# Patient Record
Sex: Female | Born: 1975 | Race: Black or African American | Hispanic: No | Marital: Single | Smoking: Former smoker
Health system: Southern US, Community
[De-identification: ages and names within clinical notes are randomized; demographics above are authoritative.]

## PROBLEM LIST (undated history)

## (undated) DIAGNOSIS — D649 Anemia, unspecified: Secondary | ICD-10-CM

## (undated) DIAGNOSIS — E119 Type 2 diabetes mellitus without complications: Secondary | ICD-10-CM

## (undated) DIAGNOSIS — K219 Gastro-esophageal reflux disease without esophagitis: Secondary | ICD-10-CM

## (undated) DIAGNOSIS — I10 Essential (primary) hypertension: Secondary | ICD-10-CM

## (undated) DIAGNOSIS — M545 Low back pain, unspecified: Secondary | ICD-10-CM

## (undated) DIAGNOSIS — D519 Vitamin B12 deficiency anemia, unspecified: Secondary | ICD-10-CM

## (undated) DIAGNOSIS — M1710 Unilateral primary osteoarthritis, unspecified knee: Secondary | ICD-10-CM

## (undated) DIAGNOSIS — G931 Anoxic brain damage, not elsewhere classified: Secondary | ICD-10-CM

## (undated) DIAGNOSIS — Z95810 Presence of automatic (implantable) cardiac defibrillator: Secondary | ICD-10-CM

## (undated) DIAGNOSIS — Z9289 Personal history of other medical treatment: Secondary | ICD-10-CM

## (undated) DIAGNOSIS — G4730 Sleep apnea, unspecified: Secondary | ICD-10-CM

## (undated) DIAGNOSIS — J45909 Unspecified asthma, uncomplicated: Secondary | ICD-10-CM

## (undated) DIAGNOSIS — R519 Headache, unspecified: Secondary | ICD-10-CM

## (undated) DIAGNOSIS — M179 Osteoarthritis of knee, unspecified: Secondary | ICD-10-CM

## (undated) DIAGNOSIS — G8929 Other chronic pain: Secondary | ICD-10-CM

## (undated) DIAGNOSIS — G43909 Migraine, unspecified, not intractable, without status migrainosus: Secondary | ICD-10-CM

## (undated) DIAGNOSIS — R7989 Other specified abnormal findings of blood chemistry: Secondary | ICD-10-CM

## (undated) DIAGNOSIS — K760 Fatty (change of) liver, not elsewhere classified: Secondary | ICD-10-CM

## (undated) DIAGNOSIS — J42 Unspecified chronic bronchitis: Secondary | ICD-10-CM

## (undated) DIAGNOSIS — I469 Cardiac arrest, cause unspecified: Secondary | ICD-10-CM

## (undated) DIAGNOSIS — K4090 Unilateral inguinal hernia, without obstruction or gangrene, not specified as recurrent: Secondary | ICD-10-CM

## (undated) DIAGNOSIS — K222 Esophageal obstruction: Secondary | ICD-10-CM

## (undated) DIAGNOSIS — E538 Deficiency of other specified B group vitamins: Secondary | ICD-10-CM

## (undated) DIAGNOSIS — M1990 Unspecified osteoarthritis, unspecified site: Secondary | ICD-10-CM

## (undated) DIAGNOSIS — R51 Headache: Secondary | ICD-10-CM

## (undated) DIAGNOSIS — I480 Paroxysmal atrial fibrillation: Secondary | ICD-10-CM

## (undated) HISTORY — DX: Fatty (change of) liver, not elsewhere classified: K76.0

## (undated) HISTORY — PX: INGUINAL HERNIA REPAIR: SUR1180

## (undated) HISTORY — DX: Paroxysmal atrial fibrillation: I48.0

## (undated) HISTORY — DX: Unspecified osteoarthritis, unspecified site: M19.90

## (undated) HISTORY — PX: ABDOMINAL HYSTERECTOMY: SHX81

## (undated) HISTORY — PX: KNEE CARTILAGE SURGERY: SHX688

## (undated) HISTORY — DX: Low back pain: M54.5

## (undated) HISTORY — DX: Low back pain, unspecified: M54.50

---

## 2000-04-16 HISTORY — PX: TUBAL LIGATION: SHX77

## 2008-05-05 ENCOUNTER — Ambulatory Visit: Payer: Self-pay | Admitting: Diagnostic Radiology

## 2008-05-05 ENCOUNTER — Emergency Department (HOSPITAL_BASED_OUTPATIENT_CLINIC_OR_DEPARTMENT_OTHER): Admission: EM | Admit: 2008-05-05 | Discharge: 2008-05-05 | Payer: Self-pay | Admitting: Emergency Medicine

## 2008-08-02 ENCOUNTER — Emergency Department (HOSPITAL_BASED_OUTPATIENT_CLINIC_OR_DEPARTMENT_OTHER): Admission: EM | Admit: 2008-08-02 | Discharge: 2008-08-02 | Payer: Self-pay | Admitting: Emergency Medicine

## 2012-10-04 ENCOUNTER — Emergency Department (HOSPITAL_BASED_OUTPATIENT_CLINIC_OR_DEPARTMENT_OTHER)
Admission: EM | Admit: 2012-10-04 | Discharge: 2012-10-04 | Disposition: A | Discharge status: Home / Self Care | Payer: Self-pay | Attending: Emergency Medicine | Admitting: Emergency Medicine

## 2012-10-04 DIAGNOSIS — Y9389 Activity, other specified: Secondary | ICD-10-CM | POA: Insufficient documentation

## 2012-10-04 DIAGNOSIS — R0789 Other chest pain: Secondary | ICD-10-CM | POA: Insufficient documentation

## 2012-10-04 DIAGNOSIS — T782XXA Anaphylactic shock, unspecified, initial encounter: Secondary | ICD-10-CM | POA: Insufficient documentation

## 2012-10-04 DIAGNOSIS — J029 Acute pharyngitis, unspecified: Secondary | ICD-10-CM | POA: Insufficient documentation

## 2012-10-04 DIAGNOSIS — R000 Tachycardia, unspecified: Secondary | ICD-10-CM | POA: Insufficient documentation

## 2012-10-04 DIAGNOSIS — L299 Pruritus, unspecified: Secondary | ICD-10-CM | POA: Insufficient documentation

## 2012-10-04 DIAGNOSIS — T65891A Toxic effect of other specified substances, accidental (unintentional), initial encounter: Secondary | ICD-10-CM | POA: Insufficient documentation

## 2012-10-04 DIAGNOSIS — Y929 Unspecified place or not applicable: Secondary | ICD-10-CM | POA: Insufficient documentation

## 2012-10-04 DIAGNOSIS — R1310 Dysphagia, unspecified: Secondary | ICD-10-CM | POA: Insufficient documentation

## 2012-10-04 DIAGNOSIS — F411 Generalized anxiety disorder: Secondary | ICD-10-CM | POA: Insufficient documentation

## 2012-10-04 MED ORDER — FAMOTIDINE IN NACL 20-0.9 MG/50ML-% IV SOLN
INTRAVENOUS | Status: AC
  Administered 2012-10-04: 20 mg via INTRAVENOUS
  Filled 2012-10-04: qty 50

## 2012-10-04 MED ORDER — FAMOTIDINE IN NACL 20-0.9 MG/50ML-% IV SOLN
20.0000 mg | Freq: Once | INTRAVENOUS | Status: AC
  Administered 2012-10-04: 20 mg via INTRAVENOUS

## 2012-10-04 MED ORDER — MINERAL OIL RE ENEM
1.0000 | ENEMA | Freq: Once | RECTAL | Status: AC
  Administered 2012-10-04: 1 via RECTAL

## 2012-10-04 MED ORDER — PREDNISONE (PAK) 10 MG PO TABS: 10.0000 mg | ORAL_TABLET | Freq: Every day | ORAL | Status: DC

## 2012-10-04 MED ORDER — RANITIDINE HCL 150 MG PO TABS: 150.0000 mg | ORAL_TABLET | Freq: Two times a day (BID) | ORAL | Status: DC

## 2012-10-04 MED ORDER — EPINEPHRINE 0.3 MG/0.3ML IJ SOAJ: 0.3000 mg | INTRAMUSCULAR | Status: DC | PRN

## 2012-10-04 MED ORDER — MINERAL OIL RE ENEM
ENEMA | RECTAL | Status: AC
  Administered 2012-10-04: 1 via RECTAL
  Filled 2012-10-04: qty 1

## 2012-10-04 MED ORDER — HYDROXYZINE HCL 25 MG PO TABS
ORAL_TABLET | ORAL | Status: AC
  Administered 2012-10-04: 25 mg via ORAL
  Filled 2012-10-04: qty 1

## 2012-10-04 MED ORDER — EPINEPHRINE 0.3 MG/0.3ML IJ SOAJ
INTRAMUSCULAR | Status: AC
  Administered 2012-10-04: 0.3 mg via SUBCUTANEOUS
  Filled 2012-10-04: qty 0.3

## 2012-10-04 MED ORDER — HYDROXYZINE HCL 50 MG/ML IM SOLN
25.0000 mg | Freq: Once | INTRAMUSCULAR | Status: DC
  Filled 2012-10-04: qty 0.5

## 2012-10-04 MED ORDER — HYDROXYZINE HCL 25 MG PO TABS
25.0000 mg | ORAL_TABLET | Freq: Once | ORAL | Status: AC
  Administered 2012-10-04: 25 mg via ORAL

## 2012-10-04 MED ORDER — METHYLPREDNISOLONE SODIUM SUCC 125 MG IJ SOLR
INTRAMUSCULAR | Status: AC
  Administered 2012-10-04: 125 mg via INTRAVENOUS
  Filled 2012-10-04: qty 2

## 2012-10-04 MED ORDER — SODIUM CHLORIDE 0.9 % IV SOLN
Freq: Once | INTRAVENOUS | Status: AC
  Administered 2012-10-04: 18:00:00 via INTRAVENOUS

## 2012-10-04 MED ORDER — DIPHENHYDRAMINE HCL 50 MG/ML IJ SOLN
INTRAMUSCULAR | Status: AC
  Filled 2012-10-04: qty 1

## 2012-10-04 MED ORDER — METHYLPREDNISOLONE SODIUM SUCC 125 MG IJ SOLR
125.0000 mg | Freq: Once | INTRAMUSCULAR | Status: AC
  Administered 2012-10-04: 125 mg via INTRAVENOUS

## 2012-10-04 MED ORDER — DIPHENHYDRAMINE HCL 50 MG/ML IJ SOLN: 25.0000 mg | Freq: Once | INTRAMUSCULAR | Status: DC

## 2012-10-04 MED ORDER — DIPHENHYDRAMINE HCL 25 MG PO TABS: 25.0000 mg | ORAL_TABLET | Freq: Four times a day (QID) | ORAL | Status: DC

## 2012-10-04 NOTE — ED Provider Notes (Signed)
History     CSN: [id number]  Arrival date & time 10/04/12  50   First MD Initiated Contact with Patient 10/04/12 1725      Chief Complaint  Patient presents with  . Allergic Reaction    (Consider location/radiation/quality/duration/timing/severity/associated sxs/prior treatment) Patient is a 37 y.o. female presenting with allergic reaction. The history is provided by the patient.  Allergic Reaction Presenting symptoms: difficulty breathing, difficulty swallowing and itching   Presenting symptoms: no rash   Severity:  Moderate Prior allergic episodes:  No prior episodes Context: chemicals   Context comment:  Hair products Relieved by:  Nothing Worsened by:  Nothing tried Ineffective treatments:  Antihistamines  Courtney Davies is a 37 y.o. female who presents to the ED with shortness of breath and feeling like her throat was tight. 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 She took 3 Benadryl 25 mg without relief so she came here  No past medical history on file.  No past surgical history on file.  No family history on file.  History  Substance Use Topics  . Smoking status: Not on file  . Smokeless tobacco: Not on file  . Alcohol Use: Not on file    OB History   No data available      Review of Systems  Constitutional: Negative for fever.  HENT: Positive for sore throat (tight) and trouble swallowing.        Difficulty speaking  Respiratory: Positive for chest tightness and shortness of breath.   Gastrointestinal: Negative for nausea and vomiting.  Skin: Positive for itching. Negative for rash.  Neurological: Negative for syncope and headaches.  Psychiatric/Behavioral: The patient is nervous/anxious.     Allergies  Review of patient's allergies indicates not on file.  Home Medications  No current outpatient prescriptions on file.  BP 124/64  Pulse 121  Temp(Src) 98 F (36.7 C) (Oral)  Resp 30   SpO2 100%  Physical Exam  Nursing note and vitals reviewed. Constitutional: She is oriented to person, place, and time. She appears well-developed and well-nourished. She appears distressed. She is not intubated.  Mild respiratory distress.  HENT:  Head: Normocephalic.  Mouth/Throat: Uvula is midline, oropharynx is clear and moist and mucous membranes are normal.  Eyes: Conjunctivae and EOM are normal.  Neck: Normal range of motion. Neck supple.  Cardiovascular: Tachycardia present.   Pulmonary/Chest: Accessory muscle usage present. No apnea. She is not intubated. She is in respiratory distress. She has decreased breath sounds.  Musculoskeletal: Normal range of motion.  Neurological: She is alert and oriented to person, place, and time. No cranial nerve deficit.  Skin: Skin is warm and dry.  Psychiatric: Her mood appears anxious. She is noncommunicative.  Patient with difficulty speaking due to shortness of breath and severe allergic reaction.    ED Course  CRITICAL CARE Performed by: Ashley Murrain Authorized by: Ashley Murrain Total critical care time: 45 minutes Critical care was necessary to treat or prevent imminent or life-threatening deterioration of the following conditions: severe allergic reaction to hair dye requiring injecstion of epinephrine, IV Solumedro and Pepcid. also atarax PO, serial evaluations by Dr. Monia Pouch and me.  Critical care was time spent personally by me on the following activities: examination of patient, obtaining history from patient or surrogate, ordering and performing treatments and interventions, pulse oximetry, re-evaluation of patient's condition and evaluation of patient's response to treatment.   (including critical  care time) IV Solumedrol 125 mg., Pepcid 20 mg. IV and Epinephrine 0.3 ccm SQ, continuous O2 sat and vital signs.  17:35 re evaluation some improvement after medications, continue to observe 17:55 patient continues to have itching  will give Atarax PO 18:40 patient feeling much better and states she is ready to go home.  Re examined. No wheezing, no shortness of breath, no tachycardia.  Will d/c patient home with detailed instructions.   MDM  37 y.o. 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 Discussed in detail with the patient severe allergic reaction and plan of care. She voices understanding. She will return immediately for any respiratory problems.    Medication List    TAKE these medications       diphenhydrAMINE 25 MG tablet  Commonly known as:  BENADRYL  Take 1 tablet (25 mg total) by mouth every 6 (six) hours.     EPINEPHrine 0.3 mg/0.3 mL Devi  Commonly known as:  EPIPEN  Inject 0.3 mLs (0.3 mg total) into the muscle as needed.     predniSONE 10 MG tablet  Commonly known as:  STERAPRED UNI-PAK  Take 1 tablet (10 mg total) by mouth daily. Take 6 tablets today then 5, 4, 3, 2, 1 for allergic reaction     ranitidine 150 MG tablet  Commonly known as:  ZANTAC  Take 1 tablet (150 mg total) by mouth 2 (two) times daily.               [place], [place] 10/04/12 1914

## 2012-10-04 NOTE — ED Notes (Signed)
Presents with all over itching and "throat tight" x 30 mins- pt was getting her hair done- took 3 benadryl pta

## 2012-10-05 NOTE — ED Provider Notes (Signed)
Medical screening examination/treatment/procedure(s) were conducted as a shared visit with non-physician practitioner(s) and myself.  I personally evaluated the patient during the encounter Pt had an allergic reaction to a chemical in the hair permanent treatment she got at the beauty shop, developing severe itching, skin redness, swelling of the face and lips, and wheezing.  She had takne 75 mg of diphenhydramine prior to coming in, without relief.  Rx with epinephrine, SoluMedrol, Pepcid, and hydroxyzine, with resolution of her symptoms at appx 90 minutes after her arrival.  Mylinda Latina III, MD 10/05/12 1329

## 2012-12-14 ENCOUNTER — Emergency Department (HOSPITAL_BASED_OUTPATIENT_CLINIC_OR_DEPARTMENT_OTHER)
Admission: EM | Admit: 2012-12-14 | Discharge: 2012-12-14 | Disposition: A | Discharge status: Home / Self Care | Payer: Self-pay | Attending: Emergency Medicine | Admitting: Emergency Medicine

## 2012-12-14 ENCOUNTER — Emergency Department (HOSPITAL_BASED_OUTPATIENT_CLINIC_OR_DEPARTMENT_OTHER): Payer: Self-pay

## 2012-12-14 ENCOUNTER — Encounter (HOSPITAL_BASED_OUTPATIENT_CLINIC_OR_DEPARTMENT_OTHER): Payer: Self-pay

## 2012-12-14 DIAGNOSIS — S93499A Sprain of other ligament of unspecified ankle, initial encounter: Secondary | ICD-10-CM | POA: Insufficient documentation

## 2012-12-14 DIAGNOSIS — F17200 Nicotine dependence, unspecified, uncomplicated: Secondary | ICD-10-CM | POA: Insufficient documentation

## 2012-12-14 DIAGNOSIS — IMO0002 Reserved for concepts with insufficient information to code with codable children: Secondary | ICD-10-CM | POA: Insufficient documentation

## 2012-12-14 DIAGNOSIS — X500XXA Overexertion from strenuous movement or load, initial encounter: Secondary | ICD-10-CM | POA: Insufficient documentation

## 2012-12-14 DIAGNOSIS — Z79899 Other long term (current) drug therapy: Secondary | ICD-10-CM | POA: Insufficient documentation

## 2012-12-14 DIAGNOSIS — Z791 Long term (current) use of non-steroidal anti-inflammatories (NSAID): Secondary | ICD-10-CM | POA: Insufficient documentation

## 2012-12-14 DIAGNOSIS — S86319A Strain of muscle(s) and tendon(s) of peroneal muscle group at lower leg level, unspecified leg, initial encounter: Secondary | ICD-10-CM

## 2012-12-14 DIAGNOSIS — Y9301 Activity, walking, marching and hiking: Secondary | ICD-10-CM | POA: Insufficient documentation

## 2012-12-14 DIAGNOSIS — Y929 Unspecified place or not applicable: Secondary | ICD-10-CM | POA: Insufficient documentation

## 2012-12-14 MED ORDER — NAPROXEN 500 MG PO TABS: 500.0000 mg | ORAL_TABLET | Freq: Two times a day (BID) | ORAL | Status: DC

## 2012-12-14 MED ORDER — HYDROCODONE-ACETAMINOPHEN 5-325 MG PO TABS: 2.0000 | ORAL_TABLET | ORAL | Status: DC | PRN

## 2012-12-14 NOTE — ED Notes (Signed)
Sudden onset of right left pain from knee down to ankle. Was walking when pain occurred, "heard something popped". Pain 7/10 tight, aggravate by movement, relieve with sitting. Limited ROM on right leg, Very tender and painful to touch at the knee, calf and right ankle. No relief by ibuprofen and muscle relaxer (type unknown). Right leg do appear swollen compared to left.

## 2012-12-14 NOTE — ED Provider Notes (Signed)
CSN: [id number]     Arrival date & time 12/14/12  9476 History   First MD Initiated Contact with Patient 12/14/12 1006     Chief Complaint  Patient presents with  . Leg Pain   Patient was simply walking on a flat surface. She felt a sudden pain in the posterior aspect of her right calf. She heard a loud snap and she is able to walk. She can flex and extend her ankle. She is painful and tender in the proximal calf HPI  History reviewed. No pertinent past medical history. Past Surgical History  Procedure Laterality Date  . Cesarean section     No family history on file. History  Substance Use Topics  . Smoking status: Current Every Day Smoker  . Smokeless tobacco: Not on file  . Alcohol Use: No   OB History   Grav Para Term Preterm Abortions TAB SAB Ect Mult Living                 Review of Systems  Musculoskeletal:       Pain and tenderness in the right calf.  No pain at the Achilles    Allergies  Codeine  Home Medications   Current Outpatient Rx  Name  Route  Sig  Dispense  Refill  . diphenhydrAMINE (BENADRYL) 25 MG tablet   Oral   Take 1 tablet (25 mg total) by mouth every 6 (six) hours.   20 tablet   0   . EPINEPHrine (EPIPEN) 0.3 mg/0.3 mL DEVI   Intramuscular   Inject 0.3 mLs (0.3 mg total) into the muscle as needed.   1 Device   0   . HYDROcodone-acetaminophen (NORCO/VICODIN) 5-325 MG per tablet   Oral   Take 2 tablets by mouth every 4 (four) hours as needed for pain.   10 tablet   0   . naproxen (NAPROSYN) 500 MG tablet   Oral   Take 1 tablet (500 mg total) by mouth 2 (two) times daily.   30 tablet   0   . predniSONE (STERAPRED UNI-PAK) 10 MG tablet   Oral   Take 1 tablet (10 mg total) by mouth daily. Take 6 tablets today then 5, 4, 3, 2, 1 for allergic reaction   21 tablet   0   . ranitidine (ZANTAC) 150 MG tablet   Oral   Take 1 tablet (150 mg total) by mouth 2 (two) times daily.   60 tablet   0    BP 123/72  Pulse 87  Temp(Src)  95.5 F (35.3 C) (Oral)  Resp 18  SpO2 99%  LMP 12/03/2012 Physical Exam  Musculoskeletal:  Her plantarflexion and dorsiflexion of the right ankle are intact. She has some tenderness in the proximal posterior calf. Her perfusion distally is intact. She has no swelling. She has pain with passive dorsiflexion at the ankle. This pain reproduces at the proximal right calf musculature.    ED Course  Procedures (including critical care time) Labs Review Labs Reviewed - No data to display Imaging Review Dg Ankle Complete Right  12/14/2012   *RADIOLOGY REPORT*  Clinical Data: Right ankle pain  RIGHT ANKLE - COMPLETE 3+ VIEW  Comparison: None.  Findings: No acute fracture or dislocation is noted.  No gross soft tissue abnormality is noted.  IMPRESSION: No acute abnormality noted.   Original Report Authenticated By: Inez Catalina, M.D.   Dg Knee Complete 4 Views Right  12/14/2012   *RADIOLOGY REPORT*  Clinical Data: Right  knee pain  RIGHT KNEE - COMPLETE 4+ VIEW  Comparison: None.  Findings: No acute fracture or dislocation is noted.  No gross soft tissue abnormality is seen.  IMPRESSION: No acute abnormality is noted.   Original Report Authenticated By: Inez Catalina, M.D.    MDM   1. Peroneal tendon rupture, initial encounter    Ice, crutches, primary care followup. Pain control, pain medications.    Lolita Patella, MD 12/14/12 [phone]

## 2012-12-19 ENCOUNTER — Encounter (HOSPITAL_BASED_OUTPATIENT_CLINIC_OR_DEPARTMENT_OTHER): Payer: Self-pay | Admitting: *Deleted

## 2012-12-19 ENCOUNTER — Emergency Department (HOSPITAL_BASED_OUTPATIENT_CLINIC_OR_DEPARTMENT_OTHER)
Admission: EM | Admit: 2012-12-19 | Discharge: 2012-12-19 | Disposition: A | Discharge status: Home / Self Care | Payer: Self-pay | Attending: Emergency Medicine | Admitting: Emergency Medicine

## 2012-12-19 DIAGNOSIS — H04301 Unspecified dacryocystitis of right lacrimal passage: Secondary | ICD-10-CM

## 2012-12-19 DIAGNOSIS — Z79899 Other long term (current) drug therapy: Secondary | ICD-10-CM | POA: Insufficient documentation

## 2012-12-19 DIAGNOSIS — H04309 Unspecified dacryocystitis of unspecified lacrimal passage: Secondary | ICD-10-CM | POA: Insufficient documentation

## 2012-12-19 DIAGNOSIS — F17200 Nicotine dependence, unspecified, uncomplicated: Secondary | ICD-10-CM | POA: Insufficient documentation

## 2012-12-19 MED ORDER — ERYTHROMYCIN 5 MG/GM OP OINT: TOPICAL_OINTMENT | OPHTHALMIC | Status: DC

## 2012-12-19 MED ORDER — ERYTHROMYCIN BASE 250 MG PO TABS: 250.0000 mg | ORAL_TABLET | Freq: Four times a day (QID) | ORAL | Status: DC

## 2012-12-19 MED ORDER — ERYTHROMYCIN 5 MG/GM OP OINT
TOPICAL_OINTMENT | Freq: Once | OPHTHALMIC | Status: AC
  Administered 2012-12-19: 0.5 via OPHTHALMIC
  Filled 2012-12-19: qty 3.5

## 2012-12-19 MED ORDER — NAPROXEN 500 MG PO TABS: 500.0000 mg | ORAL_TABLET | Freq: Two times a day (BID) | ORAL | Status: DC

## 2012-12-19 NOTE — ED Provider Notes (Signed)
CSN: [id number]     Arrival date & time 12/19/12  2005 History  First MD Initiated Contact with Patient 12/19/12 2042     Chief Complaint  Patient presents with  . Eye Pain   HPI Patient states she's had a bump in the inner aspect of her left eyelid neither medial canthus for about the last year. It has never really bother her. However, in the last week she started noticing increasing swelling. She's also had increasing tenderness noted some drainage. Patient denies any trouble with fevers. She denies blurred vision History reviewed. No pertinent past medical history. Past Surgical History  Procedure Laterality Date  . Cesarean section    . Hernia repair     History reviewed. No pertinent family history. History  Substance Use Topics  . Smoking status: Current Every Day Smoker -- 0.50 packs/day    Types: Cigarettes  . Smokeless tobacco: Not on file  . Alcohol Use: No   OB History   Grav Para Term Preterm Abortions TAB SAB Ect Mult Living                 Review of Systems  All other systems reviewed and are negative.    Allergies  Codeine  Home Medications   Current Outpatient Rx  Name  Route  Sig  Dispense  Refill  . diphenhydrAMINE (BENADRYL) 25 MG tablet   Oral   Take 1 tablet (25 mg total) by mouth every 6 (six) hours.   20 tablet   0   . EPINEPHrine (EPIPEN) 0.3 mg/0.3 mL DEVI   Intramuscular   Inject 0.3 mLs (0.3 mg total) into the muscle as needed.   1 Device   0   . erythromycin (E-MYCIN) 250 MG tablet   Oral   Take 1 tablet (250 mg total) by mouth 4 (four) times daily.   28 tablet   0   . erythromycin ophthalmic ointment      Place a 1/2 inch ribbon of ointment into the lower eyelid.   3.5 g   0   . naproxen (NAPROSYN) 500 MG tablet   Oral   Take 1 tablet (500 mg total) by mouth 2 (two) times daily.   30 tablet   0   . ranitidine (ZANTAC) 150 MG tablet   Oral   Take 1 tablet (150 mg total) by mouth 2 (two) times daily.   60 tablet   0    BP 122/73  Pulse 80  Temp(Src) 98.6 F (37 C) (Oral)  Resp 20  Ht 5' 7"  (1.702 m)  Wt 210 lb (95.255 kg)  BMI 32.88 kg/m2  SpO2 100%  LMP 12/03/2012 Physical Exam  Nursing note and vitals reviewed. Constitutional: She appears well-developed and well-nourished. No distress.  HENT:  Head: Normocephalic and atraumatic.  Right Ear: External ear normal.  Left Ear: External ear normal.  Eyes: Conjunctivae are normal. Right eye exhibits no discharge. Left eye exhibits no discharge. No scleral icterus.    Neck: Neck supple. No tracheal deviation present.  Cardiovascular: Normal rate.   Pulmonary/Chest: Effort normal. No stridor. No respiratory distress.  Musculoskeletal: She exhibits no edema.  Neurological: She is alert. Cranial nerve deficit: no gross deficits.  Skin: Skin is warm and dry. No rash noted.  Psychiatric: She has a normal mood and affect.    ED Course  Procedures (including critical care time) Labs Review Labs Reviewed - No data to display Imaging Review No results found.  MDM  1. Dacrocystitis, right    Patient appears to have an area swelling and tenderness. 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 She may have a component of dacryocystitis. Patient on antibiotic eye drops and have her followup doctor.    Kathalene Frames, MD 12/19/12 2121

## 2012-12-19 NOTE — ED Notes (Signed)
Pt c/o left eye " bump" x 2 days

## 2012-12-20 ENCOUNTER — Telehealth (HOSPITAL_COMMUNITY): Payer: Self-pay | Admitting: *Deleted

## 2013-08-02 ENCOUNTER — Emergency Department (HOSPITAL_BASED_OUTPATIENT_CLINIC_OR_DEPARTMENT_OTHER)
Admission: EM | Admit: 2013-08-02 | Discharge: 2013-08-02 | Disposition: A | Discharge status: Home / Self Care | Payer: Self-pay | Attending: Emergency Medicine | Admitting: Emergency Medicine

## 2013-08-02 ENCOUNTER — Encounter (HOSPITAL_BASED_OUTPATIENT_CLINIC_OR_DEPARTMENT_OTHER): Payer: Self-pay | Admitting: Emergency Medicine

## 2013-08-02 DIAGNOSIS — Z791 Long term (current) use of non-steroidal anti-inflammatories (NSAID): Secondary | ICD-10-CM | POA: Insufficient documentation

## 2013-08-02 DIAGNOSIS — F17200 Nicotine dependence, unspecified, uncomplicated: Secondary | ICD-10-CM | POA: Insufficient documentation

## 2013-08-02 DIAGNOSIS — J45909 Unspecified asthma, uncomplicated: Secondary | ICD-10-CM | POA: Insufficient documentation

## 2013-08-02 DIAGNOSIS — R002 Palpitations: Secondary | ICD-10-CM | POA: Insufficient documentation

## 2013-08-02 DIAGNOSIS — R197 Diarrhea, unspecified: Secondary | ICD-10-CM | POA: Insufficient documentation

## 2013-08-02 DIAGNOSIS — D649 Anemia, unspecified: Secondary | ICD-10-CM

## 2013-08-02 DIAGNOSIS — R638 Other symptoms and signs concerning food and fluid intake: Secondary | ICD-10-CM | POA: Insufficient documentation

## 2013-08-02 DIAGNOSIS — Z79899 Other long term (current) drug therapy: Secondary | ICD-10-CM | POA: Insufficient documentation

## 2013-08-02 DIAGNOSIS — D510 Vitamin B12 deficiency anemia due to intrinsic factor deficiency: Secondary | ICD-10-CM | POA: Insufficient documentation

## 2013-08-02 DIAGNOSIS — Z792 Long term (current) use of antibiotics: Secondary | ICD-10-CM | POA: Insufficient documentation

## 2013-08-02 HISTORY — DX: Unspecified asthma, uncomplicated: J45.909

## 2013-08-02 LAB — CBC WITH DIFFERENTIAL/PLATELET
BASOS PCT: 0 % (ref 0–1)
Basophils Absolute: 0 10*3/uL (ref 0.0–0.1)
EOS ABS: 0.2 10*3/uL (ref 0.0–0.7)
EOS PCT: 3 % (ref 0–5)
HEMATOCRIT: 25.9 % — AB (ref 36.0–46.0)
HEMOGLOBIN: 9 g/dL — AB (ref 12.0–15.0)
Lymphocytes Relative: 51 % — ABNORMAL HIGH (ref 12–46)
Lymphs Abs: 4.2 10*3/uL — ABNORMAL HIGH (ref 0.7–4.0)
MCH: 30.5 pg (ref 26.0–34.0)
MCHC: 34.7 g/dL (ref 30.0–36.0)
MCV: 87.8 fL (ref 78.0–100.0)
MONO ABS: 0.5 10*3/uL (ref 0.1–1.0)
MONOS PCT: 6 % (ref 3–12)
Neutro Abs: 3.4 10*3/uL (ref 1.7–7.7)
Neutrophils Relative %: 41 % — ABNORMAL LOW (ref 43–77)
Platelets: 267 10*3/uL (ref 150–400)
RBC: 2.95 MIL/uL — ABNORMAL LOW (ref 3.87–5.11)
RDW: 19.3 % — AB (ref 11.5–15.5)
WBC: 8.4 10*3/uL (ref 4.0–10.5)

## 2013-08-02 LAB — URINALYSIS, ROUTINE W REFLEX MICROSCOPIC
Bilirubin Urine: NEGATIVE
Glucose, UA: NEGATIVE mg/dL
Hgb urine dipstick: NEGATIVE
KETONES UR: NEGATIVE mg/dL
LEUKOCYTES UA: NEGATIVE
NITRITE: NEGATIVE
PH: 7 (ref 5.0–8.0)
Protein, ur: NEGATIVE mg/dL
SPECIFIC GRAVITY, URINE: 1.018 (ref 1.005–1.030)
Urobilinogen, UA: 1 mg/dL (ref 0.0–1.0)

## 2013-08-02 LAB — COMPREHENSIVE METABOLIC PANEL
ALBUMIN: 3.7 g/dL (ref 3.5–5.2)
ALT: 13 U/L (ref 0–35)
AST: 15 U/L (ref 0–37)
Alkaline Phosphatase: 73 U/L (ref 39–117)
BILIRUBIN TOTAL: 0.2 mg/dL — AB (ref 0.3–1.2)
BUN: 13 mg/dL (ref 6–23)
CALCIUM: 9.2 mg/dL (ref 8.4–10.5)
CO2: 21 mEq/L (ref 19–32)
CREATININE: 0.7 mg/dL (ref 0.50–1.10)
Chloride: 109 mEq/L (ref 96–112)
GFR calc Af Amer: >90 mL/min (ref >90)
GFR calc non Af Amer: >90 mL/min (ref >90)
Glucose, Bld: 97 mg/dL (ref 70–99)
Potassium: 3.8 mEq/L (ref 3.7–5.3)
Sodium: 142 mEq/L (ref 137–147)
TOTAL PROTEIN: 6.9 g/dL (ref 6.0–8.3)

## 2013-08-02 LAB — LIPASE, BLOOD: LIPASE: 37 U/L (ref 11–59)

## 2013-08-02 NOTE — ED Notes (Signed)
Pt reports feeling dizziness after feeling her heart beating in her chest

## 2013-08-02 NOTE — ED Provider Notes (Signed)
CSN: [id number]     Arrival date & time 08/02/13  2115 History  This chart was scribed for Shaune Pollack, MD by Zettie Pho, ED Scribe. This patient was seen in room MH07/MH07 and the patient's care was started at 9:23 PM.    Chief Complaint  Patient presents with  . Dizziness   Patient is a 38 y.o. female presenting with palpitations. The history is provided by the patient. No language interpreter was used.  Palpitations Palpitations quality:  Fast Timing:  Intermittent Progression:  Unchanged Chronicity:  New Context: nicotine   Relieved by: ibuprofen. Worsened by:  Nothing tried Associated symptoms: dizziness   Associated symptoms: no vomiting    HPI Comments: DIANEY SUCHY is a 38 y.o. female who presents to the Emergency Department complaining of intermittent palpitations, described as "beating really hard," with associated dizziness and lightheadedness onset earlier today. She reports taking ibuprofen at home with mild, temporary relief of her symptoms. Patient reports some watery diarrhea with associated decreased appetite over the past few days. She denies vomiting. Patient has an allergy to codeine. Patient has a history of asthma, but denies daily medication use. Patient is a current every day smoker, 0.5 PPD, but does not drink. She denies the possibility of being pregnant.   Past Medical History  Diagnosis Date  . Asthma    Past Surgical History  Procedure Laterality Date  . Cesarean section    . Hernia repair     History reviewed. No pertinent family history. History  Substance Use Topics  . Smoking status: Current Every Day Smoker -- 0.50 packs/day    Types: Cigarettes  . Smokeless tobacco: Never Used  . Alcohol Use: No   OB History   Grav Para Term Preterm Abortions TAB SAB Ect Mult Living                 Review of Systems  Constitutional: Positive for appetite change (decreased).  Cardiovascular: Positive for palpitations.  Gastrointestinal: Positive  for diarrhea. Negative for vomiting.  Neurological: Positive for dizziness and light-headedness.  All other systems reviewed and are negative.   Allergies  Codeine  Home Medications   Prior to Admission medications   Medication Sig Start Date End Date Taking? Authorizing Provider  diphenhydrAMINE (BENADRYL) 25 MG tablet Take 1 tablet (25 mg total) by mouth every 6 (six) hours. 10/04/12   Hope Bunnie Pion, NP  EPINEPHrine (EPIPEN) 0.3 mg/0.3 mL DEVI Inject 0.3 mLs (0.3 mg total) into the muscle as needed. 10/04/12   Hope Bunnie Pion, NP  erythromycin (E-MYCIN) 250 MG tablet Take 1 tablet (250 mg total) by mouth 4 (four) times daily. 12/19/12   Kathalene Frames, MD  erythromycin ophthalmic ointment Place a 1/2 inch ribbon of ointment into the lower eyelid. 12/19/12   Kathalene Frames, MD  naproxen (NAPROSYN) 500 MG tablet Take 1 tablet (500 mg total) by mouth 2 (two) times daily. 12/19/12   Kathalene Frames, MD  ranitidine (ZANTAC) 150 MG tablet Take 1 tablet (150 mg total) by mouth 2 (two) times daily. 10/04/12   Hope Bunnie Pion, NP   Triage Vitals: BP 150/80  Pulse 86  Temp(Src) 98.3 F (36.8 C)  Resp 18  SpO2 100%  LMP 12/03/2012  Physical Exam  Nursing note and vitals reviewed. Constitutional: She is oriented to person, place, and time. She appears well-developed and well-nourished. No distress.  HENT:  Head: Normocephalic and atraumatic.  Eyes: Conjunctivae are normal.  Neck: Normal range  of motion. Neck supple.  Cardiovascular: Normal rate, regular rhythm and normal heart sounds.   Pulmonary/Chest: Effort normal. No respiratory distress.  Few, scattered rhonchi.   Abdominal: She exhibits no distension.  Musculoskeletal: Normal range of motion.  Neurological: She is alert and oriented to person, place, and time.  Skin: Skin is warm and dry.  Psychiatric: She has a normal mood and affect. Her behavior is normal.    ED Course  Procedures (including critical care time)  DIAGNOSTIC STUDIES: Oxygen  Saturation is 100% on room air, normal by my interpretation.    COORDINATION OF CARE: 9:27 PM- Will order an EKG, CBC, CMP, lipase, and UA. Discussed treatment plan with patient at bedside and patient verbalized agreement.     Labs Review Labs Reviewed  CBC WITH DIFFERENTIAL - Abnormal; Notable for the following:    RBC 2.95 (*)    Hemoglobin 9.0 (*)    HCT 25.9 (*)    RDW 19.3 (*)    Neutrophils Relative % 41 (*)    Lymphocytes Relative 51 (*)    Lymphs Abs 4.2 (*)    All other components within normal limits  COMPREHENSIVE METABOLIC PANEL - Abnormal; Notable for the following:    Total Bilirubin 0.2 (*)    All other components within normal limits  LIPASE, BLOOD  URINALYSIS, ROUTINE W REFLEX MICROSCOPIC    Imaging Review No results found.   EKG Interpretation   Date/Time:  Sunday August 02 2013 21:31:24 EDT Ventricular Rate:  83 PR Interval:  152 QRS Duration: 78 QT Interval:  368 QTC Calculation: 432 R Axis:   74 Text Interpretation:  Sinus rhythm with marked sinus arrhythmia with  occasional Premature ventricular complexes Poor R wave progression  Abnormal ECG Confirmed by Camila Maita MD, Andee Poles ([id number]) on 08/02/2013 10:04:32  PM      MDM   Final diagnoses:  None    38 y.o. Female with history of pernicious anemia, not currently being treated, but with history of need for transfusion presents with weakness and palpitations.  She may have some volume depletion by orthostatic vital signs and is encouraged to increase po.  Hgb is low at 9 but unlikely to be causing significant symptoms- she reports no history of bleeding, she is on depo, and the anemia is likely long standing.  She is advised regarding need for follow up and return precautions and voices understanding.   I personally performed the services described in this documentation, which was scribed in my presence. The recorded information has been reviewed and considered.     Shaune Pollack, MD 08/02/13  2229

## 2013-08-02 NOTE — Discharge Instructions (Signed)
Emergency Department Resource Guide 1) Find a Doctor and Pay Out of Pocket Although you won't have to find out who is covered by your insurance plan, it is a good idea to ask around and get recommendations. You will then need to call the office and see if the doctor you have chosen will accept you as a new patient and what types of options they offer for patients who are self-pay. Some doctors offer discounts or will set up payment plans for their patients who do not have insurance, but you will need to ask so you aren't surprised when you get to your appointment.  2) Contact Your Local Health Department Not all health departments have doctors that can see patients for sick visits, but many do, so it is worth a call to see if yours does. If you don't know where your local health department is, you can check in your phone book. The CDC also has a tool to help you locate your state's health department, and many state websites also have listings of all of their local health departments.  3) Find a [facility] If your illness is not likely to be very severe or complicated, you may want to try a walk in clinic. These are popping up all over the country in pharmacies, drugstores, and shopping centers. They're usually staffed by nurse practitioners or physician assistants that have been trained to treat common illnesses and complaints. They're usually fairly quick and inexpensive. However, if you have serious medical issues or chronic medical problems, these are probably not your best option.  No Primary Care Doctor: - Call Health Connect at  [phone] - they can help you locate a primary care doctor that  accepts your insurance, provides certain services, etc. - Physician Referral Service- [phone]  Chronic Pain Problems: Organization         Address  Phone   Notes  [facility]  [phone] Patients need to be referred by their primary care doctor.   Medication  Assistance: Organization         Address  Phone   Notes  [facility] Medication [facility] [address] [place], [place] [address] [phone] --Must be a resident of [facility] -- Must have NO insurance coverage whatsoever (no Medicaid/ Medicare, etc.) -- The pt. MUST have a primary care doctor that directs their care regularly and follows them in the community   [facility]  [phone]   [employer]  [phone]    Agencies that provide inexpensive medical care: Organization         Address  Phone   Notes  [place]  [phone]   [facility]    [phone]   [facility] [place], [place] [address] [phone]   [place] [address] [place] [phone]   Planned Parenthood    [phone]   [facility]    [phone]   [place] and [place] [address], [place] Phone:  [phone], Fax:  [phone] Hours of Operation:  9 am - 6 pm, M-F.  Also accepts Medicaid/Medicare and self-pay.  [facility] for [place], [address], [place] Phone: [phone], Fax: [phone]. Hours of Operation:  8:30 am - 5:30 pm, M-F.  Also accepts Medicaid and self-pay.  [facility] [address], [place] Phone: [phone]   [place], [place], [place] [phone], Ext. [phone] Mondays & Thursdays: 7-9 AM.  First 15 patients are seen on a first come, first serve basis.    [place] Providers:  Organization         Address  Phone   Notes  [facility] [address], [place] [phone] Also accepts self-pay patients.  [facility] [address], [place]  [phone]   [place], [address], [place]  [phone]   [facility] [address], [place] [phone]   Lucianne Lei [address], [place]   [phone] Only accepts [place] Access [place] patients after they have their name applied to their card.   Self-Pay (no insurance) in [facility]:  Organization         Address  Phone   Notes  Sickle Cell Patients, [facility] [place] [phone]   [facility] [place] [phone]   [facility] [place], [place], [place] [phone]   [facility]/Dr. Osei-Bonsu  [address] [place] or [address], [place] [phone] Phone number for both [place] and [place] locations is the same.  Urgent Medical and [facility] [address], [place] [phone]   [facility] [address], [place] or [address] [phone]   [facility] [address] [place] [phone], phone; [phone], fax Sees patients 1st and 3rd Saturday of every month.  Must not qualify for public or private insurance (i.e. Medicaid, Medicare, [place] Health Choice, Veterans' Benefits)  Household income should be no more than 200% of the poverty level The clinic cannot treat you if you are pregnant or think you are pregnant  Sexually transmitted diseases are not treated at the clinic.    Dental Care: Organization         Address  Phone  Notes  [facility] Department of [facility] [place] [phone] Accepts children up to age 72 who are enrolled in [place] or [place]; pregnant women with a Medicaid card; and children who have applied for Medicaid or [place] Health Choice, but were declined, whose parents can pay a reduced fee at time of service.  [facility]  Department of [facility]  [address], [place] [phone] Accepts children up to age 108 who are enrolled in [place] or [place]; pregnant women with a Medicaid card; and children who have applied for Medicaid or [place] Health Choice, but were declined, whose parents can pay a reduced fee at time of service.  [facility] Access PROGRAM  [place] [phone] Patients are seen by appointment only. Walk-ins are not accepted. [place] will see patients 66 years of age and older. Monday - Tuesday (8am-5pm) Most Wednesdays (8:30-5pm) $30 per visit, cash only  [facility] Adult Dental Access PROGRAM  [address], [facility] [phone] Patients are seen by appointment only. Walk-ins are not accepted. [place] will see patients 13 years of age and older. One  Wednesday Evening (Monthly: Volunteer Based).  $30 per visit, cash only  Center  [phone] for adults; Children under age 56, call Graduate Pediatric Dentistry at [phone]. Children aged 63-14, please call [phone] to request a pediatric application.  Dental services are provided in all areas of dental care including fillings, crowns and bridges, complete and partial dentures, implants, gum treatment, root canals, and extractions. Preventive care is also provided. Treatment is provided to both adults and children. Patients are selected via a lottery and there is often a waiting list.   [facility] [address], [place]  [phone] [email or web address]   [facility] [address] [place], [place] [phone], Ext. [phone] Second and Fourth Thursday of each month, opens at 6:30 AM; Clinic ends at 9 AM.  Patients are seen on a first-come first-served basis, and a limited number are seen during each clinic.   [facility]  [address] Hillard Danker [place], [place] [phone]    Eligibility Requirements You must have lived in [place], [place], or [place] counties for at least the last three months.   You cannot be eligible for state or federal sponsored [employer], including [employer], [place], or [employer].   You generally cannot be eligible for healthcare insurance through your employer.    How to apply: Eligibility screenings are held every Tuesday and Wednesday afternoon from 1:00 pm until 4:00 pm. You do not need an appointment for the interview!  [facility] [address], [place], [place]   [place]  [place] Department  [place]  [phone]    Behavioral Health Resources in the Community: Intensive Outpatient Programs Organization         Address  Phone  Notes  [place]. [address], [place] [phone]   [facility] [address] [place], [place]   ADS: [facility] [address] [place], [place]   [place] [address]  [place], [place] or [phone]   Substance Abuse Resources Organization         Address  Phone  Notes  Alcohol and Drug Services  [phone]   [place]  [phone]   [facility] [place]   Chinita Pester  [phone]   Residential & Outpatient Substance Abuse Program  [phone]   Psychological Services Organization         Address  Phone  Notes  [facility]  [place]  [phone]   [place] [address], [place] or [phone]    Mobile Crisis Teams Organization         Address  Phone  Notes  [facility], Mobile Crisis Care Unit  [phone]   [facility]  [address]  [place], [place]   [place] [address], [place] [phone]    Self-Help/Support Groups Organization         Address  Phone             Notes  [place]. of [place] - variety of support groups  [place] Call for more information  Narcotics Anonymous (NA), [facility] [address], [employer] [place]  2 meetings at this location  Residential Treatment Programs Organization         Address  Phone  Notes  [facility] [address],    [place]  [phone]   [facility]  [address] [place] [phone], [place], [place]   [place], [place] [phone] Admissions: 8am-3pm M-F  Incentives Substance [place] [address]    [place] [employer], [place] [phone]   [facility] [address] [place], [place], [place]   [facility] [address],  [place], [place]   [facility] - Intensive Outpatient [address], [place], [place]   [facility] ([place].,  [place], [place] [phone] or [phone]   [facility] Services (RTS) [address], [place], [place] Accepts Medicaid  [facility] [address],  [place] [phone] Substance Abuse/Addiction Treatment   [facility] Organization         Address  Phone  Notes  [facility]  [phone]   Domenic Schwab, PhD [address] Arlis Porta [place], [place]   [phone] or [phone]   [place] Leon Rogers [place], [place] [phone]   [facility] [address] [place], [place] [phone] Insurance/Medicaid/sponsorship through [facility] and Families [address] [place]                                    [place], [place] [phone] [place] [address], [place] [phone]    Dr. Adele Schilder  [phone]   [facility] Dept. 1) [address]  2) [place] 3)  [place] [address], [place] [phone]  [phone]   [place] [phone] or [phone] (After Hours)        Anemia, Nonspecific Anemia is a condition in which the concentration of red blood cells or hemoglobin in the blood is below normal. Hemoglobin is a substance in red blood cells that carries oxygen to the tissues of the body. Anemia results in not enough oxygen reaching these tissues.  CAUSES  Common causes of anemia include:   Excessive bleeding. Bleeding may be internal or external. This includes excessive bleeding from periods (in women) or from the intestine.   Poor nutrition.   Chronic kidney, thyroid, and liver disease.  Bone marrow disorders that decrease red blood cell production.  Cancer and treatments for cancer.  HIV, AIDS, and their treatments.  Spleen problems that increase red blood cell destruction.  Blood disorders.  Excess destruction of red blood cells due to infection, medicines, and autoimmune disorders. SIGNS AND SYMPTOMS   Minor weakness.   Dizziness.   Headache.  Palpitations.   Shortness of breath, especially with exercise.   Paleness.  Cold sensitivity.  Indigestion.  Nausea.  Difficulty sleeping.  Difficulty concentrating. Symptoms may occur suddenly or they may develop slowly.  DIAGNOSIS  Additional blood tests are often needed. These help your health care provider determine the best treatment. Your health care provider will check your stool for blood and look for other causes of blood loss.  TREATMENT  Treatment varies depending on  the cause of the anemia. Treatment can include:   Supplements of iron, vitamin K18, or folic  acid.   Hormone medicines.   A blood transfusion. This may be needed if blood loss is severe.   Hospitalization. This may be needed if there is significant continual blood loss.   Dietary changes.  Spleen removal. HOME CARE INSTRUCTIONS Keep all follow-up appointments. It often takes many weeks to correct anemia, and having your health care provider check on your condition and your response to treatment is very important. SEEK IMMEDIATE MEDICAL CARE IF:   You develop extreme weakness, shortness of breath, or chest pain.   You become dizzy or have trouble concentrating.  You develop heavy vaginal bleeding.   You develop a rash.   You have bloody or black, tarry stools.   You faint.   You vomit up blood.   You vomit repeatedly.   You have abdominal pain.  You have a fever or persistent symptoms for more than 2 3 days.   You have a fever and your symptoms suddenly get worse.   You are dehydrated.  MAKE SURE YOU:  Understand these instructions.  Will watch your condition.  Will get help right away if you are not doing well or get worse. Document Released: 05/10/2004 Document Revised: 12/03/2012 Document Reviewed: 09/26/2012 [facility] Patient Information 2014 [place]. Palpitations  A palpitation is the feeling that your heartbeat is irregular. It may feel like your heart is fluttering or skipping a beat. It may also feel like your heart is beating faster than normal. This is usually not a serious problem. In some cases, you may need more medical tests. HOME CARE  Avoid:  Caffeine in coffee, tea, soft drinks, diet pills, and energy drinks.  Chocolate.  Alcohol.  Stop smoking if you smoke.  Reduce your stress and anxiety. Try:  A method that measures bodily functions so you can learn to control them (biofeedback).  Yoga.  Meditation.  Physical activity such as swimming, jogging, or walking.  Get plenty of rest and sleep. GET HELP  RIGHT AWAY IF:   You have chest pain.  You feel short of breath.  You have a very bad headache.  You feel dizzy or pass out (faint).  Your fast or irregular heartbeat continues after 24 hours.  Your palpitations occur more often. MAKE SURE YOU:   Understand these instructions.  Will watch your condition.  Will get help right away if you are not doing well or get worse. Document Released: 01/10/2008 Document Revised: 10/02/2011 Document Reviewed: 06/01/2011 [facility] Patient Information 2014 [place].

## 2014-02-21 ENCOUNTER — Emergency Department (HOSPITAL_BASED_OUTPATIENT_CLINIC_OR_DEPARTMENT_OTHER)
Admission: EM | Admit: 2014-02-21 | Discharge: 2014-02-21 | Disposition: A | Discharge status: Home / Self Care | Payer: Self-pay | Attending: Emergency Medicine | Admitting: Emergency Medicine

## 2014-02-21 ENCOUNTER — Encounter (HOSPITAL_BASED_OUTPATIENT_CLINIC_OR_DEPARTMENT_OTHER): Payer: Self-pay | Admitting: *Deleted

## 2014-02-21 DIAGNOSIS — Z3202 Encounter for pregnancy test, result negative: Secondary | ICD-10-CM | POA: Insufficient documentation

## 2014-02-21 DIAGNOSIS — J45909 Unspecified asthma, uncomplicated: Secondary | ICD-10-CM | POA: Insufficient documentation

## 2014-02-21 DIAGNOSIS — Z791 Long term (current) use of non-steroidal anti-inflammatories (NSAID): Secondary | ICD-10-CM | POA: Insufficient documentation

## 2014-02-21 DIAGNOSIS — Z87891 Personal history of nicotine dependence: Secondary | ICD-10-CM | POA: Insufficient documentation

## 2014-02-21 DIAGNOSIS — Z79899 Other long term (current) drug therapy: Secondary | ICD-10-CM | POA: Insufficient documentation

## 2014-02-21 DIAGNOSIS — M5431 Sciatica, right side: Secondary | ICD-10-CM | POA: Insufficient documentation

## 2014-02-21 DIAGNOSIS — Z792 Long term (current) use of antibiotics: Secondary | ICD-10-CM | POA: Insufficient documentation

## 2014-02-21 LAB — URINALYSIS, ROUTINE W REFLEX MICROSCOPIC
Bilirubin Urine: NEGATIVE
GLUCOSE, UA: NEGATIVE mg/dL
Ketones, ur: NEGATIVE mg/dL
LEUKOCYTES UA: NEGATIVE
Nitrite: NEGATIVE
PH: 7 (ref 5.0–8.0)
PROTEIN: NEGATIVE mg/dL
SPECIFIC GRAVITY, URINE: 1.017 (ref 1.005–1.030)
Urobilinogen, UA: 1 mg/dL (ref 0.0–1.0)

## 2014-02-21 LAB — URINE MICROSCOPIC-ADD ON

## 2014-02-21 LAB — PREGNANCY, URINE: Preg Test, Ur: NEGATIVE

## 2014-02-21 MED ORDER — CYCLOBENZAPRINE HCL 10 MG PO TABS
10.0000 mg | ORAL_TABLET | Freq: Once | ORAL | Status: AC
Start: 2014-02-21 — End: 2014-02-21
  Administered 2014-02-21: 10 mg via ORAL
  Filled 2014-02-21: qty 1

## 2014-02-21 MED ORDER — KETOROLAC TROMETHAMINE 60 MG/2ML IM SOLN
60.0000 mg | Freq: Once | INTRAMUSCULAR | Status: AC
  Administered 2014-02-21: 60 mg via INTRAMUSCULAR
  Filled 2014-02-21: qty 2

## 2014-02-21 MED ORDER — OXYCODONE-ACETAMINOPHEN 5-325 MG PO TABS: 1.0000 | ORAL_TABLET | ORAL | Status: DC | PRN

## 2014-02-21 MED ORDER — ONDANSETRON 4 MG PO TBDP
4.0000 mg | ORAL_TABLET | Freq: Once | ORAL | Status: AC
Start: 2014-02-21 — End: 2014-02-21
  Administered 2014-02-21: 4 mg via ORAL
  Filled 2014-02-21: qty 1

## 2014-02-21 MED ORDER — CYCLOBENZAPRINE HCL 10 MG PO TABS: 10.0000 mg | ORAL_TABLET | Freq: Two times a day (BID) | ORAL | Status: DC | PRN

## 2014-02-21 MED ORDER — PREDNISONE 20 MG PO TABS
40.0000 mg | ORAL_TABLET | Freq: Once | ORAL | Status: AC
  Administered 2014-02-21: 40 mg via ORAL
  Filled 2014-02-21: qty 2

## 2014-02-21 MED ORDER — PREDNISONE 10 MG PO TABS
20.0000 mg | ORAL_TABLET | Freq: Two times a day (BID) | ORAL | Status: DC
Start: 2014-02-21 — End: 2014-04-11

## 2014-02-21 MED ORDER — OXYCODONE-ACETAMINOPHEN 5-325 MG PO TABS
1.0000 | ORAL_TABLET | Freq: Once | ORAL | Status: AC
  Administered 2014-02-21: 1 via ORAL
  Filled 2014-02-21: qty 1

## 2014-02-21 NOTE — ED Provider Notes (Signed)
CSN: [id number]     Arrival date & time 02/21/14  1300 History   First MD Initiated Contact with Patient 02/21/14 1424     Chief Complaint  Patient presents with  . Back Pain     (Consider location/radiation/quality/duration/timing/severity/associated sxs/prior Treatment) Patient is a 38 y.o. female presenting with back pain. The history is provided by the patient.  Back Pain Location:  Lumbar spine Quality:  Aching Pain severity:  Moderate Onset quality:  Gradual Duration:  3 days Timing:  Constant Progression:  Worsening Chronicity:  New   Past Medical History  Diagnosis Date  . Asthma    Past Surgical History  Procedure Laterality Date  . Cesarean section    . Hernia repair     No family history on file. History  Substance Use Topics  . Smoking status: Former Smoker -- 0.00 packs/day    Quit date: 10/14/2013  . Smokeless tobacco: Never Used  . Alcohol Use: No   OB History    No data available     Review of Systems  Musculoskeletal: Positive for back pain.  all other systems negative    Allergies  Codeine  Home Medications   Prior to Admission medications   Medication Sig Start Date End Date Taking? Authorizing Provider  diphenhydrAMINE (BENADRYL) 25 MG tablet Take 1 tablet (25 mg total) by mouth every 6 (six) hours. 10/04/12  Yes Hope Bunnie Pion, NP  EPINEPHrine (EPIPEN) 0.3 mg/0.3 mL DEVI Inject 0.3 mLs (0.3 mg total) into the muscle as needed. 10/04/12  Yes Hope Bunnie Pion, NP  erythromycin (E-MYCIN) 250 MG tablet Take 1 tablet (250 mg total) by mouth 4 (four) times daily. 12/19/12   Dorie Rank, MD  erythromycin ophthalmic ointment Place a 1/2 inch ribbon of ointment into the lower eyelid. 12/19/12   Dorie Rank, MD  naproxen (NAPROSYN) 500 MG tablet Take 1 tablet (500 mg total) by mouth 2 (two) times daily. 12/19/12   Dorie Rank, MD  ranitidine (ZANTAC) 150 MG tablet Take 1 tablet (150 mg total) by mouth 2 (two) times daily. 10/04/12   Hope Bunnie Pion, NP   BP 132/70  mmHg  Pulse 95  Temp(Src) 99 F (37.2 C) (Oral)  Resp 24  Ht 5' 6"  (1.676 m)  Wt 201 lb (91.173 kg)  BMI 32.46 kg/m2  SpO2 100%  LMP 12/03/2012 Physical Exam  Constitutional: She is oriented to person, place, and time. She appears well-developed and well-nourished. No distress.  HENT:  Head: Normocephalic and atraumatic.  Right Ear: Tympanic membrane normal.  Left Ear: Tympanic membrane normal.  Nose: Nose normal.  Mouth/Throat: Uvula is midline, oropharynx is clear and moist and mucous membranes are normal.  Eyes: EOM are normal.  Neck: Normal range of motion. Neck supple.  Cardiovascular: Normal rate and regular rhythm.   Pulmonary/Chest: Effort normal. She has no wheezes. She has no rales.  Abdominal: Soft. Bowel sounds are normal. There is no tenderness.  Musculoskeletal: Normal range of motion.       Lumbar back: She exhibits tenderness, pain and spasm. She exhibits normal pulse.       Back:  Pain with palpation over the right sciatic nerve. Pedal pulses equal, adequate circulation, good touch sensation. Straight leg raises without difficulty.   Neurological: She is alert and oriented to person, place, and time. She has normal strength. No cranial nerve deficit or sensory deficit. Gait normal.  Reflex Scores:      Bicep reflexes are 2+ on the right side  and 2+ on the left side.      Brachioradialis reflexes are 2+ on the right side and 2+ on the left side.      Patellar reflexes are 2+ on the right side and 2+ on the left side.      Achilles reflexes are 2+ on the right side and 2+ on the left side. Skin: Skin is warm and dry.  Psychiatric: She has a normal mood and affect. Her behavior is normal.  Nursing note and vitals reviewed.   ED Course  Procedures (including critical care time) Labs Review Results for orders placed or performed during the hospital encounter of 02/21/14 (from the past 24 hour(s))  Pregnancy, urine     Status: None   Collection Time: 02/21/14   3:44 PM  Result Value Ref Range   Preg Test, Ur NEGATIVE NEGATIVE  Urinalysis, Routine w reflex microscopic     Status: Abnormal   Collection Time: 02/21/14  3:44 PM  Result Value Ref Range   Color, Urine YELLOW YELLOW   APPearance CLEAR CLEAR   Specific Gravity, Urine 1.017 1.005 - 1.030   pH 7.0 5.0 - 8.0   Glucose, UA NEGATIVE NEGATIVE mg/dL   Hgb urine dipstick TRACE (A) NEGATIVE   Bilirubin Urine NEGATIVE NEGATIVE   Ketones, ur NEGATIVE NEGATIVE mg/dL   Protein, ur NEGATIVE NEGATIVE mg/dL   Urobilinogen, UA 1.0 0.0 - 1.0 mg/dL   Nitrite NEGATIVE NEGATIVE   Leukocytes, UA NEGATIVE NEGATIVE  Urine microscopic-add on     Status: None   Collection Time: 02/21/14  3:44 PM  Result Value Ref Range   Squamous Epithelial / LPF RARE RARE   RBC / HPF 0-2 <3 RBC/hpf   Bacteria, UA RARE RARE    MDM  38 y.o. female with right sciatic nerve pain. Stable for discharge without neuro deficits. Will treat for pain and inflammation. She will follow up with ortho. Discussed with the patient and all questioned fully answered.    Medication List    TAKE these medications        cyclobenzaprine 10 MG tablet  Commonly known as:  FLEXERIL  Take 1 tablet (10 mg total) by mouth 2 (two) times daily as needed for muscle spasms.     oxyCODONE-acetaminophen 5-325 MG per tablet  Commonly known as:  ROXICET  Take 1-2 tablets by mouth every 4 (four) hours as needed for severe pain.     predniSONE 10 MG tablet  Commonly known as:  DELTASONE  Take 2 tablets (20 mg total) by mouth 2 (two) times daily with a meal.      ASK your doctor about these medications        diphenhydrAMINE 25 MG tablet  Commonly known as:  BENADRYL  Take 1 tablet (25 mg total) by mouth every 6 (six) hours.     EPINEPHrine 0.3 mg/0.3 mL Soaj injection  Commonly known as:  EPIPEN  Inject 0.3 mLs (0.3 mg total) into the muscle as needed.     erythromycin 250 MG tablet  Commonly known as:  E-MYCIN  Take 1 tablet (250 mg  total) by mouth 4 (four) times daily.     erythromycin ophthalmic ointment  Place a 1/2 inch ribbon of ointment into the lower eyelid.     naproxen 500 MG tablet  Commonly known as:  NAPROSYN  Take 1 tablet (500 mg total) by mouth 2 (two) times daily.     ranitidine 150 MG tablet  Commonly known as:  ZANTAC  Take 1 tablet (150 mg total) by mouth 2 (two) times daily.           [address] [place], NP 02/21/14 Susan Moore, MD 02/22/14 [phone]

## 2014-02-21 NOTE — ED Notes (Signed)
Reports back pian x 3 days- denies injury

## 2014-04-10 ENCOUNTER — Encounter (HOSPITAL_BASED_OUTPATIENT_CLINIC_OR_DEPARTMENT_OTHER): Payer: Self-pay | Admitting: *Deleted

## 2014-04-10 ENCOUNTER — Emergency Department (HOSPITAL_BASED_OUTPATIENT_CLINIC_OR_DEPARTMENT_OTHER): Payer: Self-pay

## 2014-04-10 ENCOUNTER — Inpatient Hospital Stay (HOSPITAL_BASED_OUTPATIENT_CLINIC_OR_DEPARTMENT_OTHER)
Admission: EM | Admit: 2014-04-10 | Discharge: 2014-04-14 | DRG: 812 | Disposition: A | Discharge status: Home / Self Care | Payer: Self-pay | Attending: Internal Medicine | Admitting: Internal Medicine

## 2014-04-10 DIAGNOSIS — R109 Unspecified abdominal pain: Secondary | ICD-10-CM

## 2014-04-10 DIAGNOSIS — D62 Acute posthemorrhagic anemia: Secondary | ICD-10-CM

## 2014-04-10 DIAGNOSIS — F17210 Nicotine dependence, cigarettes, uncomplicated: Secondary | ICD-10-CM | POA: Diagnosis present

## 2014-04-10 DIAGNOSIS — R600 Localized edema: Secondary | ICD-10-CM | POA: Insufficient documentation

## 2014-04-10 DIAGNOSIS — I340 Nonrheumatic mitral (valve) insufficiency: Secondary | ICD-10-CM | POA: Diagnosis present

## 2014-04-10 DIAGNOSIS — D649 Anemia, unspecified: Principal | ICD-10-CM | POA: Diagnosis present

## 2014-04-10 DIAGNOSIS — Z886 Allergy status to analgesic agent status: Secondary | ICD-10-CM

## 2014-04-10 DIAGNOSIS — M5116 Intervertebral disc disorders with radiculopathy, lumbar region: Secondary | ICD-10-CM | POA: Diagnosis present

## 2014-04-10 DIAGNOSIS — T39395A Adverse effect of other nonsteroidal anti-inflammatory drugs [NSAID], initial encounter: Secondary | ICD-10-CM | POA: Diagnosis present

## 2014-04-10 DIAGNOSIS — M5442 Lumbago with sciatica, left side: Secondary | ICD-10-CM | POA: Insufficient documentation

## 2014-04-10 DIAGNOSIS — R32 Unspecified urinary incontinence: Secondary | ICD-10-CM | POA: Diagnosis present

## 2014-04-10 DIAGNOSIS — K5900 Constipation, unspecified: Secondary | ICD-10-CM | POA: Diagnosis present

## 2014-04-10 DIAGNOSIS — M5440 Lumbago with sciatica, unspecified side: Secondary | ICD-10-CM | POA: Diagnosis present

## 2014-04-10 DIAGNOSIS — J45909 Unspecified asthma, uncomplicated: Secondary | ICD-10-CM | POA: Diagnosis present

## 2014-04-10 DIAGNOSIS — K30 Functional dyspepsia: Secondary | ICD-10-CM | POA: Diagnosis present

## 2014-04-10 DIAGNOSIS — K2970 Gastritis, unspecified, without bleeding: Secondary | ICD-10-CM | POA: Diagnosis present

## 2014-04-10 DIAGNOSIS — E538 Deficiency of other specified B group vitamins: Secondary | ICD-10-CM | POA: Diagnosis present

## 2014-04-10 LAB — COMPREHENSIVE METABOLIC PANEL
ALBUMIN: 4.1 g/dL (ref 3.5–5.2)
ALT: 36 U/L — AB (ref 0–35)
AST: 62 U/L — ABNORMAL HIGH (ref 0–37)
Alkaline Phosphatase: 51 U/L (ref 39–117)
Anion gap: 5 (ref 5–15)
BUN: 13 mg/dL (ref 6–23)
CALCIUM: 8.8 mg/dL (ref 8.4–10.5)
CO2: 21 mmol/L (ref 19–32)
Chloride: 111 mEq/L (ref 96–112)
Creatinine, Ser: 0.69 mg/dL (ref 0.50–1.10)
GFR calc non Af Amer: >90 mL/min (ref >90)
Glucose, Bld: 117 mg/dL — ABNORMAL HIGH (ref 70–99)
Potassium: 3.8 mmol/L (ref 3.5–5.1)
SODIUM: 137 mmol/L (ref 135–145)
TOTAL PROTEIN: 6.7 g/dL (ref 6.0–8.3)
Total Bilirubin: 0.6 mg/dL (ref 0.3–1.2)

## 2014-04-10 LAB — PROTIME-INR
INR: 1.3 (ref 0.00–1.49)
PROTHROMBIN TIME: 16.3 s — AB (ref 11.6–15.2)

## 2014-04-10 LAB — BRAIN NATRIURETIC PEPTIDE: B NATRIURETIC PEPTIDE 5: 50.7 pg/mL (ref 0.0–100.0)

## 2014-04-10 LAB — PREPARE RBC (CROSSMATCH)

## 2014-04-10 MED ORDER — PANTOPRAZOLE SODIUM 40 MG PO TBEC: 80.0000 mg | DELAYED_RELEASE_TABLET | Freq: Every day | ORAL | Status: DC

## 2014-04-10 MED ORDER — SODIUM CHLORIDE 0.9 % IV SOLN: Freq: Once | INTRAVENOUS | Status: DC

## 2014-04-10 MED ORDER — FAMOTIDINE 20 MG PO TABS: 20.0000 mg | ORAL_TABLET | Freq: Every day | ORAL | Status: DC

## 2014-04-10 MED ORDER — FUROSEMIDE 10 MG/ML IJ SOLN
20.0000 mg | Freq: Once | INTRAMUSCULAR | Status: AC
  Administered 2014-04-10: 20 mg via INTRAVENOUS
  Filled 2014-04-10: qty 2

## 2014-04-10 MED ORDER — OXYCODONE-ACETAMINOPHEN 5-325 MG PO TABS
1.0000 | ORAL_TABLET | ORAL | Status: DC | PRN
  Administered 2014-04-11 – 2014-04-12 (×3): 2 via ORAL
  Administered 2014-04-12: 1 via ORAL
  Administered 2014-04-12: 2 via ORAL
  Administered 2014-04-13: 1 via ORAL
  Administered 2014-04-13 – 2014-04-14 (×3): 2 via ORAL
  Filled 2014-04-10 (×4): qty 2
  Filled 2014-04-10: qty 1
  Filled 2014-04-10 (×4): qty 2

## 2014-04-10 MED ORDER — MORPHINE SULFATE 2 MG/ML IJ SOLN
2.0000 mg | INTRAMUSCULAR | Status: DC | PRN
  Administered 2014-04-14: 2 mg via INTRAMUSCULAR
  Filled 2014-04-10: qty 1

## 2014-04-10 MED ORDER — ONDANSETRON HCL 4 MG/2ML IJ SOLN
4.0000 mg | Freq: Once | INTRAMUSCULAR | Status: AC
  Administered 2014-04-10: 4 mg via INTRAVENOUS
  Filled 2014-04-10: qty 2

## 2014-04-10 MED ORDER — MORPHINE SULFATE 4 MG/ML IJ SOLN
4.0000 mg | Freq: Once | INTRAMUSCULAR | Status: AC
  Administered 2014-04-10: 4 mg via INTRAVENOUS
  Filled 2014-04-10: qty 1

## 2014-04-10 MED ORDER — SODIUM CHLORIDE 0.9 % IJ SOLN
3.0000 mL | Freq: Two times a day (BID) | INTRAMUSCULAR | Status: DC
  Administered 2014-04-10 – 2014-04-14 (×5): 3 mL via INTRAVENOUS

## 2014-04-10 MED ORDER — INFLUENZA VAC SPLIT QUAD 0.5 ML IM SUSY
0.5000 mL | PREFILLED_SYRINGE | INTRAMUSCULAR | Status: DC
  Filled 2014-04-10: qty 0.5

## 2014-04-10 NOTE — Progress Notes (Signed)
[facility] Brief Accept Note.   Called by Dr. Stark Jock at [facility] re: Courtney Davies.   She is 38yo.  Leg swelling, SOB X about 2 days, DOE X 1 month.  Further symptoms include left thigh pain for about a week with moderate back pain.  Hgb 5.4.  She is hemodynamically stable currently with a BP of 123/63 and a pulse of 88.  She reports a history of some anemia and B12 deficiency, but she has not been treated for that.  Last H/H in April was 9/25.  She has been accepted for transfer to [facility] hospital for further work up.    Gilles Chiquito, MD

## 2014-04-10 NOTE — H&P (Addendum)
[facility] History and Physical  KENYONA RENA SWN:[id number] DOB: [birth year] DOA: 04/10/2014  Referring physician: ED physician PCP: No PCP Per Patient   Chief Complaint: LE swelling and back pain  HPI:  Courtney Davies is a 38yo woman with PMH of anemia from B12 deficiency who presents with low back and LE swelling. Ms. Chirico notes that she began having back pain around Nov 1 with left leg sciatica.  About 2 weeks ago, she also developed bilateral LE swelling with tenderness and pain with walking.  She notes that the sciatica is intermittent and worse with walking, coughing or sneezing.  The pain has been significant and she presented to urgent care due to the pain around the time it started and was given Rx for steroids, percocet and flexeril.  She has also been taking aleve 2 563m tablets once a day alternating with 803mtablets of advil, 2 tablets.  She sometimes takes doses of these up to 2 times per day over the last few weeks.  This gives her some relief to the pain.  She has been having incontinence where she feels that she needs to go to the bathroom and if she does not go relatively soon, she has some dribbling.  She has not had any bowel incontinence or saddle anesthesia.  She reports no known trauma.  She denies any history of recent long travel or surgery, she does not have a history of heart disease or heart failure.    In the ED, she was found to have a profound anemia with Hgb of 5.4.  She notes that she has had this before, even worse.  The only thing they found at the time was B12 deficiency.  She has supposed to have been on supplementation but has not taken for at least 4 years.  She reports occasional dark stools over the last few weeks.  She does have dyspepsia with regurgitation, chest pain with lying down, coughing at night and vomiting due to the coughing.  She has not noted any blood in the emesis.    [facility]  [facility].    Assessment and Plan:  Symptomatic normocytic anemia: Patient with history of anemia and now with intake of large doses of NSAIDs.  She possible has an acute on chronic anemia with a vitamin deficiency at baseline and now with blood loss from a gastritis.  - Hemocult stool - PPI started PO as she is hemodynamically stable - H/H low, will give 2 units of blood and re-evaluate - Hold all NSAIDs - Marked schistocytes on smear, check LDH, retic, anemia panel, haptoglobin.  She does have mild AST/ALT elevation, Tbili normal.  She was not jaundiced on exam.  Will further evaluate for hemolytic anemia if any of these are concerning.     Bilateral edema of lower extremity: I am concerned for high output failure given severe anemia - Check TTE - She was given 1 dose of lasix in the ED, but will hold off on further for now - Her exam is not consistent with a DVT as she has bilateral pain and tenderness to palpation and no palpable cords.  If TTE normal, would consider LE ultrasound bilateral   Low back pain with left-sided sciatica - Patient has had persistent back pain for ~ 6 weeks now with limited relief with high dose NSAIDs, steroids and percocet - MRI of L spine while inpatient.    Radiological Exams on Admission:  Dg Chest 2 View  04/10/2014   CLINICAL DATA:  38 year old female with shortness of breath and lower extremity swelling for 2 weeks. Initial encounter.  EXAM: CHEST  2 VIEW  COMPARISON:  [facility] Chest radiographs 03/13/2011.  FINDINGS: Stable lung volumes, within normal limits. Normal cardiac size and mediastinal contours. Visualized tracheal air column is within normal limits. No pneumothorax, pulmonary edema, pleural effusion or confluent pulmonary opacity. Stable increased interstitial markings. No acute osseous abnormality identified.  IMPRESSION: No acute cardiopulmonary abnormality.   Electronically Signed   By: Lars Pinks M.D.   On: 04/10/2014  17:14   Code Status: Full Family Communication: Pt at bedside Disposition Plan: Admit for further evaluation     Review of Systems:  Constitutional: Negative for fever, chills and malaise/fatigue. Negative for diaphoresis.  HENT: Negative for hearing loss, ear pain, nosebleeds, congestion, sore throat Eyes: Negative for blurred vision, double vision, photophobia Respiratory: Negative for hemoptysis, sputum production, shortness of breath, wheezing and stridor.   Cardiovascular: + for leg swelling and pain. Negative for chest pain, palpitations, orthopnea Gastrointestinal: + for nausea, vomiting, abdominal pain (Lower quadrants), heartburn, possible melena. Negative for constipation, blood in stool   Genitourinary: + for stress incontinence, no bowel incontinence Negative for dysuria, frequency Musculoskeletal: + for back pain and leg pain. Negative for myalgias, joint pain and falls.  Skin: Negative for itching and rash.  Neurological: Negative for dizziness and weakness. Negative for tingling, tremors, sensory change, speech change, focal weakness, loss of consciousness and headaches.  Endo/Heme/Allergies: Negative for environmental allergies and polydipsia Psychiatric/Behavioral: The patient is not nervous/anxious.      Past Medical History  Diagnosis Date  . Asthma     as a child    Past Surgical History  Procedure Laterality Date  . Cesarean section      X 2  . Hernia repair      Social History:  reports that she has been smoking Cigarettes.  She has been smoking about 0.50 packs per day. She has never used smokeless tobacco. She reports that she uses illicit drugs. She reports that she does not drink alcohol.  Allergies  Allergen Reactions  . Codeine     Family History  Problem Relation Age of Onset  . Anemia      Dad's side  . Valvular heart disease      Dad's side   She is no longer taking erythromycin, prednisone and cannot afford ranitidine Prior to  Admission medications   Medication Sig Start Date End Date Taking? Authorizing Provider  cyclobenzaprine (FLEXERIL) 10 MG tablet Take 1 tablet (10 mg total) by mouth 2 (two) times daily as needed for muscle spasms. 02/21/14   Hope Bunnie Pion, NP  diphenhydrAMINE (BENADRYL) 25 MG tablet Take 1 tablet (25 mg total) by mouth every 6 (six) hours. 10/04/12   Hope Bunnie Pion, NP  EPINEPHrine (EPIPEN) 0.3 mg/0.3 mL DEVI Inject 0.3 mLs (0.3 mg total) into the muscle as needed. 10/04/12   Hope Bunnie Pion, NP  erythromycin (E-MYCIN) 250 MG tablet Take 1 tablet (250 mg total) by mouth 4 (four) times daily. 12/19/12   Dorie Rank, MD  erythromycin ophthalmic ointment Place a 1/2 inch ribbon of ointment into the lower eyelid. 12/19/12   Dorie Rank, MD  naproxen (NAPROSYN) 500 MG tablet Take 1 tablet (500 mg total) by mouth 2 (two) times daily. 12/19/12   Dorie Rank, MD  oxyCODONE-acetaminophen (ROXICET) 5-325 MG per tablet Take 1-2 tablets by  mouth every 4 (four) hours as needed for severe pain. 02/21/14   Hope Bunnie Pion, NP  predniSONE (DELTASONE) 10 MG tablet Take 2 tablets (20 mg total) by mouth 2 (two) times daily with a meal. 02/21/14   Hope Bunnie Pion, NP  ranitidine (ZANTAC) 150 MG tablet Take 1 tablet (150 mg total) by mouth 2 (two) times daily. 10/04/12   Hope Bunnie Pion, NP    Physical Exam: Filed Vitals:   04/10/14 1652 04/10/14 1732 04/10/14 1938 04/10/14 2052  BP: 110/56 123/63 117/43 108/50  Pulse:  88 75 78  Temp:   98 F (36.7 C) 99.6 F (37.6 C)  TempSrc:   Oral Oral  Resp:  18 18 16   Height:    5' 7"  (1.702 m)  Weight:    206 lb 12.8 oz (93.804 kg)  SpO2:  100% 100% 100%    Physical Exam  Constitutional: Appears well-developed and well-nourished. No distress.  HENT: Normocephalic. atruamatic Eyes: Conjunctivae normal. PERRLA, no scleral icterus.  Neck: Normal ROM. Neck supple.  CVS: RR, Normal rhythm, S1/S2 +, no murmurs Pulmonary: Effort and breath sounds normal, no rhonchi, wheezes, rales.  Abdominal: Soft.  BS +,  no distension, tenderness, rebound or guarding.  Musculoskeletal: Normal range of motion. Non pitting edema to bilateral LE, pitting to ankles.  Very tender to palpation, particularly at ankles.  Back is mildly tender in the midline in the L spine Neuro: Alert. Normal muscle tone and coordination. No cranial nerve deficit. Skin: Skin is warm and dry. She has fake nails on Psychiatric: Normal mood and affect. Behavior, judgment, thought content normal.   Labs on Admission:  Basic Metabolic Panel:  Recent Labs Lab 04/10/14 1630  NA 137  K 3.8  CL 111  CO2 21  GLUCOSE 117*  BUN 13  CREATININE 0.69  CALCIUM 8.8   Liver Function Tests:  Recent Labs Lab 04/10/14 1630  AST 62*  ALT 36*  ALKPHOS 51  BILITOT 0.6  PROT 6.7  ALBUMIN 4.1   CBC:  Recent Labs Lab 04/10/14 1630  WBC 7.7  NEUTROABS 4.4  HGB 5.4*  HCT 16.9*  MCV 85.4  PLT 213    EKG: Normal sinus rhythm, no acute ST/T wave changes  Gilles Chiquito, MD  [facility] Pager [phone]  If 7PM-7AM, please contact night-coverage [email or web address] Password [email or web address] 04/10/2014, 10:45 PM      \

## 2014-04-10 NOTE — ED Notes (Signed)
Pt c/o bil leg swelling and left thigh pain x 1 week

## 2014-04-10 NOTE — ED Notes (Addendum)
Attempted report to [facility] 5W 21 primary receiving RN

## 2014-04-10 NOTE — ED Notes (Signed)
EDP notified of critical HGB lab level.

## 2014-04-10 NOTE — Progress Notes (Signed)
Paged Dr. Daryll Drown for order for pt to travel off tele to MRI. Dr. Daryll Drown called back and said she would prefer to have the MRI done tomorrow after the pt receives blood.

## 2014-04-10 NOTE — ED Notes (Signed)
Up to b/r to void, admits to pain, ambulatory with steady gait, asks about smoking, no smoking explained, nicotine patch mentioned, updated with plan to transfer, pt aware. Alert, NAD, calm, interactive, no dyspnea noted. Family at [facility].

## 2014-04-10 NOTE — ED Provider Notes (Addendum)
CSN: [id number]     Arrival date & time 04/10/14  1403 History   This chart was scribed for Veryl Speak, MD by Jeanell Sparrow, ED Scribe. This patient was seen in room MH10/MH10 and the patient's care was started at 4:07 PM.    Chief Complaint  Patient presents with  . Leg Swelling   Patient is a 38 y.o. female presenting with general illness. The history is provided by the patient. No language interpreter was used.  Illness Location:  Bilateral lower extremities Severity:  Moderate Onset quality:  Sudden Duration:  2 days Timing:  Constant Progression:  Unchanged Chronicity:  New Relieved by:  None Worsened by:  None Ineffective treatments:  None Associated symptoms: myalgias   Associated symptoms: no chest pain and no shortness of breath    HPI Comments: DELITA CHIQUITO is a 38 y.o. female who presents to the Emergency Department complaining of constant moderate bilateral lower extremity swelling that started yesterday. She reports that she noticed yesterday that her legs were swollen and could not fit into her clothes and shoes. She states that she has constant moderate left thigh pain that has been going on for a week. She reports that she has been having constant moderate back pain that has been going for more than a week. She reports that ambulation exacerbates the pain. She states that she has no prior hx of leg pain and swelling. She denies any chest pain or SOB.   Past Medical History  Diagnosis Date  . Asthma    Past Surgical History  Procedure Laterality Date  . Cesarean section    . Hernia repair     History reviewed. No pertinent family history. History  Substance Use Topics  . Smoking status: Former Smoker -- 0.00 packs/day    Quit date: 10/14/2013  . Smokeless tobacco: Never Used  . Alcohol Use: No   OB History    No data available     Review of Systems  Respiratory: Negative for shortness of breath.   Cardiovascular: Positive for leg swelling. Negative  for chest pain.  Musculoskeletal: Positive for myalgias and back pain.  All other systems reviewed and are negative.   Allergies  Codeine  Home Medications   Prior to Admission medications   Medication Sig Start Date End Date Taking? Authorizing Provider  cyclobenzaprine (FLEXERIL) 10 MG tablet Take 1 tablet (10 mg total) by mouth 2 (two) times daily as needed for muscle spasms. 02/21/14   Hope Bunnie Pion, NP  diphenhydrAMINE (BENADRYL) 25 MG tablet Take 1 tablet (25 mg total) by mouth every 6 (six) hours. 10/04/12   Hope Bunnie Pion, NP  EPINEPHrine (EPIPEN) 0.3 mg/0.3 mL DEVI Inject 0.3 mLs (0.3 mg total) into the muscle as needed. 10/04/12   Hope Bunnie Pion, NP  erythromycin (E-MYCIN) 250 MG tablet Take 1 tablet (250 mg total) by mouth 4 (four) times daily. 12/19/12   Dorie Rank, MD  erythromycin ophthalmic ointment Place a 1/2 inch ribbon of ointment into the lower eyelid. 12/19/12   Dorie Rank, MD  naproxen (NAPROSYN) 500 MG tablet Take 1 tablet (500 mg total) by mouth 2 (two) times daily. 12/19/12   Dorie Rank, MD  oxyCODONE-acetaminophen (ROXICET) 5-325 MG per tablet Take 1-2 tablets by mouth every 4 (four) hours as needed for severe pain. 02/21/14   Hope Bunnie Pion, NP  predniSONE (DELTASONE) 10 MG tablet Take 2 tablets (20 mg total) by mouth 2 (two) times daily with a meal. 02/21/14  Hope Bunnie Pion, NP  ranitidine (ZANTAC) 150 MG tablet Take 1 tablet (150 mg total) by mouth 2 (two) times daily. 10/04/12   Hope Bunnie Pion, NP   BP 100/49 mmHg  Pulse 83  Temp(Src) 98.5 F (36.9 C)  Resp 18  Ht 5' 7"  (1.702 m)  Wt 197 lb (89.359 kg)  BMI 30.85 kg/m2  SpO2 98%  LMP 12/03/2012 Physical Exam  Constitutional: She is oriented to person, place, and time. She appears well-developed and well-nourished. No distress.  HENT:  Head: Normocephalic and atraumatic.  Neck: Neck supple. No tracheal deviation present.  Cardiovascular: Normal rate.   Pulmonary/Chest: Effort normal. No respiratory distress.   Musculoskeletal: Normal range of motion. She exhibits edema.  +2 BLE edema.   There is TTP in the left buttock.   Neurological: She is alert and oriented to person, place, and time.  DTRs are +1 and equal bilaterally. Strength is symmetrical in the lower extremities.    Skin: Skin is warm and dry.  Psychiatric: She has a normal mood and affect. Her behavior is normal.  Nursing note and vitals reviewed.   ED Course  Procedures (including critical care time) DIAGNOSTIC STUDIES: Oxygen Saturation is 98% on RA, normal by my interpretation.    COORDINATION OF CARE: 4:11 PM- Pt advised of plan for treatment which includes medication, radiology, and labs and pt agrees.  Labs Review Labs Reviewed - No data to display  Imaging Review No results found.   EKG Interpretation   Date/Time:  Saturday April 10 2014 16:33:16 EST Ventricular Rate:  78 PR Interval:  136 QRS Duration: 82 QT Interval:  386 QTC Calculation: 440 R Axis:   85 Text Interpretation:  Normal sinus rhythm Septal infarct , age  undetermined Abnormal ECG Confirmed by DELOS  MD, Alys Dulak ([id number]) on  04/10/2014 6:00:55 PM      MDM   Final diagnoses:  None    Patient is a 38 year old female who presents with leg swelling, weakness, and shortness of breath with exertion for the past month. Workup reveals a hemoglobin of 5.4. She is not reporting any bloody stool or melena and I suspect this is chronic in nature. She is hemodynamically stable. She tells me she has a history of B12 deficiency, however is not taking any vitamins or medications in many months. She has no primary doctor.  She is requesting to go to [facility]. I've discussed the case with Dr. Laveda Norman who is recommending the patient come through the emergency department due to her extreme anemia. I've spoken with Dr. Louie Bun who agrees to accept the patient.  I personally performed the services described in this documentation, which was scribed in  my presence. The recorded information has been reviewed and is accurate.       Veryl Speak, MD 04/10/14 1802  Shortly after arrangements were made for transfer to [facility], patient for me that she would prefer to go to [facility]. I've spoken with Dr. Daryll Drown from [facility] who agrees to admit.  Veryl Speak, MD 04/10/14 [phone]

## 2014-04-11 ENCOUNTER — Inpatient Hospital Stay (HOSPITAL_COMMUNITY): Payer: Self-pay

## 2014-04-11 DIAGNOSIS — M5442 Lumbago with sciatica, left side: Secondary | ICD-10-CM

## 2014-04-11 DIAGNOSIS — D649 Anemia, unspecified: Principal | ICD-10-CM

## 2014-04-11 LAB — CBC WITH DIFFERENTIAL/PLATELET
Basophils Absolute: 0 10*3/uL (ref 0.0–0.1)
Basophils Relative: 0 % (ref 0–1)
Eosinophils Absolute: 0.2 10*3/uL (ref 0.0–0.7)
Eosinophils Relative: 3 % (ref 0–5)
HEMATOCRIT: 16.9 % — AB (ref 36.0–46.0)
HEMOGLOBIN: 5.4 g/dL — AB (ref 12.0–15.0)
Lymphocytes Relative: 36 % (ref 12–46)
Lymphs Abs: 2.8 10*3/uL (ref 0.7–4.0)
MCH: 27.3 pg (ref 26.0–34.0)
MCHC: 32 g/dL (ref 30.0–36.0)
MCV: 85.4 fL (ref 78.0–100.0)
Monocytes Absolute: 0.3 10*3/uL (ref 0.1–1.0)
Monocytes Relative: 4 % (ref 3–12)
NEUTROS PCT: 57 % (ref 43–77)
Neutro Abs: 4.4 10*3/uL (ref 1.7–7.7)
Platelets: 213 10*3/uL (ref 150–400)
RBC: 1.98 MIL/uL — AB (ref 3.87–5.11)
RDW: 35.4 % — ABNORMAL HIGH (ref 11.5–15.5)
WBC: 7.7 10*3/uL (ref 4.0–10.5)

## 2014-04-11 LAB — CBC
HCT: 22.8 % — ABNORMAL LOW (ref 36.0–46.0)
Hemoglobin: 7.3 g/dL — ABNORMAL LOW (ref 12.0–15.0)
MCH: 26.4 pg (ref 26.0–34.0)
MCHC: 32 g/dL (ref 30.0–36.0)
MCV: 82.3 fL (ref 78.0–100.0)
PLATELETS: 154 10*3/uL (ref 150–400)
RBC: 2.77 MIL/uL — AB (ref 3.87–5.11)
RDW: 30.9 % — ABNORMAL HIGH (ref 11.5–15.5)
WBC: 6 10*3/uL (ref 4.0–10.5)

## 2014-04-11 LAB — VITAMIN B12: VITAMIN B 12: 62 pg/mL — AB (ref 211–911)

## 2014-04-11 LAB — FOLATE: Folate: 4.5 ng/mL

## 2014-04-11 LAB — IRON AND TIBC
Iron: 74 ug/dL (ref 42–145)
SATURATION RATIOS: 33 % (ref 20–55)
TIBC: 223 ug/dL — ABNORMAL LOW (ref 250–470)
UIBC: 149 ug/dL (ref 125–400)

## 2014-04-11 LAB — BILIRUBIN, FRACTIONATED(TOT/DIR/INDIR)
BILIRUBIN INDIRECT: 0.4 mg/dL (ref 0.3–0.9)
Bilirubin, Direct: 0.2 mg/dL (ref 0.0–0.3)
Total Bilirubin: 0.6 mg/dL (ref 0.3–1.2)

## 2014-04-11 LAB — RETICULOCYTES
RBC.: 1.91 MIL/uL — ABNORMAL LOW (ref 3.87–5.11)
RETIC CT PCT: 0.7 % (ref 0.4–3.1)
Retic Count, Absolute: 13.4 10*3/uL — ABNORMAL LOW (ref 19.0–186.0)

## 2014-04-11 LAB — FERRITIN: Ferritin: 466 ng/mL — ABNORMAL HIGH (ref 10–291)

## 2014-04-11 LAB — LACTATE DEHYDROGENASE: LDH: 3218 U/L — ABNORMAL HIGH (ref 94–250)

## 2014-04-11 LAB — HIV ANTIBODY (ROUTINE TESTING W REFLEX): HIV 1&2 Ab, 4th Generation: NONREACTIVE

## 2014-04-11 MED ORDER — CYCLOBENZAPRINE HCL 10 MG PO TABS
5.0000 mg | ORAL_TABLET | Freq: Three times a day (TID) | ORAL | Status: DC | PRN
Start: 2014-04-11 — End: 2014-04-14
  Administered 2014-04-13 – 2014-04-14 (×3): 5 mg via ORAL
  Filled 2014-04-11 (×3): qty 1

## 2014-04-11 MED ORDER — LORAZEPAM 2 MG/ML IJ SOLN
0.5000 mg | Freq: Once | INTRAMUSCULAR | Status: AC
  Administered 2014-04-11: 0.5 mg via INTRAVENOUS

## 2014-04-11 MED ORDER — POLYETHYLENE GLYCOL 3350 17 G PO PACK
17.0000 g | PACK | Freq: Two times a day (BID) | ORAL | Status: DC
Start: 2014-04-11 — End: 2014-04-14
  Administered 2014-04-11 – 2014-04-14 (×5): 17 g via ORAL
  Filled 2014-04-11 (×7): qty 1

## 2014-04-11 MED ORDER — LORAZEPAM 2 MG/ML IJ SOLN
INTRAMUSCULAR | Status: AC
  Filled 2014-04-11: qty 1

## 2014-04-11 MED ORDER — ONDANSETRON HCL 4 MG/2ML IJ SOLN
4.0000 mg | Freq: Four times a day (QID) | INTRAMUSCULAR | Status: DC | PRN
  Administered 2014-04-11: 4 mg via INTRAVENOUS
  Filled 2014-04-11: qty 2

## 2014-04-11 MED ORDER — PANTOPRAZOLE SODIUM 40 MG IV SOLR
40.0000 mg | Freq: Two times a day (BID) | INTRAVENOUS | Status: DC
  Administered 2014-04-11 (×2): 40 mg via INTRAVENOUS
  Filled 2014-04-11 (×4): qty 40

## 2014-04-11 MED ORDER — GADOBENATE DIMEGLUMINE 529 MG/ML IV SOLN
20.0000 mL | Freq: Once | INTRAVENOUS | Status: AC | PRN
  Administered 2014-04-11: 20 mL via INTRAVENOUS

## 2014-04-11 NOTE — Progress Notes (Signed)
[facility] PROGRESS NOTE  HUNTER PINKARD IWP:[id number] DOB: December 15, 1975 DOA: 04/10/2014 PCP: No PCP Per Patient  Assessment/Plan: 1-Anemia; symptomatic.  Could be multifactorial, GI related to gastritis secondary to NSAID.  S/P 2 units PRBC. Repeat Hb this morning.  Schistocytes  on peripheral smear. LDH and haptoglobin not drawn yet, bilirubin was normal. No renal failure, no confusion, no fevers.  Anemia panel pending. Will discussed case with hematologist.  Check HIV.   Gastritis;  Change Protonix to IV. Change diet to bland.  Avoid NSAID.   Bilateral LE edema; pain left; check doppler.  ECHO ordered.   Low back pain with left side sciatica Pain for more than 5 weeks.  MRI lumbar spine ordered.   Code Status: Full Code.  Family Communication: care discussed with patient.  Disposition Plan: Remain inpatient.    Consultants:    Procedures:  ECHO;Pending.   Doppler Pending.   Antibiotics:  none  HPI/Subjective: Complaining of nausea after she ate, eggs and sausage.   Objective: Filed Vitals:   04/11/14 0915  BP: 120/33  Pulse: 63  Temp: 98.3 F (36.8 C)  Resp: 16    Intake/Output Summary (Last 24 hours) at 04/11/14 1002 Last data filed at 04/11/14 0915  Gross per 24 hour  Intake    746 ml  Output   1000 ml  Net   -254 ml   Filed Weights   04/10/14 1407 04/10/14 2052  Weight: 89.359 kg (197 lb) 93.804 kg (206 lb 12.8 oz)    Exam:   General:  Alert in no distress.   Cardiovascular: S 1, S 2 RRR  Respiratory: CTA  Abdomen: soft, mild epigastric tenderness.   Musculoskeletal: trace edema.  Neuro; unable to assess motor strength due to pain, patient was able to raise slightly left LE.   Data Reviewed: Basic Metabolic Panel:  Recent Labs Lab 04/10/14 1630  NA 137  K 3.8  CL 111  CO2 21  GLUCOSE 117*  BUN 13  CREATININE 0.69  CALCIUM 8.8   Liver Function Tests:  Recent Labs Lab 04/10/14 1630  AST 62*  ALT 36*   ALKPHOS 51  BILITOT 0.6  PROT 6.7  ALBUMIN 4.1   No results for input(s): LIPASE, AMYLASE in the last 168 hours. No results for input(s): AMMONIA in the last 168 hours. CBC:  Recent Labs Lab 04/10/14 1630  WBC 7.7  NEUTROABS 4.4  HGB 5.4*  HCT 16.9*  MCV 85.4  PLT 213   Cardiac Enzymes: No results for input(s): CKTOTAL, CKMB, CKMBINDEX, TROPONINI in the last 168 hours. BNP (last 3 results) No results for input(s): PROBNP in the last 8760 hours. CBG: No results for input(s): GLUCAP in the last 168 hours.  No results found for this or any previous visit (from the past 240 hour(s)).   Studies: Dg Chest 2 View  04/10/2014   CLINICAL DATA:  38 year old female with shortness of breath and lower extremity swelling for 2 weeks. Initial encounter.  EXAM: CHEST  2 VIEW  COMPARISON:  [facility] Chest radiographs 03/13/2011.  FINDINGS: Stable lung volumes, within normal limits. Normal cardiac size and mediastinal contours. Visualized tracheal air column is within normal limits. No pneumothorax, pulmonary edema, pleural effusion or confluent pulmonary opacity. Stable increased interstitial markings. No acute osseous abnormality identified.  IMPRESSION: No acute cardiopulmonary abnormality.   Electronically Signed   By: Lars Pinks M.D.   On: 04/10/2014 17:14    Scheduled Meds: . sodium chloride  Intravenous Once  . Influenza vac split quadrivalent PF  0.5 mL Intramuscular Tomorrow-1000  . pantoprazole  80 mg Oral Daily  . sodium chloride  3 mL Intravenous Q12H   Continuous Infusions:   Active Problems:   Symptomatic anemia   Bilateral edema of lower extremity   Low back pain with left-sided sciatica    Time spent: 35 minutes.     Niel Hummer A  [facility] Pager [phone]. If 7PM-7AM, please contact night-coverage at [email or web address], password [facility] 04/11/2014, 10:02 AM  LOS: 1 day

## 2014-04-11 NOTE — Progress Notes (Signed)
Reviewed Medical Records. Attempted to see patient (being wheeled for MRI and U/S legs) Went to lab to review the smear. It was done at [facility] and we do not have it here. They are sending a courier to bring it over to be reviewed by [place] Patient received 2 units of PRBC and another CBC is going to be done after she returns from her tests. Instructed the lab to call me after they reviewed the smears to determine if the schistocystes were real or spurious. No increase in Bilirubin and she has low retic count (makes hemolysis unlikely); Platelets are also normal as well as renal function and mental status. (not TTP) Add LDH to next lab draw Will see the patient tomorrow to follow up.

## 2014-04-11 NOTE — Progress Notes (Signed)
  Echocardiogram 2D Echocardiogram has been performed.  Courtney Davies 04/11/2014, 3:18 PM

## 2014-04-11 NOTE — Progress Notes (Signed)
Blood bank called back and said to proceed with transfusion. Pt rescanned and transfusion resumed, as noted in blood admin section.

## 2014-04-11 NOTE — Progress Notes (Signed)
Blood bank called and said to stop unit of PRBC's and that there was another test that needed to be run. They said to stop blood and leave it hanging.

## 2014-04-12 DIAGNOSIS — R600 Localized edema: Secondary | ICD-10-CM

## 2014-04-12 DIAGNOSIS — D538 Other specified nutritional anemias: Secondary | ICD-10-CM

## 2014-04-12 LAB — CBC
HCT: 22.4 % — ABNORMAL LOW (ref 36.0–46.0)
HEMOGLOBIN: 7.2 g/dL — AB (ref 12.0–15.0)
MCH: 26 pg (ref 26.0–34.0)
MCHC: 32.1 g/dL (ref 30.0–36.0)
MCV: 80.9 fL (ref 78.0–100.0)
Platelets: 160 10*3/uL (ref 150–400)
RBC: 2.77 MIL/uL — ABNORMAL LOW (ref 3.87–5.11)
RDW: 30.8 % — AB (ref 11.5–15.5)
WBC: 5.1 10*3/uL (ref 4.0–10.5)

## 2014-04-12 LAB — COMPREHENSIVE METABOLIC PANEL
ALBUMIN: 3.7 g/dL (ref 3.5–5.2)
ALK PHOS: 52 U/L (ref 39–117)
ALT: 41 U/L — ABNORMAL HIGH (ref 0–35)
AST: 77 U/L — ABNORMAL HIGH (ref 0–37)
Anion gap: 6 (ref 5–15)
BUN: 12 mg/dL (ref 6–23)
CALCIUM: 8.8 mg/dL (ref 8.4–10.5)
CO2: 22 mmol/L (ref 19–32)
Chloride: 109 mEq/L (ref 96–112)
Creatinine, Ser: 0.59 mg/dL (ref 0.50–1.10)
GFR calc non Af Amer: >90 mL/min (ref >90)
GLUCOSE: 103 mg/dL — AB (ref 70–99)
POTASSIUM: 3.8 mmol/L (ref 3.5–5.1)
SODIUM: 137 mmol/L (ref 135–145)
TOTAL PROTEIN: 6.2 g/dL (ref 6.0–8.3)
Total Bilirubin: 0.4 mg/dL (ref 0.3–1.2)

## 2014-04-12 LAB — PREPARE RBC (CROSSMATCH)

## 2014-04-12 LAB — HAPTOGLOBIN: Haptoglobin: <25 mg/dL — ABNORMAL LOW (ref 45–215)

## 2014-04-12 LAB — ABO/RH: ABO/RH(D): O POS

## 2014-04-12 MED ORDER — CYANOCOBALAMIN 1000 MCG/ML IJ SOLN
1000.0000 ug | Freq: Every day | INTRAMUSCULAR | Status: DC
  Administered 2014-04-13 – 2014-04-14 (×2): 1000 ug via INTRAMUSCULAR
  Filled 2014-04-12 (×2): qty 1

## 2014-04-12 MED ORDER — DIPHENHYDRAMINE HCL 25 MG PO CAPS
25.0000 mg | ORAL_CAPSULE | Freq: Four times a day (QID) | ORAL | Status: DC | PRN
  Administered 2014-04-12 – 2014-04-13 (×2): 25 mg via ORAL
  Filled 2014-04-12 (×2): qty 1

## 2014-04-12 MED ORDER — FOLIC ACID 1 MG PO TABS
3.0000 mg | ORAL_TABLET | Freq: Every day | ORAL | Status: DC
  Administered 2014-04-12 – 2014-04-14 (×3): 3 mg via ORAL
  Filled 2014-04-12 (×3): qty 3

## 2014-04-12 MED ORDER — SODIUM CHLORIDE 0.9 % IV SOLN
Freq: Once | INTRAVENOUS | Status: AC
  Administered 2014-04-12: 12:00:00 via INTRAVENOUS

## 2014-04-12 MED ORDER — FUROSEMIDE 10 MG/ML IJ SOLN
20.0000 mg | Freq: Once | INTRAMUSCULAR | Status: AC
  Administered 2014-04-12: 20 mg via INTRAVENOUS
  Filled 2014-04-12: qty 2

## 2014-04-12 MED ORDER — CYANOCOBALAMIN 1000 MCG/ML IJ SOLN
100.0000 ug | Freq: Every day | INTRAMUSCULAR | Status: DC
  Administered 2014-04-12: 100 ug via INTRAMUSCULAR
  Filled 2014-04-12: qty 0.1

## 2014-04-12 MED ORDER — PANTOPRAZOLE SODIUM 40 MG PO TBEC
40.0000 mg | DELAYED_RELEASE_TABLET | Freq: Two times a day (BID) | ORAL | Status: DC
  Administered 2014-04-12 – 2014-04-14 (×5): 40 mg via ORAL
  Filled 2014-04-12 (×4): qty 1

## 2014-04-12 NOTE — Evaluation (Signed)
Physical Therapy Evaluation Patient Details Name: Courtney Davies MRN: [id number] DOB: 05-Sep-1975 Today's Date: 04/12/2014   History of Present Illness  Ms. Kuehnel is a 38yo woman with PMH of anemia from B12 deficiency who presents with low back and LE swelling. Ms. Touchton notes that she began having back pain around Nov 1 with left leg sciatica. About 2 weeks ago, she also developed bilateral LE swelling with tenderness and pain with walking. She notes that the sciatica is intermittent and worse with walking, coughing or sneezing. The pain has been significant and she presented to urgent care due to the pain around the time it started and was given Rx for steroids, percocet and flexeril. She has been having urinary incontinence without bowel incontinence or saddle anesthesia. In the ED, she was found to have a profound anemia with Hgb of 5.4 and B12 deficiency. She was transferred to [facility] for blood transfusion. MRI revealed broad L4-5 disc bulge with nerve compression.  Clinical Impression  Pt admitted with above diagnosis. Pt currently with functional limitations due to the deficits listed below (see PT Problem List). Pt with significantly limited activity due to pain, education given on back precautions as well as activity avoidance and safe mvmt patterns, highly recommend outpt PT during conservative mgmt phase of this situation.  Pt will benefit from skilled PT to increase their independence and safety with mobility to allow discharge to the venue listed below.       Follow Up Recommendations Outpatient PT    Equipment Recommendations  None recommended by PT    Recommendations for Other Services       Precautions / Restrictions Precautions Precautions: Back Precaution Booklet Issued: Yes (comment) Precaution Comments: given back precautions due to lumbar disc bulge Restrictions Weight Bearing Restrictions: No      Mobility  Bed Mobility Overal bed mobility: Modified  Independent             General bed mobility comments: pt independent with bed mobility but painful, trained her to perform bed mobility keeping back precautions, needs more practice with this  Transfers Overall transfer level: Needs assistance Equipment used: None Transfers: Sit to/from Stand Sit to Stand: Min guard         General transfer comment: pt with decreased WB'ing through LLE due to pain, min-guard A given on left side for safety  Ambulation/Gait Ambulation/Gait assistance: Min guard Ambulation Distance (Feet): 100 Feet Assistive device: 1 person hand held assist;None Gait Pattern/deviations: Step-through pattern;Decreased stance time - left;Decreased step length - left;Decreased weight shift to left;Antalgic Gait velocity: decreased Gait velocity interpretation: Below normal speed for age/gender General Gait Details: pt occasionally grabbed therapist's hand on left for pain control, left knee remained flexed, decreased WB'ing left LE, tendency to flex at hip which therapist corrected as part of education against all flexion motions  Stairs            Wheelchair Mobility    Modified Rankin (Stroke Patients Only)       Balance Overall balance assessment: Needs assistance Sitting-balance support: No upper extremity supported Sitting balance-Leahy Scale: Normal     Standing balance support: No upper extremity supported Standing balance-Leahy Scale: Fair Standing balance comment: decreased standing balance due to pain                             Pertinent Vitals/Pain Pain Assessment: 0-10 Pain Score: 8  Pain Location: left leg to foot Pain  Descriptors / Indicators: Burning Pain Intervention(s): Monitored during session;Premedicated before session    Home Living Family/patient expects to be discharged to:: Private residence Living Arrangements: Children Available Help at Discharge: Family;Available PRN/intermittently Type of Home:  House Home Access: Level entry     Home Layout: One level Home Equipment: None Additional Comments: pt works at a cleaners, 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    Prior Function Level of Independence: Independent               Hand Dominance        Extremity/Trunk Assessment   Upper Extremity Assessment: Overall WFL for tasks assessed           Lower Extremity Assessment: LLE deficits/detail   LLE Deficits / Details: pain extends through buttocks, down posterior thigh, calf, lateral foot, +SLR, full motion at hip, knee and ankle, able to rise up on toes in standing but painful, able to lift left toes against gravity  Cervical / Trunk Assessment: Normal  Communication   Communication: No difficulties  Cognition Arousal/Alertness: Awake/alert Behavior During Therapy: WFL for tasks assessed/performed Overall Cognitive Status: Within Functional Limits for tasks assessed                      General Comments General comments (skin integrity, edema, etc.): much education given regarding avoidance of flexion, posture, avoiding lifting    Exercises Other Exercises Other Exercises: pelvic tilts in supine x10 Other Exercises: standing minimal range lumbar extension x10      Assessment/Plan    PT Assessment Patient needs continued PT services  PT Diagnosis Difficulty walking;Abnormality of gait;Acute pain   PT Problem List Decreased strength;Pain;Decreased knowledge of precautions;Decreased mobility;Decreased balance;Decreased activity tolerance  PT Treatment Interventions DME instruction;Gait training;Functional mobility training;Therapeutic activities;Therapeutic exercise;Balance training;Patient/family education   PT Goals (Current goals can be found in the Care Plan section) Acute Rehab PT Goals Patient Stated Goal: decreased pain PT Goal Formulation: With patient Time For Goal Achievement: 04/26/14 Potential to Achieve Goals: Good     Frequency Min 3X/week   Barriers to discharge        Co-evaluation               End of Session   Activity Tolerance: Patient limited by pain Patient left: in chair;with call bell/phone within reach;with family/visitor present Nurse Communication: Mobility status         Time: 9767-3419 PT Time Calculation (min) (ACUTE ONLY): 27 min   Charges:   PT Evaluation $Initial PT Evaluation Tier I: 1 Procedure PT Treatments $Gait Training: 8-22 mins $Self Care/Home Management: 8-22   PT G Codes:      Leighton Roach, PT  Acute Rehab Services  [place], [place] 04/12/2014, 12:51 PM

## 2014-04-12 NOTE — Progress Notes (Signed)
Bilateral lower extremity venous duplex completed:  No evidence of DVT, superficial thrombosis, or Baker's cyst.   

## 2014-04-12 NOTE — Progress Notes (Signed)
Attempted to see pt this am, however pt was off the floor getting duplex.   I did review the patient's MRI which demonstrates as the primary finding a left eccentric, fairly broad based disc bulge at L4-5 with likely impingement of the traversing left L5 root. With the patient's anemia currently being worked up, I would favor treating her radiculopathy and back pain conservatively with anti-inflammatories (if not contraindicated), muscle relaxer, and narcotic pain medication as needed. I would see her in the office in a few weeks and review her progress at that time and discuss any more invasive treatment options.  I will see the patient and complete a full consult this afternoon.

## 2014-04-12 NOTE — Progress Notes (Signed)
[facility] PROGRESS NOTE  Courtney Davies KPT:[id number] DOB: Feb 23, 1976 DOA: 04/10/2014 PCP: No PCP Per Patient  Assessment/Plan: 1-Anemia; symptomatic.  Could be multifactorial, GI related to gastritis secondary to NSAID.  S/P 2 units PRBC. Repeat Hb this at 7. Patient with dyspnea on exertion, will transfuse one unit of PRBC.  Schistocytes  on peripheral smear. LDH and haptoglobin not drawn yet, bilirubin was normal. No renal failure, no confusion, no fevers.  Anemia panel consistent with B 12 deficiency.   HIV negative.  LDH elevated. Hematologist following.   Mitral Valve regurgitation; Will arrange follow up with cardiology.   Gastritis; pain resolved.  Change Protonix to oral . Continue with bland diet.  Avoid NSAID, steroid.  Occult blood pending.   Bilateral LE edema; doppler negative.  ECHO  Normal Ef.   Low back pain with left side sciatica;  Pain for more than 5 weeks.  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 Mild L 3 L 4 disc herniation.  Flexeril, opioid.  Neurosurgery consulted. Conservative management for now.   Code Status: Full Code.  Family Communication: care discussed with patient.  Disposition Plan: Remain inpatient.    Consultants:    Procedures:  ECHO;mitral valve regurgitation.   Doppler negatives.    Antibiotics:  none  HPI/Subjective: Dyspnea on exertion. Still with left leg pain.   Objective: Filed Vitals:   04/12/14 1405  BP: 103/41  Pulse:   Temp: 98.1 F (36.7 C)  Resp: 18    Intake/Output Summary (Last 24 hours) at 04/12/14 1421 Last data filed at 04/12/14 1348  Gross per 24 hour  Intake   1290 ml  Output    200 ml  Net   1090 ml   Filed Weights   04/10/14 1407 04/10/14 2052  Weight: 89.359 kg (197 lb) 93.804 kg (206 lb 12.8 oz)    Exam:   General:  Alert in no distress.   Cardiovascular: S 1, S 2 RRR  Respiratory: CTA  Abdomen: soft, mild epigastric tenderness.   Musculoskeletal:  trace edema.  Neuro; unable to assess motor strength due to pain, patient was able to raise slightly left LE.   Data Reviewed: Basic Metabolic Panel:  Recent Labs Lab 04/10/14 1630 04/12/14 0749  NA 137 137  K 3.8 3.8  CL 111 109  CO2 21 22  GLUCOSE 117* 103*  BUN 13 12  CREATININE 0.69 0.59  CALCIUM 8.8 8.8   Liver Function Tests:  Recent Labs Lab 04/10/14 1630 04/11/14 1325 04/12/14 0749  AST 62*  --  77*  ALT 36*  --  41*  ALKPHOS 51  --  52  BILITOT 0.6 0.6 0.4  PROT 6.7  --  6.2  ALBUMIN 4.1  --  3.7   No results for input(s): LIPASE, AMYLASE in the last 168 hours. No results for input(s): AMMONIA in the last 168 hours. CBC:  Recent Labs Lab 04/10/14 1630 04/11/14 1325 04/12/14 0749  WBC 7.7 6.0 5.1  NEUTROABS 4.4  --   --   HGB 5.4* 7.3* 7.2*  HCT 16.9* 22.8* 22.4*  MCV 85.4 82.3 80.9  PLT 213 154 160   Cardiac Enzymes: No results for input(s): CKTOTAL, CKMB, CKMBINDEX, TROPONINI in the last 168 hours. BNP (last 3 results) No results for input(s): PROBNP in the last 8760 hours. CBG: No results for input(s): GLUCAP in the last 168 hours.  No results found for this or any previous visit (from the past 240  hour(s)).   Studies: Dg Chest 2 View  04/10/2014   CLINICAL DATA:  38 year old female with shortness of breath and lower extremity swelling for 2 weeks. Initial encounter.  EXAM: CHEST  2 VIEW  COMPARISON:  [facility] Chest radiographs 03/13/2011.  FINDINGS: Stable lung volumes, within normal limits. Normal cardiac size and mediastinal contours. Visualized tracheal air column is within normal limits. No pneumothorax, pulmonary edema, pleural effusion or confluent pulmonary opacity. Stable increased interstitial markings. No acute osseous abnormality identified.  IMPRESSION: No acute cardiopulmonary abnormality.   Electronically Signed   By: Lars Pinks M.D.   On: 04/10/2014 17:14   Mr Lumbar Spine W Wo Contrast  04/11/2014    CLINICAL DATA:  38 year old female with left side pain radiating to the flank. Symptomatic anemia. Lower extremity edema. Low back pain with left side sciatica. Initial encounter.  EXAM: MRI LUMBAR SPINE WITHOUT AND WITH CONTRAST  TECHNIQUE: Multiplanar and multiecho pulse sequences of the lumbar spine were obtained without and with intravenous contrast.  CONTRAST:  33m MULTIHANCE GADOBENATE DIMEGLUMINE 529 MG/ML IV SOLN  COMPARISON:  Lumbar radiographs 01/02/2009. Chest radiographs 03/13/2011, 12/08/2007.  FINDINGS: Demonstrated on the prior chest radiographs (better visualized in 2009) the eleventh ribs are full size, the twelfth ribs are hypoplastic. Therefore suspect there is transitional lumbosacral anatomy with a sacralized L5 level. Correlation with radiographs is recommended prior to any operative intervention.  Diffusely abnormally decreased T1 and T2 marrow signal. No marrow edema or evidence of acute osseous abnormality. Stable and normal vertebral height and alignment, trace retrolisthesis at L5-S1.  Visualized lower thoracic spinal cord is normal with conus medularis at T11-T12. No abnormal intradural enhancement.  Visualized abdominal viscera remarkable for parapelvic renal cysts on the left. The liver is dark on T2 weighted images, but has a more normal appearance on T1 weighted images. The spleen appears within normal limits. Large body habitus. Small volume of pelvic free fluid, favor physiologic in this age group. Negative visualized posterior paraspinal soft tissues.  T10-T11:  Negative.  T11-T12:  Negative.  T12-L1:  Negative.  L1-L2:  Negative.  L2-L3:  Negative.  L3-L4: Mild disc desiccation. Mild left paracentral disc protrusion best seen on series 6, image 24. Borderline to mild facet hypertrophy. Mild left lateral recess stenosis (descending left L4 nerve roots). No spinal or foraminal stenosis.  L4-L5: Mild disc desiccation. Caudal disc extrusion in the midline best seen on series 3, image  8 and series 6, image 32. Disc material most affecting the descending left L5 nerve roots in the left lateral recess. Mild facet hypertrophy. No spinal stenosis. No L4 foraminal stenosis.  L5-S1:  Sacralized.  Negative.  IMPRESSION: 1. 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 Correlation with radiographs is recommended prior to any operative intervention. 2. 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 No spinal stenosis. 3. Small L3-L4 left paracentral disc herniation most affecting the descending left L4 nerve roots in the left lateral recess. 4. Diffusely abnormal bone marrow signal compatible with stated "Symptomatic anemia" (red marrow reactivation). 5. Small volume pelvic free fluid likely is physiologic.   Electronically Signed   By: LLars PinksM.D.   On: 04/11/2014 14:06    Scheduled Meds: . sodium chloride   Intravenous Once  . sodium chloride   Intravenous Once  . cyanocobalamin  100 mcg Intramuscular Daily  . furosemide  20 mg  Intravenous Once  . Influenza vac split quadrivalent PF  0.5 mL Intramuscular Tomorrow-1000  . pantoprazole  40 mg Oral BID  . polyethylene glycol  17 g Oral BID  . sodium chloride  3 mL Intravenous Q12H   Continuous Infusions:   Active Problems:   Symptomatic anemia   Bilateral edema of lower extremity   Low back pain with left-sided sciatica    Time spent: 25 minutes.     Niel Hummer A  [facility] Pager [phone]. If 7PM-7AM, please contact night-coverage at [email or web address], password [email or web address] 04/12/2014, 2:21 PM  LOS: 2 days

## 2014-04-12 NOTE — Consult Note (Signed)
[place]  [phone]   HEMATOLOGY ONCOLOGY CONSULTATION   Courtney Davies  DOB: Aug 17, 1975  MR#: [id number]  CSN#: [id number]    Requesting Physician: [facility]    Primary MD:   Reason for consult: Anemia  History of present illness:       Courtney Davies 39 y.o. female With a history of B-12 deficiency anemia, admitted with one-month history of lower back pain, complicated with bilateral lower extremity swelling and tenderness, causing difficulty with ambulation, unable to be managed as an outpatient. MRI of the spine is normal except for disk herniation. She was taking significant amount of NSAID's, including Aleve up to twice daily since that time, alternating with Motrin. She denies recent chest pain on exertion, shortness of breath on minimal exertion, pre-syncopal episodes, or palpitations.She had not noticed any recent bleeding such as epistaxis, hematuria or hematochezia. She never had a colonoscopy. She has no periods as she had a hysterectomy for heavy bleeding. She reports cold intolerance for the last 4 years..She had no prior history or diagnosis of cancer. Patient has been evaluated for anemia by a hematologist about 4 years ago, Dr. Baird Cancer at [facility]. She had a bone marrow biopsy at the time which per her report was negative. She has not been able to follow up due to lack of insurance.  She denies any pica and eats a variety of diet. She never donated blood. She received blood transfusions 4 years ago for hysterectomy.The patient was not on oral iron supplements. Never received IV Iron. She denies a history of sickle cell disease or sickle cell trait. She denies risk factors for HIV or hepatitis. In fact, Her HIV test on 04/11/2014 was nonreactive.Patient has a history of heavy tobacco abuse. She denies any history of alcohol abuse or recreational drugs.   Her CBC on admission demonstrated to hemoglobin of 5.4, significantly  lower from a prior CBC taken in April of this year, at which time her hemoglobin was 9. MCV is normal as well. No Hemoccult was tested. Peripheral blood smear showed possible schistocytes, with a repeat smear negative for schistocytes. There was not increase in Bilirubin and and her retic count is low at 13.4, which makes makes hemolysis unlikely. Platelets are also normal as well as renal function and mental status. (not TTP). LDH is elevated at 3218 however. Iron was 74, TIBC 223,Saturation 33, Ferritin 628, Folic acid 4.5 and vitamin B-12 62. Urine was negative for protein, nitrites, and had a trace of blood in dipstick. To date she has received 2 units of blood with good response. Her hemoglobin as of 12/28 is 7.3 We were kindly requested to see the patient with recommendations.    Past medical history:      Past Medical History  Diagnosis Date  . Asthma     as a child    Past surgical history:      Past Surgical History  Procedure Laterality Date  . Cesarean section      X 2  . Hernia repair     Hysterectomy  Medications:   Prior to Admission:  Prescriptions prior to admission  Medication Sig Dispense Refill Last Dose  . cyclobenzaprine (FLEXERIL) 10 MG tablet Take 1 tablet (10 mg total) by mouth 2 (two) times daily as needed for muscle spasms. 20 tablet 0 month ago  . oxyCODONE-acetaminophen (ROXICET) 5-325 MG per tablet Take 1-2 tablets by mouth every 4 (four) hours as needed for  severe pain. 15 tablet 0 month ago    . sodium chloride   Intravenous Once  . Influenza vac split quadrivalent PF  0.5 mL Intramuscular Tomorrow-1000  . pantoprazole (PROTONIX) IV  40 mg Intravenous Q12H  . polyethylene glycol  17 g Oral BID  . sodium chloride  3 mL Intravenous Q12H    OFB:PZWCHENIDPOEUMP, morphine injection, ondansetron (ZOFRAN) IV, oxyCODONE-acetaminophen  Allergies:  Allergies  Allergen Reactions  . Codeine Nausea And Vomiting  . Other Other (See Comments)    Hair glue  causes throat to close and hives    Family history:      Family History  Problem Relation Age of Onset  . Anemia      Dad's side  . Valvular heart disease      Dad's side   Grandfather died with colon cancer                           Social history:  The patient is single. She has 2 children in good health. He smokes 1 pack a day of cigarettes for about 13 years. She denies any history of alcohol or recreational drug use. 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 10th grade education.   Review of systems:   Constitutional: Denies fevers, chills or abnormal night sweats Eyes: Denies blurriness of vision, double vision or watery eyes Ears, nose, mouth, throat, and face: Denies mucositis or sore throat Respiratory: Denies cough, dyspnea or wheezes Cardiovascular: Denies palpitation, chest discomfort. Positive lower extremity swelling Gastrointestinal:  Denies nausea, heartburn or change in bowel habits Skin: Denies abnormal skin rashes Lymphatics: Denies new lymphadenopathy or easy bruising Neurological:Denies numbness, tingling or new weaknesses Musculoskeletal: lower back pain as per HPI Behavioral/Psych: Mood is stable, no new changes  All other systems were reviewed with the patient and are negative.   Physical exam:   Filed Vitals:   04/12/14 0600  BP: 96/39  Pulse: 65  Temp: 99.3 F (37.4 C)  Resp: 15   Filed Weights   04/10/14 1407 04/10/14 2052  Weight: 197 lb (89.359 kg) 206 lb 12.8 oz (93.804 kg)    GENERAL:alert, no distress and comfortable SKIN: skin color, texture, turgor are normal, no rashes or significant lesions EYES: normal, conjunctiva are pink and non-injected, sclera clear OROPHARYNX: no exudate, no erythema and lips, buccal mucosa, and tongue normal  NECK: supple, thyroid normal size, non-tender, without nodularity LYMPH:  no palpable lymphadenopathy in the cervical, axillary or inguinal LUNGS: clear to auscultation and percussion with  normal breathing effort HEART: regular rate & rhythm and no murmurs and mild bilateral lower extremity edema ( With negative doing extremity Dopplers) ABDOMEN: soft, non-tender and normal bowel sounds Musculoskeletal:no cyanosis of digits and no clubbing. Low back Mildly tender to palpation PSYCH: alert & oriented x 3 with fluent speech NEURO: no focal motor/sensory deficits   Lab results:      Recent Labs Lab 04/10/14 1630 04/11/14 1325  WBC 7.7 6.0  HGB 5.4* 7.3*  HCT 16.9* 22.8*  PLT 213 154  MCV 85.4 82.3  MCH 27.3 26.4  MCHC 32.0 32.0  RDW 35.4* 30.9*  LYMPHSABS 2.8  --   MONOABS 0.3  --   EOSABS 0.2  --   BASOSABS 0.0  --     Chemistries   Recent Labs Lab 04/10/14 1630  NA 137  K 3.8  CL 111  CO2 21  GLUCOSE 117*  BUN 13  CREATININE 0.69  CALCIUM 8.8    Anemia panel:   Recent Labs  04/10/14 2302  VITAMINB12 62*  FOLATE 4.5  FERRITIN 466*  TIBC 223*  IRON 74  RETICCTPCT 0.7    Coagulation profile  Recent Labs Lab 04/10/14 2255  INR 1.30    Urine Studies No results for input(s): UHGB, CRYS in the last 72 hours.  Invalid input(s): UACOL, UAPR, USPG, UPH, UTP, UGL, UKET, UBIL, UNIT, UROB, ULEU, UEPI, UWBC, URBC, UBAC, CAST, UCOM, BILUA  Studies:      Dg Chest 2 View  04/10/2014   CLINICAL DATA:  38 year old female with shortness of breath and lower extremity swelling for 2 weeks. Initial encounter.  EXAM: CHEST  2 VIEW  COMPARISON:  [facility] Chest radiographs 03/13/2011.  FINDINGS: Stable lung volumes, within normal limits. Normal cardiac size and mediastinal contours. Visualized tracheal air column is within normal limits. No pneumothorax, pulmonary edema, pleural effusion or confluent pulmonary opacity. Stable increased interstitial markings. No acute osseous abnormality identified.  IMPRESSION: No acute cardiopulmonary abnormality.   Electronically Signed   By: Lars Pinks M.D.   On: 04/10/2014 17:14   Mr Lumbar Spine  W Wo Contrast  04/11/2014   CLINICAL DATA:  38 year old female with left side pain radiating to the flank. Symptomatic anemia. Lower extremity edema. Low back pain with left side sciatica. Initial encounter.  EXAM: MRI LUMBAR SPINE WITHOUT AND WITH CONTRAST  TECHNIQUE: Multiplanar and multiecho pulse sequences of the lumbar spine were obtained without and with intravenous contrast.  CONTRAST:  46m MULTIHANCE GADOBENATE DIMEGLUMINE 529 MG/ML IV SOLN  COMPARISON:  Lumbar radiographs 01/02/2009. Chest radiographs 03/13/2011, 12/08/2007.  FINDINGS: Demonstrated on the prior chest radiographs (better visualized in 2009) the eleventh ribs are full size, the twelfth ribs are hypoplastic. Therefore suspect there is transitional lumbosacral anatomy with a sacralized L5 level. Correlation with radiographs is recommended prior to any operative intervention.  Diffusely abnormally decreased T1 and T2 marrow signal. No marrow edema or evidence of acute osseous abnormality. Stable and normal vertebral height and alignment, trace retrolisthesis at L5-S1.  Visualized lower thoracic spinal cord is normal with conus medularis at T11-T12. No abnormal intradural enhancement.  Visualized abdominal viscera remarkable for parapelvic renal cysts on the left. The liver is dark on T2 weighted images, but has a more normal appearance on T1 weighted images. The spleen appears within normal limits. Large body habitus. Small volume of pelvic free fluid, favor physiologic in this age group. Negative visualized posterior paraspinal soft tissues.  T10-T11:  Negative.  T11-T12:  Negative.  T12-L1:  Negative.  L1-L2:  Negative.  L2-L3:  Negative.  L3-L4: Mild disc desiccation. Mild left paracentral disc protrusion best seen on series 6, image 24. Borderline to mild facet hypertrophy. Mild left lateral recess stenosis (descending left L4 nerve roots). No spinal or foraminal stenosis.  L4-L5: Mild disc desiccation. Caudal disc extrusion in the  midline best seen on series 3, image 8 and series 6, image 32. Disc material most affecting the descending left L5 nerve roots in the left lateral recess. Mild facet hypertrophy. No spinal stenosis. No L4 foraminal stenosis.  L5-S1:  Sacralized.  Negative.  IMPRESSION: 1. 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 Correlation with radiographs is recommended prior to any operative intervention. 2. 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 No spinal stenosis. 3. Small L3-L4 left  paracentral disc herniation most affecting the descending left L4 nerve roots in the left lateral recess. 4. Diffusely abnormal bone marrow signal compatible with stated "Symptomatic anemia" (red marrow reactivation). 5. Small volume pelvic free fluid likely is physiologic.   Electronically Signed   By: Lars Pinks M.D.   On: 04/11/2014 14:06    Assessmnent/Plan:38 y.o. female admitted on with    Anemia of chronic disease B12 deficiency Anemia panel shows increased ferritin levels suggestive of chronic disease She does carry a history of B-12 deficiency, with low values during this admission Smear was indicative of schistocytes while she presented to an outside facility. However, review of a new smear, is negative for schistocytes, her renal functions are normal, Bilirubin is normal, and fatigue count is low, thus, making hemolysis is less likely diagnosis. Her LDH is elevated In the setting of cardiac disease. 2-D echo on 12/27 indicates Moderate mitral valve regurgitation and moderately severely dilated Left atrium despite normal systolic function with ejection fraction between 55 and 60%, and normal wall motion. Consider B12 injection followed by oral B12 Supplementation at 1 g daily thereafter. She will need B-12 levels checked on a regular basis. Consider check Epo and TSH levels.  Avoid NSAIDs.  Transfuse 2  units of blood if patient is symptomatic to maintain Hb to above 7 In any case, it would feel prudent to order a Hemoccult to rule out any chronic blood loss She wishes to follow up with Dr. Baird Cancer at [facility] once discharged for geographical convenience. Other medical issues as per admitting team, will continue to follow.    Rondel Jumbo, PA-C 04/12/2014   Attending Note  I personally saw the patient, reviewed the chart and examined the patient. I agree with the assessment and plan as documented above.  - severe normocytic anemia: No evidence of hemolysis by smear and bilirubin. Hapto is pending and LDH is elevated (possibly secondary to profound B 12 def) - Recommendation: daily B 12 injections while she is in-patient and then weekly X 4 followed by Monthly indefinitely. - Case manager to help with discharge planning for getting B 12 injections. Her mother stated that she will bring her to our cancer center for the injections. If Dr.Sanders doesn't accept her as a patient, I can see her in our clinic at [place] 1 week post-discharge  Thank you very much for the consultation.

## 2014-04-13 ENCOUNTER — Inpatient Hospital Stay (HOSPITAL_COMMUNITY): Payer: Self-pay

## 2014-04-13 LAB — CBC
HCT: 22.6 % — ABNORMAL LOW (ref 36.0–46.0)
HEMATOCRIT: 22 % — AB (ref 36.0–46.0)
HEMATOCRIT: 24.8 % — AB (ref 36.0–46.0)
HEMOGLOBIN: 8.1 g/dL — AB (ref 12.0–15.0)
HEMOGLOBIN: 7.1 g/dL — AB (ref 12.0–15.0)
HEMOGLOBIN: 7.2 g/dL — AB (ref 12.0–15.0)
MCH: 25.7 pg — ABNORMAL LOW (ref 26.0–34.0)
MCH: 26.7 pg (ref 26.0–34.0)
MCH: 26.8 pg (ref 26.0–34.0)
MCHC: 31.9 g/dL (ref 30.0–36.0)
MCHC: 32.7 g/dL (ref 30.0–36.0)
MCHC: 32.3 g/dL (ref 30.0–36.0)
MCV: 82.7 fL (ref 78.0–100.0)
MCV: 82.1 fL (ref 78.0–100.0)
MCV: 80.7 fL (ref 78.0–100.0)
PLATELETS: 174 10*3/uL (ref 150–400)
Platelets: 136 10*3/uL — ABNORMAL LOW (ref 150–400)
Platelets: 140 10*3/uL — ABNORMAL LOW (ref 150–400)
RBC: 2.8 MIL/uL — ABNORMAL LOW (ref 3.87–5.11)
RBC: 3.02 MIL/uL — ABNORMAL LOW (ref 3.87–5.11)
RBC: 2.66 MIL/uL — AB (ref 3.87–5.11)
RDW: 27.9 % — ABNORMAL HIGH (ref 11.5–15.5)
RDW: 28.2 % — ABNORMAL HIGH (ref 11.5–15.5)
RDW: 28.3 % — AB (ref 11.5–15.5)
WBC: 5.1 10*3/uL (ref 4.0–10.5)
WBC: 4.6 10*3/uL (ref 4.0–10.5)
WBC: 5.1 10*3/uL (ref 4.0–10.5)

## 2014-04-13 LAB — TYPE AND SCREEN
ABO/RH(D): O POS
ANTIBODY SCREEN: NEGATIVE
DONOR AG TYPE: NEGATIVE
Donor AG Type: NEGATIVE
Donor AG Type: NEGATIVE
UNIT DIVISION: 0
UNIT DIVISION: 0
Unit division: 0

## 2014-04-13 LAB — OCCULT BLOOD X 1 CARD TO LAB, STOOL: Fecal Occult Bld: NEGATIVE

## 2014-04-13 LAB — PATHOLOGIST SMEAR REVIEW

## 2014-04-13 MED ORDER — SENNOSIDES-DOCUSATE SODIUM 8.6-50 MG PO TABS
1.0000 | ORAL_TABLET | Freq: Two times a day (BID) | ORAL | Status: DC
  Administered 2014-04-13 – 2014-04-14 (×2): 1 via ORAL
  Filled 2014-04-13 (×3): qty 1

## 2014-04-13 MED ORDER — CYANOCOBALAMIN 1000 MCG/ML IJ SOLN: 1000.0000 ug | INTRAMUSCULAR | Status: DC

## 2014-04-13 MED ORDER — FOLIC ACID 1 MG PO TABS: 3.0000 mg | ORAL_TABLET | Freq: Every day | ORAL | Status: DC

## 2014-04-13 MED ORDER — TRAMADOL HCL 50 MG PO TABS: 50.0000 mg | ORAL_TABLET | Freq: Three times a day (TID) | ORAL | Status: DC | PRN

## 2014-04-13 MED ORDER — PANTOPRAZOLE SODIUM 40 MG PO TBEC: 40.0000 mg | DELAYED_RELEASE_TABLET | Freq: Two times a day (BID) | ORAL | Status: DC

## 2014-04-13 MED ORDER — FLEET ENEMA 7-19 GM/118ML RE ENEM
1.0000 | ENEMA | Freq: Once | RECTAL | Status: AC
  Administered 2014-04-13: 1 via RECTAL
  Filled 2014-04-13: qty 1

## 2014-04-13 MED ORDER — POLYETHYLENE GLYCOL 3350 17 G PO PACK: 17.0000 g | PACK | Freq: Two times a day (BID) | ORAL | Status: DC

## 2014-04-13 MED ORDER — OXYCODONE-ACETAMINOPHEN 5-325 MG PO TABS: 1.0000 | ORAL_TABLET | ORAL | Status: DC | PRN

## 2014-04-13 MED ORDER — CYCLOBENZAPRINE HCL 5 MG PO TABS: 5.0000 mg | ORAL_TABLET | Freq: Three times a day (TID) | ORAL | Status: DC | PRN

## 2014-04-13 MED ORDER — TRAMADOL HCL 50 MG PO TABS
50.0000 mg | ORAL_TABLET | Freq: Three times a day (TID) | ORAL | Status: DC | PRN
  Administered 2014-04-13: 50 mg via ORAL
  Filled 2014-04-13: qty 1

## 2014-04-13 NOTE — Progress Notes (Signed)
Utilization review completed. Adlene Adduci, RN, BSN. 

## 2014-04-13 NOTE — Progress Notes (Signed)
Administered Fleets enema. Pt. Tolerated enema well. Placed bedside commode within reach. Primary nurse will follow up for BM.

## 2014-04-13 NOTE — Progress Notes (Signed)
[facility] PROGRESS NOTE  Courtney Davies WUJ:[id number] DOB: 1975/06/30 DOA: 04/10/2014 PCP: No PCP Per Patient  Assessment/Plan: 1-Anemia; symptomatic.  Could be multifactorial, GI related to gastritis secondary to NSAID.  S/P 3 units PRBC. Hb at 8.  Anemia panel consistent with B 12 deficiency.   HIV negative.  LDH elevated. Hematologist following.  On B12 injection.   Mitral Valve regurgitation; I have arranged follow up with cardiology.   Gastritis; pain resolved.  Change Protonix to oral . Continue with bland diet.  Avoid NSAID, steroid.  Occult blood pending.   Bilateral LE edema; doppler negative.  ECHO  Normal Ef.   Low back pain with left side sciatica;  Pain for more than 5 weeks.  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 Mild L 3 L 4 disc herniation.  Flexeril, opioid.  Neurosurgery consulted. Conservative management for now.   Constipation; KUB , enema.   Code Status: Full Code.  Family Communication: care discussed with patient.  Disposition Plan: Remain inpatient.    Consultants:    Procedures:  ECHO;mitral valve regurgitation.   Doppler negatives.    Antibiotics:  none  HPI/Subjective: Still with back pain, leg pain.  No BM No dyspnea on exertion. Feeling better.   Objective: Filed Vitals:   04/13/14 1301  BP: 109/41  Pulse: 64  Temp: 98.1 F (36.7 C)  Resp: 16    Intake/Output Summary (Last 24 hours) at 04/13/14 1643 Last data filed at 04/13/14 1526  Gross per 24 hour  Intake    640 ml  Output   1200 ml  Net   -560 ml   Filed Weights   04/10/14 1407 04/10/14 2052  Weight: 89.359 kg (197 lb) 93.804 kg (206 lb 12.8 oz)    Exam:   General:  Alert in no distress.   Cardiovascular: S 1, S 2 RRR  Respiratory: CTA  Abdomen: soft, mild epigastric tenderness.   Musculoskeletal: trace edema.  Neuro; unable to assess motor strength due to pain, patient was able to raise slightly left LE.   Data  Reviewed: Basic Metabolic Panel:  Recent Labs Lab 04/10/14 1630 04/12/14 0749  NA 137 137  K 3.8 3.8  CL 111 109  CO2 21 22  GLUCOSE 117* 103*  BUN 13 12  CREATININE 0.69 0.59  CALCIUM 8.8 8.8   Liver Function Tests:  Recent Labs Lab 04/10/14 1630 04/11/14 1325 04/12/14 0749  AST 62*  --  77*  ALT 36*  --  41*  ALKPHOS 51  --  52  BILITOT 0.6 0.6 0.4  PROT 6.7  --  6.2  ALBUMIN 4.1  --  3.7   No results for input(s): LIPASE, AMYLASE in the last 168 hours. No results for input(s): AMMONIA in the last 168 hours. CBC:  Recent Labs Lab 04/10/14 1630 04/11/14 1325 04/12/14 0749 04/12/14 2353 04/13/14 0406 04/13/14 1029  WBC 7.7 6.0 5.1 5.1 5.1 4.6  NEUTROABS 4.4  --   --   --   --   --   HGB 5.4* 7.3* 7.2* 7.2* 7.1* 8.1*  HCT 16.9* 22.8* 22.4* 22.6* 22.0* 24.8*  MCV 85.4 82.3 80.9 80.7 82.7 82.1  PLT 213 154 160 174 136* 140*   Cardiac Enzymes: No results for input(s): CKTOTAL, CKMB, CKMBINDEX, TROPONINI in the last 168 hours. BNP (last 3 results) No results for input(s): PROBNP in the last 8760 hours. CBG: No results for input(s): GLUCAP in the last 168 hours.  No results found for this or any previous visit (from the past 240 hour(s)).   Studies: No results found.  Scheduled Meds: . sodium chloride   Intravenous Once  . cyanocobalamin  1,000 mcg Intramuscular Daily  . folic acid  3 mg Oral Daily  . Influenza vac split quadrivalent PF  0.5 mL Intramuscular Tomorrow-1000  . pantoprazole  40 mg Oral BID  . polyethylene glycol  17 g Oral BID  . sodium chloride  3 mL Intravenous Q12H   Continuous Infusions:   Active Problems:   Symptomatic anemia   Bilateral edema of lower extremity   Low back pain with left-sided sciatica    Time spent: 25 minutes.     Niel Hummer A  [facility] Pager [phone]. If 7PM-7AM, please contact night-coverage at [email or web address], password [facility] 04/13/2014, 4:43 PM  LOS: 3 days

## 2014-04-13 NOTE — Progress Notes (Signed)
CARE MANAGEMENT NOTE 04/13/2014  Patient:  ALEC, MCPHEE   Account Number:  [id number]  Date Initiated:  04/13/2014  Documentation initiated by:  [facility]  Subjective/Objective Assessment:   anemia     Action/Plan:   Anticipated DC Date:     Anticipated DC Plan:  [place]  CM consult  [facility]      Choice offered to / List presented to:             Status of service:  In process, will continue to follow Medicare Important Message given?   (If response is "NO", the following Medicare IM given date fields will be blank) Date Medicare IM given:   Medicare IM given by:   Date Additional Medicare IM given:   Additional Medicare IM given by:    Discharge Disposition:    Per UR Regulation:    If discussed at Long Length of Stay Meetings, dates discussed:    Comments:  04/13/2014 1455 NCM spoke to pt and states she does work at this time. She does not have a PCP. [facility] and appt arranged for 04/22/2013 at 11:30 am. Pt will need to bring ID and pay stubs from the past month. Will review medications at time of dc to assess if any assistance needed. Jonnie Finner RN CCM Case Mgmt phone [phone]

## 2014-04-13 NOTE — Progress Notes (Signed)
Physical Therapy Treatment Patient Details Name: Courtney Davies MRN: [id number] DOB: May 15, 1975 Today's Date: 04/13/2014    History of Present Illness Courtney Davies is a 38yo woman with PMH of anemia from B12 deficiency who presents with low back and LE swelling. Courtney Davies notes that she began having back pain around Nov 1 with left leg sciatica. About 2 weeks ago, she also developed bilateral LE swelling with tenderness and pain with walking. She notes that the sciatica is intermittent and worse with walking, coughing or sneezing. The pain has been significant and she presented to urgent care due to the pain around the time it started and was given Rx for steroids, percocet and flexeril. She has been having urinary incontinence without bowel incontinence or saddle anesthesia. In the ED, she was found to have a profound anemia with Hgb of 5.4 and B12 deficiency. She was transferred to [facility] for blood transfusion. MRI revealed broad L4-5 disc bulge with nerve compression.    PT Comments    Progressing as well as can be expected.  She continues to mobilize, but has lost some core and L Leg strength.  Leg leg pain is significant.  Follow Up Recommendations  Outpatient PT     Equipment Recommendations  None recommended by PT    Recommendations for Other Services       Precautions / Restrictions Precautions Precautions: Back Precaution Comments: given back precautions due to lumbar disc bulge    Mobility  Bed Mobility Overal bed mobility: Modified Independent             General bed mobility comments: reinforced pt in best technique side to sit.  pt was laterally flexing up then putting her legs off.  Transfers Overall transfer level: Needs assistance Equipment used: None Transfers: Sit to/from Stand Sit to Stand: Modified independent (Device/Increase time)            Ambulation/Gait Ambulation/Gait assistance: Supervision Ambulation Distance (Feet): 400 Feet Assistive  device: None Gait Pattern/deviations: Step-through pattern;Antalgic Gait velocity: decreased   General Gait Details: Gait antalgic in nature on the left.   Stairs Stairs: Yes Stairs assistance: Supervision Stair Management: One rail Right;Step to pattern;Forwards Number of Stairs: 10 General stair comments: safe with rail, but mildly effortful  Wheelchair Mobility    Modified Rankin (Stroke Patients Only)       Balance Overall balance assessment: Modified Independent   Sitting balance-Leahy Scale: Normal       Standing balance-Leahy Scale: Good                      Cognition Arousal/Alertness: Awake/alert Behavior During Therapy: WFL for tasks assessed/performed Overall Cognitive Status: Within Functional Limits for tasks assessed                      Exercises      General Comments General comments (skin integrity, edema, etc.): Reinforced back care and precautions, lifting restrictions, bed mobility, progression of activity.      Pertinent Vitals/Pain Pain Assessment: 0-10 Pain Score: 8  Pain Location: left leg down to the foot./ Pain Descriptors / Indicators: Burning Pain Intervention(s): Monitored during session;Limited activity within patient's tolerance    Home Living                      Prior Function            PT Goals (current goals can now be found in the care plan  section) Acute Rehab PT Goals Patient Stated Goal: decreased pain PT Goal Formulation: With patient Time For Goal Achievement: 04/26/14 Potential to Achieve Goals: Good Progress towards PT goals: Progressing toward goals    Frequency  Min 3X/week    PT Plan Current plan remains appropriate    Co-evaluation             End of Session   Activity Tolerance: Patient tolerated treatment well Patient left: in bed;with call bell/phone within reach;with family/visitor present     Time: 6394-3200 PT Time Calculation (min) (ACUTE ONLY): 18  min  Charges:  $Gait Training: 8-22 mins                    G Codes:      Courtney Davies, Courtney Davies 04/13/2014, 4:51 PM 04/13/2014  Courtney Davies, PT [phone]  (pager)

## 2014-04-13 NOTE — Consult Note (Signed)
CC:  Chief Complaint  Patient presents with  . Leg Swelling    HPI: Courtney Davies is a 38 y.o. female admitted to [address] with a primary complaint of leg swelling. She is currently being worked up for symptomatically anemia, possibly multifactorial in origin. In addition to her primary complaint, she has been complaining for approximate 3 months of back and left-sided leg pain. She says the pain started 3 months ago without any identifiable inciting event. She was initially seen in the emergency department on was given pain medications, and muscle relaxer, and "a steroid." This significantly helped with her back pain, however subsequent to this she developed fairly severe left-sided leg pain which is slowly but progressively gotten worse over the last month or 2. She describes a sharp pain which radiates down the back of her left buttock, thigh, calf, and into her ankle. She does not have numbness or tingling in the foot, although she does state both her feet are swollen. She does not have any right leg symptoms, nor does she have any bladder dysfunction. The pain medications and muscle relaxers she was given for her back have not significantly helped with her left leg pain. She has not been through any physical therapy or chiropractors.  PMH: Past Medical History  Diagnosis Date  . Asthma     as a child    PSH: Past Surgical History  Procedure Laterality Date  . Cesarean section      X 2  . Hernia repair      SH: History  Substance Use Topics  . Smoking status: Current Every Day Smoker -- 0.50 packs/day    Types: Cigarettes  . Smokeless tobacco: Never Used  . Alcohol Use: No    MEDS: Prior to Admission medications   Medication Sig Start Date End Date Taking? Authorizing Provider  cyclobenzaprine (FLEXERIL) 10 MG tablet Take 1 tablet (10 mg total) by mouth 2 (two) times daily as needed for muscle spasms. 02/21/14   [place], NP  oxyCODONE-acetaminophen (ROXICET) 5-325 MG per  tablet Take 1-2 tablets by mouth every 4 (four) hours as needed for severe pain. 02/21/14   [place], NP    ALLERGY: Allergies  Allergen Reactions  . Codeine Nausea And Vomiting  . Other Other (See Comments)    Hair glue causes throat to close and hives    ROS: ROS  NEUROLOGIC EXAM: Awake, alert, oriented Memory and concentration grossly intact Speech fluent, appropriate CN grossly intact Motor exam: Upper Extremities Deltoid Bicep Tricep Grip  Right 5/5 5/5 5/5 5/5  Left 5/5 5/5 5/5 5/5   Lower Extremity IP Quad PF DF EHL  Right 5/5 5/5 5/5 5/5 5/5    Left leg diffuse pain-limited giveaway weakness  Sensation grossly intact to LT  IMGAING: MRI demonstrates disc herniation on the left at L4-5 causing likely impingement of the traversing left L5 nerve root.  IMPRESSION: - 38 y.o. female with symptomatic anemia currently being worked up, and concomitant left L5 radiculopathy with left L4-5 disc herniation  PLAN: - W/U and mgmt of anemia per primary service - Continue conservative therapy for radiculopathy while anemia is treated.  - Can see pt in the office in the next few weeks once to discuss further treatment options which may include steroid injection or surgical decompression.  I reviewed the MRI findings with the patient and her daughter. I explained to them that at this point, I would prefer to treat her radiculopathy conservatively  while her   anemia is treated. Once this is done, I will see them in the outpatient clinic and discuss any further, more invasive treatment options. The patient and her daughter appear to understand our discussion and are willing to proceed with this plan. All their questions were answered.

## 2014-04-14 MED ORDER — MORPHINE SULFATE ER 15 MG PO TBCR
15.0000 mg | EXTENDED_RELEASE_TABLET | Freq: Two times a day (BID) | ORAL | Status: DC
  Administered 2014-04-14: 15 mg via ORAL
  Filled 2014-04-14: qty 1

## 2014-04-14 MED ORDER — MORPHINE SULFATE ER 15 MG PO TBCR: 15.0000 mg | EXTENDED_RELEASE_TABLET | Freq: Two times a day (BID) | ORAL | Status: DC

## 2014-04-14 MED ORDER — SENNOSIDES-DOCUSATE SODIUM 8.6-50 MG PO TABS: 1.0000 | ORAL_TABLET | Freq: Two times a day (BID) | ORAL | Status: DC

## 2014-04-14 MED ORDER — OXYCODONE-ACETAMINOPHEN 5-325 MG PO TABS: 1.0000 | ORAL_TABLET | ORAL | Status: DC | PRN

## 2014-04-14 NOTE — Progress Notes (Signed)
CARE MANAGEMENT NOTE 04/14/2014  Patient:  Courtney Davies, Courtney Davies   Account Number:  [id number]  Date Initiated:  04/13/2014  Documentation initiated by:  [facility]  Subjective/Objective Assessment:   anemia     Action/Plan:   Anticipated DC Date:  04/14/2014   Anticipated DC Plan:  [address]  CM consult  [facility]  [facility]      Choice offered to / List presented to:             Status of service:  Completed, signed off Medicare Important Message given?   (If response is "NO", the following Medicare IM given date fields will be blank) Date Medicare IM given:   Medicare IM given by:   Date Additional Medicare IM given:   Additional Medicare IM given by:    Discharge Disposition:  HOME/SELF CARE  Per UR Regulation:    If discussed at Long Length of Stay Meetings, dates discussed:    Comments:  04/14/2014 1345 Provided pt with [place] letter. Explained [place] does not cover narcotics. Jonnie Finner RN CCM Case Mgmt phone [phone]  04/13/2014 1455 NCM spoke to pt and states she does work at this time. She does not have a PCP. [facility] and appt arranged for 04/22/2013 at 11:30 am. Pt will need to bring ID and pay stubs from the past month. Will review medications at time of dc to assess if any assistance needed. Jonnie Finner RN CCM Case Mgmt phone [phone]

## 2014-04-14 NOTE — Discharge Summary (Signed)
Physician Discharge Summary  Courtney Davies JSR:[id number] DOB: April 02, 1976 DOA: 04/10/2014  PCP: No PCP Per Patient  Admit date: 04/10/2014 Discharge date: 04/14/2014  Time spent: 35 minutes  Recommendations for Outpatient Follow-up:  1. Need cbc to follow hb.  2. Need follow up with cardiology for mitral valve regurgitation.  3. Follow up with neurosurgeon for disk herniation.   Discharge Diagnoses:   Anemia panel consistent with B 12 deficiency.    Symptomatic anemia   Low back pain with left-sided sciatica   Discharge Condition: Stable.   Diet recommendation: Regular diet.   Filed Weights   04/10/14 1407 04/10/14 2052  Weight: 89.359 kg (197 lb) 93.804 kg (206 lb 12.8 oz)    History of present illness:  Courtney Davies is a 38yo woman with PMH of anemia from B12 deficiency who presents with low back and LE swelling. Courtney Davies notes that she began having back pain around Nov 1 with left leg sciatica. About 2 weeks ago, she also developed bilateral LE swelling with tenderness and pain with walking. She notes that the sciatica is intermittent and worse with walking, coughing or sneezing. The pain has been significant and she presented to urgent care due to the pain around the time it started and was given Rx for steroids, percocet and flexeril. She has also been taking aleve 2 554m tablets once a day alternating with 8025mtablets of advil, 2 tablets. She sometimes takes doses of these up to 2 times per day over the last few weeks. This gives her some relief to the pain. She has been having incontinence where she feels that she needs to go to the bathroom and if she does not go relatively soon, she has some dribbling. She has not had any bowel incontinence or saddle anesthesia. She reports no known trauma. She denies any history of recent long travel or surgery, she does not have a history of heart disease or heart failure.   In the ED, she was found to have a profound anemia  with Hgb of 5.4. She notes that she has had this before, even worse. The only thing they found at the time was B12 deficiency. She has supposed to have been on supplementation but has not taken for at least 4 years. She reports occasional dark stools over the last few weeks. She does have dyspepsia with regurgitation, chest pain with lying down, coughing at night and vomiting due to the coughing. She has not noted any blood in the emesis.   [facility]  Hospital Course:  1-Anemia; symptomatic.  Could be multifactorial, B-12 deficiency,  GI related to gastritis secondary to NSAID.  S/P 3 units PRBC. Hb at 8.    Anemia panel consistent with B 12 deficiency.  HIV negative.  LDH elevated. Hematologist following.  On B12 injection.   Mitral Valve regurgitation; I have arranged follow up with cardiology.   Gastritis; pain resolved.  Continue Protonix to oral . Continue with bland diet.  Avoid NSAID, steroid.  Occult blood negative,   Bilateral LE edema; doppler negative.  ECHO Normal Ef.   Low back pain with left side sciatica;  Pain for more than 5 weeks.  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 Mild L 3 L 4 disc herniation.  Flexeril, opioid. Will add MS contin.  Neurosurgery consulted. Conservative management for now.  Patient decline prednisone. She said that didn't help.  Constipation; KUB negative , resolved. Had Bowel movement.   Procedures:  ECHO;mitral valve regurgitation.   Doppler negatives.  Consultations:  Hematology  Neurosurgeon.   Discharge Exam: Filed Vitals:   04/14/14 0650  BP: 103/54  Pulse:   Temp:   Resp:     General: Alert in no distress.  Cardiovascular: S 1, S 2 RRR Respiratory: CTA  Discharge Instructions   Discharge Instructions    Diet - low sodium heart healthy    Complete by:  As directed      Diet - low sodium heart healthy     Complete by:  As directed      Increase activity slowly    Complete by:  As directed      Increase activity slowly    Complete by:  As directed           Current Discharge Medication List    START taking these medications   Details  cyanocobalamin (,VITAMIN B-12,) 1000 MCG/ML injection Inject 1 mL (1,000 mcg total) into the muscle once a week. Inject once a week for 4 weeks, then monthly. Qty: 10 mL, Refills: 1   Associated Diagnoses: Symptomatic anemia    folic acid (FOLVITE) 1 MG tablet Take 3 tablets (3 mg total) by mouth daily. Qty: 30 tablet, Refills: 0   Associated Diagnoses: Symptomatic anemia    morphine (MS CONTIN) 15 MG 12 hr tablet Take 1 tablet (15 mg total) by mouth every 12 (twelve) hours. Qty: 30 tablet, Refills: 0    pantoprazole (PROTONIX) 40 MG tablet Take 1 tablet (40 mg total) by mouth 2 (two) times daily. Qty: 60 tablet, Refills: 0    polyethylene glycol (MIRALAX / GLYCOLAX) packet Take 17 g by mouth 2 (two) times daily. Qty: 14 each, Refills: 0    senna-docusate (SENOKOT-S) 8.6-50 MG per tablet Take 1 tablet by mouth 2 (two) times daily. Qty: 30 tablet, Refills: 0    traMADol (ULTRAM) 50 MG tablet Take 1 tablet (50 mg total) by mouth every 8 (eight) hours as needed for moderate pain. Qty: 30 tablet, Refills: 0      CONTINUE these medications which have CHANGED   Details  cyclobenzaprine (FLEXERIL) 5 MG tablet Take 1 tablet (5 mg total) by mouth 3 (three) times daily as needed for muscle spasms (leg pain, back pain). Qty: 30 tablet, Refills: 0    oxyCODONE-acetaminophen (ROXICET) 5-325 MG per tablet Take 1 tablet by mouth every 4 (four) hours as needed for severe pain. Qty: 40 tablet, Refills: 0       Allergies  Allergen Reactions  . Codeine Nausea And Vomiting  . Other Other (See Comments)    Hair glue causes throat to close and hives   Follow-up Information    Please follow up.   Why:  [place] [facility] will call you with appointment.        Follow up with NUNDKUMAR, NEELESH, C, MD In 2 weeks.   Specialty:  Neurosurgery   Contact information:   [address] [place] [address] [phone]       Follow up with Rulon Eisenmenger, MD In 1 week.   Specialty:  Hematology and Oncology   Contact information:   [place] [address] [phone]       Follow up with [facility].   Why:  appointment 04/22/2013 at 11:30 am, bring ID, pay stubs (month), last yrs filed income tax forms  Contact information:   [place]  [phone]       The results of significant diagnostics from this hospitalization (including imaging, microbiology, ancillary and laboratory) are listed below for reference.    Significant Diagnostic Studies: Dg Chest 2 View  04/10/2014   CLINICAL DATA:  38 year old female with shortness of breath and lower extremity swelling for 2 weeks. Initial encounter.  EXAM: CHEST  2 VIEW  COMPARISON:  [facility] Chest radiographs 03/13/2011.  FINDINGS: Stable lung volumes, within normal limits. Normal cardiac size and mediastinal contours. Visualized tracheal air column is within normal limits. No pneumothorax, pulmonary edema, pleural effusion or confluent pulmonary opacity. Stable increased interstitial markings. No acute osseous abnormality identified.  IMPRESSION: No acute cardiopulmonary abnormality.   Electronically Signed   By: Lars Pinks M.D.   On: 04/10/2014 17:14   Dg Abd 1 View  04/13/2014   CLINICAL DATA:  Abdominal pain  EXAM: ABDOMEN - 1 VIEW  COMPARISON:  None.  FINDINGS: Nonobstructive bowel gas pattern.  Mild to moderate colonic stool burden.  Visualized osseous structures are within normal limits.  IMPRESSION: Unremarkable abdominal radiograph.   Electronically Signed   By: Julian Hy M.D.   On: 04/13/2014 20:58   Mr Lumbar Spine W Wo Contrast  04/11/2014   CLINICAL DATA:  38 year old female  with left side pain radiating to the flank. Symptomatic anemia. Lower extremity edema. Low back pain with left side sciatica. Initial encounter.  EXAM: MRI LUMBAR SPINE WITHOUT AND WITH CONTRAST  TECHNIQUE: Multiplanar and multiecho pulse sequences of the lumbar spine were obtained without and with intravenous contrast.  CONTRAST:  70m MULTIHANCE GADOBENATE DIMEGLUMINE 529 MG/ML IV SOLN  COMPARISON:  Lumbar radiographs 01/02/2009. Chest radiographs 03/13/2011, 12/08/2007.  FINDINGS: Demonstrated on the prior chest radiographs (better visualized in 2009) the eleventh ribs are full size, the twelfth ribs are hypoplastic. Therefore suspect there is transitional lumbosacral anatomy with a sacralized L5 level. Correlation with radiographs is recommended prior to any operative intervention.  Diffusely abnormally decreased T1 and T2 marrow signal. No marrow edema or evidence of acute osseous abnormality. Stable and normal vertebral height and alignment, trace retrolisthesis at L5-S1.  Visualized lower thoracic spinal cord is normal with conus medularis at T11-T12. No abnormal intradural enhancement.  Visualized abdominal viscera remarkable for parapelvic renal cysts on the left. The liver is dark on T2 weighted images, but has a more normal appearance on T1 weighted images. The spleen appears within normal limits. Large body habitus. Small volume of pelvic free fluid, favor physiologic in this age group. Negative visualized posterior paraspinal soft tissues.  T10-T11:  Negative.  T11-T12:  Negative.  T12-L1:  Negative.  L1-L2:  Negative.  L2-L3:  Negative.  L3-L4: Mild disc desiccation. Mild left paracentral disc protrusion best seen on series 6, image 24. Borderline to mild facet hypertrophy. Mild left lateral recess stenosis (descending left L4 nerve roots). No spinal or foraminal stenosis.  L4-L5: Mild disc desiccation. Caudal disc extrusion in the midline best seen on series 3, image 8 and series 6, image 32. Disc  material most affecting the descending left L5 nerve roots in the left lateral recess. Mild facet hypertrophy. No spinal stenosis. No L4 foraminal stenosis.  L5-S1:  Sacralized.  Negative.  IMPRESSION: 1. 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 Correlation with radiographs is recommended prior to any operative intervention. 2. Caudal disc herniation at L4-L5 most  affecting did descending left L5 nerve roots in the lateral recess. No spinal stenosis. 3. Small L3-L4 left paracentral disc herniation most affecting the descending left L4 nerve roots in the left lateral recess. 4. Diffusely abnormal bone marrow signal compatible with stated "Symptomatic anemia" (red marrow reactivation). 5. Small volume pelvic free fluid likely is physiologic.   Electronically Signed   By: Lars Pinks M.D.   On: 04/11/2014 14:06    Microbiology: No results found for this or any previous visit (from the past 240 hour(s)).   Labs: Basic Metabolic Panel:  Recent Labs Lab 04/10/14 1630 04/12/14 0749  NA 137 137  K 3.8 3.8  CL 111 109  CO2 21 22  GLUCOSE 117* 103*  BUN 13 12  CREATININE 0.69 0.59  CALCIUM 8.8 8.8   Liver Function Tests:  Recent Labs Lab 04/10/14 1630 04/11/14 1325 04/12/14 0749  AST 62*  --  77*  ALT 36*  --  41*  ALKPHOS 51  --  52  BILITOT 0.6 0.6 0.4  PROT 6.7  --  6.2  ALBUMIN 4.1  --  3.7   No results for input(s): LIPASE, AMYLASE in the last 168 hours. No results for input(s): AMMONIA in the last 168 hours. CBC:  Recent Labs Lab 04/10/14 1630 04/11/14 1325 04/12/14 0749 04/12/14 2353 04/13/14 0406 04/13/14 1029  WBC 7.7 6.0 5.1 5.1 5.1 4.6  NEUTROABS 4.4  --   --   --   --   --   HGB 5.4* 7.3* 7.2* 7.2* 7.1* 8.1*  HCT 16.9* 22.8* 22.4* 22.6* 22.0* 24.8*  MCV 85.4 82.3 80.9 80.7 82.7 82.1  PLT 213 154 160 174 136* 140*   Cardiac Enzymes: No results for input(s): CKTOTAL, CKMB, CKMBINDEX, TROPONINI in the last  168 hours. BNP: BNP (last 3 results) No results for input(s): PROBNP in the last 8760 hours. CBG: No results for input(s): GLUCAP in the last 168 hours.     SignedNiel Hummer A  [facility] 04/14/2014, 12:48 PM

## 2015-09-15 DIAGNOSIS — D649 Anemia, unspecified: Secondary | ICD-10-CM

## 2015-09-15 HISTORY — DX: Anemia, unspecified: D64.9

## 2015-10-03 ENCOUNTER — Emergency Department (HOSPITAL_COMMUNITY): Payer: Medicaid Other

## 2015-10-03 ENCOUNTER — Other Ambulatory Visit: Payer: Self-pay

## 2015-10-03 ENCOUNTER — Encounter (HOSPITAL_COMMUNITY): Payer: Self-pay | Admitting: Emergency Medicine

## 2015-10-03 ENCOUNTER — Observation Stay (HOSPITAL_COMMUNITY)
Admission: EM | Admit: 2015-10-03 | Discharge: 2015-10-04 | Disposition: A | Discharge status: Home / Self Care | Payer: Medicaid Other | Attending: Internal Medicine | Admitting: Internal Medicine

## 2015-10-03 DIAGNOSIS — R0602 Shortness of breath: Secondary | ICD-10-CM

## 2015-10-03 DIAGNOSIS — M549 Dorsalgia, unspecified: Secondary | ICD-10-CM

## 2015-10-03 DIAGNOSIS — R748 Abnormal levels of other serum enzymes: Secondary | ICD-10-CM | POA: Diagnosis not present

## 2015-10-03 DIAGNOSIS — M5442 Lumbago with sciatica, left side: Secondary | ICD-10-CM | POA: Diagnosis present

## 2015-10-03 DIAGNOSIS — D696 Thrombocytopenia, unspecified: Secondary | ICD-10-CM | POA: Insufficient documentation

## 2015-10-03 DIAGNOSIS — J45998 Other asthma: Secondary | ICD-10-CM | POA: Diagnosis not present

## 2015-10-03 DIAGNOSIS — Z79899 Other long term (current) drug therapy: Secondary | ICD-10-CM | POA: Diagnosis not present

## 2015-10-03 DIAGNOSIS — D519 Vitamin B12 deficiency anemia, unspecified: Secondary | ICD-10-CM | POA: Diagnosis not present

## 2015-10-03 DIAGNOSIS — E876 Hypokalemia: Secondary | ICD-10-CM | POA: Insufficient documentation

## 2015-10-03 DIAGNOSIS — G8929 Other chronic pain: Secondary | ICD-10-CM | POA: Diagnosis not present

## 2015-10-03 DIAGNOSIS — F17210 Nicotine dependence, cigarettes, uncomplicated: Secondary | ICD-10-CM | POA: Insufficient documentation

## 2015-10-03 DIAGNOSIS — R600 Localized edema: Secondary | ICD-10-CM | POA: Diagnosis not present

## 2015-10-03 DIAGNOSIS — D649 Anemia, unspecified: Secondary | ICD-10-CM

## 2015-10-03 DIAGNOSIS — Z9119 Patient's noncompliance with other medical treatment and regimen: Secondary | ICD-10-CM | POA: Diagnosis not present

## 2015-10-03 HISTORY — DX: Anemia, unspecified: D64.9

## 2015-10-03 LAB — COMPREHENSIVE METABOLIC PANEL
ALBUMIN: 4 g/dL (ref 3.5–5.0)
ALT: 55 U/L — ABNORMAL HIGH (ref 14–54)
AST: 112 U/L — ABNORMAL HIGH (ref 15–41)
Alkaline Phosphatase: 52 U/L (ref 38–126)
Anion gap: 8 (ref 5–15)
BILIRUBIN TOTAL: 0.6 mg/dL (ref 0.3–1.2)
BUN: 10 mg/dL (ref 6–20)
CO2: 18 mmol/L — ABNORMAL LOW (ref 22–32)
Calcium: 8.9 mg/dL (ref 8.9–10.3)
Chloride: 112 mmol/L — ABNORMAL HIGH (ref 101–111)
Creatinine, Ser: 0.72 mg/dL (ref 0.44–1.00)
GFR calc Af Amer: >60 mL/min (ref >60)
GFR calc non Af Amer: >60 mL/min (ref >60)
GLUCOSE: 151 mg/dL — AB (ref 65–99)
Potassium: 3 mmol/L — ABNORMAL LOW (ref 3.5–5.1)
Sodium: 138 mmol/L (ref 135–145)
TOTAL PROTEIN: 6.5 g/dL (ref 6.5–8.1)

## 2015-10-03 LAB — CBC WITH DIFFERENTIAL/PLATELET
BASOS PCT: 0 %
Basophils Absolute: 0 10*3/uL (ref 0.0–0.1)
EOS PCT: 0 %
Eosinophils Absolute: 0 10*3/uL (ref 0.0–0.7)
HEMATOCRIT: 16 % — AB (ref 36.0–46.0)
HEMOGLOBIN: 4.9 g/dL — AB (ref 12.0–15.0)
LYMPHS PCT: 15 %
Lymphs Abs: 0.9 10*3/uL (ref 0.7–4.0)
MCH: 24.4 pg — ABNORMAL LOW (ref 26.0–34.0)
MCHC: 30.6 g/dL (ref 30.0–36.0)
MCV: 79.6 fL (ref 78.0–100.0)
MONOS PCT: 1 %
Monocytes Absolute: 0.1 10*3/uL (ref 0.1–1.0)
NEUTROS PCT: 84 %
Neutro Abs: 4.9 10*3/uL (ref 1.7–7.7)
Platelets: 91 10*3/uL — ABNORMAL LOW (ref 150–400)
RBC: 2.01 MIL/uL — AB (ref 3.87–5.11)
WBC: 5.9 10*3/uL (ref 4.0–10.5)

## 2015-10-03 LAB — URINALYSIS, ROUTINE W REFLEX MICROSCOPIC
BILIRUBIN URINE: NEGATIVE
Glucose, UA: NEGATIVE mg/dL
Ketones, ur: NEGATIVE mg/dL
Leukocytes, UA: NEGATIVE
NITRITE: NEGATIVE
Protein, ur: 30 mg/dL — AB
SPECIFIC GRAVITY, URINE: 1.016 (ref 1.005–1.030)
pH: 7.5 (ref 5.0–8.0)

## 2015-10-03 LAB — URINE MICROSCOPIC-ADD ON

## 2015-10-03 LAB — PREPARE RBC (CROSSMATCH)

## 2015-10-03 LAB — TROPONIN I: Troponin I: <0.03 ng/mL (ref <0.031)

## 2015-10-03 LAB — PREGNANCY, URINE: PREG TEST UR: NEGATIVE

## 2015-10-03 MED ORDER — HYDROCODONE-ACETAMINOPHEN 5-325 MG PO TABS: 1.0000 | ORAL_TABLET | ORAL | Status: DC | PRN

## 2015-10-03 MED ORDER — CYANOCOBALAMIN 1000 MCG/ML IJ SOLN
1000.0000 ug | Freq: Every day | INTRAMUSCULAR | Status: DC
  Filled 2015-10-03: qty 1

## 2015-10-03 MED ORDER — SODIUM CHLORIDE 0.9 % IV SOLN
10.0000 mL/h | Freq: Once | INTRAVENOUS | Status: AC
  Administered 2015-10-03: 10 mL/h via INTRAVENOUS

## 2015-10-03 MED ORDER — MORPHINE SULFATE ER 15 MG PO TBCR: 15.0000 mg | EXTENDED_RELEASE_TABLET | Freq: Two times a day (BID) | ORAL | Status: DC

## 2015-10-03 MED ORDER — CYCLOBENZAPRINE HCL 10 MG PO TABS: 5.0000 mg | ORAL_TABLET | Freq: Three times a day (TID) | ORAL | Status: DC | PRN

## 2015-10-03 MED ORDER — SODIUM CHLORIDE 0.9% FLUSH
3.0000 mL | Freq: Two times a day (BID) | INTRAVENOUS | Status: DC
  Administered 2015-10-03: 3 mL via INTRAVENOUS

## 2015-10-03 MED ORDER — POLYETHYLENE GLYCOL 3350 17 G PO PACK
17.0000 g | PACK | Freq: Two times a day (BID) | ORAL | Status: DC
  Administered 2015-10-04: 17 g via ORAL
  Filled 2015-10-03: qty 1

## 2015-10-03 MED ORDER — ONDANSETRON HCL 4 MG PO TABS: 4.0000 mg | ORAL_TABLET | Freq: Four times a day (QID) | ORAL | Status: DC | PRN

## 2015-10-03 MED ORDER — SENNOSIDES-DOCUSATE SODIUM 8.6-50 MG PO TABS
1.0000 | ORAL_TABLET | Freq: Two times a day (BID) | ORAL | Status: DC
  Administered 2015-10-03 – 2015-10-04 (×2): 1 via ORAL
  Filled 2015-10-03 (×2): qty 1

## 2015-10-03 MED ORDER — POTASSIUM CHLORIDE CRYS ER 20 MEQ PO TBCR
40.0000 meq | EXTENDED_RELEASE_TABLET | Freq: Four times a day (QID) | ORAL | Status: AC
  Administered 2015-10-03 – 2015-10-04 (×2): 40 meq via ORAL
  Filled 2015-10-03 (×3): qty 2

## 2015-10-03 MED ORDER — FAMOTIDINE IN NACL 20-0.9 MG/50ML-% IV SOLN
20.0000 mg | Freq: Two times a day (BID) | INTRAVENOUS | Status: DC
  Administered 2015-10-03: 20 mg via INTRAVENOUS
  Filled 2015-10-03 (×3): qty 50

## 2015-10-03 MED ORDER — CYANOCOBALAMIN 1000 MCG/ML IJ SOLN: 1000.0000 ug | INTRAMUSCULAR | Status: DC

## 2015-10-03 MED ORDER — METHYLPREDNISOLONE SODIUM SUCC 125 MG IJ SOLR
60.0000 mg | Freq: Three times a day (TID) | INTRAMUSCULAR | Status: DC
  Administered 2015-10-03: 60 mg via INTRAVENOUS
  Filled 2015-10-03 (×2): qty 2

## 2015-10-03 MED ORDER — ONDANSETRON HCL 4 MG/2ML IJ SOLN
4.0000 mg | Freq: Four times a day (QID) | INTRAMUSCULAR | Status: DC | PRN
Start: 2015-10-03 — End: 2015-10-04

## 2015-10-03 MED ORDER — TIOTROPIUM BROMIDE MONOHYDRATE 18 MCG IN CAPS
18.0000 ug | ORAL_CAPSULE | Freq: Every day | RESPIRATORY_TRACT | Status: DC
  Filled 2015-10-03: qty 5

## 2015-10-03 MED ORDER — FOLIC ACID 1 MG PO TABS
3.0000 mg | ORAL_TABLET | Freq: Every day | ORAL | Status: DC
  Administered 2015-10-04: 3 mg via ORAL
  Filled 2015-10-03: qty 3

## 2015-10-03 MED ORDER — ALBUTEROL SULFATE (2.5 MG/3ML) 0.083% IN NEBU: 2.5000 mg | INHALATION_SOLUTION | RESPIRATORY_TRACT | Status: DC | PRN

## 2015-10-03 MED ORDER — PANTOPRAZOLE SODIUM 40 MG PO TBEC
40.0000 mg | DELAYED_RELEASE_TABLET | Freq: Two times a day (BID) | ORAL | Status: DC
  Administered 2015-10-03 – 2015-10-04 (×2): 40 mg via ORAL
  Filled 2015-10-03 (×2): qty 1

## 2015-10-03 MED ORDER — MOMETASONE FURO-FORMOTEROL FUM 200-5 MCG/ACT IN AERO
2.0000 | INHALATION_SPRAY | Freq: Two times a day (BID) | RESPIRATORY_TRACT | Status: DC
  Filled 2015-10-03: qty 8.8

## 2015-10-03 NOTE — ED Notes (Signed)
Report attempted 

## 2015-10-03 NOTE — H&P (Addendum)
TRH H&P   Patient Demographics:    Courtney Davies, is a 40 y.o. female  MRN: [id number]   DOB - [birth year]  Admit Date - 10/03/2015  Outpatient Primary MD - goes to [facility] clinic  Patient coming from: Home/ PCP office  Chief Complaint  Patient presents with  . Shortness of Breath      HPI:    Courtney Davies  is a 40 y.o. female,  With H/O severe B12 deficiency anemia needing transfusions several times, nicotine and marijuana abuse, chronic back pain out of narcotics for months, mildly elevated Liver enzymes, who went to [facility] clinic with complaints of fatigue and SOB, was found to have a HB of 5, sent to the ER.  In the ER HB 4.9, patient denied any bleeding or melena, no heavy menses, says she does not take B12 shots or any regular meds says her NP would not prescribe, no other symptoms except above, during my interview patient remained on cell phone and talking to different friends, in no distress, refused rectal exam.    Review of systems:    In addition to the HPI above,   No Fever-chills, No Headache, No changes with Vision or hearing, No problems swallowing food or Liquids, No Chest pain, Cough , as above Shortness of Breath, No Abdominal pain, No Nausea or Vommitting, Bowel movements are regular, No Blood in stool or Urine, No dysuria, No new skin rashes or bruises, No new joints pains-aches,  No new weakness, tingling, numbness in any extremity,+ve Gen weakness No recent weight gain or loss, No polyuria, polydypsia or polyphagia, No significant Mental Stressors.  A full 10 point Review of Systems was done, except as stated above, all other Review of Systems were negative.   With Past History of the  following :    Past Medical History  Diagnosis Date  . Asthma     as a child      Past Surgical History  Procedure Laterality Date  . Cesarean section      X 2  . Hernia repair        Social History:     Social History  Substance Use Topics  . Smoking status: Current Every Day Smoker -- 0.50 packs/day    Types: Cigarettes  . Smokeless tobacco: Never Used  . Alcohol Use: No  Lives - at home, mobile and active      Family History :     Family History  Problem Relation Age of Onset  . Anemia      Dad's side  . Valvular heart disease      Dad's side       Home Medications:   Prior to Admission medications   Medication Sig Start Date End Date Taking? Authorizing Provider  levofloxacin (LEVAQUIN) 500 MG tablet Take 500 mg by mouth daily. For 7 days. Started on 09-19-15.   Yes Historical Provider, MD  cyanocobalamin (,VITAMIN B-12,) 1000 MCG/ML injection Inject 1 mL (1,000 mcg total) into the muscle once a week. Inject once a week for 4 weeks, then monthly. 04/13/14   Belkys A Regalado, MD  cyclobenzaprine (FLEXERIL) 5 MG tablet Take 1 tablet (5 mg total) by mouth 3 (three) times daily as needed for muscle spasms (leg pain, back pain). 04/13/14   Belkys A Regalado, MD  folic acid (FOLVITE) 1 MG tablet Take 3 tablets (3 mg total) by mouth daily. 04/13/14   Belkys A Regalado, MD  morphine (MS CONTIN) 15 MG 12 hr tablet Take 1 tablet (15 mg total) by mouth every 12 (twelve) hours. 04/14/14   Belkys A Regalado, MD  oxyCODONE-acetaminophen (ROXICET) 5-325 MG per tablet Take 1 tablet by mouth every 4 (four) hours as needed for severe pain. 04/14/14   Belkys A Regalado, MD  pantoprazole (PROTONIX) 40 MG tablet Take 1 tablet (40 mg total) by mouth 2 (two) times daily. 04/13/14   Belkys A Regalado, MD  polyethylene glycol (MIRALAX / GLYCOLAX) packet Take 17 g by mouth 2 (two) times daily. 04/13/14   Belkys A Regalado, MD  senna-docusate (SENOKOT-S) 8.6-50 MG per tablet Take 1  tablet by mouth 2 (two) times daily. 04/14/14   Belkys A Regalado, MD  traMADol (ULTRAM) 50 MG tablet Take 1 tablet (50 mg total) by mouth every 8 (eight) hours as needed for moderate pain. 04/13/14   Elmarie Shiley, MD     Allergies:     Allergies  Allergen Reactions  . Codeine Nausea And Vomiting  . Other Other (See Comments)    Hair glue causes throat to close and hives     Physical Exam:   Vitals  Blood pressure 119/71, pulse 95, temperature 98.5 F (36.9 C), temperature source Oral, resp. rate 24, last menstrual period 12/03/2012, SpO2 100 %.   1. General young Obese AA female lying in bed in NAD,    2. Normal affect and insight, Not Suicidal or Homicidal, Awake Alert, Oriented X 3.  3. No F.N deficits, ALL C.Nerves Intact, Strength 5/5 all 4 extremities, Sensation intact all 4 extremities, Plantars down going.  4. Ears and Eyes appear Normal, Conjunctivae clear, PERRLA. Moist Oral Mucosa.  5. Supple Neck, No JVD, No cervical lymphadenopathy appriciated, No Carotid Bruits.  6. Symmetrical Chest wall movement, Good air movement bilaterally, +ve wheezing  7. RRR, No Gallops, Rubs or Murmurs, No Parasternal Heave.  8. Positive Bowel Sounds, Abdomen Soft, No tenderness, No organomegaly appriciated,No rebound -guarding or rigidity.  9.  No Cyanosis, Normal Skin Turgor, No Skin Rash or Bruise.  10. Good muscle tone,  joints appear normal , no effusions, Normal ROM.  11. No Palpable Lymph Nodes in Neck or Axillae      Data Review:    CBC  Recent Labs Lab 10/03/15 1617  WBC 5.9  HGB 4.9*  HCT 16.0*  PLT 91*  MCV 79.6  MCH 24.4*  MCHC 30.6  RDW NOT CALCULATED  LYMPHSABS 0.9  MONOABS 0.1  EOSABS 0.0  BASOSABS 0.0   ------------------------------------------------------------------------------------------------------------------  Chemistries   Recent Labs Lab 10/03/15 1455  NA 138  K 3.0*  CL 112*  CO2 18*  GLUCOSE 151*  BUN 10  CREATININE  0.72  CALCIUM 8.9  AST 112*  ALT 55*  ALKPHOS 52  BILITOT 0.6   ------------------------------------------------------------------------------------------------------------------ CrCl cannot be calculated (Unknown ideal weight.). ------------------------------------------------------------------------------------------------------------------ No results for input(s): TSH, T4TOTAL, T3FREE, THYROIDAB in the last 72 hours.  Invalid input(s): FREET3  Coagulation profile No results for input(s): INR, PROTIME in the last 168 hours. ------------------------------------------------------------------------------------------------------------------- No results for input(s): DDIMER in the last 72 hours. -------------------------------------------------------------------------------------------------------------------  Cardiac Enzymes  Recent Labs Lab 10/03/15 1455  TROPONINI <0.03   ------------------------------------------------------------------------------------------------------------------    Component Value Date/Time   BNP 50.7 04/10/2014 1630     ---------------------------------------------------------------------------------------------------------------  Urinalysis    Component Value Date/Time   COLORURINE YELLOW 10/03/2015 [place] 10/03/2015 1608   LABSPEC 1.016 10/03/2015 1608   PHURINE 7.5 10/03/2015 1608   GLUCOSEU NEGATIVE 10/03/2015 1608   HGBUR TRACE* 10/03/2015 1608   [place] 10/03/2015 1608   KETONESUR NEGATIVE 10/03/2015 1608   PROTEINUR 30* 10/03/2015 1608   UROBILINOGEN 1.0 02/21/2014 1544   NITRITE NEGATIVE 10/03/2015 1608   LEUKOCYTESUR NEGATIVE 10/03/2015 1608    ----------------------------------------------------------------------------------------------------------------   Imaging Results:    Dg Chest 2 View  10/03/2015  CLINICAL DATA:  Shortness of breath, weakness. EXAM: CHEST  2 VIEW COMPARISON:  Earlier  today at outside institution FINDINGS: Heart is upper limits normal in size. Lungs are clear. No effusions. No acute bony abnormality. IMPRESSION: No active cardiopulmonary disease. Electronically Signed   By: Rolm Baptise M.D.   On: 10/03/2015 15:29    My personal review of EKG: Rhythm NSR,  no Acute ST changes   Assessment & Plan:     1. Symptomatic Anemia with H/O B12 deficiency who is non complaint with her shots - denies any blood in stool or Melena, no heavy menses, refused rectal exam to EDP - will keep in Tele obs, An.Panel, 3 units PRBC, PPI PO + Zantac IV ( IV PPI back ordered), IM B12 shots, she has seen Dr Lindi Adie in the past, will follow with him post DC.  2. Exertional SOB not hypoxic - due to #1 above + mild SOB exacerbation, as above + IV Steroids, Nebs, no o2 need, counseled to quit smoking.  3.Chr back pain - out of narcotics for months, has seen Dr Kathyrn Sheriff, supportive Rx, told no IV Narcotics will be given.  4. Smoking Nicotene and Marijuana - counseled to stop, UDS check.  5. Mild Thrombocytopenia and elevated AST (chronic) - peripheral smear, Ac.Hep panel, RUQ [place], hemolysis and Thrombocytopenia can be from low B12, check LDH and Haptoglobin.  If stable follow with Dr Lindi Adie.   DVT Prophylaxis   SCDs    AM Labs Ordered, also please review Full Orders  Family Communication: Admission, patients condition and plan of care including tests being ordered have been discussed with the patient  who indicates understanding and agree with the plan and Code Status.  Code Status Full  Likely DC to  Home in am  Condition Fair  Consults called: None  Admission status: Obs    Time spent in minutes : 30   Lala Lund K M.D on 10/03/2015 at 6:39 PM  Between 7am to 7pm - Pager -  [phone]. After 7pm go to [email or web address] - password [facility]  [facility] - Office  [phone]

## 2015-10-03 NOTE — ED Notes (Signed)
Pt went to [facility] today because she felt weak had some exertional sob her hgb there was 5. Pt has also been fighting a cold for the past 3 weeks with a cough and wheezing, white and yellow sputum given 63m albuteral (total ems)and `1/2 atrovent per ems NOT including 2 albuteral at the clinic, also biven 125  Solumedrol amd 325 asa

## 2015-10-03 NOTE — ED Provider Notes (Signed)
CSN: [id number]     Arrival date & time 10/03/15  1351 History   First MD Initiated Contact with Patient 10/03/15 1351     Chief Complaint  Patient presents with  . Shortness of Breath   [name] WITH A HX OF B12 ANEMIA.  SHE INITIALLY PRESENTED AT HER PCP OFFICE IN [place].  SHE WAS GIVEN 2 ALBUTEROL NEBS AT THE CLINIC.  THEY ALSO CHECKED HER HGB AND IT WAS 5.  PT GIVEN 125 MG OF SOLU MEDROL AND AN ADDITIONAL 10 MG ALB EN ROUTE.  PT ADMITTED IN December OF 2015 WITH SYMPTOMATIC ANEMIA.  SHE WAS N OT ABLE TO GET ANY OF HER MEDS FILLED OR F/U WITH A DR UNTIL NOW DUE TO LACK OF INSURANCE.  SHE NOW HAS MEDICAID, SO WENT TO THE DR TODAY.  (Consider location/radiation/quality/duration/timing/severity/associated sxs/prior Treatment) Patient is a 40 y.o. female presenting with shortness of breath. The history is provided by the patient.  Shortness of Breath Severity:  Moderate Onset quality:  Gradual Timing:  Constant Progression:  Worsening   Past Medical History  Diagnosis Date  . Asthma     as a child   Past Surgical History  Procedure Laterality Date  . Cesarean section      X 2  . Hernia repair     Family History  Problem Relation Age of Onset  . Anemia      Dad's side  . Valvular heart disease      Dad's side   Social History  Substance Use Topics  . Smoking status: Current Every Day Smoker -- 0.50 packs/day    Types: Cigarettes  . Smokeless tobacco: Never Used  . Alcohol Use: No   OB History    No data available     Review of Systems  Respiratory: Positive for shortness of breath.   All other systems reviewed and are negative.     Allergies  Codeine and Other  Home Medications   Prior to Admission medications   Medication Sig Start Date End Date Taking? Authorizing Provider  levofloxacin (LEVAQUIN) 500 MG tablet Take 500 mg by mouth daily. For 7 days. Started on 09-19-15.   Yes Historical Provider, MD  cyanocobalamin (,VITAMIN B-12,) 1000 MCG/ML  injection Inject 1 mL (1,000 mcg total) into the muscle once a week. Inject once a week for 4 weeks, then monthly. 04/13/14   Belkys A Regalado, MD  cyclobenzaprine (FLEXERIL) 5 MG tablet Take 1 tablet (5 mg total) by mouth 3 (three) times daily as needed for muscle spasms (leg pain, back pain). 04/13/14   Belkys A Regalado, MD  folic acid (FOLVITE) 1 MG tablet Take 3 tablets (3 mg total) by mouth daily. 04/13/14   Belkys A Regalado, MD  morphine (MS CONTIN) 15 MG 12 hr tablet Take 1 tablet (15 mg total) by mouth every 12 (twelve) hours. 04/14/14   Belkys A Regalado, MD  oxyCODONE-acetaminophen (ROXICET) 5-325 MG per tablet Take 1 tablet by mouth every 4 (four) hours as needed for severe pain. 04/14/14   Belkys A Regalado, MD  pantoprazole (PROTONIX) 40 MG tablet Take 1 tablet (40 mg total) by mouth 2 (two) times daily. 04/13/14   Belkys A Regalado, MD  polyethylene glycol (MIRALAX / GLYCOLAX) packet Take 17 g by mouth 2 (two) times daily. 04/13/14   Belkys A Regalado, MD  senna-docusate (SENOKOT-S) 8.6-50 MG per tablet Take 1 tablet by mouth 2 (two) times daily. 04/14/14   Belkys  A Regalado, MD  traMADol (ULTRAM) 50 MG tablet Take 1 tablet (50 mg total) by mouth every 8 (eight) hours as needed for moderate pain. 04/13/14   Belkys A Regalado, MD   BP 122/70 mmHg  Pulse 67  Temp(Src) 98.5 F (36.9 C) (Oral)  Resp 20  Ht 5' 5"  (1.651 m)  Wt 202 lb 9.6 oz (91.899 kg)  BMI 33.71 kg/m2  SpO2 100%  LMP 12/03/2012 Physical Exam  Constitutional: She is oriented to person, place, and time. She appears well-developed and well-nourished.  HENT:  Head: Normocephalic and atraumatic.  Right Ear: External ear normal.  Left Ear: External ear normal.  Nose: Nose normal.  Mouth/Throat: Oropharynx is clear and moist.  Eyes: Conjunctivae and EOM are normal. Pupils are equal, round, and reactive to light.  Neck: Normal range of motion. Neck supple.  Cardiovascular: Normal heart sounds and intact distal  pulses.  Tachycardia present.   Pulmonary/Chest: Effort normal and breath sounds normal.  Abdominal: Soft. Bowel sounds are normal.  Musculoskeletal: Normal range of motion.  Neurological: She is alert and oriented to person, place, and time.  Skin: Skin is warm and dry.  Psychiatric: She has a normal mood and affect. Her behavior is normal. Judgment and thought content normal.  Nursing note and vitals reviewed.   ED Course  Procedures (including critical care time) Labs Review Labs Reviewed  COMPREHENSIVE METABOLIC PANEL - Abnormal; Notable for the following:    Potassium 3.0 (*)    Chloride 112 (*)    CO2 18 (*)    Glucose, Bld 151 (*)    AST 112 (*)    ALT 55 (*)    All other components within normal limits  URINALYSIS, ROUTINE W REFLEX MICROSCOPIC (NOT AT [facility]) - Abnormal; Notable for the following:    Hgb urine dipstick TRACE (*)    Protein, ur 30 (*)    All other components within normal limits  CBC WITH DIFFERENTIAL/PLATELET - Abnormal; Notable for the following:    RBC 2.01 (*)    Hemoglobin 4.9 (*)    HCT 16.0 (*)    MCH 24.4 (*)    Platelets 91 (*)    All other components within normal limits  URINE MICROSCOPIC-ADD ON - Abnormal; Notable for the following:    Squamous Epithelial / LPF 0-5 (*)    Bacteria, UA FEW (*)    Casts HYALINE CASTS (*)    All other components within normal limits  BASIC METABOLIC PANEL - Abnormal; Notable for the following:    CO2 19 (*)    Glucose, Bld 252 (*)    All other components within normal limits  CBC - Abnormal; Notable for the following:    RBC 2.58 (*)    Hemoglobin 6.9 (*)    HCT 21.5 (*)    RDW 26.6 (*)    Platelets 91 (*)    All other components within normal limits  URINE RAPID DRUG SCREEN, HOSP PERFORMED - Abnormal; Notable for the following:    Tetrahydrocannabinol POSITIVE (*)    All other components within normal limits  VITAMIN B12 - Abnormal; Notable for the following:    Vitamin B-12 52 (*)    All other  components within normal limits  IRON AND TIBC - Abnormal; Notable for the following:    Iron 229 (*)    Saturation Ratios 87 (*)    All other components within normal limits  FERRITIN - Abnormal; Notable for the following:    Ferritin  1072 (*)    All other components within normal limits  RETICULOCYTES - Abnormal; Notable for the following:    Retic Ct Pct <0.4 (*)    RBC. 2.62 (*)    All other components within normal limits  TROPONIN I  PREGNANCY, URINE  MAGNESIUM  TSH  CBC WITH DIFFERENTIAL/PLATELET  PATHOLOGIST SMEAR REVIEW  FOLATE  HAPTOGLOBIN  HEPATITIS PANEL, ACUTE  LACTATE DEHYDROGENASE  HEMOGLOBIN A1C  POC OCCULT BLOOD, ED  TYPE AND SCREEN  PREPARE RBC (CROSSMATCH)  PREPARE RBC (CROSSMATCH)    Imaging Review Dg Chest 2 View  10/03/2015  CLINICAL DATA:  Shortness of breath, weakness. EXAM: CHEST  2 VIEW COMPARISON:  Earlier today at outside institution FINDINGS: Heart is upper limits normal in size. Lungs are clear. No effusions. No acute bony abnormality. IMPRESSION: No active cardiopulmonary disease. Electronically Signed   By: Rolm Baptise M.D.   On: 10/03/2015 15:29   I have personally reviewed and evaluated these images and lab results as part of my medical decision-making.   EKG Interpretation   Date/Time:  Monday October 03 2015 15:52:36 EDT Ventricular Rate:  98 PR Interval:    QRS Duration: 74 QT Interval:  368 QTC Calculation: 470 R Axis:   87 Text Interpretation:  Normal sinus rhythm Poor R wave progression No  significant change since last tracing Confirmed by RAY MD, DANIELLE  ([id number]) on 10/03/2015 4:49:42 PM      MDM  PT REFUSED RECTAL EXAM.  SHE SAID THAT SHE KNOWS IT IS FROM HER B12.  STILL AWAITING CBC, SO THE PT WILL BE SIGNED OUT TO DR. RAY. Final diagnoses:  Elevated liver enzymes   ANEMIA HYPOKALEMIA    Isla Pence, MD 10/04/15 [phone]

## 2015-10-03 NOTE — ED Provider Notes (Addendum)
40 y.o. Female history of anemia due to b12 deficiency with increased weakness and anemia sent to ed for transfusion.  Partient signed out to me pending results of cbc for admission for transfusion if hgb <6.  Hgb is 4.9 here and patient borderline tachycardiac at rest.  One u prbc ordered. hospitalist paged.  Pattricia Boss, MD 10/03/15 1809  Discussed with Dr. Candiss Norse and requests rectal exam.  Patient refuses rectals- states she knows it is due to b12 deficiency as she has not had shot for 18 months due to insurance.  S/p hysterectomy, denies blood in stool, tarry stool, hematemesis, coffee ground emesis, hematuria, or vaginal bleeding.   Pattricia Boss, MD 10/03/15 [phone]

## 2015-10-03 NOTE — ED Notes (Signed)
Pt refused occult blood test

## 2015-10-04 ENCOUNTER — Observation Stay (HOSPITAL_COMMUNITY): Payer: Medicaid Other

## 2015-10-04 ENCOUNTER — Encounter (HOSPITAL_COMMUNITY): Payer: Self-pay | Admitting: General Practice

## 2015-10-04 DIAGNOSIS — R0602 Shortness of breath: Secondary | ICD-10-CM | POA: Diagnosis not present

## 2015-10-04 LAB — RAPID URINE DRUG SCREEN, HOSP PERFORMED
Amphetamines: NOT DETECTED
BENZODIAZEPINES: NOT DETECTED
Barbiturates: NOT DETECTED
Cocaine: NOT DETECTED
Opiates: NOT DETECTED
Tetrahydrocannabinol: POSITIVE — AB

## 2015-10-04 LAB — CBC
HEMATOCRIT: 21.5 % — AB (ref 36.0–46.0)
HEMOGLOBIN: 6.9 g/dL — AB (ref 12.0–15.0)
MCH: 26.7 pg (ref 26.0–34.0)
MCHC: 32.1 g/dL (ref 30.0–36.0)
MCV: 83.3 fL (ref 78.0–100.0)
Platelets: 91 10*3/uL — ABNORMAL LOW (ref 150–400)
RBC: 2.58 MIL/uL — ABNORMAL LOW (ref 3.87–5.11)
RDW: 26.6 % — AB (ref 11.5–15.5)
WBC: 5.9 10*3/uL (ref 4.0–10.5)

## 2015-10-04 LAB — PREPARE RBC (CROSSMATCH)

## 2015-10-04 LAB — IRON AND TIBC
IRON: 229 ug/dL — AB (ref 28–170)
SATURATION RATIOS: 87 % — AB (ref 10.4–31.8)
TIBC: 265 ug/dL (ref 250–450)
UIBC: 36 ug/dL

## 2015-10-04 LAB — BASIC METABOLIC PANEL
Anion gap: 8 (ref 5–15)
BUN: 9 mg/dL (ref 6–20)
CALCIUM: 9.7 mg/dL (ref 8.9–10.3)
CO2: 19 mmol/L — AB (ref 22–32)
Chloride: 108 mmol/L (ref 101–111)
Creatinine, Ser: 0.82 mg/dL (ref 0.44–1.00)
GFR calc Af Amer: >60 mL/min (ref >60)
GLUCOSE: 252 mg/dL — AB (ref 65–99)
POTASSIUM: 4.8 mmol/L (ref 3.5–5.1)
Sodium: 135 mmol/L (ref 135–145)

## 2015-10-04 LAB — RETICULOCYTES
RBC.: 2.62 MIL/uL — AB (ref 3.87–5.11)
Retic Ct Pct: <0.4 % — ABNORMAL LOW (ref 0.4–3.1)

## 2015-10-04 LAB — VITAMIN B12: VITAMIN B 12: 52 pg/mL — AB (ref 180–914)

## 2015-10-04 LAB — TSH: TSH: 0.701 u[IU]/mL (ref 0.350–4.500)

## 2015-10-04 LAB — FERRITIN: Ferritin: 1072 ng/mL — ABNORMAL HIGH (ref 11–307)

## 2015-10-04 LAB — MAGNESIUM: MAGNESIUM: 1.9 mg/dL (ref 1.7–2.4)

## 2015-10-04 MED ORDER — CYANOCOBALAMIN 1000 MCG/ML IJ SOLN: INTRAMUSCULAR | Status: DC

## 2015-10-04 MED ORDER — FOLIC ACID 1 MG PO TABS: 1.0000 mg | ORAL_TABLET | Freq: Every day | ORAL | Status: DC

## 2015-10-04 MED ORDER — "INSULIN SYRINGE-NEEDLE U-100 25G X 1"" 1 ML MISC": Status: DC

## 2015-10-04 MED ORDER — TIOTROPIUM BROMIDE MONOHYDRATE 18 MCG IN CAPS
18.0000 ug | ORAL_CAPSULE | Freq: Every day | RESPIRATORY_TRACT | Status: DC
  Filled 2015-10-04: qty 5

## 2015-10-04 MED ORDER — PANTOPRAZOLE SODIUM 40 MG PO TBEC: 40.0000 mg | DELAYED_RELEASE_TABLET | Freq: Every day | ORAL | Status: DC

## 2015-10-04 MED ORDER — SODIUM CHLORIDE 0.9 % IV SOLN: Freq: Once | INTRAVENOUS | Status: DC

## 2015-10-04 MED ORDER — METHYLPREDNISOLONE 4 MG PO TBPK: ORAL_TABLET | ORAL | Status: DC

## 2015-10-04 MED ORDER — ALBUTEROL SULFATE HFA 108 (90 BASE) MCG/ACT IN AERS: 2.0000 | INHALATION_SPRAY | Freq: Four times a day (QID) | RESPIRATORY_TRACT | Status: DC | PRN

## 2015-10-04 MED ORDER — SODIUM CHLORIDE 0.9 % IV SOLN
Freq: Once | INTRAVENOUS | Status: AC
  Administered 2015-10-04: 04:00:00 via INTRAVENOUS

## 2015-10-04 NOTE — Progress Notes (Signed)
Pt administered 2 units of blood. Unable to transfuse 3rd unit per blood bank not seeing another unit of blood to be given. I called K schorr on call as we both could see the order saying transfuse 3 unit but not the blood bank. She put in another order for 1 unit pf PRBC to see if that unit would show up. Went back to blood bank and could not get the third unit because there was an emergency in the ED that was priority. Will notify when 3rd unit is ready.

## 2015-10-04 NOTE — Progress Notes (Addendum)
Patient asking to leave AMA, patient education given,patient verbalised understanding AMA form signed, discharge instructions reviewed with patient, they verbalised understanding, paper prescriptions given to patient, tele dc ccmd notified, patient left the unit via ambulation by self  saying'' i can go by myself"

## 2015-10-04 NOTE — Progress Notes (Signed)
Inpatient Diabetes Program Recommendations  AACE/ADA: New Consensus Statement on Inpatient Glycemic Control (2015)  Target Ranges:  Prepandial:   less than 140 mg/dL      Peak postprandial:   less than 180 mg/dL (1-2 hours)      Critically ill patients:  140 - 180 mg/dL   Results for Courtney Davies, Courtney Davies (MRN [id number]) as of 10/04/2015 09:37  Ref. Range 10/03/2015 14:55 10/04/2015 05:00  Glucose Latest Ref Range: 65-99 mg/dL 151 (H) 252 (H)    Review of Glycemic Control  Diabetes history: No DM hx Inpatient Diabetes Program Recommendations:  Noted lab glucose results and on steroids. Please consider CBGs ac & hs with Novolog correction while on steroids. May also consider A1c to determine prehospital glycemic control.  Thank you, Nani Gasser. Gaven Eugene, RN, MSN, CDE Inpatient Glycemic Control Team Team Pager [phone] (8am-5pm) 10/04/2015 9:42 AM

## 2015-10-04 NOTE — Discharge Instructions (Signed)
Follow with Primary MD  in 7 days   Get CBC, CMP, B12, A1c, LDH, Haptoglobin, TSH, checked  by Primary MD next visit.    Activity: As tolerated with Full fall precautions use walker/cane & assistance as needed   Disposition Home     Diet:   Heart Healthy -Low Carb.  For Heart failure patients - Check your Weight same time everyday, if you gain over 2 pounds, or you develop in leg swelling, experience more shortness of breath or chest pain, call your Primary MD immediately. Follow Cardiac Low Salt Diet and 1.5 lit/day fluid restriction.   On your next visit with your primary care physician please Get Medicines reviewed and adjusted.   Please request your Prim.MD to go over all Hospital Tests and Procedure/Radiological results at the follow up, please get all Hospital records sent to your Prim MD by signing hospital release before you go home.   If you experience worsening of your admission symptoms, develop shortness of breath, life threatening emergency, suicidal or homicidal thoughts you must seek medical attention immediately by calling 911 or calling your MD immediately  if symptoms less severe.  You Must read complete instructions/literature along with all the possible adverse reactions/side effects for all the Medicines you take and that have been prescribed to you. Take any new Medicines after you have completely understood and accpet all the possible adverse reactions/side effects.   Do not drive, operate heavy machinery, perform activities at heights, swimming or participation in water activities or provide baby sitting services if your were admitted for syncope or siezures until you have seen by Primary MD or a Neurologist and advised to do so again.  Do not drive when taking Pain medications.    Do not take more than prescribed Pain, Sleep and Anxiety Medications  Special Instructions: If you have smoked or chewed Tobacco  in the last 2 yrs please stop smoking, stop any  regular Alcohol  and or any Recreational drug use.  Wear Seat belts while driving.   Please note  You were cared for by a hospitalist during your hospital stay. If you have any questions about your discharge medications or the care you received while you were in the hospital after you are discharged, you can call the unit and asked to speak with the hospitalist on call if the hospitalist that took care of you is not available. Once you are discharged, your primary care physician will handle any further medical issues. Please note that NO REFILLS for any discharge medications will be authorized once you are discharged, as it is imperative that you return to your primary care physician (or establish a relationship with a primary care physician if you do not have one) for your aftercare needs so that they can reassess your need for medications and monitor your lab values.

## 2015-10-04 NOTE — Discharge Summary (Signed)
Courtney Davies, is a 40 y.o. female  DOB 28-Aug-1975  MRN [id number].  Admission date:  10/03/2015  Admitting Physician  Thurnell Lose, MD  Discharge Date:  10/04/2015   Primary MD  No PCP Per Patient  Recommendations for primary care physician for things to follow:   Check CBC, CMP, A1c, LDH, haptoglobin within a week. Check B-12 levels in 2 months and then every 6 months.  Needs close outpatient GI and hematology follow-up.   Admission Diagnosis  Elevated liver enzymes [R74.8]   Discharge Diagnosis  Elevated liver enzymes [R74.8]     Active Problems:   Symptomatic anemia   Bilateral edema of lower extremity   Low back pain with left-sided sciatica   Chronic back pain      Past Medical History  Diagnosis Date  . Asthma     as a child  . Anemia 09/2015  . Chronic back pain     Past Surgical History  Procedure Laterality Date  . Cesarean section      X 2  . Hernia repair    . Abdominal hysterectomy         HPI  from the history and physical done on the day of admission:   Courtney Davies is a 40 y.o. female, With H/O severe B12 deficiency anemia needing transfusions several times, nicotine and marijuana abuse, chronic back pain out of narcotics for months, mildly elevated Liver enzymes, who went to [facility] clinic with complaints of fatigue and SOB, was found to have a HB of 5, sent to the ER.  In the ER HB 4.9, patient denied any bleeding or melena, no heavy menses, says she does not take B12 shots or any regular meds says her NP would not prescribe, no other symptoms except above, during my interview patient remained on cell phone and talking to different friends, in no distress, refused rectal exam.      Hospital Course:     1. Symptomatic Anemia with H/O B12 deficiency who is non complaint  with her shots - denies any blood in stool or Melena, no heavy menses, refused rectal exam to EDP & me - anemia panel confirms severe B-12 deficiency, she is noncompliant with her B-12 shots, received 2 units of packed RBC transfusion refused third unit, placed on B-12 shots prescriptions provided as she says she has no B-12 shots of syringes left, she is also fired her previous PCP and is looking for a new one. [facility]  Request PCP to check CBC, CMP, A1c, LDH, haptoglobin, peripheral smear, B-12 levels and monitor appropriately, she should follow with hematology outpatient as well.   2. Exertional SOB not hypoxic - due to #1 above + mild reactive asthma. She received IV steroids and nebulizer treatments. Also transfusion as above, this morning she is completely symptom free and no wheezing, we will give her handheld albuterol and short dose of Medrol Dosepak. She could have undiagnosed asthma.  3.Chr back pain -  out of narcotics for months, has seen Dr Kathyrn Sheriff, supportive Rx, told no IV Narcotics will be given. No narcotic prescriptions provided here.  4. Smoking Nicotene and Marijuana - counseled to stop, he says she will not stop marijuana or smoking.  5. Mild Thrombocytopenia and elevated AST (chronic) - pending LDH, heptoglobin, peripheral smear, Ac.Hep panel, right upper quadrant ultrasound shows fatty liver, requested to follow with GI outpatient. Also requested to follow with hematology outpatient. Note thrombocytopenia likely is related to B-12 levels, extremely low B-12 levels can also cause some hemolysis.  Note patient today refused her third unit of packed RBC transfusion, she refuses any further care and wants to be discharged immediately or she will sign out AMA.   Follow UP  Follow-up Information    Follow up with [facility]. Schedule an appointment as soon as possible for a visit in 1 week.    Contact information:   [address] [place] [address] [phone]      Follow up with Silvano Rusk, MD. Schedule an appointment as soon as possible for a visit in 1 week.   Specialty:  Gastroenterology   Why:  Fatty Liver   Contact information:   [address] [place] [address] [phone]       Follow up with Rulon Eisenmenger, MD. Schedule an appointment as soon as possible for a visit in 1 week.   Specialty:  Hematology and Oncology   Why:  Severe B-12 deficiency anemia   Contact information:   [place] [address] [phone]        Consults obtained - None  Discharge Condition: Fair  Diet and Activity recommendation: See Discharge Instructions below  Discharge Instructions       Discharge Instructions    Discharge instructions    Complete by:  As directed   Follow with Primary MD  in 7 days   Get CBC, CMP, B12, A1c, LDH, Haptoglobin, TSH, checked  by Primary MD next visit.    Activity: As tolerated with Full fall precautions use walker/cane & assistance as needed   Disposition Home     Diet:   Heart Healthy -Low Carb.  For Heart failure patients - Check your Weight same time everyday, if you gain over 2 pounds, or you develop in leg swelling, experience more shortness of breath or chest pain, call your Primary MD immediately. Follow Cardiac Low Salt Diet and 1.5 lit/day fluid restriction.   On your next visit with your primary care physician please Get Medicines reviewed and adjusted.   Please request your Prim.MD to go over all Hospital Tests and Procedure/Radiological results at the follow up, please get all Hospital records sent to your Prim MD by signing hospital release before you go home.   If you experience worsening of your admission symptoms, develop shortness of breath, life threatening emergency, suicidal or homicidal thoughts you must seek medical attention immediately by calling 911 or  calling your MD immediately  if symptoms less severe.  You Must read complete instructions/literature along with all the possible adverse reactions/side effects for all the Medicines you take and that have been prescribed to you. Take any new Medicines after you have completely understood and accpet all the possible adverse reactions/side effects.   Do not drive, operate heavy machinery, perform activities at heights, swimming or participation in water activities or provide baby sitting services if your were admitted for syncope or siezures until  you have seen by Primary MD or a Neurologist and advised to do so again.  Do not drive when taking Pain medications.    Do not take more than prescribed Pain, Sleep and Anxiety Medications  Special Instructions: If you have smoked or chewed Tobacco  in the last 2 yrs please stop smoking, stop any regular Alcohol  and or any Recreational drug use.  Wear Seat belts while driving.   Please note  You were cared for by a hospitalist during your hospital stay. If you have any questions about your discharge medications or the care you received while you were in the hospital after you are discharged, you can call the unit and asked to speak with the hospitalist on call if the hospitalist that took care of you is not available. Once you are discharged, your primary care physician will handle any further medical issues. Please note that NO REFILLS for any discharge medications will be authorized once you are discharged, as it is imperative that you return to your primary care physician (or establish a relationship with a primary care physician if you do not have one) for your aftercare needs so that they can reassess your need for medications and monitor your lab values.     Increase activity slowly    Complete by:  As directed              Discharge Medications       Medication List    STOP taking these medications        levofloxacin 500 MG tablet   Commonly known as:  LEVAQUIN      TAKE these medications        albuterol 108 (90 Base) MCG/ACT inhaler  Commonly known as:  PROVENTIL HFA;VENTOLIN HFA  Inhale 2 puffs into the lungs every 6 (six) hours as needed for wheezing or shortness of breath.     cyanocobalamin 1000 MCG/ML injection  Commonly known as:  (VITAMIN B-12)  Inject 88m SQ daily for 7 days then every 2 weeks.     cyclobenzaprine 5 MG tablet  Commonly known as:  FLEXERIL  Take 1 tablet (5 mg total) by mouth 3 (three) times daily as needed for muscle spasms (leg pain, back pain).     folic acid 1 MG tablet  Commonly known as:  FOLVITE  Take 1 tablet (1 mg total) by mouth daily.     Insulin Syringe-Needle U-100 25G X 1" 1 ML Misc  Commonly known as:  B-D INSULIN SYRINGE 1CC/25GX1"  Use for subcutaneous B-12 shots. Provide 15 syringes.     methylPREDNISolone 4 MG Tbpk tablet  Commonly known as:  MEDROL DOSEPAK  follow package directions     morphine 15 MG 12 hr tablet  Commonly known as:  MS CONTIN  Take 1 tablet (15 mg total) by mouth every 12 (twelve) hours.     oxyCODONE-acetaminophen 5-325 MG tablet  Commonly known as:  ROXICET  Take 1 tablet by mouth every 4 (four) hours as needed for severe pain.     pantoprazole 40 MG tablet  Commonly known as:  PROTONIX  Take 1 tablet (40 mg total) by mouth daily.     polyethylene glycol packet  Commonly known as:  MIRALAX / GLYCOLAX  Take 17 g by mouth 2 (two) times daily.     senna-docusate 8.6-50 MG tablet  Commonly known as:  Senokot-S  Take 1 tablet by mouth 2 (two) times daily.     traMADol  50 MG tablet  Commonly known as:  ULTRAM  Take 1 tablet (50 mg total) by mouth every 8 (eight) hours as needed for moderate pain.        Major procedures and Radiology Reports - PLEASE review detailed and final reports for all details, in brief -       Dg Chest 2 View  10/03/2015  CLINICAL DATA:  Shortness of breath, weakness. EXAM: CHEST  2 VIEW  COMPARISON:  Earlier today at outside institution FINDINGS: Heart is upper limits normal in size. Lungs are clear. No effusions. No acute bony abnormality. IMPRESSION: No active cardiopulmonary disease. Electronically Signed   By: Rolm Baptise M.D.   On: 10/03/2015 15:29   US Abdomen Limited Ruq  10/04/2015  CLINICAL DATA:  Elevated LFTs. EXAM: US ABDOMEN LIMITED - RIGHT UPPER QUADRANT COMPARISON:  Ultrasound of the abdomen 10/30/2009 FINDINGS: Gallbladder: The gallbladder is partially collapsed, and therefore suboptimally evaluated. No gross abnormality such as cholelithiasis or gallbladder wall thickening identified. Common bile duct: Diameter: 3.7 mm Liver: The liver echogenicity is diffusely heterogeneous, without focal mass identified. IMPRESSION: Partially collapsed, otherwise unremarkable gallbladder. Diffusely coarse echogenicity of the liver, usually associated with hepatic steatosis or fibrosis. Electronically Signed   By: Fidela Salisbury M.D.   On: 10/04/2015 08:15    Micro Results      No results found for this or any previous visit (from the past 240 hour(s)).     Today   Subjective    Elisabeth Strom today has no headache,no chest abdominal pain,no new weakness tingling or numbness, feels much better wants to go home today.     Objective   Blood pressure 122/70, pulse 67, temperature 98.5 F (36.9 C), temperature source Oral, resp. rate 20, height 5' 5"  (1.651 m), weight 91.899 kg (202 lb 9.6 oz), last menstrual period 12/03/2012, SpO2 100 %.   Intake/Output Summary (Last 24 hours) at 10/04/15 0958 Last data filed at 10/04/15 0700  Gross per 24 hour  Intake   1373 ml  Output      0 ml  Net   1373 ml    Exam Awake Alert, Oriented x 3, No new F.N deficits, Normal affect [place].AT,PERRAL Supple Neck,No JVD, No cervical lymphadenopathy appriciated.  Symmetrical Chest wall movement, Good air movement bilaterally, CTAB RRR,No Gallops,Rubs or new Murmurs, No Parasternal  Heave +ve B.Sounds, Abd Soft, Non tender, No organomegaly appriciated, No rebound -guarding or rigidity. No Cyanosis, Clubbing or edema, No new Rash or bruise   Data Review   CBC w Diff: Lab Results  Component Value Date   WBC 5.9 10/04/2015   HGB 6.9* 10/04/2015   HCT 21.5* 10/04/2015   PLT 91* 10/04/2015   LYMPHOPCT 15 10/03/2015   MONOPCT 1 10/03/2015   EOSPCT 0 10/03/2015   BASOPCT 0 10/03/2015    CMP: Lab Results  Component Value Date   NA 135 10/04/2015   K 4.8 10/04/2015   CL 108 10/04/2015   CO2 19* 10/04/2015   BUN 9 10/04/2015   CREATININE 0.82 10/04/2015   PROT 6.5 10/03/2015   ALBUMIN 4.0 10/03/2015   BILITOT 0.6 10/03/2015   ALKPHOS 52 10/03/2015   AST 112* 10/03/2015   ALT 55* 10/03/2015  . Results for HELI, DINO (MRN [id number]) as of 10/04/2015 09:58  Ref. Range 10/04/2015 05:00  Vitamin B12 Latest Ref Range: 180-914 pg/mL 52 (L)   Results for MADDY, GRAHAM (MRN [id number]) as of 10/04/2015 09:58  Ref. Range 10/04/2015 05:00  Iron Latest Ref Range: 28-170 ug/dL 229 (H)  UIBC Latest Units: ug/dL 36  TIBC Latest Ref Range: 250-450 ug/dL 265  Saturation Ratios Latest Ref Range: 10.4-31.8 % 87 (H)  Ferritin Latest Ref Range: 11-307 ng/mL 1072 (H)   Results for ESMA, KILTS (MRN [id number]) as of 10/04/2015 09:58  Ref. Range 10/04/2015 05:00  Iron Latest Ref Range: 28-170 ug/dL 229 (H)  UIBC Latest Units: ug/dL 36  TIBC Latest Ref Range: 250-450 ug/dL 265  Saturation Ratios Latest Ref Range: 10.4-31.8 % 87 (H)  Ferritin Latest Ref Range: 11-307 ng/mL 1072 (H)   Results for KARIANA, WILES (MRN [id number]) as of 10/04/2015 09:58  Ref. Range 10/04/2015 05:00  Iron Latest Ref Range: 28-170 ug/dL 229 (H)  UIBC Latest Units: ug/dL 36  TIBC Latest Ref Range: 250-450 ug/dL 265  Saturation Ratios Latest Ref Range: 10.4-31.8 % 87 (H)  Ferritin Latest Ref Range: 11-307 ng/mL 1072 (H)    Total Time in preparing paper work, data evaluation and todays  exam - 35 minutes  Lala Lund K M.D on 10/04/2015 at 9:58 AM  [facility]   Office  [phone]

## 2015-10-05 ENCOUNTER — Encounter: Payer: Self-pay | Admitting: Gastroenterology

## 2015-10-05 LAB — TYPE AND SCREEN
ABO/RH(D): O POS
ANTIBODY SCREEN: NEGATIVE
DONOR AG TYPE: NEGATIVE
Donor AG Type: NEGATIVE
Donor AG Type: NEGATIVE
Unit division: 0
Unit division: 0
Unit division: 0

## 2015-10-26 ENCOUNTER — Other Ambulatory Visit (INDEPENDENT_AMBULATORY_CARE_PROVIDER_SITE_OTHER): Payer: Medicaid Other

## 2015-10-26 ENCOUNTER — Encounter: Payer: Self-pay | Admitting: Gastroenterology

## 2015-10-26 ENCOUNTER — Ambulatory Visit (INDEPENDENT_AMBULATORY_CARE_PROVIDER_SITE_OTHER): Payer: Medicaid Other | Admitting: Gastroenterology

## 2015-10-26 VITALS — BP 100/64 | HR 76 | Ht 66.0 in | Wt 202.4 lb

## 2015-10-26 DIAGNOSIS — E538 Deficiency of other specified B group vitamins: Secondary | ICD-10-CM

## 2015-10-26 DIAGNOSIS — K5900 Constipation, unspecified: Secondary | ICD-10-CM

## 2015-10-26 DIAGNOSIS — G43A1 Cyclical vomiting, intractable: Secondary | ICD-10-CM

## 2015-10-26 DIAGNOSIS — R1031 Right lower quadrant pain: Secondary | ICD-10-CM | POA: Diagnosis not present

## 2015-10-26 DIAGNOSIS — R1115 Cyclical vomiting syndrome unrelated to migraine: Secondary | ICD-10-CM

## 2015-10-26 DIAGNOSIS — F1210 Cannabis abuse, uncomplicated: Secondary | ICD-10-CM

## 2015-10-26 DIAGNOSIS — D649 Anemia, unspecified: Secondary | ICD-10-CM | POA: Diagnosis not present

## 2015-10-26 LAB — COMPREHENSIVE METABOLIC PANEL
ALK PHOS: 78 U/L (ref 39–117)
ALT: 11 U/L (ref 0–35)
AST: 16 U/L (ref 0–37)
Albumin: 4.3 g/dL (ref 3.5–5.2)
BUN: 13 mg/dL (ref 6–23)
CHLORIDE: 108 meq/L (ref 96–112)
CO2: 22 meq/L (ref 19–32)
Calcium: 9.1 mg/dL (ref 8.4–10.5)
Creatinine, Ser: 0.64 mg/dL (ref 0.40–1.20)
GFR: 132.63 mL/min (ref >60.00)
GLUCOSE: 99 mg/dL (ref 70–99)
POTASSIUM: 3.7 meq/L (ref 3.5–5.1)
SODIUM: 137 meq/L (ref 135–145)
Total Bilirubin: 0.5 mg/dL (ref 0.2–1.2)
Total Protein: 7.5 g/dL (ref 6.0–8.3)

## 2015-10-26 LAB — IGA: IGA: 196 mg/dL (ref 68–378)

## 2015-10-26 LAB — CBC WITH DIFFERENTIAL/PLATELET
BASOS PCT: 0.4 % (ref 0.0–3.0)
Basophils Absolute: 0 10*3/uL (ref 0.0–0.1)
EOS PCT: 2.8 % (ref 0.0–5.0)
Eosinophils Absolute: 0.2 10*3/uL (ref 0.0–0.7)
HCT: 33.4 % — ABNORMAL LOW (ref 36.0–46.0)
HEMOGLOBIN: 10.1 g/dL — AB (ref 12.0–15.0)
Lymphocytes Relative: 32.2 % (ref 12.0–46.0)
Lymphs Abs: 2.5 10*3/uL (ref 0.7–4.0)
MCHC: 30.3 g/dL (ref 30.0–36.0)
MCV: 84.5 fl (ref 78.0–100.0)
MONO ABS: 0.3 10*3/uL (ref 0.1–1.0)
Monocytes Relative: 3.7 % (ref 3.0–12.0)
Neutro Abs: 4.8 10*3/uL (ref 1.4–7.7)
Neutrophils Relative %: 60.9 % (ref 43.0–77.0)
Platelets: 335 10*3/uL (ref 150.0–400.0)
RBC: 3.95 Mil/uL (ref 3.87–5.11)
RDW: 16.7 % — AB (ref 11.5–15.5)
WBC: 7.9 10*3/uL (ref 4.0–10.5)

## 2015-10-26 LAB — TSH: TSH: 1.13 u[IU]/mL (ref 0.35–4.50)

## 2015-10-26 LAB — C-REACTIVE PROTEIN: CRP: 0.4 mg/dL — AB (ref 0.5–20.0)

## 2015-10-26 LAB — PROTIME-INR
INR: 1.3 ratio — AB (ref 0.8–1.0)
Prothrombin Time: 13.3 s — ABNORMAL HIGH (ref 9.6–13.1)

## 2015-10-26 LAB — VITAMIN B12: VITAMIN B 12: 126 pg/mL — AB (ref 211–911)

## 2015-10-26 LAB — SEDIMENTATION RATE: Sed Rate: 34 mm/hr — ABNORMAL HIGH (ref 0–20)

## 2015-10-26 LAB — FOLATE: FOLATE: 3.8 ng/mL — AB (ref >5.9)

## 2015-10-26 MED ORDER — CYANOCOBALAMIN 1000 MCG/ML IJ SOLN: INTRAMUSCULAR | Status: DC

## 2015-10-26 MED ORDER — NA SULFATE-K SULFATE-MG SULF 17.5-3.13-1.6 GM/177ML PO SOLN: 1.0000 | Freq: Once | ORAL | Status: DC

## 2015-10-26 MED ORDER — FOLIC ACID 1 MG PO TABS: 1.0000 mg | ORAL_TABLET | Freq: Every day | ORAL | Status: DC

## 2015-10-26 NOTE — Patient Instructions (Signed)
We have sent your prescriptions to your pharmacy  You have been scheduled for a colonoscopy. Please follow written instructions given to you at your visit today.  Please pick up your prep supplies at the pharmacy within the next 1-3 days. If you use inhalers (even only as needed), please bring them with you on the day of your procedure. Your physician has requested that you go to www.startemmi.com and enter the access code given to you at your visit today. This web site gives a general overview about your procedure. However, you should still follow specific instructions given to you by our office regarding your preparation for the procedure.  You have been scheduled for an endoscopy. Please follow written instructions given to you at your visit today. If you use inhalers (even only as needed), please bring them with you on the day of your procedure. Your physician has requested that you go to www.startemmi.com and enter the access code given to you at your visit today. This web site gives a general overview about your procedure. However, you should still follow specific instructions given to you by our office regarding your preparation for the procedure.  If you are age 91 or older, your body mass index should be between 23-30. Your Body mass index is 32.68 kg/(m^2). If this is out of the aforementioned range listed, please consider follow up with your Primary Care Provider.  If you are age 77 or younger, your body mass index should be between 19-25. Your Body mass index is 32.68 kg/(m^2). If this is out of the aformentioned range listed, please consider follow up with your Primary Care Provider.    Go to the basement for labs today

## 2015-10-27 LAB — TISSUE TRANSGLUTAMINASE, IGA: TISSUE TRANSGLUTAMINASE AB, IGA: 1 U/mL (ref <4)

## 2015-10-28 ENCOUNTER — Telehealth: Payer: Self-pay | Admitting: Gastroenterology

## 2015-10-28 MED ORDER — LINACLOTIDE 145 MCG PO CAPS: 145.0000 ug | ORAL_CAPSULE | Freq: Every day | ORAL | Status: DC

## 2015-10-28 NOTE — Progress Notes (Signed)
Courtney Davies    [id number]    01/16/1976  Primary Care Physician:PROVIDER NOT IN SYSTEM  Referring Physician: No referring provider defined for this encounter.  Chief complaint: vomiting, fatty liver, B12 deficiency  HPI: 40 year old female African-American here for evaluation for severe vitamin B12 deficiency. She was recently admitted last month with symptomatic anemia, Hgb 4.9,B12 level was 52, folate 3.8, ferritin >1000 . She was noted to be B12 and folate deficient during her pregnancy 2 years ago and was recommended to take supplements for folate and B12 injections but he has not been compliant with it. She also has elevation in AST to 112 and ALT 55, normal bilirubin and alkaline phosphatase. She had abdominal ultrasound which showed diffusely coarse echogenicity of the liver that is usually associated with hepatic steatosis. Patient complains of intermittent rectal right lower quadrant abdominal pain, nausea, and vomiting. Denies any dysphagia, odynophagia, heartburn or regurgitation. She has been compliant with with taking folate gas it since discharge from the hospital. She continues to smoke marijuana on regular basis and is trying to cut back on the amount. Denies any other recreational drug abuse or excessive alcohol use. She complains of loss of appetite and weight loss for the past few months. Review of system also positive for worsening constipation with irregular bowel movements and she is currently not on any laxatives   Outpatient Encounter Prescriptions as of 10/26/2015  Medication Sig  . albuterol (PROVENTIL HFA;VENTOLIN HFA) 108 (90 Base) MCG/ACT inhaler Inhale 2 puffs into the lungs every 6 (six) hours as needed for wheezing or shortness of breath.  . cyanocobalamin (,VITAMIN B-12,) 1000 MCG/ML injection Inject 62m SQ daily for 7 days then every 2 weeks.  . cyclobenzaprine (FLEXERIL) 5 MG tablet Take 1 tablet (5 mg total) by mouth 3 (three) times daily as  needed for muscle spasms (leg pain, back pain).  . folic acid (FOLVITE) 1 MG tablet Take 1 tablet (1 mg total) by mouth daily.  . Insulin Syringe-Needle U-100 (B-D INSULIN SYRINGE 1CC/25GX1") 25G X 1" 1 ML MISC Use for subcutaneous B-12 shots. Provide 15 syringes.  . pantoprazole (PROTONIX) 40 MG tablet Take 1 tablet (40 mg total) by mouth daily.  . polyethylene glycol (MIRALAX / GLYCOLAX) packet Take 17 g by mouth 2 (two) times daily.  .Marland Kitchensenna-docusate (SENOKOT-S) 8.6-50 MG per tablet Take 1 tablet by mouth 2 (two) times daily.  . traMADol (ULTRAM) 50 MG tablet Take 1 tablet (50 mg total) by mouth every 8 (eight) hours as needed for moderate pain.  . [DISCONTINUED] cyanocobalamin (,VITAMIN B-12,) 1000 MCG/ML injection Inject 195mSQ daily for 7 days then every 2 weeks.  . [DISCONTINUED] folic acid (FOLVITE) 1 MG tablet Take 1 tablet (1 mg total) by mouth daily.  . Na Sulfate-K Sulfate-Mg Sulf (SUPREP BOWEL PREP KIT) 17.5-3.13-1.6 GM/180ML SOLN Take 1 kit by mouth once.  . [DISCONTINUED] methylPREDNISolone (MEDROL DOSEPAK) 4 MG TBPK tablet follow package directions  . [DISCONTINUED] morphine (MS CONTIN) 15 MG 12 hr tablet Take 1 tablet (15 mg total) by mouth every 12 (twelve) hours.  . [DISCONTINUED] oxyCODONE-acetaminophen (ROXICET) 5-325 MG per tablet Take 1 tablet by mouth every 4 (four) hours as needed for severe pain.   No facility-administered encounter medications on file as of 10/26/2015.    Allergies as of 10/26/2015 - Review Complete 10/26/2015  Allergen Reaction Noted  . Codeine Nausea And Vomiting 12/14/2012  . Other Other (See Comments) 04/11/2014  Past Medical History  Diagnosis Date  . Asthma     as a child  . Anemia 09/2015  . Chronic back pain   . Arthritis   . Fatty liver     Past Surgical History  Procedure Laterality Date  . Cesarean section      X 2  . Hernia repair      x 4  . Abdominal hysterectomy      Family History  Problem Relation Age of Onset  .  Anemia Paternal Grandmother   . Valvular heart disease Paternal Grandmother   . Diabetes Mother   . Diabetes Maternal Grandmother   . Hypertension Mother   . Prostate cancer Maternal Uncle     ? intestinal also    Social History   Social History  . Marital Status: Single    Spouse Name: N/A  . Number of Children: 2  . Years of Education: N/A   Occupational History  . Not on file.   Social History Main Topics  . Smoking status: Current Every Day Smoker -- 0.50 packs/day for 13 years    Types: Cigarettes  . Smokeless tobacco: Never Used  . Alcohol Use: 0.0 oz/week    0 Standard drinks or equivalent per week     Comment: ocassional  . Drug Use: Yes     Comment: Marijuana, daily  . Sexual Activity: Yes    Birth Control/ Protection: None   Other Topics Concern  . Not on file   Social History Narrative      Review of systems: Review of Systems  Constitutional: Negative for fever and chills.  positive for fatigue, weight loss and loss of appetite HENT: Negative.   Eyes: Negative for blurred vision.  Respiratory: Negative for cough, shortness of breath and wheezing.   Cardiovascular: Negative for chest pain and palpitations.  Gastrointestinal: as per HPI Genitourinary: Negative for dysuria, urgency, and hematuria.  positive for increased urinary frequency Musculoskeletal: Positive for myalgias, back pain and joint pain.  Skin: Negative for itching and rash.  Neurological: Negative for dizziness, tremors, focal weakness, seizures and loss of consciousness.  Endo/Heme/Allergies: Negative for environmental allergies.  Psychiatric/Behavioral: Positive for depression, negative for suicidal ideas and hallucinations.  All other systems reviewed and are negative.   Physical Exam: Filed Vitals:   10/26/15 0850  BP: 100/64  Pulse: 76   Gen:      No acute distress HEENT:  EOMI, sclera anicteric Neck:     No masses; no thyromegaly Lungs:    Clear to auscultation  bilaterally; normal respiratory effort CV:         Regular rate and rhythm; no murmurs Abd:      + bowel sounds; soft, non-tender; no palpable masses, no distension Ext:    No edema; adequate peripheral perfusion Skin:      Warm and dry; no rash Neuro: alert and oriented x 3 Psych: normal mood and affect  Data Reviewed:Reviewed labs and pertinent diagnostic work up    Assessment and Plan/Recommendations: 40 year old female with severe symptomatic anemia in the setting of severe B12 and folate deficiency with complaints of nausea, vomiting associated with anorexia and weight loss and also worsening constipation and right lower quadrant abdominal pain Will schedule for EGD and colonoscopy for evaluation, to exclude possible IBD, celiac disease, atrophic gastritis The risks and benefits as well as alternatives of endoscopic procedure(s) have been discussed and reviewed. All questions answered. The patient agrees to proceed. We'll check CBC, B12 and  folate level  Elevated AST and ALT: We'll recheck LFTs. Patient did have findings of possible steatohepatitis and abdominal ultrasound. discussed diet and exercise with goal BMI less than 30  Constipation: Increase dietary fluid and fiber intake, start Linzess 145 g daily and titrate dose based on response  Intermittent nausea and vomiting could be related to chronic marijuana use and possible cyclic vomiting syndrome associated with cannabis . Will evaluate with EGD for any other possible underlying condition to exacerbate the symptoms other than cyclic vomiting syndrome.  Advised patient to quit marijuana but patient was very reluctant   Return as needed  >30 minutes was spent face-to-face with the patient. Greater than 50% of the time used for counseling as well as treatment plan and follow-up. Patient had multiple questions which were answered to her satisfaction   K. Denzil Magnuson , MD [phone] Mon-Fri 8a-5p [phone] after 5p, weekends,  holidays  CC: No ref. provider found

## 2015-10-28 NOTE — Telephone Encounter (Signed)
Called pt to inform Linzess was sent

## 2015-12-02 ENCOUNTER — Telehealth: Payer: Self-pay | Admitting: *Deleted

## 2015-12-02 NOTE — Telephone Encounter (Signed)
Pt's procedure time moved up.  New instructions printed and pt will pick up.

## 2015-12-05 ENCOUNTER — Ambulatory Visit (AMBULATORY_SURGERY_CENTER): Payer: Medicaid Other | Admitting: Gastroenterology

## 2015-12-05 ENCOUNTER — Encounter: Payer: Medicaid Other | Admitting: Gastroenterology

## 2015-12-05 ENCOUNTER — Encounter: Payer: Self-pay | Admitting: Gastroenterology

## 2015-12-05 VITALS — BP 119/80 | HR 90 | Temp 97.5°F | Resp 15 | Ht 66.0 in | Wt 202.0 lb

## 2015-12-05 DIAGNOSIS — K317 Polyp of stomach and duodenum: Secondary | ICD-10-CM

## 2015-12-05 DIAGNOSIS — R1031 Right lower quadrant pain: Secondary | ICD-10-CM

## 2015-12-05 DIAGNOSIS — D124 Benign neoplasm of descending colon: Secondary | ICD-10-CM

## 2015-12-05 DIAGNOSIS — G8929 Other chronic pain: Secondary | ICD-10-CM

## 2015-12-05 DIAGNOSIS — D509 Iron deficiency anemia, unspecified: Secondary | ICD-10-CM

## 2015-12-05 DIAGNOSIS — K319 Disease of stomach and duodenum, unspecified: Secondary | ICD-10-CM | POA: Diagnosis not present

## 2015-12-05 MED ORDER — SODIUM CHLORIDE 0.9 % IV SOLN: 500.0000 mL | INTRAVENOUS | Status: DC

## 2015-12-05 NOTE — Op Note (Signed)
[place] Patient Name: Courtney Davies Procedure Date: 12/05/2015 9:28 AM MRN: [id number] Endoscopist: Mauri Pole , MD Age: 40 Referring MD:  Date of Birth: 08-Jun-1975 Gender: Female Account #: [id number] Procedure:                Upper GI endoscopy Indications:              Suspected upper gastrointestinal bleeding in                            patient with unexplained iron deficiency anemia,                            Heartburn Medicines:                Monitored Anesthesia Care Procedure:                Pre-Anesthesia Assessment:                           - Prior to the procedure, a History and Physical                            was performed, and patient medications and                            allergies were reviewed. The patient's tolerance of                            previous anesthesia was also reviewed. The risks                            and benefits of the procedure and the sedation                            options and risks were discussed with the patient.                            All questions were answered, and informed consent                            was obtained. Prior Anticoagulants: The patient has                            taken no previous anticoagulant or antiplatelet                            agents. ASA Grade Assessment: III - A patient with                            severe systemic disease. After reviewing the risks                            and benefits, the patient was deemed in  satisfactory condition to undergo the procedure.                           After obtaining informed consent, the endoscope was                            passed under direct vision. Throughout the                            procedure, the patient's blood pressure, pulse, and                            oxygen saturations were monitored continuously. The                            Model GIF-HQ190 [id number]) scope was  introduced                            through the mouth, and advanced to the second part                            of duodenum. The upper GI endoscopy was                            accomplished without difficulty. The patient                            tolerated the procedure well. Scope In: Scope Out: Findings:                 The esophagus was normal.                           The Z-line was regular and was found 35 cm from the                            incisors.                           Diffuse moderately erythematous mucosa without                            bleeding was found in the entire examined stomach.                           A single 18 mm semi-sessile polyp with no bleeding                            and stigmata of recent bleeding with mucosal                            ulceration was found on the greater curvature of                            the stomach. The polyp was removed with  a cold                            snare, it detached prior to using cautery.                            Resection and retrieval were complete. To control                            bleeding after the polypectomy, two hemostatic                            clips were successfully placed (MR conditional).                            Bleeding had stopped at the end of the procedure.                           The examined duodenum was normal.                           Biopsies were taken with a cold forceps in the                            entire examined stomach, in the duodenal bulb and                            in the second portion of the duodenum for histology. Complications:            No immediate complications. Estimated Blood Loss:     Estimated blood loss was minimal. Impression:               - Normal esophagus.                           - Z-line regular, 35 cm from the incisors.                           - Erythematous mucosa in the stomach.                           - A single  gastric polyp. Resected and retrieved.                            Clips (MR conditional) were placed. Biopsied.                           - Normal examined duodenum.                           - Biopsies were taken with a cold forceps for                            histology in the entire examined stomach, in the  duodenal bulb and in the second portion of the                            duodenum. Recommendation:           - Patient has a contact number available for                            emergencies. The signs and symptoms of potential                            delayed complications were discussed with the                            patient. Return to normal activities tomorrow.                            Written discharge instructions were provided to the                            patient.                           - Resume previous diet.                           - Continue present medications.                           - No aspirin, ibuprofen, naproxen, or other                            non-steroidal anti-inflammatory drugs for 2 weeks.                           - Repeat upper endoscopy after studies are complete                            for surveillance based on pathology results.                           - Return to GI clinic PRN. Mauri Pole, MD 12/05/2015 10:12:55 AM This report has been signed electronically.

## 2015-12-05 NOTE — Progress Notes (Signed)
Report given to PACU RN, vss

## 2015-12-05 NOTE — Op Note (Signed)
[place] Patient Name: Courtney Davies Procedure Date: 12/05/2015 9:27 AM MRN: [id number] Endoscopist: Mauri Pole , MD Age: 40 Referring MD:  Date of Birth: 10-12-1975 Gender: Female Account #: [id number] Procedure:                Colonoscopy Indications:              Unexplained iron deficiency anemia, This is the                            patient's first colonoscopy Medicines:                Monitored Anesthesia Care Procedure:                Pre-Anesthesia Assessment:                           - Prior to the procedure, a History and Physical                            was performed, and patient medications and                            allergies were reviewed. The patient's tolerance of                            previous anesthesia was also reviewed. The risks                            and benefits of the procedure and the sedation                            options and risks were discussed with the patient.                            All questions were answered, and informed consent                            was obtained. Prior Anticoagulants: The patient has                            taken no previous anticoagulant or antiplatelet                            agents. ASA Grade Assessment: III - A patient with                            severe systemic disease. After reviewing the risks                            and benefits, the patient was deemed in                            satisfactory condition to undergo the procedure.  After obtaining informed consent, the colonoscope                            was passed under direct vision. Throughout the                            procedure, the patient's blood pressure, pulse, and                            oxygen saturations were monitored continuously. The                            Model CF-HQ190L [id number]) scope was introduced                            through the anus and  advanced to the the terminal                            ileum, with identification of the appendiceal                            orifice and IC valve. The colonoscopy was performed                            without difficulty. The patient tolerated the                            procedure well. The quality of the bowel                            preparation was good. The terminal ileum, ileocecal                            valve, appendiceal orifice, and rectum were                            photographed. Scope In: 9:50:18 AM Scope Out: 10:02:02 AM Scope Withdrawal Time: 0 hours 7 minutes 57 seconds  Total Procedure Duration: 0 hours 11 minutes 44 seconds  Findings:                 The perianal and digital rectal examinations were                            normal.                           The ileum, 25 cm from the ileocecal valve appeared                            normal. Biopsies were taken with a cold forceps for                            histology.  A 5 mm polyp with erythema and superficial mucosal                            ulceration was found in the descending colon. The                            polyp was sessile. The polyp was removed with a                            cold snare. Resection and retrieval were complete.                           Non-bleeding internal hemorrhoids were found during                            retroflexion. The hemorrhoids were small.                           The exam was otherwise without abnormality. Complications:            No immediate complications. Estimated Blood Loss:     Estimated blood loss was minimal. Impression:               - The examined portion of the ileum was normal.                            Biopsied.                           - One 5 mm polyp in the descending colon, removed                            with a cold snare. Resected and retrieved.                           - Non-bleeding internal  hemorrhoids.                           - The examination was otherwise normal. Recommendation:           - Patient has a contact number available for                            emergencies. The signs and symptoms of potential                            delayed complications were discussed with the                            patient. Return to normal activities tomorrow.                            Written discharge instructions were provided to the  patient.                           - Resume previous diet.                           - Continue present medications.                           - No aspirin, ibuprofen, naproxen, or other                            non-steroidal anti-inflammatory drugs for 2 weeks.                           - Await pathology results.                           - Repeat colonoscopy at appointment to be scheduled                            for surveillance based on pathology results.                           - Return to GI clinic PRN. Mauri Pole, MD 12/05/2015 10:16:20 AM This report has been signed electronically.

## 2015-12-05 NOTE — Progress Notes (Signed)
Called to room to assist during endoscopic procedure.  Patient ID and intended procedure confirmed with present staff. Received instructions for my participation in the procedure from the performing physician.  

## 2015-12-05 NOTE — Patient Instructions (Signed)
YOU HAD AN ENDOSCOPIC PROCEDURE TODAY AT [facility]:   Refer to the procedure report that was given to you for any specific questions about what was found during the examination.  If the procedure report does not answer your questions, please call your gastroenterologist to clarify.  If you requested that your care partner not be given the details of your procedure findings, then the procedure report has been included in a sealed envelope for you to review at your convenience later.  YOU SHOULD EXPECT: Some feelings of bloating in the abdomen. Passage of more gas than usual.  Walking can help get rid of the air that was put into your GI tract during the procedure and reduce the bloating. If you had a lower endoscopy (such as a colonoscopy or flexible sigmoidoscopy) you may notice spotting of blood in your stool or on the toilet paper. If you underwent a bowel prep for your procedure, you may not have a normal bowel movement for a few days.  Please Note:  You might notice some irritation and congestion in your nose or some drainage.  This is from the oxygen used during your procedure.  There is no need for concern and it should clear up in a day or so.  SYMPTOMS TO REPORT IMMEDIATELY:   Following lower endoscopy (colonoscopy or flexible sigmoidoscopy):  Excessive amounts of blood in the stool  Significant tenderness or worsening of abdominal pains  Swelling of the abdomen that is new, acute  Fever of 100F or higher   Following upper endoscopy (EGD)  Vomiting of blood or coffee ground material  New chest pain or pain under the shoulder blades  Painful or persistently difficult swallowing  New shortness of breath  Fever of 100F or higher  Black, tarry-looking stools  For urgent or emergent issues, a gastroenterologist can be reached at any hour by calling [phone].   DIET:  We do recommend a small meal at first, but then you may proceed to your regular diet.  Drink  plenty of fluids but you should avoid alcoholic beverages for 24 hours.  ACTIVITY:  You should plan to take it easy for the rest of today and you should NOT DRIVE or use heavy machinery until tomorrow (because of the sedation medicines used during the test).    FOLLOW UP: Our staff will call the number listed on your records the next business day following your procedure to check on you and address any questions or concerns that you may have regarding the information given to you following your procedure. If we do not reach you, we will leave a message.  However, if you are feeling well and you are not experiencing any problems, there is no need to return our call.  We will assume that you have returned to your regular daily activities without incident.  If any biopsies were taken you will be contacted by phone or by letter within the next 1-3 weeks.  Please call us at [phone] if you have not heard about the biopsies in 3 weeks.    SIGNATURES/CONFIDENTIALITY: You and/or your care partner have signed paperwork which will be entered into your electronic medical record.  These signatures attest to the fact that that the information above on your After Visit Summary has been reviewed and is understood.  Full responsibility of the confidentiality of this discharge information lies with you and/or your care-partner.  No aspirin, ibuprofen, naproxen, or other non-steroidal anti-inflammatory drugs for 2  weeks. Please read polyp and hemorrhoid handouts provided. Please keep clip card with you at all times.

## 2015-12-06 ENCOUNTER — Telehealth: Payer: Self-pay

## 2015-12-06 NOTE — Telephone Encounter (Signed)
  Follow up Call-  Call back number 12/05/2015  Post procedure Call Back phone  # [phone]  Permission to leave phone message Yes  Some recent data might be hidden     Patient questions:  Do you have a fever, pain , or abdominal swelling? No. Pain Score  0 *  Have you tolerated food without any problems? Yes.    Have you been able to return to your normal activities? Yes.    Do you have any questions about your discharge instructions: Diet   No. Medications  No. Follow up visit  No.  Do you have questions or concerns about your Care? No.  Actions: * If pain score is 4 or above: No action needed, pain <4.

## 2015-12-14 ENCOUNTER — Other Ambulatory Visit: Payer: Self-pay

## 2015-12-14 DIAGNOSIS — R1084 Generalized abdominal pain: Secondary | ICD-10-CM

## 2015-12-16 ENCOUNTER — Other Ambulatory Visit: Payer: Medicaid Other

## 2015-12-16 DIAGNOSIS — R1084 Generalized abdominal pain: Secondary | ICD-10-CM

## 2015-12-18 LAB — RETICULIN ANTIBODIES, IGA W TITER: RETICULIN AB, IGA: NEGATIVE

## 2015-12-19 LAB — TISSUE TRANSGLUTAMINASE, IGA: Tissue Transglutaminase Ab, IgA: 1 U/mL (ref <4)

## 2015-12-19 LAB — GLIADIN ANTIBODIES, SERUM
GLIADIN IGA: 23 U — AB (ref <20)
GLIADIN IGG: 3 U (ref <20)

## 2016-01-10 ENCOUNTER — Telehealth: Payer: Self-pay | Admitting: Gastroenterology

## 2016-01-10 NOTE — Telephone Encounter (Signed)
Labs are in [facility]

## 2016-01-10 NOTE — Telephone Encounter (Signed)
TTG negative but Gliadin Ig A Ab positive. 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 Please advise patient to avoid Gluten. Will recheck celiac panel in 6 months. I also recommend testing for gene HLA DQ2 and HLA DQ8 for celiac disease if patient is ok with doing the testing as these two genes are strongly associated with celiac disease.

## 2016-01-11 ENCOUNTER — Other Ambulatory Visit: Payer: Self-pay

## 2016-01-11 DIAGNOSIS — R197 Diarrhea, unspecified: Secondary | ICD-10-CM

## 2016-01-11 DIAGNOSIS — R768 Other specified abnormal immunological findings in serum: Secondary | ICD-10-CM

## 2016-01-11 DIAGNOSIS — R109 Unspecified abdominal pain: Secondary | ICD-10-CM

## 2016-01-11 NOTE — Telephone Encounter (Signed)
Discussed this with the patient. She is willing to have further testing. She will began eliminating gluten from her diet. Information of Celiac disease and the diet mailed to her.

## 2016-02-13 ENCOUNTER — Other Ambulatory Visit: Payer: Self-pay | Admitting: Gastroenterology

## 2016-02-13 DIAGNOSIS — D649 Anemia, unspecified: Secondary | ICD-10-CM

## 2016-04-30 ENCOUNTER — Other Ambulatory Visit: Payer: Self-pay | Admitting: Gastroenterology

## 2016-05-24 ENCOUNTER — Other Ambulatory Visit: Payer: Self-pay | Admitting: Gastroenterology

## 2016-07-17 ENCOUNTER — Other Ambulatory Visit: Payer: Self-pay | Admitting: Gastroenterology

## 2016-08-16 ENCOUNTER — Other Ambulatory Visit: Payer: Self-pay | Admitting: Gastroenterology

## 2016-09-15 ENCOUNTER — Other Ambulatory Visit: Payer: Self-pay | Admitting: Gastroenterology

## 2017-04-22 ENCOUNTER — Encounter (HOSPITAL_COMMUNITY): Payer: Self-pay | Admitting: Emergency Medicine

## 2017-04-22 DIAGNOSIS — J209 Acute bronchitis, unspecified: Secondary | ICD-10-CM | POA: Insufficient documentation

## 2017-04-22 DIAGNOSIS — K589 Irritable bowel syndrome without diarrhea: Secondary | ICD-10-CM | POA: Insufficient documentation

## 2017-04-22 DIAGNOSIS — E538 Deficiency of other specified B group vitamins: Secondary | ICD-10-CM | POA: Insufficient documentation

## 2017-04-22 DIAGNOSIS — Z79899 Other long term (current) drug therapy: Secondary | ICD-10-CM | POA: Diagnosis not present

## 2017-04-22 DIAGNOSIS — K900 Celiac disease: Secondary | ICD-10-CM | POA: Diagnosis not present

## 2017-04-22 DIAGNOSIS — F17210 Nicotine dependence, cigarettes, uncomplicated: Secondary | ICD-10-CM | POA: Insufficient documentation

## 2017-04-22 DIAGNOSIS — D649 Anemia, unspecified: Secondary | ICD-10-CM | POA: Diagnosis present

## 2017-04-22 LAB — CBC WITH DIFFERENTIAL/PLATELET
BASOS PCT: 0 %
Basophils Absolute: 0 10*3/uL (ref 0.0–0.1)
Eosinophils Absolute: 0.1 10*3/uL (ref 0.0–0.7)
Eosinophils Relative: 2 %
HEMATOCRIT: 16.7 % — AB (ref 36.0–46.0)
HEMOGLOBIN: 5.3 g/dL — AB (ref 12.0–15.0)
LYMPHS PCT: 47 %
Lymphs Abs: 3.2 10*3/uL (ref 0.7–4.0)
MCH: 25.7 pg — AB (ref 26.0–34.0)
MCHC: 31.7 g/dL (ref 30.0–36.0)
MCV: 81.1 fL (ref 78.0–100.0)
MONOS PCT: 3 %
Monocytes Absolute: 0.2 10*3/uL (ref 0.1–1.0)
NEUTROS ABS: 3.3 10*3/uL (ref 1.7–7.7)
Neutrophils Relative %: 48 %
Platelets: 190 10*3/uL (ref 150–400)
RBC: 2.06 MIL/uL — ABNORMAL LOW (ref 3.87–5.11)
RDW: 34.7 % — AB (ref 11.5–15.5)
WBC: 6.8 10*3/uL (ref 4.0–10.5)

## 2017-04-22 LAB — COMPREHENSIVE METABOLIC PANEL
ALT: 46 U/L (ref 14–54)
AST: 120 U/L — AB (ref 15–41)
Albumin: 4.2 g/dL (ref 3.5–5.0)
Alkaline Phosphatase: 59 U/L (ref 38–126)
Anion gap: 9 (ref 5–15)
BILIRUBIN TOTAL: 0.8 mg/dL (ref 0.3–1.2)
BUN: 10 mg/dL (ref 6–20)
CO2: 22 mmol/L (ref 22–32)
CREATININE: 0.65 mg/dL (ref 0.44–1.00)
Calcium: 9.2 mg/dL (ref 8.9–10.3)
Chloride: 105 mmol/L (ref 101–111)
GFR calc Af Amer: >60 mL/min (ref >60)
Glucose, Bld: 121 mg/dL — ABNORMAL HIGH (ref 65–99)
Potassium: 3.4 mmol/L — ABNORMAL LOW (ref 3.5–5.1)
Sodium: 136 mmol/L (ref 135–145)
TOTAL PROTEIN: 6.7 g/dL (ref 6.5–8.1)

## 2017-04-22 NOTE — ED Triage Notes (Signed)
Patient received a call from her PCP office that her HGB= 5.6, denies any symptoms , no bleeding /respirations unlabored .

## 2017-04-23 ENCOUNTER — Observation Stay (HOSPITAL_COMMUNITY): Payer: Medicaid Other

## 2017-04-23 ENCOUNTER — Encounter (HOSPITAL_COMMUNITY): Payer: Self-pay | Admitting: Emergency Medicine

## 2017-04-23 ENCOUNTER — Other Ambulatory Visit: Payer: Self-pay

## 2017-04-23 ENCOUNTER — Observation Stay (HOSPITAL_COMMUNITY)
Admission: EM | Admit: 2017-04-23 | Discharge: 2017-04-24 | Disposition: A | Discharge status: Home / Self Care | Payer: Medicaid Other | Attending: Internal Medicine | Admitting: Internal Medicine

## 2017-04-23 DIAGNOSIS — J209 Acute bronchitis, unspecified: Secondary | ICD-10-CM | POA: Diagnosis not present

## 2017-04-23 DIAGNOSIS — D519 Vitamin B12 deficiency anemia, unspecified: Secondary | ICD-10-CM

## 2017-04-23 DIAGNOSIS — R0602 Shortness of breath: Secondary | ICD-10-CM

## 2017-04-23 DIAGNOSIS — D649 Anemia, unspecified: Secondary | ICD-10-CM

## 2017-04-23 HISTORY — DX: Gastro-esophageal reflux disease without esophagitis: K21.9

## 2017-04-23 HISTORY — DX: Anemia, unspecified: D64.9

## 2017-04-23 HISTORY — DX: Migraine, unspecified, not intractable, without status migrainosus: G43.909

## 2017-04-23 HISTORY — DX: Other chronic pain: G89.29

## 2017-04-23 HISTORY — DX: Personal history of other medical treatment: Z92.89

## 2017-04-23 HISTORY — DX: Headache, unspecified: R51.9

## 2017-04-23 HISTORY — DX: Unspecified chronic bronchitis: J42

## 2017-04-23 HISTORY — DX: Low back pain, unspecified: M54.50

## 2017-04-23 HISTORY — DX: Low back pain: M54.5

## 2017-04-23 HISTORY — DX: Vitamin B12 deficiency anemia, unspecified: D51.9

## 2017-04-23 HISTORY — DX: Headache: R51

## 2017-04-23 LAB — CBC
HEMATOCRIT: 21.5 % — AB (ref 36.0–46.0)
HEMOGLOBIN: 7 g/dL — AB (ref 12.0–15.0)
MCH: 26.6 pg (ref 26.0–34.0)
MCHC: 32.6 g/dL (ref 30.0–36.0)
MCV: 81.7 fL (ref 78.0–100.0)
Platelets: 146 10*3/uL — ABNORMAL LOW (ref 150–400)
RBC: 2.63 MIL/uL — AB (ref 3.87–5.11)
RDW: 27.7 % — ABNORMAL HIGH (ref 11.5–15.5)
WBC: 8.4 10*3/uL (ref 4.0–10.5)

## 2017-04-23 LAB — OCCULT BLOOD X 1 CARD TO LAB, STOOL: FECAL OCCULT BLD: NEGATIVE

## 2017-04-23 LAB — BASIC METABOLIC PANEL
Anion gap: 9 (ref 5–15)
BUN: 9 mg/dL (ref 6–20)
CALCIUM: 8.9 mg/dL (ref 8.9–10.3)
CO2: 21 mmol/L — ABNORMAL LOW (ref 22–32)
Chloride: 102 mmol/L (ref 101–111)
Creatinine, Ser: 0.64 mg/dL (ref 0.44–1.00)
GFR calc non Af Amer: >60 mL/min (ref >60)
GLUCOSE: 163 mg/dL — AB (ref 65–99)
POTASSIUM: 4 mmol/L (ref 3.5–5.1)
SODIUM: 132 mmol/L — AB (ref 135–145)

## 2017-04-23 LAB — PREPARE RBC (CROSSMATCH)

## 2017-04-23 LAB — HEMOGLOBIN AND HEMATOCRIT, BLOOD
HCT: 24.2 % — ABNORMAL LOW (ref 36.0–46.0)
HEMOGLOBIN: 7.7 g/dL — AB (ref 12.0–15.0)

## 2017-04-23 MED ORDER — BUDESONIDE 0.25 MG/2ML IN SUSP
0.2500 mg | Freq: Two times a day (BID) | RESPIRATORY_TRACT | Status: DC
  Administered 2017-04-24: 0.25 mg via RESPIRATORY_TRACT
  Filled 2017-04-23 (×5): qty 2

## 2017-04-23 MED ORDER — PANTOPRAZOLE SODIUM 40 MG PO TBEC
40.0000 mg | DELAYED_RELEASE_TABLET | Freq: Every day | ORAL | Status: DC
  Administered 2017-04-23 – 2017-04-24 (×2): 40 mg via ORAL
  Filled 2017-04-23 (×2): qty 1

## 2017-04-23 MED ORDER — CYANOCOBALAMIN 1000 MCG/ML IJ SOLN
1000.0000 ug | Freq: Once | INTRAMUSCULAR | Status: DC
  Filled 2017-04-23: qty 1

## 2017-04-23 MED ORDER — SODIUM CHLORIDE 0.9 % IV SOLN
10.0000 mL/h | Freq: Once | INTRAVENOUS | Status: AC
  Administered 2017-04-23: 10 mL/h via INTRAVENOUS

## 2017-04-23 MED ORDER — ONDANSETRON HCL 4 MG/2ML IJ SOLN: 4.0000 mg | Freq: Four times a day (QID) | INTRAMUSCULAR | Status: DC | PRN

## 2017-04-23 MED ORDER — MELATONIN 3 MG PO TABS
3.0000 mg | ORAL_TABLET | Freq: Every evening | ORAL | Status: DC | PRN
  Administered 2017-04-23: 3 mg via ORAL
  Filled 2017-04-23 (×2): qty 1

## 2017-04-23 MED ORDER — NON FORMULARY: 3.0000 mg | Freq: Every evening | Status: DC | PRN

## 2017-04-23 MED ORDER — ACETAMINOPHEN 325 MG PO TABS: 650.0000 mg | ORAL_TABLET | Freq: Four times a day (QID) | ORAL | Status: DC | PRN

## 2017-04-23 MED ORDER — LINACLOTIDE 145 MCG PO CAPS
145.0000 ug | ORAL_CAPSULE | Freq: Every day | ORAL | Status: DC
  Administered 2017-04-23 – 2017-04-24 (×2): 145 ug via ORAL
  Filled 2017-04-23 (×3): qty 1

## 2017-04-23 MED ORDER — ONDANSETRON HCL 4 MG PO TABS: 4.0000 mg | ORAL_TABLET | Freq: Four times a day (QID) | ORAL | Status: DC | PRN

## 2017-04-23 MED ORDER — ACETAMINOPHEN 650 MG RE SUPP: 650.0000 mg | Freq: Four times a day (QID) | RECTAL | Status: DC | PRN

## 2017-04-23 MED ORDER — TRAMADOL HCL 50 MG PO TABS: 50.0000 mg | ORAL_TABLET | Freq: Three times a day (TID) | ORAL | Status: DC | PRN

## 2017-04-23 MED ORDER — ALBUTEROL SULFATE (2.5 MG/3ML) 0.083% IN NEBU: 2.5000 mg | INHALATION_SOLUTION | RESPIRATORY_TRACT | Status: DC | PRN

## 2017-04-23 MED ORDER — SENNOSIDES-DOCUSATE SODIUM 8.6-50 MG PO TABS
1.0000 | ORAL_TABLET | Freq: Two times a day (BID) | ORAL | Status: DC
  Filled 2017-04-23: qty 1

## 2017-04-23 MED ORDER — CYCLOBENZAPRINE HCL 5 MG PO TABS: 5.0000 mg | ORAL_TABLET | Freq: Three times a day (TID) | ORAL | Status: DC | PRN

## 2017-04-23 MED ORDER — ALBUTEROL SULFATE (2.5 MG/3ML) 0.083% IN NEBU
2.5000 mg | INHALATION_SOLUTION | RESPIRATORY_TRACT | Status: DC
  Administered 2017-04-23: 2.5 mg via RESPIRATORY_TRACT
  Filled 2017-04-23: qty 3

## 2017-04-23 MED ORDER — POLYETHYLENE GLYCOL 3350 17 G PO PACK
17.0000 g | PACK | Freq: Two times a day (BID) | ORAL | Status: DC
  Filled 2017-04-23: qty 1

## 2017-04-23 MED ORDER — POTASSIUM CHLORIDE CRYS ER 20 MEQ PO TBCR: 20.0000 meq | EXTENDED_RELEASE_TABLET | Freq: Once | ORAL | Status: DC

## 2017-04-23 MED ORDER — SODIUM CHLORIDE 0.9 % IV SOLN
Freq: Once | INTRAVENOUS | Status: AC
  Administered 2017-04-23: 15:00:00 via INTRAVENOUS

## 2017-04-23 MED ORDER — CYANOCOBALAMIN 1000 MCG/ML IJ SOLN
1000.0000 ug | Freq: Every day | INTRAMUSCULAR | Status: DC
  Administered 2017-04-23 – 2017-04-24 (×2): 1000 ug via SUBCUTANEOUS
  Filled 2017-04-23 (×2): qty 1

## 2017-04-23 MED ORDER — FOLIC ACID 5 MG/ML IJ SOLN
1.0000 mg | Freq: Every day | INTRAMUSCULAR | Status: DC
  Administered 2017-04-23 – 2017-04-24 (×2): 1 mg via INTRAVENOUS
  Filled 2017-04-23 (×2): qty 0.2

## 2017-04-23 MED ORDER — PNEUMOCOCCAL VAC POLYVALENT 25 MCG/0.5ML IJ INJ
0.5000 mL | INJECTION | INTRAMUSCULAR | Status: DC
  Filled 2017-04-23: qty 0.5

## 2017-04-23 NOTE — ED Provider Notes (Addendum)
[place] EMERGENCY DEPARTMENT Provider Note   CSN: [id number] Arrival date & time: 04/22/17  2155     History   Chief Complaint Chief Complaint  Patient presents with  . Low Hemoglobin    HPI Courtney Davies is a 42 y.o. female.  The history is provided by the patient.  Illness  This is a recurrent problem. The current episode started more than 1 week ago. The problem occurs constantly. The problem has not changed since onset.Pertinent negatives include no chest pain, no abdominal pain, no headaches and no shortness of breath. Nothing aggravates the symptoms. Nothing relieves the symptoms. She has tried nothing for the symptoms. The treatment provided no relief.  Sent in from PMD for acute change in hemoglobin.  Has had low HGB in the past.  No f/c/r.  States she has had dark stools and is B12 deficient   Past Medical History:  Diagnosis Date  . Anemia 09/2015  . Arthritis   . Asthma    as a child  . Chronic back pain   . Fatty liver     Patient Active Problem List   Diagnosis Date Noted  . Chronic back pain 10/03/2015  . Symptomatic anemia 04/10/2014  . Bilateral edema of lower extremity   . Low back pain with left-sided sciatica     Past Surgical History:  Procedure Laterality Date  . ABDOMINAL HYSTERECTOMY    . CESAREAN SECTION     X 2  . HERNIA REPAIR     x 4    OB History    No data available       Home Medications    Prior to Admission medications   Medication Sig Start Date End Date Taking? Authorizing Provider  albuterol (PROVENTIL HFA;VENTOLIN HFA) 108 (90 Base) MCG/ACT inhaler Inhale 2 puffs into the lungs every 6 (six) hours as needed for wheezing or shortness of breath. 10/04/15   Thurnell Lose, MD  cyanocobalamin (,VITAMIN B-12,) 1000 MCG/ML injection Inject 70m SQ daily for 7 days then every 2 weeks. 10/26/15   NMauri Pole MD  cyclobenzaprine (FLEXERIL) 5 MG tablet Take 1 tablet (5 mg total) by mouth 3 (three)  times daily as needed for muscle spasms (leg pain, back pain). 04/13/14   Regalado, Belkys A, MD  folic acid (FOLVITE) 1 MG tablet TAKE 1 TABLET(1 MG) BY MOUTH DAILY 02/14/16   Nandigam, KVenia Minks MD  Insulin Syringe-Needle U-100 (B-D INSULIN SYRINGE 1CC/25GX1") 25G X 1" 1 ML MISC Use for subcutaneous B-12 shots. Provide 15 syringes. Patient not taking: Reported on 12/05/2015 10/04/15   SThurnell Lose MD  LINZESS 145 MCG CAPS capsule TAKE 1 CAPSULE(145 MCG) BY MOUTH DAILY BEFORE BREAKFAST 09/17/16   Nandigam, KVenia Minks MD  pantoprazole (PROTONIX) 40 MG tablet Take 1 tablet (40 mg total) by mouth daily. 10/04/15   SThurnell Lose MD  polyethylene glycol (MIRALAX / GLYCOLAX) packet Take 17 g by mouth 2 (two) times daily. 04/13/14   Regalado, Belkys A, MD  senna-docusate (SENOKOT-S) 8.6-50 MG per tablet Take 1 tablet by mouth 2 (two) times daily. 04/14/14   Regalado, Belkys A, MD  traMADol (ULTRAM) 50 MG tablet Take 1 tablet (50 mg total) by mouth every 8 (eight) hours as needed for moderate pain. 04/13/14   Regalado, BCassie Freer MD    Family History Family History  Problem Relation Age of Onset  . Anemia Paternal Grandmother   . Valvular heart disease Paternal Grandmother   .  Diabetes Mother   . Hypertension Mother   . Diabetes Maternal Grandmother   . Prostate cancer Maternal Uncle        ? intestinal also    Social History Social History   Tobacco Use  . Smoking status: Current Every Day Smoker    Packs/day: 0.50    Years: 13.00    Pack years: 6.50    Types: Cigarettes  . Smokeless tobacco: Never Used  Substance Use Topics  . Alcohol use: Yes    Alcohol/week: 0.6 oz    Types: 1 Shots of liquor per week    Comment: ocassional  . Drug use: Yes    Comment: Marijuana, daily     Allergies   Codeine and Other   Review of Systems Review of Systems  Constitutional: Negative for fever.  Eyes: Negative for photophobia.  Respiratory: Negative for shortness of breath.     Cardiovascular: Negative for chest pain.  Gastrointestinal: Negative for abdominal pain.  Neurological: Negative for headaches.  All other systems reviewed and are negative.    Physical Exam Updated Vital Signs BP 121/67 (BP Location: Left Arm)   Pulse 97   Temp 99.3 F (37.4 C) (Oral)   Resp 16   Ht 5' 7"  (1.702 m)   Wt 97.5 kg (215 lb)   LMP 12/03/2012   SpO2 100%   BMI 33.67 kg/m   Physical Exam  Constitutional: She is oriented to person, place, and time. She appears well-developed and well-nourished. No distress.  HENT:  Head: Normocephalic and atraumatic.  Nose: Nose normal.  Mouth/Throat: No oropharyngeal exudate.  Eyes: EOM are normal. Pupils are equal, round, and reactive to light.  pale  Neck: Normal range of motion. Neck supple.  Cardiovascular: Normal rate, regular rhythm, normal heart sounds and intact distal pulses.  Pulmonary/Chest: Effort normal and breath sounds normal. No stridor. She has no wheezes. She has no rales.  Abdominal: Soft. Bowel sounds are normal. She exhibits no mass. There is no tenderness. There is no rebound and no guarding.  Musculoskeletal: Normal range of motion. She exhibits no edema.  Neurological: She is alert and oriented to person, place, and time. She displays normal reflexes.  Skin: Capillary refill takes less than 2 seconds.  Psychiatric: She is agitated and aggressive.  Nursing note and vitals reviewed.    ED Treatments / Results  Labs (all labs ordered are listed, but only abnormal results are displayed)  Results for orders placed or performed during the hospital encounter of 04/23/17  CBC with Differential  Result Value Ref Range   WBC 6.8 4.0 - 10.5 K/uL   RBC 2.06 (L) 3.87 - 5.11 MIL/uL   Hemoglobin 5.3 (LL) 12.0 - 15.0 g/dL   HCT 16.7 (L) 36.0 - 46.0 %   MCV 81.1 78.0 - 100.0 fL   MCH 25.7 (L) 26.0 - 34.0 pg   MCHC 31.7 30.0 - 36.0 g/dL   RDW 34.7 (H) 11.5 - 15.5 %   Platelets 190 150 - 400 K/uL    Neutrophils Relative % 48 %   Lymphocytes Relative 47 %   Monocytes Relative 3 %   Eosinophils Relative 2 %   Basophils Relative 0 %   Neutro Abs 3.3 1.7 - 7.7 K/uL   Lymphs Abs 3.2 0.7 - 4.0 K/uL   Monocytes Absolute 0.2 0.1 - 1.0 K/uL   Eosinophils Absolute 0.1 0.0 - 0.7 K/uL   Basophils Absolute 0.0 0.0 - 0.1 K/uL   RBC Morphology ELLIPTOCYTES  Comprehensive metabolic panel  Result Value Ref Range   Sodium 136 135 - 145 mmol/L   Potassium 3.4 (L) 3.5 - 5.1 mmol/L   Chloride 105 101 - 111 mmol/L   CO2 22 22 - 32 mmol/L   Glucose, Bld 121 (H) 65 - 99 mg/dL   BUN 10 6 - 20 mg/dL   Creatinine, Ser 0.65 0.44 - 1.00 mg/dL   Calcium 9.2 8.9 - 10.3 mg/dL   Total Protein 6.7 6.5 - 8.1 g/dL   Albumin 4.2 3.5 - 5.0 g/dL   AST 120 (H) 15 - 41 U/L   ALT 46 14 - 54 U/L   Alkaline Phosphatase 59 38 - 126 U/L   Total Bilirubin 0.8 0.3 - 1.2 mg/dL   GFR calc non Af Amer >60 >60 mL/min   GFR calc Af Amer >60 >60 mL/min   Anion gap 9 5 - 15  Type and screen [place]  Result Value Ref Range   ABO/RH(D) O POS    Antibody Screen NEG    Sample Expiration 04/25/2017    Unit Number [id number]    Blood Component Type RBC LR PHER1    Unit division 00    Status of Unit ALLOCATED    Donor AG Type NEGATIVE FOR E ANTIGEN    Transfusion Status OK TO TRANSFUSE    Crossmatch Result COMPATIBLE    Unit Number [id number]    Blood Component Type RBC LR PHER1    Unit division 00    Status of Unit ALLOCATED    Donor AG Type NEGATIVE FOR E ANTIGEN    Transfusion Status OK TO TRANSFUSE    Crossmatch Result COMPATIBLE   BPAM RBC  Result Value Ref Range   Blood Product Unit Number [id number]    Unit Type and Rh 5100    Blood Product Expiration Date 440102725366    Blood Product Unit Number [id number]    Unit Type and Rh 5100    Blood Product Expiration Date [id number]    No results found.  Radiology No results found.  Procedures Procedures (including critical  care time)  Medications Ordered in ED Medications  0.9 %  sodium chloride infusion (not administered)     Refused rectal with nurse present   Final Clinical Impressions(s) / ED Diagnoses   Will admit for symptomatic anemia   Brihanna Devenport, MD 04/23/17 0131    Sarayah Bacchi, MD 04/23/17 8416

## 2017-04-23 NOTE — ED Notes (Signed)
Ordered tray

## 2017-04-23 NOTE — ED Notes (Signed)
Attempted report 

## 2017-04-23 NOTE — ED Notes (Signed)
EDP at bedside. Pt refused rectal exam. Does endorse tarry stools at times. Pt states she is "just anemic" and doesn't need those exams.  Pt states she was seen over a year ago and told to begin a gluten free diet but has been unable to follow this due to financial hardship

## 2017-04-23 NOTE — H&P (Signed)
History and Physical    Courtney Davies IRS:[id number] DOB: 04/14/1976 DOA: 04/23/2017  PCP: System, Pcp Not In  Patient coming from: Home.  Chief Complaint: Low hemoglobin.  HPI: Courtney Davies is a 42 y.o. female with known history of B12 and folate deficiency anemia had gone to her PCP yesterday with complaints of increasing shortness of breath and chest pressure on exertion for the last 2 months.  Patient also has been having some wheezing.  Denies any obvious rectal bleeding but did see some stools off and on which were dark.  Denies any nausea vomiting.  Patient has been previously diagnosed with B12 and folate deficiency has not taken her supplements since he has not had any insurance as per the patient.  At the PCPs office patient had basic lab work done which showed low hemoglobin and was advised to come to the ER.  In 2017 patient had presented with similar picture and had followed up with gastroenterologist Dr. Silverio Decamp who had done EGD and colonoscopy.  Colonoscopy showed nonbleeding hemorrhoids with a polyp 5 mm.  An EGD showed erythematous mucosa with a sessile polyp.  Workup at that time showed TTG was negative gliadin IgA antibody was positive with biopsy showing villous atrophy and patient was advised to have a gluten-free diet for possible celiac disease.  Not sure if patient had been following up with that diet.  ED Course: In the ER patient had repeat labs done which showed hemoglobin to be around 5.3.  Patient as per the ER patient refused rectal exam.  2 units of PRBC transfusion has been already ordered by the time I examined.  Patient has had anemia profile in PCPs office which we will need to get the results from.  Patient's EKG shows normal sinus rhythm.  On exam patient is wheezing.  Review of Systems: As per HPI, rest all negative.   Past Medical History:  Diagnosis Date  . Anemia 09/2015  . Arthritis   . Asthma    as a child  . Chronic back pain   . Fatty liver       Past Surgical History:  Procedure Laterality Date  . ABDOMINAL HYSTERECTOMY    . CESAREAN SECTION     X 2  . HERNIA REPAIR     x 4     reports that she has been smoking cigarettes.  She has a 6.50 pack-year smoking history. she has never used smokeless tobacco. She reports that she drinks about 0.6 oz of alcohol per week. She reports that she uses drugs.  Allergies  Allergen Reactions  . Codeine Nausea And Vomiting  . Other Other (See Comments)    Hair glue causes throat to close and hives    Family History  Problem Relation Age of Onset  . Anemia Paternal Grandmother   . Valvular heart disease Paternal Grandmother   . Diabetes Mother   . Hypertension Mother   . Diabetes Maternal Grandmother   . Prostate cancer Maternal Uncle        ? intestinal also    Prior to Admission medications   Medication Sig Start Date End Date Taking? Authorizing Provider  albuterol (PROVENTIL HFA;VENTOLIN HFA) 108 (90 Base) MCG/ACT inhaler Inhale 2 puffs into the lungs every 6 (six) hours as needed for wheezing or shortness of breath. 10/04/15   Thurnell Lose, MD  cyanocobalamin (,VITAMIN B-12,) 1000 MCG/ML injection Inject 29m SQ daily for 7 days then every 2 weeks. 10/26/15   Nandigam,  Venia Minks, MD  cyclobenzaprine (FLEXERIL) 5 MG tablet Take 1 tablet (5 mg total) by mouth 3 (three) times daily as needed for muscle spasms (leg pain, back pain). 04/13/14   Regalado, Belkys A, MD  folic acid (FOLVITE) 1 MG tablet TAKE 1 TABLET(1 MG) BY MOUTH DAILY 02/14/16   Nandigam, Venia Minks, MD  Insulin Syringe-Needle U-100 (B-D INSULIN SYRINGE 1CC/25GX1") 25G X 1" 1 ML MISC Use for subcutaneous B-12 shots. Provide 15 syringes. Patient not taking: Reported on 12/05/2015 10/04/15   Thurnell Lose, MD  LINZESS 145 MCG CAPS capsule TAKE 1 CAPSULE(145 MCG) BY MOUTH DAILY BEFORE BREAKFAST 09/17/16   Nandigam, Venia Minks, MD  pantoprazole (PROTONIX) 40 MG tablet Take 1 tablet (40 mg total) by mouth daily. 10/04/15    Thurnell Lose, MD  polyethylene glycol (MIRALAX / GLYCOLAX) packet Take 17 g by mouth 2 (two) times daily. 04/13/14   Regalado, Belkys A, MD  senna-docusate (SENOKOT-S) 8.6-50 MG per tablet Take 1 tablet by mouth 2 (two) times daily. 04/14/14   Regalado, Belkys A, MD  traMADol (ULTRAM) 50 MG tablet Take 1 tablet (50 mg total) by mouth every 8 (eight) hours as needed for moderate pain. 04/13/14   Elmarie Shiley, MD    Physical Exam: Vitals:   04/22/17 2216 04/22/17 2217 04/23/17 0259 04/23/17 0300  BP: 121/67  (!) 115/50 (!) 106/53  Pulse: 97  92 87  Resp: 16  18   Temp: 99.3 F (37.4 C)  98.7 F (37.1 C)   TempSrc: Oral  Oral   SpO2: 100%   100%  Weight:  97.5 kg (215 lb)    Height:  5' 7"  (1.702 m)        Constitutional: Moderately built and nourished. Vitals:   04/22/17 2216 04/22/17 2217 04/23/17 0259 04/23/17 0300  BP: 121/67  (!) 115/50 (!) 106/53  Pulse: 97  92 87  Resp: 16  18   Temp: 99.3 F (37.4 C)  98.7 F (37.1 C)   TempSrc: Oral  Oral   SpO2: 100%   100%  Weight:  97.5 kg (215 lb)    Height:  5' 7"  (1.702 m)     Eyes: Anicteric mild pallor. ENMT: No discharge from the ears eyes nose or mouth. Neck: No mass felt.  No neck rigidity. Respiratory: Bilateral expiratory wheeze heard no crepitations. Cardiovascular: S1-S2 heard no murmurs appreciated. Abdomen: Soft nontender bowel sounds present. Musculoskeletal: No edema.  No joint effusion. Skin: No rash. Neurologic: Alert awake oriented to time place and person.  Moves all extremities. Psychiatric: Appears normal.  Normal affect.   Labs on Admission: I have personally reviewed following labs and imaging studies  CBC: Recent Labs  Lab 04/22/17 2225  WBC 6.8  NEUTROABS 3.3  HGB 5.3*  HCT 16.7*  MCV 81.1  PLT 509   Basic Metabolic Panel: Recent Labs  Lab 04/22/17 2225  NA 136  K 3.4*  CL 105  CO2 22  GLUCOSE 121*  BUN 10  CREATININE 0.65  CALCIUM 9.2   GFR: Estimated  Creatinine Clearance: 112.2 mL/min (by C-G formula based on SCr of 0.65 mg/dL). Liver Function Tests: Recent Labs  Lab 04/22/17 2225  AST 120*  ALT 46  ALKPHOS 59  BILITOT 0.8  PROT 6.7  ALBUMIN 4.2   No results for input(s): LIPASE, AMYLASE in the last 168 hours. No results for input(s): AMMONIA in the last 168 hours. Coagulation Profile: No results for input(s): INR, PROTIME in the  last 168 hours. Cardiac Enzymes: No results for input(s): CKTOTAL, CKMB, CKMBINDEX, TROPONINI in the last 168 hours. BNP (last 3 results) No results for input(s): PROBNP in the last 8760 hours. HbA1C: No results for input(s): HGBA1C in the last 72 hours. CBG: No results for input(s): GLUCAP in the last 168 hours. Lipid Profile: No results for input(s): CHOL, HDL, LDLCALC, TRIG, CHOLHDL, LDLDIRECT in the last 72 hours. Thyroid Function Tests: No results for input(s): TSH, T4TOTAL, FREET4, T3FREE, THYROIDAB in the last 72 hours. Anemia Panel: No results for input(s): VITAMINB12, FOLATE, FERRITIN, TIBC, IRON, RETICCTPCT in the last 72 hours. Urine analysis:    Component Value Date/Time   COLORURINE YELLOW 10/03/2015 [place] 10/03/2015 1608   LABSPEC 1.016 10/03/2015 1608   PHURINE 7.5 10/03/2015 1608   GLUCOSEU NEGATIVE 10/03/2015 1608   HGBUR TRACE (A) 10/03/2015 1608   BILIRUBINUR NEGATIVE 10/03/2015 1608   KETONESUR NEGATIVE 10/03/2015 1608   PROTEINUR 30 (A) 10/03/2015 1608   UROBILINOGEN 1.0 02/21/2014 1544   NITRITE NEGATIVE 10/03/2015 1608   LEUKOCYTESUR NEGATIVE 10/03/2015 1608   Sepsis Labs: @LABRCNTIP (procalcitonin:4,lacticidven:4) )No results found for this or any previous visit (from the past 240 hour(s)).   Radiological Exams on Admission: No results found.  EKG: Independently reviewed.  Normal sinus rhythm.  Assessment/Plan Principal Problem:   Symptomatic anemia Active Problems:   Anemia   Acute bronchitis    1. Severe symptomatic anemia -2  units of PRBC transfusion has been ordered.  Please check with patient's PCP in a.m. about the anemia profile results.  For now I have ordered B12 thousand micrograms subcutaneous 1 dose.  Patient probably will need further doses and also folate.  Repeat CBC after transfusion.  Check stool studies since patient also takes NSAIDs off and on. 2. Acute bronchitis -advised to quit smoking.  Patient is wheezing on exam for which I have placed patient on albuterol nebulizer and Pulmicort. 3. History of IBS on Linzess and PPI.   DVT prophylaxis: SCDs for now. Code Status: Full code. Family Communication: Discussed with patient. Disposition Plan: Home. Consults called: None. Admission status: Observation.   Rise Patience MD [facility] Pager [phone].  If 7PM-7AM, please contact night-coverage [email or web address] Password [email or web address]  04/23/2017, 5:30 AM

## 2017-04-23 NOTE — Progress Notes (Signed)
PROGRESS NOTE    Courtney Davies  POE:[id number] DOB: 16-Apr-1976 DOA: 04/23/2017 PCP: System, Pcp Not In   Brief Narrative:Courtney Davies is a 42 y.o. female with known history of B12 and folate deficiency anemia had gone to her PCP yesterday with complaints of increasing shortness of breath and chest pressure on exertion for the last 2 months.  Patient also has been having some wheezing.  Denies any obvious rectal bleeding but did see some stools off and on which were dark.  Denies any nausea vomiting.  Patient has been previously diagnosed with B12 and folate deficiency has not taken her supplements since he has not had any insurance as per the patient.  At the PCPs office patient had basic lab work done which showed low hemoglobin and was advised to come to the ER.  In 2017 patient had presented with similar picture and had followed up with gastroenterologist Dr. Silverio Decamp who had done EGD and colonoscopy.  Colonoscopy showed nonbleeding hemorrhoids with a polyp 5 mm.  An EGD showed erythematous mucosa with a sessile polyp.  Workup at that time showed TTG was negative gliadin IgA antibody was positive with biopsy showing villous atrophy and patient was advised to have a gluten-free diet for possible celiac disease.  Not sure if patient had been following up with that diet.    Assessment & Plan:   Principal Problem:   Symptomatic anemia Active Problems:   Anemia   Acute bronchitis   1-Severe anemia; suspect related to vitamin deficiency.  Await occult blood.  Received 2 units PRBC, await results cbc.  Might need further blood transfusion.  Request nurse to get patient ' s  Anemia panel results from PCP office.   2-Celiac diseases;  Nutrition consulted for diet education.   3- Bronchitis; nebulizer.   History of IBS on Linzess and PPI.    DVT prophylaxis: scd Code Status: full code.  Family Communication: discussed with patient.  Disposition Plan: home at time of discharge     Consultants:   none   Procedures: none   Antimicrobials: none   Subjective: She denies hematochezia, hematemesis. She has not been taking vitamins.  Just had BM   Objective: Vitals:   04/23/17 0751 04/23/17 0815 04/23/17 0915 04/23/17 0916  BP: 109/75  119/76 119/76  Pulse: (!) 112 (!) 110 97 97  Resp: 20 (!) 22 16 16   Temp:   99.2 F (37.3 C) 99.2 F (37.3 C)  TempSrc:    Oral  SpO2: 100% 99% 100% 100%  Weight:      Height:        Intake/Output Summary (Last 24 hours) at 04/23/2017 0936 Last data filed at 04/23/2017 0931 Gross per 24 hour  Intake 717.83 ml  Output -  Net 717.83 ml   Filed Weights   04/22/17 2217  Weight: 97.5 kg (215 lb)    Examination:  General exam: Appears calm and comfortable  Respiratory system: Clear to auscultation. Respiratory effort normal. Cardiovascular system: S1 & S2 heard, RRR. No JVD, murmurs, rubs, gallops or clicks. No pedal edema. Gastrointestinal system: Abdomen is nondistended, soft and nontender. No organomegaly or masses felt. Normal bowel sounds heard. Central nervous system: Alert and oriented. No focal neurological deficits. Extremities: Symmetric 5 x 5 power. Skin: No rashes, lesions or ulcers    Data Reviewed: I have personally reviewed following labs and imaging studies  CBC: Recent Labs  Lab 04/22/17 2225  WBC 6.8  NEUTROABS 3.3  HGB 5.3*  HCT 16.7*  MCV 81.1  PLT 340   Basic Metabolic Panel: Recent Labs  Lab 04/22/17 2225  NA 136  K 3.4*  CL 105  CO2 22  GLUCOSE 121*  BUN 10  CREATININE 0.65  CALCIUM 9.2   GFR: Estimated Creatinine Clearance: 112.2 mL/min (by C-G formula based on SCr of 0.65 mg/dL). Liver Function Tests: Recent Labs  Lab 04/22/17 2225  AST 120*  ALT 46  ALKPHOS 59  BILITOT 0.8  PROT 6.7  ALBUMIN 4.2   No results for input(s): LIPASE, AMYLASE in the last 168 hours. No results for input(s): AMMONIA in the last 168 hours. Coagulation Profile: No results for  input(s): INR, PROTIME in the last 168 hours. Cardiac Enzymes: No results for input(s): CKTOTAL, CKMB, CKMBINDEX, TROPONINI in the last 168 hours. BNP (last 3 results) No results for input(s): PROBNP in the last 8760 hours. HbA1C: No results for input(s): HGBA1C in the last 72 hours. CBG: No results for input(s): GLUCAP in the last 168 hours. Lipid Profile: No results for input(s): CHOL, HDL, LDLCALC, TRIG, CHOLHDL, LDLDIRECT in the last 72 hours. Thyroid Function Tests: No results for input(s): TSH, T4TOTAL, FREET4, T3FREE, THYROIDAB in the last 72 hours. Anemia Panel: No results for input(s): VITAMINB12, FOLATE, FERRITIN, TIBC, IRON, RETICCTPCT in the last 72 hours. Sepsis Labs: No results for input(s): PROCALCITON, LATICACIDVEN in the last 168 hours.  No results found for this or any previous visit (from the past 240 hour(s)).       Radiology Studies: Dg Chest Port 1 View  Result Date: 04/23/2017 CLINICAL DATA:  Anemia. SOB (shortness of breath) R06.02 (ICD-10-CM) EXAM: PORTABLE CHEST 1 VIEW COMPARISON:  Radiograph 04/25/2016 FINDINGS: The cardiomediastinal contours are normal. Heart size upper normal. Pulmonary vasculature is normal. No consolidation, pleural effusion, or pneumothorax. No acute osseous abnormalities are seen. IMPRESSION: Upper normal heart size.  No acute pulmonary process. Electronically Signed   By: Jeb Levering M.D.   On: 04/23/2017 05:53        Scheduled Meds: . albuterol  2.5 mg Nebulization Q4H  . budesonide (PULMICORT) nebulizer solution  0.25 mg Nebulization BID  . cyanocobalamin  1,000 mcg Subcutaneous Once  . linaclotide  145 mcg Oral QAC breakfast  . pantoprazole  40 mg Oral Daily  . polyethylene glycol  17 g Oral BID  . potassium chloride  20 mEq Oral Once  . senna-docusate  1 tablet Oral BID   Continuous Infusions:   LOS: 0 days    Time spent: 35 minutes.     Elmarie Shiley, MD [facility] Pager [phone]  If  7PM-7AM, please contact night-coverage [email or web address] Password [facility] 04/23/2017, 9:36 AM

## 2017-04-23 NOTE — ED Notes (Signed)
Meal tray ordered at 11:39.

## 2017-04-23 NOTE — Progress Notes (Signed)
Courtney Davies is a 42 y.o. female patient admitted from ED awake, alert - oriented  X 4 - no acute distress noted.  VSS - Blood pressure (!) 106/49, pulse 83, temperature 98.4 F (36.9 C), temperature source Oral, resp. rate 12, height 5' 7"  (1.702 m), weight 103.1 kg (227 lb 4.7 oz), last menstrual period 12/03/2012, SpO2 100 %.    IV in place, occlusive dsg intact without redness.  Orientation to room, and floor completed with information packet given to patient/family.  Patient declined safety video at this time.  Admission INP armband ID verified with patient/family, and in place.   SR up x 2, fall assessment complete, with patient and family able to verbalize understanding of risk associated with falls, and verbalized understanding to call nsg before up out of bed.  Call light within reach, patient able to voice, and demonstrate understanding.  Skin, clean-dry- intact without evidence of bruising, or skin tears.   No evidence of skin break down noted on exam.   Will cont to eval and treat per MD orders.  Betha Loa Somaly Marteney, RN 04/23/2017 7:44 PM

## 2017-04-24 DIAGNOSIS — D649 Anemia, unspecified: Secondary | ICD-10-CM | POA: Diagnosis not present

## 2017-04-24 LAB — CBC
HCT: 26.5 % — ABNORMAL LOW (ref 36.0–46.0)
HEMATOCRIT: 20.6 % — AB (ref 36.0–46.0)
Hemoglobin: 6.9 g/dL — CL (ref 12.0–15.0)
Hemoglobin: 8.7 g/dL — ABNORMAL LOW (ref 12.0–15.0)
MCH: 27.2 pg (ref 26.0–34.0)
MCH: 26.6 pg (ref 26.0–34.0)
MCHC: 33.5 g/dL (ref 30.0–36.0)
MCHC: 32.8 g/dL (ref 30.0–36.0)
MCV: 81.1 fL (ref 78.0–100.0)
MCV: 81 fL (ref 78.0–100.0)
PLATELETS: 142 10*3/uL — AB (ref 150–400)
Platelets: 121 10*3/uL — ABNORMAL LOW (ref 150–400)
RBC: 2.54 MIL/uL — ABNORMAL LOW (ref 3.87–5.11)
RBC: 3.27 MIL/uL — AB (ref 3.87–5.11)
RDW: 26.5 % — AB (ref 11.5–15.5)
RDW: 26.3 % — ABNORMAL HIGH (ref 11.5–15.5)
WBC: 5.5 10*3/uL (ref 4.0–10.5)
WBC: 6.2 10*3/uL (ref 4.0–10.5)

## 2017-04-24 LAB — PREPARE RBC (CROSSMATCH)

## 2017-04-24 LAB — HIV ANTIBODY (ROUTINE TESTING W REFLEX): HIV Screen 4th Generation wRfx: NONREACTIVE

## 2017-04-24 MED ORDER — LINACLOTIDE 145 MCG PO CAPS: 145.0000 ug | ORAL_CAPSULE | Freq: Every day | ORAL | 0 refills | Status: DC

## 2017-04-24 MED ORDER — "SYRINGE 18G X 1-1/2"" 3 ML MISC": 1.0000 "application " | 0 refills | Status: DC

## 2017-04-24 MED ORDER — SODIUM CHLORIDE 0.9 % IV SOLN: Freq: Once | INTRAVENOUS | Status: DC

## 2017-04-24 MED ORDER — CYANOCOBALAMIN 1000 MCG/ML IJ SOLN: 1000.0000 ug | INTRAMUSCULAR | 0 refills | Status: AC

## 2017-04-24 MED ORDER — PANTOPRAZOLE SODIUM 40 MG PO TBEC: 40.0000 mg | DELAYED_RELEASE_TABLET | Freq: Every day | ORAL | 0 refills | Status: DC

## 2017-04-24 NOTE — Discharge Instructions (Signed)
Follow up with your PCP in 1 week.

## 2017-04-24 NOTE — Progress Notes (Signed)
CRITICAL VALUE ALERT  Critical Value:  Hemoglobin 6.9  Date & Time Notied:  04/24/2017 @ 0520  Provider Notified: Keturah Barre  Orders Received/Actions taken: non awaiting response

## 2017-04-24 NOTE — Progress Notes (Signed)
Keturah Barre, the nurse practitioner paged twice about pt hemoglobin will continue to monitor pt

## 2017-04-24 NOTE — Discharge Summary (Signed)
Physician Discharge Summary  Courtney Davies Courtney Davies TKP:[id number] DOB: Dec 06, 1975 DOA: 04/23/2017  PCP: System, Pcp Not In  Admit date: 04/23/2017 Discharge date: 04/24/2017  Admitted From: Home  Disposition: Home   Recommendations for Outpatient Follow-up:  1. Follow up with PCP in 1-2 weeks 2. Please obtain BMP/CBC in one week 3. Needs further B 12 injection supplement. She has presume history of celiac diseases.   Home Health: no   Discharge Condition: Stable.  CODE STATUS: Full code.  Diet recommendation: Gluten free diet   Brief/Interim Summary: Brief Narrative:Courtney Davies a 42 y.o.femalewithknown history of B12 and folate deficiency anemia had gone to her PCP yesterday with complaints of increasing shortness of breath and chest pressure on exertion for the last 2 months. Patient also has been having some wheezing. Denies any obvious rectal bleeding but did see some stools off and on which were dark. Denies any nausea vomiting. Patient has been previously diagnosed with B12 and folate deficiency has not taken her supplements since he has not had any insurance as per the patient. At the PCPs office patient had basic lab work done which showed low hemoglobin and was advised to come to the ER.  In 2017 patient had presented with similar picture and had followed up with gastroenterologist Dr. Silverio Davies who had done EGD and colonoscopy. Colonoscopy showed nonbleeding hemorrhoids with a polyp 5 mm. An EGD showed erythematous mucosa with a sessile polyp. Workup at that time showed TTG was negative gliadin IgA antibody was positive with biopsy showing villous atrophy and patient was advised to have a gluten-free diet for possible celiac disease. Not sure if patient had been following up with that diet.    Assessment & Plan:   Principal Problem:   Symptomatic anemia Active Problems:   Anemia   Acute bronchitis   1-Severe anemia; suspect related to vitamin B-12   deficiency.  Received total of 3 units PRBC. Hb increase to 8.7. Suspect hb last night at 6.9 was not accurate.  She denies melena, hematochezia, no abdominal pain/  B 12 perform at [facility] less than 50.  She will be discharge on B 12 injection 1,000 weekly.  Needs repeat level. Will likely need further supplementation.  Hb stable. She is feeling better.   2-Celiac diseases;  Nutrition consulted for diet education.  Patient aware that she needs to follow a gluten free diet   3- Bronchitis; nebulizer.   History of IBS on Linzess and PPI.    Discharge Diagnoses:  Principal Problem:   Symptomatic anemia Active Problems:   Anemia   Acute bronchitis    Discharge Instructions  Discharge Instructions    Diet - low sodium heart healthy   Complete by:  As directed    Increase activity slowly   Complete by:  As directed      Allergies as of 04/24/2017      Reactions   Codeine Nausea And Vomiting   Other Other (See Comments)   Hair glue causes throat to close and hives      Medication List    STOP taking these medications   naproxen sodium 220 MG tablet Commonly known as:  ALEVE     TAKE these medications   albuterol 108 (90 Base) MCG/ACT inhaler Commonly known as:  PROVENTIL HFA;VENTOLIN HFA Inhale 2 puffs into the lungs every 6 (six) hours as needed for wheezing or shortness of breath.   cyanocobalamin 1000 MCG/ML injection Commonly known as:  (VITAMIN B-12) Inject 1 mL (1,000  mcg total) into the skin once a week for 6 doses.   linaclotide 145 MCG Caps capsule Commonly known as:  LINZESS Take 1 capsule (145 mcg total) by mouth daily before breakfast. What changed:  See the new instructions.   MUCINEX D 60-600 MG 12 hr tablet Generic drug:  pseudoephedrine-guaifenesin Take 1 tablet by mouth 2 (two) times daily as needed. Mucous relief   pantoprazole 40 MG tablet Commonly known as:  PROTONIX Take 1 tablet (40 mg total) by mouth daily.   SYRINGE 3CC/18GX1-1/2"  18G X 1-1/2" 3 ML Misc 1 application by Does not apply route once a week.   vitamin C 500 MG tablet Commonly known as:  ASCORBIC ACID Take 500 mg by mouth daily.       Allergies  Allergen Reactions  . Codeine Nausea And Vomiting  . Other Other (See Comments)    Hair glue causes throat to close and hives    Consultations:  none   Procedures/Studies: Dg Chest Port 1 View  Result Date: 04/23/2017 CLINICAL DATA:  Anemia. SOB (shortness of breath) R06.02 (ICD-10-CM) EXAM: PORTABLE CHEST 1 VIEW COMPARISON:  Radiograph 04/25/2016 FINDINGS: The cardiomediastinal contours are normal. Heart size upper normal. Pulmonary vasculature is normal. No consolidation, pleural effusion, or pneumothorax. No acute osseous abnormalities are seen. IMPRESSION: Upper normal heart size.  No acute pulmonary process. Electronically Signed   By: Courtney Davies M.D.   On: 04/23/2017 05:53      Subjective: Still feel tired, but much better  than on admission.   Discharge Exam: Vitals:   04/24/17 0617 04/24/17 1024  BP: 129/69   Pulse: 63   Resp: 16   Temp: 98 F (36.7 C)   SpO2: 100% 99%   Vitals:   04/23/17 1739 04/23/17 2244 04/24/17 0617 04/24/17 1024  BP: (!) 106/49 113/76 129/69   Pulse: 83 79 63   Resp: 12 18 16    Temp: 98.4 F (36.9 C) 98.4 F (36.9 C) 98 F (36.7 C)   TempSrc: Oral Oral Oral   SpO2: 100% 100% 100% 99%  Weight: 103.1 kg (227 lb 4.7 oz)     Height: 5' 7"  (1.702 m)       General: Pt is alert, awake, not in acute distress Cardiovascular: RRR, S1/S2 +, no rubs, no gallops Respiratory: CTA bilaterally, no wheezing, no rhonchi Abdominal: Soft, NT, ND, bowel sounds + Extremities: no edema, no cyanosis    The results of significant diagnostics from this hospitalization (including imaging, microbiology, ancillary and laboratory) are listed below for reference.     Microbiology: No results found for this or any previous visit (from the past 240 hour(s)).    Labs: BNP (last 3 results) No results for input(s): BNP in the last 8760 hours. Basic Metabolic Panel: Recent Labs  Lab 04/22/17 2225 04/23/17 1200  NA 136 132*  K 3.4* 4.0  CL 105 102  CO2 22 21*  GLUCOSE 121* 163*  BUN 10 9  CREATININE 0.65 0.64  CALCIUM 9.2 8.9   Liver Function Tests: Recent Labs  Lab 04/22/17 2225  AST 120*  ALT 46  ALKPHOS 59  BILITOT 0.8  PROT 6.7  ALBUMIN 4.2   No results for input(s): LIPASE, AMYLASE in the last 168 hours. No results for input(s): AMMONIA in the last 168 hours. CBC: Recent Labs  Lab 04/22/17 2225 04/23/17 1200 04/23/17 1845 04/24/17 0432 04/24/17 0743  WBC 6.8 8.4  --  5.5 6.2  NEUTROABS 3.3  --   --   --   --  HGB 5.3* 7.0* 7.7* 6.9* 8.7*  HCT 16.7* 21.5* 24.2* 20.6* 26.5*  MCV 81.1 81.7  --  81.1 81.0  PLT 190 146*  --  121* 142*   Cardiac Enzymes: No results for input(s): CKTOTAL, CKMB, CKMBINDEX, TROPONINI in the last 168 hours. BNP: Invalid input(s): POCBNP CBG: No results for input(s): GLUCAP in the last 168 hours. D-Dimer No results for input(s): DDIMER in the last 72 hours. Hgb A1c No results for input(s): HGBA1C in the last 72 hours. Lipid Profile No results for input(s): CHOL, HDL, LDLCALC, TRIG, CHOLHDL, LDLDIRECT in the last 72 hours. Thyroid function studies No results for input(s): TSH, T4TOTAL, T3FREE, THYROIDAB in the last 72 hours.  Invalid input(s): FREET3 Anemia work up No results for input(s): VITAMINB12, FOLATE, FERRITIN, TIBC, IRON, RETICCTPCT in the last 72 hours. Urinalysis    Component Value Date/Time   COLORURINE YELLOW 10/03/2015 [place] 10/03/2015 1608   LABSPEC 1.016 10/03/2015 1608   PHURINE 7.5 10/03/2015 1608   GLUCOSEU NEGATIVE 10/03/2015 1608   HGBUR TRACE (A) 10/03/2015 1608   BILIRUBINUR NEGATIVE 10/03/2015 1608   KETONESUR NEGATIVE 10/03/2015 1608   PROTEINUR 30 (A) 10/03/2015 1608   UROBILINOGEN 1.0 02/21/2014 1544   NITRITE NEGATIVE  10/03/2015 1608   LEUKOCYTESUR NEGATIVE 10/03/2015 1608   Sepsis Labs Invalid input(s): PROCALCITONIN,  WBC,  LACTICIDVEN Microbiology No results found for this or any previous visit (from the past 240 hour(s)).   Time coordinating discharge: Over 30 minutes  SIGNED:   Elmarie Shiley, MD  [facility] 04/24/2017, 12:51 PM Pager   If 7PM-7AM, please contact night-coverage [email or web address] Password [email or web address]

## 2017-04-24 NOTE — Progress Notes (Signed)
Patient discharge teaching given, including activity, diet, follow-up appoints, and medications. Patient verbalized understanding of all discharge instructions. IV access was d/c'd. Vitals are stable. Skin is intact except as charted in most recent assessments. Pt to be escorted out by NT, to be driven home by family.   Waiting on pt. Ride to be discharged and MD to write order for patient to get syringes for Vit B injections.

## 2017-04-24 NOTE — Progress Notes (Signed)
CRITICAL VALUE ALERT  Critical Value: Hbg 6.9  Date & Time Notied:  04/24/17 0355  Provider Notified: Baltazar Najjar, NP  Orders Received/Actions taken: Donald Prose, RN waiting return call and orders.

## 2017-04-24 NOTE — Plan of Care (Signed)
Hemoglobin dropped low this morning even though pt has had 3unit RBC, ON CALL PHYSICIAN KERBY NOTIFIED THROUGH PAGE

## 2017-04-24 NOTE — Progress Notes (Signed)
Shift event: Pt's Hgb post transfusion was called at 7.7 (previously 5.3 on admit). Pt was completely asymptomatic, sitting up in bed eating. CBC r/p ordered for 5am.  KJKG, NP [facility] Update: Hgb at 0500 6.9, will order one unit.  KJKG, NP [facility]

## 2017-04-25 LAB — TYPE AND SCREEN
ABO/RH(D): O POS
Antibody Screen: NEGATIVE
DONOR AG TYPE: NEGATIVE
DONOR AG TYPE: NEGATIVE
Donor AG Type: NEGATIVE
Donor AG Type: NEGATIVE
Donor AG Type: NEGATIVE
UNIT DIVISION: 0
UNIT DIVISION: 0
UNIT DIVISION: 0
Unit division: 0
Unit division: 0

## 2017-04-25 LAB — BPAM RBC
BLOOD PRODUCT EXPIRATION DATE: 201901252359
Blood Product Expiration Date: 201901172359
Blood Product Expiration Date: 201901172359
Blood Product Expiration Date: 201901262359
Blood Product Expiration Date: 201902032359
ISSUE DATE / TIME: 201901080233
ISSUE DATE / TIME: 201901080549
ISSUE DATE / TIME: 201901081448
UNIT TYPE AND RH: 5100
Unit Type and Rh: 5100
Unit Type and Rh: 5100
Unit Type and Rh: 5100
Unit Type and Rh: 5100

## 2017-05-03 ENCOUNTER — Ambulatory Visit: Payer: Medicaid Other | Admitting: Physician Assistant

## 2017-05-21 ENCOUNTER — Ambulatory Visit: Payer: Medicaid Other | Admitting: Physician Assistant

## 2017-05-21 ENCOUNTER — Encounter: Payer: Self-pay | Admitting: Physician Assistant

## 2017-05-21 ENCOUNTER — Other Ambulatory Visit (INDEPENDENT_AMBULATORY_CARE_PROVIDER_SITE_OTHER): Payer: Medicaid Other

## 2017-05-21 VITALS — BP 118/62 | HR 72 | Ht 66.0 in | Wt 237.0 lb

## 2017-05-21 DIAGNOSIS — D519 Vitamin B12 deficiency anemia, unspecified: Secondary | ICD-10-CM

## 2017-05-21 DIAGNOSIS — K900 Celiac disease: Secondary | ICD-10-CM

## 2017-05-21 LAB — CBC WITH DIFFERENTIAL/PLATELET
Basophils Absolute: 0.1 10*3/uL (ref 0.0–0.1)
Basophils Relative: 1.8 % (ref 0.0–3.0)
Eosinophils Absolute: 0.1 10*3/uL (ref 0.0–0.7)
Eosinophils Relative: 2.2 % (ref 0.0–5.0)
HCT: 35.3 % — ABNORMAL LOW (ref 36.0–46.0)
Hemoglobin: 10.8 g/dL — ABNORMAL LOW (ref 12.0–15.0)
LYMPHS ABS: 2.1 10*3/uL (ref 0.7–4.0)
Lymphocytes Relative: 45.2 % (ref 12.0–46.0)
MCHC: 30.5 g/dL (ref 30.0–36.0)
MCV: 78.6 fl (ref 78.0–100.0)
MONO ABS: 0.3 10*3/uL (ref 0.1–1.0)
Monocytes Relative: 6.7 % (ref 3.0–12.0)
NEUTROS ABS: 2.1 10*3/uL (ref 1.4–7.7)
NEUTROS PCT: 44.1 % (ref 43.0–77.0)
PLATELETS: 264 10*3/uL (ref 150.0–400.0)
RBC: 4.5 Mil/uL (ref 3.87–5.11)
RDW: 16.4 % — AB (ref 11.5–15.5)
WBC: 4.7 10*3/uL (ref 4.0–10.5)

## 2017-05-21 LAB — COMPREHENSIVE METABOLIC PANEL
ALK PHOS: 99 U/L (ref 39–117)
ALT: 9 U/L (ref 0–35)
AST: 11 U/L (ref 0–37)
Albumin: 4.1 g/dL (ref 3.5–5.2)
BILIRUBIN TOTAL: 0.3 mg/dL (ref 0.2–1.2)
BUN: 12 mg/dL (ref 6–23)
CALCIUM: 8.9 mg/dL (ref 8.4–10.5)
CO2: 26 mEq/L (ref 19–32)
CREATININE: 0.64 mg/dL (ref 0.40–1.20)
Chloride: 107 mEq/L (ref 96–112)
GFR: 131.58 mL/min (ref >60.00)
GLUCOSE: 118 mg/dL — AB (ref 70–99)
Potassium: 3.9 mEq/L (ref 3.5–5.1)
Sodium: 139 mEq/L (ref 135–145)
TOTAL PROTEIN: 7.3 g/dL (ref 6.0–8.3)

## 2017-05-21 LAB — IGA: IGA: 203 mg/dL (ref 68–378)

## 2017-05-21 LAB — FOLATE: Folate: 6.3 ng/mL (ref >5.9)

## 2017-05-21 LAB — VITAMIN B12: Vitamin B-12: 227 pg/mL (ref 211–911)

## 2017-05-21 MED ORDER — PANTOPRAZOLE SODIUM 40 MG PO TBEC: 40.0000 mg | DELAYED_RELEASE_TABLET | Freq: Every day | ORAL | 3 refills | Status: DC

## 2017-05-21 MED ORDER — LINACLOTIDE 290 MCG PO CAPS: 290.0000 ug | ORAL_CAPSULE | Freq: Every day | ORAL | 3 refills | Status: DC

## 2017-05-21 NOTE — Progress Notes (Signed)
Chief Complaint: B12 deficiency, Celiac disease  HPI:    Courtney Davies is a 42 year old African-American female, who has previously seen Dr. Silverio Decamp, and presents to clinic today for follow up after hospitalization for severe B12 deficiency.    Please recall patient was last seen in clinic 10/26/15 for severe vitamin B12 deficiency.  At that time, patient complained of intermittent rectal bleeding, right lower quadrant abdominal pain, nausea and vomiting.  She has been compliant with the folate since her discharge from the hospital.  She did continue to smoke marijuana on a regular basis and was trying to cut back on the amount.  She complained of loss of appetite and weight loss for the past 2 months.  Patient was scheduled for an EGD and colonoscopy for evaluation, to exclude possible IBD, celiac disease, atrophic gastritis or others.  She also had a CBC with a B12 and folate level checked.  Her elevated liver enzymes were discussed and her LFTs were also rechecked.  She was told to increase fluid and fiber intake and start Linzess 145 mcg daily for her constipation.     EGD completed 12/05/15 revealed normal esophagus, erythematous mucosa in the stomach, a single gastric polyp and normal duodenum.  Colonoscopy the same day showed one 5 mm polyp in the descending colon as well as nonbleeding internal hemorrhoids.  She had villous atrophy on duodenal biopsies and was advised to avoid gluten.  It was recommended that she have a recheck of her celiac panel in 6 months.  Was also recommended she have testing for gene HLA DQ 2 and HLA DQ 8 for celiac disease.    Patient's most recent CBC shows a hemoglobin of 8.7 three weeks ago after a blood transfusion.  Initially this was 6.9.  Her MCV was normal.  BMP showed a sodium low at 130-4 weeks ago and was otherwise normal.  HIV antibody was negative.  Patient had more recent CBC 04/29/17 which showed a hemoglobin of 9.1   Review of chart patient was admitted to the  hospital 04/23/17-04/25/17 for symptomatic anemia.  She received a total of 3 units of PRBCs and her hemoglobin increased to 8.7.  She had denied any GI bleeding at that time.  A B12 was performed at [facility] which was less than 50.  She was discharged on B12 injections thousand weekly.  Patient was again advised to abide by a celiac diet.    Today, the patient presents to clinic and explains that since being in the hospital she has remained on her B12 injections once weekly and she actually feels much better.  Prior to going to the hospital patient was short of breath and severely fatigued.  Since discharge the patient tells me that she is "pretty much back to normal".   Patient does tell me that she was not abiding by a gluten-free diet at all.  She finds this very difficult and "expensive".    Patient does tell me she was started on Pantoprazole 40 mg once daily while in the hospital symptoms.  This has helped her symptoms some over the past month but she does continue with some epigastric discomfort and occasional reflux at night.     Patient describes her chronic constipation as controlled as long as she uses "2 of my Linzess 145 mcg tablets".  The patient tells me that she will have a daily formed bowel movement.    Patient also describes some joint aches and pains.    Patient denies  fever, chills, blood in her stool, melena, weight loss, anorexia, nausea, vomiting or symptoms that awaken her at night.  Past Medical History:  Diagnosis Date  . Anemia 09/2015  . Arthritis    "knees" (04/23/2017)  . Asthma    "teens; went away; came back" (04/23/2017)  . B12 deficiency anemia 04/23/2017  . Chronic bronchitis ([place])   . Chronic lower back pain   . Fatty liver   . GERD (gastroesophageal reflux disease)   . Headache    "1-2/wk" (04/23/2017)  . History of blood transfusion "plenty"   "related to anemia" (04/23/2017)  . Migraine    "1-2/month" (04/23/2017)  . Symptomatic anemia 04/23/2017    Past Surgical  History:  Procedure Laterality Date  . ABDOMINAL HYSTERECTOMY    . [place]; 2002  . INGUINAL HERNIA REPAIR Bilateral 1980s    "total of 4 surgeries" (04/23/2017)  . TUBAL LIGATION  2002    Current Outpatient Medications  Medication Sig Dispense Refill  . albuterol (PROVENTIL HFA;VENTOLIN HFA) 108 (90 Base) MCG/ACT inhaler Inhale 2 puffs into the lungs every 6 (six) hours as needed for wheezing or shortness of breath. 1 Inhaler 2  . cyanocobalamin (,VITAMIN B-12,) 1000 MCG/ML injection Inject 1 mL (1,000 mcg total) into the skin once a week for 6 doses. 1 mL 0  . linaclotide (LINZESS) 145 MCG CAPS capsule Take 1 capsule (145 mcg total) by mouth daily before breakfast. 30 capsule 0  . MUCINEX D 60-600 MG 12 hr tablet Take 1 tablet by mouth 2 (two) times daily as needed. Mucous relief  1  . pantoprazole (PROTONIX) 40 MG tablet Take 1 tablet (40 mg total) by mouth daily. 30 tablet 0  . Syringe/Needle, Disp, (SYRINGE 3CC/18GX1-1/2") 18G X 1-1/2" 3 ML MISC 1 application by Does not apply route once a week. 6 each 0  . vitamin C (ASCORBIC ACID) 500 MG tablet Take 500 mg by mouth daily.     No current facility-administered medications for this visit.     Allergies as of 05/21/2017 - Review Complete 04/23/2017  Allergen Reaction Noted  . Codeine Nausea And Vomiting 12/14/2012  . Other Other (See Comments) 04/11/2014    Family History  Problem Relation Age of Onset  . Anemia Paternal Grandmother   . Valvular heart disease Paternal Grandmother   . Diabetes Mother   . Hypertension Mother   . Diabetes Maternal Grandmother   . Prostate cancer Maternal Uncle        ? intestinal also    Social History   Socioeconomic History  . Marital status: Single    Spouse name: Not on file  . Number of children: 2  . Years of education: Not on file  . Highest education level: Not on file  Social Needs  . Financial resource strain: Not on file  . Food insecurity - worry: Not on file    . Food insecurity - inability: Not on file  . Transportation needs - medical: Not on file  . Transportation needs - non-medical: Not on file  Occupational History  . Not on file  Tobacco Use  . Smoking status: Current Every Day Smoker    Packs/day: 0.10    Years: 25.00    Pack years: 2.50    Types: Cigarettes  . Smokeless tobacco: Never Used  Substance and Sexual Activity  . Alcohol use: Yes    Alcohol/week: 0.0 oz    Comment: 04/23/2017 "maybe a drink q couple months"  .  Drug use: Yes    Comment: Marijuana, daily  . Sexual activity: Not Currently    Birth control/protection: None  Other Topics Concern  . Not on file  Social History Narrative  . Not on file    Review of Systems:    Constitutional: No weight loss, fever or chills Skin: No rash  Cardiovascular: No chest pain Respiratory: No SOB Gastrointestinal: See HPI and otherwise negative Genitourinary: No dysuria  Neurological: No headache Musculoskeletal:Positive for joint aches and pain Hematologic: No bleeding or bruising Psychiatric: No history of depression or anxiety   Physical Exam:  Vital signs: BP 118/62   Pulse 72   Ht 5' 6"  (1.676 m)   Wt 237 lb (107.5 kg)   LMP 12/03/2012   BMI 38.25 kg/m    Constitutional:   AA female appears to be in NAD, Well developed, Well nourished, alert and cooperative Head:  Normocephalic and atraumatic. Eyes:   PEERL, EOMI. No icterus. Conjunctiva pink. Ears:  Normal auditory acuity. Neck:  Supple Throat: Oral cavity and pharynx without inflammation, swelling or lesion.  Respiratory: Respirations even and unlabored. Lungs clear to auscultation bilaterally.   No wheezes, crackles, or rhonchi.  Cardiovascular: Normal S1, S2. No MRG. Regular rate and rhythm. No peripheral edema, cyanosis or pallor.  Gastrointestinal:  Soft, nondistended, moderate epigastric ttp, No rebound or guarding. Normal bowel sounds. No appreciable masses or hepatomegaly. Rectal:  Not performed.   Msk:  Symmetrical without gross deformities. Without edema, no deformity or joint abnormality.  Neurologic:  Alert and  oriented x4;  grossly normal neurologically.  Skin:   Dry and intact without significant lesions or rashes. Psychiatric: Demonstrates good judgement and reason without abnormal affect or behaviors.  RELEVANT LABS AND IMAGING: CBC    Component Value Date/Time   WBC 6.2 04/24/2017 0743   RBC 3.27 (L) 04/24/2017 0743   HGB 8.7 (L) 04/24/2017 0743   HCT 26.5 (L) 04/24/2017 0743   PLT 142 (L) 04/24/2017 0743   MCV 81.0 04/24/2017 0743   MCH 26.6 04/24/2017 0743   MCHC 32.8 04/24/2017 0743   RDW 26.3 (H) 04/24/2017 0743   LYMPHSABS 3.2 04/22/2017 2225   MONOABS 0.2 04/22/2017 2225   EOSABS 0.1 04/22/2017 2225   BASOSABS 0.0 04/22/2017 2225    CMP     Component Value Date/Time   NA 132 (L) 04/23/2017 1200   K 4.0 04/23/2017 1200   CL 102 04/23/2017 1200   CO2 21 (L) 04/23/2017 1200   GLUCOSE 163 (H) 04/23/2017 1200   BUN 9 04/23/2017 1200   CREATININE 0.64 04/23/2017 1200   CALCIUM 8.9 04/23/2017 1200   PROT 6.7 04/22/2017 2225   ALBUMIN 4.2 04/22/2017 2225   AST 120 (H) 04/22/2017 2225   ALT 46 04/22/2017 2225   ALKPHOS 59 04/22/2017 2225   BILITOT 0.8 04/22/2017 2225   GFRNONAA >60 04/23/2017 1200   GFRAA >60 04/23/2017 1200    Assessment: 1.  Celiac disease: Diagnosed in 2017 via duodenal biopsy  2.  Severe B12 deficiency: Chronic for the patient, thought related to celiac disease, patient has not been abiding by a gluten-free diet, patient is better after 3 unit blood transfusion in January and now on B12 injections  Plan: 1.  Discussed with the patient today that it is very important that she abide by a gluten-free diet as this would help her overall health and well-being.  I did refer her to a dietitian today to have further discussion regarding this. 2.  Reordered patient's HLA DQ 2 and DQ 8 testing per Dr. Silverio Decamp at patient's last visit. 3.   Ordered a CBC, CMP, B12 and folate 4.  Patient to continue with her weekly B12 injections for a total of 6 weeks and then once a month. 5.  Sent in a new prescription for Pantoprazole 40 mg twice daily.  Did discuss patient should take this 30-60 minutes before breakfast and dinner.  Sent in #60 with 5 refills 6.  Increased patient's Linzess to 290 mcg daily.  Sent in  #90 with 3 refills 7.  Patient to follow-up with Dr. Silverio Decamp in 3-4 months or sooner if necessary.  Ellouise Newer, PA-C [facility] 05/21/2017, 2:23 PM

## 2017-05-21 NOTE — Patient Instructions (Signed)
Your physician has requested that you go to the basement for  lab work before leaving today.  Increase Pantoprazole to 40 mg twice a day 30-60 minutes before breakfast and dinner.   We have sent the following medications to your pharmacy for you to pick up at your convenience:  Linzess 290 mcg daily  We have sent a referral to a dietician within [facility]. They will contact you for an appointment.

## 2017-05-22 ENCOUNTER — Other Ambulatory Visit: Payer: Medicaid Other

## 2017-05-23 LAB — TISSUE TRANSGLUTAMINASE, IGA: (TTG) AB, IGA: 1 U/mL

## 2017-05-23 LAB — TIQ-NTM

## 2017-05-23 LAB — EXTRA LAV TOP TUBE

## 2017-05-23 LAB — GLIADIN ANTIBODIES, SERUM
Gliadin IgA: 11 Units
Gliadin IgG: 2 Units

## 2017-05-23 LAB — RETICULIN ANTIBODIES, IGA W TITER: Reticulin IGA Screen: NEGATIVE

## 2017-05-24 NOTE — Progress Notes (Signed)
Reviewed and agree with documentation and assessment and plan. K. Veena Rafia Shedden , MD   

## 2017-05-29 LAB — CELIAC DISEASE HLA DQ ASSOC.
DQ2 (DQA1 0501/0505,DQB1 02XX): NEGATIVE
DQ8 (DQA1 03XX, DQB1 0302): NEGATIVE

## 2017-06-07 ENCOUNTER — Ambulatory Visit: Payer: Medicaid Other | Admitting: Dietician

## 2017-06-27 ENCOUNTER — Ambulatory Visit: Payer: Medicaid Other | Admitting: Dietician

## 2017-07-23 ENCOUNTER — Ambulatory Visit: Payer: Medicaid Other | Admitting: Gastroenterology

## 2017-10-18 ENCOUNTER — Emergency Department (HOSPITAL_BASED_OUTPATIENT_CLINIC_OR_DEPARTMENT_OTHER): Payer: Medicaid Other

## 2017-10-18 ENCOUNTER — Emergency Department (HOSPITAL_BASED_OUTPATIENT_CLINIC_OR_DEPARTMENT_OTHER)
Admission: EM | Admit: 2017-10-18 | Discharge: 2017-10-18 | Disposition: A | Discharge status: Home / Self Care | Payer: Medicaid Other | Attending: Emergency Medicine | Admitting: Emergency Medicine

## 2017-10-18 ENCOUNTER — Other Ambulatory Visit: Payer: Self-pay

## 2017-10-18 ENCOUNTER — Encounter (HOSPITAL_BASED_OUTPATIENT_CLINIC_OR_DEPARTMENT_OTHER): Payer: Self-pay

## 2017-10-18 DIAGNOSIS — W01198A Fall on same level from slipping, tripping and stumbling with subsequent striking against other object, initial encounter: Secondary | ICD-10-CM | POA: Diagnosis not present

## 2017-10-18 DIAGNOSIS — S82092A Other fracture of left patella, initial encounter for closed fracture: Secondary | ICD-10-CM | POA: Insufficient documentation

## 2017-10-18 DIAGNOSIS — Y92002 Bathroom of unspecified non-institutional (private) residence single-family (private) house as the place of occurrence of the external cause: Secondary | ICD-10-CM | POA: Diagnosis not present

## 2017-10-18 DIAGNOSIS — F17210 Nicotine dependence, cigarettes, uncomplicated: Secondary | ICD-10-CM | POA: Diagnosis not present

## 2017-10-18 DIAGNOSIS — Y998 Other external cause status: Secondary | ICD-10-CM | POA: Insufficient documentation

## 2017-10-18 DIAGNOSIS — Z79899 Other long term (current) drug therapy: Secondary | ICD-10-CM | POA: Diagnosis not present

## 2017-10-18 DIAGNOSIS — J45909 Unspecified asthma, uncomplicated: Secondary | ICD-10-CM | POA: Diagnosis not present

## 2017-10-18 DIAGNOSIS — S8992XA Unspecified injury of left lower leg, initial encounter: Secondary | ICD-10-CM | POA: Diagnosis present

## 2017-10-18 DIAGNOSIS — Y9389 Activity, other specified: Secondary | ICD-10-CM | POA: Diagnosis not present

## 2017-10-18 DIAGNOSIS — M25562 Pain in left knee: Secondary | ICD-10-CM

## 2017-10-18 MED ORDER — IBUPROFEN 600 MG PO TABS: 600.0000 mg | ORAL_TABLET | Freq: Four times a day (QID) | ORAL | 0 refills | Status: DC | PRN

## 2017-10-18 MED ORDER — HYDROCODONE-ACETAMINOPHEN 5-325 MG PO TABS: 1.0000 | ORAL_TABLET | Freq: Four times a day (QID) | ORAL | 0 refills | Status: DC | PRN

## 2017-10-18 MED FILL — HYDROCODON-APAP 5-325: 5-325 | 2 days supply | Qty: 15 | Fill #0

## 2017-10-18 MED FILL — IBUPROFEN 600 MG TABLET: 600 | 7 days supply | Qty: 30 | Fill #0

## 2017-10-18 NOTE — ED Provider Notes (Signed)
[facility] EMERGENCY DEPARTMENT Provider Note   CSN: [id number] Arrival date & time: 10/18/17  1156     History   Chief Complaint Chief Complaint  Patient presents with  . Knee Pain    HPI Courtney Davies is a 42 y.o. female with history of vitamin B12 deficiency anemia, migraines who presents with a 2-day history of left knee pain.  Patient reports that 2 months ago, she fell on her left knee in the shower.  She reports it hurt for a couple days at that time, but then got better.  She has had some numbness and tingling down her leg.  She she has pain with movement.  She has been taking ibuprofen and Aleve without relief.  She denies any other injuries or pain elsewhere.  HPI  Past Medical History:  Diagnosis Date  . Anemia 09/2015  . Arthritis    "knees" (04/23/2017)  . Asthma    "teens; went away; came back" (04/23/2017)  . B12 deficiency anemia 04/23/2017  . Chronic bronchitis ([place])   . Chronic lower back pain   . Fatty liver   . GERD (gastroesophageal reflux disease)   . Headache    "1-2/wk" (04/23/2017)  . History of blood transfusion "plenty"   "related to anemia" (04/23/2017)  . Migraine    "1-2/month" (04/23/2017)  . Symptomatic anemia 04/23/2017    Patient Active Problem List   Diagnosis Date Noted  . Anemia 04/23/2017  . Acute bronchitis 04/23/2017  . Chronic back pain 10/03/2015  . Symptomatic anemia 04/10/2014  . Bilateral edema of lower extremity   . Low back pain with left-sided sciatica     Past Surgical History:  Procedure Laterality Date  . ABDOMINAL HYSTERECTOMY    . [place]; 2002  . INGUINAL HERNIA REPAIR Bilateral 1980s    "total of 4 surgeries" (04/23/2017)  . TUBAL LIGATION  2002     OB History   None      Home Medications    Prior to Admission medications   Medication Sig Start Date End Date Taking? Authorizing Provider  albuterol (PROVENTIL HFA;VENTOLIN HFA) 108 (90 Base) MCG/ACT inhaler Inhale 2 puffs into the  lungs every 6 (six) hours as needed for wheezing or shortness of breath. 10/04/15   Thurnell Lose, MD  HYDROcodone-acetaminophen (NORCO/VICODIN) 5-325 MG tablet Take 1-2 tablets by mouth every 6 (six) hours as needed. 10/18/17   Gracie Gupta, Bea Graff, PA-C  ibuprofen (ADVIL,MOTRIN) 600 MG tablet Take 1 tablet (600 mg total) by mouth every 6 (six) hours as needed. 10/18/17   Tayra Dawe, Bea Graff, PA-C  linaclotide (LINZESS) 290 MCG CAPS capsule Take 1 capsule (290 mcg total) by mouth daily before breakfast. 05/21/17   Levin Erp, PA  MUCINEX D 60-600 MG 12 hr tablet Take 1 tablet by mouth 2 (two) times daily as needed. Mucous relief 04/10/17   [provider]  pantoprazole (PROTONIX) 40 MG tablet Take 1 tablet (40 mg total) by mouth daily. 05/21/17   Levin Erp, PA  Syringe/Needle, Disp, (SYRINGE 3CC/18GX1-1/2") 18G X 1-1/2" 3 ML MISC 1 application by Does not apply route once a week. 04/24/17   Regalado, Belkys A, MD  vitamin C (ASCORBIC ACID) 500 MG tablet Take 500 mg by mouth daily.    [provider]    Family History Family History  Problem Relation Age of Onset  . Anemia Paternal Grandmother   . Valvular heart disease Paternal Grandmother   . Diabetes  Mother   . Hypertension Mother   . Diabetes Maternal Grandmother   . Prostate cancer Maternal Uncle        ? intestinal also    Social History Social History   Tobacco Use  . Smoking status: Current Every Day Smoker    Packs/day: 0.10    Years: 25.00    Pack years: 2.50    Types: Cigarettes  . Smokeless tobacco: Never Used  Substance Use Topics  . Alcohol use: Yes    Alcohol/week: 0.0 oz    Comment: occ  . Drug use: Yes    Types: Marijuana     Allergies   Codeine and Other   Review of Systems Review of Systems  Constitutional: Negative for fever.  Musculoskeletal: Positive for arthralgias.  Neurological: Positive for numbness (paresthesia).     Physical Exam Updated Vital Signs BP  120/80 (BP Location: Right Arm)   Pulse 82   Temp 98.5 F (36.9 C) (Oral)   Resp 20   Ht 5' 6"  (1.676 m)   Wt 97.5 kg (215 lb)   LMP 12/03/2012   SpO2 98%   BMI 34.70 kg/m   Physical Exam  Constitutional: She appears well-developed and well-nourished. No distress.  HENT:  Head: Normocephalic and atraumatic.  Mouth/Throat: Oropharynx is clear and moist. No oropharyngeal exudate.  Eyes: Pupils are equal, round, and reactive to light. Conjunctivae are normal. Right eye exhibits no discharge. Left eye exhibits no discharge. No scleral icterus.  Neck: Normal range of motion. Neck supple. No thyromegaly present.  Cardiovascular: Normal rate, regular rhythm, normal heart sounds and intact distal pulses. Exam reveals no gallop and no friction rub.  No murmur heard. Pulmonary/Chest: Effort normal. No stridor. No respiratory distress. She has no wheezes. She has rhonchi (bilateral (hx of chronic bronchitis, smoker)). She has no rales.  Musculoskeletal: She exhibits no edema.  L knee: Anterior tenderness around the patella to the lateral joint line and lateral superior aspect of the knee, pain with anterior drawer, negative posterior drawer, pain with varus and valgus stress, but no laxity; mild warmth over effusion, but no erythema  Lymphadenopathy:    She has no cervical adenopathy.  Neurological: She is alert. Coordination normal.  Skin: Skin is warm and dry. No rash noted. She is not diaphoretic. No pallor.  Psychiatric: She has a normal mood and affect.  Nursing note and vitals reviewed.    ED Treatments / Results  Labs (all labs ordered are listed, but only abnormal results are displayed) Labs Reviewed - No data to display  EKG None  Radiology Dg Knee Complete 4 Views Left  Result Date: 10/18/2017 CLINICAL DATA:  Left knee pain for 2 days.  No known injury. EXAM: LEFT KNEE - COMPLETE 4+ VIEW COMPARISON:  None. FINDINGS: Curvilinear lucency is present through the lower pole of  patella on the lateral radiograph with disruption of the cortex posteriorly concerning for an acute, nondisplaced fracture. There is no dislocation. There is a small knee joint effusion. Mild marginal spurring is noted in the medial and lateral compartments. IMPRESSION: 1. Suspected nondisplaced fracture of the lower pole of the patella. 2. Small knee joint effusion. Electronically Signed   By: Logan Bores M.D.   On: 10/18/2017 12:52    Procedures Procedures (including critical care time)  Medications Ordered in ED Medications - No data to display   Initial Impression / Assessment and Plan / ED Course  I have reviewed the triage vital signs and the nursing  notes.  Pertinent labs & imaging results that were available during my care of the patient were reviewed by me and considered in my medical decision making (see chart for details).     Patient with left knee pain after fall 2 months ago.  X-ray shows suspected nondisplaced fracture of the lower pole the patella and small knee joint effusion.  Straight leg raise is intact, but limited due to pain.  Patient neurovascularly intact.  Low suspicion for septic joint.  Suspect pain due to injury.  Patient placed in knee immobilizer and given crutches for comfort.  Patient referred to orthopedics for further evaluation.  Supportive treatment discussed including ice.  Will discharge home with ibuprofen and short course of Norco.  Reviewed the [place] narcotic database and found no discrepancies.  Return precautions discussed.  Patient understands and agrees with plan.  Patient vitals stable throughout ED course and discharged in satisfactory condition.  Final Clinical Impressions(s) / ED Diagnoses   Final diagnoses:  Other closed fracture of left patella, initial encounter  Acute pain of left knee    ED Discharge Orders        Ordered    HYDROcodone-acetaminophen (NORCO/VICODIN) 5-325 MG tablet  Every 6 hours PRN     10/18/17 1344    ibuprofen  (ADVIL,MOTRIN) 600 MG tablet  Every 6 hours PRN     10/18/17 [address] PA-C 10/18/17 1536    Blanchie Dessert, MD 10/22/17 1336

## 2017-10-18 NOTE — ED Triage Notes (Signed)
C/o pain to left knee x 2 days-denies recent injury

## 2017-10-18 NOTE — Discharge Instructions (Addendum)
Take ibuprofen every 6 hours as needed for pain.  For breakthrough pain, take 1-2 Norco every 6 hours.  Do not combine ibuprofen with Naprosyn/Aleve.  Use ice 3-4 times daily alternating 20 minutes on, 20 minutes off.  Wear your knee immobilizer at all times when walking.  Use crutches as needed for comfort.  Please follow-up with orthopedic doctor below for further evaluation and treatment of your injury.  Please return to the emergency department if you develop any new or worsening symptoms.  Do not drink alcohol, drive, operate machinery or participate in any other potentially dangerous activities while taking opiate pain medication as it may make you sleepy. Do not take this medication with any other sedating medications, either prescription or over-the-counter. If you were prescribed Percocet or Vicodin, do not take these with acetaminophen (Tylenol) as it is already contained within these medications and overdose of Tylenol is dangerous.   This medication is an opiate (or narcotic) pain medication and can be habit forming.  Use it as little as possible to achieve adequate pain control.  Do not use or use it with extreme caution if you have a history of opiate abuse or dependence. This medication is intended for your use only - do not give any to anyone else and keep it in a secure place where nobody else, especially children, have access to it. It will also cause or worsen constipation, so you may want to consider taking an over-the-counter stool softener while you are taking this medication.

## 2017-10-18 NOTE — ED Notes (Signed)
Pms intact before and after immobilizer placed. Patient understands how to take on and off the immobilizer and RICE.

## 2017-10-23 ENCOUNTER — Ambulatory Visit (INDEPENDENT_AMBULATORY_CARE_PROVIDER_SITE_OTHER): Payer: Medicaid Other | Admitting: Physician Assistant

## 2017-10-23 ENCOUNTER — Encounter (INDEPENDENT_AMBULATORY_CARE_PROVIDER_SITE_OTHER): Payer: Self-pay | Admitting: Physician Assistant

## 2017-10-23 DIAGNOSIS — M25562 Pain in left knee: Secondary | ICD-10-CM

## 2017-10-23 MED ORDER — HYDROCODONE-ACETAMINOPHEN 5-325 MG PO TABS: 1.0000 | ORAL_TABLET | Freq: Four times a day (QID) | ORAL | 0 refills | Status: DC | PRN

## 2017-10-23 NOTE — Progress Notes (Signed)
Office Visit Note   Patient: Courtney Davies           Date of Birth: 1975-06-13           MRN: [id number] Visit Date: 10/23/2017              Requested by: No referring provider defined for this encounter. PCP: System, Pcp Not In   Assessment & Plan: Visit Diagnoses:  1. Acute pain of left knee     Plan: She is given a prescription for a Bledsoe knee brace which will be locked out at full extension.  Like for her to wear this anytime that she is up weightbearing.  Elevation of the knee icing the knee encouraged.  She is given a refill on hydrocodone she is used sparingly.  She can continue her ibuprofen.  See her back after MRI of the left knee.  We ordered an MRI of the left knee to rule out internal derangement due to the fact that she had pain prior to a week ago that it caused her to have giving way sensation of the knee.  Follow-Up Instructions: Return for after MRI.   Orders:  Orders Placed This Encounter  Procedures  . MR Knee Left w/o contrast   Meds ordered this encounter  Medications  . HYDROcodone-acetaminophen (NORCO/VICODIN) 5-325 MG tablet    Sig: Take 1-2 tablets by mouth every 6 (six) hours as needed.    Dispense:  30 tablet    Refill:  0      Procedures: No procedures performed   Clinical Data: No additional findings.   Subjective: Chief Complaint  Patient presents with  . Left Knee - Pain    HPI [name] is a 42 year old female whose had several falls over the last few months due to her left knee giving way.  She states that she initially had a fall approximately 2 months ago when she had a painful pop in the knee and was unable to bear weight on it pain finally subsided to the point that she could bear weight on it and tripped injury to the knee again.  Then 1 week ago she was playing with a family member and had a pop in the knee and since that time is been unable to bear weight.  She was seen at the med Center at [facility] on 10/18/2017 where  radiographs were obtained there is a lucency in the inferior pole of the patella which most likely represents a nondisplaced fracture.  She is placed in a knee immobilizer.  She states she has difficulty with a knee immobilizer and has even difficulty putting it on.  She is been unable to bear weight on the left knee since the last injury.  She was given hydrocodone for the pain. Review of Systems No fevers chills shortness of breath chest pain  Objective: Vital Signs: Ht 5' 6"  (1.676 m)   Wt 215 lb (97.5 kg)   LMP 12/03/2012   BMI 34.70 kg/m   Physical Exam  Constitutional: She is oriented to person, place, and time. She appears well-developed and well-nourished. No distress.  Pulmonary/Chest: Effort normal.  Neurological: She is alert and oriented to person, place, and time.  Skin: She is not diaphoretic.  Psychiatric: She has a normal mood and affect.    Ortho Exam Left knee she has full extension no attempts at flexion.  She is able do straight leg raise.  She has tenderness at the inferior pole patella  and medial joint line.  No instability valgus varus stressing.  No significant effusion erythema.  Otherwise unable to examine the knee due to patient's guarding and pain.  Calf supple nontender Specialty Comments:  No specialty comments available.  Imaging: No results found.   PMFS History: Patient Active Problem List   Diagnosis Date Noted  . Anemia 04/23/2017  . Acute bronchitis 04/23/2017  . Chronic back pain 10/03/2015  . Symptomatic anemia 04/10/2014  . Bilateral edema of lower extremity   . Low back pain with left-sided sciatica    Past Medical History:  Diagnosis Date  . Anemia 09/2015  . Arthritis    "knees" (04/23/2017)  . Asthma    "teens; went away; came back" (04/23/2017)  . B12 deficiency anemia 04/23/2017  . Chronic bronchitis ([place])   . Chronic lower back pain   . Fatty liver   . GERD (gastroesophageal reflux disease)   . Headache    "1-2/wk" (04/23/2017)   . History of blood transfusion "plenty"   "related to anemia" (04/23/2017)  . Migraine    "1-2/month" (04/23/2017)  . Symptomatic anemia 04/23/2017    Family History  Problem Relation Age of Onset  . Anemia Paternal Grandmother   . Valvular heart disease Paternal Grandmother   . Diabetes Mother   . Hypertension Mother   . Diabetes Maternal Grandmother   . Prostate cancer Maternal Uncle        ? intestinal also    Past Surgical History:  Procedure Laterality Date  . ABDOMINAL HYSTERECTOMY    . [place]; 2002  . INGUINAL HERNIA REPAIR Bilateral 1980s    "total of 4 surgeries" (04/23/2017)  . TUBAL LIGATION  2002   Social History   Occupational History  . Not on file  Tobacco Use  . Smoking status: Current Every Day Smoker    Packs/day: 0.10    Years: 25.00    Pack years: 2.50    Types: Cigarettes  . Smokeless tobacco: Never Used  Substance and Sexual Activity  . Alcohol use: Yes    Alcohol/week: 0.0 oz    Comment: occ  . Drug use: Yes    Types: Marijuana  . Sexual activity: Not on file

## 2017-10-24 ENCOUNTER — Telehealth (INDEPENDENT_AMBULATORY_CARE_PROVIDER_SITE_OTHER): Payer: Self-pay | Admitting: *Deleted

## 2017-10-24 NOTE — Telephone Encounter (Signed)
Pt is having MRI done this Saturday the 13th at [facility] pt is requesting something to help her relax. Please advise.

## 2017-10-25 ENCOUNTER — Other Ambulatory Visit (INDEPENDENT_AMBULATORY_CARE_PROVIDER_SITE_OTHER): Payer: Self-pay

## 2017-10-25 MED ORDER — DIAZEPAM 5 MG PO TABS: ORAL_TABLET | ORAL | 0 refills | Status: DC

## 2017-10-25 NOTE — Telephone Encounter (Signed)
Called in Rx valium in for patient

## 2017-10-26 ENCOUNTER — Ambulatory Visit (HOSPITAL_BASED_OUTPATIENT_CLINIC_OR_DEPARTMENT_OTHER)
Admission: RE | Admit: 2017-10-26 | Discharge: 2017-10-26 | Disposition: A | Discharge status: Home / Self Care | Payer: Medicaid Other | Source: Ambulatory Visit | Attending: Physician Assistant | Admitting: Physician Assistant

## 2017-10-26 DIAGNOSIS — S83282A Other tear of lateral meniscus, current injury, left knee, initial encounter: Secondary | ICD-10-CM | POA: Diagnosis not present

## 2017-10-26 DIAGNOSIS — M25562 Pain in left knee: Secondary | ICD-10-CM

## 2017-10-26 DIAGNOSIS — M1712 Unilateral primary osteoarthritis, left knee: Secondary | ICD-10-CM | POA: Diagnosis not present

## 2017-10-26 DIAGNOSIS — M25462 Effusion, left knee: Secondary | ICD-10-CM | POA: Diagnosis not present

## 2017-10-26 DIAGNOSIS — X58XXXA Exposure to other specified factors, initial encounter: Secondary | ICD-10-CM | POA: Diagnosis not present

## 2017-11-05 ENCOUNTER — Telehealth (INDEPENDENT_AMBULATORY_CARE_PROVIDER_SITE_OTHER): Payer: Self-pay | Admitting: Physician Assistant

## 2017-11-05 NOTE — Telephone Encounter (Signed)
10/23/2017 OV note faxed to [facility] clinic [phone]

## 2017-11-07 ENCOUNTER — Encounter (INDEPENDENT_AMBULATORY_CARE_PROVIDER_SITE_OTHER): Payer: Self-pay | Admitting: Physician Assistant

## 2017-11-07 ENCOUNTER — Ambulatory Visit (INDEPENDENT_AMBULATORY_CARE_PROVIDER_SITE_OTHER): Payer: Medicaid Other | Admitting: Physician Assistant

## 2017-11-07 DIAGNOSIS — M25562 Pain in left knee: Secondary | ICD-10-CM | POA: Diagnosis not present

## 2017-11-07 DIAGNOSIS — S83282D Other tear of lateral meniscus, current injury, left knee, subsequent encounter: Secondary | ICD-10-CM

## 2017-11-07 NOTE — Progress Notes (Signed)
HPI: Ms. Markwood returns today to go over the MRI of her left knee.  She states the knee feels heavy.  She continues to have giving way sensation of the knee.  She did try the Bledsoe brace but said that she could not tolerate it and slid down with walking.  She still having significant pain in the knee and having difficulty ambulating due to the pain within the knee. MRI left knee dated 10/26/2017 images are reviewed with the patient and show a radial and horizontal tear of the anterior horn of the lateral meniscus.  Also area of high-grade near full-thickness cartilage loss over the lateral patella facet with underlying subchondral marrow edema.  Lateral compartment with focal full-thickness cartilage loss over the posterior weightbearing of the lateral femoral condyle with underlying sub-chondral marrow edema full-thickness fissure over the posterior nonweightbearing lateral femoral condyle.  Moderate joint effusion.  Review of systems: Please see HPI.  Physical exam: General well-developed well-nourished female in no acute distress mood and affect appropriate. Left knee full extension flexion beyond 90 degrees.  Ambulates with a antalgic gait.  Impression: Left knee lateral meniscal tears Left knee osteoarthritis  Plan: At this point time due to the fact the patient continues to have mechanical symptoms of the knee and continued pain given her young age would recommend knee arthroscopy with partial medial meniscectomy and extensive debridement.  Conservative treatment would include cortisone and the injection in the knee which patient has been reluctant to do any type of injection in the knee.  Discussed knee arthroscopy with her and the risk which include but are not limited to prolonged pain worsening pain DVT/PE, infection and wound healing problems.  She would like to think about this and give [place] a call if she would like to proceed with a knee arthroscopy in the future.  She is given Samella Parr  card and will call if she would like to proceed with surgery.  Otherwise follow-up PRN

## 2017-12-12 ENCOUNTER — Telehealth (INDEPENDENT_AMBULATORY_CARE_PROVIDER_SITE_OTHER): Payer: Self-pay | Admitting: Orthopaedic Surgery

## 2017-12-12 MED ORDER — ACETAMINOPHEN-CODEINE #3 300-30 MG PO TABS: 1.0000 | ORAL_TABLET | Freq: Three times a day (TID) | ORAL | 0 refills | Status: DC | PRN

## 2017-12-12 NOTE — Telephone Encounter (Signed)
I sent in some tylenol #3.  That is all we can legally do.

## 2017-12-12 NOTE — Telephone Encounter (Signed)
Awaiting pulmonary clearance before rescheduling per Dr. Ninfa Linden.  Pulmonary referral in for appointment to be scheduled.

## 2017-12-12 NOTE — Telephone Encounter (Signed)
See below, patient wants something for pain

## 2017-12-12 NOTE — Telephone Encounter (Signed)
LMOM for patient letting her know we called in Rx for her

## 2017-12-12 NOTE — Telephone Encounter (Signed)
Patient called in a lot of pain would like to speak with surgery scheduler, to determine when surgery will take place. Patient stated her pain level is very high and would like some type of pain medicine.

## 2017-12-13 NOTE — Telephone Encounter (Signed)
Patient states she is allergic to codeine. So she is still requesting hydrocodone. # [phone]

## 2017-12-13 NOTE — Telephone Encounter (Signed)
Patient aware Rx was called into pharmacy

## 2017-12-13 NOTE — Telephone Encounter (Signed)
Maybe we can try Tramadol?, she said she can't to Tylenol#3 cause she's allergic to codeine

## 2017-12-13 NOTE — Telephone Encounter (Signed)
Funny. She is allergic to codeine, but can take hydrocodone, which has a lot of codeine in it.  Only tramadol - 5)mg; 1-2 every 6-8 hours as needed for pain, #40. Thanks

## 2017-12-19 ENCOUNTER — Encounter: Payer: Self-pay | Admitting: Neurology

## 2017-12-19 ENCOUNTER — Telehealth: Payer: Self-pay | Admitting: Neurology

## 2017-12-19 ENCOUNTER — Ambulatory Visit: Payer: Medicaid Other | Admitting: Neurology

## 2017-12-19 ENCOUNTER — Encounter (INDEPENDENT_AMBULATORY_CARE_PROVIDER_SITE_OTHER): Payer: Self-pay

## 2017-12-19 VITALS — BP 122/79 | HR 87 | Ht 66.0 in | Wt 236.5 lb

## 2017-12-19 DIAGNOSIS — M5440 Lumbago with sciatica, unspecified side: Secondary | ICD-10-CM | POA: Diagnosis not present

## 2017-12-19 DIAGNOSIS — G8929 Other chronic pain: Secondary | ICD-10-CM | POA: Insufficient documentation

## 2017-12-19 MED ORDER — GABAPENTIN 300 MG PO CAPS: 300.0000 mg | ORAL_CAPSULE | Freq: Three times a day (TID) | ORAL | 11 refills | Status: DC

## 2017-12-19 MED ORDER — DIAZEPAM 5 MG PO TABS: 5.0000 mg | ORAL_TABLET | Freq: Four times a day (QID) | ORAL | 0 refills | Status: DC | PRN

## 2017-12-19 NOTE — Telephone Encounter (Signed)
Medicaid order sent to GI. They will obtain the auth and they will reach out to the pt to schedule.

## 2017-12-19 NOTE — Progress Notes (Signed)
PATIENT: Courtney Davies DOB: June 04, 1975  Chief Complaint  Patient presents with  . Back Pain    Reports severe low back pain radiating down her left leg, along with numbness/tingling.  Pain has been present for years but has been getting worse.  Denies having any injuries. Symptoms causing her difficulty walking.  Marland Kitchen PCP    [place], [place], PA-C     HISTORICAL  Courtney Davies is a 42 year old female,, seen in request by her primary care PA [place], [place] for evaluation of low back pain, radiating to left leg, with numbness tingling, initial evaluation was on December 19, 2017,  I had reviewed and summarized the referring note from the referring physician, she reported a history of chronic low back pain since 2009, starting at midline low back, radiating to both hips, and lower extremity, left side is worse than right, with associated numbness tingling and stiffness, getting worse with prolonged standing, walking, she also complains of knee pain, with recent significant worsening of gait abnormality, fell few times, she also complains of urinary urgency, stiffness in her legs, numbness tingling her fingers intermittently, she was recently diagnosed with vitamin B12 deficiency is on B12 supplement, over the years, she has tried different over-the-counter medications, currently taking ibuprofen 800 mg 4 times a day, Aleve 220 mg 5 times a day, she was giving a prescription of Vicodin for her left knee pain,   I have reviewed her controlled substance registry, she has been getting hydrocodone/Tylenol 45 tablets in July, 50 tablets in August 2019   REVIEW OF SYSTEMS: Full 14 system review of systems performed and notable only for weight gain, swelling legs, wheezing, anemia, joint pain, cramps, numbness, weakness, restless leg, not enough sleep, All other review of systems were negative.  ALLERGIES: Allergies  Allergen Reactions  . Other Other (See Comments), Anaphylaxis,  Swelling and Hives    Hair glue causes throat to close and hives "glue" Hair glue causes throat to close   . Latex Hives  . Codeine Nausea And Vomiting  . Nitrofurantoin Rash    HOME MEDICATIONS: Current Outpatient Medications  Medication Sig Dispense Refill  . albuterol (PROVENTIL HFA;VENTOLIN HFA) 108 (90 Base) MCG/ACT inhaler Inhale 2 puffs into the lungs every 6 (six) hours as needed for wheezing or shortness of breath. 1 Inhaler 2  . Cyanocobalamin (VITAMIN B-12 IJ) Inject as directed.    . diazepam (VALIUM) 5 MG tablet Take one by mouth one hour prior to MRI, repeat as needed 2 tablet 0  . HYDROcodone-acetaminophen (NORCO/VICODIN) 5-325 MG tablet Take 1-2 tablets by mouth every 6 (six) hours as needed. 30 tablet 0  . ibuprofen (ADVIL,MOTRIN) 600 MG tablet Take 1 tablet (600 mg total) by mouth every 6 (six) hours as needed. 30 tablet 0  . linaclotide (LINZESS) 290 MCG CAPS capsule Take 1 capsule (290 mcg total) by mouth daily before breakfast. 90 capsule 3  . MUCINEX D 60-600 MG 12 hr tablet Take 1 tablet by mouth 2 (two) times daily as needed. Mucous relief  1  . pantoprazole (PROTONIX) 40 MG tablet Take 1 tablet (40 mg total) by mouth daily. 90 tablet 3  . Syringe/Needle, Disp, (SYRINGE 3CC/18GX1-1/2") 18G X 1-1/2" 3 ML MISC 1 application by Does not apply route once a week. 6 each 0  . vitamin C (ASCORBIC ACID) 500 MG tablet Take 500 mg by mouth daily.    . Vitamin D, Ergocalciferol, (DRISDOL) 50000 units CAPS capsule Take by mouth.  No current facility-administered medications for this visit.     PAST MEDICAL HISTORY: Past Medical History:  Diagnosis Date  . Anemia 09/2015  . Arthritis    "knees" (04/23/2017)  . Asthma    "teens; went away; came back" (04/23/2017)  . B12 deficiency anemia 04/23/2017  . Chronic bronchitis ([place])   . Chronic lower back pain   . Fatty liver   . GERD (gastroesophageal reflux disease)   . Headache    "1-2/wk" (04/23/2017)  . History of blood  transfusion "plenty"   "related to anemia" (04/23/2017)  . Low back pain   . Migraine    "1-2/month" (04/23/2017)  . Symptomatic anemia 04/23/2017    PAST SURGICAL HISTORY: Past Surgical History:  Procedure Laterality Date  . ABDOMINAL HYSTERECTOMY    . [place]; 2002  . INGUINAL HERNIA REPAIR Bilateral 1980s    "total of 4 surgeries" (04/23/2017)  . TUBAL LIGATION  2002    FAMILY HISTORY: Family History  Problem Relation Age of Onset  . Anemia Paternal Grandmother   . Valvular heart disease Paternal Grandmother   . Diabetes Mother   . Hypertension Mother   . Diabetes Maternal Grandmother   . Prostate cancer Maternal Uncle        ? intestinal also  . Diabetes Father     SOCIAL HISTORY: Social History   Socioeconomic History  . Marital status: Single    Spouse name: Not on file  . Number of children: 2  . Years of education: 11th  . Highest education level: Not on file  Occupational History  . Occupation: Advertising account planner in [place]  . Financial resource strain: Not on file  . Food insecurity:    Worry: Not on file    Inability: Not on file  . Transportation needs:    Medical: Not on file    Non-medical: Not on file  Tobacco Use  . Smoking status: Current Every Day Smoker    Packs/day: 0.10    Years: 25.00    Pack years: 2.50    Types: Cigarettes  . Smokeless tobacco: Never Used  Substance and Sexual Activity  . Alcohol use: Yes    Alcohol/week: 0.0 standard drinks    Comment: rarely  . Drug use: Yes    Types: Marijuana    Comment: daily use  . Sexual activity: Not on file  Lifestyle  . Physical activity:    Days per week: Not on file    Minutes per session: Not on file  . Stress: Not on file  Relationships  . Social connections:    Talks on phone: Not on file    Gets together: Not on file    Attends religious service: Not on file    Active member of club or organization: Not on file    Attends meetings of clubs or  organizations: Not on file    Relationship status: Not on file  . Intimate partner violence:    Fear of current or ex partner: Not on file    Emotionally abused: Not on file    Physically abused: Not on file    Forced sexual activity: Not on file  Other Topics Concern  . Not on file  Social History Narrative   Lives at home with her two children.   Occasional caffeine use.   Right-handed.     PHYSICAL EXAM   Vitals:   12/19/17 0822  BP: 122/79  Pulse: 87  Weight: 236  lb 8 oz (107.3 kg)  Height: 5' 6"  (1.676 m)    Not recorded      Body mass index is 38.17 kg/m.  PHYSICAL EXAMNIATION:  Gen: NAD, conversant, well nourised, obese, well groomed                     Cardiovascular: Regular rate rhythm, no peripheral edema, warm, nontender. Eyes: Conjunctivae clear without exudates or hemorrhage Neck: Supple, no carotid bruits. Pulmonary: Clear to auscultation bilaterally   NEUROLOGICAL EXAM:  MENTAL STATUS: Speech:    Speech is normal; fluent and spontaneous with normal comprehension.  Cognition:     Orientation to time, place and person     Normal recent and remote memory     Normal Attention span and concentration     Normal Language, naming, repeating,spontaneous speech     Fund of knowledge   CRANIAL NERVES: CN II: Visual fields are full to confrontation. Fundoscopic exam is normal with sharp discs and no vascular changes. Pupils are round equal and briskly reactive to light. CN III, IV, VI: extraocular movement are normal. No ptosis. CN V: Facial sensation is intact to pinprick in all 3 divisions bilaterally. Corneal responses are intact.  CN VII: Face is symmetric with normal eye closure and smile. CN VIII: Hearing is normal to rubbing fingers CN IX, X: Palate elevates symmetrically. Phonation is normal. CN XI: Head turning and shoulder shrug are intact CN XII: Tongue is midline with normal movements and no atrophy.  MOTOR: There is no pronator drift of  out-stretched arms. Muscle bulk and tone are normal. Muscle strength is normal.  REFLEXES: Reflexes are 2+ and symmetric at the biceps, triceps, knees, and ankles. Plantar responses are flexor.  SENSORY: Intact to light touch, pinprick, positional sensation and vibratory sensation are intact in fingers and toes.  COORDINATION: Rapid alternating movements and fine finger movements are intact. There is no dysmetria on finger-to-nose and heel-knee-shin.    GAIT/STANCE: Exaggerated gait abnormality,   DIAGNOSTIC DATA (LABS, IMAGING, TESTING) - I reviewed patient records, labs, notes, testing and imaging myself where available.   ASSESSMENT AND PLAN  URIEL HORKEY is a 42 y.o. female   Chronic low back pain, radiating pain to bilateral lower extremity   MRI of lumbar spine  Referred to pain management, physical therapy  Laboratory evaluations TSH, B12   Marcial Pacas, M.D. Ph.D.  [facility] [address] [place], [place] [address] Ph: [phone] Fax: [phone]  CC: [place], [place], [place]

## 2017-12-20 ENCOUNTER — Telehealth: Payer: Self-pay | Admitting: Neurology

## 2017-12-20 LAB — HEMOGLOBIN A1C
Est. average glucose Bld gHb Est-mCnc: 169 mg/dL
Hgb A1c MFr Bld: 7.5 % — ABNORMAL HIGH (ref 4.8–5.6)

## 2017-12-20 LAB — CBC WITH DIFFERENTIAL/PLATELET
BASOS: 1 %
Basophils Absolute: 0 10*3/uL (ref 0.0–0.2)
EOS (ABSOLUTE): 0.2 10*3/uL (ref 0.0–0.4)
Eos: 3 %
Hematocrit: 41.7 % (ref 34.0–46.6)
Hemoglobin: 13 g/dL (ref 11.1–15.9)
Immature Grans (Abs): 0 10*3/uL (ref 0.0–0.1)
Immature Granulocytes: 0 %
LYMPHS ABS: 2.8 10*3/uL (ref 0.7–3.1)
Lymphs: 38 %
MCH: 23.9 pg — AB (ref 26.6–33.0)
MCHC: 31.2 g/dL — AB (ref 31.5–35.7)
MCV: 77 fL — AB (ref 79–97)
MONOS ABS: 0.6 10*3/uL (ref 0.1–0.9)
Monocytes: 8 %
NEUTROS ABS: 3.8 10*3/uL (ref 1.4–7.0)
Neutrophils: 50 %
Platelets: 282 10*3/uL (ref 150–450)
RBC: 5.44 x10E6/uL — ABNORMAL HIGH (ref 3.77–5.28)
RDW: 14.1 % (ref 12.3–15.4)
WBC: 7.5 10*3/uL (ref 3.4–10.8)

## 2017-12-20 LAB — CK: CK TOTAL: 250 U/L — AB (ref 24–173)

## 2017-12-20 LAB — COMPREHENSIVE METABOLIC PANEL
A/G RATIO: 1.4 (ref 1.2–2.2)
ALBUMIN: 4 g/dL (ref 3.5–5.5)
ALT: 16 IU/L (ref 0–32)
AST: 18 IU/L (ref 0–40)
Alkaline Phosphatase: 119 IU/L — ABNORMAL HIGH (ref 39–117)
BILIRUBIN TOTAL: 0.2 mg/dL (ref 0.0–1.2)
BUN / CREAT RATIO: 21 (ref 9–23)
BUN: 14 mg/dL (ref 6–24)
CHLORIDE: 106 mmol/L (ref 96–106)
CO2: 17 mmol/L — ABNORMAL LOW (ref 20–29)
Calcium: 9.1 mg/dL (ref 8.7–10.2)
Creatinine, Ser: 0.67 mg/dL (ref 0.57–1.00)
GFR calc non Af Amer: 110 mL/min/{1.73_m2} (ref >59)
GFR, EST AFRICAN AMERICAN: 126 mL/min/{1.73_m2} (ref >59)
GLOBULIN, TOTAL: 2.8 g/dL (ref 1.5–4.5)
Glucose: 186 mg/dL — ABNORMAL HIGH (ref 65–99)
POTASSIUM: 4.2 mmol/L (ref 3.5–5.2)
SODIUM: 139 mmol/L (ref 134–144)
Total Protein: 6.8 g/dL (ref 6.0–8.5)

## 2017-12-20 LAB — C-REACTIVE PROTEIN: CRP: 5 mg/L (ref 0–10)

## 2017-12-20 LAB — VITAMIN B12: VITAMIN B 12: 219 pg/mL — AB (ref 232–1245)

## 2017-12-20 LAB — TSH: TSH: 1.79 u[IU]/mL (ref 0.450–4.500)

## 2017-12-20 LAB — RPR: RPR: NONREACTIVE

## 2017-12-20 NOTE — Telephone Encounter (Signed)
Please call patient, laboratory evaluation showed elevated A1c of 7.5, this will make the diagnosis of diabetes, she should contact her primary care physician for treatment. I have faxed the result to her PCP  CBC, and CMP showed no significant abnormality, normal TSH, C-reactive protein,  Evidence of mildly decreased vitamin B12 level, she will benefit over-the-counter vitamin B12 supplement 1000 mcg daily

## 2017-12-20 NOTE — Telephone Encounter (Signed)
I called patient and went over lab results with her. She will f/u back up with PCP about hemoglobin A1c and B12 level. She already takes inj for B12. I gave her the value she was at: 85.

## 2017-12-25 ENCOUNTER — Other Ambulatory Visit: Payer: Self-pay

## 2017-12-25 ENCOUNTER — Ambulatory Visit: Payer: Medicaid Other | Attending: Neurology | Admitting: Physical Therapy

## 2017-12-25 ENCOUNTER — Encounter: Payer: Self-pay | Admitting: Physical Therapy

## 2017-12-25 DIAGNOSIS — R262 Difficulty in walking, not elsewhere classified: Secondary | ICD-10-CM | POA: Insufficient documentation

## 2017-12-25 DIAGNOSIS — M6281 Muscle weakness (generalized): Secondary | ICD-10-CM | POA: Insufficient documentation

## 2017-12-25 DIAGNOSIS — G8929 Other chronic pain: Secondary | ICD-10-CM | POA: Diagnosis present

## 2017-12-25 DIAGNOSIS — M5442 Lumbago with sciatica, left side: Secondary | ICD-10-CM | POA: Insufficient documentation

## 2017-12-25 NOTE — Therapy (Signed)
[facility] [address]  [place], [place], [address] Phone: [phone]   Fax:  [phone]  Physical Therapy Evaluation  Patient Details  Name: Courtney Davies MRN: [id number] Date of Birth: 07/15/1975 Referring Provider: Marcial Pacas, MD   Encounter Date: 12/25/2017  PT End of Session - 12/25/17 1009    Visit Number  1    Number of Visits  4    Date for PT Re-Evaluation  01/15/18    Authorization Type  Medicaid    PT Start Time  0927    PT Stop Time  1003    PT Time Calculation (min)  36 min    Activity Tolerance  Patient tolerated treatment well;Patient limited by pain    Behavior During Therapy  [facility] for tasks assessed/performed       Past Medical History:  Diagnosis Date  . Anemia 09/2015  . Arthritis    "knees" (04/23/2017)  . Asthma    "teens; went away; came back" (04/23/2017)  . B12 deficiency anemia 04/23/2017  . Chronic bronchitis ([place])   . Chronic lower back pain   . Fatty liver   . GERD (gastroesophageal reflux disease)   . Headache    "1-2/wk" (04/23/2017)  . History of blood transfusion "plenty"   "related to anemia" (04/23/2017)  . Low back pain   . Migraine    "1-2/month" (04/23/2017)  . Symptomatic anemia 04/23/2017    Past Surgical History:  Procedure Laterality Date  . ABDOMINAL HYSTERECTOMY    . [place]; 2002  . INGUINAL HERNIA REPAIR Bilateral 1980s    "total of 4 surgeries" (04/23/2017)  . TUBAL LIGATION  2002    There were no vitals filed for this visit.   Subjective Assessment - 12/25/17 0929    Subjective  Patient reports chronic LBP of >5 years duration. Pain comes and goes, but is very severe when she does have pain. Current episode has lasted a year. Pain starts in L LB, radiates down buttock and down to L toes. Reports N/T sensation throughout this area as well. Unable to identify aggravating factors, reporting "everything hurts." Easing factors: meds, somewhat.  Reports she fell and tore L meniscus, went into surgery on 08/15 but could not perform surgery d/t wheezing. Seeing specialist this week.  Having difficulty sitting on low commode, stairs, getting dressed, getting in/out of car.      Pertinent History  DM, B12 deficiency anemia, migraine, LBP, GERD, chronic bronchitis, asthma     Limitations  Sitting;Lifting;Standing;Walking;Writing;House hold activities    How long can you sit comfortably?  ~5 min before increase in pain    How long can you stand comfortably?  1 hour    How long can you walk comfortably?  0 minutes    Diagnostic tests  lumbar MRI scheduled for 12/28/17    Patient Stated Goals  this pain going away; sit, stand, walk without pain    Currently in Pain?  Yes    Pain Score  9     Pain Location  Back    Pain Orientation  Right    Pain Descriptors / Indicators  Sharp;Shooting    Pain Type  Chronic pain    Pain Radiating Towards  L LE down to foot    Aggravating Factors   unable to identify    Pain Relieving Factors  meds         [facility] PT Assessment - 12/25/17 6270  Assessment   Medical Diagnosis  Chronic LBP with sciatica    Referring Provider  Marcial Pacas, MD    Onset Date/Surgical Date  12/25/16    Next MD Visit  --   not scheduled   Prior Therapy  Yes- for hand      Precautions   Precautions  None      Restrictions   Weight Bearing Restrictions  No      Balance Screen   Has the patient fallen in the past 6 months  Yes    How many times?  2   taking shower, getting dressed   Has the patient had a decrease in activity level because of a fear of falling?   Yes    Is the patient reluctant to leave their home because of a fear of falling?   Yes      [place]  Private residence    Living Arrangements  Children    Available Help at Discharge  Family    Type of [place] to enter    Entrance Stairs-Number of Steps  2    Celebration  One level      Prior [place]  Unemployed    Leisure  exercising, being social      Cognition   Overall Cognitive Status  Within Functional Limits for tasks assessed      Sensation   Light Touch  Appears Intact      Coordination   Gross Motor Movements are Fluid and Coordinated  Yes   limited by pain     Posture/Postural Control   Posture/Postural Control  Postural limitations    Postural Limitations  Rounded Shoulders;Weight shift right;Weight shift left      ROM / Strength   AROM / PROM / Strength  AROM;Strength      AROM   AROM Assessment Site  Lumbar   severe pain at baseline   Lumbar Flexion  proximal shin    Lumbar Extension  WFL   "feels okay"   Lumbar - Right Side Bend  distal thigh   pain in LB   Lumbar - Left Side Bend  distal thigh    Lumbar - Right Rotation  moderately limited    Lumbar - Left Rotation  moderately limited      Strength   Strength Assessment Site  Hip;Knee;Ankle   all MMTing limited by pain   Right/Left Hip  Right;Left    Right Hip Flexion  4/5    Right Hip ABduction  3/5    Right Hip ADduction  3/5    Left Hip Flexion  3-/5    Left Hip ABduction  3/5    Left Hip ADduction  3/5    Right/Left Knee  Right;Left    Right Knee Flexion  4/5    Right Knee Extension  4/5    Left Knee Flexion  3-/5   limited by pain   Left Knee Extension  3-/5   limited by pain   Right/Left Ankle  Right;Left    Right Ankle Dorsiflexion  3-/5    Right Ankle Plantar Flexion  3-/5    Left Ankle Dorsiflexion  3-/5    Left Ankle Plantar Flexion  3-/5      Palpation   Palpation comment  TTP over B lumbar paraspinals with trigger point in mid lumbar paraspinals- e  Ambulation/Gait   Gait Pattern  Step-to pattern;Decreased step length - right;Decreased stance time - left;Decreased weight shift to right;Lateral trunk lean to left;Poor foot clearance - right;Poor foot clearance - left;Antalgic;Trunk flexed   severely antalgic  throughout               Objective measurements completed on examination: See above findings.              PT Education - 12/25/17 1009    Education Details  prognosis, POC, HEP    Person(s) Educated  Patient    Methods  Explanation;Demonstration;Tactile cues;Verbal cues;Handout    Comprehension  Returned demonstration;Verbalized understanding       PT Short Term Goals - 12/25/17 1020      PT SHORT TERM GOAL #1   Title  Patient to be independent with initial HEP.    Time  1    Period  Weeks    Status  New    Target Date  01/01/18        PT Long Term Goals - 12/25/17 1020      PT LONG TERM GOAL #1   Title  Patient to be independent with advanced HEP.    Time  3    Period  Weeks    Status  New    Target Date  01/15/18      PT LONG TERM GOAL #2   Title  Patient to demonstrate [facility] and pain <4/10 with lumbar AROM.    Time  3    Period  Weeks    Status  New    Target Date  01/15/18      PT LONG TERM GOAL #3   Title  Patient to demonstrate B LE strength >=4/5.    Time  3    Period  Weeks    Status  New    Target Date  01/15/18      PT LONG TERM GOAL #4   Title  Patient to report tolerance of 20 min of walking without increase in pain level.    Time  3    Period  Weeks    Status  New    Target Date  01/15/18      PT LONG TERM GOAL #5   Title  Patient to report tolerance of 15 min of sitting without increase in pain level.    Time  3    Period  Weeks    Status  New    Target Date  01/15/18             Plan - 12/25/17 1010    Clinical Impression Statement  Patient is a 42 y/o F presenting to OPPT with c/o L sided LBP radiating down L LE. Reports fluctuating symptoms for the past 5 years with recent exacerbation lasting a year. Pain, numbness, and tingling start in L LB and radiate down buttock and down to L toes. Patient also with L knee meniscal tear- possibly getting surgery soon. Patient limited in sitting on low commode, stairs,  getting dressed, getting in/out of car at this time. Patient today with severe pain, TTP along B lumbar paraspinals, limited and painful lumbar AROM- best tolerance for extension, marked weakness in B LEs, and significant gait deviations. Assessment extremely limited this date d/t pain, as patient could not tolerance supine or prone positioning today. Offered patient modalities, which she declined. Patient received gentle HEP for ROM and strengthening and reported understanding. Advised patient not to push into  pain. Would benefit from skilled PT services 1x/week for 3 weeks to address aforementioned impairments.      Clinical Presentation  Stable    Clinical Decision Making  Low    Rehab Potential  Good    Clinical Impairments Affecting Rehab Potential  DM, B12 deficiency anemia, migraine, LBP, GERD, chronic bronchitis, asthma    PT Frequency  1x / week    PT Duration  3 weeks    PT Treatment/Interventions  ADLs/Self Care Home Management;Cryotherapy;Electrical Stimulation;Moist Heat;Traction;Ultrasound;Gait training;Stair training;Functional mobility training;Therapeutic activities;Therapeutic exercise;Manual techniques;Patient/family education;Neuromuscular re-education;Balance training;Passive range of motion;Dry needling;Energy conservation;Splinting;Taping    PT Next Visit Plan  reassess HEP, e-stim    Consulted and Agree with Plan of Care  Patient       Patient will benefit from skilled therapeutic intervention in order to improve the following deficits and impairments:  Decreased endurance, Decreased activity tolerance, Decreased strength, Pain, Decreased balance, Decreased mobility, Difficulty walking, Abnormal gait, Decreased range of motion, Improper body mechanics, Postural dysfunction  Visit Diagnosis: Chronic left-sided low back pain with left-sided sciatica  Muscle weakness (generalized)  Difficulty in walking, not elsewhere classified     Problem List Patient Active Problem  List   Diagnosis Date Noted  . 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/19/2017  . Anemia 04/23/2017  . Acute bronchitis 04/23/2017  . Chronic back pain 10/03/2015  . Symptomatic anemia 04/10/2014  . Bilateral edema of lower extremity   . Low back pain with left-sided sciatica      Janene Harvey, PT, DPT 12/25/17 10:25 AM   [facility] [address]  [place], [place], [address] Phone: [phone]   Fax:  [phone]  Name: GEORGANNE SIPLE MRN: [id number] Date of Birth: 05-Apr-1976

## 2017-12-27 ENCOUNTER — Ambulatory Visit: Payer: Medicaid Other | Admitting: Pulmonary Disease

## 2017-12-27 ENCOUNTER — Encounter: Payer: Self-pay | Admitting: Pulmonary Disease

## 2017-12-27 ENCOUNTER — Other Ambulatory Visit: Payer: Medicaid Other

## 2017-12-27 VITALS — BP 130/70 | HR 86 | Ht 65.75 in | Wt 234.4 lb

## 2017-12-27 DIAGNOSIS — J209 Acute bronchitis, unspecified: Secondary | ICD-10-CM | POA: Diagnosis not present

## 2017-12-27 DIAGNOSIS — J440 Chronic obstructive pulmonary disease with acute lower respiratory infection: Secondary | ICD-10-CM

## 2017-12-27 DIAGNOSIS — J45909 Unspecified asthma, uncomplicated: Secondary | ICD-10-CM

## 2017-12-27 LAB — NITRIC OXIDE

## 2017-12-27 MED ORDER — PANTOPRAZOLE SODIUM 40 MG PO TBEC: 40.0000 mg | DELAYED_RELEASE_TABLET | Freq: Two times a day (BID) | ORAL | 2 refills | Status: DC

## 2017-12-27 MED ORDER — BUPROPION HCL ER (SR) 150 MG PO TB12: ORAL_TABLET | ORAL | 2 refills | Status: DC

## 2017-12-27 NOTE — Patient Instructions (Signed)
We will check IgE, chest x-ray, PFTs today Continue the Breo as you are doing Start the Protonix 40 mg twice daily for acid reflux Use chlorpheniramine 8 mg 3 times daily and Flonase nasal spray for postnasal drip  Continue to work on smoking cessation with nicotine We will give a prescription for Wellbutrin Follow-up in 2-4 weeks.

## 2017-12-27 NOTE — Progress Notes (Signed)
Courtney Davies    [id number]    [birth year]  Primary Care Physician:Fulbright, Bennetta Laos, PA-C  Referring Physician: Mcarthur Rossetti, MD [address] [place], [place] [address]  Chief complaint: Consult for wheezing  HPI: 42 year old with history of asthma as a child, GERD, chronic bronchitis, active smoker, osteoarthritis She was scheduled for knee arthroscopy, partial medial meniscectomy and debridement by orthopedics in August 2019 but the procedure to be canceled since she was wheezing She has since followed up with her primary care and started on breo.  She has been on this for the past 2 weeks but cannot tell if this is working yet.  She is previously been on albuterol rescue inhaler  Has daily symptoms of dyspnea, wheezing, cough with mucus production.  Denies any fevers, chills.  Pets: No pets Occupation: Used to work as a Training and development officer.  Currently unemployed Exposures: May have mold at home as she reports a musty smell.  No other known exposures. Smoking history: 25-pack-year smoker.  Continues to smoke 1 pack/day Travel history: No significant travel Relevant family history: No significant family history of lung disease.  Physical Exam: Blood pressure 130/70, pulse 86, height 5' 5.75" (1.67 m), weight 234 lb 6.4 oz (106.3 kg), last menstrual period 12/03/2012, SpO2 98 %. Gen:      No acute distress HEENT:  EOMI, sclera anicteric Neck:     No masses; no thyromegaly Lungs:    Clear to auscultation bilaterally; normal respiratory effort CV:         Regular rate and rhythm; no murmurs Abd:      + bowel sounds; soft, non-tender; no palpable masses, no distension Ext:    No edema; adequate peripheral perfusion Skin:      Warm and dry; no rash Neuro: alert and oriented x 3 Psych: normal mood and affect  Data Reviewed: Imaging: Chest x-ray 04/23/2017- clear lungs with no acute abnormality.  I have reviewed the images personally.  PFTs:  Labs: CBC  12/19/2017-WBC 7.5, eos 3%, absolute eosinophil count 225  Assessment:  Dyspnea Likely has COPD she is a heavy smoker.  She does have asthma as a child which she grew out of Possible mold exposure Suspect ongoing GERD, postnasal drip may be contributing to symptoms.  Recently started on breo.  Will continue the same for now Start chlorpheniramine, Flonase for postnasal drip Resume Protonix 40 mg twice daily Check chest x-ray, PFTs, IgE  Active smoker She wants to quit smoking.  Nicotine has not helped in the past. We will send in a prescription for Wellbutrin.  Asked her to use it along with nicotine to increased shortness of smoking cessation.  Reevaluate at return visit.    Preop for orthopedic knee surgery We will hold off on clearance until we have reviewed the tests and evaluated in the clinic.  Plan/Recommendations: - Continue Breo - Start chlorpheniramine, Flonase, Protonix - Check chest x-ray, PFTs, IgE - Smoking cessation with Wellbutrin, nicotine  Outpatient Encounter Medications as of 12/27/2017  Medication Sig  . albuterol (PROVENTIL HFA;VENTOLIN HFA) 108 (90 Base) MCG/ACT inhaler Inhale 2 puffs into the lungs every 6 (six) hours as needed for wheezing or shortness of breath.  Marland Kitchen BREO ELLIPTA 200-25 MCG/INH AEPB INL 1 PUFF ITL D  . Cyanocobalamin (VITAMIN B-12 IJ) Inject as directed.  . gabapentin (NEURONTIN) 300 MG capsule Take 1 capsule (300 mg total) by mouth 3 (three) times daily.  Marland Kitchen ibuprofen (ADVIL,MOTRIN) 600 MG  tablet Take 1 tablet (600 mg total) by mouth every 6 (six) hours as needed.  . linaclotide (LINZESS) 290 MCG CAPS capsule Take 1 capsule (290 mcg total) by mouth daily before breakfast.  . MUCINEX D 60-600 MG 12 hr tablet Take 1 tablet by mouth 2 (two) times daily as needed. Mucous relief  . pantoprazole (PROTONIX) 40 MG tablet Take 1 tablet (40 mg total) by mouth daily.  . vitamin C (ASCORBIC ACID) 500 MG tablet Take 500 mg by mouth daily.  . Vitamin D,  Ergocalciferol, (DRISDOL) 50000 units CAPS capsule Take by mouth.  . diazepam (VALIUM) 5 MG tablet Take 1 tablet (5 mg total) by mouth every 6 (six) hours as needed (MRI).  . [DISCONTINUED] diazepam (VALIUM) 5 MG tablet Take one by mouth one hour prior to MRI, repeat as needed  . [DISCONTINUED] HYDROcodone-acetaminophen (NORCO/VICODIN) 5-325 MG tablet Take 1-2 tablets by mouth every 6 (six) hours as needed. (Patient not taking: Reported on 12/27/2017)  . [DISCONTINUED] Syringe/Needle, Disp, (SYRINGE 3CC/18GX1-1/2") 18G X 1-1/2" 3 ML MISC 1 application by Does not apply route once a week.   No facility-administered encounter medications on file as of 12/27/2017.     Allergies as of 12/27/2017 - Review Complete 12/27/2017  Allergen Reaction Noted  . Other Other (See Comments), Anaphylaxis, Swelling, and Hives 04/11/2014  . Latex Hives 04/29/2016  . Codeine Nausea And Vomiting 12/14/2012  . Nitrofurantoin Rash 01/11/2015    Past Medical History:  Diagnosis Date  . Anemia 09/2015  . Arthritis    "knees" (04/23/2017)  . Asthma    "teens; went away; came back" (04/23/2017)  . B12 deficiency anemia 04/23/2017  . Chronic bronchitis ([place])   . Chronic lower back pain   . Fatty liver   . GERD (gastroesophageal reflux disease)   . Headache    "1-2/wk" (04/23/2017)  . History of blood transfusion "plenty"   "related to anemia" (04/23/2017)  . Low back pain   . Migraine    "1-2/month" (04/23/2017)  . Symptomatic anemia 04/23/2017    Past Surgical History:  Procedure Laterality Date  . ABDOMINAL HYSTERECTOMY    . [place]; 2002  . INGUINAL HERNIA REPAIR Bilateral 1980s    "total of 4 surgeries" (04/23/2017)  . TUBAL LIGATION  2002    Family History  Problem Relation Age of Onset  . Anemia Paternal Grandmother   . Valvular heart disease Paternal Grandmother   . Diabetes Mother   . Hypertension Mother   . Diabetes Maternal Grandmother   . Prostate cancer Maternal Uncle        ?  intestinal also  . Diabetes Father     Social History   Socioeconomic History  . Marital status: Single    Spouse name: Not on file  . Number of children: 2  . Years of education: 11th  . Highest education level: Not on file  Occupational History  . Occupation: Advertising account planner in [place]  . Financial resource strain: Not on file  . Food insecurity:    Worry: Not on file    Inability: Not on file  . Transportation needs:    Medical: Not on file    Non-medical: Not on file  Tobacco Use  . Smoking status: Current Every Day Smoker    Packs/day: 1.00    Years: 25.00    Pack years: 25.00    Types: Cigarettes  . Smokeless tobacco: Never Used  Substance and Sexual Activity  .  Alcohol use: Yes    Alcohol/week: 0.0 standard drinks    Comment: rarely  . Drug use: Yes    Types: Marijuana    Comment: daily use  . Sexual activity: Not on file  Lifestyle  . Physical activity:    Days per week: Not on file    Minutes per session: Not on file  . Stress: Not on file  Relationships  . Social connections:    Talks on phone: Not on file    Gets together: Not on file    Attends religious service: Not on file    Active member of club or organization: Not on file    Attends meetings of clubs or organizations: Not on file    Relationship status: Not on file  . Intimate partner violence:    Fear of current or ex partner: Not on file    Emotionally abused: Not on file    Physically abused: Not on file    Forced sexual activity: Not on file  Other Topics Concern  . Not on file  Social History Narrative   Lives at home with her two children.   Occasional caffeine use.   Right-handed.    Review of systems: Review of Systems  Constitutional: Negative for fever and chills.  HENT: Negative.   Eyes: Negative for blurred vision.  Respiratory: as per HPI  Cardiovascular: Negative for chest pain and palpitations.  Gastrointestinal: Negative for vomiting, diarrhea, blood  per rectum. Genitourinary: Negative for dysuria, urgency, frequency and hematuria.  Musculoskeletal: Negative for myalgias, back pain and joint pain.  Skin: Negative for itching and rash.  Neurological: Negative for dizziness, tremors, focal weakness, seizures and loss of consciousness.  Endo/Heme/Allergies: Negative for environmental allergies.  Psychiatric/Behavioral: Negative for depression, suicidal ideas and hallucinations.  All other systems reviewed and are negative.  Marshell Garfinkel MD [facility] 12/27/2017, 1:51 PM  CC: Mcarthur Rossetti*

## 2017-12-28 ENCOUNTER — Ambulatory Visit
Admission: RE | Admit: 2017-12-28 | Discharge: 2017-12-28 | Disposition: A | Discharge status: Home / Self Care | Payer: Medicaid Other | Source: Ambulatory Visit | Attending: Neurology | Admitting: Neurology

## 2017-12-28 DIAGNOSIS — M5440 Lumbago with sciatica, unspecified side: Secondary | ICD-10-CM | POA: Diagnosis not present

## 2017-12-28 DIAGNOSIS — G8929 Other chronic pain: Secondary | ICD-10-CM

## 2017-12-30 ENCOUNTER — Telehealth: Payer: Self-pay | Admitting: Neurology

## 2017-12-30 LAB — IGE: IGE (IMMUNOGLOBULIN E), SERUM: 3 kU/L (ref <=114)

## 2017-12-30 NOTE — Telephone Encounter (Addendum)
Please call patient, MRI of lumbar region showed multiple level degenerative disc disease most severe at L4-5, L5-S1, there is mild to moderate left-sided foraminal narrowing,  she will benefit continued physical therapy and pain management       IMPRESSION: This MRI of the lumbar spine without contrast shows the following: 1.     At L5-S1, there is a small left paramedian disc herniation and facet hypertrophy.  There is moderately severe left lateral recess stenosis with potential for left S1 nerve root compression.  There is also mild to moderate foraminal narrowing, worse on the right that does not appear to lead to nerve root compression. 2.     At L4-L5, there is a left paramedian disc protrusion causing mild to moderate left lateral recess stenosis but it does not appear to lead to any nerve root compression.   3.     There are also milder degenerative changes at L1-L2 and L3-L4 not leading to significant foraminal narrowing, lateral recess stenosis or nerve root compression.

## 2017-12-31 NOTE — Telephone Encounter (Signed)
Called and spoke to Patient relayed Pain mgt takes several weeks to get scheduled for an apt. I relayed to patient referral was sent to [facility] on 12/19/2017 . Patient asked If I could also send to [facility] medial center in [facility] telephone [phone]- fax [phone]. Referral has been sent. Patient thinks she will get in sooner here.

## 2017-12-31 NOTE — Telephone Encounter (Signed)
Called the patient to make her aware of the lumbar MRI findings. Reviewed the findings with her and informed her that Dr Krista Blue feels that she will be best treated by continuing her PT to help with this as well as pain management through the pain clinic that the referral was sent to. Patient states that she has started the PT and has been working with them. She states she has not heard from the pain clinic yet. I informed the patient I would touch base with our referral dept and see if they will follow up on this for [place].Pt verbalized understanding. Pt had no questions at this time but was encouraged to call back if questions arise. Patient was appreciative for the call.

## 2018-01-02 ENCOUNTER — Ambulatory Visit: Payer: Medicaid Other

## 2018-01-02 DIAGNOSIS — G8929 Other chronic pain: Secondary | ICD-10-CM

## 2018-01-02 DIAGNOSIS — M6281 Muscle weakness (generalized): Secondary | ICD-10-CM

## 2018-01-02 DIAGNOSIS — R262 Difficulty in walking, not elsewhere classified: Secondary | ICD-10-CM

## 2018-01-02 DIAGNOSIS — M5442 Lumbago with sciatica, left side: Principal | ICD-10-CM

## 2018-01-02 NOTE — Therapy (Signed)
[facility] [address]  [place], [place], [address] Phone: [phone]   Fax:  [phone]  Physical Therapy Treatment  Patient Details  Name: Courtney Davies MRN: [id number] Date of Birth: 12-11-1975 Referring Provider: Marcial Pacas, MD   Encounter Date: 01/02/2018  PT End of Session - 01/02/18 0936    Visit Number  2    Number of Visits  4    Date for PT Re-Evaluation  01/15/18    Authorization Type  Medicaid    PT Start Time  4709   ended with 10 min moist heat   PT Stop Time  1024    PT Time Calculation (min)  50 min    Activity Davies  Patient tolerated treatment well;Patient limited by pain    Behavior During Therapy  [facility] for tasks assessed/performed       Past Medical History:  Diagnosis Date  . Anemia 09/2015  . Arthritis    "knees" (04/23/2017)  . Asthma    "teens; went away; came back" (04/23/2017)  . B12 deficiency anemia 04/23/2017  . Chronic bronchitis ([place])   . Chronic lower back pain   . Fatty liver   . GERD (gastroesophageal reflux disease)   . Headache    "1-2/wk" (04/23/2017)  . History of blood transfusion "plenty"   "related to anemia" (04/23/2017)  . Low back pain   . Migraine    "1-2/month" (04/23/2017)  . Symptomatic anemia 04/23/2017    Past Surgical History:  Procedure Laterality Date  . ABDOMINAL HYSTERECTOMY    . [place]; 2002  . INGUINAL HERNIA REPAIR Bilateral 1980s    "total of 4 surgeries" (04/23/2017)  . TUBAL LIGATION  2002    There were no vitals filed for this visit.  Subjective Assessment - 01/02/18 0936    Subjective  Pt. noting knee is primary concern today.      Pertinent History  DM, B12 deficiency anemia, migraine, LBP, GERD, chronic bronchitis, asthma     Patient Stated Goals  this pain going away; sit, stand, walk without pain    Currently in Pain?  Yes    Pain Score  7     Pain Location  Back    Pain Orientation  Right    Pain Descriptors  / Indicators  Sharp;Shooting    Pain Type  Chronic pain    Pain Radiating Towards  L LE down to foot     Aggravating Factors   Unable to identify     Pain Relieving Factors  Meds     Multiple Pain Sites  Yes    Pain Score  10    Pain Location  Knee    Pain Orientation  Left    Pain Descriptors / Indicators  Sharp    Pain Type  Acute pain    Pain Onset  More than a month ago                       [facility] Adult PT Treatment/Exercise - 01/02/18 0942      Lumbar Exercises: Stretches   Passive Hamstring Stretch  Right;Left;2 reps;30 seconds    Passive Hamstring Stretch Limitations  seated       Lumbar Exercises: Standing   Heel Raises  10 reps;3 seconds    Heel Raises Limitations  heel to toe wt. shift    Other Standing Lumbar Exercises  Standing lumbar extension x 10 reps  limited ROM and very painful     Lumbar Exercises: Seated   Other Seated Lumbar Exercises  Seated adduction ball squeeze 5" x 10 reps     Other Seated Lumbar Exercises  Seated scapular retraction 5" x 10 reps    pt. reporting some pain increase with htis      Modalities   Modalities  Moist Heat      Moist Heat Therapy   Number Minutes Moist Heat  10 Minutes    Moist Heat Location  Lumbar Spine   and T-spine in seated      Manual Therapy   Manual Therapy  Soft tissue mobilization    Manual therapy comments  sidelying with L LE elevated on bolster     Soft tissue mobilization  STM to lumbar/thoracic paraspinals; very ttp throughout    poor Davies for STM               PT Short Term Goals - 01/02/18 0941      PT SHORT TERM GOAL #1   Title  Patient to be independent with initial HEP.    Time  1    Period  Weeks    Status  Achieved        PT Long Term Goals - 01/02/18 0940      PT LONG TERM GOAL #1   Title  Patient to be independent with advanced HEP.    Time  3    Period  Weeks    Status  On-going      PT LONG TERM GOAL #2   Title  Patient to demonstrate WFL and  pain <4/10 with lumbar AROM.    Time  3    Period  Weeks    Status  On-going      PT LONG TERM GOAL #3   Title  Patient to demonstrate B LE strength >=4/5.    Time  3    Period  Weeks    Status  On-going      PT LONG TERM GOAL #4   Title  Patient to report Davies of 20 min of walking without increase in pain level.    Time  3    Period  Weeks    Status  On-going      PT LONG TERM GOAL #5   Title  Patient to report Davies of 15 min of sitting without increase in pain level.    Time  3    Period  Weeks    Status  On-going            Plan - 01/02/18 0938    Clinical Impression Statement  Courtney Davies for STM and therex in today's session.  Pt. reporting LBP and L knee pain increase with most gentle ROM and strengthening activities.  Pt. ttp throughout lumbar/thoracic paraspinals today and ended visit with moist heat for hopeful reduction in pain and tone.  Pt. refusing trial of E-stim today for pain relief, noting, "I don't want to use that until after my surgery".  Will continue to progress per pt. in coming visits.      PT Treatment/Interventions  ADLs/Self Care Home Management;Cryotherapy;Electrical Stimulation;Moist Heat;Traction;Ultrasound;Gait training;Stair training;Functional mobility training;Therapeutic activities;Therapeutic exercise;Manual techniques;Patient/family education;Neuromuscular re-education;Balance training;Passive range of motion;Dry needling;Energy conservation;Splinting;Taping    Consulted and Agree with Plan of Care  Patient       Patient will benefit from skilled therapeutic intervention in order to improve the following deficits and impairments:  Decreased  endurance, Decreased activity Davies, Decreased strength, Pain, Decreased balance, Decreased mobility, Difficulty walking, Abnormal gait, Decreased range of motion, Improper body mechanics, Postural dysfunction  Visit Diagnosis: 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/19/2017  . Anemia 04/23/2017  . Acute bronchitis 04/23/2017  . Chronic back pain 10/03/2015  . Symptomatic anemia 04/10/2014  . Bilateral edema of lower extremity   . Low back pain with left-sided sciatica     Bess Harvest, PTA 01/02/18 12:36 PM   [facility] [address]  [place], [place], [address] Phone: [phone]   Fax:  [phone]  Name: MCKYLA DECKMAN MRN: [id number] Date of Birth: 1975-06-08

## 2018-01-09 ENCOUNTER — Ambulatory Visit: Payer: Medicaid Other | Admitting: Physical Therapy

## 2018-01-09 ENCOUNTER — Encounter: Payer: Self-pay | Admitting: Physical Therapy

## 2018-01-09 DIAGNOSIS — M5442 Lumbago with sciatica, left side: Principal | ICD-10-CM

## 2018-01-09 DIAGNOSIS — G8929 Other chronic pain: Secondary | ICD-10-CM

## 2018-01-09 DIAGNOSIS — M6281 Muscle weakness (generalized): Secondary | ICD-10-CM

## 2018-01-09 DIAGNOSIS — R262 Difficulty in walking, not elsewhere classified: Secondary | ICD-10-CM

## 2018-01-09 NOTE — Therapy (Signed)
[facility] [address]  [place], [place], [address] Phone: [phone]   Fax:  [phone]  Physical Therapy Treatment  Patient Details  Name: Courtney Davies MRN: [id number] Date of Birth: [birth year] Referring Provider: Marcial Pacas, MD   Encounter Date: 01/09/2018  PT End of Session - 01/09/18 1012    Visit Number  3    Number of Visits  4    Date for PT Re-Evaluation  01/15/18    Authorization Type  Medicaid    PT Start Time  0931    PT Stop Time  1016   moist heat   PT Time Calculation (min)  45 min    Activity Tolerance  Patient tolerated treatment well;Patient limited by pain    Behavior During Therapy  [facility] for tasks assessed/performed       Past Medical History:  Diagnosis Date  . Anemia 09/2015  . Arthritis    "knees" (04/23/2017)  . Asthma    "teens; went away; came back" (04/23/2017)  . B12 deficiency anemia 04/23/2017  . Chronic bronchitis ([place])   . Chronic lower back pain   . Fatty liver   . GERD (gastroesophageal reflux disease)   . Headache    "1-2/wk" (04/23/2017)  . History of blood transfusion "plenty"   "related to anemia" (04/23/2017)  . Low back pain   . Migraine    "1-2/month" (04/23/2017)  . Symptomatic anemia 04/23/2017    Past Surgical History:  Procedure Laterality Date  . ABDOMINAL HYSTERECTOMY    . [place]; 2002  . INGUINAL HERNIA REPAIR Bilateral 1980s    "total of 4 surgeries" (04/23/2017)  . TUBAL LIGATION  2002    There were no vitals filed for this visit.  Subjective Assessment - 01/09/18 0932    Subjective  Reports everything is the same as last time. Awaiting pulmonary MD appointment to get cleared for knee surgery. Reports compliance with HEP. Went back to work and having R foot and calf pain.     Pertinent History  DM, B12 deficiency anemia, migraine, LBP, GERD, chronic bronchitis, asthma     Diagnostic tests  lumbar MRI scheduled for 12/28/17     Patient Stated Goals  this pain going away; sit, stand, walk without pain    Currently in Pain?  Yes    Pain Score  7     Pain Location  Back    Pain Orientation  Right    Pain Descriptors / Indicators  Sharp;Shooting    Pain Type  Chronic pain                       [facility] Adult PT Treatment/Exercise - 01/09/18 0001      Exercises   Exercises  Lumbar      Lumbar Exercises: Stretches   Gastroc Stretch  3 reps;20 seconds;Limitations    Gastroc Stretch Limitations  longsitting to tolerance      Lumbar Exercises: Standing   Heel Raises  20 reps;Limitations   cues for upright trunk   Heel Raises Limitations  heel/toe raises with B chair support     Other Standing Lumbar Exercises  standing sidestepping with 2 HHA 2x56f    cues for scap squeeze and avoid ant trunk lean   Other Standing Lumbar Exercises  R & L weight shift 10x5" each side with B UE support on chair      Lumbar Exercises: Seated  Sit to Stand Limitations  sitting sciatic nerve glide 10x each LE to tolerance    Other Seated Lumbar Exercises  Seated adduction ball squeeze 10" x 10 reps     Other Seated Lumbar Exercises  pallof press red TB 10x each side   cues for full ROM and tight core     Lumbar Exercises: Supine   Bridge  10 reps    Bridge Limitations  2x10; within limited range d/t pain    Other Supine Lumbar Exercises  TrA + alt march 20x    Other Supine Lumbar Exercises  TrA 10x10"      Moist Heat Therapy   Number Minutes Moist Heat  10 Minutes    Moist Heat Location  Lumbar Spine   in sitting     Manual Therapy   Manual Therapy  Soft tissue mobilization    Soft tissue mobilization  edu and practice of self massage on R plantar surface of foot to relieve muscle cramp x 5 min             PT Education - 01/09/18 1012    Education Details  update to HEP    Person(s) Educated  Patient    Methods  Explanation;Demonstration;Tactile cues;Verbal cues;Handout    Comprehension   Verbalized understanding;Returned demonstration       PT Short Term Goals - 01/02/18 0941      PT SHORT TERM GOAL #1   Title  Patient to be independent with initial HEP.    Time  1    Period  Weeks    Status  Achieved        PT Long Term Goals - 01/02/18 0940      PT LONG TERM GOAL #1   Title  Patient to be independent with advanced HEP.    Time  3    Period  Weeks    Status  On-going      PT LONG TERM GOAL #2   Title  Patient to demonstrate WFL and pain <4/10 with lumbar AROM.    Time  3    Period  Weeks    Status  On-going      PT LONG TERM GOAL #3   Title  Patient to demonstrate B LE strength >=4/5.    Time  3    Period  Weeks    Status  On-going      PT LONG TERM GOAL #4   Title  Patient to report tolerance of 20 min of walking without increase in pain level.    Time  3    Period  Weeks    Status  On-going      PT LONG TERM GOAL #5   Title  Patient to report tolerance of 15 min of sitting without increase in pain level.    Time  3    Period  Weeks    Status  On-going            Plan - 01/09/18 1013    Clinical Impression Statement  Patient arrived to session with report of no change in symptoms since last session. Patient still ambulating with severely antalgic gait. Reports that she went back to work this week which required prolonged standing for 9 hours. Patient reporting that she compensates for L knee pain by shifting weight to R LE- now with cramping sensation in R calf and ankle. Educated and instructed patient on seated gastroc stretch and ball self-massage to R plantar surface of  foot for pain relief. Also advised patient to take breaks for stretching and intermittent lumbar extensions for pain relief.  Patient with limited tolerance of supine and bent knee positioning today d/t L knee pain. Able to tolerate sitting sciatic nerve glides with instruction not to push into pain. Patient severely antalgic and with anterior trunk lean throughout standing  activities. Ended session with moist heat to LB at end of session for pain relief. Educated patient on use of e-stim for pain relief- possible [place] next session. Relief of pain at end of session.     Clinical Impairments Affecting Rehab Potential  DM, B12 deficiency anemia, migraine, LBP, GERD, chronic bronchitis, asthma    PT Treatment/Interventions  ADLs/Self Care Home Management;Cryotherapy;Electrical Stimulation;Moist Heat;Traction;Ultrasound;Gait training;Stair training;Functional mobility training;Therapeutic activities;Therapeutic exercise;Manual techniques;Patient/family education;Neuromuscular re-education;Balance training;Passive range of motion;Dry needling;Energy conservation;Splinting;Taping    PT Next Visit Plan  e-stim    Consulted and Agree with Plan of Care  Patient       Patient will benefit from skilled therapeutic intervention in order to improve the following deficits and impairments:  Decreased endurance, Decreased activity tolerance, Decreased strength, Pain, Decreased balance, Decreased mobility, Difficulty walking, Abnormal gait, Decreased range of motion, Improper body mechanics, Postural dysfunction  Visit Diagnosis: Chronic left-sided low back pain with left-sided sciatica  Muscle weakness (generalized)  Difficulty in walking, not elsewhere classified     Problem List Patient Active Problem List   Diagnosis Date Noted  . 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/19/2017  . Anemia 04/23/2017  . Acute bronchitis 04/23/2017  . Chronic back pain 10/03/2015  . Symptomatic anemia 04/10/2014  . Bilateral edema of lower extremity   . Low back pain with left-sided sciatica      Janene Harvey, PT, DPT 01/09/18 10:18 AM   [facility] [address]  [place], [place], [address] Phone: [phone]   Fax:  [phone]  Name: Courtney Davies MRN: [id number] Date of Birth: 08-07-1975

## 2018-01-15 ENCOUNTER — Encounter: Payer: Self-pay | Admitting: Pulmonary Disease

## 2018-01-15 ENCOUNTER — Ambulatory Visit (INDEPENDENT_AMBULATORY_CARE_PROVIDER_SITE_OTHER): Payer: Medicaid Other | Admitting: Pulmonary Disease

## 2018-01-15 ENCOUNTER — Ambulatory Visit: Payer: Medicaid Other | Admitting: Pulmonary Disease

## 2018-01-15 VITALS — BP 120/80 | HR 87 | Ht 66.0 in | Wt 237.0 lb

## 2018-01-15 DIAGNOSIS — J45909 Unspecified asthma, uncomplicated: Secondary | ICD-10-CM

## 2018-01-15 DIAGNOSIS — J449 Chronic obstructive pulmonary disease, unspecified: Secondary | ICD-10-CM

## 2018-01-15 LAB — PULMONARY FUNCTION TEST
DL/VA % PRED: 96 %
DL/VA: 4.85 ml/min/mmHg/L
DLCO UNC % PRED: 79 %
DLCO UNC: 21.5 ml/min/mmHg
DLCO cor % pred: 80 %
DLCO cor: 21.77 ml/min/mmHg
FEF 25-75 PRE: 1.71 L/s
FEF 25-75 Post: 1.84 L/sec
FEF2575-%Change-Post: 7 %
FEF2575-%Pred-Post: 63 %
FEF2575-%Pred-Pre: 58 %
FEV1-%CHANGE-POST: 2 %
FEV1-%PRED-POST: 87 %
FEV1-%PRED-PRE: 86 %
FEV1-POST: 2.37 L
FEV1-PRE: 2.32 L
FEV1FVC-%Change-Post: 0 %
FEV1FVC-%Pred-Pre: 88 %
FEV6-%Change-Post: -1 %
FEV6-%PRED-POST: 96 %
FEV6-%Pred-Pre: 97 %
FEV6-Post: 3.1 L
FEV6-Pre: 3.15 L
FEV6FVC-%PRED-PRE: 102 %
FEV6FVC-%Pred-Post: 102 %
FVC-%Change-Post: 1 %
FVC-%Pred-Post: 97 %
FVC-%Pred-Pre: 96 %
FVC-Post: 3.19 L
FVC-Pre: 3.15 L
PRE FEV6/FVC RATIO: 100 %
Post FEV1/FVC ratio: 74 %
Post FEV6/FVC ratio: 100 %
Pre FEV1/FVC ratio: 74 %
RV % PRED: 111 %
RV: 1.91 L
TLC % PRED: 93 %
TLC: 5.02 L

## 2018-01-15 MED ORDER — VARENICLINE TARTRATE 1 MG PO TABS: 1.0000 mg | ORAL_TABLET | Freq: Two times a day (BID) | ORAL | 0 refills | Status: DC

## 2018-01-15 MED ORDER — VARENICLINE TARTRATE 0.5 MG X 11 & 1 MG X 42 PO MISC: ORAL | 0 refills | Status: DC

## 2018-01-15 NOTE — Patient Instructions (Addendum)
Continue the Breo and albuterol as needed I reviewed your PFTs which show very minimal changes You be okay for proceeding with a knee operation.  We will send a note to Dr. Ninfa Linden  Continue to work on smoking cessation.  We will call in a prescription for Chantix Follow-up in 3 months.

## 2018-01-15 NOTE — Progress Notes (Signed)
PFT done today. 

## 2018-01-15 NOTE — Progress Notes (Signed)
Courtney Davies    [id number]    June 10, 1975  Primary Care Physician:Fulbright, Bennetta Laos, PA-C  Referring Physician: Elisabeth Cara, PA-C [address] [place], [place] [address]  Chief complaint: Follow up for COPD, asthma, preop eval for knee surgery.  HPI: 42 year old with history of asthma as a child, GERD, chronic bronchitis, active smoker, osteoarthritis She was scheduled for knee arthroscopy, partial medial meniscectomy and debridement by orthopedics in August 2019 but the procedure to be canceled since she was wheezing She has since followed up with her primary care and started on breo.  She has been on this for the past 2 weeks but cannot tell if this is working yet.  She is previously been on albuterol rescue inhaler  Has daily symptoms of dyspnea, wheezing, cough with mucus production.  Denies any fevers, chills.  Pets: No pets Occupation: Used to work as a Training and development officer.  Currently unemployed Exposures: May have mold at home as she reports a musty smell.  No other known exposures. Smoking history: 25-pack-year smoker.  Continues to smoke 1 pack/day Travel history: No significant travel Relevant family history: No significant family history of lung disease.  Interim history: Continues on Breo.  Reports that her breathing is better with improvement in wheezing She hardly needs to use her rescue inhaler.  Given Wellbutrin at last visit but she continues to smoke.  Outpatient Encounter Medications as of 01/15/2018  Medication Sig  . albuterol (PROVENTIL HFA;VENTOLIN HFA) 108 (90 Base) MCG/ACT inhaler Inhale 2 puffs into the lungs every 6 (six) hours as needed for wheezing or shortness of breath.  Marland Kitchen BREO ELLIPTA 200-25 MCG/INH AEPB INL 1 PUFF ITL D  . buPROPion (WELLBUTRIN SR) 150 MG 12 hr tablet Take 1 tablet daily for 3 days then increased to 1 tablet twice a day.  . Cyanocobalamin (VITAMIN B-12 IJ) Inject as directed.  . diazepam (VALIUM) 5 MG  tablet Take 1 tablet (5 mg total) by mouth every 6 (six) hours as needed (MRI).  Marland Kitchen gabapentin (NEURONTIN) 300 MG capsule Take 1 capsule (300 mg total) by mouth 3 (three) times daily.  Marland Kitchen ibuprofen (ADVIL,MOTRIN) 600 MG tablet Take 1 tablet (600 mg total) by mouth every 6 (six) hours as needed.  . linaclotide (LINZESS) 290 MCG CAPS capsule Take 1 capsule (290 mcg total) by mouth daily before breakfast.  . MUCINEX D 60-600 MG 12 hr tablet Take 1 tablet by mouth 2 (two) times daily as needed. Mucous relief  . pantoprazole (PROTONIX) 40 MG tablet Take 1 tablet (40 mg total) by mouth daily.  . vitamin C (ASCORBIC ACID) 500 MG tablet Take 500 mg by mouth daily.  . Vitamin D, Ergocalciferol, (DRISDOL) 50000 units CAPS capsule Take by mouth.  . [DISCONTINUED] pantoprazole (PROTONIX) 40 MG tablet Take 1 tablet (40 mg total) by mouth 2 (two) times daily.   No facility-administered encounter medications on file as of 01/15/2018.    Physical Exam: Blood pressure 120/80, pulse 87, height 5' 6"  (1.676 m), weight 237 lb (107.5 kg), last menstrual period 12/03/2012, SpO2 98 %. Gen:      No acute distress HEENT:  EOMI, sclera anicteric Neck:     No masses; no thyromegaly Lungs:    Clear to auscultation bilaterally; normal respiratory effort CV:         Regular rate and rhythm; no murmurs Abd:      + bowel sounds; soft, non-tender; no palpable masses, no  distension Ext:    No edema; adequate peripheral perfusion Skin:      Warm and dry; no rash Neuro: alert and oriented x 3 Psych: normal mood and affect  Data Reviewed: Imaging: Chest x-ray 04/23/2017- clear lungs with no acute abnormality.  I have reviewed the images personally.  PFTs: 01/15/2018 FVC 3.19 [97%), FEV1 2.37 [7%), F/F 74, TLC 93%, DLCO 79%  Labs: CBC 12/19/2017-WBC 7.5, eos 3%, absolute eosinophil count 225 IgE 12/27/2017-3  Assessment:  COPD, asthma PFTs reviewed with no overt obstruction however there is a curvature to flow loop  suggestive of minimal small airway disease She has responded to [facility] and will continue the same  GERD, postnasal drip Continue chlorphentermine, Flonase, Protonix  Active smoker She wants to quit smoking.  Nicotine, Wellbutrin has not helped in the past. We will send in a prescription for Chantix.  Reassess at next visit.    Preop for orthopedic knee surgery Her wheezing appears to be under good control Is at low risk for perioperative complication Cleared for surgery.  Recommend continuing inhalers, early mobilization, incentive spirometer.  Plan/Recommendations: - Continue Breo - Chlorpheniramine, Flonase, Protonix - Smoking cessation with Chantix, nicotine - Cleared for knee surgery.  Marshell Garfinkel MD [facility] 01/15/2018, 12:20 PM  CC: [place], [place], *

## 2018-01-16 ENCOUNTER — Encounter: Payer: Self-pay | Admitting: Physical Therapy

## 2018-01-16 ENCOUNTER — Ambulatory Visit: Payer: Medicaid Other | Attending: Neurology | Admitting: Physical Therapy

## 2018-01-16 DIAGNOSIS — R262 Difficulty in walking, not elsewhere classified: Secondary | ICD-10-CM | POA: Diagnosis present

## 2018-01-16 DIAGNOSIS — G8929 Other chronic pain: Secondary | ICD-10-CM | POA: Diagnosis present

## 2018-01-16 DIAGNOSIS — M5442 Lumbago with sciatica, left side: Secondary | ICD-10-CM | POA: Diagnosis not present

## 2018-01-16 DIAGNOSIS — M6281 Muscle weakness (generalized): Secondary | ICD-10-CM | POA: Insufficient documentation

## 2018-01-16 NOTE — Therapy (Signed)
[facility] [address]  [place], [place], [address] Phone: [phone]   Fax:  [phone]  Physical Therapy Treatment  Patient Details  Name: Courtney Davies MRN: [id number] Date of Birth: [birth year] Referring Provider (PT): Marcial Pacas, MD   Encounter Date: 01/16/2018  PT End of Session - 01/16/18 1020    Visit Number  4    Number of Visits  10    Date for PT Re-Evaluation  02/27/18    Authorization Type  Medicaid    PT Start Time  0933    PT Stop Time  1012    PT Time Calculation (min)  39 min    Activity Tolerance  Patient tolerated treatment well;Patient limited by pain    Behavior During Therapy  [facility] for tasks assessed/performed       Past Medical History:  Diagnosis Date  . Anemia 09/2015  . Arthritis    "knees" (04/23/2017)  . Asthma    "teens; went away; came back" (04/23/2017)  . B12 deficiency anemia 04/23/2017  . Chronic bronchitis ([place])   . Chronic lower back pain   . Fatty liver   . GERD (gastroesophageal reflux disease)   . Headache    "1-2/wk" (04/23/2017)  . History of blood transfusion "plenty"   "related to anemia" (04/23/2017)  . Low back pain   . Migraine    "1-2/month" (04/23/2017)  . Symptomatic anemia 04/23/2017    Past Surgical History:  Procedure Laterality Date  . ABDOMINAL HYSTERECTOMY    . [place]; 2002  . INGUINAL HERNIA REPAIR Bilateral 1980s    "total of 4 surgeries" (04/23/2017)  . TUBAL LIGATION  2002    There were no vitals filed for this visit.  Subjective Assessment - 01/16/18 0934    Subjective  Patient reports LB is getting better with PT. R foot felt better after last session but now the pain is back and was not able to take care of it on her own with stretching and massage. Reports 50% improvement in LB since iniital eval. Able to stand and sit for longer now.     Diagnostic tests  lumbar MRI scheduled for 12/28/17    Patient Stated Goals  this pain  going away; sit, stand, walk without pain    Currently in Pain?  Yes    Pain Score  8     Pain Location  Back    Pain Orientation  Mid;Left;Right    Pain Descriptors / Indicators  Sharp;Shooting    Pain Type  Chronic pain    Pain Score  8    Pain Location  Knee   Also R foot   Pain Orientation  Left    Pain Descriptors / Indicators  Patsi Sears PT Assessment - 01/16/18 0001      Assessment   Medical Diagnosis  Chronic LBP with sciatica    Referring Provider (PT)  Marcial Pacas, MD    Onset Date/Surgical Date  12/25/16      AROM   AROM Assessment Site  Lumbar    Lumbar Flexion  mid shin   moderate LBP   Lumbar Extension  WFL   moderate pain in LB, but better than flexion   Lumbar - Right Side Bend  jt line    Lumbar - Left Side Bend  jt line    Lumbar - Right Rotation  moderately limited  Lumbar - Left Rotation  moderately limited   severe pain in LB     Strength   Strength Assessment Site  Hip;Knee;Ankle    Right/Left Hip  Right;Left    Right Hip Flexion  3+/5    Right Hip ABduction  3/5    Right Hip ADduction  4/5    Left Hip Flexion  3/5    Left Hip ABduction  3+/5   pain in L knee   Left Hip ADduction  4/5    Right Knee Flexion  4-/5    Right Knee Extension  4/5    Left Knee Flexion  3-/5    Left Knee Extension  3/5    Right/Left Ankle  Right;Left    Right Ankle Dorsiflexion  3-/5    Right Ankle Plantar Flexion  3-/5    Left Ankle Dorsiflexion  3-/5    Left Ankle Plantar Flexion  3-/5                   [facility] Adult PT Treatment/Exercise - 01/16/18 0001      Exercises   Exercises  Lumbar;Knee/Hip      Lumbar Exercises: Stretches   Gastroc Stretch  Limitations;30 seconds;3 reps    Gastroc Stretch Limitations  longsitting to tolerance      Lumbar Exercises: Standing   Other Standing Lumbar Exercises  standing lumbar extension 5x to tolerance      Lumbar Exercises: Supine   Bridge  5 reps    Bridge Limitations  unable to tolerate  d/t pain in L knee      Knee/Hip Exercises: Standing   Heel Raises  Both;1 set;15 reps    Heel Raises Limitations  at counter top      Knee/Hip Exercises: Seated   Other Seated Knee/Hip Exercises  sitting clamshell with green TB around knees 10x    Other Seated Knee/Hip Exercises  sitting adduction squeeze with ball 10x5"             PT Education - 01/16/18 1020    Education Details  update to HEP; administered red TBs    Person(s) Educated  Patient    Methods  Explanation;Demonstration;Tactile cues;Verbal cues;Handout    Comprehension  Verbalized understanding;Returned demonstration       PT Short Term Goals - 01/16/18 0937      PT SHORT TERM GOAL #1   Title  Patient to be independent with initial HEP.    Time  1    Period  Weeks    Status  Achieved        PT Long Term Goals - 01/16/18 6812      PT LONG TERM GOAL #1   Title  Patient to be independent with advanced HEP.    Time  6    Period  Weeks    Status  On-going   met for current   Target Date  02/27/18      PT LONG TERM GOAL #2   Title  Patient to demonstrate [facility] and pain <4/10 with lumbar AROM.    Time  6    Period  Weeks    Status  On-going   improvements demonstrated in lumbar flexion and R & L sidebending   Target Date  02/27/18      PT LONG TERM GOAL #3   Title  Patient to demonstrate B LE strength >=4/5.    Time  6    Period  Weeks    Status  On-going  improvements demonstrated in B hip abduction, hip adduction, L hip flexion, L knee extension strength    Target Date  02/27/18      PT LONG TERM GOAL #4   Title  Patient to report tolerance of 20 min of walking without increase in pain level.    Time  6    Period  Weeks    Status  On-going   5 min at this time   Target Date  02/27/18      PT LONG TERM GOAL #5   Title  Patient to report tolerance of 15 min of sitting without increase in pain level.    Time  6    Period  Weeks    Status  On-going   10 min at this time   Target Date   02/27/18            Plan - 01/16/18 1022    Clinical Impression Statement  Patient arrived to session with report that she has seen 50% improvement in LBP since initial eval. Notes improvements in ability to better tolerance prolonged sitting and standing. Patient however still severely limited by L knee pain from existing meniscal tear and recent R ankle pain. R ankle edematous and TTP posterior to lateral malleolus. Advised patient to continue icing R ankle and follow up with MD if pain continues. Patient reports significant difficulty completing job tasks as she works at [employer] and requires being on her feet all day. Updated goals- patient reported compliance with HEP, improvements demonstrated in lumbar flexion and R & L sidebending AROM, improvements demonstrated in B hip abduction, hip adduction, L hip flexion, L knee extension strength. Patient unable to tolerate supine bridges today d/t L knee pain; activity discontinued. Progressed LE strengthening with resisted sitting clamshells and sitting paloff press. Updated HEP with these exercises and administered handout- patient reported understanding. Patient showing slow but steady improvement towards goals which is to be expected considering multiple sites of pain. Would benefit from additional skilled PT services 1x/week for 6 weeks.     Clinical Impairments Affecting Rehab Potential  DM, B12 deficiency anemia, migraine, LBP, GERD, chronic bronchitis, asthma    PT Frequency  1x / week    PT Duration  6 weeks    PT Treatment/Interventions  ADLs/Self Care Home Management;Cryotherapy;Electrical Stimulation;Moist Heat;Traction;Ultrasound;Gait training;Stair training;Functional mobility training;Therapeutic activities;Therapeutic exercise;Manual techniques;Patient/family education;Neuromuscular re-education;Balance training;Passive range of motion;Dry needling;Energy conservation;Splinting;Taping    Consulted and Agree with Plan of Care   Patient       Patient will benefit from skilled therapeutic intervention in order to improve the following deficits and impairments:  Decreased endurance, Decreased activity tolerance, Decreased strength, Pain, Decreased balance, Decreased mobility, Difficulty walking, Abnormal gait, Decreased range of motion, Improper body mechanics, Postural dysfunction  Visit Diagnosis: Chronic left-sided low back pain with left-sided sciatica  Muscle weakness (generalized)  Difficulty in walking, not elsewhere classified     Problem List Patient Active Problem List   Diagnosis Date Noted  . 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/19/2017  . Anemia 04/23/2017  . Acute bronchitis 04/23/2017  . Chronic back pain 10/03/2015  . Symptomatic anemia 04/10/2014  . Bilateral edema of lower extremity   . Low back pain with left-sided sciatica      Janene Harvey, PT, DPT 01/16/18 10:34 AM   [facility] [address]  [place], [place], [address] Phone: [phone]   Fax:  [phone]  Name: Courtney Davies MRN: [id number] Date of Birth: 12-02-1975

## 2018-01-21 ENCOUNTER — Other Ambulatory Visit (INDEPENDENT_AMBULATORY_CARE_PROVIDER_SITE_OTHER): Payer: Self-pay | Admitting: Physician Assistant

## 2018-01-21 NOTE — Patient Instructions (Addendum)
Courtney Davies  01/21/2018   Your procedure is scheduled on: 01-30-18  Report to [facility]  Entrance  Report to admitting at 530 AM    Call this number if you have problems the morning of surgery [phone]   Remember: Do not eat food or drink liquids :After Midnight. BRUSH YOUR TEETH MORNING OF SURGERY AND RINSE YOUR MOUTH OUT, NO CHEWING GUM CANDY OR MINTS.     Take these medicines the morning of surgery with A SIP OF WATER: albuterol inhaler if needed and bring inhaler, breo inhaler, gabapentin (neurontin), predniosone, varencline (chantin), pantaprazole (protonix), toviaz, flexeril if needed                               You may not have any metal on your body including hair pins and              piercings  Do not wear jewelry, make-up, lotions, powders or perfumes, deodorant             Do not wear nail polish.  Do not shave  48 hours prior to surgery.              Men may shave face and neck.   Do not bring valuables to the hospital. [place].  Contacts, dentures or bridgework may not be worn into surgery.  Leave suitcase in the car. After surgery it may be brought to your room.     Patients discharged the day of surgery will not be allowed to drive home.  Name and phone number of your driver:mother allure greaser cell [phone]  Special Instructions: N/A              Please read over the following fact sheets you were given: _____________________________________________________________________             [facility] - Preparing for Surgery Before surgery, you can play an important role.  Because skin is not sterile, your skin needs to be as free of germs as possible.  You can reduce the number of germs on your skin by washing with CHG (chlorahexidine gluconate) soap before surgery.  CHG is an antiseptic cleaner which kills germs and bonds with the skin to continue killing germs even after  washing. Please DO NOT use if you have an allergy to CHG or antibacterial soaps.  If your skin becomes reddened/irritated stop using the CHG and inform your nurse when you arrive at Short Stay. Do not shave (including legs and underarms) for at least 48 hours prior to the first CHG shower.  You may shave your face/neck. Please follow these instructions carefully:  1.  Shower with CHG Soap the night before surgery and the  morning of Surgery.  2.  If you choose to wash your hair, wash your hair first as usual with your  normal  shampoo.  3.  After you shampoo, rinse your hair and body thoroughly to remove the  shampoo.                           4.  Use CHG as you would any other liquid soap.  You can apply chg directly  to the  skin and wash                       Gently with a scrungie or clean washcloth.  5.  Apply the CHG Soap to your body ONLY FROM THE NECK DOWN.   Do not use on face/ open                           Wound or open sores. Avoid contact with eyes, ears mouth and genitals (private parts).                       Wash face,  Genitals (private parts) with your normal soap.             6.  Wash thoroughly, paying special attention to the area where your surgery  will be performed.  7.  Thoroughly rinse your body with warm water from the neck down.  8.  DO NOT shower/wash with your normal soap after using and rinsing off  the CHG Soap.                9.  Pat yourself dry with a clean towel.            10.  Wear clean pajamas.            11.  Place clean sheets on your bed the night of your first shower and do not  sleep with pets. Day of Surgery : Do not apply any lotions/deodorants the morning of surgery.  Please wear clean clothes to the hospital/surgery center.  FAILURE TO FOLLOW THESE INSTRUCTIONS MAY RESULT IN THE CANCELLATION OF YOUR SURGERY PATIENT SIGNATURE_________________________________  NURSE  SIGNATURE__________________________________  ________________________________________________________________________  WHAT IS A BLOOD TRANSFUSION? Blood Transfusion Information  A transfusion is the replacement of blood or some of its parts. Blood is made up of multiple cells which provide different functions.  Red blood cells carry oxygen and are used for blood loss replacement.  White blood cells fight against infection.  Platelets control bleeding.  Plasma helps clot blood.  Other blood products are available for specialized needs, such as hemophilia or other clotting disorders. BEFORE THE TRANSFUSION  Who gives blood for transfusions?   Healthy volunteers who are fully evaluated to make sure their blood is safe. This is blood bank blood. Transfusion therapy is the safest it has ever been in the practice of medicine. Before blood is taken from a donor, a complete history is taken to make sure that person has no history of diseases nor engages in risky social behavior (examples are intravenous drug use or sexual activity with multiple partners). The donor's travel history is screened to minimize risk of transmitting infections, such as malaria. The donated blood is tested for signs of infectious diseases, such as HIV and hepatitis. The blood is then tested to be sure it is compatible with you in order to minimize the chance of a transfusion reaction. If you or a relative donates blood, this is often done in anticipation of surgery and is not appropriate for emergency situations. It takes many days to process the donated blood. RISKS AND COMPLICATIONS Although transfusion therapy is very safe and saves many lives, the main dangers of transfusion include:   Getting an infectious disease.  Developing a transfusion reaction. This is an allergic reaction to something in the blood you were given. Every precaution is taken to prevent this. The  decision to have a blood transfusion has been  considered carefully by your caregiver before blood is given. Blood is not given unless the benefits outweigh the risks. AFTER THE TRANSFUSION  Right after receiving a blood transfusion, you will usually feel much better and more energetic. This is especially true if your red blood cells have gotten low (anemic). The transfusion raises the level of the red blood cells which carry oxygen, and this usually causes an energy increase.  The nurse administering the transfusion will monitor you carefully for complications. HOME CARE INSTRUCTIONS  No special instructions are needed after a transfusion. You may find your energy is better. Speak with your caregiver about any limitations on activity for underlying diseases you may have. SEEK MEDICAL CARE IF:   Your condition is not improving after your transfusion.  You develop redness or irritation at the intravenous (IV) site. SEEK IMMEDIATE MEDICAL CARE IF:  Any of the following symptoms occur over the next 12 hours:  Shaking chills.  You have a temperature by mouth above 102 F (38.9 C), not controlled by medicine.  Chest, back, or muscle pain.  People around you feel you are not acting correctly or are confused.  Shortness of breath or difficulty breathing.  Dizziness and fainting.  You get a rash or develop hives.  You have a decrease in urine output.  Your urine turns a dark color or changes to pink, red, or brown. Any of the following symptoms occur over the next 10 days:  You have a temperature by mouth above 102 F (38.9 C), not controlled by medicine.  Shortness of breath.  Weakness after normal activity.  The white part of the eye turns yellow (jaundice).  You have a decrease in the amount of urine or are urinating less often.  Your urine turns a dark color or changes to pink, red, or brown. Document Released: 03/30/2000 Document Revised: 06/25/2011 Document Reviewed: 11/17/2007 [facility] Patient Information 2014  [place], [place].  _______________________________________________________________________

## 2018-01-21 NOTE — Progress Notes (Signed)
Need orders for 10-17 surgery in epic, pre op is 10-10

## 2018-01-21 NOTE — Progress Notes (Addendum)
lov pulmonary 01-15-18 epic Pulmonary function test 01-15-18 epic ekg 04-23-17 epic Chest xray 1 view 04-23-17 epic hemaglobin a1c 12-19-17 epic

## 2018-01-23 ENCOUNTER — Encounter (HOSPITAL_COMMUNITY): Payer: Self-pay

## 2018-01-23 ENCOUNTER — Encounter (HOSPITAL_COMMUNITY)
Admission: RE | Admit: 2018-01-23 | Discharge: 2018-01-23 | Disposition: A | Discharge status: Home / Self Care | Payer: Medicaid Other | Source: Ambulatory Visit | Attending: Orthopaedic Surgery | Admitting: Orthopaedic Surgery

## 2018-01-23 ENCOUNTER — Ambulatory Visit: Payer: Medicaid Other | Admitting: Physical Therapy

## 2018-01-23 ENCOUNTER — Other Ambulatory Visit: Payer: Self-pay

## 2018-01-23 DIAGNOSIS — Z01812 Encounter for preprocedural laboratory examination: Secondary | ICD-10-CM | POA: Insufficient documentation

## 2018-01-23 HISTORY — DX: Unilateral inguinal hernia, without obstruction or gangrene, not specified as recurrent: K40.90

## 2018-01-23 LAB — CBC
HEMATOCRIT: 42.6 % (ref 36.0–46.0)
Hemoglobin: 13.1 g/dL (ref 12.0–15.0)
MCH: 23 pg — ABNORMAL LOW (ref 26.0–34.0)
MCHC: 30.8 g/dL (ref 30.0–36.0)
MCV: 74.9 fL — AB (ref 80.0–100.0)
NRBC: 0 % (ref 0.0–0.2)
Platelets: 330 10*3/uL (ref 150–400)
RBC: 5.69 MIL/uL — ABNORMAL HIGH (ref 3.87–5.11)
RDW: 16.8 % — AB (ref 11.5–15.5)
WBC: 8.6 10*3/uL (ref 4.0–10.5)

## 2018-01-23 LAB — COMPREHENSIVE METABOLIC PANEL
ALT: 15 U/L (ref 0–44)
ANION GAP: 10 (ref 5–15)
AST: 18 U/L (ref 15–41)
Albumin: 3.9 g/dL (ref 3.5–5.0)
Alkaline Phosphatase: 113 U/L (ref 38–126)
BUN: 12 mg/dL (ref 6–20)
CHLORIDE: 106 mmol/L (ref 98–111)
CO2: 26 mmol/L (ref 22–32)
Calcium: 9.5 mg/dL (ref 8.9–10.3)
Creatinine, Ser: 0.69 mg/dL (ref 0.44–1.00)
GFR calc Af Amer: >60 mL/min (ref >60)
GFR calc non Af Amer: >60 mL/min (ref >60)
GLUCOSE: 132 mg/dL — AB (ref 70–99)
POTASSIUM: 4.1 mmol/L (ref 3.5–5.1)
Sodium: 142 mmol/L (ref 135–145)
TOTAL PROTEIN: 7.7 g/dL (ref 6.5–8.1)
Total Bilirubin: 0.5 mg/dL (ref 0.3–1.2)

## 2018-01-29 NOTE — Anesthesia Preprocedure Evaluation (Addendum)
Anesthesia Evaluation  Patient identified by MRN, date of birth, ID band Patient awake    Reviewed: Allergy & Precautions, H&P , NPO status , Patient's Chart, lab work & pertinent test results  Airway Mallampati: II  TM Distance: >3 FB Neck ROM: Full    Dental no notable dental hx. (+) Teeth Intact, Dental Advisory Given   Pulmonary asthma , Current Smoker,    Pulmonary exam normal breath sounds clear to auscultation       Cardiovascular Normal cardiovascular exam+ Valvular Problems/Murmurs MR  Rhythm:Regular Rate:Normal  Echo 04/11/2014 Left ventricle: The cavity size was normal. Systolic function was normal. The estimated ejection fraction was in the range of 55% to 60%. Wall motion was normal; there were no regional wall motion abnormalities. - Mitral valve: There was moderate regurgitation. - Left atrium: The atrium was moderately to severely dilated.   Neuro/Psych  Headaches,    GI/Hepatic GERD  Medicated,  Endo/Other    Renal/GU      Musculoskeletal  (+) Arthritis ,   Abdominal (+) + obese,   Peds  Hematology  (+) Blood dyscrasia, anemia ,   Anesthesia Other Findings   Reproductive/Obstetrics Hysterectomy                           Anesthesia Physical Anesthesia Plan  ASA: II  Anesthesia Plan: General   Post-op Pain Management:    Induction: Intravenous  PONV Risk Score and Plan: 2 and Treatment may vary due to age or medical condition, Ondansetron and Dexamethasone  Airway Management Planned: LMA  Additional Equipment:   Intra-op Plan:   Post-operative Plan:   Informed Consent: I have reviewed the patients History and Physical, chart, labs and discussed the procedure including the risks, benefits and alternatives for the proposed anesthesia with the patient or authorized representative who has indicated his/her understanding and acceptance.   Dental advisory  given  Plan Discussed with: CRNA  Anesthesia Plan Comments:        Anesthesia Quick Evaluation

## 2018-01-30 ENCOUNTER — Ambulatory Visit (HOSPITAL_COMMUNITY): Payer: Medicaid Other | Admitting: Anesthesiology

## 2018-01-30 ENCOUNTER — Encounter: Payer: Medicaid Other | Admitting: Physical Therapy

## 2018-01-30 ENCOUNTER — Encounter (HOSPITAL_COMMUNITY): Payer: Self-pay | Admitting: Emergency Medicine

## 2018-01-30 ENCOUNTER — Ambulatory Visit (HOSPITAL_COMMUNITY)
Admission: RE | Admit: 2018-01-30 | Discharge: 2018-01-30 | Disposition: A | Discharge status: Home / Self Care | Payer: Medicaid Other | Source: Ambulatory Visit | Attending: Orthopaedic Surgery | Admitting: Orthopaedic Surgery

## 2018-01-30 ENCOUNTER — Encounter (HOSPITAL_COMMUNITY)
Admission: RE | Disposition: A | Discharge status: Home / Self Care | Payer: Self-pay | Source: Ambulatory Visit | Attending: Orthopaedic Surgery

## 2018-01-30 DIAGNOSIS — K219 Gastro-esophageal reflux disease without esophagitis: Secondary | ICD-10-CM | POA: Insufficient documentation

## 2018-01-30 DIAGNOSIS — S83242D Other tear of medial meniscus, current injury, left knee, subsequent encounter: Secondary | ICD-10-CM

## 2018-01-30 DIAGNOSIS — X58XXXA Exposure to other specified factors, initial encounter: Secondary | ICD-10-CM | POA: Insufficient documentation

## 2018-01-30 DIAGNOSIS — S83282A Other tear of lateral meniscus, current injury, left knee, initial encounter: Secondary | ICD-10-CM | POA: Insufficient documentation

## 2018-01-30 DIAGNOSIS — D519 Vitamin B12 deficiency anemia, unspecified: Secondary | ICD-10-CM | POA: Diagnosis not present

## 2018-01-30 DIAGNOSIS — J45909 Unspecified asthma, uncomplicated: Secondary | ICD-10-CM | POA: Diagnosis not present

## 2018-01-30 DIAGNOSIS — M94262 Chondromalacia, left knee: Secondary | ICD-10-CM | POA: Insufficient documentation

## 2018-01-30 DIAGNOSIS — F17210 Nicotine dependence, cigarettes, uncomplicated: Secondary | ICD-10-CM | POA: Diagnosis not present

## 2018-01-30 DIAGNOSIS — Z79899 Other long term (current) drug therapy: Secondary | ICD-10-CM | POA: Insufficient documentation

## 2018-01-30 DIAGNOSIS — M948X6 Other specified disorders of cartilage, lower leg: Secondary | ICD-10-CM | POA: Insufficient documentation

## 2018-01-30 DIAGNOSIS — M1990 Unspecified osteoarthritis, unspecified site: Secondary | ICD-10-CM | POA: Diagnosis not present

## 2018-01-30 HISTORY — PX: KNEE ARTHROSCOPY WITH MEDIAL MENISECTOMY: SHX5651

## 2018-01-30 SURGERY — ARTHROSCOPY, KNEE, WITH MEDIAL MENISCECTOMY
Anesthesia: General | Site: Knee | Laterality: Left

## 2018-01-30 MED ORDER — LIDOCAINE 2% (20 MG/ML) 5 ML SYRINGE
INTRAMUSCULAR | Status: DC | PRN
  Administered 2018-01-30: 100 mg via INTRAVENOUS

## 2018-01-30 MED ORDER — HYDROCODONE-ACETAMINOPHEN 7.5-325 MG PO TABS
ORAL_TABLET | ORAL | Status: AC
  Filled 2018-01-30: qty 1

## 2018-01-30 MED ORDER — MORPHINE SULFATE (PF) 4 MG/ML IV SOLN
INTRAVENOUS | Status: DC | PRN
  Administered 2018-01-30: 4 mg

## 2018-01-30 MED ORDER — CEFAZOLIN SODIUM-DEXTROSE 2-4 GM/100ML-% IV SOLN
2.0000 g | INTRAVENOUS | Status: AC
  Administered 2018-01-30: 2 g via INTRAVENOUS
  Filled 2018-01-30: qty 100

## 2018-01-30 MED ORDER — CHLORHEXIDINE GLUCONATE 4 % EX LIQD: 60.0000 mL | Freq: Once | CUTANEOUS | Status: DC

## 2018-01-30 MED ORDER — ONDANSETRON HCL 4 MG/2ML IJ SOLN
INTRAMUSCULAR | Status: AC
  Filled 2018-01-30: qty 2

## 2018-01-30 MED ORDER — ACETAMINOPHEN 500 MG PO TABS
1000.0000 mg | ORAL_TABLET | Freq: Once | ORAL | Status: AC
  Administered 2018-01-30: 1000 mg via ORAL
  Filled 2018-01-30: qty 2

## 2018-01-30 MED ORDER — FENTANYL CITRATE (PF) 100 MCG/2ML IJ SOLN
INTRAMUSCULAR | Status: AC
  Filled 2018-01-30: qty 2

## 2018-01-30 MED ORDER — MORPHINE SULFATE (PF) 4 MG/ML IV SOLN
INTRAVENOUS | Status: AC
  Filled 2018-01-30: qty 1

## 2018-01-30 MED ORDER — HYDROMORPHONE HCL 1 MG/ML IJ SOLN
0.2500 mg | INTRAMUSCULAR | Status: DC | PRN
  Administered 2018-01-30 (×3): 0.25 mg via INTRAVENOUS

## 2018-01-30 MED ORDER — PROPOFOL 10 MG/ML IV BOLUS
INTRAVENOUS | Status: DC | PRN
  Administered 2018-01-30: 200 mg via INTRAVENOUS

## 2018-01-30 MED ORDER — LIP MEDEX EX OINT
TOPICAL_OINTMENT | CUTANEOUS | Status: AC
  Filled 2018-01-30: qty 7

## 2018-01-30 MED ORDER — FENTANYL CITRATE (PF) 100 MCG/2ML IJ SOLN
INTRAMUSCULAR | Status: DC | PRN
  Administered 2018-01-30 (×4): 50 ug via INTRAVENOUS

## 2018-01-30 MED ORDER — LIDOCAINE 2% (20 MG/ML) 5 ML SYRINGE
INTRAMUSCULAR | Status: AC
  Filled 2018-01-30: qty 5

## 2018-01-30 MED ORDER — DEXAMETHASONE SODIUM PHOSPHATE 10 MG/ML IJ SOLN
INTRAMUSCULAR | Status: AC
  Filled 2018-01-30: qty 1

## 2018-01-30 MED ORDER — MIDAZOLAM HCL 2 MG/2ML IJ SOLN
INTRAMUSCULAR | Status: AC
  Filled 2018-01-30: qty 2

## 2018-01-30 MED ORDER — PROMETHAZINE HCL 25 MG/ML IJ SOLN: 6.2500 mg | INTRAMUSCULAR | Status: DC | PRN

## 2018-01-30 MED ORDER — HYDROMORPHONE HCL 1 MG/ML IJ SOLN
INTRAMUSCULAR | Status: AC
  Administered 2018-01-30: 0.25 mg via INTRAVENOUS
  Filled 2018-01-30: qty 1

## 2018-01-30 MED ORDER — GABAPENTIN 300 MG PO CAPS
300.0000 mg | ORAL_CAPSULE | Freq: Once | ORAL | Status: AC
  Administered 2018-01-30: 300 mg via ORAL
  Filled 2018-01-30: qty 1

## 2018-01-30 MED ORDER — DEXMEDETOMIDINE HCL IN NACL 200 MCG/50ML IV SOLN
INTRAVENOUS | Status: AC
  Filled 2018-01-30: qty 50

## 2018-01-30 MED ORDER — ACETAMINOPHEN 10 MG/ML IV SOLN: 1000.0000 mg | Freq: Once | INTRAVENOUS | Status: DC | PRN

## 2018-01-30 MED ORDER — ONDANSETRON HCL 4 MG/2ML IJ SOLN
INTRAMUSCULAR | Status: DC | PRN
  Administered 2018-01-30: 4 mg via INTRAVENOUS

## 2018-01-30 MED ORDER — DEXMEDETOMIDINE HCL 200 MCG/2ML IV SOLN
INTRAVENOUS | Status: DC | PRN
  Administered 2018-01-30: 20 ug via INTRAVENOUS

## 2018-01-30 MED ORDER — BUPIVACAINE HCL (PF) 0.5 % IJ SOLN
INTRAMUSCULAR | Status: AC
  Filled 2018-01-30: qty 30

## 2018-01-30 MED ORDER — OXYCODONE HCL 5 MG PO TABS: 5.0000 mg | ORAL_TABLET | ORAL | 0 refills | Status: AC | PRN

## 2018-01-30 MED ORDER — HYDROCODONE-ACETAMINOPHEN 7.5-325 MG PO TABS: 1.0000 | ORAL_TABLET | Freq: Once | ORAL | Status: DC | PRN

## 2018-01-30 MED ORDER — BUPIVACAINE HCL (PF) 0.5 % IJ SOLN
INTRAMUSCULAR | Status: DC | PRN
  Administered 2018-01-30: 30 mL

## 2018-01-30 MED ORDER — MIDAZOLAM HCL 5 MG/5ML IJ SOLN
INTRAMUSCULAR | Status: DC | PRN
  Administered 2018-01-30: 2 mg via INTRAVENOUS

## 2018-01-30 MED ORDER — PROPOFOL 10 MG/ML IV BOLUS
INTRAVENOUS | Status: AC
  Filled 2018-01-30: qty 20

## 2018-01-30 MED ORDER — LACTATED RINGERS IV SOLN
INTRAVENOUS | Status: DC
  Administered 2018-01-30: 06:00:00 via INTRAVENOUS

## 2018-01-30 MED ORDER — DEXAMETHASONE SODIUM PHOSPHATE 10 MG/ML IJ SOLN
INTRAMUSCULAR | Status: DC | PRN
  Administered 2018-01-30: 4 mg via INTRAVENOUS

## 2018-01-30 MED ORDER — MEPERIDINE HCL 50 MG/ML IJ SOLN: 6.2500 mg | INTRAMUSCULAR | Status: DC | PRN

## 2018-01-30 SURGICAL SUPPLY — 27 items
BANDAGE ACE 6X5 VEL STRL LF (GAUZE/BANDAGES/DRESSINGS) IMPLANT
BANDAGE ELASTIC 6 VELCRO ST LF (GAUZE/BANDAGES/DRESSINGS) ×3 IMPLANT
BLADE CUDA SHAVER 3.5 (BLADE) ×3 IMPLANT
BNDG GAUZE ELAST 4 BULKY (GAUZE/BANDAGES/DRESSINGS) ×3 IMPLANT
COVER SURGICAL LIGHT HANDLE (MISCELLANEOUS) ×3 IMPLANT
COVER WAND RF STERILE (DRAPES) ×3 IMPLANT
DRAPE U-SHAPE 47X51 STRL (DRAPES) ×3 IMPLANT
DRSG PAD ABDOMINAL 8X10 ST (GAUZE/BANDAGES/DRESSINGS) ×3 IMPLANT
DURAPREP 26ML APPLICATOR (WOUND CARE) ×3 IMPLANT
GAUZE SPONGE 4X4 12PLY STRL (GAUZE/BANDAGES/DRESSINGS) ×3 IMPLANT
GAUZE XEROFORM 1X8 LF (GAUZE/BANDAGES/DRESSINGS) ×3 IMPLANT
GLOVE BIO SURGEON STRL SZ7.5 (GLOVE) ×3 IMPLANT
GLOVE BIOGEL PI IND STRL 8 (GLOVE) ×2 IMPLANT
GLOVE BIOGEL PI INDICATOR 8 (GLOVE) ×4
GLOVE ECLIPSE 8.0 STRL XLNG CF (GLOVE) ×3 IMPLANT
GOWN STRL REUS W/TWL XL LVL3 (GOWN DISPOSABLE) ×6 IMPLANT
KIT BASIN OR (CUSTOM PROCEDURE TRAY) IMPLANT
MANIFOLD NEPTUNE II (INSTRUMENTS) ×3 IMPLANT
PACK ARTHROSCOPY WL (CUSTOM PROCEDURE TRAY) ×3 IMPLANT
PADDING CAST COTTON 6X4 STRL (CAST SUPPLIES) ×3 IMPLANT
POSITIONER SURGICAL ARM (MISCELLANEOUS) ×3 IMPLANT
SUT ETHILON 4 0 PS 2 18 (SUTURE) ×3 IMPLANT
SYR CONTROL 10ML LL (SYRINGE) ×3 IMPLANT
TOWEL OR 17X26 10 PK STRL BLUE (TOWEL DISPOSABLE) ×3 IMPLANT
TUBING ARTHRO INFLOW-ONLY STRL (TUBING) ×3 IMPLANT
WAND HAND CNTRL MULTIVAC 90 (MISCELLANEOUS) IMPLANT
WRAP KNEE MAXI GEL POST OP (GAUZE/BANDAGES/DRESSINGS) ×3 IMPLANT

## 2018-01-30 NOTE — H&P (Signed)
Courtney Davies is an 42 y.o. female.   Chief Complaint:   Left knee pain with swelling, locking, and catching HPI:   42 yo female with continued left knee pain and swelling with a known meniscal tear.  Failed conservative treatment.  Past Medical History:  Diagnosis Date  . Anemia 09/2015  . Arthritis    "knees" (04/23/2017)  . Asthma    "teens; went away; came back" (04/23/2017)  . B12 deficiency anemia 04/23/2017  . Chronic bronchitis ([place])   . Chronic lower back pain   . Fatty liver   . GERD (gastroesophageal reflux disease)   . Headache    "1-2/wk" (04/23/2017)  . History of blood transfusion "plenty"   "related to anemia" (04/23/2017)  . Inguinal hernia   . Low back pain   . Migraine    "1-2/month" (04/23/2017)  . Symptomatic anemia 04/23/2017    Past Surgical History:  Procedure Laterality Date  . ABDOMINAL HYSTERECTOMY  YRS AGO   COMPLETE  . [place]; 2002  . INGUINAL HERNIA REPAIR Bilateral 1980s    "total of 4 surgeries" (04/23/2017)  . TUBAL LIGATION  2002    Family History  Problem Relation Age of Onset  . Anemia Paternal Grandmother   . Valvular heart disease Paternal Grandmother   . Diabetes Mother   . Hypertension Mother   . Diabetes Maternal Grandmother   . Prostate cancer Maternal Uncle        ? intestinal also  . Diabetes Father    Social History:  reports that she has been smoking cigarettes. She has a 12.50 pack-year smoking history. She has never used smokeless tobacco. She reports that she drinks alcohol. She reports that she has current or past drug history. Drug: Marijuana.  Allergies:  Allergies  Allergen Reactions  . Other Other (See Comments), Anaphylaxis, Swelling and Hives    Hair glue causes throat to close and hives "glue" Hair glue causes throat to close   . Latex Hives  . Codeine Nausea And Vomiting  . Nitrofurantoin Rash    Medications Prior to Admission  Medication Sig Dispense Refill  . BREO ELLIPTA 200-25 MCG/INH  AEPB Inhale 1 puff into the lungs daily.   5  . cyclobenzaprine (FLEXERIL) 5 MG tablet Take 5 mg by mouth at bedtime.    . gabapentin (NEURONTIN) 300 MG capsule Take 1 capsule (300 mg total) by mouth 3 (three) times daily. 90 capsule 11  . ibuprofen (ADVIL,MOTRIN) 800 MG tablet Take 800 mg by mouth 3 (three) times daily.    Marland Kitchen linaclotide (LINZESS) 290 MCG CAPS capsule Take 1 capsule (290 mcg total) by mouth daily before breakfast. 90 capsule 3  . pantoprazole (PROTONIX) 40 MG tablet Take 1 tablet (40 mg total) by mouth daily. (Patient taking differently: Take 40 mg by mouth 2 (two) times daily. ) 90 tablet 3  . predniSONE (DELTASONE) 20 MG tablet Take 20 mg by mouth daily with breakfast.    . TOVIAZ 4 MG TB24 tablet Take 4 mg by mouth daily.  5  . varenicline (CHANTIX CONTINUING MONTH PAK) 1 MG tablet Take 1 tablet (1 mg total) by mouth 2 (two) times daily. 60 tablet 0  . varenicline (CHANTIX STARTING MONTH PAK) 0.5 MG X 11 & 1 MG X 42 tablet Take one 0.5 mg tablet by mouth once daily for 3 days, then increase to one 0.5 mg tablet twice daily for 4 days, then increase to one 1 mg tablet twice  daily. 53 tablet 0  . albuterol (PROVENTIL HFA;VENTOLIN HFA) 108 (90 Base) MCG/ACT inhaler Inhale 2 puffs into the lungs every 6 (six) hours as needed for wheezing or shortness of breath. 1 Inhaler 2  . buPROPion (WELLBUTRIN SR) 150 MG 12 hr tablet Take 1 tablet daily for 3 days then increased to 1 tablet twice a day. (Patient not taking: Reported on 01/20/2018) 30 tablet 2  . Cyanocobalamin (VITAMIN B-12 IJ) Inject 100 mcg as directed 2 (two) times a week.     . diazepam (VALIUM) 5 MG tablet Take 1 tablet (5 mg total) by mouth every 6 (six) hours as needed (MRI). (Patient not taking: Reported on 01/20/2018) 3 tablet 0  . folic acid (FOLVITE) 1 MG tablet Take 1 mg by mouth daily.    Marland Kitchen ibuprofen (ADVIL,MOTRIN) 600 MG tablet Take 1 tablet (600 mg total) by mouth every 6 (six) hours as needed. (Patient not taking:  Reported on 01/20/2018) 30 tablet 0  . vitamin C (ASCORBIC ACID) 500 MG tablet Take 500 mg by mouth daily.    . Vitamin D, Ergocalciferol, (DRISDOL) 50000 units CAPS capsule Take 50,000 Units by mouth every Thursday.       No results found for this or any previous visit (from the past 48 hour(s)). No results found.  Review of Systems  Musculoskeletal: Positive for joint pain.  All other systems reviewed and are negative.   Blood pressure 136/78, pulse 63, temperature 98.4 F (36.9 C), temperature source Oral, resp. rate 16, height 5' 6"  (1.676 m), weight 107.5 kg, last menstrual period 12/03/2012, SpO2 99 %. Physical Exam  Constitutional: She is oriented to person, place, and time. She appears well-developed and well-nourished.  HENT:  Head: Normocephalic and atraumatic.  Eyes: Pupils are equal, round, and reactive to light. EOM are normal.  Neck: Normal range of motion. Neck supple.  Cardiovascular: Normal rate and regular rhythm.  Respiratory: Effort normal and breath sounds normal.  GI: Soft. Bowel sounds are normal.  Musculoskeletal:       Left knee: She exhibits decreased range of motion, swelling and abnormal meniscus. Tenderness found. Medial joint line and lateral joint line tenderness noted.  Neurological: She is alert and oriented to person, place, and time.  Skin: Skin is warm and dry.  Psychiatric: She has a normal mood and affect.     Assessment/Plan Left knee with symptomatic meniscal tear  To the OR today for a left knee arthroscopy with debridement and a partial meniscectomy.  Mcarthur Rossetti, MD 01/30/2018, 7:24 AM

## 2018-01-30 NOTE — Discharge Instructions (Signed)
You may increase your activities as comfort allows. You may put all of your weight on your left leg. Expect swelling - ice and elevation as needed. Pump your feet occasionally during the day. You can remove all of your dressings in 24 hours and get your incisions wet daily. Place band-aids on your incisions daily.

## 2018-01-30 NOTE — Brief Op Note (Signed)
01/30/2018  8:04 AM  PATIENT:  Courtney Davies  42 y.o. female  PRE-OPERATIVE DIAGNOSIS:  left knee lateral meniscal tear  POST-OPERATIVE DIAGNOSIS:  left lateral meniscal tear  PROCEDURE:  Procedure(s): LEFT KNEE ARTHROSCOPY WITH PARTIAL LATERAL MENISCECTOMY (Left)  SURGEON:  Surgeon(s) and Role:    Mcarthur Rossetti, MD - Primary  PHYSICIAN ASSISTANT: Benita Stabile, PA-C  ANESTHESIA:   local and general  COUNTS:  YES  DICTATION: .Other Dictation: Dictation Number [id number]  PLAN OF CARE: Discharge to home after PACU  PATIENT DISPOSITION:  PACU - hemodynamically stable.   Delay start of Pharmacological VTE agent (>24hrs) due to surgical blood loss or risk of bleeding: no

## 2018-01-30 NOTE — Transfer of Care (Signed)
Immediate Anesthesia Transfer of Care Note  Patient: Courtney Davies  Procedure(s) Performed: LEFT KNEE ARTHROSCOPY WITH PARTIAL LATERAL MENISCECTOMY (Left Knee)  Patient Location: PACU  Anesthesia Type:General  Level of Consciousness: sedated  Airway & Oxygen Therapy: Patient Spontanous Breathing and Patient connected to face mask oxygen  Post-op Assessment: Report given to RN and Post -op Vital signs reviewed and stable  Post vital signs: Reviewed and stable  Last Vitals:  Vitals Value Taken Time  BP    Temp    Pulse 94 01/30/2018  8:09 AM  Resp 15 01/30/2018  8:09 AM  SpO2 99 % 01/30/2018  8:09 AM  Vitals shown include unvalidated device data.  Last Pain:  Vitals:   01/30/18 0625  TempSrc: Oral  PainSc:       Patients Stated Pain Goal: 4 (56/38/75 6433)  Complications: No apparent anesthesia complications

## 2018-01-30 NOTE — Anesthesia Postprocedure Evaluation (Signed)
Anesthesia Post Note  Patient: Courtney Davies  Procedure(s) Performed: LEFT KNEE ARTHROSCOPY WITH PARTIAL LATERAL MENISCECTOMY (Left Knee)     Patient location during evaluation: PACU Anesthesia Type: General Level of consciousness: awake and alert Pain management: pain level controlled Vital Signs Assessment: post-procedure vital signs reviewed and stable Respiratory status: spontaneous breathing, nonlabored ventilation, respiratory function stable and patient connected to nasal cannula oxygen Cardiovascular status: blood pressure returned to baseline and stable Postop Assessment: no apparent nausea or vomiting Anesthetic complications: no    Last Vitals:  Vitals:   01/30/18 0915 01/30/18 0933  BP: (!) 135/94 (!) 137/92  Pulse:    Resp: 18 16  Temp: 36.4 C 36.7 C  SpO2: 100% 100%    Last Pain:  Vitals:   01/30/18 0915  TempSrc:   PainSc: 4                  [place]et [place]

## 2018-01-30 NOTE — Anesthesia Procedure Notes (Signed)
Procedure Name: LMA Insertion Date/Time: 01/30/2018 7:32 AM Performed by: Lind Covert, CRNA Pre-anesthesia Checklist: Patient identified, Emergency Drugs available, Suction available, Patient being monitored and Timeout performed Patient Re-evaluated:Patient Re-evaluated prior to induction Oxygen Delivery Method: Circle system utilized Preoxygenation: Pre-oxygenation with 100% oxygen Induction Type: IV induction LMA: LMA inserted LMA Size: 4.0 Tube type: Oral Number of attempts: 1 Tube secured with: Tape Dental Injury: Teeth and Oropharynx as per pre-operative assessment

## 2018-01-30 NOTE — Op Note (Signed)
NAMEKEANU, FRICKEY MEDICAL RECORD [id number] ACCOUNT [id number] DATE OF BIRTH:Jul 28, 1975 FACILITY: [facility] LOCATION: [facility] PHYSICIAN:CHRISTOPHER Kerry Fort, MD  OPERATIVE REPORT  DATE OF PROCEDURE:  01/30/2018  PREOPERATIVE DIAGNOSES: 1.  Symptomatic left knee lateral meniscal tear. 2.  Partial thickness cartilage loss, lateral compartment, left knee.  POSTOPERATIVE DIAGNOSES:   1.  Symptomatic left knee lateral meniscal tear. 2.  Partial thickness cartilage loss, lateral compartment, left knee.  PROCEDURE:  Left knee arthroscopy with debridement and partial lateral meniscectomy.  FINDINGS: 1.  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 2.  Anterior horn to midbody lateral meniscal tear.  SURGEON:  Lind Guest. Ninfa Linden, MD  ASSISTANT:  Erskine Emery, PA-C  ANESTHESIA: 1.  General. 2.  Local with a mixture of morphine and plain Marcaine.  ESTIMATED BLOOD LOSS:  Minimal.  COMPLICATIONS:  None.  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  She had a symptomatic lateral meniscal tear.  This has been seen on MRI.  She has tried and failed all forms of conservative treatment.   She has a very low pain threshold as well.  At this point, we have recommended arthroscopic intervention as well as activity modification and weight loss as well as quad strengthening exercises.  An MRI did confirm an anterior horn to midbody medial  meniscal tear, and she was symptomatic from this.  DESCRIPTION OF PROCEDURE:  After informed consent was obtained and appropriate left knee was marked, she was brought to the operating room and placed supine on the operating table.  General anesthesia was then obtained.  Her left thigh, knee, leg and  ankle were prepped and draped with DuraPrep and sterile drapes including a sterile stockinette with the bed raised and a lateral leg post  utilized.  The left operative knee was flexed off the side table.  A time-out was called, and she was identified as  correct patient, correct left knee.  We then made an anterior lateral arthroscopy portal and inserted a cannula in the knee and drained.  No significant effusion from the knee.  We placed the camera into the knee and went to the medial compartment and  made an anterior medial incision.  We found the medial compartment to be normal with normal cartilage and normal meniscus.  The ACL and PCL were also intact.  Assessing the lateral compartment, there was a flap tear of the anterior horn of the meniscus.   Using an arthroscopic shaver, we were able to debride this back to a stable margin, performing a partial lateral meniscectomy.  We then brought the knee in a figure-of-four position.  We did not find any full-thickness cartilage loss in the knee, but we  did find at least grade III chondromalacia of the lateral femoral condyle and lateral tibial plateau, and this was debrided back to a stable margin.  We did find a midbody meniscal tear which was a more free edge tear, and we debrided this.  The  remainder of her posterior horn and midbody of the lateral meniscus was intact.  Finally, the patellofemoral joint was assessed and found to have just grade II chondromalacia.  We then allowed fluid lavage of the knee and drained all the fluid from the  knee.  We closed the portal sites with interrupted nylon suture and inserted Marcaine and morphine into the knee joint and the portal sites.  A well-padded sterile dressing was applied.  She was awakened, extubated, and taken to recovery room in stable  condition.  All final counts were correct.  There were no complications noted.  Postoperatively, we will let her increase her activity as comfort allows.  Followup in the office will be in a week.  LN/NUANCE  D:01/30/2018 T:01/30/2018 JOB:[id number]

## 2018-01-31 ENCOUNTER — Encounter (HOSPITAL_COMMUNITY): Payer: Self-pay | Admitting: Orthopaedic Surgery

## 2018-02-06 ENCOUNTER — Encounter (INDEPENDENT_AMBULATORY_CARE_PROVIDER_SITE_OTHER): Payer: Self-pay | Admitting: Orthopaedic Surgery

## 2018-02-06 ENCOUNTER — Ambulatory Visit (INDEPENDENT_AMBULATORY_CARE_PROVIDER_SITE_OTHER): Payer: Medicaid Other | Admitting: Orthopaedic Surgery

## 2018-02-06 ENCOUNTER — Ambulatory Visit: Payer: Medicaid Other | Admitting: Physical Therapy

## 2018-02-06 DIAGNOSIS — Z9889 Other specified postprocedural states: Secondary | ICD-10-CM

## 2018-02-06 NOTE — Progress Notes (Signed)
The patient is returning 1 week after a left knee arthroscopy with a partial lateral meniscectomy.  She is only 42 years old.  We did find grade 2 to grade 3 changes of the lateral compartment of her knee both at the femoral condyle and the lateral tibial plateau.  There was a macerated meniscal tear that we debrided back to stable margin.  She is doing well postoperatively.  She says have some stiffness and soreness with her knee with swelling.  On exam she does have a moderate effusion postoperatively.  I did remove the sutures.  Both knees have valgus malalignment.  She is not interested in having fluid drained from her knees that she is an alternate ice and heat.  I did show her quad strengthening exercises and counseled her on weight loss given her obesity.  We will see her back in 4 weeks to see how she is doing overall.  All question concerns were answered and addressed.

## 2018-02-13 ENCOUNTER — Ambulatory Visit: Payer: Medicaid Other

## 2018-02-13 DIAGNOSIS — M5442 Lumbago with sciatica, left side: Secondary | ICD-10-CM | POA: Diagnosis not present

## 2018-02-13 DIAGNOSIS — M6281 Muscle weakness (generalized): Secondary | ICD-10-CM

## 2018-02-13 DIAGNOSIS — R262 Difficulty in walking, not elsewhere classified: Secondary | ICD-10-CM

## 2018-02-13 DIAGNOSIS — G8929 Other chronic pain: Secondary | ICD-10-CM

## 2018-02-13 NOTE — Therapy (Signed)
[facility] [address]  [place], [place], [address] Phone: [phone]   Fax:  [phone]  Physical Therapy Treatment  Patient Details  Name: Courtney Davies MRN: [id number] Date of Birth: Oct 27, 1975 Referring Provider (PT): Marcial Pacas, MD   Encounter Date: 02/13/2018  PT End of Session - 02/13/18 0945    Visit Number  5    Number of Visits  10    Date for PT Re-Evaluation  02/27/18    Authorization Type  Medicaid    Authorization Time Period  10.10.19 - 11.20.19    Authorization - Visit Number  1    Authorization - Number of Visits  6    PT Start Time  [id number]    PT Stop Time  1030   ended visit with 10 min moist heat    PT Time Calculation (min)  52 min    Activity Tolerance  Patient tolerated treatment well;Patient limited by pain    Behavior During Therapy  [facility] for tasks assessed/performed       Past Medical History:  Diagnosis Date  . Anemia 09/2015  . Arthritis    "knees" (04/23/2017)  . Asthma    "teens; went away; came back" (04/23/2017)  . B12 deficiency anemia 04/23/2017  . Chronic bronchitis ([place])   . Chronic lower back pain   . Fatty liver   . GERD (gastroesophageal reflux disease)   . Headache    "1-2/wk" (04/23/2017)  . History of blood transfusion "plenty"   "related to anemia" (04/23/2017)  . Inguinal hernia   . Low back pain   . Migraine    "1-2/month" (04/23/2017)  . Symptomatic anemia 04/23/2017    Past Surgical History:  Procedure Laterality Date  . ABDOMINAL HYSTERECTOMY  YRS AGO   COMPLETE  . [place]; 2002  . INGUINAL HERNIA REPAIR Bilateral 1980s    "total of 4 surgeries" (04/23/2017)  . KNEE ARTHROSCOPY WITH MEDIAL MENISECTOMY Left 01/30/2018   Procedure: LEFT KNEE ARTHROSCOPY WITH PARTIAL LATERAL MENISCECTOMY;  Surgeon: Mcarthur Rossetti, MD;  Location: [facility];  Service: Orthopedics;  Laterality: Left;  . TUBAL LIGATION  2002    There were no vitals filed  for this visit.  Subjective Assessment - 02/13/18 0939    Subjective  Pt. reporting that MD gave her, "a few knee exercises" which she has been doing.      Pertinent History  DM, B12 deficiency anemia, migraine, LBP, GERD, chronic bronchitis, asthma     Diagnostic tests  lumbar MRI scheduled for 12/28/17    Patient Stated Goals  this pain going away; sit, stand, walk without pain    Currently in Pain?  Yes    Pain Score  6     Pain Location  Back    Pain Orientation  Right;Mid    Pain Descriptors / Indicators  Sharp;Stabbing    Pain Type  Surgical pain    Pain Radiating Towards  none today    Multiple Pain Sites  No    Pain Score  8    Pain Location  Knee    Pain Orientation  Left    Pain Descriptors / Indicators  --   "stiffness"   Pain Type  Acute pain         [facility] PT Assessment - 02/13/18 0948      AROM   Right/Left Knee  Left;Right    Right Knee Extension  -3  hyperextension    Right Knee Flexion  136    Left Knee Extension  8    Left Knee Flexion  90    Lumbar Flexion  lower shins     Lumbar Extension  WFL   pain in lower back   Lumbar - Right Side Bend  jt line    Lumbar - Left Side Bend  jt line    Lumbar - Right Rotation  moderately limited    Lumbar - Left Rotation  moderately limited      Strength   Strength Assessment Site  Hip;Knee;Ankle    Right/Left Hip  Right;Left    Right Hip Flexion  3+/5    Right Hip ABduction  3+/5    Right Hip ADduction  3+/5    Left Hip Flexion  3+/5    Left Hip ABduction  3+/5    Left Hip ADduction  3+/5    Right/Left Knee  Right;Left    Right Knee Flexion  4-/5    Right Knee Extension  4+/5    Left Knee Flexion  3+/5    Left Knee Extension  3+/5    Right/Left Ankle  Right;Left    Right Ankle Dorsiflexion  4-/5    Left Ankle Dorsiflexion  4-/5                   [facility] Adult PT Treatment/Exercise - 02/13/18 1052      Lumbar Exercises: Stretches   Passive Hamstring Stretch  Left;30 seconds;2 reps       Knee/Hip Exercises: Supine   Quad Sets  Left;1 set;Strengthening;10 reps    Quad Sets Limitations  5" hold     Straight Leg Raises  Left;1 set;10 reps    Straight Leg Raises Limitations  cueing required for quad set prior to each rep    Other Supine Knee/Hip Exercises  L knee flexion stetch with heels on peanut p-ball and strap assistance 5" x 10 reps       Moist Heat Therapy   Number Minutes Moist Heat  10 Minutes    Moist Heat Location  --   L proximal quad      Manual Therapy   Manual Therapy  Soft tissue mobilization;Myofascial release    Manual therapy comments  supine with L knee on bolster    Soft tissue mobilization  STM to L quad proximal and distal in area of tenderness; palpable TP's and increased tension     Myofascial Release  TPR to L proximal quads              PT Education - 02/13/18 1103    Education Details  HEP update     Person(s) Educated  Patient    Methods  Explanation;Demonstration;Verbal cues;Handout    Comprehension  Verbalized understanding;Returned demonstration;Verbal cues required;Need further instruction       PT Short Term Goals - 01/16/18 8657      PT SHORT TERM GOAL #1   Title  Patient to be independent with initial HEP.    Time  1    Period  Weeks    Status  Achieved        PT Long Term Goals - 01/16/18 8469      PT LONG TERM GOAL #1   Title  Patient to be independent with advanced HEP.    Time  6    Period  Weeks    Status  On-going   met for current   Target Date  02/27/18      PT LONG TERM GOAL #2   Title  Patient to demonstrate [facility] and pain <4/10 with lumbar AROM.    Time  6    Period  Weeks    Status  On-going   improvements demonstrated in lumbar flexion and R & L sidebending   Target Date  02/27/18      PT LONG TERM GOAL #3   Title  Patient to demonstrate B LE strength >=4/5.    Time  6    Period  Weeks    Status  On-going   improvements demonstrated in B hip abduction, hip adduction, L hip flexion, L knee  extension strength    Target Date  02/27/18      PT LONG TERM GOAL #4   Title  Patient to report tolerance of 20 min of walking without increase in pain level.    Time  6    Period  Weeks    Status  On-going   5 min at this time   Target Date  02/27/18      PT LONG TERM GOAL #5   Title  Patient to report tolerance of 15 min of sitting without increase in pain level.    Time  6    Period  Weeks    Status  On-going   10 min at this time   Target Date  02/27/18            Plan - 02/13/18 1057    Clinical Impression Statement  Pt. returning to therapy after nearly four weeks break due to knee surgery.  Pt. primary complaint today was L knee pain following partial lateral meniscectomy.  Session focused on HEP update to address L LE weakness and ROM deficits as to improve ambulating pattern and reduce strain on knee and lumbar spine.  HEP updated and issued to pt. via handout with pt. able to tolerated all activities well.  Pt. did required cueing throughout therex today in session to prevent holding breath and for proper pacing and technique.  Will likely required HEP review to check for proper technique in future visits.  Ende visit with moist heat to L proximal quad for hopeful reduction in overall tone and improvement in comfort.  Will monitor response in coming visits.      Clinical Impairments Affecting Rehab Potential  DM, B12 deficiency anemia, migraine, LBP, GERD, chronic bronchitis, asthma    PT Treatment/Interventions  ADLs/Self Care Home Management;Cryotherapy;Electrical Stimulation;Moist Heat;Traction;Ultrasound;Gait training;Stair training;Functional mobility training;Therapeutic activities;Therapeutic exercise;Manual techniques;Patient/family education;Neuromuscular re-education;Balance training;Passive range of motion;Dry needling;Energy conservation;Splinting;Taping    Consulted and Agree with Plan of Care  Patient       Patient will benefit from skilled therapeutic  intervention in order to improve the following deficits and impairments:  Decreased endurance, Decreased activity tolerance, Decreased strength, Pain, Decreased balance, Decreased mobility, Difficulty walking, Abnormal gait, Decreased range of motion, Improper body mechanics, Postural dysfunction  Visit Diagnosis: Chronic left-sided low back pain with left-sided sciatica  Muscle weakness (generalized)  Difficulty in walking, not elsewhere classified     Problem List Patient Active Problem List   Diagnosis Date Noted  . Other tear of medial meniscus, current injury, left knee, subsequent encounter 01/30/2018  . 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/19/2017  . Anemia 04/23/2017  . Acute bronchitis 04/23/2017  . Chronic back pain 10/03/2015  . Symptomatic anemia 04/10/2014  . Bilateral edema of lower extremity   . Low back pain with left-sided  sciatica     Bess Harvest, PTA 02/13/18 11:05 AM   [facility] [address]  [place], [place], [address] Phone: [phone]   Fax:  [phone]  Name: Courtney Davies MRN: [id number] Date of Birth: [birth year]

## 2018-02-20 ENCOUNTER — Encounter: Payer: Self-pay | Admitting: Physical Therapy

## 2018-02-20 ENCOUNTER — Ambulatory Visit: Payer: Medicaid Other | Attending: Neurology | Admitting: Physical Therapy

## 2018-02-20 DIAGNOSIS — M5442 Lumbago with sciatica, left side: Secondary | ICD-10-CM | POA: Diagnosis present

## 2018-02-20 DIAGNOSIS — M6281 Muscle weakness (generalized): Secondary | ICD-10-CM

## 2018-02-20 DIAGNOSIS — R262 Difficulty in walking, not elsewhere classified: Secondary | ICD-10-CM | POA: Diagnosis present

## 2018-02-20 DIAGNOSIS — G8929 Other chronic pain: Secondary | ICD-10-CM | POA: Insufficient documentation

## 2018-02-20 NOTE — Therapy (Signed)
[facility] [address]  [place], [place], [address] Phone: [phone]   Fax:  [phone]  Physical Therapy Treatment  Patient Details  Name: Courtney Davies MRN: [id number] Date of Birth: 1976/04/08 Referring Provider (PT): Marcial Pacas, MD   Encounter Date: 02/20/2018  PT End of Session - 02/20/18 1008    Visit Number  6    Number of Visits  10    Date for PT Re-Evaluation  02/27/18    Authorization Type  Medicaid    Authorization Time Period  10.10.19 - 11.20.19    Authorization - Visit Number  2    Authorization - Number of Visits  6    PT Start Time  [phone]    PT Stop Time  1016   ice pack   PT Time Calculation (min)  52 min    Activity Tolerance  Patient tolerated treatment well    Behavior During Therapy  [facility] for tasks assessed/performed       Past Medical History:  Diagnosis Date  . Anemia 09/2015  . Arthritis    "knees" (04/23/2017)  . Asthma    "teens; went away; came back" (04/23/2017)  . B12 deficiency anemia 04/23/2017  . Chronic bronchitis ([place])   . Chronic lower back pain   . Fatty liver   . GERD (gastroesophageal reflux disease)   . Headache    "1-2/wk" (04/23/2017)  . History of blood transfusion "plenty"   "related to anemia" (04/23/2017)  . Inguinal hernia   . Low back pain   . Migraine    "1-2/month" (04/23/2017)  . Symptomatic anemia 04/23/2017    Past Surgical History:  Procedure Laterality Date  . ABDOMINAL HYSTERECTOMY  YRS AGO   COMPLETE  . [place]; 2002  . INGUINAL HERNIA REPAIR Bilateral 1980s    "total of 4 surgeries" (04/23/2017)  . KNEE ARTHROSCOPY WITH MEDIAL MENISECTOMY Left 01/30/2018   Procedure: LEFT KNEE ARTHROSCOPY WITH PARTIAL LATERAL MENISCECTOMY;  Surgeon: Mcarthur Rossetti, MD;  Location: [facility];  Service: Orthopedics;  Laterality: Left;  . TUBAL LIGATION  2002    There were no vitals filed for this visit.  Subjective Assessment - 02/20/18  0926    Subjective  Reports she is doing okay, trying to focus on getting her "knee to do right." Has been doing HEP and that is going well. Thinks MD will have to take fluid off the knee.     Pertinent History  DM, B12 deficiency anemia, migraine, LBP, GERD, chronic bronchitis, asthma     Diagnostic tests  lumbar MRI scheduled for 12/28/17    Patient Stated Goals  this pain going away; sit, stand, walk without pain    Currently in Pain?  Yes    Pain Score  5     Pain Location  Knee    Pain Orientation  Left    Pain Descriptors / Indicators  Aching;Dull;Burning    Pain Type  Acute pain;Surgical pain                       [facility] Adult PT Treatment/Exercise - 02/20/18 0001      Exercises   Exercises  Lumbar;Knee/Hip      Lumbar Exercises: Stretches   Passive Hamstring Stretch  Right;Left;1 rep;30 seconds;Limitations    Passive Hamstring Stretch Limitations  supine strap    Gastroc Stretch  Right;Left;1 rep;30 seconds;Limitations    Gastroc Stretch Limitations  prostretch at counter      Lumbar Exercises: Aerobic   Nustep  L1 x 6 min UE/LEs      Lumbar Exercises: Seated   Other Seated Lumbar Exercises  pallof press green TB 10x each side      Lumbar Exercises: Supine   Clam  15 reps;Limitations    Clam Limitations  red TB around knees; cues for core contraction      Knee/Hip Exercises: Standing   Functional Squat  1 set;10 reps;Limitations   cues to avoid deep squat anf to bring hips back   Functional Squat Limitations  at counter top; avoiding past 80 deg      Knee/Hip Exercises: Seated   Long Arc Quad  Left;1 set;10 reps;Limitations    Long Arc Quad Limitations  slow concentric and eccentric phase to tolerance    Other Seated Knee/Hip Exercises  beach ball in lap ab isometric 10x10"    Other Seated Knee/Hip Exercises  sitting adduction squeeze with ball 10x10"      Knee/Hip Exercises: Supine   Quad Sets  Left;1 set;Strengthening;5 reps    Quad Sets  Limitations  10" hold    Heel Slides  AAROM;Left;1 set;5 reps;Limitations    Heel Slides Limitations  L LE on orange pball 5x3"   in order to review HEP and improve gait mechanics    Straight Leg Raises  Left;1 set;10 reps    Straight Leg Raises Limitations  heavy cues required for speed and quad contraction      Modalities   Modalities  Cryotherapy      Cryotherapy   Number Minutes Cryotherapy  10 Minutes    Cryotherapy Location  Knee   L   Type of Cryotherapy  Ice pack             PT Education - 02/20/18 1010    Education Details  review and re-administered last session's HEP    Person(s) Educated  Patient    Methods  Explanation;Demonstration;Tactile cues;Verbal cues;Handout    Comprehension  Verbalized understanding;Returned demonstration       PT Short Term Goals - 01/16/18 0937      PT SHORT TERM GOAL #1   Title  Patient to be independent with initial HEP.    Time  1    Period  Weeks    Status  Achieved        PT Long Term Goals - 01/16/18 9735      PT LONG TERM GOAL #1   Title  Patient to be independent with advanced HEP.    Time  6    Period  Weeks    Status  On-going   met for current   Target Date  02/27/18      PT LONG TERM GOAL #2   Title  Patient to demonstrate [facility] and pain <4/10 with lumbar AROM.    Time  6    Period  Weeks    Status  On-going   improvements demonstrated in lumbar flexion and R & L sidebending   Target Date  02/27/18      PT LONG TERM GOAL #3   Title  Patient to demonstrate B LE strength >=4/5.    Time  6    Period  Weeks    Status  On-going   improvements demonstrated in B hip abduction, hip adduction, L hip flexion, L knee extension strength    Target Date  02/27/18      PT LONG TERM GOAL #  4   Title  Patient to report tolerance of 20 min of walking without increase in pain level.    Time  6    Period  Weeks    Status  On-going   5 min at this time   Target Date  02/27/18      PT LONG TERM GOAL #5   Title   Patient to report tolerance of 15 min of sitting without increase in pain level.    Time  6    Period  Weeks    Status  On-going   10 min at this time   Target Date  02/27/18            Plan - 02/20/18 1008    Clinical Impression Statement  Patient arrived to session with report of persisting edema in L knee. Reports LB is getting better but still hurting with prolonged standing. L knee slightly firm from edema at beginning of session. Advised patient to ice and elevate L LE rather than use heat at this point in healing process. Patient reported understanding. Reviewed HEP administered last session and corrected form. Lack of L knee ROM and poor quad activation contributing to patient's impaired gait pattern and likely aggravating LB symptoms. Patient with better overall tolerance of both core stabilization and LE strengthening exercises today compared to previous sessions. Good tolerance of squats, avoiding past 80 degrees. Ended session with ice pack to L knee to decrease pain and edema. No complaints at end of session.     Clinical Impairments Affecting Rehab Potential  DM, B12 deficiency anemia, migraine, LBP, GERD, chronic bronchitis, asthma    PT Treatment/Interventions  ADLs/Self Care Home Management;Cryotherapy;Electrical Stimulation;Moist Heat;Traction;Ultrasound;Gait training;Stair training;Functional mobility training;Therapeutic activities;Therapeutic exercise;Manual techniques;Patient/family education;Neuromuscular re-education;Balance training;Passive range of motion;Dry needling;Energy conservation;Splinting;Taping    Consulted and Agree with Plan of Care  Patient       Patient will benefit from skilled therapeutic intervention in order to improve the following deficits and impairments:  Decreased endurance, Decreased activity tolerance, Decreased strength, Pain, Decreased balance, Decreased mobility, Difficulty walking, Abnormal gait, Decreased range of motion, Improper body  mechanics, Postural dysfunction  Visit Diagnosis: Chronic left-sided low back pain with left-sided sciatica  Muscle weakness (generalized)  Difficulty in walking, not elsewhere classified     Problem List Patient Active Problem List   Diagnosis Date Noted  . Other tear of medial meniscus, current injury, left knee, subsequent encounter 01/30/2018  . 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/19/2017  . Anemia 04/23/2017  . Acute bronchitis 04/23/2017  . Chronic back pain 10/03/2015  . Symptomatic anemia 04/10/2014  . Bilateral edema of lower extremity   . Low back pain with left-sided sciatica     Janene Harvey, PT, DPT 02/20/18 10:19 AM   [facility] [address]  [place], [place], [address] Phone: [phone]   Fax:  [phone]  Name: Courtney Davies MRN: [id number] Date of Birth: [birth year]

## 2018-02-27 ENCOUNTER — Encounter: Payer: Self-pay | Admitting: Physical Therapy

## 2018-02-27 ENCOUNTER — Ambulatory Visit: Payer: Medicaid Other | Admitting: Physical Therapy

## 2018-02-27 DIAGNOSIS — M6281 Muscle weakness (generalized): Secondary | ICD-10-CM

## 2018-02-27 DIAGNOSIS — R262 Difficulty in walking, not elsewhere classified: Secondary | ICD-10-CM

## 2018-02-27 DIAGNOSIS — M5442 Lumbago with sciatica, left side: Principal | ICD-10-CM

## 2018-02-27 DIAGNOSIS — G8929 Other chronic pain: Secondary | ICD-10-CM

## 2018-02-27 NOTE — Therapy (Addendum)
[facility] [address]  [place], [place], [address] Phone: [phone]   Fax:  [phone]  Physical Therapy Treatment  Patient Details  Name: Courtney Davies MRN: [id number] Date of Birth: 23-Jun-1975 Referring Provider (PT): Marcial Pacas, MD   Encounter Date: 02/27/2018  PT End of Session - 02/27/18 1158    Visit Number  7    Number of Visits  11    Date for PT Re-Evaluation  03/27/18    Authorization Type  Medicaid    Authorization Time Period  10.10.19 - 11.20.19    Authorization - Visit Number  3    Authorization - Number of Visits  6    PT Start Time  0935    PT Stop Time  1013    PT Time Calculation (min)  38 min    Activity Tolerance  Patient tolerated treatment well;Patient limited by pain    Behavior During Therapy  [facility] for tasks assessed/performed       Past Medical History:  Diagnosis Date  . Anemia 09/2015  . Arthritis    "knees" (04/23/2017)  . Asthma    "teens; went away; came back" (04/23/2017)  . B12 deficiency anemia 04/23/2017  . Chronic bronchitis ([place])   . Chronic lower back pain   . Fatty liver   . GERD (gastroesophageal reflux disease)   . Headache    "1-2/wk" (04/23/2017)  . History of blood transfusion "plenty"   "related to anemia" (04/23/2017)  . Inguinal hernia   . Low back pain   . Migraine    "1-2/month" (04/23/2017)  . Symptomatic anemia 04/23/2017    Past Surgical History:  Procedure Laterality Date  . ABDOMINAL HYSTERECTOMY  YRS AGO   COMPLETE  . [place]; 2002  . INGUINAL HERNIA REPAIR Bilateral 1980s    "total of 4 surgeries" (04/23/2017)  . KNEE ARTHROSCOPY WITH MEDIAL MENISECTOMY Left 01/30/2018   Procedure: LEFT KNEE ARTHROSCOPY WITH PARTIAL LATERAL MENISCECTOMY;  Surgeon: Mcarthur Rossetti, MD;  [facility];  Service: Orthopedics;  Laterality: Left;  . TUBAL LIGATION  2002    There were no vitals filed for this visit.  Subjective  Assessment - 02/27/18 0938    Subjective  Reports her back is about the same and not doing much better. Has been doing HEP 2-3x a day and has been very consistent. Knee is doing better.     Pertinent History  DM, B12 deficiency anemia, migraine, LBP, GERD, chronic bronchitis, asthma     Diagnostic tests  lumbar MRI scheduled for 12/28/17    Patient Stated Goals  this pain going away; sit, stand, walk without pain    Currently in Pain?  Yes    Pain Score  4     Pain Location  Knee    Pain Orientation  Left    Pain Descriptors / Indicators  Aching;Dull;Burning    Pain Type  Acute pain;Surgical pain    Pain Score  4    Pain Location  Back    Pain Orientation  Left    Pain Descriptors / Indicators  --   stiffness   Pain Type  Acute pain         [facility] PT Assessment - 02/27/18 0001      Assessment   Medical Diagnosis  Chronic LBP with sciatica    Referring Provider (PT)  Marcial Pacas, MD    Onset Date/Surgical Date  12/25/16  AROM   Lumbar Flexion  ankle   3/10 pain in LB   Lumbar Extension  WFL   mid LBP 5/10   Lumbar - Right Side Bend  proximal shin   4-5/10 midback pain   Lumbar - Left Side Bend  proximal shin   4-5/10 midback pain   Lumbar - Right Rotation  moderately limited    Lumbar - Left Rotation  [facility]      Strength   Strength Assessment Site  Hip;Knee;Ankle    Right/Left Hip  Right;Left    Right Hip Flexion  4+/5    Right Hip ABduction  4+/5    Right Hip ADduction  4+/5    Left Hip Flexion  4-/5    Left Hip ABduction  4-/5    Left Hip ADduction  4-/5    Right Knee Flexion  4+/5    Right Knee Extension  4+/5    Left Knee Flexion  4-/5    Left Knee Extension  4/5    Right Ankle Dorsiflexion  4+/5    Right Ankle Plantar Flexion  4+/5    Left Ankle Dorsiflexion  4/5    Left Ankle Plantar Flexion  4+/5                   [facility] Adult PT Treatment/Exercise - 02/27/18 0001      Exercises   Exercises  Lumbar;Knee/Hip      Lumbar Exercises:  Stretches   Passive Hamstring Stretch  Right;Left;1 rep;30 seconds;Limitations    Passive Hamstring Stretch Limitations  supine strap    Hip Flexor Stretch  Right;Left;1 rep;30 seconds;Limitations    Hip Flexor Stretch Limitations  mod thomas strap      Lumbar Exercises: Aerobic   Nustep  L2 x 6 min UE/LEs      Lumbar Exercises: Standing   Functional Squats  10 reps    Functional Squats Limitations  cues for L weight shifts; at counter top    Other Standing Lumbar Exercises  paloff press with green TB x10 each direction   heavy cues for form     Lumbar Exercises: Seated   Other Seated Lumbar Exercises  sitting ab set with beach ball in lap 10x10"      Lumbar Exercises: Supine   Bridge  10 reps;Limitations   cues to decrease speed   Bridge Limitations  B LEs on orange pball    Bridge with [employer]  10 reps;Limitations    Bridge with [employer] Limitations  good form             PT Education - 02/27/18 1158    Education Details  update to HEP; administered green TB    Person(s) Educated  Patient    Methods  Explanation;Demonstration;Tactile cues;Verbal cues;Handout    Comprehension  Verbalized understanding;Returned demonstration       PT Short Term Goals - 02/27/18 0944      PT SHORT TERM GOAL #1   Title  Patient to be independent with initial HEP.    Time  1    Period  Weeks    Status  Achieved        PT Long Term Goals - 02/27/18 0944      PT LONG TERM GOAL #1   Title  Patient to be independent with advanced HEP.    Time  4    Period  Weeks    Status  On-going   met for current   Target Date  03/27/18      PT LONG TERM GOAL #2   Title  Patient to demonstrate [facility] and pain <4/10 with lumbar AROM.    Time  4    Period  Weeks    Status  Partially Met   good improvements in lumbar flexion pain level and ROM, B sidebending ROM, and L rotation ROM   Target Date  03/27/18      PT LONG TERM GOAL #3   Title  Patient to demonstrate B LE strength  >=4/5.    Time  4    Period  Weeks    Status  Partially Met   improvements demonstrated in B hip flexion, B hip abduction and adduction, B knee flexion, L knee extension, and B ankle DF/PF    Target Date  03/27/18      PT LONG TERM GOAL #4   Title  Patient to report tolerance of 20 min of walking without increase in pain level.    Time  4    Period  Weeks    Status  On-going   5-15 min    Target Date  03/27/18      PT LONG TERM GOAL #5   Title  Patient to report tolerance of 15 min of sitting without increase in pain level.    Time  4    Period  Weeks    Status  Partially Met   10-15 min   Target Date  03/27/18            Plan - 02/27/18 1246    Clinical Impression Statement  Patient arrived to session with report that LBP has not improvement since last session, however L knee is doing better since her surgery. Updated goals- good improvements in lumbar flexion pain level and ROM, B sidebending ROM, and L rotation ROM demonstrated. Patient also exhibiting tremendous strength improvements in B hip flexion, B hip abduction and adduction, B knee flexion, L knee extension, and B ankle DF/PF. Standing and sitting tolerance is improving, but still shows room for growth. Spoke with patient about how far she has come with PT as patient had not realized the degree to which she has improved. Is better ambulate in the clinic with visibly less pain and able to more actively participate with therapy sessions.  Worked on core and LE strengthening today and updated paloff press with increased banded resistance. Updated HEP to also include squats as patient tolerating these well. Patient voicing that she would like to continue with PT, as she was unable to attend a few sessions secondary to her knee surgery. Patient would continue to benefit from skilled PT services 1x/week for 4 weeks to address remaining goals.    Clinical Impairments Affecting Rehab Potential  DM, B12 deficiency anemia, migraine,  LBP, GERD, chronic bronchitis, asthma    PT Frequency  1x / week    PT Duration  4 weeks    PT Treatment/Interventions  ADLs/Self Care Home Management;Cryotherapy;Electrical Stimulation;Moist Heat;Traction;Ultrasound;Gait training;Stair training;Functional mobility training;Therapeutic activities;Therapeutic exercise;Manual techniques;Patient/family education;Neuromuscular re-education;Balance training;Passive range of motion;Dry needling;Energy conservation;Splinting;Taping    Consulted and Agree with Plan of Care  Patient       Patient will benefit from skilled therapeutic intervention in order to improve the following deficits and impairments:  Decreased endurance, Decreased activity tolerance, Decreased strength, Pain, Decreased balance, Decreased mobility, Difficulty walking, Abnormal gait, Decreased range of motion, Improper body mechanics, Postural dysfunction  Visit Diagnosis: Chronic left-sided low back pain with left-sided sciatica  Muscle weakness (generalized)  Difficulty in walking, not elsewhere classified     Problem List Patient Active Problem List   Diagnosis Date Noted  . Other tear of medial meniscus, current injury, left knee, subsequent encounter 01/30/2018  . 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/19/2017  . Anemia 04/23/2017  . Acute bronchitis 04/23/2017  . Chronic back pain 10/03/2015  . Symptomatic anemia 04/10/2014  . Bilateral edema of lower extremity   . Low back pain with left-sided sciatica     Janene Harvey, PT, DPT 02/27/18 12:48 PM   [facility] [address]  [place], [place], [address] Phone: [phone]   Fax:  [phone]  Name: Courtney Davies MRN: [id number] Date of Birth: Jul 25, 1975  PHYSICAL THERAPY DISCHARGE SUMMARY  Visits from Start of Care: 7  Current functional level related to goals / functional outcomes: Unable to assess; patient requested to be D/C'd  d/t change in work schedule   Remaining deficits: Unable to assess   Education / Equipment: HEP  Plan: Patient agrees to discharge.  Patient goals were partially met. Patient is being discharged due to the patient's request.  ?????     Janene Harvey, PT, DPT 03/25/18 3:34 PM

## 2018-03-06 ENCOUNTER — Ambulatory Visit: Payer: Medicaid Other | Admitting: Physical Therapy

## 2018-03-06 ENCOUNTER — Ambulatory Visit (INDEPENDENT_AMBULATORY_CARE_PROVIDER_SITE_OTHER): Payer: Medicaid Other | Admitting: Orthopaedic Surgery

## 2018-03-12 ENCOUNTER — Ambulatory Visit: Payer: Medicaid Other | Admitting: Physical Therapy

## 2018-03-17 ENCOUNTER — Ambulatory Visit (INDEPENDENT_AMBULATORY_CARE_PROVIDER_SITE_OTHER): Payer: Medicaid Other | Admitting: Physician Assistant

## 2018-03-23 ENCOUNTER — Other Ambulatory Visit: Payer: Self-pay | Admitting: Pulmonary Disease

## 2018-03-27 ENCOUNTER — Encounter: Payer: Medicaid Other | Admitting: Physical Therapy

## 2018-05-14 ENCOUNTER — Ambulatory Visit (INDEPENDENT_AMBULATORY_CARE_PROVIDER_SITE_OTHER): Payer: Medicaid Other | Admitting: Orthopaedic Surgery

## 2018-05-14 DIAGNOSIS — M23352 Other meniscus derangements, posterior horn of lateral meniscus, left knee: Secondary | ICD-10-CM

## 2018-05-14 DIAGNOSIS — Z9889 Other specified postprocedural states: Secondary | ICD-10-CM | POA: Diagnosis not present

## 2018-05-14 MED ORDER — METHYLPREDNISOLONE ACETATE 40 MG/ML IJ SUSP
40.0000 mg | INTRAMUSCULAR | Status: AC | PRN
  Administered 2018-05-14: 40 mg via INTRA_ARTICULAR

## 2018-05-14 MED ORDER — LIDOCAINE HCL 1 % IJ SOLN
3.0000 mL | INTRAMUSCULAR | Status: AC | PRN
  Administered 2018-05-14: 3 mL

## 2018-05-14 NOTE — Progress Notes (Signed)
Office Visit Note   Patient: Courtney Davies           Date of Birth: 06-May-1975           MRN: [id number] Visit Date: 05/14/2018              Requested by: Elisabeth Cara, [place] [address] [place],  [address] PCP: Belva Bertin, [place], [place]   Assessment & Plan: Visit Diagnoses:  1. Status post arthroscopy of left knee     Plan: I am concerned about her left knee.  I do feel that she will likely end up with a knee replacement given the disease in her knee.  I showed her arthroscopy pictures again but given her malalignment of the swelling this is certainly worrisome.  I was able to aspirate about 40 to 50 cc of fluid from her knee today and place a steroid injection in it.  She did tolerate the steroid injection well.  We are going to see if we can get assistance or approval for hyaluronic acid for her.  I would like to see her back in 4 weeks to see how she is done with the steroid injection.  Follow-Up Instructions: No follow-ups on file.   Orders:  No orders of the defined types were placed in this encounter.  No orders of the defined types were placed in this encounter.     Procedures: Large Joint Inj: L knee on 05/14/2018 3:37 PM Indications: diagnostic evaluation and pain Details: 22 G 1.5 in needle, superolateral approach  Arthrogram: No  Medications: 3 mL lidocaine 1 %; 40 mg methylPREDNISolone acetate 40 MG/ML Outcome: tolerated well, no immediate complications Procedure, treatment alternatives, risks and benefits explained, specific risks discussed. Consent was given by the patient. Immediately prior to procedure a time out was called to verify the correct patient, procedure, equipment, support staff and site/side marked as required. Patient was prepped and draped in the usual sterile fashion.       Clinical Data: No additional findings.   Subjective: Chief Complaint  Patient presents with  . Left Knee - Follow-up  The patient  is somewhat have seen before.  She actually had a left knee arthroscopy done in October of this past year.  She had significant arthritis on the lateral compartment of her knee and the patellofemoral joint but mainly lateral compartment with a lateral meniscal tear.  She is also someone who weighs 237 pounds but her knees are thin.  Her left knee does have valgus malalignment.  It is still been quite painful to her and swelling as well.  Surgery is really not helped her at all.  HPI  Review of Systems She currently denies any headache, chest pain, shortness of breath, fever, chills, nausea, vomiting.  Objective: Vital Signs: LMP 12/03/2012   Physical Exam She is alert and orient x3 and in no acute distress Ortho Exam Examination of her left knee does show moderate effusion.  There is valgus malalignment with lateral joint line tenderness.  She cannot fully extend that knee either.  Her right knee shows no effusion and actually has better alignment and hyperextends. Specialty Comments:  No specialty comments available.  Imaging: No results found.   PMFS History: Patient Active Problem List   Diagnosis Date Noted  . Other tear of medial meniscus, current injury, left knee, subsequent encounter 01/30/2018  . 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/19/2017  . Anemia 04/23/2017  . Acute bronchitis 04/23/2017  .  Chronic back pain 10/03/2015  . Symptomatic anemia 04/10/2014  . Bilateral edema of lower extremity   . Low back pain with left-sided sciatica    Past Medical History:  Diagnosis Date  . Anemia 09/2015  . Arthritis    "knees" (04/23/2017)  . Asthma    "teens; went away; came back" (04/23/2017)  . B12 deficiency anemia 04/23/2017  . Chronic bronchitis ([place])   . Chronic lower back pain   . Fatty liver   . GERD (gastroesophageal reflux disease)   . Headache    "1-2/wk" (04/23/2017)  . History of blood transfusion "plenty"   "related to anemia" (04/23/2017)  . Inguinal hernia     . Low back pain   . Migraine    "1-2/month" (04/23/2017)  . Symptomatic anemia 04/23/2017    Family History  Problem Relation Age of Onset  . Anemia Paternal Grandmother   . Valvular heart disease Paternal Grandmother   . Diabetes Mother   . Hypertension Mother   . Diabetes Maternal Grandmother   . Prostate cancer Maternal Uncle        ? intestinal also  . Diabetes Father     Past Surgical History:  Procedure Laterality Date  . ABDOMINAL HYSTERECTOMY  YRS AGO   COMPLETE  . [place]; 2002  . INGUINAL HERNIA REPAIR Bilateral 1980s    "total of 4 surgeries" (04/23/2017)  . KNEE ARTHROSCOPY WITH MEDIAL MENISECTOMY Left 01/30/2018   Procedure: LEFT KNEE ARTHROSCOPY WITH PARTIAL LATERAL MENISCECTOMY;  Surgeon: Mcarthur Rossetti, MD;  Location: [facility];  Service: Orthopedics;  Laterality: Left;  . TUBAL LIGATION  2002   Social History   Occupational History  . Occupation: Advertising account planner in [employer]  . Smoking status: Current Every Day Smoker    Packs/day: 0.50    Years: 25.00    Pack years: 12.50    Types: Cigarettes  . Smokeless tobacco: Never Used  Substance and Sexual Activity  . Alcohol use: Yes    Alcohol/week: 0.0 standard drinks    Comment: rarely  . Drug use: Yes    Types: Marijuana    Comment: daily use  . Sexual activity: Not on file

## 2018-08-07 ENCOUNTER — Emergency Department (HOSPITAL_BASED_OUTPATIENT_CLINIC_OR_DEPARTMENT_OTHER): Payer: No Typology Code available for payment source

## 2018-08-07 ENCOUNTER — Encounter (HOSPITAL_BASED_OUTPATIENT_CLINIC_OR_DEPARTMENT_OTHER): Payer: Self-pay

## 2018-08-07 ENCOUNTER — Emergency Department (HOSPITAL_BASED_OUTPATIENT_CLINIC_OR_DEPARTMENT_OTHER)
Admission: EM | Admit: 2018-08-07 | Discharge: 2018-08-07 | Disposition: A | Discharge status: Home / Self Care | Payer: No Typology Code available for payment source | Attending: Emergency Medicine | Admitting: Emergency Medicine

## 2018-08-07 ENCOUNTER — Other Ambulatory Visit: Payer: Self-pay

## 2018-08-07 DIAGNOSIS — Y939 Activity, unspecified: Secondary | ICD-10-CM | POA: Insufficient documentation

## 2018-08-07 DIAGNOSIS — S161XXA Strain of muscle, fascia and tendon at neck level, initial encounter: Secondary | ICD-10-CM | POA: Diagnosis not present

## 2018-08-07 DIAGNOSIS — J45909 Unspecified asthma, uncomplicated: Secondary | ICD-10-CM | POA: Diagnosis not present

## 2018-08-07 DIAGNOSIS — Z79899 Other long term (current) drug therapy: Secondary | ICD-10-CM | POA: Insufficient documentation

## 2018-08-07 DIAGNOSIS — Y929 Unspecified place or not applicable: Secondary | ICD-10-CM | POA: Insufficient documentation

## 2018-08-07 DIAGNOSIS — M25571 Pain in right ankle and joints of right foot: Secondary | ICD-10-CM | POA: Diagnosis not present

## 2018-08-07 DIAGNOSIS — F17210 Nicotine dependence, cigarettes, uncomplicated: Secondary | ICD-10-CM | POA: Insufficient documentation

## 2018-08-07 DIAGNOSIS — Z91040 Latex allergy status: Secondary | ICD-10-CM | POA: Diagnosis not present

## 2018-08-07 DIAGNOSIS — Y999 Unspecified external cause status: Secondary | ICD-10-CM | POA: Diagnosis not present

## 2018-08-07 DIAGNOSIS — M25562 Pain in left knee: Secondary | ICD-10-CM | POA: Diagnosis not present

## 2018-08-07 DIAGNOSIS — S199XXA Unspecified injury of neck, initial encounter: Secondary | ICD-10-CM | POA: Diagnosis present

## 2018-08-07 MED ORDER — IBUPROFEN 400 MG PO TABS
600.0000 mg | ORAL_TABLET | Freq: Once | ORAL | Status: AC
  Administered 2018-08-07: 600 mg via ORAL
  Filled 2018-08-07: qty 1

## 2018-08-07 MED ORDER — METHOCARBAMOL 500 MG PO TABS
1000.0000 mg | ORAL_TABLET | Freq: Once | ORAL | Status: AC
  Administered 2018-08-07: 1000 mg via ORAL
  Filled 2018-08-07: qty 2

## 2018-08-07 MED ORDER — IBUPROFEN 600 MG PO TABS: 600.0000 mg | ORAL_TABLET | Freq: Three times a day (TID) | ORAL | 0 refills | Status: DC | PRN

## 2018-08-07 MED ORDER — METHOCARBAMOL 500 MG PO TABS: 1000.0000 mg | ORAL_TABLET | Freq: Three times a day (TID) | ORAL | 0 refills | Status: DC | PRN

## 2018-08-07 NOTE — ED Notes (Signed)
Assisted to bathroom via wheelchair
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
## 2018-09-03 ENCOUNTER — Other Ambulatory Visit: Payer: Self-pay

## 2018-09-03 ENCOUNTER — Other Ambulatory Visit (INDEPENDENT_AMBULATORY_CARE_PROVIDER_SITE_OTHER): Payer: Self-pay

## 2018-09-03 ENCOUNTER — Ambulatory Visit (INDEPENDENT_AMBULATORY_CARE_PROVIDER_SITE_OTHER): Payer: Medicaid Other | Admitting: Orthopaedic Surgery

## 2018-09-03 ENCOUNTER — Encounter: Payer: Self-pay | Admitting: Orthopaedic Surgery

## 2018-09-03 DIAGNOSIS — M23352 Other meniscus derangements, posterior horn of lateral meniscus, left knee: Secondary | ICD-10-CM | POA: Diagnosis not present

## 2018-09-03 DIAGNOSIS — M1712 Unilateral primary osteoarthritis, left knee: Secondary | ICD-10-CM

## 2018-09-03 DIAGNOSIS — Z9889 Other specified postprocedural states: Secondary | ICD-10-CM | POA: Diagnosis not present

## 2018-09-03 DIAGNOSIS — M25572 Pain in left ankle and joints of left foot: Secondary | ICD-10-CM

## 2018-09-03 MED ORDER — NABUMETONE 500 MG PO TABS: 500.0000 mg | ORAL_TABLET | Freq: Two times a day (BID) | ORAL | 1 refills | Status: DC | PRN

## 2018-09-03 MED ORDER — HYDROCODONE-ACETAMINOPHEN 5-325 MG PO TABS: 1.0000 | ORAL_TABLET | Freq: Four times a day (QID) | ORAL | 0 refills | Status: DC | PRN
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
## 2018-09-13 ENCOUNTER — Telehealth: Payer: Self-pay

## 2018-09-13 DIAGNOSIS — Z20822 Contact with and (suspected) exposure to covid-19: Secondary | ICD-10-CM

## 2018-09-13 NOTE — Telephone Encounter (Signed)
Phone call to pt. To discuss potential exposure to COVID 19, at recent appt. With [facility].  Offered an appt. at [facility] for COVID screening; 12:00 PM on 5/31. Pt. Verb. Understanding and agreed with plan.

## 2018-09-14 ENCOUNTER — Other Ambulatory Visit: Payer: Medicaid Other

## 2018-09-14 DIAGNOSIS — Z20822 Contact with and (suspected) exposure to covid-19: Secondary | ICD-10-CM

## 2018-09-15 ENCOUNTER — Telehealth: Payer: Self-pay

## 2018-09-15 NOTE — Telephone Encounter (Signed)
Patient was on the 09/14/18 COVID testing schedule.  When she missed appointment a call was made and she stated she would come before 5:00 p.m.  She did not come and again a call was made at approximately 5:30 p.m.  She did not answer.

## 2018-09-25 ENCOUNTER — Other Ambulatory Visit (INDEPENDENT_AMBULATORY_CARE_PROVIDER_SITE_OTHER): Payer: Self-pay | Admitting: Orthopaedic Surgery

## 2018-09-25 ENCOUNTER — Telehealth: Payer: Self-pay | Admitting: Orthopaedic Surgery

## 2018-09-25 DIAGNOSIS — M25562 Pain in left knee: Secondary | ICD-10-CM

## 2018-09-25 NOTE — Telephone Encounter (Signed)
Patient called stated she was sent paperwork from [facility] and stated she was lacking information and paper was filled out incorrectly.  Please call patient to advise what she needs to do to complete form.

## 2018-09-26 ENCOUNTER — Telehealth: Payer: Self-pay

## 2018-09-26 NOTE — Telephone Encounter (Signed)
Talked with patient concerning [employer] paperwork.

## 2018-09-26 NOTE — Telephone Encounter (Signed)
Talked with patient and advised her that I never received [employer] paperwork and that I will check with Dr. Trevor Mace assistant on Monday, 09/29/2018 to see if she has it.

## 2018-09-30 ENCOUNTER — Telehealth: Payer: Self-pay | Admitting: Orthopaedic Surgery

## 2018-09-30 NOTE — Telephone Encounter (Signed)
Courtney Davies with [employer] called asked for a call back concerning information missing for [employer] form. The number to contact Courtney Davies is [phone]

## 2018-10-02 ENCOUNTER — Telehealth: Payer: Self-pay

## 2018-10-02 NOTE — Telephone Encounter (Signed)
Talked with patient and advised her that I faxed missing information to [employer] to complete her application for gel injection, left knee.  Advised patient that we can not schedule an appointment for gel injection until we receive the medication.  Patient stated that she understands.

## 2018-10-02 NOTE — Telephone Encounter (Signed)
Talked with representative at [place] concerning missing information on HCP page.  Completed strength and Sig information for gel injection and faxed to [employer] at [phone].

## 2018-10-06 ENCOUNTER — Ambulatory Visit: Payer: Medicaid Other | Admitting: Orthopaedic Surgery

## 2018-10-27 ENCOUNTER — Telehealth: Payer: Self-pay | Admitting: Orthopaedic Surgery

## 2018-10-27 ENCOUNTER — Other Ambulatory Visit: Payer: Self-pay

## 2018-10-27 MED ORDER — DIAZEPAM 5 MG PO TABS: ORAL_TABLET | ORAL | 0 refills | Status: DC

## 2018-10-27 NOTE — Telephone Encounter (Signed)
This is pending p2p, message was sent advising of p2p

## 2018-10-27 NOTE — Telephone Encounter (Signed)
Called patient to let her know that I called in Rx for her and she said [facility] hasn't received approval yet?

## 2018-10-27 NOTE — Telephone Encounter (Signed)
Patient called stating that she is getting MRI tomorrow and need something to relax during that process.  Please call patient back appt @ 5:40  [phone]

## 2018-10-28 ENCOUNTER — Other Ambulatory Visit: Payer: Medicaid Other

## 2018-11-03 ENCOUNTER — Ambulatory Visit: Payer: Medicaid Other | Admitting: Orthopaedic Surgery

## 2018-11-21 ENCOUNTER — Ambulatory Visit
Admission: RE | Admit: 2018-11-21 | Discharge: 2018-11-21 | Disposition: A | Discharge status: Home / Self Care | Payer: Medicaid Other | Source: Ambulatory Visit | Attending: Orthopaedic Surgery | Admitting: Orthopaedic Surgery

## 2018-11-21 DIAGNOSIS — M25562 Pain in left knee: Secondary | ICD-10-CM

## 2018-11-25 ENCOUNTER — Ambulatory Visit: Payer: Medicaid Other | Admitting: Orthopaedic Surgery

## 2018-12-01 ENCOUNTER — Encounter: Payer: Self-pay | Admitting: Orthopaedic Surgery

## 2018-12-01 ENCOUNTER — Ambulatory Visit (INDEPENDENT_AMBULATORY_CARE_PROVIDER_SITE_OTHER): Payer: Medicaid Other | Admitting: Orthopaedic Surgery

## 2018-12-01 DIAGNOSIS — M1712 Unilateral primary osteoarthritis, left knee: Secondary | ICD-10-CM | POA: Diagnosis not present

## 2018-12-01 DIAGNOSIS — Z9889 Other specified postprocedural states: Secondary | ICD-10-CM

## 2018-12-01 MED ORDER — HYDROCODONE-ACETAMINOPHEN 5-325 MG PO TABS: 1.0000 | ORAL_TABLET | Freq: Four times a day (QID) | ORAL | 0 refills | Status: DC | PRN

## 2018-12-01 NOTE — Progress Notes (Signed)
The patient is a 43 year old female with severe debilitating pain involving her left knee.  She has valgus malalignment that knee and we have performed arthroscopic intervention finding a macerated meniscal tear of the lateral compartment.  Her pain is gotten worse and have aspirated fluid from the knee on 2 occasions.  We sent her for repeat MRI and she is here for review this today.  She still has knee swelling and pain and hurts quite a bit.  She is walking with a limp and she is very frustrated given amount of pain she is having in the knee.  MRIs reviewed with her today show moderate joint effusion.  There is no new meniscal tear and there is worsening arthritis in the lateral compartment of her knee.  We need to try at least hyaluronic acid but were having trouble getting this for her since she is a patient with Medicaid and they will not cover these injections.  We will see if we can get a company to even donate injection due to her frustration and our frustration.  The only other option would be a knee replacement for her which we may need to consider.  I will try a short course of hydrocodone but I cautioned her about using this for long-term.  Hopefully will get an injection that we can put in her soon.  All question concerns were answered and addressed and I empathize with her situation.

## 2018-12-08 ENCOUNTER — Telehealth: Payer: Self-pay

## 2018-12-08 NOTE — Telephone Encounter (Signed)
Talked with representative at [employer] to check the status of patient's application and was advised that a new application is needed.  [employer] has a newer application that has to be completed.  They are no longer accepting the old [employer] application.  Mailed new [employer] application to patient's address listed in the chart.

## 2019-03-23 ENCOUNTER — Other Ambulatory Visit: Payer: Self-pay | Admitting: Physician Assistant

## 2019-08-07 ENCOUNTER — Other Ambulatory Visit: Payer: Self-pay

## 2019-08-07 ENCOUNTER — Emergency Department (HOSPITAL_BASED_OUTPATIENT_CLINIC_OR_DEPARTMENT_OTHER)
Admission: EM | Admit: 2019-08-07 | Discharge: 2019-08-07 | Disposition: A | Discharge status: Home / Self Care | Payer: Medicaid Other | Attending: Emergency Medicine | Admitting: Emergency Medicine

## 2019-08-07 ENCOUNTER — Emergency Department (HOSPITAL_BASED_OUTPATIENT_CLINIC_OR_DEPARTMENT_OTHER): Payer: Medicaid Other

## 2019-08-07 ENCOUNTER — Encounter (HOSPITAL_BASED_OUTPATIENT_CLINIC_OR_DEPARTMENT_OTHER): Payer: Self-pay

## 2019-08-07 DIAGNOSIS — F17210 Nicotine dependence, cigarettes, uncomplicated: Secondary | ICD-10-CM | POA: Insufficient documentation

## 2019-08-07 DIAGNOSIS — Z79899 Other long term (current) drug therapy: Secondary | ICD-10-CM | POA: Diagnosis not present

## 2019-08-07 DIAGNOSIS — R1011 Right upper quadrant pain: Secondary | ICD-10-CM | POA: Insufficient documentation

## 2019-08-07 DIAGNOSIS — Z91040 Latex allergy status: Secondary | ICD-10-CM | POA: Diagnosis not present

## 2019-08-07 DIAGNOSIS — R000 Tachycardia, unspecified: Secondary | ICD-10-CM | POA: Insufficient documentation

## 2019-08-07 DIAGNOSIS — Z888 Allergy status to other drugs, medicaments and biological substances status: Secondary | ICD-10-CM | POA: Insufficient documentation

## 2019-08-07 DIAGNOSIS — R109 Unspecified abdominal pain: Secondary | ICD-10-CM

## 2019-08-07 DIAGNOSIS — Z885 Allergy status to narcotic agent status: Secondary | ICD-10-CM | POA: Insufficient documentation

## 2019-08-07 DIAGNOSIS — E119 Type 2 diabetes mellitus without complications: Secondary | ICD-10-CM | POA: Insufficient documentation

## 2019-08-07 DIAGNOSIS — J45909 Unspecified asthma, uncomplicated: Secondary | ICD-10-CM | POA: Insufficient documentation

## 2019-08-07 HISTORY — DX: Type 2 diabetes mellitus without complications: E11.9

## 2019-08-07 LAB — URINALYSIS, ROUTINE W REFLEX MICROSCOPIC
Bilirubin Urine: NEGATIVE
Glucose, UA: NEGATIVE mg/dL
Ketones, ur: NEGATIVE mg/dL
Leukocytes,Ua: NEGATIVE
Nitrite: NEGATIVE
Protein, ur: NEGATIVE mg/dL
Specific Gravity, Urine: 1.02 (ref 1.005–1.030)
pH: 7.5 (ref 5.0–8.0)

## 2019-08-07 LAB — COMPREHENSIVE METABOLIC PANEL
ALT: 16 U/L (ref 0–44)
AST: 15 U/L (ref 15–41)
Albumin: 3.8 g/dL (ref 3.5–5.0)
Alkaline Phosphatase: 109 U/L (ref 38–126)
Anion gap: 9 (ref 5–15)
BUN: 12 mg/dL (ref 6–20)
CO2: 18 mmol/L — ABNORMAL LOW (ref 22–32)
Calcium: 8.5 mg/dL — ABNORMAL LOW (ref 8.9–10.3)
Chloride: 106 mmol/L (ref 98–111)
Creatinine, Ser: 0.57 mg/dL (ref 0.44–1.00)
GFR calc Af Amer: >60 mL/min (ref >60)
GFR calc non Af Amer: >60 mL/min (ref >60)
Glucose, Bld: 121 mg/dL — ABNORMAL HIGH (ref 70–99)
Potassium: 3.6 mmol/L (ref 3.5–5.1)
Sodium: 133 mmol/L — ABNORMAL LOW (ref 135–145)
Total Bilirubin: 0.4 mg/dL (ref 0.3–1.2)
Total Protein: 7.3 g/dL (ref 6.5–8.1)

## 2019-08-07 LAB — URINALYSIS, MICROSCOPIC (REFLEX)

## 2019-08-07 LAB — CBC
HCT: 44.2 % (ref 36.0–46.0)
Hemoglobin: 14.2 g/dL (ref 12.0–15.0)
MCH: 22.4 pg — ABNORMAL LOW (ref 26.0–34.0)
MCHC: 32.1 g/dL (ref 30.0–36.0)
MCV: 69.7 fL — ABNORMAL LOW (ref 80.0–100.0)
Platelets: 257 10*3/uL (ref 150–400)
RBC: 6.34 MIL/uL — ABNORMAL HIGH (ref 3.87–5.11)
RDW: 17.2 % — ABNORMAL HIGH (ref 11.5–15.5)
WBC: 6.5 10*3/uL (ref 4.0–10.5)
nRBC: 0 % (ref 0.0–0.2)

## 2019-08-07 LAB — PREGNANCY, URINE: Preg Test, Ur: NEGATIVE

## 2019-08-07 LAB — LIPASE, BLOOD: Lipase: 27 U/L (ref 11–51)

## 2019-08-07 MED ORDER — ACETAMINOPHEN 325 MG PO TABS
650.0000 mg | ORAL_TABLET | Freq: Once | ORAL | Status: AC
  Administered 2019-08-07: 16:00:00 650 mg via ORAL
  Filled 2019-08-07: qty 2

## 2019-08-07 MED ORDER — ALUM & MAG HYDROXIDE-SIMETH 200-200-20 MG/5ML PO SUSP
30.0000 mL | Freq: Once | ORAL | Status: AC
  Administered 2019-08-07: 19:00:00 30 mL via ORAL
  Filled 2019-08-07: qty 30

## 2019-08-07 MED ORDER — ONDANSETRON HCL 4 MG/2ML IJ SOLN
4.0000 mg | Freq: Once | INTRAMUSCULAR | Status: AC
  Administered 2019-08-07: 16:00:00 4 mg via INTRAVENOUS
  Filled 2019-08-07: qty 2

## 2019-08-07 MED ORDER — IOHEXOL 300 MG/ML  SOLN
100.0000 mL | Freq: Once | INTRAMUSCULAR | Status: AC | PRN
  Administered 2019-08-07: 17:00:00 100 mL via INTRAVENOUS

## 2019-08-07 MED ORDER — MORPHINE SULFATE (PF) 4 MG/ML IV SOLN
4.0000 mg | Freq: Once | INTRAVENOUS | Status: AC
  Administered 2019-08-07: 16:00:00 4 mg via INTRAVENOUS
  Filled 2019-08-07: qty 1

## 2019-08-07 MED ORDER — MORPHINE SULFATE (PF) 4 MG/ML IV SOLN
4.0000 mg | Freq: Once | INTRAVENOUS | Status: AC
  Administered 2019-08-07: 20:00:00 4 mg via INTRAVENOUS
  Filled 2019-08-07: qty 1

## 2019-08-07 MED ORDER — SODIUM CHLORIDE 0.9 % IV BOLUS
1000.0000 mL | Freq: Once | INTRAVENOUS | Status: AC
  Administered 2019-08-07: 16:00:00 1000 mL via INTRAVENOUS

## 2019-08-07 MED ORDER — PANTOPRAZOLE SODIUM 40 MG PO TBEC: 40.0000 mg | DELAYED_RELEASE_TABLET | Freq: Every day | ORAL | 0 refills | Status: DC

## 2019-08-07 MED ORDER — LIDOCAINE VISCOUS HCL 2 % MT SOLN
15.0000 mL | Freq: Once | OROMUCOSAL | Status: AC
  Administered 2019-08-07: 19:00:00 15 mL via ORAL
  Filled 2019-08-07: qty 15

## 2019-08-07 NOTE — Discharge Instructions (Signed)
Follow up with your PCP and a GI doctor.

## 2019-08-07 NOTE — ED Triage Notes (Addendum)
Pt c/o abd pain to left side "cramping"-denies n/v/d-states she had a normal for her BM this am-NAD-steady gait

## 2019-08-07 NOTE — ED Provider Notes (Signed)
[facility] Provider Note   CSN: [id number] Arrival date & time: 08/07/19  1434     History Chief Complaint  Patient presents with  . Abdominal Pain    Courtney Davies is a 44 y.o. female.  Patient is a 44 y.o. female presenting with acute left sided abdominal pain x1 day.   Patient reports that starting at 7AM this morning she woke up with acute left sided cramping abdominal pain. Reports it is throughout her left side. Denies nausea, vomiting, or diarrhea. Had a normal BM this morning, no blood. Denies urinary complaints or hematuria. Denies fever or sick contacts. Denies any PMHx of abdominal pathology. Denies family hx of colon cancer, does report father has "intestine problem" but is not cancer. States she has not had anything to eat today. Did drink ginger ale. Reports yesterday she had a very greasy meal for dinner.   PMHx is significant for poorly controlled diabetes, but last A1C was 7.5 per patient. States that she was recently changed to bydureon from trulicity. Is worried that this is causing her issues.   Patient also endorses feeling of palpitations this afternoon as well. Reports it feels like her heart is racing. Denies chest pain or SOB. No known cardiac history.         Past Medical History:  Diagnosis Date  . Anemia 09/2015  . Arthritis    "knees" (04/23/2017)  . Asthma    "teens; went away; came back" (04/23/2017)  . B12 deficiency anemia 04/23/2017  . Chronic bronchitis ([place])   . Chronic lower back pain   . Diabetes mellitus without complication ([place])   . Fatty liver   . GERD (gastroesophageal reflux disease)   . Headache    "1-2/wk" (04/23/2017)  . History of blood transfusion "plenty"   "related to anemia" (04/23/2017)  . Inguinal hernia   . Low back pain   . Migraine    "1-2/month" (04/23/2017)  . Symptomatic anemia 04/23/2017    Patient Active Problem List   Diagnosis Date Noted  . Unilateral primary osteoarthritis,  left knee 12/01/2018  . Lateral meniscus, posterior horn derangement, left 05/14/2018  . Status post arthroscopy of left knee 05/14/2018  . 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/19/2017  . Anemia 04/23/2017  . Acute bronchitis 04/23/2017  . Chronic back pain 10/03/2015  . Symptomatic anemia 04/10/2014  . Bilateral edema of lower extremity   . Low back pain with left-sided sciatica     Past Surgical History:  Procedure Laterality Date  . ABDOMINAL HYSTERECTOMY  YRS AGO   COMPLETE  . [place]; 2002  . INGUINAL HERNIA REPAIR Bilateral 1980s    "total of 4 surgeries" (04/23/2017)  . KNEE ARTHROSCOPY WITH MEDIAL MENISECTOMY Left 01/30/2018   Procedure: LEFT KNEE ARTHROSCOPY WITH PARTIAL LATERAL MENISCECTOMY;  Surgeon: Mcarthur Rossetti, MD;  Location: [facility];  Service: Orthopedics;  Laterality: Left;  . TUBAL LIGATION  2002     OB History   No obstetric history on file.     Family History  Problem Relation Age of Onset  . Anemia Paternal Grandmother   . Valvular heart disease Paternal Grandmother   . Diabetes Mother   . Hypertension Mother   . Diabetes Maternal Grandmother   . Prostate cancer Maternal Uncle        ? intestinal also  . Diabetes Father     Social History   Tobacco Use  . Smoking status: Current Every Day  Smoker    Packs/day: 0.50    Years: 25.00    Pack years: 12.50    Types: Cigarettes  . Smokeless tobacco: Never Used  Substance Use Topics  . Alcohol use: Yes    Alcohol/week: 0.0 standard drinks    Comment: occ  . Drug use: Yes    Types: Marijuana    Home Medications Prior to Admission medications   Medication Sig Start Date End Date Taking? Authorizing Provider  albuterol (PROVENTIL HFA;VENTOLIN HFA) 108 (90 Base) MCG/ACT inhaler Inhale 2 puffs into the lungs every 6 (six) hours as needed for wheezing or shortness of breath. 10/04/15   Thurnell Lose, MD  BREO ELLIPTA 200-25 MCG/INH AEPB Inhale 1 puff into the lungs daily.   12/23/17   [provider]  Cyanocobalamin (VITAMIN B-12 IJ) Inject 100 mcg as directed 2 (two) times a week.     [provider]  cyclobenzaprine (FLEXERIL) 5 MG tablet Take 5 mg by mouth at bedtime.    [provider]  diazepam (VALIUM) 5 MG tablet Take one by mouth one hour prior to MRI, repeat as needed 10/27/18   Mcarthur Rossetti, MD  folic acid (FOLVITE) 1 MG tablet Take 1 mg by mouth daily.    [provider]  gabapentin (NEURONTIN) 300 MG capsule Take 1 capsule (300 mg total) by mouth 3 (three) times daily. 12/19/17   Marcial Pacas, MD  HYDROcodone-acetaminophen (NORCO/VICODIN) 5-325 MG tablet Take 1 tablet by mouth every 6 (six) hours as needed for moderate pain. 09/03/18   Mcarthur Rossetti, MD  HYDROcodone-acetaminophen (NORCO/VICODIN) 5-325 MG tablet Take 1-2 tablets by mouth every 6 (six) hours as needed for moderate pain. 12/01/18   Mcarthur Rossetti, MD  ibuprofen (ADVIL) 600 MG tablet Take 1 tablet (600 mg total) by mouth every 8 (eight) hours as needed. 08/07/18   Hayden Rasmussen, MD  LINZESS 290 MCG CAPS capsule TAKE 1 CAPSULE(290 MCG) BY MOUTH DAILY BEFORE BREAKFAST 03/23/19   Levin Erp, PA  methocarbamol (ROBAXIN) 500 MG tablet Take 2 tablets (1,000 mg total) by mouth every 8 (eight) hours as needed for muscle spasms. 08/07/18   Hayden Rasmussen, MD  nabumetone (RELAFEN) 500 MG tablet Take 1 tablet (500 mg total) by mouth 2 (two) times daily as needed. 09/03/18   Mcarthur Rossetti, MD  pantoprazole (PROTONIX) 40 MG tablet Take 1 tablet (40 mg total) by mouth daily. Patient taking differently: Take 40 mg by mouth 2 (two) times daily.  05/21/17   Levin Erp, PA  pantoprazole (PROTONIX) 40 MG tablet TAKE 1 TABLET(40 MG) BY MOUTH TWICE DAILY 03/24/18   Mannam, Praveen, MD  pantoprazole (PROTONIX) 40 MG tablet Take 1 tablet (40 mg total) by mouth daily for 14 days. 08/07/19 08/21/19  Caroline More, DO  predniSONE  (DELTASONE) 20 MG tablet Take 20 mg by mouth daily with breakfast.    [provider]  TOVIAZ 4 MG TB24 tablet Take 4 mg by mouth daily. 01/19/18   [provider]  varenicline (CHANTIX CONTINUING MONTH PAK) 1 MG tablet Take 1 tablet (1 mg total) by mouth 2 (two) times daily. 01/15/18   Mannam, Hart Robinsons, MD  varenicline (CHANTIX STARTING MONTH PAK) 0.5 MG X 11 & 1 MG X 42 tablet Take one 0.5 mg tablet by mouth once daily for 3 days, then increase to one 0.5 mg tablet twice daily for 4 days, then increase to one 1 mg tablet twice daily. 01/15/18  Mannam, Praveen, MD  vitamin C (ASCORBIC ACID) 500 MG tablet Take 500 mg by mouth daily.    [provider]  Vitamin D, Ergocalciferol, (DRISDOL) 50000 units CAPS capsule Take 50,000 Units by mouth every Thursday.  04/25/17   [provider]    Allergies    Other, Latex, Codeine, and Nitrofurantoin  Review of Systems   Review of Systems  Constitutional: Positive for appetite change. Negative for fever.  Respiratory: Negative for shortness of breath.   Cardiovascular: Positive for palpitations. Negative for chest pain.  Gastrointestinal: Positive for abdominal pain. Negative for blood in stool, constipation, diarrhea, nausea and vomiting.  Genitourinary: Negative for difficulty urinating, dysuria, frequency, hematuria and urgency.    Physical Exam Updated Vital Signs BP 123/71   Pulse 91   Temp 98.9 F (37.2 C) (Oral)   Resp 15   Ht 5' 5"  (1.651 m)   Wt 105.7 kg   LMP 12/03/2012   SpO2 99%   BMI 38.77 kg/m   Physical Exam Vitals reviewed.  Constitutional:      General: She is not in acute distress. HENT:     Head: Normocephalic.     Mouth/Throat:     Mouth: Mucous membranes are moist.  Eyes:     General: No scleral icterus.    Pupils: Pupils are equal, round, and reactive to light.  Cardiovascular:     Rate and Rhythm: Regular rhythm. Tachycardia present.     Heart sounds: No murmur. No friction  rub. No gallop.      Comments: Manual pulse between 110-120 Pulmonary:     Effort: Pulmonary effort is normal.     Breath sounds: No wheezing, rhonchi or rales.  Abdominal:     General: Bowel sounds are normal. There is no distension.     Palpations: Abdomen is soft. There is no hepatomegaly or splenomegaly.     Tenderness: There is abdominal tenderness in the right upper quadrant, epigastric area and left upper quadrant. Positive signs include Murphy's sign. Negative signs include Rovsing's sign and McBurney's sign.  Skin:    General: Skin is warm.     Findings: No rash.  Neurological:     General: No focal deficit present.     Mental Status: She is alert.  Psychiatric:        Mood and Affect: Mood normal.     ED Results / Procedures / Treatments   Labs (all labs ordered are listed, but only abnormal results are displayed) Labs Reviewed  COMPREHENSIVE METABOLIC PANEL - Abnormal; Notable for the following components:      Result Value   Sodium 133 (*)    CO2 18 (*)    Glucose, Bld 121 (*)    Calcium 8.5 (*)    All other components within normal limits  CBC - Abnormal; Notable for the following components:   RBC 6.34 (*)    MCV 69.7 (*)    MCH 22.4 (*)    RDW 17.2 (*)    All other components within normal limits  URINALYSIS, ROUTINE W REFLEX MICROSCOPIC - Abnormal; Notable for the following components:   Hgb urine dipstick SMALL (*)    All other components within normal limits  URINALYSIS, MICROSCOPIC (REFLEX) - Abnormal; Notable for the following components:   Bacteria, UA FEW (*)    All other components within normal limits  LIPASE, BLOOD  PREGNANCY, URINE    EKG EKG Interpretation  Date/Time:  Friday August 07 2019 15:27:49 EDT Ventricular Rate:  114 PR Interval:    QRS Duration: 66 QT Interval:  315 QTC Calculation: 434 R Axis:   80 Text Interpretation: Sinus tachycardia Biatrial enlargement Anteroseptal infarct, age indeterminate No significant change since  last tracing Confirmed by Blanchie Dessert [phone]) on 08/07/2019 3:43:56 PM   Radiology CT ABDOMEN PELVIS W CONTRAST  Result Date: 08/07/2019 CLINICAL DATA:  Left-sided abdominal pain, cramping sensation EXAM: CT ABDOMEN AND PELVIS WITH CONTRAST TECHNIQUE: Multidetector CT imaging of the abdomen and pelvis was performed using the standard protocol following bolus administration of intravenous contrast. CONTRAST:  113m OMNIPAQUE IOHEXOL 300 MG/ML  SOLN COMPARISON:  10/04/2015 FINDINGS: Lower chest: No acute pleural or parenchymal lung disease. Hepatobiliary: No focal liver abnormality is seen. No gallstones, gallbladder wall thickening, or biliary dilatation. Pancreas: Unremarkable. No pancreatic ductal dilatation or surrounding inflammatory changes. Spleen: Normal in size without focal abnormality. Adrenals/Urinary Tract: Simple appearing left renal peripelvic cysts are identified. Otherwise the left kidney is unremarkable. High attenuation is seen within the right kidney upper pole calices, compatible with nonobstructing calculi. Largest calcification measures 11 mm. No obstructive uropathy. The bladder is unremarkable. The adrenals are normal. Stomach/Bowel: No bowel obstruction or ileus. Normal appendix right lower quadrant. No bowel wall thickening or inflammatory change. Minimal sigmoid diverticulosis without diverticulitis. Vascular/Lymphatic: No significant vascular findings are present. No enlarged abdominal or pelvic lymph nodes. Reproductive: Status post hysterectomy. No adnexal masses. Other: No free fluid or free gas. Fat containing right lower quadrant ventral hernia, defect measures approximately 4.2 cm in diameter. Subcutaneous fat stranding within the lower anterior abdominal wall likely sequela of injections. Musculoskeletal: No acute or destructive bony lesions. Reconstructed images demonstrate no additional findings. IMPRESSION: 1. Nonobstructing right renal calculi. 2. Minimal sigmoid  diverticulosis without diverticulitis. 3. Fat containing right lower quadrant ventral hernia. Electronically Signed   By: MRanda NgoM.D.   On: 08/07/2019 17:26   [place] Limited RUQ  Result Date: 08/07/2019 CLINICAL DATA:  Generalized abdominal pain and nausea for 1 day EXAM: ULTRASOUND ABDOMEN LIMITED RIGHT UPPER QUADRANT COMPARISON:  08/07/2019 FINDINGS: Gallbladder: No gallstones or wall thickening visualized. No sonographic Murphy sign noted by sonographer. Common bile duct: Diameter: 4 mm Liver: No focal lesion identified. Within normal limits in parenchymal echogenicity. Portal vein is patent on color Doppler imaging with normal direction of blood flow towards the liver. Other: None. IMPRESSION: 1. Unremarkable right upper quadrant ultrasound. Electronically Signed   By: MRanda NgoM.D.   On: 08/07/2019 18:26    Procedures Procedures (including critical care time)  Medications Ordered in ED Medications  acetaminophen (TYLENOL) tablet 650 mg (650 mg Oral Given 08/07/19 1539)  sodium chloride 0.9 % bolus 1,000 mL (0 mLs Intravenous Stopped 08/07/19 1929)  ondansetron (ZOFRAN) injection 4 mg (4 mg Intravenous Given 08/07/19 1552)  morphine 4 MG/ML injection 4 mg (4 mg Intravenous Given 08/07/19 1552)  iohexol (OMNIPAQUE) 300 MG/ML solution 100 mL (100 mLs Intravenous Contrast Given 08/07/19 1657)  alum & mag hydroxide-simeth (MAALOX/MYLANTA) 200-200-20 MG/5ML suspension 30 mL (30 mLs Oral Given 08/07/19 1849)    And  lidocaine (XYLOCAINE) 2 % viscous mouth solution 15 mL (15 mLs Oral Given 08/07/19 1849)  morphine 4 MG/ML injection 4 mg (4 mg Intravenous Given 08/07/19 1934)    ED Course  I have reviewed the triage vital signs and the nursing notes.  Pertinent labs & imaging results that were available during my care of the patient were reviewed by me and considered in my medical decision making (see  chart for details).    MDM Rules/Calculators/A&P                      Patient  with acute abdominal pain and tachycardia. For abdominal pain,  Multiple differentials at this time. Patient with positive murphy's sign and h/o greasy meal overnight. Can consider cholecystitis as possible etiology. Can also consider pancreatitis given age, sex, and h/o diabetes. Will get lipase to r/o. Can consider liver pathology such as hepatitis, although less likely given no diarrhea or fever. Will get CMP to evaluate for liver pathology as well as evaluate for metabolic disorders. Given h/o poorly controlled diabetes cannot r/o gastroparesis as cause. Less likely from medication change given that she has been on medication for 1 month now. Can consider diverticulitis given subjective LLQ pain. Will start w/u with CBC, CMP, lipase, and UA. Will also get Urine preg to r/o pregnancy. Pending blood work will get imaging with [place] vs. CT.  Patient also with new onset tachycardia. Unclear etiology. EKG showing sinus tachycardia. Can consider dehydration given poor PO. Will give 1L fluid bolus. Blood work as above. Can consider reactionary response to pain.   16:33 CBC showing WBC wnl. Hgb stable. AST and ALT wnl. Alk phos wnl. Lipase 27. UA does show small Hgb. Will plan to image with CT with contrast.   17:41 Patient reports continued abdominal pain. CT showing renal stone but otherwise negative. Will plan to get RUQ [place] to r/o gallbladder pathology. Patient HR now 102. Tolerating PO, encouraged oral fluids.   18:39 [place] negative. Patient did report sour taste to Dr. Maryan Rued, can consider GERD. Will give GI cocktail and re-assess.   19:25 Patient with continued pain. Will give another dose of morphine and encourage PO. Will do fluid challenge. Will re-assess after morphine   20:45 Patient reports she continues to have abdominal pain.  Is able to tolerate p.o. and has been able to eat cheese and crackers.  Has been able to tolerate Sprite as well.  Given that patient is able to tolerate p.o. imaging  has been negative we will plan to discharge with close follow-up with PCP and possibly GI.  Differentials include GERD as patient has a history of Protonix use but has not been taking recently.  Can also consider gastric ulcer as a possible cause.  Will discharge with Protonix 40 mg and encourage follow-up with GI.  Can also consider start of gastro enteritis but no nausea, vomiting, diarrhea.  Can also consider gastroparesis given patient's poorly controlled diabetes in the past.  I discussed all these options with patient.  Patient is agreeable with discharge plan and follow-up with GI.  ED return precautions discussed. Tachycardia was resolved on re-eval. HR was in low 90s.   Patient discussed with Dr. Maryan Rued who also saw patient.  Final Clinical Impression(s) / ED Diagnoses Final diagnoses:  Abdominal pain, unspecified abdominal location  Tachycardia  RUQ abdominal pain    Rx / DC Orders ED Discharge Orders         Ordered    pantoprazole (PROTONIX) 40 MG tablet  Daily     08/07/19 2045           Caroline More, DO 08/07/19 2131    Blanchie Dessert, MD 08/10/19 1317

## 2019-08-23 ENCOUNTER — Encounter (HOSPITAL_BASED_OUTPATIENT_CLINIC_OR_DEPARTMENT_OTHER): Payer: Self-pay | Admitting: *Deleted

## 2019-08-23 ENCOUNTER — Other Ambulatory Visit: Payer: Self-pay

## 2019-08-23 ENCOUNTER — Emergency Department (HOSPITAL_BASED_OUTPATIENT_CLINIC_OR_DEPARTMENT_OTHER)
Admission: EM | Admit: 2019-08-23 | Discharge: 2019-08-23 | Disposition: A | Discharge status: Home / Self Care | Payer: Medicaid Other | Attending: Emergency Medicine | Admitting: Emergency Medicine

## 2019-08-23 DIAGNOSIS — J45909 Unspecified asthma, uncomplicated: Secondary | ICD-10-CM | POA: Insufficient documentation

## 2019-08-23 DIAGNOSIS — E119 Type 2 diabetes mellitus without complications: Secondary | ICD-10-CM | POA: Diagnosis not present

## 2019-08-23 DIAGNOSIS — Z79899 Other long term (current) drug therapy: Secondary | ICD-10-CM | POA: Insufficient documentation

## 2019-08-23 DIAGNOSIS — F17210 Nicotine dependence, cigarettes, uncomplicated: Secondary | ICD-10-CM | POA: Diagnosis not present

## 2019-08-23 DIAGNOSIS — L509 Urticaria, unspecified: Secondary | ICD-10-CM | POA: Diagnosis not present

## 2019-08-23 DIAGNOSIS — T7840XA Allergy, unspecified, initial encounter: Secondary | ICD-10-CM | POA: Diagnosis present

## 2019-08-23 MED ORDER — FAMOTIDINE 20 MG PO TABS
40.0000 mg | ORAL_TABLET | Freq: Once | ORAL | Status: AC
  Administered 2019-08-23: 04:00:00 40 mg via ORAL
  Filled 2019-08-23: qty 2

## 2019-08-23 MED ORDER — FAMOTIDINE 20 MG PO TABS: 20.0000 mg | ORAL_TABLET | Freq: Every day | ORAL | 0 refills | Status: DC

## 2019-08-23 MED ORDER — PREDNISONE 10 MG PO TABS: 20.0000 mg | ORAL_TABLET | Freq: Every day | ORAL | 0 refills | Status: AC

## 2019-08-23 MED ORDER — HYDROXYZINE HCL 25 MG PO TABS
25.0000 mg | ORAL_TABLET | Freq: Once | ORAL | Status: AC
  Administered 2019-08-23: 04:00:00 25 mg via ORAL
  Filled 2019-08-23: qty 1

## 2019-08-23 MED ORDER — PREDNISONE 20 MG PO TABS
40.0000 mg | ORAL_TABLET | Freq: Once | ORAL | Status: AC
  Administered 2019-08-23: 40 mg via ORAL
  Filled 2019-08-23: qty 2

## 2019-08-23 NOTE — ED Provider Notes (Signed)
[facility] EMERGENCY DEPARTMENT Provider Note  CSN: [id number] Arrival date & time: 08/23/19 4174  Chief Complaint(s) Allergic Reaction  HPI Courtney Davies is a 44 y.o. female with a past medical history listed below who presents to the emergency department with 4 days of hives that have gradually worsened since onset.  They have been constant.  Reports that they began in the back of her neck and have spread into her torso, bilateral upper and lower extremities.  Itching was initially relieved with Benadryl but it did not provide any relief tonight.  Patient reports returning from a trip to [place] where she stayed in a hotel.  She denies any new cosmetic product use.  She did starting Protonix 2 weeks ago and completed the course several days ago.  No recent fevers or infections.  No throat swelling.  HPI  Past Medical History Past Medical History:  Diagnosis Date  . Anemia 09/2015  . Arthritis    "knees" (04/23/2017)  . Asthma    "teens; went away; came back" (04/23/2017)  . B12 deficiency anemia 04/23/2017  . Chronic bronchitis ([place])   . Chronic lower back pain   . Diabetes mellitus without complication ([place])   . Fatty liver   . GERD (gastroesophageal reflux disease)   . Headache    "1-2/wk" (04/23/2017)  . History of blood transfusion "plenty"   "related to anemia" (04/23/2017)  . Inguinal hernia   . Low back pain   . Migraine    "1-2/month" (04/23/2017)  . Symptomatic anemia 04/23/2017   Patient Active Problem List   Diagnosis Date Noted  . Unilateral primary osteoarthritis, left knee 12/01/2018  . Lateral meniscus, posterior horn derangement, left 05/14/2018  . Status post arthroscopy of left knee 05/14/2018  . 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/19/2017  . Anemia 04/23/2017  . Acute bronchitis 04/23/2017  . Chronic back pain 10/03/2015  . Symptomatic anemia 04/10/2014  . Bilateral edema of lower extremity   . Low back pain with left-sided sciatica    Home  Medication(s) Prior to Admission medications   Medication Sig Start Date End Date Taking? Authorizing Provider  albuterol (PROVENTIL HFA;VENTOLIN HFA) 108 (90 Base) MCG/ACT inhaler Inhale 2 puffs into the lungs every 6 (six) hours as needed for wheezing or shortness of breath. 10/04/15   Thurnell Lose, MD  BREO ELLIPTA 200-25 MCG/INH AEPB Inhale 1 puff into the lungs daily.  12/23/17   [provider]  Cyanocobalamin (VITAMIN B-12 IJ) Inject 100 mcg as directed 2 (two) times a week.     [provider]  cyclobenzaprine (FLEXERIL) 5 MG tablet Take 5 mg by mouth at bedtime.    [provider]  diazepam (VALIUM) 5 MG tablet Take one by mouth one hour prior to MRI, repeat as needed 10/27/18   Mcarthur Rossetti, MD  folic acid (FOLVITE) 1 MG tablet Take 1 mg by mouth daily.    [provider]  gabapentin (NEURONTIN) 300 MG capsule Take 1 capsule (300 mg total) by mouth 3 (three) times daily. 12/19/17   Marcial Pacas, MD  HYDROcodone-acetaminophen (NORCO/VICODIN) 5-325 MG tablet Take 1 tablet by mouth every 6 (six) hours as needed for moderate pain. 09/03/18   Mcarthur Rossetti, MD  HYDROcodone-acetaminophen (NORCO/VICODIN) 5-325 MG tablet Take 1-2 tablets by mouth every 6 (six) hours as needed for moderate pain. 12/01/18   Mcarthur Rossetti, MD  ibuprofen (ADVIL) 600 MG tablet Take 1 tablet (600 mg total) by mouth  every 8 (eight) hours as needed. 08/07/18   Hayden Rasmussen, MD  LINZESS 290 MCG CAPS capsule TAKE 1 CAPSULE(290 MCG) BY MOUTH DAILY BEFORE BREAKFAST 03/23/19   Levin Erp, PA  methocarbamol (ROBAXIN) 500 MG tablet Take 2 tablets (1,000 mg total) by mouth every 8 (eight) hours as needed for muscle spasms. 08/07/18   Hayden Rasmussen, MD  nabumetone (RELAFEN) 500 MG tablet Take 1 tablet (500 mg total) by mouth 2 (two) times daily as needed. 09/03/18   Mcarthur Rossetti, MD  pantoprazole (PROTONIX) 40 MG tablet Take 1 tablet (40 mg  total) by mouth daily. Patient taking differently: Take 40 mg by mouth 2 (two) times daily.  05/21/17   Levin Erp, PA  pantoprazole (PROTONIX) 40 MG tablet TAKE 1 TABLET(40 MG) BY MOUTH TWICE DAILY 03/24/18   Mannam, Praveen, MD  pantoprazole (PROTONIX) 40 MG tablet Take 1 tablet (40 mg total) by mouth daily for 14 days. 08/07/19 08/21/19  Caroline More, DO  predniSONE (DELTASONE) 20 MG tablet Take 20 mg by mouth daily with breakfast.    [provider]  TOVIAZ 4 MG TB24 tablet Take 4 mg by mouth daily. 01/19/18   [provider]  varenicline (CHANTIX CONTINUING MONTH PAK) 1 MG tablet Take 1 tablet (1 mg total) by mouth 2 (two) times daily. 01/15/18   Mannam, Hart Robinsons, MD  varenicline (CHANTIX STARTING MONTH PAK) 0.5 MG X 11 & 1 MG X 42 tablet Take one 0.5 mg tablet by mouth once daily for 3 days, then increase to one 0.5 mg tablet twice daily for 4 days, then increase to one 1 mg tablet twice daily. 01/15/18   Mannam, Hart Robinsons, MD  vitamin C (ASCORBIC ACID) 500 MG tablet Take 500 mg by mouth daily.    [provider]  Vitamin D, Ergocalciferol, (DRISDOL) 50000 units CAPS capsule Take 50,000 Units by mouth every Thursday.  04/25/17   [provider]                                                                                                                                    Past Surgical History Past Surgical History:  Procedure Laterality Date  . ABDOMINAL HYSTERECTOMY  YRS AGO   COMPLETE  . [place]; 2002  . INGUINAL HERNIA REPAIR Bilateral 1980s    "total of 4 surgeries" (04/23/2017)  . KNEE ARTHROSCOPY WITH MEDIAL MENISECTOMY Left 01/30/2018   Procedure: LEFT KNEE ARTHROSCOPY WITH PARTIAL LATERAL MENISCECTOMY;  Surgeon: Mcarthur Rossetti, MD;  Location: [facility];  Service: Orthopedics;  Laterality: Left;  . TUBAL LIGATION  2002   Family History Family History  Problem Relation Age of Onset  . Anemia Paternal Grandmother   .  Valvular heart disease Paternal Grandmother   . Diabetes Mother   . Hypertension Mother   . Diabetes Maternal Grandmother   . Prostate cancer Maternal Uncle        ?  intestinal also  . Diabetes Father     Social History Social History   Tobacco Use  . Smoking status: Current Every Day Smoker    Packs/day: 0.50    Years: 25.00    Pack years: 12.50    Types: Cigarettes  . Smokeless tobacco: Never Used  Substance Use Topics  . Alcohol use: Yes    Alcohol/week: 0.0 standard drinks    Comment: occ  . Drug use: Yes    Types: Marijuana   Allergies Other, Latex, Codeine, and Nitrofurantoin  Review of Systems Review of Systems All other systems are reviewed and are negative for acute change except as noted in the HPI  Physical Exam Vital Signs  I have reviewed the triage vital signs BP (!) 152/99 (BP Location: Right Arm)   Pulse (!) 105   Temp 98 F (36.7 C) (Oral)   Resp 20   Ht 5' 6"  (1.676 m)   Wt 97.5 kg   LMP 12/03/2012   SpO2 100%   BMI 34.70 kg/m   Physical Exam Vitals reviewed.  Constitutional:      General: She is not in acute distress.    Appearance: She is well-developed. She is not diaphoretic.  HENT:     Head: Normocephalic and atraumatic.     Nose: Nose normal.     Mouth/Throat:     Mouth: No angioedema.  Eyes:     General: No scleral icterus.       Right eye: No discharge.        Left eye: No discharge.     Conjunctiva/sclera: Conjunctivae normal.     Pupils: Pupils are equal, round, and reactive to light.  Cardiovascular:     Rate and Rhythm: Normal rate and regular rhythm.     Heart sounds: No murmur. No friction rub. No gallop.   Pulmonary:     Effort: Pulmonary effort is normal. No respiratory distress.     Breath sounds: Normal breath sounds. No stridor. No rales.  Abdominal:     General: There is no distension.     Palpations: Abdomen is soft.     Tenderness: There is no abdominal tenderness.  Musculoskeletal:        General: No  tenderness.     Cervical back: Normal range of motion and neck supple.  Skin:    General: Skin is warm and dry.     Findings: Rash present. No erythema. Rash is urticarial (neck, torso, BUE and BLE).  Neurological:     Mental Status: She is alert and oriented to person, place, and time.     ED Results and Treatments Labs (all labs ordered are listed, but only abnormal results are displayed) Labs Reviewed - No data to display                                                                                                                       EKG  EKG Interpretation  Date/Time:    Ventricular  Rate:    PR Interval:    QRS Duration:   QT Interval:    QTC Calculation:   R Axis:     Text Interpretation:        Radiology No results found.  Pertinent labs & imaging results that were available during my care of the patient were reviewed by me and considered in my medical decision making (see chart for details).  Medications Ordered in ED Medications  famotidine (PEPCID) tablet 40 mg (has no administration in time range)  predniSONE (DELTASONE) tablet 40 mg (has no administration in time range)  hydrOXYzine (ATARAX/VISTARIL) tablet 25 mg (has no administration in time range)                                                                                                                                    Procedures Procedures  (including critical care time)  Medical Decision Making / ED Course I have reviewed the nursing notes for this encounter and the patient's prior records (if available in EHR or on provided paperwork).   Courtney Davies was evaluated in Emergency Department on 08/23/2019 for the symptoms described in the history of present illness. She was evaluated in the context of the global COVID-19 pandemic, which necessitated consideration that the patient might be at risk for infection with the SARS-CoV-2 virus that causes COVID-19. Institutional protocols and  algorithms that pertain to the evaluation of patients at risk for COVID-19 are in a state of rapid change based on information released by regulatory bodies including the CDC and federal and state organizations. These policies and algorithms were followed during the patient's care in the ED.  45 y.o. female here with pruritic rash. No known triggers or exposures. No respiratory, GI, or neurologic symptoms to suggest anaphylaxis. No recent infectious symptoms suggestive of viral urticaria.  Patient has  taken benadryl prior to arrival.   On exam, there is no evidence of oral swelling or airway compromise.   Given Vistaryl, H2 blocker, and steroids.   Safe for discharge with strict return precautions. Given Rx for H2 blocker and steroids.       Final Clinical Impression(s) / ED Diagnoses Final diagnoses:  None    The patient appears reasonably screened and/or stabilized for discharge and I doubt any other medical condition or other [facility] requiring further screening, evaluation, or treatment in the ED at this time prior to discharge. Safe for discharge with strict return precautions.  Disposition: Discharge  Condition: Good  I have discussed the results, Dx and Tx plan with the patient/family who expressed understanding and agree(s) with the plan. Discharge instructions discussed at length. The patient/family was given strict return precautions who verbalized understanding of the instructions. No further questions at time of discharge.    ED Discharge Orders         Ordered    famotidine (PEPCID) 20 MG tablet  Daily  08/23/19 0406    predniSONE (DELTASONE) 10 MG tablet  Daily     08/23/19 0406           Follow Up: Mariel Sleet [address] [place] [address] [phone]  Schedule an appointment as soon as possible for a visit  in 3-5 days, If symptoms do not improve or  worsen     This chart was dictated using voice recognition  software.  Despite best efforts to proofread,  errors can occur which can change the documentation meaning.   Fatima Blank, MD 08/23/19 [phone]

## 2019-08-23 NOTE — ED Triage Notes (Addendum)
C/o  hives that started on Thursday around her neck, but states more general today and worse. Took 2 benadryl at 1am this morning. C/o itching all over. Denies sob. Denies any new products or foods. Did start a new medication on last ED visit in April (protonix-just completed last dose). Pt scratching non stop during triage. General hives noted.

## 2020-01-07 ENCOUNTER — Other Ambulatory Visit: Payer: Self-pay | Admitting: Physician Assistant

## 2020-02-15 HISTORY — PX: ESOPHAGEAL DILATION: SHX303

## 2020-12-01 ENCOUNTER — Emergency Department (HOSPITAL_COMMUNITY): Payer: Medicaid Other

## 2020-12-01 ENCOUNTER — Inpatient Hospital Stay (HOSPITAL_COMMUNITY): Payer: Medicaid Other

## 2020-12-01 ENCOUNTER — Encounter (HOSPITAL_COMMUNITY): Payer: Self-pay

## 2020-12-01 ENCOUNTER — Inpatient Hospital Stay (HOSPITAL_COMMUNITY)
Admission: EM | Admit: 2020-12-01 | Discharge: 2021-01-02 | DRG: 308 | Disposition: A | Discharge status: Rehabilitation Facility | Payer: Medicaid Other | Attending: Internal Medicine | Admitting: Internal Medicine

## 2020-12-01 ENCOUNTER — Other Ambulatory Visit: Payer: Self-pay

## 2020-12-01 DIAGNOSIS — J189 Pneumonia, unspecified organism: Secondary | ICD-10-CM

## 2020-12-01 DIAGNOSIS — I469 Cardiac arrest, cause unspecified: Principal | ICD-10-CM

## 2020-12-01 DIAGNOSIS — I5021 Acute systolic (congestive) heart failure: Secondary | ICD-10-CM

## 2020-12-01 DIAGNOSIS — Z789 Other specified health status: Secondary | ICD-10-CM

## 2020-12-01 DIAGNOSIS — R509 Fever, unspecified: Secondary | ICD-10-CM

## 2020-12-01 DIAGNOSIS — E049 Nontoxic goiter, unspecified: Secondary | ICD-10-CM

## 2020-12-01 DIAGNOSIS — R1310 Dysphagia, unspecified: Secondary | ICD-10-CM

## 2020-12-01 DIAGNOSIS — D649 Anemia, unspecified: Secondary | ICD-10-CM

## 2020-12-01 DIAGNOSIS — Z978 Presence of other specified devices: Secondary | ICD-10-CM

## 2020-12-01 DIAGNOSIS — Z4659 Encounter for fitting and adjustment of other gastrointestinal appliance and device: Secondary | ICD-10-CM

## 2020-12-01 DIAGNOSIS — L03311 Cellulitis of abdominal wall: Secondary | ICD-10-CM

## 2020-12-01 DIAGNOSIS — Z7189 Other specified counseling: Secondary | ICD-10-CM

## 2020-12-01 DIAGNOSIS — I10 Essential (primary) hypertension: Secondary | ICD-10-CM

## 2020-12-01 DIAGNOSIS — E109 Type 1 diabetes mellitus without complications: Secondary | ICD-10-CM

## 2020-12-01 DIAGNOSIS — Z9289 Personal history of other medical treatment: Secondary | ICD-10-CM

## 2020-12-01 DIAGNOSIS — K219 Gastro-esophageal reflux disease without esophagitis: Secondary | ICD-10-CM

## 2020-12-01 DIAGNOSIS — G9340 Encephalopathy, unspecified: Secondary | ICD-10-CM

## 2020-12-01 DIAGNOSIS — Z01818 Encounter for other preprocedural examination: Secondary | ICD-10-CM

## 2020-12-01 DIAGNOSIS — E669 Obesity, unspecified: Secondary | ICD-10-CM | POA: Diagnosis present

## 2020-12-01 DIAGNOSIS — Z79899 Other long term (current) drug therapy: Secondary | ICD-10-CM

## 2020-12-01 DIAGNOSIS — N170 Acute kidney failure with tubular necrosis: Secondary | ICD-10-CM | POA: Diagnosis present

## 2020-12-01 DIAGNOSIS — J45909 Unspecified asthma, uncomplicated: Secondary | ICD-10-CM | POA: Diagnosis present

## 2020-12-01 DIAGNOSIS — Z90710 Acquired absence of both cervix and uterus: Secondary | ICD-10-CM

## 2020-12-01 DIAGNOSIS — Z6836 Body mass index (BMI) 36.0-36.9, adult: Secondary | ICD-10-CM | POA: Diagnosis not present

## 2020-12-01 DIAGNOSIS — S0001XA Abrasion of scalp, initial encounter: Secondary | ICD-10-CM | POA: Diagnosis present

## 2020-12-01 DIAGNOSIS — Z8249 Family history of ischemic heart disease and other diseases of the circulatory system: Secondary | ICD-10-CM

## 2020-12-01 DIAGNOSIS — G4733 Obstructive sleep apnea (adult) (pediatric): Secondary | ICD-10-CM | POA: Diagnosis present

## 2020-12-01 DIAGNOSIS — R269 Unspecified abnormalities of gait and mobility: Secondary | ICD-10-CM | POA: Diagnosis not present

## 2020-12-01 DIAGNOSIS — G931 Anoxic brain damage, not elsewhere classified: Secondary | ICD-10-CM | POA: Diagnosis present

## 2020-12-01 DIAGNOSIS — E1165 Type 2 diabetes mellitus with hyperglycemia: Secondary | ICD-10-CM | POA: Diagnosis present

## 2020-12-01 DIAGNOSIS — K9422 Gastrostomy infection: Secondary | ICD-10-CM | POA: Diagnosis not present

## 2020-12-01 DIAGNOSIS — U071 COVID-19: Secondary | ICD-10-CM | POA: Diagnosis not present

## 2020-12-01 DIAGNOSIS — R4189 Other symptoms and signs involving cognitive functions and awareness: Secondary | ICD-10-CM | POA: Diagnosis present

## 2020-12-01 DIAGNOSIS — Y9289 Other specified places as the place of occurrence of the external cause: Secondary | ICD-10-CM

## 2020-12-01 DIAGNOSIS — Z515 Encounter for palliative care: Secondary | ICD-10-CM | POA: Diagnosis not present

## 2020-12-01 DIAGNOSIS — Z5320 Procedure and treatment not carried out because of patient's decision for unspecified reasons: Secondary | ICD-10-CM | POA: Diagnosis not present

## 2020-12-01 DIAGNOSIS — J690 Pneumonitis due to inhalation of food and vomit: Secondary | ICD-10-CM | POA: Diagnosis not present

## 2020-12-01 DIAGNOSIS — K7200 Acute and subacute hepatic failure without coma: Secondary | ICD-10-CM | POA: Diagnosis present

## 2020-12-01 DIAGNOSIS — I252 Old myocardial infarction: Secondary | ICD-10-CM

## 2020-12-01 DIAGNOSIS — G2581 Restless legs syndrome: Secondary | ICD-10-CM | POA: Diagnosis present

## 2020-12-01 DIAGNOSIS — K2101 Gastro-esophageal reflux disease with esophagitis, with bleeding: Secondary | ICD-10-CM | POA: Diagnosis not present

## 2020-12-01 DIAGNOSIS — I110 Hypertensive heart disease with heart failure: Secondary | ICD-10-CM | POA: Diagnosis not present

## 2020-12-01 DIAGNOSIS — I952 Hypotension due to drugs: Secondary | ICD-10-CM | POA: Diagnosis not present

## 2020-12-01 DIAGNOSIS — I462 Cardiac arrest due to underlying cardiac condition: Secondary | ICD-10-CM | POA: Diagnosis present

## 2020-12-01 DIAGNOSIS — T4275XA Adverse effect of unspecified antiepileptic and sedative-hypnotic drugs, initial encounter: Secondary | ICD-10-CM | POA: Diagnosis not present

## 2020-12-01 DIAGNOSIS — W1789XA Other fall from one level to another, initial encounter: Secondary | ICD-10-CM | POA: Diagnosis present

## 2020-12-01 DIAGNOSIS — J9601 Acute respiratory failure with hypoxia: Secondary | ICD-10-CM | POA: Diagnosis present

## 2020-12-01 DIAGNOSIS — Z833 Family history of diabetes mellitus: Secondary | ICD-10-CM

## 2020-12-01 DIAGNOSIS — I472 Ventricular tachycardia: Secondary | ICD-10-CM | POA: Diagnosis not present

## 2020-12-01 DIAGNOSIS — E042 Nontoxic multinodular goiter: Secondary | ICD-10-CM | POA: Diagnosis present

## 2020-12-01 DIAGNOSIS — I429 Cardiomyopathy, unspecified: Secondary | ICD-10-CM | POA: Diagnosis present

## 2020-12-01 DIAGNOSIS — I4901 Ventricular fibrillation: Secondary | ICD-10-CM | POA: Diagnosis present

## 2020-12-01 DIAGNOSIS — Z791 Long term (current) use of non-steroidal anti-inflammatories (NSAID): Secondary | ICD-10-CM

## 2020-12-01 DIAGNOSIS — Y990 Civilian activity done for income or pay: Secondary | ICD-10-CM

## 2020-12-01 DIAGNOSIS — Z781 Physical restraint status: Secondary | ICD-10-CM

## 2020-12-01 DIAGNOSIS — E872 Acidosis: Secondary | ICD-10-CM | POA: Diagnosis present

## 2020-12-01 DIAGNOSIS — E876 Hypokalemia: Secondary | ICD-10-CM | POA: Diagnosis not present

## 2020-12-01 DIAGNOSIS — I480 Paroxysmal atrial fibrillation: Secondary | ICD-10-CM | POA: Diagnosis not present

## 2020-12-01 DIAGNOSIS — E1169 Type 2 diabetes mellitus with other specified complication: Secondary | ICD-10-CM | POA: Diagnosis not present

## 2020-12-01 DIAGNOSIS — Z794 Long term (current) use of insulin: Secondary | ICD-10-CM

## 2020-12-01 DIAGNOSIS — R451 Restlessness and agitation: Secondary | ICD-10-CM | POA: Diagnosis not present

## 2020-12-01 DIAGNOSIS — E119 Type 2 diabetes mellitus without complications: Secondary | ICD-10-CM | POA: Diagnosis not present

## 2020-12-01 DIAGNOSIS — R5381 Other malaise: Secondary | ICD-10-CM | POA: Diagnosis present

## 2020-12-01 DIAGNOSIS — I471 Supraventricular tachycardia: Secondary | ICD-10-CM | POA: Diagnosis not present

## 2020-12-01 HISTORY — DX: Gastro-esophageal reflux disease without esophagitis: K21.9

## 2020-12-01 HISTORY — DX: Anemia, unspecified: D64.9

## 2020-12-01 HISTORY — DX: Essential (primary) hypertension: I10

## 2020-12-01 HISTORY — DX: Type 2 diabetes mellitus without complications: E11.9

## 2020-12-01 HISTORY — DX: Osteoarthritis of knee, unspecified: M17.9

## 2020-12-01 HISTORY — DX: Other specified abnormal findings of blood chemistry: R79.89

## 2020-12-01 HISTORY — DX: Unilateral primary osteoarthritis, unspecified knee: M17.10

## 2020-12-01 HISTORY — DX: Deficiency of other specified B group vitamins: E53.8

## 2020-12-01 HISTORY — DX: Other chronic pain: G89.29

## 2020-12-01 HISTORY — DX: Gastro-esophageal reflux disease without esophagitis: K22.2

## 2020-12-01 LAB — ECHOCARDIOGRAM COMPLETE
AR max vel: 2.07 cm2
AV Area VTI: 2.34 cm2
AV Area mean vel: 1.91 cm2
AV Mean grad: 3 mmHg
AV Peak grad: 6.1 mmHg
Ao pk vel: 1.23 m/s
Area-P 1/2: 2.09 cm2
Calc EF: 40.8 %
Height: 66 in
MV M vel: 4.71 m/s
MV Peak grad: 88.7 mmHg
MV VTI: 1.68 cm2
Radius: 0.3 cm
S' Lateral: 4.8 cm
Single Plane A2C EF: 50.5 %
Single Plane A4C EF: 27.2 %
Weight: 3527.36 oz

## 2020-12-01 LAB — CBC WITH DIFFERENTIAL/PLATELET
Abs Immature Granulocytes: 0 10*3/uL (ref 0.00–0.07)
Basophils Absolute: 0 10*3/uL (ref 0.0–0.1)
Basophils Relative: 0 %
Eosinophils Absolute: 0.2 10*3/uL (ref 0.0–0.5)
Eosinophils Relative: 1 %
HCT: 42.7 % (ref 36.0–46.0)
Hemoglobin: 12.7 g/dL (ref 12.0–15.0)
Lymphocytes Relative: 39 %
Lymphs Abs: 6.2 10*3/uL — ABNORMAL HIGH (ref 0.7–4.0)
MCH: 22.5 pg — ABNORMAL LOW (ref 26.0–34.0)
MCHC: 29.7 g/dL — ABNORMAL LOW (ref 30.0–36.0)
MCV: 75.6 fL — ABNORMAL LOW (ref 80.0–100.0)
Monocytes Absolute: 1.3 10*3/uL — ABNORMAL HIGH (ref 0.1–1.0)
Monocytes Relative: 8 %
Neutro Abs: 8.2 10*3/uL — ABNORMAL HIGH (ref 1.7–7.7)
Neutrophils Relative %: 52 %
Platelets: 237 10*3/uL (ref 150–400)
RBC: 5.65 MIL/uL — ABNORMAL HIGH (ref 3.87–5.11)
RDW: 15.9 % — ABNORMAL HIGH (ref 11.5–15.5)
WBC: 15.8 10*3/uL — ABNORMAL HIGH (ref 4.0–10.5)
nRBC: 0 % (ref 0.0–0.2)

## 2020-12-01 LAB — CBG MONITORING, ED: Glucose-Capillary: 249 mg/dL — ABNORMAL HIGH (ref 70–99)

## 2020-12-01 LAB — I-STAT VENOUS BLOOD GAS, ED
Acid-base deficit: 14 mmol/L — ABNORMAL HIGH (ref 0.0–2.0)
Bicarbonate: 12.5 mmol/L — ABNORMAL LOW (ref 20.0–28.0)
Calcium, Ion: 1.04 mmol/L — ABNORMAL LOW (ref 1.15–1.40)
HCT: 42 % (ref 36.0–46.0)
Hemoglobin: 14.3 g/dL (ref 12.0–15.0)
O2 Saturation: 99 %
Potassium: 3.7 mmol/L (ref 3.5–5.1)
Sodium: 139 mmol/L (ref 135–145)
TCO2: 13 mmol/L — ABNORMAL LOW (ref 22–32)
pCO2, Ven: 32.1 mmHg — ABNORMAL LOW (ref 44.0–60.0)
pH, Ven: 7.199 — CL (ref 7.250–7.430)
pO2, Ven: 173 mmHg — ABNORMAL HIGH (ref 32.0–45.0)

## 2020-12-01 LAB — LIPASE, BLOOD: Lipase: 48 U/L (ref 11–51)

## 2020-12-01 LAB — I-STAT ARTERIAL BLOOD GAS, ED
Acid-base deficit: 8 mmol/L — ABNORMAL HIGH (ref 0.0–2.0)
Bicarbonate: 18.4 mmol/L — ABNORMAL LOW (ref 20.0–28.0)
Calcium, Ion: 1.2 mmol/L (ref 1.15–1.40)
HCT: 43 % (ref 36.0–46.0)
Hemoglobin: 14.6 g/dL (ref 12.0–15.0)
O2 Saturation: 100 %
Patient temperature: 98.5
Potassium: 4 mmol/L (ref 3.5–5.1)
Sodium: 140 mmol/L (ref 135–145)
TCO2: 20 mmol/L — ABNORMAL LOW (ref 22–32)
pCO2 arterial: 39.3 mmHg (ref 32.0–48.0)
pH, Arterial: 7.279 — ABNORMAL LOW (ref 7.350–7.450)
pO2, Arterial: 203 mmHg — ABNORMAL HIGH (ref 83.0–108.0)

## 2020-12-01 LAB — I-STAT CHEM 8, ED
BUN: 13 mg/dL (ref 6–20)
Calcium, Ion: 1.04 mmol/L — ABNORMAL LOW (ref 1.15–1.40)
Chloride: 109 mmol/L (ref 98–111)
Creatinine, Ser: 0.9 mg/dL (ref 0.44–1.00)
Glucose, Bld: 358 mg/dL — ABNORMAL HIGH (ref 70–99)
HCT: 42 % (ref 36.0–46.0)
Hemoglobin: 14.3 g/dL (ref 12.0–15.0)
Potassium: 3.6 mmol/L (ref 3.5–5.1)
Sodium: 138 mmol/L (ref 135–145)
TCO2: 14 mmol/L — ABNORMAL LOW (ref 22–32)

## 2020-12-01 LAB — RESP PANEL BY RT-PCR (FLU A&B, COVID) ARPGX2
Influenza A by PCR: NEGATIVE
Influenza B by PCR: NEGATIVE
SARS Coronavirus 2 by RT PCR: POSITIVE — AB

## 2020-12-01 LAB — URINALYSIS, ROUTINE W REFLEX MICROSCOPIC
Bilirubin Urine: NEGATIVE
Glucose, UA: 150 mg/dL — AB
Ketones, ur: NEGATIVE mg/dL
Leukocytes,Ua: NEGATIVE
Nitrite: NEGATIVE
Protein, ur: 100 mg/dL — AB
Specific Gravity, Urine: 1.014 (ref 1.005–1.030)
pH: 7 (ref 5.0–8.0)

## 2020-12-01 LAB — COMPREHENSIVE METABOLIC PANEL
ALT: 284 U/L — ABNORMAL HIGH (ref 0–44)
AST: 234 U/L — ABNORMAL HIGH (ref 15–41)
Albumin: 2.8 g/dL — ABNORMAL LOW (ref 3.5–5.0)
Alkaline Phosphatase: 110 U/L (ref 38–126)
Anion gap: 16 — ABNORMAL HIGH (ref 5–15)
BUN: 13 mg/dL (ref 6–20)
CO2: 11 mmol/L — ABNORMAL LOW (ref 22–32)
Calcium: 7.8 mg/dL — ABNORMAL LOW (ref 8.9–10.3)
Chloride: 109 mmol/L (ref 98–111)
Creatinine, Ser: 1.21 mg/dL — ABNORMAL HIGH (ref 0.44–1.00)
GFR, Estimated: 57 mL/min — ABNORMAL LOW (ref >60)
Glucose, Bld: 368 mg/dL — ABNORMAL HIGH (ref 70–99)
Potassium: 3.7 mmol/L (ref 3.5–5.1)
Sodium: 136 mmol/L (ref 135–145)
Total Bilirubin: 0.5 mg/dL (ref 0.3–1.2)
Total Protein: 5.7 g/dL — ABNORMAL LOW (ref 6.5–8.1)

## 2020-12-01 LAB — RAPID URINE DRUG SCREEN, HOSP PERFORMED
Amphetamines: NOT DETECTED
Barbiturates: NOT DETECTED
Benzodiazepines: NOT DETECTED
Cocaine: NOT DETECTED
Opiates: NOT DETECTED
Tetrahydrocannabinol: NOT DETECTED

## 2020-12-01 LAB — LACTIC ACID, PLASMA
Lactic Acid, Venous: <0.3 mmol/L — ABNORMAL LOW (ref 0.5–1.9)
Lactic Acid, Venous: 2.9 mmol/L (ref 0.5–1.9)

## 2020-12-01 LAB — D-DIMER, QUANTITATIVE: D-Dimer, Quant: 4.26 ug/mL-FEU — ABNORMAL HIGH (ref 0.00–0.50)

## 2020-12-01 LAB — HEPATITIS PANEL, ACUTE
HCV Ab: NONREACTIVE
Hep A IgM: NONREACTIVE
Hep B C IgM: NONREACTIVE
Hepatitis B Surface Ag: NONREACTIVE

## 2020-12-01 LAB — TROPONIN I (HIGH SENSITIVITY)
Troponin I (High Sensitivity): 65 ng/L — ABNORMAL HIGH (ref <18)
Troponin I (High Sensitivity): 5186 ng/L (ref <18)

## 2020-12-01 LAB — MAGNESIUM: Magnesium: 1.8 mg/dL (ref 1.7–2.4)

## 2020-12-01 LAB — FERRITIN: Ferritin: 3133 ng/mL — ABNORMAL HIGH (ref 11–307)

## 2020-12-01 LAB — C-REACTIVE PROTEIN: CRP: 0.7 mg/dL (ref <1.0)

## 2020-12-01 LAB — PROCALCITONIN: Procalcitonin: <0.1 ng/mL

## 2020-12-01 LAB — BRAIN NATRIURETIC PEPTIDE: B Natriuretic Peptide: 37.2 pg/mL (ref 0.0–100.0)

## 2020-12-01 LAB — HIV ANTIBODY (ROUTINE TESTING W REFLEX): HIV Screen 4th Generation wRfx: NONREACTIVE

## 2020-12-01 MED ORDER — PROPOFOL 10 MG/ML IV BOLUS
INTRAVENOUS | Status: AC
  Filled 2020-12-01: qty 20

## 2020-12-01 MED ORDER — LACTATED RINGERS IV SOLN: INTRAVENOUS | Status: DC

## 2020-12-01 MED ORDER — SODIUM CHLORIDE 0.9 % IV SOLN
3.0000 g | Freq: Four times a day (QID) | INTRAVENOUS | Status: DC
  Administered 2020-12-01 – 2020-12-02 (×3): 3 g via INTRAVENOUS
  Filled 2020-12-01 (×4): qty 8

## 2020-12-01 MED ORDER — FUROSEMIDE 10 MG/ML IJ SOLN
40.0000 mg | Freq: Once | INTRAMUSCULAR | Status: AC
  Administered 2020-12-01: 40 mg via INTRAVENOUS
  Filled 2020-12-01: qty 4

## 2020-12-01 MED ORDER — FENTANYL CITRATE PF 50 MCG/ML IJ SOSY: 50.0000 ug | PREFILLED_SYRINGE | Freq: Once | INTRAMUSCULAR | Status: DC

## 2020-12-01 MED ORDER — DOCUSATE SODIUM 50 MG/5ML PO LIQD: 100.0000 mg | Freq: Two times a day (BID) | ORAL | Status: DC | PRN

## 2020-12-01 MED ORDER — IOHEXOL 350 MG/ML SOLN
50.0000 mL | Freq: Once | INTRAVENOUS | Status: AC | PRN
  Administered 2020-12-01: 50 mL via INTRAVENOUS

## 2020-12-01 MED ORDER — LACTATED RINGERS IV BOLUS
1000.0000 mL | Freq: Once | INTRAVENOUS | Status: AC
  Administered 2020-12-01: 1000 mL via INTRAVENOUS

## 2020-12-01 MED ORDER — CHLORHEXIDINE GLUCONATE 0.12% ORAL RINSE (MEDLINE KIT)
15.0000 mL | Freq: Two times a day (BID) | OROMUCOSAL | Status: DC
Start: 2020-12-02 — End: 2020-12-07
  Administered 2020-12-02 – 2020-12-06 (×9): 15 mL via OROMUCOSAL

## 2020-12-01 MED ORDER — HEPARIN SODIUM (PORCINE) 5000 UNIT/ML IJ SOLN
5000.0000 [IU] | Freq: Three times a day (TID) | INTRAMUSCULAR | Status: DC
  Administered 2020-12-01 – 2020-12-02 (×3): 5000 [IU] via SUBCUTANEOUS
  Filled 2020-12-01 (×3): qty 1

## 2020-12-01 MED ORDER — POLYETHYLENE GLYCOL 3350 17 G PO PACK: 17.0000 g | PACK | Freq: Every day | ORAL | Status: DC | PRN

## 2020-12-01 MED ORDER — SODIUM CHLORIDE 0.9 % IV BOLUS
1000.0000 mL | Freq: Once | INTRAVENOUS | Status: AC
  Administered 2020-12-01: 1000 mL via INTRAVENOUS

## 2020-12-01 MED ORDER — CALCIUM GLUCONATE-NACL 2-0.675 GM/100ML-% IV SOLN
2.0000 g | Freq: Once | INTRAVENOUS | Status: AC
  Administered 2020-12-01: 2000 mg via INTRAVENOUS
  Filled 2020-12-01: qty 100

## 2020-12-01 MED ORDER — ALBUTEROL SULFATE (2.5 MG/3ML) 0.083% IN NEBU: 2.5000 mg | INHALATION_SOLUTION | RESPIRATORY_TRACT | Status: DC | PRN

## 2020-12-01 MED ORDER — PROPOFOL 1000 MG/100ML IV EMUL
0.0000 ug/kg/min | INTRAVENOUS | Status: DC
  Administered 2020-12-01: 25 ug/kg/min via INTRAVENOUS
  Administered 2020-12-01: 35 ug/kg/min via INTRAVENOUS
  Administered 2020-12-01: 20 ug/kg/min via INTRAVENOUS
  Administered 2020-12-02: 25 ug/kg/min via INTRAVENOUS
  Filled 2020-12-01 (×4): qty 100

## 2020-12-01 MED ORDER — ACETAMINOPHEN 325 MG PO TABS
650.0000 mg | ORAL_TABLET | ORAL | Status: DC | PRN
Start: 2020-12-01 — End: 2020-12-02

## 2020-12-01 MED ORDER — ONDANSETRON HCL 4 MG/2ML IJ SOLN
4.0000 mg | Freq: Four times a day (QID) | INTRAMUSCULAR | Status: DC | PRN
  Administered 2020-12-06: 4 mg via INTRAVENOUS
  Filled 2020-12-01: qty 2

## 2020-12-01 MED ORDER — PANTOPRAZOLE SODIUM 40 MG IV SOLR
40.0000 mg | Freq: Every day | INTRAVENOUS | Status: DC
  Administered 2020-12-01: 40 mg via INTRAVENOUS
  Filled 2020-12-01: qty 40

## 2020-12-01 MED ORDER — FENTANYL BOLUS VIA INFUSION
50.0000 ug | INTRAVENOUS | Status: DC | PRN
  Administered 2020-12-01: 50 ug via INTRAVENOUS
  Filled 2020-12-01: qty 100

## 2020-12-01 MED ORDER — VANCOMYCIN HCL 10 G IV SOLR
2000.0000 mg | Freq: Once | INTRAVENOUS | Status: DC
  Filled 2020-12-01: qty 20

## 2020-12-01 MED ORDER — ROCURONIUM BROMIDE 50 MG/5ML IV SOLN
INTRAVENOUS | Status: AC | PRN
  Administered 2020-12-01: 100 mg via INTRAVENOUS

## 2020-12-01 MED ORDER — ETOMIDATE 2 MG/ML IV SOLN
INTRAVENOUS | Status: AC | PRN
  Administered 2020-12-01: 30 mg via INTRAVENOUS

## 2020-12-01 MED ORDER — FENTANYL 2500MCG IN NS 250ML (10MCG/ML) PREMIX INFUSION
50.0000 ug/h | INTRAVENOUS | Status: DC
  Administered 2020-12-01: 50 ug/h via INTRAVENOUS
  Administered 2020-12-02: 150 ug/h via INTRAVENOUS
  Filled 2020-12-01 (×3): qty 250

## 2020-12-01 MED ORDER — SODIUM CHLORIDE 0.9 % IV SOLN: 2.0000 g | Freq: Three times a day (TID) | INTRAVENOUS | Status: DC

## 2020-12-01 MED ORDER — CHLORHEXIDINE GLUCONATE CLOTH 2 % EX PADS
6.0000 | MEDICATED_PAD | Freq: Every day | CUTANEOUS | Status: DC
  Administered 2020-12-03 – 2020-12-06 (×5): 6 via TOPICAL

## 2020-12-01 MED ORDER — ORAL CARE MOUTH RINSE
15.0000 mL | OROMUCOSAL | Status: DC
  Administered 2020-12-02 – 2020-12-06 (×44): 15 mL via OROMUCOSAL

## 2020-12-01 NOTE — ED Notes (Signed)
Called Xray to notify of STAT CXR for tube placement verification

## 2020-12-01 NOTE — ED Notes (Signed)
Temp foley to be placed as soon as some are obtained by supply coordinator.

## 2020-12-01 NOTE — ED Notes (Signed)
Attempted to call RT to request help transporting pt to CT. Will call again.

## 2020-12-01 NOTE — ED Notes (Signed)
Pt w/ purposeful movement, increased BP and HR and moving arms. Fentanyl bolus given per order for pt comfort while intubated and to maintain adequate sedation.

## 2020-12-01 NOTE — ED Notes (Signed)
Blood-like substance noted in suction canister hooked up to OG tube. Notified Karle Starch MD

## 2020-12-01 NOTE — Progress Notes (Signed)
*  PRELIMINARY RESULTS* Echocardiogram 2D Echocardiogram has been performed.  Luisa Hart RDCS 12/01/2020, 2:27 PM

## 2020-12-01 NOTE — ED Notes (Signed)
Called RT. RT on the way to help transport pt to CT

## 2020-12-01 NOTE — Code Documentation (Signed)
Pt intubated at this time. 7.5 tube, 25 at teeth, color change, bilateral breath sounds

## 2020-12-01 NOTE — Progress Notes (Signed)
Latex temp foley removed and exchanged with latex free foley upon arrival to CVICU by this RN

## 2020-12-01 NOTE — ED Notes (Signed)
Pt back in room from CT. Transport to and from CT uneventful.

## 2020-12-01 NOTE — ED Provider Notes (Signed)
[facility] EMERGENCY DEPARTMENT Provider Note   CSN: [id number] Arrival date & time: 12/01/20  0848     History Chief Complaint  Patient presents with   Cardiac Arrest    Courtney Davies is a 45 y.o. female.  Patient is a 45 year old female with a past medical history of hypertension and type 2 diabetes that is presenting as a post cardiac arrest.  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  She fell approximately 4 feet.  She was unresponsive.  When EMS arrived they found that her initial rhythm was ventricular fibrillation.  She received compressions, 5 rounds of epi, 5 episodes of defibrillation and given 400 mg of amiodarone and an epi drip was started.  Family of patient denies any recent fevers, chills, headache, chest pains, or recent illnesses.  Cardiac Arrest     No past medical history on file.  There are no problems to display for this patient.     OB History   No obstetric history on file.     No family history on file.     Home Medications Prior to Admission medications   Not on File    Allergies    Patient has no allergy information on record.  Review of Systems   Review of Systems  Unable to perform ROS: Intubated   Physical Exam Updated Vital Signs BP (!) 127/95   Pulse (!) 117   Resp 17   Wt 100 kg   SpO2 99%   Physical Exam Vitals and nursing note reviewed.  Constitutional:      General: She is in acute distress.     Appearance: She is obese. She is ill-appearing and toxic-appearing.  HENT:     Head: Normocephalic.     Comments: Abrasion to posterior aspect of the scalp. No signs of laceration, abrasion hemostatic.     Right Ear: External ear normal.     Left Ear: External ear normal.     Nose: Nose normal.     Mouth/Throat:     Comments: ET tube in place Eyes:     Extraocular Movements: Extraocular movements intact.     Conjunctiva/sclera:  Conjunctivae normal.     Pupils: Pupils are equal, round, and reactive to light.  Neck:     Vascular: No carotid bruit.  Cardiovascular:     Rate and Rhythm: Regular rhythm. Tachycardia present.     Pulses: Normal pulses.     Heart sounds: Normal heart sounds.  Pulmonary:     Comments: Mechanical breath sounds Abdominal:     General: Bowel sounds are normal. There is no distension.     Palpations: Abdomen is soft.     Tenderness: There is no guarding or rebound.  Musculoskeletal:        General: No swelling, deformity or signs of injury.     Cervical back: Neck supple. No rigidity.     Right lower leg: No edema.     Left lower leg: No edema.  Skin:    General: Skin is warm and dry.     Capillary Refill: Capillary refill takes less than 2 seconds.     Coloration: Skin is not jaundiced.     Findings: No bruising.  Neurological:     Comments: Intubated and sedated    ED Results / Procedures / Treatments   Labs (all labs ordered are listed, but only abnormal results are displayed) Labs Reviewed  CBC WITH DIFFERENTIAL/PLATELET - Abnormal; Notable for the following components:      Result Value   WBC 15.8 (*)    RBC 5.65 (*)    MCV 75.6 (*)    MCH 22.5 (*)    MCHC 29.7 (*)    RDW 15.9 (*)    All other components within normal limits  I-STAT CHEM 8, ED - Abnormal; Notable for the following components:   Glucose, Bld 358 (*)    Calcium, Ion 1.04 (*)    TCO2 14 (*)    All other components within normal limits  CULTURE, BLOOD (ROUTINE X 2)  CULTURE, BLOOD (ROUTINE X 2)  RESP PANEL BY RT-PCR (FLU A&B, COVID) ARPGX2  BRAIN NATRIURETIC PEPTIDE  COMPREHENSIVE METABOLIC PANEL  URINALYSIS, ROUTINE W REFLEX MICROSCOPIC  RAPID URINE DRUG SCREEN, HOSP PERFORMED  TROPONIN I (HIGH SENSITIVITY)    EKG None  Radiology No results found.  Procedures Procedure Name: Intubation Date/Time: 12/01/2020 9:31 AM Performed by: Doretha Sou, MD Pre-anesthesia Checklist: Patient  identified, Emergency Drugs available, Suction available, Patient being monitored and Timeout performed Oxygen Delivery Method: Ambu bag Preoxygenation: Pre-oxygenation with 100% oxygen Induction Type: Rapid sequence Laryngoscope Size: Glidescope and 3 Grade View: Grade I Tube size: 7.5 mm Number of attempts: 1 Placement Confirmation: ETT inserted through vocal cords under direct vision, Positive ETCO2, CO2 detector and Breath sounds checked- equal and bilateral Secured at: 23 cm Tube secured with: ETT holder Dental Injury: Teeth and Oropharynx as per pre-operative assessment  Comments: Patient arrived with a king airway in place. Removed king airway and placed a 7.5 cuffed ET tube with no complications.       Medications Ordered in ED Medications  propofol (DIPRIVAN) 1000 MG/100ML infusion (20 mcg/kg/min  100 kg Intravenous New Bag/Given 12/01/20 0906)  fentaNYL (SUBLIMAZE) injection 50 mcg (0 mcg Intravenous Hold 12/01/20 0917)  fentaNYL 2571mg in NS 2581m(1035mml) infusion-PREMIX (50 mcg/hr Intravenous New Bag/Given 12/01/20 0907)  fentaNYL (SUBLIMAZE) bolus via infusion 50-100 mcg (has no administration in time range)  etomidate (AMIDATE) injection (30 mg Intravenous Given 12/01/20 0859)  rocuronium (ZEMURON) injection (100 mg Intravenous Given 12/01/20 0859)    ED Course  I have reviewed the triage vital signs and the nursing notes.  Pertinent labs & imaging results that were available during my care of the patient were reviewed by me and considered in my medical decision making (see chart for details).    MDM Rules/Calculators/A&P                         Courtney Davies a 45 7o. femalewith a past medical history of hypertension and type 2 diabetes that is presenting as a post cardiac arrest. 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  When EMS arrived she was found to be in ventricular fibrillation.  She received compressions, epi x5,  defibrillation x5, 400 mg of amiodarone with ROSC. On arrival patient had a King airway in place.  On arrival to our ED, KinEdison Davies was exchanged for a 7.5 ET tube with no complications.  Please see intubation procedure note for more information.  Initial chest x-ray showed ET tube in right mainstem bronchus.  ET tube was pulled back to 23 at the teeth.  Prior to starting sedation patient had purposeful movement of her arms and attempting to bite the ET tube.  Patient was started on propofol and fentanyl.  Patient's EKG showed  sinus tachycardia.  Patient's physical exam is notable for bilateral pupils being equal and reactive to light.  OG tube was placed.  Her initial troponin was 65.  Her blood gas was notable for being acidotic.  Her creatinine was elevated at 1.21.  Her AST was elevated to 234.  Her glucose was elevated.  Her COVID test was positive.  UA showed no signs of infection.  UDS was negative.  Blood cultures were performed.  We spoke with family and they state that she has not been experiencing any recent illnesses or fevers prior to this incident.  She was at her baseline.    Due to patient falling there is concern for traumatic injuries.  Patient was placed in a cervical collar.  CT cervical spine showed no acute fracture.  CT head showed no acute intracranial abnormality.  Due to concern for pulmonary embolism, a CTA chest was performed.  CTA chest showed no acute pulmonary embolism, however, it showed bilateral lower lobe consolidations.  Vancomycin and cefepime were started.  CTA chest showed ET tube in right mainstem bronchus.  ET tube was retracted 2 cm.  Chest x-ray confirmed placement of ET tube.  Critical care was consulted and evaluated the patient.  Patient admitted to critical care service for further evaluation.  Patient states compliance and understanding of the plan. I explained labs and imaging to the patient. No further questions at this time from the patient.  The plan for  this patient was discussed with Dr. Karle Starch, who voiced agreement and who oversaw evaluation and treatment of this patient.   Final Clinical Impression(s) / ED Diagnoses Final diagnoses:  Cardiac arrest [facility])    Rx / DC Orders ED Discharge Orders     None        Doretha Sou, MD 12/01/20 1624    Truddie Hidden, MD 12/02/20 [phone]

## 2020-12-01 NOTE — H&P (Addendum)
NAME:  Courtney Davies, MRN:  [id number], DOB:  Dec 22, 1975, LOS: 0 ADMISSION DATE:  12/01/2020, CONSULTATION DATE:  12/01/20 REFERRING MD:  Karle Starch - EM, CHIEF COMPLAINT:  Vfib arrest   History of Present Illness:  45 yo F PMH HTN DM2 B12 def GERD OA presented to [facility] 8/18 as post arrest. 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 She was unresponsive and coworker who witnessed collapse called EMS. On EMS arrival she was in [place] Received 5x Epi 5x DF and 4052mamio with ROSC.  In ED, ECG without acute ST segment changes, read as old lateral and probable old anterolateral infarcts. King exchanged for ETT and pt started on fent prop.  CTA chest without PE (does show ETT in R main, which is interesting as the repeat CXR prior to CTA had improved positioning after retracting from initial R main placement) and CT H without acute intracranial abnormalities    Trop-I 65, BNP 37   Cr 1.2 AST 234 (alk phos and ALT normal)   LA is 0.3 which is unusual  ABG 7.199/32/173   COVID has resulted positive    PCCM consulted for admission in this setting    Pertinent  Medical History  B12 deficiency DM GERD Dysphagia  Bronchitis  OA  L knee pain  Significant Hospital Events: Including procedures, antibiotic start and stop dates in addition to other pertinent events   8/18 ED via EMS as Vfib arrest with ROSC, 5 cycles Epi 5x DF and 40028mmio. ECG without acute ST segment changes. COVID positive   Interim History / Subjective:  CTA with malpositioned ETT, after prior repositioning with satisfactory follow up placement   When sedation weaned moving extremities  Sounds like ETT is high; and the cuff is not optimally inflated  COVID has come back positive   Objective   Blood pressure (!) 129/94, pulse 91, temperature (!) 97.4 F (36.3 C), temperature source Axillary, resp. rate 18, weight 100 kg, SpO2 100 %.    Vent Mode: PRVC FiO2 (%):  [100 %] 100 % Set  Rate:  [18 bmp] 18 bmp Vt Set:  [550 mL] 550 mL PEEP:  [6 cmH20] 6 cmH20 Plateau Pressure:  [26 cmH20] 26 cmH20   Intake/Output Summary (Last 24 hours) at 12/01/2020 1139 Last data filed at 12/01/2020 1107 Gross per 24 hour  Intake 1033.74 ml  Output --  Net 1033.74 ml   Filed Weights   12/01/20 0900  Weight: 100 kg    Examination: General: Critically ill obese middle aged F intubated sedated NAD  HENT: NCAT ETT secure. Anicteric sclera trachea midline. Ccollar in place  Lungs: Crackles rhonchi and wheeze. Mechanically ventilated. Gurgling sounds.. High peaks  Cardiovascular: rr s1s2 cap refill brisk  Abdomen: Soft round ndnt  Extremities: No acute joint deformity. RLE I/O. No cyanosis or clubbing  Neuro: Sedated on 175m50ment and 25 prop. Opens eyes to painful stimuli. Moving RUE purposefully and BLE to pain. Pupils 2mm 60mponsive  GU: Foley with clear yellow urine   Resolved Hospital Problem list     Assessment & Plan:   Acute encephalopathy in setting of cardiac arrest -CNS depressing meds contributing -CT H without acute abnormality -has some purposeful seeming movement on sedation, hoping there us no[place]ore[place] anoxic component  -UDS neg  P -when up to ICU will wean sedation for neuro exam (with question of ETT position, defer sedation wean until airway optimized)   Cardiac arrest  with ROSC (reportedly Vfib) -wonder if this was truly VF or if PEA -ECGs, trops, and POCUS somewhat assuring against cardiac process, lytes are pretty normal. Wonder if was PEA- perhaps mediated by hypoxia 2/2 covid? P -cycle trops -will order a formal ECHO  -check mag -decision RE TTM when gets to ICU and neuro status determined  -recheck LA (seems unlikely to be 0.3 after 5rounds ACLS)  Acute respiratory failure requiring intubation COVID-19 infection  Malpositioned ETT  -unclear if pt has sx from [place] / clinical significance.  -Think there is a degree of acute pulm edema, likely  aspiration -ETT initially R main, follow up CXR ok, then CTA R main -CTA without PE  P -airborne contact  -repeat CXR STAT to determine if ETT needs to be repositioned -Inflating ETT cuff  -checking covid labs, not starting any covid therapies right now -Repeat ABG  -will start unasyn empirically  -lasix  -BDs  AKI  AGMA  P -trend UOP, renal indices   Elevated AST  -normal ALT Alk phos P -trend LFTs  -will check Hep panel  -check CK   Leukocytosis -does have COVID. Also likely a reactive component in setting of arrest. Do think probable aspiration  P -checking a PCT -will change vanc/cefepime to unasyn  -follow Bcx   Hypocalcemia  P -replace   DM2 with hyperglycemia P -SSI   Best Practice (right click and "Reselect all SmartList Selections" daily)   Diet/type: NPO DVT prophylaxis: prophylactic heparin  GI prophylaxis: PPI Lines: N/A Foley:  Yes, and it is still needed Code Status:  full code Last date of multidisciplinary goals of care discussion [pending]  Labs   CBC: Recent Labs  Lab 12/01/20 0913 12/01/20 0918 12/01/20 0940  WBC 15.8*  --   --   NEUTROABS 8.2*  --   --   HGB 12.7 14.3 14.3  HCT 42.7 42.0 42.0  MCV 75.6*  --   --   PLT 237  --   --     Basic Metabolic Panel: Recent Labs  Lab 12/01/20 0913 12/01/20 0918 12/01/20 0940  NA 136 138 139  K 3.7 3.6 3.7  CL 109 109  --   CO2 11*  --   --   GLUCOSE 368* 358*  --   BUN 13 13  --   CREATININE 1.21* 0.90  --   CALCIUM 7.8*  --   --    GFR: CrCl cannot be calculated (Unknown ideal weight.). Recent Labs  Lab 12/01/20 0913 12/01/20 1011  WBC 15.8*  --   LATICACIDVEN  --  <0.3*    Liver Function Tests: Recent Labs  Lab 12/01/20 0913  AST 234*  ALT <5  ALKPHOS 110  BILITOT 0.5  PROT 5.7*  ALBUMIN 2.8*   No results for input(s): LIPASE, AMYLASE in the last 168 hours. No results for input(s): AMMONIA in the last 168 hours.  ABG    Component Value Date/Time    HCO3 12.5 (L) 12/01/2020 0940   TCO2 13 (L) 12/01/2020 0940   ACIDBASEDEF 14.0 (H) 12/01/2020 0940   O2SAT 99.0 12/01/2020 0940     Coagulation Profile: No results for input(s): INR, PROTIME in the last 168 hours.  Cardiac Enzymes: No results for input(s): CKTOTAL, CKMB, CKMBINDEX, TROPONINI in the last 168 hours.  HbA1C: No results found for: HGBA1C  CBG: Recent Labs  Lab 12/01/20 1003  GLUCAP 249*    Review of Systems:   Unable to obtain, intubated and sedated  Past Medical History:  Gerd B12 def OA HTN DM2   Surgical History:    Bilat hernia repair C Section Tubal ligation Hysterectomy  L Knee Arthroscopy  Social History:      Family History:   Valvular heart disease DM HTN anemia Prostate cancer    Allergies Allergies  Allergen Reactions   Other Anaphylaxis and Hives    Hair Glue   Codeine Nausea And Vomiting   Latex Hives   Nitrofurantoin Rash     Home Medications  Prior to Admission medications   Medication Sig Start Date End Date Taking? Authorizing Provider  HYDROcodone-acetaminophen (NORCO) 7.5-325 MG tablet Take 1 tablet by mouth 3 (three) times daily as needed for pain. 11/07/20   [provider]  LINZESS 290 MCG CAPS capsule Take 290 mcg by mouth daily. 12/01/20   [provider]  PROAIR HFA 108 (90 Base) MCG/ACT inhaler Inhale 1-2 puffs into the lungs every 4 (four) hours as needed. 07/25/20   [provider]  tiZANidine (ZANAFLEX) 4 MG tablet Take 4 mg by mouth at bedtime. 09/27/20   [provider]  TRULICITY 4.5 ET/6.2OE SOPN Inject 4.5 mg into the skin once a week. 10/26/20   [provider]  Vitamin D, Ergocalciferol, (DRISDOL) 1.25 MG (50000 UNIT) CAPS capsule Take 50,000 Units by mouth once a week. 10/26/20   [provider]     Critical care time: 53 min      CRITICAL CARE Performed by: Cristal Generous   Total critical care time: 53 minutes  Critical care time was  exclusive of separately billable procedures and treating other patients. Critical care was necessary to treat or prevent imminent or life-threatening deterioration.  Critical care was time spent personally by me on the following activities: development of treatment plan with patient and/or surrogate as well as nursing, discussions with consultants, evaluation of patient's response to treatment, examination of patient, obtaining history from patient or surrogate, ordering and performing treatments and interventions, ordering and review of laboratory studies, ordering and review of radiographic studies, pulse oximetry and re-evaluation of patient's condition.  Eliseo Gum MSN, AGACNP-BC [place] for pager  12/01/2020, 1:20 PM

## 2020-12-01 NOTE — ED Notes (Signed)
Dezirae Service (Father) [phone] Pe[facility] [place] [facility]) [phone]

## 2020-12-01 NOTE — Progress Notes (Signed)
Patient was transported via ventilator with no complications. RT will continue to monitor.

## 2020-12-01 NOTE — ED Provider Notes (Signed)
Patient seen and examined, agree with assessment and plan by Resident. Patient arrives post-CPR, unwitnessed but not prolonged arrest, initially in vfib s/p amiodarone, epi and defibrillation. King switched to ETT on arrival. Has remained hemodynamically stable. Labs show metabolic acidosis, but normal Lactate. CT head neg. CTA chest without PE, there is signs of aspiration. Covid is positive, unclear if this is underlying cause, family states she has been feeling well lately.   CRITICAL CARE Performed by: Truddie Hidden Total critical care time: 60 minutes Critical care time was exclusive of separately billable procedures and treating other patients. Critical care was necessary to treat or prevent imminent or life-threatening deterioration. Critical care was time spent personally by me on the following activities: development of treatment plan with patient and/or surrogate as well as nursing, discussions with consultants, evaluation of patient's response to treatment, examination of patient, obtaining history from patient or surrogate, ordering and performing treatments and interventions, ordering and review of laboratory studies, ordering and review of radiographic studies, pulse oximetry and re-evaluation of patient's condition.    Truddie Hidden, MD 12/02/20 [phone]

## 2020-12-01 NOTE — ED Notes (Signed)
Latex allergy noted in the chart at this time. Temp foley was initiated in ED that was not latex free. No hives or other allergic reaction noted to pt groin. Per 2H charge RN, they will change foley out to a temp non-latex foley once pt on unit.

## 2020-12-01 NOTE — Progress Notes (Signed)
Arterial blood gas as follows: PH:7.27 PCO2: 39.4 PO2:204 HCO3: 18.4

## 2020-12-01 NOTE — ED Triage Notes (Signed)
Pt arrived to ED via EMS from work as post cardiac arrest. 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 Pt fell approximately 43f. Lac to posterior head noted by EMS. EMS reports initial rhythm was V-fib. Pt received 5 shocks, 4563mamio, 5 epi's, and an epi gtt was initiated. Pt arrived w/ 18g L AC and IO to R tibia. EMS VS: BP initially 92/palp, last BP 144/110, HR 125, CBG 139. EMS reports pt hx of HTN and DM. EMS reports pt was grimacing w/ ventilation and having purposeful movement and nonreactive pupils.

## 2020-12-01 NOTE — ED Notes (Signed)
Pt transported to CT at this time.

## 2020-12-01 NOTE — ED Notes (Signed)
Attempted report x1. 

## 2020-12-01 NOTE — Progress Notes (Signed)
Pt was transported to [place] #9 via ventilator with no complications. RT will continue to monitor

## 2020-12-01 NOTE — ED Notes (Signed)
Echo at bedside

## 2020-12-01 NOTE — ED Notes (Signed)
Corrected ALT 284 instead of <5 per lab.

## 2020-12-01 NOTE — Progress Notes (Signed)
Pharmacy Antibiotic Note  Courtney Davies is a 45 y.o. female admitted on 12/01/2020 with sepsis.  Pharmacy has been consulted for Unasyn dosing.  Plan: Unasyn 3g IV q6h -Monitor renal function, clinical status, and antibiotic plan  Height: 5' 6"  (167.6 cm) Weight: 100 kg (220 lb 7.4 oz) IBW/kg (Calculated) : 59.3  Temp (24hrs), Avg:97.4 F (36.3 C), Min:97.4 F (36.3 C), Max:97.4 F (36.3 C)  Recent Labs  Lab 12/01/20 0913 12/01/20 0918 12/01/20 1011  WBC 15.8*  --   --   CREATININE 1.21* 0.90  --   LATICACIDVEN  --   --  <0.3*    Estimated Creatinine Clearance: 95.2 mL/min (by C-G formula based on SCr of 0.9 mg/dL).    Allergies  Allergen Reactions   Other Anaphylaxis and Hives    Hair Glue   Codeine Nausea And Vomiting   Latex Hives   Nitrofurantoin Rash   Antimicrobials this admission: Unasyn 8/18 >>   Dose adjustments this admission: N/A  Microbiology results 8/18 BCx: pending  Thank you for allowing pharmacy to be a part of this patient's care.  Joetta Manners, PharmD, [facility] Emergency Medicine Clinical Pharmacist ED RPh Phone: [place]: [phone]

## 2020-12-02 ENCOUNTER — Inpatient Hospital Stay (HOSPITAL_COMMUNITY): Payer: Medicaid Other

## 2020-12-02 ENCOUNTER — Encounter (HOSPITAL_COMMUNITY): Payer: Self-pay | Admitting: Pulmonary Disease

## 2020-12-02 DIAGNOSIS — I10 Essential (primary) hypertension: Secondary | ICD-10-CM

## 2020-12-02 DIAGNOSIS — K219 Gastro-esophageal reflux disease without esophagitis: Secondary | ICD-10-CM

## 2020-12-02 DIAGNOSIS — E119 Type 2 diabetes mellitus without complications: Secondary | ICD-10-CM | POA: Diagnosis not present

## 2020-12-02 DIAGNOSIS — D649 Anemia, unspecified: Secondary | ICD-10-CM

## 2020-12-02 DIAGNOSIS — E109 Type 1 diabetes mellitus without complications: Secondary | ICD-10-CM

## 2020-12-02 DIAGNOSIS — I469 Cardiac arrest, cause unspecified: Secondary | ICD-10-CM | POA: Diagnosis not present

## 2020-12-02 LAB — COMPREHENSIVE METABOLIC PANEL
ALT: 232 U/L — ABNORMAL HIGH (ref 0–44)
AST: 144 U/L — ABNORMAL HIGH (ref 15–41)
Albumin: 3 g/dL — ABNORMAL LOW (ref 3.5–5.0)
Alkaline Phosphatase: 94 U/L (ref 38–126)
Anion gap: 9 (ref 5–15)
BUN: 16 mg/dL (ref 6–20)
CO2: 19 mmol/L — ABNORMAL LOW (ref 22–32)
Calcium: 8.4 mg/dL — ABNORMAL LOW (ref 8.9–10.3)
Chloride: 112 mmol/L — ABNORMAL HIGH (ref 98–111)
Creatinine, Ser: 1.23 mg/dL — ABNORMAL HIGH (ref 0.44–1.00)
GFR, Estimated: 56 mL/min — ABNORMAL LOW (ref >60)
Glucose, Bld: 140 mg/dL — ABNORMAL HIGH (ref 70–99)
Potassium: 3.4 mmol/L — ABNORMAL LOW (ref 3.5–5.1)
Sodium: 140 mmol/L (ref 135–145)
Total Bilirubin: 0.8 mg/dL (ref 0.3–1.2)
Total Protein: 5.9 g/dL — ABNORMAL LOW (ref 6.5–8.1)

## 2020-12-02 LAB — HEMOGLOBIN A1C
Hgb A1c MFr Bld: 6.9 % — ABNORMAL HIGH (ref 4.8–5.6)
Mean Plasma Glucose: 151.33 mg/dL

## 2020-12-02 LAB — GLUCOSE, CAPILLARY
Glucose-Capillary: 128 mg/dL — ABNORMAL HIGH (ref 70–99)
Glucose-Capillary: 155 mg/dL — ABNORMAL HIGH (ref 70–99)
Glucose-Capillary: 200 mg/dL — ABNORMAL HIGH (ref 70–99)
Glucose-Capillary: 162 mg/dL — ABNORMAL HIGH (ref 70–99)

## 2020-12-02 LAB — CBC
HCT: 40.7 % (ref 36.0–46.0)
Hemoglobin: 12.8 g/dL (ref 12.0–15.0)
MCH: 22.8 pg — ABNORMAL LOW (ref 26.0–34.0)
MCHC: 31.4 g/dL (ref 30.0–36.0)
MCV: 72.5 fL — ABNORMAL LOW (ref 80.0–100.0)
Platelets: 210 10*3/uL (ref 150–400)
RBC: 5.61 MIL/uL — ABNORMAL HIGH (ref 3.87–5.11)
RDW: 16.3 % — ABNORMAL HIGH (ref 11.5–15.5)
WBC: 8.8 10*3/uL (ref 4.0–10.5)
nRBC: 0 % (ref 0.0–0.2)

## 2020-12-02 LAB — HEPARIN LEVEL (UNFRACTIONATED): Heparin Unfractionated: 0.37 IU/mL (ref 0.30–0.70)

## 2020-12-02 LAB — LIPID PANEL
Cholesterol: 163 mg/dL (ref 0–200)
HDL: 33 mg/dL — ABNORMAL LOW (ref >40)
LDL Cholesterol: 90 mg/dL (ref 0–99)
Total CHOL/HDL Ratio: 4.9 RATIO
Triglycerides: 199 mg/dL — ABNORMAL HIGH (ref <150)
VLDL: 40 mg/dL (ref 0–40)

## 2020-12-02 LAB — TROPONIN I (HIGH SENSITIVITY): Troponin I (High Sensitivity): 3074 ng/L (ref <18)

## 2020-12-02 LAB — PHOSPHORUS
Phosphorus: 4.6 mg/dL (ref 2.5–4.6)
Phosphorus: 4.7 mg/dL — ABNORMAL HIGH (ref 2.5–4.6)

## 2020-12-02 LAB — TRIGLYCERIDES: Triglycerides: 181 mg/dL — ABNORMAL HIGH (ref <150)

## 2020-12-02 LAB — MAGNESIUM
Magnesium: 1.6 mg/dL — ABNORMAL LOW (ref 1.7–2.4)
Magnesium: 2.4 mg/dL (ref 1.7–2.4)

## 2020-12-02 LAB — MRSA NEXT GEN BY PCR, NASAL: MRSA by PCR Next Gen: NOT DETECTED

## 2020-12-02 MED ORDER — AMIODARONE HCL IN DEXTROSE 360-4.14 MG/200ML-% IV SOLN
60.0000 mg/h | INTRAVENOUS | Status: DC
  Administered 2020-12-02: 60 mg/h via INTRAVENOUS
  Filled 2020-12-02: qty 200

## 2020-12-02 MED ORDER — MIDAZOLAM HCL 2 MG/2ML IJ SOLN
INTRAMUSCULAR | Status: AC
  Administered 2020-12-02: 2 mg
  Filled 2020-12-02: qty 2

## 2020-12-02 MED ORDER — VITAL AF 1.2 CAL PO LIQD
1000.0000 mL | ORAL | Status: DC
  Administered 2020-12-02 – 2020-12-04 (×3): 1000 mL

## 2020-12-02 MED ORDER — FENTANYL CITRATE (PF) 100 MCG/2ML IJ SOLN
50.0000 ug | Freq: Once | INTRAMUSCULAR | Status: DC
  Filled 2020-12-02: qty 2

## 2020-12-02 MED ORDER — MAGNESIUM SULFATE 4 GM/100ML IV SOLN
4.0000 g | Freq: Once | INTRAVENOUS | Status: AC
  Administered 2020-12-02: 4 g via INTRAVENOUS
  Filled 2020-12-02: qty 100

## 2020-12-02 MED ORDER — HEPARIN (PORCINE) 25000 UT/250ML-% IV SOLN
1500.0000 [IU]/h | INTRAVENOUS | Status: DC
  Administered 2020-12-02 – 2020-12-04 (×3): 1400 [IU]/h via INTRAVENOUS
  Administered 2020-12-04 – 2020-12-06 (×4): 1500 [IU]/h via INTRAVENOUS
  Filled 2020-12-02 (×9): qty 250

## 2020-12-02 MED ORDER — INSULIN ASPART 100 UNIT/ML IJ SOLN
0.0000 [IU] | INTRAMUSCULAR | Status: DC
  Administered 2020-12-02 (×3): 3 [IU] via SUBCUTANEOUS
  Administered 2020-12-02 – 2020-12-03 (×6): 2 [IU] via SUBCUTANEOUS
  Administered 2020-12-04: 3 [IU] via SUBCUTANEOUS
  Administered 2020-12-04: 2 [IU] via SUBCUTANEOUS
  Administered 2020-12-04 (×3): 3 [IU] via SUBCUTANEOUS
  Administered 2020-12-04: 2 [IU] via SUBCUTANEOUS
  Administered 2020-12-05 (×2): 3 [IU] via SUBCUTANEOUS
  Administered 2020-12-05: 2 [IU] via SUBCUTANEOUS

## 2020-12-02 MED ORDER — BUSPIRONE HCL 10 MG PO TABS
30.0000 mg | ORAL_TABLET | Freq: Three times a day (TID) | ORAL | Status: DC
  Administered 2020-12-02 – 2020-12-06 (×12): 30 mg
  Filled 2020-12-02 (×12): qty 3

## 2020-12-02 MED ORDER — AMIODARONE HCL IN DEXTROSE 360-4.14 MG/200ML-% IV SOLN
60.0000 mg/h | INTRAVENOUS | Status: AC
  Administered 2020-12-02: 60 mg/h via INTRAVENOUS
  Filled 2020-12-02: qty 200

## 2020-12-02 MED ORDER — ACETAMINOPHEN 325 MG PO TABS
650.0000 mg | ORAL_TABLET | Freq: Four times a day (QID) | ORAL | Status: DC
  Filled 2020-12-02: qty 2

## 2020-12-02 MED ORDER — MAGNESIUM SULFATE 4 GM/100ML IV SOLN: 4.0000 g | Freq: Once | INTRAVENOUS | Status: DC

## 2020-12-02 MED ORDER — ETOMIDATE 2 MG/ML IV SOLN
INTRAVENOUS | Status: AC
  Administered 2020-12-02: 20 mg
  Filled 2020-12-02: qty 20

## 2020-12-02 MED ORDER — PANTOPRAZOLE SODIUM 40 MG PO TBEC: 40.0000 mg | DELAYED_RELEASE_TABLET | Freq: Every day | ORAL | Status: DC

## 2020-12-02 MED ORDER — HEPARIN BOLUS VIA INFUSION
3000.0000 [IU] | Freq: Once | INTRAVENOUS | Status: AC
  Administered 2020-12-02: 3000 [IU] via INTRAVENOUS
  Filled 2020-12-02: qty 3000

## 2020-12-02 MED ORDER — POLYETHYLENE GLYCOL 3350 17 G PO PACK
17.0000 g | PACK | Freq: Every day | ORAL | Status: DC
Start: 2020-12-02 — End: 2020-12-06
  Administered 2020-12-05 – 2020-12-06 (×2): 17 g
  Filled 2020-12-02 (×2): qty 1

## 2020-12-02 MED ORDER — AMIODARONE HCL IN DEXTROSE 360-4.14 MG/200ML-% IV SOLN
30.0000 mg/h | INTRAVENOUS | Status: DC
  Administered 2020-12-02: 60 mg/h via INTRAVENOUS
  Filled 2020-12-02 (×2): qty 200

## 2020-12-02 MED ORDER — AMIODARONE LOAD VIA INFUSION
150.0000 mg | Freq: Once | INTRAVENOUS | Status: AC
  Administered 2020-12-02: 150 mg via INTRAVENOUS
  Filled 2020-12-02: qty 83.34

## 2020-12-02 MED ORDER — DOCUSATE SODIUM 50 MG/5ML PO LIQD
100.0000 mg | Freq: Two times a day (BID) | ORAL | Status: DC
  Administered 2020-12-02 – 2020-12-06 (×4): 100 mg
  Filled 2020-12-02 (×4): qty 10

## 2020-12-02 MED ORDER — FENTANYL CITRATE (PF) 100 MCG/2ML IJ SOLN
100.0000 ug | Freq: Once | INTRAMUSCULAR | Status: AC
  Administered 2020-12-02: 100 ug via INTRAVENOUS

## 2020-12-02 MED ORDER — FENTANYL 2500MCG IN NS 250ML (10MCG/ML) PREMIX INFUSION
0.0000 ug/h | INTRAVENOUS | Status: DC
  Administered 2020-12-02: 100 ug/h via INTRAVENOUS
  Administered 2020-12-03: 150 ug/h via INTRAVENOUS
  Administered 2020-12-03: 200 ug/h via INTRAVENOUS
  Administered 2020-12-04: 375 ug/h via INTRAVENOUS
  Administered 2020-12-04: 300 ug/h via INTRAVENOUS
  Administered 2020-12-05 (×2): 375 ug/h via INTRAVENOUS
  Filled 2020-12-02 (×7): qty 250

## 2020-12-02 MED ORDER — AMIODARONE HCL 200 MG PO TABS: 400.0000 mg | ORAL_TABLET | Freq: Two times a day (BID) | ORAL | Status: DC

## 2020-12-02 MED ORDER — PROPOFOL 1000 MG/100ML IV EMUL
0.0000 ug/kg/min | INTRAVENOUS | Status: DC
  Administered 2020-12-02: 40 ug/kg/min via INTRAVENOUS
  Administered 2020-12-02: 10 ug/kg/min via INTRAVENOUS
  Administered 2020-12-03: 40 ug/kg/min via INTRAVENOUS
  Administered 2020-12-03 (×3): 30 ug/kg/min via INTRAVENOUS
  Administered 2020-12-04 (×2): 60 ug/kg/min via INTRAVENOUS
  Administered 2020-12-04: 50 ug/kg/min via INTRAVENOUS
  Administered 2020-12-04 – 2020-12-05 (×4): 60 ug/kg/min via INTRAVENOUS
  Administered 2020-12-05: 30 ug/kg/min via INTRAVENOUS
  Administered 2020-12-05: 60 ug/kg/min via INTRAVENOUS
  Filled 2020-12-02 (×11): qty 100
  Filled 2020-12-02: qty 200
  Filled 2020-12-02 (×8): qty 100

## 2020-12-02 MED ORDER — METHYLPREDNISOLONE SODIUM SUCC 40 MG IJ SOLR
40.0000 mg | Freq: Every day | INTRAMUSCULAR | Status: DC
  Administered 2020-12-02 – 2020-12-06 (×5): 40 mg via INTRAVENOUS
  Filled 2020-12-02 (×5): qty 1

## 2020-12-02 MED ORDER — POTASSIUM CHLORIDE 20 MEQ PO PACK
40.0000 meq | PACK | Freq: Once | ORAL | Status: AC
  Administered 2020-12-02: 40 meq
  Filled 2020-12-02: qty 2

## 2020-12-02 MED ORDER — SODIUM CHLORIDE 0.9 % IV SOLN
2.0000 g | INTRAVENOUS | Status: DC
  Administered 2020-12-02 – 2020-12-03 (×2): 2 g via INTRAVENOUS
  Filled 2020-12-02 (×2): qty 20

## 2020-12-02 MED ORDER — BUSPIRONE HCL 10 MG PO TABS: 30.0000 mg | ORAL_TABLET | Freq: Three times a day (TID) | ORAL | Status: DC

## 2020-12-02 MED ORDER — AMIODARONE HCL IN DEXTROSE 360-4.14 MG/200ML-% IV SOLN: 30.0000 mg/h | INTRAVENOUS | Status: DC

## 2020-12-02 MED ORDER — ACETAMINOPHEN 160 MG/5ML PO SOLN
650.0000 mg | Freq: Four times a day (QID) | ORAL | Status: DC
  Administered 2020-12-02 – 2020-12-24 (×78): 650 mg
  Filled 2020-12-02 (×80): qty 20.3

## 2020-12-02 MED ORDER — ROCURONIUM BROMIDE 10 MG/ML (PF) SYRINGE
PREFILLED_SYRINGE | INTRAVENOUS | Status: AC
  Administered 2020-12-02: 50 mg
  Filled 2020-12-02: qty 10

## 2020-12-02 MED ORDER — FENTANYL BOLUS VIA INFUSION
50.0000 ug | INTRAVENOUS | Status: DC | PRN
  Administered 2020-12-02 – 2020-12-04 (×4): 100 ug via INTRAVENOUS
  Filled 2020-12-02: qty 100

## 2020-12-02 MED ORDER — FENTANYL CITRATE (PF) 100 MCG/2ML IJ SOLN
INTRAMUSCULAR | Status: AC
  Administered 2020-12-02: 100 ug
  Filled 2020-12-02: qty 2

## 2020-12-02 NOTE — Progress Notes (Signed)
Extubated, opens eyes to voice but follows no commands, grimaces to pain Progressively worsening ability to handle secretions, coughs but not clearing airway Not really sure she needs the C collar as CT has a >99% NPV but defer to institutional protocol. Also became increasingly febrile; normothermia with arctic sun initiated.   Needs a few more days before neuroprognostication, re-intubated.  Plan to lighten sedation Sunday and check MRI if still question of degree of anoxic injury. Father updated at bedside at length.   Additional 45 min cc time independent of procedures

## 2020-12-02 NOTE — Consult Note (Addendum)
Cardiology Consultation:   Due to the COVID-19 pandemic, this visit was completed with telemedicine (audio/video) technology to reduce patient and provider exposure as well as to preserve personal protective equipment.   Patient ID: Courtney Davies MRN: [id number]; DOB: 1975-05-27  Admit date: 12/01/2020 Date of Consult: 12/02/2020  Primary Care Provider: Pcp, No Primary Cardiologist: New Primary Electrophysiologist:  None    Patient Profile:   Courtney Davies is a 45 y.o. female with a hx of HTN, DM2, GERD, OA, B12 deficiency anemia/thalassemia who is being seen today for the evaluation of cardiac arrest at the request of Dr. Tamala Julian.  History of Present Illness:   Courtney Davies was brought to the ER at [facility] for witnessed cardiac arrest at work. 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 When EMS arrived she was in VFib. CPR was initiated, she received 5 rounds of epi, 5 episodes of defib and given 460m amiodarone and epi drip was started with achievement of ROSC. On arrival to the ED she was intubated. EKG showed ST. HS trop 65. Acidotic on blood gas. Scr 1.21, BUN 13, AST 234, ALT 284, LA 0.3>2.p, K 3.7, sodium 136, calcium 7.8, albumin 2.8, Hgb 14.6 . COVID positive. CT head/spine showed no acute abnormality. CTA chest showed no PE, but did show bilateral lower lobe consolidations. She was started on IV abx and admitted.   HS trop 65 BNP 37 Scr 1.2 AST 234 COVID positive UDS negative  Patient chart marked for merger. In other chart, no prior cardiac history, has not seen cardiology in the past. Echo in 2015 obtained for LLE and anemia.   Echo 2015 Study Conclusions  - Left ventricle: The cavity size was normal. Systolic function was    normal. The estimated ejection fraction was in the range of 55%    to 60%. Wall motion was normal; there were no regional wall    motion abnormalities.  - Mitral valve: There was moderate  regurgitation.  - Left atrium: The atrium was moderately to severely dilated.    Past Medical History:  Diagnosis Date   Anemia    Diabetes type 2, controlled ([place]    GERD (gastroesophageal reflux disease)    Hypertension        Home Medications:  Prior to Admission medications   Medication Sig Start Date End Date Taking? Authorizing Provider  folic acid (FOLVITE) 1 MG tablet Take 1 mg by mouth daily.    [provider]  HYDROcodone-acetaminophen (NORCO) 7.5-325 MG tablet Take 1 tablet by mouth 3 (three) times daily as needed for pain. 11/07/20   [provider]  LINZESS 290 MCG CAPS capsule Take 290 mcg by mouth daily. 12/01/20   [provider]  meloxicam (MOBIC) 15 MG tablet Take 15 mg by mouth daily. 06/07/20   [provider]  nirmatrelvir & ritonavir (PAXLOVID) 20 x 150 MG & 10 x 100MG TBPK Take 100-150 mg by mouth See admin instructions. follow package directions 11/10/20   [provider]  pantoprazole (PROTONIX) 40 MG tablet Take 40 mg by mouth 2 (two) times daily. 07/07/20   [provider]  phentermine (ADIPEX-P) 37.5 MG tablet Take 37.5 mg by mouth daily. 07/12/20   [provider]  PROAIR HFA 108 (90 Base) MCG/ACT inhaler Inhale 1-2 puffs into the lungs every 4 (four) hours as needed. 07/25/20   [provider]  tiZANidine (ZANAFLEX) 4 MG tablet Take  4 mg by mouth at bedtime. 09/27/20   [provider]  TRULICITY 4.5 HY/0.7PX SOPN Inject 4.5 mg into the skin once a week. 10/26/20   [provider]  Vitamin D, Ergocalciferol, (DRISDOL) 1.25 MG (50000 UNIT) CAPS capsule Take 50,000 Units by mouth once a week. 10/26/20   [provider]    Inpatient Medications: Scheduled Meds:  acetaminophen  650 mg Per Tube Q6H   amiodarone  150 mg Intravenous Once   chlorhexidine gluconate (MEDLINE KIT)  15 mL Mouth Rinse BID   Chlorhexidine Gluconate Cloth  6 each Topical Daily   fentaNYL  (SUBLIMAZE) injection  50 mcg Intravenous Once   insulin aspart  0-15 Units Subcutaneous Q4H   mouth rinse  15 mL Mouth Rinse 10 times per day   methylPREDNISolone (SOLU-MEDROL) injection  40 mg Intravenous Daily   [START ON 12/03/2020] pantoprazole  40 mg Oral Daily   Continuous Infusions:  amiodarone 60 mg/hr (12/02/20 1406)   amiodarone     cefTRIAXone (ROCEPHIN)  IV Stopped (12/02/20 1025)   fentaNYL infusion INTRAVENOUS Stopped (12/02/20 1054)   heparin 1,400 Units/hr (12/02/20 1239)   lactated ringers Stopped (12/02/20 0922)   propofol (DIPRIVAN) infusion 10 mcg/kg/min (12/02/20 1100)   PRN Meds: albuterol, docusate, fentaNYL, ondansetron (ZOFRAN) IV, polyethylene glycol  Allergies:    Allergies  Allergen Reactions   Other Anaphylaxis and Hives    Hair Glue   Codeine Nausea And Vomiting   Latex Hives   Nitrofurantoin Rash    Social History:   Social History   Socioeconomic History   Marital status: Single    Spouse name: Not on file   Number of children: Not on file   Years of education: Not on file   Highest education level: Not on file  Occupational History   Not on file  Tobacco Use   Smoking status: Not on file   Smokeless tobacco: Not on file  Substance and Sexual Activity   Alcohol use: Not on file   Drug use: Not on file   Sexual activity: Not on file  Other Topics Concern   Not on file  Social History Narrative   Not on file   Social Determinants of Health   Financial Resource Strain: Not on file  Food Insecurity: Not on file  Transportation Needs: Not on file  Physical Activity: Not on file  Stress: Not on file  Social Connections: Not on file  Intimate Partner Violence: Not on file    Family History:   No family history on file.   ROS:  Please see the history of present illness.   All other ROS reviewed and negative.     Physical Exam/Data:   Vitals:   12/02/20 1202 12/02/20 1215 12/02/20 1230 12/02/20 1245  BP: (!) 152/81 138/81  (!) 148/98 (!) 146/98  Pulse: (!) 120 (!) 115 95 99  Resp: (!) 22 19 17 15   Temp:      TempSrc:      SpO2: 96% 95% 94% 95%  Weight:      Height:        Intake/Output Summary (Last 24 hours) at 12/02/2020 1412 Last data filed at 12/02/2020 1127 Gross per 24 hour  Intake 1418.77 ml  Output 4350 ml  Net -2931.23 ml   Last 3 Weights 12/02/2020 12/01/2020  Weight (lbs) 223 lb 15.8 oz 220 lb 7.4 oz  Weight (kg) 101.6 kg 100 kg     Body mass index is 36.15 kg/m.  VITAL SIGNS:  reviewed  EKG:  The EKG was personally reviewed and demonstrates: ST, 115bpm, q waves V1 and V2 Telemetry:  Telemetry was personally reviewed and demonstrates:  ST, PACs, PVCs, HR 110-120, 3 beats NSVT  Relevant CV Studies:  Echo 12/02/20  1. Left ventricular ejection fraction, by estimation, is 40 to 45%. The  left ventricle has mildly decreased function. The left ventricle  demonstrates global hypokinesis. There is mild left ventricular  hypertrophy. Left ventricular diastolic parameters  are consistent with Grade I diastolic dysfunction (impaired relaxation).   2. Right ventricular systolic function is normal. The right ventricular  size is normal. There is normal pulmonary artery systolic pressure.   3. The mitral valve is normal in structure. Mild to moderate mitral valve  regurgitation. No evidence of mitral stenosis.   4. The aortic valve was not well visualized. Aortic valve regurgitation  is not visualized. Mild aortic valve sclerosis is present, with no  evidence of aortic valve stenosis.    Echo 2015 Study Conclusions   - Left ventricle: The cavity size was normal. Systolic function was    normal. The estimated ejection fraction was in the range of 55%    to 60%. Wall motion was normal; there were no regional wall    motion abnormalities.  - Mitral valve: There was moderate regurgitation.  - Left atrium: The atrium was moderately to severely dilated.    Laboratory  Data:  Chemistry Recent Labs  Lab 12/01/20 0913 12/01/20 0918 12/01/20 0940 12/01/20 1256 12/02/20 0450  NA 136 138 139 140 140  K 3.7 3.6 3.7 4.0 3.4*  CL 109 109  --   --  112*  CO2 11*  --   --   --  19*  GLUCOSE 368* 358*  --   --  140*  BUN 13 13  --   --  16  CREATININE 1.21* 0.90  --   --  1.23*  CALCIUM 7.8*  --   --   --  8.4*  GFRNONAA 57*  --   --   --  56*  ANIONGAP 16*  --   --   --  9    Recent Labs  Lab 12/01/20 0913 12/02/20 0450  PROT 5.7* 5.9*  ALBUMIN 2.8* 3.0*  AST 234* 144*  ALT 284* 232*  ALKPHOS 110 94  BILITOT 0.5 0.8   Hematology Recent Labs  Lab 12/01/20 0913 12/01/20 0918 12/01/20 0940 12/01/20 1256 12/02/20 0450  WBC 15.8*  --   --   --  8.8  RBC 5.65*  --   --   --  5.61*  HGB 12.7   < > 14.3 14.6 12.8  HCT 42.7   < > 42.0 43.0 40.7  MCV 75.6*  --   --   --  72.5*  MCH 22.5*  --   --   --  22.8*  MCHC 29.7*  --   --   --  31.4  RDW 15.9*  --   --   --  16.3*  PLT 237  --   --   --  210   < > = values in this interval not displayed.   Cardiac EnzymesNo results for input(s): TROPONINI in the last 168 hours. No results for input(s): TROPIPOC in the last 168 hours.  BNP Recent Labs  Lab 12/01/20 0913  BNP 37.2    DDimer  Recent Labs  Lab 12/01/20 0913  DDIMER 4.26*    Radiology/Studies:  DG Abd 1 View  Result Date: 12/01/2020 CLINICAL DATA:  Nasogastric tube placement. EXAM: ABDOMEN - 1 VIEW COMPARISON:  None. FINDINGS: Tip and side port of the enteric tube below the diaphragm in the stomach. There is a general paucity of bowel gas in the included abdomen. IMPRESSION: Tip and side port of the enteric tube below the diaphragm in the stomach. Electronically Signed   By: Keith Rake M.D.   On: 12/01/2020 19:40   CT HEAD WO CONTRAST (5MM)  Result Date: 12/01/2020 CLINICAL DATA:  Fall. EXAM: CT HEAD WITHOUT CONTRAST CT CERVICAL SPINE WITHOUT CONTRAST TECHNIQUE: Multidetector CT imaging of the head and cervical spine was  performed following the standard protocol without intravenous contrast. Multiplanar CT image reconstructions of the cervical spine were also generated. COMPARISON:  None. FINDINGS: CT HEAD FINDINGS Brain: No evidence of acute infarction, hemorrhage, hydrocephalus, extra-axial collection or mass lesion/mass effect. Vascular: No hyperdense vessel or unexpected calcification. Skull: Normal. Negative for fracture or focal lesion. Sinuses/Orbits: Secretions in the nasal cavity and nasopharynx. Clear sinuses and mastoid air cells. The orbits are unremarkable. Other: None. CT CERVICAL SPINE FINDINGS Alignment: Straightening and slight reversal of the normal cervical lordosis. No listhesis. Skull base and vertebrae: No acute fracture. No primary bone lesion or focal pathologic process. Soft tissues and spinal canal: No prevertebral fluid or swelling. No visible canal hematoma. Disc levels: Mild multilevel anterior endplate spurring throughout the cervical spine with relatively preserved disc heights. Upper chest: Please see separate CT chest report from same day. Other: Endotracheal and enteric tubes noted extending below the field of view. IMPRESSION: 1. No acute intracranial abnormality. 2. No acute cervical spine fracture or traumatic listhesis. Electronically Signed   By: Titus Dubin M.D.   On: 12/01/2020 11:28   CT Angio Chest PE W/Cm &/Or Wo Cm  Result Date: 12/01/2020 CLINICAL DATA:  PE suspected.  High probability.  VFib arrest. EXAM: CT ANGIOGRAPHY CHEST WITH CONTRAST TECHNIQUE: Multidetector CT imaging of the chest was performed using the standard protocol during bolus administration of intravenous contrast. Multiplanar CT image reconstructions and MIPs were obtained to evaluate the vascular anatomy. CONTRAST:  4m OMNIPAQUE IOHEXOL 350 MG/ML SOLN COMPARISON:  None. FINDINGS: Cardiovascular: Satisfactory opacification of the pulmonary arteries to the segmental level. No evidence of pulmonary embolism.  Normal heart size. No pericardial effusion. Mediastinum/Nodes: The ET tube tip terminates in the proximal right mainstem bronchus. Recommend withdrawing by at least 2 cm. NG tube tip is well below the level of the GE junction and appears looped in the proximal stomach. Enlarged and heterogeneous thyroid gland noted. No axillary, supraclavicular, mediastinal, or hilar adenopathy. Lungs/Pleura: There is complete airspace consolidation of the left lower lobe and partial airspace consolidation involving the posterior and medial right lower lobe. Findings are likely secondary to aspiration although pneumonia is not excluded. Scattered linear areas of subsegmental atelectasis noted bilaterally. Upper Abdomen: No acute abnormality. Musculoskeletal: No chest wall abnormality. No acute or significant osseous findings. Review of the MIP images confirms the above findings. IMPRESSION: 1. No evidence for acute pulmonary embolus. 2. ET tube tip terminates in the proximal right mainstem bronchus. Recommend withdrawing by at least 2 cm. 3. Bilateral lower lobe airspace consolidation, left greater than right. Findings are likely secondary to aspiration although pneumonia is not excluded. 4. Enlarged and heterogeneous thyroid gland. Recommend thyroid ultrasound (ref: J Am Coll Radiol. 2015 Feb;12(2): 143-50). Electronically Signed   By: TKerby MoorsM.D.   On: 12/01/2020 11:34   CT  Cervical Spine Wo Contrast  Result Date: 12/01/2020 CLINICAL DATA:  Fall. EXAM: CT HEAD WITHOUT CONTRAST CT CERVICAL SPINE WITHOUT CONTRAST TECHNIQUE: Multidetector CT imaging of the head and cervical spine was performed following the standard protocol without intravenous contrast. Multiplanar CT image reconstructions of the cervical spine were also generated. COMPARISON:  None. FINDINGS: CT HEAD FINDINGS Brain: No evidence of acute infarction, hemorrhage, hydrocephalus, extra-axial collection or mass lesion/mass effect. Vascular: No hyperdense  vessel or unexpected calcification. Skull: Normal. Negative for fracture or focal lesion. Sinuses/Orbits: Secretions in the nasal cavity and nasopharynx. Clear sinuses and mastoid air cells. The orbits are unremarkable. Other: None. CT CERVICAL SPINE FINDINGS Alignment: Straightening and slight reversal of the normal cervical lordosis. No listhesis. Skull base and vertebrae: No acute fracture. No primary bone lesion or focal pathologic process. Soft tissues and spinal canal: No prevertebral fluid or swelling. No visible canal hematoma. Disc levels: Mild multilevel anterior endplate spurring throughout the cervical spine with relatively preserved disc heights. Upper chest: Please see separate CT chest report from same day. Other: Endotracheal and enteric tubes noted extending below the field of view. IMPRESSION: 1. No acute intracranial abnormality. 2. No acute cervical spine fracture or traumatic listhesis. Electronically Signed   By: Titus Dubin M.D.   On: 12/01/2020 11:28   DG Chest Port 1 View  Result Date: 12/02/2020 CLINICAL DATA:  History of cardiac arrest EXAM: PORTABLE CHEST 1 VIEW COMPARISON:  Chest x-ray dated August 18th 2022 FINDINGS: Stable position of ET tube and enteric tube. Cardiac and mediastinal contours are unchanged. Bibasilar opacities, likely a combination of atelectasis and pleural fluid. No evidence of pneumothorax. IMPRESSION: Increased right basilar opacity is, likely a combination of atelectasis and pleural fluid. Electronically Signed   By: Yetta Glassman M.D.   On: 12/02/2020 08:54   DG CHEST PORT 1 VIEW  Result Date: 12/01/2020 CLINICAL DATA:  Endotracheal tube. EXAM: PORTABLE CHEST 1 VIEW COMPARISON:  Same day. FINDINGS: Stable cardiomediastinal silhouette. Endotracheal and nasogastric tubes are unchanged in position. Lungs are clear. No pneumothorax or pleural effusion is noted. Bony thorax is unremarkable. IMPRESSION: Stable support apparatus.  No definite acute  abnormality is noted. Electronically Signed   By: Marijo Conception M.D.   On: 12/01/2020 13:15   DG Chest Portable 1 View  Result Date: 12/01/2020 CLINICAL DATA:  Retraction of ET tube EXAM: PORTABLE CHEST 1 VIEW COMPARISON:  Chest radiograph obtained earlier the same day FINDINGS: The endotracheal tube has been retracted; the tip now terminates approximately 2.7 cm from the carina. The enteric catheter tip is off the field of view. A defibrillator pad again overlies the chest. The cardiomediastinal silhouette is stable. There are hazy opacities in the lingula, unchanged. There is no new focal airspace opacity. There is no pleural effusion. There is no appreciable pneumothorax. The bones are stable. IMPRESSION: Interval retraction of the endotracheal tube with the tip now in satisfactory position. Electronically Signed   By: Valetta Mole M.D.   On: 12/01/2020 09:51   DG Chest Portable 1 View  Result Date: 12/01/2020 CLINICAL DATA:  Intubation EXAM: PORTABLE CHEST 1 VIEW COMPARISON:  04/25/2016 FINDINGS: ETT terminates within the right mainstem bronchus. Enteric tube courses below the diaphragm with distal tip beyond the inferior margin of the film. Heart size within normal limits. Patchy left basilar opacity. Mild volume loss within the left hemithorax. Right lung is clear. No pleural effusion or pneumothorax. IMPRESSION: 1. ETT terminates within the right mainstem bronchus. A subsequent image  in PACs was obtained following ET tube retraction. 2. Patchy left basilar opacity likely atelectasis. Electronically Signed   By: Davina Poke D.O.   On: 12/01/2020 09:50   EEG adult  Result Date: 12/02/2020 Lora Havens, MD     12/02/2020  1:48 PM Patient Name: Courtney Davies MRN: [id number] Epilepsy Attending: Lora Havens Referring Physician/Provider: Dr Ina Homes Date: 12/02/2020 Duration: 21.26 mins Patient history: 45yo F with ams. EEG to evaluate for seizure. Level of alertness:  comatose AEDs  during EEG study: Propofol Technical aspects: This EEG study was done with scalp electrodes positioned according to the 10-20 International system of electrode placement. Electrical activity was acquired at a sampling rate of 500Hz  and reviewed with a high frequency filter of 70Hz  and a low frequency filter of 1Hz . EEG data were recorded continuously and digitally stored. Description: EEG showed continuous generalized polymorphic sharply contoured 5 to 6 Hz theta slowing. Hyperventilation and photic stimulation were not performed.   ABNORMALITY - Continuous slow, generalized IMPRESSION: This study is suggestive of moderate diffuse encephalopathy, nonspecific etiology but could be related to sedation. No seizures or epileptiform discharges were seen throughout the recording. Lora Havens   ECHOCARDIOGRAM COMPLETE  Result Date: 12/01/2020    ECHOCARDIOGRAM REPORT   Patient Name:   Courtney Davies Date of Exam: 12/01/2020 Medical Rec #:  [id number]       Height:       66.0 in Accession #:    [id number]      Weight:       220.5 lb Date of Birth:  [birth year]       BSA:          2.084 m Patient Age:    72 years        BP:           107/77 mmHg Patient Gender: F               HR:           100 bpm. Exam Location:  Inpatient Procedure: 2D Echo, Cardiac Doppler and Color Doppler Indications:    Cardiac arrest  History:        Patient has no prior history of Echocardiogram examinations.  Sonographer:    Luisa Hart RDCS Referring Phys: [id number] GRACE E BOWSER  Sonographer Comments: Echo performed with patient supine and on artificial respirator. Image acquisition challenging due to patient body habitus. IMPRESSIONS  1. Left ventricular ejection fraction, by estimation, is 40 to 45%. The left ventricle has mildly decreased function. The left ventricle demonstrates global hypokinesis. There is mild left ventricular hypertrophy. Left ventricular diastolic parameters are consistent with Grade I diastolic dysfunction  (impaired relaxation).  2. Right ventricular systolic function is normal. The right ventricular size is normal. There is normal pulmonary artery systolic pressure.  3. The mitral valve is normal in structure. Mild to moderate mitral valve regurgitation. No evidence of mitral stenosis.  4. The aortic valve was not well visualized. Aortic valve regurgitation is not visualized. Mild aortic valve sclerosis is present, with no evidence of aortic valve stenosis. FINDINGS  Left Ventricle: Left ventricular ejection fraction, by estimation, is 40 to 45%. The left ventricle has mildly decreased function. The left ventricle demonstrates global hypokinesis. The left ventricular internal cavity size was normal in size. There is  mild left ventricular hypertrophy. Left ventricular diastolic parameters are consistent with Grade I diastolic dysfunction (impaired relaxation). Right Ventricle: The right ventricular size is normal.  No increase in right ventricular wall thickness. Right ventricular systolic function is normal. There is normal pulmonary artery systolic pressure. The tricuspid regurgitant velocity is 2.16 m/s, and  with an assumed right atrial pressure of 3 mmHg, the estimated right ventricular systolic pressure is 11.9 mmHg. Left Atrium: Left atrial size was normal in size. Right Atrium: Right atrial size was normal in size. Pericardium: There is no evidence of pericardial effusion. Mitral Valve: The mitral valve is normal in structure. Mild to moderate mitral valve regurgitation. No evidence of mitral valve stenosis. MV peak gradient, 3.8 mmHg. The mean mitral valve gradient is 2.0 mmHg. Tricuspid Valve: The tricuspid valve is normal in structure. Tricuspid valve regurgitation is trivial. Aortic Valve: The aortic valve was not well visualized. Aortic valve regurgitation is not visualized. Mild aortic valve sclerosis is present, with no evidence of aortic valve stenosis. Aortic valve mean gradient measures 3.0 mmHg.  Aortic valve peak gradient measures 6.1 mmHg. Aortic valve area, by VTI measures 2.34 cm. Pulmonic Valve: The pulmonic valve was not well visualized. Pulmonic valve regurgitation is not visualized. Aorta: The aortic root is normal in size and structure. IAS/Shunts: The interatrial septum was not well visualized.  LEFT VENTRICLE PLAX 2D LVIDd:         4.40 cm     Diastology LVIDs:         4.80 cm     LV e' medial:    6.74 cm/s LV PW:         1.50 cm     LV E/e' medial:  10.8 LV IVS:        1.10 cm     LV e' lateral:   6.64 cm/s LVOT diam:     2.00 cm     LV E/e' lateral: 11.0 LV SV:         39 LV SV Index:   19 LVOT Area:     3.14 cm  LV Volumes (MOD) LV vol d, MOD A2C: 74.3 ml LV vol d, MOD A4C: 67.3 ml LV vol s, MOD A2C: 36.8 ml LV vol s, MOD A4C: 49.0 ml LV SV MOD A2C:     37.5 ml LV SV MOD A4C:     67.3 ml LV SV MOD BP:      29.1 ml RIGHT VENTRICLE RV Basal diam:  3.00 cm RV Mid diam:    1.70 cm RV S prime:     15.50 cm/s TAPSE (M-mode): 1.9 cm LEFT ATRIUM           Index       RIGHT ATRIUM           Index LA diam:      3.20 cm 1.54 cm/m  RA Area:     11.80 cm LA Vol (A2C): 35.4 ml 16.98 ml/m RA Volume:   26.90 ml  12.91 ml/m LA Vol (A4C): 25.7 ml 12.33 ml/m  AORTIC VALVE                   PULMONIC VALVE AV Area (Vmax):    2.07 cm    PV Vmax:       0.94 m/s AV Area (Vmean):   1.91 cm    PV Vmean:      67.800 cm/s AV Area (VTI):     2.34 cm    PV VTI:        0.183 m AV Vmax:           123.00 cm/s  PV Peak grad:  3.6 mmHg AV Vmean:          84.200 cm/s PV Mean grad:  2.0 mmHg AV VTI:            0.168 m AV Peak Grad:      6.1 mmHg AV Mean Grad:      3.0 mmHg LVOT Vmax:         80.90 cm/s LVOT Vmean:        51.100 cm/s LVOT VTI:          0.125 m LVOT/AV VTI ratio: 0.74  AORTA Ao Root diam: 2.90 cm MITRAL VALVE                 TRICUSPID VALVE MV Area (PHT): 2.09 cm      TR Peak grad:   18.7 mmHg MV Area VTI:   1.68 cm      TR Vmax:        216.00 cm/s MV Peak grad:  3.8 mmHg MV Mean grad:  2.0 mmHg       SHUNTS MV Vmax:       0.98 m/s      Systemic VTI:  0.12 m MV Vmean:      71.5 cm/s     Systemic Diam: 2.00 cm MV Decel Time: 363 msec MR Peak grad:    88.7 mmHg MR Mean grad:    49.0 mmHg MR Vmax:         471.00 cm/s MR Vmean:        320.0 cm/s MR PISA:         0.57 cm MR PISA Eff ROA: 3 mm MR PISA Radius:  0.30 cm MV E velocity: 73.00 cm/s MV A velocity: 88.30 cm/s MV E/A ratio:  0.83 Oswaldo Milian MD Electronically signed by Oswaldo Milian MD Signature Date/Time: 12/01/2020/5:16:45 PM    Final     @[email or web address] @   Assessment and Plan:   Cardiac arrest Respiratory failure COVID positive - reported witnessed VF arrest by EMS. CPR initiated, epi x5, shock x5 and 458m amiodarone with achievement of ROSC. Intubated in the field. EKG with ST and no acute ST changes. HS trop 65. BNP 37, WBC 15.8. Started on IV abx and admitted - HS trop peaked at 5,000 and is now down-trending. continue IV heparin - LA 2.9>>IVF - Mag 1.6, goal>2. Keep K>4 - CTA chest with no PE and possible aspiration PNA>>on IV abx - echo ordered - plan to wean of sedation to evaluate mental status. EEG pending - No known CAD history, RF include DM2 and HTN. Risk stratify with lipid panel - Given elevated troponin will likely need cardiac cath, how defer at this time given current mental status.  CM  Mild to mod MR - Prior echo in 2015 showed normal LVEF, mild MR, dilated LA.  - echo this admission showed LVEF 40-45%, G1DD, normal RV function, mild to mod MR - Will likely need cath pending mental status   HTN - PTA no meds - when patient is PO can start BB  Elevated LFTs - AST/ALT trending down  AKI - Scr 0.9>>1.23 - daily BMET  Diabetes Type 2 - A1C 6.9 - per primary team  For questions or updates, please contact [facility] Please consult [email or web address] for contact info under     Signed, Cadence HNinfa Meeker PA-C  12/02/2020 2:12 PM    I have examined the patient and reviewed  assessment and plan and discussed with patient.  Agree with above as stated.   Patient with cardiac arrest.  Amio started for AFib earlier today.  Back in NSR.    Echo showed EF 40-45%, global hypokinesis.   Unclear whether EF was low and caused cardiac arrest, or if low EF came from transient hypoperfusion due to cardiac arrest.  Most notable issue now after extubation and more than 3 hours post sedation is her neuro status.   Currently not following commands.  There is also a concern for aspiration.  From a cardiac standpoint, cardiac cath would be indicated to evaluate coronary anatomy and evaluate for ischemia as a cause of her cardiac arrest.  ECG does not show acute MI.  Troponin peaked at a relatively low level and is now downtrending.  If cath did not show obstructive disease, cardiac MRI would be considered.  However, this is all dependent upon her neurologic recovery.  Recommendations: Cardiac cath to be performed based on her neurologic recovery. Since she is back in normal sinus rhythm, will transition from IV amiodarone to tablets down her core track.  Currently on IV heparin.  Continue supportive care.  Larae Grooms

## 2020-12-02 NOTE — Progress Notes (Signed)
On further clarification by family, apparently the COVID test 3 weeks ago was actually negative so this is an acute COVID infection.  Will hold off on targeted therapy other than steroids given that she has shock liver and is already intubated without signs of significant COVID-associated lung disease.  Erskine Emery MD PCCM

## 2020-12-02 NOTE — Procedures (Signed)
Intubation Procedure Note  Courtney Davies  [id number]  [birth year]  Date:12/02/20  Time:5:11 PM   Provider Performing:Courtney Davies Courtney Davies    Procedure: Intubation (00712)  Indication(s) Respiratory Failure  Consent Risks of the procedure as well as the alternatives and risks of each were explained to the patient and/or caregiver.  Consent for the procedure was obtained and is signed in the bedside chart   Anesthesia Etomidate, Versed, Fentanyl, and Rocuronium   Time Out Verified patient identification, verified procedure, site/side was marked, verified correct patient position, special equipment/implants available, medications/allergies/relevant history reviewed, required imaging and test results available.   Sterile Technique Usual hand hygeine, masks, and gloves were used   Procedure Description Patient positioned in bed supine.  Sedation given as noted above.  Patient was intubated with endotracheal tube using Glidescope.  View was Grade 1 full glottis .  Number of attempts was 1.  Colorimetric CO2 detector was consistent with tracheal placement.   Complications/Tolerance None; patient tolerated the procedure well. Chest X-ray is ordered to verify placement.   EBL Minimal   Specimen(s) None

## 2020-12-02 NOTE — Procedures (Signed)
Patient Name: Courtney Davies  MRN: [id number]  Epilepsy Attending: Lora Havens  Referring Physician/Provider: Dr Ina Homes Date: 12/02/2020 Duration: 21.26 mins  Patient history: 45yo F with ams. EEG to evaluate for seizure.  Level of alertness:  comatose  AEDs during EEG study: Propofol  Technical aspects: This EEG study was done with scalp electrodes positioned according to the 10-20 International system of electrode placement. Electrical activity was acquired at a sampling rate of 500Hz  and reviewed with a high frequency filter of 70Hz  and a low frequency filter of 1Hz . EEG data were recorded continuously and digitally stored.   Description: EEG showed continuous generalized polymorphic sharply contoured 5 to 6 Hz theta slowing. Hyperventilation and photic stimulation were not performed.     ABNORMALITY - Continuous slow, generalized  IMPRESSION: This study is suggestive of moderate diffuse encephalopathy, nonspecific etiology but could be related to sedation. No seizures or epileptiform discharges were seen throughout the recording.  Madden Piazza Barbra Sarks

## 2020-12-02 NOTE — Progress Notes (Signed)
EEG complete - results pending 

## 2020-12-02 NOTE — Progress Notes (Signed)
   NAME:  Courtney Davies, MRN:  [id number], DOB:  1975-07-16, LOS: 1 ADMISSION DATE:  12/01/2020, CONSULTATION DATE:  12/01/20 REFERRING MD:  Karle Starch - EM, CHIEF COMPLAINT:  Vfib arrest   History of Present Illness:  45 yo F PMH HTN DM2 B12 def GERD OA presented to [facility] 8/18 as OOH Vfib ~10 min arrest.  COVID +, no STEMI.  CTA neg for PE.    Pertinent  Medical History  B12 deficiency DM GERD Dysphagia  Bronchitis  OA  L knee pain  Significant Hospital Events: Including procedures, antibiotic start and stop dates in addition to other pertinent events   8/18 ED via EMS as Vfib arrest with ROSC, 5 cycles Epi 5x DF and 451m amio. ECG without acute ST segment changes. COVID positive   Interim History / Subjective:  No events. Extremely sedated.  Objective   Blood pressure 107/79, pulse (!) 105, temperature 99.1 F (37.3 C), temperature source Bladder, resp. rate 18, height 5' 6"  (1.676 m), weight 101.6 kg, SpO2 97 %.    Vent Mode: PRVC FiO2 (%):  [40 %-100 %] 40 % Set Rate:  [18 bmp] 18 bmp Vt Set:  [480 mL-550 mL] 480 mL PEEP:  [5 cmH20-6 cmH20] 5 cmH20 Plateau Pressure:  [19 cmH20-26 cmH20] 19 cmH20   Intake/Output Summary (Last 24 hours) at 12/02/2020 0800 Last data filed at 12/02/2020 0600 Gross per 24 hour  Intake 2965.26 ml  Output 4025 ml  Net -1059.74 ml    Filed Weights   12/01/20 0900 12/02/20 0500  Weight: 100 kg 101.6 kg    Examination: RASS -5, pupils pinpoint, lungs clear Heart sounds irregulars (PACs on monitor), ext warm No edema, not triggering vent  K/Mg being replaced Cr stable Shock liver improving CBC improved  Echo global hypokinesis +trop leak  Resolved Hospital Problem list     Assessment & Plan:  OOH Vfib Arrest- COVID +, trop leak, nonischemic EKG, question COVID-associated arrhythmia vs. Myocarditis vs. ACS; odd thing here is that she was COVID positive 3 weeks ago, her CRP is not really elevated. Hx of asthma- was wheezing  yesterday apparently on ROSC but collapsed at work without preceding respiratory symptoms Shock liver improving Mild AKI  stable Encephalopathy- need to wean sedation to see where we stand - Heparin drip - Trend trop - Avoid fevers - Unasyn to ceftriaxone - Short course of steroids - Wean sedation, SAT/SBT if wakes up - Probably needs LHC prior to DC, will bring cardiology on board to help sort out etiology of this arrest; seems like she had no respiratory symptoms preceding  Best Practice (right click and "Reselect all SmartList Selections" daily)   Diet/type: NPO, TF if does not wake up DVT prophylaxis: heparin gtt GI prophylaxis: PPI Lines: N/A Foley:  Yes, and it is still needed Code Status:  full code Last date of multidisciplinary goals of care discussion [will reach out]  41 minutes critical care time

## 2020-12-02 NOTE — Procedures (Signed)
Cortrak  Person Inserting Tube:  Maylon Peppers C, RD Tube Type:  Cortrak - 43 inches Tube Size:  10 Tube Location:  Left nare Initial Placement:  Stomach Secured by: Bridle Technique Used to Measure Tube Placement:  Marking at nare/corner of mouth Cortrak Secured At:  65 cm  Cortrak Tube Team Note:  Consult received to place a Cortrak feeding tube.   X-ray is required, abdominal x-ray has been ordered by the Cortrak team. Please confirm tube placement before using the Cortrak tube.   If the tube becomes dislodged please keep the tube and contact the Cortrak team at [email or web address] ([email or web address]) for replacement.  If after hours and replacement cannot be delayed, place a NG tube and confirm placement with an abdominal x-ray.    Lockie Pares., RD, LDN, CNSC See AMiON for contact information

## 2020-12-02 NOTE — Progress Notes (Signed)
ANTICOAGULATION CONSULT NOTE   Pharmacy Consult for Heparin Indication: chest pain/ACS  Allergies  Allergen Reactions   Other Anaphylaxis and Hives    Hair Glue   Codeine Nausea And Vomiting   Latex Hives   Nitrofurantoin Rash    Patient Measurements: Height: 5' 6"  (167.6 cm) Weight: 101.6 kg (223 lb 15.8 oz) IBW/kg (Calculated) : 59.3 Heparin weight 85kg   Vital Signs: Temp: 98.1 F (36.7 C) (08/19 1830) Temp Source: Rectal (08/19 1830) BP: 104/72 (08/19 1830) Pulse Rate: 82 (08/19 1830)  Labs: Recent Labs    12/01/20 0913 12/01/20 0918 12/01/20 0940 12/01/20 1256 12/01/20 1706 12/02/20 0450 12/02/20 0834 12/02/20 1757  HGB 12.7 14.3 14.3 14.6  --  12.8  --   --   HCT 42.7 42.0 42.0 43.0  --  40.7  --   --   PLT 237  --   --   --   --  210  --   --   HEPARINUNFRC  --   --   --   --   --   --   --  0.37  CREATININE 1.21* 0.90  --   --   --  1.23*  --   --   TROPONINIHS 65*  --   --   --  5,186*  --  3,074*  --      Estimated Creatinine Clearance: 70.2 mL/min (A) (by C-G formula based on SCr of 1.23 mg/dL (H)).     Assessment: 45yof admitted after VF arrest.  Troponin  elevated will be evaluated for ischemia. CBC stable Heparin sq given early this am for VTE prophylaxis.  Will give small bolus and begin infusion.   PM f/u > heparin level within therapeutic range.  No overt bleeding or complications noted.  Goal of Therapy:  Heparin level 0.3-0.7 units/ml Monitor platelets by anticoagulation protocol: Yes   Plan:  Continue IV heparin at current rate of 1400 units/hr. Recheck heparin level in 6 hrs. Daily heparin level and CBC.  [place] Crane, Roylene Reason, BCCP Clinical Pharmacist  12/02/2020 7:13 PM   [facility] pharmacy phone numbers are listed on [email or web address]

## 2020-12-02 NOTE — Progress Notes (Signed)
Initial Nutrition Assessment  DOCUMENTATION CODES:   Not applicable  INTERVENTION:   Tube Feeding via Cortrak:  Vital AF 1.2 at 65 ml/hr Provides 117 g of protein, 1872 kcals and 1264 mL of free water   NUTRITION DIAGNOSIS:   Inadequate oral intake related to acute illness as evidenced by NPO status.  GOAL:   Patient will meet greater than or equal to 90% of their needs  MONITOR:   Vent status, PO intake, Supplement acceptance, Labs, Weight trends  REASON FOR ASSESSMENT:   Ventilator    ASSESSMENT:   45 yo female admitted post Vfib arrest, COVID+. PMH includes DM, GERD, dysphagia, B12 def  8/18 Admitted, Intubated 8/19 Extubated  NPO post extubation, plan for Cortrak placement  Unable to obtain diet and weight history from patient at this time  Current wt 101.6 kg; no previous weight encounters  Labs: potassium 3.4 (L),CBGs 128-155 Meds: solumedrol   NUTRITION - FOCUSED PHYSICAL EXAM:  Flowsheet Row Most Recent Value  Orbital Region No depletion  Upper Arm Region No depletion  Thoracic and Lumbar Region No depletion  Buccal Region No depletion  Temple Region No depletion  Clavicle Bone Region No depletion  Clavicle and Acromion Bone Region No depletion  Scapular Bone Region Unable to assess  Dorsal Hand Unable to assess  Patellar Region No depletion  Anterior Thigh Region No depletion  Posterior Calf Region No depletion  Edema (RD Assessment) None  Hair Reviewed  Eyes Reviewed  Mouth Reviewed  Skin Reviewed  Nails Unable to assess       Diet Order:   Diet Order             Diet NPO time specified  Diet effective now                   EDUCATION NEEDS:   Not appropriate for education at this time  Skin:  Skin Assessment: Reviewed RN Assessment  Last BM:  no documented BM  Height:   Ht Readings from Last 1 Encounters:  12/01/20 5' 6"  (1.676 m)    Weight:   Wt Readings from Last 1 Encounters:  12/02/20 101.6 kg      BMI:  Body mass index is 36.15 kg/m.  Estimated Nutritional Needs:   Kcal:  1800-1200 kcals  Protein:  100-120 g  Fluid:  >/= 1.8 L   Kerman Passey MS, RDN, LDN, CNSC Registered Dietitian III Clinical Nutrition RD Pager and On-Call Pager Number Located in [place]

## 2020-12-02 NOTE — Procedures (Signed)
Extubation Procedure Note  Patient Details:   Name: AVELYN TOUCH DOB: 10/07/1975 MRN: [id number]   Airway Documentation:    Vent end date: 12/02/20 Vent end time: 1202   Evaluation  O2 sats: stable throughout Complications: No apparent complications Patient did tolerate procedure well. Bilateral Breath Sounds: Clear, Diminished   Yes  Patient was extubated to a 4L [place] without any dyspnea or stridor noted. Positive cuff leak prior to extubation.   Claretta Fraise 12/02/2020, 12:02 PM

## 2020-12-02 NOTE — Procedures (Signed)
Central Venous Catheter Insertion Procedure Note  Courtney Davies  [id number]  20-Sep-1975  Date:12/02/20  Time:5:12 PM   Provider Performing:Slaton Reaser [name]   Procedure: Insertion of Non-tunneled Central Venous [phone]) with US guidance (40973)   Indication(s) Medication administration and Difficult access  Consent Risks of the procedure as well as the alternatives and risks of each were explained to the patient and/or caregiver.  Consent for the procedure was obtained and is signed in the bedside chart  Anesthesia Topical only with 1% lidocaine   Timeout Verified patient identification, verified procedure, site/side was marked, verified correct patient position, special equipment/implants available, medications/allergies/relevant history reviewed, required imaging and test results available.  Sterile Technique Maximal sterile technique including full sterile barrier drape, hand hygiene, sterile gown, sterile gloves, mask, hair covering, sterile ultrasound probe cover (if used).  Procedure Description Area of catheter insertion was cleaned with chlorhexidine and draped in sterile fashion.  With real-time ultrasound guidance a central venous catheter was placed into the left internal jugular vein. Nonpulsatile blood flow and easy flushing noted in all ports.  The catheter was sutured in place and sterile dressing applied.  Complications/Tolerance None; patient tolerated the procedure well. Chest X-ray is ordered to verify placement for internal jugular or subclavian cannulation.   Chest x-ray is not ordered for femoral cannulation.  EBL Minimal  Specimen(s) None

## 2020-12-02 NOTE — Procedures (Signed)
Arterial Catheter Insertion Procedure Note  Courtney Davies  [id number]  Sep 08, 1975  Date:12/02/20  Time:5:50 PM    Provider Performing: Candee Furbish    Procedure: Insertion of Arterial Line [phone]) with US guidance (02585)   Indication(s) Blood pressure monitoring and/or need for frequent ABGs  Consent Risks of the procedure as well as the alternatives and risks of each were explained to the patient and/or caregiver.  Consent for the procedure was obtained and is signed in the bedside chart  Anesthesia None   Time Out Verified patient identification, verified procedure, site/side was marked, verified correct patient position, special equipment/implants available, medications/allergies/relevant history reviewed, required imaging and test results available.   Sterile Technique Maximal sterile technique including full sterile barrier drape, hand hygiene, sterile gown, sterile gloves, mask, hair covering, sterile ultrasound probe cover (if used).   Procedure Description Area of catheter insertion was cleaned with chlorhexidine and draped in sterile fashion. Without real-time ultrasound guidance an arterial catheter was placed into the left radial artery.  Appropriate arterial tracings confirmed on monitor.     Complications/Tolerance None; patient tolerated the procedure well.   EBL Minimal   Specimen(s) None

## 2020-12-02 NOTE — Progress Notes (Signed)
ANTICOAGULATION CONSULT NOTE - Initial Consult  Pharmacy Consult for Heparin Indication: chest pain/ACS  Allergies  Allergen Reactions   Other Anaphylaxis and Hives    Hair Glue   Codeine Nausea And Vomiting   Latex Hives   Nitrofurantoin Rash    Patient Measurements: Height: 5' 6"  (167.6 cm) Weight: 101.6 kg (223 lb 15.8 oz) IBW/kg (Calculated) : 59.3 Heparin weight 85kg   Vital Signs: BP: 146/98 (08/19 1245) Pulse Rate: 99 (08/19 1245)  Labs: Recent Labs    12/01/20 0913 12/01/20 0918 12/01/20 0940 12/01/20 1256 12/01/20 1706 12/02/20 0450 12/02/20 0834  HGB 12.7 14.3 14.3 14.6  --  12.8  --   HCT 42.7 42.0 42.0 43.0  --  40.7  --   PLT 237  --   --   --   --  210  --   CREATININE 1.21* 0.90  --   --   --  1.23*  --   TROPONINIHS 65*  --   --   --  5,186*  --  3,074*    Estimated Creatinine Clearance: 70.2 mL/min (A) (by C-G formula based on SCr of 1.23 mg/dL (H)).     Assessment: 45yof admitted after VF arrest.  Troponin  elevated will be evaluated for ischemia. CBC stable Heparin sq given early this am for VTE prophylaxis.  Will give small bolus and begin infusion.    Goal of Therapy:  Heparin level 0.3-0.7 units/ml Monitor platelets by anticoagulation protocol: Yes   Plan:  Heparin bolus 3000 utsIV x1 then 1400 uts/hr  Draw heparin level 6hr after starting drip  Daily heparin level and CBC   Bonnita Nasuti Pharm.D. CPP, BCPS Clinical Pharmacist [phone] 12/02/2020 1:28 PM

## 2020-12-03 ENCOUNTER — Inpatient Hospital Stay (HOSPITAL_COMMUNITY): Payer: Medicaid Other

## 2020-12-03 DIAGNOSIS — I469 Cardiac arrest, cause unspecified: Secondary | ICD-10-CM | POA: Diagnosis not present

## 2020-12-03 LAB — BASIC METABOLIC PANEL
Anion gap: 7 (ref 5–15)
BUN: 19 mg/dL (ref 6–20)
CO2: 19 mmol/L — ABNORMAL LOW (ref 22–32)
Calcium: 8.3 mg/dL — ABNORMAL LOW (ref 8.9–10.3)
Chloride: 112 mmol/L — ABNORMAL HIGH (ref 98–111)
Creatinine, Ser: 1.4 mg/dL — ABNORMAL HIGH (ref 0.44–1.00)
GFR, Estimated: 48 mL/min — ABNORMAL LOW (ref >60)
Glucose, Bld: 134 mg/dL — ABNORMAL HIGH (ref 70–99)
Potassium: 3.5 mmol/L (ref 3.5–5.1)
Sodium: 138 mmol/L (ref 135–145)

## 2020-12-03 LAB — CBC
HCT: 36.5 % (ref 36.0–46.0)
HCT: 35.2 % — ABNORMAL LOW (ref 36.0–46.0)
Hemoglobin: 11.4 g/dL — ABNORMAL LOW (ref 12.0–15.0)
Hemoglobin: 10.7 g/dL — ABNORMAL LOW (ref 12.0–15.0)
MCH: 22.8 pg — ABNORMAL LOW (ref 26.0–34.0)
MCH: 22.5 pg — ABNORMAL LOW (ref 26.0–34.0)
MCHC: 31.2 g/dL (ref 30.0–36.0)
MCHC: 30.4 g/dL (ref 30.0–36.0)
MCV: 72.9 fL — ABNORMAL LOW (ref 80.0–100.0)
MCV: 73.9 fL — ABNORMAL LOW (ref 80.0–100.0)
Platelets: 212 10*3/uL (ref 150–400)
Platelets: 207 10*3/uL (ref 150–400)
RBC: 5.01 MIL/uL (ref 3.87–5.11)
RBC: 4.76 MIL/uL (ref 3.87–5.11)
RDW: 16.7 % — ABNORMAL HIGH (ref 11.5–15.5)
RDW: 16.7 % — ABNORMAL HIGH (ref 11.5–15.5)
WBC: 13.3 10*3/uL — ABNORMAL HIGH (ref 4.0–10.5)
WBC: 10.4 10*3/uL (ref 4.0–10.5)
nRBC: 0 % (ref 0.0–0.2)
nRBC: 0 % (ref 0.0–0.2)

## 2020-12-03 LAB — GLUCOSE, CAPILLARY
Glucose-Capillary: 124 mg/dL — ABNORMAL HIGH (ref 70–99)
Glucose-Capillary: 125 mg/dL — ABNORMAL HIGH (ref 70–99)
Glucose-Capillary: 128 mg/dL — ABNORMAL HIGH (ref 70–99)
Glucose-Capillary: 135 mg/dL — ABNORMAL HIGH (ref 70–99)
Glucose-Capillary: 149 mg/dL — ABNORMAL HIGH (ref 70–99)
Glucose-Capillary: 92 mg/dL (ref 70–99)

## 2020-12-03 LAB — TRIGLYCERIDES: Triglycerides: 142 mg/dL (ref <150)

## 2020-12-03 LAB — PROCALCITONIN: Procalcitonin: 1.52 ng/mL

## 2020-12-03 LAB — MAGNESIUM
Magnesium: 2.4 mg/dL (ref 1.7–2.4)
Magnesium: 2.2 mg/dL (ref 1.7–2.4)

## 2020-12-03 LAB — HEPARIN LEVEL (UNFRACTIONATED)
Heparin Unfractionated: 0.51 IU/mL (ref 0.30–0.70)
Heparin Unfractionated: 0.57 IU/mL (ref 0.30–0.70)

## 2020-12-03 LAB — PHOSPHORUS
Phosphorus: 4.4 mg/dL (ref 2.5–4.6)
Phosphorus: 3.4 mg/dL (ref 2.5–4.6)

## 2020-12-03 MED ORDER — NOREPINEPHRINE 4 MG/250ML-% IV SOLN
INTRAVENOUS | Status: AC
  Administered 2020-12-03: 4 mg
  Filled 2020-12-03: qty 250

## 2020-12-03 MED ORDER — AMIODARONE HCL 200 MG PO TABS
200.0000 mg | ORAL_TABLET | Freq: Every day | ORAL | Status: DC
  Administered 2020-12-06: 200 mg

## 2020-12-03 MED ORDER — SODIUM CHLORIDE 0.9% FLUSH
10.0000 mL | Freq: Two times a day (BID) | INTRAVENOUS | Status: DC
  Administered 2020-12-05 – 2020-12-06 (×3): 10 mL

## 2020-12-03 MED ORDER — LACTATED RINGERS IV SOLN: INTRAVENOUS | Status: DC

## 2020-12-03 MED ORDER — SODIUM CHLORIDE 0.9 % IV SOLN
2.0000 g | Freq: Three times a day (TID) | INTRAVENOUS | Status: DC
  Administered 2020-12-03 – 2020-12-04 (×2): 2 g via INTRAVENOUS
  Filled 2020-12-03 (×2): qty 2

## 2020-12-03 MED ORDER — POTASSIUM CHLORIDE 20 MEQ PO PACK
40.0000 meq | PACK | Freq: Once | ORAL | Status: AC
  Administered 2020-12-03: 40 meq
  Filled 2020-12-03: qty 2

## 2020-12-03 MED ORDER — AMIODARONE HCL 200 MG PO TABS
200.0000 mg | ORAL_TABLET | Freq: Two times a day (BID) | ORAL | Status: DC
  Administered 2020-12-03 – 2020-12-09 (×12): 200 mg
  Filled 2020-12-03 (×13): qty 1

## 2020-12-03 MED ORDER — NOREPINEPHRINE 4 MG/250ML-% IV SOLN
0.0000 ug/min | INTRAVENOUS | Status: DC
  Administered 2020-12-03: 5 ug/min via INTRAVENOUS
  Administered 2020-12-05: 2 ug/min via INTRAVENOUS
  Filled 2020-12-03: qty 250

## 2020-12-03 MED ORDER — SODIUM CHLORIDE 0.9% FLUSH
10.0000 mL | INTRAVENOUS | Status: DC | PRN
  Administered 2020-12-07: 10 mL

## 2020-12-03 MED ORDER — PANTOPRAZOLE SODIUM 40 MG PO PACK
40.0000 mg | PACK | Freq: Every day | ORAL | Status: DC
  Administered 2020-12-03 – 2020-12-05 (×3): 40 mg
  Filled 2020-12-03 (×4): qty 20

## 2020-12-03 NOTE — Progress Notes (Signed)
Sputum sample collected taken to the lab

## 2020-12-03 NOTE — Progress Notes (Signed)
Pharmacy Antibiotic Note  Courtney Davies is a 45 y.o. female with  developing sepsis .  Pharmacy has been consulted for Cefepime dosing.  WBC elevated at 13.3, SCr 1.4  Plan: -Start Cefepime 2 gm IV Q 8 hours -Monitor CBC, renal fx, cultures and clinical progress   Height: 5' 6"  (167.6 cm) Weight: 105.3 kg (232 lb 2.3 oz) (artic sun pads added since last weight) IBW/kg (Calculated) : 59.3  Temp (24hrs), Avg:98.4 F (36.9 C), Min:96.8 F (36 C), Max:99.3 F (37.4 C)  Recent Labs  Lab 12/01/20 0913 12/01/20 0918 12/01/20 1011 12/01/20 1706 12/02/20 0450 12/03/20 0516  WBC 15.8*  --   --   --  8.8 13.3*  CREATININE 1.21* 0.90  --   --  1.23* 1.40*  LATICACIDVEN  --   --  <0.3* 2.9*  --   --     Estimated Creatinine Clearance: 62.9 mL/min (A) (by C-G formula based on SCr of 1.4 mg/dL (H)).    Allergies  Allergen Reactions   Other Anaphylaxis and Hives    Hair Glue   Codeine Nausea And Vomiting   Latex Hives   Nitrofurantoin Rash    Antimicrobials this admission: Cefepime 8/18 >>   Dose adjustments this admission:  Microbiology results: 8/18 BCx:    Thank you for allowing pharmacy to be a part of this patient's care.  Albertina Parr, PharmD., BCPS, BCCCP Clinical Pharmacist Please refer to [facility] for unit-specific pharmacist

## 2020-12-03 NOTE — Progress Notes (Signed)
   NAME:  Courtney Davies, MRN:  [id number], DOB:  11-21-1975, LOS: 2 ADMISSION DATE:  12/01/2020, CONSULTATION DATE:  12/01/20 REFERRING MD:  Karle Starch - EM, CHIEF COMPLAINT:  Vfib arrest   History of Present Illness:  45 yo F PMH HTN DM2 B12 def GERD OA presented to [facility] 8/18 as OOH Vfib ~10 min arrest.  COVID +, no STEMI.  CTA neg for PE.    Pertinent  Medical History  B12 deficiency DM GERD Dysphagia  Bronchitis  OA  L knee pain  Significant Hospital Events: Including procedures, antibiotic start and stop dates in addition to other pertinent events   8/18 ED via EMS as Vfib arrest with ROSC, 5 cycles Epi 5x DF and 42m amio. ECG without acute ST segment changes. COVID positive  8/19 sedation hold and extubation, not following commands, started to spike fevers, not protecting airway so reintubated, CVL placed  Interim History / Subjective:  No events. Still not following commands. Temp now controlled with cooling pads.  Objective   Blood pressure 97/67, pulse 75, temperature 98.6 F (37 C), temperature source Rectal, resp. rate 18, height 5' 6"  (1.676 m), weight 105.3 kg, SpO2 99 %.    Vent Mode: PRVC FiO2 (%):  [40 %-50 %] 40 % Set Rate:  [18 bmp] 18 bmp Vt Set:  [480 mL] 480 mL PEEP:  [5 cmH20] 5 cmH20 Pressure Support:  [8 cmH20] 8 cmH20 Plateau Pressure:  [18 cmH20] 18 cmH20   Intake/Output Summary (Last 24 hours) at 12/03/2020 0853 Last data filed at 12/03/2020 0700 Gross per 24 hour  Intake 1793.65 ml  Output 1585 ml  Net 208.65 ml    Filed Weights   12/01/20 0900 12/02/20 0500 12/03/20 0418  Weight: 100 kg 101.6 kg 105.3 kg    Examination: No distress Occasional gagging on tube Opens eyes to voice but no tracking Lungs with rhonci, triggers vent Winces to pain and some flexion Pupils reactive, +corneals, +cough/gag  K replaced Cr up a bit Shock liver improving WBC up slightly, plts stable   Resolved Hospital Problem list   Shock  liver  Assessment & Plan:  OOH Vfib Arrest- COVID +, trop leak, nonischemic EKG, question COVID-associated arrhythmia vs. Myocarditis vs. ACS Resp failure- due to inability to handle secretions, recurrent and reintubated 8/19 COVID +: CT not impressive for COVID pneumonitis Hx of asthma- was wheezing yesterday apparently on ROSC but collapsed at work without preceding respiratory symptoms Mild AKI  a little worse today Encephalopathy- unfortunately may some significant anoxic injury here Afib- reactive, intermittent  - TTM 37 protocol - Vent support, VAP prevention bundle - Gentle LR, avoid nephrotoxins - Heparin/amio gtt for now, transition to PO amio once in sinus consistently - Steroids x 7 days, baricitinib (already on vent) and remdesivir (shock liver) unlikely to provide benefit here - C collar per institutional protocol (despite neg C spine CT) - Ceftriaxone x 7 days - Keep sedated through tomorrow and lighten again, if still not purposeful will get MRI and probably have neuro see Monday  Best Practice (right click and "Reselect all SmartList Selections" daily)   Diet/type: TF DVT prophylaxis: heparin gtt GI prophylaxis: PPI Lines: yes, needed Foley:  Yes, and it is still needed Code Status:  full code Last date of multidisciplinary goals of care discussion [father 8/19, plan to re-engage depending on how she wakes up 8/20]  35 minutes critical care time

## 2020-12-03 NOTE — Progress Notes (Signed)
Orange bite block was placed on ETT with the assistance of the RN. No complications noted.

## 2020-12-03 NOTE — Progress Notes (Addendum)
PCCM Interval Progress Note  Called to bedside by RN for hypotension with SBP in the 80s and fluctuating HR to 60s from previously in 90s. Given hemodynamic instability in setting of recent fevers, will start broad spectrum antibiotics and vasopressor support. Will need to monitor on telemetry for cardiac arrhythmia in setting of recent Vfib arrest.  6:10 PM Levophed ordered Cefepime ordered  6:50 PM Exam with decorticate posturing STAT CT head ordered Night team notified to follow  Rodman Pickle, M.D. [facility] Pulmonary/Critical Care Medicine

## 2020-12-03 NOTE — Progress Notes (Signed)
[place] for Heparin Indication: chest pain/ACS   Labs: Recent Labs    12/01/20 0913 12/01/20 0918 12/01/20 0940 12/01/20 1256 12/01/20 1706 12/02/20 0450 12/02/20 0834 12/02/20 1757 12/03/20 0123  HGB 12.7 14.3 14.3 14.6  --  12.8  --   --   --   HCT 42.7 42.0 42.0 43.0  --  40.7  --   --   --   PLT 237  --   --   --   --  210  --   --   --   HEPARINUNFRC  --   --   --   --   --   --   --  0.37 0.51  CREATININE 1.21* 0.90  --   --   --  1.23*  --   --   --   TROPONINIHS 65*  --   --   --  5,186*  --  3,074*  --   --     Assessment: 45yof admitted after VF arrest.  Troponin  elevated will be evaluated for ischemia. CBC stable Heparin sq given early this am for VTE prophylaxis.  Will give small bolus and begin infusion.   Heparin level this am 0.51  Goal of Therapy:  Heparin level 0.3-0.7 units/ml Monitor platelets by anticoagulation protocol: Yes   Plan:  Continue IV heparin at current rate of 1400 units/hr. Daily heparin level and CBC. Thanks for allowing pharmacy to be a part of this patient's care.  Excell Seltzer, PharmD Clinical Pharmacist   12/03/2020 3:37 AM

## 2020-12-03 NOTE — Progress Notes (Signed)
SLP Cancellation Note  Patient Details Name: MARZELLA MIRACLE MRN: [id number] DOB: 09/22/1975   Cancelled treatment:       Reason Eval/Treat Not Completed: Medical issues which prohibited therapy; pt reintubated; ST will continue efforts when pt medically able.   Elvina Sidle, M.S., [place] 12/03/2020, 9:48 AM

## 2020-12-03 NOTE — Progress Notes (Signed)
Pt transported from 2H09 to CT and back with no complications.

## 2020-12-03 NOTE — Progress Notes (Signed)
PT Cancellation Note  Patient Details Name: VARA MAIRENA MRN: [id number] DOB: [birth year]   Cancelled Treatment:    Reason Eval/Treat Not Completed: Patient not medically ready. Failed extubation yesterday. Currently intubated and sedated. PT to follow.    Lorriane Shire 12/03/2020, 9:33 AM  Lorrin Goodell, PT  Office # [phone] Pager [phone]

## 2020-12-03 NOTE — Progress Notes (Signed)
Spoke with sister at length. Plan will be sedation lightening tomorrow, if not waking up and following commands will proceed with MRI and neurology consultation.  Tentative plan for family meeting Monday to get everyone on same page about timeline of prognostication, expectations, and test results.  Apparently father at bedside has not been relaying all information to remainder of family who is not allowed to visit.   RN to ask [facility] leadership for visitation exception for Monday to facilitate this meeting.  Erskine Emery MD PCCM

## 2020-12-03 NOTE — Progress Notes (Signed)
ANTICOAGULATION CONSULT NOTE   Pharmacy Consult for Heparin Indication: chest pain/ACS   Labs: Recent Labs    12/01/20 0913 12/01/20 0918 12/01/20 0940 12/01/20 1256 12/01/20 1706 12/02/20 0450 12/02/20 0834 12/02/20 1757 12/03/20 0123 12/03/20 0516  HGB 12.7 14.3   < > 14.6  --  12.8  --   --   --  11.4*  HCT 42.7 42.0   < > 43.0  --  40.7  --   --   --  36.5  PLT 237  --   --   --   --  210  --   --   --  212  HEPARINUNFRC  --   --   --   --   --   --   --  0.37 0.51 0.57  CREATININE 1.21* 0.90  --   --   --  1.23*  --   --   --  1.40*  TROPONINIHS 65*  --   --   --  5,186*  --  3,074*  --   --   --    < > = values in this interval not displayed.    Assessment: 45yof admitted after VF arrest.  Troponin  elevated will be evaluated for ischemia.  Heparin level continues to be at goal (0.5) on 1400 units/hr. Hgb trending down from 14 on admit to 11.4. No bleeding noted, will follow closely.   Goal of Therapy:  Heparin level 0.3-0.7 units/ml Monitor platelets by anticoagulation protocol: Yes   Plan:  Continue IV heparin at current rate of 1400 units/hr. Daily heparin level and CBC.  Thanks for allowing pharmacy to be a part of this patient's care.  Erin Hearing PharmD., BCPS Clinical Pharmacist 12/03/2020 10:30 AM

## 2020-12-03 NOTE — Progress Notes (Signed)
   Progress Note  Patient Name: Courtney Davies Date of Encounter: 12/03/2020  Consulting Cardiologist: Jettie Booze, MD   Please see cardiology consultation from August 19, I reviewed interval chart and spoke with nursing as well.  Patient not exhibiting any substantial neurological recovery as yet, remains with supportive measures, on the ventilator.  IV amiodarone has been transitioned to pills.  Cardiac rhythm sinus this morning, episodes of PSVT noted per telemetry.  No VT.  Pertinent lab work includes potassium 3.5, creatinine 1.4, hemoglobin 11.4.  Echocardiogram from August 18 shows LVEF 40 to 45% with global hypokinesis, normal RV contraction.  Cardiology will follow in the background pending neurological recovery.  If she does make improvement, follow-up with cardiac catheterization can be further discussed.  Signed, Rozann Lesches, MD  12/03/2020, 9:52 AM

## 2020-12-04 ENCOUNTER — Inpatient Hospital Stay (HOSPITAL_COMMUNITY): Payer: Medicaid Other

## 2020-12-04 DIAGNOSIS — G931 Anoxic brain damage, not elsewhere classified: Secondary | ICD-10-CM | POA: Diagnosis not present

## 2020-12-04 DIAGNOSIS — I469 Cardiac arrest, cause unspecified: Secondary | ICD-10-CM | POA: Diagnosis not present

## 2020-12-04 LAB — BASIC METABOLIC PANEL
Anion gap: 6 (ref 5–15)
BUN: 16 mg/dL (ref 6–20)
CO2: 20 mmol/L — ABNORMAL LOW (ref 22–32)
Calcium: 8.6 mg/dL — ABNORMAL LOW (ref 8.9–10.3)
Chloride: 114 mmol/L — ABNORMAL HIGH (ref 98–111)
Creatinine, Ser: 0.99 mg/dL (ref 0.44–1.00)
GFR, Estimated: >60 mL/min (ref >60)
Glucose, Bld: 137 mg/dL — ABNORMAL HIGH (ref 70–99)
Potassium: 4.4 mmol/L (ref 3.5–5.1)
Sodium: 140 mmol/L (ref 135–145)

## 2020-12-04 LAB — PHOSPHORUS: Phosphorus: 3.7 mg/dL (ref 2.5–4.6)

## 2020-12-04 LAB — CBC
HCT: 38.9 % (ref 36.0–46.0)
Hemoglobin: 11.6 g/dL — ABNORMAL LOW (ref 12.0–15.0)
MCH: 22.5 pg — ABNORMAL LOW (ref 26.0–34.0)
MCHC: 29.8 g/dL — ABNORMAL LOW (ref 30.0–36.0)
MCV: 75.4 fL — ABNORMAL LOW (ref 80.0–100.0)
Platelets: 233 10*3/uL (ref 150–400)
RBC: 5.16 MIL/uL — ABNORMAL HIGH (ref 3.87–5.11)
RDW: 17.2 % — ABNORMAL HIGH (ref 11.5–15.5)
WBC: 11.6 10*3/uL — ABNORMAL HIGH (ref 4.0–10.5)
nRBC: 0 % (ref 0.0–0.2)

## 2020-12-04 LAB — GLUCOSE, CAPILLARY
Glucose-Capillary: 135 mg/dL — ABNORMAL HIGH (ref 70–99)
Glucose-Capillary: 146 mg/dL — ABNORMAL HIGH (ref 70–99)
Glucose-Capillary: 152 mg/dL — ABNORMAL HIGH (ref 70–99)
Glucose-Capillary: 175 mg/dL — ABNORMAL HIGH (ref 70–99)
Glucose-Capillary: 182 mg/dL — ABNORMAL HIGH (ref 70–99)
Glucose-Capillary: 191 mg/dL — ABNORMAL HIGH (ref 70–99)

## 2020-12-04 LAB — PROCALCITONIN: Procalcitonin: 0.9 ng/mL

## 2020-12-04 LAB — MAGNESIUM: Magnesium: 2.1 mg/dL (ref 1.7–2.4)

## 2020-12-04 LAB — HEPARIN LEVEL (UNFRACTIONATED): Heparin Unfractionated: 0.29 IU/mL — ABNORMAL LOW (ref 0.30–0.70)

## 2020-12-04 MED ORDER — SODIUM CHLORIDE 0.9 % IV SOLN
2.0000 g | INTRAVENOUS | Status: AC
  Administered 2020-12-04 – 2020-12-08 (×5): 2 g via INTRAVENOUS
  Filled 2020-12-04 (×5): qty 20

## 2020-12-04 MED ORDER — CHLORHEXIDINE GLUCONATE 0.12 % MT SOLN
OROMUCOSAL | Status: AC
  Administered 2020-12-04: 15 mL via OROMUCOSAL
  Filled 2020-12-04: qty 15

## 2020-12-04 NOTE — Progress Notes (Signed)
[facility] Progress Note Patient Name: Courtney Davies DOB: [birth year] MRN: [id number]   Date of Service  12/04/2020  HPI/Events of Note  Shivering with TTM pads on. BP = 162/72 with HR = 92.  eICU Interventions  Plan: Increase ceiling on Fentanyl IV infusion to 400 mcg/hour. Increase ceiling on Propofol IV infusion to 80 mcg/kg/min.     Intervention Category Major Interventions: Other:  Courtney Davies 12/04/2020, 2:07 AM

## 2020-12-04 NOTE — Progress Notes (Addendum)
NAME:  Courtney Davies, MRN:  [id number], DOB:  May 26, 1975, LOS: 3 ADMISSION DATE:  12/01/2020, CONSULTATION DATE:  12/01/20 REFERRING MD:  Karle Starch - EM, CHIEF COMPLAINT:  Vfib arrest   History of Present Illness:  45 yo F PMH HTN DM2 B12 def GERD OA presented to [facility] 8/18 as OOH Vfib ~10 min arrest (although may have been much longer, conflicting reports).  COVID +, no STEMI.  CTA neg for PE.  Pertinent  Medical History  B12 deficiency DM GERD Dysphagia  Bronchitis  OA  L knee pain  Significant Hospital Events: Including procedures, antibiotic start and stop dates in addition to other pertinent events   8/18 ED via EMS as Vfib arrest with ROSC, 5 cycles Epi 5x DF and 469m amio. ECG without acute ST segment changes. COVID positive  8/19 sedation hold and extubation, not following commands, started to spike fevers, not protecting airway so reintubated, CVL placed  Interim History / Subjective:  Increasing hypotension overnight; question of herniation so sent for head CT: neg.  Objective   Blood pressure 114/74, pulse 76, temperature 98.4 F (36.9 C), temperature source Rectal, resp. rate 12, height 5' 6"  (1.676 m), weight 105.3 kg, SpO2 100 %.    Vent Mode: PRVC FiO2 (%):  [40 %] 40 % Set Rate:  [18 bmp] 18 bmp Vt Set:  [480 mL] 480 mL PEEP:  [5 cmH20] 5 cmH20 Plateau Pressure:  [16 cmH20-21 cmH20] 21 cmH20   Intake/Output Summary (Last 24 hours) at 12/04/2020 0754 Last data filed at 12/04/2020 0600 Gross per 24 hour  Intake 4514.53 ml  Output 1305 ml  Net 3209.53 ml    Filed Weights   12/01/20 0900 12/02/20 0500 12/03/20 0418  Weight: 100 kg 101.6 kg 105.3 kg    Examination: Heavily sedated this AM Eyes midline, +corneals, pupils small but reactive not responding to pain for me: going to lighten sedation Lungs with rhonci bilaterally, triggers vent Abdomen soft C collar in place  CBG ok Cr improved Stable mild leukocytosis  Resolved Hospital Problem list    Shock liver  Assessment & Plan:  OOH Vfib Arrest- COVID +, trop leak, nonischemic EKG, question COVID-associated arrhythmia vs. Myocarditis vs. ACS Resp failure- due to inability to handle secretions, recurrent and reintubated 8/19 COVID +: CT not impressive for COVID pneumonitis Hx of asthma- was wheezing apparently on ROSC but collapsed at work without preceding respiratory symptoms Mild AKI  post arrest ATN, improved Encephalopathy- unfortunately may some significant anoxic injury here Afib- reactive, intermittent Shock- see how she does without sedation as high propofol dose may be culprit here Aspiration pneumonitis- broadened to cefepime 8/20; changed back to ceftriaxone 8/21 (cefepime will mess with EEG and prognostication, can do zosyn if broadening needed but I do not really think there is a new infection here) Hx DM- sugars fine here  - Stop sedation to see where we stand, will likely get MRI and neuro/palliative evaluation to help family through this process; appreciate help - Family to come in tomorrow to touch base since there are some intra-family communication issues - Vent support, VAP prevention bundle - Heparin gtt and PO amio - Steroids x 7 days, baricitinib (already on vent) and remdesivir (shock liver) unlikely to provide benefit here - C collar per institutional protocol (despite neg C spine CT) - Ceftriaxone x 7 days - Pressors titrated to MAP 65  Best Practice (right click and "Reselect all SmartList Selections" daily)   Diet/type: TF DVT prophylaxis: heparin  gtt GI prophylaxis: PPI Lines: yes, needed Foley:  Yes, and it is still needed Code Status:  full code Last date of multidisciplinary goals of care discussion [family meeting planned for 8/22]  38 minutes cc time not including any separately billable procedures Erskine Emery MD PCCM

## 2020-12-04 NOTE — Consult Note (Signed)
Neurology Consultation Reason for Consult: Prognosis Referring Physician: Tamala Julian, D  CC: Anoxia  History is obtained from:chart  HPI: Courtney Davies is a 45 y.o. female with a history of DM2, htn who had a vfib arrest while working on 08/18. Shh is covid positive. EEG on 08/19 with only slowing. She was initially opening her eyes some and therefore was extubated, but after extubation, her mental status was not handling secretions and mental status was not adequate to protect airway so she was re-intubated.   Since that time, she may have had some mild improvemetn, but still severely encephalopathic and therefore neurology has been consulted.   LKW: 8/18, am  ROS:  Unable to obtain due to altered mental status.   Past Medical History:  Diagnosis Date   Anemia    Diabetes type 2, controlled ([place])    GERD (gastroesophageal reflux disease)    Hypertension      FHx: Unable to assess secondary to patient's altered mental status.     Social History:Unable to assess secondary to patient's altered mental status.    Exam: Current vital signs: BP 114/74   Pulse 76   Temp 98.4 F (36.9 C) (Rectal)   Resp 12   Ht 5' 6"  (1.676 m)   Wt 105.3 kg Comment: artic sun pads added since last weight  SpO2 100%   BMI 37.47 kg/m  Vital signs in last 24 hours: Temp:  [98.1 F (36.7 C)-99.9 F (37.7 C)] 98.4 F (36.9 C) (08/21 0600) Pulse Rate:  [61-115] 76 (08/21 0630) Resp:  [0-27] 12 (08/21 0630) BP: (94-195)/(54-104) 114/74 (08/21 0630) SpO2:  [96 %-100 %] 100 % (08/21 0630) Arterial Line BP: (79-194)/(41-127) 114/54 (08/21 0630) FiO2 (%):  [40 %] 40 % (08/21 0400)   Physical Exam  Constitutional: Appears well-developed and well-nourished.  Psych: unresponsive Eyes: No scleral injection HENT: ET tube in place. C-collar in place MSK: no joint deformities.  Cardiovascular: Normal rate and regular rhythm.  Respiratory: Effort normal, non-labored breathing GI: Soft.  No  distension. There is no tenderness.  Skin: WDI  Neuro: Mental Status: Patient is obtunded, but does open eyes with noxious stimulation. She does not follow commands. She does not fixate or engage the examiner.  Cranial Nerves: II: Does nto blink to threat. Pupils are equal, round, and reactive to light.   III,IV, VI: unable to perform doll's due to c-collar.  V,VII: corneals are intact X: cough intact Motor: She has mildly incerased tone, with noxious stimulation of the right arm she grips her hand, on the left she flexes the arm, but I have the impression that there is some cortical component to her movement(e.g. more complex than decorticate posturing), but she does not reliably withdraw or localize.  She withdraws in bilaterally lower extremities.  Sensory: As above.  Deep Tendon Reflexes: 2+ and symmetric in the biceps and patellae.  Plantars: Toes are up on right, down on left.  Cerebellar: Doe snot perform.    I have reviewed labs in epic and the results pertinent to this consultation are: Cr 0.99  I have reviewed the images obtained:CT head on admission- negative.    Impression: 45 yo F with anoxic brain injury after vfib cardiac arrest in the setting of COVID. I suspect that she has had some degree of injury, but with some suggestion of cortical function and intact brainstem in a young patient, I would favor continued supportive care at this time. An MRI could be helpful given a slightly  asymmetric exam.   Recommendations: 1) MRI brain(also going for C-spine to clear neck) 2) will continue to follow.   This patient is critically ill and at significant risk of neurological worsening, death and care requires constant monitoring of vital signs, hemodynamics,respiratory and cardiac monitoring, neurological assessment, discussion with family, other specialists and medical decision making of high complexity. I spent 35 minutes of neurocritical care time  in the care of  this  patient. This was time spent independent of any time provided by nurse practitioner or PA.  [name] Rack, MD [facility] [phone]  If 7pm- 7am, please page neurology on call as listed in [place]. 12/04/2020  8:37 AM

## 2020-12-04 NOTE — Progress Notes (Signed)
ANTICOAGULATION CONSULT NOTE   Pharmacy Consult for Heparin Indication: chest pain/ACS   Labs: Recent Labs    12/01/20 0913 12/01/20 0918 12/01/20 1706 12/02/20 0450 12/02/20 0834 12/02/20 1757 12/03/20 0123 12/03/20 0516 12/03/20 1815 12/04/20 0431  HGB 12.7   < >  --  12.8  --   --   --  11.4* 10.7* 11.6*  HCT 42.7   < >  --  40.7  --   --   --  36.5 35.2* 38.9  PLT 237  --   --  210  --   --   --  212 207 233  HEPARINUNFRC  --   --   --   --   --    < > 0.51 0.57  --  0.29*  CREATININE 1.21*   < >  --  1.23*  --   --   --  1.40*  --  0.99  TROPONINIHS 65*  --  5,186*  --  3,074*  --   --   --   --   --    < > = values in this interval not displayed.    Assessment: Courtney Davies admitted after VF arrest.  Troponin  elevated will be evaluated for ischemia.  Heparin level below goal (0.29) on 1400 units/hr. Hgb stable today at 11. No bleeding noted, will follow closely.   Goal of Therapy:  Heparin level 0.3-0.7 units/ml Monitor platelets by anticoagulation protocol: Yes   Plan:  Increase IV heparin to 1500 units/hr. Daily heparin level and CBC.  Thanks for allowing pharmacy to be a part of this patient's care.  Erin Hearing PharmD., BCPS Clinical Pharmacist 12/04/2020 7:38 AM

## 2020-12-04 NOTE — Progress Notes (Signed)
SLP Cancellation Note  Patient Details Name: Courtney Davies MRN: [id number] DOB: 01/05/1976   Cancelled treatment:       Reason Eval/Treat Not Completed: Patient not medically ready (Pt remains intubated at this time. [place] meeting planned for 8/22. SLP will follow up.)  Osman Calzadilla I. Hardin Negus, [place], [place] Office number [phone] Pager [phone]  Horton Marshall 12/04/2020, 7:03 AM

## 2020-12-04 NOTE — Progress Notes (Signed)
PT Cancellation Note  Patient Details Name: Courtney Davies MRN: [id number] DOB: Feb 13, 1976   Cancelled Treatment:    Reason Eval/Treat Not Completed: Medical issues which prohibited therapy;Patient not medically ready (Pt sedated and [place] meeting 8/22. Nurse asked for PT to check back on Tues 8/23.)   Courtney Davies 12/04/2020, 9:54 AM Zayveon Raschke M,PT Acute Rehab Services [phone] (pager)

## 2020-12-04 NOTE — Progress Notes (Signed)
Neurology Progress Note   Patient ID: Courtney Davies is a 45 y.o. with PMHx of  has a past medical history of Anemia, Diabetes type 2, controlled ([place]), GERD (gastroesophageal reflux disease), and Hypertension, asthma, smoking, s/p Vfib arrest 8/18, COVID(+), back to work at time of arrest, and otherwise asymptomatic per prior notes. Extubated 8/18 at noon, opened eyes to voice, not following commands, became febrile with worsening secretion and not clearing airways, reintubated at 5 PM. 8/20 felt to have decorticate posturing, started on levophed and cefepime, HCT negative.   Initially consulted for: Encephalopathy  Pertinent workup to date:  Afib on monitor 8/19, started on amio, back in NSR Troponin peaked at [facility] 12/02/20  1. Left ventricular ejection fraction, by estimation, is 40 to 45%. The  left ventricle has mildly decreased function. The left ventricle  demonstrates global hypokinesis. There is mild left ventricular  hypertrophy. Left ventricular diastolic parameters  are consistent with Grade I diastolic dysfunction (impaired relaxation).   2. Right ventricular systolic function is normal. The right ventricular  size is normal. There is normal pulmonary artery systolic pressure.   3. The mitral valve is normal in structure. Mild to moderate mitral valve  regurgitation. No evidence of mitral stenosis.   4. The aortic valve was not well visualized. Aortic valve regurgitation  is not visualized. Mild aortic valve sclerosis is present, with no  evidence of aortic valve stenosis.  EEG 8/19 Description: EEG showed continuous generalized polymorphic sharply contoured 5 to 6 Hz theta slowing. Hyperventilation and photic stimulation were not performed.    ABNORMALITY - Continuous slow, generalized IMPRESSION: This study is suggestive of moderate diffuse encephalopathy, nonspecific etiology but could be related to sedation. No seizures or epileptiform discharges were seen throughout  the recording.     Subjective and major interval events: - Has been on high sedation for coughing on vent (375 fentanyl, 60 propfol)    Exam: - Fentanyl reduced to 200 and propfol to 30  Current vital signs: BP 128/71   Pulse (!) 53   Temp (!) 94.3 F (34.6 C) (Axillary)   Resp 16   Ht 5' 6"  (1.676 m)   Wt 105.3 kg Comment: artic sun pads added since last weight  SpO2 100%   BMI 37.47 kg/m  Vital signs in last 24 hours: Temp:  [94.3 F (34.6 C)-98.1 F (36.7 C)] 94.3 F (34.6 C) (08/22 0830) Pulse Rate:  [53-92] 53 (08/22 0645) Resp:  [12-23] 16 (08/22 0645) BP: (107-164)/(59-99) 128/71 (08/22 0645) SpO2:  [95 %-100 %] 100 % (08/22 0734) Arterial Line BP: (92-187)/(50-92) 128/66 (08/22 0645) FiO2 (%):  [40 %] 40 % (08/22 0734)   Gen: In bed, comfortable  Psych: Unresponsive Pulm: ETT in place, comfortable on vent non-labored breathing, no grossly audible wheezing Cardiac: Perfusing extremities well  Abd: soft, nt  Neuro:  Mental Status: Patient is obtunded, but does open eyes with noxious stimulation. She does not follow commands. She does not fixate or engage the examiner.  Cranial Nerves: II: Does not blink to threat. Pupils are equal, round, and reactive to light.   III,IV, VI: occasionally dysconjugate gaze, but more conjugate when opening eyes to noxious stim. No clear VOR   V,VII: corneals are intact X: cough intact Motor: Normal to low tone today on sedation. Withdraws bilateral upper extremites. Trace movement left lower, toe flexion of the right lower with promixal noxious stim Sensory: As above.  Plantars: Toes are down bilaterally today  Cerebellar: Unable  to assess secondary to patient's mental status   Pertinent Labs:  Basic Metabolic Panel: Recent Labs  Lab 12/01/20 0913 12/01/20 0918 12/01/20 0940 12/01/20 1256 12/02/20 0450 12/02/20 1757 12/03/20 0516 12/03/20 1815 12/04/20 0431  NA 136 138 139 140 140  --  138  --  140  K 3.7  3.6 3.7 4.0 3.4*  --  3.5  --  4.4  CL 109 109  --   --  112*  --  112*  --  114*  CO2 11*  --   --   --  19*  --  19*  --  20*  GLUCOSE 368* 358*  --   --  140*  --  134*  --  137*  BUN 13 13  --   --  16  --  19  --  16  CREATININE 1.21* 0.90  --   --  1.23*  --  1.40*  --  0.99  CALCIUM 7.8*  --   --   --  8.4*  --  8.3*  --  8.6*  MG  --   --   --   --  1.6* 2.4 2.4 2.2 2.1  PHOS  --   --   --   --  4.6 4.7* 4.4 3.4 3.7    CBC: Recent Labs  Lab 12/01/20 0913 12/01/20 0918 12/01/20 1256 12/02/20 0450 12/03/20 0516 12/03/20 1815 12/04/20 0431  WBC 15.8*  --   --  8.8 13.3* 10.4 11.6*  NEUTROABS 8.2*  --   --   --   --   --   --   HGB 12.7   < > 14.6 12.8 11.4* 10.7* 11.6*  HCT 42.7   < > 43.0 40.7 36.5 35.2* 38.9  MCV 75.6*  --   --  72.5* 72.9* 73.9* 75.4*  PLT 237  --   --  210 212 207 233   < > = values in this interval not displayed.    Coagulation Studies: No results for input(s): LABPROT, INR in the last 72 hours.    MRI brain personally reviewed, agree with radiology:  Areas of bilateral cerebral cortical reduced diffusion. Reduced diffusion along the hippocampi. This is consistent with hypoxic/ischemic injury. MRI C-spine personally reviewed, agree with radiology, no acute process  Impression: Evidence of some anoxic brain injury on MRI however continues to have cortical function on exam even still on significant sedation, indeterminate prognosis at this time  Recommendations: - Aggressively wean sedation to allow better assessment of neurological status - Neurology will continue to follow   [place] [phone]   CRITICAL CARE Performed by: Lorenza Chick   Total critical care time: 40 minutes  Critical care time was exclusive of separately billable procedures and treating other patients.  Critical care was necessary to treat or prevent imminent or life-threatening deterioration.  Critical care was time  spent personally by me on the following activities: development of treatment plan with patient and/or surrogate as well as nursing, discussions with consultants, evaluation of patient's response to treatment, examination of patient, obtaining history from patient or surrogate, ordering and performing treatments and interventions, ordering and review of laboratory studies, ordering and review of radiographic studies, pulse oximetry and re-evaluation of patient's condition.

## 2020-12-04 NOTE — Progress Notes (Signed)
OT Cancellation Note  Patient Details Name: Courtney Davies MRN: [id number] DOB: September 22, 1975   Cancelled Treatment:    Reason Eval/Treat Not Completed: Patient not medically ready.  Will check back next week for readiness and appropriateness.  Nilsa Nutting., OTR/L Acute Rehabilitation Services Pager [phone] Office [phone]   Lucille Passy M 12/04/2020, 10:27 AM

## 2020-12-04 NOTE — Progress Notes (Signed)
Pt was transported to MRI and back without complication.

## 2020-12-04 NOTE — Progress Notes (Signed)
Woke up gagging on tube. Shock resolved with sedation lightening. No command following. No purposeful movements. + cortical responses per neuro eval at bedside. Proceed with MRI, supportive care, will get C spine as well so we can get collar off.  Erskine Emery MD PCCM

## 2020-12-05 ENCOUNTER — Inpatient Hospital Stay (HOSPITAL_COMMUNITY): Payer: Medicaid Other

## 2020-12-05 ENCOUNTER — Other Ambulatory Visit: Payer: Self-pay

## 2020-12-05 ENCOUNTER — Encounter (HOSPITAL_COMMUNITY): Payer: Self-pay | Admitting: Pulmonary Disease

## 2020-12-05 DIAGNOSIS — I10 Essential (primary) hypertension: Secondary | ICD-10-CM | POA: Diagnosis not present

## 2020-12-05 DIAGNOSIS — I469 Cardiac arrest, cause unspecified: Secondary | ICD-10-CM | POA: Diagnosis not present

## 2020-12-05 DIAGNOSIS — E109 Type 1 diabetes mellitus without complications: Secondary | ICD-10-CM

## 2020-12-05 DIAGNOSIS — U071 COVID-19: Secondary | ICD-10-CM

## 2020-12-05 DIAGNOSIS — G9340 Encephalopathy, unspecified: Secondary | ICD-10-CM

## 2020-12-05 DIAGNOSIS — G931 Anoxic brain damage, not elsewhere classified: Secondary | ICD-10-CM | POA: Diagnosis not present

## 2020-12-05 LAB — HEPARIN LEVEL (UNFRACTIONATED): Heparin Unfractionated: 0.53 IU/mL (ref 0.30–0.70)

## 2020-12-05 LAB — GLUCOSE, CAPILLARY
Glucose-Capillary: 118 mg/dL — ABNORMAL HIGH (ref 70–99)
Glucose-Capillary: 149 mg/dL — ABNORMAL HIGH (ref 70–99)
Glucose-Capillary: 151 mg/dL — ABNORMAL HIGH (ref 70–99)
Glucose-Capillary: 186 mg/dL — ABNORMAL HIGH (ref 70–99)
Glucose-Capillary: 193 mg/dL — ABNORMAL HIGH (ref 70–99)
Glucose-Capillary: 215 mg/dL — ABNORMAL HIGH (ref 70–99)
Glucose-Capillary: 252 mg/dL — ABNORMAL HIGH (ref 70–99)

## 2020-12-05 LAB — TSH: TSH: 2.467 u[IU]/mL (ref 0.350–4.500)

## 2020-12-05 LAB — CBC
HCT: 36.1 % (ref 36.0–46.0)
Hemoglobin: 10.7 g/dL — ABNORMAL LOW (ref 12.0–15.0)
MCH: 22.3 pg — ABNORMAL LOW (ref 26.0–34.0)
MCHC: 29.6 g/dL — ABNORMAL LOW (ref 30.0–36.0)
MCV: 75.2 fL — ABNORMAL LOW (ref 80.0–100.0)
Platelets: 202 10*3/uL (ref 150–400)
RBC: 4.8 MIL/uL (ref 3.87–5.11)
RDW: 17.4 % — ABNORMAL HIGH (ref 11.5–15.5)
WBC: 9.7 10*3/uL (ref 4.0–10.5)
nRBC: 0 % (ref 0.0–0.2)

## 2020-12-05 LAB — BASIC METABOLIC PANEL
Anion gap: 5 (ref 5–15)
BUN: 11 mg/dL (ref 6–20)
CO2: 21 mmol/L — ABNORMAL LOW (ref 22–32)
Calcium: 8.6 mg/dL — ABNORMAL LOW (ref 8.9–10.3)
Chloride: 115 mmol/L — ABNORMAL HIGH (ref 98–111)
Creatinine, Ser: 0.84 mg/dL (ref 0.44–1.00)
GFR, Estimated: >60 mL/min (ref >60)
Glucose, Bld: 192 mg/dL — ABNORMAL HIGH (ref 70–99)
Potassium: 4.5 mmol/L (ref 3.5–5.1)
Sodium: 141 mmol/L (ref 135–145)

## 2020-12-05 LAB — PROCALCITONIN: Procalcitonin: 0.5 ng/mL

## 2020-12-05 LAB — T4, FREE: Free T4: 1.02 ng/dL (ref 0.61–1.12)

## 2020-12-05 LAB — MAGNESIUM: Magnesium: 2.1 mg/dL (ref 1.7–2.4)

## 2020-12-05 MED ORDER — IPRATROPIUM-ALBUTEROL 0.5-2.5 (3) MG/3ML IN SOLN
3.0000 mL | Freq: Four times a day (QID) | RESPIRATORY_TRACT | Status: DC
  Administered 2020-12-05 – 2020-12-06 (×3): 3 mL via RESPIRATORY_TRACT
  Filled 2020-12-05 (×3): qty 3

## 2020-12-05 MED ORDER — INSULIN ASPART 100 UNIT/ML IJ SOLN
0.0000 [IU] | INTRAMUSCULAR | Status: DC
  Administered 2020-12-05: 11 [IU] via SUBCUTANEOUS
  Administered 2020-12-05: 7 [IU] via SUBCUTANEOUS
  Administered 2020-12-05 – 2020-12-06 (×3): 4 [IU] via SUBCUTANEOUS
  Administered 2020-12-06: 7 [IU] via SUBCUTANEOUS
  Administered 2020-12-06: 4 [IU] via SUBCUTANEOUS
  Administered 2020-12-06: 11 [IU] via SUBCUTANEOUS
  Administered 2020-12-07 (×6): 4 [IU] via SUBCUTANEOUS
  Administered 2020-12-07: 3 [IU] via SUBCUTANEOUS
  Administered 2020-12-08: 7 [IU] via SUBCUTANEOUS
  Administered 2020-12-08 (×2): 4 [IU] via SUBCUTANEOUS
  Administered 2020-12-08: 7 [IU] via SUBCUTANEOUS
  Administered 2020-12-08 – 2020-12-09 (×3): 4 [IU] via SUBCUTANEOUS
  Administered 2020-12-09 (×2): 7 [IU] via SUBCUTANEOUS
  Administered 2020-12-09 (×2): 4 [IU] via SUBCUTANEOUS
  Administered 2020-12-10 (×2): 7 [IU] via SUBCUTANEOUS
  Administered 2020-12-10 (×2): 4 [IU] via SUBCUTANEOUS
  Administered 2020-12-10: 7 [IU] via SUBCUTANEOUS
  Administered 2020-12-11: 4 [IU] via SUBCUTANEOUS
  Administered 2020-12-11: 3 [IU] via SUBCUTANEOUS
  Administered 2020-12-11 (×2): 7 [IU] via SUBCUTANEOUS
  Administered 2020-12-11 – 2020-12-12 (×2): 4 [IU] via SUBCUTANEOUS
  Administered 2020-12-12: 11 [IU] via SUBCUTANEOUS
  Administered 2020-12-12: 4 [IU] via SUBCUTANEOUS
  Administered 2020-12-12 – 2020-12-13 (×2): 7 [IU] via SUBCUTANEOUS
  Administered 2020-12-13: 11 [IU] via SUBCUTANEOUS
  Administered 2020-12-13: 3 [IU] via SUBCUTANEOUS
  Administered 2020-12-14 (×2): 7 [IU] via SUBCUTANEOUS
  Administered 2020-12-14: 4 [IU] via SUBCUTANEOUS
  Administered 2020-12-14: 11 [IU] via SUBCUTANEOUS
  Administered 2020-12-15: 4 [IU] via SUBCUTANEOUS
  Administered 2020-12-15: 7 [IU] via SUBCUTANEOUS
  Administered 2020-12-15: 4 [IU] via SUBCUTANEOUS
  Administered 2020-12-15 – 2020-12-16 (×4): 7 [IU] via SUBCUTANEOUS
  Administered 2020-12-16 (×2): 4 [IU] via SUBCUTANEOUS
  Administered 2020-12-16: 11 [IU] via SUBCUTANEOUS
  Administered 2020-12-16: 7 [IU] via SUBCUTANEOUS
  Administered 2020-12-17: 4 [IU] via SUBCUTANEOUS
  Administered 2020-12-17: 11 [IU] via SUBCUTANEOUS
  Administered 2020-12-17: 7 [IU] via SUBCUTANEOUS
  Administered 2020-12-17: 11 [IU] via SUBCUTANEOUS
  Administered 2020-12-17: 4 [IU] via SUBCUTANEOUS
  Administered 2020-12-17: 15 [IU] via SUBCUTANEOUS
  Administered 2020-12-18 (×3): 11 [IU] via SUBCUTANEOUS
  Administered 2020-12-18: 7 [IU] via SUBCUTANEOUS
  Administered 2020-12-18: 4 [IU] via SUBCUTANEOUS
  Administered 2020-12-18: 11 [IU] via SUBCUTANEOUS
  Administered 2020-12-19 (×3): 7 [IU] via SUBCUTANEOUS
  Administered 2020-12-19 – 2020-12-20 (×3): 4 [IU] via SUBCUTANEOUS
  Administered 2020-12-20 (×2): 11 [IU] via SUBCUTANEOUS
  Administered 2020-12-20: 7 [IU] via SUBCUTANEOUS
  Administered 2020-12-21: 15 [IU] via SUBCUTANEOUS
  Administered 2020-12-21 (×2): 4 [IU] via SUBCUTANEOUS
  Administered 2020-12-21: 3 [IU] via SUBCUTANEOUS
  Administered 2020-12-21 – 2020-12-22 (×2): 4 [IU] via SUBCUTANEOUS
  Administered 2020-12-22 – 2020-12-23 (×2): 3 [IU] via SUBCUTANEOUS
  Administered 2020-12-23 (×3): 4 [IU] via SUBCUTANEOUS
  Administered 2020-12-24: 3 [IU] via SUBCUTANEOUS
  Administered 2020-12-24 (×2): 4 [IU] via SUBCUTANEOUS
  Administered 2020-12-24: 11 [IU] via SUBCUTANEOUS
  Administered 2020-12-24: 15 [IU] via SUBCUTANEOUS
  Administered 2020-12-24: 7 [IU] via SUBCUTANEOUS
  Administered 2020-12-25: 4 [IU] via SUBCUTANEOUS
  Administered 2020-12-25 – 2020-12-26 (×4): 3 [IU] via SUBCUTANEOUS
  Administered 2020-12-27: 4 [IU] via SUBCUTANEOUS
  Administered 2020-12-28: 3 [IU] via SUBCUTANEOUS
  Administered 2020-12-29: 15 [IU] via SUBCUTANEOUS
  Administered 2020-12-29 (×2): 4 [IU] via SUBCUTANEOUS
  Administered 2020-12-29: 3 [IU] via SUBCUTANEOUS
  Administered 2020-12-30: 7 [IU] via SUBCUTANEOUS
  Administered 2020-12-30: 3 [IU] via SUBCUTANEOUS
  Administered 2020-12-30: 4 [IU] via SUBCUTANEOUS
  Administered 2020-12-30: 3 [IU] via SUBCUTANEOUS
  Administered 2020-12-31 (×2): 4 [IU] via SUBCUTANEOUS
  Administered 2020-12-31 (×3): 3 [IU] via SUBCUTANEOUS
  Administered 2021-01-01 (×2): 4 [IU] via SUBCUTANEOUS
  Administered 2021-01-01: 11 [IU] via SUBCUTANEOUS
  Administered 2021-01-01: 3 [IU] via SUBCUTANEOUS
  Administered 2021-01-02: 4 [IU] via SUBCUTANEOUS
  Administered 2021-01-02: 7 [IU] via SUBCUTANEOUS
  Administered 2021-01-02: 4 [IU] via SUBCUTANEOUS

## 2020-12-05 MED ORDER — DEXMEDETOMIDINE HCL IN NACL 400 MCG/100ML IV SOLN
0.4000 ug/kg/h | INTRAVENOUS | Status: DC
  Administered 2020-12-05 – 2020-12-06 (×2): 0.5 ug/kg/h via INTRAVENOUS
  Filled 2020-12-05 (×3): qty 100

## 2020-12-05 NOTE — Progress Notes (Signed)
NAME:  Courtney Davies, MRN:  [id number], DOB:  June 19, 1975, LOS: 4 ADMISSION DATE:  12/01/2020, CONSULTATION DATE:  12/01/20 REFERRING MD:  Karle Starch - EM, CHIEF COMPLAINT:  Vfib arrest   History of Present Illness:  45 yo F PMH HTN DM2 B12 def GERD OA presented to [facility] 8/18 as OOH Vfib ~10 min arrest (although may have been much longer, conflicting reports).  COVID +, no STEMI.  CTA neg for PE.   History from family the patient was diagnosed with COVID 4 weeks prior to event, she had recovered fine and was back at work.  She has not had any preceding known chest pain or shortness of breath and patient is generally someone who takes care of herself and will be able seen by a medical provider when she is concerned about her health.  Pertinent  Medical History  B12 deficiency DM GERD Dysphagia  Bronchitis  OA  L knee pain  Significant Hospital Events: Including procedures, antibiotic start and stop dates in addition to other pertinent events   8/18 ED via EMS as Vfib arrest with ROSC, 5 cycles Epi 5x DF and 417m amio. ECG without acute ST segment changes. COVID positive  8/19 sedation hold and extubation, not following commands, started to spike fevers, not protecting airway so reintubated, CVL placed 8/22 Heavily sedated overnight, transition to Precedex, trial SBT/SAT  Interim History / Subjective:   Off pressors this morning, +3 L since admit, 3 L UOP yesterday No leukocytosis, no renal failure  MRI On 8/21>>Areas of bilateral cerebral cortical reduced diffusion. Reduced diffusion along the hippocampi consistent with hypoxic/ischemic injury.  Objective   Blood pressure 128/71, pulse (!) 53, temperature (!) 94.3 F (34.6 C), temperature source Axillary, resp. rate 16, height 5' 6"  (1.676 m), weight 105.3 kg, SpO2 100 %.    Vent Mode: PRVC FiO2 (%):  [40 %] 40 % Set Rate:  [18 bmp] 18 bmp Vt Set:  [480 mL] 480 mL PEEP:  [5 cmH20] 5 cmH20 Plateau Pressure:  [17 cmH20-22 cmH20]  17 cmH20   Intake/Output Summary (Last 24 hours) at 12/05/2020 0902 Last data filed at 12/05/2020 0600 Gross per 24 hour  Intake 4204.08 ml  Output 3550 ml  Net 654.08 ml    Filed Weights   12/01/20 0900 12/02/20 0500 12/03/20 0418  Weight: 100 kg 101.6 kg 105.3 kg     General: Overweight female, intubated and sedated HEENT: MM pink/moist, pupils sluggishly reactive to light, sclera anicteric, ETT in place Neuro: Examined immediately after propofol discontinuation, restless leg moving extremities, triggering breaths on vent but not following commands or CV: s1s2 RRR, no m/r/g PULM: Expiratory wheezing throughout, vent pressures at goal, full vent support GI: soft, bsx4 active  Extremities: warm/dry, no edema  Skin: no rashes or lesions      Resolved Hospital Problem list   Shock liver Shock AKI  Assessment & Plan:    Witnessed out of hospital Vfib arrest with acute respiratory failure in the setting of baseline asthma and recent COVID-19 infection  COVID +, trop leak, nonischemic EKG, question COVID-associated arrhythmia vs. Myocarditis vs. ACS Reported 10 minute down time [facility] Ext[facility] 8/19, reintubated for failure to protect her airway P: -appreciate cardiology input, no significant arrhythmia over the last 24hrs now on oral amiodarone, if she has meaningful neurologic recovery plan to proceed with cardiac cath and cardiac evaluation -Echo with no LV dilation or RV dysfunction, EF 40 to 45% -  Initially required pressors, now off -Family reports she had fully recovered from [place] at the time of this event, CT chest without obvious COVID pneumonitis -Goal to wean propofol and fentanyl today, start Precedex, continued SBT/SAT -Maintain full vent support with SAT/SBT as tolerated -titrate Vent setting to maintain SpO2 greater than or equal to 90%. -HOB elevated 30 degrees. -Plateau pressures less than 30 cm H20.  -Follow chest  x-ray, ABG prn.   -Bronchial hygiene and RT/bronchodilator protocol. -continue solumedrol, Baricitinib and Remdesevir were not indicated given already on the vent and shock liver -Hopeful she will progress to extubation, but discussed with family the possibility of tracheostomy, palliative consulted  -C-spine cleared (initial fall)     Acute Encephalopathy MRI shows areas of bilateral cerebral cortical reduced diffusion. Reduced diffusion along the hippocampi consistent with hypoxic/ischemic injury. P: -per notes was responsive prior to extubation, trial Precedex, stop propofol and fentanyl and continue to monitor mental status -no seizure like activity  Afib Intermittent, now in sinus P: -continue oral amiodarone -check TSH   Aspiration pneumonitis  broadened to cefepime 8/20; changed back to ceftriaxone 8/21 (cefepime will mess with EEG and prognostication) P: -RLL infiltrate on CXR yesterday, continue Ceftriaxone for 7 days  and follow respiratory cultures   Enlarged Thyroid Seen on CT chest P: -check TSH/T4, thyroid US once acute issues above have resolved  Best Practice (right click and "Reselect all SmartList Selections" daily)   Diet/type: TF DVT prophylaxis: heparin gtt GI prophylaxis: PPI Lines: yes, needed Foley:  Yes, and it is still needed Code Status:  full code Last date of multidisciplinary goals of care discussion: Met with 13 family members on 8/22, discussed possibility of significant hypoxic injury, questions answered, full code and aggressive care   CRITICAL CARE Performed by: Otilio Carpen Paublo Warshawsky   Total critical care time: 50 minutes  Critical care time was exclusive of separately billable procedures and treating other patients.  Critical care was necessary to treat or prevent imminent or life-threatening deterioration.  Critical care was time spent personally by me on the following activities: development of treatment plan with patient and/or  surrogate as well as nursing, discussions with consultants, evaluation of patient's response to treatment, examination of patient, obtaining history from patient or surrogate, ordering and performing treatments and interventions, ordering and review of laboratory studies, ordering and review of radiographic studies, pulse oximetry and re-evaluation of patient's condition.   Otilio Carpen Annaleia Pence, PA-C [facility] See Amion for pager If no response to pager , please call [phone] until 7pm After 7:00 pm call Elink  [phone]?[place]

## 2020-12-05 NOTE — Progress Notes (Signed)
Progress Note  Patient Name: Courtney Davies Date of Encounter: 12/05/2020  [facility] Cardiologist: None   Subjective   Intubated, sedated  Inpatient Medications    Scheduled Meds:  acetaminophen (TYLENOL) oral liquid 160 mg/5 mL  650 mg Per Tube Q6H   amiodarone  200 mg Per Tube BID   Followed by   Derrill Memo ON 12/10/2020] amiodarone  200 mg Per Tube Daily   busPIRone  30 mg Per Tube Q8H   chlorhexidine gluconate (MEDLINE KIT)  15 mL Mouth Rinse BID   Chlorhexidine Gluconate Cloth  6 each Topical Daily   docusate  100 mg Per Tube BID   fentaNYL (SUBLIMAZE) injection  50 mcg Intravenous Once   insulin aspart  0-15 Units Subcutaneous Q4H   mouth rinse  15 mL Mouth Rinse 10 times per day   methylPREDNISolone (SOLU-MEDROL) injection  40 mg Intravenous Daily   pantoprazole sodium  40 mg Per Tube Daily   polyethylene glycol  17 g Per Tube Daily   sodium chloride flush  10-40 mL Intracatheter Q12H   Continuous Infusions:  cefTRIAXone (ROCEPHIN)  IV Stopped (12/04/20 1508)   feeding supplement (VITAL AF 1.2 CAL) 60 mL/hr at 12/04/20 2000   fentaNYL infusion INTRAVENOUS 375 mcg/hr (12/05/20 0806)   heparin 1,500 Units/hr (12/05/20 0600)   lactated ringers 75 mL/hr at 12/05/20 0600   norepinephrine (LEVOPHED) Adult infusion 2 mcg/min (12/05/20 0600)   propofol (DIPRIVAN) infusion 60 mcg/kg/min (12/05/20 0616)   PRN Meds: albuterol, docusate, fentaNYL, ondansetron (ZOFRAN) IV, polyethylene glycol, sodium chloride flush   Vital Signs    Vitals:   12/05/20 0615 12/05/20 0630 12/05/20 0645 12/05/20 0734  BP: 113/70 118/67 128/71   Pulse: (!) 57 (!) 54 (!) 53   Resp: _0 Temp:      TempSrc:      SpO2: 100% 100% 100% 100%  Weight:      Height:        Intake/Output Summary (Last 24 hours) at 12/05/2020 0833 Last data filed at 12/05/2020 0600 Gross per 24 hour  Intake 4409.31 ml  Output 3680 ml  Net 729.31 ml   Last 3 Weights 12/03/2020 12/02/2020 12/01/2020   Weight (lbs) 232 lb 2.3 oz 223 lb 15.8 oz 220 lb 7.4 oz  Weight (kg) 105.3 kg 101.6 kg 100 kg      Telemetry    Sinus rhythm, no arrhythmia - Personally Reviewed    Physical Exam  Intubated, sedated, exam deferred  Current gtts: heparin, propofol, fentanyl, levophed  Labs    High Sensitivity Troponin:   Recent Labs  Lab 12/01/20 0913 12/01/20 1706 12/02/20 0834  TROPONINIHS 65* 5,186* 3,074*      Chemistry Recent Labs  Lab 12/01/20 0913 12/01/20 0918 12/02/20 0450 12/03/20 0516 12/04/20 0431 12/05/20 0426  NA 136   < > 140 138 140 141  K 3.7   < > 3.4* 3.5 4.4 4.5  CL 109   < > 112* 112* 114* 115*  CO2 11*  --  19* 19* 20* 21*  GLUCOSE 368*   < > 140* 134* 137* 192*  BUN 13   < > _1 CREATININE 1.21*   < > 1.23* 1.40* 0.99 0.84  CALCIUM 7.8*  --  8.4* 8.3* 8.6* 8.6*  PROT 5.7*  --  5.9*  --   --   --   ALBUMIN 2.8*  --  3.0*  --   --   --  AST 234*  --  144*  --   --   --   ALT 284*  --  232*  --   --   --   ALKPHOS 110  --  94  --   --   --   BILITOT 0.5  --  0.8  --   --   --   GFRNONAA 57*  --  56* 48* >60 >60  ANIONGAP 16*  --  _0 < > = values in this interval not displayed.     Hematology Recent Labs  Lab 12/03/20 1815 12/04/20 0431 12/05/20 0426  WBC 10.4 11.6* 9.7  RBC 4.76 5.16* 4.80  HGB 10.7* 11.6* 10.7*  HCT 35.2* 38.9 36.1  MCV 73.9* 75.4* 75.2*  MCH 22.5* 22.5* 22.3*  MCHC 30.4 29.8* 29.6*  RDW 16.7* 17.2* 17.4*  PLT 207 233 202    BNP Recent Labs  Lab 12/01/20 0913  BNP 37.2     DDimer  Recent Labs  Lab 12/01/20 0913  DDIMER 4.26*     Radiology    DG Chest 1 View  Result Date: 12/04/2020 CLINICAL DATA:  Cardiac arrest.  Possible pneumonia. EXAM: CHEST  1 VIEW COMPARISON:  12/02/2020 FINDINGS: Endotracheal tube is in place, tip approximately 2.8 centimeters above the carina. Feeding tube is in place, tip beyond the stomach. LEFT IJ central line tip overlies the LOWER superior vena cava/RIGHT  atrium. Heart size is accentuated by technique, probably UPPER limits normal. There is increased opacity at the RIGHT lung base, consistent with developing infiltrate. No evidence for edema. IMPRESSION: Increasing RIGHT LOWER lobe infiltrate. Electronically Signed   By: Nolon Nations M.D.   On: 12/04/2020 12:15   CT HEAD WO CONTRAST (5MM)  Result Date: 12/03/2020 CLINICAL DATA:  Mental status change EXAM: CT HEAD WITHOUT CONTRAST TECHNIQUE: Contiguous axial images were obtained from the base of the skull through the vertex without intravenous contrast. COMPARISON:  12/01/2020 FINDINGS: Brain: No acute infarct or hemorrhage. Lateral ventricles and midline structures are stable. No acute extra-axial fluid collections. No mass effect. Vascular: No hyperdense vessel or unexpected calcification. Skull: Normal. Negative for fracture or focal lesion. Sinuses/Orbits: Mild mucosal thickening within the ethmoid and sphenoid sinuses likely due to intubation and enteric catheter placement. Remaining sinuses are clear. Other: None. IMPRESSION: 1. No acute intracranial process. Electronically Signed   By: Randa Ngo M.D.   On: 12/03/2020 20:20   MR BRAIN WO CONTRAST  Result Date: 12/04/2020 CLINICAL DATA:  Anoxic brain damage EXAM: MRI HEAD WITHOUT CONTRAST TECHNIQUE: Multiplanar, multiecho pulse sequences of the brain and surrounding structures were obtained without intravenous contrast. COMPARISON:  None. FINDINGS: Brain: Areas of cortical reduced diffusion bilaterally. Additional involvement of the hippocampi. No evidence of intracranial hemorrhage. No significant mass effect. No herniation. No hydrocephalus. Vascular: Major vessel flow voids at the skull base are preserved. Skull and upper cervical spine: Normal marrow signal is preserved. Sinuses/Orbits: Paranasal sinuses are aerated. Orbits are unremarkable. Other: Sella is unremarkable.  Mastoid air cells are clear. IMPRESSION: Areas of bilateral cerebral  cortical reduced diffusion. Reduced diffusion along the hippocampi. This is consistent with hypoxic/ischemic injury. Electronically Signed   By: Macy Mis M.D.   On: 12/04/2020 13:43   MR CERVICAL SPINE WO CONTRAST  Result Date: 12/04/2020 CLINICAL DATA:  Anoxic brain damage, cervical trauma EXAM: MRI CERVICAL SPINE WITHOUT CONTRAST TECHNIQUE: Multiplanar, multisequence MR imaging of the cervical spine was performed. No intravenous contrast was administered.  COMPARISON:  Correlation made with CT 12/01/2020 FINDINGS: Alignment: No significant listhesis. Vertebrae: Vertebral body heights are maintained. Mild marrow edema at the C6-C7 endplates probably degenerative. Cord: Normal caliber and signal. Posterior Fossa, vertebral arteries, paraspinal tissues: Unremarkable. Disc levels: C2-C3:  No canal or foraminal stenosis. C3-C4:  Small central protrusion.  No canal or foraminal stenosis. C4-C5: Minimal disc bulge with endplate osteophytes and small central disc protrusion. Mild right facet hypertrophy. No canal or foraminal stenosis. C5-C6: Minimal disc bulge with endplate osteophytes. Mild uncovertebral hypertrophy. No canal or foraminal stenosis. C6-C7: A minimal disc bulge with left central protrusion endplate osteophytes. Mild uncovertebral hypertrophy. Minor canal stenosis. No foraminal stenosis. C7-T1:  No canal or foraminal stenosis. IMPRESSION: No evidence of soft tissue or ligamentous injury. Mild degenerative changes. Electronically Signed   By: Macy Mis M.D.   On: 12/04/2020 13:50    Cardiac Studies   2D echocardiogram 12/01/2020: FINDINGS   Left Ventricle: Left ventricular ejection fraction, by estimation, is 40  to 45%. The left ventricle has mildly decreased function. The left  ventricle demonstrates global hypokinesis. The left ventricular internal  cavity size was normal in size. There is   mild left ventricular hypertrophy. Left ventricular diastolic parameters  are consistent  with Grade I diastolic dysfunction (impaired relaxation).   Right Ventricle: The right ventricular size is normal. No increase in  right ventricular wall thickness. Right ventricular systolic function is  normal. There is normal pulmonary artery systolic pressure. The tricuspid  regurgitant velocity is 2.16 m/s, and   with an assumed right atrial pressure of 3 mmHg, the estimated right  ventricular systolic pressure is 12.2 mmHg.   Left Atrium: Left atrial size was normal in size.   Right Atrium: Right atrial size was normal in size.   Pericardium: There is no evidence of pericardial effusion.   Mitral Valve: The mitral valve is normal in structure. Mild to moderate  mitral valve regurgitation. No evidence of mitral valve stenosis. MV peak  gradient, 3.8 mmHg. The mean mitral valve gradient is 2.0 mmHg.   Tricuspid Valve: The tricuspid valve is normal in structure. Tricuspid  valve regurgitation is trivial.   Aortic Valve: The aortic valve was not well visualized. Aortic valve  regurgitation is not visualized. Mild aortic valve sclerosis is present,  with no evidence of aortic valve stenosis. Aortic valve mean gradient  measures 3.0 mmHg. Aortic valve peak  gradient measures 6.1 mmHg. Aortic valve area, by VTI measures 2.34 cm.   Pulmonic Valve: The pulmonic valve was not well visualized. Pulmonic valve  regurgitation is not visualized.   Aorta: The aortic root is normal in size and structure.   IAS/Shunts: The interatrial septum was not well visualized.   CT angiogram chest 12/01/2020: IMPRESSION: 1. No evidence for acute pulmonary embolus. 2. ET tube tip terminates in the proximal right mainstem bronchus. Recommend withdrawing by at least 2 cm. 3. Bilateral lower lobe airspace consolidation, left greater than right. Findings are likely secondary to aspiration although pneumonia is not excluded. 4. Enlarged and heterogeneous thyroid gland. Recommend thyroid ultrasound  (ref: J Am Coll Radiol. 2015 Feb;12(2): 143-50).  Patient Profile     45 y.o. female with out of hospital ventricular fibrillation cardiac arrest and anoxic brain injury  Assessment & Plan    1.  Ventricular fibrillation cardiac arrest: Details of arrest somewhat unclear whether she had respiratory symptoms precipitating this or whether this was a sudden arrhythmia.  Received multiple defibrillations per chart review.  Telemetry is reviewed and shows no significant arrhythmia over the last 24 hours.  Has been transition from IV to oral amiodarone via core track. 2.  Respiratory failure: Inability to handle secretions secondary to neurologic status, management per CCM 3.  AKA, resolved 4.  Shock, suspect multifactorial: Stable dose of Levophed, LVEF 40 to 45% by echo with no evidence of LV dilatation and no RV dysfunction noted.  Patient's heart rhythm stable.  Cardiology team will follow from a distance.  If she has meaningful neurologic recovery, it would be appropriate to proceed with cardiac catheterization and further cardiac evaluation at that time.      For questions or updates, please contact [place] Please consult [email or web address] for contact info under        Signed, Sherren Mocha, MD  12/05/2020, 8:33 AM

## 2020-12-05 NOTE — Consult Note (Signed)
Consultation Note Date: 12/06/20  Patient Name: Courtney Davies  DOB: Apr 01, 1976  MRN: [id number]  Age / Sex: 45 y.o., female  PCP: Pcp, No Referring Physician: Kipp Brood, MD  Reason for Consultation: Establishing goals of care  HPI/Patient Profile: 45 y.o. female  with past medical history of HTN, DM type 2. B12 def GERD OA admitted on 12/01/2020 with post arrest downtime ~10 minutes, + COVID, NSTEMI, CTA negative PE. Previously had COVID infection ~4 weeks prior to arrest and reportedly recovered and returned to work. Beginning to be more alert and extubated 12/06/20. MRI does show anoxic brain injury.   Clinical Assessment and Goals of Care: I met today at [facility] bedside and she was recently extubated and resting and in no distress. I spoke with PCCM regarding care.   I called and introduced palliative care to mother, Courtney Davies, and our role for support and ensuring family has information needed to make good decisions for Courtney Davies. We discussed current status and expectations moving forward. We discussed MRI results with some anoxic injury and although we are hopeful for continued progress it is difficult to know exactly what this will look like for Courtney Davies in the coming days, weeks, and even months. We discussed that she could have continued health deficits and only time will tell how well she will be able to recover. Courtney Davies expresses understanding but also overwhelmed and asks that I call her daughter, Courtney Davies. Courtney Davies expresses hope for continued recovery and also hope that further work up will reveal cause of initial arrest eventually. I did express to Courtney Davies that this may be possible and we will investigate further as we are able when Courtney Davies is more stable. Courtney Davies is very appreciative of the care her daughter is receiving.   I called and spoke with sister, Courtney Davies. Courtney Davies has very good understanding of her  sister's condition and expectations moving forward. Courtney Davies does share that the family (her parents and [place] 2 sons 49 and 21 yo look to her). She is trying to prepare her family for a long road. Courtney Davies shares that they are a family of faith and very prayerful for continued recovery. Courtney Davies shares that Courtney Davies is all about family (she loves her little niece) and is very Estate agent and funny.   All questions/concerns addressed. Emotional support provided.   Primary Decision Maker NEXT OF KIN (legally shared decision making to parents and adult children)    SUMMARY OF RECOMMENDATIONS   - Plans for continued aggressive care - Family very hopeful for continued improvement  Code Status/Advance Care Planning: Full code   Symptom Management:  Per PCCM  Palliative Prophylaxis:  Aspiration, Bowel Regimen, Delirium Protocol, Frequent Pain Assessment, and Turn Reposition  Additional Recommendations (Limitations, Scope, Preferences): Full Scope Treatment  Prognosis:   Unable to determine. Difficult to say currently. Tenuous with potential for improvement but risk of further decline at the same time. Needs time for outcomes.   Discharge Planning: To Be Determined  Primary Diagnoses: Present on Admission:  Cardiac arrest ([place])  I have reviewed the medical record, interviewed the patient and family, and examined the patient. The following aspects are pertinent.  Past Medical History:  Diagnosis Date   Anemia    Diabetes type 2, controlled ([place])    GERD (gastroesophageal reflux disease)    Hypertension    Social History   Socioeconomic History   Marital status: Single    Spouse name: Not on file   Number of children: Not on file   Years of education: Not on file   Highest education level: Not on file  Occupational History   Not on file  Tobacco Use   Smoking status: Not on file   Smokeless tobacco: Not on file  Substance and Sexual Activity   Alcohol use: Not on file   Drug  use: Not on file   Sexual activity: Not on file  Other Topics Concern   Not on file  Social History Narrative   Not on file   Social Determinants of Health   Financial Resource Strain: Not on file  Food Insecurity: Not on file  Transportation Needs: Not on file  Physical Activity: Not on file  Stress: Not on file  Social Connections: Not on file   Scheduled Meds:  acetaminophen (TYLENOL) oral liquid 160 mg/5 mL  650 mg Per Tube Q6H   amiodarone  200 mg Per Tube BID   Followed by   Derrill Memo ON 12/10/2020] amiodarone  200 mg Per Tube Daily   busPIRone  30 mg Per Tube Q8H   chlorhexidine gluconate (MEDLINE KIT)  15 mL Mouth Rinse BID   Chlorhexidine Gluconate Cloth  6 each Topical Daily   docusate  100 mg Per Tube BID   fentaNYL (SUBLIMAZE) injection  50 mcg Intravenous Once   insulin aspart  0-15 Units Subcutaneous Q4H   mouth rinse  15 mL Mouth Rinse 10 times per day   methylPREDNISolone (SOLU-MEDROL) injection  40 mg Intravenous Daily   pantoprazole sodium  40 mg Per Tube Daily   polyethylene glycol  17 g Per Tube Daily   sodium chloride flush  10-40 mL Intracatheter Q12H   Continuous Infusions:  cefTRIAXone (ROCEPHIN)  IV Stopped (12/04/20 1508)   feeding supplement (VITAL AF 1.2 CAL) 60 mL/hr at 12/04/20 2000   fentaNYL infusion INTRAVENOUS 375 mcg/hr (12/05/20 0806)   heparin 1,500 Units/hr (12/05/20 0600)   lactated ringers 75 mL/hr at 12/05/20 0600   norepinephrine (LEVOPHED) Adult infusion 2 mcg/min (12/05/20 0600)   propofol (DIPRIVAN) infusion 30 mcg/kg/min (12/05/20 0838)   PRN Meds:.albuterol, docusate, fentaNYL, ondansetron (ZOFRAN) IV, polyethylene glycol, sodium chloride flush Medications Prior to Admission:  Prior to Admission medications   Medication Sig Start Date End Date Taking? Authorizing Provider  folic acid (FOLVITE) 1 MG tablet Take 1 mg by mouth daily.    [provider]  HYDROcodone-acetaminophen (NORCO) 7.5-325 MG tablet Take 1 tablet by  mouth 3 (three) times daily as needed for pain. 11/07/20   [provider]  LINZESS 290 MCG CAPS capsule Take 290 mcg by mouth daily. 12/01/20   [provider]  meloxicam (MOBIC) 15 MG tablet Take 15 mg by mouth daily. 06/07/20   [provider]  nirmatrelvir & ritonavir (PAXLOVID) 20 x 150 MG & 10 x 100MG TBPK Take 100-150 mg by mouth See admin instructions. follow package directions 11/10/20   [provider]  pantoprazole (PROTONIX) 40 MG tablet Take 40 mg by mouth 2 (two) times daily. 07/07/20   [provider]  phentermine (ADIPEX-P) 37.5 MG tablet Take 37.5 mg by mouth daily. 07/12/20   [provider]  PROAIR HFA 108 (90 Base) MCG/ACT inhaler Inhale 1-2 puffs into the lungs every 4 (four) hours as needed. 07/25/20   [provider]  tiZANidine (ZANAFLEX) 4 MG tablet Take 4 mg by mouth at bedtime. 09/27/20   [provider]  TRULICITY 4.5 AO/1.3YQ SOPN Inject 4.5 mg into the skin once a week. 10/26/20   [provider]  Vitamin D, Ergocalciferol, (DRISDOL) 1.25 MG (50000 UNIT) CAPS capsule Take 50,000 Units by mouth once a week. 10/26/20   [provider]   Allergies  Allergen Reactions   Other Anaphylaxis and Hives    Hair Glue   Codeine Nausea And Vomiting   Latex Hives   Nitrofurantoin Rash   Review of Systems  Unable to perform ROS: Acuity of condition   Physical Exam Vitals and nursing note reviewed.  Constitutional:      General: She is sleeping. She is not in acute distress.    Appearance: She is ill-appearing.  Cardiovascular:     Rate and Rhythm: Bradycardia present.  Pulmonary:     Effort: No tachypnea, accessory muscle usage or respiratory distress.  Abdominal:     General: Abdomen is flat.    Vital Signs: BP 128/71   Pulse (!) 53   Temp (!) 94.3 F (34.6 C) (Axillary)   Resp 16   Ht 5' 6"  (1.676 m)   Wt 105.3 kg Comment: artic sun pads added since last weight  SpO2 100%   BMI  37.47 kg/m  Pain Scale: CPOT       SpO2: SpO2: 100 % O2 Device:SpO2: 100 % O2 Flow Rate: .O2 Flow Rate (L/min): 4 L/min  IO: Intake/output summary:  Intake/Output Summary (Last 24 hours) at 12/05/2020 6578 Last data filed at 12/05/2020 0830 Gross per 24 hour  Intake 4234.08 ml  Output 3795 ml  Net 439.08 ml    LBM: Last BM Date: (P) 12/05/20 Baseline Weight: Weight: 100 kg Most recent weight: Weight: 105.3 kg (artic sun pads added since last weight)     Palliative Assessment/Data:     I discussed this patient's plan of care with PCCM.  Thank you for this consult. Palliative medicine will continue to follow and assist as needed.   Time Total: 50 min minutes Greater than 50%  of this time was spent counseling and coordinating care related to the above assessment and plan.  Signed by: Jordan Hawks, DNP, FNP-BC Palliative Medicine    Please contact Palliative Medicine Team phone at [phone] for questions and concerns.  For individual provider: See Malachi Paradise, NP Palliative Medicine Team Pager [phone] (Please see [email or web address] for schedule) Team Phone [phone]    Greater than 50%  of this time was spent counseling and coordinating care related to the above assessment and plan

## 2020-12-05 NOTE — Progress Notes (Signed)
RT note-Patient placed back to full support at this time for rest period. While weaning patient has large VT's 800-1.0 L with PS at +3, and maintaining good VE. Now that she is on full support VT and VE appropriate for her PBW with no breath stacking.

## 2020-12-05 NOTE — Progress Notes (Signed)
ANTICOAGULATION CONSULT NOTE   Pharmacy Consult for Heparin Indication: chest pain/ACS   Labs: Recent Labs    12/03/20 0516 12/03/20 1815 12/04/20 0431 12/05/20 0426  HGB 11.4* 10.7* 11.6* 10.7*  HCT 36.5 35.2* 38.9 36.1  PLT 212 207 233 202  HEPARINUNFRC 0.57  --  0.29* 0.53  CREATININE 1.40*  --  0.99 0.84    Assessment: 45yof admitted after VF arrest.  Troponin  elevated will be evaluated for ischemia.  Heparin level at goal (0.53) on 1500 units/hr. Hgb stable today at 10.7. No bleeding noted, will follow closely.   Goal of Therapy:  Heparin level 0.3-0.7 units/ml Monitor platelets by anticoagulation protocol: Yes   Plan:  Continue IV heparin at 1500 units/hr. Daily heparin level and CBC.  Thanks for allowing pharmacy to be a part of this patient's care.  Cathrine Muster, PharmD PGY2 Cardiology Pharmacy Resident Phone: [phone] 12/05/2020  11:10 AM Please check [email or web address] for unit-specific pharmacy phone numbers.

## 2020-12-06 ENCOUNTER — Encounter (HOSPITAL_COMMUNITY): Payer: Self-pay | Admitting: Pulmonary Disease

## 2020-12-06 DIAGNOSIS — I469 Cardiac arrest, cause unspecified: Secondary | ICD-10-CM | POA: Diagnosis not present

## 2020-12-06 DIAGNOSIS — I5021 Acute systolic (congestive) heart failure: Secondary | ICD-10-CM

## 2020-12-06 DIAGNOSIS — Z515 Encounter for palliative care: Secondary | ICD-10-CM

## 2020-12-06 DIAGNOSIS — Z7189 Other specified counseling: Secondary | ICD-10-CM | POA: Diagnosis not present

## 2020-12-06 LAB — CULTURE, RESPIRATORY W GRAM STAIN: Culture: NO GROWTH

## 2020-12-06 LAB — BASIC METABOLIC PANEL
Anion gap: 6 (ref 5–15)
BUN: 17 mg/dL (ref 6–20)
CO2: 22 mmol/L (ref 22–32)
Calcium: 8.7 mg/dL — ABNORMAL LOW (ref 8.9–10.3)
Chloride: 115 mmol/L — ABNORMAL HIGH (ref 98–111)
Creatinine, Ser: 0.92 mg/dL (ref 0.44–1.00)
GFR, Estimated: >60 mL/min (ref >60)
Glucose, Bld: 182 mg/dL — ABNORMAL HIGH (ref 70–99)
Potassium: 3.9 mmol/L (ref 3.5–5.1)
Sodium: 143 mmol/L (ref 135–145)

## 2020-12-06 LAB — GLUCOSE, CAPILLARY
Glucose-Capillary: 157 mg/dL — ABNORMAL HIGH (ref 70–99)
Glucose-Capillary: 228 mg/dL — ABNORMAL HIGH (ref 70–99)
Glucose-Capillary: 159 mg/dL — ABNORMAL HIGH (ref 70–99)
Glucose-Capillary: 255 mg/dL — ABNORMAL HIGH (ref 70–99)
Glucose-Capillary: 196 mg/dL — ABNORMAL HIGH (ref 70–99)

## 2020-12-06 LAB — CBC
HCT: 34.5 % — ABNORMAL LOW (ref 36.0–46.0)
Hemoglobin: 10.4 g/dL — ABNORMAL LOW (ref 12.0–15.0)
MCH: 22.1 pg — ABNORMAL LOW (ref 26.0–34.0)
MCHC: 30.1 g/dL (ref 30.0–36.0)
MCV: 73.4 fL — ABNORMAL LOW (ref 80.0–100.0)
Platelets: 238 10*3/uL (ref 150–400)
RBC: 4.7 MIL/uL (ref 3.87–5.11)
RDW: 17.1 % — ABNORMAL HIGH (ref 11.5–15.5)
WBC: 12.7 10*3/uL — ABNORMAL HIGH (ref 4.0–10.5)
nRBC: 0 % (ref 0.0–0.2)

## 2020-12-06 LAB — CULTURE, BLOOD (ROUTINE X 2)
Culture: NO GROWTH
Culture: NO GROWTH
Special Requests: ADEQUATE

## 2020-12-06 LAB — HEPARIN LEVEL (UNFRACTIONATED): Heparin Unfractionated: 0.34 IU/mL (ref 0.30–0.70)

## 2020-12-06 LAB — TRIGLYCERIDES: Triglycerides: 114 mg/dL (ref <150)

## 2020-12-06 LAB — MAGNESIUM: Magnesium: 1.9 mg/dL (ref 1.7–2.4)

## 2020-12-06 MED ORDER — LABETALOL HCL 5 MG/ML IV SOLN
5.0000 mg | INTRAVENOUS | Status: DC | PRN
  Administered 2020-12-06 – 2020-12-07 (×3): 5 mg via INTRAVENOUS
  Filled 2020-12-06 (×3): qty 4

## 2020-12-06 MED ORDER — LOSARTAN POTASSIUM 25 MG PO TABS
25.0000 mg | ORAL_TABLET | Freq: Every day | ORAL | Status: DC
  Administered 2020-12-06: 25 mg via ORAL
  Filled 2020-12-06 (×2): qty 1

## 2020-12-06 MED ORDER — FUROSEMIDE 10 MG/ML IJ SOLN
40.0000 mg | Freq: Once | INTRAMUSCULAR | Status: AC
  Administered 2020-12-06: 40 mg via INTRAVENOUS
  Filled 2020-12-06: qty 4

## 2020-12-06 MED ORDER — IPRATROPIUM-ALBUTEROL 0.5-2.5 (3) MG/3ML IN SOLN
3.0000 mL | Freq: Two times a day (BID) | RESPIRATORY_TRACT | Status: DC
  Administered 2020-12-06: 3 mL via RESPIRATORY_TRACT
  Filled 2020-12-06: qty 3

## 2020-12-06 MED ORDER — ALBUTEROL SULFATE HFA 108 (90 BASE) MCG/ACT IN AERS
2.0000 | INHALATION_SPRAY | Freq: Two times a day (BID) | RESPIRATORY_TRACT | Status: DC
  Administered 2020-12-06: 2 via RESPIRATORY_TRACT
  Filled 2020-12-06: qty 6.7

## 2020-12-06 MED ORDER — PROPOFOL 1000 MG/100ML IV EMUL
5.0000 ug/kg/min | INTRAVENOUS | Status: DC
  Administered 2020-12-06 (×2): 50 ug/kg/min via INTRAVENOUS
  Filled 2020-12-06 (×2): qty 100

## 2020-12-06 MED ORDER — INSULIN DETEMIR 100 UNIT/ML ~~LOC~~ SOLN
5.0000 [IU] | Freq: Every day | SUBCUTANEOUS | Status: DC
  Administered 2020-12-06: 5 [IU] via SUBCUTANEOUS
  Filled 2020-12-06 (×2): qty 0.05

## 2020-12-06 MED ORDER — ORAL CARE MOUTH RINSE
15.0000 mL | Freq: Two times a day (BID) | OROMUCOSAL | Status: DC
  Administered 2020-12-06: 15 mL via OROMUCOSAL

## 2020-12-06 MED ORDER — FENTANYL CITRATE (PF) 100 MCG/2ML IJ SOLN
50.0000 ug | INTRAMUSCULAR | Status: AC | PRN
  Administered 2020-12-06 – 2020-12-07 (×3): 50 ug via INTRAVENOUS
  Filled 2020-12-06 (×4): qty 2

## 2020-12-06 NOTE — Progress Notes (Signed)
NAME:  Courtney Davies, MRN:  [id number], DOB:  [birth year], LOS: 5 ADMISSION DATE:  12/01/2020, CONSULTATION DATE:  12/01/20 REFERRING MD:  Karle Starch - EM, CHIEF COMPLAINT:  Vfib arrest   History of Present Illness:  45 yo F PMH HTN DM2 B12 def GERD OA presented to [facility] 8/18 as OOH Vfib ~10 min arrest (although may have been much longer, conflicting reports).  COVID +, no STEMI.  CTA neg for PE.   History from family the patient was diagnosed with COVID 4 weeks prior to event, she had recovered fine and was back at work.  She has not had any preceding known chest pain or shortness of breath and patient is generally someone who takes care of herself and will be able seen by a medical provider when she is concerned about her health.  Pertinent  Medical History  B12 deficiency DM GERD Dysphagia  Bronchitis  OA  L knee pain  Significant Hospital Events: Including procedures, antibiotic start and stop dates in addition to other pertinent events   8/18 ED via EMS as Vfib arrest with ROSC, 5 cycles Epi 5x DF and 432m amio. ECG without acute ST segment changes. COVID positive  8/19 sedation hold and extubation, not following commands, started to spike fevers, not protecting airway so reintubated, CVL placed 8/22 Heavily sedated overnight, transition to Precedex, trial SBT/SAT 8/23 successful SBT/SAT, extubated to [place]  Interim History / Subjective:   Off pressors this morning, +3 L since admit, 3 L UOP yesterday No leukocytosis, no renal failure  MRI On 8/21>>Areas of bilateral cerebral cortical reduced diffusion. Reduced diffusion along the hippocampi consistent with hypoxic/ischemic injury.  Objective   Blood pressure (!) 144/83, pulse 72, temperature 98.6 F (37 C), temperature source Axillary, resp. rate 15, height 5' 6"  (1.676 m), weight 107 kg, SpO2 100 %.    Vent Mode: PRVC FiO2 (%):  [30 %-40 %] 30 % Set Rate:  [18 bmp] 18 bmp Vt Set:  [480 mL] 480 mL PEEP:  [5 cmH20] 5  cmH20 Plateau Pressure:  [14 cmH20-17 cmH20] 17 cmH20   Intake/Output Summary (Last 24 hours) at 12/06/2020 06283Last data filed at 12/06/2020 0600 Gross per 24 hour  Intake 2807.89 ml  Output 1825 ml  Net 982.89 ml    Filed Weights   12/02/20 0500 12/03/20 0418 12/06/20 0500  Weight: 101.6 kg 105.3 kg 107 kg     General:  well-nourished F, awake coughing off sedation with ETT in place HEENT: MM pink/moist, sclera anicteric, pupils equal Neuro: opening eyes, purposeful movement with the L hand, tracking, Not moving the L side to command CV: s1s2 tachycardic, regular no m/r/g PULM:  scattered rhonchi, good tidal volumes on PSV, copious secretions GI: soft, bsx4 active  Extremities: warm/dry, no edema  Skin: no rashes or lesions      Resolved Hospital Problem list   Shock liver Shock AKI  Assessment & Plan:    Witnessed out of hospital Vfib arrest with acute respiratory failure in the setting of baseline asthma and recent COVID-19 infection  COVID +, trop leak, nonischemic EKG, question COVID-associated arrhythmia vs. Myocarditis vs. ACS Reported 10 minute down time [facility] Extubated 8/19, reintubated for failure to protect her airway Extubated again 8/23 P: -Doing well-post extubation on RA, monitor mental status closely today, she has significant secretions  -appreciate cardiology input, no significant arrhythmia over the last 24hrs now on oral amiodarone, if she has meaningful  neurologic recovery plan to proceed with cardiac cath and cardiac evaluation -Echo with no LV dilation or RV dysfunction, EF 40 to 45% -Initially required pressors, now off -Family reports she had fully recovered from [place] at the time of this event, CT chest without obvious COVID pneumonitis -continue solumedrol, Baricitinib and Remdesevir were not indicated given already on the vent and shock liver -Hopeful she will progress to extubation, but discussed  with family the possibility of tracheostomy, palliative consulted  -C-spine cleared (initial fall)     Acute Encephalopathy MRI shows areas of bilateral cerebral cortical reduced diffusion. Reduced diffusion along the hippocampi consistent with hypoxic/ischemic injury. P: -improving awake and tracking today, will likely take some time for sedation to wash out and the nature of her hypoxic injury to become clear.  Afib, acute HFrEF, HTN Echo 8/18 with LVEF 40-45% and grade 1 diastolic dysfunction Intermittent, now in sinus P: -continue oral amiodarone -Lasix 20mx1 -continue Cozaar   Aspiration pneumonitis  broadened to cefepime 8/20; changed back to ceftriaxone 8/21 (cefepime will mess with EEG and prognostication) P: -RLL infiltrate on CXR yesterday, continue Ceftriaxone for 7 days  and follow respiratory cultures -CXR improving    Enlarged Thyroid Seen on CT chest P: - thyroid [place] acute issues above have resolved  Best Practice (right click and "Reselect all SmartList Selections" daily)   Diet/type: TF DVT prophylaxis: heparin gtt GI prophylaxis: PPI Lines: yes, needed Foley:  Yes, and it is still needed Code Status:  full code Last date of multidisciplinary goals of care discussion: -Met with 13 family members on 8/22, discussed possibility of significant hypoxic injury, questions answered, full code and aggressive care -Pt's mother updated 8/23  CRITICAL CARE Performed by: LOtilio CarpenGleason   Total critical care time: 40 minutes  Critical care time was exclusive of separately billable procedures and treating other patients.  Critical care was necessary to treat or prevent imminent or life-threatening deterioration.  Critical care was time spent personally by me on the following activities: development of treatment plan with patient and/or surrogate as well as nursing, discussions with consultants, evaluation of patient's response to treatment, examination  of patient, obtaining history from patient or surrogate, ordering and performing treatments and interventions, ordering and review of laboratory studies, ordering and review of radiographic studies, pulse oximetry and re-evaluation of patient's condition.   LOtilio CarpenGleason, PA-C [facility] See Amion for pager If no response to pager , please call [phone] 7pm After 7:00 pm call Elink  [phone]

## 2020-12-06 NOTE — Progress Notes (Signed)
ANTICOAGULATION CONSULT NOTE   Pharmacy Consult for Heparin Indication: chest pain/ACS   Labs: Recent Labs    12/04/20 0431 12/05/20 0426 12/06/20 0435  HGB 11.6* 10.7* 10.4*  HCT 38.9 36.1 34.5*  PLT 233 202 238  HEPARINUNFRC 0.29* 0.53 0.34  CREATININE 0.99 0.84 0.92    Assessment: 45yof admitted after VF arrest.  Troponin  elevated will be evaluated for ischemia.  Heparin level at goal (0.34) on 1500 units/hr. Hgb stable today at 10.4. Bleeding noted overnight from mouth from biting ET tube, will follow closely.   Goal of Therapy:  Heparin level 0.3-0.7 units/ml Monitor platelets by anticoagulation protocol: Yes   Plan:  Continue IV heparin at 1500 units/hr. Daily heparin level and CBC.  Thanks for allowing pharmacy to be a part of this patient's care.  Cathrine Muster, PharmD PGY2 Cardiology Pharmacy Resident Phone: [phone] 12/06/2020  8:53 AM Please check [email or web address] for unit-specific pharmacy phone numbers.

## 2020-12-06 NOTE — Progress Notes (Signed)
PT Cancellation Note  Patient Details Name: Courtney Davies MRN: [id number] DOB: 1976-01-06   Cancelled Treatment:    Reason Eval/Treat Not Completed: Patient not medically ready (pt just extubated and not following commands)   Lilit Cinelli B Kary Sugrue 12/06/2020, 9:15 AM Bayard Males, PT Acute Rehabilitation Services Pager: [phone] Office: [phone]

## 2020-12-06 NOTE — Procedures (Signed)
Extubation Procedure Note  Patient Details:   Name: Courtney Davies DOB: 1976/02/12 MRN: [id number]   Airway Documentation:    Vent end date: 12/06/20 Vent end time: 0908   Evaluation  O2 sats: stable throughout Complications: No apparent complications Patient did tolerate procedure well. BBS scattered Rhonchi  Placed on 10L [place]    No, non verbal at this time  Revonda Standard 12/06/2020, 9:09 AM

## 2020-12-06 NOTE — Progress Notes (Signed)
SLP Cancellation Note  Patient Details Name: Courtney Davies MRN: [id number] DOB: April 18, 1975   Cancelled treatment:       Reason Eval/Treat Not Completed: Medical issues which prohibited therapy. Pt remains on vent. Will continue to follow.    Osie Bond., M.A. [place] [facility] Pager [phone] Office [phone]  12/06/2020, 7:48 AM

## 2020-12-06 NOTE — Evaluation (Signed)
Occupational Therapy Evaluation Patient Details Name: Courtney Davies MRN: [id number] DOB: 02/11/1976 Today's Date: 12/06/2020    History of Present Illness Pt is a 45 y/o female admitted 8/18 with Vfib arrest with ROSC. Covid. CTA negative for PE. MRI 8/21 consistent with hypoxic/ischemic injury. 8/21 RLL infiltrate seen on CXR from aspiration pna. Intubated 8/18-23 (failed inital attempt to extubate 8/19). PMh includes: B12 deficiency, DM, OA, bronchitis, dysphagia.   Clinical Impression   Patient admitted for above and limited by problem list below, including impaired cognition, decreased activity tolerance, weakness. Patient supine in bed, alert but not following commands.  She has a L gaze preference, but able to scan towards R side with max cueing and track items but loosing attention and not fully reaching R side.  PROM is WFL on BUEs but fluctuating tone noted, automatic grasp bilaterally with no purposeful movement. She requires total assist for self care and mobility in bed.  VSS on 4L [place] during session.  Will follow acutely to optimize independence, safety with ADLs and mobility.     Follow Up Recommendations  SNF;Supervision/Assistance - 24 hour    Equipment Recommendations  Other (comment) (TBD)    Recommendations for Other Services       Precautions / Restrictions Precautions Precautions: Fall Restrictions Weight Bearing Restrictions: No      Mobility Bed Mobility Overal bed mobility: Needs Assistance Bed Mobility: Rolling Rolling: Total assist;+2 for safety/equipment;+2 for physical assistance         General bed mobility comments: total assist +2 to roll and reposition in bed, chair egress into sitting with good tolerance to upright position    Transfers                 General transfer comment: deferred    Balance                                           ADL either performed or assessed with clinical judgement   ADL Overall  ADL's : Needs assistance/impaired                                     Functional mobility during ADLs: Total assistance;+2 for physical assistance;+2 for safety/equipment General ADL Comments: total assist for all self care     Vision   Vision Assessment?: Vision impaired- to be further tested in functional context Additional Comments: L gaze preference but able to scan to R side with maximal cueing; tracks items approx 3/4 towards R side then eyes jump back to L; further assessment required     Perception     Praxis      Pertinent Vitals/Pain Pain Assessment: Faces Faces Pain Scale: Hurts a little bit Pain Location: generalized Pain Descriptors / Indicators: Discomfort;Grimacing Pain Intervention(s): Monitored during session;Repositioned     Hand Dominance     Extremity/Trunk Assessment Upper Extremity Assessment Upper Extremity Assessment: RUE deficits/detail;LUE deficits/detail RUE Deficits / Details: limited assessment due to cognition; PROM WFL with fluctuating tone and automatic grasp RUE Coordination: decreased gross motor;decreased fine motor LUE Deficits / Details: limited assessment due to cognition, PROM WFL with fluctuating tone and automatic grasp LUE Coordination: decreased fine motor;decreased gross motor   Lower Extremity Assessment Lower Extremity Assessment: Defer to PT evaluation       Communication  Communication Communication: Receptive difficulties;Expressive difficulties   Cognition Arousal/Alertness: Awake/alert Behavior During Therapy: Flat affect Overall Cognitive Status: Difficult to assess Area of Impairment: Attention                   Current Attention Level: Focused   Following Commands: Follows one step commands inconsistently       General Comments: pt not following commands, attending to therapist in room with preference to L gaze and decreased R sided attention   General Comments  VSS on 4L [place] during  session    Exercises Exercises: Other exercises Total Joint Exercises Knee Flexion: PROM;Both;10 reps;Seated General Exercises - Lower Extremity Hip Flexion/Marching: PROM;Both;10 reps;Seated Other Exercises Other Exercises: PROM Ankle dorsiflexion x10 seated position in bed Other Exercises: PROM to BUEs, repositoined on pillows   Shoulder Instructions      Home Living Family/patient expects to be discharged to:: Unsure                                        Prior Functioning/Environment Level of Independence: Independent        Comments: anticipate independent, per chart working        OT Problem List: Decreased strength;Decreased range of motion;Decreased activity tolerance;Impaired balance (sitting and/or standing);Impaired vision/perception;Decreased coordination;Decreased cognition;Decreased safety awareness;Decreased knowledge of use of DME or AE;Decreased knowledge of precautions;Cardiopulmonary status limiting activity;Obesity;Impaired tone;Impaired UE functional use;Pain      OT Treatment/Interventions: Therapeutic exercise;Self-care/ADL training;Neuromuscular education;DME and/or AE instruction;Therapeutic activities;Cognitive remediation/compensation;Visual/perceptual remediation/compensation;Patient/family education;Balance training;Manual therapy    OT Goals(Current goals can be found in the care plan section) Acute Rehab OT Goals Patient Stated Goal: none stated OT Goal Formulation: Patient unable to participate in goal setting Time For Goal Achievement: 12/20/20 Potential to Achieve Goals: Fair  OT Frequency: Min 3X/week   Barriers to D/C:            Co-evaluation PT/OT/SLP Co-Evaluation/Treatment: Yes Reason for Co-Treatment: Complexity of the patient's impairments (multi-system involvement);For patient/therapist safety;Necessary to address cognition/behavior during functional activity;To address functional/ADL transfers PT goals  addressed during session: Mobility/safety with mobility OT goals addressed during session: ADL's and self-care      AM-PAC OT "6 Clicks" Daily Activity     Outcome Measure Help from another person eating meals?: Total Help from another person taking care of personal grooming?: Total Help from another person toileting, which includes using toliet, bedpan, or urinal?: Total Help from another person bathing (including washing, rinsing, drying)?: Total Help from another person to put on and taking off regular upper body clothing?: Total Help from another person to put on and taking off regular lower body clothing?: Total 6 Click Score: 6   End of Session Equipment Utilized During Treatment: Oxygen Nurse Communication: Mobility status;Precautions  Activity Tolerance: Patient tolerated treatment well Patient left: in bed;with call bell/phone within reach;with bed alarm set;with SCD's reapplied;with restraints reapplied  OT Visit Diagnosis: Other abnormalities of gait and mobility (R26.89);Muscle weakness (generalized) (M62.81);Other symptoms and signs involving the nervous system (R29.898);Other symptoms and signs involving cognitive function                Time: 1140-1208 OT Time Calculation (min): 28 min Charges:  OT General Charges $OT Visit: 1 Visit OT Evaluation $OT Eval High Complexity: 1 High  Jolaine Artist, OT Acute Rehabilitation Services Pager [phone] Office [phone]   Delight Stare 12/06/2020, 12:28 PM

## 2020-12-06 NOTE — Progress Notes (Signed)
[facility] Progress Note Patient Name: JARELYN BAMBACH DOB: 1975-05-12 MRN: [id number]   Date of Service  12/06/2020  HPI/Events of Note  Camera: Bit tongue, agitation, waking up. No sz activity, no posturing or clonus. Hypertensive. Precedex maxed out. Brief neuro exam done. No vertical nystagmus or rolling. Moves lower ext on pain stimulation -by pressing big toes upwards. In synchrony with Vent. Sats fine.  Neurology on board.  eICU Interventions  - wean down precedex. Start Propofol gtt. On amiodarone, watch for bradycardia Fenta 50 q2 hr prn for pain.  Watch for any sz activity.      Intervention Category Intermediate Interventions: Hypertension - evaluation and management Minor Interventions: Agitation / anxiety - evaluation and management  Elmer Sow 12/06/2020, 2:52 AM

## 2020-12-06 NOTE — Plan of Care (Signed)
Neurology Progress Note   Patient ID: Courtney Davies is a 45 y.o. with PMHx of  has a past medical history of Anemia, Diabetes type 2, controlled ([place]), GERD (gastroesophageal reflux disease), and Hypertension, asthma, smoking, s/p Vfib arrest 8/18, COVID(+), back to work at time of arrest, and otherwise asymptomatic per prior notes. Extubated 8/18 at noon, opened eyes to voice, not following commands, became febrile with worsening secretion and not clearing airways, reintubated at 5 PM. 8/20 felt to have decorticate posturing, started on levophed and cefepime, HCT negative.   Initially consulted for: Encephalopathy  Subjective and major interval events: -Extubated today, on review of chart has been working with PT and OT has a left gaze preference but able to his scan towards the right though she does not fully bury.  Poor attention and requiring total assist for self-care and mobility.  Pertinent workup to date:  Afib on monitor 8/19, started on amio, back in NSR Troponin peaked at [facility] 12/02/20  1. Left ventricular ejection fraction, by estimation, is 40 to 45%. The  left ventricle has mildly decreased function. The left ventricle  demonstrates global hypokinesis. There is mild left ventricular  hypertrophy. Left ventricular diastolic parameters  are consistent with Grade I diastolic dysfunction (impaired relaxation).   2. Right ventricular systolic function is normal. The right ventricular  size is normal. There is normal pulmonary artery systolic pressure.   3. The mitral valve is normal in structure. Mild to moderate mitral valve  regurgitation. No evidence of mitral stenosis.   4. The aortic valve was not well visualized. Aortic valve regurgitation  is not visualized. Mild aortic valve sclerosis is present, with no  evidence of aortic valve stenosis.  EEG 8/19 Description: EEG showed continuous generalized polymorphic sharply contoured 5 to 6 Hz theta slowing. Hyperventilation and  photic stimulation were not performed.    ABNORMALITY - Continuous slow, generalized IMPRESSION: This study is suggestive of moderate diffuse encephalopathy, nonspecific etiology but could be related to sedation. No seizures or epileptiform discharges were seen throughout the recording. MRI brain 8/21 personally reviewed, agree with radiology:  Areas of bilateral cerebral cortical reduced diffusion. Reduced diffusion along the hippocampi. This is consistent with hypoxic/ischemic injury. MRI C-spine 8/21 personally reviewed, agree with radiology, no acute process   Assessment: Evidence of some anoxic brain injury on MRI, expect gradual recovery over time and difficult to prognosticate on the exact extent of recovery.  Appreciate physical therapy and Occupational Therapy involvement to maximize patient's recovery  Impression: V. fib arrest complicated by anoxic brain injury,  COVID-19 infection, on steroids for possible asthma flare  Recommendations: -Continue PT/OT -Neurology will be available on an as-needed basis going forward  Radium [phone]   Patient not seen today but discussed with CCM team and chart reviewed as detailed above.  No charge note

## 2020-12-06 NOTE — Evaluation (Signed)
Physical Therapy Evaluation Patient Details Name: Courtney Davies MRN: [id number] DOB: 1976-02-16 Today's Date: 12/06/2020   History of Present Illness  Pt is a 45 y/o female admitted 8/18 with Vfib arrest with ROSC. Covid. CTA negative for PE. MRI 8/21 consistent with hypoxic/ischemic injury. 8/21 RLL infiltrate seen on CXR from aspiration pna. Intubated 8/18-23 (failed initial attempt to extubate 8/19). PMh includes: B12 deficiency, DM, OA, bronchitis, dysphagia.  Clinical Impression  Pt alert with SpO2 100% on 3L during session s/p extubation this am. Pt with strong bil grip strength with increased tone bil UE. No AROM throughout session noted with jaw clenched and Left gaze preference with right inattention but pt noted to have visual tracking at times grossly 50% during session. Pt not following commands and total +2 for all mobility with pt tolerating mobility and repositioning into chair position. Pt with decreased cognition, strength, transfers and function who will benefit from acute therapy to maximize mobility, safety and function and decrease burden of care.      Follow Up Recommendations SNF;Supervision/Assistance - 24 hour    Equipment Recommendations  Wheelchair (measurements PT);Wheelchair cushion (measurements PT);Hospital bed;Other (comment) (hoyer lift)    Recommendations for Other Services       Precautions / Restrictions Precautions Precautions: Fall Precaution Comments: flexiseal, cortrak Required Braces or Orthoses: Other Brace Other Brace: bil prevalon at rest Restrictions Weight Bearing Restrictions: No      Mobility  Bed Mobility Overal bed mobility: Needs Assistance Bed Mobility: Rolling Rolling: Total assist;+2 for safety/equipment;+2 for physical assistance         General bed mobility comments: total assist +2 to roll and reposition in bed, chair egress into sitting with good tolerance to upright position    Transfers                  General transfer comment: unable  Ambulation/Gait                Stairs            Wheelchair Mobility    Modified Rankin (Stroke Patients Only) Modified Rankin (Stroke Patients Only) Pre-Morbid Rankin Score: No symptoms Modified Rankin: Severe disability     Balance Overall balance assessment: Needs assistance   Sitting balance-Leahy Scale: Zero Sitting balance - Comments: pt with left lean in semi sitting position and no correction                                     Pertinent Vitals/Pain Pain Assessment: Faces Faces Pain Scale: Hurts a little bit Pain Location: generalized Pain Descriptors / Indicators: Discomfort;Grimacing Pain Intervention(s): Monitored during session;Repositioned    Home Living Family/patient expects to be discharged to:: Unsure Living Arrangements: Children                    Prior Function Level of Independence: Independent         Comments: anticipate independent, per chart working, pt unable and no family present     Hand Dominance        Extremity/Trunk Assessment   Upper Extremity Assessment Upper Extremity Assessment: Defer to OT evaluation RUE Deficits / Details: limited assessment due to cognition; PROM WFL with fluctuating tone and automatic grasp/non purposeful movements RUE Coordination: decreased gross motor;decreased fine motor LUE Deficits / Details: limited assessment due to cognition, PROM WFL with fluctuating tone and automatic grasp, non purposeful movements LUE  Coordination: decreased fine motor;decreased gross motor    Lower Extremity Assessment Lower Extremity Assessment: RLE deficits/detail;LLE deficits/detail RLE Deficits / Details: pt with automatic movement of slight bounce of LLE in bed . pt with hip and knee flexion with noxious stimuli to feet. PROM WFL LLE Deficits / Details: pt with hip and knee flexion with noxious stimuli to feet. PROM WFL    Cervical / Trunk  Assessment Cervical / Trunk Assessment: Other exceptions Cervical / Trunk Exceptions: pt with neck extension with increased tone and left lean  Communication   Communication: Receptive difficulties;Expressive difficulties  Cognition Arousal/Alertness: Awake/alert Behavior During Therapy: Flat affect Overall Cognitive Status: Difficult to assess Area of Impairment: Attention                   Current Attention Level: Focused        General Comments: pt not following commands, attending to therapist in room with preference to L gaze and decreased R sided attention. Pt will visually track at times but no noted command following      General Comments General comments (skin integrity, edema, etc.): VSS on 4L [place] during session    Exercises  General Exercises - Lower Extremity Long Arc Quad: PROM;Both;Seated;10 reps Hip Flexion/Marching: PROM;Both;10 reps;Seated   Assessment/Plan    PT Assessment Patient needs continued PT services  PT Problem List Decreased strength;Decreased mobility;Decreased safety awareness;Decreased range of motion;Impaired tone;Decreased coordination;Decreased activity tolerance;Decreased cognition;Decreased balance;Decreased knowledge of use of DME;Impaired sensation       PT Treatment Interventions Therapeutic exercise;Balance training;Functional mobility training;Therapeutic activities;Patient/family education;Neuromuscular re-education    PT Goals (Current goals can be found in the Care Plan section)  Acute Rehab PT Goals Patient Stated Goal: none stated PT Goal Formulation: Patient unable to participate in goal setting Time For Goal Achievement: 12/20/20 Potential to Achieve Goals: Fair    Frequency Min 2X/week   Barriers to discharge Decreased caregiver support      Co-evaluation PT/OT/SLP Co-Evaluation/Treatment: Yes Reason for Co-Treatment: Complexity of the patient's impairments (multi-system involvement);For patient/therapist  safety PT goals addressed during session: Mobility/safety with mobility;Strengthening/ROM OT goals addressed during session: ADL's and self-care       AM-PAC PT "6 Clicks" Mobility  Outcome Measure Help needed turning from your back to your side while in a flat bed without using bedrails?: Total Help needed moving from lying on your back to sitting on the side of a flat bed without using bedrails?: Total Help needed moving to and from a bed to a chair (including a wheelchair)?: Total Help needed standing up from a chair using your arms (e.g., wheelchair or bedside chair)?: Total Help needed to walk in hospital room?: Total Help needed climbing 3-5 steps with a railing? : Total 6 Click Score: 6    End of Session   Activity Tolerance: Patient tolerated treatment well Patient left: in bed;with call bell/phone within reach;with restraints reapplied (bil mittens) Nurse Communication: Mobility status PT Visit Diagnosis: Other abnormalities of gait and mobility (R26.89);Other symptoms and signs involving the nervous system (R29.898);Muscle weakness (generalized) (M62.81)    Time: 3810-1751 PT Time Calculation (min) (ACUTE ONLY): 28 min   Charges:   PT Evaluation $PT Eval High Complexity: 1 High          Ludia Gartland P, PT Acute Rehabilitation Services Pager: [phone] Office: [phone]   Arnie Maiolo B Shonique Pelphrey 12/06/2020, 1:50 PM

## 2020-12-06 NOTE — Progress Notes (Signed)
Progress Note  Patient Name: Courtney Davies Date of Encounter: 12/06/2020  [facility] Cardiologist: None   Subjective   Intubated, sedated  Inpatient Medications    Scheduled Meds:  acetaminophen (TYLENOL) oral liquid 160 mg/5 mL  650 mg Per Tube Q6H   amiodarone  200 mg Per Tube BID   Followed by   Derrill Memo ON 12/10/2020] amiodarone  200 mg Per Tube Daily   busPIRone  30 mg Per Tube Q8H   chlorhexidine gluconate (MEDLINE KIT)  15 mL Mouth Rinse BID   Chlorhexidine Gluconate Cloth  6 each Topical Daily   docusate  100 mg Per Tube BID   fentaNYL (SUBLIMAZE) injection  50 mcg Intravenous Once   insulin aspart  0-20 Units Subcutaneous Q4H   insulin detemir  5 Units Subcutaneous Daily   ipratropium-albuterol  3 mL Nebulization BID   losartan  25 mg Oral Daily   mouth rinse  15 mL Mouth Rinse 10 times per day   methylPREDNISolone (SOLU-MEDROL) injection  40 mg Intravenous Daily   pantoprazole sodium  40 mg Per Tube Daily   polyethylene glycol  17 g Per Tube Daily   sodium chloride flush  10-40 mL Intracatheter Q12H   Continuous Infusions:  cefTRIAXone (ROCEPHIN)  IV Stopped (12/05/20 1135)   feeding supplement (VITAL AF 1.2 CAL) 60 mL/hr at 12/06/20 0600   fentaNYL infusion INTRAVENOUS Stopped (12/05/20 0920)   heparin 1,500 Units/hr (12/06/20 0600)   lactated ringers Stopped (12/05/20 1029)   norepinephrine (LEVOPHED) Adult infusion Stopped (12/05/20 0917)   PRN Meds: albuterol, docusate, fentaNYL, fentaNYL (SUBLIMAZE) injection, ondansetron (ZOFRAN) IV, polyethylene glycol, sodium chloride flush   Vital Signs    Vitals:   12/06/20 0400 12/06/20 0500 12/06/20 0600 12/06/20 0909  BP:   (!) 144/83   Pulse: 74 73 72   Resp: (!) _0 Temp:      TempSrc:      SpO2: 99% 100% 100% 100%  Weight:  107 kg    Height:        Intake/Output Summary (Last 24 hours) at 12/06/2020 0929 Last data filed at 12/06/2020 0600 Gross per 24 hour  Intake 2807.89 ml  Output  1825 ml  Net 982.89 ml   Last 3 Weights 12/06/2020 12/03/2020 12/02/2020  Weight (lbs) 235 lb 14.3 oz 232 lb 2.3 oz 223 lb 15.8 oz  Weight (kg) 107 kg 105.3 kg 101.6 kg      Telemetry    NSR, episodes of sinus brady, no ventricular arrhythmia - Personally Reviewed   Labs    High Sensitivity Troponin:   Recent Labs  Lab 12/01/20 0913 12/01/20 1706 12/02/20 0834  TROPONINIHS 65* 5,186* 3,074*      Chemistry Recent Labs  Lab 12/01/20 0913 12/01/20 0918 12/02/20 0450 12/03/20 0516 12/04/20 0431 12/05/20 0426 12/06/20 0435  NA 136   < > 140   < > 140 141 143  K 3.7   < > 3.4*   < > 4.4 4.5 3.9  CL 109   < > 112*   < > 114* 115* 115*  CO2 11*  --  19*   < > 20* 21* 22  GLUCOSE 368*   < > 140*   < > 137* 192* 182*  BUN 13   < > 16   < > _1 CREATININE 1.21*   < > 1.23*   < > 0.99 0.84 0.92  CALCIUM 7.8*  --  8.4*   < >  8.6* 8.6* 8.7*  PROT 5.7*  --  5.9*  --   --   --   --   ALBUMIN 2.8*  --  3.0*  --   --   --   --   AST 234*  --  144*  --   --   --   --   ALT 284*  --  232*  --   --   --   --   ALKPHOS 110  --  94  --   --   --   --   BILITOT 0.5  --  0.8  --   --   --   --   GFRNONAA 57*  --  56*   < > >60 >60 >60  ANIONGAP 16*  --  9   < > _0 < > = values in this interval not displayed.     Hematology Recent Labs  Lab 12/04/20 0431 12/05/20 0426 12/06/20 0435  WBC 11.6* 9.7 12.7*  RBC 5.16* 4.80 4.70  HGB 11.6* 10.7* 10.4*  HCT 38.9 36.1 34.5*  MCV 75.4* 75.2* 73.4*  MCH 22.5* 22.3* 22.1*  MCHC 29.8* 29.6* 30.1  RDW 17.2* 17.4* 17.1*  PLT 233 202 238    BNP Recent Labs  Lab 12/01/20 0913  BNP 37.2     DDimer  Recent Labs  Lab 12/01/20 0913  DDIMER 4.26*     Radiology    MR BRAIN WO CONTRAST  Result Date: 12/04/2020 CLINICAL DATA:  Anoxic brain damage EXAM: MRI HEAD WITHOUT CONTRAST TECHNIQUE: Multiplanar, multiecho pulse sequences of the brain and surrounding structures were obtained without intravenous contrast. COMPARISON:   None. FINDINGS: Brain: Areas of cortical reduced diffusion bilaterally. Additional involvement of the hippocampi. No evidence of intracranial hemorrhage. No significant mass effect. No herniation. No hydrocephalus. Vascular: Major vessel flow voids at the skull base are preserved. Skull and upper cervical spine: Normal marrow signal is preserved. Sinuses/Orbits: Paranasal sinuses are aerated. Orbits are unremarkable. Other: Sella is unremarkable.  Mastoid air cells are clear. IMPRESSION: Areas of bilateral cerebral cortical reduced diffusion. Reduced diffusion along the hippocampi. This is consistent with hypoxic/ischemic injury. Electronically Signed   By: Macy Mis M.D.   On: 12/04/2020 13:43   MR CERVICAL SPINE WO CONTRAST  Result Date: 12/04/2020 CLINICAL DATA:  Anoxic brain damage, cervical trauma EXAM: MRI CERVICAL SPINE WITHOUT CONTRAST TECHNIQUE: Multiplanar, multisequence MR imaging of the cervical spine was performed. No intravenous contrast was administered. COMPARISON:  Correlation made with CT 12/01/2020 FINDINGS: Alignment: No significant listhesis. Vertebrae: Vertebral body heights are maintained. Mild marrow edema at the C6-C7 endplates probably degenerative. Cord: Normal caliber and signal. Posterior Fossa, vertebral arteries, paraspinal tissues: Unremarkable. Disc levels: C2-C3:  No canal or foraminal stenosis. C3-C4:  Small central protrusion.  No canal or foraminal stenosis. C4-C5: Minimal disc bulge with endplate osteophytes and small central disc protrusion. Mild right facet hypertrophy. No canal or foraminal stenosis. C5-C6: Minimal disc bulge with endplate osteophytes. Mild uncovertebral hypertrophy. No canal or foraminal stenosis. C6-C7: A minimal disc bulge with left central protrusion endplate osteophytes. Mild uncovertebral hypertrophy. Minor canal stenosis. No foraminal stenosis. C7-T1:  No canal or foraminal stenosis. IMPRESSION: No evidence of soft tissue or ligamentous  injury. Mild degenerative changes. Electronically Signed   By: Macy Mis M.D.   On: 12/04/2020 13:50   DG CHEST PORT 1 VIEW  Result Date: 12/05/2020 CLINICAL DATA:  Cardiac arrest.  ET tube EXAM: PORTABLE  CHEST 1 VIEW COMPARISON:  12/05/2019 FINDINGS: Endotracheal tube, left central line, feeding tube remain in place, unchanged. Mild cardiomegaly, vascular congestion. Old improving infiltrate at the right lung base. No confluent airspace opacities or effusions or overt edema. IMPRESSION: Support devices stable. Improving right basilar infiltrate. Borderline cardiomegaly, vascular congestion. Electronically Signed   By: Rolm Baptise M.D.   On: 12/05/2020 16:30     Patient Profile     45 y.o. female with out of hospital ventricular fibrillation cardiac arrest and anoxic brain injury  Assessment & Plan    1.  Ventricular fibrillation cardiac arrest: Remains on oral amiodarone with stable heart rhythm.  Anticipate cardiac work-up pending neurologic recovery.  Patient has a long way to go, but has had some signs of improvement intermittently.  Management per CCM and neurology teams.  Patient has remained on IV heparin.  Anticipate changing to DVT prophylaxis dose tomorrow. 2.  Respiratory failure: Inability to handle secretions.  Management per CCM. 3.  Acute kidney injury, resolved 4.  Hypertension: Discussed with pharmacy team.  Add losartan 25 mg daily.     For questions or updates, please contact [place] Please consult [email or web address] for contact info under        Signed, Sherren Mocha, MD  12/06/2020, 9:29 AM

## 2020-12-07 ENCOUNTER — Inpatient Hospital Stay (HOSPITAL_COMMUNITY): Payer: Medicaid Other

## 2020-12-07 DIAGNOSIS — I469 Cardiac arrest, cause unspecified: Secondary | ICD-10-CM | POA: Diagnosis not present

## 2020-12-07 LAB — BASIC METABOLIC PANEL
Anion gap: 13 (ref 5–15)
BUN: 20 mg/dL (ref 6–20)
CO2: 25 mmol/L (ref 22–32)
Calcium: 9.4 mg/dL (ref 8.9–10.3)
Chloride: 109 mmol/L (ref 98–111)
Creatinine, Ser: 0.93 mg/dL (ref 0.44–1.00)
GFR, Estimated: >60 mL/min (ref >60)
Glucose, Bld: 179 mg/dL — ABNORMAL HIGH (ref 70–99)
Potassium: 3.6 mmol/L (ref 3.5–5.1)
Sodium: 147 mmol/L — ABNORMAL HIGH (ref 135–145)

## 2020-12-07 LAB — HEPARIN LEVEL (UNFRACTIONATED): Heparin Unfractionated: 0.27 IU/mL — ABNORMAL LOW (ref 0.30–0.70)

## 2020-12-07 LAB — GLUCOSE, CAPILLARY
Glucose-Capillary: 134 mg/dL — ABNORMAL HIGH (ref 70–99)
Glucose-Capillary: 155 mg/dL — ABNORMAL HIGH (ref 70–99)
Glucose-Capillary: 171 mg/dL — ABNORMAL HIGH (ref 70–99)
Glucose-Capillary: 186 mg/dL — ABNORMAL HIGH (ref 70–99)
Glucose-Capillary: 193 mg/dL — ABNORMAL HIGH (ref 70–99)
Glucose-Capillary: 198 mg/dL — ABNORMAL HIGH (ref 70–99)

## 2020-12-07 LAB — CBC
HCT: 34 % — ABNORMAL LOW (ref 36.0–46.0)
Hemoglobin: 10.4 g/dL — ABNORMAL LOW (ref 12.0–15.0)
MCH: 22.1 pg — ABNORMAL LOW (ref 26.0–34.0)
MCHC: 30.6 g/dL (ref 30.0–36.0)
MCV: 72.2 fL — ABNORMAL LOW (ref 80.0–100.0)
Platelets: 262 10*3/uL (ref 150–400)
RBC: 4.71 MIL/uL (ref 3.87–5.11)
RDW: 16.1 % — ABNORMAL HIGH (ref 11.5–15.5)
WBC: 13.5 10*3/uL — ABNORMAL HIGH (ref 4.0–10.5)
nRBC: 0 % (ref 0.0–0.2)

## 2020-12-07 LAB — TRIGLYCERIDES: Triglycerides: 151 mg/dL — ABNORMAL HIGH (ref <150)

## 2020-12-07 LAB — MAGNESIUM: Magnesium: 1.8 mg/dL (ref 1.7–2.4)

## 2020-12-07 MED ORDER — CHLORHEXIDINE GLUCONATE CLOTH 2 % EX PADS
6.0000 | MEDICATED_PAD | Freq: Every day | CUTANEOUS | Status: DC
Start: 2020-12-07 — End: 2020-12-10
  Administered 2020-12-07 – 2020-12-08 (×2): 6 via TOPICAL

## 2020-12-07 MED ORDER — ENOXAPARIN SODIUM 100 MG/ML IJ SOSY
100.0000 mg | PREFILLED_SYRINGE | Freq: Two times a day (BID) | INTRAMUSCULAR | Status: DC
  Administered 2020-12-07 – 2020-12-11 (×10): 100 mg via SUBCUTANEOUS
  Filled 2020-12-07 (×12): qty 1

## 2020-12-07 MED ORDER — PROSOURCE TF PO LIQD
45.0000 mL | Freq: Two times a day (BID) | ORAL | Status: DC
  Administered 2020-12-07 – 2020-12-12 (×12): 45 mL
  Filled 2020-12-07 (×14): qty 45

## 2020-12-07 MED ORDER — LABETALOL HCL 5 MG/ML IV SOLN
10.0000 mg | INTRAVENOUS | Status: DC | PRN
  Administered 2020-12-07: 10 mg via INTRAVENOUS
  Filled 2020-12-07: qty 4

## 2020-12-07 MED ORDER — VITAL 1.5 CAL PO LIQD
1000.0000 mL | ORAL | Status: DC
  Administered 2020-12-07 – 2020-12-12 (×4): 1000 mL
  Filled 2020-12-07 (×7): qty 1000

## 2020-12-07 MED ORDER — POTASSIUM CHLORIDE 20 MEQ PO PACK
40.0000 meq | PACK | Freq: Once | ORAL | Status: AC
  Administered 2020-12-07: 40 meq
  Filled 2020-12-07: qty 2

## 2020-12-07 MED ORDER — MAGNESIUM SULFATE 2 GM/50ML IV SOLN
2.0000 g | Freq: Once | INTRAVENOUS | Status: AC
  Administered 2020-12-07: 2 g via INTRAVENOUS
  Filled 2020-12-07: qty 50

## 2020-12-07 MED ORDER — CARVEDILOL 3.125 MG PO TABS
3.1250 mg | ORAL_TABLET | Freq: Two times a day (BID) | ORAL | Status: DC
  Administered 2020-12-07 – 2020-12-08 (×2): 3.125 mg via ORAL
  Filled 2020-12-07 (×2): qty 1

## 2020-12-07 MED ORDER — ALBUTEROL SULFATE HFA 108 (90 BASE) MCG/ACT IN AERS
2.0000 | INHALATION_SPRAY | RESPIRATORY_TRACT | Status: DC | PRN
  Administered 2020-12-07: 2 via RESPIRATORY_TRACT
  Filled 2020-12-07: qty 6.7

## 2020-12-07 MED ORDER — FREE WATER
200.0000 mL | Status: DC
  Administered 2020-12-07 – 2020-12-13 (×33): 200 mL

## 2020-12-07 NOTE — TOC Initial Note (Signed)
Transition of Care [facility]) - Initial/Assessment Note    Patient Details  Name: Courtney Davies MRN: [id number] Date of Birth: Sep 06, 1975  Transition of Care [facility]) CM/SW Contact:    Bethann Berkshire, [place] Phone Number: 12/07/2020, 3:50 PM  Clinical Narrative:                  Csw called pt's mother to discuss SNF recommendation. Mother requests CSW contact pt's sister regarding disposition. CSW called pt sister who is agreeable to SNF. Pt was living at home in [place] with her 2 adult sons. Pt is not covid vaccinated. Sister informs CSW that pt was covid positive 2 weeks prior to her admission and was treated by her PCP. Sister lives in [place] but will be primary contact regarding pt's care. She will be visiting this upcoming weekend. Sister requests a SNF in [facility] if possible but is okay with faxing to [place] facilities. TOC will follow and fax bed requests once pt is closer to [place].   Expected Discharge Plan: Skilled Nursing Facility Barriers to Discharge: Continued Medical Work up   Patient Goals and CMS Choice        Expected Discharge Plan and Services Expected Discharge Plan: [place] arrangements for the past 2 months: Single Family Home                                      Prior Living Arrangements/Services Living arrangements for the past 2 months: Single Family Home Lives with:: Adult Children          Need for Family Participation in Patient Care: Yes (Comment) Care giver support system in place?: No (comment)      Activities of Daily Living Home Assistive Devices/Equipment: None ADL Screening (condition at time of admission) Patient's cognitive ability adequate to safely complete daily activities?: No Is the patient deaf or have difficulty hearing?: No Does the patient have difficulty seeing, even when wearing glasses/contacts?: No Does the patient have difficulty concentrating, remembering, or making decisions?:  No Patient able to express need for assistance with ADLs?: No Does the patient have difficulty dressing or bathing?: Yes Independently performs ADLs?: No Communication: Dependent Is this a change from baseline?: Change from baseline, expected to last >3 days Dressing (OT): Dependent Is this a change from baseline?: Change from baseline, expected to last >3 days Grooming: Dependent Is this a change from baseline?: Change from baseline, expected to last >3 days Feeding: Dependent Is this a change from baseline?: Change from baseline, expected to last >3 days Bathing: Dependent Is this a change from baseline?: Change from baseline, expected to last >3 days Toileting: Dependent Is this a change from baseline?: Change from baseline, expected to last >3days In/Out Bed: Dependent Is this a change from baseline?: Change from baseline, expected to last >3 days Walks in Home: Dependent Is this a change from baseline?: Change from baseline, expected to last >3 days Does the patient have difficulty walking or climbing stairs?: Yes Weakness of Legs: Both Weakness of Arms/Hands: Both  Permission Sought/Granted                  Emotional Assessment              Admission diagnosis:  Cardiac arrest ([place]) [I46.9] Difficult intravenous access [Z78.9] Endotracheal tube present [Z97.8] Patient Active Problem List  Diagnosis Date Noted   Acute systolic heart failure (HCC)    Encephalopathy acute    Type 1 diabetes mellitus without complication ([place]) 48/18/5909   Hypertension 12/02/2020   GERD (gastroesophageal reflux disease) 12/02/2020   Anemia 12/02/2020   Cardiac arrest ([place]) 12/01/2020   PCP:  Pcp, No Pharmacy:   [facility] [phone] - [place], [address] [place] [address] Phone: [phone] Fax: [phone]     Social Determinants of Health (SDOH) Interventions    Readmission Risk Interventions No  flowsheet data found.

## 2020-12-07 NOTE — Progress Notes (Signed)
Foley removed per order and purewick placed. Patient has documented latex allergy.Dr. Lynetta Mare gave verbal instructions to place purewick and monitor for skin reactions.

## 2020-12-07 NOTE — Procedures (Signed)
Cortrak  Person Inserting Tube:  Alicen Donalson, RD Tube Type:  Cortrak - 43 inches Tube Size:  10 Tube Location:  Left nare Initial Placement:  Stomach Secured by: Bridle Technique Used to Measure Tube Placement:  Marking at nare/corner of mouth Cortrak Secured At:  65 cm Procedure Comments:  Cortrak Tube Team Note:  Consult received to place a Cortrak feeding tube.   X-ray is required, abdominal x-ray has been ordered by the Cortrak team. Please confirm tube placement before using the Cortrak tube.   If the tube becomes dislodged please keep the tube and contact the Cortrak team at [email or web address] (password [email or web address]) for replacement.  If after hours and replacement cannot be delayed, place a NG tube and confirm placement with an abdominal x-ray.    Mariana Single MS, RD, LDN, CNSC Clinical Nutrition Pager listed in [place]

## 2020-12-07 NOTE — Progress Notes (Signed)
SLP Cancellation Note  Patient Details Name: Courtney Davies MRN: [id number] DOB: [birth year]   Cancelled treatment:        Reviewed chart, spoke with RN and observed pt through glass door. She is lethargic, opened eyes briefly. It is likely she will needs instrumental assessment with MBS following 5-6 day intubation. Suspect she will be unable to consume adequate po's and would not recommend MBS today. ST plans to return tomorrow for appropriateness for MBS. Recommend that Cortrak be replaced.   [place] Siren 12/07/2020, 10:11 AM

## 2020-12-07 NOTE — Progress Notes (Signed)
Progress Note  Patient Name: Courtney Davies Date of Encounter: 12/07/2020  [facility] HeartCare Cardiologist: None   Subjective   Extubated yesterday.  Unable to provide any history.  Inpatient Medications    Scheduled Meds:  acetaminophen (TYLENOL) oral liquid 160 mg/5 mL  650 mg Per Tube Q6H   albuterol  2 puff Inhalation BID   amiodarone  200 mg Per Tube BID   Followed by   Derrill Memo ON 12/10/2020] amiodarone  200 mg Per Tube Daily   chlorhexidine gluconate (MEDLINE KIT)  15 mL Mouth Rinse BID   Chlorhexidine Gluconate Cloth  6 each Topical Daily   insulin aspart  0-20 Units Subcutaneous Q4H   insulin detemir  5 Units Subcutaneous Daily   losartan  25 mg Oral Daily   mouth rinse  15 mL Mouth Rinse q12n4p   methylPREDNISolone (SOLU-MEDROL) injection  40 mg Intravenous Daily   Continuous Infusions:  cefTRIAXone (ROCEPHIN)  IV Stopped (12/06/20 1114)   feeding supplement (VITAL AF 1.2 CAL) Stopped (12/07/20 0629)   heparin 1,500 Units/hr (12/07/20 0600)   magnesium sulfate bolus IVPB     PRN Meds: albuterol, docusate, fentaNYL (SUBLIMAZE) injection, labetalol, ondansetron (ZOFRAN) IV, sodium chloride flush   Vital Signs    Vitals:   12/07/20 0100 12/07/20 0400 12/07/20 0500 12/07/20 0600  BP: (!) 146/83  (!) 184/102 (!) 142/114  Pulse: 96 (!) 102 96 91  Resp: (!) 21 11 14 13   Temp:      TempSrc:      SpO2: 94% 95% 98% 99%  Weight:  102.1 kg    Height:        Intake/Output Summary (Last 24 hours) at 12/07/2020 0655 Last data filed at 12/07/2020 0600 Gross per 24 hour  Intake 1452.9 ml  Output 5800 ml  Net -4347.1 ml   Last 3 Weights 12/07/2020 12/06/2020 12/03/2020  Weight (lbs) 225 lb 1.4 oz 235 lb 14.3 oz 232 lb 2.3 oz  Weight (kg) 102.1 kg 107 kg 105.3 kg      Telemetry    Sinus rhythm with PACs, no ventricular arrhythmia- Personally Reviewed   Physical Exam  Awake, not really tracking consistently.  Does make eye contact at times.  Verbally  unresponsive. GEN: No acute distress.   Neck: No JVD Cardiac: RRR, no murmurs, rubs, or gallops.  Respiratory: Diffuse rhonchi GI: Soft, nontender, non-distended  MS: No edema; No deformity. Neuro: Appears to move all extremities, does not follow commands Psych: Unable to assess  Labs    High Sensitivity Troponin:   Recent Labs  Lab 12/01/20 0913 12/01/20 1706 12/02/20 0834  TROPONINIHS 65* 5,186* 3,074*      Chemistry Recent Labs  Lab 12/01/20 0913 12/01/20 6389 12/02/20 0450 12/03/20 0516 12/05/20 0426 12/06/20 0435 12/07/20 0510  NA 136   < > 140   < > 141 143 147*  K 3.7   < > 3.4*   < > 4.5 3.9 3.6  CL 109   < > 112*   < > 115* 115* 109  CO2 11*  --  19*   < > 21* 22 25  GLUCOSE 368*   < > 140*   < > 192* 182* 179*  BUN 13   < > 16   < > 11 17 20   CREATININE 1.21*   < > 1.23*   < > 0.84 0.92 0.93  CALCIUM 7.8*  --  8.4*   < > 8.6* 8.7* 9.4  PROT 5.7*  --  5.9*  --   --   --   --   ALBUMIN 2.8*  --  3.0*  --   --   --   --   AST 234*  --  144*  --   --   --   --   ALT 284*  --  232*  --   --   --   --   ALKPHOS 110  --  94  --   --   --   --   BILITOT 0.5  --  0.8  --   --   --   --   GFRNONAA 57*  --  56*   < > >60 >60 >60  ANIONGAP 16*  --  9   < > 5 6 13    < > = values in this interval not displayed.     Hematology Recent Labs  Lab 12/05/20 0426 12/06/20 0435 12/07/20 0510  WBC 9.7 12.7* 13.5*  RBC 4.80 4.70 4.71  HGB 10.7* 10.4* 10.4*  HCT 36.1 34.5* 34.0*  MCV 75.2* 73.4* 72.2*  MCH 22.3* 22.1* 22.1*  MCHC 29.6* 30.1 30.6  RDW 17.4* 17.1* 16.1*  PLT 202 238 262    BNP Recent Labs  Lab 12/01/20 0913  BNP 37.2     DDimer  Recent Labs  Lab 12/01/20 0913  DDIMER 4.26*     Radiology    DG CHEST PORT 1 VIEW  Result Date: 12/05/2020 CLINICAL DATA:  Cardiac arrest.  ET tube EXAM: PORTABLE CHEST 1 VIEW COMPARISON:  12/05/2019 FINDINGS: Endotracheal tube, left central line, feeding tube remain in place, unchanged. Mild cardiomegaly,  vascular congestion. Old improving infiltrate at the right lung base. No confluent airspace opacities or effusions or overt edema. IMPRESSION: Support devices stable. Improving right basilar infiltrate. Borderline cardiomegaly, vascular congestion. Electronically Signed   By: Rolm Baptise M.D.   On: 12/05/2020 16:30    Cardiac Studies   2D Echo: IMPRESSIONS     1. Left ventricular ejection fraction, by estimation, is 40 to 45%. The  left ventricle has mildly decreased function. The left ventricle  demonstrates global hypokinesis. There is mild left ventricular  hypertrophy. Left ventricular diastolic parameters  are consistent with Grade I diastolic dysfunction (impaired relaxation).   2. Right ventricular systolic function is normal. The right ventricular  size is normal. There is normal pulmonary artery systolic pressure.   3. The mitral valve is normal in structure. Mild to moderate mitral valve  regurgitation. No evidence of mitral stenosis.   4. The aortic valve was not well visualized. Aortic valve regurgitation  is not visualized. Mild aortic valve sclerosis is present, with no  evidence of aortic valve stenosis.   Patient Profile     46 y.o. female with out of hospital ventricular fibrillation cardiac arrest and anoxic brain injury  Assessment & Plan    VF arrest: remains on oral amiodarone. Etiology of arrest unclear at this time. Reviewed admission notes - rhythm strips from EMS do appear consistent with ventricular fibrillation although there is significant artifact as well. Pt did receive multiple shocks for VF. Also question of respiratory difficulty leading to cardiac arrest. Will need ischemic workup with cardiac catheterization when neurologically recovered. Pending cath results will need EP consult regarding amiodarone, LifeVest, ICD, etc. Transition IV heparin to DVT prophylaxis. Repeat EKG. neurologic status at present is such that I would not recommend cardiac  catheterization at this time.  She seems to have a long way  to go and we will follow peripherally.  We will determine whether to proceed with conservative medical care versus invasive evaluation with catheterization based on her overall neurologic recovery as she is given more time to progress. HTN: Continue losartan.  Add low-dose carvedilol.  Other issues per primary team. Will follow.       For questions or updates, please contact [place] Please consult [email or web address] for contact info under        Signed, Sherren Mocha, MD  12/07/2020, 6:55 AM

## 2020-12-07 NOTE — Progress Notes (Signed)
Nutrition Follow-up  DOCUMENTATION CODES:   Not applicable  INTERVENTION:   Tube Feeding via Cortrak:  Vital 1.5 at 50 ml/hr Pro-Source TF 45 mL BID Provides 103 g of protein, 1880 kcals and 912 mL of free water  Add free water flush 200 mL q 4 hours; total free water 2112 mL   NUTRITION DIAGNOSIS:   Inadequate oral intake related to acute illness as evidenced by NPO status.  Being addressed via TF  GOAL:   Patient will meet greater than or equal to 90% of their needs  Progressing  MONITOR:   Vent status, PO intake, Supplement acceptance, Labs, Weight trends  REASON FOR ASSESSMENT:   Ventilator    ASSESSMENT:   45 yo female admitted post Vfib arrest, COVID+. PMH includes DM, GERD, dysphagia, B12 def  8/19 Extubated but could not protect airway and reintubated, Cortrak placed 8/23 Extubated 8/24 Cortrak replaced  Mental status remains altered, some degree of anoxic injury and degree of recovery is uncertain at this time  Cortrak and bridle removed by pt overnight; replaced today. Previously tolerating TF   SLP consulted but pt not ready for swallow eval  Labs: sodium 147 (H) Meds: ss novolog    Diet Order:   Diet Order             Diet NPO time specified  Diet effective now                   EDUCATION NEEDS:   Not appropriate for education at this time  Skin:  Skin Assessment: Reviewed RN Assessment  Last BM:  8/24  Height:   Ht Readings from Last 1 Encounters:  12/01/20 5' 6"  (1.676 m)    Weight:   Wt Readings from Last 1 Encounters:  12/07/20 102.1 kg     BMI:  Body mass index is 36.33 kg/m.  Estimated Nutritional Needs:   Kcal:  1850-2050 kcals  Protein:  100-120 g  Fluid:  >/= 1.8 L   Kerman Passey MS, RDN, LDN, CNSC Registered Dietitian III Clinical Nutrition RD Pager and On-Call Pager Number Located in [place]

## 2020-12-07 NOTE — Progress Notes (Addendum)
NAME:  Courtney Davies, MRN:  [id number], DOB:  January 05, 1976, LOS: 6 ADMISSION DATE:  12/01/2020, CONSULTATION DATE:  12/01/20 REFERRING MD:  Karle Starch - EM, CHIEF COMPLAINT:  Vfib arrest   History of Present Illness:  44 yo F PMH HTN DM2 B12 def GERD OA presented to [facility] 8/18 as OOH Vfib ~10 min arrest (although may have been much longer, conflicting reports).  COVID +, no STEMI.  CTA neg for PE.  History from family the patient was diagnosed with COVID 4 weeks prior to event, she had recovered fine and was back at work.  She has not had any preceding known chest pain or shortness of breath and patient is generally someone who takes care of herself and will be able seen by a medical provider when she is concerned about her health.  Pertinent  Medical History  B12 deficiency DM GERD Dysphagia  Bronchitis  OA  L knee pain  Significant Hospital Events: Including procedures, antibiotic start and stop dates in addition to other pertinent events   8/18 ED via EMS as Vfib arrest with ROSC, 5 cycles Epi 5x DF and 466m amio. ECG without acute ST segment changes. COVID positive  8/19 sedation hold and extubation, not following commands, started to spike fevers, not protecting airway so reintubated, CVL placed 8/22 Heavily sedated overnight, transition to Precedex, trial SBT/SAT 8/23 successful SBT/SAT, [facility] awake and tracking but not following commands.   Interim History / Subjective:   Successful extubation yesterday. Now on room air, pulled cortrak tube.   Objective   Blood pressure (!) 159/103, pulse (!) 58, temperature 98 F (36.7 C), temperature source Axillary, resp. rate 12, height 5' 6"  (1.676 m), weight 102.1 kg, SpO2 (!) 78 %.        Intake/Output Summary (Last 24 hours) at 12/07/2020 0756 Last data filed at 12/07/2020 0700 Gross per 24 hour  Intake 2096.96 ml  Output 5800 ml  Net -3703.04 ml    Filed Weights   12/03/20 0418 12/06/20 0500 12/07/20 0400   Weight: 105.3 kg 107 kg 102.1 kg     General:  well-nourished F, awake, lying in bed. HEENT: MM pink/moist, sclera anicteric, pupils equal Neuro: opening eyes, moves purposefully but not to commands.  CV: s1s2 normal with no edema PULM:  chest clear with no wheezing.  GI: soft, bsx4 active, FMS still in place.  Extremities: warm/dry, no edema  Skin: no rashes or lesions  Resolved Hospital Problem list   Shock liver Shock AKI  Assessment & Plan:   Witnessed out of hospital Vfib arrest with acute respiratory failure in the setting of baseline asthma and recent COVID-19 infection Hypoxic ischemic encephalopathy Afib, acute HFrEF, HTN Aspiration pneumonitis Enlarged Thyroid  Plan:  - Neurological recovery at this point will take weeks to months. She is protecting her airway and is ready for transfer to PCU. - Continue PT/OT - CIR referral. - Replace cortrak, SLP to evaluate for swallowing as mental status improves.  - If mentation remains at this level, patient might need PEG tube, but would defer that decision until next week to give her time to improve on her own.  - Transition to Lovenox for AF and consider oral treatment once able to swallow.  - Will need ischemic work up, timing will depend on level of neurological recovery  - Ultrasound of thyroid ordered - Complete course of antibiotics for aspiration.  - Appreciate Palliative Care assistance. Will need to establish realistic expectations regarding  her neurological recovery: anything from close to current level or near normal.  - Etiology of arrest unclear - primary respiratory from COVID vs cardiac (CAD or COVID cardiomyopathy) - continue amiodarone, possible LifeVest if recovers.   Best Practice (right click and "Reselect all SmartList Selections" daily)   Diet/type: TF DVT prophylaxis: heparin gtt GI prophylaxis: PPI Lines: yes, needed Foley:  removal ordered  Code Status:  full code Last date of  multidisciplinary goals of care discussion: -Met with 13 family members on 8/22, discussed possibility of significant hypoxic injury, questions answered, full code and aggressive care -Pt's mother updated 8/23  Kipp Brood, MD [facility] ICU Physician [place]  Pager: [phone] Or Epic Secure Chat After hours: [phone].  12/07/2020, 8:22 AM

## 2020-12-07 NOTE — Progress Notes (Signed)
Assisted tele visit to patient with family member.  Mcarthur Rossetti, RN

## 2020-12-07 NOTE — Progress Notes (Signed)
[place] for enoxaparin Indication: a fib/ACS  Labs: Recent Labs    12/05/20 0426 12/06/20 0435 12/07/20 0510  HGB 10.7* 10.4* 10.4*  HCT 36.1 34.5* 34.0*  PLT 202 238 262  HEPARINUNFRC 0.53 0.34 0.27*  CREATININE 0.84 0.92 0.93    Assessment: 45yof admitted after VF arrest.  Troponin  elevated will be evaluated for ischemia.  Heparin switched to LMWH due to COVID status and stable renal function  CBC stable.  Goal of Therapy:  Monitor platelets by anticoagulation protocol: Yes   Plan:  Start enoxaparin 100 mg subQ q 12h (66m/kg). Daily CBC.  Thanks for allowing pharmacy to be a part of this patient's care.  JCathrine Muster PharmD PGY2 Cardiology Pharmacy Resident Phone: [phone]/24/2022  8:55 AM Please check [email or web address] for unit-specific pharmacy phone numbers.

## 2020-12-08 ENCOUNTER — Other Ambulatory Visit (HOSPITAL_COMMUNITY): Payer: Self-pay

## 2020-12-08 DIAGNOSIS — D649 Anemia, unspecified: Secondary | ICD-10-CM | POA: Diagnosis not present

## 2020-12-08 DIAGNOSIS — G9340 Encephalopathy, unspecified: Secondary | ICD-10-CM | POA: Diagnosis not present

## 2020-12-08 DIAGNOSIS — K2101 Gastro-esophageal reflux disease with esophagitis, with bleeding: Secondary | ICD-10-CM

## 2020-12-08 DIAGNOSIS — I469 Cardiac arrest, cause unspecified: Secondary | ICD-10-CM | POA: Diagnosis not present

## 2020-12-08 DIAGNOSIS — I5021 Acute systolic (congestive) heart failure: Secondary | ICD-10-CM | POA: Diagnosis not present

## 2020-12-08 LAB — GLUCOSE, CAPILLARY
Glucose-Capillary: 222 mg/dL — ABNORMAL HIGH (ref 70–99)
Glucose-Capillary: 188 mg/dL — ABNORMAL HIGH (ref 70–99)
Glucose-Capillary: 175 mg/dL — ABNORMAL HIGH (ref 70–99)
Glucose-Capillary: 166 mg/dL — ABNORMAL HIGH (ref 70–99)
Glucose-Capillary: 206 mg/dL — ABNORMAL HIGH (ref 70–99)

## 2020-12-08 LAB — BASIC METABOLIC PANEL
Anion gap: 9 (ref 5–15)
BUN: 19 mg/dL (ref 6–20)
CO2: 23 mmol/L (ref 22–32)
Calcium: 9.2 mg/dL (ref 8.9–10.3)
Chloride: 110 mmol/L (ref 98–111)
Creatinine, Ser: 0.86 mg/dL (ref 0.44–1.00)
GFR, Estimated: >60 mL/min (ref >60)
Glucose, Bld: 180 mg/dL — ABNORMAL HIGH (ref 70–99)
Potassium: 4.1 mmol/L (ref 3.5–5.1)
Sodium: 142 mmol/L (ref 135–145)

## 2020-12-08 LAB — CBC WITH DIFFERENTIAL/PLATELET
Abs Immature Granulocytes: 0.1 10*3/uL — ABNORMAL HIGH (ref 0.00–0.07)
Basophils Absolute: 0 10*3/uL (ref 0.0–0.1)
Basophils Relative: 0 %
Eosinophils Absolute: 0.6 10*3/uL — ABNORMAL HIGH (ref 0.0–0.5)
Eosinophils Relative: 4 %
HCT: 36.3 % (ref 36.0–46.0)
Hemoglobin: 11.6 g/dL — ABNORMAL LOW (ref 12.0–15.0)
Immature Granulocytes: 1 %
Lymphocytes Relative: 25 %
Lymphs Abs: 3.7 10*3/uL (ref 0.7–4.0)
MCH: 22.7 pg — ABNORMAL LOW (ref 26.0–34.0)
MCHC: 32 g/dL (ref 30.0–36.0)
MCV: 71 fL — ABNORMAL LOW (ref 80.0–100.0)
Monocytes Absolute: 1.2 10*3/uL — ABNORMAL HIGH (ref 0.1–1.0)
Monocytes Relative: 8 %
Neutro Abs: 9 10*3/uL — ABNORMAL HIGH (ref 1.7–7.7)
Neutrophils Relative %: 62 %
Platelets: 280 10*3/uL (ref 150–400)
RBC: 5.11 MIL/uL (ref 3.87–5.11)
RDW: 15.7 % — ABNORMAL HIGH (ref 11.5–15.5)
WBC: 14.5 10*3/uL — ABNORMAL HIGH (ref 4.0–10.5)
nRBC: 0 % (ref 0.0–0.2)

## 2020-12-08 MED ORDER — LOSARTAN POTASSIUM 25 MG PO TABS
12.5000 mg | ORAL_TABLET | Freq: Every day | ORAL | Status: DC
  Administered 2020-12-08: 12.5 mg via ORAL
  Filled 2020-12-08: qty 1

## 2020-12-08 MED ORDER — CARVEDILOL 6.25 MG PO TABS
6.2500 mg | ORAL_TABLET | Freq: Two times a day (BID) | ORAL | Status: DC
  Administered 2020-12-08: 6.25 mg via ORAL
  Filled 2020-12-08: qty 1

## 2020-12-08 NOTE — TOC Benefit Eligibility Note (Addendum)
Patient Teacher, English as a foreign language completed.    The patient is currently admitted and upon discharge could be taking Eliquis 5 mg.  The current 30 day co-pay is, $4.00.   The patient is currently admitted and upon discharge could be taking Farxiga 10 mg.  Requires Prior Authorization  The patient is currently admitted and upon discharge could be taking Jardiance 10 mg.  Requires Prior Authorization   The patient is currently admitted and upon discharge could be taking Entresto 24-26 mg.  Requires Prior Authorization   The patient is insured through [place] [place] Patient Advocate Specialist [place] Team Direct Number: [phone]  Fax: [phone]

## 2020-12-08 NOTE — Progress Notes (Addendum)
PROGRESS NOTE  Courtney Davies KDX:[id number] DOB: 1975/10/18 DOA: 12/01/2020 PCP: Pcp, No   LOS: 7 days   Brief narrative:  45 years old female with past medical history of hypertension, vitamin B12 deficiency, GERD, obstructive sleep apnea, presented to the hospital on 12/01/2020 with ventricular fibrillation for around 10 minutes.  Patient was initially admitted to the ICU for V. fib arrest on 8/18 and ECG showed no ischemic changes.  On 12/07/2018 patient was successfully extubated to nasal cannula.  Patient has history of COVID positive.  CTA was negative for pulmonary embolism.  He was subsequently considered stable for transfer out of the ICU.  Assessment/Plan:  Active Problems:   Cardiac arrest (HCC)   Type 1 diabetes mellitus without complication (HCC)   Hypertension   GERD (gastroesophageal reflux disease)   Anemia   Encephalopathy acute   Acute systolic heart failure (HCC)   Ventricular fibrillation arrest with acute respiratory failure.  History of COVID-19 and asthma.  Hypoxic encephalopathy. Neurology recovery likely to take prolonged duration.  On cortrak tube tube for feeding.  Will need to make a decision on PEG tube if not improving.  Will need ischemic work-up once neurological improvement.  Continue amiodarone, possible LifeVest if recovers.  Continue Coreg.  Acute systolic heart failure.  Reduced LV function.  Patient is on Coreg.  Will uptitrate to 6.25 milligrams twice daily and add losartan low-dose.  Atrial fibrillation On Lovenox subcu.  Ultrasound of the thyroid shows some nodules..  Aspiration pneumonitis. Right basilar infiltrate noted on the x-ray.  Has completed Rocephin.  Mild leukocytosis noted.  Debility, deconditioning.   PT OT on board.  Nutrition.  On cortrak tube tube feeding.  Continue free water flushes.   DVT prophylaxis: SCDs Start: 12/01/20 1303, Lovenox subcu   Code Status: Full code  Family Communication: None.  Status is:  Inpatient  Remains inpatient appropriate because:IV treatments appropriate due to intensity of illness or inability to take PO and Inpatient level of care appropriate due to severity of illness  Dispo: The patient is from: Home              Anticipated d/c is to:  Undetermined at this time.              Patient currently is not medically stable to d/c.   Difficult to place patient Yes  Consultants: PCCM  Procedures: Intubation and mechanical ventilation.  Extubation Cortrak tube tube placement  Anti-infectives:  None at this time  Anti-infectives (From admission, onward)    Start     Dose/Rate Route Frequency Ordered Stop   12/04/20 1000  cefTRIAXone (ROCEPHIN) 2 g in sodium chloride 0.9 % 100 mL IVPB        2 g 200 mL/hr over 30 Minutes Intravenous Every 24 hours 12/04/20 0800 12/08/20 1118   12/03/20 1930  ceFEPIme (MAXIPIME) 2 g in sodium chloride 0.9 % 100 mL IVPB  Status:  Discontinued        2 g 200 mL/hr over 30 Minutes Intravenous Every 8 hours 12/03/20 1826 12/04/20 0800   12/02/20 0900  cefTRIAXone (ROCEPHIN) 2 g in sodium chloride 0.9 % 100 mL IVPB  Status:  Discontinued        2 g 200 mL/hr over 30 Minutes Intravenous Every 24 hours 12/02/20 0811 12/03/20 1811   12/01/20 1600  Ampicillin-Sulbactam (UNASYN) 3 g in sodium chloride 0.9 % 100 mL IVPB  Status:  Discontinued        3 g  200 mL/hr over 30 Minutes Intravenous Every 6 hours 12/01/20 1333 12/02/20 0811   12/01/20 1200  ceFEPIme (MAXIPIME) 2 g in sodium chloride 0.9 % 100 mL IVPB  Status:  Discontinued        2 g 200 mL/hr over 30 Minutes Intravenous Every 8 hours 12/01/20 1145 12/01/20 1332   12/01/20 1145  vancomycin (VANCOCIN) 2,000 mg in sodium chloride 0.9 % 500 mL IVPB  Status:  Discontinued        2,000 mg 250 mL/hr over 120 Minutes Intravenous  Once 12/01/20 1145 12/01/20 1332        Subjective: Today, patient was seen and examined at bedside.  Opens eyes spontaneously but none on verbal  command.  Objective: Vitals:   12/08/20 1000 12/08/20 1100  BP:  (!) 151/90  Pulse: 87 97  Resp: (!) 24 18  Temp:    SpO2: 100% 100%    Intake/Output Summary (Last 24 hours) at 12/08/2020 1129 Last data filed at 12/08/2020 0900 Gross per 24 hour  Intake 2268.06 ml  Output 1730 ml  Net 538.06 ml   Filed Weights   12/06/20 0500 12/07/20 0400 12/08/20 0500  Weight: 107 kg 102.1 kg 103.3 kg   Body mass index is 36.76 kg/m.   Physical Exam: GENERAL: Patient is alert awake but not responsive to commands.  Not in obvious distress. HENT: No scleral pallor or icterus. Pupils equally reactive to light. Oral mucosa is moist.  Cortrak tube tube in place NECK: is supple, no gross swelling noted. CHEST: Clear to auscultation. No crackles or wheezes.  Diminished breath sounds bilaterally. CVS: S1 and S2 heard, no murmur. Regular rate and rhythm.  ABDOMEN: Soft, non-tender, bowel sounds are present. EXTREMITIES: No edema. CNS: Cranial nerves are intact. No focal motor deficits. SKIN: warm and dry without rashes.  Data Review: I have personally reviewed the following laboratory data and studies,  CBC: Recent Labs  Lab 12/04/20 0431 12/05/20 0426 12/06/20 0435 12/07/20 0510 12/08/20 0250  WBC 11.6* 9.7 12.7* 13.5* 14.5*  NEUTROABS  --   --   --   --  9.0*  HGB 11.6* 10.7* 10.4* 10.4* 11.6*  HCT 38.9 36.1 34.5* 34.0* 36.3  MCV 75.4* 75.2* 73.4* 72.2* 71.0*  PLT 233 202 238 262 751   Basic Metabolic Panel: Recent Labs  Lab 12/02/20 0450 12/02/20 1757 12/03/20 0516 12/03/20 1815 12/04/20 0431 12/05/20 0426 12/06/20 0435 12/07/20 0510 12/08/20 0250  NA 140  --  138  --  140 141 143 147* 142  K 3.4*  --  3.5  --  4.4 4.5 3.9 3.6 4.1  CL 112*  --  112*  --  114* 115* 115* 109 110  CO2 19*  --  19*  --  20* 21* 22 25 23   GLUCOSE 140*  --  134*  --  137* 192* 182* 179* 180*  BUN 16  --  19  --  16 11 17 20 19   CREATININE 1.23*  --  1.40*  --  0.99 0.84 0.92 0.93 0.86   CALCIUM 8.4*  --  8.3*  --  8.6* 8.6* 8.7* 9.4 9.2  MG 1.6* 2.4 2.4 2.2 2.1 2.1 1.9 1.8  --   PHOS 4.6 4.7* 4.4 3.4 3.7  --   --   --   --    Liver Function Tests: Recent Labs  Lab 12/02/20 0450  AST 144*  ALT 232*  ALKPHOS 94  BILITOT 0.8  PROT 5.9*  ALBUMIN  3.0*   No results for input(s): LIPASE, AMYLASE in the last 168 hours. No results for input(s): AMMONIA in the last 168 hours. Cardiac Enzymes: No results for input(s): CKTOTAL, CKMB, CKMBINDEX, TROPONINI in the last 168 hours. BNP (last 3 results) Recent Labs    12/01/20 0913  BNP 37.2    ProBNP (last 3 results) No results for input(s): PROBNP in the last 8760 hours.  CBG: Recent Labs  Lab 12/07/20 1533 12/07/20 2000 12/07/20 2342 12/08/20 0427 12/08/20 0816  GLUCAP 171* 198* 193* 222* 188*   Recent Results (from the past 240 hour(s))  Resp Panel by RT-PCR (Flu A&B, Covid) Nasopharyngeal Swab     Status: Abnormal   Collection Time: 12/01/20 10:08 AM   Specimen: Nasopharyngeal Swab; Nasopharyngeal(NP) swabs in vial transport medium  Result Value Ref Range Status   SARS Coronavirus 2 by RT PCR POSITIVE (A) NEGATIVE Final    Comment: RESULT CALLED TO, READ BACK BY AND VERIFIED WITH: EMILY DIXON 12/01/2020 @1138  BY JW (NOTE) SARS-CoV-2 target nucleic acids are DETECTED.  The SARS-CoV-2 RNA is generally detectable in upper respiratory specimens during the acute phase of infection. Positive results are indicative of the presence of the identified virus, but do not rule out bacterial infection or co-infection with other pathogens not detected by the test. Clinical correlation with patient history and other diagnostic information is necessary to determine patient infection status. The expected result is Negative.  Fact Sheet for Patients: EntrepreneurPulse.com.au  Fact Sheet for Healthcare Providers: IncredibleEmployment.be  This test is not yet approved or cleared by  the [place] FDA and  has been authorized for detection and/or diagnosis of SARS-CoV-2 by FDA under an Emergency Use Authorization (EUA).  This EUA will remain in effect (meaning this test can be  used) for the duration of  the COVID-19 declaration under Section 564(b)(1) of the Act, 21 U.S.C. section 360bbb-3(b)(1), unless the authorization is terminated or revoked sooner.     Influenza A by PCR NEGATIVE NEGATIVE Final   Influenza B by PCR NEGATIVE NEGATIVE Final    Comment: (NOTE) The Xpert Xpress SARS-CoV-2/FLU/RSV plus assay is intended as an aid in the diagnosis of influenza from Nasopharyngeal swab specimens and should not be used as a sole basis for treatment. Nasal washings and aspirates are unacceptable for Xpert Xpress SARS-CoV-2/FLU/RSV testing.  Fact Sheet for Patients: EntrepreneurPulse.com.au  Fact Sheet for Healthcare Providers: IncredibleEmployment.be  This test is not yet approved or cleared by the [place] FDA and has been authorized for detection and/or diagnosis of SARS-CoV-2 by FDA under an Emergency Use Authorization (EUA). This EUA will remain in effect (meaning this test can be used) for the duration of the COVID-19 declaration under Section 564(b)(1) of the Act, 21 U.S.C. section 360bbb-3(b)(1), unless the authorization is terminated or revoked.  Performed at [facility], [place] [address] [place], [place] [address]   Blood culture (routine x 2)     Status: None   Collection Time: 12/01/20 10:11 AM   Specimen: BLOOD LEFT HAND  Result Value Ref Range Status   Specimen Description BLOOD LEFT HAND  Final   Special Requests   Final    BOTTLES DRAWN AEROBIC ONLY Blood Culture results may not be optimal due to an inadequate volume of blood received in culture bottles   Culture   Final    NO GROWTH 5 DAYS Performed at [facility], [place] [address], [place], [place] [address]    Report Status  12/06/2020 FINAL  Final  Blood culture (routine x 2)     Status: None   Collection Time: 12/01/20 10:20 AM   Specimen: BLOOD RIGHT FOREARM  Result Value Ref Range Status   Specimen Description BLOOD RIGHT FOREARM  Final   Special Requests   Final    BOTTLES DRAWN AEROBIC AND ANAEROBIC Blood Culture adequate volume   Culture   Final    NO GROWTH 5 DAYS Performed at [facility], [place] [address] [place], [place] [address]    Report Status 12/06/2020 FINAL  Final  MRSA Next Gen by PCR, Nasal     Status: None   Collection Time: 12/02/20  6:11 AM   Specimen: Nasal Mucosa; Nasal Swab  Result Value Ref Range Status   MRSA by PCR Next Gen NOT DETECTED NOT DETECTED Final    Comment: (NOTE) The GeneXpert MRSA Assay (FDA approved for NASAL specimens only), is one component of a comprehensive MRSA colonization surveillance program. It is not intended to diagnose MRSA infection nor to guide or monitor treatment for MRSA infections. Test performance is not FDA approved in patients less than 59 years old. Performed at [facility], [place] [address], [place], [place] [address]   Culture, Respiratory w Gram Stain     Status: None   Collection Time: 12/03/20 12:10 AM   Specimen: Tracheal Aspirate; Respiratory  Result Value Ref Range Status   Specimen Description TRACHEAL ASPIRATE  Final   Special Requests NONE  Final   Gram Stain   Final    ABUNDANT WBC PRESENT,BOTH PMN AND MONONUCLEAR NO ORGANISMS SEEN    Culture   Final    NO GROWTH 2 DAYS Performed at [facility], [address], [place], [place] [address]    Report Status 12/06/2020 FINAL  Final     Studies: DG Abd Portable 1V  Result Date: 12/07/2020 CLINICAL DATA:  Feeding tube placement EXAM: PORTABLE ABDOMEN - 1 VIEW COMPARISON:  KUB 12/02/2020 FINDINGS: Enteric catheter is in place with the tip projecting over the expected region of the pylorus/first portion of the duodenum. There is a relative paucity of bowel  gas in the imaged abdomen without evidence of mechanical obstruction. The bones are stable. IMPRESSION: Enteric catheter tip projects over the expected location of the pylorus/first portion of the duodenum. Electronically Signed   By: Valetta Mole M.D.   On: 12/07/2020 11:11   US THYROID  Result Date: 12/07/2020 CLINICAL DATA:  Goiter. EXAM: THYROID ULTRASOUND TECHNIQUE: Ultrasound examination of the thyroid gland and adjacent soft tissues was performed. COMPARISON:  06/02/2004 FINDINGS: Parenchymal Echotexture: Mildly heterogeneous Isthmus: 1.4 cm Right lobe: 5.9 x 3.3 x 2.9 cm Left lobe: 5.7 x 2.9 x 3.1 cm _________________________________________________________ Estimated total number of nodules >/= 1 cm: 1 Number of spongiform nodules >/=  2 cm not described below (TR1): 0 Number of mixed cystic and solid nodules >/= 1.5 cm not described below ([place]): 0 _________________________________________________________ Nodule # 1: Location: Right; Inferior Maximum size: 2.3 cm; Other 2 dimensions: 1.9 x 1.9 cm, previously measured 1.2 x 1.0 x 1.1 cm in 2006. Composition: solid/almost completely solid (2) Echogenicity: hypoechoic (2) Shape: not taller-than-wide (0) Margins: ill-defined (0) Echogenic foci: none (0) ACR TI-RADS total points: 4. ACR TI-RADS risk category: TR4 (4-6 points). ACR TI-RADS recommendations: **Given size (>/= 1.5 cm) and appearance, fine needle aspiration of this moderately suspicious nodule should be considered based on TI-RADS criteria. _________________________________________________________ IMPRESSION: Nodule 1 (TI-RADS 4) located  in the inferior right thyroid lobe, meets criteria for FNA. This nodule demonstrate interval threshold growth since 2006. The above is in keeping with the ACR TI-RADS recommendations - J Am Coll Radiol 2017;14:587-595. Electronically Signed   By: Miachel Roux M.D.   On: 12/07/2020 10:55      Flora Lipps, MD  [facility] 12/08/2020  If 7PM-7AM,  please contact night-coverage

## 2020-12-08 NOTE — Progress Notes (Signed)
Physical Therapy Treatment Patient Details Name: Courtney Davies MRN: [id number] DOB: Sep 10, 1975 Today's Date: 12/08/2020    History of Present Illness Pt is a 45 y/o female admitted 8/18 with Vfib arrest with ROSC. Covid. CTA negative for PE. MRI 8/21 consistent with hypoxic/ischemic injury. 8/21 RLL infiltrate seen on CXR from aspiration pna. Intubated 8/18-23 (failed initial attempt to extubate 8/19). PMh includes: B12 deficiency, DM, OA, bronchitis, dysphagia.    PT Comments    Pt with eyes open head and gaze L on arrival.  With multiple vocal cues, pt directed gaze R to voice.  Pt increasingly directed gaze R during the session, but generally preferred attention to the L side.  Emphasis on ROM, rolling, transition to EOB, work on sitting balance/ appropriate truncal activation, face to face sit to stand for pad placement and repositioning.    Follow Up Recommendations  SNF;Supervision/Assistance - 24 hour     Equipment Recommendations  Wheelchair (measurements PT);Wheelchair cushion (measurements PT);Hospital bed;Other (comment) (TBD)    Recommendations for Other Services       Precautions / Restrictions Precautions Precautions: Fall Precaution Comments: flexiseal, cortrak, B mitts Required Braces or Orthoses: Other Brace Other Brace: bil prevalon at rest    Mobility  Bed Mobility Overal bed mobility: Needs Assistance Bed Mobility: Sidelying to Sit;Rolling;Sit to Sidelying Rolling: Total assist;+2 for physical assistance;+2 for safety/equipment Sidelying to sit: Total assist;+2 for physical assistance;+2 for safety/equipment     Sit to sidelying: +2 for physical assistance;Total assist;+2 for safety/equipment General bed mobility comments: total assist +2 to roll and reposition in bed, max mulitmodal cueing to attempt maintaining grasp in sidelying during pericare    Transfers Overall transfer level: Needs assistance   Transfers: Sit to/from Stand Sit to Stand:  Total assist         General transfer comment: stand to place pad and scoot toward [facility]  Ambulation/Gait                 Stairs             Wheelchair Mobility    Modified Rankin (Stroke Patients Only) Modified Rankin (Stroke Patients Only) Modified Rankin: Severe disability     Balance   Sitting-balance support: No upper extremity supported;Feet supported Sitting balance-Leahy Scale: Poor Sitting balance - Comments: L lean preference, mod assist to maintain midline statically but no righting reactions outside of midline                                    Cognition Arousal/Alertness: Awake/alert Behavior During Therapy: Flat affect Overall Cognitive Status: Difficult to assess Area of Impairment: Attention                   Current Attention Level: Focused           General Comments: pt not following commands, will track thearpist in room with increased time to R side but remains with L sided enviorment/gaze preference      Exercises Other Exercises Other Exercises: P/AA ROM to bil LE, heel cord stretch Other Exercises: PROM to BUEs, repositoined on pillows    General Comments General comments (skin integrity, edema, etc.): on RA during session VSS      Pertinent Vitals/Pain Pain Assessment: Faces Faces Pain Scale: Hurts even more Pain Location: RN replacing flexiseal Pain Descriptors / Indicators: Discomfort;Grimacing Pain Intervention(s): Monitored during session    Home Living  Prior Function            PT Goals (current goals can now be found in the care plan section) Acute Rehab PT Goals Patient Stated Goal: none stated PT Goal Formulation: Patient unable to participate in goal setting Time For Goal Achievement: 12/20/20 Potential to Achieve Goals: Fair Progress towards PT goals: Progressing toward goals    Frequency    Min 2X/week      PT Plan Current plan remains  appropriate    Co-evaluation PT/OT/SLP Co-Evaluation/Treatment: Yes Reason for Co-Treatment: Complexity of the patient's impairments (multi-system involvement) PT goals addressed during session: Mobility/safety with mobility        AM-PAC PT "6 Clicks" Mobility   Outcome Measure  Help needed turning from your back to your side while in a flat bed without using bedrails?: Total Help needed moving from lying on your back to sitting on the side of a flat bed without using bedrails?: Total Help needed moving to and from a bed to a chair (including a wheelchair)?: Total Help needed standing up from a chair using your arms (e.g., wheelchair or bedside chair)?: Total Help needed to walk in hospital room?: Total Help needed climbing 3-5 steps with a railing? : Total 6 Click Score: 6    End of Session   Activity Tolerance: Patient tolerated treatment well Patient left: in bed;with call bell/phone within reach;with restraints reapplied Nurse Communication: Mobility status PT Visit Diagnosis: Other abnormalities of gait and mobility (R26.89);Other symptoms and signs involving the nervous system (R29.898);Muscle weakness (generalized) (M62.81)     Time: 1125-1207 PT Time Calculation (min) (ACUTE ONLY): 42 min  Charges:  $Therapeutic Activity: 8-22 mins                     12/08/2020  Ginger Carne., PT Acute Rehabilitation Services [phone]  (pager) [phone]  (office)   Tessie Fass Tyhesha Dutson 12/08/2020, 4:12 PM

## 2020-12-08 NOTE — Progress Notes (Signed)
Occupational Therapy Treatment Patient Details Name: Courtney Davies MRN: [id number] DOB: 1976/03/11 Today's Date: 12/08/2020    History of present illness Pt is a 45 y/o female admitted 8/18 with Vfib arrest with ROSC. Covid. CTA negative for PE. MRI 8/21 consistent with hypoxic/ischemic injury. 8/21 RLL infiltrate seen on CXR from aspiration pna. Intubated 8/18-23 (failed initial attempt to extubate 8/19). PMh includes: B12 deficiency, DM, OA, bronchitis, dysphagia.   OT comments  Pt continues to require total assist +2 for bed mobility, progressed to EOB sitting with mod assist today.  Total assist for toileting hygiene due to flexiseal coming out and RN assisting to replace. Pt not following commands, but improved scanning and tracking more consistently to R side; even turning head to locate therapist upon entry. VSS during session. Will follow acutely.    Follow Up Recommendations  SNF;Supervision/Assistance - 24 hour    Equipment Recommendations  Other (comment) (TBD)    Recommendations for Other Services      Precautions / Restrictions Precautions Precautions: Fall Precaution Comments: flexiseal, cortrak, B mitts Required Braces or Orthoses: Other Brace Other Brace: bil prevalon at rest Restrictions Weight Bearing Restrictions: No       Mobility Bed Mobility Overal bed mobility: Needs Assistance Bed Mobility: Sidelying to Sit;Rolling;Sit to Sidelying Rolling: Total assist;+2 for physical assistance;+2 for safety/equipment Sidelying to sit: Total assist;+2 for physical assistance;+2 for safety/equipment     Sit to sidelying: +2 for physical assistance;Total assist;+2 for safety/equipment General bed mobility comments: total assist +2 to roll and reposition in bed, max mulitmodal cueing to attempt maintaining grasp in sidelying during pericare    Transfers                      Balance Overall balance assessment: Needs assistance Sitting-balance support: No  upper extremity supported;Feet supported Sitting balance-Leahy Scale: Poor Sitting balance - Comments: L lean preference, mod assist to maintain midline statically but no righting reactions outside of midline                                   ADL either performed or assessed with clinical judgement   ADL Overall ADL's : Needs assistance/impaired Eating/Feeding: NPO   Grooming: Total assistance;Bed level Grooming Details (indicate cue type and reason): hand over hand attempted to suction mouth, pt grimacing and not opening mouth                     Toileting- Clothing Manipulation and Hygiene: Total assistance;+2 for physical assistance;+2 for safety/equipment;Bed level       Functional mobility during ADLs: Total assistance;+2 for physical assistance;+2 for safety/equipment General ADL Comments: total assist for all self care     Vision       Perception     Praxis      Cognition Arousal/Alertness: Awake/alert Behavior During Therapy: Flat affect Overall Cognitive Status: Difficult to assess Area of Impairment: Attention                   Current Attention Level: Focused           General Comments: pt not following commands, will track thearpist in room with increased time to R side but remains with L sided enviorment/gaze preference        Exercises Exercises: Other exercises Other Exercises Other Exercises: PROM to BUEs, repositoined on pillows   Shoulder Instructions  General Comments on RA during session- VSS    Pertinent Vitals/ Pain       Pain Assessment: Faces Faces Pain Scale: Hurts even more Pain Location: RN replacing flexiseal Pain Descriptors / Indicators: Discomfort;Grimacing Pain Intervention(s): Monitored during session;Repositioned  Home Living                                          Prior Functioning/Environment              Frequency  Min 3X/week        Progress  Toward Goals  OT Goals(current goals can now be found in the care plan section)  Progress towards OT goals: Progressing toward goals (slowly)  Acute Rehab OT Goals Patient Stated Goal: none stated OT Goal Formulation: Patient unable to participate in goal setting  Plan Discharge plan remains appropriate;Frequency remains appropriate    Co-evaluation    PT/OT/SLP Co-Evaluation/Treatment: Yes Reason for Co-Treatment: Complexity of the patient's impairments (multi-system involvement)   OT goals addressed during session: ADL's and self-care      AM-PAC OT "6 Clicks" Daily Activity     Outcome Measure   Help from another person eating meals?: Total Help from another person taking care of personal grooming?: Total Help from another person toileting, which includes using toliet, bedpan, or urinal?: Total Help from another person bathing (including washing, rinsing, drying)?: Total Help from another person to put on and taking off regular upper body clothing?: Total Help from another person to put on and taking off regular lower body clothing?: Total 6 Click Score: 6    End of Session    OT Visit Diagnosis: Other abnormalities of gait and mobility (R26.89);Muscle weakness (generalized) (M62.81);Other symptoms and signs involving the nervous system (R29.898);Other symptoms and signs involving cognitive function   Activity Tolerance Patient tolerated treatment well   Patient Left in bed;with call bell/phone within reach;with bed alarm set;with restraints reapplied;with SCD's reapplied   Nurse Communication Mobility status;Precautions        Time: 1125-1207 OT Time Calculation (min): 42 min  Charges: OT General Charges $OT Visit: 1 Visit OT Treatments $Self Care/Home Management : 8-22 mins $Therapeutic Activity: 8-22 mins  Jolaine Artist, OT Acute Rehabilitation Services Pager [phone] Office [phone]    Courtney Davies 12/08/2020, 12:54 PM

## 2020-12-08 NOTE — Evaluation (Signed)
Clinical/Bedside Swallow Evaluation Patient Details  Name: Courtney Davies MRN: [id number] Date of Birth: 1976/04/07  Today's Date: 12/08/2020 Time: SLP Start Time (ACUTE ONLY): 9390 SLP Stop Time (ACUTE ONLY): 0937 SLP Time Calculation (min) (ACUTE ONLY): 13 min  Past Medical History:  Past Medical History:  Diagnosis Date   Anemia    Diabetes type 2, controlled ([place])    GERD (gastroesophageal reflux disease)    Hypertension    Past Surgical History: History reviewed. No pertinent surgical history. HPI:  Pt is a 45 y/o female admitted 8/18 with Vfib arrest with ROSC, COVID. CTA negative for PE. ETT 8/18-23 (failed inital attempt to extubate 8/19). MRI 8/21 consistent with hypoxic/ischemic injury. CXR 8/21 RLL infiltrate. PMT 8/23: continued aggressive care; family very hopeful for continued improvement. PMH: B12 deficiency, DM, OA, bronchitis.   Assessment / Plan / Recommendation Clinical Impression  Pt was seen for bedside swallow evaluation. She was alert throughout the evaluation, but she did not communicate verbally and she inconsistently followed some commands with tactile cues. Pt was resistant to oral care and frequently pursed her lips tightly when multiple boluses were presented. Thin liquids were accepted once with verbal and tactile cues, but bolus manipulation was absent and anterior spillage was noted. Puree boluses were consistently and immediately spat out despite verbal prompts. Pt's dysphagia appears to be cognitively based at this time and it is recommended that her NPO status be maintained. SLP will follow to assess improvement. SLP Visit Diagnosis: Dysphagia, unspecified (R13.10)    Aspiration Risk  Moderate aspiration risk;Severe aspiration risk    Diet Recommendation NPO;Alternative means - temporary   Medication Administration: Via alternative means    Other  Recommendations Oral Care Recommendations: Oral care QID;Staff/trained caregiver to provide oral care    Follow up Recommendations Skilled Nursing facility      Frequency and Duration min 2x/week  2 weeks       Prognosis Prognosis for Safe Diet Advancement: Fair Barriers to Reach Goals: Cognitive deficits;Severity of deficits      Swallow Study   General Date of Onset: 12/06/20 HPI: Pt is a 45 y/o female admitted 8/18 with Vfib arrest with ROSC, COVID. CTA negative for PE. ETT 8/18-23 (failed inital attempt to extubate 8/19). MRI 8/21 consistent with hypoxic/ischemic injury. CXR 8/21 RLL infiltrate. PMT 8/23: continued aggressive care; family very hopeful for continued improvement. PMH: B12 deficiency, DM, OA, bronchitis. Type of Study: Bedside Swallow Evaluation Previous Swallow Assessment: none Diet Prior to this Study: NPO;NG Tube Temperature Spikes Noted: No Respiratory Status: Room air History of Recent Intubation: Yes Length of Intubations (days): 5 days Date extubated: 12/06/20 Behavior/Cognition: Alert;Doesn't follow directions;Requires cueing Oral Cavity Assessment: Within Functional Limits Oral Care Completed by SLP: No Oral Cavity - Dentition: Adequate natural dentition Self-Feeding Abilities: Total assist Patient Positioning: Upright in bed;Postural control adequate for testing Baseline Vocal Quality: Not observed Volitional Cough: Cognitively unable to elicit Volitional Swallow: Unable to elicit    Oral/Motor/Sensory Function Overall Oral Motor/Sensory Function:  (UTA)   Ice Chips Ice chips: Impaired Presentation: Spoon Oral Phase Functional Implications:  (oral resistance)   Thin Liquid Thin Liquid: Impaired Presentation: Spoon Oral Phase Impairments: Poor awareness of bolus Oral Phase Functional Implications: Left anterior spillage;Right anterior spillage    Nectar Thick Nectar Thick Liquid: Not tested   Honey Thick Honey Thick Liquid: Not tested   Puree Puree: Impaired Presentation: Spoon Oral Phase Impairments: Poor awareness of bolus   Solid      Solid:  Not tested     [place] I. Hardin Negus, [place], [place] Office number [phone] Pager [phone]  Horton Marshall 12/08/2020,10:02 AM

## 2020-12-09 ENCOUNTER — Other Ambulatory Visit (HOSPITAL_COMMUNITY): Payer: Self-pay

## 2020-12-09 DIAGNOSIS — I469 Cardiac arrest, cause unspecified: Secondary | ICD-10-CM | POA: Diagnosis not present

## 2020-12-09 DIAGNOSIS — I5021 Acute systolic (congestive) heart failure: Secondary | ICD-10-CM | POA: Diagnosis not present

## 2020-12-09 DIAGNOSIS — D649 Anemia, unspecified: Secondary | ICD-10-CM | POA: Diagnosis not present

## 2020-12-09 DIAGNOSIS — G9340 Encephalopathy, unspecified: Secondary | ICD-10-CM | POA: Diagnosis not present

## 2020-12-09 LAB — BASIC METABOLIC PANEL
Anion gap: 8 (ref 5–15)
BUN: 17 mg/dL (ref 6–20)
CO2: 25 mmol/L (ref 22–32)
Calcium: 9 mg/dL (ref 8.9–10.3)
Chloride: 105 mmol/L (ref 98–111)
Creatinine, Ser: 0.87 mg/dL (ref 0.44–1.00)
GFR, Estimated: >60 mL/min (ref >60)
Glucose, Bld: 183 mg/dL — ABNORMAL HIGH (ref 70–99)
Potassium: 3.9 mmol/L (ref 3.5–5.1)
Sodium: 138 mmol/L (ref 135–145)

## 2020-12-09 LAB — MAGNESIUM: Magnesium: 1.8 mg/dL (ref 1.7–2.4)

## 2020-12-09 LAB — GLUCOSE, CAPILLARY
Glucose-Capillary: 175 mg/dL — ABNORMAL HIGH (ref 70–99)
Glucose-Capillary: 182 mg/dL — ABNORMAL HIGH (ref 70–99)
Glucose-Capillary: 190 mg/dL — ABNORMAL HIGH (ref 70–99)
Glucose-Capillary: 193 mg/dL — ABNORMAL HIGH (ref 70–99)
Glucose-Capillary: 213 mg/dL — ABNORMAL HIGH (ref 70–99)
Glucose-Capillary: 226 mg/dL — ABNORMAL HIGH (ref 70–99)

## 2020-12-09 MED ORDER — CARVEDILOL 6.25 MG PO TABS
6.2500 mg | ORAL_TABLET | Freq: Two times a day (BID) | ORAL | Status: DC
  Administered 2020-12-09 – 2021-01-02 (×37): 6.25 mg
  Filled 2020-12-09 (×40): qty 1

## 2020-12-09 MED ORDER — LOSARTAN POTASSIUM 25 MG PO TABS
25.0000 mg | ORAL_TABLET | Freq: Every day | ORAL | Status: DC
  Administered 2020-12-10 – 2021-01-02 (×17): 25 mg
  Filled 2020-12-09 (×20): qty 1

## 2020-12-09 MED ORDER — LOSARTAN POTASSIUM 25 MG PO TABS
12.5000 mg | ORAL_TABLET | Freq: Every day | ORAL | Status: DC
  Administered 2020-12-09: 12.5 mg
  Filled 2020-12-09: qty 1

## 2020-12-09 MED ORDER — POTASSIUM CHLORIDE 20 MEQ PO PACK
20.0000 meq | PACK | Freq: Once | ORAL | Status: AC
  Administered 2020-12-09: 20 meq
  Filled 2020-12-09: qty 1

## 2020-12-09 MED ORDER — MAGNESIUM SULFATE 2 GM/50ML IV SOLN
2.0000 g | Freq: Once | INTRAVENOUS | Status: AC
  Administered 2020-12-09: 2 g via INTRAVENOUS
  Filled 2020-12-09: qty 50

## 2020-12-09 MED ORDER — AMIODARONE HCL 200 MG PO TABS
200.0000 mg | ORAL_TABLET | Freq: Two times a day (BID) | ORAL | Status: DC
  Administered 2020-12-09 – 2020-12-26 (×30): 200 mg
  Filled 2020-12-09 (×30): qty 1

## 2020-12-09 NOTE — TOC Progression Note (Signed)
Transition of Care [facility]) - Progression Note    Patient Details  Name: Courtney Davies MRN: [id number] Date of Birth: 01/14/1976  Transition of [facility]) CM/SW [place], [place] Phone Number: 12/09/2020, 2:11 PM  Clinical Narrative:     CSW following for SNF placement. CSW will fax out referral once patient is closer to being medically ready for dc. Patient has cortrak. TOC will continue to follow.  Expected Discharge Plan: [place] Barriers to Discharge: Continued Medical Work up  Expected Discharge Plan and Services Expected Discharge Plan: [place] arrangements for the past 2 months: Single Family Home                                       Social Determinants of Health (SDOH) Interventions    Readmission Risk Interventions No flowsheet data found.

## 2020-12-09 NOTE — Progress Notes (Signed)
SLP Cancellation Note  Patient Details Name: Courtney Davies MRN: [id number] DOB: 04/15/1976   Cancelled treatment:       Reason Eval/Treat Not Completed: Other (comment). Pt not interactive for PO trials   Sloan Takagi, Katherene Ponto 12/09/2020, 2:19 PM

## 2020-12-09 NOTE — Progress Notes (Signed)
PROGRESS NOTE  Courtney Davies AQT:[id number] DOB: 14-Aug-1975 DOA: 12/01/2020 PCP: Pcp, No   LOS: 8 days   Brief narrative:  45 years old female with past medical history of hypertension, vitamin B12 deficiency, GERD, obstructive sleep apnea, presented to the hospital on 12/01/2020 with ventricular fibrillation for around 10 minutes.  Patient was initially admitted to the ICU for V. fib arrest on 12/01/20 and ECG showed no ischemic changes.  On 12/06/2020, patient was successfully extubated to nasal cannula.  Patient has history of COVID positive.  CTA was negative for pulmonary embolism.  Patient was subsequently considered stable for transfer out of the ICU.  Assessment/Plan:  Active Problems:   Cardiac arrest (HCC)   Type 1 diabetes mellitus without complication (HCC)   Hypertension   GERD (gastroesophageal reflux disease)   Anemia   Encephalopathy acute   Acute systolic heart failure (HCC)  Ventricular fibrillation arrest with acute respiratory failure.  History of COVID-19 and asthma.  Hypoxic encephalopathy.  Neurology recovery likely to take prolonged duration.  On cortrak tube tube for feeding.  Will need to make a decision on PEG tube if not improving with swallowing..  Will need ischemic work-up once neurological improvement.  Continue amiodarone, possible LifeVest if recovers.  Continue Coreg.  Acute systolic heart failure.  2D echocardiogram showed reduced LV function with LV ejection fraction of 40 to 45% with global hypokinesis..  Patient is on Coreg.  Dose of Coreg has been out titrated 6.25 milligrams twice daily and add losartan low-dose since 12/08/2020.  Atrial fibrillation On Lovenox subcu anticoagulation..Continue amiodarone, Coreg.  Ultrasound of the thyroid shows some nodules..  Aspiration pneumonitis. Right basilar infiltrate noted on the x-ray.  Has completed Rocephin.  Mild leukocytosis noted.  Debility, deconditioning.   PT OT on board.  Recommending skilled  nursing facility placement.  Nutrition.  On cortrak tube tube feeding.  Continue free water flushes.   DVT prophylaxis: SCDs Start: 12/01/20 1303, Lovenox subcu   Code Status: Full code  Family Communication:  I spoke with the patient's mother about the patient and also updated her about the potential need for PEG tube.  She wished me to talk with the patient's sister Ms. [name] but was unable to reach her today.   Status is: Inpatient  Remains inpatient appropriate because:IV treatments appropriate due to intensity of illness or inability to take PO and Inpatient level of care appropriate due to severity of illness  Dispo: The patient is from: Home              Anticipated d/c is to:  Undetermined at this time.              Patient currently is not medically stable to d/c.   Difficult to place patient Yes  Consultants: PCCM  Procedures: Intubation and mechanical ventilation.  Extubation Cortrak tube tube placement  Anti-infectives:  None at this time  Anti-infectives (From admission, onward)    Start     Dose/Rate Route Frequency Ordered Stop   12/04/20 1000  cefTRIAXone (ROCEPHIN) 2 g in sodium chloride 0.9 % 100 mL IVPB        2 g 200 mL/hr over 30 Minutes Intravenous Every 24 hours 12/04/20 0800 12/08/20 1934   12/03/20 1930  ceFEPIme (MAXIPIME) 2 g in sodium chloride 0.9 % 100 mL IVPB  Status:  Discontinued        2 g 200 mL/hr over 30 Minutes Intravenous Every 8 hours 12/03/20 1826 12/04/20 0800  12/02/20 0900  cefTRIAXone (ROCEPHIN) 2 g in sodium chloride 0.9 % 100 mL IVPB  Status:  Discontinued        2 g 200 mL/hr over 30 Minutes Intravenous Every 24 hours 12/02/20 0811 12/03/20 1811   12/01/20 1600  Ampicillin-Sulbactam (UNASYN) 3 g in sodium chloride 0.9 % 100 mL IVPB  Status:  Discontinued        3 g 200 mL/hr over 30 Minutes Intravenous Every 6 hours 12/01/20 1333 12/02/20 0811   12/01/20 1200  ceFEPIme (MAXIPIME) 2 g in sodium chloride 0.9 % 100 mL IVPB   Status:  Discontinued        2 g 200 mL/hr over 30 Minutes Intravenous Every 8 hours 12/01/20 1145 12/01/20 1332   12/01/20 1145  vancomycin (VANCOCIN) 2,000 mg in sodium chloride 0.9 % 500 mL IVPB  Status:  Discontinued        2,000 mg 250 mL/hr over 120 Minutes Intravenous  Once 12/01/20 1145 12/01/20 1332      Subjective: Today, patient was seen and examined at bedside.  No interval complaints reported. Objective: Vitals:   12/09/20 0747 12/09/20 1137  BP: (!) 155/111 (!) 145/80  Pulse: 88 86  Resp: 16 16  Temp: 98.4 F (36.9 C) 97.7 F (36.5 C)  SpO2: 96% 99%    Intake/Output Summary (Last 24 hours) at 12/09/2020 1141 Last data filed at 12/09/2020 0601 Gross per 24 hour  Intake 1310 ml  Output 950 ml  Net 360 ml    Filed Weights   12/07/20 0400 12/08/20 0500 12/09/20 0410  Weight: 102.1 kg 103.3 kg 105.1 kg   Body mass index is 37.4 kg/m.   Physical Exam: GENERAL: Patient is alert awake but not responsive to commands.  Not in obvious distress.  Obese HENT: No scleral pallor or icterus. Pupils equally reactive to light. Oral mucosa is moist.  Cortrak tube tube in place NECK: is supple, no gross swelling noted. CHEST: Clear to auscultation. No crackles or wheezes.  Diminished breath sounds bilaterally. CVS: S1 and S2 heard, no murmur. Regular rate and rhythm.  ABDOMEN: Soft, non-tender, bowel sounds are present. EXTREMITIES: No edema. CNS: Opens eyes spontaneously but no meaningful response.  Moves extremities spontaneously. SKIN: warm and dry without rashes.  Data Review: I have personally reviewed the following laboratory data and studies,  CBC: Recent Labs  Lab 12/04/20 0431 12/05/20 0426 12/06/20 0435 12/07/20 0510 12/08/20 0250  WBC 11.6* 9.7 12.7* 13.5* 14.5*  NEUTROABS  --   --   --   --  9.0*  HGB 11.6* 10.7* 10.4* 10.4* 11.6*  HCT 38.9 36.1 34.5* 34.0* 36.3  MCV 75.4* 75.2* 73.4* 72.2* 71.0*  PLT 233 202 238 262 384    Basic Metabolic  Panel: Recent Labs  Lab 12/02/20 1757 12/02/20 1757 12/03/20 0516 12/03/20 1815 12/04/20 0431 12/05/20 0426 12/06/20 0435 12/07/20 0510 12/08/20 0250 12/09/20 0217  NA  --    < > 138  --  140 141 143 147* 142 138  K  --    < > 3.5  --  4.4 4.5 3.9 3.6 4.1 3.9  CL  --    < > 112*  --  114* 115* 115* 109 110 105  CO2  --    < > 19*  --  20* 21* 22 25 23 25   GLUCOSE  --    < > 134*  --  137* 192* 182* 179* 180* 183*  BUN  --    < >  19  --  16 11 17 20 19 17   CREATININE  --    < > 1.40*  --  0.99 0.84 0.92 0.93 0.86 0.87  CALCIUM  --    < > 8.3*  --  8.6* 8.6* 8.7* 9.4 9.2 9.0  MG 2.4  --  2.4 2.2 2.1 2.1 1.9 1.8  --  1.8  PHOS 4.7*  --  4.4 3.4 3.7  --   --   --   --   --    < > = values in this interval not displayed.    Liver Function Tests: No results for input(s): AST, ALT, ALKPHOS, BILITOT, PROT, ALBUMIN in the last 168 hours.  No results for input(s): LIPASE, AMYLASE in the last 168 hours. No results for input(s): AMMONIA in the last 168 hours. Cardiac Enzymes: No results for input(s): CKTOTAL, CKMB, CKMBINDEX, TROPONINI in the last 168 hours. BNP (last 3 results) Recent Labs    12/01/20 0913  BNP 37.2     ProBNP (last 3 results) No results for input(s): PROBNP in the last 8760 hours.  CBG: Recent Labs  Lab 12/08/20 1944 12/09/20 0004 12/09/20 0358 12/09/20 0745 12/09/20 1135  GLUCAP 206* 193* 213* 175* 226*    Recent Results (from the past 240 hour(s))  Resp Panel by RT-PCR (Flu A&B, Covid) Nasopharyngeal Swab     Status: Abnormal   Collection Time: 12/01/20 10:08 AM   Specimen: Nasopharyngeal Swab; Nasopharyngeal(NP) swabs in vial transport medium  Result Value Ref Range Status   SARS Coronavirus 2 by RT PCR POSITIVE (A) NEGATIVE Final    Comment: RESULT CALLED TO, READ BACK BY AND VERIFIED WITH: EMILY DIXON 12/01/2020 @1138  BY JW (NOTE) SARS-CoV-2 target nucleic acids are DETECTED.  The SARS-CoV-2 RNA is generally detectable in upper  respiratory specimens during the acute phase of infection. Positive results are indicative of the presence of the identified virus, but do not rule out bacterial infection or co-infection with other pathogens not detected by the test. Clinical correlation with patient history and other diagnostic information is necessary to determine patient infection status. The expected result is Negative.  Fact Sheet for Patients: EntrepreneurPulse.com.au  Fact Sheet for Healthcare Providers: IncredibleEmployment.be  This test is not yet approved or cleared by the [place] FDA and  has been authorized for detection and/or diagnosis of SARS-CoV-2 by FDA under an Emergency Use Authorization (EUA).  This EUA will remain in effect (meaning this test can be  used) for the duration of  the COVID-19 declaration under Section 564(b)(1) of the Act, 21 U.S.C. section 360bbb-3(b)(1), unless the authorization is terminated or revoked sooner.     Influenza A by PCR NEGATIVE NEGATIVE Final   Influenza B by PCR NEGATIVE NEGATIVE Final    Comment: (NOTE) The Xpert Xpress SARS-CoV-2/FLU/RSV plus assay is intended as an aid in the diagnosis of influenza from Nasopharyngeal swab specimens and should not be used as a sole basis for treatment. Nasal washings and aspirates are unacceptable for Xpert Xpress SARS-CoV-2/FLU/RSV testing.  Fact Sheet for Patients: EntrepreneurPulse.com.au  Fact Sheet for Healthcare Providers: IncredibleEmployment.be  This test is not yet approved or cleared by the [place] FDA and has been authorized for detection and/or diagnosis of SARS-CoV-2 by FDA under an Emergency Use Authorization (EUA). This EUA will remain in effect (meaning this test can be used) for the duration of the COVID-19 declaration under Section 564(b)(1) of the Act, 21 U.S.C. section 360bbb-3(b)(1), unless the authorization is  terminated  or revoked.  Performed at [facility], [place] [address] [place], [place] [address]   Blood culture (routine x 2)     Status: None   Collection Time: 12/01/20 10:11 AM   Specimen: BLOOD LEFT HAND  Result Value Ref Range Status   Specimen Description BLOOD LEFT HAND  Final   Special Requests   Final    BOTTLES DRAWN AEROBIC ONLY Blood Culture results may not be optimal due to an inadequate volume of blood received in culture bottles   Culture   Final    NO GROWTH 5 DAYS Performed at [facility], [place] [address] [place], [place] [address]    Report Status 12/06/2020 FINAL  Final  Blood culture (routine x 2)     Status: None   Collection Time: 12/01/20 10:20 AM   Specimen: BLOOD RIGHT FOREARM  Result Value Ref Range Status   Specimen Description BLOOD RIGHT FOREARM  Final   Special Requests   Final    BOTTLES DRAWN AEROBIC AND ANAEROBIC Blood Culture adequate volume   Culture   Final    NO GROWTH 5 DAYS Performed at [facility], [place] [address], [place], [place] [address]    Report Status 12/06/2020 FINAL  Final  MRSA Next Gen by PCR, Nasal     Status: None   Collection Time: 12/02/20  6:11 AM   Specimen: Nasal Mucosa; Nasal Swab  Result Value Ref Range Status   MRSA by PCR Next Gen NOT DETECTED NOT DETECTED Final    Comment: (NOTE) The GeneXpert MRSA Assay (FDA approved for NASAL specimens only), is one component of a comprehensive MRSA colonization surveillance program. It is not intended to diagnose MRSA infection nor to guide or monitor treatment for MRSA infections. Test performance is not FDA approved in patients less than 71 years old. Performed at [facility], [place] [address], [place], [place] [address]   Culture, Respiratory w Gram Stain     Status: None   Collection Time: 12/03/20 12:10 AM   Specimen: Tracheal Aspirate; Respiratory  Result Value Ref Range Status   Specimen Description TRACHEAL ASPIRATE  Final   Special  Requests NONE  Final   Gram Stain   Final    ABUNDANT WBC PRESENT,BOTH PMN AND MONONUCLEAR NO ORGANISMS SEEN    Culture   Final    NO GROWTH 2 DAYS Performed at [facility], [address], [place],  [address]    Report Status 12/06/2020 FINAL  Final      Studies: No results found.    Flora Lipps, MD  [facility] 12/09/2020  If 7PM-7AM, please contact night-coverage

## 2020-12-09 NOTE — Progress Notes (Signed)
Progress Note  Patient Name: Courtney Davies Date of Encounter: 12/09/2020  [facility] Cardiologist: None   Subjective   No history obtainable.  The patient is awake.  Inpatient Medications    Scheduled Meds:  acetaminophen (TYLENOL) oral liquid 160 mg/5 mL  650 mg Per Tube Q6H   amiodarone  200 mg Per Tube BID   Followed by   Derrill Memo ON 12/10/2020] amiodarone  200 mg Per Tube Daily   carvedilol  6.25 mg Per Tube BID WC   Chlorhexidine Gluconate Cloth  6 each Topical Daily   enoxaparin (LOVENOX) injection  100 mg Subcutaneous Q12H   feeding supplement (PROSource TF)  45 mL Per Tube BID   free water  200 mL Per Tube Q4H   insulin aspart  0-20 Units Subcutaneous Q4H   losartan  12.5 mg Per Tube Daily   Continuous Infusions:  feeding supplement (VITAL 1.5 CAL) 1,000 mL (12/08/20 1247)   PRN Meds: albuterol, labetalol, ondansetron (ZOFRAN) IV   Vital Signs    Vitals:   12/09/20 0347 12/09/20 0410 12/09/20 0747 12/09/20 1137  BP: (!) 156/106  (!) 155/111 (!) 145/80  Pulse: 95  88 86  Resp:   16 16  Temp: 98.9 F (37.2 C)  98.4 F (36.9 C) 97.7 F (36.5 C)  TempSrc:   Axillary Axillary  SpO2: 100%  96% 99%  Weight:  105.1 kg    Height:        Intake/Output Summary (Last 24 hours) at 12/09/2020 1459 Last data filed at 12/09/2020 0601 Gross per 24 hour  Intake 860 ml  Output 750 ml  Net 110 ml   Last 3 Weights 12/09/2020 12/08/2020 12/07/2020  Weight (lbs) 231 lb 11.3 oz 227 lb 11.8 oz 225 lb 1.4 oz  Weight (kg) 105.1 kg 103.3 kg 102.1 kg      Telemetry    Sinus rhythm with short runs of SVT and short runs of NSVT - Personally Reviewed   Physical Exam  Awake, does not follow commands, nonverbal GEN: No acute distress.   Neck: No JVD Cardiac: RRR, no murmurs, rubs, or gallops.  Respiratory: Clear to auscultation bilaterally. GI: Soft, nontender, non-distended  MS: No edema; No deformity. Neuro: Appears to move all extremities, otherwise very difficult  to assess Psych: Unable to assess  Labs    High Sensitivity Troponin:   Recent Labs  Lab 12/01/20 0913 12/01/20 1706 12/02/20 0834  TROPONINIHS 65* 5,186* 3,074*      Chemistry Recent Labs  Lab 12/07/20 0510 12/08/20 0250 12/09/20 0217  NA 147* 142 138  K 3.6 4.1 3.9  CL 109 110 105  CO2 25 23 25   GLUCOSE 179* 180* 183*  BUN 20 19 17   CREATININE 0.93 0.86 0.87  CALCIUM 9.4 9.2 9.0  GFRNONAA >60 >60 >60  ANIONGAP 13 9 8      Hematology Recent Labs  Lab 12/06/20 0435 12/07/20 0510 12/08/20 0250  WBC 12.7* 13.5* 14.5*  RBC 4.70 4.71 5.11  HGB 10.4* 10.4* 11.6*  HCT 34.5* 34.0* 36.3  MCV 73.4* 72.2* 71.0*  MCH 22.1* 22.1* 22.7*  MCHC 30.1 30.6 32.0  RDW 17.1* 16.1* 15.7*  PLT 238 262 280    BNPNo results for input(s): BNP, PROBNP in the last 168 hours.   DDimer No results for input(s): DDIMER in the last 168 hours.   Radiology    No results found.    Patient Profile     45 y.o. female with ventricular fibrillation  out of hospital cardiac arrest and anoxic encephalopathy  Assessment & Plan    1.  Cardiac arrest: There is lots of uncertainty about the patient's cardiac arrest.  She did have ventricular fibrillation during the arrest and required multiple defibrillations.  There is some documentation that she may have had a respiratory event prior to this.  She did test positive for COVID.  She has had a few episodes of nonsustained VT of 7-8 beats over the last 24 hours.  She has also had some short supraventricular runs.  Recommend continuation of carvedilol and amiodarone.  Further cardiac testing is all on hold because of her significant neurologic dysfunction.  At this point she is not a candidate for any invasive cardiac procedures.  We will reassess her intermittently but it does not appear that she is going to have a quick recovery. 2.  Hypertension: Continue losartan, continue carvedilol.  Blood pressure is running above goal.  Will increase  antihypertensive medicines as tolerated.  Cardiology team will check back in with the patient next week.  Please call if any problems arise over the weekend.  For questions or updates, please contact [place] Please consult [email or web address] for contact info under        Signed, Sherren Mocha, MD  12/09/2020, 2:59 PM

## 2020-12-09 NOTE — Progress Notes (Signed)
[place] for enoxaparin Indication: a fib/ACS  Labs: Recent Labs    12/07/20 0510 12/08/20 0250 12/09/20 0217  HGB 10.4* 11.6*  --   HCT 34.0* 36.3  --   PLT 262 280  --   HEPARINUNFRC 0.27*  --   --   CREATININE 0.93 0.86 0.87    Assessment: 45yof admitted after VF arrest. Troponin elevated will be evaluated for ischemia.  Heparin switched to LMWH due to COVID status and stable renal function. Will consider transition to [place] once plan regarding PEG and other procedures is more clear. CBC done yesterday was stable. No bleeding issues noted.  Apixaban copay = $4  Goal of Therapy:  Anti-xa level 0.6-1.2 Monitor platelets by anticoagulation protocol: Yes   Plan:  Enoxaparin 100 mg subQ q 12h (52m/kg). CBC q72 hours - next check tomorrow 8/27  Thanks for allowing pharmacy to be a part of this patient's care.  FErin HearingPharmD., BCPS Clinical Pharmacist 12/09/2020 7:37 AM

## 2020-12-10 DIAGNOSIS — I5021 Acute systolic (congestive) heart failure: Secondary | ICD-10-CM | POA: Diagnosis not present

## 2020-12-10 DIAGNOSIS — D649 Anemia, unspecified: Secondary | ICD-10-CM | POA: Diagnosis not present

## 2020-12-10 DIAGNOSIS — G9340 Encephalopathy, unspecified: Secondary | ICD-10-CM | POA: Diagnosis not present

## 2020-12-10 DIAGNOSIS — I469 Cardiac arrest, cause unspecified: Secondary | ICD-10-CM | POA: Diagnosis not present

## 2020-12-10 LAB — CBC
HCT: 39.5 % (ref 36.0–46.0)
Hemoglobin: 12.6 g/dL (ref 12.0–15.0)
MCH: 22.5 pg — ABNORMAL LOW (ref 26.0–34.0)
MCHC: 31.9 g/dL (ref 30.0–36.0)
MCV: 70.4 fL — ABNORMAL LOW (ref 80.0–100.0)
Platelets: 327 10*3/uL (ref 150–400)
RBC: 5.61 MIL/uL — ABNORMAL HIGH (ref 3.87–5.11)
RDW: 15.9 % — ABNORMAL HIGH (ref 11.5–15.5)
WBC: 14.4 10*3/uL — ABNORMAL HIGH (ref 4.0–10.5)
nRBC: 0 % (ref 0.0–0.2)

## 2020-12-10 LAB — GLUCOSE, CAPILLARY
Glucose-Capillary: 197 mg/dL — ABNORMAL HIGH (ref 70–99)
Glucose-Capillary: 199 mg/dL — ABNORMAL HIGH (ref 70–99)
Glucose-Capillary: 202 mg/dL — ABNORMAL HIGH (ref 70–99)
Glucose-Capillary: 208 mg/dL — ABNORMAL HIGH (ref 70–99)
Glucose-Capillary: 235 mg/dL — ABNORMAL HIGH (ref 70–99)
Glucose-Capillary: 241 mg/dL — ABNORMAL HIGH (ref 70–99)

## 2020-12-10 LAB — BASIC METABOLIC PANEL
Anion gap: 10 (ref 5–15)
BUN: 18 mg/dL (ref 6–20)
CO2: 22 mmol/L (ref 22–32)
Calcium: 9.1 mg/dL (ref 8.9–10.3)
Chloride: 102 mmol/L (ref 98–111)
Creatinine, Ser: 0.78 mg/dL (ref 0.44–1.00)
GFR, Estimated: >60 mL/min (ref >60)
Glucose, Bld: 166 mg/dL — ABNORMAL HIGH (ref 70–99)
Potassium: 4.3 mmol/L (ref 3.5–5.1)
Sodium: 134 mmol/L — ABNORMAL LOW (ref 135–145)

## 2020-12-10 LAB — MAGNESIUM: Magnesium: 1.9 mg/dL (ref 1.7–2.4)

## 2020-12-10 NOTE — Progress Notes (Signed)
PROGRESS NOTE  Courtney Davies [id number] DOB: 03-25-1976 DOA: 12/01/2020 PCP: Pcp, No   LOS: 9 days   Brief narrative:  45 years old female with past medical history of hypertension, vitamin B12 deficiency, GERD, obstructive sleep apnea, presented to the hospital on 12/01/2020 with ventricular fibrillation for around 10 minutes.  Patient was initially admitted to the ICU for V. fib arrest on 12/01/20 and ECG showed no ischemic changes.  On 12/06/2020, patient was successfully extubated to nasal cannula.  Patient has history of COVID positive.  CTA was negative for pulmonary embolism.  Patient was subsequently considered stable for transfer out of the ICU.  Assessment/Plan:  Active Problems:   Cardiac arrest (HCC)   Type 1 diabetes mellitus without complication (HCC)   Hypertension   GERD (gastroesophageal reflux disease)   Anemia   Encephalopathy acute   Acute systolic heart failure (HCC)  Ventricular fibrillation arrest with acute respiratory failure.  History of COVID-19 and asthma.  Hypoxic encephalopathy.  Neurology recovery likely to take prolonged duration.  On cortrak tube tube for feeding.    Will need ischemic work-up once neurological improvement.  Continue amiodarone, possible LifeVest if recovers.  Continue Coreg.  Acute systolic heart failure.  2D echocardiogram showed reduced LV function with LV ejection fraction of 40 to 45% with global hypokinesis..  Patient is on Coreg.  Dose of Coreg has been out titrated 6.25 milligrams twice daily and add losartan low-dose since 12/08/2020.  Atrial fibrillation On Lovenox subcu anticoagulation..Continue amiodarone, Coreg.  Ultrasound of the thyroid shows some nodules..  Aspiration pneumonitis. Right basilar infiltrate noted on the x-ray.  Has completed Rocephin.  Mild leukocytosis noted.  Currently stable.  Debility, deconditioning.   PT OT on board.  Recommending skilled nursing facility placement.  Nutrition.  On cortrak  tube tube feeding.  Continue free water flushes.  We will discuss about a feeding tube placement if not improved.   DVT prophylaxis: SCDs Start: 12/01/20 1303, Lovenox subcu   Code Status: Full code  Family Communication:  None today.  I spoke with the patient's mother about the patient yesterday..  Status is: Inpatient  Remains inpatient appropriate because:IV treatments appropriate due to intensity of illness or inability to take PO and Inpatient level of care appropriate due to severity of illness  Dispo: The patient is from: Home              Anticipated d/c is to:  Undetermined at this time.              Patient currently is not medically stable to d/c.   Difficult to place patient Yes  Consultants: PCCM  Procedures: Intubation and mechanical ventilation.  Extubation Cortrak tube tube placement  Anti-infectives:  None at this time  Subjective: Today, patient was seen and examined at bedside.  No interval complaints reported.  Objective: Vitals:   12/10/20 0555 12/10/20 0700  BP:  (!) 132/104  Pulse:  84  Resp: (!) 22 20  Temp: 98.2 F (36.8 C) 98.3 F (36.8 C)  SpO2: 98%     Intake/Output Summary (Last 24 hours) at 12/10/2020 1127 Last data filed at 12/09/2020 1851 Gross per 24 hour  Intake 410 ml  Output 600 ml  Net -190 ml    Filed Weights   12/07/20 0400 12/08/20 0500 12/09/20 0410  Weight: 102.1 kg 103.3 kg 105.1 kg   Body mass index is 37.4 kg/m.   Physical Exam:  General: Alert awake but not responsive, opens eyes  spontaneously, obese withdraws to painful stimulus, HENT:   No scleral pallor or icterus noted. Oral mucosa is moist.  Cortrak tube tube in place. Chest:  Clear breath sounds.  Diminished breath sounds bilaterally. No crackles or wheezes.  CVS: S1 &S2 heard. No murmur.  Regular rate and rhythm. Abdomen: Soft, nontender, nondistended.  Bowel sounds are heard.   Extremities: No cyanosis, clubbing or edema.  Peripheral pulses are  palpable. Psych: Opens eyes spontaneously but no meaningful response.  Withdraws to painful stimulus. CNS:  No cranial nerve deficits.  Power equal in all extremities.   Skin: Warm and dry.  No rashes noted.  Data Review: I have personally reviewed the following laboratory data and studies,  CBC: Recent Labs  Lab 12/05/20 0426 12/06/20 0435 12/07/20 0510 12/08/20 0250 12/10/20 0316  WBC 9.7 12.7* 13.5* 14.5* 14.4*  NEUTROABS  --   --   --  9.0*  --   HGB 10.7* 10.4* 10.4* 11.6* 12.6  HCT 36.1 34.5* 34.0* 36.3 39.5  MCV 75.2* 73.4* 72.2* 71.0* 70.4*  PLT 202 238 262 280 326    Basic Metabolic Panel: Recent Labs  Lab 12/03/20 1815 12/03/20 1815 12/04/20 0431 12/05/20 0426 12/06/20 0435 12/07/20 0510 12/08/20 0250 12/09/20 0217 12/10/20 0316  NA  --    < > 140 141 143 147* 142 138 134*  K  --    < > 4.4 4.5 3.9 3.6 4.1 3.9 4.3  CL  --    < > 114* 115* 115* 109 110 105 102  CO2  --    < > 20* 21* 22 25 23 25 22   GLUCOSE  --    < > 137* 192* 182* 179* 180* 183* 166*  BUN  --    < > 16 11 17 20 19 17 18   CREATININE  --    < > 0.99 0.84 0.92 0.93 0.86 0.87 0.78  CALCIUM  --    < > 8.6* 8.6* 8.7* 9.4 9.2 9.0 9.1  MG 2.2  --  2.1 2.1 1.9 1.8  --  1.8 1.9  PHOS 3.4  --  3.7  --   --   --   --   --   --    < > = values in this interval not displayed.    Liver Function Tests: No results for input(s): AST, ALT, ALKPHOS, BILITOT, PROT, ALBUMIN in the last 168 hours.  No results for input(s): LIPASE, AMYLASE in the last 168 hours. No results for input(s): AMMONIA in the last 168 hours. Cardiac Enzymes: No results for input(s): CKTOTAL, CKMB, CKMBINDEX, TROPONINI in the last 168 hours. BNP (last 3 results) Recent Labs    12/01/20 0913  BNP 37.2     ProBNP (last 3 results) No results for input(s): PROBNP in the last 8760 hours.  CBG: Recent Labs  Lab 12/09/20 1602 12/09/20 2110 12/10/20 0057 12/10/20 0458 12/10/20 0734  GLUCAP 182* 190* 197* 199* 241*     Recent Results (from the past 240 hour(s))  Resp Panel by RT-PCR (Flu A&B, Covid) Nasopharyngeal Swab     Status: Abnormal   Collection Time: 12/01/20 10:08 AM   Specimen: Nasopharyngeal Swab; Nasopharyngeal(NP) swabs in vial transport medium  Result Value Ref Range Status   SARS Coronavirus 2 by RT PCR POSITIVE (A) NEGATIVE Final    Comment: RESULT CALLED TO, READ BACK BY AND VERIFIED WITH: EMILY DIXON 12/01/2020 @1138  BY JW (NOTE) SARS-CoV-2 target nucleic acids are DETECTED.  The SARS-CoV-2  RNA is generally detectable in upper respiratory specimens during the acute phase of infection. Positive results are indicative of the presence of the identified virus, but do not rule out bacterial infection or co-infection with other pathogens not detected by the test. Clinical correlation with patient history and other diagnostic information is necessary to determine patient infection status. The expected result is Negative.  Fact Sheet for Patients: EntrepreneurPulse.com.au  Fact Sheet for Healthcare Providers: IncredibleEmployment.be  This test is not yet approved or cleared by the [place] FDA and  has been authorized for detection and/or diagnosis of SARS-CoV-2 by FDA under an Emergency Use Authorization (EUA).  This EUA will remain in effect (meaning this test can be  used) for the duration of  the COVID-19 declaration under Section 564(b)(1) of the Act, 21 U.S.C. section 360bbb-3(b)(1), unless the authorization is terminated or revoked sooner.     Influenza A by PCR NEGATIVE NEGATIVE Final   Influenza B by PCR NEGATIVE NEGATIVE Final    Comment: (NOTE) The Xpert Xpress SARS-CoV-2/FLU/RSV plus assay is intended as an aid in the diagnosis of influenza from Nasopharyngeal swab specimens and should not be used as a sole basis for treatment. Nasal washings and aspirates are unacceptable for Xpert Xpress SARS-CoV-2/FLU/RSV testing.  Fact  Sheet for Patients: EntrepreneurPulse.com.au  Fact Sheet for Healthcare Providers: IncredibleEmployment.be  This test is not yet approved or cleared by the [place] FDA and has been authorized for detection and/or diagnosis of SARS-CoV-2 by FDA under an Emergency Use Authorization (EUA). This EUA will remain in effect (meaning this test can be used) for the duration of the COVID-19 declaration under Section 564(b)(1) of the Act, 21 U.S.C. section 360bbb-3(b)(1), unless the authorization is terminated or revoked.  Performed at [facility], [place] [address] [place], [place] [address]   Blood culture (routine x 2)     Status: None   Collection Time: 12/01/20 10:11 AM   Specimen: BLOOD LEFT HAND  Result Value Ref Range Status   Specimen Description BLOOD LEFT HAND  Final   Special Requests   Final    BOTTLES DRAWN AEROBIC ONLY Blood Culture results may not be optimal due to an inadequate volume of blood received in culture bottles   Culture   Final    NO GROWTH 5 DAYS Performed at [facility], [place] [address], [place], [place] [address]    Report Status 12/06/2020 FINAL  Final  Blood culture (routine x 2)     Status: None   Collection Time: 12/01/20 10:20 AM   Specimen: BLOOD RIGHT FOREARM  Result Value Ref Range Status   Specimen Description BLOOD RIGHT FOREARM  Final   Special Requests   Final    BOTTLES DRAWN AEROBIC AND ANAEROBIC Blood Culture adequate volume   Culture   Final    NO GROWTH 5 DAYS Performed at [facility], [place] [address], [place], [place] [address]    Report Status 12/06/2020 FINAL  Final  MRSA Next Gen by PCR, Nasal     Status: None   Collection Time: 12/02/20  6:11 AM   Specimen: Nasal Mucosa; Nasal Swab  Result Value Ref Range Status   MRSA by PCR Next Gen NOT DETECTED NOT DETECTED Final    Comment: (NOTE) The GeneXpert MRSA Assay (FDA approved for NASAL specimens only), is one component of a  comprehensive MRSA colonization surveillance program. It is not intended to diagnose MRSA infection nor to guide or monitor treatment  for MRSA infections. Test performance is not FDA approved in patients less than 102 years old. Performed at [facility], [place] [address] [place], [place] [address]   Culture, Respiratory w Gram Stain     Status: None   Collection Time: 12/03/20 12:10 AM   Specimen: Tracheal Aspirate; Respiratory  Result Value Ref Range Status   Specimen Description TRACHEAL ASPIRATE  Final   Special Requests NONE  Final   Gram Stain   Final    ABUNDANT WBC PRESENT,BOTH PMN AND MONONUCLEAR NO ORGANISMS SEEN    Culture   Final    NO GROWTH 2 DAYS Performed at [facility], [address] [place], [place] [address]    Report Status 12/06/2020 FINAL  Final      Studies: No results found.    Flora Lipps, MD  [facility] 12/10/2020  If 7PM-7AM, please contact night-coverage

## 2020-12-10 NOTE — Progress Notes (Signed)
  Speech Language Pathology Treatment: Dysphagia  Patient Details Name: Courtney Davies MRN: [id number] DOB: 18-Feb-1976 Today's Date: 12/10/2020 Time: 2542-7062 SLP Time Calculation (min) (ACUTE ONLY): 16 min  Assessment / Plan / Recommendation Clinical Impression  Neeti was awake for session, making eye contact with therapist. She did not attempt to follow commands or participate in feeding (removed mitt) with cup or spoon given the opportunity. Volitional oral acceptance not present and water piped in with end of straw and small amount applesauce past lips. She spat out all consistencies attempted. She did have baseline cough noted throughout session. Therapist was unable to facilitate acceptance. 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 Therapist noted discussion in chart for PEG which appears to be the right direction for now. ST will continue efforts.    HPI HPI: Pt is a 45 y/o female admitted 8/18 with Vfib arrest with ROSC, COVID. CTA negative for PE. ETT 8/18-23 (failed inital attempt to extubate 8/19). MRI 8/21 consistent with hypoxic/ischemic injury. CXR 8/21 RLL infiltrate. PMT 8/23: continued aggressive care; family very hopeful for continued improvement. PMH: B12 deficiency, DM, OA, bronchitis.      SLP Plan  Continue with current plan of care       Recommendations  Diet recommendations: NPO Medication Administration: Via alternative means                Oral Care Recommendations: Oral care QID Follow up Recommendations: Skilled Nursing facility SLP Visit Diagnosis: Dysphagia, unspecified (R13.10) Plan: Continue with current plan of care       GO                Houston Siren 12/10/2020, 11:34 AM

## 2020-12-10 NOTE — Progress Notes (Signed)
[place] for enoxaparin Indication: a fib/ACS  Labs: Recent Labs    12/08/20 0250 12/09/20 0217 12/10/20 0316  HGB 11.6*  --  12.6  HCT 36.3  --  39.5  PLT 280  --  327  CREATININE 0.86 0.87 0.78    Assessment: 45yof admitted after VF arrest. Troponin elevated will be evaluated for ischemia.  Heparin switched to LMWH due to COVID status and stable renal function. Will consider transition to [place] once plan regarding PEG and other procedures is more clear. CBC done today and continues to be stable. Renal function stable. Dose is appropriate for weight. No bleeding issues noted.  Per cardiology, further cardiac testing is on hold due to significant neurological dysfunction. At this point, she is not a candidate for any invasive cardiac procedures.  Apixaban copay = $4  Goal of Therapy:  Anti-xa level 0.6-1.2 Monitor platelets by anticoagulation protocol: Yes   Plan:  Continue enoxaparin 100 mg subQ q 12h (67m/kg). CBC q72 hours, next 8/30 Monitor weight and renal function   Thanks for allowing pharmacy to be a part of this patient's care.  RPauletta Browns Pharm.D. PGY-1 Ambulatory Care Resident PFEOFH:[phone]/27/2022 9:41 AM

## 2020-12-11 ENCOUNTER — Inpatient Hospital Stay (HOSPITAL_COMMUNITY): Payer: Medicaid Other

## 2020-12-11 DIAGNOSIS — G9340 Encephalopathy, unspecified: Secondary | ICD-10-CM | POA: Diagnosis not present

## 2020-12-11 DIAGNOSIS — I469 Cardiac arrest, cause unspecified: Secondary | ICD-10-CM | POA: Diagnosis not present

## 2020-12-11 DIAGNOSIS — I5021 Acute systolic (congestive) heart failure: Secondary | ICD-10-CM | POA: Diagnosis not present

## 2020-12-11 DIAGNOSIS — D649 Anemia, unspecified: Secondary | ICD-10-CM | POA: Diagnosis not present

## 2020-12-11 LAB — BASIC METABOLIC PANEL
Anion gap: 8 (ref 5–15)
BUN: 18 mg/dL (ref 6–20)
CO2: 21 mmol/L — ABNORMAL LOW (ref 22–32)
Calcium: 9.1 mg/dL (ref 8.9–10.3)
Chloride: 104 mmol/L (ref 98–111)
Creatinine, Ser: 0.87 mg/dL (ref 0.44–1.00)
GFR, Estimated: >60 mL/min (ref >60)
Glucose, Bld: 196 mg/dL — ABNORMAL HIGH (ref 70–99)
Potassium: 4.6 mmol/L (ref 3.5–5.1)
Sodium: 133 mmol/L — ABNORMAL LOW (ref 135–145)

## 2020-12-11 LAB — GLUCOSE, CAPILLARY
Glucose-Capillary: 150 mg/dL — ABNORMAL HIGH (ref 70–99)
Glucose-Capillary: 172 mg/dL — ABNORMAL HIGH (ref 70–99)
Glucose-Capillary: 195 mg/dL — ABNORMAL HIGH (ref 70–99)
Glucose-Capillary: 213 mg/dL — ABNORMAL HIGH (ref 70–99)
Glucose-Capillary: 234 mg/dL — ABNORMAL HIGH (ref 70–99)

## 2020-12-11 LAB — MAGNESIUM: Magnesium: 1.8 mg/dL (ref 1.7–2.4)

## 2020-12-11 NOTE — Progress Notes (Addendum)
PROGRESS NOTE  Courtney Davies HGD:[id number] DOB: [birth year] DOA: 12/01/2020 PCP: Pcp, No   LOS: 10 days   Brief narrative:  45 years old female with past medical history of hypertension, vitamin B12 deficiency, GERD, obstructive sleep apnea, presented to the hospital on 12/01/2020 with ventricular fibrillation for around 10 minutes.  Patient was initially admitted to the ICU for V. fib arrest on 12/01/20 and ECG showed no ischemic changes.  On 12/06/2020, patient was successfully extubated to nasal cannula.  Patient has history of COVID positive.  CTA was negative for pulmonary embolism.  Patient was subsequently considered stable for transfer out of the ICU.  Assessment/Plan:  Active Problems:   Cardiac arrest (HCC)   Type 1 diabetes mellitus without complication (HCC)   Hypertension   GERD (gastroesophageal reflux disease)   Anemia   Encephalopathy acute   Acute systolic heart failure (HCC)  Ventricular fibrillation arrest with acute respiratory failure.  History of COVID-19 and asthma.  Hypoxic encephalopathy. Neurology recovery likely to take prolonged duration.  On cortrak tube tube for feeding.    Will need ischemic work-up once neurological improvement.  Continue amiodarone.  Continue Coreg.  Acute systolic heart failure.  2D echocardiogram showed reduced LV function with LV ejection fraction of 40 to 45% with global hypokinesis..  Patient is on Coreg.  Dose of Coreg has been out titrated 6.25 milligrams twice daily and add losartan low-dose since 12/08/2020.  Atrial fibrillation On Lovenox subcu anticoagulation. Continue amiodarone, Coreg.  Ultrasound of the thyroid shows some nodules..  Aspiration pneumonitis. Right basilar infiltrate noted on the x-ray.  Has completed Rocephin.  Mild leukocytosis noted.  Currently stable.  Debility, deconditioning.   PT OT on board.  Recommending skilled nursing facility placement.  Nutrition.  On cortrak tube tube feeding.  Continue free  water flushes.  Spoke with the patient's sister on the phone today and they are willing to proceed with the PEG tube.  Spoke with the speech therapist and it is unlikely that patient will be able to safely swallow in the next several days.    DVT prophylaxis: SCDs Start: 12/01/20 1303, Lovenox subcu   Code Status: Full code  Family Communication:   I spoke with the patient's sister Ms [name] today about the plan for follow-up and PEG tube placement.  She is in agreement for PEG tube placement.  Status is: Inpatient  Remains inpatient appropriate because:IV treatments appropriate due to intensity of illness or inability to take PO and Inpatient level of care appropriate due to severity of illness  Dispo: The patient is from: Home              Anticipated d/c is to:  Undetermined at this time.              Patient currently is not medically stable to d/c.   Difficult to place patient Yes  Consultants: PCCM  Procedures: Intubation and mechanical ventilation.  Extubation Cortrak tube tube placement  Anti-infectives:  None at this time  Subjective: Today, patient was seen and examined at bedside.  No interval complaints reported by the nursing staff.  Patient is nonverbal.  Objective: Vitals:   12/11/20 0503 12/11/20 0816  BP: 111/73 125/81  Pulse: 89 89  Resp: 17 17  Temp: 98.4 F (36.9 C) 98.8 F (37.1 C)  SpO2: 98% 96%    Intake/Output Summary (Last 24 hours) at 12/11/2020 1130 Last data filed at 12/11/2020 0037 Gross per 24 hour  Intake 30 ml  Output 2200 ml  Net -2170 ml    Filed Weights   12/08/20 0500 12/09/20 0410 12/11/20 0200  Weight: 103.3 kg 105.1 kg 106 kg   Body mass index is 37.72 kg/m.   Physical Exam:  General: Alert awake but not responsive, opens eyes spontaneously, obese, withdraws to painful stimulus  HENT:   No scleral pallor or icterus noted. Oral mucosa is moist.  Cortrak tube tube in place. Chest:  Clear breath sounds.  Diminished  breath sounds bilaterally. No crackles or wheezes.  CVS: S1 &S2 heard. No murmur.  Regular rate and rhythm. Abdomen: Soft, nontender, nondistended.  Bowel sounds are heard.   Extremities: No cyanosis, clubbing or edema.  Peripheral pulses are palpable. Psych: Opens eyes spontaneously but no meaningful response.  Withdraws to painful stimulus. CNS: Alert awake, moves extremities Skin: Warm and dry.  No rashes noted.  Data Review: I have personally reviewed the following laboratory data and studies,  CBC: Recent Labs  Lab 12/05/20 0426 12/06/20 0435 12/07/20 0510 12/08/20 0250 12/10/20 0316  WBC 9.7 12.7* 13.5* 14.5* 14.4*  NEUTROABS  --   --   --  9.0*  --   HGB 10.7* 10.4* 10.4* 11.6* 12.6  HCT 36.1 34.5* 34.0* 36.3 39.5  MCV 75.2* 73.4* 72.2* 71.0* 70.4*  PLT 202 238 262 280 401    Basic Metabolic Panel: Recent Labs  Lab 12/06/20 0435 12/07/20 0510 12/08/20 0250 12/09/20 0217 12/10/20 0316 12/11/20 0551  NA 143 147* 142 138 134* 133*  K 3.9 3.6 4.1 3.9 4.3 4.6  CL 115* 109 110 105 102 104  CO2 22 25 23 25 22  21*  GLUCOSE 182* 179* 180* 183* 166* 196*  BUN 17 20 19 17 18 18   CREATININE 0.92 0.93 0.86 0.87 0.78 0.87  CALCIUM 8.7* 9.4 9.2 9.0 9.1 9.1  MG 1.9 1.8  --  1.8 1.9 1.8    Liver Function Tests: No results for input(s): AST, ALT, ALKPHOS, BILITOT, PROT, ALBUMIN in the last 168 hours.  No results for input(s): LIPASE, AMYLASE in the last 168 hours. No results for input(s): AMMONIA in the last 168 hours. Cardiac Enzymes: No results for input(s): CKTOTAL, CKMB, CKMBINDEX, TROPONINI in the last 168 hours. BNP (last 3 results) Recent Labs    12/01/20 0913  BNP 37.2     ProBNP (last 3 results) No results for input(s): PROBNP in the last 8760 hours.  CBG: Recent Labs  Lab 12/10/20 1559 12/10/20 2026 12/11/20 0035 12/11/20 0502 12/11/20 0814  GLUCAP 202* 208* 150* 195* 213*    Recent Results (from the past 240 hour(s))  MRSA Next Gen by PCR,  Nasal     Status: None   Collection Time: 12/02/20  6:11 AM   Specimen: Nasal Mucosa; Nasal Swab  Result Value Ref Range Status   MRSA by PCR Next Gen NOT DETECTED NOT DETECTED Final    Comment: (NOTE) The GeneXpert MRSA Assay (FDA approved for NASAL specimens only), is one component of a comprehensive MRSA colonization surveillance program. It is not intended to diagnose MRSA infection nor to guide or monitor treatment for MRSA infections. Test performance is not FDA approved in patients less than 69 years old. Performed at [facility], [place] [address] [place], [place] [address]   Culture, Respiratory w Gram Stain     Status: None   Collection Time: 12/03/20 12:10 AM   Specimen: Tracheal Aspirate; Respiratory  Result Value Ref Range Status   Specimen Description TRACHEAL  ASPIRATE  Final   Special Requests NONE  Final   Gram Stain   Final    ABUNDANT WBC PRESENT,BOTH PMN AND MONONUCLEAR NO ORGANISMS SEEN    Culture   Final    NO GROWTH 2 DAYS Performed at [facility], [address], [place], [place] [address]    Report Status 12/06/2020 FINAL  Final      Studies: No results found.    Flora Lipps, MD  [facility] 12/11/2020  If 7PM-7AM, please contact night-coverage

## 2020-12-11 NOTE — Progress Notes (Signed)
[place] for enoxaparin Indication: a fib/ACS  Labs: Recent Labs    12/09/20 0217 12/10/20 0316 12/11/20 0551  HGB  --  12.6  --   HCT  --  39.5  --   PLT  --  327  --   CREATININE 0.87 0.78 0.87    Assessment: 45yof admitted after VF arrest. Heparin switched to LMWH due to COVID status and stable renal function. Will consider transition to [place] once plan regarding PEG and other procedures is more clear. Neurology recovery likely to take prolonged duration.  Last CBC was yesterday and was stable. Renal function remains stable. Patient's weight has been increasing (100 kg upon admission, 106 kg today). Weight does not warrant a dose change at this time, but continue to monitor and adjust dose as appropriate.   Apixaban copay = $4  Goal of Therapy:  Anti-xa level 0.6-1.2 Monitor platelets by anticoagulation protocol: Yes   Plan:  Continue enoxaparin 100 mg subQ q 12h (23m/kg). CBC q72 hours, next 8/30 Monitor weight and renal function   Thanks for allowing pharmacy to be a part of this patient's care.  RPauletta Browns Pharm.D. PGY-1 Ambulatory Care Resident PIDHWY:[phone]/28/2022 9:32 AM

## 2020-12-12 DIAGNOSIS — I469 Cardiac arrest, cause unspecified: Secondary | ICD-10-CM | POA: Diagnosis not present

## 2020-12-12 DIAGNOSIS — G9340 Encephalopathy, unspecified: Secondary | ICD-10-CM | POA: Diagnosis not present

## 2020-12-12 DIAGNOSIS — D649 Anemia, unspecified: Secondary | ICD-10-CM | POA: Diagnosis not present

## 2020-12-12 DIAGNOSIS — I5021 Acute systolic (congestive) heart failure: Secondary | ICD-10-CM | POA: Diagnosis not present

## 2020-12-12 DIAGNOSIS — I472 Ventricular tachycardia: Secondary | ICD-10-CM | POA: Diagnosis not present

## 2020-12-12 LAB — GLUCOSE, CAPILLARY
Glucose-Capillary: 168 mg/dL — ABNORMAL HIGH (ref 70–99)
Glucose-Capillary: 219 mg/dL — ABNORMAL HIGH (ref 70–99)
Glucose-Capillary: 286 mg/dL — ABNORMAL HIGH (ref 70–99)
Glucose-Capillary: 159 mg/dL — ABNORMAL HIGH (ref 70–99)
Glucose-Capillary: 190 mg/dL — ABNORMAL HIGH (ref 70–99)

## 2020-12-12 LAB — BASIC METABOLIC PANEL
Anion gap: 7 (ref 5–15)
BUN: 20 mg/dL (ref 6–20)
CO2: 22 mmol/L (ref 22–32)
Calcium: 9.1 mg/dL (ref 8.9–10.3)
Chloride: 105 mmol/L (ref 98–111)
Creatinine, Ser: 0.89 mg/dL (ref 0.44–1.00)
GFR, Estimated: >60 mL/min (ref >60)
Glucose, Bld: 214 mg/dL — ABNORMAL HIGH (ref 70–99)
Potassium: 4.6 mmol/L (ref 3.5–5.1)
Sodium: 134 mmol/L — ABNORMAL LOW (ref 135–145)

## 2020-12-12 LAB — CBC
HCT: 41 % (ref 36.0–46.0)
Hemoglobin: 12.9 g/dL (ref 12.0–15.0)
MCH: 22.7 pg — ABNORMAL LOW (ref 26.0–34.0)
MCHC: 31.5 g/dL (ref 30.0–36.0)
MCV: 72.1 fL — ABNORMAL LOW (ref 80.0–100.0)
Platelets: 324 10*3/uL (ref 150–400)
RBC: 5.69 MIL/uL — ABNORMAL HIGH (ref 3.87–5.11)
RDW: 16.3 % — ABNORMAL HIGH (ref 11.5–15.5)
WBC: 13.1 10*3/uL — ABNORMAL HIGH (ref 4.0–10.5)
nRBC: 0 % (ref 0.0–0.2)

## 2020-12-12 LAB — MAGNESIUM: Magnesium: 1.8 mg/dL (ref 1.7–2.4)

## 2020-12-12 MED ORDER — CEFAZOLIN SODIUM-DEXTROSE 2-4 GM/100ML-% IV SOLN
2.0000 g | INTRAVENOUS | Status: AC
  Administered 2020-12-13: 2 g via INTRAVENOUS
  Filled 2020-12-12: qty 100

## 2020-12-12 MED ORDER — LORAZEPAM 2 MG/ML IJ SOLN
1.0000 mg | Freq: Once | INTRAMUSCULAR | Status: AC | PRN
  Administered 2020-12-12: 1 mg via INTRAVENOUS
  Filled 2020-12-12: qty 1

## 2020-12-12 MED ORDER — IOHEXOL 300 MG/ML  SOLN
75.0000 mL | Freq: Once | INTRAMUSCULAR | Status: AC | PRN
  Administered 2020-12-12: 75 mL
  Filled 2020-12-12: qty 100
  Filled 2020-12-12: qty 75
  Filled 2020-12-12: qty 150
  Filled 2020-12-12: qty 100

## 2020-12-12 NOTE — Progress Notes (Signed)
PROGRESS NOTE  Courtney Davies MHD:[id number] DOB: 1975-09-05 DOA: 12/01/2020 PCP: Pcp, No   LOS: 11 days   Brief narrative:  45 years old female with past medical history of hypertension, vitamin B12 deficiency, GERD, obstructive sleep apnea, presented to the hospital on 12/01/2020 with ventricular fibrillation for around 10 minutes.  Patient was initially admitted to the ICU for V. fib arrest on 12/01/20 and ECG showed no ischemic changes.  On 12/06/2020, patient was successfully extubated to nasal cannula.  Patient has history of COVID positive.  CTA was negative for pulmonary embolism.  Patient was subsequently considered stable for transfer out of the ICU.  Assessment/Plan:  Active Problems:   Cardiac arrest (HCC)   Type 1 diabetes mellitus without complication (HCC)   Hypertension   GERD (gastroesophageal reflux disease)   Anemia   Encephalopathy acute   Acute systolic heart failure (HCC)  Ventricular fibrillation arrest with acute respiratory failure.  History of COVID-19 and asthma.  Hypoxic encephalopathy. Neurology recovery likely to take prolonged duration.    Will need ischemic work-up once neurological improvement.  Continue amiodarone,Coreg.  Acute systolic heart failure.  Currently compensated.  2D echocardiogram showed reduced LV function with LV ejection fraction of 40 to 45% with global hypokinesis..  Patient is on Coreg and losartan   Atrial fibrillation On Lovenox subcu anticoagulation. Continue amiodarone, Coreg.  Ultrasound of the thyroid shows some nodules..  Aspiration pneumonitis. Right basilar infiltrate noted on the x-ray.  Has completed Rocephin.  Mild leukocytosis noted  Debility, deconditioning.   PT, OT on board.  Recommending skilled nursing facility placement.  Nutrition.  On cortrak tube tube feeding.  Considering PEG tube placement at this time  DVT prophylaxis: SCDs Start: 12/01/20 1303, Lovenox subcu   Code Status: Full code  Family  Communication:  None today.  Spoke with the patient's sister yesterday  Status is: Inpatient  Remains inpatient appropriate because:IV treatments appropriate due to intensity of illness or inability to take PO and Inpatient level of care appropriate due to severity of illness  Dispo: The patient is from: Home              Anticipated d/c is to:  Undetermined at this time.              Patient currently is not medically stable to d/c.   Difficult to place patient Yes  Consultants: PCCM  Procedures: Intubation and mechanical ventilation.  Extubation Cortrak tube tube placement  Anti-infectives:  None at this time  Subjective: Today, patient was seen and examined at bedside.  Patient was little more agitated and aggravated yesterday and had taken off the move mittens and had tried to pull the cortrak tube.  Objective: Vitals:   12/12/20 0639 12/12/20 0859  BP:  (!) 139/109  Pulse:  95  Resp: 18   Temp: 98.4 F (36.9 C)   SpO2:      Intake/Output Summary (Last 24 hours) at 12/12/2020 1319 Last data filed at 12/12/2020 1100 Gross per 24 hour  Intake --  Output 900 ml  Net -900 ml    Filed Weights   12/09/20 0410 12/11/20 0200 12/12/20 0500  Weight: 105.1 kg 106 kg 102.1 kg   Body mass index is 36.33 kg/m.   Physical Exam:  General:   not in obvious distress, opens eyes spontaneously, obese, withdraws to painful stimulus, spontaneously moving extremities HENT:   No scleral pallor or icterus noted. Oral mucosa is moist.  Tube in place Chest:  Clear breath sounds.  Diminished breath sounds bilaterally. No crackles or wheezes.  CVS: S1 &S2 heard. No murmur.  Regular rate and rhythm. Abdomen: Soft, nontender, nondistended.  Bowel sounds are heard.   Extremities: No cyanosis, clubbing or edema.  Peripheral pulses are palpable. Psych: Alert, awake, opens eyes spontaneously, withdraws extremities CNS: Alert awake moves extremities, Skin: Warm and dry.  No rashes  noted.   Data Review: I have personally reviewed the following laboratory data and studies,  CBC: Recent Labs  Lab 12/06/20 0435 12/07/20 0510 12/08/20 0250 12/10/20 0316 12/12/20 0743  WBC 12.7* 13.5* 14.5* 14.4* 13.1*  NEUTROABS  --   --  9.0*  --   --   HGB 10.4* 10.4* 11.6* 12.6 12.9  HCT 34.5* 34.0* 36.3 39.5 41.0  MCV 73.4* 72.2* 71.0* 70.4* 72.1*  PLT 238 262 280 327 725    Basic Metabolic Panel: Recent Labs  Lab 12/07/20 0510 12/08/20 0250 12/09/20 0217 12/10/20 0316 12/11/20 0551 12/12/20 0743  NA 147* 142 138 134* 133* 134*  K 3.6 4.1 3.9 4.3 4.6 4.6  CL 109 110 105 102 104 105  CO2 25 23 25 22  21* 22  GLUCOSE 179* 180* 183* 166* 196* 214*  BUN 20 19 17 18 18 20   CREATININE 0.93 0.86 0.87 0.78 0.87 0.89  CALCIUM 9.4 9.2 9.0 9.1 9.1 9.1  MG 1.8  --  1.8 1.9 1.8 1.8    Liver Function Tests: No results for input(s): AST, ALT, ALKPHOS, BILITOT, PROT, ALBUMIN in the last 168 hours.  No results for input(s): LIPASE, AMYLASE in the last 168 hours. No results for input(s): AMMONIA in the last 168 hours. Cardiac Enzymes: No results for input(s): CKTOTAL, CKMB, CKMBINDEX, TROPONINI in the last 168 hours. BNP (last 3 results) Recent Labs    12/01/20 0913  BNP 37.2     ProBNP (last 3 results) No results for input(s): PROBNP in the last 8760 hours.  CBG: Recent Labs  Lab 12/11/20 1227 12/11/20 2336 12/12/20 0631 12/12/20 0721 12/12/20 1053  GLUCAP 172* 234* 168* 219* 286*    Recent Results (from the past 240 hour(s))  Culture, Respiratory w Gram Stain     Status: None   Collection Time: 12/03/20 12:10 AM   Specimen: Tracheal Aspirate; Respiratory  Result Value Ref Range Status   Specimen Description TRACHEAL ASPIRATE  Final   Special Requests NONE  Final   Gram Stain   Final    ABUNDANT WBC PRESENT,BOTH PMN AND MONONUCLEAR NO ORGANISMS SEEN    Culture   Final    NO GROWTH 2 DAYS Performed at [facility], [address],  [place], [place] [address]    Report Status 12/06/2020 FINAL  Final      Studies: CT ABDOMEN WO CONTRAST  Result Date: 12/12/2020 CLINICAL DATA:  Dysphasia, preop planning for gastrostomy tube EXAM: CT ABDOMEN WITHOUT CONTRAST TECHNIQUE: Multidetector CT imaging of the abdomen was performed following the standard protocol without IV contrast. COMPARISON:  08/07/2019 FINDINGS: Lower chest: Trace pleural fluid. Mild atelectasis posteriorly in the lung bases. Hepatobiliary: No focal liver abnormality is seen. No gallstones, gallbladder wall thickening, or biliary dilatation. Pancreas: Unremarkable. No pancreatic ductal dilatation or surrounding inflammatory changes. Spleen: Normal in size without focal abnormality. Adrenals/Urinary Tract: Adrenal glands unremarkable. Stable left renal cysts. No hydronephrosis or urolithiasis. Stomach/Bowel: Stomach is nondistended. There is a small window for percutaneous gastrostomy placement evident. Visualized small bowel and colon are nondilated. Feeding tube extends to the  pylorus. Vascular/Lymphatic: Subcentimeter left para-aortic lymph nodes as before. Minimal calcified plaque in the proximal right common iliac artery. Other: No abdominal ascites.  No free air. Musculoskeletal: No acute or significant osseous findings. IMPRESSION: 1. Small window present for percutaneous gastrostomy placement. Consider preprocedure barium for colonic opacification. Electronically Signed   By: Lucrezia Europe M.D.   On: 12/12/2020 08:20      Flora Lipps, MD  [facility] 12/12/2020  If 7PM-7AM, please contact night-coverage
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
## 2020-12-12 NOTE — NC FL2 (Addendum)
[place] LEVEL OF CARE SCREENING TOOL     IDENTIFICATION  Patient Name: Courtney Davies Birthdate: [birth year] Sex: female Admission Date (Current Location): 12/01/2020  [facility] and [place] Number:  Herbalist and Address:  [facility], [place] [address], [place], [place] [address]      Provider Number: [id number]  Attending Physician Name and Address:  Flora Lipps, MD  Relative Name and Phone Number:  [name] [phone]    Current Level of Care: Hospital Recommended Level of Care: [place] Prior Approval Number:    Date Approved/Denied:   PASRR Number: [id number]  Discharge Plan: SNF    Current Diagnoses: Patient Active Problem List   Diagnosis Date Noted   Acute systolic heart failure (HCC)    Encephalopathy acute    Type 1 diabetes mellitus without complication ([place]) 50/53/9767   Hypertension 12/02/2020   GERD (gastroesophageal reflux disease) 12/02/2020   Anemia 12/02/2020   Cardiac arrest ([place]) 12/01/2020    Orientation RESPIRATION BLADDER Height & Weight        Normal Incontinent, External catheter (External Urinary Catheter) Weight: 225 lb 1.4 oz (102.1 kg) Height:  5' 6"  (167.6 cm)  BEHAVIORAL SYMPTOMS/MOOD NEUROLOGICAL BOWEL NUTRITION STATUS      Incontinent Diet (Please see discharge summary)  AMBULATORY STATUS COMMUNICATION OF NEEDS Skin   Total Care Verbally Other (Comment) (Abrasion Chest Left,Wound/Incision open or dehiced, laceration head posterior,upper,approximately 4 inch laceration)                       Personal Care Assistance Level of Assistance  Bathing, Feeding, Dressing Bathing Assistance: Maximum assistance Feeding assistance: Maximum assistance (Peg Tube) Dressing Assistance: Maximum assistance     Functional Limitations Info  Sight, Hearing, Speech     Speech Info: Impaired ([place])    SPECIAL CARE FACTORS FREQUENCY  PT (By licensed PT), OT (By licensed  OT)     PT Frequency: 5x min weekly OT Frequency: 5x min weekly            Contractures Contractures Info: Not present    Additional Factors Info  Code Status, Allergies, Insulin Sliding Scale Code Status Info: FULL Allergies Info: Other/Hair glue,Codeine,Latex,Nitrofurantoin   Insulin Sliding Scale Info: insulin aspart (novoLOG) injection 0-20 Units every 4 hours       Current Medications (12/12/2020):  This is the current hospital active medication list Current Facility-Administered Medications  Medication Dose Route Frequency Provider Last Rate Last Admin   acetaminophen (TYLENOL) 160 MG/5ML solution 650 mg  650 mg Per Tube Q6H Agarwala, Einar Grad, MD   650 mg at 12/12/20 1610   albuterol (VENTOLIN HFA) 108 (90 Base) MCG/ACT inhaler 2 puff  2 puff Inhalation Q4H PRN Shearon Stalls, Rahul P, PA-C   2 puff at 12/07/20 2004   amiodarone (PACERONE) tablet 200 mg  200 mg Per Tube BID Sherren Mocha, MD   200 mg at 12/12/20 0859   carvedilol (COREG) tablet 6.25 mg  6.25 mg Per Tube BID WC Pokhrel, Laxman, MD   6.25 mg at 12/12/20 1610   [START ON 12/13/2020] ceFAZolin (ANCEF) IVPB 2g/100 mL premix  2 g Intravenous to Joslyn Devon, Olin Hauser, PA-C       feeding supplement (PROSource TF) liquid 45 mL  45 mL Per Tube BID Agarwala, Einar Grad, MD   45 mL at 12/12/20 0901   feeding supplement (VITAL 1.5 CAL) liquid 1,000 mL  1,000 mL Per Tube Continuous Kipp Brood, MD  50 mL/hr at 12/12/20 0015 1,000 mL at 12/12/20 0015   free water 200 mL  200 mL Per Tube Q4H Agarwala, Ravi, MD   200 mL at 12/12/20 1622   insulin aspart (novoLOG) injection 0-20 Units  0-20 Units Subcutaneous Q4H Agarwala, Einar Grad, MD   4 Units at 12/12/20 1623   labetalol (NORMODYNE) injection 10 mg  10 mg Intravenous Q10 min PRN Kipp Brood, MD   10 mg at 12/07/20 1824   losartan (COZAAR) tablet 25 mg  25 mg Per Tube Daily Sherren Mocha, MD   25 mg at 12/12/20 0911   ondansetron (ZOFRAN) injection 4 mg  4 mg Intravenous Q6H PRN Kipp Brood, MD   4 mg at 12/06/20 2010     Discharge Medications: Please see discharge summary for a list of discharge medications.  Relevant Imaging Results:  Relevant Lab Results:   Additional Information [id number], Covid + onset date 12/01/20 out of precautions   Trula Ore, LCSWA

## 2020-12-12 NOTE — Progress Notes (Addendum)
Progress Note  Patient Name: Courtney Davies Date of Encounter: 12/12/2020  [facility] Cardiologist: None   Subjective   Staff at bedside. Patient very combative last night and this morning. Remains essentially non-verbal.   Inpatient Medications    Scheduled Meds:  acetaminophen (TYLENOL) oral liquid 160 mg/5 mL  650 mg Per Tube Q6H   amiodarone  200 mg Per Tube BID   carvedilol  6.25 mg Per Tube BID WC   enoxaparin (LOVENOX) injection  100 mg Subcutaneous Q12H   feeding supplement (PROSource TF)  45 mL Per Tube BID   free water  200 mL Per Tube Q4H   insulin aspart  0-20 Units Subcutaneous Q4H   losartan  25 mg Per Tube Daily   Continuous Infusions:  feeding supplement (VITAL 1.5 CAL) 1,000 mL (12/12/20 0015)   PRN Meds: albuterol, labetalol, ondansetron (ZOFRAN) IV   Vital Signs    Vitals:   12/11/20 1720 12/11/20 2147 12/12/20 0500 12/12/20 0639  BP: 126/72 117/78    Pulse: (!) 109 95    Resp:  18  18  Temp:  97.8 F (36.6 C)  98.4 F (36.9 C)  TempSrc:  Axillary  Axillary  SpO2:  98%    Weight:   102.1 kg   Height:        Intake/Output Summary (Last 24 hours) at 12/12/2020 0850 Last data filed at 12/11/2020 1800 Gross per 24 hour  Intake --  Output 200 ml  Net -200 ml   Last 3 Weights 12/12/2020 12/11/2020 12/09/2020  Weight (lbs) 225 lb 1.4 oz 233 lb 11 oz 231 lb 11.3 oz  Weight (kg) 102.1 kg 106 kg 105.1 kg      Telemetry    SR - Personally Reviewed  ECG    No new tracing  Physical Exam  Awake, tracks with eyes. Non-verbal GEN: No acute distress.   Neck: No JVD Cardiac: RRR, no murmurs, rubs, or gallops.  Respiratory: Clear to auscultation bilaterally. GI: Soft, nontender, non-distended  MS: No edema; No deformity. Neuro:  Nonfocal  Psych: Unknown  Labs    High Sensitivity Troponin:   Recent Labs  Lab 12/01/20 0913 12/01/20 1706 12/02/20 0834  TROPONINIHS 65* 5,186* 3,074*      Chemistry Recent Labs  Lab 12/10/20 0316  12/11/20 0551 12/12/20 0743  NA 134* 133* 134*  K 4.3 4.6 4.6  CL 102 104 105  CO2 22 21* 22  GLUCOSE 166* 196* 214*  BUN 18 18 20   CREATININE 0.78 0.87 0.89  CALCIUM 9.1 9.1 9.1  GFRNONAA >60 >60 >60  ANIONGAP 10 8 7      Hematology Recent Labs  Lab 12/07/20 0510 12/08/20 0250 12/10/20 0316  WBC 13.5* 14.5* 14.4*  RBC 4.71 5.11 5.61*  HGB 10.4* 11.6* 12.6  HCT 34.0* 36.3 39.5  MCV 72.2* 71.0* 70.4*  MCH 22.1* 22.7* 22.5*  MCHC 30.6 32.0 31.9  RDW 16.1* 15.7* 15.9*  PLT 262 280 327    BNPNo results for input(s): BNP, PROBNP in the last 168 hours.   DDimer No results for input(s): DDIMER in the last 168 hours.   Radiology    CT ABDOMEN WO CONTRAST  Result Date: 12/12/2020 CLINICAL DATA:  Dysphasia, preop planning for gastrostomy tube EXAM: CT ABDOMEN WITHOUT CONTRAST TECHNIQUE: Multidetector CT imaging of the abdomen was performed following the standard protocol without IV contrast. COMPARISON:  08/07/2019 FINDINGS: Lower chest: Trace pleural fluid. Mild atelectasis posteriorly in the lung bases. Hepatobiliary: No focal  liver abnormality is seen. No gallstones, gallbladder wall thickening, or biliary dilatation. Pancreas: Unremarkable. No pancreatic ductal dilatation or surrounding inflammatory changes. Spleen: Normal in size without focal abnormality. Adrenals/Urinary Tract: Adrenal glands unremarkable. Stable left renal cysts. No hydronephrosis or urolithiasis. Stomach/Bowel: Stomach is nondistended. There is a small window for percutaneous gastrostomy placement evident. Visualized small bowel and colon are nondilated. Feeding tube extends to the pylorus. Vascular/Lymphatic: Subcentimeter left para-aortic lymph nodes as before. Minimal calcified plaque in the proximal right common iliac artery. Other: No abdominal ascites.  No free air. Musculoskeletal: No acute or significant osseous findings. IMPRESSION: 1. Small window present for percutaneous gastrostomy placement.  Consider preprocedure barium for colonic opacification. Electronically Signed   By: Lucrezia Europe M.D.   On: 12/12/2020 08:20    Cardiac Studies   Echo: 12/01/20  IMPRESSIONS     1. Left ventricular ejection fraction, by estimation, is 40 to 45%. The  left ventricle has mildly decreased function. The left ventricle  demonstrates global hypokinesis. There is mild left ventricular  hypertrophy. Left ventricular diastolic parameters  are consistent with Grade I diastolic dysfunction (impaired relaxation).   2. Right ventricular systolic function is normal. The right ventricular  size is normal. There is normal pulmonary artery systolic pressure.   3. The mitral valve is normal in structure. Mild to moderate mitral valve  regurgitation. No evidence of mitral stenosis.   4. The aortic valve was not well visualized. Aortic valve regurgitation  is not visualized. Mild aortic valve sclerosis is present, with no  evidence of aortic valve stenosis.   Patient Profile     45 y.o. female with ventricular fibrillation out of hospital cardiac arrest and anoxic encephalopathy.  Assessment & Plan    Cardiac Arrest: 2/2 vfib with prolonged down time and multiple defibrillations. Has had some intermittent ectopy on telemetry but now resolved.  -- remains on carvedilol and amiodarone -- cardiology to follow peripherally regarding neurological recovery, at this point not currently a candidate for invasive cardiac testing. Appears she still has a long recovery ahead-> cardiac arrest is most likely a respiratory event given the time of presentation.  Anoxic encephalopathy: in the setting of prolonged down time. Remains essentially non-verbal.  -- planned for PEG tube today  HTN: stable with current therapy -- coreg and losartan  Possible paroxysmal afib: notes mention episodes of transient afib early on in admission during acute illness. Review of telemetry back to 8/25 without recurrent episodes. Given  reported brief duration of episodes would not favor on-going long term anticoagulation.  Agree that without any documented evidence of recurrent A. fib and the transient A. fib considered SVT based on cardiology notes, I would not place a long-term anticoagulation.  For questions or updates, please contact [place] Please consult [email or web address] for contact info under      Signed, Reino Bellis, NP  12/12/2020, 8:50 AM     ATTENDING ATTESTATION  I have seen, examined and evaluated the patient this AM along with Reino Bellis, NP-C.  After reviewing all the available data and chart, we discussed the patients laboratory, study & physical findings as well as symptoms in detail. I agree with her findings, examination as well as impression recommendations as per our discussion.    Attending adjustments noted in italics.   Patient continues to be encephalopathic.  Has been combative overnight.  Clearly evidence of documented nonsustained VT, however I suspect that her cardiac arrest is mostly pulmonary in nature.  Not a candidate for  invasive evaluation at this time.  I agree that with no documented evidence of A. fib, would not place him automatic regulation.  We have stopped treatment dose Lovenox.  Would switch to prophylactic Lovenox for DVT prophylaxis.  [facility] will sign off.  But will be available for assistance if necessary. Medication Recommendations: We will DC treatment dosed Lovenox and placed on DVT prophylaxis.  Other recommendations (labs, testing, etc):   No plans for invasive cardiac evaluation at this time. Follow up as an outpatient: Pending neurologic recovery.    Glenetta Hew, M.D., M.S. Interventional Cardiologist   Pager # [phone] Phone # [phone] [address] [place], [place] [address]

## 2020-12-12 NOTE — Consult Note (Signed)
Chief Complaint: Patient was seen in consultation today for percutaneous gastric tube placement Chief Complaint  Patient presents with   Cardiac Arrest   at the request of Dr Sherlon Handing   Supervising Physician: Markus Daft  Patient Status: [facility] - In-pt  History of Present Illness: Courtney Davies is a 45 y.o. female   Hx HTN; OSA Cardiac arrest 12/01/20-  VFib with prolonged downtime- multiple defibrillations. Anoxic encephalopathy Aspiration PNA Deconditioning Dysphagia Speech rec: NPO  Request made for percutaneous gastric tube placement  Dr Anselm Pancoast has reviewed imaging and approves procedure Will have Omnipaque po night before procedure to fully opacify bowel.    Past Medical History:  Diagnosis Date   Anemia    Diabetes type 2, controlled ([place])    GERD (gastroesophageal reflux disease)    Hypertension     History reviewed. No pertinent surgical history.  Allergies: Other, Codeine, Latex, and Nitrofurantoin  Medications: Prior to Admission medications   Medication Sig Start Date End Date Taking? Authorizing Provider  folic acid (FOLVITE) 1 MG tablet Take 1 mg by mouth daily.    [provider]  HYDROcodone-acetaminophen (NORCO) 7.5-325 MG tablet Take 1 tablet by mouth 3 (three) times daily as needed for pain. 11/07/20   [provider]  LINZESS 290 MCG CAPS capsule Take 290 mcg by mouth daily. 12/01/20   [provider]  meloxicam (MOBIC) 15 MG tablet Take 15 mg by mouth daily. 06/07/20   [provider]  nirmatrelvir & ritonavir (PAXLOVID) 20 x 150 MG & 10 x 100MG TBPK Take 100-150 mg by mouth See admin instructions. follow package directions 11/10/20   [provider]  pantoprazole (PROTONIX) 40 MG tablet Take 40 mg by mouth 2 (two) times daily. 07/07/20   [provider]  phentermine (ADIPEX-P) 37.5 MG tablet Take 37.5 mg by mouth daily. 07/12/20   [provider]  PROAIR HFA 108 (90 Base) MCG/ACT  inhaler Inhale 1-2 puffs into the lungs every 4 (four) hours as needed. 07/25/20   [provider]  tiZANidine (ZANAFLEX) 4 MG tablet Take 4 mg by mouth at bedtime. 09/27/20   [provider]  TRULICITY 4.5 ZO/1.0RU SOPN Inject 4.5 mg into the skin once a week. 10/26/20   [provider]  Vitamin D, Ergocalciferol, (DRISDOL) 1.25 MG (50000 UNIT) CAPS capsule Take 50,000 Units by mouth once a week. 10/26/20   [provider]     History reviewed. No pertinent family history.  Social History   Socioeconomic History   Marital status: Single    Spouse name: Not on file   Number of children: Not on file   Years of education: Not on file   Highest education level: Not on file  Occupational History   Not on file  Tobacco Use   Smoking status: Unknown   Smokeless tobacco: Not on file  Vaping Use   Vaping Use: Unknown  Substance and Sexual Activity   Alcohol use: Not Currently   Drug use: Never   Sexual activity: Not on file  Other Topics Concern   Not on file  Social History Narrative   Not on file   Social Determinants of Health   Financial Resource Strain: Not on file  Food Insecurity: Not on file  Transportation Needs: Not on file  Physical Activity: Not on file  Stress: Not on file  Social Connections: Not on file     Review of Systems: A 12 point ROS discussed and pertinent positives  are indicated in the HPI above.  All other systems are negative.    Vital Signs: BP (!) 139/109   Pulse 95   Temp 98.4 F (36.9 C) (Axillary)   Resp 18   Ht 5' 6"  (1.676 m)   Wt 225 lb 1.4 oz (102.1 kg)   SpO2 98%   BMI 36.33 kg/m   Physical Exam Vitals reviewed.  HENT:     Mouth/Throat:     Mouth: Mucous membranes are moist.  Cardiovascular:     Rate and Rhythm: Normal rate and regular rhythm.  Abdominal:     Palpations: Abdomen is soft.     Tenderness: There is no abdominal tenderness.  Musculoskeletal:        General: Normal range of  motion.     Comments: Does not follow commands Moves all 4s  Skin:    General: Skin is warm.  Neurological:     Mental Status: She is alert.     Comments: Nopn verbal Seems to follow me in room Can nod yes to me---- not sure appropriate with nods   Psychiatric:     Comments: Spoke to sister [name] via phone for consent    Imaging: CT ABDOMEN WO CONTRAST  Result Date: 12/12/2020 CLINICAL DATA:  Dysphasia, preop planning for gastrostomy tube EXAM: CT ABDOMEN WITHOUT CONTRAST TECHNIQUE: Multidetector CT imaging of the abdomen was performed following the standard protocol without IV contrast. COMPARISON:  08/07/2019 FINDINGS: Lower chest: Trace pleural fluid. Mild atelectasis posteriorly in the lung bases. Hepatobiliary: No focal liver abnormality is seen. No gallstones, gallbladder wall thickening, or biliary dilatation. Pancreas: Unremarkable. No pancreatic ductal dilatation or surrounding inflammatory changes. Spleen: Normal in size without focal abnormality. Adrenals/Urinary Tract: Adrenal glands unremarkable. Stable left renal cysts. No hydronephrosis or urolithiasis. Stomach/Bowel: Stomach is nondistended. There is a small window for percutaneous gastrostomy placement evident. Visualized small bowel and colon are nondilated. Feeding tube extends to the pylorus. Vascular/Lymphatic: Subcentimeter left para-aortic lymph nodes as before. Minimal calcified plaque in the proximal right common iliac artery. Other: No abdominal ascites.  No free air. Musculoskeletal: No acute or significant osseous findings. IMPRESSION: 1. Small window present for percutaneous gastrostomy placement. Consider preprocedure barium for colonic opacification. Electronically Signed   By: Lucrezia Europe M.D.   On: 12/12/2020 08:20   DG Chest 1 View  Result Date: 12/04/2020 CLINICAL DATA:  Cardiac arrest.  Possible pneumonia. EXAM: CHEST  1 VIEW COMPARISON:  12/02/2020 FINDINGS: Endotracheal tube is in place, tip approximately  2.8 centimeters above the carina. Feeding tube is in place, tip beyond the stomach. LEFT IJ central line tip overlies the LOWER superior vena cava/RIGHT atrium. Heart size is accentuated by technique, probably UPPER limits normal. There is increased opacity at the RIGHT lung base, consistent with developing infiltrate. No evidence for edema. IMPRESSION: Increasing RIGHT LOWER lobe infiltrate. Electronically Signed   By: Nolon Nations M.D.   On: 12/04/2020 12:15   DG Abd 1 View  Result Date: 12/01/2020 CLINICAL DATA:  Nasogastric tube placement. EXAM: ABDOMEN - 1 VIEW COMPARISON:  None. FINDINGS: Tip and side port of the enteric tube below the diaphragm in the stomach. There is a general paucity of bowel gas in the included abdomen. IMPRESSION: Tip and side port of the enteric tube below the diaphragm in the stomach. Electronically Signed   By: Keith Rake M.D.   On: 12/01/2020 19:40   CT HEAD WO CONTRAST (5MM)  Result Date: 12/03/2020 CLINICAL DATA:  Mental status change  EXAM: CT HEAD WITHOUT CONTRAST TECHNIQUE: Contiguous axial images were obtained from the base of the skull through the vertex without intravenous contrast. COMPARISON:  12/01/2020 FINDINGS: Brain: No acute infarct or hemorrhage. Lateral ventricles and midline structures are stable. No acute extra-axial fluid collections. No mass effect. Vascular: No hyperdense vessel or unexpected calcification. Skull: Normal. Negative for fracture or focal lesion. Sinuses/Orbits: Mild mucosal thickening within the ethmoid and sphenoid sinuses likely due to intubation and enteric catheter placement. Remaining sinuses are clear. Other: None. IMPRESSION: 1. No acute intracranial process. Electronically Signed   By: Randa Ngo M.D.   On: 12/03/2020 20:20   CT HEAD WO CONTRAST (5MM)  Result Date: 12/01/2020 CLINICAL DATA:  Fall. EXAM: CT HEAD WITHOUT CONTRAST CT CERVICAL SPINE WITHOUT CONTRAST TECHNIQUE: Multidetector CT imaging of the head and  cervical spine was performed following the standard protocol without intravenous contrast. Multiplanar CT image reconstructions of the cervical spine were also generated. COMPARISON:  None. FINDINGS: CT HEAD FINDINGS Brain: No evidence of acute infarction, hemorrhage, hydrocephalus, extra-axial collection or mass lesion/mass effect. Vascular: No hyperdense vessel or unexpected calcification. Skull: Normal. Negative for fracture or focal lesion. Sinuses/Orbits: Secretions in the nasal cavity and nasopharynx. Clear sinuses and mastoid air cells. The orbits are unremarkable. Other: None. CT CERVICAL SPINE FINDINGS Alignment: Straightening and slight reversal of the normal cervical lordosis. No listhesis. Skull base and vertebrae: No acute fracture. No primary bone lesion or focal pathologic process. Soft tissues and spinal canal: No prevertebral fluid or swelling. No visible canal hematoma. Disc levels: Mild multilevel anterior endplate spurring throughout the cervical spine with relatively preserved disc heights. Upper chest: Please see separate CT chest report from same day. Other: Endotracheal and enteric tubes noted extending below the field of view. IMPRESSION: 1. No acute intracranial abnormality. 2. No acute cervical spine fracture or traumatic listhesis. Electronically Signed   By: Titus Dubin M.D.   On: 12/01/2020 11:28   CT Angio Chest PE W/Cm &/Or Wo Cm  Result Date: 12/01/2020 CLINICAL DATA:  PE suspected.  High probability.  VFib arrest. EXAM: CT ANGIOGRAPHY CHEST WITH CONTRAST TECHNIQUE: Multidetector CT imaging of the chest was performed using the standard protocol during bolus administration of intravenous contrast. Multiplanar CT image reconstructions and MIPs were obtained to evaluate the vascular anatomy. CONTRAST:  75m OMNIPAQUE IOHEXOL 350 MG/ML SOLN COMPARISON:  None. FINDINGS: Cardiovascular: Satisfactory opacification of the pulmonary arteries to the segmental level. No evidence of  pulmonary embolism. Normal heart size. No pericardial effusion. Mediastinum/Nodes: The ET tube tip terminates in the proximal right mainstem bronchus. Recommend withdrawing by at least 2 cm. NG tube tip is well below the level of the GE junction and appears looped in the proximal stomach. Enlarged and heterogeneous thyroid gland noted. No axillary, supraclavicular, mediastinal, or hilar adenopathy. Lungs/Pleura: There is complete airspace consolidation of the left lower lobe and partial airspace consolidation involving the posterior and medial right lower lobe. Findings are likely secondary to aspiration although pneumonia is not excluded. Scattered linear areas of subsegmental atelectasis noted bilaterally. Upper Abdomen: No acute abnormality. Musculoskeletal: No chest wall abnormality. No acute or significant osseous findings. Review of the MIP images confirms the above findings. IMPRESSION: 1. No evidence for acute pulmonary embolus. 2. ET tube tip terminates in the proximal right mainstem bronchus. Recommend withdrawing by at least 2 cm. 3. Bilateral lower lobe airspace consolidation, left greater than right. Findings are likely secondary to aspiration although pneumonia is not excluded. 4. Enlarged and heterogeneous thyroid  gland. Recommend thyroid ultrasound (ref: J Am Coll Radiol. 2015 Feb;12(2): 143-50). Electronically Signed   By: Kerby Moors M.D.   On: 12/01/2020 11:34   CT Cervical Spine Wo Contrast  Result Date: 12/01/2020 CLINICAL DATA:  Fall. EXAM: CT HEAD WITHOUT CONTRAST CT CERVICAL SPINE WITHOUT CONTRAST TECHNIQUE: Multidetector CT imaging of the head and cervical spine was performed following the standard protocol without intravenous contrast. Multiplanar CT image reconstructions of the cervical spine were also generated. COMPARISON:  None. FINDINGS: CT HEAD FINDINGS Brain: No evidence of acute infarction, hemorrhage, hydrocephalus, extra-axial collection or mass lesion/mass effect.  Vascular: No hyperdense vessel or unexpected calcification. Skull: Normal. Negative for fracture or focal lesion. Sinuses/Orbits: Secretions in the nasal cavity and nasopharynx. Clear sinuses and mastoid air cells. The orbits are unremarkable. Other: None. CT CERVICAL SPINE FINDINGS Alignment: Straightening and slight reversal of the normal cervical lordosis. No listhesis. Skull base and vertebrae: No acute fracture. No primary bone lesion or focal pathologic process. Soft tissues and spinal canal: No prevertebral fluid or swelling. No visible canal hematoma. Disc levels: Mild multilevel anterior endplate spurring throughout the cervical spine with relatively preserved disc heights. Upper chest: Please see separate CT chest report from same day. Other: Endotracheal and enteric tubes noted extending below the field of view. IMPRESSION: 1. No acute intracranial abnormality. 2. No acute cervical spine fracture or traumatic listhesis. Electronically Signed   By: Titus Dubin M.D.   On: 12/01/2020 11:28   MR BRAIN WO CONTRAST  Result Date: 12/04/2020 CLINICAL DATA:  Anoxic brain damage EXAM: MRI HEAD WITHOUT CONTRAST TECHNIQUE: Multiplanar, multiecho pulse sequences of the brain and surrounding structures were obtained without intravenous contrast. COMPARISON:  None. FINDINGS: Brain: Areas of cortical reduced diffusion bilaterally. Additional involvement of the hippocampi. No evidence of intracranial hemorrhage. No significant mass effect. No herniation. No hydrocephalus. Vascular: Major vessel flow voids at the skull base are preserved. Skull and upper cervical spine: Normal marrow signal is preserved. Sinuses/Orbits: Paranasal sinuses are aerated. Orbits are unremarkable. Other: Sella is unremarkable.  Mastoid air cells are clear. IMPRESSION: Areas of bilateral cerebral cortical reduced diffusion. Reduced diffusion along the hippocampi. This is consistent with hypoxic/ischemic injury. Electronically Signed   By:  Macy Mis M.D.   On: 12/04/2020 13:43   MR CERVICAL SPINE WO CONTRAST  Result Date: 12/04/2020 CLINICAL DATA:  Anoxic brain damage, cervical trauma EXAM: MRI CERVICAL SPINE WITHOUT CONTRAST TECHNIQUE: Multiplanar, multisequence MR imaging of the cervical spine was performed. No intravenous contrast was administered. COMPARISON:  Correlation made with CT 12/01/2020 FINDINGS: Alignment: No significant listhesis. Vertebrae: Vertebral body heights are maintained. Mild marrow edema at the C6-C7 endplates probably degenerative. Cord: Normal caliber and signal. Posterior Fossa, vertebral arteries, paraspinal tissues: Unremarkable. Disc levels: C2-C3:  No canal or foraminal stenosis. C3-C4:  Small central protrusion.  No canal or foraminal stenosis. C4-C5: Minimal disc bulge with endplate osteophytes and small central disc protrusion. Mild right facet hypertrophy. No canal or foraminal stenosis. C5-C6: Minimal disc bulge with endplate osteophytes. Mild uncovertebral hypertrophy. No canal or foraminal stenosis. C6-C7: A minimal disc bulge with left central protrusion endplate osteophytes. Mild uncovertebral hypertrophy. Minor canal stenosis. No foraminal stenosis. C7-T1:  No canal or foraminal stenosis. IMPRESSION: No evidence of soft tissue or ligamentous injury. Mild degenerative changes. Electronically Signed   By: Macy Mis M.D.   On: 12/04/2020 13:50   DG CHEST PORT 1 VIEW  Result Date: 12/05/2020 CLINICAL DATA:  Cardiac arrest.  ET tube EXAM: PORTABLE  CHEST 1 VIEW COMPARISON:  12/05/2019 FINDINGS: Endotracheal tube, left central line, feeding tube remain in place, unchanged. Mild cardiomegaly, vascular congestion. Old improving infiltrate at the right lung base. No confluent airspace opacities or effusions or overt edema. IMPRESSION: Support devices stable. Improving right basilar infiltrate. Borderline cardiomegaly, vascular congestion. Electronically Signed   By: Rolm Baptise M.D.   On: 12/05/2020  16:30   DG CHEST PORT 1 VIEW  Result Date: 12/02/2020 CLINICAL DATA:  Intubation.  COVID positive. EXAM: PORTABLE CHEST 1 VIEW COMPARISON:  12/02/2020 FINDINGS: Endotracheal tube is present with tip measuring 4.3 cm above the carina. An enteric tube is present. Tip is off the field of view but below the left hemidiaphragm. Left central venous catheter with tip over the cavoatrial junction region. Shallow inspiration. Heart size is normal. Increased density in the lung bases could be due to vascular crowding or atelectasis. No pleural effusions. No pneumothorax. IMPRESSION: Appliances appear in satisfactory position. Shallow inspiration with vascular crowding versus atelectasis in the bases. Electronically Signed   By: Lucienne Capers M.D.   On: 12/02/2020 19:42   DG Chest Port 1 View  Result Date: 12/02/2020 CLINICAL DATA:  History of cardiac arrest EXAM: PORTABLE CHEST 1 VIEW COMPARISON:  Chest x-ray dated August 18th 2022 FINDINGS: Stable position of ET tube and enteric tube. Cardiac and mediastinal contours are unchanged. Bibasilar opacities, likely a combination of atelectasis and pleural fluid. No evidence of pneumothorax. IMPRESSION: Increased right basilar opacity is, likely a combination of atelectasis and pleural fluid. Electronically Signed   By: Yetta Glassman M.D.   On: 12/02/2020 08:54   DG CHEST PORT 1 VIEW  Result Date: 12/01/2020 CLINICAL DATA:  Endotracheal tube. EXAM: PORTABLE CHEST 1 VIEW COMPARISON:  Same day. FINDINGS: Stable cardiomediastinal silhouette. Endotracheal and nasogastric tubes are unchanged in position. Lungs are clear. No pneumothorax or pleural effusion is noted. Bony thorax is unremarkable. IMPRESSION: Stable support apparatus.  No definite acute abnormality is noted. Electronically Signed   By: Marijo Conception M.D.   On: 12/01/2020 13:15   DG Chest Portable 1 View  Result Date: 12/01/2020 CLINICAL DATA:  Retraction of ET tube EXAM: PORTABLE CHEST 1 VIEW  COMPARISON:  Chest radiograph obtained earlier the same day FINDINGS: The endotracheal tube has been retracted; the tip now terminates approximately 2.7 cm from the carina. The enteric catheter tip is off the field of view. A defibrillator pad again overlies the chest. The cardiomediastinal silhouette is stable. There are hazy opacities in the lingula, unchanged. There is no new focal airspace opacity. There is no pleural effusion. There is no appreciable pneumothorax. The bones are stable. IMPRESSION: Interval retraction of the endotracheal tube with the tip now in satisfactory position. Electronically Signed   By: Valetta Mole M.D.   On: 12/01/2020 09:51   DG Chest Portable 1 View  Result Date: 12/01/2020 CLINICAL DATA:  Intubation EXAM: PORTABLE CHEST 1 VIEW COMPARISON:  04/25/2016 FINDINGS: ETT terminates within the right mainstem bronchus. Enteric tube courses below the diaphragm with distal tip beyond the inferior margin of the film. Heart size within normal limits. Patchy left basilar opacity. Mild volume loss within the left hemithorax. Right lung is clear. No pleural effusion or pneumothorax. IMPRESSION: 1. ETT terminates within the right mainstem bronchus. A subsequent image in PACs was obtained following ET tube retraction. 2. Patchy left basilar opacity likely atelectasis. Electronically Signed   By: Davina Poke D.O.   On: 12/01/2020 09:50   DG Abd Portable  1V  Result Date: 12/07/2020 CLINICAL DATA:  Feeding tube placement EXAM: PORTABLE ABDOMEN - 1 VIEW COMPARISON:  KUB 12/02/2020 FINDINGS: Enteric catheter is in place with the tip projecting over the expected region of the pylorus/first portion of the duodenum. There is a relative paucity of bowel gas in the imaged abdomen without evidence of mechanical obstruction. The bones are stable. IMPRESSION: Enteric catheter tip projects over the expected location of the pylorus/first portion of the duodenum. Electronically Signed   By: Valetta Mole M.D.   On: 12/07/2020 11:11   DG Abd Portable 1V  Result Date: 12/02/2020 CLINICAL DATA:  NG tube placement EXAM: PORTABLE ABDOMEN - 1 VIEW COMPARISON:  KUB, 12/01/2020.  CT pelvis, 08/07/2019. FINDINGS: Enteric feeding tube, with tip to the LEFT of midline and projecting over the distal stomach. Nonobstructive bowel gas pattern. No osseous abnormality. IMPRESSION: Gastric placement of enteric feeding tube. Consider advancement if transpyloric positioning is desired. Electronically Signed   By: Michaelle Birks M.D.   On: 12/02/2020 16:28   EEG adult  Result Date: 12/02/2020 Lora Havens, MD     12/02/2020  1:48 PM Patient Name: Courtney Davies MRN: [id number] Epilepsy Attending: Lora Havens Referring Physician/Provider: Dr Ina Homes Date: 12/02/2020 Duration: 21.26 mins Patient history: 45yo F with ams. EEG to evaluate for seizure. Level of alertness:  comatose AEDs during EEG study: Propofol Technical aspects: This EEG study was done with scalp electrodes positioned according to the 10-20 International system of electrode placement. Electrical activity was acquired at a sampling rate of 500Hz  and reviewed with a high frequency filter of 70Hz  and a low frequency filter of 1Hz . EEG data were recorded continuously and digitally stored. Description: EEG showed continuous generalized polymorphic sharply contoured 5 to 6 Hz theta slowing. Hyperventilation and photic stimulation were not performed.   ABNORMALITY - Continuous slow, generalized IMPRESSION: This study is suggestive of moderate diffuse encephalopathy, nonspecific etiology but could be related to sedation. No seizures or epileptiform discharges were seen throughout the recording. Lora Havens   ECHOCARDIOGRAM COMPLETE  Result Date: 12/01/2020    ECHOCARDIOGRAM REPORT   Patient Name:   Courtney Davies Date of Exam: 12/01/2020 Medical Rec #:  [id number]       Height:       66.0 in Accession #:    [id number]      Weight:       220.5 lb  Date of Birth:  [birth year]       BSA:          2.084 m Patient Age:    38 years        BP:           107/77 mmHg Patient Gender: F               HR:           100 bpm. Exam Location:  Inpatient Procedure: 2D Echo, Cardiac Doppler and Color Doppler Indications:    Cardiac arrest  History:        Patient has no prior history of Echocardiogram examinations.  Sonographer:    Luisa Hart RDCS Referring Phys: [id number] GRACE E BOWSER  Sonographer Comments: Echo performed with patient supine and on artificial respirator. Image acquisition challenging due to patient body habitus. IMPRESSIONS  1. Left ventricular ejection fraction, by estimation, is 40 to 45%. The left ventricle has mildly decreased function. The left ventricle demonstrates global hypokinesis. There is mild left ventricular hypertrophy. Left  ventricular diastolic parameters are consistent with Grade I diastolic dysfunction (impaired relaxation).  2. Right ventricular systolic function is normal. The right ventricular size is normal. There is normal pulmonary artery systolic pressure.  3. The mitral valve is normal in structure. Mild to moderate mitral valve regurgitation. No evidence of mitral stenosis.  4. The aortic valve was not well visualized. Aortic valve regurgitation is not visualized. Mild aortic valve sclerosis is present, with no evidence of aortic valve stenosis. FINDINGS  Left Ventricle: Left ventricular ejection fraction, by estimation, is 40 to 45%. The left ventricle has mildly decreased function. The left ventricle demonstrates global hypokinesis. The left ventricular internal cavity size was normal in size. There is  mild left ventricular hypertrophy. Left ventricular diastolic parameters are consistent with Grade I diastolic dysfunction (impaired relaxation). Right Ventricle: The right ventricular size is normal. No increase in right ventricular wall thickness. Right ventricular systolic function is normal. There is normal pulmonary artery  systolic pressure. The tricuspid regurgitant velocity is 2.16 m/s, and  with an assumed right atrial pressure of 3 mmHg, the estimated right ventricular systolic pressure is 44.3 mmHg. Left Atrium: Left atrial size was normal in size. Right Atrium: Right atrial size was normal in size. Pericardium: There is no evidence of pericardial effusion. Mitral Valve: The mitral valve is normal in structure. Mild to moderate mitral valve regurgitation. No evidence of mitral valve stenosis. MV peak gradient, 3.8 mmHg. The mean mitral valve gradient is 2.0 mmHg. Tricuspid Valve: The tricuspid valve is normal in structure. Tricuspid valve regurgitation is trivial. Aortic Valve: The aortic valve was not well visualized. Aortic valve regurgitation is not visualized. Mild aortic valve sclerosis is present, with no evidence of aortic valve stenosis. Aortic valve mean gradient measures 3.0 mmHg. Aortic valve peak gradient measures 6.1 mmHg. Aortic valve area, by VTI measures 2.34 cm. Pulmonic Valve: The pulmonic valve was not well visualized. Pulmonic valve regurgitation is not visualized. Aorta: The aortic root is normal in size and structure. IAS/Shunts: The interatrial septum was not well visualized.  LEFT VENTRICLE PLAX 2D LVIDd:         4.40 cm     Diastology LVIDs:         4.80 cm     LV e' medial:    6.74 cm/s LV PW:         1.50 cm     LV E/e' medial:  10.8 LV IVS:        1.10 cm     LV e' lateral:   6.64 cm/s LVOT diam:     2.00 cm     LV E/e' lateral: 11.0 LV SV:         39 LV SV Index:   19 LVOT Area:     3.14 cm  LV Volumes (MOD) LV vol d, MOD A2C: 74.3 ml LV vol d, MOD A4C: 67.3 ml LV vol s, MOD A2C: 36.8 ml LV vol s, MOD A4C: 49.0 ml LV SV MOD A2C:     37.5 ml LV SV MOD A4C:     67.3 ml LV SV MOD BP:      29.1 ml RIGHT VENTRICLE RV Basal diam:  3.00 cm RV Mid diam:    1.70 cm RV S prime:     15.50 cm/s TAPSE (M-mode): 1.9 cm LEFT ATRIUM           Index       RIGHT ATRIUM  Index LA diam:      3.20 cm 1.54 cm/m   RA Area:     11.80 cm LA Vol (A2C): 35.4 ml 16.98 ml/m RA Volume:   26.90 ml  12.91 ml/m LA Vol (A4C): 25.7 ml 12.33 ml/m  AORTIC VALVE                   PULMONIC VALVE AV Area (Vmax):    2.07 cm    PV Vmax:       0.94 m/s AV Area (Vmean):   1.91 cm    PV Vmean:      67.800 cm/s AV Area (VTI):     2.34 cm    PV VTI:        0.183 m AV Vmax:           123.00 cm/s PV Peak grad:  3.6 mmHg AV Vmean:          84.200 cm/s PV Mean grad:  2.0 mmHg AV VTI:            0.168 m AV Peak Grad:      6.1 mmHg AV Mean Grad:      3.0 mmHg LVOT Vmax:         80.90 cm/s LVOT Vmean:        51.100 cm/s LVOT VTI:          0.125 m LVOT/AV VTI ratio: 0.74  AORTA Ao Root diam: 2.90 cm MITRAL VALVE                 TRICUSPID VALVE MV Area (PHT): 2.09 cm      TR Peak grad:   18.7 mmHg MV Area VTI:   1.68 cm      TR Vmax:        216.00 cm/s MV Peak grad:  3.8 mmHg MV Mean grad:  2.0 mmHg      SHUNTS MV Vmax:       0.98 m/s      Systemic VTI:  0.12 m MV Vmean:      71.5 cm/s     Systemic Diam: 2.00 cm MV Decel Time: 363 msec MR Peak grad:    88.7 mmHg MR Mean grad:    49.0 mmHg MR Vmax:         471.00 cm/s MR Vmean:        320.0 cm/s MR PISA:         0.57 cm MR PISA Eff ROA: 3 mm MR PISA Radius:  0.30 cm MV E velocity: 73.00 cm/s MV A velocity: 88.30 cm/s MV E/A ratio:  0.83 Oswaldo Milian MD Electronically signed by Oswaldo Milian MD Signature Date/Time: 12/01/2020/5:16:45 PM    Final    US THYROID  Result Date: 12/07/2020 CLINICAL DATA:  Goiter. EXAM: THYROID ULTRASOUND TECHNIQUE: Ultrasound examination of the thyroid gland and adjacent soft tissues was performed. COMPARISON:  06/02/2004 FINDINGS: Parenchymal Echotexture: Mildly heterogeneous Isthmus: 1.4 cm Right lobe: 5.9 x 3.3 x 2.9 cm Left lobe: 5.7 x 2.9 x 3.1 cm _________________________________________________________ Estimated total number of nodules >/= 1 cm: 1 Number of spongiform nodules >/=  2 cm not described below (TR1): 0 Number of mixed cystic and solid  nodules >/= 1.5 cm not described below ([place]): 0 _________________________________________________________ Nodule # 1: Location: Right; Inferior Maximum size: 2.3 cm; Other 2 dimensions: 1.9 x 1.9 cm, previously measured 1.2 x 1.0 x 1.1 cm in 2006. Composition: solid/almost completely solid (2) Echogenicity: hypoechoic (2) Shape: not  taller-than-wide (0) Margins: ill-defined (0) Echogenic foci: none (0) ACR TI-RADS total points: 4. ACR TI-RADS risk category: TR4 (4-6 points). ACR TI-RADS recommendations: **Given size (>/= 1.5 cm) and appearance, fine needle aspiration of this moderately suspicious nodule should be considered based on TI-RADS criteria. _________________________________________________________ IMPRESSION: Nodule 1 (TI-RADS 4) located in the inferior right thyroid lobe, meets criteria for FNA. This nodule demonstrate interval threshold growth since 2006. The above is in keeping with the ACR TI-RADS recommendations - J Am Coll Radiol 2017;14:587-595. Electronically Signed   By: Miachel Roux M.D.   On: 12/07/2020 10:55    Labs:  CBC: Recent Labs    12/06/20 0435 12/07/20 0510 12/08/20 0250 12/10/20 0316  WBC 12.7* 13.5* 14.5* 14.4*  HGB 10.4* 10.4* 11.6* 12.6  HCT 34.5* 34.0* 36.3 39.5  PLT 238 262 280 327    COAGS: No results for input(s): INR, APTT in the last 8760 hours.  BMP: Recent Labs    12/09/20 0217 12/10/20 0316 12/11/20 0551 12/12/20 0743  NA 138 134* 133* 134*  K 3.9 4.3 4.6 4.6  CL 105 102 104 105  CO2 25 22 21* 22  GLUCOSE 183* 166* 196* 214*  BUN 17 18 18 20   CALCIUM 9.0 9.1 9.1 9.1  CREATININE 0.87 0.78 0.87 0.89  GFRNONAA >60 >60 >60 >60    LIVER FUNCTION TESTS: Recent Labs    12/01/20 0913 12/02/20 0450  BILITOT 0.5 0.8  AST 234* 144*  ALT 284* 232*  ALKPHOS 110 94  PROT 5.7* 5.9*  ALBUMIN 2.8* 3.0*    TUMOR MARKERS: No results for input(s): AFPTM, CEA, CA199, CHROMGRNA in the last 8760 hours.  Assessment and  Plan:  Dysphagia Deconditioning Encephalopathy Scheduled for percutaneous gastric tube 8/30 in IR Afebrile Will check labs in am Order for Omnipaque per NG to opacify bowel--- in place Risks and benefits image guided gastrostomy tube placement was discussed with the patient including, but not limited to the need for a barium enema during the procedure, bleeding, infection, peritonitis and/or damage to adjacent structures.  All of the patient's questions were answered, patient is agreeable to proceed.  Consent signed and in chart.   Thank you for this interesting consult.  I greatly enjoyed meeting Courtney Davies and look forward to participating in their care.  A copy of this report was sent to the requesting provider on this date.  Electronically Signed: Lavonia Drafts, PA-C 12/12/2020, 9:52 AM   I spent a total of 40 Minutes    in face to face in clinical consultation, greater than 50% of which was counseling/coordinating care for percutaneous gastric tube placement

## 2020-12-13 ENCOUNTER — Inpatient Hospital Stay (HOSPITAL_COMMUNITY): Payer: Medicaid Other

## 2020-12-13 DIAGNOSIS — R1310 Dysphagia, unspecified: Secondary | ICD-10-CM | POA: Diagnosis not present

## 2020-12-13 DIAGNOSIS — I469 Cardiac arrest, cause unspecified: Secondary | ICD-10-CM | POA: Diagnosis not present

## 2020-12-13 DIAGNOSIS — G9340 Encephalopathy, unspecified: Secondary | ICD-10-CM | POA: Diagnosis not present

## 2020-12-13 DIAGNOSIS — Z7189 Other specified counseling: Secondary | ICD-10-CM | POA: Diagnosis not present

## 2020-12-13 HISTORY — PX: IR GASTROSTOMY TUBE MOD SED: IMG625

## 2020-12-13 LAB — PROTIME-INR
INR: 1.1 (ref 0.8–1.2)
Prothrombin Time: 13.8 seconds (ref 11.4–15.2)

## 2020-12-13 LAB — BASIC METABOLIC PANEL
Anion gap: 12 (ref 5–15)
BUN: 26 mg/dL — ABNORMAL HIGH (ref 6–20)
CO2: 20 mmol/L — ABNORMAL LOW (ref 22–32)
Calcium: 9.6 mg/dL (ref 8.9–10.3)
Chloride: 102 mmol/L (ref 98–111)
Creatinine, Ser: 0.97 mg/dL (ref 0.44–1.00)
GFR, Estimated: >60 mL/min (ref >60)
Glucose, Bld: 162 mg/dL — ABNORMAL HIGH (ref 70–99)
Potassium: 5 mmol/L (ref 3.5–5.1)
Sodium: 134 mmol/L — ABNORMAL LOW (ref 135–145)

## 2020-12-13 LAB — CBC
HCT: 42.5 % (ref 36.0–46.0)
Hemoglobin: 13.4 g/dL (ref 12.0–15.0)
MCH: 22.4 pg — ABNORMAL LOW (ref 26.0–34.0)
MCHC: 31.5 g/dL (ref 30.0–36.0)
MCV: 71.1 fL — ABNORMAL LOW (ref 80.0–100.0)
Platelets: 352 10*3/uL (ref 150–400)
RBC: 5.98 MIL/uL — ABNORMAL HIGH (ref 3.87–5.11)
RDW: 17 % — ABNORMAL HIGH (ref 11.5–15.5)
WBC: 13.6 10*3/uL — ABNORMAL HIGH (ref 4.0–10.5)
nRBC: 0 % (ref 0.0–0.2)

## 2020-12-13 LAB — GLUCOSE, CAPILLARY
Glucose-Capillary: 145 mg/dL — ABNORMAL HIGH (ref 70–99)
Glucose-Capillary: 214 mg/dL — ABNORMAL HIGH (ref 70–99)
Glucose-Capillary: 262 mg/dL — ABNORMAL HIGH (ref 70–99)

## 2020-12-13 LAB — MAGNESIUM: Magnesium: 2.1 mg/dL (ref 1.7–2.4)

## 2020-12-13 MED ORDER — ENOXAPARIN SODIUM 40 MG/0.4ML IJ SOSY
40.0000 mg | PREFILLED_SYRINGE | Freq: Every day | INTRAMUSCULAR | Status: DC
  Administered 2020-12-14 – 2020-12-20 (×7): 40 mg via SUBCUTANEOUS
  Filled 2020-12-13 (×9): qty 0.4

## 2020-12-13 MED ORDER — GLUCAGON HCL RDNA (DIAGNOSTIC) 1 MG IJ SOLR
INTRAMUSCULAR | Status: AC
  Filled 2020-12-13: qty 1

## 2020-12-13 MED ORDER — LIDOCAINE HCL 1 % IJ SOLN
INTRAMUSCULAR | Status: AC | PRN
  Administered 2020-12-13: 15 mL

## 2020-12-13 MED ORDER — FENTANYL CITRATE (PF) 100 MCG/2ML IJ SOLN
INTRAMUSCULAR | Status: AC | PRN
  Administered 2020-12-13: 25 ug via INTRAVENOUS
  Administered 2020-12-13 (×2): 50 ug via INTRAVENOUS

## 2020-12-13 MED ORDER — MIDAZOLAM HCL 2 MG/2ML IJ SOLN
INTRAMUSCULAR | Status: AC
  Filled 2020-12-13: qty 4

## 2020-12-13 MED ORDER — JEVITY 1.5 CAL/FIBER PO LIQD
1000.0000 mL | ORAL | Status: DC
  Administered 2020-12-14 – 2020-12-18 (×7): 1000 mL
  Filled 2020-12-13 (×9): qty 1000

## 2020-12-13 MED ORDER — LIDOCAINE HCL 1 % IJ SOLN
INTRAMUSCULAR | Status: AC
  Filled 2020-12-13: qty 20

## 2020-12-13 MED ORDER — GLUCAGON HCL (RDNA) 1 MG IJ SOLR
INTRAMUSCULAR | Status: AC | PRN
  Administered 2020-12-13: 1 mg via INTRAVENOUS

## 2020-12-13 MED ORDER — FENTANYL CITRATE (PF) 100 MCG/2ML IJ SOLN
INTRAMUSCULAR | Status: AC
  Filled 2020-12-13: qty 4

## 2020-12-13 MED ORDER — MIDAZOLAM HCL 2 MG/2ML IJ SOLN
INTRAMUSCULAR | Status: AC | PRN
  Administered 2020-12-13 (×3): 1 mg via INTRAVENOUS

## 2020-12-13 MED ORDER — FREE WATER
250.0000 mL | Freq: Four times a day (QID) | Status: DC
  Administered 2020-12-13 – 2020-12-20 (×27): 250 mL

## 2020-12-13 MED ORDER — IOHEXOL 240 MG/ML SOLN
50.0000 mL | Freq: Once | INTRAMUSCULAR | Status: AC | PRN
  Administered 2020-12-13: 15 mL via INTRAVENOUS

## 2020-12-13 MED ORDER — PROSOURCE TF PO LIQD
45.0000 mL | Freq: Three times a day (TID) | ORAL | Status: DC
  Administered 2020-12-14 – 2020-12-29 (×25): 45 mL
  Filled 2020-12-13 (×46): qty 45

## 2020-12-13 NOTE — Progress Notes (Signed)
  Speech Language Pathology Treatment: Dysphagia  Patient Details Name: Courtney Davies MRN: [id number] DOB: Jun 09, 1975 Today's Date: 12/13/2020 Time: 2800-3491 SLP Time Calculation (min) (ACUTE ONLY): 15 min  Assessment / Plan / Recommendation Clinical Impression  Pt seen at bedside to assess improvement in swallow function and readiness to begin PO intake. Pt was sleeping upon arrival of SLP, but awakened easily with verbal stim. Pt continues to be unable to demonstrate ability to follow commands, and did not vocalize during this session.   Oral care was attempted, using suction. Pt resistant to having lips moisturized with wet sponge. She would not open her mouth to allow cleaning of the interior oral cavity.   Volitional oral acceptance continues to be absent, with patient swatting at all attempts at PO trials. No cough noted during this session.  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   PEG placement continues to appear to be the appropriate course of action at this time. ST will continue efforts.       HPI HPI: Pt is a 45 y/o female admitted 8/18 with Vfib arrest with ROSC, COVID. CTA negative for PE. ETT 8/18-23 (failed inital attempt to extubate 8/19). MRI 8/21 consistent with hypoxic/ischemic injury. CXR 8/21 RLL infiltrate. PMT 8/23: continued aggressive care; family very hopeful for continued improvement. PMH: B12 deficiency, DM, OA, bronchitis.      SLP Plan  Continue with current plan of care       Recommendations  Diet recommendations: NPO Medication Administration: Via alternative means                Oral Care Recommendations: Oral care QID Follow up Recommendations: Skilled Nursing facility;24 hour supervision/assistance SLP Visit Diagnosis: Dysphagia, unspecified (R13.10) Plan: Continue with current plan of care       GO               Shahrzad Koble B. Quentin Ore, [facility], [place] Speech  Language Pathologist Office: [phone]  Shonna Chock 12/13/2020, 9:30 AM

## 2020-12-13 NOTE — Procedures (Signed)
Interventional Radiology Procedure Note  Procedure: G tube placement  Indication: Anoxic Encephalopathy. Dysphagia.  Findings: Please refer to procedural dictation for full description.  Complications: None  EBL: < 10 mL  Miachel Roux, MD [phone]

## 2020-12-13 NOTE — TOC Progression Note (Signed)
Transition of Care [facility]) - Progression Note    Patient Details  Name: Courtney Davies MRN: [id number] Date of Birth: 05/12/1975  Transition of Care [facility]) CM/SW [place], [place] Phone Number: 12/13/2020, 3:43 PM  Clinical Narrative:     CSW followed up with [facility] rehab in [place] to see if they can make SNF offer for patient. [facility] requested for CSW to fax over referral. [place] faxed over referral for [facility] to review. CSW awaiting callback to see if [facility] can make SNF bed offer for patient. CSW called patients daughter [name] and discussed SNF bed offers. CSW will follow up with [name] tomorrow on SNF choice once CSW has confirmation if [facility] can offer or not.CSW will continue to follow and assist with dc planning needs.  Expected Discharge Plan: [place] Barriers to Discharge: Continued Medical Work up  Expected Discharge Plan and Services Expected Discharge Plan: [employer] arrangements for the past 2 months: Single Family Home                                       Social Determinants of Health (SDOH) Interventions    Readmission Risk Interventions No flowsheet data found.

## 2020-12-13 NOTE — Progress Notes (Signed)
Physical Therapy Treatment Patient Details Name: Courtney Davies MRN: [id number] DOB: 03-11-1976 Today's Date: 12/13/2020    History of Present Illness Pt is a 45 y/o female admitted 8/18 with Vfib arrest with ROSC. Covid. CTA negative for PE. MRI 8/21 consistent with hypoxic/ischemic injury. 8/21 RLL infiltrate seen on CXR from aspiration pna. Intubated 8/18-23 (failed initial attempt to extubate 8/19). PMh includes: B12 deficiency, DM, OA, bronchitis, dysphagia.    PT Comments    Pt supine in bed found to have soiled bed pads under her. Explained need to change and provided multimodal cues for rolling, and when provided assist pt screams out in pain, with rolling to both sides. Pt requires total A for placement of pads. Attempt to come to seated EoB with total A however cannot tolerate pressure on buttock likely due to flexiseal placement. Returned to bed as pt to be transported for PEG placement. D/c plans remain appropriate. PT will continue to follow acutely.     Follow Up Recommendations  SNF;Supervision/Assistance - 24 hour     Equipment Recommendations  Wheelchair (measurements PT);Wheelchair cushion (measurements PT);Hospital bed;Other (comment) (TBD)       Precautions / Restrictions Precautions Precautions: Fall Precaution Comments: flexiseal, cortrak, B mitts Required Braces or Orthoses: Other Brace Other Brace: bil prevalon at rest Restrictions Weight Bearing Restrictions: No    Mobility  Bed Mobility Overal bed mobility: Needs Assistance Bed Mobility: Sidelying to Sit;Rolling;Sit to Sidelying Rolling: Total assist;+2 for physical assistance;+2 for safety/equipment Sidelying to sit: Total assist;+2 for physical assistance;+2 for safety/equipment     Sit to sidelying: +2 for physical assistance;Total assist;+2 for safety/equipment General bed mobility comments: total assist +2 to roll and replace soiled chuck pads, pt crying out with rolling, unable to determine exact  location of pain, provided maximal encouragement to come to sit EoB and moved in that direction with LE off bed with use of bed pad to reduce shear, however with bringing trunk to upright pt with retropulsive force to bring trunk back to bed surface, total A to bring LE back into bed and scoot up in bed.    Transfers                 General transfer comment: unable today due to pain      Modified Rankin (Stroke Patients Only) Modified Rankin (Stroke Patients Only) Pre-Morbid Rankin Score: No symptoms Modified Rankin: Severe disability     Balance       Sitting balance - Comments: unable to attain                                    Cognition Arousal/Alertness: Awake/alert Behavior During Therapy: Flat affect Overall Cognitive Status: Difficult to assess Area of Impairment: Attention                   Current Attention Level: Focused           General Comments: not following commands, responds to pain yelling "no, no, no"      Exercises Other Exercises Other Exercises: P/AA ROM to bil LE, heel cord stretch Other Exercises: PROM to BUEs, repositoined on pillows    General Comments General comments (skin integrity, edema, etc.): VSS on RA,      Pertinent Vitals/Pain Pain Assessment: Faces Faces Pain Scale: Hurts worst Pain Location: With rolling or anything which moves her buttock Pain Descriptors / Indicators: Discomfort;Grimacing;Crying;Moaning Pain Intervention(s):  Limited activity within patient's tolerance;Monitored during session;Repositioned     PT Goals (current goals can now be found in the care plan section) Acute Rehab PT Goals Patient Stated Goal: none stated PT Goal Formulation: Patient unable to participate in goal setting Time For Goal Achievement: 12/20/20 Potential to Achieve Goals: Fair Progress towards PT goals: Not progressing toward goals - comment (limited by pain)    Frequency    Min 2X/week       PT Plan Current plan remains appropriate       AM-PAC PT "6 Clicks" Mobility   Outcome Measure  Help needed turning from your back to your side while in a flat bed without using bedrails?: Total Help needed moving from lying on your back to sitting on the side of a flat bed without using bedrails?: Total Help needed moving to and from a bed to a chair (including a wheelchair)?: Total Help needed standing up from a chair using your arms (e.g., wheelchair or bedside chair)?: Total Help needed to walk in hospital room?: Total Help needed climbing 3-5 steps with a railing? : Total 6 Click Score: 6    End of Session   Activity Tolerance: Patient tolerated treatment well Patient left: in bed;with call bell/phone within reach;with restraints reapplied Nurse Communication: Mobility status PT Visit Diagnosis: Other abnormalities of gait and mobility (R26.89);Other symptoms and signs involving the nervous system (R29.898);Muscle weakness (generalized) (M62.81)     Time: 2003-7944 PT Time Calculation (min) (ACUTE ONLY): 22 min  Charges:  $Therapeutic Activity: 8-22 mins                     Korde Jeppsen B. Migdalia Dk PT, DPT Acute Rehabilitation Services Pager [phone] Office [phone]    [place] 12/13/2020, 3:58 PM

## 2020-12-13 NOTE — Progress Notes (Signed)
Daily Progress Note   Patient Name: Courtney Davies       Date: 12/13/2020 DOB: Nov 20, 1975  Age: 45 y.o. MRN#: [id number] Attending Physician: Flora Lipps, MD Primary Care Physician: Pcp, No Admit Date: 12/01/2020  Reason for Consultation/Follow-up: Establishing goals of care  Subjective: Medical records reviewed. Patient assessed at the bedside. She is non-verbal and in no acute distress. [place] in place.  I called patient's sister [name] as to provide support and follow up on goals of care conversation. Created space and opportunity for her thoughts and feelings on patient's current illness. [name] states the she is feeling much about patient's potential for improvement and she remains very cognizant of the fact that she will has a long load to recovery. She shares that she has received frequent and thorough updates and has also discussed the risks and benefits of PEG placement. She confirms her decision to proceed with the procedure today with the goal of discharging to SNF and focusing on rehabilitation efforts. Counseled on the benefits of ongoing palliative care involvement and outpatient referral. [name] agrees that this would be very helpful. She confirms that patient's mother can also be contacted, however she usually leans on [name] to ensure her understanding.     Questions and concerns addressed. PMT contact information provided. Patient's family was encouraged to call with additional needs or concerns.  Length of Stay: 12  Current Medications: Scheduled Meds:  . acetaminophen (TYLENOL) oral liquid 160 mg/5 mL  650 mg Per Tube Q6H  . amiodarone  200 mg Per Tube BID  . carvedilol  6.25 mg Per Tube BID WC  . feeding supplement (PROSource TF)  45 mL Per Tube BID  . free water  200 mL  Per Tube Q4H  . insulin aspart  0-20 Units Subcutaneous Q4H  . losartan  25 mg Per Tube Daily    Continuous Infusions: .  ceFAZolin (ANCEF) IV    . feeding supplement (VITAL 1.5 CAL) 1,000 mL (12/12/20 0015)    PRN Meds: albuterol, labetalol, ondansetron (ZOFRAN) IV  Physical Exam Vitals and nursing note reviewed.  Constitutional:      General: She is not in acute distress. Cardiovascular:     Rate and Rhythm: Normal rate.  Pulmonary:     Effort: Pulmonary effort is normal.  Neurological:  Mental Status: She is alert.  Psychiatric:        Speech: She is noncommunicative.            Vital Signs: BP 108/77 (BP Location: Left Arm)   Pulse 78   Temp (!) 97.2 F (36.2 C) (Axillary)   Resp 18   Ht 5' 6"  (1.676 m)   Wt 100 kg   SpO2 100%   BMI 35.58 kg/m  SpO2: SpO2: 100 % O2 Device: O2 Device: Room Air O2 Flow Rate: O2 Flow Rate (L/min): 10 L/min  Intake/output summary:  Intake/Output Summary (Last 24 hours) at 12/13/2020 1002 Last data filed at 12/13/2020 0600 Gross per 24 hour  Intake 250 ml  Output 1800 ml  Net -1550 ml   LBM: Last BM Date: 12/12/20 Baseline Weight: Weight: 100 kg Most recent weight: Weight: 100 kg       Palliative Assessment/Data: 30-40%      Patient Active Problem List   Diagnosis Date Noted  . Acute systolic heart failure ([place])   . Encephalopathy acute   . Type 1 diabetes mellitus without complication ([place]) 38/17/7116  . Hypertension 12/02/2020  . GERD (gastroesophageal reflux disease) 12/02/2020  . Anemia 12/02/2020  . Cardiac arrest [facility]) 12/01/2020    Palliative Care Assessment & Plan   Patient Profile: 45 y.o. female  with past medical history of HTN, DM type 2. B12 def GERD OA admitted on 12/01/2020 with post arrest downtime ~10 minutes, + COVID, NSTEMI, CTA negative PE. Previously had COVID infection ~4 weeks prior to arrest and reportedly recovered and returned to work. Beginning to be more alert and extubated 12/06/20.  MRI does show anoxic brain injury.   Assessment: Hypoxic encephalopathy s/p acute respiratory failure Acute systolic heart failure, compensated Deconditioning Goals of care conversation  Recommendations/Plan: Goal remains continuing aggressive interventions, including PEG placement, with hope of improvement and discharge to SNF  Patient's sister accepts outpatient palliative care referral  Goals of Care and Additional Recommendations: Limitations on Scope of Treatment: Full Scope Treatment  Prognosis:  Unable to determine  Discharge Planning: [place] for rehab with Palliative care service follow-up  Care plan was discussed with Patient's sister [name]  Total time: 15 minutes Greater than 50% of this time was spent in counseling and coordinating care related to the above assessment and plan.  Dorthy Cooler, PA-C Palliative Medicine Team Team phone # [phone]  Thank you for allowing the Palliative Medicine Team to assist in the care of this patient. Please utilize secure chat with additional questions, if there is no response within 30 minutes please call the above phone number.  Palliative Medicine Team providers are available by phone from 7am to 7pm daily and can be reached through the team cell phone.  Should this patient require assistance outside of these hours, please call the patient's attending physician.

## 2020-12-13 NOTE — Progress Notes (Signed)
PROGRESS NOTE  Courtney Davies RFF:[id number] DOB: May 31, 1975 DOA: 12/01/2020 PCP: Pcp, No   LOS: 12 days   Brief narrative:  45 years old female with past medical history of hypertension, vitamin B12 deficiency, GERD, obstructive sleep apnea, presented to the hospital on 12/01/2020 with ventricular fibrillation for around 10 minutes.  Patient was initially admitted to the ICU for V. fib arrest on 12/01/20 and ECG showed no ischemic changes.  On 12/06/2020, patient was successfully extubated to nasal cannula.  Patient has history of COVID positive.  CTA was negative for pulmonary embolism.  Patient was subsequently considered stable for transfer out of the ICU.  During hospitalization, patient was Continued on cortrak tube feeding.  She was thought to have a prolonged neurological recovery so plan for PEG tube has been discussed with the family.  Assessment/Plan:  Active Problems:   Cardiac arrest (HCC)   Type 1 diabetes mellitus without complication (HCC)   Hypertension   GERD (gastroesophageal reflux disease)   Anemia   Encephalopathy acute   Acute systolic heart failure (HCC)   Dysphagia   Goals of care, counseling/discussion  Ventricular fibrillation arrest with acute respiratory failure.  History of COVID-19 and asthma.  Hypoxic encephalopathy. Neurology recovery likely to take prolonged duration.    Will need ischemic work-up once neurological improvement.  Continue amiodarone,Coreg.  Acute systolic heart failure.  Currently compensated.  2D echocardiogram showed reduced LV function with LV ejection fraction of 40 to 45% with global hypokinesis..  Patient is on Coreg and losartan   Atrial fibrillation On Lovenox subcu anticoagulation. Continue amiodarone, Coreg.  Ultrasound of the thyroid shows some nodules..  Aspiration pneumonitis. Resolved after antibiotic  Debility, deconditioning.   PT, OT on board.  Recommending skilled nursing facility placement.  Nutrition.  On  cortrak tube tube feeding.  Considering PEG tube placement at this time  DVT prophylaxis: SCDs Start: 12/01/20 1303, Lovenox subcu   Code Status: Full code  Family Communication:   Spoke with patient's sister 12/11/2020  Status is: Inpatient  Remains inpatient appropriate because:IV treatments appropriate due to intensity of illness or inability to take PO and Inpatient level of care appropriate due to severity of illness  Dispo: The patient is from: Home              Anticipated d/c is to: Skilled nursing facility placement              Patient currently is not medically stable to d/c.   Difficult to place patient Yes  Consultants: PCCM  Procedures: Intubation and mechanical ventilation.   Extubation Cortrak tube tube placement  Anti-infectives:  None at this time  Subjective: Today, patient was seen and examined at bedside.  Appears to be alert awake, nursing staff reported that she was mildly verbal today.  Objective: Vitals:   12/13/20 0056 12/13/20 0427  BP: 114/73 108/77  Pulse: 89 78  Resp: 20 18  Temp: 97.6 F (36.4 C) (!) 97.2 F (36.2 C)  SpO2: 96% 100%    Intake/Output Summary (Last 24 hours) at 12/13/2020 1247 Last data filed at 12/13/2020 1055 Gross per 24 hour  Intake 250 ml  Output 1200 ml  Net -950 ml    Filed Weights   12/11/20 0200 12/12/20 0500 12/13/20 0429  Weight: 106 kg 102.1 kg 100 kg   Body mass index is 35.58 kg/m.   Physical Exam:  General:  Average built, not in obvious distress, opens eyes spontaneously, obese, mildly verbal with the nursing  staff.  Nodding her head when I asked whether she had pain. HENT:   No scleral pallor or icterus noted. Oral mucosa is moist.  Chest:  Clear breath sounds.  Diminished breath sounds bilaterally. No crackles or wheezes.  CVS: S1 &S2 heard. No murmur.  Regular rate and rhythm. Abdomen: Soft, nontender, nondistended.  Bowel sounds are heard.   Extremities: No cyanosis, clubbing or edema.   Peripheral pulses are palpable. Psych: Alert, awake and oriented, normal mood CNS: Alert awake opens eyes spontaneously, moving extremities, Skin: Warm and dry.  No rashes noted.   Data Review: I have personally reviewed the following laboratory data and studies,  CBC: Recent Labs  Lab 12/07/20 0510 12/08/20 0250 12/10/20 0316 12/12/20 0743 12/13/20 0458  WBC 13.5* 14.5* 14.4* 13.1* 13.6*  NEUTROABS  --  9.0*  --   --   --   HGB 10.4* 11.6* 12.6 12.9 13.4  HCT 34.0* 36.3 39.5 41.0 42.5  MCV 72.2* 71.0* 70.4* 72.1* 71.1*  PLT 262 280 327 324 209    Basic Metabolic Panel: Recent Labs  Lab 12/09/20 0217 12/10/20 0316 12/11/20 0551 12/12/20 0743 12/13/20 0458  NA 138 134* 133* 134* 134*  K 3.9 4.3 4.6 4.6 5.0  CL 105 102 104 105 102  CO2 25 22 21* 22 20*  GLUCOSE 183* 166* 196* 214* 162*  BUN 17 18 18 20  26*  CREATININE 0.87 0.78 0.87 0.89 0.97  CALCIUM 9.0 9.1 9.1 9.1 9.6  MG 1.8 1.9 1.8 1.8 2.1    Liver Function Tests: No results for input(s): AST, ALT, ALKPHOS, BILITOT, PROT, ALBUMIN in the last 168 hours.  No results for input(s): LIPASE, AMYLASE in the last 168 hours. No results for input(s): AMMONIA in the last 168 hours. Cardiac Enzymes: No results for input(s): CKTOTAL, CKMB, CKMBINDEX, TROPONINI in the last 168 hours. BNP (last 3 results) Recent Labs    12/01/20 0913  BNP 37.2     ProBNP (last 3 results) No results for input(s): PROBNP in the last 8760 hours.  CBG: Recent Labs  Lab 12/12/20 1053 12/12/20 1622 12/12/20 2038 12/13/20 0046 12/13/20 0403  GLUCAP 286* 159* 190* 214* 145*    No results found for this or any previous visit (from the past 240 hour(s)).     Studies: CT ABDOMEN WO CONTRAST  Result Date: 12/12/2020 CLINICAL DATA:  Dysphasia, preop planning for gastrostomy tube EXAM: CT ABDOMEN WITHOUT CONTRAST TECHNIQUE: Multidetector CT imaging of the abdomen was performed following the standard protocol without IV contrast.  COMPARISON:  08/07/2019 FINDINGS: Lower chest: Trace pleural fluid. Mild atelectasis posteriorly in the lung bases. Hepatobiliary: No focal liver abnormality is seen. No gallstones, gallbladder wall thickening, or biliary dilatation. Pancreas: Unremarkable. No pancreatic ductal dilatation or surrounding inflammatory changes. Spleen: Normal in size without focal abnormality. Adrenals/Urinary Tract: Adrenal glands unremarkable. Stable left renal cysts. No hydronephrosis or urolithiasis. Stomach/Bowel: Stomach is nondistended. There is a small window for percutaneous gastrostomy placement evident. Visualized small bowel and colon are nondilated. Feeding tube extends to the pylorus. Vascular/Lymphatic: Subcentimeter left para-aortic lymph nodes as before. Minimal calcified plaque in the proximal right common iliac artery. Other: No abdominal ascites.  No free air. Musculoskeletal: No acute or significant osseous findings. IMPRESSION: 1. Small window present for percutaneous gastrostomy placement. Consider preprocedure barium for colonic opacification. Electronically Signed   By: Lucrezia Europe M.D.   On: 12/12/2020 08:20   DG Abd 1 View  Result Date: 12/13/2020 CLINICAL DATA:  Gastrostomy placement  today.  Barium administered. EXAM: ABDOMEN - 1 VIEW COMPARISON:  CT Abdomen Pelvis, 12/11/2020. FINDINGS: Support lines: Enteric feeding tube, with tip at the proximal duodenum. Nonobstructive bowel gas pattern. Colonic opacification of administered enteric contrast. Sigmoid diverticulosis. No interval osseous abnormality. IMPRESSION: 1. Adequate preprocedural colonic opacification with barium. 2. Sigmoid diverticulosis. 3. Lines and tubes as above. Electronically Signed   By: Michaelle Birks M.D.   On: 12/13/2020 08:43      Flora Lipps, MD  [facility] 12/13/2020  If 7PM-7AM, please contact night-coverage

## 2020-12-13 NOTE — Progress Notes (Signed)
Nutrition Follow-up  DOCUMENTATION CODES:   Not applicable  INTERVENTION:   Resume tube feeding when PEG is ready to use:  Change to Jevity 1.5 at 50 ml/h with Prosource TF 45 ml TID Free water flushes 250 ml QID  Provides 1920 kcal, 110 gm protein, 1912 ml total free water daily.  NUTRITION DIAGNOSIS:   Inadequate oral intake related to acute illness as evidenced by NPO status.  Ongoing   GOAL:   Patient will meet greater than or equal to 90% of their needs  Met with TF  MONITOR:   TF tolerance, Labs, Diet advancement  REASON FOR ASSESSMENT:   Ventilator    ASSESSMENT:   45 yo female admitted post Vfib arrest, COVID+. PMH includes DM, GERD, dysphagia, B12 def  Patient has been tolerating TF via Cortrak tube without difficulty. Plans for PEG placement today. TF currently on hold.   SLP following for ability to complete swallow evaluation. As of this morning, patient is not ready to participate in swallow evaluation.   Labs reviewed. Sodium 134 CBG: 214-145 Medications reviewed and include ss novolog q 4 hours.  Palliative care team is following.  Diet Order:   Diet Order             Diet NPO time specified  Diet effective midnight                   EDUCATION NEEDS:   Not appropriate for education at this time  Skin:  Skin Assessment: Reviewed RN Assessment  Last BM:  8/29 rectal tube  Height:   Ht Readings from Last 1 Encounters:  12/01/20 5' 6"  (1.676 m)    Weight:   Wt Readings from Last 1 Encounters:  12/13/20 100 kg     BMI:  Body mass index is 35.58 kg/m.  Estimated Nutritional Needs:   Kcal:  1850-2050 kcals  Protein:  100-120 g  Fluid:  >/= 1.8 L   Lucas Mallow, RD, LDN, CNSC Please refer to Amion for contact information.

## 2020-12-14 DIAGNOSIS — Z978 Presence of other specified devices: Secondary | ICD-10-CM | POA: Diagnosis not present

## 2020-12-14 DIAGNOSIS — I469 Cardiac arrest, cause unspecified: Secondary | ICD-10-CM | POA: Diagnosis not present

## 2020-12-14 LAB — BASIC METABOLIC PANEL
Anion gap: 9 (ref 5–15)
BUN: 27 mg/dL — ABNORMAL HIGH (ref 6–20)
CO2: 21 mmol/L — ABNORMAL LOW (ref 22–32)
Calcium: 9.2 mg/dL (ref 8.9–10.3)
Chloride: 102 mmol/L (ref 98–111)
Creatinine, Ser: 1.01 mg/dL — ABNORMAL HIGH (ref 0.44–1.00)
GFR, Estimated: >60 mL/min (ref >60)
Glucose, Bld: 254 mg/dL — ABNORMAL HIGH (ref 70–99)
Potassium: 4.8 mmol/L (ref 3.5–5.1)
Sodium: 132 mmol/L — ABNORMAL LOW (ref 135–145)

## 2020-12-14 LAB — GLUCOSE, CAPILLARY
Glucose-Capillary: 282 mg/dL — ABNORMAL HIGH (ref 70–99)
Glucose-Capillary: 191 mg/dL — ABNORMAL HIGH (ref 70–99)
Glucose-Capillary: 248 mg/dL — ABNORMAL HIGH (ref 70–99)
Glucose-Capillary: 120 mg/dL — ABNORMAL HIGH (ref 70–99)
Glucose-Capillary: 212 mg/dL — ABNORMAL HIGH (ref 70–99)

## 2020-12-14 LAB — MAGNESIUM: Magnesium: 2 mg/dL (ref 1.7–2.4)

## 2020-12-14 MED ORDER — INSULIN ASPART 100 UNIT/ML IJ SOLN
4.0000 [IU] | INTRAMUSCULAR | Status: DC
  Administered 2020-12-14 – 2020-12-15 (×5): 4 [IU] via SUBCUTANEOUS

## 2020-12-14 NOTE — Progress Notes (Signed)
Referring Physician(s): Dr Louanne Belton  Supervising Physician: Ruthann Cancer  Patient Status:  [facility] - In-pt  Chief Complaint:  Percutaneous G tube placed in IR 8/30  Subjective:  Resting- seems comfortable Non verbal Afebrile  Allergies: Other, Codeine, Latex, and Nitrofurantoin  Medications: Prior to Admission medications   Medication Sig Start Date End Date Taking? Authorizing Provider  folic acid (FOLVITE) 1 MG tablet Take 1 mg by mouth daily.    [provider]  HYDROcodone-acetaminophen (NORCO) 7.5-325 MG tablet Take 1 tablet by mouth 3 (three) times daily as needed for pain. 11/07/20   [provider]  LINZESS 290 MCG CAPS capsule Take 290 mcg by mouth daily. 12/01/20   [provider]  meloxicam (MOBIC) 15 MG tablet Take 15 mg by mouth daily. 06/07/20   [provider]  nirmatrelvir & ritonavir (PAXLOVID) 20 x 150 MG & 10 x 100MG TBPK Take 100-150 mg by mouth See admin instructions. follow package directions 11/10/20   [provider]  pantoprazole (PROTONIX) 40 MG tablet Take 40 mg by mouth 2 (two) times daily. 07/07/20   [provider]  phentermine (ADIPEX-P) 37.5 MG tablet Take 37.5 mg by mouth daily. 07/12/20   [provider]  PROAIR HFA 108 (90 Base) MCG/ACT inhaler Inhale 1-2 puffs into the lungs every 4 (four) hours as needed. 07/25/20   [provider]  tiZANidine (ZANAFLEX) 4 MG tablet Take 4 mg by mouth at bedtime. 09/27/20   [provider]  TRULICITY 4.5 ZW/2.5EN SOPN Inject 4.5 mg into the skin once a week. 10/26/20   [provider]  Vitamin D, Ergocalciferol, (DRISDOL) 1.25 MG (50000 UNIT) CAPS capsule Take 50,000 Units by mouth once a week. 10/26/20   [provider]     Vital Signs: BP 128/86   Pulse 94   Temp 98 F (36.7 C) (Axillary)   Resp 15   Ht 5' 6"  (1.676 m)   Wt 221 lb 1.9 oz (100.3 kg)   SpO2 97%   BMI 35.69 kg/m   Physical Exam Vitals  reviewed.  Abdominal:     General: Bowel sounds are normal.  Skin:    General: Skin is warm.     Comments: Site is clean and dry G tube intact NT no bleeding No hematoma    Imaging: CT ABDOMEN WO CONTRAST  Result Date: 12/12/2020 CLINICAL DATA:  Dysphasia, preop planning for gastrostomy tube EXAM: CT ABDOMEN WITHOUT CONTRAST TECHNIQUE: Multidetector CT imaging of the abdomen was performed following the standard protocol without IV contrast. COMPARISON:  08/07/2019 FINDINGS: Lower chest: Trace pleural fluid. Mild atelectasis posteriorly in the lung bases. Hepatobiliary: No focal liver abnormality is seen. No gallstones, gallbladder wall thickening, or biliary dilatation. Pancreas: Unremarkable. No pancreatic ductal dilatation or surrounding inflammatory changes. Spleen: Normal in size without focal abnormality. Adrenals/Urinary Tract: Adrenal glands unremarkable. Stable left renal cysts. No hydronephrosis or urolithiasis. Stomach/Bowel: Stomach is nondistended. There is a small window for percutaneous gastrostomy placement evident. Visualized small bowel and colon are nondilated. Feeding tube extends to the pylorus. Vascular/Lymphatic: Subcentimeter left para-aortic lymph nodes as before. Minimal calcified plaque in the proximal right common iliac artery. Other: No abdominal ascites.  No free air. Musculoskeletal: No acute or significant osseous findings. IMPRESSION: 1. Small window present for percutaneous gastrostomy placement. Consider preprocedure barium for colonic opacification. Electronically Signed   By: Lucrezia Europe M.D.   On: 12/12/2020 08:20   DG Abd 1 View  Result Date: 12/13/2020 CLINICAL DATA:  Gastrostomy placement today.  Barium administered. EXAM: ABDOMEN - 1 VIEW COMPARISON:  CT Abdomen Pelvis, 12/11/2020. FINDINGS: Support lines: Enteric feeding tube, with tip at the proximal duodenum. Nonobstructive bowel gas pattern. Colonic opacification of administered enteric contrast. Sigmoid  diverticulosis. No interval osseous abnormality. IMPRESSION: 1. Adequate preprocedural colonic opacification with barium. 2. Sigmoid diverticulosis. 3. Lines and tubes as above. Electronically Signed   By: Michaelle Birks M.D.   On: 12/13/2020 08:43   IR GASTROSTOMY TUBE MOD SED  Result Date: 12/14/2020 INDICATION: Anoxic encephalopathy Dysphagia EXAM: Fluoroscopic guided gastrostomy tube placement MEDICATIONS: Ancef 2 g IV; Antibiotics were administered within 1 hour of the procedure. Glucagon 1 mg IV ANESTHESIA/SEDATION: Versed 3 mg IV; Fentanyl 125 mcg IV Moderate Sedation Time:  32 minutes The patient was continuously monitored during the procedure by the interventional radiology nurse under my direct supervision. CONTRAST:  15 mL of Omnipaque 240-administered into the gastric lumen. FLUOROSCOPY TIME:  Fluoroscopy Time: 2 minutes 30 seconds (21 mGy). COMPLICATIONS: None immediate. PROCEDURE: Informed written consent was obtained from the patient after a thorough discussion of the procedural risks, benefits and alternatives. All questions were addressed. Maximal Sterile Barrier Technique was utilized including caps, mask, sterile gowns, sterile gloves, sterile drape, hand hygiene and skin antiseptic. A timeout was performed prior to the initiation of the procedure. An orogastric tube was placed with fluoroscopic guidance. The anterior abdomen was prepped and draped in sterile fashion. Ultrasound evaluation of the left upper quadrant was performed to confirm the position of the liver. The skin and subcutaneous tissues were anesthetized with 1% lidocaine. Single gastropexy was placed. 17 gauge needle was directed into the distended stomach with fluoroscopic guidance. A wire was advanced into the stomach. 9-French vascular sheath was placed and the orogastric tube was snared using a Gooseneck snare device. The orogastric tube and snare were pulled out of the patient's mouth. The snare device was connected to a  20-French gastrostomy tube. The snare device and gastrostomy tube were pulled through the patient's mouth and out the anterior abdominal wall. The gastrostomy tube was cut to an appropriate length. Contrast injection through gastrostomy tube confirmed placement within the stomach. The gastropexy was removed. Fluoroscopic images were obtained for documentation. The gastrostomy tube was flushed with normal saline. IMPRESSION: 70 French pull-through gastrostomy tube placed. Electronically Signed   By: Miachel Roux M.D.   On: 12/14/2020 08:33    Labs:  CBC: Recent Labs    12/08/20 0250 12/10/20 0316 12/12/20 0743 12/13/20 0458  WBC 14.5* 14.4* 13.1* 13.6*  HGB 11.6* 12.6 12.9 13.4  HCT 36.3 39.5 41.0 42.5  PLT 280 327 324 352    COAGS: Recent Labs    12/13/20 0458  INR 1.1    BMP: Recent Labs    12/11/20 0551 12/12/20 0743 12/13/20 0458 12/14/20 0239  NA 133* 134* 134* 132*  K 4.6 4.6 5.0 4.8  CL 104 105 102 102  CO2 21* 22 20* 21*  GLUCOSE 196* 214* 162* 254*  BUN 18 20 26* 27*  CALCIUM 9.1 9.1 9.6 9.2  CREATININE 0.87 0.89 0.97 1.01*  GFRNONAA >60 >60 >60 >60    LIVER FUNCTION TESTS: Recent Labs    12/01/20 0913 12/02/20 0450  BILITOT 0.5 0.8  AST 234* 144*  ALT 284* 232*  ALKPHOS 110 94  PROT 5.7* 5.9*  ALBUMIN 2.8* 3.0*    Assessment and Plan:  Percutaneous G tube in place May use now  Electronically Signed: Lavonia Drafts, PA-C 12/14/2020,  9:17 AM   I spent a total of 15 Minutes at the the patient's bedside AND on the patient's hospital floor or unit, greater than 50% of which was counseling/coordinating care for G tube

## 2020-12-14 NOTE — Progress Notes (Signed)
   12/14/20 1454  Clinical Encounter Type  Visited With Patient and family together  Visit Type Initial;Spiritual support  Referral From Nurse  Consult/Referral To Chaplain   Chaplain responded. Consulted with RN Poonam regarding notarizing the POA document for the patient's mother, [name]. Ms. [name] needs access to the patient's bank account to pay the patient's household bills since the patient's sons are at home. This chaplain advised Ms. [name] that my department is unable to notarize POA: however, we can notarize HCPOA. This Chaplain provided AD education, and the patient verbalized yes to her mother being her 59. The mother declined the HCPOA document. This chaplain provided spiritual support and emotional support to the patient and Ms. [name]. Our visit ended with a prayer. This note was prepared by Jeanine Luz, M.Div..  For questions please contact by phone [phone].

## 2020-12-14 NOTE — Progress Notes (Addendum)
PROGRESS NOTE  Courtney Davies QTM:[id number] DOB: [birth year] DOA: 12/01/2020 PCP: Pcp, No   LOS: 13 days   Brief narrative:  45 years old female with past medical history of hypertension, vitamin B12 deficiency, GERD, obstructive sleep apnea, presented to the hospital on 12/01/2020 with ventricular fibrillation for around 10 minutes.  Patient was initially admitted to the ICU for V. fib arrest on 12/01/20 and ECG showed no ischemic changes.  On 12/06/2020, patient was successfully extubated to nasal cannula.  Patient has history of COVID positive.  CTA was negative for pulmonary embolism.  Patient was subsequently considered stable for transfer out of the ICU.  During hospitalization, patient was Continued on cortrak tube feeding.  She was thought to have a prolonged neurological recovery so plan for PEG tube has been discussed with the family.  Assessment/Plan:    Ventricular fibrillation arrest with acute respiratory failure.  History of COVID-19 and asthma.  Hypoxic encephalopathy. Neurology recovery likely to take prolonged duration.     -Seen and followed by cardiology this admission, not appropriate for ischemic work-up at this time, to be considered following neurological improvement  -Continue amiodarone,Coreg. -Electrolytes within normal range  Dysphagia -Following anoxic brain injury -Status post palliative care meeting, decision made for PEG tube placement, will discontinue Cortrak today and resume tube feeds via PEG  Acute systolic heart failure.  Currently compensated.  2D echocardiogram showed reduced LV function with LV ejection fraction of 40 to 45% with global hypokinesis..   -Continue coreg and losartan   Atrial fibrillation On Lovenox subcu anticoagulation. Continue amiodarone, Coreg.  Ultrasound of the thyroid shows some nodules..  DM -Trulicity on hold, hemoglobin A1c was 6.9, will add NovoLog 4 units 3 times daily  Aspiration pneumonitis. Resolved   Debility,  deconditioning.   PT, OT on board.  Recommending skilled nursing facility placement.  Nutrition.  Start tube feeds via PEG tube today  DVT prophylaxis: enoxaparin (LOVENOX) injection 40 mg Start: 12/14/20 1000 SCDs Start: 12/01/20 1303, Lovenox subcu   Code Status: Full code Family Communication: No family at bedside  Status is: Inpatient  Remains inpatient appropriate because:IV treatments appropriate due to intensity of illness or inability to take PO and Inpatient level of care appropriate due to severity of illness  Dispo: The patient is from: Home              Anticipated d/c is to: Skilled nursing facility placement              Patient currently is not medically stable to d/c.   Difficult to place patient Yes  Consultants: PCCM  Procedures: Intubation and mechanical ventilation.   Extubation Cortrak tube tube placement PEG tube placed in interventional radiology 8/30  Anti-infectives:  None at this time  Subjective: -No events overnight, had PEG tube placed yesterday afternoon  Objective: Vitals:   12/13/20 1520 12/14/20 0849  BP: 128/86 125/79  Pulse: 94 89  Resp: 15   Temp:    SpO2: 97% 98%    Intake/Output Summary (Last 24 hours) at 12/14/2020 1130 Last data filed at 12/14/2020 0526 Gross per 24 hour  Intake --  Output 575 ml  Net -575 ml   Filed Weights   12/12/20 0500 12/13/20 0429 12/14/20 0500  Weight: 102.1 kg 100 kg 100.3 kg   Body mass index is 35.69 kg/m.   Physical Exam:  General: Obese female laying in bed, eyes open, awake and alert, mumbles few words, tracks  HEENT: NG tube with tube  feeds infusing CVS: S1-S2, regular rate rhythm Lungs: Decreased breath sounds bilaterally Abdomen: Obese, soft, nontender, PEG tube noted Extremities: No edema Neuro: Awake alert, mumbles, does not follow commands  Skin: Warm and dry   Data Review: I have personally reviewed the following laboratory data and studies,  CBC: Recent Labs  Lab  12/11/20 0250 12/10/20 0316 12/12/20 0743 12/13/20 0458  WBC 14.5* 14.4* 13.1* 13.6*  NEUTROABS 9.0*  --   --   --   HGB 11.6* 12.6 12.9 13.4  HCT 36.3 39.5 41.0 42.5  MCV 71.0* 70.4* 72.1* 71.1*  PLT 280 327 324 725   Basic Metabolic Panel: Recent Labs  Lab 12/10/20 0316 12/11/20 0551 12/12/20 0743 12/13/20 0458 12/14/20 0239  NA 134* 133* 134* 134* 132*  K 4.3 4.6 4.6 5.0 4.8  CL 102 104 105 102 102  CO2 22 21* 22 20* 21*  GLUCOSE 166* 196* 214* 162* 254*  BUN 18 18 20  26* 27*  CREATININE 0.78 0.87 0.89 0.97 1.01*  CALCIUM 9.1 9.1 9.1 9.6 9.2  MG 1.9 1.8 1.8 2.1 2.0   Liver Function Tests: No results for input(s): AST, ALT, ALKPHOS, BILITOT, PROT, ALBUMIN in the last 168 hours.  No results for input(s): LIPASE, AMYLASE in the last 168 hours. No results for input(s): AMMONIA in the last 168 hours. Cardiac Enzymes: No results for input(s): CKTOTAL, CKMB, CKMBINDEX, TROPONINI in the last 168 hours. BNP (last 3 results) Recent Labs    12/01/20 0913  BNP 37.2    ProBNP (last 3 results) No results for input(s): PROBNP in the last 8760 hours.  CBG: Recent Labs  Lab 12/13/20 0046 12/13/20 0403 12/13/20 2249 12/14/20 0447 12/14/20 0729  GLUCAP 214* 145* 262* 282* 191*   No results found for this or any previous visit (from the past 240 hour(s)).     Studies: DG Abd 1 View  Result Date: 12/13/2020 CLINICAL DATA:  Gastrostomy placement today.  Barium administered. EXAM: ABDOMEN - 1 VIEW COMPARISON:  CT Abdomen Pelvis, 12/11/2020. FINDINGS: Support lines: Enteric feeding tube, with tip at the proximal duodenum. Nonobstructive bowel gas pattern. Colonic opacification of administered enteric contrast. Sigmoid diverticulosis. No interval osseous abnormality. IMPRESSION: 1. Adequate preprocedural colonic opacification with barium. 2. Sigmoid diverticulosis. 3. Lines and tubes as above. Electronically Signed   By: Michaelle Birks M.D.   On: 12/13/2020 08:43   IR  GASTROSTOMY TUBE MOD SED  Result Date: 12/14/2020 INDICATION: Anoxic encephalopathy Dysphagia EXAM: Fluoroscopic guided gastrostomy tube placement MEDICATIONS: Ancef 2 g IV; Antibiotics were administered within 1 hour of the procedure. Glucagon 1 mg IV ANESTHESIA/SEDATION: Versed 3 mg IV; Fentanyl 125 mcg IV Moderate Sedation Time:  32 minutes The patient was continuously monitored during the procedure by the interventional radiology nurse under my direct supervision. CONTRAST:  15 mL of Omnipaque 240-administered into the gastric lumen. FLUOROSCOPY TIME:  Fluoroscopy Time: 2 minutes 30 seconds (21 mGy). COMPLICATIONS: None immediate. PROCEDURE: Informed written consent was obtained from the patient after a thorough discussion of the procedural risks, benefits and alternatives. All questions were addressed. Maximal Sterile Barrier Technique was utilized including caps, mask, sterile gowns, sterile gloves, sterile drape, hand hygiene and skin antiseptic. A timeout was performed prior to the initiation of the procedure. An orogastric tube was placed with fluoroscopic guidance. The anterior abdomen was prepped and draped in sterile fashion. Ultrasound evaluation of the left upper quadrant was performed to confirm the position of the liver. The skin and subcutaneous tissues were anesthetized  with 1% lidocaine. Single gastropexy was placed. 17 gauge needle was directed into the distended stomach with fluoroscopic guidance. A wire was advanced into the stomach. 9-French vascular sheath was placed and the orogastric tube was snared using a Gooseneck snare device. The orogastric tube and snare were pulled out of the patient's mouth. The snare device was connected to a 20-French gastrostomy tube. The snare device and gastrostomy tube were pulled through the patient's mouth and out the anterior abdominal wall. The gastrostomy tube was cut to an appropriate length. Contrast injection through gastrostomy tube confirmed  placement within the stomach. The gastropexy was removed. Fluoroscopic images were obtained for documentation. The gastrostomy tube was flushed with normal saline. IMPRESSION: 50 French pull-through gastrostomy tube placed. Electronically Signed   By: Miachel Roux M.D.   On: 12/14/2020 08:33      Domenic Polite, MD  [facility] 12/14/2020  If 7PM-7AM, please contact night-coverage

## 2020-12-14 NOTE — Progress Notes (Addendum)
Inpatient Diabetes Program Recommendations  AACE/ADA: New Consensus Statement on Inpatient Glycemic Control (2015)  Target Ranges:  Prepandial:   less than 140 mg/dL      Peak postprandial:   less than 180 mg/dL (1-2 hours)      Critically ill patients:  140 - 180 mg/dL   Lab Results  Component Value Date   GLUCAP 191 (H) 12/14/2020   HGBA1C 6.9 (H) 12/02/2020    Review of Glycemic Control Results for Courtney Davies, Courtney Davies (MRN [id number]) as of 12/14/2020 11:04  Ref. Range 12/13/2020 00:46 12/13/2020 04:03 12/13/2020 22:49 12/14/2020 04:47 12/14/2020 07:29  Glucose-Capillary Latest Ref Range: 70 - 99 mg/dL 214 (H) 145 (H) 262 (H) 282 (H) 191 (H)   Diabetes history: DM 2 Outpatient Diabetes medications: Trulicity 4.5 mg Weekly Current orders for Inpatient glycemic control:  Novolog 0-20 units Q4 hours  Jevity 50 ml/hour A1c 6.9% on 8/19  Inpatient Diabetes Program Recommendations:    - consider adding Novolog 4 units tid Tube Feed Coverage  Thanks,  Tama Headings RN, MSN, BC-ADM Inpatient Diabetes Coordinator Team Pager [phone] (8a-5p)

## 2020-12-14 NOTE — Therapy (Signed)
Occupational Therapy Treatment Patient Details Name: Courtney Davies MRN: [id number] DOB: 1976-01-04 Today's Date: 12/14/2020    History of present illness Pt is a 45 y/o female admitted 8/18 with Vfib arrest with ROSC. Covid. CTA negative for PE. MRI 8/21 consistent with hypoxic/ischemic injury. 8/21 RLL infiltrate seen on CXR from aspiration pna. Intubated 8/18-23 (failed initial attempt to extubate 8/19). PMh includes: B12 deficiency, DM, OA, bronchitis, dysphagia.   OT comments  Pt was seen for limited bedside OT treatment session today to consist of pt education, brief UE therapeutic exercise and attempted grooming activity prior to pt becoming agitated and combative with both tasks. Pt is currently limited by agitation, will cont acute OT toward plan of care and goals to assist with increased independence and participation in therapeutic ADL retraining as pt tolerates.   Follow Up Recommendations  SNF;Supervision/Assistance - 24 hour    Equipment Recommendations  Other (comment) (Defer to next venue)    Recommendations for Other Services      Precautions / Restrictions Precautions Precautions: Fall Precaution Comments: flexiseal, cortrak, B mitts Required Braces or Orthoses: Other Brace Other Brace: bil prevalon at rest       Mobility Bed Mobility    Transfers   General transfer comment: Unable today due to pt agitation/combative    Balance       ADL either performed or assessed with clinical judgement   ADL Overall ADL's : Needs assistance/impaired Eating/Feeding: NPO     General ADL Comments: Prior to treatment session, pt was educated in role of OT and reason for treatement session.Limited treatment session today with focus on bilateral UE therapeutic exercise w/ goal of increased strength in preparation for increased participation with ADL's, transfers and cognition/following commands. Pt tolerated limited R/L UE ther ex (~7-8 reps) prior to becoming agitated  and combative. Pt was noted to be rubbing her face with mitts on, therefore OT wet a wash cloth in an attemopt to let pt perform some grooming/washing her face at bed level. Again, pt was agitated and began to become combative, waving her arms and fists at OT. Pt was repositioned in bed and treatment concluded. RN and NT were notified.    Cognition Arousal/Alertness: Awake/alert Behavior During Therapy: Flat affect Overall Cognitive Status: Difficult to assess Area of Impairment: Attention   Current Attention Level: Focused     General Comments: not following commands, Agitated and shaking head "no" at times, swinging arms/fists/combative       Exercises Exercises:  (See ADL notes above. Limited R/L UE ther ex performed before pt became agitated and combative.) Other Exercises Other Exercises: Limited P/ROM to bilateral UE's prior to pt becomming agitated and combative (!7-8 reps each)   Shoulder Instructions       General Comments  Pt shaking her head "No" at times and becoming combative with bilateral UE therapeutic ex and grooming activity today. RN staff made aware.    Pertinent Vitals/ Pain       Faces Pain Scale: Hurts a little bit Pain Location: with UE movement/ther ex. Pain Descriptors / Indicators: Discomfort;Grimacing (Shaking her head no at times, agitated) Pain Intervention(s): Limited activity within patient's tolerance;Monitored during session  Home Living   Prior Functioning/Environment    Frequency  Min 3X/week     Progress Toward Goals  OT Goals(current goals can now be found in the care plan section)     Acute Rehab OT Goals OT Goal Formulation: Patient unable to participate in goal setting Time For  Goal Achievement: 12/20/20 Potential to Achieve Goals: [place] Discharge plan remains appropriate;Frequency remains appropriate       AM-PAC OT "6 Clicks" Daily Activity     Outcome Measure   Help from another person eating meals?: Total  (NPO) Help from another person taking care of personal grooming?: Total Help from another person toileting, which includes using toliet, bedpan, or urinal?: Total Help from another person bathing (including washing, rinsing, drying)?: Total Help from another person to put on and taking off regular upper body clothing?: Total Help from another person to put on and taking off regular lower body clothing?: Total 6 Click Score: 6    End of Session    OT Visit Diagnosis: Other abnormalities of gait and mobility (R26.89);Muscle weakness (generalized) (M62.81);Other symptoms and signs involving the nervous system (R29.898);Other symptoms and signs involving cognitive function   Activity Tolerance Treatment limited secondary to agitation   Patient Left in bed;with call bell/phone within reach;with bed alarm set   Nurse Communication Other (comment) (Pt agitated and combative during brief OT session today)        Time: 6384-6659 OT Time Calculation (min): 11 min  Charges: OT General Charges $OT Visit: 1 Visit OT Treatments $Therapeutic Activity: 8-22 mins   Anaisa Radi Beth Dixon, OTR/L 12/14/2020, 10:27 AM

## 2020-12-14 NOTE — Plan of Care (Signed)
  Problem: Clinical Measurements: Goal: Ability to maintain clinical measurements within normal limits will improve Outcome: Progressing   Problem: Clinical Measurements: Goal: Diagnostic test results will improve Outcome: Progressing   Problem: Clinical Measurements: Goal: Respiratory complications will improve Outcome: Progressing   Problem: Clinical Measurements: Goal: Cardiovascular complication will be avoided Outcome: Progressing   Problem: Nutrition: Goal: Adequate nutrition will be maintained Outcome: Progressing   Problem: Elimination: Goal: Will not experience complications related to bowel motility Outcome: Progressing   Problem: Elimination: Goal: Will not experience complications related to urinary retention Outcome: Progressing   Problem: Pain Managment: Goal: General experience of comfort will improve Outcome: Progressing   Problem: Skin Integrity: Goal: Risk for impaired skin integrity will decrease Outcome: Progressing

## 2020-12-14 NOTE — TOC Progression Note (Signed)
Transition of Care [facility]) - Progression Note    Patient Details  Name: Courtney Davies MRN: [id number] Date of Birth: 1975/05/27  Transition of Care [facility]) CM/SW [place], [place] Phone Number: 12/14/2020, 5:12 PM  Clinical Narrative:     CSW spoke with [facility] rehab in [place] who confirmed they cannot offer SNF bed for patient. CSW spoke with patients daughter and updated her. Patients daughter would like to discuss two SNF bed offers that CSW provided with family. CSW will follow back up with patients daughter in the morning. CSW will continue to follow and assist with dc planning needs.  Expected Discharge Plan: [place] Barriers to Discharge: Continued Medical Work up  Expected Discharge Plan and Services Expected Discharge Plan: [place] arrangements for the past 2 months: Single Family Home                                       Social Determinants of Health (SDOH) Interventions    Readmission Risk Interventions No flowsheet data found.

## 2020-12-15 DIAGNOSIS — I469 Cardiac arrest, cause unspecified: Secondary | ICD-10-CM | POA: Diagnosis not present

## 2020-12-15 LAB — CBC
HCT: 40.7 % (ref 36.0–46.0)
Hemoglobin: 12.9 g/dL (ref 12.0–15.0)
MCH: 22.6 pg — ABNORMAL LOW (ref 26.0–34.0)
MCHC: 31.7 g/dL (ref 30.0–36.0)
MCV: 71.4 fL — ABNORMAL LOW (ref 80.0–100.0)
Platelets: 343 10*3/uL (ref 150–400)
RBC: 5.7 MIL/uL — ABNORMAL HIGH (ref 3.87–5.11)
RDW: 16 % — ABNORMAL HIGH (ref 11.5–15.5)
WBC: 11.5 10*3/uL — ABNORMAL HIGH (ref 4.0–10.5)
nRBC: 0 % (ref 0.0–0.2)

## 2020-12-15 LAB — BASIC METABOLIC PANEL
Anion gap: 9 (ref 5–15)
BUN: 25 mg/dL — ABNORMAL HIGH (ref 6–20)
CO2: 20 mmol/L — ABNORMAL LOW (ref 22–32)
Calcium: 9.2 mg/dL (ref 8.9–10.3)
Chloride: 101 mmol/L (ref 98–111)
Creatinine, Ser: 0.92 mg/dL (ref 0.44–1.00)
GFR, Estimated: >60 mL/min (ref >60)
Glucose, Bld: 201 mg/dL — ABNORMAL HIGH (ref 70–99)
Potassium: 4.6 mmol/L (ref 3.5–5.1)
Sodium: 130 mmol/L — ABNORMAL LOW (ref 135–145)

## 2020-12-15 LAB — GLUCOSE, CAPILLARY
Glucose-Capillary: 187 mg/dL — ABNORMAL HIGH (ref 70–99)
Glucose-Capillary: 193 mg/dL — ABNORMAL HIGH (ref 70–99)
Glucose-Capillary: 218 mg/dL — ABNORMAL HIGH (ref 70–99)
Glucose-Capillary: 221 mg/dL — ABNORMAL HIGH (ref 70–99)
Glucose-Capillary: 229 mg/dL — ABNORMAL HIGH (ref 70–99)

## 2020-12-15 LAB — SARS CORONAVIRUS 2 (TAT 6-24 HRS): SARS Coronavirus 2: POSITIVE — AB

## 2020-12-15 MED ORDER — INSULIN GLARGINE-YFGN 100 UNIT/ML ~~LOC~~ SOLN
10.0000 [IU] | Freq: Two times a day (BID) | SUBCUTANEOUS | Status: DC
  Administered 2020-12-15 – 2020-12-16 (×3): 10 [IU] via SUBCUTANEOUS
  Filled 2020-12-15 (×4): qty 0.1

## 2020-12-15 NOTE — Progress Notes (Signed)
Physical Therapy Treatment Patient Details Name: Courtney Davies MRN: [id number] DOB: 1976-04-01 Today's Date: 12/15/2020    History of Present Illness Pt is a 45 y/o female admitted 8/18 with Vfib arrest with ROSC. Covid. CTA negative for PE. MRI 8/21 consistent with hypoxic/ischemic injury. 8/21 RLL infiltrate seen on CXR from aspiration pna. Intubated 8/18-23 (failed initial attempt to extubate 8/19). PMh includes: B12 deficiency, DM, OA, bronchitis, dysphagia.    PT Comments    Focused session on progressing pt to get OOB to recliner using maximove today. Pt tolerated the session but would shake her head no on occasion when she appeared to experience pain when rolling. Upon arrival, nursing was beginning to roll pt to perform pericare as she was lying in urine saturated sheets, indicating poor pt self-awareness. Therapists assisted with pt needing TA x2 to roll either direction due to poor ability to follow simple commands. Will continue to follow acutely. Current recommendations remain appropriate.   Follow Up Recommendations  SNF;Supervision/Assistance - 24 hour     Equipment Recommendations  Wheelchair (measurements PT);Wheelchair cushion (measurements PT);Hospital bed;Other (comment) (TBD)    Recommendations for Other Services       Precautions / Restrictions Precautions Precautions: Fall Precaution Comments: flexiseal, G tube, abdominal binder, B mitts Required Braces or Orthoses: Other Brace Other Brace: bil prevalon at rest Restrictions Weight Bearing Restrictions: No    Mobility  Bed Mobility Overal bed mobility: Needs Assistance Bed Mobility: Rolling Rolling: Total assist;+2 for physical assistance;+2 for safety/equipment         General bed mobility comments: Cues to pull on RNs or therapists to asisst with roll, but min success. TAx2 to roll either direction for pericare, changing of sheets due to being saturated with urine, and to place hoyer lift pad.     Transfers Overall transfer level: Needs assistance               General transfer comment: Dependent hoyer lift with +2 for safety bed > recliner.  Ambulation/Gait             General Gait Details: Deferred   Stairs             Wheelchair Mobility    Modified Rankin (Stroke Patients Only) Modified Rankin (Stroke Patients Only) Pre-Morbid Rankin Score: No symptoms Modified Rankin: Severe disability     Balance                                            Cognition Arousal/Alertness: Awake/alert Behavior During Therapy: Flat affect;Agitated Overall Cognitive Status: Difficult to assess Area of Impairment: Attention;Following commands;Safety/judgement;Awareness;Problem solving                   Current Attention Level: Sustained   Following Commands: Follows one step commands inconsistently;Follows one step commands with increased time Safety/Judgement: Decreased awareness of safety;Decreased awareness of deficits Awareness: Intellectual Problem Solving: Slow processing;Decreased initiation;Difficulty sequencing;Requires verbal cues;Requires tactile cues General Comments: Pt with difficulty followinc commands, a bit resistive shaking head at times. Pt pushing away RNs and therapists when rolled to sidelying to clean pt due to pt laying in urine saturated sheets. Pt with poor awareness into her deficits and safety, needing repeated reminders not to try to pull off her mittens.      Exercises      General Comments  Pertinent Vitals/Pain Pain Assessment: Faces Faces Pain Scale: Hurts even more Pain Location: abdomen, inner thighs at site of wound noted Pain Descriptors / Indicators: Discomfort;Grimacing;Guarding (Shaking her head no at times, agitated) Pain Intervention(s): Limited activity within patient's tolerance;Monitored during session;Repositioned    Home Living                      Prior Function             PT Goals (current goals can now be found in the care plan section) Acute Rehab PT Goals Patient Stated Goal: to reduce pain PT Goal Formulation: Patient unable to participate in goal setting Time For Goal Achievement: 12/20/20 Potential to Achieve Goals: Fair Progress towards PT goals: Progressing toward goals (slowly)    Frequency    Min 3X/week      PT Plan Frequency needs to be updated (due to anoxic brain injury)    Co-evaluation              AM-PAC PT "6 Clicks" Mobility   Outcome Measure  Help needed turning from your back to your side while in a flat bed without using bedrails?: Total Help needed moving from lying on your back to sitting on the side of a flat bed without using bedrails?: Total Help needed moving to and from a bed to a chair (including a wheelchair)?: Total Help needed standing up from a chair using your arms (e.g., wheelchair or bedside chair)?: Total Help needed to walk in hospital room?: Total Help needed climbing 3-5 steps with a railing? : Total 6 Click Score: 6    End of Session Equipment Utilized During Treatment: Other (comment) (lift) Activity Tolerance: Patient limited by pain;Treatment limited secondary to agitation Patient left: in chair;with call bell/phone within reach;with chair alarm set (with pad under buttocks to reduce pressure) Nurse Communication: Mobility status;Need for lift equipment PT Visit Diagnosis: Other abnormalities of gait and mobility (R26.89);Other symptoms and signs involving the nervous system (R29.898);Muscle weakness (generalized) (M62.81);Difficulty in walking, not elsewhere classified (R26.2)     Time: 1856-3149 PT Time Calculation (min) (ACUTE ONLY): 32 min  Charges:  $Therapeutic Activity: 23-37 mins                     Moishe Spice, PT, DPT Acute Rehabilitation Services  Pager: [phone] Office: [place] 12/15/2020, 2:33 PM

## 2020-12-15 NOTE — Plan of Care (Signed)
  Problem: Clinical Measurements: Goal: Ability to maintain clinical measurements within normal limits will improve Outcome: Progressing   Problem: Clinical Measurements: Goal: Diagnostic test results will improve Outcome: Progressing   Problem: Clinical Measurements: Goal: Respiratory complications will improve Outcome: Progressing   Problem: Clinical Measurements: Goal: Cardiovascular complication will be avoided Outcome: Progressing   Problem: Coping: Goal: Level of anxiety will decrease Outcome: Progressing   Problem: Elimination: Goal: Will not experience complications related to bowel motility Outcome: Progressing   Problem: Elimination: Goal: Will not experience complications related to urinary retention Outcome: Progressing   Problem: Pain Managment: Goal: General experience of comfort will improve Outcome: Progressing   Problem: Safety: Goal: Ability to remain free from injury will improve Outcome: Progressing   Problem: Skin Integrity: Goal: Risk for impaired skin integrity will decrease Outcome: Progressing   Problem: Activity: Goal: Ability to tolerate increased activity will improve Outcome: Progressing   Problem: Role Relationship: Goal: Method of communication will improve Outcome: Progressing

## 2020-12-15 NOTE — TOC Progression Note (Addendum)
Transition of Care [facility]) - Progression Note    Patient Details  Name: Courtney Davies MRN: [id number] Date of Birth: 1976-01-16  Transition of Care [facility]) CM/SW [place], [place] Phone Number: 12/15/2020, 2:22 PM  Clinical Narrative:     Update- CSW received callback from Bevely Palmer with [facility]. CSW made referral for palliative services to follow patient at SNF.  CSW spoke with [name] patients sister who chose SNF placement at [facility] for patient. Patients sister gave CSW permission to make referral to [facility] for palliative services to follow patient at SNF. CSW left voicemail with [facility]. CSW awaiting callback. CSW called [name] with [facility] who confirmed they can accept patient for SNF placement. [name] confirmed she will start insurance authorization for patient.  CSW will continue to follow and assist with dc planning needs.  Expected Discharge Plan: [place] Barriers to Discharge: Continued Medical Work up  Expected Discharge Plan and Services Expected Discharge Plan: [place] arrangements for the past 2 months: Single Family Home                                       Social Determinants of Health (SDOH) Interventions    Readmission Risk Interventions No flowsheet data found.

## 2020-12-15 NOTE — Progress Notes (Signed)
PROGRESS NOTE  Courtney Davies YTK:[id number] DOB: 10-20-1975 DOA: 12/01/2020 PCP: Pcp, No   LOS: 14 days   Brief narrative:  45 years old female with past medical history of hypertension, vitamin B12 deficiency, GERD, obstructive sleep apnea, presented to the hospital on 12/01/2020 with ventricular fibrillation for around 10 minutes.  Patient was initially admitted to the ICU for V. fib arrest on 12/01/20 and ECG showed no ischemic changes.  On 12/06/2020, patient was successfully extubated to nasal cannula.  Patient has history of COVID positive.  CTA was negative for pulmonary embolism.  Patient was subsequently considered stable for transfer out of the ICU.  During hospitalization, patient was Continued on cortrak tube feeding.  She was thought to have a prolonged neurological recovery so plan for PEG tube has been discussed with the family.  Assessment/Plan:  Ventricular fibrillation arrest with acute respiratory failure. Anoxic encephalopathy. -Likely need prolonged rehab process -Seen and followed by cardiology this admission, not appropriate for ischemic work-up at this time, to be considered following neurological improvement  -Continue amiodarone,Coreg. -Electrolytes within normal range -Discharge planning  Dysphagia -Following anoxic brain injury -Status post palliative care meeting, decision made for PEG tube placement,  -NG tube removed, started tube feeds via PEG tube  Acute systolic heart failure.  Currently compensated.  2D echocardiogram showed reduced LV function with LV ejection fraction of 40 to 45% with global hypokinesis..   -Continue coreg and losartan   Atrial fibrillation On Lovenox subcu anticoagulation. Continue amiodarone, Coreg.  Ultrasound of the thyroid shows some nodules..  DM -Trulicity on hold, hemoglobin A1c was 6.9,  -CBGs remain elevated, add glargine 10 units twice daily  Aspiration pneumonitis. Resolved   Debility, deconditioning.   PT, OT  on board.  Recommending skilled nursing facility placement.  Nutrition.  tube feeds via PEG tube   DVT prophylaxis: enoxaparin (LOVENOX) injection 40 mg Start: 12/14/20 1000 SCDs Start: 12/01/20 1303, Lovenox subcu   Code Status: Full code Family Communication: No family at bedside, updated mother yesterday  Status is: Inpatient  Remains inpatient appropriate because:IV treatments appropriate due to intensity of illness or inability to take PO and Inpatient level of care appropriate due to severity of illness  Dispo: The patient is from: Home              Anticipated d/c is to: Skilled nursing facility placement              Patient currently is not medically stable to d/c.   Difficult to place patient Yes  Consultants: PCCM  Procedures: Intubation and mechanical ventilation.   Extubation Cortrak tube tube placement PEG tube placed in interventional radiology 8/30  Anti-infectives:  None at this time  Subjective: -No events overnight, continues to have mittens on  Objective: Vitals:   12/15/20 0800 12/15/20 1225  BP: 118/86 116/81  Pulse: 79 75  Resp:    Temp: 98.4 F (36.9 C) 98.3 F (36.8 C)  SpO2: 100% 96%    Intake/Output Summary (Last 24 hours) at 12/15/2020 1359 Last data filed at 12/15/2020 1000 Gross per 24 hour  Intake 870 ml  Output 680 ml  Net 190 ml   Filed Weights   12/13/20 0429 12/14/20 0500 12/15/20 0403  Weight: 100 kg 100.3 kg 102 kg   Body mass index is 36.29 kg/m.   Physical Exam:  General: Obese pleasant female eyes open, awake alert, mumbles few words, tracks HEENT: No JVD CVS: S1-S2, regular rate rhythm Lungs: Decreased breath sounds  bilaterally Abdomen: Obese, soft, nontender, PEG tube noted Extremities: No edema  Neuro: Awake alert, mumbles, intermittently follows commands, able to say a few words  skin: Warm and dry   Data Review: I have personally reviewed the following laboratory data and studies,  CBC: Recent Labs   Lab 01/01/2021 0316 12/12/20 0743 12/13/20 0458 12/15/20 0604  WBC 14.4* 13.1* 13.6* 11.5*  HGB 12.6 12.9 13.4 12.9  HCT 39.5 41.0 42.5 40.7  MCV 70.4* 72.1* 71.1* 71.4*  PLT 327 324 352 892   Basic Metabolic Panel: Recent Labs  Lab Jan 01, 2021 0316 12/11/20 0551 12/12/20 0743 12/13/20 0458 12/14/20 0239 12/15/20 0604  NA 134* 133* 134* 134* 132* 130*  K 4.3 4.6 4.6 5.0 4.8 4.6  CL 102 104 105 102 102 101  CO2 22 21* 22 20* 21* 20*  GLUCOSE 166* 196* 214* 162* 254* 201*  BUN 18 18 20  26* 27* 25*  CREATININE 0.78 0.87 0.89 0.97 1.01* 0.92  CALCIUM 9.1 9.1 9.1 9.6 9.2 9.2  MG 1.9 1.8 1.8 2.1 2.0  --    Liver Function Tests: No results for input(s): AST, ALT, ALKPHOS, BILITOT, PROT, ALBUMIN in the last 168 hours.  No results for input(s): LIPASE, AMYLASE in the last 168 hours. No results for input(s): AMMONIA in the last 168 hours. Cardiac Enzymes: No results for input(s): CKTOTAL, CKMB, CKMBINDEX, TROPONINI in the last 168 hours. BNP (last 3 results) Recent Labs    12/01/20 0913  BNP 37.2    ProBNP (last 3 results) No results for input(s): PROBNP in the last 8760 hours.  CBG: Recent Labs  Lab 12/14/20 1540 12/14/20 2111 12/15/20 0015 12/15/20 0353 12/15/20 0732  GLUCAP 120* 212* 229* 193* 221*   No results found for this or any previous visit (from the past 240 hour(s)).     Studies: IR GASTROSTOMY TUBE MOD SED  Result Date: 12/14/2020 INDICATION: Anoxic encephalopathy Dysphagia EXAM: Fluoroscopic guided gastrostomy tube placement MEDICATIONS: Ancef 2 g IV; Antibiotics were administered within 1 hour of the procedure. Glucagon 1 mg IV ANESTHESIA/SEDATION: Versed 3 mg IV; Fentanyl 125 mcg IV Moderate Sedation Time:  32 minutes The patient was continuously monitored during the procedure by the interventional radiology nurse under my direct supervision. CONTRAST:  15 mL of Omnipaque 240-administered into the gastric lumen. FLUOROSCOPY TIME:  Fluoroscopy Time: 2  minutes 30 seconds (21 mGy). COMPLICATIONS: None immediate. PROCEDURE: Informed written consent was obtained from the patient after a thorough discussion of the procedural risks, benefits and alternatives. All questions were addressed. Maximal Sterile Barrier Technique was utilized including caps, mask, sterile gowns, sterile gloves, sterile drape, hand hygiene and skin antiseptic. A timeout was performed prior to the initiation of the procedure. An orogastric tube was placed with fluoroscopic guidance. The anterior abdomen was prepped and draped in sterile fashion. Ultrasound evaluation of the left upper quadrant was performed to confirm the position of the liver. The skin and subcutaneous tissues were anesthetized with 1% lidocaine. Single gastropexy was placed. 17 gauge needle was directed into the distended stomach with fluoroscopic guidance. A wire was advanced into the stomach. 9-French vascular sheath was placed and the orogastric tube was snared using a Gooseneck snare device. The orogastric tube and snare were pulled out of the patient's mouth. The snare device was connected to a 20-French gastrostomy tube. The snare device and gastrostomy tube were pulled through the patient's mouth and out the anterior abdominal wall. The gastrostomy tube was cut to an appropriate length. Contrast injection  through gastrostomy tube confirmed placement within the stomach. The gastropexy was removed. Fluoroscopic images were obtained for documentation. The gastrostomy tube was flushed with normal saline. IMPRESSION: 32 French pull-through gastrostomy tube placed. Electronically Signed   By: Miachel Roux M.D.   On: 12/14/2020 08:33      Domenic Polite, MD  [facility] 12/15/2020  If 7PM-7AM, please contact night-coverage

## 2020-12-15 NOTE — Progress Notes (Signed)
Courtney Davies [phone] [facility]) Hospital Liaison note:  Notified by [name], LCSWA of request for [name] Palliative Care services. Will continue to follow for disposition.  Please call with any outpatient palliative questions or concerns.  Thank you for the opportunity to participate in this patient's care.  Thank you, Lorelee Market, LPN [facility] Liaison [phone]

## 2020-12-16 DIAGNOSIS — I469 Cardiac arrest, cause unspecified: Secondary | ICD-10-CM | POA: Diagnosis not present

## 2020-12-16 LAB — CBC
HCT: 40.8 % (ref 36.0–46.0)
Hemoglobin: 12.7 g/dL (ref 12.0–15.0)
MCH: 22.2 pg — ABNORMAL LOW (ref 26.0–34.0)
MCHC: 31.1 g/dL (ref 30.0–36.0)
MCV: 71.3 fL — ABNORMAL LOW (ref 80.0–100.0)
Platelets: 333 10*3/uL (ref 150–400)
RBC: 5.72 MIL/uL — ABNORMAL HIGH (ref 3.87–5.11)
RDW: 15.9 % — ABNORMAL HIGH (ref 11.5–15.5)
WBC: 12.8 10*3/uL — ABNORMAL HIGH (ref 4.0–10.5)
nRBC: 0 % (ref 0.0–0.2)

## 2020-12-16 LAB — GLUCOSE, CAPILLARY
Glucose-Capillary: 171 mg/dL — ABNORMAL HIGH (ref 70–99)
Glucose-Capillary: 190 mg/dL — ABNORMAL HIGH (ref 70–99)
Glucose-Capillary: 202 mg/dL — ABNORMAL HIGH (ref 70–99)
Glucose-Capillary: 218 mg/dL — ABNORMAL HIGH (ref 70–99)
Glucose-Capillary: 219 mg/dL — ABNORMAL HIGH (ref 70–99)
Glucose-Capillary: 275 mg/dL — ABNORMAL HIGH (ref 70–99)

## 2020-12-16 LAB — BASIC METABOLIC PANEL
Anion gap: 8 (ref 5–15)
BUN: 24 mg/dL — ABNORMAL HIGH (ref 6–20)
CO2: 21 mmol/L — ABNORMAL LOW (ref 22–32)
Calcium: 9.1 mg/dL (ref 8.9–10.3)
Chloride: 101 mmol/L (ref 98–111)
Creatinine, Ser: 0.87 mg/dL (ref 0.44–1.00)
GFR, Estimated: >60 mL/min (ref >60)
Glucose, Bld: 165 mg/dL — ABNORMAL HIGH (ref 70–99)
Potassium: 4 mmol/L (ref 3.5–5.1)
Sodium: 130 mmol/L — ABNORMAL LOW (ref 135–145)

## 2020-12-16 MED ORDER — MELATONIN 3 MG PO TABS
3.0000 mg | ORAL_TABLET | Freq: Once | ORAL | Status: AC | PRN
  Administered 2020-12-16: 3 mg via ORAL
  Filled 2020-12-16: qty 1

## 2020-12-16 MED ORDER — MORPHINE SULFATE (PF) 2 MG/ML IV SOLN
1.0000 mg | INTRAVENOUS | Status: DC | PRN
Start: 2020-12-16 — End: 2020-12-17
  Administered 2020-12-16 – 2020-12-17 (×3): 1 mg via INTRAVENOUS
  Filled 2020-12-16 (×3): qty 1

## 2020-12-16 MED ORDER — INSULIN GLARGINE-YFGN 100 UNIT/ML ~~LOC~~ SOLN
15.0000 [IU] | Freq: Two times a day (BID) | SUBCUTANEOUS | Status: DC
  Administered 2020-12-16 – 2020-12-17 (×2): 15 [IU] via SUBCUTANEOUS
  Filled 2020-12-16 (×4): qty 0.15

## 2020-12-16 MED ORDER — PANTOPRAZOLE SODIUM 40 MG PO PACK
40.0000 mg | PACK | Freq: Every day | ORAL | Status: DC
  Administered 2020-12-16 – 2020-12-21 (×6): 40 mg
  Filled 2020-12-16 (×10): qty 20

## 2020-12-16 MED ORDER — SIMETHICONE 40 MG/0.6ML PO SUSP
40.0000 mg | Freq: Four times a day (QID) | ORAL | Status: DC | PRN
  Administered 2020-12-16: 40 mg
  Filled 2020-12-16 (×2): qty 0.6

## 2020-12-16 NOTE — Plan of Care (Signed)
  Problem: Clinical Measurements: Goal: Ability to maintain clinical measurements within normal limits will improve Outcome: Progressing   Problem: Clinical Measurements: Goal: Diagnostic test results will improve Outcome: Progressing   Problem: Clinical Measurements: Goal: Cardiovascular complication will be avoided Outcome: Progressing   Problem: Nutrition: Goal: Adequate nutrition will be maintained Outcome: Progressing   Problem: Coping: Goal: Level of anxiety will decrease Outcome: Progressing   Problem: Elimination: Goal: Will not experience complications related to bowel motility Outcome: Progressing   Problem: Pain Managment: Goal: General experience of comfort will improve Outcome: Progressing   Problem: Safety: Goal: Ability to remain free from injury will improve Outcome: Progressing   Problem: Activity: Goal: Ability to tolerate increased activity will improve Outcome: Progressing

## 2020-12-16 NOTE — Progress Notes (Signed)
Physical Therapy Treatment Patient Details Name: Courtney Davies MRN: [id number] DOB: 1975-10-09 Today's Date: 12/16/2020    History of Present Illness Pt is a 45 y/o female admitted 12/01/20 with Vfib arrest with ROSC; pt also (+) COVID. CTA negative for PE. MRI 8/21 consistent with hypoxic/ischemic injury. 8/21 RLL infiltrate seen on CXR from aspiration pna. Intubated 8/18-23 (failed initial attempt to extubate 8/19). S/p PEG placement 8/30. PMH includes B12 deficiency, DM, OA, bronchitis, dysphagia.   PT Comments    Pt limited by clear discomfort with mobility this session; pt unable to localize pain this session (per SLP, told her it was abdomen/leg). Pt requiring maxA+2-3 to [place] for bed mobility and completing ADLs secondary to urine incontinence. Deferred lift transfer OOB secondary to discomfort and agitation. Pt remains limited by generalized weakness with prolonged time in bed, as well as cognitive impairment. Continue to recommend SNF-level therapies to maximize functional mobility and decrease caregiver burden.    Follow Up Recommendations  SNF;Supervision/Assistance - 24 hour     Equipment Recommendations  Wheelchair (measurements PT);Wheelchair cushion (measurements PT);Hospital bed;Other (comment) (mechanical lift)    Recommendations for Other Services       Precautions / Restrictions Precautions Precautions: Fall Precaution Comments: flexiseal, G tube, abdominal binder Restrictions Weight Bearing Restrictions: No    Mobility  Bed Mobility Overal bed mobility: Needs Assistance Bed Mobility: Rolling Rolling: Total assist;+2 for physical assistance;+2 for safety/equipment         General bed mobility comments: MaxA+2 to roll R/L for pericare and linen change; max tactile/verbal cues to hold bed rail but pt consistently letting go; +3 assist needed to perform pericare. Deferred hoyer lift transfer secondary to pt's moaning/crying and apparent discomfort. TotalA for  scooting up in bed, bed placed in modified chair position    Transfers                    Ambulation/Gait                 Stairs             Wheelchair Mobility    Modified Rankin (Stroke Patients Only)       Balance                                            Cognition Arousal/Alertness: Awake/alert Behavior During Therapy: Agitated;Anxious Overall Cognitive Status: Difficult to assess                                 General Comments: Pt had been communicating some with family upon entering room, but minimal verbalizations to therapist; does state, "I want to get up." But then appears overwhelmed by people/situation(?), crying and moaning (related to pain?). Not following simple commands; difficult to redirect      Exercises      General Comments General comments (skin integrity, edema, etc.): Pt's aunt and cousin present; provided some education on brain injury and tips for communicating with pt, as well as allow pt plenty of time to rest and not overstimulate. RN and NT present to assist during session      Pertinent Vitals/Pain Pain Assessment: Faces Faces Pain Scale: Hurts even more Pain Location: would not specify Pain Descriptors / Indicators: Discomfort;Grimacing;Guarding;Moaning Pain Intervention(s): Monitored during session;Limited activity within patient's tolerance;Repositioned  Home Living                      Prior Function            PT Goals (current goals can now be found in the care plan section) Progress towards PT goals: Not progressing toward goals - comment (limited by cognition/pain this session)    Frequency    Min 3X/week      PT Plan Current plan remains appropriate    Co-evaluation              AM-PAC PT "6 Clicks" Mobility   Outcome Measure  Help needed turning from your back to your side while in a flat bed without using bedrails?: Total Help needed  moving from lying on your back to sitting on the side of a flat bed without using bedrails?: Total Help needed moving to and from a bed to a chair (including a wheelchair)?: Total Help needed standing up from a chair using your arms (e.g., wheelchair or bedside chair)?: Total Help needed to walk in hospital room?: Total Help needed climbing 3-5 steps with a railing? : Total 6 Click Score: 6    End of Session   Activity Tolerance: Patient limited by pain;Treatment limited secondary to agitation Patient left: in bed;with call bell/phone within reach;with bed alarm set;with nursing/sitter in room Nurse Communication: Mobility status;Need for lift equipment PT Visit Diagnosis: Other abnormalities of gait and mobility (R26.89);Other symptoms and signs involving the nervous system (R29.898);Muscle weakness (generalized) (M62.81);Difficulty in walking, not elsewhere classified (R26.2)     Time: 1150-1209 PT Time Calculation (min) (ACUTE ONLY): 19 min  Charges:  $Therapeutic Activity: 8-22 mins                     Mabeline Caras, PT, DPT Acute Rehabilitation Services  Pager [phone] Office [phone]  Derry Lory 12/16/2020, 1:07 PM

## 2020-12-16 NOTE — Progress Notes (Signed)
PROGRESS NOTE  Courtney Davies ZDG:[id number] DOB: 20-Jun-1975 DOA: 12/01/2020 PCP: Pcp, No   LOS: 15 days   Brief narrative:  45 years old female with past medical history of hypertension, vitamin B12 deficiency, GERD, obstructive sleep apnea, presented to the hospital on 12/01/2020 with ventricular fibrillation for around 10 minutes.  Patient was initially admitted to the ICU for V. fib arrest on 12/01/20 and ECG showed no ischemic changes.  On 12/06/2020, patient was successfully extubated to nasal cannula.  Patient has history of COVID positive.  CTA was negative for pulmonary embolism.  Patient was subsequently considered stable for transfer out of the ICU.  During hospitalization, patient was Continued on cortrak tube feeding.  She was thought to have a prolonged neurological recovery so plan for PEG tube has been discussed with the family.  Assessment/Plan:  Ventricular fibrillation arrest with acute respiratory failure. Anoxic encephalopathy. -Likely need prolonged rehab process -Seen and followed by cardiology this admission, not appropriate for ischemic work-up at this time, to be considered following neurological improvement  -Continue amiodarone,Coreg. -Electrolytes within normal range -Crying and rubbing abdomen, unable to assess clearly, needs abdominal exam is unremarkable, tolerating tube feeds, monitor clinically, labs in a.m. -Remove mittens, rectal tube  Dysphagia -Following anoxic brain injury -Status post palliative care meeting, decision made for PEG tube placement,  -NG tube removed, started tube feeds via PEG tube  Acute systolic heart failure.  Currently compensated.  2D echocardiogram showed reduced LV function with LV ejection fraction of 40 to 45% with global hypokinesis..   -Continue coreg and losartan   Transient atrial fibrillation -Noted to have transient A. fib and SVT this admission -Now stable on amiodarone and Coreg, given transient episode cardiology  recommended against long-term anticoagulation  Thyroid nodules -Noted on ultrasound needs follow-up for this -TSH and free T4 were within normal limits  DM -Trulicity on hold, hemoglobin A1c was 6.9,  -CBGs remain elevated, started on glargine, will increase dose  Aspiration pneumonitis. Resolved   Debility, deconditioning.   PT, OT on board.  Recommending skilled nursing facility placement.  Nutrition.  tube feeds via PEG tube   DVT prophylaxis: enoxaparin (LOVENOX) injection 40 mg Start: 12/14/20 1000 SCDs Start: 12/01/20 1303, Lovenox subcu   Code Status: Full code Family Communication: No family at bedside, updated mother yesterday  Status is: Inpatient  Remains inpatient appropriate because:IV treatments appropriate due to intensity of illness or inability to take PO and Inpatient level of care appropriate due to severity of illness  Dispo: The patient is from: Home              Anticipated d/c is to: Skilled nursing facility placement              Patient currently is not medically stable to d/c.   Difficult to place patient Yes  Consultants: PCCM  Procedures: Intubation and mechanical ventilation.   Extubation Cortrak tube tube placement PEG tube placed in interventional radiology 8/30  Anti-infectives:  None at this time  Subjective: -Crying, tearful, no other events overnight  Objective: Vitals:   12/16/20 0029 12/16/20 0319  BP: 117/79 129/77  Pulse:  85  Resp: 19 19  Temp: 98.8 F (37.1 C) 99.4 F (37.4 C)  SpO2:  98%    Intake/Output Summary (Last 24 hours) at 12/16/2020 1411 Last data filed at 12/16/2020 0400 Gross per 24 hour  Intake 680 ml  Output --  Net 680 ml   Filed Weights   12/14/20 0500 12/15/20  0403 12/16/20 0321  Weight: 100.3 kg 102 kg 102.5 kg   Body mass index is 36.47 kg/m.   Physical Exam:  General: Obese female, eyes open, awake alert, mumbles a few words, tracks, does not consistently follow commands,  crying HEENT: No JVD CVS: S1-S2, regular rate rhythm Lungs: Decreased breath sounds to bases Abdomen: Obese, soft, nontender, PEG tube noted Extremities: No edema Neuro: Awake alert, mumbles, intermittently follows commands, able to say a few words  skin: No rashes on exposed skin   Data Review: I have personally reviewed the following laboratory data and studies,  CBC: Recent Labs  Lab 12/10/20 0316 12/12/20 0743 12/13/20 0458 12/15/20 0604 12/16/20 0208  WBC 14.4* 13.1* 13.6* 11.5* 12.8*  HGB 12.6 12.9 13.4 12.9 12.7  HCT 39.5 41.0 42.5 40.7 40.8  MCV 70.4* 72.1* 71.1* 71.4* 71.3*  PLT 327 324 352 343 638   Basic Metabolic Panel: Recent Labs  Lab 12/10/20 0316 12/11/20 0551 12/12/20 0743 12/13/20 0458 12/14/20 0239 12/15/20 0604 12/16/20 0208  NA 134* 133* 134* 134* 132* 130* 130*  K 4.3 4.6 4.6 5.0 4.8 4.6 4.0  CL 102 104 105 102 102 101 101  CO2 22 21* 22 20* 21* 20* 21*  GLUCOSE 166* 196* 214* 162* 254* 201* 165*  BUN 18 18 20  26* 27* 25* 24*  CREATININE 0.78 0.87 0.89 0.97 1.01* 0.92 0.87  CALCIUM 9.1 9.1 9.1 9.6 9.2 9.2 9.1  MG 1.9 1.8 1.8 2.1 2.0  --   --    Liver Function Tests: No results for input(s): AST, ALT, ALKPHOS, BILITOT, PROT, ALBUMIN in the last 168 hours.  No results for input(s): LIPASE, AMYLASE in the last 168 hours. No results for input(s): AMMONIA in the last 168 hours. Cardiac Enzymes: No results for input(s): CKTOTAL, CKMB, CKMBINDEX, TROPONINI in the last 168 hours. BNP (last 3 results) Recent Labs    12/01/20 0913  BNP 37.2    ProBNP (last 3 results) No results for input(s): PROBNP in the last 8760 hours.  CBG: Recent Labs  Lab 12/15/20 2031 12/16/20 0029 12/16/20 0316 12/16/20 0734 12/16/20 1155  GLUCAP 187* 202* 171* 218* 275*   Recent Results (from the past 240 hour(s))  SARS CORONAVIRUS 2 (TAT 6-24 HRS) Nasopharyngeal Nasopharyngeal Swab     Status: Abnormal   Collection Time: 12/15/20  2:05 PM   Specimen:  Nasopharyngeal Swab  Result Value Ref Range Status   SARS Coronavirus 2 POSITIVE (A) NEGATIVE Final    Comment: (NOTE) SARS-CoV-2 target nucleic acids are DETECTED.  The SARS-CoV-2 RNA is generally detectable in upper and lower respiratory specimens during the acute phase of infection. Positive results are indicative of the presence of SARS-CoV-2 RNA. Clinical correlation with patient history and other diagnostic information is  necessary to determine patient infection status. Positive results do not rule out bacterial infection or co-infection with other viruses.  The expected result is Negative.  Fact Sheet for Patients: SugarRoll.be  Fact Sheet for Healthcare Providers: https://www.woods-mathews.com/  This test is not yet approved or cleared by the [place] FDA and  has been authorized for detection and/or diagnosis of SARS-CoV-2 by FDA under an Emergency Use Authorization (EUA). This EUA will remain  in effect (meaning this test can be used) for the duration of the COVID-19 declaration under Section 564(b)(1) of the Act, 21 U. S.C. section 360bbb-3(b)(1), unless the authorization is terminated or revoked sooner.   Performed at [facility], [place] [address], [place], [place]  [address]        Studies: No results found.    Domenic Polite, MD  [facility] 12/16/2020  If 7PM-7AM, please contact night-coverage

## 2020-12-16 NOTE — Progress Notes (Signed)
Received report from previous RN that no need to reinsert IV line per physician.

## 2020-12-16 NOTE — Progress Notes (Signed)
  Speech Language Pathology Treatment: Dysphagia  Patient Details Name: Courtney Davies MRN: [id number] DOB: 1975/10/04 Today's Date: 12/16/2020 Time: 8616-8372 SLP Time Calculation (min) (ACUTE ONLY): 13 min  Assessment / Plan / Recommendation Clinical Impression  Pt much more alert and communicative than she has been in previous sessions with SLP . Today she is answering basic questions, unfortunately moaning in pain but was finally was able to localize to leg/stomach.  Accepted one tiny sip of water but rejected all subsequent attempts to feed due primarily to pain level.  RN notified, who contacted MD for approval for more pain meds.  Pt's aunt and cousin arrived during session. Aunt asked "Do you know me?" And Courtney Davies responded "Of course I do." Repeated efforts to offer POs, hoping their presence might prompt participation, but pt began to cry and declined.  It appears that pt may D/C to SNF in the next few days - will need SLP follow-up at SNF to assess swallowing.  Pt's swallowing issues have been cognitively based (doubt there is a mechanical component.)  There is a good likelihood that once she is willing to try some POs, she may be able to resume a normal diet.     HPI HPI: Pt is a 45 y/o female admitted 8/18 with Vfib arrest with ROSC, COVID. CTA negative for PE. ETT 8/18-23 (failed inital attempt to extubate 8/19). MRI 8/21 consistent with hypoxic/ischemic injury. CXR 8/21 RLL infiltrate. PMT 8/23: continued aggressive care; family very hopeful for continued improvement. PEG 8/30. PMH: B12 deficiency, DM, OA, bronchitis.      SLP Plan  Continue with current plan of care       Recommendations  Diet recommendations: NPO                Oral Care Recommendations: Oral care QID Follow up Recommendations: Skilled Nursing facility;24 hour supervision/assistance SLP Visit Diagnosis: Dysphagia, unspecified (R13.10) Plan: Continue with current plan of care       GO               Courtney Davies L. Courtney Davies, [place] CCC/SLP Acute Rehabilitation Services Office number [phone] Pager [phone]   Courtney Davies Courtney Davies 12/16/2020, 11:03 AM

## 2020-12-16 NOTE — TOC Progression Note (Addendum)
Transition of Care [facility]) - Progression Note    Patient Details  Name: Courtney Davies MRN: [id number] Date of Birth: [birth year]  Transition of Care [facility]) CM/SW [place], [place] Phone Number: 12/16/2020, 10:07 AM  Clinical Narrative:     Update-CSW received call from [place] with [place] who reports patients insurance is requesting additional clinicals to be faxed over. [name] request CSW send clinicals for patients insurance. CSW faxed over additional clinicals to [place] at [facility] for insurance determination.  CSW spoke with [name] with [place] who confirmed patients insurance authorization is still pending. CSW will continue to follow and assist with dc planning needs.  Expected Discharge Plan: [place] Barriers to Discharge: Continued Medical Work up  Expected Discharge Plan and Services Expected Discharge Plan: [place] arrangements for the past 2 months: Single Family Home                                       Social Determinants of Health (SDOH) Interventions    Readmission Risk Interventions No flowsheet data found.

## 2020-12-17 ENCOUNTER — Inpatient Hospital Stay (HOSPITAL_COMMUNITY): Payer: Medicaid Other

## 2020-12-17 DIAGNOSIS — I469 Cardiac arrest, cause unspecified: Secondary | ICD-10-CM | POA: Diagnosis not present

## 2020-12-17 LAB — URINALYSIS, ROUTINE W REFLEX MICROSCOPIC
Bilirubin Urine: NEGATIVE
Glucose, UA: 50 mg/dL — AB
Hgb urine dipstick: NEGATIVE
Ketones, ur: NEGATIVE mg/dL
Leukocytes,Ua: NEGATIVE
Nitrite: NEGATIVE
Protein, ur: 30 mg/dL — AB
Specific Gravity, Urine: 1.023 (ref 1.005–1.030)
pH: 5 (ref 5.0–8.0)

## 2020-12-17 LAB — COMPREHENSIVE METABOLIC PANEL
ALT: 32 U/L (ref 0–44)
AST: 27 U/L (ref 15–41)
Albumin: 3.2 g/dL — ABNORMAL LOW (ref 3.5–5.0)
Alkaline Phosphatase: 139 U/L — ABNORMAL HIGH (ref 38–126)
Anion gap: 9 (ref 5–15)
BUN: 27 mg/dL — ABNORMAL HIGH (ref 6–20)
CO2: 21 mmol/L — ABNORMAL LOW (ref 22–32)
Calcium: 9.1 mg/dL (ref 8.9–10.3)
Chloride: 101 mmol/L (ref 98–111)
Creatinine, Ser: 1.1 mg/dL — ABNORMAL HIGH (ref 0.44–1.00)
GFR, Estimated: >60 mL/min (ref >60)
Glucose, Bld: 227 mg/dL — ABNORMAL HIGH (ref 70–99)
Potassium: 4.7 mmol/L (ref 3.5–5.1)
Sodium: 131 mmol/L — ABNORMAL LOW (ref 135–145)
Total Bilirubin: 0.1 mg/dL — ABNORMAL LOW (ref 0.3–1.2)
Total Protein: 7.8 g/dL (ref 6.5–8.1)

## 2020-12-17 LAB — GLUCOSE, CAPILLARY
Glucose-Capillary: 168 mg/dL — ABNORMAL HIGH (ref 70–99)
Glucose-Capillary: 189 mg/dL — ABNORMAL HIGH (ref 70–99)
Glucose-Capillary: 239 mg/dL — ABNORMAL HIGH (ref 70–99)
Glucose-Capillary: 255 mg/dL — ABNORMAL HIGH (ref 70–99)
Glucose-Capillary: 283 mg/dL — ABNORMAL HIGH (ref 70–99)
Glucose-Capillary: 323 mg/dL — ABNORMAL HIGH (ref 70–99)

## 2020-12-17 LAB — CBC
HCT: 40.9 % (ref 36.0–46.0)
Hemoglobin: 12.8 g/dL (ref 12.0–15.0)
MCH: 22.5 pg — ABNORMAL LOW (ref 26.0–34.0)
MCHC: 31.3 g/dL (ref 30.0–36.0)
MCV: 72 fL — ABNORMAL LOW (ref 80.0–100.0)
Platelets: 375 10*3/uL (ref 150–400)
RBC: 5.68 MIL/uL — ABNORMAL HIGH (ref 3.87–5.11)
RDW: 15.9 % — ABNORMAL HIGH (ref 11.5–15.5)
WBC: 20.4 10*3/uL — ABNORMAL HIGH (ref 4.0–10.5)
nRBC: 0 % (ref 0.0–0.2)

## 2020-12-17 MED ORDER — SODIUM CHLORIDE 0.9 % IV SOLN: INTRAVENOUS | Status: AC

## 2020-12-17 MED ORDER — OXYCODONE HCL 20 MG/ML PO CONC
5.0000 mg | Freq: Four times a day (QID) | ORAL | Status: AC | PRN
  Administered 2020-12-18 – 2020-12-19 (×2): 5 mg
  Filled 2020-12-17 (×2): qty 1

## 2020-12-17 MED ORDER — INSULIN GLARGINE-YFGN 100 UNIT/ML ~~LOC~~ SOLN
20.0000 [IU] | Freq: Two times a day (BID) | SUBCUTANEOUS | Status: DC
  Administered 2020-12-17 – 2020-12-20 (×6): 20 [IU] via SUBCUTANEOUS
  Filled 2020-12-17 (×7): qty 0.2

## 2020-12-17 MED ORDER — MORPHINE SULFATE (PF) 2 MG/ML IV SOLN
2.0000 mg | INTRAVENOUS | Status: DC | PRN
  Administered 2020-12-18 – 2020-12-19 (×3): 2 mg via INTRAVENOUS
  Filled 2020-12-17 (×3): qty 1

## 2020-12-17 MED ORDER — IOHEXOL 350 MG/ML SOLN
80.0000 mL | Freq: Once | INTRAVENOUS | Status: AC | PRN
  Administered 2020-12-17: 80 mL via INTRAVENOUS

## 2020-12-17 NOTE — Progress Notes (Addendum)
PROGRESS NOTE  Courtney Davies SWF:[id number] DOB: 07/07/1975 DOA: 12/01/2020 PCP: Pcp, No   LOS: 16 days   Brief narrative:  45 years old female with past medical history of hypertension, vitamin B12 deficiency, GERD, obstructive sleep apnea, presented to the hospital on 12/01/2020 with ventricular fibrillation for around 10 minutes.  Patient was initially admitted to the ICU for V. fib arrest on 12/01/20 and ECG showed no ischemic changes.  On 12/06/2020, patient was successfully extubated to nasal cannula.  Patient has history of COVID positive.  CTA was negative for pulmonary embolism.  Patient was subsequently considered stable for transfer out of the ICU.  During hospitalization, patient was Continued on cortrak tube feeding.  She was thought to have a prolonged neurological recovery so plan for PEG tube has been discussed with the family.  Assessment/Plan:  Ventricular fibrillation arrest with acute respiratory failure. Anoxic encephalopathy. -Likely need prolonged rehab process -Seen and followed by cardiology this admission, not appropriate for ischemic work-up at this time, to be considered following neurological improvement  -Continue amiodarone,Coreg. -Electrolytes within normal range  Low-grade fever, leukocytosis -Etiology is unclear, UA appears unremarkable, will check CT abdomen pelvis, lung exam is benign -Add IV fluids today  Dysphagia -Following anoxic brain injury -Status post palliative care meeting, decision made for PEG tube placement,  -NG tube removed, started tube feeds via PEG tube  Acute systolic heart failure.  Currently compensated.  2D echocardiogram showed reduced LV function with LV ejection fraction of 40 to 45% with global hypokinesis..   -Continue coreg and losartan   Transient atrial fibrillation -Noted to have transient A. fib and SVT this admission -Now stable on amiodarone and Coreg, given transient episode cardiology recommended against  long-term anticoagulation  Thyroid nodules -Noted on ultrasound needs follow-up for this -TSH and free T4 were within normal limits  DM type II -Trulicity on hold, hemoglobin A1c was 6.9,  -CBGs remain elevated, started on glargine, will increase dose  Aspiration pneumonitis. Resolved   Debility, deconditioning.   PT, OT on board.  Recommending skilled nursing facility placement.  Nutrition.  tube feeds via PEG tube   DVT prophylaxis: Lovenox   Code Status: Full code Family Communication: No family at bedside, updated mother 3 days ago  Status is: Inpatient  Remains inpatient appropriate because:IV treatments appropriate due to intensity of illness or inability to take PO and Inpatient level of care appropriate due to severity of illness  Dispo: The patient is from: Home              Anticipated d/c is to: Skilled nursing facility placement              Patient currently is not medically stable to d/c.   Difficult to place patient Yes  Consultants: PCCM  Procedures: Intubation and mechanical ventilation.   Extubation Cortrak tube tube placement PEG tube placed in interventional radiology 8/30  Anti-infectives:  None at this time  Subjective: -Crying, tearful, no other events overnight  Objective: Vitals:   12/17/20 0325 12/17/20 1129  BP: 128/77 114/73  Pulse: (!) 104 99  Resp: 19 20  Temp: 99.5 F (37.5 C) 97.6 F (36.4 C)  SpO2: 99% 99%    Intake/Output Summary (Last 24 hours) at 12/17/2020 1145 Last data filed at 12/17/2020 0600 Gross per 24 hour  Intake 700 ml  Output --  Net 700 ml   Filed Weights   12/15/20 0403 12/16/20 0321 12/17/20 0325  Weight: 102 kg 102.5 kg 97.7  kg   Body mass index is 34.77 kg/m.   Physical Exam:  General: Obese female, eyes open, awake alert, mumbles a few words, tracks, does not consistently follow commands, crying HEENT: No JVD CVS: S1-S2, regular rate rhythm Lungs: Decreased breath sounds to bases Abdomen:  Obese, soft, nontender, PEG tube noted Extremities: No edema Neuro: Awake alert, mumbles, intermittently follows commands, able to say a few words  skin: No rashes on exposed skin   Data Review: I have personally reviewed the following laboratory data and studies,  CBC: Recent Labs  Lab 12/12/20 0743 12/13/20 0458 12/15/20 0604 12/16/20 0208 12/17/20 0124  WBC 13.1* 13.6* 11.5* 12.8* 20.4*  HGB 12.9 13.4 12.9 12.7 12.8  HCT 41.0 42.5 40.7 40.8 40.9  MCV 72.1* 71.1* 71.4* 71.3* 72.0*  PLT 324 352 343 333 798   Basic Metabolic Panel: Recent Labs  Lab 12/11/20 0551 12/12/20 0743 12/13/20 0458 12/14/20 0239 12/15/20 0604 12/16/20 0208 12/17/20 0124  NA 133* 134* 134* 132* 130* 130* 131*  K 4.6 4.6 5.0 4.8 4.6 4.0 4.7  CL 104 105 102 102 101 101 101  CO2 21* 22 20* 21* 20* 21* 21*  GLUCOSE 196* 214* 162* 254* 201* 165* 227*  BUN 18 20 26* 27* 25* 24* 27*  CREATININE 0.87 0.89 0.97 1.01* 0.92 0.87 1.10*  CALCIUM 9.1 9.1 9.6 9.2 9.2 9.1 9.1  MG 1.8 1.8 2.1 2.0  --   --   --    Liver Function Tests: Recent Labs  Lab 12/17/20 0124  AST 27  ALT 32  ALKPHOS 139*  BILITOT 0.1*  PROT 7.8  ALBUMIN 3.2*    No results for input(s): LIPASE, AMYLASE in the last 168 hours. No results for input(s): AMMONIA in the last 168 hours. Cardiac Enzymes: No results for input(s): CKTOTAL, CKMB, CKMBINDEX, TROPONINI in the last 168 hours. BNP (last 3 results) Recent Labs    12/01/20 0913  BNP 37.2    ProBNP (last 3 results) No results for input(s): PROBNP in the last 8760 hours.  CBG: Recent Labs  Lab 12/16/20 2043 12/17/20 0000 12/17/20 0328 12/17/20 0720 12/17/20 1112  GLUCAP 190* 239* 189* 283* 323*   Recent Results (from the past 240 hour(s))  SARS CORONAVIRUS 2 (TAT 6-24 HRS) Nasopharyngeal Nasopharyngeal Swab     Status: Abnormal   Collection Time: 12/15/20  2:05 PM   Specimen: Nasopharyngeal Swab  Result Value Ref Range Status   SARS Coronavirus 2 POSITIVE  (A) NEGATIVE Final    Comment: (NOTE) SARS-CoV-2 target nucleic acids are DETECTED.  The SARS-CoV-2 RNA is generally detectable in upper and lower respiratory specimens during the acute phase of infection. Positive results are indicative of the presence of SARS-CoV-2 RNA. Clinical correlation with patient history and other diagnostic information is  necessary to determine patient infection status. Positive results do not rule out bacterial infection or co-infection with other viruses.  The expected result is Negative.  Fact Sheet for Patients: SugarRoll.be  Fact Sheet for Healthcare Providers: https://www.woods-mathews.com/  This test is not yet approved or cleared by the [place] FDA and  has been authorized for detection and/or diagnosis of SARS-CoV-2 by FDA under an Emergency Use Authorization (EUA). This EUA will remain  in effect (meaning this test can be used) for the duration of the COVID-19 declaration under Section 564(b)(1) of the Act, 21 U. S.C. section 360bbb-3(b)(1), unless the authorization is terminated or revoked sooner.   Performed at [facility], [place]  [address], [place], [place] [address]        Studies: No results found.    Domenic Polite, MD  [facility] 12/17/2020  If 7PM-7AM, please contact night-coverage

## 2020-12-17 NOTE — TOC Progression Note (Signed)
Transition of Care [facility]) - Progression Note    Patient Details  Name: Courtney Davies MRN: [id number] Date of Birth: 1975/12/22  Transition of Care [facility]) CM/SW [place], [place] Phone Number: [phone] 12/17/2020, 4:16 PM  Clinical Narrative:     Checked with [place] about insurance auth,awaiting to hear back.  TOC team will continue to assist with discharge planning needs.    Expected Discharge Plan: [place] Barriers to Discharge: Continued Medical Work up  Expected Discharge Plan and Services Expected Discharge Plan: [place] arrangements for the past 2 months: Single Family Home                                       Social Determinants of Health (SDOH) Interventions    Readmission Risk Interventions No flowsheet data found.

## 2020-12-18 ENCOUNTER — Inpatient Hospital Stay (HOSPITAL_COMMUNITY): Payer: Medicaid Other

## 2020-12-18 DIAGNOSIS — Z978 Presence of other specified devices: Secondary | ICD-10-CM | POA: Diagnosis not present

## 2020-12-18 DIAGNOSIS — I469 Cardiac arrest, cause unspecified: Secondary | ICD-10-CM | POA: Diagnosis not present

## 2020-12-18 DIAGNOSIS — L03311 Cellulitis of abdominal wall: Secondary | ICD-10-CM | POA: Diagnosis not present

## 2020-12-18 LAB — GLUCOSE, CAPILLARY
Glucose-Capillary: 118 mg/dL — ABNORMAL HIGH (ref 70–99)
Glucose-Capillary: 178 mg/dL — ABNORMAL HIGH (ref 70–99)
Glucose-Capillary: 237 mg/dL — ABNORMAL HIGH (ref 70–99)
Glucose-Capillary: 255 mg/dL — ABNORMAL HIGH (ref 70–99)
Glucose-Capillary: 262 mg/dL — ABNORMAL HIGH (ref 70–99)
Glucose-Capillary: 270 mg/dL — ABNORMAL HIGH (ref 70–99)
Glucose-Capillary: 300 mg/dL — ABNORMAL HIGH (ref 70–99)

## 2020-12-18 LAB — URINE CULTURE: Culture: NO GROWTH

## 2020-12-18 LAB — CBC
HCT: 34.8 % — ABNORMAL LOW (ref 36.0–46.0)
Hemoglobin: 11.1 g/dL — ABNORMAL LOW (ref 12.0–15.0)
MCH: 22.8 pg — ABNORMAL LOW (ref 26.0–34.0)
MCHC: 31.9 g/dL (ref 30.0–36.0)
MCV: 71.6 fL — ABNORMAL LOW (ref 80.0–100.0)
Platelets: 314 10*3/uL (ref 150–400)
RBC: 4.86 MIL/uL (ref 3.87–5.11)
RDW: 15.7 % — ABNORMAL HIGH (ref 11.5–15.5)
WBC: 25.7 10*3/uL — ABNORMAL HIGH (ref 4.0–10.5)
nRBC: 0 % (ref 0.0–0.2)

## 2020-12-18 LAB — BASIC METABOLIC PANEL
Anion gap: 11 (ref 5–15)
BUN: 34 mg/dL — ABNORMAL HIGH (ref 6–20)
CO2: 18 mmol/L — ABNORMAL LOW (ref 22–32)
Calcium: 8.7 mg/dL — ABNORMAL LOW (ref 8.9–10.3)
Chloride: 100 mmol/L (ref 98–111)
Creatinine, Ser: 1.2 mg/dL — ABNORMAL HIGH (ref 0.44–1.00)
GFR, Estimated: 57 mL/min — ABNORMAL LOW (ref >60)
Glucose, Bld: 251 mg/dL — ABNORMAL HIGH (ref 70–99)
Potassium: 4.7 mmol/L (ref 3.5–5.1)
Sodium: 129 mmol/L — ABNORMAL LOW (ref 135–145)

## 2020-12-18 LAB — PROCALCITONIN
Procalcitonin: 3.77 ng/mL
Procalcitonin: 0.27 ng/mL

## 2020-12-18 LAB — C-REACTIVE PROTEIN: CRP: 27.3 mg/dL — ABNORMAL HIGH (ref <1.0)

## 2020-12-18 MED ORDER — IBUPROFEN 100 MG/5ML PO SUSP
600.0000 mg | Freq: Four times a day (QID) | ORAL | Status: DC | PRN
Start: 2020-12-18 — End: 2020-12-21
  Administered 2020-12-18: 600 mg
  Filled 2020-12-18 (×2): qty 30

## 2020-12-18 MED ORDER — SODIUM CHLORIDE 0.9 % IV SOLN
2.0000 g | INTRAVENOUS | Status: AC
Start: 2020-12-18 — End: 2020-12-20
  Administered 2020-12-18 – 2020-12-19 (×2): 2 g via INTRAVENOUS
  Filled 2020-12-18 (×3): qty 20

## 2020-12-18 MED ORDER — SODIUM CHLORIDE 0.9 % IV SOLN: INTRAVENOUS | Status: AC

## 2020-12-18 NOTE — Consult Note (Signed)
[facility] Infectious [facility]                                                                                        Patient Identification: Patient Name: Courtney Davies MRN: [id number] [place] Date: 12/01/2020  8:48 AM Today's Date: 12/18/2020 Reason for consult: Fevers  Requesting provider: Domenic Polite  Active Problems:   Cardiac arrest [facility])   Type 1 diabetes mellitus without complication ([place])   Hypertension   GERD (gastroesophageal reflux disease)   Anemia   Encephalopathy acute   Acute systolic heart failure ([place])   Dysphagia   Goals of care, counseling/discussion   Antibiotics: None currently   Lines/Tubes: PIV, LLQ gastrostomy tube +  Assessment Fever/leukocytosis with no obvious source No respiratory/GI or GU symptoms. No diarrhea or wounds per RN. No central lines. No limb/joint swelling on limited. However, on examination of PEG tube site there was a leakage, no obvious redness/drainage.  LFTs unremarkable  UA and urine cx negative for UTI  Chest Xray and CT abdomen/pelvis unremarkable except " Stranding and edema in the subcutaneous fat surrounding the region of gastrostomy tube placement"   Others  V Fib arrest  Anoxic encephalopathy  Dysphagia S/p PEG  DM2   Comments- limited physical exam as patient is  very uncooperative to me/RN   Recommendations  No obvious source of fever identified, OK to start empirically on ceftriaxone  Follow up blood cultures ( taken before abtx) Monitor fever curve and CBC w diff tomorrow  Rest of the management as per the primary team. Please call with questions or concerns.  Thank you for the consult  Rosiland Oz, MD Infectious Disease Physician [facility] [address] [place], [place] [address] Phone: [phone]  Fax:  [phone]  __________________________________________________________________________________________________________ HPI and Hospital Course: 45 year old female with PMH of HTN, DM2, vitamin B12 deficiency, GERD, OA who presented to the ED on 8/18 as OSH V. fib approximately 10 minutes to rest.  Patient was diagnosed with COVID 4 weeks prior to the event and had recovered fine.  Patient was initially admitted to the ICU and on 8/23, patient was successfully extubated to nasal cannula and was subsequently transferred to medical floor.  Patient had hypoxic encephalopathy and a PEG tube was placed given anticipation of prolonged neurologic recovery.  Patient also completed 7-day course of antibiotics for concern for aspiration pneumonitis/pneumonia and has been off antibiotics since 8/25.  Patient was afebrile until 9/2 when she started having low-grade fever associated with uptrending leukocytosis with concern for sepsis with no obvious source   ROS: unobtainable given patient's clinical condition   Past Medical History:  Diagnosis Date   Anemia    Diabetes type 2, controlled ([place])    GERD (gastroesophageal reflux disease)    Hypertension    Past Surgical History:  Procedure Laterality Date   IR GASTROSTOMY TUBE MOD SED  12/13/2020     Scheduled Meds:  acetaminophen (TYLENOL) oral liquid 160 mg/5 mL  650 mg Per Tube Q6H   amiodarone  200 mg Per Tube BID   carvedilol  6.25 mg  Per Tube BID WC   enoxaparin (LOVENOX) injection  40 mg Subcutaneous Daily   feeding supplement (PROSource TF)  45 mL Per Tube TID   free water  250 mL Per Tube Q6H   insulin aspart  0-20 Units Subcutaneous Q4H   insulin glargine-yfgn  20 Units Subcutaneous BID   losartan  25 mg Per Tube Daily   pantoprazole sodium  40 mg Per Tube Daily   Continuous Infusions:  sodium chloride     feeding supplement (JEVITY 1.5 CAL/FIBER) 1,000 mL (12/17/20 1435)   PRN Meds:.albuterol, ibuprofen, labetalol, morphine  injection, ondansetron (ZOFRAN) IV, oxyCODONE  Allergies  Allergen Reactions   Other Anaphylaxis and Hives    Hair Glue   Codeine Nausea And Vomiting   Latex Hives   Nitrofurantoin Rash   Social History   Socioeconomic History   Marital status: Single    Spouse name: Not on file   Number of children: Not on file   Years of education: Not on file   Highest education level: Not on file  Occupational History   Not on file  Tobacco Use   Smoking status: Unknown   Smokeless tobacco: Not on file  Vaping Use   Vaping Use: Unknown  Substance and Sexual Activity   Alcohol use: Not Currently   Drug use: Never   Sexual activity: Not on file  Other Topics Concern   Not on file  Social History Narrative   Not on file   Social Determinants of Health   Financial Resource Strain: Not on file  Food Insecurity: Not on file  Transportation Needs: Not on file  Physical Activity: Not on file  Stress: Not on file  Social Connections: Not on file  Intimate Partner Violence: Not on file   History reviewed. No pertinent family history.  Vitals BP 116/66 (BP Location: Left Arm)   Pulse (!) 110   Temp (!) 102.3 F (39.1 C)   Resp 18   Ht 5' 6"  (1.676 m)   Wt 103.4 kg   SpO2 100%   BMI 36.78 kg/m    Physical Exam Constitutional:  appears comfortable and not in acute distress    Comments:   Cardiovascular:     Rate and Rhythm: could not auscultate heart, she would resist    Heart sounds:    Pulmonary:     Effort: Pulmonary effort is normal.     Comments:   Abdominal:     Palpations: Abdomen is soft.     Tenderness: PEG site looks OK but leaking tube feed   Musculoskeletal:        General: unable to assess  Skin:    Comments: No lesions or rashes   Neurological:     General: moves all extremities spontaneously   Psychiatric:        Mood and Affect: unable to assess this time.    Pertinent Microbiology Results for orders placed or performed during the  hospital encounter of 12/01/20  Resp Panel by RT-PCR (Flu A&B, Covid) Nasopharyngeal Swab     Status: Abnormal   Collection Time: 12/01/20 10:08 AM   Specimen: Nasopharyngeal Swab; Nasopharyngeal(NP) swabs in vial transport medium  Result Value Ref Range Status   SARS Coronavirus 2 by RT PCR POSITIVE (A) NEGATIVE Final    Comment: RESULT CALLED TO, READ BACK BY AND VERIFIED WITH: EMILY DIXON 12/01/2020 @1138  BY JW (NOTE) SARS-CoV-2 target nucleic acids are DETECTED.  The SARS-CoV-2 RNA is generally detectable in upper respiratory specimens  during the acute phase of infection. Positive results are indicative of the presence of the identified virus, but do not rule out bacterial infection or co-infection with other pathogens not detected by the test. Clinical correlation with patient history and other diagnostic information is necessary to determine patient infection status. The expected result is Negative.  Fact Sheet for Patients: EntrepreneurPulse.com.au  Fact Sheet for Healthcare Providers: IncredibleEmployment.be  This test is not yet approved or cleared by the [place] FDA and  has been authorized for detection and/or diagnosis of SARS-CoV-2 by FDA under an Emergency Use Authorization (EUA).  This EUA will remain in effect (meaning this test can be  used) for the duration of  the COVID-19 declaration under Section 564(b)(1) of the Act, 21 U.S.C. section 360bbb-3(b)(1), unless the authorization is terminated or revoked sooner.     Influenza A by PCR NEGATIVE NEGATIVE Final   Influenza B by PCR NEGATIVE NEGATIVE Final    Comment: (NOTE) The Xpert Xpress SARS-CoV-2/FLU/RSV plus assay is intended as an aid in the diagnosis of influenza from Nasopharyngeal swab specimens and should not be used as a sole basis for treatment. Nasal washings and aspirates are unacceptable for Xpert Xpress SARS-CoV-2/FLU/RSV testing.  Fact Sheet for  Patients: EntrepreneurPulse.com.au  Fact Sheet for Healthcare Providers: IncredibleEmployment.be  This test is not yet approved or cleared by the [place] FDA and has been authorized for detection and/or diagnosis of SARS-CoV-2 by FDA under an Emergency Use Authorization (EUA). This EUA will remain in effect (meaning this test can be used) for the duration of the COVID-19 declaration under Section 564(b)(1) of the Act, 21 U.S.C. section 360bbb-3(b)(1), unless the authorization is terminated or revoked.  Performed at [facility], [place] [address], [place], [place] [address]   Blood culture (routine x 2)     Status: None   Collection Time: 12/01/20 10:11 AM   Specimen: BLOOD LEFT HAND  Result Value Ref Range Status   Specimen Description BLOOD LEFT HAND  Final   Special Requests   Final    BOTTLES DRAWN AEROBIC ONLY Blood Culture results may not be optimal due to an inadequate volume of blood received in culture bottles   Culture   Final    NO GROWTH 5 DAYS Performed at [facility], [place] [address] [place], [place] [address]    Report Status 12/06/2020 FINAL  Final  Blood culture (routine x 2)     Status: None   Collection Time: 12/01/20 10:20 AM   Specimen: BLOOD RIGHT FOREARM  Result Value Ref Range Status   Specimen Description BLOOD RIGHT FOREARM  Final   Special Requests   Final    BOTTLES DRAWN AEROBIC AND ANAEROBIC Blood Culture adequate volume   Culture   Final    NO GROWTH 5 DAYS Performed at [facility], [place] [address], [place], [place] [address]    Report Status 12/06/2020 FINAL  Final  MRSA Next Gen by PCR, Nasal     Status: None   Collection Time: 12/02/20  6:11 AM   Specimen: Nasal Mucosa; Nasal Swab  Result Value Ref Range Status   MRSA by PCR Next Gen NOT DETECTED NOT DETECTED Final    Comment: (NOTE) The GeneXpert MRSA Assay (FDA approved for NASAL specimens only), is one component of a  comprehensive MRSA colonization surveillance program. It is not intended to diagnose MRSA infection nor to guide or monitor treatment for MRSA infections. Test performance is not FDA  approved in patients less than 14 years old. Performed at [facility], [place] [address], [place], [place] [address]   Culture, Respiratory w Gram Stain     Status: None   Collection Time: 12/03/20 12:10 AM   Specimen: Tracheal Aspirate; Respiratory  Result Value Ref Range Status   Specimen Description TRACHEAL ASPIRATE  Final   Special Requests NONE  Final   Gram Stain   Final    ABUNDANT WBC PRESENT,BOTH PMN AND MONONUCLEAR NO ORGANISMS SEEN    Culture   Final    NO GROWTH 2 DAYS Performed at [facility], [address] [place], [place] [address]    Report Status 12/06/2020 FINAL  Final  SARS CORONAVIRUS 2 (TAT 6-24 HRS) Nasopharyngeal Nasopharyngeal Swab     Status: Abnormal   Collection Time: 12/15/20  2:05 PM   Specimen: Nasopharyngeal Swab  Result Value Ref Range Status   SARS Coronavirus 2 POSITIVE (A) NEGATIVE Final    Comment: (NOTE) SARS-CoV-2 target nucleic acids are DETECTED.  The SARS-CoV-2 RNA is generally detectable in upper and lower respiratory specimens during the acute phase of infection. Positive results are indicative of the presence of SARS-CoV-2 RNA. Clinical correlation with patient history and other diagnostic information is  necessary to determine patient infection status. Positive results do not rule out bacterial infection or co-infection with other viruses.  The expected result is Negative.  Fact Sheet for Patients: SugarRoll.be  Fact Sheet for Healthcare Providers: https://www.woods-mathews.com/  This test is not yet approved or cleared by the [place] FDA and  has been authorized for detection and/or diagnosis of SARS-CoV-2 by FDA under an Emergency Use Authorization (EUA). This EUA will remain  in  effect (meaning this test can be used) for the duration of the COVID-19 declaration under Section 564(b)(1) of the Act, 21 U. S.C. section 360bbb-3(b)(1), unless the authorization is terminated or revoked sooner.   Performed at [facility], [place] [address] [place], [place] [address]   Urine Culture     Status: None   Collection Time: 12/17/20  8:49 AM   Specimen: In/Out Cath Urine  Result Value Ref Range Status   Specimen Description IN/OUT CATH URINE  Final   Special Requests NONE  Final   Culture   Final    NO GROWTH Performed at [facility], [place] [address], [place], [place] [address]    Report Status 12/18/2020 FINAL  Final     Pertinent Lab seen by me: CBC Latest Ref Rng & Units 12/18/2020 12/17/2020 12/16/2020  WBC 4.0 - 10.5 K/uL 25.7(H) 20.4(H) 12.8(H)  Hemoglobin 12.0 - 15.0 g/dL 11.1(L) 12.8 12.7  Hematocrit 36.0 - 46.0 % 34.8(L) 40.9 40.8  Platelets 150 - 400 K/uL 314 375 333   CMP Latest Ref Rng & Units 12/18/2020 12/17/2020 12/16/2020  Glucose 70 - 99 mg/dL 251(H) 227(H) 165(H)  BUN 6 - 20 mg/dL 34(H) 27(H) 24(H)  Creatinine 0.44 - 1.00 mg/dL 1.20(H) 1.10(H) 0.87  Sodium 135 - 145 mmol/L 129(L) 131(L) 130(L)  Potassium 3.5 - 5.1 mmol/L 4.7 4.7 4.0  Chloride 98 - 111 mmol/L 100 101 101  CO2 22 - 32 mmol/L 18(L) 21(L) 21(L)  Calcium 8.9 - 10.3 mg/dL 8.7(L) 9.1 9.1  Total Protein 6.5 - 8.1 g/dL - 7.8 -  Total Bilirubin 0.3 - 1.2 mg/dL - 0.1(L) -  Alkaline Phos 38 - 126 U/L - 139(H) -  AST 15 - 41 U/L - 27 -  ALT 0 - 44 U/L - 32 -  ,  Pertinent Imagings/Other Imagings Plain films and CT images have been personally visualized and interpreted; radiology reports have been reviewed. Decision making incorporated into the Impression / Recommendations.  Chest Xray 12/18/20 FINDINGS: Normal heart size when allowing for portable technique. Unremarkable mediastinal contours. Artifact from EKG leads. There is no edema, consolidation, effusion, or  pneumothorax.  IMPRESSION: Low volume chest without acute finding.  CT abdomen/pelvis  FINDINGS: Lower chest: No acute abnormality.   Hepatobiliary: No focal liver abnormality is seen. No gallstones, gallbladder wall thickening, or biliary dilatation.   Pancreas: Unremarkable. No pancreatic ductal dilatation or surrounding inflammatory changes.   Spleen: Normal in size without focal abnormality.   Adrenals/Urinary Tract: Adrenal glands are unremarkable. Kidneys are normal, without renal calculi, focal lesion, or hydronephrosis. Bladder is unremarkable.   Stomach/Bowel: Percutaneous gastrostomy tube in appropriate position with the retention bumper at the level of the anterior gastric wall in the distal body of the stomach. Stranding and edema in the subcutaneous fat surrounding the region of gastrostomy tube placement may relate to a mild amount of bleeding at the time of tube placement with no evidence of focal abscess or discrete fluid collection. However, some degree cellulitis cannot be excluded.   No evidence of bowel obstruction, ileus or free intraperitoneal air. Retained contrast is present in the colon. No evidence of colonic injury or fistula.   Vascular/Lymphatic: No significant vascular findings are present. No enlarged abdominal or pelvic lymph nodes.   Reproductive: Status post hysterectomy. No adnexal masses.   Other: Midline ventral hernia inferior to the umbilicus contains fat. No focal abscess or ascites.   Musculoskeletal: No acute or significant osseous findings.   IMPRESSION: Normal positioning of recently placed gastrostomy tube within the body of the stomach. Stranding and edema in the subcutaneous fat surrounding the region of gastrostomy tube placement may relate to a mild amount of bleeding at the time of tube placement with no evidence of focal abscess or discrete fluid collection. However, some degree cellulitis cannot be excluded.  I  spent more than 70  minutes for this patient encounter including review of prior medical records/discussing diagnostics and treatment plan with the patient/family/coordinate care with primary/other specialits with greater than 50% of time in face to face encounter.   Electronically signed by:   Rosiland Oz, MD Infectious Disease Physician [facility] Infect[facility] Disease Pager: [phone]

## 2020-12-18 NOTE — Progress Notes (Signed)
HOSPITAL MEDICINE OVERNIGHT EVENT NOTE    Nursing the patient has exhibited a yellow MEWS score and had a fever of 101.4 F.  Chart reviewed, patient initially admitted on 8/18 for V. fib arrest status post intubation and admission to the intensive care unit s/p extubation on 8/23.  Patient has recent COVID positivity but is no longer infectious.  Patient has been exhibiting low-grade temperatures in the past several days with recent urinalysis being unremarkable.  CT imaging of the abdomen pelvis revealed possible stranding and edema surrounding the region of the PEG tube however nursing reports that there is no evidence of redness tenderness or discharge from or around the PEG site.  Will obtain a chest x-ray which has not been done since 8/22.  We will additionally obtain procalcitonin and CRP as well as a fresh set of 2 blood cultures.  Treating fever with as needed Tylenol.  Vernelle Emerald  MD [facility]

## 2020-12-18 NOTE — Progress Notes (Addendum)
PROGRESS NOTE  Courtney Davies TGG:[id number] DOB: 1975/07/22 DOA: 12/01/2020 PCP: Pcp, No   LOS: 17 days   Brief narrative:  45 years old female with past medical history of hypertension, vitamin B12 deficiency, GERD, obstructive sleep apnea, presented to the hospital on 12/01/2020 with ventricular fibrillation for around 10 minutes.  Patient was initially admitted to the ICU for V. fib arrest on 12/01/20 and ECG showed no ischemic changes.  On 12/06/2020, patient was successfully extubated to nasal cannula.  Patient has history of being COVID positive.  CTA was negative for pulmonary embolism.  Patient was subsequently considered stable for transfer out of the ICU.  During hospitalization, patient was Continued on cortrak tube feeding.   Then PEG placed, tolerating TFs  Now w/ fevers  Assessment/Plan:  Ventricular fibrillation arrest with acute respiratory failure. Anoxic encephalopathy. -Likely need prolonged rehab process -2D echocardiogram - LV ejection fraction of 40 to 45% with global hypokinesis -Seen and followed by cardiology this admission, not appropriate for ischemic work-up at this time, to be considered following neurological improvement  -Continue amiodarone,Coreg. -Electrolytes within normal range  SIRS, fever/leukocytosis -Source unclear to me, chest x-ray urinalysis, CT abdomen pelvis are unremarkable -Continue IV fluids, may need to start empiric antibiotics, blood cultures drawn this morning -Will check CRP and procalcitonin is slow and request ID input  Dysphagia -Following anoxic brain injury -Status post palliative care meeting, decision made for PEG tube placement,  -NG tube removed, started tube feeds via PEG tube, tolerating this  Acute systolic heart failure.  Currently compensated.  2D echocardiogram showed reduced LV function with LV ejection fraction of 40 to 45% with global hypokinesis..   -Continue coreg and losartan   Transient atrial  fibrillation -Noted to have transient A. fib and SVT this admission -Now stable on amiodarone and Coreg, given transient episode cardiology recommended against long-term anticoagulation  Thyroid nodules -Noted on ultrasound needs follow-up for this -TSH and free T4 were within normal limits  DM type II -Trulicity on hold, hemoglobin A1c was 6.9,  -CBGs remain elevated, started on glargine, will increase dose  Aspiration pneumonitis. Resolved   Debility, deconditioning.   PT, OT on board.  Recommending skilled nursing facility placement.  Nutrition.  tube feeds via PEG tube   DVT prophylaxis: Lovenox   Code Status: Full code Family Communication: No family at bedside, called and updated sister Charleston Poot  Status is: Inpatient  Remains inpatient appropriate because:IV treatments appropriate due to intensity of illness or inability to take PO and Inpatient level of care appropriate due to severity of illness  Dispo: The patient is from: Home              Anticipated d/c is to: Skilled nursing facility placement              Patient currently is not medically stable to d/c.   Difficult to place patient Yes  Consultants: PCCM  Procedures: Intubation and mechanical ventilation.   Extubation Cortrak tube tube placement PEG tube placed in interventional radiology 8/30  Anti-infectives:  None at this time  Subjective: -Appears uncomfortable, moaning  Objective: Vitals:   12/18/20 0750 12/18/20 1000  BP: 116/66   Pulse: (!) 110   Resp: 18   Temp: (!) 101.7 F (38.7 C) (!) 102.3 F (39.1 C)  SpO2: 100%     Intake/Output Summary (Last 24 hours) at 12/18/2020 1113 Last data filed at 12/18/2020 0456 Gross per 24 hour  Intake 2220.72 ml  Output --  Net 2220.72 ml   Filed Weights   12/16/20 0321 12/17/20 0325 12/18/20 0433  Weight: 102.5 kg 97.7 kg 103.4 kg   Body mass index is 36.78 kg/m.   Physical Exam:  General: Obese female uncomfortable appearing, eyes  open, mumbles few words, does not consistently follow commands, unable to assess orientation HEENT: No JVD CVS: S1-S2, regular rate rhythm Lungs: Decreased breath sounds to bases Abdomen: Soft, obese, nontender, PEG tube noted, abdominal binder Extremities: No edema  Neuro: Awake alert, mumbles, intermittently follows commands, able to say a few words  skin: No rashes on exposed skin   Data Review: I have personally reviewed the following laboratory data and studies,  CBC: Recent Labs  Lab 12/13/20 0458 12/15/20 0604 12/16/20 0208 12/17/20 0124 12/18/20 0131  WBC 13.6* 11.5* 12.8* 20.4* 25.7*  HGB 13.4 12.9 12.7 12.8 11.1*  HCT 42.5 40.7 40.8 40.9 34.8*  MCV 71.1* 71.4* 71.3* 72.0* 71.6*  PLT 352 343 333 375 144   Basic Metabolic Panel: Recent Labs  Lab 12/12/20 0743 12/13/20 0458 12/14/20 0239 12/15/20 0604 12/16/20 0208 12/17/20 0124 12/18/20 0131  NA 134* 134* 132* 130* 130* 131* 129*  K 4.6 5.0 4.8 4.6 4.0 4.7 4.7  CL 105 102 102 101 101 101 100  CO2 22 20* 21* 20* 21* 21* 18*  GLUCOSE 214* 162* 254* 201* 165* 227* 251*  BUN 20 26* 27* 25* 24* 27* 34*  CREATININE 0.89 0.97 1.01* 0.92 0.87 1.10* 1.20*  CALCIUM 9.1 9.6 9.2 9.2 9.1 9.1 8.7*  MG 1.8 2.1 2.0  --   --   --   --    Liver Function Tests: Recent Labs  Lab 12/17/20 0124  AST 27  ALT 32  ALKPHOS 139*  BILITOT 0.1*  PROT 7.8  ALBUMIN 3.2*    No results for input(s): LIPASE, AMYLASE in the last 168 hours. No results for input(s): AMMONIA in the last 168 hours. Cardiac Enzymes: No results for input(s): CKTOTAL, CKMB, CKMBINDEX, TROPONINI in the last 168 hours. BNP (last 3 results) Recent Labs    12/01/20 0913  BNP 37.2    ProBNP (last 3 results) No results for input(s): PROBNP in the last 8760 hours.  CBG: Recent Labs  Lab 12/17/20 1600 12/17/20 2019 12/18/20 0002 12/18/20 0423 12/18/20 0816  GLUCAP 168* 255* 255* 262* 300*   Recent Results (from the past 240 hour(s))  SARS  CORONAVIRUS 2 (TAT 6-24 HRS) Nasopharyngeal Nasopharyngeal Swab     Status: Abnormal   Collection Time: 12/15/20  2:05 PM   Specimen: Nasopharyngeal Swab  Result Value Ref Range Status   SARS Coronavirus 2 POSITIVE (A) NEGATIVE Final    Comment: (NOTE) SARS-CoV-2 target nucleic acids are DETECTED.  The SARS-CoV-2 RNA is generally detectable in upper and lower respiratory specimens during the acute phase of infection. Positive results are indicative of the presence of SARS-CoV-2 RNA. Clinical correlation with patient history and other diagnostic information is  necessary to determine patient infection status. Positive results do not rule out bacterial infection or co-infection with other viruses.  The expected result is Negative.  Fact Sheet for Patients: SugarRoll.be  Fact Sheet for Healthcare Providers: https://www.woods-mathews.com/  This test is not yet approved or cleared by the [place] FDA and  has been authorized for detection and/or diagnosis of SARS-CoV-2 by FDA under an Emergency Use Authorization (EUA). This EUA will remain  in effect (meaning this test can be used) for the duration of the COVID-19 declaration under Section  564(b)(1) of the Act, 21 U. S.C. section 360bbb-3(b)(1), unless the authorization is terminated or revoked sooner.   Performed at [facility], [place] [address], [place], [place] [address]   Urine Culture     Status: None   Collection Time: 12/17/20  8:49 AM   Specimen: In/Out Cath Urine  Result Value Ref Range Status   Specimen Description IN/OUT CATH URINE  Final   Special Requests NONE  Final   Culture   Final    NO GROWTH Performed at [facility], [place] [address] [place], [place] [address]    Report Status 12/18/2020 FINAL  Final       Studies: CT ABDOMEN PELVIS W CONTRAST  Result Date: 12/17/2020 CLINICAL DATA:  Abdominal pain and fever. Status post placement of percutaneous  gastrostomy tube on 12/13/2020. EXAM: CT ABDOMEN AND PELVIS WITH CONTRAST TECHNIQUE: Multidetector CT imaging of the abdomen and pelvis was performed using the standard protocol following bolus administration of intravenous contrast. CONTRAST:  30m OMNIPAQUE IOHEXOL 350 MG/ML SOLN COMPARISON:  CT of the abdomen without contrast on 12/11/2020 FINDINGS: Lower chest: No acute abnormality. Hepatobiliary: No focal liver abnormality is seen. No gallstones, gallbladder wall thickening, or biliary dilatation. Pancreas: Unremarkable. No pancreatic ductal dilatation or surrounding inflammatory changes. Spleen: Normal in size without focal abnormality. Adrenals/Urinary Tract: Adrenal glands are unremarkable. Kidneys are normal, without renal calculi, focal lesion, or hydronephrosis. Bladder is unremarkable. Stomach/Bowel: Percutaneous gastrostomy tube in appropriate position with the retention bumper at the level of the anterior gastric wall in the distal body of the stomach. Stranding and edema in the subcutaneous fat surrounding the region of gastrostomy tube placement may relate to a mild amount of bleeding at the time of tube placement with no evidence of focal abscess or discrete fluid collection. However, some degree cellulitis cannot be excluded. No evidence of bowel obstruction, ileus or free intraperitoneal air. Retained contrast is present in the colon. No evidence of colonic injury or fistula. Vascular/Lymphatic: No significant vascular findings are present. No enlarged abdominal or pelvic lymph nodes. Reproductive: Status post hysterectomy. No adnexal masses. Other: Midline ventral hernia inferior to the umbilicus contains fat. No focal abscess or ascites. Musculoskeletal: No acute or significant osseous findings. IMPRESSION: Normal positioning of recently placed gastrostomy tube within the body of the stomach. Stranding and edema in the subcutaneous fat surrounding the region of gastrostomy tube placement may  relate to a mild amount of bleeding at the time of tube placement with no evidence of focal abscess or discrete fluid collection. However, some degree cellulitis cannot be excluded. Electronically Signed   By: GAletta EdouardM.D.   On: 12/17/2020 15:35   DG Chest Port 1 View  Result Date: 12/18/2020 CLINICAL DATA:  Ventricular fibrillation EXAM: PORTABLE CHEST 1 VIEW COMPARISON:  12/05/2020 FINDINGS: Normal heart size when allowing for portable technique. Unremarkable mediastinal contours. Artifact from EKG leads. There is no edema, consolidation, effusion, or pneumothorax. IMPRESSION: Low volume chest without acute finding. Electronically Signed   By: JMonte FantasiaM.D.   On: 12/18/2020 07:11      PDomenic Polite MD  [facility] 12/18/2020  If 7PM-7AM, please contact night-coverage

## 2020-12-18 NOTE — TOC Progression Note (Signed)
Transition of Care [facility]) - Progression Note    Patient Details  Name: Courtney Davies MRN: [id number] Date of Birth: October 14, 1975  Transition of Care [facility]) CM/SW [place], [place] Phone Number: [phone] 12/18/2020, 12:02 PM  Clinical Narrative:     CSW spoke with [name] at [facility] and she stated that pt's authorization is not back and more than likely will not come back over the weekend.  TOC team will continue to assist with discharge planning needs.   Expected Discharge Plan: [place] Barriers to Discharge: Continued Medical Work up  Expected Discharge Plan and Services Expected Discharge Plan: [place] arrangements for the past 2 months: Single Family Home                                       Social Determinants of Health (SDOH) Interventions    Readmission Risk Interventions No flowsheet data found.

## 2020-12-18 NOTE — Progress Notes (Signed)
MEWS is yellow driven by the temperature of 101.4 and heart rate fluctuating from 110 to 120s. Administered scheduled Tylenol. Charge nurse and Physician notified of change in the MEWS

## 2020-12-19 DIAGNOSIS — I469 Cardiac arrest, cause unspecified: Secondary | ICD-10-CM | POA: Diagnosis not present

## 2020-12-19 DIAGNOSIS — Z978 Presence of other specified devices: Secondary | ICD-10-CM | POA: Diagnosis not present

## 2020-12-19 LAB — GLUCOSE, CAPILLARY
Glucose-Capillary: 195 mg/dL — ABNORMAL HIGH (ref 70–99)
Glucose-Capillary: 230 mg/dL — ABNORMAL HIGH (ref 70–99)
Glucose-Capillary: 245 mg/dL — ABNORMAL HIGH (ref 70–99)
Glucose-Capillary: 196 mg/dL — ABNORMAL HIGH (ref 70–99)
Glucose-Capillary: 222 mg/dL — ABNORMAL HIGH (ref 70–99)

## 2020-12-19 LAB — CBC
HCT: 31.9 % — ABNORMAL LOW (ref 36.0–46.0)
Hemoglobin: 10 g/dL — ABNORMAL LOW (ref 12.0–15.0)
MCH: 22.5 pg — ABNORMAL LOW (ref 26.0–34.0)
MCHC: 31.3 g/dL (ref 30.0–36.0)
MCV: 71.7 fL — ABNORMAL LOW (ref 80.0–100.0)
Platelets: 329 10*3/uL (ref 150–400)
RBC: 4.45 MIL/uL (ref 3.87–5.11)
RDW: 15.7 % — ABNORMAL HIGH (ref 11.5–15.5)
WBC: 21 10*3/uL — ABNORMAL HIGH (ref 4.0–10.5)
nRBC: 0 % (ref 0.0–0.2)

## 2020-12-19 LAB — COMPREHENSIVE METABOLIC PANEL
ALT: 36 U/L (ref 0–44)
AST: 28 U/L (ref 15–41)
Albumin: 2.4 g/dL — ABNORMAL LOW (ref 3.5–5.0)
Alkaline Phosphatase: 101 U/L (ref 38–126)
Anion gap: 9 (ref 5–15)
BUN: 34 mg/dL — ABNORMAL HIGH (ref 6–20)
CO2: 18 mmol/L — ABNORMAL LOW (ref 22–32)
Calcium: 8.6 mg/dL — ABNORMAL LOW (ref 8.9–10.3)
Chloride: 104 mmol/L (ref 98–111)
Creatinine, Ser: 1.14 mg/dL — ABNORMAL HIGH (ref 0.44–1.00)
GFR, Estimated: >60 mL/min (ref >60)
Glucose, Bld: 170 mg/dL — ABNORMAL HIGH (ref 70–99)
Potassium: 4.3 mmol/L (ref 3.5–5.1)
Sodium: 131 mmol/L — ABNORMAL LOW (ref 135–145)
Total Bilirubin: 0.4 mg/dL (ref 0.3–1.2)
Total Protein: 6.6 g/dL (ref 6.5–8.1)

## 2020-12-19 LAB — PROCALCITONIN: Procalcitonin: 0.43 ng/mL

## 2020-12-19 NOTE — Progress Notes (Signed)
Patient ID: Courtney Davies, female   DOB: 1975-10-27, 45 y.o.   MRN: [id number] Patient status post G-tube on 12/13/2020.  Request received to evaluate area due to leakage at site.  CT scan of abdomen pelvis performed on 9/3 showed normal positioning of gastrostomy tube with some stranding and edema in the subcu fat but no focal abscess or fluid collection.  Imaging studies were reviewed again by Dr. Jacelyn Grip further changes necessary.  On exam today there are multiple layers of drain sponge gauze under G-tube disc.  These gauze were removed and disc was made taut to skin surface.  1 layer of drain sponge gauze was placed under disc.  Skin site around G-tube appears okay.  Hopefully this will minimize leakage at site.  Disc needs to remain taut and close to skin surface to prevent leakage. Call [phone] with any additional questions regarding tube.

## 2020-12-19 NOTE — Progress Notes (Signed)
PROGRESS NOTE  Courtney Davies PTW:[id number] DOB: 1976/03/25 DOA: 12/01/2020 PCP: Pcp, No   LOS: 18 days   Brief narrative:  45 years old female with past medical history of hypertension, vitamin B12 deficiency, GERD, obstructive sleep apnea, presented to the hospital on 12/01/2020 with ventricular fibrillation for around 10 minutes.  Patient was initially admitted to the ICU for V. fib arrest on 12/01/20 and ECG showed no ischemic changes.  On 12/06/2020, patient was successfully extubated to nasal cannula.  Patient has history of being COVID positive.  CTA was negative for pulmonary embolism.  Patient was subsequently considered stable for transfer out of the ICU.  During hospitalization, patient was Continued on cortrak tube feeding.   Then PEG placed, tolerating TFs  Now w/ fevers  Assessment/Plan:  Ventricular fibrillation arrest with acute respiratory failure. Anoxic encephalopathy. -Likely need prolonged rehab process -2D echocardiogram - LV ejection fraction of 40 to 45% with global hypokinesis -Seen and followed by cardiology this admission, not appropriate for ischemic work-up at this time, to be considered following neurological improvement  -Continue amiodarone,Coreg. -Electrolytes within normal range  SIRS, fever/leukocytosis Abdominal wall cellulitis, around PEG tube -CT abdomen pelvis also raised above concern, this is more noticeable today -Started on IV ceftriaxone yesterday, blood cultures are pending, procalcitonin elevated -Appreciate infectious disease input -Will ask IR to evaluate PEG tube site, this was placed on 8/30  Dysphagia -Following anoxic brain injury -Status post palliative care meeting, decision made for PEG tube placement,  -NG tube removed, started tube feeds via PEG tube, tolerating this  Acute systolic heart failure.  Currently compensated.  2D echocardiogram showed reduced LV function with LV ejection fraction of 40 to 45% with global  hypokinesis..   -Continue coreg and losartan   Transient atrial fibrillation -Noted to have transient A. fib and SVT this admission -Now stable on amiodarone and Coreg, given transient episode cardiology recommended against long-term anticoagulation  Thyroid nodules -Noted on ultrasound needs follow-up for this -TSH and free T4 were within normal limits  DM type II -Trulicity on hold, hemoglobin A1c was 6.9,  -CBGs remain elevated, started on glargine, will increase dose  Aspiration pneumonitis. Resolved   Debility, deconditioning.   PT, OT on board.  Recommending skilled nursing facility placement.  Nutrition.  tube feeds via PEG tube   DVT prophylaxis: Lovenox   Code Status: Full code Family Communication: No family at bedside, called and updated sister Lizzy yesterday  Status is: Inpatient  Remains inpatient appropriate because:IV treatments appropriate due to intensity of illness or inability to take PO and Inpatient level of care appropriate due to severity of illness  Dispo: The patient is from: Home              Anticipated d/c is to: Skilled nursing facility placement              Patient currently is not medically stable to d/c.   Difficult to place patient Yes  Consultants: PCCM  Procedures: Intubation and mechanical ventilation.   Extubation Cortrak tube tube placement PEG tube placed in interventional radiology 8/30  Anti-infectives:  None at this time  Subjective: -Fevers down, less tearful today, tolerating tube feeds  Objective: Vitals:   12/19/20 0336 12/19/20 0818  BP: 119/69 (!) 152/81  Pulse:  95  Resp: 18   Temp: 97.7 F (36.5 C)   SpO2:      Intake/Output Summary (Last 24 hours) at 12/19/2020 1225 Last data filed at 12/19/2020 0002 Gross per  24 hour  Intake 2271.89 ml  Output --  Net 2271.89 ml   Filed Weights   12/17/20 0325 12/18/20 0433 12/19/20 0336  Weight: 97.7 kg 103.4 kg 102.2 kg   Body mass index is 36.38 kg/m.    Physical Exam:  General: Obese female uncomfortable appearing, moaning, labs says a few words, inconsistently follows commands  HEENT: No JVD CVS: S1-S2, regular rate rhythm Lungs: Decreased breath sounds the bases Abdomen: Soft, obese, mild erythema warmth and tenderness around the PEG tube site with minimal purulence, abdominal binder Extremities: No edema  Neuro: Awake alert, mumbles, intermittently follows commands, able to say a few words  skin: No rashes on exposed skin   Data Review: I have personally reviewed the following laboratory data and studies,  CBC: Recent Labs  Lab 12/15/20 0604 12/16/20 0208 12/17/20 0124 12/18/20 0131 12/19/20 0055  WBC 11.5* 12.8* 20.4* 25.7* 21.0*  HGB 12.9 12.7 12.8 11.1* 10.0*  HCT 40.7 40.8 40.9 34.8* 31.9*  MCV 71.4* 71.3* 72.0* 71.6* 71.7*  PLT 343 333 375 314 465   Basic Metabolic Panel: Recent Labs  Lab 12/13/20 0458 12/14/20 0239 12/15/20 0604 12/16/20 0208 12/17/20 0124 12/18/20 0131 12/19/20 0055  NA 134* 132* 130* 130* 131* 129* 131*  K 5.0 4.8 4.6 4.0 4.7 4.7 4.3  CL 102 102 101 101 101 100 104  CO2 20* 21* 20* 21* 21* 18* 18*  GLUCOSE 162* 254* 201* 165* 227* 251* 170*  BUN 26* 27* 25* 24* 27* 34* 34*  CREATININE 0.97 1.01* 0.92 0.87 1.10* 1.20* 1.14*  CALCIUM 9.6 9.2 9.2 9.1 9.1 8.7* 8.6*  MG 2.1 2.0  --   --   --   --   --    Liver Function Tests: Recent Labs  Lab 12/17/20 0124 12/19/20 0055  AST 27 28  ALT 32 36  ALKPHOS 139* 101  BILITOT 0.1* 0.4  PROT 7.8 6.6  ALBUMIN 3.2* 2.4*    No results for input(s): LIPASE, AMYLASE in the last 168 hours. No results for input(s): AMMONIA in the last 168 hours. Cardiac Enzymes: No results for input(s): CKTOTAL, CKMB, CKMBINDEX, TROPONINI in the last 168 hours. BNP (last 3 results) Recent Labs    12/01/20 0913  BNP 37.2    ProBNP (last 3 results) No results for input(s): PROBNP in the last 8760 hours.  CBG: Recent Labs  Lab 12/18/20 2046  12/18/20 2355 12/19/20 0331 12/19/20 0722 12/19/20 1137  GLUCAP 237* 118* 195* 230* 245*   Recent Results (from the past 240 hour(s))  SARS CORONAVIRUS 2 (TAT 6-24 HRS) Nasopharyngeal Nasopharyngeal Swab     Status: Abnormal   Collection Time: 12/15/20  2:05 PM   Specimen: Nasopharyngeal Swab  Result Value Ref Range Status   SARS Coronavirus 2 POSITIVE (A) NEGATIVE Final    Comment: (NOTE) SARS-CoV-2 target nucleic acids are DETECTED.  The SARS-CoV-2 RNA is generally detectable in upper and lower respiratory specimens during the acute phase of infection. Positive results are indicative of the presence of SARS-CoV-2 RNA. Clinical correlation with patient history and other diagnostic information is  necessary to determine patient infection status. Positive results do not rule out bacterial infection or co-infection with other viruses.  The expected result is Negative.  Fact Sheet for Patients: SugarRoll.be  Fact Sheet for Healthcare Providers: https://www.woods-mathews.com/  This test is not yet approved or cleared by the [place] FDA and  has been authorized for detection and/or diagnosis of SARS-CoV-2 by FDA under an Emergency  Use Authorization (EUA). This EUA will remain  in effect (meaning this test can be used) for the duration of the COVID-19 declaration under Section 564(b)(1) of the Act, 21 U. S.C. section 360bbb-3(b)(1), unless the authorization is terminated or revoked sooner.   Performed at [facility], [place] [address] [place], [place] [address]   Urine Culture     Status: None   Collection Time: 12/17/20  8:49 AM   Specimen: In/Out Cath Urine  Result Value Ref Range Status   Specimen Description IN/OUT CATH URINE  Final   Special Requests NONE  Final   Culture   Final    NO GROWTH Performed at [facility], [place] [address], [place], [place] [address]    Report Status 12/18/2020 FINAL  Final   Culture, blood (routine x 2)     Status: None (Preliminary result)   Collection Time: 12/18/20  7:26 AM   Specimen: BLOOD  Result Value Ref Range Status   Specimen Description BLOOD SITE NOT SPECIFIED  Final   Special Requests AEROBIC BOTTLE ONLY Blood Culture adequate volume  Final   Culture   Final    NO GROWTH < 24 HOURS Performed at [facility], [place] [address], [place], [place] [address]    Report Status PENDING  Incomplete  Culture, blood (routine x 2)     Status: None (Preliminary result)   Collection Time: 12/18/20  7:26 AM   Specimen: BLOOD  Result Value Ref Range Status   Specimen Description BLOOD SITE NOT SPECIFIED  Final   Special Requests   Final    AEROBIC BOTTLE ONLY Blood Culture results may not be optimal due to an inadequate volume of blood received in culture bottles   Culture   Final    NO GROWTH < 24 HOURS Performed at [facility], [place] [address], [place], [place] [address]    Report Status PENDING  Incomplete       Studies: CT ABDOMEN PELVIS W CONTRAST  Result Date: 12/17/2020 CLINICAL DATA:  Abdominal pain and fever. Status post placement of percutaneous gastrostomy tube on 12/13/2020. EXAM: CT ABDOMEN AND PELVIS WITH CONTRAST TECHNIQUE: Multidetector CT imaging of the abdomen and pelvis was performed using the standard protocol following bolus administration of intravenous contrast. CONTRAST:  61m OMNIPAQUE IOHEXOL 350 MG/ML SOLN COMPARISON:  CT of the abdomen without contrast on 12/11/2020 FINDINGS: Lower chest: No acute abnormality. Hepatobiliary: No focal liver abnormality is seen. No gallstones, gallbladder wall thickening, or biliary dilatation. Pancreas: Unremarkable. No pancreatic ductal dilatation or surrounding inflammatory changes. Spleen: Normal in size without focal abnormality. Adrenals/Urinary Tract: Adrenal glands are unremarkable. Kidneys are normal, without renal calculi, focal lesion, or hydronephrosis. Bladder is unremarkable.  Stomach/Bowel: Percutaneous gastrostomy tube in appropriate position with the retention bumper at the level of the anterior gastric wall in the distal body of the stomach. Stranding and edema in the subcutaneous fat surrounding the region of gastrostomy tube placement may relate to a mild amount of bleeding at the time of tube placement with no evidence of focal abscess or discrete fluid collection. However, some degree cellulitis cannot be excluded. No evidence of bowel obstruction, ileus or free intraperitoneal air. Retained contrast is present in the colon. No evidence of colonic injury or fistula. Vascular/Lymphatic: No significant vascular findings are present. No enlarged abdominal or pelvic lymph nodes. Reproductive: Status post hysterectomy. No adnexal masses. Other: Midline ventral hernia inferior to the umbilicus contains fat. No focal abscess  or ascites. Musculoskeletal: No acute or significant osseous findings. IMPRESSION: Normal positioning of recently placed gastrostomy tube within the body of the stomach. Stranding and edema in the subcutaneous fat surrounding the region of gastrostomy tube placement may relate to a mild amount of bleeding at the time of tube placement with no evidence of focal abscess or discrete fluid collection. However, some degree cellulitis cannot be excluded. Electronically Signed   By: Aletta Edouard M.D.   On: 12/17/2020 15:35   DG Chest Port 1 View  Result Date: 12/18/2020 CLINICAL DATA:  Ventricular fibrillation EXAM: PORTABLE CHEST 1 VIEW COMPARISON:  12/05/2020 FINDINGS: Normal heart size when allowing for portable technique. Unremarkable mediastinal contours. Artifact from EKG leads. There is no edema, consolidation, effusion, or pneumothorax. IMPRESSION: Low volume chest without acute finding. Electronically Signed   By: Monte Fantasia M.D.   On: 12/18/2020 07:11      Domenic Polite, MD  [facility] 12/19/2020  If 7PM-7AM, please contact  night-coverage

## 2020-12-19 NOTE — Evaluation (Signed)
Speech Language Pathology Evaluation Patient Details Name: Courtney Davies MRN: [id number] DOB: February 14, 1976 Today's Date: 12/19/2020 Time: 0835-0900 SLP Time Calculation (min) (ACUTE ONLY): 25 min  Problem List:  Patient Active Problem List   Diagnosis Date Noted   Dysphagia    Goals of care, counseling/discussion    Acute systolic heart failure ([place])    Encephalopathy acute    Type 1 diabetes mellitus without complication ([place]) 92/33/0076   Hypertension 12/02/2020   GERD (gastroesophageal reflux disease) 12/02/2020   Anemia 12/02/2020   Cardiac arrest ([place]) 12/01/2020   Past Medical History:  Past Medical History:  Diagnosis Date   Anemia    Diabetes type 2, controlled ([place])    GERD (gastroesophageal reflux disease)    Hypertension    Past Surgical History:  Past Surgical History:  Procedure Laterality Date   IR GASTROSTOMY TUBE MOD SED  12/13/2020   HPI:  Pt is a 45 y/o female admitted 8/18 with Vfib arrest with ROSC, COVID. CTA negative for PE. ETT 8/18-23 (failed inital attempt to extubate 8/19). MRI 8/21 consistent with hypoxic/ischemic injury. CXR 8/21 RLL infiltrate. PMT 8/23: continued aggressive care; family very hopeful for continued improvement. PEG 8/30. PMH: B12 deficiency, DM, OA, bronchitis.   Assessment / Plan / Recommendation Clinical Impression  Pt demonstrates severe cognitive linguistic impairment impacting ability to participate in any functional task or expression of wants and needs. Pt is emotionally agitated at baseline, crying and shouting intermittently, increased interaction typically increases pts frustration and agitation. Her language without intervention is characterized by short, incomplete, emotional phrases, without nouns or verbs, indicating a component of expressive aphasia. "Whats going on?" "where is my..." "I want.Marland KitchenMarland KitchenOh man!" Pt cannot name familiar objects, but will attend briefly to task and repeat words. She can say her own name with a  delayed response. If asked if she wants to some food, pt may begin saying "Oh man, I want some food!" which may be perseverative or meaningful. Unfortunately she has severe agnosia with food, does not recognize it and rejects it by spitting when given tastes of food. Suspect that if her distress and agitation were managed she may be more able to attend to functional tasks as internal distractors keeps her from participating for more than a few moments.Will continue cogntive interventions to also address eating and drinking.    SLP Assessment  SLP Recommendation/Assessment: Patient needs continued Speech Lanaguage Pathology Services SLP Visit Diagnosis: Aphasia (R47.01);Cognitive communication deficit (R41.841)    Follow Up Recommendations  Skilled Nursing facility;24 hour supervision/assistance    Frequency and Duration min 2x/week  2 weeks      SLP Evaluation Cognition  Overall Cognitive Status: Impaired/Different from baseline Arousal/Alertness: Awake/alert Orientation Level: Oriented to person;Disoriented to time;Disoriented to situation;Disoriented to place Awareness: Impaired Awareness Impairment: Intellectual impairment;Emergent impairment;Anticipatory impairment Problem Solving: Impaired Problem Solving Impairment: Verbal basic;Functional basic Behaviors: Restless;Verbal agitation;Physical agitation;Poor frustration tolerance;Lability Safety/Judgment: Impaired       Comprehension  Auditory Comprehension Overall Auditory Comprehension: Impaired Yes/No Questions: Impaired Basic Biographical Questions: 51-75% accurate Conversation: Simple Interfering Components: Attention;Anxiety;Visual impairments Reading Comprehension Reading Status: Impaired Word level: Impaired    Expression Expression Primary Mode of Expression: Verbal Verbal Expression Overall Verbal Expression: Impaired Initiation: No impairment Automatic Speech: Name Repetition: No impairment Naming:  Impairment Responsive: 0-25% accurate Confrontation: Impaired Verbal Errors: Perseveration Effective Techniques:  (attempted most, no improvement)   Oral / Motor  Oral Motor/Sensory Function Overall Oral Motor/Sensory Function: Within functional limits Motor Speech Overall Motor Speech: Appears  within functional limits for tasks assessed   GO                    Nevayah Faust, Katherene Ponto 12/19/2020, 9:56 AM

## 2020-12-19 NOTE — Progress Notes (Signed)
  Speech Language Pathology Treatment: Dysphagia  Patient Details Name: Courtney Davies MRN: [id number] DOB: [birth year] Today's Date: 12/19/2020 Time: 0835-0900 SLP Time Calculation (min) (ACUTE ONLY): 25 min  Assessment / Plan / Recommendation Clinical Impression  Pt still not truly observed with PO due to severe agnosia with food offered. Pt will look at food briefly, say "I don't want that! I want food!" If ice cream, water, coke or cereal is touched to her lips she will lick lips and briefly masticate and spit out any trials and cry. If presented with food or drink on her tray she may reach out and touch it and knock it over, but does not grasp it to try and drink it. Pt seems to be more attentive to objects on her right. Believe that pt needs to see and smell food with increased frequency, though arrival of trays may be problematic to staff given pts behavior. Will plan to bring some very tempting finger foods like french fries next session, also a lidded plastic sippy cup.    HPI HPI: Pt is a 45 y/o female admitted 8/18 with Vfib arrest with ROSC, COVID. CTA negative for PE. ETT 8/18-23 (failed inital attempt to extubate 8/19). MRI 8/21 consistent with hypoxic/ischemic injury. CXR 8/21 RLL infiltrate. PMT 8/23: continued aggressive care; family very hopeful for continued improvement. PEG 8/30. PMH: B12 deficiency, DM, OA, bronchitis.      SLP Plan  Continue with current plan of care  Patient needs continued Speech Lanaguage Pathology Services    Recommendations  Diet recommendations: Other(comment) (star to offer finger foods and lidded drinks intermittently) Medication Administration: Via alternative means                Follow up Recommendations: Skilled Nursing facility;24 hour supervision/assistance SLP Visit Diagnosis: Dysphagia, unspecified (R13.10) Plan: Continue with current plan of care       GO               Herbie Baltimore, MA [place] Pager [phone] Office [phone]  Lynann Beaver 12/19/2020, 9:59 AM

## 2020-12-19 NOTE — TOC Progression Note (Signed)
Transition of Care [facility]) - Progression Note    Patient Details  Name: Courtney Davies MRN: [id number] Date of Birth: Sep 15, 1975  Transition of Care [facility]) CM/SW [place], [place] Phone Number: 12/19/2020, 10:57 AM  Clinical Narrative:     CSW spoke with [name] with [place] who confirmed that patients insurance authorization is still pending. CSW will continue to follow and assist with dc planning needs.  Expected Discharge Plan: [place] Barriers to Discharge: Continued Medical Work up  Expected Discharge Plan and Services Expected Discharge Plan: [place] arrangements for the past 2 months: Single Family Home                                       Social Determinants of Health (SDOH) Interventions    Readmission Risk Interventions No flowsheet data found.

## 2020-12-20 DIAGNOSIS — I469 Cardiac arrest, cause unspecified: Secondary | ICD-10-CM | POA: Diagnosis not present

## 2020-12-20 DIAGNOSIS — L03311 Cellulitis of abdominal wall: Secondary | ICD-10-CM | POA: Diagnosis not present

## 2020-12-20 DIAGNOSIS — R509 Fever, unspecified: Secondary | ICD-10-CM

## 2020-12-20 LAB — GLUCOSE, CAPILLARY
Glucose-Capillary: 200 mg/dL — ABNORMAL HIGH (ref 70–99)
Glucose-Capillary: 232 mg/dL — ABNORMAL HIGH (ref 70–99)
Glucose-Capillary: 276 mg/dL — ABNORMAL HIGH (ref 70–99)
Glucose-Capillary: 277 mg/dL — ABNORMAL HIGH (ref 70–99)

## 2020-12-20 MED ORDER — CEPHALEXIN 500 MG PO CAPS
500.0000 mg | ORAL_CAPSULE | Freq: Four times a day (QID) | ORAL | Status: AC
  Administered 2020-12-20 – 2020-12-24 (×9): 500 mg via ORAL
  Filled 2020-12-20 (×12): qty 1

## 2020-12-20 MED ORDER — FREE WATER
200.0000 mL | Freq: Every day | Status: DC
  Administered 2020-12-20 – 2020-12-21 (×7): 200 mL

## 2020-12-20 MED ORDER — OSMOLITE 1.5 CAL PO LIQD
237.0000 mL | Freq: Every day | ORAL | Status: DC
  Administered 2020-12-20: 237 mL
  Administered 2020-12-20: 45 mL
  Administered 2020-12-21 (×5): 237 mL
  Filled 2020-12-20 (×26): qty 237

## 2020-12-20 MED ORDER — INSULIN GLARGINE-YFGN 100 UNIT/ML ~~LOC~~ SOLN
25.0000 [IU] | Freq: Two times a day (BID) | SUBCUTANEOUS | Status: DC
  Administered 2020-12-20 – 2020-12-25 (×7): 25 [IU] via SUBCUTANEOUS
  Filled 2020-12-20 (×15): qty 0.25

## 2020-12-20 NOTE — Progress Notes (Addendum)
I have seen and examined the patient. I have personally reviewed the clinical findings, laboratory findings, microbiological data and imaging studies. The assessment and treatment plan was discussed with the  Advance Practice Provider, Janene Madeira. I agree with her/his recommendations except following additions/corrections.  Fever has resolved and has been afebrile for more than 24 hrs. Leukocytosis is down-trending from 25 to 21 Appreciate IR evaluation of PEG tube  Restless and moving extremities with no purposeful movement  Disoriented  No new concerns per RN   Possible cellulitis of the PEG tube site, responding well to ceftriaxone  Continue ceftriaxone today  Transition to cephalexin tomorrow to complete 7 days course total  ID will sign off for now. Please call with questions.   Rosiland Oz, MD Infectious Disease Physician [facility] [address] [place], [place] [address] Phone: [phone]  Fax: [place] for Infectious Disease  Date of Admission:  12/01/2020      Total days of antibiotics 3  Ceftriaxone 9/04         ASSESSMENT: Courtney Davies is a 45 y.o. female admitted with VF arrest to ICU now with anoxic brain injury. She developed fevers and leukocytosis into her hospital stay - no localizing features or obvious causes for infection identified - CT scan of A/P revealed stranding/edema to the subcutaneous tissues of the abdominal wall surrounding g-tube placement. She seems to be responding well to treatment with Ceftriaxone - can probably change to cephalexin to complete 7d course for abdominal wall cellulitis.    PLAN: Continue ceftriaxone today, plan to transition to cephalexin tomorrow via tube.    Principal Problem:   Fever Active Problems:   Abdominal wall cellulitis   Cardiac arrest (HCC)   Type 1 diabetes mellitus without complication (HCC)   Hypertension   GERD  (gastroesophageal reflux disease)   Anemia   Encephalopathy acute   Acute systolic heart failure (HCC)   Dysphagia   Goals of care, counseling/discussion    acetaminophen (TYLENOL) oral liquid 160 mg/5 mL  650 mg Per Tube Q6H   amiodarone  200 mg Per Tube BID   carvedilol  6.25 mg Per Tube BID WC   [START ON 12/21/2020] cephALEXin  500 mg Oral Q6H   enoxaparin (LOVENOX) injection  40 mg Subcutaneous Daily   feeding supplement (PROSource TF)  45 mL Per Tube TID   free water  250 mL Per Tube Q6H   insulin aspart  0-20 Units Subcutaneous Q4H   insulin glargine-yfgn  20 Units Subcutaneous BID   losartan  25 mg Per Tube Daily   pantoprazole sodium  40 mg Per Tube Daily    SUBJECTIVE: Stated she was doing well today when first entering room. She became very quiet and nervous with our pharmacist in the room, we asked her to step out. She did calm down enough to let me examine her abdomen.  She did not offer much else purposefully during conversation - hitting herself and stuttering words that don't make sense.    Review of Systems: Review of Systems  Unable to perform ROS: Mental status change    Allergies  Allergen Reactions   Other Anaphylaxis and Hives    Hair Glue   Codeine Nausea And Vomiting   Latex Hives   Nitrofurantoin Rash    OBJECTIVE: Vitals:   12/19/20 2026 12/20/20 0017 12/20/20 0555 12/20/20 1102  BP: (!) 138/105 111/69 128/75 140/82  Pulse:  100 89 99 (!) 106  Resp: 20 15 18    Temp: 98.4 F (36.9 C) 98.3 F (36.8 C) 98.4 F (36.9 C)   TempSrc: Axillary Axillary Axillary   SpO2:  100%    Weight:   100.9 kg   Height:       Body mass index is 35.9 kg/m.  Physical Exam Vitals reviewed.  Cardiovascular:     Rate and Rhythm: Normal rate and regular rhythm.  Abdominal:     Comments: Surrounding PEG tube with clean / dry gauze sponge. There is some induration noted without any purulence.   Musculoskeletal:        General: Normal range of motion.      Cervical back: Normal range of motion.  Skin:    General: Skin is warm and dry.     Capillary Refill: Capillary refill takes less than 2 seconds.     Findings: No lesion or rash.  Neurological:     Mental Status: She is alert. She is disoriented.     Lab Results Lab Results  Component Value Date   WBC 21.0 (H) 12/19/2020   HGB 10.0 (L) 12/19/2020   HCT 31.9 (L) 12/19/2020   MCV 71.7 (L) 12/19/2020   PLT 329 12/19/2020    Lab Results  Component Value Date   CREATININE 1.14 (H) 12/19/2020   BUN 34 (H) 12/19/2020   NA 131 (L) 12/19/2020   K 4.3 12/19/2020   CL 104 12/19/2020   CO2 18 (L) 12/19/2020    Lab Results  Component Value Date   ALT 36 12/19/2020   AST 28 12/19/2020   ALKPHOS 101 12/19/2020   BILITOT 0.4 12/19/2020     Microbiology: Recent Results (from the past 240 hour(s))  SARS CORONAVIRUS 2 (TAT 6-24 HRS) Nasopharyngeal Nasopharyngeal Swab     Status: Abnormal   Collection Time: 12/15/20  2:05 PM   Specimen: Nasopharyngeal Swab  Result Value Ref Range Status   SARS Coronavirus 2 POSITIVE (A) NEGATIVE Final    Comment: (NOTE) SARS-CoV-2 target nucleic acids are DETECTED.  The SARS-CoV-2 RNA is generally detectable in upper and lower respiratory specimens during the acute phase of infection. Positive results are indicative of the presence of SARS-CoV-2 RNA. Clinical correlation with patient history and other diagnostic information is  necessary to determine patient infection status. Positive results do not rule out bacterial infection or co-infection with other viruses.  The expected result is Negative.  Fact Sheet for Patients: SugarRoll.be  Fact Sheet for Healthcare Providers: https://www.woods-mathews.com/  This test is not yet approved or cleared by the [place] FDA and  has been authorized for detection and/or diagnosis of SARS-CoV-2 by FDA under an Emergency Use Authorization (EUA). This EUA will  remain  in effect (meaning this test can be used) for the duration of the COVID-19 declaration under Section 564(b)(1) of the Act, 21 U. S.C. section 360bbb-3(b)(1), unless the authorization is terminated or revoked sooner.   Performed at [facility], [place] [address], [place], [place] [address]   Urine Culture     Status: None   Collection Time: 12/17/20  8:49 AM   Specimen: In/Out Cath Urine  Result Value Ref Range Status   Specimen Description IN/OUT CATH URINE  Final   Special Requests NONE  Final   Culture   Final    NO GROWTH Performed at [facility], [place] [address] [place], [place] [address]    Report Status 12/18/2020 FINAL  Final  Culture, blood (routine x 2)     Status: None (Preliminary result)   Collection Time: 12/18/20  7:26 AM   Specimen: BLOOD  Result Value Ref Range Status   Specimen Description BLOOD SITE NOT SPECIFIED  Final   Special Requests AEROBIC BOTTLE ONLY Blood Culture adequate volume  Final   Culture   Final    NO GROWTH 2 DAYS Performed at [facility], [address], [place], [place] [address]    Report Status PENDING  Incomplete  Culture, blood (routine x 2)     Status: None (Preliminary result)   Collection Time: 12/18/20  7:26 AM   Specimen: BLOOD  Result Value Ref Range Status   Specimen Description BLOOD SITE NOT SPECIFIED  Final   Special Requests   Final    AEROBIC BOTTLE ONLY Blood Culture results may not be optimal due to an inadequate volume of blood received in culture bottles   Culture   Final    NO GROWTH 2 DAYS Performed at [facility], [place] [address] [place], [place] [address]    Report Status PENDING  Incomplete    Janene Madeira, MSN, NP-C [place] for [facility]  12/20/2020  11:08 AM

## 2020-12-20 NOTE — Progress Notes (Signed)
Attempted IV plcaement, patient not cooperative; combative with attempted IV sticks. RN made aware.

## 2020-12-20 NOTE — Progress Notes (Signed)
  Speech Language Pathology Treatment: Dysphagia;Cognitive-Linquistic  Patient Details Name: Courtney Davies MRN: [id number] DOB: 01-26-1976 Today's Date: 12/20/2020 Time: 8786-7672 SLP Time Calculation (min) (ACUTE ONLY): 22 min  Assessment / Plan / Recommendation Clinical Impression  Pt seen after being cleaned up, pt quite calm, quietly resting and appears much less agitated. Even though she had just been calling out for food, when it was placed in front of her, she would not attend to it. If given verbal cues she may say yes to seeing seeing the food, but then says she doesn't want it and would not reach for it. SLP placed bacon in her hand and held it under her nose, pt said it smells good, but would not initiate self feeding and resisted placing the bacon in her mouth with hand over hand assist. When total assisted feeding was attempted, SLP was able to place the bacon between her teeth, but pt spit it out. She continued to resist a muffin and a biscuit. Aunt at bedside also tried without success. Will continue to leave diet order in place so that pt can have orders when therapy or staff can offer her foods. Family was also encouraged to bring favorite outside foods in and attempt to feed the pt. Pt needs frequent exposure to food without restriction in order to progress cognitively with feeding and attention. Family needs to feel that staff is not restricting the pts access to food as well. Will continue efforts.    HPI HPI: Pt is a 45 y/o female admitted 8/18 with Vfib arrest with ROSC, COVID. CTA negative for PE. ETT 8/18-23 (failed inital attempt to extubate 8/19). MRI 8/21 consistent with hypoxic/ischemic injury. CXR 8/21 RLL infiltrate. PMT 8/23: continued aggressive care; family very hopeful for continued improvement. PEG 8/30. PMH: B12 deficiency, DM, OA, bronchitis.      SLP Plan  Continue with current plan of care       Recommendations  Diet recommendations: Regular;Thin  liquid Medication Administration: Via alternative means                Follow up Recommendations: Skilled Nursing facility;24 hour supervision/assistance SLP Visit Diagnosis: Dysphagia, unspecified (R13.10) Plan: Continue with current plan of care       GO              Courtney Baltimore, MA [place] Pager [phone] Office [phone]   Lynann Beaver 12/20/2020, 11:03 AM

## 2020-12-20 NOTE — Progress Notes (Signed)
Physical Therapy Treatment Patient Details Name: Courtney Davies MRN: [id number] DOB: [birth year] Today's Date: 12/20/2020    History of Present Illness Pt is a 45 y/o female admitted 12/01/20 with Vfib arrest with ROSC; pt also (+) COVID. CTA negative for PE. MRI 8/21 consistent with hypoxic/ischemic injury. 8/21 RLL infiltrate seen on CXR from aspiration pna. Intubated 8/18-23 (failed initial attempt to extubate 8/19). S/p PEG placement 8/30. PMH includes B12 deficiency, DM, OA, bronchitis, dysphagia.    PT Comments    Patient received in bed, very easily internally and externally distracted. Suspect she may get overwhelmed when multiple people are in the room, however difficult to assess and unfortunately multiple people are needed to safely mobilize with therapy (for both staff and patient). Able to get to EOB today with totalAx3 however became increasingly agitated so returned to supine. Session very much limited by behaviors. Dropped frequency to 2x/week for now as factors beyond staff control (cognition and agitation/combativeness) are preventing [place] from making meaningful progress- however would definitely consider increasing frequency as these factors begin to improve in the future. Left in bed with alarm active, nursing staff aware of patient status.     Follow Up Recommendations  SNF;Supervision/Assistance - 24 hour;Other (comment) (VS LTACH)     Equipment Recommendations  Wheelchair (measurements PT);Wheelchair cushion (measurements PT);Hospital bed;Other (comment) (mechanical lift)    Recommendations for Other Services       Precautions / Restrictions Precautions Precautions: Fall Precaution Comments: flexiseal, G tube, abdominal binder Required Braces or Orthoses: Other Brace Other Brace: bil prevalon at rest    Mobility  Bed Mobility Overal bed mobility: Needs Assistance Bed Mobility: Supine to Sit;Sit to Supine     Supine to sit: Total assist;HOB elevated (+3) Sit to  supine: Total assist;HOB elevated (+3)   General bed mobility comments: required totalAx3 to get to EOB today, no initiation or cue following noted. Not following cues. TotalAx2 for scooting to head of bed. Noted to have soiled bed but agitation was escalating so let NT know.    Transfers                 General transfer comment: deferred- agitation  Ambulation/Gait             General Gait Details: Deferred- agitation   Stairs             Wheelchair Mobility    Modified Rankin (Stroke Patients Only)       Balance Overall balance assessment: Needs assistance Sitting-balance support: No upper extremity supported;Feet supported Sitting balance-Leahy Scale: Poor Sitting balance - Comments: unable to attain                                    Cognition Arousal/Alertness: Awake/alert Behavior During Therapy: Agitated;Anxious Overall Cognitive Status: Impaired/Different from baseline Area of Impairment: Attention;Following commands;Safety/judgement;Awareness;Problem solving                   Current Attention Level: Sustained   Following Commands: Follows one step commands inconsistently;Follows one step commands with increased time Safety/Judgement: Decreased awareness of safety;Decreased awareness of deficits Awareness: Intellectual Problem Solving: Slow processing;Decreased initiation;Difficulty sequencing;Requires verbal cues;Requires tactile cues General Comments: very easily agitated and distractable (internally and externally), low frustration tolerance and able to tell us she is frustrated that she can't communicate well; forgot how many children she has. Possibly a bit overwhelmed by multiple people in room?  diffiicult to assess. Did not follow commands today      Exercises      General Comments        Pertinent Vitals/Pain Pain Assessment: Faces Faces Pain Scale: Hurts a little bit Pain Location: generalized discomfort  with movement Pain Descriptors / Indicators: Grimacing;Moaning Pain Intervention(s): Limited activity within patient's tolerance;Monitored during session;Repositioned    Home Living                      Prior Function            PT Goals (current goals can now be found in the care plan section) Acute Rehab PT Goals Patient Stated Goal: to reduce pain PT Goal Formulation: Patient unable to participate in goal setting Time For Goal Achievement: 12/20/20 Potential to Achieve Goals: Fair Additional Goals Additional Goal #1: pt will follow single step commands 2/5 trials Progress towards PT goals: Not progressing toward goals - comment (limited by agitation/cognition)    Frequency    Min 2X/week      PT Plan Frequency needs to be updated;Discharge plan needs to be updated    Co-evaluation              AM-PAC PT "6 Clicks" Mobility   Outcome Measure  Help needed turning from your back to your side while in a flat bed without using bedrails?: Total Help needed moving from lying on your back to sitting on the side of a flat bed without using bedrails?: Total Help needed moving to and from a bed to a chair (including a wheelchair)?: Total Help needed standing up from a chair using your arms (e.g., wheelchair or bedside chair)?: Total Help needed to walk in hospital room?: Total Help needed climbing 3-5 steps with a railing? : Total 6 Click Score: 6    End of Session   Activity Tolerance: Treatment limited secondary to agitation Patient left: in bed;with call bell/phone within reach;with bed alarm set Nurse Communication: Mobility status;Need for lift equipment PT Visit Diagnosis: Other abnormalities of gait and mobility (R26.89);Other symptoms and signs involving the nervous system (R29.898);Muscle weakness (generalized) (M62.81);Difficulty in walking, not elsewhere classified (R26.2)     Time: 8372-9021 PT Time Calculation (min) (ACUTE ONLY): 23  min  Charges:  $Therapeutic Activity: 8-22 mins (co-tx with OT)                    Ann Lions PT, DPT, PN2   Supplemental Physical Therapist [place]    Pager [phone] Acute Rehab Office [phone]

## 2020-12-20 NOTE — Progress Notes (Addendum)
PROGRESS NOTE  Courtney Davies YQI:[id number] DOB: 1976/02/04 DOA: 12/01/2020 PCP: Pcp, No   LOS: 19 days   Brief narrative:  45 years old female with past medical history of hypertension, vitamin B12 deficiency, GERD, obstructive sleep apnea, was admitted to the ICU on 12/01/2020  for V. fib arrest on 12/01/20 . -  On 12/06/2020, patient was successfully extubated to nasal cannula.   -history of COVID-positive status.   -CTA was negative for pulmonary embolism.,  Echo with mild cardiomyopathy, seen by cardiology, recommended further work-up when neurological status has improved patient was subsequently considered stable for transfer out of the ICU. -Transferred from PCCM to [facility] service  Noted to have severe dysphagia, received nutrition and meds via cortrak -Seen by palliative care as well Then PEG placed in IR on 8/30 -SNF search was ongoing for prolonged rehab  9/4 developed w/ fevers, severe leukocytosis and tachycardia -Subsequently noted to have abdominal wall cellulitis around PEG tube, now improving   Subjective: -Fevers have finally resolved, moaning and crying less   Assessment/Plan:  Ventricular fibrillation arrest with acute respiratory failure. Anoxic encephalopathy. -Likely need prolonged rehabilitation -CTA was neg for PE -2D echocardiogram - LV ejection fraction of 40 to 45% with global hypokinesis -Seen and followed by cardiology this admission, not appropriate for ischemic work-up at this time, to be considered following neurological improvement  -Continue amiodarone,Coreg. -Electrolytes stable  SIRS, fever/leukocytosis Abdominal wall cellulitis, around PEG tube -CT abdomen pelvis also raised above concern, this was more noticeable since yesterday 9/5 -Started on IV ceftriaxone, blood cultures are negative -Appreciate infectious disease input, changing ceftriaxone to Keflex -Peg tube site evaluated by IR yesterday, originally placed on 8/30 -Patient did not  allow lab draws today, reattempt tomorrow  Dysphagia -Following anoxic brain injury -Status post palliative care meeting, decision made for PEG tube placement,  -NG tube removed, started tube feeds via PEG tube, tolerating this  Acute systolic heart failure.  Currently compensated.  2D echocardiogram showed reduced LV function with LV ejection fraction of 40 to 45% with global hypokinesis..   -Continue coreg and losartan   Transient atrial fibrillation -Noted to have transient A. fib and SVT this admission -Now stable on amiodarone and Coreg, given transient episode cardiology recommended against long-term anticoagulation  Thyroid nodules -Noted on ultrasound needs follow-up for this -TSH and free T4 were within normal limits  DM type II -Trulicity on hold, hemoglobin A1c was 6.9,  -CBGs remain elevated, started on glargine, will increase dose  Aspiration pneumonitis. Resolved   Debility, deconditioning.   PT, OT on board.  Recommending skilled nursing facility placement.  Nutrition.  tube feeds via PEG tube   DVT prophylaxis: Lovenox   Code Status: Full code Family Communication: No family at bedside, called and updated sister Lizzy 2 days ago, was unable to reach her today  Status is: Inpatient  Remains inpatient appropriate because:IV treatments appropriate due to intensity of illness or inability to take PO and Inpatient level of care appropriate due to severity of illness  Dispo: The patient is from: Home              Anticipated d/c is to: Skilled nursing facility placement              Patient currently is not medically stable to d/c.   Difficult to place patient Yes  Consultants: PCCM  Procedures: Intubation and mechanical ventilation.   Extubation Cortrak tube tube placement PEG tube placed in interventional radiology 8/30  Anti-infectives:  None at this time   Objective: Vitals:   12/20/20 0555 12/20/20 1102  BP: 128/75 140/82  Pulse: 99 (!)  106  Resp: 18   Temp: 98.4 F (36.9 C)   SpO2:      Intake/Output Summary (Last 24 hours) at 12/20/2020 1447 Last data filed at 12/20/2020 0610 Gross per 24 hour  Intake 1700.94 ml  Output 900 ml  Net 800.94 ml   Filed Weights   12/18/20 0433 12/19/20 0336 12/20/20 0555  Weight: 103.4 kg 102.2 kg 100.9 kg   Body mass index is 35.9 kg/m.   Physical Exam:  General: Obese female laying in bed, awake alert, appears more comfortable today, follows some commands, and answers few questions HEENT: No JVD CVS: S1-S2, regular rate rhythm Lungs: Decreased breath sounds to bases Abdomen: Soft, obese, erythema warmth and tenderness noted around the PEG tube site, minimal drainage Extremities: No edema  Neuro: Awake alert, mumbles, intermittently follows commands, able to say a few words  skin: As above  Data Review: I have personally reviewed the following laboratory data and studies,  CBC: Recent Labs  Lab 12/15/20 0604 12/16/20 0208 12/17/20 0124 12/18/20 0131 12/19/20 0055  WBC 11.5* 12.8* 20.4* 25.7* 21.0*  HGB 12.9 12.7 12.8 11.1* 10.0*  HCT 40.7 40.8 40.9 34.8* 31.9*  MCV 71.4* 71.3* 72.0* 71.6* 71.7*  PLT 343 333 375 314 562   Basic Metabolic Panel: Recent Labs  Lab 12/14/20 0239 12/15/20 0604 12/16/20 0208 12/17/20 0124 12/18/20 0131 12/19/20 0055  NA 132* 130* 130* 131* 129* 131*  K 4.8 4.6 4.0 4.7 4.7 4.3  CL 102 101 101 101 100 104  CO2 21* 20* 21* 21* 18* 18*  GLUCOSE 254* 201* 165* 227* 251* 170*  BUN 27* 25* 24* 27* 34* 34*  CREATININE 1.01* 0.92 0.87 1.10* 1.20* 1.14*  CALCIUM 9.2 9.2 9.1 9.1 8.7* 8.6*  MG 2.0  --   --   --   --   --    Liver Function Tests: Recent Labs  Lab 12/17/20 0124 12/19/20 0055  AST 27 28  ALT 32 36  ALKPHOS 139* 101  BILITOT 0.1* 0.4  PROT 7.8 6.6  ALBUMIN 3.2* 2.4*    No results for input(s): LIPASE, AMYLASE in the last 168 hours. No results for input(s): AMMONIA in the last 168 hours. Cardiac Enzymes: No  results for input(s): CKTOTAL, CKMB, CKMBINDEX, TROPONINI in the last 168 hours. BNP (last 3 results) Recent Labs    12/01/20 0913  BNP 37.2    ProBNP (last 3 results) No results for input(s): PROBNP in the last 8760 hours.  CBG: Recent Labs  Lab 12/19/20 1604 12/19/20 2016 12/20/20 0015 12/20/20 0505 12/20/20 0853  GLUCAP 196* 222* 232* 277* 200*   Recent Results (from the past 240 hour(s))  SARS CORONAVIRUS 2 (TAT 6-24 HRS) Nasopharyngeal Nasopharyngeal Swab     Status: Abnormal   Collection Time: 12/15/20  2:05 PM   Specimen: Nasopharyngeal Swab  Result Value Ref Range Status   SARS Coronavirus 2 POSITIVE (A) NEGATIVE Final    Comment: (NOTE) SARS-CoV-2 target nucleic acids are DETECTED.  The SARS-CoV-2 RNA is generally detectable in upper and lower respiratory specimens during the acute phase of infection. Positive results are indicative of the presence of SARS-CoV-2 RNA. Clinical correlation with patient history and other diagnostic information is  necessary to determine patient infection status. Positive results do not rule out bacterial infection or co-infection with other viruses.  The expected result is  Negative.  Fact Sheet for Patients: SugarRoll.be  Fact Sheet for Healthcare Providers: https://www.woods-mathews.com/  This test is not yet approved or cleared by the [place] FDA and  has been authorized for detection and/or diagnosis of SARS-CoV-2 by FDA under an Emergency Use Authorization (EUA). This EUA will remain  in effect (meaning this test can be used) for the duration of the COVID-19 declaration under Section 564(b)(1) of the Act, 21 U. S.C. section 360bbb-3(b)(1), unless the authorization is terminated or revoked sooner.   Performed at [facility], [place] [address], [place], [place] [address]   Urine Culture     Status: None   Collection Time: 12/17/20  8:49 AM   Specimen: In/Out Cath Urine   Result Value Ref Range Status   Specimen Description IN/OUT CATH URINE  Final   Special Requests NONE  Final   Culture   Final    NO GROWTH Performed at [facility], [place] [address], [place], [place] [address]    Report Status 12/18/2020 FINAL  Final  Culture, blood (routine x 2)     Status: None (Preliminary result)   Collection Time: 12/18/20  7:26 AM   Specimen: BLOOD  Result Value Ref Range Status   Specimen Description BLOOD SITE NOT SPECIFIED  Final   Special Requests AEROBIC BOTTLE ONLY Blood Culture adequate volume  Final   Culture   Final    NO GROWTH 2 DAYS Performed at [facility], [place] [address], [place], [place] [address]    Report Status PENDING  Incomplete  Culture, blood (routine x 2)     Status: None (Preliminary result)   Collection Time: 12/18/20  7:26 AM   Specimen: BLOOD  Result Value Ref Range Status   Specimen Description BLOOD SITE NOT SPECIFIED  Final   Special Requests   Final    AEROBIC BOTTLE ONLY Blood Culture results may not be optimal due to an inadequate volume of blood received in culture bottles   Culture   Final    NO GROWTH 2 DAYS Performed at [facility], [place] [address], [place], [place] [address]    Report Status PENDING  Incomplete     Domenic Polite, MD  [facility] 12/20/2020  If 7PM-7AM, please contact night-coverage

## 2020-12-20 NOTE — Progress Notes (Addendum)
Nutrition Follow-up  DOCUMENTATION CODES:   Not applicable  INTERVENTION:   Continue tube feeding via PEG, transition to bolus feedings: Change to Osmolite 1.5 237 ml (1 carton) 5 times per day  Continue Prosource TF 45 ml TID Free water flushes 200 ml 5 times per day (100 ml before and after each bolus)  Provides 1895 kcal, 108 gm protein, 1905 ml total free water daily.  NUTRITION DIAGNOSIS:   Inadequate oral intake related to acute illness as evidenced by NPO status.  Ongoing   GOAL:   Patient will meet greater than or equal to 90% of their needs  Met with TF  MONITOR:   TF tolerance, Labs, Diet advancement  REASON FOR ASSESSMENT:   Ventilator    ASSESSMENT:   45 yo female admitted post Vfib arrest, COVID+. PMH includes DM, GERD, dysphagia, B12 def  SLP following for dysphagia. Started on clear liquids 8/30. Upgraded to regular diet with thin liquids today. Patient has been refusing to take POs.   S/P PEG placement 8/30. Currently receiving Jevity 1.5 at 50 ml/h with Prosource TF 45 ml TID and free water flushes 250 ml QID to provide 1920 kcal, 110 gm protein, 1912 ml free water daily.  Family member in patient's room reports patient has had multiple loose stools every day over the past few days. Rectal tube has been removed. Will change to a formula without fiber. Will also transition to bolus feedings via PEG.  Patient seems restless, sitting in bed yelling that she does not want anything.   RN reports that there is a lot of mucus around PEG site.   Labs reviewed. Sodium 131 CBG: 232-277-200 Medications reviewed and include ss novolog q 4 hours, Semglee, Protonix.  Weight 100.9 kg today Admission weight 100 kg (8/18)  Diet Order:   Diet Order             Diet regular Room service appropriate? Yes; Fluid consistency: Thin  Diet effective now                   EDUCATION NEEDS:   Not appropriate for education at this time  Skin:  Skin  Assessment: Reviewed RN Assessment  Last BM:  9/6 (loose)  Height:   Ht Readings from Last 1 Encounters:  12/01/20 _0  (1.676 m)    Weight:   Wt Readings from Last 1 Encounters:  12/20/20 100.9 kg     BMI:  Body mass index is 35.9 kg/m.  Estimated Nutritional Needs:   Kcal:  1850-2050 kcals  Protein:  100-120 g  Fluid:  >/= 1.8 L   Lucas Mallow, RD, LDN, CNSC Please refer to Amion for contact information.

## 2020-12-20 NOTE — Progress Notes (Signed)
Inpatient Diabetes Program Recommendations  AACE/ADA: New Consensus Statement on Inpatient Glycemic Control (2015)  Target Ranges:  Prepandial:   less than 140 mg/dL      Peak postprandial:   less than 180 mg/dL (1-2 hours)      Critically ill patients:  140 - 180 mg/dL   Lab Results  Component Value Date   GLUCAP 200 (H) 12/20/2020   HGBA1C 6.9 (H) 12/02/2020    Review of Glycemic Control Results for Courtney Davies, Courtney Davies (MRN [id number]) as of 12/20/2020 10:13  Ref. Range 12/19/2020 07:22 12/19/2020 11:37 12/19/2020 16:04 12/19/2020 20:16 12/20/2020 00:15 12/20/2020 05:05 12/20/2020 08:53  Glucose-Capillary Latest Ref Range: 70 - 99 mg/dL 230 (H)  Novolog 7 units 245 (H)  Novolog 7 units 196 (H)  Novolog 4 units 222 (H)  Novolog 7 units 232 (H)  Novolog 7 units 277 (H)  Novolog 11 units 200 (H)   Diabetes history: DM 2 Outpatient Diabetes medications: Trulicity weekly Current orders for Inpatient glycemic control:  Semglee 20 units bid Novolog 0-20 units Q4  Jevity 50 ml/hour  Inpatient Diabetes Program Recommendations:    - Add Novolog 5 units Q4 Tube Feed Coverage   Thanks,  Tama Headings RN, MSN, BC-ADM Inpatient Diabetes Coordinator Team Pager [phone] (8a-5p)

## 2020-12-20 NOTE — Progress Notes (Addendum)
Occupational Therapy Treatment Patient Details Name: Courtney Davies MRN: [id number] DOB: 08-23-1975 Today's Date: 12/20/2020    History of present illness Pt is a 45 y/o female admitted 12/01/20 with Vfib arrest with ROSC; pt also (+) COVID. CTA negative for PE. MRI 8/21 consistent with hypoxic/ischemic injury. 8/21 RLL infiltrate seen on CXR from aspiration pna. Intubated 8/18-23 (failed initial attempt to extubate 8/19). S/p PEG placement 8/30. PMH includes B12 deficiency, DM, OA, bronchitis, dysphagia.   OT comments  Pt with minimal progress towards OT goals (updated and dates extended accordingly). Pt limited by poor attention to tasks and agitation/combativeness. Pt overall Total A for basic ADLs (combing hair, etc) and Total A x 2-3 for bed mobility with increasing agitation noted. Due to poor progress towards goals and difficulty participating in meaningful therapy sessions, will reduce minimum frequency to 1x/wk. If pt begins to demo improved participation, will increase therapy frequency again.    Follow Up Recommendations  SNF;LTACH;Supervision/Assistance - 24 hour    Equipment Recommendations  Other (comment) (defer to next venue)    Recommendations for Other Services      Precautions / Restrictions Precautions Precautions: Fall Precaution Comments: G tube, abdominal binder, urine/bowel incontinence Required Braces or Orthoses: Other Brace Other Brace: bil prevalon at rest Restrictions Weight Bearing Restrictions: No       Mobility Bed Mobility Overal bed mobility: Needs Assistance Bed Mobility: Supine to Sit;Sit to Supine     Supine to sit: Total assist;HOB elevated Sit to supine: Total assist;HOB elevated   General bed mobility comments: required totalAx3 to get to EOB today, no initiation or cue following noted. Not following cues. TotalAx2 for scooting to head of bed. Noted to have soiled bed but agitation was escalating so let NT know.    Transfers                  General transfer comment: deferred- agitation    Balance Overall balance assessment: Needs assistance Sitting-balance support: No upper extremity supported;Feet supported Sitting balance-Leahy Scale: Poor Sitting balance - Comments: heavy posterior lean                                   ADL either performed or assessed with clinical judgement   ADL Overall ADL's : Needs assistance/impaired     Grooming: Bed level;Brushing hair;Total assistance Grooming Details (indicate cue type and reason): initially agreeable to wash face but would no do so when handed washcloth. hand over hand provided for combing hair though pt would not sustain to task                               General ADL Comments: Attempted to gain participation in familiar ADLs and mobility though limited by cognitive impairments and agitation     Vision   Vision Assessment?: Vision impaired- to be further tested in functional context   Perception     Praxis      Cognition Arousal/Alertness: Awake/alert Behavior During Therapy: Agitated;Anxious Overall Cognitive Status: Impaired/Different from baseline Area of Impairment: Attention;Following commands;Safety/judgement;Awareness;Problem solving                   Current Attention Level: Sustained   Following Commands: Follows one step commands inconsistently;Follows one step commands with increased time Safety/Judgement: Decreased awareness of safety;Decreased awareness of deficits Awareness: Intellectual Problem Solving: Slow processing;Decreased initiation;Difficulty  sequencing;Requires verbal cues;Requires tactile cues General Comments: very easily agitated and distractable (internally and externally), low frustration tolerance and able to tell us she is frustrated that she can't communicate well; forgot how many children she has. Possibly a bit overwhelmed by multiple people in room? diffiicult to assess. Did not  follow commands today        Exercises     Shoulder Instructions       General Comments      Pertinent Vitals/ Pain       Pain Assessment: Faces Faces Pain Scale: Hurts a little bit Pain Location: generalized discomfort with movement Pain Descriptors / Indicators: Grimacing;Moaning Pain Intervention(s): Monitored during session  Home Living                                          Prior Functioning/Environment              Frequency  Min 1X/week        Progress Toward Goals  OT Goals(current goals can now be found in the care plan section)  Progress towards OT goals: Not progressing toward goals - comment  Acute Rehab OT Goals Patient Stated Goal: none stated OT Goal Formulation: Patient unable to participate in goal setting Time For Goal Achievement: 01/03/21 Potential to Achieve Goals: Fair ADL Goals Pt Will Perform Grooming: bed level;with max assist;sitting Pt Will Perform Upper Body Bathing: with max assist;bed level Pt Will Transfer to Toilet: with max assist;with +2 assist;bedside commode;stand pivot transfer Pt/caregiver will Perform Home Exercise Program: Increased strength;Both right and left upper extremity Additional ADL Goal #1: Pt will follow 1 step commands with 50% accuracy to optimize particpation in ADLs. Additional ADL Goal #2: Pt will complete bed mobility with mod assist as precursor to ADLs.  Plan Discharge plan remains appropriate;Frequency needs to be updated    Co-evaluation    PT/OT/SLP Co-Evaluation/Treatment: Yes Reason for Co-Treatment: Necessary to address cognition/behavior during functional activity;Complexity of the patient's impairments (multi-system involvement);For patient/therapist safety;To address functional/ADL transfers   OT goals addressed during session: ADL's and self-care      AM-PAC OT "6 Clicks" Daily Activity     Outcome Measure   Help from another person eating meals?: Total Help  from another person taking care of personal grooming?: Total Help from another person toileting, which includes using toliet, bedpan, or urinal?: Total Help from another person bathing (including washing, rinsing, drying)?: Total Help from another person to put on and taking off regular upper body clothing?: Total Help from another person to put on and taking off regular lower body clothing?: Total 6 Click Score: 6    End of Session    OT Visit Diagnosis: Other abnormalities of gait and mobility (R26.89);Muscle weakness (generalized) (M62.81);Other symptoms and signs involving the nervous system (R29.898);Other symptoms and signs involving cognitive function   Activity Tolerance Treatment limited secondary to agitation   Patient Left in bed;with call bell/phone within reach;with bed alarm set   Nurse Communication Mobility status;Other (comment) (incontinence)        Time: 6045-4098 OT Time Calculation (min): 23 min  Charges: OT General Charges $OT Visit: 1 Visit OT Treatments $Therapeutic Activity: 8-22 mins  Malachy Chamber, OTR/L Acute Rehab Services Office: [phone]    Courtney Davies 12/20/2020, 2:18 PM

## 2020-12-20 NOTE — TOC Progression Note (Signed)
Transition of Care [facility] - Progression Note    Patient Details  Name: Courtney Davies MRN: [id number] Date of Birth: 09/24/1975  Transition of Care [facility]) CM/SW [place], [place] Phone Number: 12/20/2020, 1:58 PM  Clinical Narrative:     Patient has SN[facility] bed at [facility] when medically ready. CSW spoke with [name] with [place] who confirmed that patients insurance authorization has been approved. CSW will continue to follow and assist with dc planning needs.  Expected Discharge Plan: [place] Barriers to Discharge: Continued Medical Work up  Expected Discharge Plan and Services Expected Discharge Plan: [place] arrangements for the past 2 months: Single Family Home                                       Social Determinants of Health (SDOH) Interventions    Readmission Risk Interventions No flowsheet data found.

## 2020-12-21 DIAGNOSIS — Z978 Presence of other specified devices: Secondary | ICD-10-CM | POA: Diagnosis not present

## 2020-12-21 DIAGNOSIS — L03311 Cellulitis of abdominal wall: Secondary | ICD-10-CM | POA: Diagnosis not present

## 2020-12-21 DIAGNOSIS — I5021 Acute systolic (congestive) heart failure: Secondary | ICD-10-CM | POA: Diagnosis not present

## 2020-12-21 DIAGNOSIS — I469 Cardiac arrest, cause unspecified: Secondary | ICD-10-CM | POA: Diagnosis not present

## 2020-12-21 LAB — BASIC METABOLIC PANEL
Anion gap: 9 (ref 5–15)
BUN: 21 mg/dL — ABNORMAL HIGH (ref 6–20)
CO2: 21 mmol/L — ABNORMAL LOW (ref 22–32)
Calcium: 8.8 mg/dL — ABNORMAL LOW (ref 8.9–10.3)
Chloride: 103 mmol/L (ref 98–111)
Creatinine, Ser: 0.6 mg/dL (ref 0.44–1.00)
GFR, Estimated: >60 mL/min (ref >60)
Glucose, Bld: 121 mg/dL — ABNORMAL HIGH (ref 70–99)
Potassium: 4.3 mmol/L (ref 3.5–5.1)
Sodium: 133 mmol/L — ABNORMAL LOW (ref 135–145)

## 2020-12-21 LAB — CBC
HCT: 32.2 % — ABNORMAL LOW (ref 36.0–46.0)
Hemoglobin: 10.2 g/dL — ABNORMAL LOW (ref 12.0–15.0)
MCH: 22.1 pg — ABNORMAL LOW (ref 26.0–34.0)
MCHC: 31.7 g/dL (ref 30.0–36.0)
MCV: 69.8 fL — ABNORMAL LOW (ref 80.0–100.0)
Platelets: 390 10*3/uL (ref 150–400)
RBC: 4.61 MIL/uL (ref 3.87–5.11)
RDW: 15.1 % (ref 11.5–15.5)
WBC: 16.5 10*3/uL — ABNORMAL HIGH (ref 4.0–10.5)
nRBC: 0 % (ref 0.0–0.2)

## 2020-12-21 LAB — GLUCOSE, CAPILLARY
Glucose-Capillary: 129 mg/dL — ABNORMAL HIGH (ref 70–99)
Glucose-Capillary: 130 mg/dL — ABNORMAL HIGH (ref 70–99)
Glucose-Capillary: 173 mg/dL — ABNORMAL HIGH (ref 70–99)
Glucose-Capillary: 182 mg/dL — ABNORMAL HIGH (ref 70–99)
Glucose-Capillary: 182 mg/dL — ABNORMAL HIGH (ref 70–99)
Glucose-Capillary: 191 mg/dL — ABNORMAL HIGH (ref 70–99)
Glucose-Capillary: 326 mg/dL — ABNORMAL HIGH (ref 70–99)

## 2020-12-21 MED ORDER — WHITE PETROLATUM EX OINT
TOPICAL_OINTMENT | CUTANEOUS | Status: AC
  Filled 2020-12-21: qty 28.35

## 2020-12-21 MED ORDER — DIPHENHYDRAMINE HCL 25 MG PO CAPS
50.0000 mg | ORAL_CAPSULE | Freq: Four times a day (QID) | ORAL | Status: DC | PRN
  Administered 2020-12-21: 50 mg via ORAL
  Filled 2020-12-21: qty 2

## 2020-12-21 MED ORDER — APIXABAN 2.5 MG PO TABS
2.5000 mg | ORAL_TABLET | Freq: Two times a day (BID) | ORAL | Status: DC
  Administered 2020-12-21 (×2): 2.5 mg
  Filled 2020-12-21 (×2): qty 1

## 2020-12-21 NOTE — Progress Notes (Signed)
PROGRESS NOTE  AKI BURDIN KGY:[id number] DOB: 1975/05/04 DOA: 12/01/2020 PCP: Pcp, No  LOS: 20 days   Brief narrative:  45 years old female with past medical history of hypertension, vitamin B12 deficiency, GERD, obstructive sleep apnea, was admitted to the ICU on 12/01/2020  for V. fib arrest on 12/01/20 . -  On 12/06/2020, patient was successfully extubated to nasal cannula.   -history of COVID-positive status.  Has some anoxic encephalopathy. -CTA was negative for pulmonary embolism.,  Echo with mild cardiomyopathy, seen by cardiology, recommended further work-up when neurological status has improved patient was subsequently considered stable for transfer out of the ICU. -Transferred from PCCM to [facility] service  Noted to have dysphagia, cognitive deficits, received nutrition and meds via cortrak -Dysphagia was predominantly cognitive, refusing p.o. meds and intake -Seen by palliative care as well Then PEG placed in IR on 8/30 -SNF search was ongoing for prolonged rehab  9/4 developed w/ fevers, severe leukocytosis and tachycardia -Subsequently noted to have abdominal wall cellulitis around PEG tube, now improving   Subjective: -Fevers down, crying and moaning less, refusing insulin, Lovenox shots  Assessment/Plan:  Ventricular fibrillation arrest with acute respiratory failure. Anoxic encephalopathy. -Likely need prolonged rehabilitation -CTA was neg for PE -2D echocardiogram - LV ejection fraction of 40 to 45% with global hypokinesis -Seen and followed by cardiology this admission, not appropriate for ischemic work-up at this time, to be considered following neurological improvement  -Continue amiodarone,Coreg. -Electrolytes stable -Discharge planning, TOC following  Fever/leukocytosis Abdominal wall cellulitis, around PEG tube -CT abdomen pelvis also raised above concern, on 9/5 developed noticeable tenderness erythema around PEG tube  -Started on IV ceftriaxone, blood  cultures are negative -Appreciate infectious disease input, changed ceftriaxone to Keflex -Peg tube site evaluated by IR yesterday, originally placed on 8/30 -Leukocytosis improving  Dysphagia, cognitive deficits -Following anoxic brain injury -Status post palliative care meeting, decision made for PEG tube placement,  -NG tube removed, started tube feeds via PEG tube, tolerating this -Dysphagia is primarily cognitive with patient refusing to eat, regular diet ordered, family advised to bring food, following discharge if p.o. intake improves, PEG tube could be removed  Acute systolic heart failure.  Currently compensated.  2D echocardiogram showed reduced LV function with LV ejection fraction of 40 to 45% with global hypokinesis..   -Continue coreg and losartan   Transient atrial fibrillation -Noted to have transient A. fib and SVT this admission -Now stable on amiodarone and Coreg, given transient episode cardiology recommended against long-term anticoagulation  Thyroid nodules -Noted on ultrasound needs follow-up for this -TSH and free T4 were within normal limits  DM type II -Trulicity on hold, hemoglobin A1c was 6.9,  -CBGs remain elevated, started on glargine, increased dose, limited by patient's refusal  Aspiration pneumonitis. Resolved   Debility, deconditioning.   PT, OT on board.  Recommending skilled nursing facility placement.  Nutrition.  tube feeds via PEG tube   DVT prophylaxis: Refusing Lovenox, started low-dose Eliquis for DVT prophylaxis apixaban (ELIQUIS) tablet 2.5 mg   Code Status: Full code Family Communication: No family at bedside, called and updated sister Charleston Poot today  Status is: Inpatient  Remains inpatient appropriate because:IV treatments appropriate due to intensity of illness or inability to take PO and Inpatient level of care appropriate due to severity of illness  Dispo: The patient is from: Home              Anticipated d/c is to: Skilled  nursing facility placement  Patient currently is not medically stable to d/c.   Difficult to place patient Yes  Consultants: PCCM  Procedures: Intubation and mechanical ventilation.   Extubation Cortrak tube tube placement PEG tube placed in interventional radiology 8/30   Objective: Vitals:   12/21/20 0314 12/21/20 0828  BP: (!) 139/95 120/69  Pulse: 96 97  Resp: 18   Temp: 97.7 F (36.5 C)   SpO2:      Intake/Output Summary (Last 24 hours) at 12/21/2020 1212 Last data filed at 12/20/2020 2256 Gross per 24 hour  Intake 237 ml  Output --  Net 237 ml   Filed Weights   12/19/20 0336 12/20/20 0555 12/21/20 0314  Weight: 102.2 kg 100.9 kg 100.7 kg   Body mass index is 35.83 kg/m.   Physical Exam:  General: Obese female laying in bed, awake alert, oriented to self only, answers few questions and follows commands intermittently HEENT: No JVD CVS: S1-S2, regular rate rhythm Lungs: Decreased breath sounds to bases Abdomen: Soft, obese, erythema warmth and tenderness noted around the PEG tube site, scant drainage Extremities: No edema  Neuro: Awake alert, mumbles, intermittently follows commands, able to say a few words  skin: As above  Data Review: I have personally reviewed the following laboratory data and studies,  CBC: Recent Labs  Lab 12/16/20 0208 12/17/20 0124 12/18/20 0131 12/19/20 0055 12/21/20 0611  WBC 12.8* 20.4* 25.7* 21.0* 16.5*  HGB 12.7 12.8 11.1* 10.0* 10.2*  HCT 40.8 40.9 34.8* 31.9* 32.2*  MCV 71.3* 72.0* 71.6* 71.7* 69.8*  PLT 333 375 314 329 242   Basic Metabolic Panel: Recent Labs  Lab 12/16/20 0208 12/17/20 0124 12/18/20 0131 12/19/20 0055 12/21/20 0611  NA 130* 131* 129* 131* 133*  K 4.0 4.7 4.7 4.3 4.3  CL 101 101 100 104 103  CO2 21* 21* 18* 18* 21*  GLUCOSE 165* 227* 251* 170* 121*  BUN 24* 27* 34* 34* 21*  CREATININE 0.87 1.10* 1.20* 1.14* 0.60  CALCIUM 9.1 9.1 8.7* 8.6* 8.8*   Liver Function Tests: Recent  Labs  Lab 12/17/20 0124 12/19/20 0055  AST 27 28  ALT 32 36  ALKPHOS 139* 101  BILITOT 0.1* 0.4  PROT 7.8 6.6  ALBUMIN 3.2* 2.4*    No results for input(s): LIPASE, AMYLASE in the last 168 hours. No results for input(s): AMMONIA in the last 168 hours. Cardiac Enzymes: No results for input(s): CKTOTAL, CKMB, CKMBINDEX, TROPONINI in the last 168 hours. BNP (last 3 results) Recent Labs    12/01/20 0913  BNP 37.2    ProBNP (last 3 results) No results for input(s): PROBNP in the last 8760 hours.  CBG: Recent Labs  Lab 12/20/20 2038 12/21/20 0055 12/21/20 0432 12/21/20 0754 12/21/20 1150  GLUCAP 276* 326* 129* 130* 182*   Recent Results (from the past 240 hour(s))  SARS CORONAVIRUS 2 (TAT 6-24 HRS) Nasopharyngeal Nasopharyngeal Swab     Status: Abnormal   Collection Time: 12/15/20  2:05 PM   Specimen: Nasopharyngeal Swab  Result Value Ref Range Status   SARS Coronavirus 2 POSITIVE (A) NEGATIVE Final    Comment: (NOTE) SARS-CoV-2 target nucleic acids are DETECTED.  The SARS-CoV-2 RNA is generally detectable in upper and lower respiratory specimens during the acute phase of infection. Positive results are indicative of the presence of SARS-CoV-2 RNA. Clinical correlation with patient history and other diagnostic information is  necessary to determine patient infection status. Positive results do not rule out bacterial infection or co-infection with other viruses.  The expected result is Negative.  Fact Sheet for Patients: SugarRoll.be  Fact Sheet for Healthcare Providers: https://www.woods-mathews.com/  This test is not yet approved or cleared by the [place] FDA and  has been authorized for detection and/or diagnosis of SARS-CoV-2 by FDA under an Emergency Use Authorization (EUA). This EUA will remain  in effect (meaning this test can be used) for the duration of the COVID-19 declaration under Section 564(b)(1) of  the Act, 21 U. S.C. section 360bbb-3(b)(1), unless the authorization is terminated or revoked sooner.   Performed at [facility], [place] [address], [place], [place] [address]   Urine Culture     Status: None   Collection Time: 12/17/20  8:49 AM   Specimen: In/Out Cath Urine  Result Value Ref Range Status   Specimen Description IN/OUT CATH URINE  Final   Special Requests NONE  Final   Culture   Final    NO GROWTH Performed at [facility], [place] [address], [place], [place] [address]    Report Status 12/18/2020 FINAL  Final  Culture, blood (routine x 2)     Status: None (Preliminary result)   Collection Time: 12/18/20  7:26 AM   Specimen: BLOOD  Result Value Ref Range Status   Specimen Description BLOOD SITE NOT SPECIFIED  Final   Special Requests AEROBIC BOTTLE ONLY Blood Culture adequate volume  Final   Culture   Final    NO GROWTH 3 DAYS Performed at [facility], [place] [address] [place], [place] [address]    Report Status PENDING  Incomplete  Culture, blood (routine x 2)     Status: None (Preliminary result)   Collection Time: 12/18/20  7:26 AM   Specimen: BLOOD  Result Value Ref Range Status   Specimen Description BLOOD SITE NOT SPECIFIED  Final   Special Requests   Final    AEROBIC BOTTLE ONLY Blood Culture results may not be optimal due to an inadequate volume of blood received in culture bottles   Culture   Final    NO GROWTH 3 DAYS Performed at [facility], [place] [address], [place], [place] [address]    Report Status PENDING  Incomplete     Domenic Polite, MD  [facility] 12/21/2020  If 7PM-7AM, please contact night-coverage

## 2020-12-21 NOTE — TOC Progression Note (Signed)
Transition of Care [facility]) - Progression Note    Patient Details  Name: Courtney Davies MRN: [id number] Date of Birth: Apr 13, 1976  Transition of Care [facility]) CM/SW [place], [place] Phone Number: 12/21/2020, 1:50 PM  Clinical Narrative:     Patient has SN[facility] bed at [facility] when medically ready. Insurance authorization has been approved. CSW will continue to follow and assist with dc planning needs.  Expected Discharge Plan: [place] Barriers to Discharge: Continued Medical Work up  Expected Discharge Plan and Services Expected Discharge Plan: [place] arrangements for the past 2 months: Single Family Home                                       Social Determinants of Health (SDOH) Interventions    Readmission Risk Interventions No flowsheet data found.

## 2020-12-21 NOTE — Plan of Care (Signed)

## 2020-12-22 DIAGNOSIS — L03311 Cellulitis of abdominal wall: Secondary | ICD-10-CM | POA: Diagnosis not present

## 2020-12-22 DIAGNOSIS — I469 Cardiac arrest, cause unspecified: Secondary | ICD-10-CM | POA: Diagnosis not present

## 2020-12-22 DIAGNOSIS — G9340 Encephalopathy, unspecified: Secondary | ICD-10-CM | POA: Diagnosis not present

## 2020-12-22 DIAGNOSIS — I5021 Acute systolic (congestive) heart failure: Secondary | ICD-10-CM | POA: Diagnosis not present

## 2020-12-22 LAB — GLUCOSE, CAPILLARY
Glucose-Capillary: 145 mg/dL — ABNORMAL HIGH (ref 70–99)
Glucose-Capillary: 161 mg/dL — ABNORMAL HIGH (ref 70–99)
Glucose-Capillary: 160 mg/dL — ABNORMAL HIGH (ref 70–99)
Glucose-Capillary: 200 mg/dL — ABNORMAL HIGH (ref 70–99)
Glucose-Capillary: 155 mg/dL — ABNORMAL HIGH (ref 70–99)

## 2020-12-22 MED ORDER — LORAZEPAM 2 MG/ML IJ SOLN
2.0000 mg | Freq: Once | INTRAMUSCULAR | Status: DC
  Filled 2020-12-22: qty 1

## 2020-12-22 MED ORDER — LORAZEPAM 1 MG PO TABS: 2.0000 mg | ORAL_TABLET | ORAL | Status: DC | PRN

## 2020-12-22 MED ORDER — HALOPERIDOL LACTATE 5 MG/ML IJ SOLN
5.0000 mg | Freq: Four times a day (QID) | INTRAMUSCULAR | Status: DC | PRN
  Administered 2020-12-23 – 2020-12-29 (×6): 5 mg via INTRAMUSCULAR
  Filled 2020-12-22 (×7): qty 1

## 2020-12-22 MED ORDER — HALOPERIDOL LACTATE 5 MG/ML IJ SOLN
INTRAMUSCULAR | Status: AC
  Administered 2020-12-22: 5 mg via INTRAMUSCULAR
  Filled 2020-12-22: qty 1

## 2020-12-22 MED ORDER — LORAZEPAM 1 MG PO TABS
2.0000 mg | ORAL_TABLET | ORAL | Status: DC | PRN
  Administered 2020-12-22: 2 mg
  Filled 2020-12-22: qty 2

## 2020-12-22 MED ORDER — NICOTINE 21 MG/24HR TD PT24
21.0000 mg | MEDICATED_PATCH | Freq: Every day | TRANSDERMAL | Status: DC
  Administered 2020-12-22 – 2021-01-02 (×10): 21 mg via TRANSDERMAL
  Filled 2020-12-22 (×11): qty 1

## 2020-12-22 MED ORDER — LORAZEPAM 2 MG/ML IJ SOLN
2.0000 mg | INTRAMUSCULAR | Status: DC | PRN
  Administered 2020-12-23 – 2020-12-24 (×5): 2 mg via INTRAMUSCULAR
  Filled 2020-12-22 (×4): qty 1

## 2020-12-22 MED ORDER — ENOXAPARIN SODIUM 40 MG/0.4ML IJ SOSY
40.0000 mg | PREFILLED_SYRINGE | INTRAMUSCULAR | Status: DC
  Administered 2020-12-24 – 2021-01-02 (×9): 40 mg via SUBCUTANEOUS
  Filled 2020-12-22 (×11): qty 0.4

## 2020-12-22 MED ORDER — LORAZEPAM 2 MG/ML IJ SOLN: 2.0000 mg | INTRAMUSCULAR | Status: DC | PRN

## 2020-12-22 NOTE — Progress Notes (Signed)
Pt agitated and continues to pull cardiac monitor. Refuses to have monitor placed back on. Dr Aileen Fass aware. Cardiac monitor d/c'd.

## 2020-12-22 NOTE — Progress Notes (Addendum)
Referring Physician(s): Aileen Fass, A.   Supervising Physician: Ruthann Cancer  Patient Status:  [facility] - In-pt  Chief Complaint:  S/p G tube placement with IR on 4/00/86, complicated by abdominal wall cellulitis development  Request for G tube eval   Subjective:  Pt laying in bed, not in acute distress.  Not able to answer questions, ROS not obtained.   Allergies: Other, Codeine, Latex, and Nitrofurantoin  Medications: Prior to Admission medications   Medication Sig Start Date End Date Taking? Authorizing Provider  folic acid (FOLVITE) 1 MG tablet Take 1 mg by mouth daily.    [provider]  HYDROcodone-acetaminophen (NORCO) 7.5-325 MG tablet Take 1 tablet by mouth 3 (three) times daily as needed for pain. 11/07/20   [provider]  LINZESS 290 MCG CAPS capsule Take 290 mcg by mouth daily. 12/01/20   [provider]  meloxicam (MOBIC) 15 MG tablet Take 15 mg by mouth daily. 06/07/20   [provider]  nirmatrelvir & ritonavir (PAXLOVID) 20 x 150 MG & 10 x 100MG TBPK Take 100-150 mg by mouth See admin instructions. follow package directions 11/10/20   [provider]  pantoprazole (PROTONIX) 40 MG tablet Take 40 mg by mouth 2 (two) times daily. 07/07/20   [provider]  phentermine (ADIPEX-P) 37.5 MG tablet Take 37.5 mg by mouth daily. 07/12/20   [provider]  PROAIR HFA 108 (90 Base) MCG/ACT inhaler Inhale 1-2 puffs into the lungs every 4 (four) hours as needed. 07/25/20   [provider]  tiZANidine (ZANAFLEX) 4 MG tablet Take 4 mg by mouth at bedtime. 09/27/20   [provider]  TRULICITY 4.5 PY/1.9JK SOPN Inject 4.5 mg into the skin once a week. 10/26/20   [provider]  Vitamin D, Ergocalciferol, (DRISDOL) 1.25 MG (50000 UNIT) CAPS capsule Take 50,000 Units by mouth once a week. 10/26/20   [provider]     Vital Signs: BP 131/75 (BP Location: Left Leg)   Pulse 82   Temp  98.4 F (36.9 C) (Axillary)   Resp 18   Ht 5' 6"  (1.676 m)   Wt 214 lb 1.1 oz (97.1 kg)   SpO2 100%   BMI 34.55 kg/m   Physical Exam Vitals reviewed.  Constitutional:      General: She is not in acute distress. HENT:     Head: Normocephalic and atraumatic.  Pulmonary:     Effort: Pulmonary effort is normal.  Abdominal:     General: Abdomen is flat.     Palpations: Abdomen is soft.  Musculoskeletal:     Cervical back: Neck supple.  Skin:    General: Skin is warm and dry.     Comments: ABD pad noted on mid abdomen covering the G tube.  Bumper was not cinched to skin, multiple split gauzes found under the bumper. Moderate amount of cloudy non odorous fluid noted on the dressing and around the G tube insertion site.  Skin around the insertion site is mildly hardened and erythematosus, warm to touch.     Neurological:     Mental Status: She is disoriented.    Imaging: No results found.  Labs:  CBC: Recent Labs    12/17/20 0124 12/18/20 0131 12/19/20 0055 12/21/20 0611  WBC 20.4* 25.7* 21.0* 16.5*  HGB 12.8 11.1* 10.0* 10.2*  HCT 40.9 34.8* 31.9* 32.2*  PLT 375 314 329 390    COAGS: Recent Labs    12/13/20 0458  INR 1.1  BMP: Recent Labs    12/17/20 0124 12/18/20 0131 12/19/20 0055 12/21/20 0611  NA 131* 129* 131* 133*  K 4.7 4.7 4.3 4.3  CL 101 100 104 103  CO2 21* 18* 18* 21*  GLUCOSE 227* 251* 170* 121*  BUN 27* 34* 34* 21*  CALCIUM 9.1 8.7* 8.6* 8.8*  CREATININE 1.10* 1.20* 1.14* 0.60  GFRNONAA >60 57* >60 >60    LIVER FUNCTION TESTS: Recent Labs    12/01/20 0913 12/02/20 0450 12/17/20 0124 12/19/20 0055  BILITOT 0.5 0.8 0.1* 0.4  AST 234* 144* 27 28  ALT 284* 232* 32 36  ALKPHOS 110 94 139* 101  PROT 5.7* 5.9* 7.8 6.6  ALBUMIN 2.8* 3.0* 3.2* 2.4*    Assessment and Plan:  45 year old female s/p G-tube placement with IR on 09/26/2447, complicated by abdominal wall cellulitis development confirmed by CT abdomen pelvis with  contrast on 12/17/2020.  ID was consulted, patient is on Keflex. IR was requested for evaluation of the G-tube.  Upon evaluation, the retention  bumper was not cinched to skin, multiple split gauzes found under the bumper. Moderate amount of cloudy non odorous fluid noted on the dressing and around the G tube insertion site.  Split gauzes were removed, the bumper was cinched to the skin w/o difficulty. Unclear if the drainage is gastric contact VS infection.  Skin around the insertion site is mildly hardened and erythematosus, warm to touch.  PLAN - May use the G tube for feeing as tolerated. - Keep the retention bumper cinched to skin to minimize drainage, do not place more than one gauze under the bumper.  - IR will continue to follow, will attempt to salvage the existing G-tube as it was recently placed,  a replacement of the G-tube may require creating a new tract.    Further treatment plan per TRH/ Pulmonary diseases  Appreciate and agree with the plan.  IR to follow.    Electronically Signed: Tera Mater, PA-C 12/22/2020, 11:35 AM   I spent a total of 25 Minutes at the the patient's bedside AND on the patient's hospital floor or unit, greater than 50% of which was counseling/coordinating care for G tube evaluation.

## 2020-12-22 NOTE — Progress Notes (Signed)
Pt's mother at bedside and is requesting for G tube to be removed. Also they are wanting to take her outside to smoke a cigarette. Pt's mother brought food from home and pt fed herself and ate 75 % of what they brought including two pieces of chicken and some potatoes. They said they are going to bring her food daily so she does not need the feeding tube. Dr Aileen Fass notified. Order received for nicotine patch.

## 2020-12-22 NOTE — Progress Notes (Signed)
Physical Therapy Treatment Patient Details Name: Courtney Davies MRN: [id number] DOB: 02-22-1976 Today's Date: 12/22/2020    History of Present Illness Pt is a 45 y/o female admitted 12/01/20 with Vfib arrest with ROSC; pt also (+) COVID. CTA negative for PE. MRI 8/21 consistent with hypoxic/ischemic injury. 8/21 RLL infiltrate seen on CXR from aspiration pna. Intubated 8/18-23 (failed initial attempt to extubate 8/19). S/p PEG placement 8/30. PMH includes B12 deficiency, DM, OA, bronchitis, dysphagia.    PT Comments    Pt did initiate movement of her legs towards R EOB to try to transition supine > sit, but then became agitated and resisted continuing. Thus, attempted to redirect towards performing lower extremity exercises. Educated pt on importance of mobility with her eventually agreeing to exercises, but pt needed repeated cues to remain on task as she would attempt to close her eyes after the first rep of each. Pt became increasingly agitated and appears to become over-stimulated easily, thus ceased progression of session. Will continue to follow acutely. Current recommendations remain appropriate.     Follow Up Recommendations  SNF;Supervision/Assistance - 24 hour;Other (comment) (VS LTACH)     Equipment Recommendations  Wheelchair (measurements PT);Wheelchair cushion (measurements PT);Hospital bed;Other (comment) (mechanical lift)    Recommendations for Other Services       Precautions / Restrictions Precautions Precautions: Fall Precaution Comments: G tube, abdominal binder, urine/bowel incontinence Required Braces or Orthoses: Other Brace Other Brace: bil prevalon at rest Restrictions Weight Bearing Restrictions: No    Mobility  Bed Mobility Overal bed mobility: Needs Assistance Bed Mobility: Supine to Sit           General bed mobility comments: Attempted to initiate supine > sit transition, with pt moving each leg towards R EOB with modA and delayed response but  then pt was resistive and did not want to get up, thus no success/completion of transition.    Transfers                 General transfer comment: deferred- agitation  Ambulation/Gait             General Gait Details: Deferred- agitation   Stairs             Wheelchair Mobility    Modified Rankin (Stroke Patients Only) Modified Rankin (Stroke Patients Only) Pre-Morbid Rankin Score: No symptoms Modified Rankin: Severe disability     Balance       Sitting balance - Comments: Pt refused to sit up EOB.                                    Cognition Arousal/Alertness: Awake/alert Behavior During Therapy: Agitated Overall Cognitive Status: Impaired/Different from baseline Area of Impairment: Attention;Following commands;Safety/judgement;Awareness;Problem solving;Memory                   Current Attention Level: Selective Memory: Decreased short-term memory Following Commands: Follows one step commands inconsistently;Follows one step commands with increased time Safety/Judgement: Decreased awareness of safety;Decreased awareness of deficits Awareness: Intellectual Problem Solving: Slow processing;Decreased initiation;Difficulty sequencing;Requires verbal cues;Requires tactile cues General Comments: very easily agitated and distractable (internally and externally), low frustration tolerance and easily over-stimulated. Pt with poor insight into her deficits and safety, stating she could walk and go home at this time, but was unwilling to attempt with therapist today. Pt forgetful of what balloons in room were and where they came from.  Exercises General Exercises - Lower Extremity Quad Sets: AAROM;Both;5 reps;Supine Heel Slides: AAROM;Both;5 reps;Supine    General Comments General comments (skin integrity, edema, etc.): Educated pt on importance of mobility; pt agreeable to lower extremity exercises after education and encouragement  but would then close her eyes and appear to pretend to fall asleep/ignore therapist after the first rep each time      Pertinent Vitals/Pain Pain Assessment: Faces Faces Pain Scale: Hurts a little bit Pain Location: legs Pain Descriptors / Indicators: Discomfort Pain Intervention(s): Limited activity within patient's tolerance;Monitored during session;Repositioned    Home Living                      Prior Function            PT Goals (current goals can now be found in the care plan section) Acute Rehab PT Goals Patient Stated Goal: to go home PT Goal Formulation: Patient unable to participate in goal setting Time For Goal Achievement: 01/05/21 Potential to Achieve Goals: Fair Additional Goals Additional Goal #1: Pt will follow single step commands 2/5 trials. Progress towards PT goals: Not progressing toward goals - comment (agitation limiting pt)    Frequency    Min 2X/week      PT Plan Current plan remains appropriate    Co-evaluation              AM-PAC PT "6 Clicks" Mobility   Outcome Measure  Help needed turning from your back to your side while in a flat bed without using bedrails?: Total Help needed moving from lying on your back to sitting on the side of a flat bed without using bedrails?: Total Help needed moving to and from a bed to a chair (including a wheelchair)?: Total Help needed standing up from a chair using your arms (e.g., wheelchair or bedside chair)?: Total Help needed to walk in hospital room?: Total Help needed climbing 3-5 steps with a railing? : Total 6 Click Score: 6    End of Session   Activity Tolerance: Treatment limited secondary to agitation Patient left: in bed;with call bell/phone within reach;with bed alarm set   PT Visit Diagnosis: Other abnormalities of gait and mobility (R26.89);Other symptoms and signs involving the nervous system (R29.898);Muscle weakness (generalized) (M62.81);Difficulty in walking, not  elsewhere classified (R26.2)     Time: 1341-1350 PT Time Calculation (min) (ACUTE ONLY): 9 min  Charges:  $Therapeutic Activity: 8-22 mins                     Moishe Spice, PT, DPT Acute Rehabilitation Services  Pager: [phone] Office: [phone]    Orvan Falconer 12/22/2020, 2:59 PM

## 2020-12-22 NOTE — Progress Notes (Signed)
Pt very agitated hospitalist notified about pt and new order received to give pt Haldol 5 mg IM.

## 2020-12-22 NOTE — Progress Notes (Signed)
[facility] PROGRESS NOTE    Progress Note  Courtney Davies  YOV:[id number] DOB: January 14, 1976 DOA: 12/01/2020 PCP: Pcp, No     Brief Narrative:   Courtney Davies is an 45 y.o. female past medical history significant for essential hypertension, vitamin B12 deficiency obstructive sleep apnea admitted to the ICU on 12/01/2020 for A. fib arrest, subsequently patient was extubated on 12/06/2020, there is a concern of anoxic brain injury, CTA of the chest was negative for PE.  2D echo was done that showed mild cardiomyopathy seen by cardiology and recommended further work-up once neurological status is improved.  Transfer out of the ICU and noted to have some dysphagia and cognitive deficit core track was placed started on nutrition dysphagia is predominantly cognitive in nature. Seen by palliative care, and PEG tube placed on 12/13/2020. And will probably need skilled nursing facility. On 12/18/2020 he developed fever with a severe leukocytosis and tachycardia subsequently noted to have abdominal wall cellulitis around PEG tube    Assessment/Plan:   V. fib arrest with acute respiratory failure with hypoxia and acute encephalopathy: CTA negative for PE.  2D echo showed low EF with global hypokinesia cardiology deemed not appropriate for ischemic work-up until neurological status improved. Continue amiodarone and Coreg. Discharge planning per [facility] to skilled nursing facility.  Abdominal wall cellulitis around PEG tube, Noticed on 12/19/2020 started on IV Rocephin cultures have been negative so far.  Infectious disease consulted who recommended to change to Keflex. He has remained afebrile and leukocytosis is improving.  Acute systolic heart failure: In the setting of V. fib arrest a 2D echo was done that showed an EF of 40% with global hypokinesia. Continue Coreg and losartan.  Paroxysmal atrial fibrillation: Rate controlled on amiodarone and Coreg.  Transient episode cardiology recommended  against long-term anticoagulation.  Thyroid nodule: Follow-up with PCP as an outpatient.  Type 2 diabetes mellitus: With an A1c of 6.9 continue long-acting insulin plus sliding scale.  Aspiration pneumonia: Resolved.  Ambulatory dysfunction/deconditioning: Physical therapy was consulted recommended skilled nursing facility.  Tube feedings: The patient continues to try to pull the PEG tube out, she has dislodged it, fortunately she is not bleeding. Will reconsult IR to reposition in place. Will reconsult palliative care to come address PEG tube pain goals of care.  DVT prophylaxis: lovenox Family Communication:mother Status is: Inpatient  Remains inpatient appropriate because:Hemodynamically unstable  Dispo: The patient is from: Home              Anticipated d/c is to: SNF              Patient currently is not medically stable to d/c.   Difficult to place patient No    Code Status:     Code Status Orders  (From admission, onward)           Start     Ordered   12/01/20 1304  Full code  Continuous        12/01/20 1305           Code Status History     This patient has a current code status but no historical code status.         IV Access:   Peripheral IV   Procedures and diagnostic studies:   No results found.   Medical Consultants:   None.   Subjective:    Courtney Davies nonverbal Sleepy  Objective:    Vitals:   12/21/20 1340 12/21/20 1704 12/21/20 2006 12/22/20 0500  BP: 137/87 120/77 132/78 131/75  Pulse: (!) 101 100 88 82  Resp: 18  18 18   Temp: 97.8 F (36.6 C)  97.9 F (36.6 C) 98.4 F (36.9 C)  TempSrc: Oral  Oral Axillary  SpO2:    100%  Weight:    97.1 kg  Height:       SpO2: 100 % O2 Flow Rate (L/min): 2 L/min FiO2 (%): 29 %  No intake or output data in the 24 hours ending 12/22/20 0719 Filed Weights   12/20/20 0555 12/21/20 0314 12/22/20 0500  Weight: 100.9 kg 100.7 kg 97.1 kg    Exam: General exam:  In no acute distress. Respiratory system: Good air movement and clear to auscultation. Cardiovascular system: S1 & S2 heard, RRR. No JVD. Gastrointestinal system: Abdomen is nondistended, soft and nontender.  Erythema around PEG tube, PEG tube is loose. Extremities: No pedal edema. Skin: No rashes, lesions or ulcers   Data Reviewed:    Labs: Basic Metabolic Panel: Recent Labs  Lab 12/16/20 0208 12/17/20 0124 12/18/20 0131 12/19/20 0055 12/21/20 0611  NA 130* 131* 129* 131* 133*  K 4.0 4.7 4.7 4.3 4.3  CL 101 101 100 104 103  CO2 21* 21* 18* 18* 21*  GLUCOSE 165* 227* 251* 170* 121*  BUN 24* 27* 34* 34* 21*  CREATININE 0.87 1.10* 1.20* 1.14* 0.60  CALCIUM 9.1 9.1 8.7* 8.6* 8.8*   GFR Estimated Creatinine Clearance: 105.4 mL/min (by C-G formula based on SCr of 0.6 mg/dL). Liver Function Tests: Recent Labs  Lab 12/17/20 0124 12/19/20 0055  AST 27 28  ALT 32 36  ALKPHOS 139* 101  BILITOT 0.1* 0.4  PROT 7.8 6.6  ALBUMIN 3.2* 2.4*   No results for input(s): LIPASE, AMYLASE in the last 168 hours. No results for input(s): AMMONIA in the last 168 hours. Coagulation profile No results for input(s): INR, PROTIME in the last 168 hours. COVID-19 Labs  No results for input(s): DDIMER, FERRITIN, LDH, CRP in the last 72 hours.  Lab Results  Component Value Date   SARSCOV2NAA POSITIVE (A) 12/15/2020   SARSCOV2NAA POSITIVE (A) 12/01/2020    CBC: Recent Labs  Lab 12/16/20 0208 12/17/20 0124 12/18/20 0131 12/19/20 0055 12/21/20 0611  WBC 12.8* 20.4* 25.7* 21.0* 16.5*  HGB 12.7 12.8 11.1* 10.0* 10.2*  HCT 40.8 40.9 34.8* 31.9* 32.2*  MCV 71.3* 72.0* 71.6* 71.7* 69.8*  PLT 333 375 314 329 390   Cardiac Enzymes: No results for input(s): CKTOTAL, CKMB, CKMBINDEX, TROPONINI in the last 168 hours. BNP (last 3 results) No results for input(s): PROBNP in the last 8760 hours. CBG: Recent Labs  Lab 12/21/20 1150 12/21/20 1553 12/21/20 2005 12/21/20 2349  12/22/20 0514  GLUCAP 182* 173* 191* 182* 145*   D-Dimer: No results for input(s): DDIMER in the last 72 hours. Hgb A1c: No results for input(s): HGBA1C in the last 72 hours. Lipid Profile: No results for input(s): CHOL, HDL, LDLCALC, TRIG, CHOLHDL, LDLDIRECT in the last 72 hours. Thyroid function studies: No results for input(s): TSH, T4TOTAL, T3FREE, THYROIDAB in the last 72 hours.  Invalid input(s): FREET3 Anemia work up: No results for input(s): VITAMINB12, FOLATE, FERRITIN, TIBC, IRON, RETICCTPCT in the last 72 hours. Sepsis Labs: Recent Labs  Lab 12/17/20 0124 12/18/20 0131 12/18/20 0726 12/18/20 1154 12/19/20 0055 12/21/20 0611  PROCALCITON  --   --  3.77 0.27 0.43  --   WBC 20.4* 25.7*  --   --  21.0* 16.5*   Microbiology Recent Results (  from the past 240 hour(s))  SARS CORONAVIRUS 2 (TAT 6-24 HRS) Nasopharyngeal Nasopharyngeal Swab     Status: Abnormal   Collection Time: 12/15/20  2:05 PM   Specimen: Nasopharyngeal Swab  Result Value Ref Range Status   SARS Coronavirus 2 POSITIVE (A) NEGATIVE Final    Comment: (NOTE) SARS-CoV-2 target nucleic acids are DETECTED.  The SARS-CoV-2 RNA is generally detectable in upper and lower respiratory specimens during the acute phase of infection. Positive results are indicative of the presence of SARS-CoV-2 RNA. Clinical correlation with patient history and other diagnostic information is  necessary to determine patient infection status. Positive results do not rule out bacterial infection or co-infection with other viruses.  The expected result is Negative.  Fact Sheet for Patients: SugarRoll.be  Fact Sheet for Healthcare Providers: https://www.woods-mathews.com/  This test is not yet approved or cleared by the [place] FDA and  has been authorized for detection and/or diagnosis of SARS-CoV-2 by FDA under an Emergency Use Authorization (EUA). This EUA will remain  in  effect (meaning this test can be used) for the duration of the COVID-19 declaration under Section 564(b)(1) of the Act, 21 U. S.C. section 360bbb-3(b)(1), unless the authorization is terminated or revoked sooner.   Performed at [facility], [place] [address] [place], [place] [address]   Urine Culture     Status: None   Collection Time: 12/17/20  8:49 AM   Specimen: In/Out Cath Urine  Result Value Ref Range Status   Specimen Description IN/OUT CATH URINE  Final   Special Requests NONE  Final   Culture   Final    NO GROWTH Performed at [facility], [place] [address], [place], [place] [address]    Report Status 12/18/2020 FINAL  Final  Culture, blood (routine x 2)     Status: None (Preliminary result)   Collection Time: 12/18/20  7:26 AM   Specimen: BLOOD  Result Value Ref Range Status   Specimen Description BLOOD SITE NOT SPECIFIED  Final   Special Requests AEROBIC BOTTLE ONLY Blood Culture adequate volume  Final   Culture   Final    NO GROWTH 3 DAYS Performed at [facility], [place] [address] [place], [place] [address]    Report Status PENDING  Incomplete  Culture, blood (routine x 2)     Status: None (Preliminary result)   Collection Time: 12/18/20  7:26 AM   Specimen: BLOOD  Result Value Ref Range Status   Specimen Description BLOOD SITE NOT SPECIFIED  Final   Special Requests   Final    AEROBIC BOTTLE ONLY Blood Culture results may not be optimal due to an inadequate volume of blood received in culture bottles   Culture   Final    NO GROWTH 3 DAYS Performed at [facility], [place] [address], [place], [place] [address]    Report Status PENDING  Incomplete     Medications:    acetaminophen (TYLENOL) oral liquid 160 mg/5 mL  650 mg Per Tube Q6H   amiodarone  200 mg Per Tube BID   apixaban  2.5 mg Per Tube BID   carvedilol  6.25 mg Per Tube BID WC   cephALEXin  500 mg Oral Q6H   feeding supplement (OSMOLITE 1.5 CAL)  237 mL Per Tube 5 X Daily    feeding supplement (PROSource TF)  45 mL Per Tube TID   free water  200 mL Per Tube 5 X Daily  insulin aspart  0-20 Units Subcutaneous Q4H   insulin glargine-yfgn  25 Units Subcutaneous BID   LORazepam  2 mg Intramuscular Once   losartan  25 mg Per Tube Daily   pantoprazole sodium  40 mg Per Tube Daily   Continuous Infusions:    LOS: 21 days   Charlynne Cousins  [facility]  12/22/2020, 7:19 AM

## 2020-12-22 NOTE — Progress Notes (Signed)
Palliative Medicine RN Note: Our team rec'd call from sister [name].   She asked why her sister is on a 2 person per day visitation; I explained that is current hospital policy and that she can view the policy on the hospital website. I also let her know that the only way that changes is if a pt is made strict comfort care.   Marjie Skiff Markeshia Giebel, RN, BSN, [facility] Palliative Medicine Team 12/22/2020 2:32 PM Office [phone]

## 2020-12-22 NOTE — Progress Notes (Signed)
Spoke with Heather in IR re: eval of G tube placement. IR made aware that G tube securing device is approx  1 1/2 - 2 " out from ostomy and is leaking. Attempted to instill 30 ml sterile H2O with ativan. Majority of H2O ran out of ostomy onto abdomen. Skin cleansed and dressing reapplied. Tube covered with ABD pad.

## 2020-12-22 NOTE — Progress Notes (Signed)
Notified by RN that pt very agitated and fighting staff and hitting. Given ativan but still combative.  Given one dose of haldol IM now

## 2020-12-22 NOTE — TOC Progression Note (Signed)
Transition of Care [facility]) - Progression Note    Patient Details  Name: Courtney Davies MRN: [id number] Date of Birth: [birth year]  Transition of [facility]) CM/SW [place], [place] Phone Number: 12/22/2020, 2:50 PM  Clinical Narrative:     Patient has SNF bed at [facility] when medically ready. Insurance authorization has been approved. CSW will continue to follow and assist with dc planning needs.  Expected Discharge Plan: [place] Barriers to Discharge: Continued Medical Work up  Expected Discharge Plan and Services Expected Discharge Plan: [place] arrangements for the past 2 months: Single Family Home                                       Social Determinants of Health (SDOH) Interventions    Readmission Risk Interventions No flowsheet data found.

## 2020-12-22 NOTE — Progress Notes (Addendum)
Pt continues to set off bed alarm wanting to get up and leave room. Pt continues is also pulling at PEG tube. Security called to help assist with pt so patient can be placed in restraints. Order in place for restraints. Pt bed alarm also placed back on pt. Will continue to monitor patient behavior.

## 2020-12-22 NOTE — Progress Notes (Signed)
Pt is call and appears to be sleeping. Will continue to reassess need for restraints.

## 2020-12-23 DIAGNOSIS — I469 Cardiac arrest, cause unspecified: Secondary | ICD-10-CM | POA: Diagnosis not present

## 2020-12-23 DIAGNOSIS — L03311 Cellulitis of abdominal wall: Secondary | ICD-10-CM | POA: Diagnosis not present

## 2020-12-23 DIAGNOSIS — G9340 Encephalopathy, unspecified: Secondary | ICD-10-CM | POA: Diagnosis not present

## 2020-12-23 DIAGNOSIS — I5021 Acute systolic (congestive) heart failure: Secondary | ICD-10-CM | POA: Diagnosis not present

## 2020-12-23 LAB — CULTURE, BLOOD (ROUTINE X 2)
Culture: NO GROWTH
Culture: NO GROWTH
Special Requests: ADEQUATE

## 2020-12-23 LAB — GLUCOSE, CAPILLARY
Glucose-Capillary: 161 mg/dL — ABNORMAL HIGH (ref 70–99)
Glucose-Capillary: 178 mg/dL — ABNORMAL HIGH (ref 70–99)
Glucose-Capillary: 126 mg/dL — ABNORMAL HIGH (ref 70–99)
Glucose-Capillary: 159 mg/dL — ABNORMAL HIGH (ref 70–99)
Glucose-Capillary: 129 mg/dL — ABNORMAL HIGH (ref 70–99)

## 2020-12-23 MED ORDER — HALOPERIDOL LACTATE 5 MG/ML IJ SOLN
5.0000 mg | Freq: Four times a day (QID) | INTRAMUSCULAR | Status: DC | PRN
  Administered 2020-12-25 (×2): 5 mg via INTRAMUSCULAR
  Filled 2020-12-23: qty 1

## 2020-12-23 MED ORDER — SODIUM CHLORIDE 0.9% FLUSH: 10.0000 mL | INTRAVENOUS | Status: DC | PRN

## 2020-12-23 MED ORDER — ZIPRASIDONE MESYLATE 20 MG IM SOLR
10.0000 mg | Freq: Once | INTRAMUSCULAR | Status: AC
  Administered 2020-12-23: 10 mg via INTRAMUSCULAR
  Filled 2020-12-23: qty 20

## 2020-12-23 MED ORDER — SODIUM CHLORIDE 0.9% FLUSH
10.0000 mL | Freq: Two times a day (BID) | INTRAVENOUS | Status: DC
Start: 2020-12-23 — End: 2020-12-31
  Administered 2020-12-23 – 2020-12-30 (×10): 10 mL

## 2020-12-23 NOTE — Progress Notes (Signed)
Pt received bath and linens change. Pt tolerated fairly well. Pt also given grape juice as requested.

## 2020-12-23 NOTE — Progress Notes (Signed)
Pt is more calm slightly agitated. Pt in bed not attempting to get out of bed at this time. Restraints removed from pt. Pt given IM Haldol 5 mg as ordered due to be being agitated. Will continue to monitor pt behavior.

## 2020-12-23 NOTE — Consult Note (Signed)
[facility] Nurse Consult Note: Patient receiving care in [place]. Sitter in room. Reason for Consult: abdominal wall cellulits Wound type: PEG tube insertion site with heavy cream colored drainage. The tube appears approximately 2/3 out of the hole it had been inserted into. Primary RN, Tess, reports the PEG will be removed today. Pressure Injury POA: Yes/No/NA Measurement: Wound bed: Drainage (amount, consistency, odor)  Periwound: Dressing procedure/placement/frequency: Cleanse the abdomen of the PEG tube drainage with soap and water. Place a foam dressing over it. Perform at least twice daily. Can place a folded gauze over the PEG opening if needed to assist with drainage collection. Thank you for the consult.  Discussed plan of care with the patient and bedside nurse.  [place] nurse will not follow at this time.  Please re-consult the [place] team if needed.  Val Riles, RN, MSN, CWOCN, CNS-BC, pager [phone]

## 2020-12-23 NOTE — Plan of Care (Signed)
  Problem: Clinical Measurements: Goal: Ability to maintain clinical measurements within normal limits will improve Outcome: Progressing   Problem: Clinical Measurements: Goal: Diagnostic test results will improve Outcome: Progressing   Problem: Clinical Measurements: Goal: Respiratory complications will improve Outcome: Progressing   Problem: Clinical Measurements: Goal: Cardiovascular complication will be avoided Outcome: Progressing   Problem: Nutrition: Goal: Adequate nutrition will be maintained Outcome: Progressing   Problem: Coping: Goal: Level of anxiety will decrease Outcome: Progressing   Problem: Pain Managment: Goal: General experience of comfort will improve Outcome: Progressing   Problem: Elimination: Goal: Will not experience complications related to bowel motility Outcome: Progressing   Problem: Elimination: Goal: Will not experience complications related to urinary retention Outcome: Progressing   Problem: Skin Integrity: Goal: Risk for impaired skin integrity will decrease Outcome: Progressing   Problem: Activity: Goal: Ability to tolerate increased activity will improve Outcome: Progressing   Problem: Respiratory: Goal: Ability to maintain a clear airway and adequate ventilation will improve Outcome: Progressing

## 2020-12-23 NOTE — Progress Notes (Signed)
[facility] PROGRESS NOTE    Progress Note  [name]  TLX:[id number] DOB: [birth year] DOA: 12/01/2020 PCP: Pcp, No     Brief Narrative:   Courtney Davies is an 45 y.o. female past medical history significant for essential hypertension, vitamin B12 deficiency obstructive sleep apnea admitted to the ICU on 12/01/2020 for A. fib arrest, subsequently patient was extubated on 12/06/2020, there is a concern of anoxic brain injury, CTA of the chest was negative for PE.  2D echo was done that showed mild cardiomyopathy seen by cardiology and recommended further work-up once neurological status is improved.  Transfer out of the ICU and noted to have some dysphagia and cognitive deficit core track was placed started on nutrition dysphagia is predominantly cognitive in nature. Seen by palliative care, and PEG tube placed on 12/13/2020.  Patient continues to play and pull on the PEG tube, IR has been consulted on 12/23/2020 to remove PEG tube. Wound care has been consulted And will probably need skilled nursing facility. On 12/18/2020 he developed fever with a severe leukocytosis and tachycardia subsequently noted to have abdominal wall cellulitis around PEG tube    Assessment/Plan:   V. fib arrest with acute respiratory failure with hypoxia and acute encephalopathy: Cardiology deemed not appropriate for ischemic work-up until neurological status improved. Continue amiodarone and Coreg. Discharge planning [facility] to skilled nursing facility.  Abdominal wall cellulitis around PEG tube, Noticed on 12/19/2020 started on IV Rocephin cultures have been negative so far. Infectious disease consulted who recommended to change to Keflex.  Which we will continue for 2 weeks. Has remained afebrile no leukocytosis IR has been reconsulted to remove PEG tube. Wound care has been consulted for new abdominal wall wound.  Acute systolic heart failure: In the setting of V. fib arrest a 2D echo was done that  showed an EF of 40% with global hypokinesia. Continue Coreg and losartan.  Paroxysmal atrial fibrillation: Rate controlled on amiodarone and Coreg.  Transient episode cardiology recommended against long-term anticoagulation.  Thyroid nodule: Follow-up with PCP as an outpatient.  Type 2 diabetes mellitus: With an A1c of 6.9 continue long-acting insulin plus sliding scale.  Aspiration pneumonia: Resolved.  Ambulatory dysfunction/deconditioning: Physical therapy was consulted recommended skilled nursing facility.  Anoxic brain injury with severe agitation and behavioral disturbances: Patient came out of her room naked screaming yelling with foul language. She had to be given Haldol and Geodon now has been calm down. Has been placed in restraints.  Nutrition tube feedings: The patient continues to try to pull the PEG tube out, she has dislodged it, fortunately she is not bleeding. IR has been consulted for removal of PEG tube this was discussed with the family.  DVT prophylaxis: lovenox Family Communication:mother Status is: Inpatient  Remains inpatient appropriate because:Hemodynamically unstable  Dispo: The patient is from: Home              Anticipated d/c is to: SNF              Patient currently is not medically stable to d/c.   Difficult to place patient No    Code Status:     Code Status Orders  (From admission, onward)           Start     Ordered   12/01/20 1304  Full code  Continuous        12/01/20 1305           Code Status History  This patient has a current code status but no historical code status.         IV Access:   Peripheral IV   Procedures and diagnostic studies:   No results found.   Medical Consultants:   None.   Subjective:    Courtney Davies nonverbal Sleepy  Objective:    Vitals:   12/21/20 2006 12/22/20 0500 12/22/20 1359 12/22/20 2126  BP: 132/78 131/75 120/72 126/90  Pulse: 88 82 79   Resp: 18 18  19    Temp: 97.9 F (36.6 C) 98.4 F (36.9 C) 97.8 F (36.6 C)   TempSrc: Oral Axillary Axillary   SpO2:  100%    Weight:  97.1 kg    Height:       SpO2: 100 % O2 Flow Rate (L/min): 2 L/min FiO2 (%): 29 %   Intake/Output Summary (Last 24 hours) at 12/23/2020 0947 Last data filed at 12/23/2020 0226 Gross per 24 hour  Intake 120 ml  Output --  Net 120 ml   Filed Weights   12/20/20 0555 12/21/20 0314 12/22/20 0500  Weight: 100.9 kg 100.7 kg 97.1 kg    Exam: General exam: In no acute distress. Respiratory system: Good air movement and clear to auscultation. Cardiovascular system: S1 & S2 heard, RRR. No JVD. Gastrointestinal system: Abdomen is nondistended, soft and nontender.  Erythema around PEG tube, PEG tube is loose. Extremities: No pedal edema. Skin: No rashes, lesions or ulcers   Data Reviewed:    Labs: Basic Metabolic Panel: Recent Labs  Lab 12/17/20 0124 12/18/20 0131 12/19/20 0055 12/21/20 0611  NA 131* 129* 131* 133*  K 4.7 4.7 4.3 4.3  CL 101 100 104 103  CO2 21* 18* 18* 21*  GLUCOSE 227* 251* 170* 121*  BUN 27* 34* 34* 21*  CREATININE 1.10* 1.20* 1.14* 0.60  CALCIUM 9.1 8.7* 8.6* 8.8*    GFR Estimated Creatinine Clearance: 105.4 mL/min (by C-G formula based on SCr of 0.6 mg/dL). Liver Function Tests: Recent Labs  Lab 12/17/20 0124 12/19/20 0055  AST 27 28  ALT 32 36  ALKPHOS 139* 101  BILITOT 0.1* 0.4  PROT 7.8 6.6  ALBUMIN 3.2* 2.4*    No results for input(s): LIPASE, AMYLASE in the last 168 hours. No results for input(s): AMMONIA in the last 168 hours. Coagulation profile No results for input(s): INR, PROTIME in the last 168 hours. COVID-19 Labs  No results for input(s): DDIMER, FERRITIN, LDH, CRP in the last 72 hours.  Lab Results  Component Value Date   SARSCOV2NAA POSITIVE (A) 12/15/2020   SARSCOV2NAA POSITIVE (A) 12/01/2020    CBC: Recent Labs  Lab 12/17/20 0124 12/18/20 0131 12/19/20 0055 12/21/20 0611  WBC 20.4*  25.7* 21.0* 16.5*  HGB 12.8 11.1* 10.0* 10.2*  HCT 40.9 34.8* 31.9* 32.2*  MCV 72.0* 71.6* 71.7* 69.8*  PLT 375 314 329 390    Cardiac Enzymes: No results for input(s): CKTOTAL, CKMB, CKMBINDEX, TROPONINI in the last 168 hours. BNP (last 3 results) No results for input(s): PROBNP in the last 8760 hours. CBG: Recent Labs  Lab 12/22/20 1149 12/22/20 1650 12/22/20 2122 12/23/20 0139 12/23/20 0512  GLUCAP 160* 200* 155* 161* 178*    D-Dimer: No results for input(s): DDIMER in the last 72 hours. Hgb A1c: No results for input(s): HGBA1C in the last 72 hours. Lipid Profile: No results for input(s): CHOL, HDL, LDLCALC, TRIG, CHOLHDL, LDLDIRECT in the last 72 hours. Thyroid function studies: No results for input(s): TSH, T4TOTAL,  T3FREE, THYROIDAB in the last 72 hours.  Invalid input(s): FREET3 Anemia work up: No results for input(s): VITAMINB12, FOLATE, FERRITIN, TIBC, IRON, RETICCTPCT in the last 72 hours. Sepsis Labs: Recent Labs  Lab 12/17/20 0124 12/18/20 0131 12/18/20 0726 12/18/20 1154 12/19/20 0055 12/21/20 0611  PROCALCITON  --   --  3.77 0.27 0.43  --   WBC 20.4* 25.7*  --   --  21.0* 16.5*    Microbiology Recent Results (from the past 240 hour(s))  SARS CORONAVIRUS 2 (TAT 6-24 HRS) Nasopharyngeal Nasopharyngeal Swab     Status: Abnormal   Collection Time: 12/15/20  2:05 PM   Specimen: Nasopharyngeal Swab  Result Value Ref Range Status   SARS Coronavirus 2 POSITIVE (A) NEGATIVE Final    Comment: (NOTE) SARS-CoV-2 target nucleic acids are DETECTED.  The SARS-CoV-2 RNA is generally detectable in upper and lower respiratory specimens during the acute phase of infection. Positive results are indicative of the presence of SARS-CoV-2 RNA. Clinical correlation with patient history and other diagnostic information is  necessary to determine patient infection status. Positive results do not rule out bacterial infection or co-infection with other viruses.  The  expected result is Negative.  Fact Sheet for Patients: SugarRoll.be  Fact Sheet for Healthcare Providers: https://www.woods-mathews.com/  This test is not yet approved or cleared by the [place] FDA and  has been authorized for detection and/or diagnosis of SARS-CoV-2 by FDA under an Emergency Use Authorization (EUA). This EUA will remain  in effect (meaning this test can be used) for the duration of the COVID-19 declaration under Section 564(b)(1) of the Act, 21 U. S.C. section 360bbb-3(b)(1), unless the authorization is terminated or revoked sooner.   Performed at [facility], [place] [address] [place], [place] [address]   Urine Culture     Status: None   Collection Time: 12/17/20  8:49 AM   Specimen: In/Out Cath Urine  Result Value Ref Range Status   Specimen Description IN/OUT CATH URINE  Final   Special Requests NONE  Final   Culture   Final    NO GROWTH Performed at [facility], [place] [address] [place], [place] [address]    Report Status 12/18/2020 FINAL  Final  Culture, blood (routine x 2)     Status: None (Preliminary result)   Collection Time: 12/18/20  7:26 AM   Specimen: BLOOD  Result Value Ref Range Status   Specimen Description BLOOD SITE NOT SPECIFIED  Final   Special Requests AEROBIC BOTTLE ONLY Blood Culture adequate volume  Final   Culture   Final    NO GROWTH 4 DAYS Performed at [facility], [place] [address] [place], [place] [address]    Report Status PENDING  Incomplete  Culture, blood (routine x 2)     Status: None (Preliminary result)   Collection Time: 12/18/20  7:26 AM   Specimen: BLOOD  Result Value Ref Range Status   Specimen Description BLOOD SITE NOT SPECIFIED  Final   Special Requests   Final    AEROBIC BOTTLE ONLY Blood Culture results may not be optimal due to an inadequate volume of blood received in culture bottles   Culture   Final    NO GROWTH 4 DAYS Performed at [facility], [place] [address], [place],  [address]    Report Status PENDING  Incomplete     Medications:    acetaminophen (TYLENOL) oral liquid 160 mg/5 mL  650 mg Per  Tube Q6H   amiodarone  200 mg Per Tube BID   carvedilol  6.25 mg Per Tube BID WC   cephALEXin  500 mg Oral Q6H   enoxaparin (LOVENOX) injection  40 mg Subcutaneous Q24H   feeding supplement (OSMOLITE 1.5 CAL)  237 mL Per Tube 5 X Daily   feeding supplement (PROSource TF)  45 mL Per Tube TID   free water  200 mL Per Tube 5 X Daily   insulin aspart  0-20 Units Subcutaneous Q4H   insulin glargine-yfgn  25 Units Subcutaneous BID   LORazepam  2 mg Intramuscular Once   losartan  25 mg Per Tube Daily   nicotine  21 mg Transdermal Daily   pantoprazole sodium  40 mg Per Tube Daily   Continuous Infusions:    LOS: 22 days   Charlynne Cousins  [facility]  12/23/2020, 9:47 AM

## 2020-12-24 DIAGNOSIS — I5021 Acute systolic (congestive) heart failure: Secondary | ICD-10-CM | POA: Diagnosis not present

## 2020-12-24 DIAGNOSIS — L03311 Cellulitis of abdominal wall: Secondary | ICD-10-CM | POA: Diagnosis not present

## 2020-12-24 DIAGNOSIS — G9340 Encephalopathy, unspecified: Secondary | ICD-10-CM | POA: Diagnosis not present

## 2020-12-24 DIAGNOSIS — I469 Cardiac arrest, cause unspecified: Secondary | ICD-10-CM | POA: Diagnosis not present

## 2020-12-24 DIAGNOSIS — R451 Restlessness and agitation: Secondary | ICD-10-CM

## 2020-12-24 DIAGNOSIS — R509 Fever, unspecified: Secondary | ICD-10-CM | POA: Diagnosis not present

## 2020-12-24 LAB — GLUCOSE, CAPILLARY
Glucose-Capillary: 124 mg/dL — ABNORMAL HIGH (ref 70–99)
Glucose-Capillary: 162 mg/dL — ABNORMAL HIGH (ref 70–99)
Glucose-Capillary: 191 mg/dL — ABNORMAL HIGH (ref 70–99)
Glucose-Capillary: 203 mg/dL — ABNORMAL HIGH (ref 70–99)
Glucose-Capillary: 257 mg/dL — ABNORMAL HIGH (ref 70–99)
Glucose-Capillary: 342 mg/dL — ABNORMAL HIGH (ref 70–99)
Glucose-Capillary: 98 mg/dL (ref 70–99)

## 2020-12-24 MED ORDER — DIVALPROEX SODIUM 250 MG PO DR TAB: 500.0000 mg | DELAYED_RELEASE_TABLET | Freq: Two times a day (BID) | ORAL | Status: DC

## 2020-12-24 MED ORDER — LORAZEPAM 2 MG/ML IJ SOLN
1.0000 mg | INTRAMUSCULAR | Status: DC | PRN
  Administered 2020-12-24 – 2021-01-01 (×13): 1 mg via INTRAVENOUS
  Filled 2020-12-24 (×13): qty 1

## 2020-12-24 MED ORDER — VALPROIC ACID 250 MG/5ML PO SOLN
250.0000 mg | Freq: Four times a day (QID) | ORAL | Status: DC
  Administered 2020-12-24 – 2020-12-25 (×3): 250 mg
  Filled 2020-12-24 (×5): qty 5

## 2020-12-24 NOTE — Progress Notes (Signed)
[facility] PROGRESS NOTE    Progress Note  Courtney Davies  FTD:[id number] DOB: [birth year] DOA: 12/01/2020 PCP: Pcp, No     Brief Narrative:   Courtney Davies is an 45 y.o. female past medical history significant for essential hypertension, vitamin B12 deficiency obstructive sleep apnea admitted to the ICU on 12/01/2020 for A. fib arrest, subsequently patient was extubated on 12/06/2020, there is a concern of anoxic brain injury, CTA of the chest was negative for PE.  2D echo was done that showed mild cardiomyopathy seen by cardiology and recommended further work-up once neurological status is improved.  Transfer out of the ICU and noted to have some dysphagia and cognitive deficit core track was placed started on nutrition dysphagia is predominantly cognitive in nature. Seen by palliative care, and PEG tube placed on 12/13/2020.  Patient continues to play and pull on the PEG tube, IR has been consulted on 12/23/2020 to remove PEG tube. Wound care has been consulted And will probably need skilled nursing facility. On 12/18/2020 he developed fever with a severe leukocytosis and tachycardia subsequently noted to have abdominal wall cellulitis around PEG tube    Assessment/Plan:   V. fib arrest with acute respiratory failure with hypoxia and acute encephalopathy: Cardiology deemed not appropriate for ischemic work-up until neurological status improved. Continue amiodarone and Coreg. Discharge planning [facility] to skilled nursing facility.  Abdominal wall cellulitis around PEG tube, Noticed on 12/19/2020 started on IV Rocephin cultures have been negative so far. Infectious disease consulted who recommended to change to Keflex.  Which we will continue for 2 weeks. IR relates that it if it comes out we will be able to put it back in. Courtney Davies wrap applied around her abdomen.  She will have 2 to 48 hours as she is able to tolerate her diet.  If not will resume enteric feedings. Wound care has been  consulted for new abdominal wall wound.  Acute systolic heart failure: In the setting of V. fib arrest a 2D echo was done that showed an EF of 40% with global hypokinesia. Continue Coreg and losartan.  Paroxysmal atrial fibrillation: Rate controlled on amiodarone and Coreg.  Transient episode cardiology recommended against long-term anticoagulation.  Thyroid nodule: Follow-up with PCP as an outpatient.  Type 2 diabetes mellitus: With an A1c of 6.9 continue long-acting insulin plus sliding scale.  Aspiration pneumonia: Resolved.  Ambulatory dysfunction/deconditioning: Physical therapy was consulted recommended skilled nursing facility.  Anoxic brain injury with severe agitation and behavioral disturbances: Patient came out of her room naked screaming yelling with foul language. She was given Haldol and now has stabilized. She is currently in restraints. Will consult psychiatry for further evaluation.  Nutrition tube feedings: To having a long discussion with IR they relate that if the G-tube comes out cannot be put back in. Will try to encourage the patient to try to tolerate the food. Was a family try to bring her food from home as she seems that she eats the food from her house and all from the hospital.  She will have to prove that she is eating for about 48 hours before thinking about removing PEG tube.  DVT prophylaxis: lovenox Family Communication:mother Status is: Inpatient  Remains inpatient appropriate because:Hemodynamically unstable  Dispo: The patient is from: Home              Anticipated d/c is to: SNF              Patient currently is not medically  stable to d/c.   Difficult to place patient No    Code Status:     Code Status Orders  (From admission, onward)           Start     Ordered   12/01/20 1304  Full code  Continuous        12/01/20 1305           Code Status History     This patient has a current code status but no historical  code status.         IV Access:   Peripheral IV   Procedures and diagnostic studies:   No results found.   Medical Consultants:   None.   Subjective:    Courtney Davies awake today  Objective:    Vitals:   12/23/20 2021 12/24/20 0041 12/24/20 0400 12/24/20 0423  BP: 128/69 121/66 135/66   Pulse:  81 82   Resp: 20 17 18    Temp: 98.3 F (36.8 C) 98.1 F (36.7 C) 97.9 F (36.6 C)   TempSrc: Axillary Axillary Axillary   SpO2: 99% 100% 100%   Weight:    94.8 kg  Height:       SpO2: 100 % O2 Flow Rate (L/min): 2 L/min FiO2 (%): 29 %   Intake/Output Summary (Last 24 hours) at 12/24/2020 0952 Last data filed at 12/23/2020 2120 Gross per 24 hour  Intake 240 ml  Output --  Net 240 ml    Filed Weights   12/21/20 0314 12/22/20 0500 12/24/20 0423  Weight: 100.7 kg 97.1 kg 94.8 kg    Exam: General exam: In no acute distress. Respiratory system: Good air movement and clear to auscultation. Cardiovascular system: S1 & S2 heard, RRR. No JVD. Gastrointestinal system: Abdomen is nondistended, soft and nontender.  Extremities: No pedal edema. Skin: No rashes, lesions or ulcers   Data Reviewed:    Labs: Basic Metabolic Panel: Recent Labs  Lab 12/18/20 0131 12/19/20 0055 12/21/20 0611  NA 129* 131* 133*  K 4.7 4.3 4.3  CL 100 104 103  CO2 18* 18* 21*  GLUCOSE 251* 170* 121*  BUN 34* 34* 21*  CREATININE 1.20* 1.14* 0.60  CALCIUM 8.7* 8.6* 8.8*    GFR Estimated Creatinine Clearance: 104.1 mL/min (by C-G formula based on SCr of 0.6 mg/dL). Liver Function Tests: Recent Labs  Lab 12/19/20 0055  AST 28  ALT 36  ALKPHOS 101  BILITOT 0.4  PROT 6.6  ALBUMIN 2.4*    No results for input(s): LIPASE, AMYLASE in the last 168 hours. No results for input(s): AMMONIA in the last 168 hours. Coagulation profile No results for input(s): INR, PROTIME in the last 168 hours. COVID-19 Labs  No results for input(s): DDIMER, FERRITIN, LDH, CRP in the last  72 hours.  Lab Results  Component Value Date   SARSCOV2NAA POSITIVE (A) 12/15/2020   SARSCOV2NAA POSITIVE (A) 12/01/2020    CBC: Recent Labs  Lab 12/18/20 0131 12/19/20 0055 12/21/20 0611  WBC 25.7* 21.0* 16.5*  HGB 11.1* 10.0* 10.2*  HCT 34.8* 31.9* 32.2*  MCV 71.6* 71.7* 69.8*  PLT 314 329 390    Cardiac Enzymes: No results for input(s): CKTOTAL, CKMB, CKMBINDEX, TROPONINI in the last 168 hours. BNP (last 3 results) No results for input(s): PROBNP in the last 8760 hours. CBG: Recent Labs  Lab 12/23/20 1601 12/23/20 2017 12/24/20 0004 12/24/20 0413 12/24/20 0738  GLUCAP 159* 129* 191* 162* 203*    D-Dimer: No results for  input(s): DDIMER in the last 72 hours. Hgb A1c: No results for input(s): HGBA1C in the last 72 hours. Lipid Profile: No results for input(s): CHOL, HDL, LDLCALC, TRIG, CHOLHDL, LDLDIRECT in the last 72 hours. Thyroid function studies: No results for input(s): TSH, T4TOTAL, T3FREE, THYROIDAB in the last 72 hours.  Invalid input(s): FREET3 Anemia work up: No results for input(s): VITAMINB12, FOLATE, FERRITIN, TIBC, IRON, RETICCTPCT in the last 72 hours. Sepsis Labs: Recent Labs  Lab 12/18/20 0131 12/18/20 0726 12/18/20 1154 12/19/20 0055 12/21/20 0611  PROCALCITON  --  3.77 0.27 0.43  --   WBC 25.7*  --   --  21.0* 16.5*    Microbiology Recent Results (from the past 240 hour(s))  SARS CORONAVIRUS 2 (TAT 6-24 HRS) Nasopharyngeal Nasopharyngeal Swab     Status: Abnormal   Collection Time: 12/15/20  2:05 PM   Specimen: Nasopharyngeal Swab  Result Value Ref Range Status   SARS Coronavirus 2 POSITIVE (A) NEGATIVE Final    Comment: (NOTE) SARS-CoV-2 target nucleic acids are DETECTED.  The SARS-CoV-2 RNA is generally detectable in upper and lower respiratory specimens during the acute phase of infection. Positive results are indicative of the presence of SARS-CoV-2 RNA. Clinical correlation with patient history and other diagnostic  information is  necessary to determine patient infection status. Positive results do not rule out bacterial infection or co-infection with other viruses.  The expected result is Negative.  Fact Sheet for Patients: SugarRoll.be  Fact Sheet for Healthcare Providers: https://www.woods-mathews.com/  This test is not yet approved or cleared by the [place] FDA and  has been authorized for detection and/or diagnosis of SARS-CoV-2 by FDA under an Emergency Use Authorization (EUA). This EUA will remain  in effect (meaning this test can be used) for the duration of the COVID-19 declaration under Section 564(b)(1) of the Act, 21 U. S.C. section 360bbb-3(b)(1), unless the authorization is terminated or revoked sooner.   Performed at [facility], [place] [address], [place], [place] [address]   Urine Culture     Status: None   Collection Time: 12/17/20  8:49 AM   Specimen: In/Out Cath Urine  Result Value Ref Range Status   Specimen Description IN/OUT CATH URINE  Final   Special Requests NONE  Final   Culture   Final    NO GROWTH Performed at [facility], [place] [address], [place], [place] [address]    Report Status 12/18/2020 FINAL  Final  Culture, blood (routine x 2)     Status: None   Collection Time: 12/18/20  7:26 AM   Specimen: BLOOD  Result Value Ref Range Status   Specimen Description BLOOD SITE NOT SPECIFIED  Final   Special Requests AEROBIC BOTTLE ONLY Blood Culture adequate volume  Final   Culture   Final    NO GROWTH 5 DAYS Performed at [facility], [address], [place], [place] [address]    Report Status 12/23/2020 FINAL  Final  Culture, blood (routine x 2)     Status: None   Collection Time: 12/18/20  7:26 AM   Specimen: BLOOD  Result Value Ref Range Status   Specimen Description BLOOD SITE NOT SPECIFIED  Final   Special Requests   Final    AEROBIC BOTTLE ONLY Blood Culture results may not be optimal  due to an inadequate volume of blood received in culture bottles   Culture   Final    NO GROWTH 5 DAYS Performed at [facility], [place] [address], [place], [place] [address]    Report Status 12/23/2020 FINAL  Final     Medications:    acetaminophen (TYLENOL) oral liquid 160 mg/5 mL  650 mg Per Tube Q6H   amiodarone  200 mg Per Tube BID   carvedilol  6.25 mg Per Tube BID WC   cephALEXin  500 mg Oral Q6H   enoxaparin (LOVENOX) injection  40 mg Subcutaneous Q24H   feeding supplement (OSMOLITE 1.5 CAL)  237 mL Per Tube 5 X Daily   feeding supplement (PROSource TF)  45 mL Per Tube TID   free water  200 mL Per Tube 5 X Daily   insulin aspart  0-20 Units Subcutaneous Q4H   insulin glargine-yfgn  25 Units Subcutaneous BID   LORazepam  2 mg Intramuscular Once   losartan  25 mg Per Tube Daily   nicotine  21 mg Transdermal Daily   pantoprazole sodium  40 mg Per Tube Daily   sodium chloride flush  10-40 mL Intracatheter Q12H   Continuous Infusions:    LOS: 23 days   Charlynne Cousins  [facility]  12/24/2020, 9:52 AM

## 2020-12-24 NOTE — Progress Notes (Signed)
Patient hitting at bed and grabbing nurse arm when try to  turn.  Spit medication out.  Haldol given per prn orders for agitation.

## 2020-12-24 NOTE — Progress Notes (Signed)
Referring Physician(s): Dr Aileen Fass  Supervising Physician: Jacqulynn Cadet  Patient Status:  [facility] - In-pt  Chief Complaint:  G tube placed in IR 12/13/20   Subjective:  Infected G tube area CT 9/3: Stomach/Bowel: Percutaneous gastrostomy tube in appropriate position with the retention bumper at the level of the anterior gastric wall in the distal body of the stomach. Stranding and edema in the subcutaneous fat surrounding the region of gastrostomy tube placement may relate to a mild amount of bleeding at the time of tube placement with no evidence of focal abscess or discrete fluid collection. However, some degree cellulitis cannot be excluded.  Leakage at site is foul and pus like  Allergies: Other, Codeine, Latex, and Nitrofurantoin  Medications: Prior to Admission medications   Medication Sig Start Date End Date Taking? Authorizing Provider  folic acid (FOLVITE) 1 MG tablet Take 1 mg by mouth daily.    [provider]  HYDROcodone-acetaminophen (NORCO) 7.5-325 MG tablet Take 1 tablet by mouth 3 (three) times daily as needed for pain. 11/07/20   [provider]  LINZESS 290 MCG CAPS capsule Take 290 mcg by mouth daily. 12/01/20   [provider]  meloxicam (MOBIC) 15 MG tablet Take 15 mg by mouth daily. 06/07/20   [provider]  nirmatrelvir & ritonavir (PAXLOVID) 20 x 150 MG & 10 x 100MG TBPK Take 100-150 mg by mouth See admin instructions. follow package directions 11/10/20   [provider]  pantoprazole (PROTONIX) 40 MG tablet Take 40 mg by mouth 2 (two) times daily. 07/07/20   [provider]  phentermine (ADIPEX-P) 37.5 MG tablet Take 37.5 mg by mouth daily. 07/12/20   [provider]  PROAIR HFA 108 (90 Base) MCG/ACT inhaler Inhale 1-2 puffs into the lungs every 4 (four) hours as needed. 07/25/20   [provider]  tiZANidine (ZANAFLEX) 4 MG tablet Take 4 mg by mouth at bedtime. 09/27/20    [provider]  TRULICITY 4.5 FY/9.2KM SOPN Inject 4.5 mg into the skin once a week. 10/26/20   [provider]  Vitamin D, Ergocalciferol, (DRISDOL) 1.25 MG (50000 UNIT) CAPS capsule Take 50,000 Units by mouth once a week. 10/26/20   [provider]     Vital Signs: BP 135/66 (BP Location: Left Leg)   Pulse 82   Temp 97.9 F (36.6 C) (Axillary)   Resp 18   Ht 5' 6"  (1.676 m)   Wt 208 lb 14.4 oz (94.8 kg)   SpO2 100%   BMI 33.72 kg/m   Physical Exam Skin:    General: Skin is warm.     Comments: Site of G tube is tender Leakage at site Leakage is pus like and foul smelling Bumper is out at 5 cm Lots of gauze beneath bumper at skin site  Cleaned and cinched to 2-3 cm New dressing placed        Imaging: No results found.  Labs:  CBC: Recent Labs    12/17/20 0124 12/18/20 0131 12/19/20 0055 12/21/20 0611  WBC 20.4* 25.7* 21.0* 16.5*  HGB 12.8 11.1* 10.0* 10.2*  HCT 40.9 34.8* 31.9* 32.2*  PLT 375 314 329 390    COAGS: Recent Labs    12/13/20 0458  INR 1.1    BMP: Recent Labs    12/17/20 0124 12/18/20 0131 12/19/20 0055 12/21/20 0611  NA 131* 129* 131* 133*  K 4.7 4.7 4.3 4.3  CL 101 100 104 103  CO2 21* 18* 18*  21*  GLUCOSE 227* 251* 170* 121*  BUN 27* 34* 34* 21*  CALCIUM 9.1 8.7* 8.6* 8.8*  CREATININE 1.10* 1.20* 1.14* 0.60  GFRNONAA >60 57* >60 >60    LIVER FUNCTION TESTS: Recent Labs    12/01/20 0913 12/02/20 0450 12/17/20 0124 12/19/20 0055  BILITOT 0.5 0.8 0.1* 0.4  AST 234* 144* 27 28  ALT 284* 232* 32 36  ALKPHOS 110 94 139* 101  PROT 5.7* 5.9* 7.8 6.6  ALBUMIN 2.8* 3.0* 3.2* 2.4*    Assessment and Plan:  G tube site infection- probable New dressing placed Cinched bumper to skin Discussed with Dr Aileen Fass--- he will discuss with family---- want to pull? Want to salvage? If pull--- would not replace til fully healed-- he understands IR standpoint Rec: abd binder for  area.   Electronically Signed: Lavonia Drafts, PA-C 12/24/2020, 10:06 AM   I spent a total of 25 Minutes at the the patient's bedside AND on the patient's hospital floor or unit, greater than 50% of which was counseling/coordinating care for G tube site infection

## 2020-12-24 NOTE — Progress Notes (Signed)
Patient refused breakfast and lunch, but ate 100% of dinner with mother at bedside.  Also refused all po meds except took medications when family at bedside.

## 2020-12-24 NOTE — Consult Note (Addendum)
[place] Psychiatry Consult   Reason for Consult:  "Psychotic behavior after CPR" Referring Physician:  Dr. Aileen Fass Patient Identification: Courtney Davies MRN:  [id number] Principal Diagnosis: Fever Diagnosis:  Principal Problem:   Fever Active Problems:   Cardiac arrest ([place])   Type 1 diabetes mellitus without complication ([place])   Hypertension   GERD (gastroesophageal reflux disease)   Anemia   Encephalopathy acute   Acute systolic heart failure (HCC)   Dysphagia   Goals of care, counseling/discussion   Abdominal wall cellulitis   Total Time spent with patient: 20 minutes  Subjective:   Courtney Davies is a 45 y.o. female patient admitted with v fib arrest, patient has had a complicated hospital course and was found to have anoxic brain injury. Psychiatry was consulted for apparent psychotic behavior.  On chart review, patient has a chart that is marked for merge-patient does not appear to have any psychiatric history and has not had a history of psychosis in the past. Over the past couple days patient has been noted to be agitated and has received a total of 15 mg haldol and 6 mg ativan in the last 24 hours. MRI of brain performed 8/21 revealed "Areas of bilateral cerebral cortical reduced diffusion. Reduced diffusion along the hippocampi. This is consistent with hypoxic/ischemic injury".  On interview patient is found laying in bed in restraints. She is aware that she is in in [place]. She states that the month is December and does not provide an answer when asked what year it is. She is unable to provide any information about what brought her into the hospital. Throughout assessment she appears to have difficulty with word finding and expressing herself.  She repeatedly states that she wants her phone and would like to go home and that her family "know how to help me". When asked what she needs assistance with she is unable/unwilling to provide an answer. She denies  SI/HI/AVH. Marland Kitchen She denies psychiatric hx or being on medications in the past. She is a poor historian, although lack of psychiatric history is c/w what is garnered on chart review.  Marland Kitchen   HPI:   Courtney Davies is an 45 y.o. female past medical history significant for essential hypertension, vitamin B12 deficiency obstructive sleep apnea admitted to the ICU on 12/01/2020 for A. fib arrest, subsequently patient was extubated on 12/06/2020, there is a concern of anoxic brain injury, CTA of the chest was negative for PE.  2D echo was done that showed mild cardiomyopathy seen by cardiology and recommended further work-up once neurological status is improved.  Transfer out of the ICU and noted to have some dysphagia and cognitive deficit core track was placed started on nutrition dysphagia is predominantly cognitive in nature. Seen by palliative care, and PEG tube placed on 12/13/2020.  Patient continues to play and pull on the PEG tube, IR has been consulted on 12/23/2020 to remove PEG tube. Wound care has been consulted And will probably need skilled nursing facility. On 12/18/2020 he developed fever with a severe leukocytosis and tachycardia subsequently noted to have abdominal wall cellulitis around PEG tube  Past Psychiatric History: denied, per chart review there is no previous psychiatric history  Risk to Self:  no Risk to Others:  no Prior Inpatient Therapy:  unknown Prior Outpatient Therapy:  unknown  Past Medical History:  Past Medical History:  Diagnosis Date   Anemia    Diabetes type 2, controlled ([place])    GERD (gastroesophageal reflux  disease)    Hypertension     Past Surgical History:  Procedure Laterality Date   IR GASTROSTOMY TUBE MOD SED  12/13/2020   Family History: History reviewed. No pertinent family history. Family Psychiatric  History: unknown Social History:  Social History   Substance and Sexual Activity  Alcohol Use Not Currently     Social History   Substance and  Sexual Activity  Drug Use Never    Social History   Socioeconomic History   Marital status: Single    Spouse name: Not on file   Number of children: Not on file   Years of education: Not on file   Highest education level: Not on file  Occupational History   Not on file  Tobacco Use   Smoking status: Unknown   Smokeless tobacco: Not on file  Vaping Use   Vaping Use: Unknown  Substance and Sexual Activity   Alcohol use: Not Currently   Drug use: Never   Sexual activity: Not on file  Other Topics Concern   Not on file  Social History Narrative   Not on file   Social Determinants of Health   Financial Resource Strain: Not on file  Food Insecurity: Not on file  Transportation Needs: Not on file  Physical Activity: Not on file  Stress: Not on file  Social Connections: Not on file   Additional Social History:    Allergies:   Allergies  Allergen Reactions   Other Anaphylaxis and Hives    Hair Glue   Codeine Nausea And Vomiting   Latex Hives   Nitrofurantoin Rash    Labs:  Results for orders placed or performed during the hospital encounter of 12/01/20 (from the past 48 hour(s))  Glucose, capillary     Status: Abnormal   Collection Time: 12/22/20  4:50 PM  Result Value Ref Range   Glucose-Capillary 200 (H) 70 - 99 mg/dL    Comment: Glucose reference range applies only to samples taken after fasting for at least 8 hours.  Glucose, capillary     Status: Abnormal   Collection Time: 12/22/20  9:22 PM  Result Value Ref Range   Glucose-Capillary 155 (H) 70 - 99 mg/dL    Comment: Glucose reference range applies only to samples taken after fasting for at least 8 hours.  Glucose, capillary     Status: Abnormal   Collection Time: 12/23/20  1:39 AM  Result Value Ref Range   Glucose-Capillary 161 (H) 70 - 99 mg/dL    Comment: Glucose reference range applies only to samples taken after fasting for at least 8 hours.  Glucose, capillary     Status: Abnormal   Collection  Time: 12/23/20  5:12 AM  Result Value Ref Range   Glucose-Capillary 178 (H) 70 - 99 mg/dL    Comment: Glucose reference range applies only to samples taken after fasting for at least 8 hours.  Glucose, capillary     Status: Abnormal   Collection Time: 12/23/20 11:51 AM  Result Value Ref Range   Glucose-Capillary 126 (H) 70 - 99 mg/dL    Comment: Glucose reference range applies only to samples taken after fasting for at least 8 hours.  Glucose, capillary     Status: Abnormal   Collection Time: 12/23/20  4:01 PM  Result Value Ref Range   Glucose-Capillary 159 (H) 70 - 99 mg/dL    Comment: Glucose reference range applies only to samples taken after fasting for at least 8 hours.  Glucose, capillary  Status: Abnormal   Collection Time: 12/23/20  8:17 PM  Result Value Ref Range   Glucose-Capillary 129 (H) 70 - 99 mg/dL    Comment: Glucose reference range applies only to samples taken after fasting for at least 8 hours.  Glucose, capillary     Status: Abnormal   Collection Time: 12/24/20 12:04 AM  Result Value Ref Range   Glucose-Capillary 191 (H) 70 - 99 mg/dL    Comment: Glucose reference range applies only to samples taken after fasting for at least 8 hours.  Glucose, capillary     Status: Abnormal   Collection Time: 12/24/20  4:13 AM  Result Value Ref Range   Glucose-Capillary 162 (H) 70 - 99 mg/dL    Comment: Glucose reference range applies only to samples taken after fasting for at least 8 hours.  Glucose, capillary     Status: Abnormal   Collection Time: 12/24/20  7:38 AM  Result Value Ref Range   Glucose-Capillary 203 (H) 70 - 99 mg/dL    Comment: Glucose reference range applies only to samples taken after fasting for at least 8 hours.  Glucose, capillary     Status: Abnormal   Collection Time: 12/24/20 11:52 AM  Result Value Ref Range   Glucose-Capillary 124 (H) 70 - 99 mg/dL    Comment: Glucose reference range applies only to samples taken after fasting for at least 8  hours.    Current Facility-Administered Medications  Medication Dose Route Frequency Provider Last Rate Last Admin   acetaminophen (TYLENOL) 160 MG/5ML solution 650 mg  650 mg Per Tube Q6H Agarwala, Ravi, MD   650 mg at 12/23/20 2116   albuterol (VENTOLIN HFA) 108 (90 Base) MCG/ACT inhaler 2 puff  2 puff Inhalation Q4H PRN Shearon Stalls, Rahul P, PA-C   2 puff at 12/07/20 2004   amiodarone (PACERONE) tablet 200 mg  200 mg Per Tube BID Sherren Mocha, MD   200 mg at 12/24/20 0830   carvedilol (COREG) tablet 6.25 mg  6.25 mg Per Tube BID WC Pokhrel, Laxman, MD   6.25 mg at 12/24/20 0830   cephALEXin (KEFLEX) capsule 500 mg  500 mg Oral Q6H Manandhar, Collene Mares, MD   500 mg at 12/24/20 0044   diphenhydrAMINE (BENADRYL) capsule 50 mg  50 mg Oral Q6H PRN Chotiner, Yevonne Aline, MD   50 mg at 12/21/20 2300   enoxaparin (LOVENOX) injection 40 mg  40 mg Subcutaneous Q24H Charlynne Cousins, MD   40 mg at 12/24/20 0824   feeding supplement (OSMOLITE 1.5 CAL) liquid 237 mL  237 mL Per Tube 5 X Daily Domenic Polite, MD   237 mL at 12/21/20 2300   feeding supplement (PROSource TF) liquid 45 mL  45 mL Per Tube TID Pokhrel, Laxman, MD   45 mL at 12/21/20 2302   free water 200 mL  200 mL Per Tube 5 X Daily Domenic Polite, MD   200 mL at 12/21/20 2302   haloperidol lactate (HALDOL) injection 5 mg  5 mg Intramuscular Q6H PRN Chotiner, Yevonne Aline, MD   5 mg at 12/24/20 1158   haloperidol lactate (HALDOL) injection 5 mg  5 mg Intramuscular Q6H PRN Charlynne Cousins, MD       insulin aspart (novoLOG) injection 0-20 Units  0-20 Units Subcutaneous Q4H Kipp Brood, MD   3 Units at 12/24/20 1205   insulin glargine-yfgn (SEMGLEE) injection 25 Units  25 Units Subcutaneous BID Domenic Polite, MD   25 Units at 12/24/20 [phone]  labetalol (NORMODYNE) injection 10 mg  10 mg Intravenous Q10 min PRN Kipp Brood, MD   10 mg at 12/07/20 1824   LORazepam (ATIVAN) injection 2 mg  2 mg Intramuscular Once Chotiner, Yevonne Aline, MD        LORazepam (ATIVAN) tablet 2 mg  2 mg Per Tube Q4H PRN Charlynne Cousins, MD   2 mg at 12/22/20 2440   Or   LORazepam (ATIVAN) injection 2 mg  2 mg Intramuscular Q4H PRN Charlynne Cousins, MD   2 mg at 12/24/20 1328   losartan (COZAAR) tablet 25 mg  25 mg Per Tube Daily Sherren Mocha, MD   25 mg at 12/24/20 0830   morphine 2 MG/ML injection 2 mg  2 mg Intravenous Q4H PRN Domenic Polite, MD   2 mg at 12/19/20 1721   nicotine (NICODERM CQ - dosed in mg/24 hours) patch 21 mg  21 mg Transdermal Daily Charlynne Cousins, MD   21 mg at 12/24/20 0827   ondansetron (ZOFRAN) injection 4 mg  4 mg Intravenous Q6H PRN Kipp Brood, MD   4 mg at 12/06/20 2010   pantoprazole sodium (PROTONIX) 40 mg oral suspension 40 mg  40 mg Per Tube Daily Domenic Polite, MD   40 mg at 12/21/20 1027   sodium chloride flush (NS) 0.9 % injection 10-40 mL  10-40 mL Intracatheter Q12H Charlynne Cousins, MD   10 mL at 12/24/20 0837   sodium chloride flush (NS) 0.9 % injection 10-40 mL  10-40 mL Intracatheter PRN Charlynne Cousins, MD        Musculoskeletal: Strength & Muscle Tone: within normal limits, observed pulling on restraints intermittently Gait & Station:  did not assess ; patient in restraints Patient leans:  did not assess; patient in restraints     Psychiatric Specialty Exam:  Presentation  General Appearance:  [place]: Fleeting Speech: Normal Rate (appears to have difficulty with word finding) Speech Volume: Normal Handedness: No data recorded  Mood and Affect  Mood: Irritable Affect: No data recorded  Thought Process  Thought Processes: Linear; Other (comment) (linear to direct questions, apppears to have difficulty with word finding and expressing herself) Descriptions of Associations:No data recorded Orientation:No data recorded Thought Content:No data recorded History of Schizophrenia/Schizoaffective disorder:No data recorded Duration of Psychotic  Symptoms:No data recorded Hallucinations:Hallucinations: None Ideas of Reference:None Suicidal Thoughts:Suicidal Thoughts: No Homicidal Thoughts:Homicidal Thoughts: No  Sensorium  Memory: Immediate Poor; Recent Poor; Remote Poor Judgment: Poor Insight: Poor  Executive Functions  Concentration: Poor Attention Span: Poor Recall: Poor Fund of Knowledge: Poor Language: Poor  Psychomotor Activity  Psychomotor Activity: Psychomotor Activity: Normal  Assets  Assets: Desire for Improvement  Sleep  Sleep: Sleep: Fair  Physical Exam: Physical Exam Constitutional:      Comments: Appears fidgety, pulling at restraints   Eyes:     Extraocular Movements: Extraocular movements intact.  Pulmonary:     Effort: Pulmonary effort is normal.  Neurological:     Mental Status: She is alert.   Review of Systems  Psychiatric/Behavioral:  Negative for depression, hallucinations, substance abuse and suicidal ideas.   Blood pressure 126/67, pulse 89, temperature 98.4 F (36.9 C), temperature source Axillary, resp. rate 18, height 5' 6"  (1.676 m), weight 94.8 kg, SpO2 98 %. Body mass index is 33.72 kg/m.  Treatment Plan Summary: 45 yo with no reported psychiatric hx who presented to the hospital on 8/18 with  v fib arrest, patient has had a complicated hospital course  and was found to have anoxic brain injury. Psychiatry was consulted for apparent psychotic behavior that has developed. She has received multiple PRN medications in order to maintain safety and has been placed in restraints. On interview patient is irritable and requesting to go home, she lacks insight into why she is hospitalized and appears to have difficulty with word finding. Patient would benefit from scheduled medications to assist with mood/aggression. Labs reviewed-9/5 AST/ALT wnl. Recommend to start depakote 500 mg BID for mood stabilization and to continue to utilize prn haldol and ativan for agitation. Recommend  EKG to monitor qtc-last ekg was 8/19 with qtc 445.   Anoxic brain injury with severe agitation and behavioral disturbances: -patient has been receiving PRN haldol and ativan due to aggressive behavior and is currently in restraints -500 mg BID Depakote via NG tube for mood/aggression. Was contacted by pharmacy who reported that formulation was unable to fit in NG tube--pharmacy recommended 250 mg per tube q6 hours as effects do not normally last for 12 hours. -continue prn haldol 5 mg and ativan 2 mg q6 hours for agitation -recommend EKG for monitoring qtc  -communicated recs to primary team via epic secure chat -psychiatry to continue to follow  Disposition: per primary team. Does not meet criteria for inpatient psychiatric admission   Addendum: pharmacy recommendations as above  Ival Bible, MD Attending psychiatrist 12/24/2020 4:26 PM

## 2020-12-24 NOTE — Progress Notes (Signed)
Patient initially refusing medications, than agreed to take medications.  After taking crushed medications in applesauce, patient spit mixture out stating that it tasted horrible.  Refusing to eat breakfast.  Noted to be pulling at g-tube, new dressing placed by  PA Turpin.  Abdominal binder ordered.

## 2020-12-24 NOTE — Plan of Care (Signed)
  Problem: Education: Goal: Knowledge of General Education information will improve Description: Including pain rating scale, medication(s)/side effects and non-pharmacologic comfort measures Outcome: Not Progressing   Problem: Health Behavior/Discharge Planning: Goal: Ability to manage health-related needs will improve Outcome: Not Progressing

## 2020-12-25 DIAGNOSIS — I5021 Acute systolic (congestive) heart failure: Secondary | ICD-10-CM | POA: Diagnosis not present

## 2020-12-25 DIAGNOSIS — L03311 Cellulitis of abdominal wall: Secondary | ICD-10-CM | POA: Diagnosis not present

## 2020-12-25 DIAGNOSIS — I469 Cardiac arrest, cause unspecified: Secondary | ICD-10-CM | POA: Diagnosis not present

## 2020-12-25 DIAGNOSIS — G9340 Encephalopathy, unspecified: Secondary | ICD-10-CM | POA: Diagnosis not present

## 2020-12-25 DIAGNOSIS — R509 Fever, unspecified: Secondary | ICD-10-CM | POA: Diagnosis not present

## 2020-12-25 DIAGNOSIS — R451 Restlessness and agitation: Secondary | ICD-10-CM | POA: Diagnosis not present

## 2020-12-25 LAB — GLUCOSE, CAPILLARY
Glucose-Capillary: 136 mg/dL — ABNORMAL HIGH (ref 70–99)
Glucose-Capillary: 145 mg/dL — ABNORMAL HIGH (ref 70–99)
Glucose-Capillary: 101 mg/dL — ABNORMAL HIGH (ref 70–99)
Glucose-Capillary: 183 mg/dL — ABNORMAL HIGH (ref 70–99)
Glucose-Capillary: 107 mg/dL — ABNORMAL HIGH (ref 70–99)

## 2020-12-25 MED ORDER — DIVALPROEX SODIUM 250 MG PO DR TAB
500.0000 mg | DELAYED_RELEASE_TABLET | Freq: Two times a day (BID) | ORAL | Status: DC
  Filled 2020-12-25: qty 2

## 2020-12-25 MED ORDER — ACETAMINOPHEN 160 MG/5ML PO SOLN
650.0000 mg | Freq: Four times a day (QID) | ORAL | Status: DC
  Administered 2020-12-29 – 2021-01-02 (×11): 650 mg via ORAL
  Filled 2020-12-25 (×12): qty 20.3

## 2020-12-25 MED ORDER — PANTOPRAZOLE SODIUM 40 MG PO TBEC
40.0000 mg | DELAYED_RELEASE_TABLET | Freq: Two times a day (BID) | ORAL | Status: DC
  Administered 2020-12-25 – 2021-01-02 (×8): 40 mg via ORAL
  Filled 2020-12-25 (×10): qty 1

## 2020-12-25 MED ORDER — DIVALPROEX SODIUM 125 MG PO CSDR
250.0000 mg | DELAYED_RELEASE_CAPSULE | Freq: Three times a day (TID) | ORAL | Status: DC
  Administered 2020-12-25 – 2020-12-26 (×3): 250 mg via ORAL
  Filled 2020-12-25 (×8): qty 2

## 2020-12-25 NOTE — Consult Note (Signed)
[facility] Consult   Reason for Consult:  "Psychotic behavior after CPR" Referring Physician:  Dr. Aileen Fass Patient Identification: Courtney Davies MRN:  [id number] Principal Diagnosis: Fever Diagnosis:  Principal Problem:   Fever Active Problems:   Cardiac arrest ([place])   Type 1 diabetes mellitus without complication ([place])   Hypertension   GERD (gastroesophageal reflux disease)   Anemia   Encephalopathy acute   Acute systolic heart failure (HCC)   Dysphagia   Goals of care, counseling/discussion   Abdominal wall cellulitis   Total Time spent with patient: 20 minutes  Subjective:   Courtney Davies is a 45 y.o. female patient admitted with v fib arrest, patient has had a complicated hospital course and was found to have anoxic brain injury. Psychiatry was consulted for apparent psychotic behavior.  On chart review, patient has a chart that is marked for merge-patient does not appear to have any psychiatric history and has not had a history of psychosis in the past. Over the past couple days patient has been noted to be agitated and has received a total of 15 mg haldol and 6 mg ativan in the last 24 hours. MRI of brain performed 8/21 revealed "Areas of bilateral cerebral cortical reduced diffusion. Reduced diffusion along the hippocampi. This is consistent with hypoxic/ischemic injury".  Chart reviewed, no PRNs the last 24 hours and has been received depakote liquid formulation. On interview patient is found laying in bed in restraints. She is oriented to self only. She believes she is in [place], the month is march and the year is "august". She does not know that she is in the hospital and when she is informed of this, she appears surprised. She denies SI/HI/AVH  Discussed with Dr. Aileen Fass at bedside who states that she has pulled out PEG tube and IV and that she will be transitioned to oral depakote. Discussed possibility of depakote sprinkles as it can be mixed  in with food if she is unable/unwilling to take medication orally.     HPI:   Courtney Davies is an 45 y.o. female past medical history significant for essential hypertension, vitamin B12 deficiency obstructive sleep apnea admitted to the ICU on 12/01/2020 for A. fib arrest, subsequently patient was extubated on 12/06/2020, there is a concern of anoxic brain injury, CTA of the chest was negative for PE.  2D echo was done that showed mild cardiomyopathy seen by cardiology and recommended further work-up once neurological status is improved.  Transfer out of the ICU and noted to have some dysphagia and cognitive deficit core track was placed started on nutrition dysphagia is predominantly cognitive in nature. Seen by palliative care, and PEG tube placed on 12/13/2020.  Patient continues to play and pull on the PEG tube, IR has been consulted on 12/23/2020 to remove PEG tube. Wound care has been consulted And will probably need skilled nursing facility. On 12/18/2020 he developed fever with a severe leukocytosis and tachycardia subsequently noted to have abdominal wall cellulitis around PEG tube  Past Psychiatric History: denied, per chart review there is no previous psychiatric history  Risk to Self:  no Risk to Others:  no Prior Inpatient Therapy:  unknown Prior Outpatient Therapy:  unknown  Past Medical History:  Past Medical History:  Diagnosis Date   Anemia    Diabetes type 2, controlled ([place])    GERD (gastroesophageal reflux disease)    Hypertension     Past Surgical History:  Procedure Laterality Date  IR GASTROSTOMY TUBE MOD SED  12/13/2020   Family History: History reviewed. No pertinent family history. Family Psychiatric  History: unknown Social History:  Social History   Substance and Sexual Activity  Alcohol Use Not Currently     Social History   Substance and Sexual Activity  Drug Use Never    Social History   Socioeconomic History   Marital status: Single    Spouse  name: Not on file   Number of children: Not on file   Years of education: Not on file   Highest education level: Not on file  Occupational History   Not on file  Tobacco Use   Smoking status: Unknown   Smokeless tobacco: Not on file  Vaping Use   Vaping Use: Unknown  Substance and Sexual Activity   Alcohol use: Not Currently   Drug use: Never   Sexual activity: Not on file  Other Topics Concern   Not on file  Social History Narrative   Not on file   Social Determinants of Health   Financial Resource Strain: Not on file  Food Insecurity: Not on file  Transportation Needs: Not on file  Physical Activity: Not on file  Stress: Not on file  Social Connections: Not on file   Additional Social History:    Allergies:   Allergies  Allergen Reactions   Other Anaphylaxis and Hives    Hair Glue   Codeine Nausea And Vomiting   Latex Hives   Nitrofurantoin Rash    Labs:  Results for orders placed or performed during the hospital encounter of 12/01/20 (from the past 48 hour(s))  Glucose, capillary     Status: Abnormal   Collection Time: 12/23/20  8:17 PM  Result Value Ref Range   Glucose-Capillary 129 (H) 70 - 99 mg/dL    Comment: Glucose reference range applies only to samples taken after fasting for at least 8 hours.  Glucose, capillary     Status: Abnormal   Collection Time: 12/24/20 12:04 AM  Result Value Ref Range   Glucose-Capillary 191 (H) 70 - 99 mg/dL    Comment: Glucose reference range applies only to samples taken after fasting for at least 8 hours.  Glucose, capillary     Status: Abnormal   Collection Time: 12/24/20  4:13 AM  Result Value Ref Range   Glucose-Capillary 162 (H) 70 - 99 mg/dL    Comment: Glucose reference range applies only to samples taken after fasting for at least 8 hours.  Glucose, capillary     Status: Abnormal   Collection Time: 12/24/20  7:38 AM  Result Value Ref Range   Glucose-Capillary 203 (H) 70 - 99 mg/dL    Comment: Glucose  reference range applies only to samples taken after fasting for at least 8 hours.  Glucose, capillary     Status: Abnormal   Collection Time: 12/24/20 11:52 AM  Result Value Ref Range   Glucose-Capillary 124 (H) 70 - 99 mg/dL    Comment: Glucose reference range applies only to samples taken after fasting for at least 8 hours.  Glucose, capillary     Status: Abnormal   Collection Time: 12/24/20  5:41 PM  Result Value Ref Range   Glucose-Capillary 342 (H) 70 - 99 mg/dL    Comment: Glucose reference range applies only to samples taken after fasting for at least 8 hours.  Glucose, capillary     Status: Abnormal   Collection Time: 12/24/20  8:05 PM  Result Value Ref Range  Glucose-Capillary 257 (H) 70 - 99 mg/dL    Comment: Glucose reference range applies only to samples taken after fasting for at least 8 hours.  Glucose, capillary     Status: None   Collection Time: 12/24/20 11:55 PM  Result Value Ref Range   Glucose-Capillary 98 70 - 99 mg/dL    Comment: Glucose reference range applies only to samples taken after fasting for at least 8 hours.  Glucose, capillary     Status: Abnormal   Collection Time: 12/25/20  4:20 AM  Result Value Ref Range   Glucose-Capillary 136 (H) 70 - 99 mg/dL    Comment: Glucose reference range applies only to samples taken after fasting for at least 8 hours.  Glucose, capillary     Status: Abnormal   Collection Time: 12/25/20  7:33 AM  Result Value Ref Range   Glucose-Capillary 145 (H) 70 - 99 mg/dL    Comment: Glucose reference range applies only to samples taken after fasting for at least 8 hours.  Glucose, capillary     Status: Abnormal   Collection Time: 12/25/20 11:52 AM  Result Value Ref Range   Glucose-Capillary 101 (H) 70 - 99 mg/dL    Comment: Glucose reference range applies only to samples taken after fasting for at least 8 hours.  Glucose, capillary     Status: Abnormal   Collection Time: 12/25/20  3:44 PM  Result Value Ref Range    Glucose-Capillary 183 (H) 70 - 99 mg/dL    Comment: Glucose reference range applies only to samples taken after fasting for at least 8 hours.    Current Facility-Administered Medications  Medication Dose Route Frequency Provider Last Rate Last Admin   acetaminophen (TYLENOL) 160 MG/5ML solution 650 mg  650 mg Per Tube Q6H Agarwala, Ravi, MD   650 mg at 12/24/20 2129   albuterol (VENTOLIN HFA) 108 (90 Base) MCG/ACT inhaler 2 puff  2 puff Inhalation Q4H PRN Shearon Stalls, Rahul P, PA-C   2 puff at 12/07/20 2004   amiodarone (PACERONE) tablet 200 mg  200 mg Per Tube BID Sherren Mocha, MD   200 mg at 12/25/20 0859   carvedilol (COREG) tablet 6.25 mg  6.25 mg Per Tube BID WC Pokhrel, Laxman, MD   6.25 mg at 12/25/20 0859   diphenhydrAMINE (BENADRYL) capsule 50 mg  50 mg Oral Q6H PRN Chotiner, Yevonne Aline, MD   50 mg at 12/21/20 2300   divalproex (DEPAKOTE SPRINKLE) capsule 250 mg  250 mg Oral Q8H Charlynne Cousins, MD   250 mg at 12/25/20 1517   enoxaparin (LOVENOX) injection 40 mg  40 mg Subcutaneous Q24H Charlynne Cousins, MD   40 mg at 12/25/20 0902   feeding supplement (PROSource TF) liquid 45 mL  45 mL Per Tube TID Pokhrel, Laxman, MD   45 mL at 12/21/20 2302   haloperidol lactate (HALDOL) injection 5 mg  5 mg Intramuscular Q6H PRN Chotiner, Yevonne Aline, MD   5 mg at 12/24/20 1158   haloperidol lactate (HALDOL) injection 5 mg  5 mg Intramuscular Q6H PRN Charlynne Cousins, MD   5 mg at 12/25/20 1542   insulin aspart (novoLOG) injection 0-20 Units  0-20 Units Subcutaneous Q4H Kipp Brood, MD   4 Units at 12/25/20 1548   insulin glargine-yfgn (SEMGLEE) injection 25 Units  25 Units Subcutaneous BID Domenic Polite, MD   25 Units at 12/25/20 0911   labetalol (NORMODYNE) injection 10 mg  10 mg Intravenous Q10 min PRN Kipp Brood, MD  10 mg at 12/07/20 1824   LORazepam (ATIVAN) injection 1 mg  1 mg Intravenous Q4H PRN Chotiner, Yevonne Aline, MD   1 mg at 12/25/20 1253   LORazepam (ATIVAN) injection 2  mg  2 mg Intramuscular Once Chotiner, Yevonne Aline, MD       LORazepam (ATIVAN) tablet 2 mg  2 mg Per Tube Q4H PRN Charlynne Cousins, MD   2 mg at 12/22/20 0854   losartan (COZAAR) tablet 25 mg  25 mg Per Tube Daily Sherren Mocha, MD   25 mg at 12/25/20 0859   morphine 2 MG/ML injection 2 mg  2 mg Intravenous Q4H PRN Domenic Polite, MD   2 mg at 12/19/20 1721   nicotine (NICODERM CQ - dosed in mg/24 hours) patch 21 mg  21 mg Transdermal Daily Charlynne Cousins, MD   21 mg at 12/25/20 0905   ondansetron (ZOFRAN) injection 4 mg  4 mg Intravenous Q6H PRN Kipp Brood, MD   4 mg at 12/06/20 2010   pantoprazole (PROTONIX) EC tablet 40 mg  40 mg Oral BID Charlynne Cousins, MD       sodium chloride flush (NS) 0.9 % injection 10-40 mL  10-40 mL Intracatheter Q12H Charlynne Cousins, MD   10 mL at 12/25/20 0906   sodium chloride flush (NS) 0.9 % injection 10-40 mL  10-40 mL Intracatheter PRN Charlynne Cousins, MD        Musculoskeletal: Strength & Muscle Tone: within normal limits, observed pulling on restraints intermittently Gait & Station:  did not assess ; patient in restraints Patient leans:  did not assess; patient in restraints     Psychiatric Specialty Exam:  Presentation  General Appearance:  Disheveled Eye Contact: Fleeting Speech: Normal Rate Speech Volume: Normal Handedness: No data recorded  Mood and Affect  Mood: Anxious; Angry; Irritable Affect: Appropriate; Congruent  Thought Process  Thought Processes: Linear; Other (comment) (linear to direct questions; appears to have difficulty expressing herself and with word finding) Descriptions of Associations:Intact Orientation:Other (comment) (oriented to self but not to year, month, location or situation) Thought Content:No data recorded History of Schizophrenia/Schizoaffective disorder:No data recorded Duration of Psychotic Symptoms:No data recorded Hallucinations:Hallucinations: None Ideas of  Reference:None Suicidal Thoughts:Suicidal Thoughts: No Homicidal Thoughts:Homicidal Thoughts: No  Sensorium  Memory: Immediate Poor; Recent Poor; Remote Poor Judgment: Poor Insight: Poor  Executive Functions  Concentration: Poor Attention Span: Poor Recall: Poor Fund of Knowledge: Poor Language: Poor  Psychomotor Activity  Psychomotor Activity: Psychomotor Activity: Normal  Assets  Assets: Desire for Improvement  Sleep  Sleep: Sleep: Fair  Physical Exam: Physical Exam Constitutional:      Comments: Appears fidgety, pulling at restraints   Eyes:     Extraocular Movements: Extraocular movements intact.  Pulmonary:     Effort: Pulmonary effort is normal.  Neurological:     Mental Status: She is alert.   Review of Systems  Psychiatric/Behavioral:  Negative for depression, hallucinations, substance abuse and suicidal ideas.   Blood pressure 108/66, pulse 85, temperature 98.5 F (36.9 C), temperature source Axillary, resp. rate 16, height 5' 6"  (1.676 m), weight 95.3 kg, SpO2 98 %. Body mass index is 33.91 kg/m.  Treatment Plan Summary: 45 yo with no reported psychiatric hx who presented to the hospital on 8/18 with  v fib arrest, patient has had a complicated hospital course and was found to have anoxic brain injury. Psychiatry was consulted for apparent psychotic behavior that has developed. She has received multiple PRN medications  in order to maintain safety and has been placed in restraints. On interview patient is irritable and requesting to go home, she lacks insight into why she is hospitalized and appears to have difficulty with word finding. Patient would benefit from scheduled medications to assist with mood/aggression. Labs reviewed-9/5 AST/ALT wnl. Recommend to adjust  depakote 500 mg BID PO as she has removed PEG tube and IV for mood stabilization and to continue to utilize prn haldol and ativan for agitation. Recommend EKG to monitor qtc-last ekg was  8/19 with qtc 445.   Anoxic brain injury with severe agitation and behavioral disturbances: -patient has been receiving PRN haldol and ativan due to aggressive behavior and is currently in restraints -500 mg BID Depakote PO; depakote sprinkle formulation if unable to take PO  -continue prn haldol 5 mg and ativan 2 mg q6 hours for agitation -recommend EKG for monitoring qtc  -communicated recs to primary team in person -psychiatry to continue to follow  Disposition: per primary team. Does not meet criteria for inpatient psychiatric admission     Ival Bible, MD Attending psychiatrist 12/25/2020 4:54 PM

## 2020-12-25 NOTE — Progress Notes (Signed)
Nurse notified patient had pulled out her G-tube. Patient now awake and agitated, G-tube intact in bed.  Patient remains in restraints, but had moved down in bed allowing hands reach tube. Site cleaned with NS and gauze dressing applied.  Dr. Aileen Fass notified.  IR notified.  Tube to remain out at this time. Will continue to monitor site.

## 2020-12-25 NOTE — Progress Notes (Signed)
[facility] PROGRESS NOTE    Progress Note  BEVERLEE WILMARTH  NLG:[id number] DOB: 1975/07/01 DOA: 12/01/2020 PCP: Pcp, No     Brief Narrative:   TIFFNY GEMMER is an 45 y.o. female past medical history significant for essential hypertension, vitamin B12 deficiency obstructive sleep apnea admitted to the ICU on 12/01/2020 for A. fib arrest, subsequently patient was extubated on 12/06/2020, there is a concern of anoxic brain injury, CTA of the chest was negative for PE.  2D echo was done that showed mild cardiomyopathy seen by cardiology and recommended further work-up once neurological status is improved.  Transfer out of the ICU and noted to have some dysphagia and cognitive deficit core track was placed started on nutrition dysphagia is predominantly cognitive in nature. Seen by palliative care, and PEG tube placed on 12/13/2020.  Patient continues to play and pull on the PEG tube, IR has been consulted on 12/23/2020 to remove PEG tube. Wound care has been consulted And will probably need skilled nursing facility. On 12/18/2020 he developed fever with a severe leukocytosis and tachycardia subsequently noted to have abdominal wall cellulitis around PEG tube    Assessment/Plan:   V. fib arrest with acute respiratory failure with hypoxia and acute encephalopathy: Cardiology deemed not appropriate for ischemic work-up until neurological status improved. Continue amiodarone and Coreg. Discharge planning per [facility] to skilled nursing facility.  Abdominal wall cellulitis around PEG tube, Noticed on 12/19/2020 started on IV Rocephin cultures have been negative so far. Infectious disease consulted who recommended to change to Keflex.  Which we will continue for 2 weeks. Patient pulled out PEG tube.  Consulted recommended to place foam dressing over it.  Acute systolic heart failure: In the setting of V. fib arrest a 2D echo was done that showed an EF of 40% with global hypokinesia. Continue Coreg  and losartan.  Paroxysmal atrial fibrillation: Rate controlled on amiodarone and Coreg.  Transient episode cardiology recommended against long-term anticoagulation.  Thyroid nodule: Follow-up with PCP as an outpatient.  Type 2 diabetes mellitus: With an A1c of 6.9 continue long-acting insulin plus sliding scale.  Aspiration pneumonia: Resolved.  Ambulatory dysfunction/deconditioning: Physical therapy was consulted recommended skilled nursing facility.  Anoxic brain injury with severe agitation and behavioral disturbances: Patient came out of her room naked screaming yelling with foul language. Psychiatry recommended to continue Haldol IV as needed. Continue Depakote which we have changed to oral has discussed with psychiatrist who recommended to change to Depakote sprinkles if she does not tolerate it.   Nutrition tube feedings: PEG tube has been removed. I have discussed with the family they will, and try to feed the patient as she is done well eating with the family.  DVT prophylaxis: lovenox Family Communication:mother Status is: Inpatient  Remains inpatient appropriate because:Hemodynamically unstable  Dispo: The patient is from: Home              Anticipated d/c is to: SNF              Patient currently is not medically stable to d/c.   Difficult to place patient No    Code Status:     Code Status Orders  (From admission, onward)           Start     Ordered   12/01/20 1304  Full code  Continuous        12/01/20 1305           Code Status History  This patient has a current code status but no historical code status.         IV Access:   Peripheral IV   Procedures and diagnostic studies:   No results found.   Medical Consultants:   None.   Subjective:    [name] has no new complaints today, she relates he is hungry.  Objective:    Vitals:   12/24/20 1600 12/24/20 2006 12/25/20 0421 12/25/20 0857  BP: 126/79  114/75 102/63 108/66  Pulse: 84 91 85   Resp: 18 16 16    Temp: 97.9 F (36.6 C) 98.2 F (36.8 C) 98.5 F (36.9 C)   TempSrc: Axillary Axillary Axillary   SpO2: 98% 100% 98%   Weight:   95.3 kg   Height:       SpO2: 98 % O2 Flow Rate (L/min): 2 L/min FiO2 (%): 29 %   Intake/Output Summary (Last 24 hours) at 12/25/2020 0934 Last data filed at 12/24/2020 2000 Gross per 24 hour  Intake 510 ml  Output 0 ml  Net 510 ml    Filed Weights   12/22/20 0500 12/24/20 0423 12/25/20 0421  Weight: 97.1 kg 94.8 kg 95.3 kg    Exam: General exam: In no acute distress. Respiratory system: Good air movement and clear to auscultation. Cardiovascular system: S1 & S2 heard, RRR. No JVD. Gastrointestinal system: Abdomen is nondistended, soft and nontender.  Extremities: No pedal edema. Skin: No rashes, lesions or ulcers  Data Reviewed:    Labs: Basic Metabolic Panel: Recent Labs  Lab 12/19/20 0055 12/21/20 0611  NA 131* 133*  K 4.3 4.3  CL 104 103  CO2 18* 21*  GLUCOSE 170* 121*  BUN 34* 21*  CREATININE 1.14* 0.60  CALCIUM 8.6* 8.8*    GFR Estimated Creatinine Clearance: 104.4 mL/min (by C-G formula based on SCr of 0.6 mg/dL). Liver Function Tests: Recent Labs  Lab 12/19/20 0055  AST 28  ALT 36  ALKPHOS 101  BILITOT 0.4  PROT 6.6  ALBUMIN 2.4*    No results for input(s): LIPASE, AMYLASE in the last 168 hours. No results for input(s): AMMONIA in the last 168 hours. Coagulation profile No results for input(s): INR, PROTIME in the last 168 hours. COVID-19 Labs  No results for input(s): DDIMER, FERRITIN, LDH, CRP in the last 72 hours.  Lab Results  Component Value Date   SARSCOV2NAA POSITIVE (A) 12/15/2020   SARSCOV2NAA POSITIVE (A) 12/01/2020    CBC: Recent Labs  Lab 12/19/20 0055 12/21/20 0611  WBC 21.0* 16.5*  HGB 10.0* 10.2*  HCT 31.9* 32.2*  MCV 71.7* 69.8*  PLT 329 390    Cardiac Enzymes: No results for input(s): CKTOTAL, CKMB, CKMBINDEX,  TROPONINI in the last 168 hours. BNP (last 3 results) No results for input(s): PROBNP in the last 8760 hours. CBG: Recent Labs  Lab 12/24/20 1741 12/24/20 2005 12/24/20 2355 12/25/20 0420 12/25/20 0733  GLUCAP 342* 257* 98 136* 145*    D-Dimer: No results for input(s): DDIMER in the last 72 hours. Hgb A1c: No results for input(s): HGBA1C in the last 72 hours. Lipid Profile: No results for input(s): CHOL, HDL, LDLCALC, TRIG, CHOLHDL, LDLDIRECT in the last 72 hours. Thyroid function studies: No results for input(s): TSH, T4TOTAL, T3FREE, THYROIDAB in the last 72 hours.  Invalid input(s): FREET3 Anemia work up: No results for input(s): VITAMINB12, FOLATE, FERRITIN, TIBC, IRON, RETICCTPCT in the last 72 hours. Sepsis Labs: Recent Labs  Lab 12/18/20 1154 12/19/20 0055 12/21/20 5053  PROCALCITON 0.27 0.43  --   WBC  --  21.0* 16.5*    Microbiology Recent Results (from the past 240 hour(s))  SARS CORONAVIRUS 2 (TAT 6-24 HRS) Nasopharyngeal Nasopharyngeal Swab     Status: Abnormal   Collection Time: 12/15/20  2:05 PM   Specimen: Nasopharyngeal Swab  Result Value Ref Range Status   SARS Coronavirus 2 POSITIVE (A) NEGATIVE Final    Comment: (NOTE) SARS-CoV-2 target nucleic acids are DETECTED.  The SARS-CoV-2 RNA is generally detectable in upper and lower respiratory specimens during the acute phase of infection. Positive results are indicative of the presence of SARS-CoV-2 RNA. Clinical correlation with patient history and other diagnostic information is  necessary to determine patient infection status. Positive results do not rule out bacterial infection or co-infection with other viruses.  The expected result is Negative.  Fact Sheet for Patients: SugarRoll.be  Fact Sheet for Healthcare Providers: https://www.woods-mathews.com/  This test is not yet approved or cleared by the [place] FDA and  has been authorized for  detection and/or diagnosis of SARS-CoV-2 by FDA under an Emergency Use Authorization (EUA). This EUA will remain  in effect (meaning this test can be used) for the duration of the COVID-19 declaration under Section 564(b)(1) of the Act, 21 U. S.C. section 360bbb-3(b)(1), unless the authorization is terminated or revoked sooner.   Performed at [facility], [place] [address] [place], [place] [address]   Urine Culture     Status: None   Collection Time: 12/17/20  8:49 AM   Specimen: In/Out Cath Urine  Result Value Ref Range Status   Specimen Description IN/OUT CATH URINE  Final   Special Requests NONE  Final   Culture   Final    NO GROWTH Performed at [facility], [place] [address] [place], [place] [address]    Report Status 12/18/2020 FINAL  Final  Culture, blood (routine x 2)     Status: None   Collection Time: 12/18/20  7:26 AM   Specimen: BLOOD  Result Value Ref Range Status   Specimen Description BLOOD SITE NOT SPECIFIED  Final   Special Requests AEROBIC BOTTLE ONLY Blood Culture adequate volume  Final   Culture   Final    NO GROWTH 5 DAYS Performed at [facility], [address] [place], [place] [address]    Report Status 12/23/2020 FINAL  Final  Culture, blood (routine x 2)     Status: None   Collection Time: 12/18/20  7:26 AM   Specimen: BLOOD  Result Value Ref Range Status   Specimen Description BLOOD SITE NOT SPECIFIED  Final   Special Requests   Final    AEROBIC BOTTLE ONLY Blood Culture results may not be optimal due to an inadequate volume of blood received in culture bottles   Culture   Final    NO GROWTH 5 DAYS Performed at [facility], [place] [address] [place], [place] [address]    Report Status 12/23/2020 FINAL  Final     Medications:    acetaminophen (TYLENOL) oral liquid 160 mg/5 mL  650 mg Per Tube Q6H   amiodarone  200 mg Per Tube BID   carvedilol  6.25 mg Per Tube BID WC   enoxaparin (LOVENOX) injection  40 mg  Subcutaneous Q24H   feeding supplement (OSMOLITE 1.5 CAL)  237 mL Per Tube 5 X Daily   feeding supplement (PROSource TF)  45 mL Per Tube TID   free water  200 mL Per Tube 5 X Daily   insulin aspart  0-20 Units Subcutaneous Q4H   insulin glargine-yfgn  25 Units Subcutaneous BID   LORazepam  2 mg Intramuscular Once   losartan  25 mg Per Tube Daily   nicotine  21 mg Transdermal Daily   pantoprazole sodium  40 mg Per Tube Daily   sodium chloride flush  10-40 mL Intracatheter Q12H   valproic acid  250 mg Per Tube Q6H   Continuous Infusions:    LOS: 24 days   Charlynne Cousins  [facility]  12/25/2020, 9:34 AM

## 2020-12-26 DIAGNOSIS — I5021 Acute systolic (congestive) heart failure: Secondary | ICD-10-CM | POA: Diagnosis not present

## 2020-12-26 DIAGNOSIS — Z4659 Encounter for fitting and adjustment of other gastrointestinal appliance and device: Secondary | ICD-10-CM

## 2020-12-26 DIAGNOSIS — L03311 Cellulitis of abdominal wall: Secondary | ICD-10-CM | POA: Diagnosis not present

## 2020-12-26 DIAGNOSIS — G9340 Encephalopathy, unspecified: Secondary | ICD-10-CM | POA: Diagnosis not present

## 2020-12-26 DIAGNOSIS — I469 Cardiac arrest, cause unspecified: Secondary | ICD-10-CM | POA: Diagnosis not present

## 2020-12-26 LAB — CBC WITH DIFFERENTIAL/PLATELET
Abs Immature Granulocytes: 0.19 10*3/uL — ABNORMAL HIGH (ref 0.00–0.07)
Basophils Absolute: 0 10*3/uL (ref 0.0–0.1)
Basophils Relative: 0 %
Eosinophils Absolute: 0.5 10*3/uL (ref 0.0–0.5)
Eosinophils Relative: 5 %
HCT: 35.9 % — ABNORMAL LOW (ref 36.0–46.0)
Hemoglobin: 11.4 g/dL — ABNORMAL LOW (ref 12.0–15.0)
Immature Granulocytes: 2 %
Lymphocytes Relative: 30 %
Lymphs Abs: 3 10*3/uL (ref 0.7–4.0)
MCH: 22.8 pg — ABNORMAL LOW (ref 26.0–34.0)
MCHC: 31.8 g/dL (ref 30.0–36.0)
MCV: 71.9 fL — ABNORMAL LOW (ref 80.0–100.0)
Monocytes Absolute: 0.6 10*3/uL (ref 0.1–1.0)
Monocytes Relative: 6 %
Neutro Abs: 5.6 10*3/uL (ref 1.7–7.7)
Neutrophils Relative %: 57 %
Platelets: 451 10*3/uL — ABNORMAL HIGH (ref 150–400)
RBC: 4.99 MIL/uL (ref 3.87–5.11)
RDW: 15.2 % (ref 11.5–15.5)
WBC: 9.8 10*3/uL (ref 4.0–10.5)
nRBC: 0 % (ref 0.0–0.2)

## 2020-12-26 LAB — GLUCOSE, CAPILLARY
Glucose-Capillary: 118 mg/dL — ABNORMAL HIGH (ref 70–99)
Glucose-Capillary: 126 mg/dL — ABNORMAL HIGH (ref 70–99)
Glucose-Capillary: 132 mg/dL — ABNORMAL HIGH (ref 70–99)
Glucose-Capillary: 134 mg/dL — ABNORMAL HIGH (ref 70–99)
Glucose-Capillary: 154 mg/dL — ABNORMAL HIGH (ref 70–99)
Glucose-Capillary: 96 mg/dL (ref 70–99)

## 2020-12-26 MED ORDER — HALOPERIDOL LACTATE 5 MG/ML IJ SOLN
5.0000 mg | Freq: Two times a day (BID) | INTRAMUSCULAR | Status: DC
  Administered 2020-12-27 – 2020-12-28 (×3): 5 mg via INTRAVENOUS
  Filled 2020-12-26 (×4): qty 1

## 2020-12-26 NOTE — Progress Notes (Signed)
Pt BS was 132 @2222 . Notified provider. Provider informed me to hold all insulin and PO medications due to pt NPO status and open wound.

## 2020-12-26 NOTE — Progress Notes (Addendum)
Received call from unit RN that patient had pulled her gastrostomy tube overnight.  Remains confused. Per MD note, no plans to replace for now. Continues with purulent-appearing drainage, on abx daily. Now also with undigested food leaking from tract.   Recommend NPO x6-8 hrs, advance to clear liquids and monitor output/leakage.  If able to advance to clears this evening without significant leakage, would advance diet slowly to soft foods.  Tract is fresh and compromised due to cellulitis which may require additional healing time. NPO status may need to be extended if she continues to have significant leakage.   Brynda Greathouse, MS RD PA-C

## 2020-12-26 NOTE — Progress Notes (Addendum)
[facility] PROGRESS NOTE    Progress Note  Courtney Davies  EZM:[id number] DOB: 1976-04-01 DOA: 12/01/2020 PCP: Pcp, No     Brief Narrative:   Courtney Davies is an 45 y.o. female past medical history significant for essential hypertension, vitamin B12 deficiency obstructive sleep apnea admitted to the ICU on 12/01/2020 for A. fib arrest, subsequently patient was extubated on 12/06/2020, there is a concern of anoxic brain injury, CTA of the chest was negative for PE.  2D echo was done that showed mild cardiomyopathy seen by cardiology and recommended further work-up once neurological status is improved.  Transfer out of the ICU and noted to have some dysphagia and cognitive deficit core track was placed started on nutrition dysphagia is predominantly cognitive in nature. Seen by palliative care, and PEG tube placed on 12/13/2020.  Patient continues to play and pull on the PEG tube, IR has been consulted on 12/23/2020 to remove PEG tube. Wound care has been consulted And will probably need skilled nursing facility. On 12/18/2020 he developed fever with a severe leukocytosis and tachycardia subsequently noted to have abdominal wall cellulitis around PEG tube, on 12/25/2020 she pulled out her PEG tube this was discontinued and now she is tolerating her diet, she eats all of her food when she eats with her family.  With the nursing staff she is resistant to eat.    Assessment/Plan:   V. fib arrest with acute respiratory failure with hypoxia and acute encephalopathy: Cardiology deemed not appropriate for ischemic work-up until neurological status improved. Continue amiodarone and Coreg. Discharge planning per [facility] to skilled nursing facility.  Abdominal wall cellulitis around PEG tube, Noticed on 12/19/2020 started on IV Rocephin cultures have been negative so far. Infectious disease consulted who recommended to change to Keflex.  Which we will continue for 2 weeks. Patient pulled out PEG tube.   Consulted recommended to place foam dressing over it.  Acute systolic heart failure: In the setting of V. fib arrest a 2D echo was done that showed an EF of 40% with global hypokinesia. Continue Coreg and losartan.  Paroxysmal atrial fibrillation: Rate controlled on amiodarone and Coreg.  Transient episode cardiology recommended against long-term anticoagulation.  Thyroid nodule: Follow-up with PCP as an outpatient.  Type 2 diabetes mellitus: With an A1c of 6.9 continue long-acting insulin plus sliding scale.  Aspiration pneumonia: Resolved.  Ambulatory dysfunction/deconditioning: Physical therapy was consulted recommended skilled nursing facility.  Anoxic brain injury with severe agitation and behavioral disturbances: Patient came out of her room naked screaming yelling with foul language. Patient continues to be agitated and eating less than 25% of her meals when she is fed by the nursing staff but when she is fed by family she is 100%. Psychiatry recommended difficult sprinkles to room continue Haldol IV as needed. She needs to be off restraints for 24 hours before we get her to a rehab facility.  Nutrition tube feedings: PEG tube has been removed. I have discussed with the family they will, and try to feed the patient as she is done well eating with the family.  DVT prophylaxis: lovenox Family Communication:mother Status is: Inpatient  Remains inpatient appropriate because:Hemodynamically unstable  Dispo: The patient is from: Home              Anticipated d/c is to: SNF              Patient currently is not medically stable to d/c.   Difficult to place patient No  Code Status:     Code Status Orders  (From admission, onward)           Start     Ordered   12/01/20 1304  Full code  Continuous        12/01/20 1305           Code Status History     This patient has a current code status but no historical code status.         IV Access:    Peripheral IV   Procedures and diagnostic studies:   No results found.   Medical Consultants:   None.   Subjective:    Courtney Davies she relates she is hungry.  Objective:    Vitals:   12/26/20 0500 12/26/20 0745 12/26/20 1026 12/26/20 1137  BP: 116/66 119/79 112/72 109/66  Pulse: 74 77 80 85  Resp: 18 18  17   Temp: 97.6 F (36.4 C) 98.5 F (36.9 C)  98.7 F (37.1 C)  TempSrc: Axillary Axillary  Oral  SpO2: 94% 99%  100%  Weight: 94.5 kg     Height:       SpO2: 100 % O2 Flow Rate (L/min): 2 L/min FiO2 (%): 29 %   Intake/Output Summary (Last 24 hours) at 12/26/2020 1159 Last data filed at 12/26/2020 1003 Gross per 24 hour  Intake 840 ml  Output --  Net 840 ml    Filed Weights   12/24/20 0423 12/25/20 0421 12/26/20 0500  Weight: 94.8 kg 95.3 kg 94.5 kg    Exam: General exam: In no acute distress. Respiratory system: Good air movement and clear to auscultation. Cardiovascular system: S1 & S2 heard, RRR. No JVD. Gastrointestinal system: Abdomen is nondistended, soft and nontender.  Extremities: No pedal edema. Skin: No rashes, lesions or ulcers  Data Reviewed:    Labs: Basic Metabolic Panel: Recent Labs  Lab 12/21/20 0611  NA 133*  K 4.3  CL 103  CO2 21*  GLUCOSE 121*  BUN 21*  CREATININE 0.60  CALCIUM 8.8*    GFR Estimated Creatinine Clearance: 104 mL/min (by C-G formula based on SCr of 0.6 mg/dL). Liver Function Tests: No results for input(s): AST, ALT, ALKPHOS, BILITOT, PROT, ALBUMIN in the last 168 hours.  No results for input(s): LIPASE, AMYLASE in the last 168 hours. No results for input(s): AMMONIA in the last 168 hours. Coagulation profile No results for input(s): INR, PROTIME in the last 168 hours. COVID-19 Labs  No results for input(s): DDIMER, FERRITIN, LDH, CRP in the last 72 hours.  Lab Results  Component Value Date   SARSCOV2NAA POSITIVE (A) 12/15/2020   SARSCOV2NAA POSITIVE (A) 12/01/2020    CBC: Recent  Labs  Lab 12/21/20 0611  WBC 16.5*  HGB 10.2*  HCT 32.2*  MCV 69.8*  PLT 390    Cardiac Enzymes: No results for input(s): CKTOTAL, CKMB, CKMBINDEX, TROPONINI in the last 168 hours. BNP (last 3 results) No results for input(s): PROBNP in the last 8760 hours. CBG: Recent Labs  Lab 12/25/20 1544 12/25/20 2043 12/26/20 0027 12/26/20 0513 12/26/20 0807  GLUCAP 183* 107* 134* 126* 96    D-Dimer: No results for input(s): DDIMER in the last 72 hours. Hgb A1c: No results for input(s): HGBA1C in the last 72 hours. Lipid Profile: No results for input(s): CHOL, HDL, LDLCALC, TRIG, CHOLHDL, LDLDIRECT in the last 72 hours. Thyroid function studies: No results for input(s): TSH, T4TOTAL, T3FREE, THYROIDAB in the last 72 hours.  Invalid input(s): FREET3  Anemia work up: No results for input(s): VITAMINB12, FOLATE, FERRITIN, TIBC, IRON, RETICCTPCT in the last 72 hours. Sepsis Labs: Recent Labs  Lab 12/21/20 0611  WBC 16.5*    Microbiology Recent Results (from the past 240 hour(s))  Urine Culture     Status: None   Collection Time: 12/17/20  8:49 AM   Specimen: In/Out Cath Urine  Result Value Ref Range Status   Specimen Description IN/OUT CATH URINE  Final   Special Requests NONE  Final   Culture   Final    NO GROWTH Performed at [facility], [address] [place], [place] [address]    Report Status 12/18/2020 FINAL  Final  Culture, blood (routine x 2)     Status: None   Collection Time: 12/18/20  7:26 AM   Specimen: BLOOD  Result Value Ref Range Status   Specimen Description BLOOD SITE NOT SPECIFIED  Final   Special Requests AEROBIC BOTTLE ONLY Blood Culture adequate volume  Final   Culture   Final    NO GROWTH 5 DAYS Performed at [facility], [address], [place], [place] [address]    Report Status 12/23/2020 FINAL  Final  Culture, blood (routine x 2)     Status: None   Collection Time: 12/18/20  7:26 AM   Specimen: BLOOD  Result Value Ref  Range Status   Specimen Description BLOOD SITE NOT SPECIFIED  Final   Special Requests   Final    AEROBIC BOTTLE ONLY Blood Culture results may not be optimal due to an inadequate volume of blood received in culture bottles   Culture   Final    NO GROWTH 5 DAYS Performed at [facility], [place] [address], [place], [place] [address]    Report Status 12/23/2020 FINAL  Final     Medications:    acetaminophen (TYLENOL) oral liquid 160 mg/5 mL  650 mg Oral Q6H   amiodarone  200 mg Per Tube BID   carvedilol  6.25 mg Per Tube BID WC   divalproex  250 mg Oral Q8H   enoxaparin (LOVENOX) injection  40 mg Subcutaneous Q24H   feeding supplement (PROSource TF)  45 mL Per Tube TID   insulin aspart  0-20 Units Subcutaneous Q4H   insulin glargine-yfgn  25 Units Subcutaneous BID   LORazepam  2 mg Intramuscular Once   losartan  25 mg Per Tube Daily   nicotine  21 mg Transdermal Daily   pantoprazole  40 mg Oral BID   sodium chloride flush  10-40 mL Intracatheter Q12H   Continuous Infusions:    LOS: 25 days   Courtney Davies  [facility]  12/26/2020, 11:59 AM

## 2020-12-26 NOTE — TOC Progression Note (Signed)
Transition of Care [facility]) - Progression Note    Patient Details  Name: Courtney Davies MRN: [id number] Date of Birth: [birth year]  Transition of [facility] CM/SW [place], [place] Phone Number: 12/26/2020, 12:36 PM  Clinical Narrative:     CSW reached out to CIR to see if they could assess patient for possible CIR placement. CSW spoke with [facility] with CIR. Caitlin with CIR confirmed she will follow up with CSW tomorrow after speaking with Dr. Naaman Plummer to confirm if patient is CIR appropriate. CSW will continue to follow and assist with dc planning needs.  Expected Discharge Plan: [place] Barriers to Discharge: Continued Medical Work up  Expected Discharge Plan and Services Expected Discharge Plan: [place] arrangements for the past 2 months: Single Family Home                                       Social Determinants of Health (SDOH) Interventions    Readmission Risk Interventions No flowsheet data found.

## 2020-12-26 NOTE — Progress Notes (Addendum)
Received call from safety tech that the pt's dressing was saturated in drainage from her abdominal wound. Tech reported that that pt had noodles and sauce coming from her abdomen. Judson Roch May, RN and nurse tech at bed side with me. Pt denied pain. Removed dressing and observed tan drainage with red (what looked like sauce) and spaghetti noodles from pt's lunch was coming out of surgical site. Dr. Aileen Fass contacted and I was advised to contact IR. IR paged and spoke with [place], [place]. PA advised to make pt NPO for 6-8 hours and then increase diet to clear liquids. Pt began to cough and drainage began to shoot out of surgical site while trying to clean wound. IR contacted again. Sherlie Ban, PA reiterated earlier plan of care and to continue to keep area clean and change dressing. IR also advised that meds might not be a good idea either while NPO since everything is coming out of the site anyway. Dr. Aileen Fass contacted with Livia Snellen, RN (director) to discuss possible need for surgical consult and advised that we would attempt to place a ostomy bag over site due to dressing coming off when pt coughs. Attempted placement of ostomy bag, but pt continued to pull it off. I will continue to clean site and placed clean dressings when needed throughout shift.

## 2020-12-26 NOTE — Progress Notes (Signed)
During shift patient's abdominal dressing was changed 5 times due to dressing being covered with drainage from wound.

## 2020-12-26 NOTE — Consult Note (Signed)
  Courtney Davies is a 45 y.o. female patient admitted with v fib arrest, patient has had a complicated hospital course and was found to have anoxic brain injury.  Psychiatry was consulted for apparent psychotic behavior. Chart review does show she has no previous existing psychiatry history.    MRI of brain performed 8/21 revealed "Areas of bilateral cerebral cortical reduced diffusion. Reduced diffusion along the hippocampi. This is consistent with hypoxic/ischemic injury".   Chart reviewed shows that patient has received Lorazepam 80m IV this morning at 645a. She appears to be responding well at this time, and is observed to be lying in bed in restraints.  Patient does not appear to be currently psychotic, as she is able to follow commands.  She does not appear to be responding to internal stimuli, external stimuli, and or exhibiting delusional thinking.  She appears to be responding well to psychotropic medications that have been initiated to include Haldol, lorazepam, Depakote; all of which are appropriate for disruptive behaviors associated with anoxic brain injury.  -Recommend we continue current medications. -Continue EKG for QTC monitoring, -Will order valproic acid level to be drawn on Thursday morning. -Continue to reassess ongoing need for restraints.  If patient continues to display behaviors that require restraints, consider safety sitter at bedside. -Psychiatry will not follow daily, however will continue to follow until valproic acid level results.  At this present time patient does not meet criteria for inpatient psychiatric admission.

## 2020-12-27 DIAGNOSIS — I469 Cardiac arrest, cause unspecified: Secondary | ICD-10-CM | POA: Diagnosis not present

## 2020-12-27 DIAGNOSIS — G9340 Encephalopathy, unspecified: Secondary | ICD-10-CM | POA: Diagnosis not present

## 2020-12-27 DIAGNOSIS — L03311 Cellulitis of abdominal wall: Secondary | ICD-10-CM | POA: Diagnosis not present

## 2020-12-27 DIAGNOSIS — I5021 Acute systolic (congestive) heart failure: Secondary | ICD-10-CM | POA: Diagnosis not present

## 2020-12-27 LAB — GLUCOSE, CAPILLARY
Glucose-Capillary: 138 mg/dL — ABNORMAL HIGH (ref 70–99)
Glucose-Capillary: 146 mg/dL — ABNORMAL HIGH (ref 70–99)
Glucose-Capillary: 166 mg/dL — ABNORMAL HIGH (ref 70–99)
Glucose-Capillary: 102 mg/dL — ABNORMAL HIGH (ref 70–99)
Glucose-Capillary: 113 mg/dL — ABNORMAL HIGH (ref 70–99)
Glucose-Capillary: 115 mg/dL — ABNORMAL HIGH (ref 70–99)

## 2020-12-27 MED ORDER — SODIUM CHLORIDE 0.9 % IV SOLN
1.0000 g | INTRAVENOUS | Status: AC
  Administered 2020-12-27 – 2020-12-28 (×2): 1 g via INTRAVENOUS
  Filled 2020-12-27 (×2): qty 10

## 2020-12-27 MED ORDER — CEPHALEXIN 500 MG PO CAPS
500.0000 mg | ORAL_CAPSULE | Freq: Three times a day (TID) | ORAL | Status: DC
  Administered 2020-12-29 – 2021-01-01 (×7): 500 mg via ORAL
  Filled 2020-12-27 (×10): qty 1

## 2020-12-27 MED ORDER — VALPROATE SODIUM 100 MG/ML IV SOLN: 500.0000 mg | Freq: Two times a day (BID) | INTRAVENOUS | Status: DC

## 2020-12-27 MED ORDER — AMIODARONE HCL 200 MG PO TABS
200.0000 mg | ORAL_TABLET | Freq: Two times a day (BID) | ORAL | Status: DC
  Administered 2020-12-29 – 2021-01-02 (×6): 200 mg via ORAL
  Filled 2020-12-27 (×7): qty 1

## 2020-12-27 MED ORDER — SODIUM CHLORIDE 0.9 % IV SOLN: INTRAVENOUS | Status: AC

## 2020-12-27 MED ORDER — SODIUM CHLORIDE 0.9 % IV SOLN: INTRAVENOUS | Status: DC

## 2020-12-27 MED ORDER — INSULIN GLARGINE-YFGN 100 UNIT/ML ~~LOC~~ SOLN
10.0000 [IU] | Freq: Two times a day (BID) | SUBCUTANEOUS | Status: DC
  Administered 2020-12-27 – 2021-01-02 (×10): 10 [IU] via SUBCUTANEOUS
  Filled 2020-12-27 (×14): qty 0.1

## 2020-12-27 MED ORDER — DIVALPROEX SODIUM 125 MG PO CSDR
250.0000 mg | DELAYED_RELEASE_CAPSULE | Freq: Three times a day (TID) | ORAL | Status: DC
  Administered 2020-12-29 – 2021-01-01 (×7): 250 mg via ORAL
  Filled 2020-12-27 (×18): qty 2

## 2020-12-27 MED ORDER — VALPROATE SODIUM 100 MG/ML IV SOLN
500.0000 mg | Freq: Two times a day (BID) | INTRAVENOUS | Status: AC
  Administered 2020-12-27 (×2): 500 mg via INTRAVENOUS
  Filled 2020-12-27 (×2): qty 5

## 2020-12-27 NOTE — Progress Notes (Signed)
Went in pt's room to check on her and found her upper body on the floor trying to crawl out of the bed.  She had undone her wrist restraints.  Pt also removed her new IV she just had.  Asked her where she was trying to go, and she responded "nowhere."  She denied falling down the bed or hitting anywhere.  Denied pain. We put her back in bed via hoyer lift. Dr. Venetia Constable and pt's mother made aware.  Courtney Davies. RN

## 2020-12-27 NOTE — Progress Notes (Signed)
[facility] PROGRESS NOTE    Progress Note  OKLA QAZI  MEQ:[id number] DOB: 1975-10-27 DOA: 12/01/2020 PCP: Pcp, No     Brief Narrative:   Courtney Davies is an 45 y.o. female past medical history significant for essential hypertension, vitamin B12 deficiency obstructive sleep apnea admitted to the ICU on 12/01/2020 for A. fib arrest, subsequently patient was extubated on 12/06/2020, there is a concern of anoxic brain injury, CTA of the chest was negative for PE.  2D echo was done that showed mild cardiomyopathy seen by cardiology and recommended further work-up once neurological status is improved.  Transfer out of the ICU and noted to have some dysphagia and cognitive deficit core track was placed started on nutrition dysphagia is predominantly cognitive in nature. Seen by palliative care, and PEG tube placed on 12/13/2020.  Patient continues to play and pull on the PEG tube, IR has been consulted on 12/23/2020 to remove PEG tube. Wound care has been consulted And will probably need skilled nursing facility. On 12/18/2020 he developed fever with a severe leukocytosis and tachycardia subsequently noted to have abdominal wall cellulitis around PEG tube, on 12/25/2020 she pulled out her PEG tube this was discontinued and now she is tolerating her diet, she eats all of her food when she eats with her family.  With the nursing staff she is resistant to eat.     Assessment/Plan:   V. fib arrest with acute respiratory failure with hypoxia and acute encephalopathy: Cardiology deemed not appropriate for ischemic work-up until neurological status improved. Continue amiodarone and Coreg. Discharge planning per [name] to skilled nursing facility.  Abdominal wall cellulitis around PEG tube, Noticed on 12/19/2020 started on IV Rocephin cultures have been negative so far. Infectious disease consulted who recommended to change to Keflex.  We have changed her to IV Rocephin as she is n.p.o. which we  will continue for 2 weeks.  When she is able to take orals we could switch her to oral Keflex to finish her 2-week treatment total. Patient pulled out PEG tube.   IR was consulted and we both agreed to place her n.p.o. the next  24hours start her on IV fluids change her medications to IV. She can be started on a clear liquid diet over the next 24 hours.  Acute systolic heart failure: In the setting of V. fib arrest a 2D echo was done that showed an EF of 40% with global hypokinesia. Continue Coreg and losartan.  Paroxysmal atrial fibrillation: Rate controlled on amiodarone and Coreg.  Transient episode cardiology recommended against long-term anticoagulation.  Thyroid nodule: Follow-up with PCP as an outpatient.  Type 2 diabetes mellitus: With an A1c of 6.9, she is currently n.p.o., continue long-acting insulin and sliding scale insulin for n.p.o. CBGs every 4.  Aspiration pneumonia: Resolved.  Ambulatory dysfunction/deconditioning: Physical therapy was consulted recommended skilled nursing facility.  Anoxic brain injury with severe agitation and behavioral disturbances: Patient came out of her room naked screaming yelling with foul language. Patient continues to be agitated and eating less than 25% of her meals when she is fed by the nursing staff but when she is fed by family she is 100%. Psychiatry recommended difficult sprinkles to room continue Haldol IV as needed. She needs to be off restraints for 24 hours before we get her to a rehab facility.  Nutrition tube feedings: PEG tube has been removed.  Currently n.p.o. to allow time to heal without abdominal wall wound had to she pulled out her  PEG tube. I have discussed with the family they will, and try to feed the patient as she is done well eating with the family.  DVT prophylaxis: lovenox Family Communication:mother Status is: Inpatient  Remains inpatient appropriate because:Hemodynamically unstable  Dispo: The patient  is from: Home              Anticipated d/c is to: SNF              Patient currently is not medically stable to d/c.   Difficult to place patient No    Code Status:     Code Status Orders  (From admission, onward)           Start     Ordered   12/01/20 1304  Full code  Continuous        12/01/20 1305           Code Status History     This patient has a current code status but no historical code status.         IV Access:   Peripheral IV   Procedures and diagnostic studies:   No results found.   Medical Consultants:   None.   Subjective:    [name] incoherent speech  Objective:    Vitals:   12/26/20 2017 12/27/20 0056 12/27/20 0446 12/27/20 0738  BP: 121/74 108/68 103/80 114/89  Pulse: 80 74 84 82  Resp: 18 18 18 17   Temp: 98.8 F (37.1 C) 97.9 F (36.6 C) 98.4 F (36.9 C) 97.6 F (36.4 C)  TempSrc: Oral Axillary Axillary Axillary  SpO2: 100% 98% 97% 100%  Weight:      Height:       SpO2: 100 % O2 Flow Rate (L/min): 2 L/min FiO2 (%): 29 %   Intake/Output Summary (Last 24 hours) at 12/27/2020 1159 Last data filed at 12/26/2020 1254 Gross per 24 hour  Intake 240 ml  Output --  Net 240 ml    Filed Weights   12/24/20 0423 12/25/20 0421 12/26/20 0500  Weight: 94.8 kg 95.3 kg 94.5 kg    Exam: General exam: In no acute distress. Respiratory system: Good air movement and clear to auscultation. Cardiovascular system: S1 & S2 heard, RRR. No JVD. Gastrointestinal system: Abdomen is nondistended, soft and nontender.  Extremities: No pedal edema. Skin: No rashes, lesions or ulcers  Data Reviewed:    Labs: Basic Metabolic Panel: Recent Labs  Lab 12/21/20 0611  NA 133*  K 4.3  CL 103  CO2 21*  GLUCOSE 121*  BUN 21*  CREATININE 0.60  CALCIUM 8.8*    GFR Estimated Creatinine Clearance: 104 mL/min (by C-G formula based on SCr of 0.6 mg/dL). Liver Function Tests: No results for input(s): AST, ALT, ALKPHOS,  BILITOT, PROT, ALBUMIN in the last 168 hours.  No results for input(s): LIPASE, AMYLASE in the last 168 hours. No results for input(s): AMMONIA in the last 168 hours. Coagulation profile No results for input(s): INR, PROTIME in the last 168 hours. COVID-19 Labs  No results for input(s): DDIMER, FERRITIN, LDH, CRP in the last 72 hours.  Lab Results  Component Value Date   SARSCOV2NAA POSITIVE (A) 12/15/2020   SARSCOV2NAA POSITIVE (A) 12/01/2020    CBC: Recent Labs  Lab 12/21/20 0611 12/26/20 1356  WBC 16.5* 9.8  NEUTROABS  --  5.6  HGB 10.2* 11.4*  HCT 32.2* 35.9*  MCV 69.8* 71.9*  PLT 390 451*    Cardiac Enzymes: No results for input(s):  CKTOTAL, CKMB, CKMBINDEX, TROPONINI in the last 168 hours. BNP (last 3 results) No results for input(s): PROBNP in the last 8760 hours. CBG: Recent Labs  Lab 12/26/20 1716 12/26/20 2222 12/27/20 0103 12/27/20 0450 12/27/20 0740  GLUCAP 154* 132* 138* 146* 166*    D-Dimer: No results for input(s): DDIMER in the last 72 hours. Hgb A1c: No results for input(s): HGBA1C in the last 72 hours. Lipid Profile: No results for input(s): CHOL, HDL, LDLCALC, TRIG, CHOLHDL, LDLDIRECT in the last 72 hours. Thyroid function studies: No results for input(s): TSH, T4TOTAL, T3FREE, THYROIDAB in the last 72 hours.  Invalid input(s): FREET3 Anemia work up: No results for input(s): VITAMINB12, FOLATE, FERRITIN, TIBC, IRON, RETICCTPCT in the last 72 hours. Sepsis Labs: Recent Labs  Lab 12/21/20 0611 12/26/20 1356  WBC 16.5* 9.8    Microbiology Recent Results (from the past 240 hour(s))  Culture, blood (routine x 2)     Status: None   Collection Time: 12/18/20  7:26 AM   Specimen: BLOOD  Result Value Ref Range Status   Specimen Description BLOOD SITE NOT SPECIFIED  Final   Special Requests AEROBIC BOTTLE ONLY Blood Culture adequate volume  Final   Culture   Final    NO GROWTH 5 DAYS Performed at [facility], [address] [place], [place] [address]    Report Status 12/23/2020 FINAL  Final  Culture, blood (routine x 2)     Status: None   Collection Time: 12/18/20  7:26 AM   Specimen: BLOOD  Result Value Ref Range Status   Specimen Description BLOOD SITE NOT SPECIFIED  Final   Special Requests   Final    AEROBIC BOTTLE ONLY Blood Culture results may not be optimal due to an inadequate volume of blood received in culture bottles   Culture   Final    NO GROWTH 5 DAYS Performed at [facility], [place] [address] [place], [place] [address]    Report Status 12/23/2020 FINAL  Final     Medications:    acetaminophen (TYLENOL) oral liquid 160 mg/5 mL  650 mg Oral Q6H   amiodarone  200 mg Per Tube BID   carvedilol  6.25 mg Per Tube BID WC   divalproex  250 mg Oral Q8H   enoxaparin (LOVENOX) injection  40 mg Subcutaneous Q24H   feeding supplement (PROSource TF)  45 mL Per Tube TID   haloperidol lactate  5 mg Intravenous BID   insulin aspart  0-20 Units Subcutaneous Q4H   insulin glargine-yfgn  25 Units Subcutaneous BID   LORazepam  2 mg Intramuscular Once   losartan  25 mg Per Tube Daily   nicotine  21 mg Transdermal Daily   pantoprazole  40 mg Oral BID   sodium chloride flush  10-40 mL Intracatheter Q12H   Continuous Infusions:  valproate sodium        LOS: 26 days   Charlynne Cousins  [facility]  12/27/2020, 11:59 AM

## 2020-12-27 NOTE — TOC Progression Note (Signed)
Transition of Care [facility]) - Progression Note    Patient Details  Name: Courtney Davies MRN: [id number] Date of Birth: 08-13-1975  Transition of Care [facility]) CM/SW [place], [place] Phone Number: 12/27/2020, 1:50 PM  Clinical Narrative:     Urban Gibson with CIR confirmed with CSW they will follow up with CSW tomorrow after speaking with Dr. Naaman Plummer to see if patient is appropriate for CIR. CSW will continue to follow and assist with patients dc planning needs.  Expected Discharge Plan: [place] Barriers to Discharge: Continued Medical Work up  Expected Discharge Plan and Services Expected Discharge Plan: [place] arrangements for the past 2 months: Single Family Home                                       Social Determinants of Health (SDOH) Interventions    Readmission Risk Interventions No flowsheet data found.

## 2020-12-27 NOTE — Progress Notes (Signed)
Pt trying to get out of the bed. Pulling lines. Removing dressing from wound site. Notified provider. Restraints was order and pt monitor with sitter in room.

## 2020-12-27 NOTE — Progress Notes (Signed)
Physical Therapy Treatment Patient Details Name: Courtney Davies MRN: [id number] DOB: Jan 28, 1976 Today's Date: 12/27/2020   History of Present Illness Pt is a 45 y/o female admitted 12/01/20 with Vfib arrest with ROSC; pt also (+) COVID. CTA negative for PE. MRI 8/21 consistent with hypoxic/ischemic injury. 8/21 RLL infiltrate seen on CXR from aspiration pna. Intubated 8/18-23 (failed initial attempt to extubate 8/19). S/p PEG placement 8/30. PMH includes B12 deficiency, DM, OA, bronchitis, dysphagia.    PT Comments    Pt was seen for progression of tx to side of bed, then standing and a few sidesteps, then back to bed.  Her family arrived as PT was assisting her to sit, and the pt was able to go through some photos family had there and name the family members in them with help.  Pt was fairly calm and was able to be assisted to stand, then sidestepped finally a few steps.  Her family began to be more energetic and talking louder, in which pt began to respond with agitation.  Asked them not to be too stimulating, and she was finally assisted back to bed and in restraints.  Pt immediately closed her eyes, very tired from the focused effort.  Recommend SNF placement as pt is just beginning her rehab process, and will be in need of time and repetition for recovery of her independence.     Recommendations for follow up therapy are one component of a multi-disciplinary discharge planning process, led by the attending physician.  Recommendations may be updated based on patient status, additional functional criteria and insurance authorization.  Follow Up Recommendations  SNF     Equipment Recommendations  Wheelchair (measurements PT);Wheelchair cushion (measurements PT);Hospital bed;Other (comment)    Recommendations for Other Services       Precautions / Restrictions Precautions Precautions: Fall Precaution Comments: incontinence, wound abd with drainage Required Braces or Orthoses: Other  Brace Other Brace: bil prevalon at rest Restrictions Weight Bearing Restrictions: No Other Position/Activity Restrictions: restraints on both arms and legs by nsg     Mobility  Bed Mobility Overal bed mobility: Needs Assistance Bed Mobility: Supine to Sit;Sit to Supine Rolling: Min assist Sidelying to sit: Min assist Supine to sit: Min assist Sit to supine: Min assist Sit to sidelying: Min assist General bed mobility comments: pt is willing to assist today    Transfers Overall transfer level: Needs assistance Equipment used: Rolling walker (2 wheeled);1 person hand held assist Transfers: Sit to/from Stand Sit to Stand: Mod assist         General transfer comment: mod to power up mult times with repetitive cues for hand placement and control of balance  Ambulation/Gait Ambulation/Gait assistance: Min assist;Mod assist Gait Distance (Feet): 4 Feet Assistive device: Rolling walker (2 wheeled);1 person hand held assist Gait Pattern/deviations: Step-to pattern;Decreased stride length;Wide base of support;Trunk flexed Gait velocity: reduced Gait velocity interpretation: <1.8 ft/sec, indicate of risk for recurrent falls General Gait Details: pt was briefly willing to let PT help her walk sidestepping on side of bed toward [place]             Wheelchair Mobility    Modified Rankin (Stroke Patients Only)       Balance Overall balance assessment: Needs assistance Sitting-balance support: No upper extremity supported;Feet supported Sitting balance-Leahy Scale: Fair   Postural control: Posterior lean Standing balance support: Bilateral upper extremity supported;During functional activity Standing balance-Leahy Scale: Poor  Cognition Arousal/Alertness: Awake/alert Behavior During Therapy: Anxious;Impulsive Overall Cognitive Status: Impaired/Different from baseline Area of Impairment: Problem  solving;Awareness;Following commands;Safety/judgement;Memory;Attention;Orientation                 Orientation Level: Time;Situation Current Attention Level: Selective Memory: Decreased recall of precautions;Decreased short-term memory Following Commands: Follows one step commands inconsistently;Follows one step commands with increased time Safety/Judgement: Decreased awareness of safety;Decreased awareness of deficits Awareness: Intellectual Problem Solving: Slow processing;Decreased initiation;Difficulty sequencing;Requires verbal cues;Requires tactile cues General Comments: pt is initially calmer and agreed to sit on side of bed but as session progressed became more distracted and mildly agitated      Exercises      General Comments General comments (skin integrity, edema, etc.): Pt is initially calm with family arriving and looked at pictures on side of bed.  Family sat with her then PT talked about trying to stand on walker.  Family energy got a bit high, talked with them about pt needing a calmer setup as she began to talk louder and get agitated about being left alone.      Pertinent Vitals/Pain Pain Assessment: Faces Faces Pain Scale: Hurts a little bit Pain Location: legs Pain Descriptors / Indicators: Guarding    Home Living                      Prior Function            PT Goals (current goals can now be found in the care plan section) Acute Rehab PT Goals Patient Stated Goal: none stated Progress towards PT goals: Progressing toward goals    Frequency    Min 2X/week      PT Plan Current plan remains appropriate    Co-evaluation              AM-PAC PT "6 Clicks" Mobility   Outcome Measure  Help needed turning from your back to your side while in a flat bed without using bedrails?: A Little Help needed moving from lying on your back to sitting on the side of a flat bed without using bedrails?: A Little Help needed moving to and from  a bed to a chair (including a wheelchair)?: A Little Help needed standing up from a chair using your arms (e.g., wheelchair or bedside chair)?: A Lot Help needed to walk in hospital room?: A Lot Help needed climbing 3-5 steps with a railing? : Total 6 Click Score: 14    End of Session   Activity Tolerance: Treatment limited secondary to medical complications (Comment);Treatment limited secondary to agitation (agitated by high energy of family suddenly, and then calmer when that stopped) Patient left: in bed;with call bell/phone within reach;with bed alarm set Nurse Communication: Mobility status PT Visit Diagnosis: Other abnormalities of gait and mobility (R26.89);Other symptoms and signs involving the nervous system (R29.898);Muscle weakness (generalized) (M62.81);Difficulty in walking, not elsewhere classified (R26.2)     Time: 3524-8185 PT Time Calculation (min) (ACUTE ONLY): 49 min  Charges:  $Therapeutic Activity: 23-37 mins $Neuromuscular Re-education: 8-22 mins                Ramond Dial 12/27/2020, 3:39 PM  Mee Hives, PT MS Acute Rehab Dept. Number: [place] and [place]

## 2020-12-27 NOTE — Progress Notes (Signed)
Nutrition Follow-up  DOCUMENTATION CODES:   Not applicable  INTERVENTION:   -RD will follow for diet advancement and add supplement as appropriate  NUTRITION DIAGNOSIS:   Inadequate oral intake related to acute illness as evidenced by NPO status.  Ongoing  GOAL:   Patient will meet greater than or equal to 90% of their needs  Unmet  MONITOR:   PO intake, Supplement acceptance, Diet advancement, Labs, Skin, I & O's  REASON FOR ASSESSMENT:   Ventilator    ASSESSMENT:   45 yo female admitted post Vfib arrest, COVID+. PMH includes DM, GERD, dysphagia, B12 def  8/30- PEG placed, advanced to clear liquid diet 9/6- advanced to regular diet with thin liquids 9/8- restraints added due to pt pulling at PEG site 9/10- abdominal binder placed due to pt pulling at PEG sute 9/11- pt pulled out PEG  Reviewed I/O's: +480 ml x 24 hours and +10.9 L since 12/13/20  Pt currently NPO. Per RN notes, pt with significant drainage out of PEG site yesterday and some of the output appeared to look like food (spaghetti and noodles). IR recommending NPO status and slow advancement of diet; surgical intervention may be warranted if no improvement.   Per chart review, pt and family do not want PEG. Pt family has been bringing outside food for pt to eat and she does well with eating when family is present. Pt is hesitant to take medications of eat when family is not present.   Medications reviewed and include ativan and 0.9% sodium chloride infusion @ 125 ml/hr.   Labs reviewed: CBGS: 102-166 (inpatient orders for glycemic control are 0-20 units insulin aspart every 4 hours and 10 units insulin glargine-yfgn BID).    Diet Order:   Diet Order             Diet NPO time specified  Diet effective now                   EDUCATION NEEDS:   Not appropriate for education at this time  Skin:  Skin Assessment: Skin Integrity Issues: Skin Integrity Issues:: Incisions, Other  (Comment) Incisions: umbilicus Other: MASD to buttocks  Last BM:  12/24/20  Height:   Ht Readings from Last 1 Encounters:  12/01/20 5' 6"  (1.676 m)    Weight:   Wt Readings from Last 1 Encounters:  12/26/20 94.5 kg   BMI:  Body mass index is 33.63 kg/m.  Estimated Nutritional Needs:   Kcal:  1850-2050 kcals  Protein:  100-120 g  Fluid:  >/= 1.8 L    Loistine Chance, RD, LDN, [place] Registered Dietitian II Certified Diabetes Care and Education Specialist Please refer to [facility] for RD and/or RD on-call/weekend/after hours pager

## 2020-12-28 DIAGNOSIS — L03311 Cellulitis of abdominal wall: Secondary | ICD-10-CM | POA: Diagnosis not present

## 2020-12-28 DIAGNOSIS — I469 Cardiac arrest, cause unspecified: Secondary | ICD-10-CM | POA: Diagnosis not present

## 2020-12-28 DIAGNOSIS — I5021 Acute systolic (congestive) heart failure: Secondary | ICD-10-CM | POA: Diagnosis not present

## 2020-12-28 LAB — BASIC METABOLIC PANEL
Anion gap: 10 (ref 5–15)
BUN: 16 mg/dL (ref 6–20)
CO2: 22 mmol/L (ref 22–32)
Calcium: 9.1 mg/dL (ref 8.9–10.3)
Chloride: 106 mmol/L (ref 98–111)
Creatinine, Ser: 0.92 mg/dL (ref 0.44–1.00)
GFR, Estimated: >60 mL/min (ref >60)
Glucose, Bld: 118 mg/dL — ABNORMAL HIGH (ref 70–99)
Potassium: 4.1 mmol/L (ref 3.5–5.1)
Sodium: 138 mmol/L (ref 135–145)

## 2020-12-28 LAB — GLUCOSE, CAPILLARY
Glucose-Capillary: 128 mg/dL — ABNORMAL HIGH (ref 70–99)
Glucose-Capillary: 115 mg/dL — ABNORMAL HIGH (ref 70–99)
Glucose-Capillary: 122 mg/dL — ABNORMAL HIGH (ref 70–99)
Glucose-Capillary: 87 mg/dL (ref 70–99)
Glucose-Capillary: 129 mg/dL — ABNORMAL HIGH (ref 70–99)
Glucose-Capillary: 95 mg/dL (ref 70–99)

## 2020-12-28 MED ORDER — DEXTROSE-NACL 5-0.45 % IV SOLN: INTRAVENOUS | Status: DC

## 2020-12-28 MED ORDER — DEXTROSE 50 % IV SOLN
12.5000 g | Freq: Once | INTRAVENOUS | Status: AC
  Administered 2020-12-28: 12.5 g via INTRAVENOUS
  Filled 2020-12-28: qty 50

## 2020-12-28 NOTE — Progress Notes (Signed)
PROGRESS NOTE  Courtney Davies WER:[id number] DOB: 1975-06-03 DOA: 12/01/2020 PCP: Pcp, No  HPI/Recap of past 24 hours: Courtney Davies is an 45 y.o. female past medical history significant for essential hypertension, vitamin B12 deficiency obstructive sleep apnea admitted to the ICU on 12/01/2020 for V. fib arrest, subsequently patient was extubated on 12/06/2020, there is a concern of anoxic brain injury, CTA of the chest was negative for PE.  2D echo was done that showed mild cardiomyopathy seen by cardiology and recommended further work-up once neurological status is improved.  Transfer out of the ICU and noted to have some dysphagia and cognitive deficit, core track was placed started on nutrition. Seen by palliative care, and PEG tube placed on 12/13/2020.  Patient continues to pull on the PEG tube, IR has been consulted on 12/23/2020 to remove PEG tube. On 12/18/2020 she developed fever with a severe leukocytosis and tachycardia, subsequently noted to have abdominal wall cellulitis around PEG tube, on 12/25/2020 she pulled out her PEG tube this was discontinued.    Today, pt continues to be confused, still requiring restraints. Was able to tell me her name.   Assessment/Plan: Principal Problem:   Fever Active Problems:   Cardiac arrest (HCC)   Type 1 diabetes mellitus without complication (HCC)   Hypertension   GERD (gastroesophageal reflux disease)   Anemia   Encephalopathy acute   Acute systolic heart failure (HCC)   Dysphagia   Goals of care, counseling/discussion   Abdominal wall cellulitis   V. fib arrest with acute respiratory failure with hypoxia and acute encephalopathy: Cardiology deemed not appropriate for ischemic work-up until neurological status improved. Continue amiodarone and Coreg Discharge planning per TOC to skilled nursing facility  Abdominal wall cellulitis around PEG tube Noticed on 12/19/2020 started on IV Rocephin cultures have been negative so  far. Infectious disease consulted who recommended to change to Keflex.  We have changed her to IV Rocephin as she is n.p.o. which we will continue for 2 weeks.  When she is able to take orals we could switch her to oral Keflex to finish her 2-week treatment total IR was consulted and we both agreed to place her n.p.o. the next  24hours start her on IV fluids change her medications to IV Start CLD   Acute systolic heart failure In the setting of V. fib arrest a 2D echo was done that showed an EF of 40% with global hypokinesia. Continue Coreg and losartan.  Paroxysmal atrial fibrillation Rate controlled on amiodarone and Coreg Transient episode cardiology recommended against long-term anticoagulation.  Thyroid nodule: Follow-up with PCP as an outpatient.  Type 2 diabetes mellitus A1c of 6.9 Continue long-acting insulin and sliding scale insulin CBGs every 4H   Aspiration pneumonia Resolved.  Ambulatory dysfunction/deconditioning Physical therapy was consulted recommended skilled nursing facility.   Anoxic brain injury with severe agitation and behavioral disturbances Patient continues to be agitated and eating less than 25% of her meals when she is fed by the nursing staff but when she is fed by family she is 100%. Psychiatry recommended depakote sprinkles, continue Haldol IV as needed. She needs to be off restraints for 24 hours before we get her to a rehab facility.   Nutrition tube feedings PEG tube has been removed Family will try to feed the patient as she is dose well eating with the family.    Malnutrition Type:  Nutrition Problem: Inadequate oral intake Etiology: acute illness   Malnutrition Characteristics:  Signs/Symptoms: NPO status  Nutrition Interventions:  Interventions: Tube feeding    Estimated body mass index is 33.63 kg/m as calculated from the following:   Height as of this encounter: 5' 6"  (1.676 m).   Weight as of this encounter: 94.5 kg.      Code Status: Full  Family Communication: None at bedside  Disposition Plan: Status is: Inpatient  Remains inpatient appropriate because:Inpatient level of care appropriate due to severity of illness  Dispo: The patient is from: Home              Anticipated d/c is to: SNF              Patient currently is not medically stable to d/c.   Difficult to place patient Yes     Consultants: PCCM Cardiology  Procedures: None  Antimicrobials: Rocephin  DVT prophylaxis:  lovenox   Objective: Vitals:   12/27/20 1243 12/27/20 1715 12/27/20 2110 12/28/20 1534  BP: 120/77 120/84 108/73 109/81  Pulse: 84 80 83   Resp: 19 18 18 18   Temp: 98 F (36.7 C) 98.7 F (37.1 C) 98.9 F (37.2 C) 98.1 F (36.7 C)  TempSrc: Oral Oral Oral Oral  SpO2: 99% 97% 98% 98%  Weight:      Height:        Intake/Output Summary (Last 24 hours) at 12/28/2020 1920 Last data filed at 12/28/2020 0500 Gross per 24 hour  Intake 0 ml  Output --  Net 0 ml   Filed Weights   12/24/20 0423 12/25/20 0421 12/26/20 0500  Weight: 94.8 kg 95.3 kg 94.5 kg    Exam: General: NAD  Cardiovascular: S1, S2 present Respiratory: CTAB Abdomen: Soft, nontender, nondistended, bowel sounds present, prior PEG tube site noted Musculoskeletal: No bilateral pedal edema noted Skin: Normal Psychiatry: Normal mood     Data Reviewed: CBC: Recent Labs  Lab 12/26/20 1356  WBC 9.8  NEUTROABS 5.6  HGB 11.4*  HCT 35.9*  MCV 71.9*  PLT 597*   Basic Metabolic Panel: Recent Labs  Lab 12/28/20 0041  NA 138  K 4.1  CL 106  CO2 22  GLUCOSE 118*  BUN 16  CREATININE 0.92  CALCIUM 9.1   GFR: Estimated Creatinine Clearance: 90.4 mL/min (by C-G formula based on SCr of 0.92 mg/dL). Liver Function Tests: No results for input(s): AST, ALT, ALKPHOS, BILITOT, PROT, ALBUMIN in the last 168 hours. No results for input(s): LIPASE, AMYLASE in the last 168 hours. No results for input(s): AMMONIA in the last 168  hours. Coagulation Profile: No results for input(s): INR, PROTIME in the last 168 hours. Cardiac Enzymes: No results for input(s): CKTOTAL, CKMB, CKMBINDEX, TROPONINI in the last 168 hours. BNP (last 3 results) No results for input(s): PROBNP in the last 8760 hours. HbA1C: No results for input(s): HGBA1C in the last 72 hours. CBG: Recent Labs  Lab 12/28/20 0115 12/28/20 0525 12/28/20 0826 12/28/20 1212 12/28/20 1602  GLUCAP 128* 115* 122* 87 129*   Lipid Profile: No results for input(s): CHOL, HDL, LDLCALC, TRIG, CHOLHDL, LDLDIRECT in the last 72 hours. Thyroid Function Tests: No results for input(s): TSH, T4TOTAL, FREET4, T3FREE, THYROIDAB in the last 72 hours. Anemia Panel: No results for input(s): VITAMINB12, FOLATE, FERRITIN, TIBC, IRON, RETICCTPCT in the last 72 hours. Urine analysis:    Component Value Date/Time   COLORURINE YELLOW 12/17/2020 0957   APPEARANCEUR HAZY (A) 12/17/2020 0957   LABSPEC 1.023 12/17/2020 0957   PHURINE 5.0 12/17/2020 0957   GLUCOSEU 50 (A) 12/17/2020 0957  HGBUR NEGATIVE 12/17/2020 0957   BILIRUBINUR NEGATIVE 12/17/2020 0957   KETONESUR NEGATIVE 12/17/2020 0957   PROTEINUR 30 (A) 12/17/2020 0957   NITRITE NEGATIVE 12/17/2020 0957   LEUKOCYTESUR NEGATIVE 12/17/2020 0957   Sepsis Labs: @LABRCNTIP (procalcitonin:4,lacticidven:4)  )No results found for this or any previous visit (from the past 240 hour(s)).    Studies: No results found.  Scheduled Meds:  acetaminophen (TYLENOL) oral liquid 160 mg/5 mL  650 mg Oral Q6H   amiodarone  200 mg Oral BID   carvedilol  6.25 mg Per Tube BID WC   [START ON 12/29/2020] cephALEXin  500 mg Oral Q8H   divalproex  250 mg Oral Q8H   enoxaparin (LOVENOX) injection  40 mg Subcutaneous Q24H   feeding supplement (PROSource TF)  45 mL Per Tube TID   haloperidol lactate  5 mg Intravenous BID   insulin aspart  0-20 Units Subcutaneous Q4H   insulin glargine-yfgn  10 Units Subcutaneous BID   LORazepam  2  mg Intramuscular Once   losartan  25 mg Per Tube Daily   nicotine  21 mg Transdermal Daily   pantoprazole  40 mg Oral BID   sodium chloride flush  10-40 mL Intracatheter Q12H    Continuous Infusions:   LOS: 27 days     Alma Friendly, MD [facility]  If 7PM-7AM, please contact night-coverage [email or web address] 12/28/2020, 7:20 PM

## 2020-12-28 NOTE — Progress Notes (Signed)
Inpatient Rehab Admissions Coordinator:   Reviewed case with Dr. Naaman Plummer.  At this time we are recommending a CIR consult and I will place an order per our protocol.  Shann Medal, PT, DPT Admissions Coordinator [phone] 12/28/20  12:01 PM

## 2020-12-28 NOTE — Progress Notes (Addendum)
Occupational Therapy Treatment Patient Details Name: Courtney Davies MRN: [id number] DOB: 1975-08-16 Today's Date: 12/28/2020   History of present illness Pt is a 45 y/o female admitted 12/01/20 with Vfib arrest with ROSC; pt also (+) COVID. CTA negative for PE. MRI 8/21 consistent with hypoxic/ischemic injury. 8/21 RLL infiltrate seen on CXR from aspiration pna. Intubated 8/18-23 (failed initial attempt to extubate 8/19). S/p PEG placement 8/30. PMH includes B12 deficiency, DM, OA, bronchitis, dysphagia.   OT comments  Session focused on OOB ADLs with pt progressing well. Pt able to demo improved ADL participation with quiet environment and multimodal cueing with one step commands. Pt able to demo LB dressing and ADLs standing at sink with overall Mod A with consistent cueing. Pt demonstrated mobility to/from bathroom using RW at Mod A x 2 (for safety). When given choices and with repetition, pt able to choose correct answers (location, affected areas of body, etc). Based on progress today, will increase frequency to 2x/wk and update DC recs to CIR with 24/7 supervision/assist from family.    Recommendations for follow up therapy are one component of a multi-disciplinary discharge planning process, led by the attending physician.  Recommendations may be updated based on patient status, additional functional criteria and insurance authorization.    Follow Up Recommendations  CIR;Supervision/Assistance - 24 hour    Equipment Recommendations  Other (comment) (Rolling walker)    Recommendations for Other Services      Precautions / Restrictions Precautions Precautions: Fall Precaution Comments: incontinence, abdominal wound w/ drainage (from PEG tube) Restrictions Weight Bearing Restrictions: No       Mobility Bed Mobility Overal bed mobility: Needs Assistance Bed Mobility: Supine to Sit;Sit to Supine     Supine to sit: Mod assist Sit to supine: Mod assist   General bed mobility  comments: Mod A overall with tactile cues to initiate advancing LEs on/off bed and lift trunk. Pt able to bridge to assist with pad mgmt    Transfers Overall transfer level: Needs assistance Equipment used: Rolling walker (2 wheeled);1 person hand held assist Transfers: Sit to/from Stand Sit to Stand: Mod assist         General transfer comment: Mod A for power up from bedside with RW and from [facility] in bathroom    Balance Overall balance assessment: Needs assistance Sitting-balance support: No upper extremity supported;Feet supported Sitting balance-Leahy Scale: Fair     Standing balance support: Bilateral upper extremity supported;During functional activity Standing balance-Leahy Scale: Poor                             ADL either performed or assessed with clinical judgement   ADL Overall ADL's : Needs assistance/impaired     Grooming: Moderate assistance;Standing;Wash/dry hands Grooming Details (indicate cue type and reason): Overall Mod A with step by step cueing to sequence task. Pt able to open soap bottle, place on hands and lather with verbal cues. noted to look towards stack of linens in anticipation to dry hands             Lower Body Dressing: Moderate assistance;Sitting/lateral leans Lower Body Dressing Details (indicate cue type and reason): Mod A overall to don socks sitting EOB with max verbal cues via backward chaining. Assistance to cross LEs sitting EOB and don around tip of toes to assist in initiating/completion of task. pt able to pull up socks from this position with verbal cues Toilet Transfer: Moderate assistance;+2 for safety/equipment;Ambulation;RW;BSC  Toilet Transfer Details (indicate cue type and reason): Guided pt in mobility to/from bathroom with Min -Mod A using RW and frequent directional cues. Pt initially would not sit on regular toilet - likely due to seeing BSC in front of her in shower. Attempted to have pt sit on BSC with max cues  and placing hands on armrests - increased time needed for pt to fully sit on [facility]           General ADL Comments: Pt progressing with ADLs and OOB activities, benefits from quiet environment and multimodal cues     Vision   Vision Assessment?: Vision impaired- to be further tested in functional context   Perception     Praxis      Cognition Arousal/Alertness: Awake/alert Behavior During Therapy: Flat affect Overall Cognitive Status: Impaired/Different from baseline Area of Impairment: Problem solving;Awareness;Following commands;Safety/judgement;Memory;Attention;Orientation                 Orientation Level: Disoriented to;Time;Situation;Place Current Attention Level: Sustained Memory: Decreased recall of precautions;Decreased short-term memory Following Commands: Follows one step commands inconsistently;Follows one step commands with increased time Safety/Judgement: Decreased awareness of safety;Decreased awareness of deficits Awareness: Intellectual Problem Solving: Slow processing;Decreased initiation;Difficulty sequencing;Requires verbal cues;Requires tactile cues General Comments: When given choices, pt able to identify being at hospital and with teachback method. Pt benefits from multimodal cues and with cues, able to follow one step commands in non-distracting environment. Pt able to look to persons/objects in anticipation of sequencing but slow motor planning and processing still evident. At end of session, pt noted to look at tv and report seeing her family on tv talking (judge show)        Exercises     Shoulder Instructions       General Comments Sitter present to assist in changing linen while pt out of bed.    Pertinent Vitals/ Pain       Pain Assessment: Faces Faces Pain Scale: No hurt Pain Intervention(s): Monitored during session  Home Living                                          Prior Functioning/Environment               Frequency  Min 2X/week        Progress Toward Goals  OT Goals(current goals can now be found in the care plan section)  Progress towards OT goals: Progressing toward goals  Acute Rehab OT Goals Patient Stated Goal: none stated OT Goal Formulation: Patient unable to participate in goal setting Time For Goal Achievement: 01/03/21 Potential to Achieve Goals: Fair ADL Goals Pt Will Perform Grooming: with supervision;standing Pt Will Perform Upper Body Bathing: with min assist;sitting;standing Pt Will Transfer to Toilet: with min assist;ambulating Pt/caregiver will Perform Home Exercise Program: Increased strength;Both right and left upper extremity;With Supervision;With written HEP provided Additional ADL Goal #1: Pt will follow 2 step commands with 50% accuracy to maximize ADL participation Additional ADL Goal #2: Pt will complete bed mobility with Supervision in prep for ADLs  Plan Discharge plan needs to be updated;Frequency needs to be updated    Co-evaluation                 AM-PAC OT "6 Clicks" Daily Activity     Outcome Measure   Help from another person eating meals?: A Lot Help from another person  taking care of personal grooming?: A Lot Help from another person toileting, which includes using toliet, bedpan, or urinal?: Total Help from another person bathing (including washing, rinsing, drying)?: A Lot Help from another person to put on and taking off regular upper body clothing?: A Lot Help from another person to put on and taking off regular lower body clothing?: A Lot 6 Click Score: 11    End of Session Equipment Utilized During Treatment: Gait belt;Rolling walker  OT Visit Diagnosis: Other abnormalities of gait and mobility (R26.89);Muscle weakness (generalized) (M62.81);Other symptoms and signs involving the nervous system (R29.898);Other symptoms and signs involving cognitive function   Activity Tolerance Patient tolerated treatment well   Patient  Left in bed;with call bell/phone within reach;with restraints reapplied;with nursing/sitter in room   Nurse Communication          Time: 0149-9692 OT Time Calculation (min): 24 min  Charges: OT General Charges $OT Visit: 1 Visit OT Treatments $Self Care/Home Management : 23-37 mins  Malachy Chamber, OTR/L Acute Rehab Services Office: [phone]   Courtney Davies 12/28/2020, 3:35 PM

## 2020-12-28 NOTE — Progress Notes (Signed)
  Speech Language Pathology Treatment: Cognitive-Linquistic  Patient Details Name: JENEA DAKE MRN: [id number] DOB: 1975/08/21 Today's Date: 12/28/2020 Time: 1388-7195 SLP Time Calculation (min) (ACUTE ONLY): 15 min  Assessment / Plan / Recommendation Clinical Impression  Pt demonstrates significant improvement in sustained attention since last session. Pt able to participate in joint attention task with therapist for 5 minutes.  Auditory comprehension of simple phrases good, but pt with significant word finding difficulties. Easily agitated due to distraction in the environment and with frustration about situation. Pt is suspicious and needs reasoning for tasks given. Positive affirmation and active listening to pt frustration helpful when following pts train of thought, generally revolving around, why am I here, what do I have to do to leave? Pt eager to participate in physical therapy to address strength and mobility to work towards d/c. Pt follows one step commands if reasoning given. For expression of wants and needs provide Y/N questions and simple choices rather than open ended questions. Seems to be experiencing improved therapy tolerance. Recommend CIR.    HPI HPI: Pt is a 45 y/o female admitted 8/18 with Vfib arrest with ROSC, COVID. CTA negative for PE. ETT 8/18-23 (failed inital attempt to extubate 8/19). MRI 8/21 consistent with hypoxic/ischemic injury. CXR 8/21 RLL infiltrate. PMT 8/23: continued aggressive care; family very hopeful for continued improvement. PEG 8/30. PMH: B12 deficiency, DM, OA, bronchitis.      SLP Plan  Continue with current plan of care       Recommendations                   Plan: Continue with current plan of care       GO                Crystalyn Delia, Katherene Ponto 12/28/2020, 2:01 PM

## 2020-12-28 NOTE — TOC Progression Note (Signed)
Transition of Care [facility]) - Progression Note    Patient Details  Name: Courtney Davies MRN: [id number] Date of Birth: [birth year]  Transition of Care [facility]) CM/SW [place], [place] Phone Number: 12/28/2020, 12:02 PM  Clinical Narrative:     CSW received call from [facility] with CIR and Urban Gibson confirmed she will put in order for CIR consult. CIR currently following. CSW will continue to follow and assist with patient dc needs.  Expected Discharge Plan: [place] Barriers to Discharge: Continued Medical Work up  Expected Discharge Plan and Services Expected Discharge Plan: [place] arrangements for the past 2 months: Single Family Home                                       Social Determinants of Health (SDOH) Interventions    Readmission Risk Interventions No flowsheet data found.

## 2020-12-28 NOTE — Progress Notes (Signed)
Inpatient Rehab Admissions Coordinator:   Met with patient at bedside and called her sister, [name], to discuss CIR.  Pt is able to sustain attention to our conversation, respond to questions somewhat appropriately, and follow simple commands.  She is emotional, wanting to leave the hospital, but fairly easily redirected.  She is open to rehab "so I can go home".  I also spoke to her sister on the phone and explained 3 hrs/day of therapy, MD followup, and average length of stay about 2 weeks (dependent upon progress).  She states family would be very agreeable to potential CIR admission and they have already planned for pt to move into her mother's house when she ultimately comes home (pt not aware of this plan yet).  I will need to get insurance authorization for CIR and I would like to see if PT feels comfortable updating their recommendations to CIR when they see patient next.  Will follow.   Shann Medal, PT, DPT Admissions Coordinator [phone] 12/28/20  3:46 PM

## 2020-12-29 DIAGNOSIS — L03311 Cellulitis of abdominal wall: Secondary | ICD-10-CM | POA: Diagnosis not present

## 2020-12-29 DIAGNOSIS — I5021 Acute systolic (congestive) heart failure: Secondary | ICD-10-CM | POA: Diagnosis not present

## 2020-12-29 DIAGNOSIS — I469 Cardiac arrest, cause unspecified: Secondary | ICD-10-CM | POA: Diagnosis not present

## 2020-12-29 LAB — BASIC METABOLIC PANEL
Anion gap: 8 (ref 5–15)
BUN: 18 mg/dL (ref 6–20)
CO2: 22 mmol/L (ref 22–32)
Calcium: 8.8 mg/dL — ABNORMAL LOW (ref 8.9–10.3)
Chloride: 108 mmol/L (ref 98–111)
Creatinine, Ser: 1.06 mg/dL — ABNORMAL HIGH (ref 0.44–1.00)
GFR, Estimated: >60 mL/min (ref >60)
Glucose, Bld: 332 mg/dL — ABNORMAL HIGH (ref 70–99)
Potassium: 4.2 mmol/L (ref 3.5–5.1)
Sodium: 138 mmol/L (ref 135–145)

## 2020-12-29 LAB — GLUCOSE, CAPILLARY
Glucose-Capillary: 103 mg/dL — ABNORMAL HIGH (ref 70–99)
Glucose-Capillary: 147 mg/dL — ABNORMAL HIGH (ref 70–99)
Glucose-Capillary: 161 mg/dL — ABNORMAL HIGH (ref 70–99)
Glucose-Capillary: 189 mg/dL — ABNORMAL HIGH (ref 70–99)
Glucose-Capillary: 310 mg/dL — ABNORMAL HIGH (ref 70–99)
Glucose-Capillary: 71 mg/dL (ref 70–99)
Glucose-Capillary: 98 mg/dL (ref 70–99)

## 2020-12-29 LAB — CBC WITH DIFFERENTIAL/PLATELET
Abs Immature Granulocytes: 0.02 10*3/uL (ref 0.00–0.07)
Basophils Absolute: 0 10*3/uL (ref 0.0–0.1)
Basophils Relative: 1 %
Eosinophils Absolute: 0.4 10*3/uL (ref 0.0–0.5)
Eosinophils Relative: 7 %
HCT: 36.1 % (ref 36.0–46.0)
Hemoglobin: 11.3 g/dL — ABNORMAL LOW (ref 12.0–15.0)
Immature Granulocytes: 0 %
Lymphocytes Relative: 35 %
Lymphs Abs: 2.1 10*3/uL (ref 0.7–4.0)
MCH: 22.6 pg — ABNORMAL LOW (ref 26.0–34.0)
MCHC: 31.3 g/dL (ref 30.0–36.0)
MCV: 72.2 fL — ABNORMAL LOW (ref 80.0–100.0)
Monocytes Absolute: 0.7 10*3/uL (ref 0.1–1.0)
Monocytes Relative: 11 %
Neutro Abs: 2.9 10*3/uL (ref 1.7–7.7)
Neutrophils Relative %: 46 %
Platelets: 346 10*3/uL (ref 150–400)
RBC: 5 MIL/uL (ref 3.87–5.11)
RDW: 15.4 % (ref 11.5–15.5)
WBC: 6.1 10*3/uL (ref 4.0–10.5)
nRBC: 0 % (ref 0.0–0.2)

## 2020-12-29 LAB — VALPROIC ACID LEVEL: Valproic Acid Lvl: <10 ug/mL — ABNORMAL LOW (ref 50.0–100.0)

## 2020-12-29 MED ORDER — ADULT MULTIVITAMIN W/MINERALS CH
1.0000 | ORAL_TABLET | Freq: Every day | ORAL | Status: DC
  Administered 2020-12-31 – 2021-01-02 (×3): 1 via ORAL
  Filled 2020-12-29 (×3): qty 1

## 2020-12-29 MED ORDER — ZIPRASIDONE MESYLATE 20 MG IM SOLR
5.0000 mg | Freq: Once | INTRAMUSCULAR | Status: AC
  Administered 2020-12-29: 5 mg via INTRAMUSCULAR
  Filled 2020-12-29: qty 20

## 2020-12-29 MED ORDER — RISPERIDONE 1 MG PO TBDP
1.0000 mg | ORAL_TABLET | Freq: Two times a day (BID) | ORAL | Status: DC
  Administered 2020-12-29 – 2021-01-02 (×6): 1 mg via ORAL
  Filled 2020-12-29 (×10): qty 1

## 2020-12-29 MED ORDER — BOOST / RESOURCE BREEZE PO LIQD CUSTOM
1.0000 | Freq: Three times a day (TID) | ORAL | Status: DC
  Administered 2020-12-29 (×3): 1 via ORAL

## 2020-12-29 MED ORDER — PROSOURCE PLUS PO LIQD
30.0000 mL | Freq: Three times a day (TID) | ORAL | Status: DC
  Administered 2020-12-29: 30 mL via ORAL
  Filled 2020-12-29: qty 30

## 2020-12-29 NOTE — Progress Notes (Signed)
PROGRESS NOTE  Courtney Davies XMI:[id number] DOB: September 28, 1975 DOA: 12/01/2020 PCP: Pcp, No  HPI/Recap of past 24 hours: Courtney Davies is an 45 y.o. female past medical history significant for essential hypertension, vitamin B12 deficiency obstructive sleep apnea admitted to the ICU on 12/01/2020 for V. fib arrest, subsequently patient was extubated on 12/06/2020, there is a concern of anoxic brain injury, CTA of the chest was negative for PE.  2D echo was done that showed mild cardiomyopathy seen by cardiology and recommended further work-up once neurological status is improved.  Transfer out of the ICU and noted to have some dysphagia and cognitive deficit, core track was placed started on nutrition. Seen by palliative care, and PEG tube placed on 12/13/2020.  Patient continues to pull on the PEG tube, IR has been consulted on 12/23/2020 to remove PEG tube. On 12/18/2020 she developed fever with a severe leukocytosis and tachycardia, subsequently noted to have abdominal wall cellulitis around PEG tube, on 12/25/2020 she pulled out her PEG tube this was discontinued.    Today, pt continues be very agitated, confused, constantly pulling at IV sites and getting out of bed, very high fall risk.   Assessment/Plan: Principal Problem:   Fever Active Problems:   Cardiac arrest (HCC)   Type 1 diabetes mellitus without complication (HCC)   Hypertension   GERD (gastroesophageal reflux disease)   Anemia   Encephalopathy acute   Acute systolic heart failure (HCC)   Dysphagia   Goals of care, counseling/discussion   Abdominal wall cellulitis   V. fib arrest with acute respiratory failure with hypoxia and acute encephalopathy: Cardiology deemed not appropriate for ischemic work-up until neurological status improved. Continue amiodarone and Coreg Discharge planning per TOC to skilled nursing facility  Abdominal wall cellulitis around PEG tube Noticed on 12/19/2020 started on IV Rocephin cultures have  been negative so far. Infectious disease consulted who recommended to change to Keflex.  We have changed her to IV Rocephin as she is n.p.o. which we will continue for 2 weeks.  When she is able to take orals we could switch her to oral Keflex to finish her 2-week treatment total Advance diet   Acute systolic heart failure In the setting of V. fib arrest a 2D echo was done that showed an EF of 40% with global hypokinesia. Continue Coreg and losartan.  Paroxysmal atrial fibrillation Rate controlled on amiodarone and Coreg Transient episode cardiology recommended against long-term anticoagulation.  Thyroid nodule: Follow-up with PCP as an outpatient.  Type 2 diabetes mellitus A1c of 6.9 Continue long-acting insulin and sliding scale insulin CBGs every 4H   Aspiration pneumonia Resolved.  Ambulatory dysfunction/deconditioning Physical therapy was consulted recommended skilled nursing facility.   Anoxic brain injury with severe agitation and behavioral disturbances GOC discussion- Palliative re-consulted for follow up pn 12/29/20 Patient continues to be agitated and eating less than 25% of her meals when she is fed by the nursing staff but when she is fed by family she is 100%. Spoke to Psychiatry again on 12/29/20, recommended risperidone, continue depakote, continue Haldol IV as needed. She needs to be off restraints for 24 hours before we get her to a rehab facility Due to very difficult situation, will consult palliative. Family interested as well for further [place] discussion.   Nutrition tube feedings PEG tube has been removed Family will try to feed the patient as she is dose well eating with the family.    Malnutrition Type:  Nutrition Problem: Inadequate oral intake Etiology:  acute illness   Malnutrition Characteristics:     Nutrition Interventions:      Estimated body mass index is 33.52 kg/m as calculated from the following:   Height as of this encounter:  5' 6"  (1.676 m).   Weight as of this encounter: 94.2 kg.     Code Status: Full  Family Communication: None at bedside  Disposition Plan: Status is: Inpatient  Remains inpatient appropriate because:Inpatient level of care appropriate due to severity of illness  Dispo: The patient is from: Home              Anticipated d/c is to: SNF              Patient currently is not medically stable to d/c.   Difficult to place patient Yes     Consultants: PCCM Cardiology Psychiatry  Procedures: None  Antimicrobials: Rocephin  DVT prophylaxis:  lovenox   Objective: Vitals:   12/29/20 0432 12/29/20 0830 12/29/20 1008 12/29/20 1027  BP: 97/77 101/65 (!) 121/98 101/65  Pulse: 74 (!) 128 88 84  Resp: 19  17 20   Temp: 97.7 F (36.5 C)  98.6 F (37 C) (!) 97.4 F (36.3 C)  TempSrc: Axillary  Oral Oral  SpO2: 98%  100% 99%  Weight: 94.2 kg     Height:        Intake/Output Summary (Last 24 hours) at 12/29/2020 1518 Last data filed at 12/29/2020 0133 Gross per 24 hour  Intake 240 ml  Output --  Net 240 ml   Filed Weights   12/25/20 0421 12/26/20 0500 12/29/20 0432  Weight: 95.3 kg 94.5 kg 94.2 kg    Exam: General: NAD  Cardiovascular: S1, S2 present Respiratory: CTAB Abdomen: Soft, nontender, nondistended, bowel sounds present, prior PEG tube site noted Musculoskeletal: No bilateral pedal edema noted Skin: Normal Psychiatry: Normal mood     Data Reviewed: CBC: Recent Labs  Lab 12/26/20 1356 12/29/20 0343  WBC 9.8 6.1  NEUTROABS 5.6 2.9  HGB 11.4* 11.3*  HCT 35.9* 36.1  MCV 71.9* 72.2*  PLT 451* 656   Basic Metabolic Panel: Recent Labs  Lab 12/28/20 0041 12/29/20 0343  NA 138 138  K 4.1 4.2  CL 106 108  CO2 22 22  GLUCOSE 118* 332*  BUN 16 18  CREATININE 0.92 1.06*  CALCIUM 9.1 8.8*   GFR: Estimated Creatinine Clearance: 78.4 mL/min (A) (by C-G formula based on SCr of 1.06 mg/dL (H)). Liver Function Tests: No results for input(s): AST,  ALT, ALKPHOS, BILITOT, PROT, ALBUMIN in the last 168 hours. No results for input(s): LIPASE, AMYLASE in the last 168 hours. No results for input(s): AMMONIA in the last 168 hours. Coagulation Profile: No results for input(s): INR, PROTIME in the last 168 hours. Cardiac Enzymes: No results for input(s): CKTOTAL, CKMB, CKMBINDEX, TROPONINI in the last 168 hours. BNP (last 3 results) No results for input(s): PROBNP in the last 8760 hours. HbA1C: No results for input(s): HGBA1C in the last 72 hours. CBG: Recent Labs  Lab 12/29/20 0007 12/29/20 0428 12/29/20 0751 12/29/20 1118 12/29/20 1218  GLUCAP 103* 310* 147* 71 98   Lipid Profile: No results for input(s): CHOL, HDL, LDLCALC, TRIG, CHOLHDL, LDLDIRECT in the last 72 hours. Thyroid Function Tests: No results for input(s): TSH, T4TOTAL, FREET4, T3FREE, THYROIDAB in the last 72 hours. Anemia Panel: No results for input(s): VITAMINB12, FOLATE, FERRITIN, TIBC, IRON, RETICCTPCT in the last 72 hours. Urine analysis:    Component Value Date/Time   COLORURINE  YELLOW 12/17/2020 0957   APPEARANCEUR HAZY (A) 12/17/2020 0957   LABSPEC 1.023 12/17/2020 0957   PHURINE 5.0 12/17/2020 0957   GLUCOSEU 50 (A) 12/17/2020 0957   HGBUR NEGATIVE 12/17/2020 0957   BILIRUBINUR NEGATIVE 12/17/2020 0957   KETONESUR NEGATIVE 12/17/2020 0957   PROTEINUR 30 (A) 12/17/2020 0957   NITRITE NEGATIVE 12/17/2020 0957   LEUKOCYTESUR NEGATIVE 12/17/2020 0957   Sepsis Labs: @LABRCNTIP (procalcitonin:4,lacticidven:4)  )No results found for this or any previous visit (from the past 240 hour(s)).    Studies: No results found.  Scheduled Meds:  (feeding supplement) PROSource Plus  30 mL Oral TID BM   acetaminophen (TYLENOL) oral liquid 160 mg/5 mL  650 mg Oral Q6H   amiodarone  200 mg Oral BID   carvedilol  6.25 mg Per Tube BID WC   cephALEXin  500 mg Oral Q8H   divalproex  250 mg Oral Q8H   enoxaparin (LOVENOX) injection  40 mg Subcutaneous Q24H    feeding supplement  1 Container Oral TID BM   insulin aspart  0-20 Units Subcutaneous Q4H   insulin glargine-yfgn  10 Units Subcutaneous BID   LORazepam  2 mg Intramuscular Once   losartan  25 mg Per Tube Daily   multivitamin with minerals  1 tablet Oral Daily   nicotine  21 mg Transdermal Daily   pantoprazole  40 mg Oral BID   risperiDONE  1 mg Oral BID   sodium chloride flush  10-40 mL Intracatheter Q12H    Continuous Infusions:   LOS: 28 days     Alma Friendly, MD [facility]  If 7PM-7AM, please contact night-coverage [email or web address] 12/29/2020, 3:18 PM

## 2020-12-29 NOTE — Progress Notes (Signed)
Made contact with family to make them aware of patient fall.  Spoke with sisters [name] & [name] as well as patients mom and dad. We had a great conversation of support and offering them update on plan of care and insight into what potential progression looked liked.  We covered several topics including inpatient rehab, palliative care, and continued stay here in the hospital.     All family members expressed appreciation for the conversation and were all open to talking with Palliative team. [name] agreed to be the accountable party to help coordinate family with Palliate team for a meeting when we got to that point.    Family did ask if I could help connect them with a physician for an update.    I was able to connect with Dr. Horris Latino and provided her with an update of the conversations with the family and she will make contact with them.    Richardean Canal RN, BSN, CCRN-K

## 2020-12-29 NOTE — Progress Notes (Signed)
Physical Therapy Treatment Patient Details Name: Courtney Davies MRN: [id number] DOB: 01-Jul-1975 Today's Date: 12/29/2020   History of Present Illness Pt is a 45 y/o female admitted 12/01/20 with Vfib arrest with ROSC; pt also (+) COVID. CTA negative for PE. MRI 8/21 consistent with hypoxic/ischemic injury. 8/21 RLL infiltrate seen on CXR from aspiration pna. Intubated 8/18-23 (failed initial attempt to extubate 8/19). S/p PEG placement 8/30. PMH includes B12 deficiency, DM, OA, bronchitis, dysphagia.    PT Comments    Patient received in bed, sitter present, restraints on patient. Patient requires mod assist for bed mobility due to difficulty following directions and decreased safety awareness. Patient was able to take a few steps along side of bed with min/mod assist. Would not use walker. She has limited ability to participate with therapy due to cognition and difficulty with follow through and attention to task. Patient will continue to benefit from skilled PT while here to improve safety with mobility and independence.      Recommendations for follow up therapy are one component of a multi-disciplinary discharge planning process, led by the attending physician.  Recommendations may be updated based on patient status, additional functional criteria and insurance authorization.  Follow Up Recommendations  SNF     Equipment Recommendations  Other (comment) (TBD)    Recommendations for Other Services       Precautions / Restrictions Precautions Precautions: Fall Precaution Comments: incontinence, abdominal wound w/ drainage (from PEG tube) patient had fall this morning Restrictions Weight Bearing Restrictions: No Other Position/Activity Restrictions: soft restraints, mitts, posey waist restraint in bed     Mobility  Bed Mobility Overal bed mobility: Needs Assistance Bed Mobility: Supine to Sit;Sit to Supine;Rolling Rolling: Min assist;+2 for physical assistance   Supine to sit:  Mod assist;+2 for physical assistance Sit to supine: Mod assist;+2 for physical assistance   General bed mobility comments: tactile and verbal cues, decreased awareness of safety. Sitting on very edge of bed and unable to follow instruction to scoot hips over to center    Transfers Overall transfer level: Needs assistance Equipment used: Rolling walker (2 wheeled) Transfers: Sit to/from Stand Sit to Stand: Min assist;+2 physical assistance;+2 safety/equipment         General transfer comment: min assist for power up and safety with sit to stand transfers. Trying to reach out an pull up on bsc to stand.  Ambulation/Gait Ambulation/Gait assistance: Mod assist Gait Distance (Feet): 6 Feet Assistive device: None Gait Pattern/deviations: Step-to pattern;Decreased step length - right;Decreased step length - left Gait velocity: reduced   General Gait Details: patient ambulated along edge of bed, would not use walker. Min assist for balance.   Stairs             Wheelchair Mobility    Modified Rankin (Stroke Patients Only) Modified Rankin (Stroke Patients Only) Pre-Morbid Rankin Score: No symptoms Modified Rankin: Severe disability     Balance Overall balance assessment: Needs assistance;History of Falls Sitting-balance support: Feet supported Sitting balance-Leahy Scale: Fair Sitting balance - Comments: requires supervision for safety with sitting on side of bed   Standing balance support: No upper extremity supported;During functional activity Standing balance-Leahy Scale: Poor Standing balance comment: requires min/mod assist for staticstanding and dynamic standing                            Cognition Arousal/Alertness: Awake/alert Behavior During Therapy: Impulsive Overall Cognitive Status: Impaired/Different from baseline Area of Impairment: Problem  solving;Awareness;Following commands;Safety/judgement;Memory;Attention;Orientation                  Orientation Level: Disoriented to;Place;Time;Situation Current Attention Level: Sustained Memory: Decreased short-term memory Following Commands: Follows one step commands inconsistently;Follows one step commands with increased time Safety/Judgement: Decreased awareness of safety;Decreased awareness of deficits Awareness: Intellectual Problem Solving: Slow processing;Decreased initiation;Difficulty sequencing;Requires verbal cues;Requires tactile cues General Comments: patient requires multimodal cues for participation and following direction. Increased time.      Exercises Other Exercises Other Exercises: standing marching, standing leg kicks with min assist    General Comments        Pertinent Vitals/Pain Pain Assessment: No/denies pain    Home Living                      Prior Function            PT Goals (current goals can now be found in the care plan section) Acute Rehab PT Goals Patient Stated Goal: none stated PT Goal Formulation: Patient unable to participate in goal setting Time For Goal Achievement: 01/05/21 Potential to Achieve Goals: Fair Additional Goals Additional Goal #1: Pt will follow single step commands 2/5 trials. Progress towards PT goals: Progressing toward goals    Frequency    Min 2X/week      PT Plan Current plan remains appropriate    Co-evaluation              AM-PAC PT "6 Clicks" Mobility   Outcome Measure  Help needed turning from your back to your side while in a flat bed without using bedrails?: A Little Help needed moving from lying on your back to sitting on the side of a flat bed without using bedrails?: A Lot Help needed moving to and from a bed to a chair (including a wheelchair)?: A Lot Help needed standing up from a chair using your arms (e.g., wheelchair or bedside chair)?: A Lot Help needed to walk in hospital room?: A Lot Help needed climbing 3-5 steps with a railing? : Total 6 Click Score: 12     End of Session   Activity Tolerance: Patient tolerated treatment well Patient left: in bed;with restraints reapplied;with nursing/sitter in room Nurse Communication: Mobility status PT Visit Diagnosis: Other abnormalities of gait and mobility (R26.89);Other symptoms and signs involving the nervous system (R29.898);Difficulty in walking, not elsewhere classified (R26.2);History of falling (Z91.81);Unsteadiness on feet (R26.81)     Time: 1610-9604 PT Time Calculation (min) (ACUTE ONLY): 23 min  Charges:  $Gait Training: 8-22 mins $Therapeutic Activity: 8-22 mins                     [employer], PT, [employer] 12/29/20,2:23 PM

## 2020-12-29 NOTE — Progress Notes (Signed)
Nutrition Follow-up  DOCUMENTATION CODES:   Not applicable  INTERVENTION:   -Boost Breeze po TID, each supplement provides 250 kcal and 9 grams of protein  -30 ml Prosource Plus TID, each supplement provides 100 kcals and 15 grams protein -MVI with minerals daily -RD will follow for diet advancement and adjust supplement regimen as appropriate  NUTRITION DIAGNOSIS:   Inadequate oral intake related to acute illness as evidenced by NPO status.  Ongoing  GOAL:   Patient will meet greater than or equal to 90% of their needs  Unmet  MONITOR:   PO intake, Supplement acceptance, Diet advancement, Labs, Skin, I & O's  REASON FOR ASSESSMENT:   Ventilator    ASSESSMENT:   45 yo female admitted post Vfib arrest, COVID+. PMH includes DM, GERD, dysphagia, B12 def  8/30- PEG placed, advanced to clear liquid diet 9/6- advanced to regular diet with thin liquids 9/8- restraints added due to pt pulling at PEG site 9/10- abdominal binder placed due to pt pulling at PEG sute 9/11- pt pulled out PEG  Reviewed I/O's: +240 ml x 24 hours and +10.3 L since 12/15/20  Pt continuing to require restraints. She was advanced to a clear liquid diet last night. IR recommending slow diet advancement. Surgical intervention may be warranted if no improvement in output of abdominal wound.   Per chart review, pt and family do not want PEG. Pt family has been bringing outside food for pt to eat and she does well with eating when family is present. Pt is hesitant to take medications of eat when family is not present.   Medications reviewed and include depakote, haldol, and ativan.   Labs reviewed: CBGS: 95-310 (inpatient orders for glycemic control are 0-20 units insulin aspart every 4 hours and 10 units insulin glargine-yfgn BID).    Diet Order:   Diet Order             Diet clear liquid Room service appropriate? Yes; Fluid consistency: Thin  Diet effective now                   EDUCATION  NEEDS:   Not appropriate for education at this time  Skin:  Skin Assessment: Skin Integrity Issues: Skin Integrity Issues:: Incisions, Other (Comment) Incisions: umbilicus Other: MASD to buttocks  Last BM:  12/24/20  Height:   Ht Readings from Last 1 Encounters:  12/01/20 5' 6"  (1.676 m)    Weight:   Wt Readings from Last 1 Encounters:  12/29/20 94.2 kg   BMI:  Body mass index is 33.52 kg/m.  Estimated Nutritional Needs:   Kcal:  1850-2050 kcals  Protein:  100-120 g  Fluid:  >/= 1.8 L    Loistine Chance, RD, LDN, [place] Registered Dietitian II Certified Diabetes Care and Education Specialist Please refer to [facility] for RD and/or RD on-call/weekend/after hours pager

## 2020-12-29 NOTE — Progress Notes (Signed)
12/29/20 1008  What Happened  Was fall witnessed? Yes  Who witnessed fall? Sharyn Lull, NT  Patients activity before fall ambulating-unassisted  Point of contact buttocks  Was patient injured? No  Patient found on floor  Found by Staff-comment  Stated prior activity ambulating-unassisted  Follow Up  MD notified Dr. Horris Latino  Time MD notified 1000  Family notified Yes - comment Berneice Gandy, RN notified family)  Additional tests No  Simple treatment Other (comment)  Progress note created (see row info)  (back in bed, soft restraints)  Adult Fall Risk Assessment  Risk Factor Category (scoring not indicated) Fall has occurred during this admission (document High fall risk)  Age 45  Fall History: Fall within 6 months prior to admission 5  Elimination; Bowel and/or Urine Incontinence 2  Elimination; Bowel and/or Urine Urgency/Frequency 0  Medications: includes PCA/Opiates, Anti-convulsants, Anti-hypertensives, Diuretics, Hypnotics, Laxatives, Sedatives, and Psychotropics 5  Patient Care Equipment 1  Mobility-Assistance 2  Mobility-Gait 2  Mobility-Sensory Deficit 0  Altered awareness of immediate physical environment 1  Impulsiveness 2  Lack of understanding of one's physical/cognitive limitations 4  Total Score 24  Patient Fall Risk Level High fall risk  Adult Fall Risk Interventions  Required Bundle Interventions *See Row Information* High fall risk - low, moderate, and high requirements implemented  Additional Interventions Safety Sitter/Safety Rounder  Screening for Fall Injury Risk (To be completed on HIGH fall risk patients) - Assessing Need for Floor Mats  Risk For Fall Injury- Criteria for Floor Mats Confusion/dementia (+NuDESC, CIWA, TBI, etc.)  Will Implement Floor Mats Yes  Vitals  Temp 98.6 F (37 C)  Temp Source Oral  BP (!) 121/98  MAP (mmHg) 107  BP Location Left Arm  BP Method Automatic  Patient Position (if appropriate) Lying  Pulse Rate 88  Pulse Rate  Source Monitor  Resp 17  Oxygen Therapy  SpO2 100 %  O2 Device Room Air  Patient Activity (if Appropriate) In bed  Pulse Oximetry Type Continuous  SpO2 Alarm Limit Low (!) 90  Pain Assessment  Pain Scale 0-10  Pain Score 0  PAINAD (Pain Assessment in Advanced Dementia)  Breathing 0  Negative Vocalization 0  Facial Expression 0  Body Language 0  Consolability 0  PAINAD Score 0  PCA/Epidural/Spinal Assessment  Respiratory Pattern Regular;Unlabored  Neurological  Neuro (WDL) X  Level of Consciousness Alert  Orientation Level Oriented to person  Cognition Follows commands;Poor judgement;Poor safety awareness;Poor attention/concentration;Impulsive  Speech Clear  Pupil Assessment  Yes  R Pupil Size (mm) 3  R Pupil Shape Round  R Pupil Reaction Brisk  L Pupil Size (mm) 3  L Pupil Shape Round  L Pupil Reaction Brisk  Additional Pupil Assessments No  Motor Function/Sensation Assessment Grip  R Hand Grip Strong  L Hand Grip Strong  RUE Motor Response Purposeful movement  LUE Motor Response Purposeful movement  RLE Motor Response Purposeful movement  LLE Motor Response Purposeful movement  Neuro Symptoms None  Neuro symptoms relieved by Rest  Reflexes  Gag Present  Cough Present  R Corneal Intact  L Corneal Intact  R Babinski Absent  L Babinski Absent  Glasgow Coma Scale  Eye Opening 4  Best Verbal Response (NON-intubated) 3  Best Motor Response 6  Glasgow Coma Scale Score 13  Musculoskeletal  Musculoskeletal (WDL) X  Assistive Device MaxiMove  Generalized Weakness Yes  Weight Bearing Restrictions No  Integumentary  Integumentary (WDL) X  Skin Color Appropriate for ethnicity  Skin Condition Dry  Skin Integrity Surgical Incision (see LDA)  Excoriated Location Buttocks  Excoriated Location Orientation Bilateral  Excoriated Intervention Barrier cream  Skin Turgor Non-tenting

## 2020-12-30 DIAGNOSIS — I469 Cardiac arrest, cause unspecified: Secondary | ICD-10-CM | POA: Diagnosis not present

## 2020-12-30 DIAGNOSIS — Z7189 Other specified counseling: Secondary | ICD-10-CM | POA: Diagnosis not present

## 2020-12-30 DIAGNOSIS — G9340 Encephalopathy, unspecified: Secondary | ICD-10-CM | POA: Diagnosis not present

## 2020-12-30 DIAGNOSIS — G931 Anoxic brain damage, not elsewhere classified: Secondary | ICD-10-CM | POA: Diagnosis not present

## 2020-12-30 DIAGNOSIS — L03311 Cellulitis of abdominal wall: Secondary | ICD-10-CM | POA: Diagnosis not present

## 2020-12-30 DIAGNOSIS — I5021 Acute systolic (congestive) heart failure: Secondary | ICD-10-CM | POA: Diagnosis not present

## 2020-12-30 LAB — GLUCOSE, CAPILLARY
Glucose-Capillary: 128 mg/dL — ABNORMAL HIGH (ref 70–99)
Glucose-Capillary: 112 mg/dL — ABNORMAL HIGH (ref 70–99)
Glucose-Capillary: 168 mg/dL — ABNORMAL HIGH (ref 70–99)
Glucose-Capillary: 222 mg/dL — ABNORMAL HIGH (ref 70–99)
Glucose-Capillary: 189 mg/dL — ABNORMAL HIGH (ref 70–99)
Glucose-Capillary: 149 mg/dL — ABNORMAL HIGH (ref 70–99)

## 2020-12-30 MED ORDER — LIDOCAINE VISCOUS HCL 2 % MT SOLN
OROMUCOSAL | Status: AC
  Filled 2020-12-30: qty 15

## 2020-12-30 MED ORDER — ENSURE ENLIVE PO LIQD
237.0000 mL | Freq: Three times a day (TID) | ORAL | Status: DC
  Administered 2020-12-30 – 2021-01-02 (×7): 237 mL via ORAL

## 2020-12-30 NOTE — Progress Notes (Signed)
PROGRESS NOTE  STAR RESLER XLK:[id number] DOB: 03-Jan-1976 DOA: 12/01/2020 PCP: Pcp, No  HPI/Recap of past 24 hours: Courtney Davies is an 45 y.o. female past medical history significant for essential hypertension, vitamin B12 deficiency obstructive sleep apnea admitted to the ICU on 12/01/2020 for V. fib arrest, subsequently patient was extubated on 12/06/2020, there is a concern of anoxic brain injury, CTA of the chest was negative for PE.  2D echo was done that showed mild cardiomyopathy seen by cardiology and recommended further work-up once neurological status is improved.  Transfer out of the ICU and noted to have some dysphagia and cognitive deficit, core track was placed started on nutrition. Seen by palliative care, and PEG tube placed on 12/13/2020.  Patient continues to pull on the PEG tube, IR has been consulted on 12/23/2020 to remove PEG tube. On 12/18/2020 she developed fever with a severe leukocytosis and tachycardia, subsequently noted to have abdominal wall cellulitis around PEG tube, on 12/25/2020 she pulled out her PEG tube this was discontinued.    Today, pt continues to be agitated and pulling out IV lines and dressing. Noted some oozing around prior PEG site. Still unable to eat adequately (spitting out food/meds)   Assessment/Plan: Principal Problem:   Fever Active Problems:   Cardiac arrest (HCC)   Type 1 diabetes mellitus without complication (HCC)   Hypertension   GERD (gastroesophageal reflux disease)   Anemia   Encephalopathy acute   Acute systolic heart failure (HCC)   Dysphagia   Goals of care, counseling/discussion   Abdominal wall cellulitis   V. fib arrest with acute respiratory failure with hypoxia and acute encephalopathy Cardiology deemed not appropriate for ischemic work-up until neurological status improved. Continue amiodarone and Coreg Discharge planning per TOC to skilled nursing facility  Abdominal wall cellulitis around PEG tube Noticed on  12/19/2020 started on IV Rocephin cultures have been negative so far. Infectious disease consulted who recommended to change to Keflex.  We have changed her to IV Rocephin as she is n.p.o. which we will continue for 2 weeks.  When she is able to take orals we could switch her to oral Keflex to finish her 2-week treatment total Advance diet   Acute systolic heart failure In the setting of V. fib arrest a 2D echo was done that showed an EF of 40% with global hypokinesia. Continue Coreg and losartan.  Paroxysmal atrial fibrillation Rate controlled on amiodarone and Coreg Transient episode cardiology recommended against long-term anticoagulation.  Thyroid nodule: Follow-up with PCP as an outpatient.  Type 2 diabetes mellitus A1c of 6.9 Continue long-acting insulin and sliding scale insulin CBGs every 4H   Aspiration pneumonia Resolved.  Ambulatory dysfunction/deconditioning Physical therapy was consulted recommended skilled nursing facility.   Anoxic brain injury with severe agitation and behavioral disturbances GOC discussion- Palliative re-consulted for follow up pn 12/29/20 Patient continues to be agitated and eating less than 25% of her meals when she is fed by the nursing staff but when she is fed by family she is 100%. Spoke to Psychiatry again on 12/29/20, recommended risperidone, continue depakote, continue Haldol IV as needed. She needs to be off restraints for 24 hours before we get her to a rehab facility Due to very difficult situation, palliative consulted. Family interested as well for further [place] discussion.   Nutrition tube feedings PEG tube has been removed Family will try to feed the patient as she is dose well eating with the family.    Malnutrition Type:  Nutrition Problem: Inadequate oral intake Etiology: acute illness   Malnutrition Characteristics:     Nutrition Interventions:      Estimated body mass index is 33.52 kg/m as calculated from the  following:   Height as of this encounter: 5' 6"  (1.676 m).   Weight as of this encounter: 94.2 kg.     Code Status: Full  Family Communication: None at bedside  Disposition Plan: Status is: Inpatient  Remains inpatient appropriate because:Inpatient level of care appropriate due to severity of illness  Dispo: The patient is from: Home              Anticipated d/c is to: SNF              Patient currently is not medically stable to d/c.   Difficult to place patient Yes     Consultants: PCCM Cardiology Psychiatry  Procedures: None  Antimicrobials: Keflex  DVT prophylaxis: Lovenox   Objective: Vitals:   12/29/20 2009 12/30/20 0757 12/30/20 1110 12/30/20 1548  BP: (!) 148/78 132/88 125/77 114/68  Pulse:    93  Resp:      Temp: 97.9 F (36.6 C) 97.8 F (36.6 C) 98.3 F (36.8 C) (!) 97.5 F (36.4 C)  TempSrc: Oral Oral Oral Oral  SpO2:    99%  Weight:      Height:        Intake/Output Summary (Last 24 hours) at 12/30/2020 1716 Last data filed at 12/29/2020 2325 Gross per 24 hour  Intake 480 ml  Output --  Net 480 ml   Filed Weights   12/25/20 0421 12/26/20 0500 12/29/20 0432  Weight: 95.3 kg 94.5 kg 94.2 kg    Exam: General: NAD, confused, agitated Cardiovascular: S1, S2 present Respiratory: CTAB Abdomen: Soft, nontender, nondistended, bowel sounds present, prior PEG tube site noted Musculoskeletal: No bilateral pedal edema noted Skin: Normal Psychiatry: Agitated/restless mood     Data Reviewed: CBC: Recent Labs  Lab 12/26/20 1356 12/29/20 0343  WBC 9.8 6.1  NEUTROABS 5.6 2.9  HGB 11.4* 11.3*  HCT 35.9* 36.1  MCV 71.9* 72.2*  PLT 451* 268   Basic Metabolic Panel: Recent Labs  Lab 12/28/20 0041 12/29/20 0343  NA 138 138  K 4.1 4.2  CL 106 108  CO2 22 22  GLUCOSE 118* 332*  BUN 16 18  CREATININE 0.92 1.06*  CALCIUM 9.1 8.8*   GFR: Estimated Creatinine Clearance: 78.4 mL/min (A) (by C-G formula based on SCr of 1.06 mg/dL  (H)). Liver Function Tests: No results for input(s): AST, ALT, ALKPHOS, BILITOT, PROT, ALBUMIN in the last 168 hours. No results for input(s): LIPASE, AMYLASE in the last 168 hours. No results for input(s): AMMONIA in the last 168 hours. Coagulation Profile: No results for input(s): INR, PROTIME in the last 168 hours. Cardiac Enzymes: No results for input(s): CKTOTAL, CKMB, CKMBINDEX, TROPONINI in the last 168 hours. BNP (last 3 results) No results for input(s): PROBNP in the last 8760 hours. HbA1C: No results for input(s): HGBA1C in the last 72 hours. CBG: Recent Labs  Lab 12/30/20 0143 12/30/20 0354 12/30/20 0752 12/30/20 1106 12/30/20 1521  GLUCAP 128* 112* 168* 222* 189*   Lipid Profile: No results for input(s): CHOL, HDL, LDLCALC, TRIG, CHOLHDL, LDLDIRECT in the last 72 hours. Thyroid Function Tests: No results for input(s): TSH, T4TOTAL, FREET4, T3FREE, THYROIDAB in the last 72 hours. Anemia Panel: No results for input(s): VITAMINB12, FOLATE, FERRITIN, TIBC, IRON, RETICCTPCT in the last 72 hours. Urine analysis:  Component Value Date/Time   COLORURINE YELLOW 12/17/2020 0957   APPEARANCEUR HAZY (A) 12/17/2020 0957   LABSPEC 1.023 12/17/2020 0957   PHURINE 5.0 12/17/2020 0957   GLUCOSEU 50 (A) 12/17/2020 0957   HGBUR NEGATIVE 12/17/2020 0957   BILIRUBINUR NEGATIVE 12/17/2020 0957   KETONESUR NEGATIVE 12/17/2020 0957   PROTEINUR 30 (A) 12/17/2020 0957   NITRITE NEGATIVE 12/17/2020 0957   LEUKOCYTESUR NEGATIVE 12/17/2020 0957   Sepsis Labs: @LABRCNTIP (procalcitonin:4,lacticidven:4)  )No results found for this or any previous visit (from the past 240 hour(s)).    Studies: No results found.  Scheduled Meds:  acetaminophen (TYLENOL) oral liquid 160 mg/5 mL  650 mg Oral Q6H   amiodarone  200 mg Oral BID   carvedilol  6.25 mg Per Tube BID WC   cephALEXin  500 mg Oral Q8H   divalproex  250 mg Oral Q8H   enoxaparin (LOVENOX) injection  40 mg Subcutaneous Q24H    feeding supplement  237 mL Oral TID BM   insulin aspart  0-20 Units Subcutaneous Q4H   insulin glargine-yfgn  10 Units Subcutaneous BID   lidocaine       LORazepam  2 mg Intramuscular Once   losartan  25 mg Per Tube Daily   multivitamin with minerals  1 tablet Oral Daily   nicotine  21 mg Transdermal Daily   pantoprazole  40 mg Oral BID   risperiDONE  1 mg Oral BID   sodium chloride flush  10-40 mL Intracatheter Q12H    Continuous Infusions:   LOS: 29 days     Alma Friendly, MD [facility]  If 7PM-7AM, please contact night-coverage [email or web address] 12/30/2020, 5:16 PM

## 2020-12-30 NOTE — Plan of Care (Signed)
  Problem: Clinical Measurements: Goal: Ability to maintain clinical measurements within normal limits will improve Outcome: Progressing   Problem: Clinical Measurements: Goal: Respiratory complications will improve Outcome: Progressing   Problem: Clinical Measurements: Goal: Cardiovascular complication will be avoided Outcome: Progressing   Problem: Nutrition: Goal: Adequate nutrition will be maintained Outcome: Progressing   Problem: Coping: Goal: Level of anxiety will decrease Outcome: Progressing   Problem: Elimination: Goal: Will not experience complications related to bowel motility Outcome: Progressing   Problem: Pain Managment: Goal: General experience of comfort will improve Outcome: Progressing   Problem: Safety: Goal: Ability to remain free from injury will improve Outcome: Progressing   Problem: Skin Integrity: Goal: Risk for impaired skin integrity will decrease Outcome: Progressing   Problem: Safety: Goal: Non-violent Restraint(s) Outcome: Progressing

## 2020-12-30 NOTE — Progress Notes (Signed)
Nutrition Follow-up  DOCUMENTATION CODES:   Not applicable  INTERVENTION:   -D/c Boost Breeze po TID, each supplement provides 250 kcal and 9 grams of protein  -D/c Prosource -Ensure Enlive po TID, each supplement provides 350 kcal and 20 grams of protein  -Magic cup TID with meals, each supplement provides 290 kcal and 9 grams of protein  -Continue MVI with minerals daily  NUTRITION DIAGNOSIS:   Inadequate oral intake related to acute illness as evidenced by NPO status.  Advanced back to PO diet on 12/30/20  GOAL:   Patient will meet greater than or equal to 90% of their needs  Progressing   MONITOR:   PO intake, Supplement acceptance, Diet advancement, Labs, Skin, I & O's  REASON FOR ASSESSMENT:   Ventilator    ASSESSMENT:   45 yo female admitted post Vfib arrest, COVID+. PMH includes DM, GERD, dysphagia, B12 def  8/30- PEG placed, advanced to clear liquid diet 9/6- advanced to regular diet with thin liquids 9/8- restraints added due to pt pulling at PEG site 9/10- abdominal binder placed due to pt pulling at PEG sute 9/11- pt pulled out PEG 9/15- advanced to clear liquid diet 9/16- advanced to dysphagia 3 diet  Reviewed I/O's: +480 ml x 24 hours and +9.4 L since 12/16/20   Per nursing notes, family amenable to meet with palliative care team to discuss goals of care.   Pt has been advanced to a dysphagia 3 diet. No meal completion data available to assess at this time. Pt is refusing Prosource supplements and minimally accepting of Boost Breeze.   Per chart review, pt and family do not want PEG. Pt family has been bringing outside food for pt to eat and she does well with eating when family is present. Pt is hesitant to take medications or eat when family is not present.   Labs reviewed: CBGS: 112-189 (inpatient orders for glycemic control are 0-20 units insulin aspart every 4 hours and 10 units insulin glargine-yfgn).    Diet Order:   Diet Order              DIET DYS 3 Room service appropriate? Yes; Fluid consistency: Thin  Diet effective now                   EDUCATION NEEDS:   Not appropriate for education at this time  Skin:  Skin Assessment: Skin Integrity Issues: Skin Integrity Issues:: Incisions, Other (Comment) Incisions: umbilicus Other: MASD to buttocks  Last BM:  12/29/20  Height:   Ht Readings from Last 1 Encounters:  12/01/20 5' 6"  (1.676 m)    Weight:   Wt Readings from Last 1 Encounters:  12/29/20 94.2 kg   BMI:  Body mass index is 33.52 kg/m.  Estimated Nutritional Needs:   Kcal:  1850-2050 kcals  Protein:  100-120 g  Fluid:  >/= 1.8 L    Loistine Chance, RD, LDN, [place] Registered Dietitian II Certified Diabetes Care and Education Specialist Please refer to [facility] for RD and/or RD on-call/weekend/after hours pager

## 2020-12-30 NOTE — Progress Notes (Addendum)
Speech Language Pathology Treatment: Dysphagia;Cognitive-Linquistic  Patient Details Name: LI BOBO MRN: [id number] DOB: [birth year] Today's Date: 12/30/2020 Time: 4098-1191 SLP Time Calculation (min) (ACUTE ONLY): 34 min  Assessment / Plan / Recommendation Clinical Impression  Lunah is continuing to make improvements re: communication/language, cognition and swallow. Co- treatment with OT. Upon entry pt crying and internally distracted by thoughts of her belongings being stolen. With cues she was able to eventually sustain her attention to answer questions related to family pictures and consumption of soda and graham cracker. Named family members with approx 60%  (mom arrived to confirm), followed 1 step commands while engaging in eating, transfer to chair with mild-mod prompts. Her topic maintenance is improving for longer periods. She was pleasant, smiling and brightened up upon seeing niece "Aubryn" in photo. Dala stated "I was in the hospital" for place showing some improvement of spatial orientation.  Cough noted following larger sips of thin via straw mediated with controlled intake. Mastication unremarkable with solid. Continue Dys 3, thin with full assist and supervision.    HPI HPI: Pt is a 45 y/o female admitted 8/18 with Vfib arrest with ROSC, COVID. CTA negative for PE. ETT 8/18-23 (failed inital attempt to extubate 8/19). MRI 8/21 consistent with hypoxic/ischemic injury. CXR 8/21 RLL infiltrate. PMT 8/23: continued aggressive care; family very hopeful for continued improvement. PEG 8/30. PMH: B12 deficiency, DM, OA, bronchitis.      SLP Plan  Continue with current plan of care      Recommendations for follow up therapy are one component of a multi-disciplinary discharge planning process, led by the attending physician.  Recommendations may be updated based on patient status, additional functional criteria and insurance authorization.    Recommendations  Diet  recommendations: Dysphagia 3 (mechanical soft);Thin liquid Liquids provided via: Cup;Straw Medication Administration: Crushed with puree Supervision: Staff to assist with self feeding;Full supervision/cueing for compensatory strategies Compensations: Minimize environmental distractions;Slow rate;Small sips/bites Postural Changes and/or Swallow Maneuvers: Seated upright 90 degrees                Oral Care Recommendations: Oral care BID Follow up Recommendations: Skilled Nursing facility;24 hour supervision/assistance SLP Visit Diagnosis: Dysphagia, unspecified (R13.10);Cognitive communication deficit (Y78.295) Plan: Continue with current plan of care                       Houston Siren  12/30/2020, 11:06 AM  Orbie Pyo Colvin Caroli.Ed Risk analyst [phone] Office [phone]

## 2020-12-30 NOTE — Progress Notes (Signed)
Daily Progress Note   Patient Name: Courtney Davies       Date: 12/30/2020 DOB: Jan 08, 1976  Age: 45 y.o. MRN#: [id number] Attending Physician: Alma Friendly, MD Primary Care Physician: Pcp, No Admit Date: 12/01/2020  Reason for Consultation/Follow-up: Establishing goals of care  Subjective: Medical records reviewed. Patient assessed at the bedside. She is sitting up in bedside chair with her mother [name] present. She is confused but calm, initially appears cautious and distrustful of this PA but becomes more accepting with the assistance of her mother.     Discussed the current care plan and created space and opportunity for patient and family's thoughts and feelings on Ada's current illness. Juletta understands that "26" is of some significance, and I explained that she could possibly go to inpatient rehab or SNF if restrains are not needed for at least 24 hours. I shared that while I hope she will continue to progress and sustain herself with oral nutrition, I worry that there is also the chance she will not meet her nutritional needs without a PEG. Discussed the importance of ongoing conversations with family and offered to arrange a family meeting to further discuss goals of care and treatment options. Patient's mother [name] agrees that this would beneficial. She notes that [name] (patient's sister) could assist given that her work schedule needs to be considered. I briefly introduced the topic and philosophy of hospice and comfort feeds. Barbara verbalizes her understanding and appreciation.  I then called patient's sister [name]. Palliative care was introduced and the difference between palliative care and hospice was explained. She shares that goals are currently aligned with  full scope treatment, with the support of palliative care. Provided update on the CIR review process and the need for behaviors to be well-managed before discharge from the hospital. Counseled on the importance of planning for best case and worst case scenarios, emphasizing that if patient declines and a PEG tube is no longer desired, hospice services would be appropriate. [name] finds this information very helpful and agrees to coordinate with her family to arrange family meeting and inform PMT of their preferred date/time.  Questions and concerns addressed. PMT contact information provided. Patient's family was encouraged to call with additional needs or concerns.  Length of Stay: 29  Current Medications: Scheduled Meds:  . acetaminophen (TYLENOL)  oral liquid 160 mg/5 mL  650 mg Oral Q6H  . amiodarone  200 mg Oral BID  . carvedilol  6.25 mg Per Tube BID WC  . cephALEXin  500 mg Oral Q8H  . divalproex  250 mg Oral Q8H  . enoxaparin (LOVENOX) injection  40 mg Subcutaneous Q24H  . feeding supplement  237 mL Oral TID BM  . insulin aspart  0-20 Units Subcutaneous Q4H  . insulin glargine-yfgn  10 Units Subcutaneous BID  . LORazepam  2 mg Intramuscular Once  . losartan  25 mg Per Tube Daily  . multivitamin with minerals  1 tablet Oral Daily  . nicotine  21 mg Transdermal Daily  . pantoprazole  40 mg Oral BID  . risperiDONE  1 mg Oral BID  . sodium chloride flush  10-40 mL Intracatheter Q12H    Continuous Infusions:    PRN Meds: albuterol, diphenhydrAMINE, haloperidol lactate, labetalol, LORazepam, LORazepam **OR** [DISCONTINUED] LORazepam, ondansetron (ZOFRAN) IV, sodium chloride flush  Physical Exam Vitals and nursing note reviewed.  Constitutional:      General: She is not in acute distress. Cardiovascular:     Rate and Rhythm: Normal rate.  Pulmonary:     Effort: Pulmonary effort is normal.  Neurological:     Mental Status: She is alert. She is disoriented.  Psychiatric:         Behavior: Behavior is cooperative.        Cognition and Memory: Cognition is impaired.            Vital Signs: BP 125/77 (BP Location: Right Arm)   Pulse 84   Temp 98.3 F (36.8 C) (Oral)   Resp 20   Ht 5' 6"  (1.676 m)   Wt 94.2 kg   SpO2 99%   BMI 33.52 kg/m  SpO2: SpO2: 99 % O2 Device: O2 Device: Room Air O2 Flow Rate: O2 Flow Rate (L/min): 2 L/min  Intake/output summary:  Intake/Output Summary (Last 24 hours) at 12/30/2020 1329 Last data filed at 12/29/2020 2325 Gross per 24 hour  Intake 480 ml  Output --  Net 480 ml    LBM: Last BM Date: 12/29/20 Baseline Weight: Weight: 100 kg Most recent weight: Weight: 94.2 kg       Palliative Assessment/Data: 40%      Patient Active Problem List   Diagnosis Date Noted  . Fever 12/20/2020  . Abdominal wall cellulitis 12/20/2020  . Dysphagia   . Goals of care, counseling/discussion   . Acute systolic heart failure ([place])   . Encephalopathy acute   . Type 1 diabetes mellitus without complication ([place]) 01/74/9449  . Hypertension 12/02/2020  . GERD (gastroesophageal reflux disease) 12/02/2020  . Anemia 12/02/2020  . Cardiac arrest [facility]) 12/01/2020    Palliative Care Assessment & Plan   Patient Profile: 45 y.o. female  with past medical history of HTN, DM type 2. B12 def GERD OA admitted on 12/01/2020 with post arrest downtime ~10 minutes, + COVID, NSTEMI, CTA negative PE. Previously had COVID infection ~4 weeks prior to arrest and reportedly recovered and returned to work. Beginning to be more alert and extubated 12/06/20. MRI does show anoxic brain injury.   Assessment: Hypoxic encephalopathy s/p acute respiratory failure Acute systolic heart failure, compensated Deconditioning Goals of care conversation  Recommendations/Plan: Patient and family remain hopeful for recovery and approval for CIR Family agrees to arrange a family meeting to further discuss goc, will call PMT with a preferred date/time  Psychosocial  and emotional support provided  PMT remains available to family for support as they are faced with complex decision-making  Goals of Care and Additional Recommendations: Limitations on Scope of Treatment: Full Scope Treatment  Prognosis:  Guarded  Discharge Planning: [place] for rehab with Palliative care service follow-up  Care plan was discussed with Patient, patient's mother [name], sister [name], Dr. Horris Latino  Total time: 45 minutes Greater than 50% of this time was spent in counseling and coordinating care related to the above assessment and plan.  Dorthy Cooler, PA-C Palliative Medicine Team Team phone # [phone]  Thank you for allowing the Palliative Medicine Team to assist in the care of this patient. Please utilize secure chat with additional questions, if there is no response within 30 minutes please call the above phone number.  Palliative Medicine Team providers are available by phone from 7am to 7pm daily and can be reached through the team cell phone.  Should this patient require assistance outside of these hours, please call the patient's attending physician.

## 2020-12-30 NOTE — Progress Notes (Signed)
Request to IR for evaluation of previous G-tube tract due to reported leaking of food and medicines.   On evaluation patient is in soft wrist restraints, she denies any abdominal pain. She states she is "going to leave out the window."   Evaluation of RUQ G-tube tract shows no significant stoma remaining, there is an approximately 4 cm x 2 cm well healing superficial wound which is undressed. Non tender to palpation, unable to illicit any drainage from the site. Per RN patient removes dressings and that is why it is undressed.  Recommend dressing the open wound if possible to promote continued healing of the previous G-tube tract and contain any further drainage. Beyond that there is not much else IR can offer at this time as the tract will need to continue to heal on it's own.   Advance diet per primary team, no contraindications to PO food/water/meds from IR perspective.  Please call with questions or concerns.  Candiss Norse, PA-C

## 2020-12-30 NOTE — Progress Notes (Signed)
Inpatient Rehab Admissions Coordinator:   Opened case with [facility] Medicaid for prior authorization for CIR.  Will continue to follow and provide updates as available.    Shann Medal, PT, DPT Admissions Coordinator [phone] 12/30/20  10:58 AM

## 2020-12-30 NOTE — Progress Notes (Signed)
Occupational Therapy Treatment Patient Details Name: Courtney Davies MRN: [id number] DOB: [birth year] Today's Date: 12/30/2020   History of present illness Pt is a 45 y/o female admitted 12/01/20 with Vfib arrest with ROSC; pt also (+) COVID. CTA negative for PE. MRI 8/21 consistent with hypoxic/ischemic injury. 8/21 RLL infiltrate seen on CXR from aspiration pna. Intubated 8/18-23 (failed initial attempt to extubate 8/19). S/p PEG placement 8/30. PMH includes B12 deficiency, DM, OA, bronchitis, dysphagia.   OT comments  Pt. Seen with ST for skilled therapy session.  Pt. Making gains.  Able to complete bed mobility with one step commands.  Seated grooming and LB bathing with cues.  Sit/stand and steps to chair min/mod a x2 for safety.  Pt. Cooperative and responded well with therapy today.  Mother present for session today. Very active in session and helpful with redirection of pt.  Pt. Remains excellent candidate for continued higher level therapies in CIR unit.     Recommendations for follow up therapy are one component of a multi-disciplinary discharge planning process, led by the attending physician.  Recommendations may be updated based on patient status, additional functional criteria and insurance authorization.    Follow Up Recommendations  CIR;Supervision/Assistance - 24 hour    Equipment Recommendations  Other (comment)    Recommendations for Other Services      Precautions / Restrictions Precautions Precautions: Fall Precaution Comments: incontinence, abdominal wound w/ drainage (from PEG tube) patient had fall this morning Restrictions Other Position/Activity Restrictions: soft restraints, mitts, posey waist restraint in bed       Mobility Bed Mobility Overal bed mobility: Needs Assistance Bed Mobility: Supine to Sit   Sidelying to sit: Supervision;HOB elevated            Transfers Overall transfer level: Needs assistance Equipment used: None;2 person hand held  assist Transfers: Sit to/from [facility] Sit to Stand: Min assist;+2 safety/equipment;Mod assist Stand pivot transfers: Min assist;+2 safety/equipment       General transfer comment: pt. able to stand with one step command.  steps to recliner and reached for arm rest when instructed to.  increased time and physcial demo required to scoot hips back in chair and ultimately required myself and ST to use pad to fully scoot her back    Balance                                           ADL either performed or assessed with clinical judgement   ADL Overall ADL's : Needs assistance/impaired     Grooming: Wash/dry face;Set up;Sitting Grooming Details (indicate cue type and reason): able to wash and wipe face without assistance or instructions     Lower Body Bathing: Set up;Sitting/lateral leans Lower Body Bathing Details (indicate cue type and reason): drainage from abdominal wound had started to run down pts. leg. she was able to note it and wash and dry LLE without assistance         Toilet Transfer: Minimal assistance;+2 for safety/equipment;Cueing for sequencing;Cueing for safety Toilet Transfer Details (indicate cue type and reason): able to stand from eob and take approx. 4 steps to recliner following one step commands with increased time and inconsistently         Functional mobility during ADLs: Minimal assistance;+2 for safety/equipment;Cueing for sequencing;Cueing for safety General ADL Comments: Pt progressing with ADLs and OOB activities, benefits from quiet  environment and multimodal cues     Vision       Perception     Praxis      Cognition Arousal/Alertness: Awake/alert Behavior During Therapy: Impulsive Overall Cognitive Status: Impaired/Different from baseline Area of Impairment: Problem solving;Awareness;Following commands;Safety/judgement;Memory;Attention;Orientation                 Orientation Level: Disoriented  to;Place;Time;Situation Current Attention Level: Sustained Memory: Decreased short-term memory Following Commands: Follows one step commands inconsistently;Follows one step commands with increased time Safety/Judgement: Decreased awareness of safety;Decreased awareness of deficits Awareness: Intellectual Problem Solving: Slow processing;Decreased initiation;Difficulty sequencing;Requires verbal cues;Requires tactile cues General Comments: initially tearful and perseverating on someone taking her money.  her mother arrived during session and was also able to guide in redirection.  pt. with a stack of family photos.  great inconsistency naming who was who but was able to recognize niece aubree everytime.  this particular family member also made her smile the most. pts. mother states she is very close with her niece.  was also able to name her other niece without error. could not recognize or name either of her sons or even herself when looking at pictures she was in.  asked mother if she could write names of each family member in picture on the back so therapy would be able to assist patient and also determine accuracy when she was naming them.  at one point she said she had 2 sisters then she pointed to pic of one of her sons and said it was her brother but used the sisters name.  she actually does have a brother Courtney Davies dad) but could not identify him in the pic but was able to say "those are my niece and nephew" that were with him in the picture        Exercises     Shoulder Instructions       General Comments      Pertinent Vitals/ Pain       Pain Assessment: No/denies pain Faces Pain Scale: No hurt  Home Living                                          Prior Functioning/Environment              Frequency  Min 2X/week        Progress Toward Goals  OT Goals(current goals can now be found in the care plan section)  Progress towards OT goals: Progressing  toward goals     Plan Discharge plan needs to be updated;Frequency needs to be updated    Co-evaluation    PT/OT/SLP Co-Evaluation/Treatment: Yes Reason for Co-Treatment: Necessary to address cognition/behavior during functional activity;To address functional/ADL transfers   OT goals addressed during session: ADL's and self-care      AM-PAC OT "6 Clicks" Daily Activity     Outcome Measure   Help from another person eating meals?: A Lot Help from another person taking care of personal grooming?: A Lot Help from another person toileting, which includes using toliet, bedpan, or urinal?: Total Help from another person bathing (including washing, rinsing, drying)?: A Lot Help from another person to put on and taking off regular upper body clothing?: A Lot Help from another person to put on and taking off regular lower body clothing?: A Lot 6 Click Score: 11    End of Session Equipment Utilized  During Treatment: Gait belt  OT Visit Diagnosis: Other abnormalities of gait and mobility (R26.89);Muscle weakness (generalized) (M62.81);Other symptoms and signs involving the nervous system (R29.898);Other symptoms and signs involving cognitive function   Activity Tolerance Patient tolerated treatment well   Patient Left in chair;with call bell/phone within reach;with chair alarm set;with family/visitor present   Nurse Communication Other (comment) (spoke with rn to alert stomach drainage and which restraints to be re applied, mother requesting they not be on during her visit but understands if they have to be on.  rn requesting lap restraint on, other rn states she will secure it on pt.)        Time: [id number] OT Time Calculation (min): 34 min  Charges: OT General Charges $OT Visit: 1 Visit OT Treatments $Self Care/Home Management : 8-22 mins  Courtney Davies, COTA/L Acute Rehabilitation [phone]   Courtney Davies 12/30/2020, 12:37 PM

## 2020-12-31 DIAGNOSIS — G931 Anoxic brain damage, not elsewhere classified: Secondary | ICD-10-CM | POA: Diagnosis not present

## 2020-12-31 DIAGNOSIS — I5021 Acute systolic (congestive) heart failure: Secondary | ICD-10-CM | POA: Diagnosis not present

## 2020-12-31 DIAGNOSIS — I469 Cardiac arrest, cause unspecified: Secondary | ICD-10-CM | POA: Diagnosis not present

## 2020-12-31 DIAGNOSIS — L03311 Cellulitis of abdominal wall: Secondary | ICD-10-CM | POA: Diagnosis not present

## 2020-12-31 LAB — GLUCOSE, CAPILLARY
Glucose-Capillary: 134 mg/dL — ABNORMAL HIGH (ref 70–99)
Glucose-Capillary: 142 mg/dL — ABNORMAL HIGH (ref 70–99)
Glucose-Capillary: 158 mg/dL — ABNORMAL HIGH (ref 70–99)
Glucose-Capillary: 154 mg/dL — ABNORMAL HIGH (ref 70–99)
Glucose-Capillary: 149 mg/dL — ABNORMAL HIGH (ref 70–99)
Glucose-Capillary: 146 mg/dL — ABNORMAL HIGH (ref 70–99)

## 2020-12-31 MED ORDER — SODIUM CHLORIDE 0.9% FLUSH
3.0000 mL | Freq: Two times a day (BID) | INTRAVENOUS | Status: DC
  Administered 2021-01-01 – 2021-01-02 (×3): 3 mL via INTRAVENOUS

## 2020-12-31 MED ORDER — SODIUM CHLORIDE 0.9% FLUSH: 3.0000 mL | INTRAVENOUS | Status: DC | PRN

## 2020-12-31 MED ORDER — HALOPERIDOL LACTATE 5 MG/ML IJ SOLN
5.0000 mg | Freq: Four times a day (QID) | INTRAMUSCULAR | Status: DC | PRN
  Administered 2020-12-31 – 2021-01-01 (×2): 5 mg via INTRAVENOUS
  Filled 2020-12-31 (×2): qty 1

## 2020-12-31 NOTE — Progress Notes (Signed)
PROGRESS NOTE  RAMONIA MCCLARAN ZOX:[id number] DOB: 04-09-1976 DOA: 12/01/2020 PCP: Pcp, No  HPI/Recap of past 24 hours: NASHLA ALTHOFF is an 45 y.o. female past medical history significant for essential hypertension, vitamin B12 deficiency obstructive sleep apnea admitted to the ICU on 12/01/2020 for V. fib arrest, subsequently patient was extubated on 12/06/2020, there is a concern of anoxic brain injury, CTA of the chest was negative for PE.  2D echo was done that showed mild cardiomyopathy seen by cardiology and recommended further work-up once neurological status is improved.  Transfer out of the ICU and noted to have some dysphagia and cognitive deficit, core track was placed started on nutrition. Seen by palliative care, and PEG tube placed on 12/13/2020.  Patient continues to pull on the PEG tube, IR has been consulted on 12/23/2020 to remove PEG tube. On 12/18/2020 she developed fever with a severe leukocytosis and tachycardia, subsequently noted to have abdominal wall cellulitis around PEG tube, on 12/25/2020 she pulled out her PEG tube this was discontinued.    Today, met pt with friend at bedside, who she was able to recognize and converse with.       Assessment/Plan: Principal Problem:   Fever Active Problems:   Cardiac arrest (HCC)   Type 1 diabetes mellitus without complication (HCC)   Hypertension   GERD (gastroesophageal reflux disease)   Anemia   Encephalopathy acute   Acute systolic heart failure (HCC)   Dysphagia   Goals of care, counseling/discussion   Abdominal wall cellulitis   V. fib arrest with acute respiratory failure with hypoxia and acute encephalopathy Cardiology deemed not appropriate for ischemic work-up until neurological status improved. Continue amiodarone and Coreg Discharge planning per TOC to skilled nursing facility  Abdominal wall cellulitis around PEG tube Noticed on 12/19/2020 started on IV Rocephin cultures have been negative so far. Infectious  disease consulted who recommended to change to Keflex.  We have changed her to IV Rocephin as she is n.p.o. which we will continue for 2 weeks.  When she is able to take orals we could switch her to oral Keflex to finish her 2-week treatment total Advance diet   Acute systolic heart failure In the setting of V. fib arrest a 2D echo was done that showed an EF of 40% with global hypokinesia. Continue Coreg and losartan.  Paroxysmal atrial fibrillation Rate controlled on amiodarone and Coreg Transient episode cardiology recommended against long-term anticoagulation.  Thyroid nodule: Follow-up with PCP as an outpatient.  Type 2 diabetes mellitus A1c of 6.9 Continue long-acting insulin and sliding scale insulin CBGs every 4H   Aspiration pneumonia Resolved.  Ambulatory dysfunction/deconditioning Physical therapy was consulted recommended skilled nursing facility.   Anoxic brain injury with severe agitation and behavioral disturbances GOC discussion- Palliative re-consulted for follow up pn 12/29/20 Patient continues to be agitated and eating less than 25% of her meals when she is fed by the nursing staff but when she is fed by family she is 100%. Spoke to Psychiatry again on 12/29/20, recommended risperidone, continue depakote, continue Haldol IV as needed. She needs to be off restraints for 24 hours before we get her to a rehab facility Due to very difficult situation, palliative consulted. Family interested as well for further [place] discussion.   Nutrition tube feedings PEG tube has been removed Family will try to feed the patient as she is dose well eating with the family.    Malnutrition Type:  Nutrition Problem: Inadequate oral intake Etiology: acute illness  Malnutrition Characteristics:     Nutrition Interventions:      Estimated body mass index is 33.48 kg/m as calculated from the following:   Height as of this encounter: 5' 6"  (1.676 m).   Weight as of this  encounter: 94.1 kg.     Code Status: Full  Family Communication: None at bedside  Disposition Plan: Status is: Inpatient  Remains inpatient appropriate because:Inpatient level of care appropriate due to severity of illness  Dispo: The patient is from: Home              Anticipated d/c is to: SNF              Patient currently is not medically stable to d/c.   Difficult to place patient Yes     Consultants: PCCM Cardiology Psychiatry  Procedures: None  Antimicrobials: Keflex  DVT prophylaxis: Lovenox   Objective: Vitals:   12/31/20 0500 12/31/20 0809 12/31/20 1117 12/31/20 1524  BP:  136/87 110/82 120/72  Pulse:  89 81 85  Resp:  18 18 18   Temp:  98.2 F (36.8 C) 97.8 F (36.6 C) 97.6 F (36.4 C)  TempSrc:  Oral Axillary Oral  SpO2:  100% 98% 99%  Weight: 94.1 kg     Height:        Intake/Output Summary (Last 24 hours) at 12/31/2020 1626 Last data filed at 12/31/2020 0500 Gross per 24 hour  Intake 390 ml  Output --  Net 390 ml   Filed Weights   12/26/20 0500 12/29/20 0432 12/31/20 0500  Weight: 94.5 kg 94.2 kg 94.1 kg    Exam: General: NAD Cardiovascular: S1, S2 present Respiratory: CTAB Abdomen: Soft, nontender, nondistended, bowel sounds present, prior PEG tube site noted Musculoskeletal: No bilateral pedal edema noted Skin: Normal Psychiatry: Agitated/restless mood     Data Reviewed: CBC: Recent Labs  Lab 12/26/20 1356 12/29/20 0343  WBC 9.8 6.1  NEUTROABS 5.6 2.9  HGB 11.4* 11.3*  HCT 35.9* 36.1  MCV 71.9* 72.2*  PLT 451* 937   Basic Metabolic Panel: Recent Labs  Lab 12/28/20 0041 12/29/20 0343  NA 138 138  K 4.1 4.2  CL 106 108  CO2 22 22  GLUCOSE 118* 332*  BUN 16 18  CREATININE 0.92 1.06*  CALCIUM 9.1 8.8*   GFR: Estimated Creatinine Clearance: 78.3 mL/min (A) (by C-G formula based on SCr of 1.06 mg/dL (H)). Liver Function Tests: No results for input(s): AST, ALT, ALKPHOS, BILITOT, PROT, ALBUMIN in the last 168  hours. No results for input(s): LIPASE, AMYLASE in the last 168 hours. No results for input(s): AMMONIA in the last 168 hours. Coagulation Profile: No results for input(s): INR, PROTIME in the last 168 hours. Cardiac Enzymes: No results for input(s): CKTOTAL, CKMB, CKMBINDEX, TROPONINI in the last 168 hours. BNP (last 3 results) No results for input(s): PROBNP in the last 8760 hours. HbA1C: No results for input(s): HGBA1C in the last 72 hours. CBG: Recent Labs  Lab 12/31/20 0115 12/31/20 0456 12/31/20 0808 12/31/20 1115 12/31/20 1522  GLUCAP 134* 142* 158* 154* 149*   Lipid Profile: No results for input(s): CHOL, HDL, LDLCALC, TRIG, CHOLHDL, LDLDIRECT in the last 72 hours. Thyroid Function Tests: No results for input(s): TSH, T4TOTAL, FREET4, T3FREE, THYROIDAB in the last 72 hours. Anemia Panel: No results for input(s): VITAMINB12, FOLATE, FERRITIN, TIBC, IRON, RETICCTPCT in the last 72 hours. Urine analysis:    Component Value Date/Time   COLORURINE YELLOW 12/17/2020 0957   APPEARANCEUR HAZY (A) 12/17/2020  0957   LABSPEC 1.023 12/17/2020 0957   PHURINE 5.0 12/17/2020 0957   GLUCOSEU 50 (A) 12/17/2020 0957   HGBUR NEGATIVE 12/17/2020 0957   BILIRUBINUR NEGATIVE 12/17/2020 0957   KETONESUR NEGATIVE 12/17/2020 0957   PROTEINUR 30 (A) 12/17/2020 0957   NITRITE NEGATIVE 12/17/2020 0957   LEUKOCYTESUR NEGATIVE 12/17/2020 0957   Sepsis Labs: @LABRCNTIP (procalcitonin:4,lacticidven:4)  )No results found for this or any previous visit (from the past 240 hour(s)).    Studies: No results found.  Scheduled Meds:  acetaminophen (TYLENOL) oral liquid 160 mg/5 mL  650 mg Oral Q6H   amiodarone  200 mg Oral BID   carvedilol  6.25 mg Per Tube BID WC   cephALEXin  500 mg Oral Q8H   divalproex  250 mg Oral Q8H   enoxaparin (LOVENOX) injection  40 mg Subcutaneous Q24H   feeding supplement  237 mL Oral TID BM   insulin aspart  0-20 Units Subcutaneous Q4H   insulin glargine-yfgn   10 Units Subcutaneous BID   LORazepam  2 mg Intramuscular Once   losartan  25 mg Per Tube Daily   multivitamin with minerals  1 tablet Oral Daily   nicotine  21 mg Transdermal Daily   pantoprazole  40 mg Oral BID   risperiDONE  1 mg Oral BID   sodium chloride flush  3 mL Intravenous Q12H    Continuous Infusions:   LOS: 30 days     Alma Friendly, MD [facility]  If 7PM-7AM, please contact night-coverage [email or web address] 12/31/2020, 4:26 PM

## 2021-01-01 DIAGNOSIS — L03311 Cellulitis of abdominal wall: Secondary | ICD-10-CM | POA: Diagnosis not present

## 2021-01-01 DIAGNOSIS — I469 Cardiac arrest, cause unspecified: Secondary | ICD-10-CM | POA: Diagnosis not present

## 2021-01-01 DIAGNOSIS — I5021 Acute systolic (congestive) heart failure: Secondary | ICD-10-CM | POA: Diagnosis not present

## 2021-01-01 DIAGNOSIS — G931 Anoxic brain damage, not elsewhere classified: Secondary | ICD-10-CM | POA: Diagnosis not present

## 2021-01-01 LAB — GLUCOSE, CAPILLARY
Glucose-Capillary: 137 mg/dL — ABNORMAL HIGH (ref 70–99)
Glucose-Capillary: 150 mg/dL — ABNORMAL HIGH (ref 70–99)
Glucose-Capillary: 157 mg/dL — ABNORMAL HIGH (ref 70–99)
Glucose-Capillary: 163 mg/dL — ABNORMAL HIGH (ref 70–99)
Glucose-Capillary: 174 mg/dL — ABNORMAL HIGH (ref 70–99)
Glucose-Capillary: 254 mg/dL — ABNORMAL HIGH (ref 70–99)

## 2021-01-01 NOTE — Progress Notes (Signed)
PROGRESS NOTE  Courtney Davies [id number]:[id number] DOB: June 17, 1975 DOA: 12/01/2020 PCP: Pcp, No  HPI/Recap of past 24 hours: Courtney Davies is an 45 y.o. female past medical history significant for essential hypertension, vitamin B12 deficiency obstructive sleep apnea admitted to the ICU on 12/01/2020 for V. fib arrest, subsequently patient was extubated on 12/06/2020, there is a concern of anoxic brain injury, CTA of the chest was negative for PE.  2D echo was done that showed mild cardiomyopathy seen by cardiology and recommended further work-up once neurological status is improved.  Transfer out of the ICU and noted to have some dysphagia and cognitive deficit, core track was placed started on nutrition. Seen by palliative care, and PEG tube placed on 12/13/2020.  Patient continues to pull on the PEG tube, IR has been consulted on 12/23/2020 to remove PEG tube. On 12/18/2020 she developed fever with a severe leukocytosis and tachycardia, subsequently noted to have abdominal wall cellulitis around PEG tube, on 12/25/2020 she pulled out her PEG tube this was discontinued.    Today, pt agitated, threw her food all over the room.      Assessment/Plan: Principal Problem:   Fever Active Problems:   Cardiac arrest (HCC)   Type 1 diabetes mellitus without complication (HCC)   Hypertension   GERD (gastroesophageal reflux disease)   Anemia   Encephalopathy acute   Acute systolic heart failure (HCC)   Dysphagia   Goals of care, counseling/discussion   Abdominal wall cellulitis   V. fib arrest with acute respiratory failure with hypoxia and acute encephalopathy Cardiology deemed not appropriate for ischemic work-up until neurological status improved. Continue amiodarone and Coreg Discharge planning per TOC to skilled nursing facility  Abdominal wall cellulitis around PEG tube Noticed on 12/19/2020 started on IV Rocephin cultures have been negative so far. Infectious disease consulted who recommended  to change to Keflex.  We have changed her to IV Rocephin as she is n.p.o. which we will continue for 2 weeks.  When she is able to take orals we could switch her to oral Keflex to finish her 2-week treatment total Advance diet   Acute systolic heart failure In the setting of V. fib arrest a 2D echo was done that showed an EF of 40% with global hypokinesia. Continue Coreg and losartan.  Paroxysmal atrial fibrillation Rate controlled on amiodarone and Coreg Transient episode cardiology recommended against long-term anticoagulation.  Thyroid nodule: Follow-up with PCP as an outpatient.  Type 2 diabetes mellitus A1c of 6.9 Continue long-acting insulin and sliding scale insulin CBGs every 4H   Aspiration pneumonia Resolved.  Ambulatory dysfunction/deconditioning Physical therapy was consulted recommended skilled nursing facility.   Anoxic brain injury with severe agitation and behavioral disturbances GOC discussion- Palliative re-consulted for follow up pn 12/29/20 Patient continues to be agitated and eating less than 25% of her meals when she is fed by the nursing staff but when she is fed by family she is 100%. Spoke to Psychiatry again on 12/29/20, recommended risperidone, continue depakote, continue Haldol IV as needed. She needs to be off restraints for 24 hours before we get her to a rehab facility Due to very difficult situation, palliative consulted. Family interested as well for further [place] discussion.   Nutrition tube feedings PEG tube has been removed Family will try to feed the patient as she is dose well eating with the family.    Malnutrition Type:  Nutrition Problem: Inadequate oral intake Etiology: acute illness   Malnutrition Characteristics:  Nutrition Interventions:      Estimated body mass index is 33.48 kg/m as calculated from the following:   Height as of this encounter: 5' 6"  (1.676 m).   Weight as of this encounter: 94.1 kg.     Code  Status: Full  Family Communication: None at bedside  Disposition Plan: Status is: Inpatient  Remains inpatient appropriate because:Inpatient level of care appropriate due to severity of illness  Dispo: The patient is from: Home              Anticipated d/c is to: SNF              Patient currently is not medically stable to d/c.   Difficult to place patient Yes     Consultants: PCCM Cardiology Psychiatry  Procedures: None  Antimicrobials: Keflex  DVT prophylaxis: Lovenox   Objective: Vitals:   12/31/20 0809 12/31/20 1117 12/31/20 1524 01/01/21 0903  BP: 136/87 110/82 120/72 114/83  Pulse: 89 81 85 88  Resp: 18 18 18 18   Temp: 98.2 F (36.8 C) 97.8 F (36.6 C) 97.6 F (36.4 C) 98.3 F (36.8 C)  TempSrc: Oral Axillary Oral Oral  SpO2: 100% 98% 99% 98%  Weight:      Height:        Intake/Output Summary (Last 24 hours) at 01/01/2021 1412 Last data filed at 01/01/2021 1300 Gross per 24 hour  Intake 480 ml  Output --  Net 480 ml   Filed Weights   12/26/20 0500 12/29/20 0432 12/31/20 0500  Weight: 94.5 kg 94.2 kg 94.1 kg    Exam: General: NAD Cardiovascular: S1, S2 present Respiratory: CTAB Abdomen: Soft, nontender, nondistended, bowel sounds present, prior PEG tube site noted Musculoskeletal: No bilateral pedal edema noted Skin: Normal Psychiatry: Agitated/restless mood     Data Reviewed: CBC: Recent Labs  Lab 12/26/20 1356 12/29/20 0343  WBC 9.8 6.1  NEUTROABS 5.6 2.9  HGB 11.4* 11.3*  HCT 35.9* 36.1  MCV 71.9* 72.2*  PLT 451* 157   Basic Metabolic Panel: Recent Labs  Lab 12/28/20 0041 12/29/20 0343  NA 138 138  K 4.1 4.2  CL 106 108  CO2 22 22  GLUCOSE 118* 332*  BUN 16 18  CREATININE 0.92 1.06*  CALCIUM 9.1 8.8*   GFR: Estimated Creatinine Clearance: 78.3 mL/min (A) (by C-G formula based on SCr of 1.06 mg/dL (H)). Liver Function Tests: No results for input(s): AST, ALT, ALKPHOS, BILITOT, PROT, ALBUMIN in the last 168  hours. No results for input(s): LIPASE, AMYLASE in the last 168 hours. No results for input(s): AMMONIA in the last 168 hours. Coagulation Profile: No results for input(s): INR, PROTIME in the last 168 hours. Cardiac Enzymes: No results for input(s): CKTOTAL, CKMB, CKMBINDEX, TROPONINI in the last 168 hours. BNP (last 3 results) No results for input(s): PROBNP in the last 8760 hours. HbA1C: No results for input(s): HGBA1C in the last 72 hours. CBG: Recent Labs  Lab 12/31/20 2031 01/01/21 0007 01/01/21 0434 01/01/21 0751 01/01/21 1156  GLUCAP 146* 157* 163* 254* 174*   Lipid Profile: No results for input(s): CHOL, HDL, LDLCALC, TRIG, CHOLHDL, LDLDIRECT in the last 72 hours. Thyroid Function Tests: No results for input(s): TSH, T4TOTAL, FREET4, T3FREE, THYROIDAB in the last 72 hours. Anemia Panel: No results for input(s): VITAMINB12, FOLATE, FERRITIN, TIBC, IRON, RETICCTPCT in the last 72 hours. Urine analysis:    Component Value Date/Time   COLORURINE YELLOW 12/17/2020 0957   APPEARANCEUR HAZY (A) 12/17/2020 0957   LABSPEC  1.023 12/17/2020 0957   PHURINE 5.0 12/17/2020 0957   GLUCOSEU 50 (A) 12/17/2020 0957   HGBUR NEGATIVE 12/17/2020 0957   BILIRUBINUR NEGATIVE 12/17/2020 0957   KETONESUR NEGATIVE 12/17/2020 0957   PROTEINUR 30 (A) 12/17/2020 0957   NITRITE NEGATIVE 12/17/2020 0957   LEUKOCYTESUR NEGATIVE 12/17/2020 0957   Sepsis Labs: @LABRCNTIP (procalcitonin:4,lacticidven:4)  )No results found for this or any previous visit (from the past 240 hour(s)).    Studies: No results found.  Scheduled Meds:  acetaminophen (TYLENOL) oral liquid 160 mg/5 mL  650 mg Oral Q6H   amiodarone  200 mg Oral BID   carvedilol  6.25 mg Per Tube BID WC   cephALEXin  500 mg Oral Q8H   divalproex  250 mg Oral Q8H   enoxaparin (LOVENOX) injection  40 mg Subcutaneous Q24H   feeding supplement  237 mL Oral TID BM   insulin aspart  0-20 Units Subcutaneous Q4H   insulin glargine-yfgn   10 Units Subcutaneous BID   LORazepam  2 mg Intramuscular Once   losartan  25 mg Per Tube Daily   multivitamin with minerals  1 tablet Oral Daily   nicotine  21 mg Transdermal Daily   pantoprazole  40 mg Oral BID   risperiDONE  1 mg Oral BID   sodium chloride flush  3 mL Intravenous Q12H    Continuous Infusions:   LOS: 31 days     Alma Friendly, MD [facility]  If 7PM-7AM, please contact night-coverage [email or web address] 01/01/2021, 2:12 PM

## 2021-01-02 ENCOUNTER — Other Ambulatory Visit: Payer: Self-pay

## 2021-01-02 ENCOUNTER — Inpatient Hospital Stay (HOSPITAL_COMMUNITY)
Admission: RE | Admit: 2021-01-02 | Discharge: 2021-01-24 | DRG: 092 | Disposition: A | Discharge status: Home / Self Care | Payer: Medicaid Other | Source: Intra-hospital | Attending: Physical Medicine & Rehabilitation | Admitting: Physical Medicine & Rehabilitation

## 2021-01-02 ENCOUNTER — Encounter (HOSPITAL_COMMUNITY): Payer: Self-pay | Admitting: Pulmonary Disease

## 2021-01-02 ENCOUNTER — Encounter (HOSPITAL_COMMUNITY): Payer: Self-pay | Admitting: Physical Medicine & Rehabilitation

## 2021-01-02 ENCOUNTER — Encounter (HOSPITAL_COMMUNITY): Payer: Self-pay

## 2021-01-02 DIAGNOSIS — Z79899 Other long term (current) drug therapy: Secondary | ICD-10-CM

## 2021-01-02 DIAGNOSIS — R4189 Other symptoms and signs involving cognitive functions and awareness: Secondary | ICD-10-CM | POA: Diagnosis present

## 2021-01-02 DIAGNOSIS — M1712 Unilateral primary osteoarthritis, left knee: Secondary | ICD-10-CM | POA: Diagnosis present

## 2021-01-02 DIAGNOSIS — Z885 Allergy status to narcotic agent status: Secondary | ICD-10-CM | POA: Diagnosis not present

## 2021-01-02 DIAGNOSIS — E042 Nontoxic multinodular goiter: Secondary | ICD-10-CM | POA: Diagnosis present

## 2021-01-02 DIAGNOSIS — G931 Anoxic brain damage, not elsewhere classified: Secondary | ICD-10-CM | POA: Diagnosis present

## 2021-01-02 DIAGNOSIS — Z8674 Personal history of sudden cardiac arrest: Secondary | ICD-10-CM | POA: Diagnosis not present

## 2021-01-02 DIAGNOSIS — I471 Supraventricular tachycardia: Secondary | ICD-10-CM | POA: Diagnosis present

## 2021-01-02 DIAGNOSIS — Z5320 Procedure and treatment not carried out because of patient's decision for unspecified reasons: Secondary | ICD-10-CM | POA: Diagnosis not present

## 2021-01-02 DIAGNOSIS — G8929 Other chronic pain: Secondary | ICD-10-CM | POA: Diagnosis present

## 2021-01-02 DIAGNOSIS — R451 Restlessness and agitation: Secondary | ICD-10-CM | POA: Diagnosis present

## 2021-01-02 DIAGNOSIS — I5021 Acute systolic (congestive) heart failure: Secondary | ICD-10-CM | POA: Diagnosis not present

## 2021-01-02 DIAGNOSIS — I5022 Chronic systolic (congestive) heart failure: Secondary | ICD-10-CM | POA: Diagnosis present

## 2021-01-02 DIAGNOSIS — Z888 Allergy status to other drugs, medicaments and biological substances status: Secondary | ICD-10-CM | POA: Diagnosis not present

## 2021-01-02 DIAGNOSIS — L03311 Cellulitis of abdominal wall: Secondary | ICD-10-CM | POA: Diagnosis present

## 2021-01-02 DIAGNOSIS — I10 Essential (primary) hypertension: Secondary | ICD-10-CM

## 2021-01-02 DIAGNOSIS — R1310 Dysphagia, unspecified: Secondary | ICD-10-CM | POA: Diagnosis present

## 2021-01-02 DIAGNOSIS — I110 Hypertensive heart disease with heart failure: Secondary | ICD-10-CM | POA: Diagnosis present

## 2021-01-02 DIAGNOSIS — K5900 Constipation, unspecified: Secondary | ICD-10-CM | POA: Diagnosis present

## 2021-01-02 DIAGNOSIS — Z91048 Other nonmedicinal substance allergy status: Secondary | ICD-10-CM

## 2021-01-02 DIAGNOSIS — F17210 Nicotine dependence, cigarettes, uncomplicated: Secondary | ICD-10-CM | POA: Diagnosis present

## 2021-01-02 DIAGNOSIS — D62 Acute posthemorrhagic anemia: Secondary | ICD-10-CM | POA: Diagnosis present

## 2021-01-02 DIAGNOSIS — E1165 Type 2 diabetes mellitus with hyperglycemia: Secondary | ICD-10-CM

## 2021-01-02 DIAGNOSIS — K219 Gastro-esophageal reflux disease without esophagitis: Secondary | ICD-10-CM | POA: Diagnosis present

## 2021-01-02 DIAGNOSIS — Z91040 Latex allergy status: Secondary | ICD-10-CM

## 2021-01-02 DIAGNOSIS — Z8616 Personal history of COVID-19: Secondary | ICD-10-CM

## 2021-01-02 DIAGNOSIS — E538 Deficiency of other specified B group vitamins: Secondary | ICD-10-CM | POA: Diagnosis present

## 2021-01-02 DIAGNOSIS — F419 Anxiety disorder, unspecified: Secondary | ICD-10-CM | POA: Diagnosis present

## 2021-01-02 DIAGNOSIS — I469 Cardiac arrest, cause unspecified: Secondary | ICD-10-CM | POA: Diagnosis not present

## 2021-01-02 DIAGNOSIS — G9340 Encephalopathy, unspecified: Secondary | ICD-10-CM | POA: Diagnosis not present

## 2021-01-02 DIAGNOSIS — E1169 Type 2 diabetes mellitus with other specified complication: Secondary | ICD-10-CM | POA: Diagnosis not present

## 2021-01-02 LAB — GLUCOSE, CAPILLARY
Glucose-Capillary: 141 mg/dL — ABNORMAL HIGH (ref 70–99)
Glucose-Capillary: 161 mg/dL — ABNORMAL HIGH (ref 70–99)
Glucose-Capillary: 227 mg/dL — ABNORMAL HIGH (ref 70–99)
Glucose-Capillary: 184 mg/dL — ABNORMAL HIGH (ref 70–99)
Glucose-Capillary: 129 mg/dL — ABNORMAL HIGH (ref 70–99)
Glucose-Capillary: 128 mg/dL — ABNORMAL HIGH (ref 70–99)
Glucose-Capillary: 175 mg/dL — ABNORMAL HIGH (ref 70–99)

## 2021-01-02 MED ORDER — POLYETHYLENE GLYCOL 3350 17 G PO PACK
17.0000 g | PACK | Freq: Every day | ORAL | Status: DC | PRN
  Administered 2021-01-10 – 2021-01-15 (×2): 17 g via ORAL
  Filled 2021-01-02 (×3): qty 1

## 2021-01-02 MED ORDER — INSULIN ASPART 100 UNIT/ML IJ SOLN: 0.0000 [IU] | INTRAMUSCULAR | 0 refills | Status: DC

## 2021-01-02 MED ORDER — NICOTINE 21 MG/24HR TD PT24
21.0000 mg | MEDICATED_PATCH | Freq: Every day | TRANSDERMAL | Status: DC
  Administered 2021-01-03 – 2021-01-24 (×19): 21 mg via TRANSDERMAL
  Filled 2021-01-02 (×21): qty 1

## 2021-01-02 MED ORDER — CEPHALEXIN 500 MG PO CAPS: 500.0000 mg | ORAL_CAPSULE | Freq: Three times a day (TID) | ORAL | Status: DC

## 2021-01-02 MED ORDER — ENSURE ENLIVE PO LIQD: 237.0000 mL | Freq: Three times a day (TID) | ORAL | 12 refills | Status: DC

## 2021-01-02 MED ORDER — DIPHENHYDRAMINE HCL 12.5 MG/5ML PO ELIX
12.5000 mg | ORAL_SOLUTION | Freq: Four times a day (QID) | ORAL | Status: DC | PRN
  Administered 2021-01-07 – 2021-01-10 (×3): 25 mg via ORAL
  Filled 2021-01-02 (×4): qty 10

## 2021-01-02 MED ORDER — AMIODARONE HCL 200 MG PO TABS
200.0000 mg | ORAL_TABLET | Freq: Two times a day (BID) | ORAL | Status: DC
  Administered 2021-01-02 – 2021-01-24 (×44): 200 mg via ORAL
  Filled 2021-01-02 (×44): qty 1

## 2021-01-02 MED ORDER — BISACODYL 10 MG RE SUPP: 10.0000 mg | Freq: Every day | RECTAL | Status: DC | PRN

## 2021-01-02 MED ORDER — ACETAMINOPHEN 325 MG PO TABS
325.0000 mg | ORAL_TABLET | ORAL | Status: DC | PRN
  Administered 2021-01-04 – 2021-01-06 (×2): 650 mg via ORAL
  Administered 2021-01-19: 325 mg via ORAL
  Filled 2021-01-02 (×5): qty 2

## 2021-01-02 MED ORDER — PANTOPRAZOLE SODIUM 40 MG PO TBEC
40.0000 mg | DELAYED_RELEASE_TABLET | Freq: Two times a day (BID) | ORAL | Status: DC
  Administered 2021-01-02 – 2021-01-24 (×44): 40 mg via ORAL
  Filled 2021-01-02 (×44): qty 1

## 2021-01-02 MED ORDER — INSULIN GLARGINE-YFGN 100 UNIT/ML ~~LOC~~ SOLN
10.0000 [IU] | Freq: Two times a day (BID) | SUBCUTANEOUS | 0 refills | Status: DC
Start: 2021-01-02 — End: 2021-01-24

## 2021-01-02 MED ORDER — LORAZEPAM 1 MG PO TABS
2.0000 mg | ORAL_TABLET | ORAL | Status: DC | PRN
  Administered 2021-01-02 – 2021-01-06 (×11): 2 mg
  Filled 2021-01-02 (×13): qty 2

## 2021-01-02 MED ORDER — INSULIN GLARGINE-YFGN 100 UNIT/ML ~~LOC~~ SOLN
10.0000 [IU] | Freq: Two times a day (BID) | SUBCUTANEOUS | Status: DC
  Administered 2021-01-02 – 2021-01-06 (×7): 10 [IU] via SUBCUTANEOUS
  Filled 2021-01-02 (×10): qty 0.1

## 2021-01-02 MED ORDER — ALUM & MAG HYDROXIDE-SIMETH 200-200-20 MG/5ML PO SUSP
30.0000 mL | ORAL | Status: DC | PRN
  Administered 2021-01-18: 30 mL via ORAL
  Filled 2021-01-02: qty 30

## 2021-01-02 MED ORDER — PROCHLORPERAZINE MALEATE 5 MG PO TABS: 5.0000 mg | ORAL_TABLET | Freq: Four times a day (QID) | ORAL | Status: DC | PRN

## 2021-01-02 MED ORDER — NICOTINE 21 MG/24HR TD PT24
21.0000 mg | MEDICATED_PATCH | Freq: Every day | TRANSDERMAL | 0 refills | Status: DC
Start: 2021-01-03 — End: 2021-06-22

## 2021-01-02 MED ORDER — ADULT MULTIVITAMIN W/MINERALS CH: 1.0000 | ORAL_TABLET | Freq: Every day | ORAL | Status: DC

## 2021-01-02 MED ORDER — DIVALPROEX SODIUM 125 MG PO CSDR
250.0000 mg | DELAYED_RELEASE_CAPSULE | Freq: Three times a day (TID) | ORAL | Status: DC
  Administered 2021-01-02 – 2021-01-08 (×19): 250 mg via ORAL
  Filled 2021-01-02 (×19): qty 2

## 2021-01-02 MED ORDER — INSULIN ASPART 100 UNIT/ML IJ SOLN
0.0000 [IU] | Freq: Three times a day (TID) | INTRAMUSCULAR | Status: DC
Start: 2021-01-02 — End: 2021-01-24
  Administered 2021-01-02: 1 [IU] via SUBCUTANEOUS
  Administered 2021-01-03 (×2): 2 [IU] via SUBCUTANEOUS
  Administered 2021-01-04 (×3): 1 [IU] via SUBCUTANEOUS
  Administered 2021-01-05: 3 [IU] via SUBCUTANEOUS
  Administered 2021-01-05: 2 [IU] via SUBCUTANEOUS
  Administered 2021-01-05: 1 [IU] via SUBCUTANEOUS
  Administered 2021-01-06 (×2): 2 [IU] via SUBCUTANEOUS
  Administered 2021-01-07: 1 [IU] via SUBCUTANEOUS
  Administered 2021-01-07: 5 [IU] via SUBCUTANEOUS
  Administered 2021-01-07: 2 [IU] via SUBCUTANEOUS
  Administered 2021-01-08: 5 [IU] via SUBCUTANEOUS
  Administered 2021-01-08 (×2): 1 [IU] via SUBCUTANEOUS
  Administered 2021-01-09 (×2): 2 [IU] via SUBCUTANEOUS
  Administered 2021-01-09: 1 [IU] via SUBCUTANEOUS
  Administered 2021-01-10: 2 [IU] via SUBCUTANEOUS
  Administered 2021-01-10: 1 [IU] via SUBCUTANEOUS
  Administered 2021-01-10: 2 [IU] via SUBCUTANEOUS
  Administered 2021-01-11 – 2021-01-12 (×2): 1 [IU] via SUBCUTANEOUS
  Administered 2021-01-12: 3 [IU] via SUBCUTANEOUS
  Administered 2021-01-13: 2 [IU] via SUBCUTANEOUS
  Administered 2021-01-13: 1 [IU] via SUBCUTANEOUS
  Administered 2021-01-14: 2 [IU] via SUBCUTANEOUS
  Administered 2021-01-14 – 2021-01-15 (×2): 1 [IU] via SUBCUTANEOUS
  Administered 2021-01-16: 2 [IU] via SUBCUTANEOUS
  Administered 2021-01-16 (×2): 1 [IU] via SUBCUTANEOUS
  Administered 2021-01-18: 2 [IU] via SUBCUTANEOUS
  Administered 2021-01-18: 1 [IU] via SUBCUTANEOUS
  Administered 2021-01-20 (×2): 2 [IU] via SUBCUTANEOUS
  Administered 2021-01-21 – 2021-01-22 (×3): 1 [IU] via SUBCUTANEOUS

## 2021-01-02 MED ORDER — GUAIFENESIN-DM 100-10 MG/5ML PO SYRP: 5.0000 mL | ORAL_SOLUTION | Freq: Four times a day (QID) | ORAL | Status: DC | PRN

## 2021-01-02 MED ORDER — ALBUTEROL SULFATE HFA 108 (90 BASE) MCG/ACT IN AERS
2.0000 | INHALATION_SPRAY | RESPIRATORY_TRACT | Status: DC | PRN
  Filled 2021-01-02: qty 6.7

## 2021-01-02 MED ORDER — ENOXAPARIN SODIUM 40 MG/0.4ML IJ SOSY: 40.0000 mg | PREFILLED_SYRINGE | INTRAMUSCULAR | Status: DC

## 2021-01-02 MED ORDER — DIVALPROEX SODIUM 125 MG PO CSDR: 250.0000 mg | DELAYED_RELEASE_CAPSULE | Freq: Three times a day (TID) | ORAL | Status: DC

## 2021-01-02 MED ORDER — PROCHLORPERAZINE 25 MG RE SUPP: 12.5000 mg | Freq: Four times a day (QID) | RECTAL | Status: DC | PRN

## 2021-01-02 MED ORDER — ENOXAPARIN SODIUM 40 MG/0.4ML IJ SOSY
40.0000 mg | PREFILLED_SYRINGE | INTRAMUSCULAR | Status: DC
Start: 2021-01-03 — End: 2021-01-23
  Administered 2021-01-03 – 2021-01-23 (×21): 40 mg via SUBCUTANEOUS
  Filled 2021-01-02 (×21): qty 0.4

## 2021-01-02 MED ORDER — CARVEDILOL 6.25 MG PO TABS: 6.2500 mg | ORAL_TABLET | Freq: Two times a day (BID) | ORAL | Status: DC

## 2021-01-02 MED ORDER — ADULT MULTIVITAMIN W/MINERALS CH
1.0000 | ORAL_TABLET | Freq: Every day | ORAL | Status: DC
  Administered 2021-01-03 – 2021-01-24 (×22): 1 via ORAL
  Filled 2021-01-02 (×22): qty 1

## 2021-01-02 MED ORDER — LOSARTAN POTASSIUM 25 MG PO TABS: 25.0000 mg | ORAL_TABLET | Freq: Every day | ORAL | Status: DC

## 2021-01-02 MED ORDER — TRAZODONE HCL 50 MG PO TABS
25.0000 mg | ORAL_TABLET | Freq: Every evening | ORAL | Status: DC | PRN
  Administered 2021-01-03 – 2021-01-23 (×12): 50 mg via ORAL
  Filled 2021-01-02 (×16): qty 1

## 2021-01-02 MED ORDER — LOSARTAN POTASSIUM 50 MG PO TABS
25.0000 mg | ORAL_TABLET | Freq: Every day | ORAL | Status: DC
  Administered 2021-01-03 – 2021-01-24 (×22): 25 mg
  Filled 2021-01-02 (×22): qty 1

## 2021-01-02 MED ORDER — RISPERIDONE 1 MG PO TBDP: 1.0000 mg | ORAL_TABLET | Freq: Two times a day (BID) | ORAL | Status: DC

## 2021-01-02 MED ORDER — PROCHLORPERAZINE EDISYLATE 10 MG/2ML IJ SOLN: 5.0000 mg | Freq: Four times a day (QID) | INTRAMUSCULAR | Status: DC | PRN

## 2021-01-02 MED ORDER — INSULIN ASPART 100 UNIT/ML IJ SOLN
0.0000 [IU] | Freq: Every day | INTRAMUSCULAR | Status: DC
Start: 2021-01-02 — End: 2021-01-24
  Administered 2021-01-04 – 2021-01-06 (×2): 2 [IU] via SUBCUTANEOUS
  Administered 2021-01-18: 0 [IU] via SUBCUTANEOUS

## 2021-01-02 MED ORDER — ENSURE MAX PROTEIN PO LIQD
11.0000 [oz_av] | Freq: Two times a day (BID) | ORAL | Status: DC
  Administered 2021-01-02 – 2021-01-24 (×33): 11 [oz_av] via ORAL

## 2021-01-02 MED ORDER — ACETAMINOPHEN 160 MG/5ML PO SOLN
650.0000 mg | Freq: Four times a day (QID) | ORAL | Status: DC
  Administered 2021-01-02 – 2021-01-04 (×5): 650 mg via ORAL
  Filled 2021-01-02 (×7): qty 20.3

## 2021-01-02 MED ORDER — AMIODARONE HCL 200 MG PO TABS: 200.0000 mg | ORAL_TABLET | Freq: Two times a day (BID) | ORAL | Status: DC

## 2021-01-02 MED ORDER — HALOPERIDOL LACTATE 5 MG/ML IJ SOLN: 5.0000 mg | Freq: Four times a day (QID) | INTRAMUSCULAR | Status: DC | PRN

## 2021-01-02 MED ORDER — RISPERIDONE 1 MG PO TBDP
2.0000 mg | ORAL_TABLET | Freq: Two times a day (BID) | ORAL | Status: DC
  Administered 2021-01-02 – 2021-01-04 (×4): 2 mg via ORAL
  Filled 2021-01-02: qty 2
  Filled 2021-01-02 (×4): qty 1

## 2021-01-02 MED ORDER — CARVEDILOL 6.25 MG PO TABS
6.2500 mg | ORAL_TABLET | Freq: Two times a day (BID) | ORAL | Status: DC
  Administered 2021-01-02 – 2021-01-24 (×44): 6.25 mg
  Filled 2021-01-02 (×44): qty 1

## 2021-01-02 MED ORDER — LORAZEPAM 2 MG/ML IJ SOLN: 1.0000 mg | INTRAMUSCULAR | 0 refills | Status: DC | PRN

## 2021-01-02 MED ORDER — CEPHALEXIN 250 MG PO CAPS
500.0000 mg | ORAL_CAPSULE | Freq: Three times a day (TID) | ORAL | Status: DC
  Administered 2021-01-02 – 2021-01-16 (×40): 500 mg via ORAL
  Filled 2021-01-02 (×39): qty 2

## 2021-01-02 MED ORDER — FLEET ENEMA 7-19 GM/118ML RE ENEM
1.0000 | ENEMA | Freq: Once | RECTAL | Status: AC | PRN
  Administered 2021-01-12: 1 via RECTAL

## 2021-01-02 NOTE — Discharge Summary (Signed)
Discharge Summary  Courtney Davies OEV:[id number] DOB: Sep 20, 1975  PCP: Pcp, No  Admit date: 12/01/2020 Discharge date: 01/02/2021  Time spent: 40 mins  Recommendations for Outpatient Follow-up:  PCP once discharged Cardiology once discharged    Discharge Diagnoses:  Active Hospital Problems   Diagnosis Date Noted   Fever 12/20/2020   Abdominal wall cellulitis 12/20/2020   Dysphagia    Goals of care, counseling/discussion    Acute systolic heart failure (HCC)    Encephalopathy acute    Type 1 diabetes mellitus without complication ([place]) 82/99/3716   Hypertension 12/02/2020   GERD (gastroesophageal reflux disease) 12/02/2020   Anemia 12/02/2020   Cardiac arrest ([place]) 12/01/2020    Resolved Hospital Problems  No resolved problems to display.    Discharge Condition: Stable, but still confused  Diet recommendation: Regular  Vitals:   01/02/21 0425 01/02/21 0815  BP: 106/70 102/71  Pulse: 74 83  Resp: 18 17  Temp: 97.8 F (36.6 C) (!) 97 F (36.1 C)  SpO2: 96% 99%    History of present illness:  Courtney Davies is an 45 y.o. female past medical history significant for essential hypertension, vitamin B12 deficiency obstructive sleep apnea admitted to the ICU on 12/01/2020 for V. fib arrest, subsequently patient was extubated on 12/06/2020, there is a concern of anoxic brain injury, CTA of the chest was negative for PE.  2D echo was done that showed mild cardiomyopathy seen by cardiology and recommended further work-up once neurological status is improved.  Transfer out of the ICU and noted to have some dysphagia and cognitive deficit, core track was placed started on nutrition. Seen by palliative care, and PEG tube placed on 12/13/2020.  Patient continues to pull on the PEG tube, IR has been consulted on 12/23/2020 to remove PEG tube. On 12/18/2020 she developed fever with a severe leukocytosis and tachycardia, subsequently noted to have abdominal wall cellulitis around PEG  tube, on 12/25/2020 she pulled out her PEG tube this was discontinued. Pt able to tolerate orally. Hospital course has been complicated by encephalopathy due to anoxic brain injury, otherwise stable.    Today, pt noted to be calm, no new complaints    Hospital Course:  Principal Problem:   Fever Active Problems:   Cardiac arrest ([place])   Type 1 diabetes mellitus without complication (HCC)   Hypertension   GERD (gastroesophageal reflux disease)   Anemia   Encephalopathy acute   Acute systolic heart failure (HCC)   Dysphagia   Goals of care, counseling/discussion   Abdominal wall cellulitis   V. fib arrest with acute respiratory failure with hypoxia and acute encephalopathy Stable, on RA Cardiology deemed not appropriate for ischemic work-up until neurological status improved. Continue amiodarone and Coreg  Abdominal wall cellulitis around PEG tube Noticed on 12/19/2020 started on IV Rocephin cultures have been negative so far. Infectious disease consulted who recommended to change to Keflex for a total of 2 weeks, last day of antibiotics on 01/02/21 Removed PEG on 12/20/76   Acute systolic heart failure Appears compensated In the setting of V. fib arrest a 2D echo was done that showed an EF of 40% with global hypokinesia. Continue Coreg and losartan  Paroxysmal atrial fibrillation Rate controlled on amiodarone and Coreg Transient episode cardiology recommended against long-term anticoagulation.  Thyroid nodule: Follow-up with PCP as an outpatient.  Type 2 diabetes mellitus A1c of 6.9 Continue long-acting insulin and sliding scale insulin CBGs every 4H  Ambulatory dysfunction/deconditioning PT/OT   Anoxic brain injury  with severe agitation and behavioral disturbances GOC discussion- Palliative re-consulted for follow up pn 12/29/20 Patient continues to be agitated and eating less than 25% of her meals when she is fed by the nursing staff but when she is fed by family she  is 100%. Spoke to Psychiatry again on 12/29/20, recommended risperidone, continue depakote, continue Haldol IV as needed, please reach out to psych for further management Due to very difficult situation, palliative following Family interested as well for further [place] discussion      Estimated body mass index is 33.48 kg/m as calculated from the following:   Height as of this encounter: 5' 6"  (1.676 m).   Weight as of this encounter: 94.1 kg.    Procedures: None  Consultations: PCCM Cardiology Psychiatry IR  Discharge Exam: BP 102/71   Pulse 83   Temp (!) 97 F (36.1 C) (Axillary)   Resp 17   Ht 5' 6"  (1.676 m)   Wt 94.1 kg   SpO2 99%   BMI 33.48 kg/m   General: alert, awake, confused, intermittent agitation  Cardiovascular: S1, S2 present Respiratory: CTAB Abdomen: Soft, nontender, nondistended, bowel sounds present, PEG site noted, no drainage noted Musculoskeletal: No bilateral pedal edema noted Skin: Normal Psychiatry: Unstable mood    Discharge Instructions You were cared for by a hospitalist during your hospital stay. If you have any questions about your discharge medications or the care you received while you were in the hospital after you are discharged, you can call the unit and asked to speak with the hospitalist on call if the hospitalist that took care of you is not available. Once you are discharged, your primary care physician will handle any further medical issues. Please note that NO REFILLS for any discharge medications will be authorized once you are discharged, as it is imperative that you return to your primary care physician (or establish a relationship with a primary care physician if you do not have one) for your aftercare needs so that they can reassess your need for medications and monitor your lab values.  Discharge Instructions     Diet - low sodium heart healthy   Complete by: As directed    Discharge wound care:   Complete by: As directed     Wound care  Every shift      Comments: Cleanse the abdomen of the PEG tube drainage with soap and water. Place a foam dressing over it. Perform at least twice daily. Can place a folded gauze over the PEG opening if needed to assist with drainage collection.   Increase activity slowly   Complete by: As directed       Allergies as of 01/02/2021       Reactions   Other Anaphylaxis, Hives   Hair Glue   Codeine Nausea And Vomiting   Latex Hives   Nitrofurantoin Rash        Medication List     STOP taking these medications    folic acid 1 MG tablet Commonly known as: FOLVITE   HYDROcodone-acetaminophen 7.5-325 MG tablet Commonly known as: NORCO   meloxicam 15 MG tablet Commonly known as: MOBIC   nirmatrelvir & ritonavir 20 x 150 MG & 10 x 100MG Tbpk Commonly known as: PAXLOVID   phentermine 37.5 MG tablet Commonly known as: ADIPEX-P   tiZANidine 4 MG tablet Commonly known as: ZANAFLEX   Trulicity 4.5 XI/3.3AS Sopn Generic drug: Dulaglutide   Vitamin D (Ergocalciferol) 1.25 MG (50000 UNIT) Caps capsule Commonly known  as: DRISDOL       TAKE these medications    amiodarone 200 MG tablet Commonly known as: PACERONE Take 1 tablet (200 mg total) by mouth 2 (two) times daily.   carvedilol 6.25 MG tablet Commonly known as: COREG Take 1 tablet (6.25 mg total) by mouth 2 (two) times daily with a meal.   cephALEXin 500 MG capsule Commonly known as: KEFLEX Take 1 capsule (500 mg total) by mouth every 8 (eight) hours.   divalproex 125 MG capsule Commonly known as: DEPAKOTE SPRINKLE Take 2 capsules (250 mg total) by mouth every 8 (eight) hours.   enoxaparin 40 MG/0.4ML injection Commonly known as: LOVENOX Inject 0.4 mLs (40 mg total) into the skin daily. Start taking on: January 03, 2021   feeding supplement Liqd Take 237 mLs by mouth 3 (three) times daily between meals.   haloperidol lactate 5 MG/ML injection Commonly known as: HALDOL Inject 1 mL (5 mg  total) into the muscle every 6 (six) hours as needed.   haloperidol lactate 5 MG/ML injection Commonly known as: HALDOL Inject 1 mL (5 mg total) into the vein every 6 (six) hours as needed.   insulin aspart 100 UNIT/ML injection Commonly known as: novoLOG Inject 0-20 Units into the skin every 4 (four) hours.   insulin glargine-yfgn 100 UNIT/ML injection Commonly known as: SEMGLEE Inject 0.1 mLs (10 Units total) into the skin 2 (two) times daily.   Linzess 290 MCG Caps capsule Generic drug: linaclotide Take 290 mcg by mouth daily.   LORazepam 2 MG/ML injection Commonly known as: ATIVAN Inject 0.5 mLs (1 mg total) into the vein every 4 (four) hours as needed for anxiety (agitation).   losartan 25 MG tablet Commonly known as: COZAAR Take 1 tablet (25 mg total) by mouth daily. Start taking on: January 03, 2021   multivitamin with minerals Tabs tablet Take 1 tablet by mouth daily. Start taking on: January 03, 2021   nicotine 21 mg/24hr patch Commonly known as: NICODERM CQ - dosed in mg/24 hours Place 1 patch (21 mg total) onto the skin daily. Start taking on: January 03, 2021   pantoprazole 40 MG tablet Commonly known as: PROTONIX Take 40 mg by mouth 2 (two) times daily.   ProAir HFA 108 (90 Base) MCG/ACT inhaler Generic drug: albuterol Inhale 1-2 puffs into the lungs every 4 (four) hours as needed.   risperiDONE 1 MG disintegrating tablet Commonly known as: RISPERDAL M-TABS Take 1 tablet (1 mg total) by mouth 2 (two) times daily.               Discharge Care Instructions  (From admission, onward)           Start     Ordered   01/02/21 0000  Discharge wound care:       Comments: Wound care  Every shift      Comments: Cleanse the abdomen of the PEG tube drainage with soap and water. Place a foam dressing over it. Perform at least twice daily. Can place a folded gauze over the PEG opening if needed to assist with drainage collection.   01/02/21 1043            Allergies  Allergen Reactions   Other Anaphylaxis and Hives    Hair Glue   Codeine Nausea And Vomiting   Latex Hives   Nitrofurantoin Rash      The results of significant diagnostics from this hospitalization (including imaging, microbiology, ancillary and laboratory) are listed below for  reference.    Significant Diagnostic Studies: CT ABDOMEN WO CONTRAST  Result Date: 12/12/2020 CLINICAL DATA:  Dysphasia, preop planning for gastrostomy tube EXAM: CT ABDOMEN WITHOUT CONTRAST TECHNIQUE: Multidetector CT imaging of the abdomen was performed following the standard protocol without IV contrast. COMPARISON:  08/07/2019 FINDINGS: Lower chest: Trace pleural fluid. Mild atelectasis posteriorly in the lung bases. Hepatobiliary: No focal liver abnormality is seen. No gallstones, gallbladder wall thickening, or biliary dilatation. Pancreas: Unremarkable. No pancreatic ductal dilatation or surrounding inflammatory changes. Spleen: Normal in size without focal abnormality. Adrenals/Urinary Tract: Adrenal glands unremarkable. Stable left renal cysts. No hydronephrosis or urolithiasis. Stomach/Bowel: Stomach is nondistended. There is a small window for percutaneous gastrostomy placement evident. Visualized small bowel and colon are nondilated. Feeding tube extends to the pylorus. Vascular/Lymphatic: Subcentimeter left para-aortic lymph nodes as before. Minimal calcified plaque in the proximal right common iliac artery. Other: No abdominal ascites.  No free air. Musculoskeletal: No acute or significant osseous findings. IMPRESSION: 1. Small window present for percutaneous gastrostomy placement. Consider preprocedure barium for colonic opacification. Electronically Signed   By: Lucrezia Europe M.D.   On: 12/12/2020 08:20   DG Chest 1 View  Result Date: 12/04/2020 CLINICAL DATA:  Cardiac arrest.  Possible pneumonia. EXAM: CHEST  1 VIEW COMPARISON:  12/02/2020 FINDINGS: Endotracheal tube is in place,  tip approximately 2.8 centimeters above the carina. Feeding tube is in place, tip beyond the stomach. LEFT IJ central line tip overlies the LOWER superior vena cava/RIGHT atrium. Heart size is accentuated by technique, probably UPPER limits normal. There is increased opacity at the RIGHT lung base, consistent with developing infiltrate. No evidence for edema. IMPRESSION: Increasing RIGHT LOWER lobe infiltrate. Electronically Signed   By: Nolon Nations M.D.   On: 12/04/2020 12:15   DG Abd 1 View  Result Date: 12/13/2020 CLINICAL DATA:  Gastrostomy placement today.  Barium administered. EXAM: ABDOMEN - 1 VIEW COMPARISON:  CT Abdomen Pelvis, 12/11/2020. FINDINGS: Support lines: Enteric feeding tube, with tip at the proximal duodenum. Nonobstructive bowel gas pattern. Colonic opacification of administered enteric contrast. Sigmoid diverticulosis. No interval osseous abnormality. IMPRESSION: 1. Adequate preprocedural colonic opacification with barium. 2. Sigmoid diverticulosis. 3. Lines and tubes as above. Electronically Signed   By: Michaelle Birks M.D.   On: 12/13/2020 08:43   CT HEAD WO CONTRAST (5MM)  Result Date: 12/03/2020 CLINICAL DATA:  Mental status change EXAM: CT HEAD WITHOUT CONTRAST TECHNIQUE: Contiguous axial images were obtained from the base of the skull through the vertex without intravenous contrast. COMPARISON:  12/01/2020 FINDINGS: Brain: No acute infarct or hemorrhage. Lateral ventricles and midline structures are stable. No acute extra-axial fluid collections. No mass effect. Vascular: No hyperdense vessel or unexpected calcification. Skull: Normal. Negative for fracture or focal lesion. Sinuses/Orbits: Mild mucosal thickening within the ethmoid and sphenoid sinuses likely due to intubation and enteric catheter placement. Remaining sinuses are clear. Other: None. IMPRESSION: 1. No acute intracranial process. Electronically Signed   By: Randa Ngo M.D.   On: 12/03/2020 20:20   MR BRAIN  WO CONTRAST  Result Date: 12/04/2020 CLINICAL DATA:  Anoxic brain damage EXAM: MRI HEAD WITHOUT CONTRAST TECHNIQUE: Multiplanar, multiecho pulse sequences of the brain and surrounding structures were obtained without intravenous contrast. COMPARISON:  None. FINDINGS: Brain: Areas of cortical reduced diffusion bilaterally. Additional involvement of the hippocampi. No evidence of intracranial hemorrhage. No significant mass effect. No herniation. No hydrocephalus. Vascular: Major vessel flow voids at the skull base are preserved. Skull and upper cervical spine: Normal  marrow signal is preserved. Sinuses/Orbits: Paranasal sinuses are aerated. Orbits are unremarkable. Other: Sella is unremarkable.  Mastoid air cells are clear. IMPRESSION: Areas of bilateral cerebral cortical reduced diffusion. Reduced diffusion along the hippocampi. This is consistent with hypoxic/ischemic injury. Electronically Signed   By: Macy Mis M.D.   On: 12/04/2020 13:43   MR CERVICAL SPINE WO CONTRAST  Result Date: 12/04/2020 CLINICAL DATA:  Anoxic brain damage, cervical trauma EXAM: MRI CERVICAL SPINE WITHOUT CONTRAST TECHNIQUE: Multiplanar, multisequence MR imaging of the cervical spine was performed. No intravenous contrast was administered. COMPARISON:  Correlation made with CT 12/01/2020 FINDINGS: Alignment: No significant listhesis. Vertebrae: Vertebral body heights are maintained. Mild marrow edema at the C6-C7 endplates probably degenerative. Cord: Normal caliber and signal. Posterior Fossa, vertebral arteries, paraspinal tissues: Unremarkable. Disc levels: C2-C3:  No canal or foraminal stenosis. C3-C4:  Small central protrusion.  No canal or foraminal stenosis. C4-C5: Minimal disc bulge with endplate osteophytes and small central disc protrusion. Mild right facet hypertrophy. No canal or foraminal stenosis. C5-C6: Minimal disc bulge with endplate osteophytes. Mild uncovertebral hypertrophy. No canal or foraminal stenosis.  C6-C7: A minimal disc bulge with left central protrusion endplate osteophytes. Mild uncovertebral hypertrophy. Minor canal stenosis. No foraminal stenosis. C7-T1:  No canal or foraminal stenosis. IMPRESSION: No evidence of soft tissue or ligamentous injury. Mild degenerative changes. Electronically Signed   By: Macy Mis M.D.   On: 12/04/2020 13:50   CT ABDOMEN PELVIS W CONTRAST  Result Date: 12/17/2020 CLINICAL DATA:  Abdominal pain and fever. Status post placement of percutaneous gastrostomy tube on 12/13/2020. EXAM: CT ABDOMEN AND PELVIS WITH CONTRAST TECHNIQUE: Multidetector CT imaging of the abdomen and pelvis was performed using the standard protocol following bolus administration of intravenous contrast. CONTRAST:  60m OMNIPAQUE IOHEXOL 350 MG/ML SOLN COMPARISON:  CT of the abdomen without contrast on 12/11/2020 FINDINGS: Lower chest: No acute abnormality. Hepatobiliary: No focal liver abnormality is seen. No gallstones, gallbladder wall thickening, or biliary dilatation. Pancreas: Unremarkable. No pancreatic ductal dilatation or surrounding inflammatory changes. Spleen: Normal in size without focal abnormality. Adrenals/Urinary Tract: Adrenal glands are unremarkable. Kidneys are normal, without renal calculi, focal lesion, or hydronephrosis. Bladder is unremarkable. Stomach/Bowel: Percutaneous gastrostomy tube in appropriate position with the retention bumper at the level of the anterior gastric wall in the distal body of the stomach. Stranding and edema in the subcutaneous fat surrounding the region of gastrostomy tube placement may relate to a mild amount of bleeding at the time of tube placement with no evidence of focal abscess or discrete fluid collection. However, some degree cellulitis cannot be excluded. No evidence of bowel obstruction, ileus or free intraperitoneal air. Retained contrast is present in the colon. No evidence of colonic injury or fistula. Vascular/Lymphatic: No significant  vascular findings are present. No enlarged abdominal or pelvic lymph nodes. Reproductive: Status post hysterectomy. No adnexal masses. Other: Midline ventral hernia inferior to the umbilicus contains fat. No focal abscess or ascites. Musculoskeletal: No acute or significant osseous findings. IMPRESSION: Normal positioning of recently placed gastrostomy tube within the body of the stomach. Stranding and edema in the subcutaneous fat surrounding the region of gastrostomy tube placement may relate to a mild amount of bleeding at the time of tube placement with no evidence of focal abscess or discrete fluid collection. However, some degree cellulitis cannot be excluded. Electronically Signed   By: GAletta EdouardM.D.   On: 12/17/2020 15:35   IR GASTROSTOMY TUBE MOD SED  Result Date: 12/14/2020 INDICATION: Anoxic  encephalopathy Dysphagia EXAM: Fluoroscopic guided gastrostomy tube placement MEDICATIONS: Ancef 2 g IV; Antibiotics were administered within 1 hour of the procedure. Glucagon 1 mg IV ANESTHESIA/SEDATION: Versed 3 mg IV; Fentanyl 125 mcg IV Moderate Sedation Time:  32 minutes The patient was continuously monitored during the procedure by the interventional radiology nurse under my direct supervision. CONTRAST:  15 mL of Omnipaque 240-administered into the gastric lumen. FLUOROSCOPY TIME:  Fluoroscopy Time: 2 minutes 30 seconds (21 mGy). COMPLICATIONS: None immediate. PROCEDURE: Informed written consent was obtained from the patient after a thorough discussion of the procedural risks, benefits and alternatives. All questions were addressed. Maximal Sterile Barrier Technique was utilized including caps, mask, sterile gowns, sterile gloves, sterile drape, hand hygiene and skin antiseptic. A timeout was performed prior to the initiation of the procedure. An orogastric tube was placed with fluoroscopic guidance. The anterior abdomen was prepped and draped in sterile fashion. Ultrasound evaluation of the left  upper quadrant was performed to confirm the position of the liver. The skin and subcutaneous tissues were anesthetized with 1% lidocaine. Single gastropexy was placed. 17 gauge needle was directed into the distended stomach with fluoroscopic guidance. A wire was advanced into the stomach. 9-French vascular sheath was placed and the orogastric tube was snared using a [place]ck snare device. The orogastric tube and snare were pulled out of the patient's mouth. The snare device was connected to a 20-French gastrostomy tube. The snare device and gastrostomy tube were pulled through the patient's mouth and out the anterior abdominal wall. The gastrostomy tube was cut to an appropriate length. Contrast injection through gastrostomy tube confirmed placement within the stomach. The gastropexy was removed. Fluoroscopic images were obtained for documentation. The gastrostomy tube was flushed with normal saline. IMPRESSION: 57 French pull-through gastrostomy tube placed. Electronically Signed   By: Miachel Roux M.D.   On: 12/14/2020 08:33   DG Chest Port 1 View  Result Date: 12/18/2020 CLINICAL DATA:  Ventricular fibrillation EXAM: PORTABLE CHEST 1 VIEW COMPARISON:  12/05/2020 FINDINGS: Normal heart size when allowing for portable technique. Unremarkable mediastinal contours. Artifact from EKG leads. There is no edema, consolidation, effusion, or pneumothorax. IMPRESSION: Low volume chest without acute finding. Electronically Signed   By: Monte Fantasia M.D.   On: 12/18/2020 07:11   DG CHEST PORT 1 VIEW  Result Date: 12/05/2020 CLINICAL DATA:  Cardiac arrest.  ET tube EXAM: PORTABLE CHEST 1 VIEW COMPARISON:  12/05/2019 FINDINGS: Endotracheal tube, left central line, feeding tube remain in place, unchanged. Mild cardiomegaly, vascular congestion. Old improving infiltrate at the right lung base. No confluent airspace opacities or effusions or overt edema. IMPRESSION: Support devices stable. Improving right basilar  infiltrate. Borderline cardiomegaly, vascular congestion. Electronically Signed   By: Rolm Baptise M.D.   On: 12/05/2020 16:30   DG Abd Portable 1V  Result Date: 12/07/2020 CLINICAL DATA:  Feeding tube placement EXAM: PORTABLE ABDOMEN - 1 VIEW COMPARISON:  KUB 12/02/2020 FINDINGS: Enteric catheter is in place with the tip projecting over the expected region of the pylorus/first portion of the duodenum. There is a relative paucity of bowel gas in the imaged abdomen without evidence of mechanical obstruction. The bones are stable. IMPRESSION: Enteric catheter tip projects over the expected location of the pylorus/first portion of the duodenum. Electronically Signed   By: Valetta Mole M.D.   On: 12/07/2020 11:11   US THYROID  Result Date: 12/07/2020 CLINICAL DATA:  Goiter. EXAM: THYROID ULTRASOUND TECHNIQUE: Ultrasound examination of the thyroid gland and adjacent soft tissues  was performed. COMPARISON:  06/02/2004 FINDINGS: Parenchymal Echotexture: Mildly heterogeneous Isthmus: 1.4 cm Right lobe: 5.9 x 3.3 x 2.9 cm Left lobe: 5.7 x 2.9 x 3.1 cm _________________________________________________________ Estimated total number of nodules >/= 1 cm: 1 Number of spongiform nodules >/=  2 cm not described below (TR1): 0 Number of mixed cystic and solid nodules >/= 1.5 cm not described below ([place]): 0 _________________________________________________________ Nodule # 1: Location: Right; Inferior Maximum size: 2.3 cm; Other 2 dimensions: 1.9 x 1.9 cm, previously measured 1.2 x 1.0 x 1.1 cm in 2006. Composition: solid/almost completely solid (2) Echogenicity: hypoechoic (2) Shape: not taller-than-wide (0) Margins: ill-defined (0) Echogenic foci: none (0) ACR TI-RADS total points: 4. ACR TI-RADS risk category: TR4 (4-6 points). ACR TI-RADS recommendations: **Given size (>/= 1.5 cm) and appearance, fine needle aspiration of this moderately suspicious nodule should be considered based on TI-RADS criteria.  _________________________________________________________ IMPRESSION: Nodule 1 (TI-RADS 4) located in the inferior right thyroid lobe, meets criteria for FNA. This nodule demonstrate interval threshold growth since 2006. The above is in keeping with the ACR TI-RADS recommendations - J Am Coll Radiol 2017;14:587-595. Electronically Signed   By: Miachel Roux M.D.   On: 12/07/2020 10:55    Microbiology: No results found for this or any previous visit (from the past 240 hour(s)).   Labs: Basic Metabolic Panel: Recent Labs  Lab 12/28/20 0041 12/29/20 0343  NA 138 138  K 4.1 4.2  CL 106 108  CO2 22 22  GLUCOSE 118* 332*  BUN 16 18  CREATININE 0.92 1.06*  CALCIUM 9.1 8.8*   Liver Function Tests: No results for input(s): AST, ALT, ALKPHOS, BILITOT, PROT, ALBUMIN in the last 168 hours. No results for input(s): LIPASE, AMYLASE in the last 168 hours. No results for input(s): AMMONIA in the last 168 hours. CBC: Recent Labs  Lab 12/26/20 1356 12/29/20 0343  WBC 9.8 6.1  NEUTROABS 5.6 2.9  HGB 11.4* 11.3*  HCT 35.9* 36.1  MCV 71.9* 72.2*  PLT 451* 346   Cardiac Enzymes: No results for input(s): CKTOTAL, CKMB, CKMBINDEX, TROPONINI in the last 168 hours. BNP: BNP (last 3 results) Recent Labs    12/01/20 0913  BNP 37.2    ProBNP (last 3 results) No results for input(s): PROBNP in the last 8760 hours.  CBG: Recent Labs  Lab 01/01/21 1631 01/01/21 2112 01/02/21 0127 01/02/21 0421 01/02/21 0804  GLUCAP 150* 137* 141* 161* 227*       Signed:  Alma Friendly, MD [facility] 01/02/2021, 10:43 AM

## 2021-01-02 NOTE — Progress Notes (Signed)
PMR Admission Coordinator Pre-Admission Assessment   Patient: Courtney Davies is an 45 y.o., female MRN: [id number] DOB: [birth year] Height: _0  (167.6 cm) Weight: 94.1 kg   Insurance Information HMO:     PPO:      PCP:      IPA:      80/20:      OTHER:  PRIMARY: U[employer] Medicaid      Policy#: [id number]      Subscriber: pt CM Name: Calley      Phone#: not provided     Fax#: [phone] Pre-Cert#: [id number] auth for CIR via fax from [place] with updates due to [place] (ph [phone]) on 9/22 at fax listed above      Employer:  Benefits:  Phone #: [phone]     Name:  Eff. Date: 10/14/20     Deduct: $0      Out of Pocket Max: $0      Life Max:  CIR:       SNF:  Outpatient:      Co-Pay:  Home Health:       Co-Pay:  DME:      Co-Pay:  Providers:  SECONDARY:       Policy#:      Phone#:    Development worker, community:       Phone#:    The Engineer, petroleum" for patients in Inpatient Rehabilitation Facilities with attached "Privacy Act [place] Records" was provided and verbally reviewed with: N/A   Emergency Contact Information Contact Information       Name Relation Home Work Mobile    Johnson,[name] Sister     [phone]    [place],[name] Sister     [phone]    Dacy, Enrico Mother     [phone]    Bilton,[name] Father     [phone]           Current Medical History  Patient Admitting Diagnosis: Anoxic BI   History of Present Illness: Pt is a 45 y/o female with PMH significant for HTN, B12 deficiency, covid 4 wks PTA, and OSA, who was admitted to [facility] on 12/01/20 for witnessed v-fib arrest at work.  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  CPR was initiated and she received 5 rounds of epi, 5 episodes of defib, and 400 mg of amio.  Epi drip was started with achievement of ROSC.  On arrival to ED she was intubated, EKG showed ST, HS troponin 65, K3.7, sodium 136.  She tested covid  positive.  CT head/spine showed no acute abnormalities, CTA chest showed no PE but did show bilateral lower lobe consolidations.  Cardiology was consulted and recommended IV abx, IV heparin, ECHO, but no further inpatient cardiac workup (including cath) until improvement in mental status.  Neurology was consulted due to inability to protect airway due to mental status and ongoing encephalopathy.  MRI showed ABI.  Palliative was consulted for goals of care and family wishes to proceed with aggressive intervention at this time.  ID was consulted due to ongoing leukocytosis and fevers, and workup was essentially negative.  Pt did have a PEG placed on 8/30, but she pulled it out on 9/11 and it remains out.  Therapy evaluations were completed and pt was recommended for CIR due to need for aggressive multidisciplinary therapy.    [facility]'s [facility] record from [facility] has been reviewed by the rehabilitation admission coordinator and physician.   Past  Medical History      Past Medical History:  Diagnosis Date   Anemia     Diabetes type 2, controlled ([place])     GERD (gastroesophageal reflux disease)     Hypertension        Has the patient had major surgery during 100 days prior to admission? Yes   Family History   family history is not on file.   Current Medications   Current Facility-Administered Medications:    acetaminophen (TYLENOL) 160 MG/5ML solution 650 mg, 650 mg, Oral, Q6H, Chotiner, Yevonne Aline, MD, 650 mg at 01/02/21 0841   albuterol (VENTOLIN HFA) 108 (90 Base) MCG/ACT inhaler 2 puff, 2 puff, Inhalation, Q4H PRN, Desai, Rahul P, PA-C, 2 puff at 12/07/20 2004   amiodarone (PACERONE) tablet 200 mg, 200 mg, Oral, BID, Charlynne Cousins, MD, 200 mg at 01/02/21 0842   carvedilol (COREG) tablet 6.25 mg, 6.25 mg, Per Tube, BID WC, Pokhrel, Laxman, MD, 6.25 mg at 01/02/21 0842   cephALEXin (KEFLEX) capsule 500 mg, 500 mg, Oral, Q8H, Charlynne Cousins, MD, 500 mg at 01/01/21 1303    diphenhydrAMINE (BENADRYL) capsule 50 mg, 50 mg, Oral, Q6H PRN, Chotiner, Yevonne Aline, MD, 50 mg at 12/21/20 2300   divalproex (DEPAKOTE SPRINKLE) capsule 250 mg, 250 mg, Oral, Q8H, Charlynne Cousins, MD, 250 mg at 01/01/21 1302   enoxaparin (LOVENOX) injection 40 mg, 40 mg, Subcutaneous, Q24H, Charlynne Cousins, MD, 40 mg at 01/02/21 0841   feeding supplement (ENSURE ENLIVE / ENSURE PLUS) liquid 237 mL, 237 mL, Oral, TID BM, Alma Friendly, MD, 237 mL at 01/02/21 0844   haloperidol lactate (HALDOL) injection 5 mg, 5 mg, Intramuscular, Q6H PRN, Chotiner, Yevonne Aline, MD, 5 mg at 12/29/20 1005   haloperidol lactate (HALDOL) injection 5 mg, 5 mg, Intravenous, Q6H PRN, Alma Friendly, MD, 5 mg at 01/01/21 1924   insulin aspart (novoLOG) injection 0-20 Units, 0-20 Units, Subcutaneous, Q4H, Agarwala, Einar Grad, MD, 7 Units at 01/02/21 0842   insulin glargine-yfgn (SEMGLEE) injection 10 Units, 10 Units, Subcutaneous, BID, Charlynne Cousins, MD, 10 Units at 01/02/21 0841   labetalol (NORMODYNE) injection 10 mg, 10 mg, Intravenous, Q10 min PRN, Agarwala, Ravi, MD, 10 mg at 12/07/20 1824   LORazepam (ATIVAN) injection 1 mg, 1 mg, Intravenous, Q4H PRN, Chotiner, Yevonne Aline, MD, 1 mg at 01/01/21 1630   LORazepam (ATIVAN) injection 2 mg, 2 mg, Intramuscular, Once, Chotiner, Yevonne Aline, MD   LORazepam (ATIVAN) tablet 2 mg, 2 mg, Per Tube, Q4H PRN, 2 mg at 12/22/20 0854 **OR** [DISCONTINUED] LORazepam (ATIVAN) injection 2 mg, 2 mg, Intramuscular, Q4H PRN, Charlynne Cousins, MD, 2 mg at 12/24/20 1804   losartan (COZAAR) tablet 25 mg, 25 mg, Per Tube, Daily, Sherren Mocha, MD, 25 mg at 01/02/21 8115   multivitamin with minerals tablet 1 tablet, 1 tablet, Oral, Daily, Alma Friendly, MD, 1 tablet at 01/02/21 7262   nicotine (NICODERM CQ - dosed in mg/24 hours) patch 21 mg, 21 mg, Transdermal, Daily, Aileen Fass, Tammi Klippel, MD, 21 mg at 01/02/21 0844   ondansetron (ZOFRAN) injection 4 mg, 4 mg,  Intravenous, Q6H PRN, Agarwala, Ravi, MD, 4 mg at 12/06/20 2010   pantoprazole (PROTONIX) EC tablet 40 mg, 40 mg, Oral, BID, Charlynne Cousins, MD, 40 mg at 01/02/21 0843   risperiDONE (RISPERDAL M-TABS) disintegrating tablet 1 mg, 1 mg, Oral, BID, Alma Friendly, MD, 1 mg at 01/02/21 0842   sodium chloride flush (NS) 0.9 % injection 3  mL, 3 mL, Intravenous, Q12H, Alma Friendly, MD, 3 mL at 01/02/21 0845   sodium chloride flush (NS) 0.9 % injection 3 mL, 3 mL, Intravenous, PRN, Alma Friendly, MD   Patients Current Diet:  Diet Order                  DIET DYS 3 Room service appropriate? Yes; Fluid consistency: Thin  Diet effective now                         Precautions / Restrictions Precautions Precautions: Fall Precaution Comments: incontinence, abdominal wound w/ drainage (from PEG tube) patient had fall this morning Other Brace: bil prevalon at rest Restrictions Weight Bearing Restrictions: No Other Position/Activity Restrictions: soft restraints, mitts, posey waist restraint in bed    Has the patient had 2 or more falls or a fall with injury in the past year? Yes   Prior Activity Level Community (5-7x/wk): independent prior to admit, working   Prior Functional Level Self Care: Did the patient need help bathing, dressing, using the toilet or eating? Independent   Indoor Mobility: Did the patient need assistance with walking from room to room (with or without device)? Independent   Stairs: Did the patient need assistance with internal or external stairs (with or without device)? Independent   Functional Cognition: Did the patient need help planning regular tasks such as shopping or remembering to take medications? Independent   Patient Information Are you of Hispanic, Latino/a,or Spanish origin?: X. Patient unable to respond What is your race?: X. Patient unable to respond Do you need or want an interpreter to communicate with a doctor or health  care staff?: 9. Unable to respond   Patient's Response To:  Health Literacy and Transportation Is the patient able to respond to health literacy and transportation needs?: No   Home Assistive Devices / [place] Devices/Equipment: None   Prior Device Use: Indicate devices/aids used by the patient prior to current illness, exacerbation or injury? None of the above   Current Functional Level Cognition   Arousal/Alertness: Awake/alert Overall Cognitive Status: Impaired/Different from baseline Difficult to assess due to: Impaired communication Current Attention Level: Sustained Orientation Level: Oriented to person, Oriented to situation, Disoriented to time, Disoriented to place Following Commands: Follows one step commands inconsistently, Follows one step commands with increased time Safety/Judgement: Decreased awareness of safety, Decreased awareness of deficits General Comments: initially tearful and perseverating on someone taking her money.  her mother arrived during session and was also able to guide in redirection.  pt. with a stack of family photos.  great inconsistency naming who was who but was able to recognize niece aubree everytime.  this particular family member also made her smile the most. pts. mother states she is very close with her niece.  was also able to name her other niece without error. could not recognize or name either of her sons or even herself when looking at pictures she was in.  asked mother if she could write names of each family member in picture on the back so therapy would be able to assist patient and also determine accuracy when she was naming them.  at one point she said she had 2 sisters then she pointed to pic of one of her sons and said it was her brother but used the sisters name.  she actually does have a brother [name]) but could not identify him in the pic but was  able to say "those are my niece and nephew" that were with him in the  picture Awareness: Impaired Awareness Impairment: Intellectual impairment, Emergent impairment, Anticipatory impairment Problem Solving: Impaired Problem Solving Impairment: Verbal basic, Functional basic Behaviors: Restless, Verbal agitation, Physical agitation, Poor frustration tolerance, Lability Safety/Judgment: Impaired    Extremity Assessment (includes Sensation/Coordination)   Upper Extremity Assessment: Generalized weakness RUE Deficits / Details: limited assessment due to cognition; PROM WFL with fluctuating tone and automatic grasp/non purposeful movements RUE Coordination: decreased gross motor, decreased fine motor LUE Deficits / Details: limited assessment due to cognition, PROM WFL with fluctuating tone and automatic grasp, non purposeful movements LUE Coordination: decreased fine motor, decreased gross motor  Lower Extremity Assessment: Defer to PT evaluation RLE Deficits / Details: pt with automatic movement of slight bounce of LLE in bed . pt with hip and knee flexion with noxious stimuli to feet. PROM WFL LLE Deficits / Details: pt with hip and knee flexion with noxious stimuli to feet. PROM WFL     ADLs   Overall ADL's : Needs assistance/impaired Eating/Feeding: NPO Grooming: Wash/dry face, Set up, Sitting Grooming Details (indicate cue type and reason): able to wash and wipe face without assistance or instructions Lower Body Bathing: Set up, Sitting/lateral leans Lower Body Bathing Details (indicate cue type and reason): drainage from abdominal wound had started to run down pts. leg. she was able to note it and wash and dry LLE without assistance Lower Body Dressing: Moderate assistance, Sitting/lateral leans Lower Body Dressing Details (indicate cue type and reason): Mod A overall to don socks sitting EOB with max verbal cues via backward chaining. Assistance to cross LEs sitting EOB and don around tip of toes to assist in initiating/completion of task. pt able to pull  up socks from this position with verbal cues Toilet Transfer: Minimal assistance, +2 for safety/equipment, Cueing for sequencing, Cueing for safety Toilet Transfer Details (indicate cue type and reason): able to stand from eob and take approx. 4 steps to recliner following one step commands with increased time and inconsistently Toileting- Clothing Manipulation and Hygiene: Total assistance, +2 for physical assistance, +2 for safety/equipment, Bed level Functional mobility during ADLs: Minimal assistance, +2 for safety/equipment, Cueing for sequencing, Cueing for safety General ADL Comments: Pt progressing with ADLs and OOB activities, benefits from quiet environment and multimodal cues     Mobility   Overal bed mobility: Needs Assistance Bed Mobility: Supine to Sit Rolling: Min assist, +2 for physical assistance Sidelying to sit: Supervision, HOB elevated Supine to sit: Mod assist, +2 for physical assistance Sit to supine: Mod assist, +2 for physical assistance Sit to sidelying: Min assist General bed mobility comments: tactile and verbal cues, decreased awareness of safety. Sitting on very edge of bed and unable to follow instruction to scoot hips over to center     Transfers   Overall transfer level: Needs assistance Equipment used: None, 2 person hand held assist Transfer via Lift Equipment: Maximove Transfers: Sit to/from Stand, Risk manager Sit to Stand: Min assist, +2 safety/equipment, Mod assist Stand pivot transfers: Min assist, +2 safety/equipment General transfer comment: pt. able to stand with one step command.  steps to recliner and reached for arm rest when instructed to.  increased time and physcial demo required to scoot hips back in chair and ultimately required myself and ST to use pad to fully scoot her back     Ambulation / Gait / Stairs / Wheelchair Mobility   Ambulation/Gait Ambulation/Gait assistance: Mod assist  Gait Distance (Feet): 6 Feet Assistive  device: None Gait Pattern/deviations: Step-to pattern, Decreased step length - right, Decreased step length - left General Gait Details: patient ambulated along edge of bed, would not use walker. Min assist for balance. Gait velocity: reduced Gait velocity interpretation: <1.8 ft/sec, indicate of risk for recurrent falls     Posture / Balance Dynamic Sitting Balance Sitting balance - Comments: requires supervision for safety with sitting on side of bed Balance Overall balance assessment: Needs assistance, History of Falls Sitting-balance support: Feet supported Sitting balance-Leahy Scale: Fair Sitting balance - Comments: requires supervision for safety with sitting on side of bed Postural control: Posterior lean Standing balance support: No upper extremity supported, During functional activity Standing balance-Leahy Scale: Poor Standing balance comment: requires min/mod assist for staticstanding and dynamic standing     Special needs/care consideration Behavioral consideration yes, ABI and Special service needs may benefits from veil bed    Previous Home Environment (from acute therapy documentation) Living Arrangements: [place]: No   Discharge Living Setting Plans for Discharge Living Setting: Lives with (comment) (discharge to mom's house) Does the patient have any problems obtaining your medications?: No   Social/Family/Support Systems Anticipated Caregiver: sister Clinical research associate) and mom [name]) Anticipated Caregiver's Contact Information: [name] [phone]; [name] [phone] Ability/Limitations of Caregiver: min assist Caregiver Availability: 24/7 Discharge Plan Discussed with Primary Caregiver: Yes Is Caregiver In Agreement with Plan?: Yes Does Caregiver/Family have Issues with Lodging/Transportation while Pt is in Rehab?: No   Goals Patient/Family Goal for Rehab: PT/OT supervision to min assist, SLP min to mod assist Expected length of stay: 21-24  days Pt/Family Agrees to Admission and willing to participate: Yes Program Orientation Provided & Reviewed with Pt/Caregiver Including Roles  & Responsibilities: Yes  Barriers to Discharge: Insurance for SNF coverage   Decrease burden of Care through IP rehab admission: n/a   Possible need for SNF placement upon discharge: Not anticipated. Pt with good family support who plan to take pt home.    Patient Condition: I have reviewed medical records from [facility], spoken with CM, and patient and family member. I met with patient at the bedside and discussed via phone for inpatient rehabilitation assessment.  Patient will benefit from ongoing PT, OT, and SLP, can actively participate in 3 hours of therapy a day 5 days of the week, and can make measurable gains during the admission.  Patient will also benefit from the coordinated team approach during an Inpatient Acute Rehabilitation admission.  The patient will receive intensive therapy as well as Rehabilitation physician, nursing, social worker, and care management interventions.  Due to bladder management, bowel management, safety, skin/wound care, disease management, medication administration, pain management, and patient education the patient requires 24 hour a day rehabilitation nursing.  The patient is currently min +2 with mobility and basic ADLs.  Discharge setting and therapy post discharge at home with home health is anticipated.  Patient has agreed to participate in the Acute Inpatient Rehabilitation Program and will admit today.   Preadmission Screen Completed By:  Michel Santee, PT, DPT 01/02/2021 10:26 AM ______________________________________________________________________   Discussed status with Dr. Letta Pate on 01/02/21  at 10:44 AM  and received approval for admission today.   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 Yes Co-Morbidities requiring supervision/potential complications: HTN , pneumonia, acute systolic CHF, Dysphagia Due to bladder management, bowel management, safety, skin/wound care, disease management, medication administration, pain management, and patient education, does the patient require 24 hr/day rehab nursing? Yes Does the patient require coordinated care of a physician, rehab nurse, PT, OT, and SLP to address physical and functional deficits in the context of the above medical diagnosis(es)? Yes Addressing deficits in the following areas: balance, endurance, locomotion, strength, transferring, bowel/bladder control, bathing, dressing, feeding, grooming, toileting, cognition, and psychosocial support Can the patient actively participate in an intensive therapy program of at least 3 hrs of therapy 5 days a week? Yes The potential for patient to make measurable gains while on inpatient rehab is excellent Anticipated functional outcomes upon discharge from inpatient rehab: supervision PT, supervision OT, supervision SLP Estimated rehab length of stay to reach the above functional goals is: 20-23d Anticipated discharge destination: Home 10. Overall Rehab/Functional Prognosis: good     MD Signature: Charlett Blake M.D. [employer] [employer] Fellow Am Acad of Phys Med and Rehab Diplomate Am Board of Electrodiagnostic Med Fellow Am Board of Interventional Pain

## 2021-01-02 NOTE — Progress Notes (Signed)
Patient arrived to unit and oriented to unit routines. Shown how to call for assistance but is currently forgetful. Posey belt in place and family at the bedside.

## 2021-01-02 NOTE — Progress Notes (Signed)
Inpatient Rehab Admissions Coordinator:    I have insurance approval and a bed available for pt to admit to CIR today. Dr. Horris Latino in agreement.  Will let pt/family and TOC team know.   Shann Medal, PT, DPT Admissions Coordinator [phone] 01/02/21  10:20 AM

## 2021-01-02 NOTE — PMR Pre-admission (Signed)
PMR Admission Coordinator Pre-Admission Assessment   Patient: Courtney Davies is an 45 y.o., female MRN: [id number] DOB: 08/07/1975 Height: 5' 6" (167.6 cm) Weight: 94.1 kg   Insurance Information HMO:     PPO:      PCP:      IPA:      80/20:      OTHER:  PRIMARY: [employer] Medicaid      Policy#: [id number]      Subscriber: pt CM Name: [facility]      Phone#: not provided     Fax#: [phone] Pre-Cert#: [id number] auth for CIR via fax from [facility] with updates due to Angelina (ph [phone]) on 9/22 at fax listed above      Employer:  Benefits:  Phone #: [phone]     Name:  Eff. Date: 10/14/20     Deduct: $0      Out of Pocket Max: $0      Life Max:  CIR:       SNF:  Outpatient:      Co-Pay:  Home Health:       Co-Pay:  DME:      Co-Pay:  Providers:  SECONDARY:       Policy#:      Phone#:    Financial Counselor:       Phone#:    The "Data Collection Information Summary" for patients in Inpatient Rehabilitation Facilities with attached "Privacy Act Statement-Health Care Records" was provided and verbally reviewed with: N/A   Emergency Contact Information Contact Information       Name Relation Home Work Mobile    Johnson,[name] Sister     [phone]    Clayburn,[name] Sister     [phone]    Resendes,Barbara Mother     [phone]    Raj,[name] Father     [phone]           Current Medical History  Patient Admitting Diagnosis: Anoxic BI   History of Present Illness: Pt is a 45 y/o female with PMH significant for HTN, B12 deficiency, covid 4 wks PTA, and OSA, who was admitted to [facility] on 12/01/20 for witnessed v-fib arrest at work.  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  CPR was initiated and she received 5 rounds of epi, 5 episodes of defib, and 400 mg of amio.  Epi drip was started with achievement of ROSC.  On arrival to ED she was intubated, EKG showed ST, HS troponin 65, K3.7, sodium 136.  She tested covid  positive.  CT head/spine showed no acute abnormalities, CTA chest showed no PE but did show bilateral lower lobe consolidations.  Cardiology was consulted and recommended IV abx, IV heparin, ECHO, but no further inpatient cardiac workup (including cath) until improvement in mental status.  Neurology was consulted due to inability to protect airway due to mental status and ongoing encephalopathy.  MRI showed ABI.  Palliative was consulted for goals of care and family wishes to proceed with aggressive intervention at this time.  ID was consulted due to ongoing leukocytosis and fevers, and workup was essentially negative.  Pt did have a PEG placed on 8/30, but she pulled it out on 9/11 and it remains out.  Therapy evaluations were completed and pt was recommended for CIR due to need for aggressive multidisciplinary therapy.    Patient's medical record from [facility] has been reviewed by the rehabilitation admission coordinator and physician.   Past   Medical History      Past Medical History:  Diagnosis Date   Anemia     Diabetes type 2, controlled (HCC)     GERD (gastroesophageal reflux disease)     Hypertension        Has the patient had major surgery during 100 days prior to admission? Yes   Family History   family history is not on file.   Current Medications   Current Facility-Administered Medications:    acetaminophen (TYLENOL) 160 MG/5ML solution 650 mg, 650 mg, Oral, Q6H, Chotiner, Bradley S, MD, 650 mg at 01/02/21 0841   albuterol (VENTOLIN HFA) 108 (90 Base) MCG/ACT inhaler 2 puff, 2 puff, Inhalation, Q4H PRN, Desai, Rahul P, PA-C, 2 puff at 12/07/20 2004   amiodarone (PACERONE) tablet 200 mg, 200 mg, Oral, BID, Feliz Ortiz, Abraham, MD, 200 mg at 01/02/21 0842   carvedilol (COREG) tablet 6.25 mg, 6.25 mg, Per Tube, BID WC, Pokhrel, Laxman, MD, 6.25 mg at 01/02/21 0842   cephALEXin (KEFLEX) capsule 500 mg, 500 mg, Oral, Q8H, Feliz Ortiz, Abraham, MD, 500 mg at 01/01/21 1303    diphenhydrAMINE (BENADRYL) capsule 50 mg, 50 mg, Oral, Q6H PRN, Chotiner, Bradley S, MD, 50 mg at 12/21/20 2300   divalproex (DEPAKOTE SPRINKLE) capsule 250 mg, 250 mg, Oral, Q8H, Feliz Ortiz, Abraham, MD, 250 mg at 01/01/21 1302   enoxaparin (LOVENOX) injection 40 mg, 40 mg, Subcutaneous, Q24H, Feliz Ortiz, Abraham, MD, 40 mg at 01/02/21 0841   feeding supplement (ENSURE ENLIVE / ENSURE PLUS) liquid 237 mL, 237 mL, Oral, TID BM, Ezenduka, Nkeiruka J, MD, 237 mL at 01/02/21 0844   haloperidol lactate (HALDOL) injection 5 mg, 5 mg, Intramuscular, Q6H PRN, Chotiner, Bradley S, MD, 5 mg at 12/29/20 1005   haloperidol lactate (HALDOL) injection 5 mg, 5 mg, Intravenous, Q6H PRN, Ezenduka, Nkeiruka J, MD, 5 mg at 01/01/21 1924   insulin aspart (novoLOG) injection 0-20 Units, 0-20 Units, Subcutaneous, Q4H, Agarwala, Ravi, MD, 7 Units at 01/02/21 0842   insulin glargine-yfgn (SEMGLEE) injection 10 Units, 10 Units, Subcutaneous, BID, Feliz Ortiz, Abraham, MD, 10 Units at 01/02/21 0841   labetalol (NORMODYNE) injection 10 mg, 10 mg, Intravenous, Q10 min PRN, Agarwala, Ravi, MD, 10 mg at 12/07/20 1824   LORazepam (ATIVAN) injection 1 mg, 1 mg, Intravenous, Q4H PRN, Chotiner, Bradley S, MD, 1 mg at 01/01/21 1630   LORazepam (ATIVAN) injection 2 mg, 2 mg, Intramuscular, Once, Chotiner, Bradley S, MD   LORazepam (ATIVAN) tablet 2 mg, 2 mg, Per Tube, Q4H PRN, 2 mg at 12/22/20 0854 **OR** [DISCONTINUED] LORazepam (ATIVAN) injection 2 mg, 2 mg, Intramuscular, Q4H PRN, Feliz Ortiz, Abraham, MD, 2 mg at 12/24/20 1804   losartan (COZAAR) tablet 25 mg, 25 mg, Per Tube, Daily, Cooper, Michael, MD, 25 mg at 01/02/21 0842   multivitamin with minerals tablet 1 tablet, 1 tablet, Oral, Daily, Ezenduka, Nkeiruka J, MD, 1 tablet at 01/02/21 0842   nicotine (NICODERM CQ - dosed in mg/24 hours) patch 21 mg, 21 mg, Transdermal, Daily, Feliz Ortiz, Abraham, MD, 21 mg at 01/02/21 0844   ondansetron (ZOFRAN) injection 4 mg, 4 mg,  Intravenous, Q6H PRN, Agarwala, Ravi, MD, 4 mg at 12/06/20 2010   pantoprazole (PROTONIX) EC tablet 40 mg, 40 mg, Oral, BID, Feliz Ortiz, Abraham, MD, 40 mg at 01/02/21 0843   risperiDONE (RISPERDAL M-TABS) disintegrating tablet 1 mg, 1 mg, Oral, BID, Ezenduka, Nkeiruka J, MD, 1 mg at 01/02/21 0842   sodium chloride flush (NS) 0.9 % injection 3   mL, 3 mL, Intravenous, Q12H, Ezenduka, Nkeiruka J, MD, 3 mL at 01/02/21 0845   sodium chloride flush (NS) 0.9 % injection 3 mL, 3 mL, Intravenous, PRN, Ezenduka, Nkeiruka J, MD   Patients Current Diet:  Diet Order                  DIET DYS 3 Room service appropriate? Yes; Fluid consistency: Thin  Diet effective now                         Precautions / Restrictions Precautions Precautions: Fall Precaution Comments: incontinence, abdominal wound w/ drainage (from PEG tube) patient had fall this morning Other Brace: bil prevalon at rest Restrictions Weight Bearing Restrictions: No Other Position/Activity Restrictions: soft restraints, mitts, posey waist restraint in bed    Has the patient had 2 or more falls or a fall with injury in the past year? Yes   Prior Activity Level Community (5-7x/wk): independent prior to admit, working   Prior Functional Level Self Care: Did the patient need help bathing, dressing, using the toilet or eating? Independent   Indoor Mobility: Did the patient need assistance with walking from room to room (with or without device)? Independent   Stairs: Did the patient need assistance with internal or external stairs (with or without device)? Independent   Functional Cognition: Did the patient need help planning regular tasks such as shopping or remembering to take medications? Independent   Patient Information Are you of Hispanic, Latino/a,or Spanish origin?: X. Patient unable to respond What is your race?: X. Patient unable to respond Do you need or want an interpreter to communicate with a doctor or health  care staff?: 9. Unable to respond   Patient's Response To:  Health Literacy and Transportation Is the patient able to respond to health literacy and transportation needs?: No   Home Assistive Devices / Equipment Home Assistive Devices/Equipment: None   Prior Device Use: Indicate devices/aids used by the patient prior to current illness, exacerbation or injury? None of the above   Current Functional Level Cognition   Arousal/Alertness: Awake/alert Overall Cognitive Status: Impaired/Different from baseline Difficult to assess due to: Impaired communication Current Attention Level: Sustained Orientation Level: Oriented to person, Oriented to situation, Disoriented to time, Disoriented to place Following Commands: Follows one step commands inconsistently, Follows one step commands with increased time Safety/Judgement: Decreased awareness of safety, Decreased awareness of deficits General Comments: initially tearful and perseverating on someone taking her money.  her mother arrived during session and was also able to guide in redirection.  pt. with a stack of family photos.  great inconsistency naming who was who but was able to recognize niece aubree everytime.  this particular family member also made her smile the most. pts. mother states she is very close with her niece.  was also able to name her other niece without error. could not recognize or name either of her sons or even herself when looking at pictures she was in.  asked mother if she could write names of each family member in picture on the back so therapy would be able to assist patient and also determine accuracy when she was naming them.  at one point she said she had 2 sisters then she pointed to pic of one of her sons and said it was her brother but used the sisters name.  she actually does have a brother (aubrees dad) but could not identify him in the pic but was   able to say "those are my niece and nephew" that were with him in the  picture Awareness: Impaired Awareness Impairment: Intellectual impairment, Emergent impairment, Anticipatory impairment Problem Solving: Impaired Problem Solving Impairment: Verbal basic, Functional basic Behaviors: Restless, Verbal agitation, Physical agitation, Poor frustration tolerance, Lability Safety/Judgment: Impaired    Extremity Assessment (includes Sensation/Coordination)   Upper Extremity Assessment: Generalized weakness RUE Deficits / Details: limited assessment due to cognition; PROM WFL with fluctuating tone and automatic grasp/non purposeful movements RUE Coordination: decreased gross motor, decreased fine motor LUE Deficits / Details: limited assessment due to cognition, PROM WFL with fluctuating tone and automatic grasp, non purposeful movements LUE Coordination: decreased fine motor, decreased gross motor  Lower Extremity Assessment: Defer to PT evaluation RLE Deficits / Details: pt with automatic movement of slight bounce of LLE in bed . pt with hip and knee flexion with noxious stimuli to feet. PROM WFL LLE Deficits / Details: pt with hip and knee flexion with noxious stimuli to feet. PROM WFL     ADLs   Overall ADL's : Needs assistance/impaired Eating/Feeding: NPO Grooming: Wash/dry face, Set up, Sitting Grooming Details (indicate cue type and reason): able to wash and wipe face without assistance or instructions Lower Body Bathing: Set up, Sitting/lateral leans Lower Body Bathing Details (indicate cue type and reason): drainage from abdominal wound had started to run down pts. leg. she was able to note it and wash and dry LLE without assistance Lower Body Dressing: Moderate assistance, Sitting/lateral leans Lower Body Dressing Details (indicate cue type and reason): Mod A overall to don socks sitting EOB with max verbal cues via backward chaining. Assistance to cross LEs sitting EOB and don around tip of toes to assist in initiating/completion of task. pt able to pull  up socks from this position with verbal cues Toilet Transfer: Minimal assistance, +2 for safety/equipment, Cueing for sequencing, Cueing for safety Toilet Transfer Details (indicate cue type and reason): able to stand from eob and take approx. 4 steps to recliner following one step commands with increased time and inconsistently Toileting- Clothing Manipulation and Hygiene: Total assistance, +2 for physical assistance, +2 for safety/equipment, Bed level Functional mobility during ADLs: Minimal assistance, +2 for safety/equipment, Cueing for sequencing, Cueing for safety General ADL Comments: Pt progressing with ADLs and OOB activities, benefits from quiet environment and multimodal cues     Mobility   Overal bed mobility: Needs Assistance Bed Mobility: Supine to Sit Rolling: Min assist, +2 for physical assistance Sidelying to sit: Supervision, HOB elevated Supine to sit: Mod assist, +2 for physical assistance Sit to supine: Mod assist, +2 for physical assistance Sit to sidelying: Min assist General bed mobility comments: tactile and verbal cues, decreased awareness of safety. Sitting on very edge of bed and unable to follow instruction to scoot hips over to center     Transfers   Overall transfer level: Needs assistance Equipment used: None, 2 person hand held assist Transfer via Lift Equipment: Maximove Transfers: Sit to/from Stand, Stand Pivot Transfers Sit to Stand: Min assist, +2 safety/equipment, Mod assist Stand pivot transfers: Min assist, +2 safety/equipment General transfer comment: pt. able to stand with one step command.  steps to recliner and reached for arm rest when instructed to.  increased time and physcial demo required to scoot hips back in chair and ultimately required myself and ST to use pad to fully scoot her back     Ambulation / Gait / Stairs / Wheelchair Mobility   Ambulation/Gait Ambulation/Gait assistance: Mod assist   Gait Distance (Feet): 6 Feet Assistive  device: None Gait Pattern/deviations: Step-to pattern, Decreased step length - right, Decreased step length - left General Gait Details: patient ambulated along edge of bed, would not use walker. Min assist for balance. Gait velocity: reduced Gait velocity interpretation: <1.8 ft/sec, indicate of risk for recurrent falls     Posture / Balance Dynamic Sitting Balance Sitting balance - Comments: requires supervision for safety with sitting on side of bed Balance Overall balance assessment: Needs assistance, History of Falls Sitting-balance support: Feet supported Sitting balance-Leahy Scale: Fair Sitting balance - Comments: requires supervision for safety with sitting on side of bed Postural control: Posterior lean Standing balance support: No upper extremity supported, During functional activity Standing balance-Leahy Scale: Poor Standing balance comment: requires min/mod assist for staticstanding and dynamic standing     Special needs/care consideration Behavioral consideration yes, ABI and Special service needs may benefits from veil bed    Previous Home Environment (from acute therapy documentation) Living Arrangements: Children Home Care Services: No   Discharge Living Setting Plans for Discharge Living Setting: Lives with (comment) (discharge to mom's house) Does the patient have any problems obtaining your medications?: No   Social/Family/Support Systems Anticipated Caregiver: sister ([name]) and mom (Barbara) Anticipated Caregiver's Contact Information: [name] [phone]; Barbara [phone] Ability/Limitations of Caregiver: min assist Caregiver Availability: 24/7 Discharge Plan Discussed with Primary Caregiver: Yes Is Caregiver In Agreement with Plan?: Yes Does Caregiver/Family have Issues with Lodging/Transportation while Pt is in Rehab?: No   Goals Patient/Family Goal for Rehab: PT/OT supervision to min assist, SLP min to mod assist Expected length of stay: 21-24  days Pt/Family Agrees to Admission and willing to participate: Yes Program Orientation Provided & Reviewed with Pt/Caregiver Including Roles  & Responsibilities: Yes  Barriers to Discharge: Insurance for SNF coverage   Decrease burden of Care through IP rehab admission: n/a   Possible need for SNF placement upon discharge: Not anticipated. Pt with good family support who plan to take pt home.    Patient Condition: I have reviewed medical records from [facility], spoken with CM, and patient and family member. I met with patient at the bedside and discussed via phone for inpatient rehabilitation assessment.  Patient will benefit from ongoing PT, OT, and SLP, can actively participate in 3 hours of therapy a day 5 days of the week, and can make measurable gains during the admission.  Patient will also benefit from the coordinated team approach during an Inpatient Acute Rehabilitation admission.  The patient will receive intensive therapy as well as Rehabilitation physician, nursing, social worker, and care management interventions.  Due to bladder management, bowel management, safety, skin/wound care, disease management, medication administration, pain management, and patient education the patient requires 24 hour a day rehabilitation nursing.  The patient is currently min +2 with mobility and basic ADLs.  Discharge setting and therapy post discharge at home with home health is anticipated.  Patient has agreed to participate in the Acute Inpatient Rehabilitation Program and will admit today.   Preadmission Screen Completed By:  Caitlin E Warren, PT, DPT 01/02/2021 10:26 AM ______________________________________________________________________   Discussed status with Dr. Jerol Rufener on 01/02/21  at 10:44 AM  and received approval for admission today.   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 Yes Co-Morbidities requiring supervision/potential complications: HTN , pneumonia, acute systolic CHF, Dysphagia Due to bladder management, bowel management, safety, skin/wound care, disease management, medication administration, pain management, and patient education, does the patient require 24 hr/day rehab nursing? Yes Does the patient require coordinated care of a physician, rehab nurse, PT, OT, and SLP to address physical and functional deficits in the context of the above medical diagnosis(es)? Yes Addressing deficits in the following areas: balance, endurance, locomotion, strength, transferring, bowel/bladder control, bathing, dressing, feeding, grooming, toileting, cognition, and psychosocial support Can the patient actively participate in an intensive therapy program of at least 3 hrs of therapy 5 days a week? Yes The potential for patient to make measurable gains while on inpatient rehab is excellent Anticipated functional outcomes upon discharge from inpatient rehab: supervision PT, supervision OT, supervision SLP Estimated rehab length of stay to reach the above functional goals is: 20-23d Anticipated discharge destination: Home 10. Overall Rehab/Functional Prognosis: good     MD Signature: Tywanna Seifer E. Tykia Mellone M.D. [facility] Fellow Am Acad of Phys Med and Rehab Diplomate Am Board of Electrodiagnostic Med Fellow Am Board of Interventional Pain 

## 2021-01-02 NOTE — Progress Notes (Signed)
Patient ID: Courtney Davies, female   DOB: [birth year], 45 y.o.   MRN: [id number]  Met with patient in room, introduced myself, and explained my role in her care while on CIR. Assisted patient with brushing teeth and toileting. Explained the rehab process, rehab schedule, and fall prevention plan. Gave additional educational handouts on Living With Diabetes, Hypertension Adult, and Migraine Headache. Patient verbalized understanding but is forgetful. Patient is resting in bed with bed alarm engaged, and Posey bed belt on and engaged. I will continue to monitor the patient while on CIR.  Dorthula Nettles, RN3, BSN, CBIS, [place], [facility], Inpatient Rehabilitation Office [phone] Cell [phone]

## 2021-01-02 NOTE — Progress Notes (Signed)
Occupational Therapy Treatment Patient Details Name: Courtney Davies MRN: [id number] DOB: [birth year] Today's Date: 01/02/2021   History of present illness 45 y/o female admitted 12/01/20 with Vfib arrest with ROSC; pt also (+) COVID. CTA negative for PE. MRI 8/21 consistent with hypoxic/ischemic injury. 8/21 RLL infiltrate seen on CXR from aspiration pna. Intubated 8/18-23 (failed initial attempt to extubate 8/19). S/p PEG placement 8/30. PMH includes B12 deficiency, DM, OA, bronchitis, dysphagia.   OT comments  Pt progressing towards established OT goals. Pt continues to present with decreased problem solving, memory, sequencing, awareness, and safety impacting her performance of ADLs. Pt performing functional mobility to/from sink with Min A for balance; one episode of LOB and requiring Mod A for correction. Pt requiring Min A for balance in stink during grooming and Max cues for recall, initiation, and sequencing of tasks. Pt both internally and externally distractible. Continue to highly recommend dc to CIR for intensive OT and will continue to follow acutely as admitted.    Recommendations for follow up therapy are one component of a multi-disciplinary discharge planning process, led by the attending physician.  Recommendations may be updated based on patient status, additional functional criteria and insurance authorization.    Follow Up Recommendations  CIR;Supervision/Assistance - 24 hour    Equipment Recommendations  Other (comment) (Defer to next venue)    Recommendations for Other Services      Precautions / Restrictions Precautions Precautions: Fall Precaution Comments: incontinence, abdominal wound w/ drainage (from PEG tube) patient had fall this morning Restrictions Weight Bearing Restrictions: No       Mobility Bed Mobility Overal bed mobility: Needs Assistance Bed Mobility: Supine to Sit     Supine to sit: Min guard     General bed mobility comments: Close Min  Guard A for safety from supine to sitting. Use of bedrails to pull into upright posture    Transfers Overall transfer level: Needs assistance Equipment used: None Transfers: Sit to/from Stand Sit to Stand: Min assist         General transfer comment: Min A for gaining balance in once in standing    Balance Overall balance assessment: Needs assistance;History of Falls Sitting-balance support: No upper extremity supported;Feet supported Sitting balance-Leahy Scale: Fair     Standing balance support: No upper extremity supported;Single extremity supported;During functional activity Standing balance-Leahy Scale: Poor Standing balance comment: reliant on physical A for balance                           ADL either performed or assessed with clinical judgement   ADL Overall ADL's : Needs assistance/impaired     Grooming: Oral care;Wash/dry face;Minimal assistance;Standing;Cueing for sequencing Grooming Details (indicate cue type and reason): Min A for standing balance while at sink. Max cues to intiate and sequence ADLs due to decreased memory, problem solving, and awareness.             Lower Body Dressing: Min guard;Sit to/from stand;Minimal assistance Lower Body Dressing Details (indicate cue type and reason): Close Min Guard A for safety while pt donned socks. Pt using figure four technique at EOB. Min A for balance and safety in safety             Functional mobility during ADLs: Minimal assistance;Moderate assistance (one LOB requiring Mod A for correction and fall prevention) General ADL Comments: Pt continues to present with decreased balance, cognition, safety, and activity tolerance     Vision  Perception     Praxis      Cognition Arousal/Alertness: Awake/alert Behavior During Therapy: Impulsive Overall Cognitive Status: Impaired/Different from baseline Area of Impairment: Problem solving;Awareness;Following  commands;Safety/judgement;Memory;Attention;Orientation                 Orientation Level: Disoriented to;Time;Situation;Place (able to state she is in [place]) Current Attention Level: Sustained (Once task initated, pt able to sustain) Memory: Decreased short-term memory Following Commands: Follows one step commands inconsistently;Follows one step commands with increased time Safety/Judgement: Decreased awareness of deficits;Decreased awareness of safety Awareness: Intellectual Problem Solving: Slow processing;Decreased initiation;Difficulty sequencing;Requires verbal cues;Requires tactile cues General Comments: Pt with decreased initation of tasks and poor memory to recall commands. Pt requiring Max cues to recall why she was at the sink (to brush her teeth) and then to place tooth paste on tooth brush. Very distracted by objects on sink and perseverating on turning water on and off. Pt unable to recall cues after ~30 seconds. Pt throughout session asking to go home        Exercises     Shoulder Instructions       General Comments VSS on RA    Pertinent Vitals/ Pain       Pain Assessment: No/denies pain  Home Living                                          Prior Functioning/Environment              Frequency  Min 2X/week        Progress Toward Goals  OT Goals(current goals can now be found in the care plan section)  Progress towards OT goals: Progressing toward goals  Acute Rehab OT Goals Patient Stated Goal: none stated OT Goal Formulation: Patient unable to participate in goal setting Time For Goal Achievement: 01/16/21 Potential to Achieve Goals: Fair ADL Goals Pt Will Perform Grooming: with supervision;standing Pt Will Perform Upper Body Bathing: with min assist;sitting;standing Pt Will Transfer to Toilet: with min assist;ambulating Pt/caregiver will Perform Home Exercise Program: Increased strength;Both right and left upper  extremity;With Supervision;With written HEP provided Additional ADL Goal #1: Pt will follow 2 step commands with 50% accuracy to maximize ADL participation Additional ADL Goal #2: Pt will complete bed mobility with Supervision in prep for ADLs  Plan Discharge plan remains appropriate    Co-evaluation                 AM-PAC OT "6 Clicks" Daily Activity     Outcome Measure   Help from another person eating meals?: A Little Help from another person taking care of personal grooming?: A Lot Help from another person toileting, which includes using toliet, bedpan, or urinal?: A Lot Help from another person bathing (including washing, rinsing, drying)?: A Lot Help from another person to put on and taking off regular upper body clothing?: A Lot Help from another person to put on and taking off regular lower body clothing?: A Lot 6 Click Score: 13    End of Session Equipment Utilized During Treatment: Gait belt  OT Visit Diagnosis: Other abnormalities of gait and mobility (R26.89);Muscle weakness (generalized) (M62.81);Other symptoms and signs involving the nervous system (R29.898);Other symptoms and signs involving cognitive function   Activity Tolerance Patient tolerated treatment well   Patient Left in chair;with call bell/phone within reach;with chair alarm set (belt alarm)   Nurse  Communication Mobility status;Precautions        Time: 6190-1222 OT Time Calculation (min): 28 min  Charges: OT General Charges $OT Visit: 1 Visit OT Treatments $Self Care/Home Management : 23-37 mins  [place], OTR/L Acute Rehab Pager: [phone] Office: [place] 01/02/2021, 10:53 AM

## 2021-01-02 NOTE — Progress Notes (Signed)
Inpatient Rehabilitation Medication Review by a Pharmacist  A complete drug regimen review was completed for this patient to identify any potential clinically significant medication issues.  High Risk Drug Classes Is patient taking? Indication by Medication  Antipsychotic Yes Risperidone for aggressive behavior, mood stabilization; Prochlorperazine PRN for nausea/vomiting  Anticoagulant Yes Enoxaparin for VTE prophylaxis  Antibiotic Yes Cephalexin for cellulitis  Opioid No   Antiplatelet No   Hypoglycemics/insulin Yes Sliding scale aspart insulin, insulin glargine (Semglee) for diabetes  Vasoactive Medication Yes Losartan, carvedilol for hypertension  Chemotherapy No   Other No      Type of Medication Issue Identified Description of Issue Recommendation(s)  Drug Interaction(s) (clinically significant)     Duplicate Therapy     Allergy     No Medication Administration End Date  Per [facility] discharge summary, pt is to receive total of 14 days of antibiotic therapy for cellulitis; per Epic MAR, pt has rec'd 12 days of therapy, so needs 2 more days of cephalexin; current cephalexin order has no stop date entered Enter stop date for cephalexin after 2 more days of treatment (will notify provider via secure chat)  Incorrect Dose     Additional Drug Therapy Needed     Significant med changes from prior encounter (inform family/care partners about these prior to discharge). Per [facility] discharge med list, pt no longer taking the following: Folic acid, Norco, meloxicam, phentermine, tizanidine, ergocalciferol Educate pt, caregivers that pt no longer taking these medications  Other       Clinically significant medication issues were identified that warrant physician communication and completion of prescribed/recommended actions by midnight of the next day:  Yes  Name of provider notified for urgent issues identified:  Reesa Chew, PA  Provider Method of Notification:  Secure  chat  Time spent performing this drug regimen review (minutes):  [place], PharmD, BCPS, [facility] Clinical Pharmacist 01/02/2021 2:24 PM

## 2021-01-02 NOTE — H&P (Signed)
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       HPI: Courtney Davies is a 45 year old female with history of T2DM, Vitamin B 12 deficiency, OA left knee, chronic LBP, GERD/dysphagia s/p esophageal dilatation, Covid + three weeks PTA on 12/01/20 with VF cardiac arrest, fall 4 feet and ROSC after 10 of minutes of ACLS. UDS negative.  She was intubated for airway protection, had elevated troponins and CT chest negative for PE but showed bilateral lower lobe consolidations. She was still covid + and started on IV antibiotics due to concerns of aspiration PNA and IV heparin. 2D echo showed EF 40-45% with G1DD and mild to moderate MR. She was maintained on IV amiodarone with episodes of PSVT without VT and possible PAF.  Dr. Irish Lack consulted and recommended cardiac cath for work up pending neurological recovery and no need for long term anticoagulation. .      EEG done showing some slowing due to moderate diffuse encephalopathy . She required reintubation 08/19 due to minimal responsiveness, developed fevers and had difficulty handling secretions. Dr. Leonel Ramsay consulted for input on 08/21 and MRI brain done revealing areas of reduced diffusion along bilateral hippocampi as well as bilateral cerebral cortices c/w anoxic BI and prolonged recovery predicted by neurology.  MRI C-spine without soft tissue or ligamentous injury and mild degenerative changes. She tolerated extubation by 08/23 but kept NPO due to cognitive based dysphagia with resistance to trials of orals. She has had bouts of agitation and palliative care consulted to discuss goals of care. Family elected on full scope of care and PEG placed on 08/30.    She started developing leucocytosis with fevers and started on ceftriaxone. Dr. West Bali /ID consulted for input. CT abdomen/pelvis showed stranding/edema of SQ abdominal wall and was negative  for abscess or fluid collection.  Patient felt to have cellulitis and IV ceftriaxone transitioned to Keflex for 14 total days of antibiotic coverage.  She started developing foul, thick drainage from PEG site on 09/09 and family requested PEG removal as intake was improving. Psychiatry consulted to help manage aggressive behavior/mood stabilization. She was started on Depakote with haldol/ativan IV as well as restraints prn. Patient pulled out her PEG and local wound care ongoing. Also noted to have right thyroid nodules on ultrasound. She continues to be limited by cognitive deficits, balance deficits, poor safety awareness with internal/external distraction, difficulty with one step commands as well as problems with mobility. CIR recommended due to functional decline.      Review of Systems  Constitutional:  Negative for chills and fever.  HENT:  Negative for hearing loss.   Eyes:  Negative for blurred vision.  Respiratory:  Negative for shortness of breath and stridor.   Cardiovascular:  Negative for chest pain.  Gastrointestinal:  Negative for diarrhea and heartburn.  Genitourinary:  Negative for dysuria.  Musculoskeletal:  Negative for joint pain and myalgias.  Skin:  Negative for rash.  Neurological:  Positive for weakness. Negative for dizziness and headaches.  Psychiatric/Behavioral:  Positive for memory loss.           Past Medical History:  Diagnosis Date   Anemia     Chronic low back pain with sciatica     Diabetes type 2, controlled (HCC)     Elevated ferritin level     GERD with stricture     Hypertension  OA (osteoarthritis) of knee--left     Vitamin B 12 deficiency             Past Surgical History:  Procedure Laterality Date   ESOPHAGEAL DILATION   02/2020    by Dr Shana Chute   IR GASTROSTOMY TUBE MOD SED   12/13/2020   KNEE CARTILAGE SURGERY Left             Family History  Problem Relation Age of Onset   Stroke Mother     Diabetes Mother     Diabetes  Father     Stroke Maternal Grandmother     Diabetes Maternal Grandmother          Social History:  Single--lives with two sons in [place]. 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 She smokes cigarettes-- 1/2 PPD and marijuana daily. Uses alcohol occasionally.           Allergies  Allergen Reactions   Other Anaphylaxis and Hives      Hair Glue   Codeine Nausea And Vomiting   Latex Hives   Nitrofurantoin Rash            Medications Prior to Admission  Medication Sig Dispense Refill   folic acid (FOLVITE) 1 MG tablet Take 1 mg by mouth daily.       HYDROcodone-acetaminophen (NORCO) 7.5-325 MG tablet Take 1 tablet by mouth 3 (three) times daily as needed for pain.       LINZESS 290 MCG CAPS capsule Take 290 mcg by mouth daily.       meloxicam (MOBIC) 15 MG tablet Take 15 mg by mouth daily.       nirmatrelvir & ritonavir (PAXLOVID) 20 x 150 MG & 10 x 100MG TBPK Take 100-150 mg by mouth See admin instructions. follow package directions       pantoprazole (PROTONIX) 40 MG tablet Take 40 mg by mouth 2 (two) times daily.       phentermine (ADIPEX-P) 37.5 MG tablet Take 37.5 mg by mouth daily.       PROAIR HFA 108 (90 Base) MCG/ACT inhaler Inhale 1-2 puffs into the lungs every 4 (four) hours as needed.       tiZANidine (ZANAFLEX) 4 MG tablet Take 4 mg by mouth at bedtime.       TRULICITY 4.5 HD/6.2IW SOPN Inject 4.5 mg into the skin once a week.       Vitamin D, Ergocalciferol, (DRISDOL) 1.25 MG (50000 UNIT) CAPS capsule Take 50,000 Units by mouth once a week.          Drug Regimen Review  Drug regimen was reviewed and remains appropriate with no significant issues identified   Home: Home Living Family/patient expects to be discharged to:: Unsure Living Arrangements: Children   Functional History: Prior Function Level of Independence: Independent Comments: anticipate independent, per chart working, pt unable and no family present    Functional Status:  Mobility: Bed Mobility Overal bed mobility: Needs Assistance Bed Mobility: Supine to Sit Rolling: Min assist, +2 for physical assistance Sidelying to sit: Supervision, HOB elevated Supine to sit: Min guard Sit to supine: Mod assist, +2 for physical assistance Sit to sidelying: Min assist General bed mobility comments: Close Min Guard A for safety from supine to sitting. Use of bedrails to pull into upright posture Transfers Overall transfer level: Needs assistance Equipment used: None Transfer via Lift Equipment: Maximove Transfers: Sit to/from Stand Sit to Stand: Min assist Stand pivot transfers:  Min assist, +2 safety/equipment General transfer comment: Min A for gaining balance in once in standing Ambulation/Gait Ambulation/Gait assistance: Mod assist Gait Distance (Feet): 6 Feet Assistive device: None Gait Pattern/deviations: Step-to pattern, Decreased step length - right, Decreased step length - left General Gait Details: patient ambulated along edge of bed, would not use walker. Min assist for balance. Gait velocity: reduced Gait velocity interpretation: <1.8 ft/sec, indicate of risk for recurrent falls   ADL: ADL Overall ADL's : Needs assistance/impaired Eating/Feeding: NPO Grooming: Oral care, Wash/dry face, Minimal assistance, Standing, Cueing for sequencing Grooming Details (indicate cue type and reason): Min A for standing balance while at sink. Max cues to intiate and sequence ADLs due to decreased memory, problem solving, and awareness. Lower Body Bathing: Set up, Sitting/lateral leans Lower Body Bathing Details (indicate cue type and reason): drainage from abdominal wound had started to run down pts. leg. she was able to note it and wash and dry LLE without assistance Lower Body Dressing: Min guard, Sit to/from stand, Minimal assistance Lower Body Dressing Details (indicate cue type and reason): Close Min Guard A for safety while pt donned  socks. Pt using figure four technique at EOB. Min A for balance and safety in safety Toilet Transfer: Minimal assistance, +2 for safety/equipment, Cueing for sequencing, Cueing for safety Toilet Transfer Details (indicate cue type and reason): able to stand from eob and take approx. 4 steps to recliner following one step commands with increased time and inconsistently Toileting- Clothing Manipulation and Hygiene: Total assistance, +2 for physical assistance, +2 for safety/equipment, Bed level Functional mobility during ADLs: Minimal assistance, Moderate assistance (one LOB requiring Mod A for correction and fall prevention) General ADL Comments: Pt continues to present with decreased balance, cognition, safety, and activity tolerance   Cognition: Cognition Overall Cognitive Status: Impaired/Different from baseline Arousal/Alertness: Awake/alert Orientation Level: Oriented to person, Oriented to situation, Disoriented to time, Disoriented to place Awareness: Impaired Awareness Impairment: Intellectual impairment, Emergent impairment, Anticipatory impairment Problem Solving: Impaired Problem Solving Impairment: Verbal basic, Functional basic Behaviors: Restless, Verbal agitation, Physical agitation, Poor frustration tolerance, Lability Safety/Judgment: Impaired Cognition Arousal/Alertness: Awake/alert Behavior During Therapy: Impulsive Overall Cognitive Status: Impaired/Different from baseline Area of Impairment: Problem solving, Awareness, Following commands, Safety/judgement, Memory, Attention, Orientation Orientation Level: Disoriented to, Time, Situation, Place (able to state she is in [place]) Current Attention Level: Sustained (Once task initated, pt able to sustain) Memory: Decreased short-term memory Following Commands: Follows one step commands inconsistently, Follows one step commands with increased time Safety/Judgement: Decreased awareness of deficits, Decreased awareness of  safety Awareness: Intellectual Problem Solving: Slow processing, Decreased initiation, Difficulty sequencing, Requires verbal cues, Requires tactile cues General Comments: Pt with decreased initation of tasks and poor memory to recall commands. Pt requiring Max cues to recall why she was at the sink (to brush her teeth) and then to place tooth paste on tooth brush. Very distracted by objects on sink and perseverating on turning water on and off. Pt unable to recall cues after ~30 seconds. Pt throughout session asking to go home Difficult to assess due to: Impaired communication     Blood pressure 101/76, pulse (!) 108, temperature 98.6 F (37 C), temperature source Oral, resp. rate 17, height 5' 6"  (1.676 m), weight 94.1 kg, SpO2 100 %. Physical Exam Vitals and nursing note reviewed.  Constitutional:      Appearance: Normal appearance.  Abdominal:     Comments: Prior PEG site healing well --no dressing but serosanguinous drainage noted on sheets.  Skin:    General: Skin is warm and dry.  Neurological:     Mental Status: She is alert.     Comments: Oriented to self --needed cues for place, city, age, date. She was able to follow simple motor commands.   Could not recall cardiac arrest, did not remember her place of employment, remembers son's name but not their DOB or her own DOB General: No acute distress Mood and affect are appropriate Heart: Regular rate and rhythm no rubs murmurs or extra sounds Lungs: Clear to auscultation, breathing unlabored, no rales or wheezes Abdomen: Positive bowel sounds, soft nontender to palpation, nondistended Extremities: No clubbing, cyanosis, or edema Skin: No evidence of breakdown, no evidence of rash Neurologic: Cranial nerves II through XII intact, motor strength is 5/5 in bilateral deltoid, bicep, tricep, grip, hip flexor, knee extensors, ankle dorsiflexor and plantar flexor Sensory exam normal sensation to light touch and proprioception in  bilateral upper and lower extremities Cerebellar exam normal finger to nose to finger as well as heel to shin in bilateral upper and lower extremities Musculoskeletal: Full range of motion in all 4 extremities. No joint swelling , no tenderness to palpation over the chest    Lab Results Last 48 Hours        Results for orders placed or performed during the hospital encounter of 12/01/20 (from the past 48 hour(s))  Glucose, capillary     Status: Abnormal    Collection Time: 12/31/20  3:22 PM  Result Value Ref Range    Glucose-Capillary 149 (H) 70 - 99 mg/dL      Comment: Glucose reference range applies only to samples taken after fasting for at least 8 hours.  Glucose, capillary     Status: Abnormal    Collection Time: 12/31/20  8:31 PM  Result Value Ref Range    Glucose-Capillary 146 (H) 70 - 99 mg/dL      Comment: Glucose reference range applies only to samples taken after fasting for at least 8 hours.  Glucose, capillary     Status: Abnormal    Collection Time: 01/01/21 12:07 AM  Result Value Ref Range    Glucose-Capillary 157 (H) 70 - 99 mg/dL      Comment: Glucose reference range applies only to samples taken after fasting for at least 8 hours.  Glucose, capillary     Status: Abnormal    Collection Time: 01/01/21  4:34 AM  Result Value Ref Range    Glucose-Capillary 163 (H) 70 - 99 mg/dL      Comment: Glucose reference range applies only to samples taken after fasting for at least 8 hours.  Glucose, capillary     Status: Abnormal    Collection Time: 01/01/21  7:51 AM  Result Value Ref Range    Glucose-Capillary 254 (H) 70 - 99 mg/dL      Comment: Glucose reference range applies only to samples taken after fasting for at least 8 hours.  Glucose, capillary     Status: Abnormal    Collection Time: 01/01/21 11:56 AM  Result Value Ref Range    Glucose-Capillary 174 (H) 70 - 99 mg/dL      Comment: Glucose reference range applies only to samples taken after fasting for at least 8  hours.  Glucose, capillary     Status: Abnormal    Collection Time: 01/01/21  4:31 PM  Result Value Ref Range    Glucose-Capillary 150 (H) 70 - 99 mg/dL      Comment: Glucose  reference range applies only to samples taken after fasting for at least 8 hours.  Glucose, capillary     Status: Abnormal    Collection Time: 01/01/21  9:12 PM  Result Value Ref Range    Glucose-Capillary 137 (H) 70 - 99 mg/dL      Comment: Glucose reference range applies only to samples taken after fasting for at least 8 hours.  Glucose, capillary     Status: Abnormal    Collection Time: 01/02/21  1:27 AM  Result Value Ref Range    Glucose-Capillary 141 (H) 70 - 99 mg/dL      Comment: Glucose reference range applies only to samples taken after fasting for at least 8 hours.  Glucose, capillary     Status: Abnormal    Collection Time: 01/02/21  4:21 AM  Result Value Ref Range    Glucose-Capillary 161 (H) 70 - 99 mg/dL      Comment: Glucose reference range applies only to samples taken after fasting for at least 8 hours.  Glucose, capillary     Status: Abnormal    Collection Time: 01/02/21  8:04 AM  Result Value Ref Range    Glucose-Capillary 227 (H) 70 - 99 mg/dL      Comment: Glucose reference range applies only to samples taken after fasting for at least 8 hours.  Glucose, capillary     Status: Abnormal    Collection Time: 01/02/21 11:53 AM  Result Value Ref Range    Glucose-Capillary 184 (H) 70 - 99 mg/dL      Comment: Glucose reference range applies only to samples taken after fasting for at least 8 hours.      Imaging Results (Last 48 hours)  No results found.           Medical Problem List and Plan: 1.  Decline in ADL, mobility , and cognition secondary to anoxic brain injury             -patient may  shower             -ELOS/Goals: 18-21d 2.  Antithrombotics: -DVT/anticoagulation:  Pharmaceutical: Lovenox             -antiplatelet therapy: N/A 3. Pain Management: Tylenol prn.  4. Mood: LCSW  to follow for evaluation and support.              -antipsychotic agents: Has taking Depakote once a day on average due to refusal of meds             --will increase Risperdal to bid as requiring Haldol/ativan at nights.   5. Neuropsych: This patient is not capable of making decisions on her own behalf. 6. Skin/Wound Care:  Monitor abdominal wound for healing.             --routine pressure relief measures. f 7. Fluids/Electrolytes/Nutrition: Monitor I/O. Check lytes in am. 8. Cellulitis abdomen: On Keflex --antibiotic Day# 6/14. Continue to monitor for healing.  9. T2DM: Hgb A1c-6.9. Continue insulin glargine ---transition to trulicity at discharge?             --monitor BS ac/hs and use SSI for elevated BS.  10. HTN: Monitor BP tid--continue Cozaar 11. SVT/NSVT: On coreg and amiodarone bid for rate control.   12. Agitation/Mood stabilization: On Depakote TID--encourage compliance as refusing multiple meds.     Bary Leriche, PA-C 01/02/2021   "I have personally performed a face to face diagnostic evaluation of this patient.  Additionally, I have reviewed  and concur with the physician assistant's documentation above." Charlett Blake M.D. [facility] Fellow Am [employer] of [employer] and Rehab Diplomate Am Board of Electrodiagnostic Med Fellow Am Board of Interventional Pain

## 2021-01-03 DIAGNOSIS — L03311 Cellulitis of abdominal wall: Secondary | ICD-10-CM | POA: Diagnosis not present

## 2021-01-03 DIAGNOSIS — I10 Essential (primary) hypertension: Secondary | ICD-10-CM

## 2021-01-03 DIAGNOSIS — G931 Anoxic brain damage, not elsewhere classified: Secondary | ICD-10-CM | POA: Diagnosis not present

## 2021-01-03 DIAGNOSIS — E669 Obesity, unspecified: Secondary | ICD-10-CM

## 2021-01-03 DIAGNOSIS — E1169 Type 2 diabetes mellitus with other specified complication: Secondary | ICD-10-CM | POA: Diagnosis not present

## 2021-01-03 LAB — GLUCOSE, CAPILLARY
Glucose-Capillary: 151 mg/dL — ABNORMAL HIGH (ref 70–99)
Glucose-Capillary: 147 mg/dL — ABNORMAL HIGH (ref 70–99)
Glucose-Capillary: 188 mg/dL — ABNORMAL HIGH (ref 70–99)
Glucose-Capillary: 161 mg/dL — ABNORMAL HIGH (ref 70–99)
Glucose-Capillary: 162 mg/dL — ABNORMAL HIGH (ref 70–99)

## 2021-01-03 LAB — CBC WITH DIFFERENTIAL/PLATELET
Abs Immature Granulocytes: 0.02 10*3/uL (ref 0.00–0.07)
Basophils Absolute: 0.1 10*3/uL (ref 0.0–0.1)
Basophils Relative: 1 %
Eosinophils Absolute: 0.4 10*3/uL (ref 0.0–0.5)
Eosinophils Relative: 7 %
HCT: 32 % — ABNORMAL LOW (ref 36.0–46.0)
Hemoglobin: 9.9 g/dL — ABNORMAL LOW (ref 12.0–15.0)
Immature Granulocytes: 0 %
Lymphocytes Relative: 43 %
Lymphs Abs: 2.6 10*3/uL (ref 0.7–4.0)
MCH: 22.2 pg — ABNORMAL LOW (ref 26.0–34.0)
MCHC: 30.9 g/dL (ref 30.0–36.0)
MCV: 71.9 fL — ABNORMAL LOW (ref 80.0–100.0)
Monocytes Absolute: 0.6 10*3/uL (ref 0.1–1.0)
Monocytes Relative: 10 %
Neutro Abs: 2.3 10*3/uL (ref 1.7–7.7)
Neutrophils Relative %: 39 %
Platelets: 287 10*3/uL (ref 150–400)
RBC: 4.45 MIL/uL (ref 3.87–5.11)
RDW: 15.4 % (ref 11.5–15.5)
WBC: 6 10*3/uL (ref 4.0–10.5)
nRBC: 0 % (ref 0.0–0.2)

## 2021-01-03 LAB — COMPREHENSIVE METABOLIC PANEL
ALT: 19 U/L (ref 0–44)
AST: 22 U/L (ref 15–41)
Albumin: 2.6 g/dL — ABNORMAL LOW (ref 3.5–5.0)
Alkaline Phosphatase: 77 U/L (ref 38–126)
Anion gap: 9 (ref 5–15)
BUN: 12 mg/dL (ref 6–20)
CO2: 25 mmol/L (ref 22–32)
Calcium: 8.9 mg/dL (ref 8.9–10.3)
Chloride: 101 mmol/L (ref 98–111)
Creatinine, Ser: 0.77 mg/dL (ref 0.44–1.00)
GFR, Estimated: >60 mL/min (ref >60)
Glucose, Bld: 157 mg/dL — ABNORMAL HIGH (ref 70–99)
Potassium: 3.8 mmol/L (ref 3.5–5.1)
Sodium: 135 mmol/L (ref 135–145)
Total Bilirubin: 0.2 mg/dL — ABNORMAL LOW (ref 0.3–1.2)
Total Protein: 6.5 g/dL (ref 6.5–8.1)

## 2021-01-03 NOTE — Progress Notes (Addendum)
Patient ID: Courtney Davies, female   DOB: 17-Feb-1976, 45 y.o.   MRN: [id number]  SW met with pt and pt father [name]  in room to provide updates from team conference, and ELOS 2-3 weeks. SW informed there will be more updates once avaialble. Pt father is aware SW will f/u with her sister [name].  [place] spoke with pt sister [name] [phone]) to introduce self, explain role, and discuss discharge process. Confirms that pt will d/c to home with her mother who will provide 24/7 care. SW informed on ELOS, and will provide more updates after team conference next week.  *Pt is not eligible for STD through employer since benefits not offered due to only working part-time.   [name], MSW, [place] Office: [phone] Cell: [phone] Fax: [phone]

## 2021-01-03 NOTE — Progress Notes (Signed)
Pt tearful this am verbalizing that she wishes to die and states she want to commit suicide. Patient has no active plan. Just states that she has so much going on and no money and wants to give up.

## 2021-01-03 NOTE — Progress Notes (Signed)
Occupational Therapy Session Note  Patient Details  Name: Courtney Davies MRN: [id number] Date of Birth: 15-Feb-1976  Today's Date: 01/03/2021 OT Individual Time: 1430-1500 OT Individual Time Calculation (min): 30 min   Short Term Goals: Week 1:  OT Short Term Goal 1 (Week 1): Pt will be A&Ox4 with external cues PRN OT Short Term Goal 2 (Week 1): Pt will participate in ADLs 3 days in a row without agitation OT Short Term Goal 3 (Week 1): Pt will complete bed mobility with close (S) OT Short Term Goal 4 (Week 1): Pt will bathe UB with min cuing for attention to task  Skilled Therapeutic Interventions/Progress Updates:  Pt greeted semi-reclined in bed with father present. Pt much more agreeable with no signs of agitation this afternoon. Pt agreeable to get up and go to the bathroom. Pt needed much more than reasonable amount of time and max multimodal cues to initiate bed mobility. Pt continuously scooting towards EOB instead of standing with very poor body and safety awareness. Pt eventually able to get into standing with mod A. She then needed mod A to ambulate into bathroom, OT assist for clothing management prior to sitting. Pt noted to be incontinent of bowel and bladder and unaware. Pt able to have more continent void though once seated on commode. Pt able to initiate washing peri-area but needed OT assist for posterior peri-care. Pt needed min/mod A for standing balance during toileting task. Mod A to ambulate back to bed with max multimodal cues to scoot up in bed prior to laying down. Pt left semi-reclined in bed with alarm belt on, call bell in reach, and needs met.   Balance/vestibular training;Cognitive remediation/compensation;Community reintegration;Discharge planning;Disease mangement/prevention;DME/adaptive equipment instruction;Functional mobility training;Pain management;Patient/family education;Psychosocial support;Self Care/advanced ADL retraining;Skin care/wound  managment;Therapeutic Activities;Therapeutic Exercise;UE/LE Strength taining/ROM;UE/LE Coordination activities;Visual/perceptual remediation/compensation   Therapy Documentation Precautions:  Precautions Precautions: Fall Precaution Comments: incontinence, abdominal wound (from PEG tube) Restrictions Weight Bearing Restrictions: No Other Position/Activity Restrictions: posey waist restraint, all bed rails Pain Denies pain   Therapy/Group: Individual Therapy  Valma Cava 01/03/2021, 3:13 PM

## 2021-01-03 NOTE — Patient Care Conference (Signed)
Inpatient RehabilitationTeam Conference and Plan of Care Update Date: 01/03/2021   Time: 10:43 AM    Patient Name: Courtney Davies      Medical Record Number: [id number]  Date of Birth: 15-Aug-1975 Sex: Female         Room/Bed: 4W15C/4W15C-01 Payor Info: Payor: [place] / Plan: [place] MEDICAID Council / Product Type: *No Product type* /    Admit Date/Time:  01/02/2021  1:44 PM  Primary Diagnosis:  <principal problem not specified>  Hospital Problems: Active Problems:   Anoxic brain injury [facility])    Expected Discharge Date: Expected Discharge Date:  (3 weeks)  Team Members Present: Physician leading conference: Dr. Alger Simons Social Worker Present: [name], [place] Nurse Present: Dorthula Nettles, RN PT Present: Tereasa Coop, PT OT Present: Cherylynn Ridges, OT SLP Present: Weston Anna, SLP PPS Coordinator present : Gunnar Fusi, SLP     Current Status/Progress Goal Weekly Team Focus  Bowel/Bladder   urgency, incontinent b/b, lbm 9/15  regain continence  time toileting q 2hr   Swallow/Nutrition/ Hydration             ADL's   Eval pending         Mobility   Eval pending         Communication             Safety/Cognition/ Behavioral Observations            Pain   reports some pain, Tylenol available  < 3  assess pain q 4hr and prn   Skin   Cellulitis old peg site  no new breakdown  assess q shift and prn     Discharge Planning:  Pt to be assessed. Per EMR, pt to d/c to her mother's home. SW will confirm there are no barriers to discharge.   Team Discussion: Anoxic BI, short term memory issues, cellulitis of the abdomen from pulling out of peg tube. Incontinent B/B, with urgency, LBM 9/15. No pain reported but receiving scheduled Tylenol. Treating cellulitis at peg site. Nursing to crush medications to avoid patient spitting out. Current barriers are behavior and forgetfulness. OT and PT evals pending. Full supervision  with meals to encourage PO intake. Poor short term memory, word finding, and recall.  Patient on target to meet rehab goals: PT and OT evals pending.  *See Care Plan and progress notes for long and short-term goals.   Revisions to Treatment Plan:  Please have nursing crush all medications and put in applesauce to avoid patient spitting out medications.  Teaching Needs: Family education, medication management, pain management, skin/wound care, cognition awareness, behavior management, transfer training, gait training, balance training, endurance training, safety awareness.  Current Barriers to Discharge: Decreased caregiver support, Medical stability, Home enviroment access/layout, Incontinence, Wound care, Lack of/limited family support, Weight, Medication compliance, and Behavior  Possible Resolutions to Barriers: Continue current medications for pain management, continue cognition management, continue behavior management, educate on skin/wound care with family.     Medical Summary Current Status: ABI after VF cardiac arrest. ongoing cognitive deficits. HR elevated at times. bp still elevated  Barriers to Discharge: Medical stability;Behavior   Possible Resolutions to [employer]: daily assessment of cv parameters, labs, and pt data, behavioral mgt   Continued Need for Acute Rehabilitation Level of Care: The patient requires daily medical management by a physician with specialized training in physical medicine and rehabilitation for the following reasons: Direction of a multidisciplinary physical rehabilitation program to maximize functional  independence : Yes Medical management of patient stability for increased activity during participation in an intensive rehabilitation regime.: Yes Analysis of laboratory values and/or radiology reports with any subsequent need for medication adjustment and/or medical intervention. : Yes   I attest that I was present, lead the team  conference, and concur with the assessment and plan of the team.   Cristi Loron 01/03/2021, 3:15 PM

## 2021-01-03 NOTE — Progress Notes (Signed)
Patient easily agitated, with disorganized thoughts. Voice concerns about working to pay bills etc. Also upset that she cant smoke, tried to educate patient in reguards to the smoking policy and offer to talk to the doctor about nicotine patches this only made patient further upset. Patient at times were very pleasant, then became tearful and voice she wants to give up. Ativan was given per order.

## 2021-01-03 NOTE — Evaluation (Signed)
Occupational Therapy Assessment and Plan  Patient Details  Name: Courtney Davies MRN: [id number] Date of Birth: 1975/11/27  OT Diagnosis: abnormal posture, altered mental status, cognitive deficits, lumbago (low back pain), and muscle weakness (generalized) Rehab Potential: Rehab Potential (ACUTE ONLY): Fair ELOS:   3 weeks   Today's Date: 01/03/2021 OT Individual Time: 1100-1145 OT Individual Time Calculation (min): 45 min     Hospital Problem: Active Problems:   Anoxic brain injury [facility])   Past Medical History:  Past Medical History:  Diagnosis Date   Anemia 09/2015   Anemia    Arthritis    "knees" (04/23/2017)   Asthma    "teens; went away; came back" (04/23/2017)   B12 deficiency anemia 04/23/2017   Chronic bronchitis (HCC)    Chronic low back pain with sciatica    Chronic lower back pain    Diabetes mellitus without complication ([place])    Diabetes type 2, controlled ([place])    Elevated ferritin level    Fatty liver    GERD (gastroesophageal reflux disease)    GERD with stricture    Headache    "1-2/wk" (04/23/2017)   History of blood transfusion "plenty"   "related to anemia" (04/23/2017)   Hypertension    Inguinal hernia    Low back pain    Migraine    "1-2/month" (04/23/2017)   OA (osteoarthritis) of knee--left    Symptomatic anemia 04/23/2017   Vitamin B 12 deficiency    Past Surgical History:  Past Surgical History:  Procedure Laterality Date   ABDOMINAL HYSTERECTOMY  YRS AGO   [place]; 2002   ESOPHAGEAL DILATION  02/2020   by Dr Shana Chute   INGUINAL HERNIA REPAIR Bilateral 1980s    "total of 4 surgeries" (04/23/2017)   IR GASTROSTOMY TUBE MOD SED  12/13/2020   KNEE ARTHROSCOPY WITH MEDIAL MENISECTOMY Left 01/30/2018   Procedure: LEFT KNEE ARTHROSCOPY WITH PARTIAL LATERAL MENISCECTOMY;  Surgeon: Mcarthur Rossetti, MD;  Location: [facility];  Service: Orthopedics;  Laterality: Left;   KNEE CARTILAGE SURGERY Left    TUBAL LIGATION  2002     Assessment & Plan Clinical Impression:   Pt is a 45 y/o female with PMH significant for HTN, B12 deficiency, covid 4 wks PTA, and OSA, who was admitted to [facility] on 12/01/20 for witnessed v-fib arrest at work.  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  CPR was initiated and she received 5 rounds of epi, 5 episodes of defib, and 400 mg of amio.  Epi drip was started with achievement of ROSC.  On arrival to ED she was intubated, EKG showed ST, HS troponin 65, K3.7, sodium 136.  She tested covid positive.  CT head/spine showed no acute abnormalities, CTA chest showed no PE but did show bilateral lower lobe consolidations.  Cardiology was consulted and recommended IV abx, IV heparin, ECHO, but no further inpatient cardiac workup (including cath) until improvement in mental status.  Neurology was consulted due to inability to protect airway due to mental status and ongoing encephalopathy.  MRI showed ABI.  Palliative was consulted for goals of care and family wishes to proceed with aggressive intervention at this time.  ID was consulted due to ongoing leukocytosis and fevers, and workup was essentially negative.  Pt did have a PEG placed on 8/30, but she pulled it out on 9/11 and it remains out.  Therapy evaluations were completed  and pt was recommended for CIR due to need for aggressive multidisciplinary therapy. Patient admitted 01/02/21.     Patient currently requires mod with basic self-care skills and IADL secondary to muscle weakness, decreased cardiorespiratoy endurance, impaired timing and sequencing, decreased coordination, and decreased motor planning, decreased visual motor skills, decreased midline orientation and decreased motor planning, and decreased initiation, decreased attention, decreased awareness, decreased problem solving, decreased safety awareness, decreased memory, and delayed processing.  Prior to hospitalization, patient could  complete BADLs with independent .  Patient will benefit from skilled intervention to decrease level of assist with basic self-care skills, increase independence with basic self-care skills, and increase level of independence with iADL prior to discharge home with care partner.  Anticipate patient will require 24 hour supervision and follow up home health and follow up outpatient.  OT Assessment Rehab Potential (ACUTE ONLY): Fair OT Barriers to Discharge: Home environment access/layout;Incontinence;Behavior;Wound Care OT Patient demonstrates impairments in the following area(s): Balance;Behavior;Cognition;Endurance;Motor;Nutrition;Pain;Perception;Safety;Sensory;Vision OT Basic ADL's Functional Problem(s): Eating;Grooming;Bathing;Dressing;Toileting OT Advanced ADL's Functional Problem(s): Simple Meal Preparation;Full Meal Preparation;Laundry;Light Housekeeping OT Transfers Functional Problem(s): Tub/Shower;Toilet OT Plan OT Intensity: Minimum of 1-2 x/day, 45 to 90 minutes OT Frequency: 5 out of 7 days OT Treatment/Interventions: Balance/vestibular training;Cognitive remediation/compensation;Community reintegration;Discharge planning;Disease mangement/prevention;DME/adaptive equipment instruction;Functional mobility training;Pain management;Patient/family education;Psychosocial support;Self Care/advanced ADL retraining;Skin care/wound managment;Therapeutic Activities;Therapeutic Exercise;UE/LE Strength taining/ROM;UE/LE Coordination activities;Visual/perceptual remediation/compensation OT Recommendation Follow Up Recommendations: 24 hour supervision/assistance   OT Evaluation Precautions/Restrictions  Precautions Precautions: Fall Precaution Comments: incontinence, abdominal wound (from PEG tube) Restrictions Weight Bearing Restrictions: No Other Position/Activity Restrictions: posey waist restraint, all bed rails    Home Living/Prior Functioning Home Living Family/patient expects to be  discharged to:: Private residence Living Arrangements: Children, Parent, Other relatives Available Help at Discharge: Family, Available 24 hours/day Type of Home: House Home Access: Stairs to enter [employer] of Steps: 5 Entrance Stairs-Rails: Right, Left Home Layout: One level Bathroom Shower/Tub:  (need to confirm with pt's mother) Additional Comments:  (pt to live with mother upon d/c...need to confirm)  Lives With: Alone IADL History Current License: Yes Occupation: Full time employment 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 no family present to confirm) Comments: anticipate independent, per chart working, pt unable and no family present Vision Baseline Vision/History: 0 No visual deficits Ability to See in Adequate Light: 0 Adequate Vision Assessment?: Vision impaired- to be further tested in functional context (squinting when reading sign. will assess further.) Perception  WFL Perception: Within Functional Limits Praxis  Praxis: Impaired Praxis Impairment Details: Motor planning Cognition Overall Cognitive Status: Impaired/Different from baseline Arousal/Alertness: Awake/alert Orientation Level: Person;Nonverbal/unable to assess (pt refused to answer questions due to agitation) Memory: Impaired Memory Impairment: Decreased short term memory Attention: Sustained Sustained Attention: Impaired Sustained Attention Impairment: Verbal basic;Functional basic Awareness: Impaired Awareness Impairment: Intellectual impairment Problem Solving: Impaired Problem Solving Impairment: Verbal basic;Functional basic Executive Function: Sequencing;Reasoning;Decision Biomedical engineer;Initiating;Organizing Reasoning: Impaired Reasoning Impairment: Functional basic;Verbal basic Sequencing: Impaired Sequencing  Impairment: Verbal basic;Functional basic Organizing: Impaired Organizing Impairment: Functional basic;Verbal basic Decision Making: Impaired Decision Making Impairment: Functional basic;Verbal basic Initiating: Impaired Self Monitoring: Impaired Behaviors: Restless;Poor frustration tolerance Safety/Judgment: Impaired Sensation Sensation Light Touch: Not tested Hot/Cold: Not tested Proprioception: Not tested Stereognosis: Not tested Additional Comments: NT due to pt agitation/potential agression Coordination Gross Motor Movements are Fluid and Coordinated: No Fine Motor Movements are Fluid and Coordinated: No 9 Hole Peg Test:  NT Motor  Motor Motor: Abnormal postural alignment and control  Trunk/Postural Assessment  Cervical Assessment Cervical Assessment: Exceptions to [facility] (forward head) Thoracic Assessment Thoracic Assessment: Within Functional Limits Lumbar Assessment Lumbar Assessment: Within Functional Limits Postural Control Postural Control: Within Functional Limits  Balance Dynamic Sitting Balance Sitting balance - Comments: requires supervision for safety with sitting on side of bed Extremity/Trunk Assessment RUE Assessment RUE Assessment: Within Functional Limits LUE Assessment LUE Assessment: Within Functional Limits  Care Tool Care Tool Self Care Eating        Oral Care         Bathing   Body parts bathed by patient: Right arm;Chest;Left arm;Abdomen;Right upper leg;Left upper leg;Face;Right lower leg;Left lower leg;Front perineal area Body parts bathed by helper: Buttocks   Assist Level: Moderate Assistance - Patient 50 - 74%    Upper Body Dressing(including orthotics)   What is the patient wearing?: Pull over shirt   Assist Level: Moderate Assistance - Patient 50 - 74%    Lower Body Dressing (excluding footwear)   What is the patient wearing?: Incontinence brief;Pants Assist for lower body dressing: Set up assist    Putting on/Taking off  footwear   What is the patient wearing?: Non-skid slipper socks Assist for footwear: Maximal Assistance - Patient 25 - 49%       Care Tool Toileting Toileting activity  Max A       Care Tool Bed Mobility Roll left and right activity   Roll left and right assist level: Minimal Assistance - Patient > 75%    Sit to lying activity   Sit to lying assist level: Moderate Assistance - Patient 50 - 74%    Lying to sitting on side of bed activity   Lying to sitting on side of bed assist level: the ability to move from lying on the back to sitting on the side of the bed with no back support.: Moderate Assistance - Patient 50 - 74%     Care Tool Transfers Sit to stand transfer   Sit to stand assist level: Minimal Assistance - Patient > 75%    Chair/bed transfer         Toilet transfer         Care Tool Cognition  Expression of Ideas and Wants Expression of Ideas and Wants: 2. Frequent difficulty - frequently exhibits difficulty with expressing needs and ideas  Understanding Verbal and Non-Verbal Content Understanding Verbal and Non-Verbal Content: 2. Sometimes understands - understands only basic conversations or simple, direct phrases. Frequently requires cues to understand   Memory/Recall Ability Memory/Recall Ability : None of the above were recalled   Refer to Care Plan for Long Term Goals  SHORT TERM GOAL WEEK 1 OT Short Term Goal 1 (Week 1): Pt will be A&Ox4 with external cues PRN OT Short Term Goal 2 (Week 1): Pt will participate in ADLs 3 days in a row without agitation OT Short Term Goal 3 (Week 1): Pt will complete bed mobility with close (S) OT Short Term Goal 4 (Week 1): Pt will bathe UB with min cuing for attention to task  Recommendations for other services: None    Skilled Therapeutic Intervention  Pt received supine in bed, attempting to answer a phone call using the call bell. Pt perseverative over using call bell as phone, arguing with OT about use of  phone/call bell. C/o unnumbered pain in R fingers. Pt unable to state where she was, or recognize family members in pictures. Attempted to  engage pt in ADL participation. Pt refused ADLs, screaming "no" and became increasingly agitated yelling "get the f**k out of my room! Leave me alone!". OT redirected pt, asking about home/hobbies/job but pt unable/refused to answer questions. Mood escalated rather quickly - pt cursing and demanding to be left alone. Pt continued to yell and curse at OT/OTS. NT made aware that telesitter was needed due to pt's agitation and potential for getting OOB.   OT found nursing walking in hallway with patient a short time later, so OT took over for NT's. Pt pleasantly confused and had no recollection of interaction just 10 minutes earlier with OT. Pt overall mod A for functional ambulation with +2 for safety. Pt walking with RW and being very unsafe with RW, picking it up, running to things with it. Pt needed MAX multimodal cues while ambulating but was able to get her sit EOB for dressing tasks. Patient more pleasant, but with extremely poor safety and overall body awareness within BADL tasks.  ADL: Pt completes BADL at overall MOD assist for MAX multimodal cues for safety, body awareness, impulsivity and attention. See functional navigator below for further details on BADL performance.   Pt left at end of session in bed with exit alarm on, posey bed alarm belt on, call light in reach and all needs met.  ADL Grooming: Minimal assistance;Maximal cueing Where Assessed-Grooming: Edge of bed Upper Body Bathing: Minimal assistance;Maximal cueing Where Assessed-Upper Body Bathing: Edge of bed Lower Body Bathing: Moderate assistance;Maximal cueing Where Assessed-Lower Body Bathing: Edge of bed Upper Body Dressing: Minimal cueing;Maximal cueing Where Assessed-Upper Body Dressing: Edge of bed Lower Body Dressing: Moderate assistance;Maximal assistance Where Assessed-Lower Body  Dressing: Edge of bed ADL Comments: could not assess due to pt agitation. pt refused any ADLs. Mobility  Bed Mobility Bed Mobility: Sitting - Scoot to Edge of Bed;Rolling Left;Rolling Right;Scooting to [facility] Rolling Right: Minimal Assistance - Patient > 75% Rolling Left: Minimal Assistance - Patient > 75% Sitting - Scoot to [employer] of Bed: Minimal Assistance - Patient > 75% Scooting to [facility]: Minimal Assistance - Patient > 75%   Discharge Criteria: Patient will be discharged from OT if patient refuses treatment 3 consecutive times without medical reason, if treatment goals not met, if there is a change in medical status, if patient makes no progress towards goals or if patient is discharged from hospital.  The above assessment, treatment plan, treatment alternatives and goals were discussed and mutually agreed upon: by patient  [facility] 01/03/2021, 12:59 PM

## 2021-01-03 NOTE — Evaluation (Signed)
Speech Language Pathology Assessment and Plan  Patient Details  Name: Courtney Davies MRN: [id number] Date of Birth: 17-Dec-1975  SLP Diagnosis: Cognitive Impairments;Dysphagia;Speech and Language deficits  Rehab Potential: Good ELOS: 3 weeks    Today's Date: 01/03/2021 SLP Individual Time: 4401-0272 SLP Individual Time Calculation (min): 55 min   Hospital Problem: Active Problems:   Anoxic brain injury [facility])  Past Medical History:  Past Medical History:  Diagnosis Date   Anemia 09/2015   Anemia    Arthritis    "knees" (04/23/2017)   Asthma    "teens; went away; came back" (04/23/2017)   B12 deficiency anemia 04/23/2017   Chronic bronchitis (HCC)    Chronic low back pain with sciatica    Chronic lower back pain    Diabetes mellitus without complication ([place])    Diabetes type 2, controlled ([place])    Elevated ferritin level    Fatty liver    GERD (gastroesophageal reflux disease)    GERD with stricture    Headache    "1-2/wk" (04/23/2017)   History of blood transfusion "plenty"   "related to anemia" (04/23/2017)   Hypertension    Inguinal hernia    Low back pain    Migraine    "1-2/month" (04/23/2017)   OA (osteoarthritis) of knee--left    Symptomatic anemia 04/23/2017   Vitamin B 12 deficiency    Past Surgical History:  Past Surgical History:  Procedure Laterality Date   ABDOMINAL HYSTERECTOMY  YRS AGO   [place]; 2002   ESOPHAGEAL DILATION  02/2020   by Dr Shana Chute   INGUINAL HERNIA REPAIR Bilateral 1980s    "total of 4 surgeries" (04/23/2017)   IR GASTROSTOMY TUBE MOD SED  12/13/2020   KNEE ARTHROSCOPY WITH MEDIAL MENISECTOMY Left 01/30/2018   Procedure: LEFT KNEE ARTHROSCOPY WITH PARTIAL LATERAL MENISCECTOMY;  Surgeon: Mcarthur Rossetti, MD;  Location: [facility];  Service: Orthopedics;  Laterality: Left;   KNEE CARTILAGE SURGERY Left    TUBAL LIGATION  2002    Assessment / Plan / Recommendation Clinical Impression Pt is a 45 y/o female  with PMH significant for HTN, B12 deficiency, covid 4 wks PTA, and OSA, who was admitted to [facility] on 12/01/20 for witnessed v-fib arrest at work.  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  CPR was initiated and she received 5 rounds of epi, 5 episodes of defib, and 400 mg of amio.  Epi drip was started with achievement of ROSC.  On arrival to ED she was intubated, EKG showed ST, HS troponin 65, K3.7, sodium 136.  She tested covid positive.  CT head/spine showed no acute abnormalities, CTA chest showed no PE but did show bilateral lower lobe consolidations.  Cardiology was consulted and recommended IV abx, IV heparin, ECHO, but no further inpatient cardiac workup (including cath) until improvement in mental status.  Neurology was consulted due to inability to protect airway due to mental status and ongoing encephalopathy.  MRI showed ABI.  Palliative was consulted for goals of care and family wishes to proceed with aggressive intervention at this time.  ID was consulted due to ongoing leukocytosis and fevers, and workup was essentially negative.  Pt did have a PEG placed on 8/30, but she pulled it out on 9/11 and it remains out.  Therapy evaluations were completed and pt was recommended for CIR due to need for aggressive multidisciplinary therapy. Patient admitted 01/02/21.  SLP administered the [facility] Mental Status Examination (SLUMS). Patient scored  6/30 points with a score of 27 or above considered normal. Patient demonstrated deficits in sustained attention, orientation, functional problem solving, safety awareness and recall. Patient with intermittent language of confusion but intermittent awareness present regarding confusion surrounding current situation. Patient demonstrated difficulty identifying family members in pictures and frequent word-finding deficits were also noted at the sentence level. Patient consumed trials of regular  textures with efficient mastication, however, patient with decreased attention to bolus. No overt s/s of aspiration noted with solids or large sips of thin liquids via straw. Recommend patient continue current diet of Dys. 3 textures with thin liquids with full supervision to maximize safety with PO intake. Patient remained calm and cooperative throughout session, however, at the very end of the session, patient became mildly verbally frustrated regarding not being able to go home. Patient was able to be redirected at the time but overall her mood appeared to change rather quickly and without warning. Patient would benefit from skilled SLP intervention to maximize her cognitive-linguistic and swallowing function prior to discharge.     Skilled Therapeutic Interventions          Administered a cognitive-linguistic evaluation and BSE, please see above for details. SLP provided external memory aids to maximize recall orientation to place and time. Patient utilized with Mod A verbal cues and extra time.     SLP Assessment  Patient will need skilled Speech Lanaguage Pathology Services during CIR admission    Recommendations  SLP Diet Recommendations: Dysphagia 3 (Mech soft);Thin Liquid Administration via: Cup;Straw Medication Administration: Crushed with puree Supervision: Patient able to self feed;Full supervision/cueing for compensatory strategies Compensations: Minimize environmental distractions;Slow rate;Small sips/bites Postural Changes and/or Swallow Maneuvers: Seated upright 90 degrees Oral Care Recommendations: Oral care BID Recommendations for Other Services: Neuropsych consult Patient destination: Home Follow up Recommendations: Outpatient SLP;24 hour supervision/assistance Equipment Recommended: None recommended by SLP    SLP Frequency 3 to 5 out of 7 days   SLP Duration  SLP Intensity  SLP Treatment/Interventions 3 weeks  Minumum of 1-2 x/day, 30 to 90 minutes  Cognitive  remediation/compensation;Dysphagia/aspiration precaution training;Internal/external aids;Cueing hierarchy;Environmental controls;Therapeutic Activities;Functional tasks;Patient/family education;Speech/Language facilitation    Pain No/Denies Pain   Prior Functioning  Lives With: Alone  SLP Evaluation Cognition Overall Cognitive Status: Impaired/Different from baseline Arousal/Alertness: Awake/alert Orientation Level: Oriented to person;Disoriented to place;Disoriented to time;Disoriented to situation Attention: Sustained Sustained Attention: Impaired Sustained Attention Impairment: Verbal basic;Functional basic Memory: Impaired Memory Impairment: Decreased short term memory Awareness: Impaired Awareness Impairment: Intellectual impairment Problem Solving: Impaired Problem Solving Impairment: Verbal basic;Functional basic Executive Function: Sequencing;Reasoning;Decision Biomedical engineer;Initiating;Organizing Reasoning: Impaired Reasoning Impairment: Functional basic;Verbal basic Sequencing: Impaired Sequencing Impairment: Verbal basic;Functional basic Organizing: Impaired Organizing Impairment: Functional basic;Verbal basic Decision Making: Impaired Decision Making Impairment: Functional basic;Verbal basic Initiating: Impaired Self Monitoring: Impaired Behaviors: Restless;Poor frustration tolerance Safety/Judgment: Impaired [employer] Scales of Cognitive Functioning: Confused/agitated  Comprehension Auditory Comprehension Overall Auditory Comprehension: Appears within functional limits for tasks assessed Expression Verbal Expression Overall Verbal Expression: Impaired Initiation: No impairment Automatic Speech: Name;Social Response Repetition: No impairment Naming: Impairment Confrontation: Impaired Verbal Errors: Perseveration;Phonemic paraphasias Written Expression Dominant Hand: Right Written Expression: Not tested Oral Motor Oral  Motor/Sensory Function Overall Oral Motor/Sensory Function: Within functional limits Motor Speech Overall Motor Speech: Appears within functional limits for tasks assessed  Care Tool Care Tool Cognition Ability to hear (with hearing aid or hearing appliances if normally used Ability to hear (with  hearing aid or hearing appliances if normally used): 0. Adequate - no difficulty in normal conservation, social interaction, listening to TV   Expression of Ideas and Wants Expression of Ideas and Wants: 2. Frequent difficulty - frequently exhibits difficulty with expressing needs and ideas   Understanding Verbal and Non-Verbal Content Understanding Verbal and Non-Verbal Content: 2. Sometimes understands - understands only basic conversations or simple, direct phrases. Frequently requires cues to understand  Memory/Recall Ability Memory/Recall Ability : None of the above were recalled   Bedside Swallowing Assessment General Date of Onset: 12/06/20 Previous Swallow Assessment: BSE, recommended Dys. 3 textures with thin liquids Diet Prior to this Study: Dysphagia 3 (soft);Thin liquids Temperature Spikes Noted: No Respiratory Status: Room air History of Recent Intubation: Yes Length of Intubations (days): 5 days Date extubated: 12/06/20 Behavior/Cognition: Alert;Distractible;Requires cueing;Cooperative Oral Cavity - Dentition: Adequate natural dentition Self-Feeding Abilities: Able to feed self Patient Positioning: Upright in bed Baseline Vocal Quality: Normal Volitional Cough: Strong Volitional Swallow: Able to elicit  Thin Liquid Thin Liquid: Within functional limits Presentation: Cup;Straw Nectar Thick Nectar Thick Liquid: Not tested Honey Thick Honey Thick Liquid: Not tested Puree Puree: Not tested Solid Solid: Impaired Presentation: Self Fed;Spoon Oral Phase Impairments: Poor awareness of bolus Other Comments: intermittent decreased attention to PO BSE Assessment Risk for  Aspiration Impact on safety and function: Moderate aspiration risk Other Related Risk Factors: Cognitive impairment  Short Term Goals: Week 1: SLP Short Term Goal 1 (Week 1): Patient will demonstrate efficient mastication with complete oral clearance with trials of Dys. 2 textures over 2 sessions with Min verbal cues prior to upgrade. SLP Short Term Goal 2 (Week 1): Patient will utilize external memory aids for recall to place, date and situation with Mod verbal and visual cues. SLP Short Term Goal 3 (Week 1): Patient will demonstrate functional problem solving for basic and familiar tasks with Mod verbal cues. SLP Short Term Goal 4 (Week 1): Patient will demonstrate sustained attention to functional tasks for 15 minutes with Mod verbal cues for redirection. SLP Short Term Goal 5 (Week 1): Patient will utilize word-finding strategies at the sentence level with Mod multimodal cues.  Refer to Care Plan for Long Term Goals  Recommendations for other services: Neuropsych  Discharge Criteria: Patient will be discharged from SLP if patient refuses treatment 3 consecutive times without medical reason, if treatment goals not met, if there is a change in medical status, if patient makes no progress towards goals or if patient is discharged from hospital.  The above assessment, treatment plan, treatment alternatives and goals were discussed and mutually agreed upon: by patient  Stony Stegmann 01/03/2021, 12:39 PM

## 2021-01-03 NOTE — Progress Notes (Signed)
Inpatient Rehabilitation Care Coordinator Assessment and Plan Patient Details  Name: Courtney Davies MRN: [id number] Date of Birth: 12-14-1975  Today's Date: 01/03/2021  Hospital Problems: Active Problems:   Anoxic brain injury [facility])  Past Medical History:  Past Medical History:  Diagnosis Date   Anemia 09/2015   Anemia    Arthritis    "knees" (04/23/2017)   Asthma    "teens; went away; came back" (04/23/2017)   B12 deficiency anemia 04/23/2017   Chronic bronchitis (HCC)    Chronic low back pain with sciatica    Chronic lower back pain    Diabetes mellitus without complication ([place])    Diabetes type 2, controlled ([place])    Elevated ferritin level    Fatty liver    GERD (gastroesophageal reflux disease)    GERD with stricture    Headache    "1-2/wk" (04/23/2017)   History of blood transfusion "plenty"   "related to anemia" (04/23/2017)   Hypertension    Inguinal hernia    Low back pain    Migraine    "1-2/month" (04/23/2017)   OA (osteoarthritis) of knee--left    Symptomatic anemia 04/23/2017   Vitamin B 12 deficiency    Past Surgical History:  Past Surgical History:  Procedure Laterality Date   ABDOMINAL HYSTERECTOMY  YRS AGO   [place]; 2002   ESOPHAGEAL DILATION  02/2020   by Dr Shana Chute   INGUINAL HERNIA REPAIR Bilateral 1980s    "total of 4 surgeries" (04/23/2017)   IR GASTROSTOMY TUBE MOD SED  12/13/2020   KNEE ARTHROSCOPY WITH MEDIAL MENISECTOMY Left 01/30/2018   Procedure: LEFT KNEE ARTHROSCOPY WITH PARTIAL LATERAL MENISCECTOMY;  Surgeon: Mcarthur Rossetti, MD;  Location: [facility];  Service: Orthopedics;  Laterality: Left;   KNEE CARTILAGE SURGERY Left    TUBAL LIGATION  2002   Social History:  reports that she has an unknown smoking status. She has never used smokeless tobacco. She reports that she does not currently use alcohol. She reports that she does not use drugs.  Family / Support Systems Marital Status: Single Patient Roles:  Parent Spouse/Significant Other: N/A Children: 2 adult sons- Pierz and Lytle Michaels Other Supports: various family members Anticipated Caregiver: Mother [name] [phone] Ability/Limitations of Caregiver: None reported Caregiver Availability: 24/7 Family Dynamics: Pt lived at home with her 2 adult sons.  Social History Preferred language: English Religion: Non-Denominational Cultural Background: Pt worked with [employer] PT. Education: high school grad Health Literacy - How often do you need to have someone help you when you read instructions, pamphlets, or other written material from your doctor or pharmacy?: Never Writes: Yes Employment Status: Employed Date Retired/Disabled/Unemployed: Dealer Name of Employer: N/A Return to Work Plans: TBD Public relations account executive Issues: Denies Guardian/Conservator: N/A   Abuse/Neglect Abuse/Neglect Assessment Can Be Completed: Unable to assess, patient is non-responsive or altered mental status Physical Abuse: Denies Verbal Abuse: Denies Sexual Abuse: Denies Exploitation of patient/patient's resources: Denies Self-Neglect: Denies  Patient response to: Social Isolation - How often do you feel lonely or isolated from those around you?: Never  Emotional Status Pt's affect, behavior and adjustment status: Pt in good spirits at time of visit. Recent Psychosocial Issues: Denies Psychiatric History: Denies Substance Abuse History: Pt admits she quit smoking cigarettes at time of admision, as well as EtoH.  Patient / Family Perceptions, Expectations & Goals Pt/Family understanding of illness & functional limitations: Pt family has a general understanding of pt care needs Premorbid pt/family roles/activities: Independent  Anticipated changes in roles/activities/participation: Assistance with ADLs/IADLs Pt/family expectations/goals: Pt unable to answer question appropriately.  Community Resources Express Scripts: None Premorbid Home  Care/DME Agencies: None Transportation available at discharge: TBD Is the patient able to respond to transportation needs?: Yes In the past 12 months, has lack of transportation kept you from medical appointments or from getting medications?: No In the past 12 months, has lack of transportation kept you from meetings, work, or from getting things needed for daily living?: No Resource referrals recommended: Neuropsychology  Discharge Planning Living Arrangements: Children Support Systems: Parent, Other relatives, Children Type of Residence: Private residence Insurance Resources: Multimedia programmer (specify) ([place] Medicaid UHC) Museum/gallery curator Resources: Employment, Secondary school teacher Screen Referred: No Living Expenses: Rent Money Management: Patient Does the patient have any problems obtaining your medications?: No Home Management: Pt and sons managed home care needs. Patient/Family Preliminary Plans: TBD Care Coordinator Barriers to Discharge: Insurance for SNF coverage Care Coordinator Anticipated Follow Up Needs: HH/OP Expected length of stay: 2-3 weeks  Clinical Impression SW met with pt in room to introduce self, explain role, and discuss discharge process. Pt is not a English as a second language teacher. No DME. No HCPOA.   Courtney Davies A Courtney Davies 01/03/2021, 4:08 PM

## 2021-01-03 NOTE — Plan of Care (Signed)
  Problem: RH Dressing Goal: LTG Patient will perform lower body dressing w/assist (OT) Description: LTG: Patient will perform lower body dressing with assist, with/without cues in positioning using equipment (OT) Flowsheets (Taken 01/03/2021 1521) LTG: Pt will perform lower body dressing with assistance level of: Supervision/Verbal cueing   Problem: RH Toileting Goal: LTG Patient will perform toileting task (3/3 steps) with assistance level (OT) Description: LTG: Patient will perform toileting task (3/3 steps) with assistance level (OT)  Flowsheets (Taken 01/03/2021 1521) LTG: Pt will perform toileting task (3/3 steps) with assistance level: Supervision/Verbal cueing   Problem: RH Attention Goal: LTG Patient will demonstrate this level of attention during functional activites (OT) Description: LTG:  Patient will demonstrate this level of attention during functional activites  (OT) Flowsheets (Taken 01/03/2021 1521) Patient will demonstrate this level of attention during functional activites: Selective LTG: Patient will demonstrate this level of attention during functional activites (OT): Minimal Assistance - Patient > 75%   Problem: RH Awareness Goal: LTG: Patient will demonstrate awareness during functional activites type of (OT) Description: LTG: Patient will demonstrate awareness during functional activites type of (OT) Flowsheets (Taken 01/03/2021 1521) LTG: Patient will demonstrate awareness during functional activites type of (OT): Minimal Assistance - Patient > 75%

## 2021-01-03 NOTE — Progress Notes (Signed)
Inpatient Rehabilitation  Patient information reviewed and entered into eRehab system by Torri Langston M. Jaquavian Firkus, M.A., CCC/SLP, PPS Coordinator.  Information including medical coding, functional ability and quality indicators will be reviewed and updated through discharge.    

## 2021-01-03 NOTE — Progress Notes (Addendum)
PROGRESS NOTE   Subjective/Complaints: Pt had a fair night. C/o pain in right arm. On phone with family when I entered  ROS: Patient denies fever, rash, sore throat, blurred vision, nausea, vomiting, diarrhea, cough, shortness of breath or chest pain, joint or back pain, headache, or mood change.    Objective:   No results found. Recent Labs    01/03/21 0421  WBC 6.0  HGB 9.9*  HCT 32.0*  PLT 287   Recent Labs    01/03/21 0421  NA 135  K 3.8  CL 101  CO2 25  GLUCOSE 157*  BUN 12  CREATININE 0.77  CALCIUM 8.9   No intake or output data in the 24 hours ending 01/03/21 1129      Physical Exam: Vital Signs Blood pressure 110/73, pulse 93, temperature 99.2 F (37.3 C), temperature source Oral, resp. rate 18, height 5' 6"  (1.676 m), weight 97.1 kg, SpO2 92 %.  General: Alert and oriented x 3, No apparent distress HEENT: Head is normocephalic, atraumatic, PERRLA, EOMI, sclera anicteric, oral mucosa pink and moist, dentition intact, ext ear canals clear,  Neck: Supple without JVD or lymphadenopathy Heart: Reg rate and rhythm. No murmurs rubs or gallops Chest: CTA bilaterally without wheezes, rales, or rhonchi; no distress Abdomen: Soft, non-tender, non-distended, bowel sounds positive. Extremities: No clubbing, cyanosis, or edema. Pulses are 2+ Psych: Pt's affect is appropriate. Pt is cooperative Skin: Clean and intact without signs of breakdown Neuro:  Pt alert. Follows simple commands. Oriented to person not place. Poor insight and awareness. Moves all 4's, normal sensation Musculoskeletal: Full ROM, No pain with AROM or PROM in the neck, trunk, or extremities. Posture appropriate     Assessment/Plan: 1. Functional deficits which require 3+ hours per day of interdisciplinary therapy in a comprehensive inpatient rehab setting. Physiatrist is providing close team supervision and 24 hour management of active  medical problems listed below. Physiatrist and rehab team continue to assess barriers to discharge/monitor patient progress toward functional and medical goals  Care Tool:  Bathing              Bathing assist       Upper Body Dressing/Undressing Upper body dressing   What is the patient wearing?: Hospital gown only    Upper body assist Assist Level: Moderate Assistance - Patient 50 - 74%    Lower Body Dressing/Undressing Lower body dressing            Lower body assist       Toileting Toileting    Toileting assist Assist for toileting: Total Assistance - Patient < 25%     Transfers Chair/bed transfer  Transfers assist     Chair/bed transfer assist level: 2 Helpers     Locomotion Ambulation   Ambulation assist              Walk 10 feet activity   Assist           Walk 50 feet activity   Assist           Walk 150 feet activity   Assist           Walk 10 feet on  uneven surface  activity   Assist           Wheelchair     Assist               Wheelchair 50 feet with 2 turns activity    Assist            Wheelchair 150 feet activity     Assist          Blood pressure 110/73, pulse 93, temperature 99.2 F (37.3 C), temperature source Oral, resp. rate 18, height 5' 6"  (1.676 m), weight 97.1 kg, SpO2 92 %.   Medical Problem List and Plan: 1.  Decline in ADL, mobility , and cognition secondary to anoxic brain injury             -patient may  shower             -ELOS/Goals: 18-21d  -Patient is beginning CIR therapies today including PT, OT, and SLP  2.  Antithrombotics: -DVT/anticoagulation:  Pharmaceutical: Lovenox             -antiplatelet therapy: N/A 3. Pain Management: Tylenol prn.  4. Mood: LCSW to follow for evaluation and support.              -antipsychotic agents: Has been taking Depakote once a day on average due to refusal of meds             --  Risperdal scheduled bid as  requiring Haldol/ativan at nights.   5. Neuropsych: This patient is not capable of making decisions on her own behalf. 6. Skin/Wound Care:  Monitor abdominal wound for healing.             --routine pressure relief measures.   7. Fluids/Electrolytes/Nutrition: encourage PO . I personally reviewed the patient's labs today.    -protein supp for low albumin  8. Cellulitis abdomen: On Keflex --through 9/27. Continue to monitor for healing.  9. T2DM: Hgb A1c-6.9. Continue insulin glargine ---transition to trulicity at discharge?             --monitor BS ac/hs and use SSI for elevated BS.  10. HTN: Monitor BP tid--continue Cozaar 11. SVT/NSVT: On coreg and amiodarone bid for rate control.   12. Dysphagia:   -continue D3, thins for now     LOS: 1 days A FACE TO FACE EVALUATION WAS PERFORMED  Meredith Staggers 01/03/2021, 11:29 AM

## 2021-01-03 NOTE — Evaluation (Signed)
Physical Therapy Assessment and Plan  Patient Details  Name: Courtney Davies MRN: [id number] Date of Birth: 1975-04-26  PT Diagnosis: Cognitive deficits, Difficulty walking, Impaired cognition, and Muscle weakness Rehab Potential: Good ELOS: 3 weeks   Today's Date: 01/03/2021 PT Individual Time: 8338-2505 PT Individual Time Calculation (min): 45 min  and Today's Date: 01/03/2021 PT Missed Time: 15 Minutes Missed Time Reason: Patient fatigue   Hospital Problem: Active Problems:   Anoxic brain injury [facility])   Past Medical History:  Past Medical History:  Diagnosis Date   Anemia 09/2015   Anemia    Arthritis    "knees" (04/23/2017)   Asthma    "teens; went away; came back" (04/23/2017)   B12 deficiency anemia 04/23/2017   Chronic bronchitis (HCC)    Chronic low back pain with sciatica    Chronic lower back pain    Diabetes mellitus without complication ([place])    Diabetes type 2, controlled ([place])    Elevated ferritin level    Fatty liver    GERD (gastroesophageal reflux disease)    GERD with stricture    Headache    "1-2/wk" (04/23/2017)   History of blood transfusion "plenty"   "related to anemia" (04/23/2017)   Hypertension    Inguinal hernia    Low back pain    Migraine    "1-2/month" (04/23/2017)   OA (osteoarthritis) of knee--left    Symptomatic anemia 04/23/2017   Vitamin B 12 deficiency    Past Surgical History:  Past Surgical History:  Procedure Laterality Date   ABDOMINAL HYSTERECTOMY  YRS AGO   [place]; 2002   ESOPHAGEAL DILATION  02/2020   by Dr Shana Chute   INGUINAL HERNIA REPAIR Bilateral 1980s    "total of 4 surgeries" (04/23/2017)   IR GASTROSTOMY TUBE MOD SED  12/13/2020   KNEE ARTHROSCOPY WITH MEDIAL MENISECTOMY Left 01/30/2018   Procedure: LEFT KNEE ARTHROSCOPY WITH PARTIAL LATERAL MENISCECTOMY;  Surgeon: Mcarthur Rossetti, MD;  Location: [facility];  Service: Orthopedics;  Laterality: Left;   KNEE CARTILAGE SURGERY Left    TUBAL  LIGATION  2002    Assessment & Plan Clinical Impression: Patient is a 45 year old female with history of T2DM, Vitamin B 12 deficiency, OA left knee, chronic LBP, GERD/dysphagia s/p esophageal dilatation, Covid + three weeks PTA on 12/01/20 with VF cardiac arrest, fall 4 feet and ROSC after 10 of minutes of ACLS. UDS negative.  She was intubated for airway protection, had elevated troponins and CT chest negative for PE but showed bilateral lower lobe consolidations. She was still covid + and started on IV antibiotics due to concerns of aspiration PNA and IV heparin. 2D echo showed EF 40-45% with G1DD and mild to moderate MR. She was maintained on IV amiodarone with episodes of PSVT without VT and possible PAF.  Dr. Irish Lack consulted and recommended cardiac cath for work up pending neurological recovery and no need for long term anticoagulation. .      EEG done showing some slowing due to moderate diffuse encephalopathy . She required reintubation 08/19 due to minimal responsiveness, developed fevers and had difficulty handling secretions. Dr. Leonel Ramsay consulted for input on 08/21 and MRI brain done revealing areas of reduced diffusion along bilateral hippocampi as well as bilateral cerebral cortices c/w anoxic BI and prolonged recovery predicted by neurology.  MRI C-spine without soft tissue or ligamentous injury and mild degenerative changes. She tolerated extubation by 08/23 but kept NPO due to cognitive  based dysphagia with resistance to trials of orals. She has had bouts of agitation and palliative care consulted to discuss goals of care. Family elected on full scope of care and PEG placed on 08/30.    She started developing leucocytosis with fevers and started on ceftriaxone. Dr. [place] Bali /ID consulted for input. CT abdomen/pelvis showed stranding/edema of SQ abdominal wall and was negative for abscess or fluid collection.  Patient felt to have cellulitis and IV ceftriaxone transitioned to Keflex  for 14 total days of antibiotic coverage.  She started developing foul, thick drainage from PEG site on 09/09 and family requested PEG removal as intake was improving. Psychiatry consulted to help manage aggressive behavior/mood stabilization. She was started on Depakote with haldol/ativan IV as well as restraints prn. Patient pulled out her PEG and local wound care ongoing. Also noted to have right thyroid nodules on ultrasound. She continues to be limited by cognitive deficits, balance deficits, poor safety awareness with internal/external distraction, difficulty with one step commands as well as problems with mobility. Patient transferred to CIR on 01/02/2021 .   Patient currently requires mod with mobility secondary to muscle weakness, decreased cardiorespiratoy endurance, decreased initiation, decreased attention, decreased awareness, decreased problem solving, decreased safety awareness, decreased memory, and delayed processing, and decreased sitting balance, decreased standing balance, and decreased balance strategies.  Prior to hospitalization, patient was independent  with mobility and lived with Alone in a House home.  Home access is 5Stairs to enter.  Patient will benefit from skilled PT intervention to maximize safe functional mobility, minimize fall risk, and decrease caregiver burden for planned discharge home with 24 hour supervision.  Anticipate patient will benefit from follow up OP at discharge.  PT - End of Session Activity Tolerance: Tolerates 30+ min activity with multiple rests Endurance Deficit: Yes PT Assessment Rehab Potential (ACUTE/IP ONLY): Good PT Patient demonstrates impairments in the following area(s): Balance;Behavior;Endurance;Motor;Pain;Perception;Safety;Sensory PT Transfers Functional Problem(s): Bed Mobility;Bed to Chair;Car;Furniture PT Locomotion Functional Problem(s): Ambulation;Stairs PT Plan PT Intensity: Minimum of 1-2 x/day ,45 to 90 minutes PT Frequency: 5  out of 7 days PT Duration Estimated Length of Stay: 3 weeks PT Treatment/Interventions: Ambulation/gait training;Community reintegration;DME/adaptive equipment instruction;Psychosocial support;Neuromuscular re-education;Stair training;UE/LE Strength taining/ROM;Wheelchair propulsion/positioning;Balance/vestibular training;Functional electrical stimulation;Discharge planning;Pain management;Skin care/wound management;Therapeutic Activities;UE/LE Coordination activities;Cognitive remediation/compensation;Disease management/prevention;Functional mobility training;Patient/family education;Splinting/orthotics;Therapeutic Exercise;Visual/perceptual remediation/compensation PT Transfers Anticipated Outcome(s): Supervision PT Locomotion Anticipated Outcome(s): Supervision PT Recommendation Follow Up Recommendations: 24 hour supervision/assistance;Outpatient PT Patient destination: Home Equipment Recommended: To be determined   PT Evaluation Precautions/Restrictions Precautions Precaution Comments: incontinence, abdominal wound (from PEG tube) Restrictions Weight Bearing Restrictions: No Other Position/Activity Restrictions: posey waist restraint, all bed rails General Chart Reviewed: Yes Family/Caregiver Present: No Vital SignsTherapy Vitals Temp: 98.2 F (36.8 C) Pulse Rate: 80 Resp: 18 BP: 101/63 Patient Position (if appropriate): Lying Oxygen Therapy SpO2: 100 % O2 Device: Room Air Pain   Pain Interference Pain Interference Pain Effect on Sleep: 8. Unable to answer Pain Interference with Therapy Activities: 8. Unable to answer Pain Interference with Day-to-Day Activities: 8. Unable to answer Home Living/Prior Functioning Home Living Living Arrangements: Children Available Help at Discharge: Family;Available 24 hours/day Type of Home: House Home Access: Stairs to enter [employer] of Steps: 5 Entrance Stairs-Rails: Right;Left Home Layout: One level Bathroom  Shower/Tub:  (need to confirm with pt's mother) Additional Comments:  (pt to live with mother upon d/c...need to confirm)  Lives With: Alone Prior Function Level of Independence: Independent with transfers;Independent with homemaking with ambulation;Independent with gait  Able to  Take Stairs?: Yes Comments: anticipate independent, per chart working, pt unable and no family present Vision/Perception  Vision - History Ability to See in Adequate Light: 0 Adequate Perception Perception: Within Functional Limits Praxis Praxis: Impaired Praxis Impairment Details: Motor planning  Cognition Overall Cognitive Status: Impaired/Different from baseline Arousal/Alertness: Awake/alert Orientation Level: Oriented to person;Disoriented to place;Disoriented to time;Disoriented to situation Attention: Sustained Sustained Attention: Impaired Sustained Attention Impairment: Verbal basic;Functional basic Memory: Impaired Memory Impairment: Decreased short term memory Awareness: Impaired Awareness Impairment: Intellectual impairment Problem Solving: Impaired Problem Solving Impairment: Verbal basic;Functional basic Sequencing: Impaired Decision Making: Impaired Initiating: Impaired Behaviors: Restless;Poor frustration tolerance Safety/Judgment: Impaired [employer] Scales of Cognitive Functioning: Confused/agitated Sensation Sensation Light Touch: Not tested Hot/Cold: Not tested Proprioception: Not tested Stereognosis: Not tested Additional Comments: Not tested due to pt agitation/potential agression Coordination Gross Motor Movements are Fluid and Coordinated: No Fine Motor Movements are Fluid and Coordinated: No Motor  Motor Motor: Motor apraxia Motor - Skilled Clinical Observations: Mild motor planning deficits   Trunk/Postural Assessment  Cervical Assessment Cervical Assessment:  (forward head) Thoracic Assessment Thoracic Assessment:  (rounded shoulders) Lumbar  Assessment Lumbar Assessment:  (posterior pelvic tilt) Postural Control Postural Control: Within Functional Limits  Balance Balance Balance Assessed: Yes Static Sitting Balance Static Sitting - Balance Support: Feet supported Static Sitting - Level of Assistance: 5: Stand by assistance Dynamic Sitting Balance Dynamic Sitting - Balance Support: Feet supported Dynamic Sitting - Level of Assistance: 4: Min assist Sitting balance - Comments: requires supervision for safety with sitting on side of bed Static Standing Balance Static Standing - Balance Support: During functional activity Static Standing - Level of Assistance: 3: Mod assist Dynamic Standing Balance Dynamic Standing - Balance Support: During functional activity Dynamic Standing - Level of Assistance: 3: Mod assist Extremity Assessment  RLE Assessment RLE Assessment: Exceptions to [facility] General Strength Comments: Grossly 4/5, per clinical observation LLE Assessment LLE Assessment: Exceptions to [facility] General Strength Comments: Grossly 4/5, per clinical observation  Care Tool Care Tool Bed Mobility Roll left and right activity   Roll left and right assist level: Minimal Assistance - Patient > 75%    Sit to lying activity   Sit to lying assist level: 2 Helpers    Lying to sitting on side of bed activity   Lying to sitting on side of bed assist level: the ability to move from lying on the back to sitting on the side of the bed with no back support.: Moderate Assistance - Patient 50 - 74%     Care Tool Transfers Sit to stand transfer   Sit to stand assist level: 2 Helpers    Chair/bed transfer   Chair/bed transfer assist level: Maximal Assistance - Patient 25 - 49%     Toilet transfer   Assist Level: Moderate Assistance - Patient 50 - 74%    Car transfer Car transfer activity did not occur: Safety/medical concerns        Care Tool Locomotion Ambulation   Assist level: Moderate Assistance - Patient 50 -  74% Assistive device: Hand held assist Max distance: 300'  Walk 10 feet activity   Assist level: Moderate Assistance - Patient - 50 - 74% Assistive device: Hand held assist   Walk 50 feet with 2 turns activity   Assist level: Moderate Assistance - Patient - 50 - 74% Assistive device: Hand held assist  Walk 150 feet activity   Assist level: Moderate Assistance - Patient - 50 - 74% Assistive device: Hand held  assist  Walk 10 feet on uneven surfaces activity Walk 10 feet on uneven surfaces activity did not occur: Safety/medical concerns      Stairs   Assist level: Moderate Assistance - Patient - 50 - 74% Stairs assistive device: 2 hand rails Max number of stairs: 8  Walk up/down 1 step activity   Walk up/down 1 step (curb) assist level: Moderate Assistance - Patient - 50 - 74% Walk up/down 1 step or curb assistive device: 2 hand rails    Walk up/down 4 steps activity Walk up/down 4 steps assist level: Moderate Assistance - Patient - 50 - 74% Walk up/down 4 steps assistive device: 2 hand rails  Walk up/down 12 steps activity Walk up/down 12 steps activity did not occur: Safety/medical concerns      Pick up small objects from floor Pick up small object from the floor (from standing position) activity did not occur: Safety/medical concerns      Wheelchair Is the patient using a wheelchair?: No          Wheel 50 feet with 2 turns activity      Wheel 150 feet activity        Refer to Care Plan for Long Term Goals  SHORT TERM GOAL WEEK 1 PT Short Term Goal 1 (Week 1): Pt will perform bed mobility with minA. PT Short Term Goal 2 (Week 1): Pt will perform bed to chair transfer with minA. PT Short Term Goal 3 (Week 1): Pt will ambulate x100' with minA. PT Short Term Goal 4 (Week 1): Pt will complete balance assessment.  Recommendations for other services: None   Skilled Therapeutic Intervention  Evaluation completed (see details above and below) with education on PT POC and  goals and individual treatment initiated with focus on bed mobility, balance, transfers, and ambulation. Pt received supine in bed with posey belt on. Agreeable to therapy though very confused and is not aware of situation, appearing to think that therapist is a family member of some kind. Pt performs supine to sidelying with bed features and cues for sequencing, but requires modA for sidelying to sit as pt lacks initiation to complete. Pt impulsively stands form EOB and begins walking. PT ambulates with pt providing tactile cues at trunk and hips for posture and stability, as pt has increased postural sway, especially when distracted, and requires frequent modA for safety. Pt very internally and externally distracted. PT is able to guide pt to steps and pt completes x8 3" steps with modA for trunk support and balance. Pt appearing fatigued to PT guides pt back to room and encourages pt to return to bed,a s her fatigue affected her safety and postural control. Pt sits briefly but returns to standing and ambulates additional x300' with HHA and modA frequently. Pt eventually returns to room and sits at foot of bed, attempting to lay backward. PT able to assist pt to sitting with +2 help (OT called from hallway). Pt performs sit to stand with maxA +2 and decreased initiation. Sidesteps toward [facility] with multimodal cueing. Pt then returns to supine with modA +2. Left with alarm intact, posey belt in place, and all needs within reach.  Mobility Bed Mobility Bed Mobility: Supine to Sit;Sit to Supine Supine to Sit: Moderate Assistance - Patient 50-74% Sit to Supine: 2 Helpers Transfers Transfers: Stand Pivot Transfers;Stand to Sit;Sit to Stand Sit to Stand: Minimal Assistance - Patient > 75% Stand to Sit: Minimal Assistance - Patient > 75% Stand Pivot Transfers: Moderate Assistance -  Patient 50 - 74% Stand Pivot Transfer Details: Tactile cues for initiation;Tactile cues for sequencing;Tactile cues for  posture;Tactile cues for weight shifting;Tactile cues for placement;Verbal cues for sequencing Transfer (Assistive device): 1 person hand held assist Locomotion  Gait Ambulation: Yes Gait Assistance: Moderate Assistance - Patient 50-74% Gait Distance (Feet): 300 Feet Assistive device: 1 person hand held assist Gait Assistance Details: Tactile cues for posture;Verbal cues for sequencing;Verbal cues for gait pattern;Tactile cues for sequencing Gait Gait: Yes Gait Pattern: Impaired Gait Pattern: Decreased stance time - left Gait velocity: reduced Stairs / Additional Locomotion Stairs: Yes Stairs Assistance: Moderate Assistance - Patient 50 - 74% Stair Management Technique: Two rails Number of Stairs: 8 Height of Stairs: 3 Curb: Moderate Assistance - Patient 50 - 74% Wheelchair Mobility Wheelchair Mobility: No   Discharge Criteria: Patient will be discharged from PT if patient refuses treatment 3 consecutive times without medical reason, if treatment goals not met, if there is a change in medical status, if patient makes no progress towards goals or if patient is discharged from hospital.  The above assessment, treatment plan, treatment alternatives and goals were discussed and mutually agreed upon: No family available/patient unable  Breck Coons, PT, DPT 01/03/2021, 4:12 PM

## 2021-01-04 LAB — GLUCOSE, CAPILLARY
Glucose-Capillary: 128 mg/dL — ABNORMAL HIGH (ref 70–99)
Glucose-Capillary: 129 mg/dL — ABNORMAL HIGH (ref 70–99)
Glucose-Capillary: 143 mg/dL — ABNORMAL HIGH (ref 70–99)
Glucose-Capillary: 221 mg/dL — ABNORMAL HIGH (ref 70–99)

## 2021-01-04 MED ORDER — RISPERIDONE 1 MG PO TBDP
1.0000 mg | ORAL_TABLET | Freq: Two times a day (BID) | ORAL | Status: AC
  Administered 2021-01-04 – 2021-01-05 (×3): 1 mg via ORAL
  Filled 2021-01-04 (×3): qty 1

## 2021-01-04 MED ORDER — ACETAMINOPHEN 325 MG PO TABS
650.0000 mg | ORAL_TABLET | Freq: Four times a day (QID) | ORAL | Status: DC
  Administered 2021-01-04 – 2021-01-24 (×60): 650 mg via ORAL
  Filled 2021-01-04 (×55): qty 2

## 2021-01-04 NOTE — Progress Notes (Signed)
Occupational Therapy ABI Note  Patient Details  Name: Courtney Davies MRN: [id number] Date of Birth: 08-14-1975  Today's Date: 01/04/2021 OT Individual Time: 1430-1530 OT Individual Time Calculation (min): 60 min    Short Term Goals: Week 1:  OT Short Term Goal 1 (Week 1): Pt will be A&Ox4 with external cues PRN OT Short Term Goal 2 (Week 1): Pt will participate in ADLs 3 days in a row without agitation OT Short Term Goal 3 (Week 1): Pt will complete bed mobility with close (S) OT Short Term Goal 4 (Week 1): Pt will bathe UB with min cuing for attention to task  Skilled Therapeutic Interventions/Progress Updates:    Pt received in bed not wanting to get up. Pt with no pain reported. Pt agreeable to reposition in bed following cues for hand placement to assist OT to pull up in bed, however not going initiating to assist with bed mobility to [facility] therefore +2 A used. Pt given applesauce, however spills and only eats 2 bites requiring VC For using spoon to self feed. Pt and OT work on visual scanning and rapport building reviewing pictures of family members with obvious errors calling multiple people of different genders "my niece" or "my son" with little to no awareness.  Pt completes sorting tiles by color and by shape painted on tile with tiles set up on L to encourage weight shifting L in bed dt R lean. Pt able to sort by shape with 60% accuracy and demo decrased mental flexibility requiring max cuing to sort by color instead of missing together. Pt completes seated ball toss with dropping 50 % of time. Pt beginning to get restless and more confused requring redirection to stop removing covers and education/reorientation on purpose of rehab. Pt perseverates on call light and redirected to rest and watch tv. Exited session with pt seated in bed, exit alarm on and call light in reach   Therapy Documentation Precautions:  Precautions Precautions: Fall Precaution Comments: incontinence, abdominal  wound (from PEG tube) Restrictions Weight Bearing Restrictions: No Other Position/Activity Restrictions: posey waist restraint, all bed rails General:   Vital Signs: Therapy Vitals Temp: 98.4 F (36.9 C) Temp Source: Oral Pulse Rate: 78 Resp: 18 BP: 110/69 Patient Position (if appropriate): Lying Oxygen Therapy SpO2: 100 % O2 Device: Room Air Pain:   Agitated Behavior Scale: TBI Observation Details Observation Environment: pt room Start of observation period - Date: 01/04/21 Start of observation period - Time: 1430 End of observation period - Date: 01/04/21 End of observation period - Time: 1530 Agitated Behavior Scale (DO NOT LEAVE BLANKS) Short attention span, easy distractibility, inability to concentrate: Present to a moderate degree Impulsive, impatient, low tolerance for pain or frustration: Present to a moderate degree Uncooperative, resistant to care, demanding: Present to a slight degree Violent and/or threatening violence toward people or property: Absent Explosive and/or unpredictable anger: Absent Rocking, rubbing, moaning, or other self-stimulating behavior: Absent Pulling at tubes, restraints, etc.: Absent Wandering from treatment areas: Present to a slight degree Restlessness, pacing, excessive movement: Present to a slight degree Repetitive behaviors, motor, and/or verbal: Absent Rapid, loud, or excessive talking: Absent Sudden changes of mood: Present to a slight degree Easily initiated or excessive crying and/or laughter: Absent Self-abusiveness, physical and/or verbal: Absent Agitated behavior scale total score: 22   Therapy/Group: Individual Therapy  Tonny Branch 01/04/2021, 4:44 PM

## 2021-01-04 NOTE — Progress Notes (Signed)
PROGRESS NOTE   Subjective/Complaints: Pt slower this morning, more processing delays. She seems to realize it. Was able to feed herself cereal in bed  ROS: Limited due to cognitive/behavioral    Objective:   No results found. Recent Labs    01/03/21 0421  WBC 6.0  HGB 9.9*  HCT 32.0*  PLT 287   Recent Labs    01/03/21 0421  NA 135  K 3.8  CL 101  CO2 25  GLUCOSE 157*  BUN 12  CREATININE 0.77  CALCIUM 8.9    Intake/Output Summary (Last 24 hours) at 01/04/2021 0841 Last data filed at 01/03/2021 1953 Gross per 24 hour  Intake 50 ml  Output --  Net 50 ml        Physical Exam: Vital Signs Blood pressure 116/90, pulse 83, temperature 98.1 F (36.7 C), temperature source Oral, resp. rate 18, height 5' 6"  (1.676 m), weight 96.8 kg, SpO2 100 %.  Constitutional: No distress . Vital signs reviewed. HEENT: NCAT, EOMI, oral membranes moist Neck: supple Cardiovascular: RRR without murmur. No JVD    Respiratory/Chest: CTA Bilaterally without wheezes or rales. Normal effort    GI/Abdomen: BS +, non-tender, non-distended Ext: no clubbing, cyanosis, or edema Psych: flat Skin: Clean and intact without signs of breakdown Neuro:  Pt alert. Delayed processing, does follow commands. Feeding self. Poor insight and awareness. Moves all 4's, normal sensation Musculoskeletal: Full ROM, No pain with AROM or PROM in the neck, trunk, or extremities.       Assessment/Plan: 1. Functional deficits which require 3+ hours per day of interdisciplinary therapy in a comprehensive inpatient rehab setting. Physiatrist is providing close team supervision and 24 hour management of active medical problems listed below. Physiatrist and rehab team continue to assess barriers to discharge/monitor patient progress toward functional and medical goals  Care Tool:  Bathing    Body parts bathed by patient: Right arm, Chest, Left arm, Abdomen,  Right upper leg, Left upper leg, Face, Right lower leg, Left lower leg, Front perineal area   Body parts bathed by helper: Buttocks     Bathing assist Assist Level: Moderate Assistance - Patient 50 - 74%     Upper Body Dressing/Undressing Upper body dressing   What is the patient wearing?: Pull over shirt    Upper body assist Assist Level: Moderate Assistance - Patient 50 - 74%    Lower Body Dressing/Undressing Lower body dressing      What is the patient wearing?: Incontinence brief     Lower body assist Assist for lower body dressing: Maximal Assistance - Patient 25 - 49%     Toileting Toileting    Toileting assist Assist for toileting: Maximal Assistance - Patient 25 - 49%     Transfers Chair/bed transfer  Transfers assist     Chair/bed transfer assist level: Maximal Assistance - Patient 25 - 49%     Locomotion Ambulation   Ambulation assist      Assist level: Moderate Assistance - Patient 50 - 74% Assistive device: Hand held assist Max distance: 300'   Walk 10 feet activity   Assist     Assist level: Moderate Assistance - Patient -  50 - 74% Assistive device: Hand held assist   Walk 50 feet activity   Assist    Assist level: Moderate Assistance - Patient - 50 - 74% Assistive device: Hand held assist    Walk 150 feet activity   Assist    Assist level: Moderate Assistance - Patient - 50 - 74% Assistive device: Hand held assist    Walk 10 feet on uneven surface  activity   Assist Walk 10 feet on uneven surfaces activity did not occur: Safety/medical concerns         Wheelchair     Assist Is the patient using a wheelchair?: No             Wheelchair 50 feet with 2 turns activity    Assist            Wheelchair 150 feet activity     Assist          Blood pressure 116/90, pulse 83, temperature 98.1 F (36.7 C), temperature source Oral, resp. rate 18, height 5' 6"  (1.676 m), weight 96.8 kg, SpO2  100 %.   Medical Problem List and Plan: 1.  Decline in ADL, mobility , and cognition secondary to anoxic brain injury             -patient may  shower             -ELOS/Goals: 18-21d  -Continue CIR therapies including PT, OT, and SLP   2.  Antithrombotics: -DVT/anticoagulation:  Pharmaceutical: Lovenox             -antiplatelet therapy: N/A 3. Pain Management: Tylenol prn.  4. Mood: LCSW to follow for evaluation and support.              -antipsychotic agents: Has been taking Depakote once a day on average due to refusal of meds             --  Risperdal scheduled bid as requiring Haldol/ativan at nights.    -9/21 will reduce risperdal to help with cognitive delay 5. Neuropsych: This patient is not capable of making decisions on her own behalf. 6. Skin/Wound Care:  Monitor abdominal wound for healing.             --routine pressure relief measures.   7. Fluids/Electrolytes/Nutrition: encourage PO .    -protein supp for low albumin  8. Cellulitis abdomen: On Keflex --through 9/27. Continue to monitor for healing.  9. T2DM: Hgb A1c-6.9. Continue insulin glargine ---transition to trulicity at discharge?             --CBG (last 3)  Recent Labs    01/03/21 2014 01/03/21 2108 01/04/21 0612  GLUCAP 161* 162* 128*    9/21 cover with SSI for now.  10. HTN: Monitor BP tid--continue Cozaar 11. SVT/NSVT: On coreg and amiodarone bid for rate control.   12. Dysphagia:   -continue D3, thins for now     LOS: 2 days A FACE TO FACE EVALUATION WAS PERFORMED  Meredith Staggers 01/04/2021, 8:41 AM

## 2021-01-04 NOTE — Progress Notes (Signed)
Physical Therapy Session Note  Patient Details  Name: Courtney Davies MRN: [id number] Date of Birth: [birth year]  Today's Date: 01/04/2021 PT Individual Time: 1005-1100 and 1300-1409 PT Individual Time Calculation (min): 55 min and 69 min  Short Term Goals: Week 1:  PT Short Term Goal 1 (Week 1): Pt will perform bed mobility with minA. PT Short Term Goal 2 (Week 1): Pt will perform bed to chair transfer with minA. PT Short Term Goal 3 (Week 1): Pt will ambulate x100' with minA. PT Short Term Goal 4 (Week 1): Pt will complete balance assessment.  Skilled Therapeutic Interventions/Progress Updates:     Pt received supine in bed with mother present, who is very upset about the fact that pt has been "sedated" during the day, and is discussing her concerns with RN. Pt is agreeable to therapy but appears very drowsy, with eyes open but very slow to respond to questions and cues. Pt performs supine to sit with maxA and cues for sequencing and positioning at EOB, with manual support at trunk and hips. Pt sitting with notable R lateral lean. PT assist pt to don pants, shirt, and shoes, while seated EOB. Pt requires maxA for dressing due to lack of initiation. Pt cued to stand to pull up pants and provides little to no initiation, instead sliding toward EOB. PT manually assists pt to scoot hips back onto bed and provides additional multimodal cueing on body mechanics and sequencing for sit to stand. Pt then able to perform with maxA at hips and trunk, and requiring totalA to pull up pants. Pt sits back down and then cued to stand for transfer to [facility]. Pt again provides little to no initiation and is unable to clear buttocks from bed. PT then performs heavy maxA squat pivot transfer to WC. WC transport to gym for time management. Pt performs stand pivot transfer to Nustep with maxA. Pt cued to perform Nustep but is only able to complete when PT provides near complete assistance. Pt continues to have R lateral  lean. Stand pivot back to [facility] with maxA and cues for anterior weight transfer and sequencing. Pt does not provide any initiation with transfer back to bed so PT again performs squat pivot with heavy maxA. MaxA for sit to supine. Left supine with alarm intact, posey belt intact, and all needs within reach.  2nd Session: Pt received seated in [facility] after toileting with RN. Handed off to PT and no complaint of pain. Pt not oriented to place or situation, but it agreeable to ambulating with PT.  WC transport to gym for time management. Pt performs sit to stand with minA, then ambulates >1000' during session, without AD and with modA due to occasional LOB, especially when externally distracted. Pt unaware of situation or surroundings, and PT spends much of session guiding pt around unit and preventing pt from entering other pt's rooms, as she attempt to do so often and becomes somewhat agitated when she is not able to enter. Pt moderately redirectable but quickly reverts to previous behavior. PT attempts to guide pt back to room but pt resistant and becomes more and more agitated. Eventually, PT and OT in hallway are able to convince pt to return to room, saying that her son is planning to meet her there. Pt requires modA +2 for sit to suipne. Left with alarm intact, posey belt intact, and all needs within reach.  Therapy Documentation Precautions:  Precautions Precautions: Fall Precaution Comments: incontinence, abdominal wound (from PEG tube)  Restrictions Weight Bearing Restrictions: No Other Position/Activity Restrictions: posey waist restraint, all bed rails   Therapy/Group: Individual Therapy  Breck Coons, PT, DPT 01/04/2021, 4:19 PM

## 2021-01-04 NOTE — Progress Notes (Signed)
Speech Language Pathology Daily Session Note  Patient Details  Name: Courtney Davies MRN: [id number] Date of Birth: 1976/02/09  Today's Date: 01/04/2021 SLP Individual Time: 0715-0810 SLP Individual Time Calculation (min): 55 min  Short Term Goals: Week 1: SLP Short Term Goal 1 (Week 1): Patient will demonstrate efficient mastication with complete oral clearance with trials of Dys. 2 textures over 2 sessions with Min verbal cues prior to upgrade. SLP Short Term Goal 2 (Week 1): Patient will utilize external memory aids for recall to place, date and situation with Mod verbal and visual cues. SLP Short Term Goal 3 (Week 1): Patient will demonstrate functional problem solving for basic and familiar tasks with Mod verbal cues. SLP Short Term Goal 4 (Week 1): Patient will demonstrate sustained attention to functional tasks for 15 minutes with Mod verbal cues for redirection. SLP Short Term Goal 5 (Week 1): Patient will utilize word-finding strategies at the sentence level with Mod multimodal cues.  Skilled Therapeutic Interventions: Skilled treatment session focused on cognitive and dysphagia goals. Upon arrival, patient was sitting EOB and attempting to use her call light as a phone. Patient with significant language of confusion today and was extremely indiscive. Patient would report she would have to use the bathroom and once SLP prepared everything for transfer, she would report she didn't need to and never said that. Patient required total A for orientation X 3. Eventually, patient agreeable to eating her breakfast but did not want anything on her tray. Patent consumed cereal with Mod verbal cues for initiation and attention with self-feeding without overt s/s of aspiration. However, patient required Max verbal cues to decrease impulsivity. Patient left upright in bed with alarm on, posey waistbelt in place and all needs within reach. Continue with current plan of care.   Pain No/Denies Pain    Therapy/Group: Individual Therapy  Solae Norling 01/04/2021, 12:25 PM

## 2021-01-04 NOTE — Plan of Care (Signed)
  Problem: Safety: Goal: Non-violent Restraint(s) Outcome: Progressing   Problem: Consults Goal: RH BRAIN INJURY PATIENT EDUCATION Description: Description: See Patient Education module for eduction specifics Outcome: Progressing Goal: Skin Care Protocol Initiated - if Braden Score 18 or less Description: If consults are not indicated, leave blank or document N/A Outcome: Progressing Goal: Diabetes Guidelines if Diabetic/Glucose > 140 Description: If diabetic or lab glucose is > 140 mg/dl - Initiate Diabetes/Hyperglycemia Guidelines & Document Interventions  Outcome: Progressing   Problem: RH BOWEL ELIMINATION Goal: RH STG MANAGE BOWEL WITH ASSISTANCE Description: STG Manage Bowel with Min Assistance. Outcome: Progressing   Problem: RH BLADDER ELIMINATION Goal: RH STG MANAGE BLADDER WITH ASSISTANCE Description: STG Manage Bladder With Min Assistance Outcome: Progressing   Problem: RH SKIN INTEGRITY Goal: RH STG ABLE TO PERFORM INCISION/WOUND CARE W/ASSISTANCE Description: STG Able To Perform Incision/Wound Care With [employer]. Outcome: Progressing   Problem: RH SAFETY Goal: RH STG ADHERE TO SAFETY PRECAUTIONS W/ASSISTANCE/DEVICE Description: STG Adhere to Safety Precautions With Min Assistance/Device. Outcome: Not Progressing   Problem: RH COGNITION-NURSING Goal: RH STG USES MEMORY AIDS/STRATEGIES W/ASSIST TO PROBLEM SOLVE Description: STG Uses Memory Aids/Strategies With Min Assistance to Problem Solve. Outcome: Progressing Goal: RH STG ANTICIPATES NEEDS/CALLS FOR ASSIST W/ASSIST/CUES Description: STG Anticipates Needs/Calls for Assist With Min Assistance/Cues. Outcome: Progressing   Problem: RH PAIN MANAGEMENT Goal: RH STG PAIN MANAGED AT OR BELOW PT'S PAIN GOAL Description: < 3 on a 0-10 pain scale. Outcome: Progressing   Problem: RH KNOWLEDGE DEFICIT BRAIN INJURY Goal: RH STG INCREASE KNOWLEDGE OF SELF CARE AFTER BRAIN INJURY Description: Patient will  demonstrate knowledge on medication management, pain management, skin/wound care with educational materials and handouts provided by staff independently at discharge. Outcome: Progressing

## 2021-01-04 NOTE — Progress Notes (Signed)
Came to visit patient at request of family.  Had met them during admission to department Courtney Davies and patient sister Courtney Davies called me reporting her mother was visiting the patient in rehab and they needed support.  Courtney Davies shared that her mother had concerns with patient being over sedated and not able to participate in rehab therapy.    In meeting with patient mother Courtney Davies and talking with Courtney Davies over the phone I confirmed that Courtney Davies should be the primary contact person for this patient. Courtney Davies and Courtney Davies are making Courtney Davies and Courtney Davies aware of this change.  Courtney Davies will be the primary care taker at discharge and she wants Courtney Davies to be the primary contact for the social work and medical team to keep the family up to date.   In addition, I reviewed medications that are ordered that can cause sedation currently ordered for the patient with the family. I made Courtney Davies and Courtney Davies aware the CIR team had already made changes today to decrease the risperidone from 74m twice a day to 125mtwice a day and this should help with some of the sedation  they are seeing.    Family was thankful for my visit and intervention to support them.    ChRichardean CanalN, BSN, CCRN-K

## 2021-01-04 NOTE — Progress Notes (Signed)
Patient ID: Courtney Davies, female   DOB: [birth year], 45 y.o.   MRN: [id number]  SW spoke with pt sister [name] [phone]) to inform on statement of service and ELOS being 3 weeks.  SW met with pt and pt mother in room to discuss above as well. SW informed will f/u once there is more information.   [name], MSW, [place] Office: [phone] Cell: [phone] Fax: [phone]

## 2021-01-04 NOTE — Care Management (Signed)
[facility] Individual Statement of Services  Patient Name:  Courtney Davies  Date:  01/04/2021  Welcome to the [facility].  Our goal is to provide you with an individualized program based on your diagnosis and situation, designed to meet your specific needs.  With this comprehensive rehabilitation program, you will be expected to participate in at least 3 hours of rehabilitation therapies Monday-Friday, with modified therapy programming on the weekends.  Your rehabilitation program will include the following services:  Physical Therapy (PT), Occupational Therapy (OT), Speech Therapy (ST), 24 hour per day rehabilitation nursing, Therapeutic Recreaction (TR), Psychology, Neuropsychology, Care Coordinator, Rehabilitation Medicine, [place], and Other  Weekly team conferences will be held on Tuesdays to discuss your progress.  Your Inpatient Rehabilitation Care Coordinator will talk with you frequently to get your input and to update you on team discussions.  Team conferences with you and your family in attendance may also be held.  Expected length of stay: 3 weeks    Overall anticipated outcome: Supervision  Depending on your progress and recovery, your program may change. Your Inpatient Rehabilitation Care Coordinator will coordinate services and will keep you informed of any changes. Your Inpatient Rehabilitation Care Coordinator's name and contact numbers are listed  below.  The following services may also be recommended but are not provided by the [facility] will be made to provide these services after discharge if needed.  Arrangements include referral to agencies that provide these services.  Your insurance has been verified to be:  [place] [employer] [facility]  Your primary  doctor is:  No PCP  Pertinent information will be shared with your doctor and your insurance company.  Inpatient Rehabilitation Care Coordinator:  Cathleen Corti [phone] or ([phone]  Information discussed with and copy given to patient by: Rana Snare, 01/04/2021, 10:25 AM

## 2021-01-05 LAB — VALPROIC ACID LEVEL: Valproic Acid Lvl: 20 ug/mL — ABNORMAL LOW (ref 50.0–100.0)

## 2021-01-05 LAB — GLUCOSE, CAPILLARY
Glucose-Capillary: 130 mg/dL — ABNORMAL HIGH (ref 70–99)
Glucose-Capillary: 153 mg/dL — ABNORMAL HIGH (ref 70–99)
Glucose-Capillary: 224 mg/dL — ABNORMAL HIGH (ref 70–99)
Glucose-Capillary: 109 mg/dL — ABNORMAL HIGH (ref 70–99)

## 2021-01-05 MED ORDER — LORAZEPAM 2 MG/ML IJ SOLN
0.5000 mg | Freq: Once | INTRAMUSCULAR | Status: AC
  Administered 2021-01-05: 0.5 mg via INTRAMUSCULAR
  Filled 2021-01-05: qty 1

## 2021-01-05 MED ORDER — RISPERIDONE 0.5 MG PO TABS
0.5000 mg | ORAL_TABLET | Freq: Two times a day (BID) | ORAL | Status: DC
  Administered 2021-01-06 – 2021-01-11 (×11): 0.5 mg via ORAL
  Filled 2021-01-05 (×11): qty 1
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
## 2021-01-05 NOTE — Progress Notes (Signed)
PROGRESS NOTE   Subjective/Complaints: Pt perhaps a little brighter today. No major behavioral issues noted  ROS: Limited due to cognitive/behavioral    Objective:   No results found. Recent Labs    01/03/21 0421  WBC 6.0  HGB 9.9*  HCT 32.0*  PLT 287   Recent Labs    01/03/21 0421  NA 135  K 3.8  CL 101  CO2 25  GLUCOSE 157*  BUN 12  CREATININE 0.77  CALCIUM 8.9    Intake/Output Summary (Last 24 hours) at 01/05/2021 1057 Last data filed at 01/05/2021 0840 Gross per 24 hour  Intake 562 ml  Output --  Net 562 ml        Physical Exam: Vital Signs Blood pressure 90/69, pulse 78, temperature (!) 97.5 F (36.4 C), temperature source Oral, resp. rate 18, height 5' 6"  (1.676 m), weight 96.9 kg, SpO2 99 %.  Constitutional: No distress . Vital signs reviewed. HEENT: NCAT, EOMI, oral membranes moist Neck: supple Cardiovascular: RRR without murmur. No JVD    Respiratory/Chest: CTA Bilaterally without wheezes or rales. Normal effort    GI/Abdomen: BS +, non-tender, non-distended Ext: no clubbing, cyanosis, or edema Psych: pleasant and cooperative  Skin: Clean and intact without signs of breakdown Neuro:  Pt alert.still a little slowed cognitively.  Moves all 4's, normal sensation Musculoskeletal: Full ROM, No pain with AROM or PROM in the neck, trunk, or extremities.       Assessment/Plan: 1. Functional deficits which require 3+ hours per day of interdisciplinary therapy in a comprehensive inpatient rehab setting. Physiatrist is providing close team supervision and 24 hour management of active medical problems listed below. Physiatrist and rehab team continue to assess barriers to discharge/monitor patient progress toward functional and medical goals  Care Tool:  Bathing    Body parts bathed by patient: Right arm   Body parts bathed by helper: Buttocks     Bathing assist Assist Level: Moderate  Assistance - Patient 50 - 74%     Upper Body Dressing/Undressing Upper body dressing   What is the patient wearing?: Pull over shirt    Upper body assist Assist Level: Moderate Assistance - Patient 50 - 74%    Lower Body Dressing/Undressing Lower body dressing      What is the patient wearing?: Incontinence brief     Lower body assist Assist for lower body dressing: Maximal Assistance - Patient 25 - 49%     Toileting Toileting    Toileting assist Assist for toileting: Maximal Assistance - Patient 25 - 49%     Transfers Chair/bed transfer  Transfers assist     Chair/bed transfer assist level: Minimal Assistance - Patient > 75%     Locomotion Ambulation   Ambulation assist      Assist level: Moderate Assistance - Patient 50 - 74% Assistive device: Hand held assist Max distance: 300'   Walk 10 feet activity   Assist     Assist level: Moderate Assistance - Patient - 50 - 74% Assistive device: Hand held assist   Walk 50 feet activity   Assist    Assist level: Moderate Assistance - Patient - 50 - 74% Assistive device:  Hand held assist    Walk 150 feet activity   Assist    Assist level: Moderate Assistance - Patient - 50 - 74% Assistive device: Hand held assist    Walk 10 feet on uneven surface  activity   Assist Walk 10 feet on uneven surfaces activity did not occur: Safety/medical concerns         Wheelchair     Assist Is the patient using a wheelchair?: No             Wheelchair 50 feet with 2 turns activity    Assist            Wheelchair 150 feet activity     Assist          Blood pressure 90/69, pulse 78, temperature (!) 97.5 F (36.4 C), temperature source Oral, resp. rate 18, height 5' 6"  (1.676 m), weight 96.9 kg, SpO2 99 %.   Medical Problem List and Plan: 1.  Decline in ADL, mobility , and cognition secondary to anoxic brain injury             -patient may  shower             -ELOS/Goals:  18-21d  -Continue CIR therapies including PT, OT, and SLP    2.  Antithrombotics: -DVT/anticoagulation:  Pharmaceutical: Lovenox             -antiplatelet therapy: N/A 3. Pain Management: Tylenol prn.  4. Mood: LCSW to follow for evaluation and support.              -antipsychotic agents: Has been taking Depakote once a day on average due to refusal of meds             --  Risperdal scheduled bid as requiring Haldol/ativan at nights.    -9/22 observed with reduced risperdal--plan on completely weaning off, decrease further tomorrow morning 5. Neuropsych: This patient is not capable of making decisions on her own behalf. 6. Skin/Wound Care:  Monitor abdominal wound for healing.             --routine pressure relief measures.   7. Fluids/Electrolytes/Nutrition: encourage PO .    -protein supp for low albumin  8. Cellulitis abdomen: On Keflex --through 9/27. Continue to monitor for healing.  9. T2DM: Hgb A1c-6.9. Continue insulin glargine ---transition to trulicity at discharge?             --CBG (last 3)  Recent Labs    01/04/21 1657 01/04/21 2054 01/05/21 0613  GLUCAP 143* 221* 130*    9/22 cover with SSi again today 10. HTN: Monitor BP tid--continue Cozaar 11. SVT/NSVT: On coreg and amiodarone bid for rate control.   12. Dysphagia:   -continue D3, thins for now     LOS: 3 days A FACE TO FACE EVALUATION WAS PERFORMED  Meredith Staggers 01/05/2021, 10:57 AM

## 2021-01-05 NOTE — Progress Notes (Signed)
Speech Language Pathology Daily Session Note  Patient Details  Name: Courtney Davies MRN: [id number] Date of Birth: April 17, 1975  Today's Date: 01/05/2021 SLP Individual Time: 1345-1430 SLP Individual Time Calculation (min): 45 min  Short Term Goals: Week 1: SLP Short Term Goal 1 (Week 1): Patient will demonstrate efficient mastication with complete oral clearance with trials of Dys. 2 textures over 2 sessions with Min verbal cues prior to upgrade. SLP Short Term Goal 2 (Week 1): Patient will utilize external memory aids for recall to place, date and situation with Mod verbal and visual cues. SLP Short Term Goal 3 (Week 1): Patient will demonstrate functional problem solving for basic and familiar tasks with Mod verbal cues. SLP Short Term Goal 4 (Week 1): Patient will demonstrate sustained attention to functional tasks for 15 minutes with Mod verbal cues for redirection. SLP Short Term Goal 5 (Week 1): Patient will utilize word-finding strategies at the sentence level with Mod multimodal cues.  Skilled Therapeutic Interventions: Skilled treatment session focused on dysphagia and cognitive goals. Upon arrival, patient's cousin present. SLP transferred the patient to her wheelchair from the bed with +2 Max A needed due to patient demonstrating difficulty leaning forward and was constantly scooting to the edge of bed instead. SLP facilitated session by providing trials of regular textures. Patient demonstrated efficient mastication but required Min verbal cues for a slow rate of self-feeding and for attention to task. No overt s/s of aspiration. Recommend patient continue current diet and a trial tray of regular textures prior to upgrade. SLP also facilitated session by providing a basic money management task. Patient sorted coins with Mod I but required Max A verbal cues to count money and generate specific amounts of change. Patient with minimal awareness of difficulty with task. Patient handed off to  OT. Continue with current plan of care.      Pain No/Denies Pain   Therapy/Group: Individual Therapy  Larrisa Cravey 01/05/2021, 3:32 PM

## 2021-01-05 NOTE — Progress Notes (Signed)
Patient ID: Courtney Davies, female   DOB: 1975-04-27, 45 y.o.   MRN: [id number]   SW faxed PCS referral to [facility] [phone]). If transportation services are needed. Pt will need to contact [employer] [phone].  [name], MSW, [place] Office: [phone] Cell: [phone] Fax: [phone]

## 2021-01-05 NOTE — Progress Notes (Signed)
Occupational Therapy Session Note  Patient Details  Name: Courtney Davies MRN: [id number] Date of Birth: 05/25/1975  Today's Date: 01/05/2021 Session 1 OT Individual Time: 8466-5993 OT Individual Time Calculation (min): 57 min   Session 2 OT Individual Time: 1430-1500 OT Individual Time Calculation (min): 30 min    Short Term Goals: Week 1:  OT Short Term Goal 1 (Week 1): Pt will be A&Ox4 with external cues PRN OT Short Term Goal 2 (Week 1): Pt will participate in ADLs 3 days in a row without agitation OT Short Term Goal 3 (Week 1): Pt will complete bed mobility with close (S) OT Short Term Goal 4 (Week 1): Pt will bathe UB with min cuing for attention to task  Skilled Therapeutic Interventions/Progress Updates:   Session 1 Pt greeted semi-reclined in bed with brief off and blanket around her head. Pt disoriented to time, place and situation. Pt with RNB music playing which pt was singing to. Pt able to follow commands to assist with washing peri-area, then needed max multimodal cues to initiate turning in bed to don clean brief. Pt came to sitting EOB with mod A and max cues. Pt continued to scoot towards EOB with very poor spatial and body awareness max cues and manual facilitation to scoot back on bed. Mod A sit<>stand then max A to pull up pants. Pt then able to pivot to wc with max A and anterior LOB leaning forward. Pt brought to the sink and needed max multimodal cues to locate water faucet and initiate bathing tasks.Vebral cues for initiation and sequencing of toothbrushing task as well. OT handed pt her shirt and she wrapped it around her head like a towel. OT assist to get shirt overhead, then pt able to initiate putting arms in shirt. Pt brought down to therapy gym and used BITS to work on memory of 2 words/images. Pt needed OT assist to read words, then was able to repeat in order, but unable to replicate order once images popped up. Pt becoming increasingly agitated and wanted to  leave and look for her "homegirls." OT unable to get pt to go back to bed as pt perseverating on finding her "home girls." Pt left seated in nurses station with alarm belt on and needs met.  Pain:  Denies pain  Session 2  Pt greeted handoff from SLP for OT treatment session. Pt brought to therapy dayroom and addressed sit<>stand and standing endurance within cognitive task. Pt scooting towards edge of chair instead of powering up requiring manual facilitation to safely scoot back in chair. Pt with EXTREMELY poor safety and body awareness. Pt eventually able to safely power up with mod A. Pt tolerated stanidng while participating in peg board activity replicating 3-4 color patterns. Pt 75% accurate with 3 colors and 50% with 4 colors. OT asked pt if she needed to use the bathroom and pt stated yes. Similar situation with trying to stand to go to the bathroom, so OT used RW to help facilitate and cue to power up and safe weight shift, pt was able to stand with min A pulling up on RW. Max cues to direct rw and mod A to ambulate into bathroom. Pt doffed pants in stanidng with mod A for balance, then needded cues to sit on commode. Pt voided bladder and completed peri-care with set-up A. Mod A sit<>stand with grab bars and RW, then mod A to ambulate back out of bathroom w/ max facilitation at RW. Pt returned to bed  and left sem-reclined in bed with alarm belt on, call bell in reach, bed alarm on, and needs met.   Therapy Documentation Precautions:  Precautions Precautions: Fall Precaution Comments: incontinence, abdominal wound (from PEG tube) Restrictions Weight Bearing Restrictions: No Other Position/Activity Restrictions: posey waist restraint, all bed rails Pain:  Denies pain  Therapy/Group: Individual Therapy  Valma Cava 01/05/2021, 3:30 PM

## 2021-01-05 NOTE — Progress Notes (Signed)
This nurse assessed increased bouts of agitation unable to break the agitation with verbal conflict deescalating techniques. PA contacted for extreme agitation New orders for Enclosure bed restraints and 0.5 mg IM ativan x1 given oncoming nurse notified of changes.

## 2021-01-05 NOTE — Progress Notes (Signed)
Occupational Therapy Session Note  Patient Details  Name: Courtney Davies MRN: [id number] Date of Birth: July 14, 1975  Today's Date: 01/06/2021 OT Individual Time: 5465-6812 OT Individual Time Calculation (min): 42 min    Short Term Goals: Week 1:  OT Short Term Goal 1 (Week 1): Pt will be A&Ox4 with external cues PRN OT Short Term Goal 2 (Week 1): Pt will participate in ADLs 3 days in a row without agitation OT Short Term Goal 3 (Week 1): Pt will complete bed mobility with close (S) OT Short Term Goal 4 (Week 1): Pt will bathe UB with min cuing for attention to task  Skilled Therapeutic Interventions/Progress Updates:    Pt greeted in bed, stated she already completed ADLs this morning. Asked if I'd check on breakfast for her. Per RN, pt already ate with SLP. Pt wanted to get up to make her sister some breakfast. Setup for supine<sit and then pt impulsively stood up. She ambulated with CGA-Min A and no AD down one end of the [place] hallway and then returned, noted some anterior LOBs. SW provided close w/c follow for safety at this time. Pt disoriented to place, situation, and time. Even with cuing, pt unable to retain orientation information. While standing in the room, pt conversed with 1 relative over the phone and then looked at photos on her wall with her visitor (father of her 2 sons). Pt unable to consistently identify herself in photos, adamant that her sons were both still in school when pts visitor verified that they have both already graduated. Pt with decreased insight into deficits, believing her visitor was providing inaccurate information vs recognizing her significant memory impairment. Pt returned to the enclosure bed at close of session, requested 4 blankets and these were provided to her. Pt was secured in the enclosure bed for safety, comfortable with phone and call bell within reach, thanked OT for coming today. Tx focus placed on dynamic balance, functional cognition, activity  tolerance, and standing endurance.   Therapy Documentation Precautions:  Precautions Precautions: Fall Precaution Comments: incontinence, abdominal wound (from PEG tube) Restrictions Weight Bearing Restrictions: No Other Position/Activity Restrictions: posey waist restraint, all bed rails  Pain: in the Lt knee. Per RN, pt premedicated Pain Assessment Faces Pain Scale: Hurts a little bit ADL: ADL Grooming: Minimal assistance, Maximal cueing Where Assessed-Grooming: Edge of bed Upper Body Bathing: Minimal assistance, Maximal cueing Where Assessed-Upper Body Bathing: Edge of bed Lower Body Bathing: Moderate assistance, Maximal cueing Where Assessed-Lower Body Bathing: Edge of bed Upper Body Dressing: Minimal cueing, Maximal cueing Where Assessed-Upper Body Dressing: Edge of bed Lower Body Dressing: Moderate assistance, Maximal assistance Where Assessed-Lower Body Dressing: Edge of bed ADL Comments: could not assess due to pt agitation. pt refused any ADLs.     Therapy/Group: Individual Therapy  Jayah Balthazar A Evona Westra 01/06/2021, 12:40 PM

## 2021-01-05 NOTE — Progress Notes (Signed)
This nurse was walking past patient room and found patient on all fours attempting to crawl over bed rails. Patient was instructed to lay down and stop climbing out of bed tele sitter was notifying staff about patient getting out of bed as this nurse entered room. Patient combative to staff and paranoid about people " robbing my house and taking all of my GD stuff." Code bluebird was called and staff responded to deescalate situation by using therapeutic hold to deescalate situation and end combative episode to staff. PA on floor and notified of combative episode. Verbal orders received to administer PM dose of Risperdal now

## 2021-01-05 NOTE — Progress Notes (Signed)
Physical Therapy Session Note  Patient Details  Name: Courtney Davies MRN: [id number] Date of Birth: 06-Oct-1975  Today's Date: 01/05/2021 PT Individual Time: 5974-1638 PT Individual Time Calculation (min): 35 min   Short Term Goals: Week 1:  PT Short Term Goal 1 (Week 1): Pt will perform bed mobility with minA. PT Short Term Goal 2 (Week 1): Pt will perform bed to chair transfer with minA. PT Short Term Goal 3 (Week 1): Pt will ambulate x100' with minA. PT Short Term Goal 4 (Week 1): Pt will complete balance assessment.  Skilled Therapeutic Interventions/Progress Updates:     Patient in w/c at the nurses station upon PT arrival. Patient alert and agreeable to PT session, requesting to return to her room. Patient denied pain during session.  Patient requested something to eat, offered available snacks on the unit, patient declined, stating, "I want meat." Offered to have patient ambulate to the nurses station to discuss options of ordering a tray from the cafeteria, patient in agreement. Patient performed multiple attempts at standing with different techniques patient unable to initiate standing due to motor planning versus behavioral deficits. Performed squat pivot w/c>bed with max-total A of 1 then +2 to complete transfer due to w/c sliding away and patient landing with only one ischial tuberosity on the bed and PT supporting patient with total A to prevent fall until +2 arrived to complete transfer. Patient upset by the event and demonstrates no awareness of deficits. Patient calmed by therapeutic use of self with calm demeanor and plan to discuss transfers with lead OT and obtaining food with RN. Grayland Ormond, OT, reports that patient does fluctuate with mobility based on behavior or fatigue and does demonstrate motor planning deficits. RN to follow-up with patient on ordering a second tray from the cafeteria.   Inspected w/c and noted that breaks were too loose allowing the wheel to spin when  fully locked. Retrieved Roho cushion for improved sitting tolerance and skin integrity and hygiene due to incontinence. Upon return, patient asleep in the bed, ended session at this time due to patient fatigue. Patient missed 25 min of skilled PT due to fatigue, RN made aware. Will attempt to make-up missed time as able.    Adjusted and secured w/c breaks on both sides to ensure patient safety and prevent rolling during transfers following session.   Patient in bed asleep at end of session with breaks locked, Posey waist belt and bed alarm set, Telesitter in place, and all needs within reach.   Therapy Documentation Precautions:  Precautions Precautions: Fall Precaution Comments: incontinence, abdominal wound (from PEG tube) Restrictions Weight Bearing Restrictions: No Other Position/Activity Restrictions: posey waist restraint, all bed rails General: PT Amount of Missed Time (min): 25 Minutes PT Missed Treatment Reason: Patient fatigue    Therapy/Group: Individual Therapy  Federica Allport L Suleyman Ehrman PT, DPT  01/05/2021, 4:08 PM

## 2021-01-05 NOTE — Progress Notes (Signed)
Palliative Medicine RN Note: Pt's sister Gorden Harms [phone]) called our office and asked that we meet again with them now that pt is at CIR. I have sent a secure chat to Dr Naaman Plummer and SW Auria to let them know and see if they are ok with [place] re-joining her team.  Marjie Skiff. Chanz Cahall, RN, BSN, [facility] Palliative Medicine Team 01/05/2021 4:46 PM Office [phone]

## 2021-01-05 NOTE — IPOC Note (Signed)
Overall Plan of Care [facility]) Patient Details Name: Courtney Davies MRN: [id number] DOB: Jan 21, 1976  Admitting Diagnosis: Anoxic brain injury [facility])  Hospital Problems: Principal Problem:   Anoxic brain injury [facility])     Functional Problem List: Nursing Behavior, Bladder, Bowel, Endurance, Medication Management, Nutrition, Pain, Safety  PT Balance, Behavior, Endurance, Motor, Pain, Perception, Safety, Sensory  OT Balance, Behavior, Cognition, Endurance, Motor, Nutrition, Pain, Perception, Safety, Sensory, Vision  SLP Cognition, Nutrition, Linguistic  TR         Basic ADL's: OT Eating, Grooming, Bathing, Dressing, Toileting     Advanced  ADL's: OT Simple Meal Preparation, Full Meal Preparation, Laundry, Light Housekeeping     Transfers: PT Bed Mobility, Bed to Chair, Car, Patent attorney, Agricultural engineer: PT Ambulation, Stairs     Additional Impairments: OT    SLP Swallowing, Social Cognition, Communication expression Problem Solving, Memory, Attention, Awareness  TR      Anticipated Outcomes Item Anticipated Outcome  Self Feeding    Swallowing  Mod I   Basic self-care     Toileting      Bathroom Transfers    Bowel/Bladder  min assist  Transfers  Supervision  Locomotion  Supervision  Communication  Supervision  Cognition  Min A  Pain  <3  Safety/Judgment  min assist with no falls   Therapy Plan: PT Intensity: Minimum of 1-2 x/day ,45 to 90 minutes PT Frequency: 5 out of 7 days PT Duration Estimated Length of Stay: 3 weeks OT Intensity: Minimum of 1-2 x/day, 45 to 90 minutes OT Frequency: 5 out of 7 days SLP Intensity: Minumum of 1-2 x/day, 30 to 90 minutes SLP Frequency: 3 to 5 out of 7 days SLP Duration/Estimated Length of Stay: 3 weeks   Due to the current state of emergency, patients may not be receiving their 3-hours of Medicare-mandated therapy.   Team Interventions: Nursing Interventions Patient/Family Education, Bladder  Management, Bowel Management, Disease Management/Prevention, Pain Management, Medication Management, Cognitive Remediation/Compensation, Discharge Planning  PT interventions Ambulation/gait training, Community reintegration, DME/adaptive equipment instruction, Psychosocial support, Neuromuscular re-education, Stair training, UE/LE Strength taining/ROM, Wheelchair propulsion/positioning, Training and development officer, Functional electrical stimulation, Discharge planning, Pain management, Skin care/wound management, Therapeutic Activities, UE/LE Coordination activities, Cognitive remediation/compensation, Disease management/prevention, Functional mobility training, Patient/family education, Splinting/orthotics, Therapeutic Exercise, Visual/perceptual remediation/compensation  OT Interventions Balance/vestibular training, Cognitive remediation/compensation, Community reintegration, Discharge planning, Disease mangement/prevention, DME/adaptive equipment instruction, Functional mobility training, Pain management, Patient/family education, Psychosocial support, Self Care/advanced ADL retraining, Skin care/wound managment, Therapeutic Activities, Therapeutic Exercise, UE/LE Strength taining/ROM, UE/LE Coordination activities, Visual/perceptual remediation/compensation  SLP Interventions Cognitive remediation/compensation, Dysphagia/aspiration precaution training, Internal/external aids, Cueing hierarchy, Environmental controls, Therapeutic Activities, Functional tasks, Patient/family education, Speech/Language facilitation  TR Interventions    SW/CM Interventions Discharge Planning, Psychosocial Support, Patient/Family Education   Barriers to Discharge MD  Medical stability  Nursing Decreased caregiver support, Home environment access/layout, Incontinence, Lack of/limited family support, Hemodialysis, Medication compliance, Behavior, Nutrition means Discharging to mom's home. Sister and mom anticipated caregivers  and can provide min assist.  PT      OT Home environment access/layout, Incontinence, Behavior, Wound Care    SLP Decreased caregiver support, Lack of/limited family support, Behavior    SW Insurance for SNF coverage     Team Discharge Planning: Destination: PT-Home ,OT-   , SLP-Home Projected Follow-up: PT-24 hour supervision/assistance, Outpatient PT, OT-  24 hour supervision/assistance, SLP-Outpatient SLP, 24 hour supervision/assistance Projected Equipment Needs: PT-To be determined, OT-  , SLP-None recommended by  SLP Equipment Details: PT- , OT-  Patient/family involved in discharge planning: PT- Patient unable/family or caregiver not available,  OT- , SLP-Patient  MD ELOS: 18-21 days Medical Rehab Prognosis:  Good Assessment: The patient has been admitted for CIR therapies with 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 The team will be addressing functional mobility, strength, stamina, balance, safety, adaptive techniques and equipment, self-care, bowel and bladder mgt, patient and caregiver education, NMR, cognition, communication, swallowing, behavior mgt. Goals have been set at supervision to min assist with self-care and mobility to min assist with cognition.   Due to the current state of emergency, patients may not be receiving their 3 hours per day of Medicare-mandated therapy.    Meredith Staggers, MD, FAAPMR     See Team Conference Notes for weekly updates to the plan of care

## 2021-01-06 DIAGNOSIS — Z7189 Other specified counseling: Secondary | ICD-10-CM

## 2021-01-06 DIAGNOSIS — Z515 Encounter for palliative care: Secondary | ICD-10-CM

## 2021-01-06 LAB — GLUCOSE, CAPILLARY
Glucose-Capillary: 185 mg/dL — ABNORMAL HIGH (ref 70–99)
Glucose-Capillary: 156 mg/dL — ABNORMAL HIGH (ref 70–99)
Glucose-Capillary: 224 mg/dL — ABNORMAL HIGH (ref 70–99)

## 2021-01-06 MED ORDER — INSULIN GLARGINE-YFGN 100 UNIT/ML ~~LOC~~ SOLN
10.0000 [IU] | Freq: Every day | SUBCUTANEOUS | Status: DC
  Administered 2021-01-06 – 2021-01-23 (×18): 10 [IU] via SUBCUTANEOUS
  Filled 2021-01-06 (×20): qty 0.1

## 2021-01-06 MED ORDER — INSULIN GLARGINE-YFGN 100 UNIT/ML ~~LOC~~ SOLN
12.0000 [IU] | Freq: Every day | SUBCUTANEOUS | Status: DC
  Administered 2021-01-07 – 2021-01-09 (×3): 12 [IU] via SUBCUTANEOUS
  Filled 2021-01-06 (×3): qty 0.12

## 2021-01-06 NOTE — Progress Notes (Signed)
Occupational Therapy BI Note  Patient Details  Name: Courtney Davies MRN: [id number] Date of Birth: [birth year]  Today's Date: 01/06/2021 OT Individual Time: 1345-1445 OT Individual Time Calculation (min): 60 min  and Today's Date: 01/06/2021 OT Missed Time: 15 Minutes Missed Time Reason: Other (comment) (agitated)   Short Term Goals: Week 1:  OT Short Term Goal 1 (Week 1): Pt will be A&Ox4 with external cues PRN OT Short Term Goal 2 (Week 1): Pt will participate in ADLs 3 days in a row without agitation OT Short Term Goal 3 (Week 1): Pt will complete bed mobility with close (S) OT Short Term Goal 4 (Week 1): Pt will bathe UB with min cuing for attention to task  Skilled Therapeutic Interventions/Progress Updates:     Pt received in enclosure bed. No pain reported but stating she had itching in peri area. Mild skin breakdown noted. RN alerted, powder applied. OT retrieves TIS for improves safety with sitting up when appropriate for tilt feature as pt has poor proprioception.  ADL: Pt completes ADL at overall  MOD A for UB ADL Level. Skilled interventions include:cuing for initiation and orientation of shirt. Pt able to pull B arms out but demo poor continuation as pt stops doffing shift prior to picking up new shirt. Old shirt put in drawer underneath closet in corner of room by window. Pt completes 2 transfers with RW and MOD of 1- MAX of 2 varying levels of initiation impacting performance. Pt unaware of body positioning at one point seated in TIS stating, "Im standing, I dont know what else you want me to do." Pt eats pudding cup at high low table in dayroom for change of scenery. Attempted pt functional mobility back to room, however pt unsafe at [facility]. Pt completes folding blankets in room to tidy up. With increased time and VC for positioning of hands on blanket d/t impaired visual perceptual skills. Pt clearly dmeoing increased agitation with mobility as pt unable ot motor plan. Redirected pt  when getting agitated to a bad joke, and pt chuckled and able to refocus on mobiltiy. Return to bed as stated above.    Pt left at end of session in enclosure bed zipped, call light in reach and all needs met   Therapy Documentation Precautions:  Precautions Precautions: Fall Precaution Comments: incontinence, abdominal wound (from PEG tube) Restrictions Weight Bearing Restrictions: No Other Position/Activity Restrictions: posey waist restraint, all bed rails  Pain: Pain Assessment Pain Scale: 0-10 Pain Score: 0-No pain Agitated Behavior Scale: TBI  Observation Details Observation Environment: CIR Start of observation period - Date: 01/06/21 Start of observation period - Time: 1345 End of observation period - Date: 01/06/21 End of observation period - Time: 1445 Agitated Behavior Scale (DO NOT LEAVE BLANKS) Short attention span, easy distractibility, inability to concentrate: Present to a moderate degree Impulsive, impatient, low tolerance for pain or frustration: Present to a moderate degree Uncooperative, resistant to care, demanding: Present to a slight degree Violent and/or threatening violence toward people or property: Absent Explosive and/or unpredictable anger: Present to a slight degree Rocking, rubbing, moaning, or other self-stimulating behavior: Absent Pulling at tubes, restraints, etc.: Absent Wandering from treatment areas: Absent Restlessness, pacing, excessive movement: Present to a moderate degree Repetitive behaviors, motor, and/or verbal: Present to a slight degree Rapid, loud, or excessive talking: Present to a slight degree Sudden changes of mood: Present to a moderate degree Easily initiated or excessive crying and/or laughter: Absent Self-abusiveness, physical and/or verbal: Absent Agitated behavior  scale total score: 26    Therapy/Group: Individual Therapy  Tonny Branch 01/06/2021, 4:14 PM

## 2021-01-06 NOTE — Progress Notes (Addendum)
PROGRESS NOTE   Subjective/Complaints: Pt more alert. C/o pain in her left foot and leg, really both. Has a hard time bearing weight on legs d/t sensitivity.   ROS: Limited due to cognitive/behavioral    Objective:   No results found. No results for input(s): WBC, HGB, HCT, PLT in the last 72 hours.  No results for input(s): NA, K, CL, CO2, GLUCOSE, BUN, CREATININE, CALCIUM in the last 72 hours.   Intake/Output Summary (Last 24 hours) at 01/06/2021 1155 Last data filed at 01/06/2021 0800 Gross per 24 hour  Intake 480 ml  Output --  Net 480 ml        Physical Exam: Vital Signs Blood pressure 120/77, pulse 87, temperature 98.4 F (36.9 C), resp. rate 14, height 5' 6"  (1.676 m), weight 96.9 kg, SpO2 100 %.  Constitutional: No distress . Vital signs reviewed. HEENT: NCAT, EOMI, oral membranes moist Neck: supple Cardiovascular: RRR without murmur. No JVD    Respiratory/Chest: CTA Bilaterally without wheezes or rales. Normal effort    GI/Abdomen: BS +, non-tender, non-distended Ext: no clubbing, cyanosis, or edema Psych: pleasant and cooperative   Skin: Clean and intact without signs of breakdown Neuro:  Pt alert.still a little slowed cognitively.  Moves all 4's, normal sensation Musculoskeletal: Full ROM, No pain with AROM or PROM in the neck, trunk, or extremities.       Assessment/Plan: 1. Functional deficits which require 3+ hours per day of interdisciplinary therapy in a comprehensive inpatient rehab setting. Physiatrist is providing close team supervision and 24 hour management of active medical problems listed below. Physiatrist and rehab team continue to assess barriers to discharge/monitor patient progress toward functional and medical goals  Care Tool:  Bathing    Body parts bathed by patient: Right arm   Body parts bathed by helper: Buttocks     Bathing assist Assist Level: Moderate Assistance -  Patient 50 - 74%     Upper Body Dressing/Undressing Upper body dressing   What is the patient wearing?: Pull over shirt    Upper body assist Assist Level: Moderate Assistance - Patient 50 - 74%    Lower Body Dressing/Undressing Lower body dressing      What is the patient wearing?: Incontinence brief     Lower body assist Assist for lower body dressing: Maximal Assistance - Patient 25 - 49%     Toileting Toileting    Toileting assist Assist for toileting: 2 Helpers     Transfers Chair/bed transfer  Transfers assist     Chair/bed transfer assist level: Minimal Assistance - Patient > 75%     Locomotion Ambulation   Ambulation assist      Assist level: Moderate Assistance - Patient 50 - 74% Assistive device: Hand held assist Max distance: 300'   Walk 10 feet activity   Assist     Assist level: Moderate Assistance - Patient - 50 - 74% Assistive device: Hand held assist   Walk 50 feet activity   Assist    Assist level: Moderate Assistance - Patient - 50 - 74% Assistive device: Hand held assist    Walk 150 feet activity   Assist    Assist  level: Moderate Assistance - Patient - 50 - 74% Assistive device: Hand held assist    Walk 10 feet on uneven surface  activity   Assist Walk 10 feet on uneven surfaces activity did not occur: Safety/medical concerns         Wheelchair     Assist Is the patient using a wheelchair?: No             Wheelchair 50 feet with 2 turns activity    Assist            Wheelchair 150 feet activity     Assist          Blood pressure 120/77, pulse 87, temperature 98.4 F (36.9 C), resp. rate 14, height 5' 6"  (1.676 m), weight 96.9 kg, SpO2 100 %.   Medical Problem List and Plan: 1.  Decline in ADL, mobility , and cognition secondary to anoxic brain injury             -patient may  shower             -ELOS/Goals: 18-21d  -Continue CIR therapies including PT, OT, and SLP   -reconsulted palliative care per request of sister. Appreciate their help   2.  Antithrombotics: -DVT/anticoagulation:  Pharmaceutical: Lovenox             -antiplatelet therapy: N/A 3. Pain Management: Tylenol prn.  4. Mood: LCSW to follow for evaluation and support.              -antipsychotic agents:  -  Risperdal scheduled bid    -9/23 more alert with lower risperdal, continue 0.27m bid for now along with TID depakote. Check depakote level Monday  -enclosure bed required for safety 5. Neuropsych: This patient is not capable of making decisions on her own behalf. 6. Skin/Wound Care:  Monitor abdominal wound for healing.             --routine pressure relief measures.   7. Fluids/Electrolytes/Nutrition: encourage PO .    -protein supp for low albumin  8. Cellulitis abdomen: On Keflex --through 9/27. Continue to monitor for healing.  9. T2DM: Hgb A1c-6.9. Continue insulin glargine ---transition to trulicity at discharge?             --CBG (last 3)  Recent Labs    01/05/21 1654 01/05/21 2115 01/06/21 1147  GLUCAP 224* 109* 185*    9/23 continue 10u semglee in pm, increase am dose to 12u as  sugars a little low in AM 10. HTN: Monitor BP tid--continue Cozaar 11. SVT/NSVT: On coreg and amiodarone bid for rate control.   12. Dysphagia:   -continue D3, thins for now     LOS: 4 days A FACE TO FACE EVALUATION WAS PERFORMED  ZMeredith Staggers9/23/2022, 11:55 AM

## 2021-01-06 NOTE — Discharge Instructions (Addendum)
  Inpatient Rehab Discharge Instructions  GLADA WICKSTROM Discharge date and time: 01/24/21   Activities/Precautions/ Functional Status: Activity: no lifting, driving, or strenuous exercise for  Diet:  soft foods Wound Care: none needed   Functional status:  ___ No restrictions     ___ Walk up steps independently _X__ 24/7 supervision/assistance   ___ Walk up steps with assistance ___ Intermittent supervision/assistance  ___ Bathe/dress independently ___ Walk with walker     _X__ Bathe/dress with assistance ___ Walk Independently    ___ Shower independently ___ Walk with assistance    ___ Shower with assistance _X__ No alcohol     ___ Return to work/school ________  Special Instructions:  Outpatient: PT     OT    [facility] Outpatient Phone: [phone]             Appointment Date/Time: *Please expect follow-up with 7-10 business days to schedule your appointment. If you have not received follow-up, be sure to contact the site directly.*  GENERAL COMMUNITY RESOURCES FOR PATIENT/FAMILY: Be sure to follow-up with insurance- [facility] Medicaid to discuss personal care services [facility]) referral to check status with [place] [facility] [phone]. If transportation services are needed, please contact [employer] [phone].   My questions have been answered and I understand these instructions. I will adhere to these goals and the provided educational materials after my discharge from the hospital.  Patient/Caregiver Signature _______________________________ Date __________  Clinician Signature _______________________________________ Date __________  Please bring this form and your medication list with you to all your follow-up doctor's appointments.  COMMUNITY REFERRALS UPON DISCHARGE:

## 2021-01-06 NOTE — Progress Notes (Signed)
Speech Language Pathology Daily Session Note  Patient Details  Name: Courtney Davies MRN: [id number] Date of Birth: 1975/05/09  Today's Date: 01/06/2021 SLP Individual Time: 5009-3818 SLP Individual Time Calculation (min): 45 min  Short Term Goals: Week 1: SLP Short Term Goal 1 (Week 1): Patient will demonstrate efficient mastication with complete oral clearance with trials of Dys. 2 textures over 2 sessions with Min verbal cues prior to upgrade. SLP Short Term Goal 2 (Week 1): Patient will utilize external memory aids for recall to place, date and situation with Mod verbal and visual cues. SLP Short Term Goal 3 (Week 1): Patient will demonstrate functional problem solving for basic and familiar tasks with Mod verbal cues. SLP Short Term Goal 4 (Week 1): Patient will demonstrate sustained attention to functional tasks for 15 minutes with Mod verbal cues for redirection. SLP Short Term Goal 5 (Week 1): Patient will utilize word-finding strategies at the sentence level with Mod multimodal cues.  Skilled Therapeutic Interventions: Skilled treatment session focused on cognitive goals. Upon arrival, patient was awake and appeared confused while sitting upright in the enclosure bed. SLP facilitated session by providing Max A for transfer to chair due to difficulty with shifting her weight forward. Patient consumed breakfast meal without overt s/s of aspiration but required frequent redirection to task. Throughout session, patient verbose with language of confusion with word-finding deficits noted. Patient had been incontinent of urine without awareness. Patient transferred to the commode with Max +2 assist due to issues with motor planning and problem solving. Patient able to void on commode. New brief and pants donned. Patient transferred back to bed at end of session for a nap per her request. Patient left supine in enclosure bed with all needs within reach. Continue with current plan of care.       Pain Patient reporting pain in legs, Physician aware.   Therapy/Group: Individual Therapy  Deryl Ports 01/06/2021, 8:54 AM

## 2021-01-06 NOTE — Progress Notes (Signed)
Daily Progress Note   Patient Name: Courtney Davies       Date: 01/06/2021 DOB: 21-Aug-1975  Age: 45 y.o. MRN#: [id number] Attending Physician: Meredith Staggers, MD Primary Care Physician: [facility]mit Date: 01/02/2021  Reason for Consultation/Follow-up: Establishing goals of care  Subjective: Medical records reviewed. Patient assessed at the bedside.  She is standing at bedside with nursing staff at her side. Seems slightly agitated. Easily redirected. Tells me her lower legs are sore. She ate half of her breakfast and is asking for ensure.   Called patient's sister [name] as she called PMT 9/22 asking for our involvement. We reviewed situation. [name] is concerned about patient's cognition and agitation. We discuss her anoxic brain injury. [name] tells me she is not sure she fully understands what I mean by anoxic brain injury so I described this to her and reviewed MRI. We discussed that patient continues to work with OT, PT, and SLP. We discussed medications used for her agitation. [name] is concerned about patient being overly sedated. We discussed that her dose has been lowered and when I saw her today she was alert. We discussed careful balance of managing agitation and sedation. [name] asks if the palliative team will be meeting with her family regularly. We discuss that we certainly can but it seems that the goals of patient's care are quite clear - we discussed previously stated desires for full scope care. We discussed family has expressed openness to any medical intervention offered to prolong life. [name] confirms this is true. She tells me she would like more updates from rehab team. I explained I would share this with team.   Questions and concerns addressed.  PMT contact information provided. Patient's family was encouraged to call with additional needs or concerns.  Length of Stay: 4  Current Medications: Scheduled Meds:  . acetaminophen  650 mg Oral Q6H  . amiodarone  200 mg Oral BID  . carvedilol  6.25 mg Per Tube BID WC  . cephALEXin  500 mg Oral Q8H  . divalproex  250 mg Oral Q8H  . enoxaparin (LOVENOX) injection  40 mg Subcutaneous Q24H  . insulin aspart  0-5 Units Subcutaneous QHS  . insulin aspart  0-9 Units Subcutaneous TID WC  . insulin glargine-yfgn  10 Units Subcutaneous Daily  . [START  ON 01/07/2021] insulin glargine-yfgn  12 Units Subcutaneous Daily  . losartan  25 mg Per Tube Daily  . multivitamin with minerals  1 tablet Oral Daily  . nicotine  21 mg Transdermal Daily  . pantoprazole  40 mg Oral BID  . Ensure Max Protein  11 oz Oral BID WC  . risperiDONE  0.5 mg Oral BID    Continuous Infusions:    PRN Meds: acetaminophen, albuterol, alum & mag hydroxide-simeth, bisacodyl, diphenhydrAMINE, guaiFENesin-dextromethorphan, LORazepam, polyethylene glycol, prochlorperazine **OR** prochlorperazine **OR** prochlorperazine, sodium phosphate, traZODone  Physical Exam Vitals and nursing note reviewed.  Constitutional:      General: She is not in acute distress. Pulmonary:     Effort: Pulmonary effort is normal.  Skin:    General: Skin is warm and dry.  Neurological:     Mental Status: She is alert. She is disoriented.  Psychiatric:        Cognition and Memory: Cognition is impaired.     Comments: Mild agitation            Vital Signs: BP 120/77 (BP Location: Left Arm)   Pulse 87   Temp 98.4 F (36.9 C)   Resp 14   Ht 5' 6"  (1.676 m)   Wt 96.9 kg   SpO2 100%   BMI 34.48 kg/m  SpO2: SpO2: 100 % O2 Device: O2 Device: Room Air O2 Flow Rate:    Intake/output summary:  Intake/Output Summary (Last 24 hours) at 01/06/2021 1500 Last data filed at 01/06/2021 0800 Gross per 24 hour  Intake 240 ml  Output --  Net 240  ml    LBM: Last BM Date: 01/04/21 Baseline Weight: Weight: 97.1 kg Most recent weight: Weight: 96.9 kg       Palliative Assessment/Data: 50%      Patient Active Problem List   Diagnosis Date Noted  . Anoxic brain injury ([place]) 01/02/2021  . Fever 12/20/2020  . Abdominal wall cellulitis 12/20/2020  . Dysphagia   . Goals of care, counseling/discussion   . Acute systolic heart failure ([place])   . Encephalopathy acute   . Type 1 diabetes mellitus without complication ([place]) 35/32/9924  . Hypertension 12/02/2020  . GERD (gastroesophageal reflux disease) 12/02/2020  . Anemia 12/02/2020  . Cardiac arrest ([place]) 12/01/2020  . Unilateral primary osteoarthritis, left knee 12/01/2018  . Lateral meniscus, posterior horn derangement, left 05/14/2018  . Status post arthroscopy of left knee 05/14/2018  . 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/19/2017  . Anemia 04/23/2017  . Acute bronchitis 04/23/2017  . Chronic back pain 10/03/2015  . Symptomatic anemia 04/10/2014  . Bilateral edema of lower extremity   . Low back pain with left-sided sciatica     Palliative Care Assessment & Plan   Patient Profile: 45 y.o. female  with past medical history of HTN, DM type 2. B12 def GERD OA admitted on 12/01/2020 with post arrest downtime ~10 minutes, + COVID, NSTEMI, CTA negative PE. Previously had COVID infection ~4 weeks prior to arrest and reportedly recovered and returned to work. Beginning to be more alert and extubated 12/06/20. MRI does show anoxic brain injury.   Assessment: Hypoxic encephalopathy s/p acute respiratory failure Acute systolic heart failure, compensated Deconditioning Goals of care conversation  Recommendations/Plan: Continue plan of care for CIR team Full code/full scope Psychosocial and emotional support provided PMT remains available to family for support as they are faced with complex decision-making  Goals of Care and Additional Recommendations: Limitations on Scope  of Treatment:  Full Scope Treatment  Care plan was discussed with sister [name] and CIR team  Total time: 45 minutes Greater than 50%  of this time was spent counseling and coordinating care related to the above assessment and plan.  Juel Burrow, DNP, AGNP-C Palliative Medicine Team Team Phone # [phone]  Pager # [phone]  Thank you for allowing the Palliative Medicine Team to assist in the care of this patient. Please utilize secure chat with additional questions, if there is no response within 30 minutes please call the above phone number.  Palliative Medicine Team providers are available by phone from 7am to 7pm daily and can be reached through the team cell phone.  Should this patient require assistance outside of these hours, please call the patient's attending physician.

## 2021-01-06 NOTE — Progress Notes (Signed)
Physical Therapy TBI Note  Patient Details  Name: Courtney Davies MRN: [id number] Date of Birth: 07-03-1975  Today's Date: 01/06/2021 PT Individual Time: 9629-5284 PT Individual Time Calculation (min): 53 min   Short Term Goals: Week 1:  PT Short Term Goal 1 (Week 1): Pt will perform bed mobility with minA. PT Short Term Goal 2 (Week 1): Pt will perform bed to chair transfer with minA. PT Short Term Goal 3 (Week 1): Pt will ambulate x100' with minA. PT Short Term Goal 4 (Week 1): Pt will complete balance assessment.  Skilled Therapeutic Interventions/Progress Updates:     Pt received seated at edge of Posey bed with RN staff present. Pt energetic and happy and agreeable to therapy. No complaint of pain. Sit to stand with minA and cues for initiation and sequencing. Pt ambulates x250' with minA and cues for navigation and maintaining on task as pt becomes externally distracted easily. Pt perform Nustep for strength and endurance training, as well as testing pt's attention to task. PT cues pt to perform Nustep for 12:00 with rest breaks as needed. Pt completes at workload of 5 with several brief rest breaks. Pt recalls that PT gave her a specific command on amount of time to perform activity but cannot remember details, attempting to stop multiple times before 12 minutes. Pt noted to become less energetic during activity and PT cues multiple times to take rest breaks to conserve energy but pt says she prefers to keep going. Following Nustep, pt ambulates 250' back to room with minA HHA, and pt much less verbal and energetic than at beginning of session. PT able to guide pt to sitting position at EOB. Once seated, pt becomes increasingly upset, perseverating on wanting to go home. PT is able to redirect pt with some success, re-orienting pt to situation and explaining why she needs to stay in hospital. Pt becomes tearful but is eventually agreeable to laying down in posey bed. Sit to supine with modA  management of pt's trunk and legs. Pt left with staff in room.  Therapy Documentation Precautions:  Precautions Precautions: Fall Precaution Comments: incontinence, abdominal wound (from PEG tube) Restrictions Weight Bearing Restrictions: No Other Position/Activity Restrictions: posey waist restraint, all bed rails   Therapy/Group: Individual Therapy  Breck Coons, PT, DPT 01/06/2021, 4:03 PM

## 2021-01-06 NOTE — Progress Notes (Signed)
   01/06/21 0418  What Happened  Was fall witnessed? Yes  Who witnessed fall? Carolin Guernsey  Patients activity before fall to/from bed, chair, or stretcher  Point of contact buttocks  Was patient injured? No  Follow Up  MD notified Rockne Coons, PA  Time MD notified 0425  Family notified Yes - comment ([name], sister)  Time family notified 0424  Additional tests No  Progress note created (see row info) Yes  Adult Fall Risk Assessment  Risk Factor Category (scoring not indicated) High fall risk per protocol (document High fall risk)  Age 45  Fall History: Fall within 6 months prior to admission 5  Elimination; Bowel and/or Urine Incontinence 2  Elimination; Bowel and/or Urine Urgency/Frequency 0  Medications: includes PCA/Opiates, Anti-convulsants, Anti-hypertensives, Diuretics, Hypnotics, Laxatives, Sedatives, and Psychotropics 5  Patient Care Equipment 1  Mobility-Assistance 2  Mobility-Gait 2  Mobility-Sensory Deficit 0  Altered awareness of immediate physical environment 0  Impulsiveness 2  Lack of understanding of one's physical/cognitive limitations 4  Total Score 23  Patient Fall Risk Level High fall risk  Adult Fall Risk Interventions  Required Bundle Interventions *See Row Information* High fall risk - low, moderate, and high requirements implemented  Additional Interventions Use of appropriate toileting equipment (bedpan, BSC, etc.)  Screening for Fall Injury Risk (To be completed on HIGH fall risk patients) - Assessing Need for Floor Mats  Risk For Fall Injury- Criteria for Floor Mats TeleSitter camera in use  Will Implement Floor Mats Yes  Vitals  Temp 98.4 F (36.9 C)  BP 120/77  MAP (mmHg) 90  BP Location Left Arm  BP Method Automatic  Patient Position (if appropriate) Lying  Pulse Rate 87  Pulse Rate Source Monitor  Resp 14  Oxygen Therapy  SpO2 100 %  O2 Device Room Air  Pain Assessment  Pain Scale 0-10  Pain Score 0  Neurological  Neuro (WDL) X  Level of  Consciousness Alert  Orientation Level Oriented to person  Cognition Impulsive;Poor attention/concentration;Poor judgement;Poor safety awareness  Speech Clear  Pupil Assessment  No  R Hand Grip Strong  L Hand Grip Strong  R Foot Plantar Flexion Moderate  L Foot Plantar Flexion Moderate  RUE Motor Response Purposeful movement  LUE Motor Response Purposeful movement  RLE Motor Response Purposeful movement  LLE Motor Response Purposeful movement  Neuro Symptoms Forgetful  Neuro symptoms relieved by Rest  Musculoskeletal  Musculoskeletal (WDL) X  Assistive Device Standard walker  Generalized Weakness Yes  Weight Bearing Restrictions No  Integumentary  Integumentary (WDL) X  Skin Color Appropriate for ethnicity  Skin Condition Dry  Skin Turgor Non-tenting

## 2021-01-06 NOTE — Progress Notes (Signed)
Was the fall witnessed: Yes   Patient condition before and after the fall: Patient in bed sitting on edge of bed  After fall pt on floor in no distress   Patient's reaction to the fall: patient  in no distress  Name of the doctor that was notified including date and time: Algis Liming PA per flowsheet   Any interventions and vital signs: vital signs stable  interventions as follows placing patient back in vail bed

## 2021-01-06 NOTE — Progress Notes (Signed)
Patient ID: Courtney Davies, female   DOB: [birth year], 45 y.o.   MRN: [id number]  SW spoke with pt sisters [name] and [name] to discuss main contact. Both agree [name] is main contact going forward. SW explained to [name] how to apply for social security disability. SW will f/u once there is more information regarding pt care needs.   SW left message for [facility] [phone]) to confirm if PCS order was received.   [name], MSW, [place] Office: [phone] Cell: [phone] Fax: [phone]

## 2021-01-07 LAB — GLUCOSE, CAPILLARY
Glucose-Capillary: 180 mg/dL — ABNORMAL HIGH (ref 70–99)
Glucose-Capillary: 271 mg/dL — ABNORMAL HIGH (ref 70–99)
Glucose-Capillary: 132 mg/dL — ABNORMAL HIGH (ref 70–99)
Glucose-Capillary: 180 mg/dL — ABNORMAL HIGH (ref 70–99)

## 2021-01-07 MED ORDER — LORAZEPAM 2 MG/ML IJ SOLN
2.0000 mg | INTRAMUSCULAR | Status: DC | PRN
  Administered 2021-01-07: 2 mg via INTRAMUSCULAR
  Filled 2021-01-07: qty 1

## 2021-01-07 MED ORDER — LORAZEPAM 0.5 MG PO TABS
0.5000 mg | ORAL_TABLET | ORAL | Status: DC | PRN
  Administered 2021-01-07 – 2021-01-21 (×22): 0.5 mg
  Filled 2021-01-07 (×22): qty 1

## 2021-01-07 MED ORDER — LORAZEPAM 2 MG/ML IJ SOLN
2.0000 mg | INTRAMUSCULAR | Status: DC | PRN
Start: 2021-01-07 — End: 2021-01-07

## 2021-01-07 MED ORDER — LORAZEPAM 1 MG PO TABS
2.0000 mg | ORAL_TABLET | ORAL | Status: DC | PRN
Start: 2021-01-07 — End: 2021-01-07

## 2021-01-07 MED ORDER — LORAZEPAM 1 MG PO TABS: 2.0000 mg | ORAL_TABLET | ORAL | Status: DC | PRN

## 2021-01-07 MED ORDER — LORAZEPAM 2 MG/ML IJ SOLN: 0.5000 mg | INTRAMUSCULAR | Status: DC | PRN

## 2021-01-07 NOTE — Progress Notes (Addendum)
Physical Therapy Session Note  Patient Details  Name: Courtney Davies MRN: [id number] Date of Birth: [birth year]  Today's Date: 01/07/2021 PT Individual Time: 3276-1470; 9295-7473 PT Individual Time Calculation (min): 50 min , 30 min  Short Term Goals: Week 1:  PT Short Term Goal 1 (Week 1): Pt will perform bed mobility with minA. PT Short Term Goal 2 (Week 1): Pt will perform bed to chair transfer with minA. PT Short Term Goal 3 (Week 1): Pt will ambulate x100' with minA. PT Short Term Goal 4 (Week 1): Pt will complete balance assessment.  Skilled Therapeutic Interventions/Progress Updates:  Tx 1: pt missed 10 min of session due to cognitive deficits, increasing agitation  Pt asleep in bed; she awakened easily.  Pt was confused, confabulatory and intermittently uncooperative during session.  Pt had doffed brief and pants earlier.  Pt rolled L><R with supervision to assist PT with donning brief, but unable to sustain attention to this task long enough to be functional.  +2 for donning brief with pt in supine.  PT threaded her pants on in supine, and she eventually bridged up partially to completely pull them over hips, after +2 for majority of process.  Pt stated that she was hungry.  She sat up EOB with mod assist and ate approx 1/3 of cheeseburger, taking large unsafe bites at times and not receptive to cues for safety.  Pt drank a few sips of water.  Attempted sit>< stand +2, but pt would not shift wt forward sufficiently and attention declined, and agitation increased.  Sit> supine with mod assist.  At end of session, pt resting in enclosure bed , fully zipped, with call bell at hand. Tx 2:  Pt sitting up in tilt in space wc. Josh, RN present.  Pt reported need to use toilet.  +2 toilet transfer due to pt's difficulty motor planning.  Pt managed clothes in standing intermittently, without LOB or knees buckling.  Pt continent of bladder.  Peri care with supervision.    In sitting, PROM and  stretching R/L hamstrings x 30 seconds x 3, and stretching R/L heel cords x 30 seconds x 3.  Pt tolerated well.   Attempted sit> stand in parallel bars.  Pt unsafely scoots her hips too far forward repeatedly when attempting to stand.  +2 needed to bring pt's hips safely back into w/c.  Pt may benefit from use of a Maxi Sky or Clarise Cruz Plus to bring pt to standing.  At end of session, pt tilted far back in tilt -in -space w/c, with friends visiting.  Josh, RN informed.  Seat belt alarm set.     Therapy Documentation Precautions:  Precautions Precautions: Fall Precaution Comments: incontinence, abdominal wound (from PEG tube) Restrictions Weight Bearing Restrictions: No Other Position/Activity Restrictions: posey waist restraint, all bed rails   Pain:tx 1:  pt c/o LEs hurting during attempted sit> stand.  Unable to rate. PROM addressed in next session. Tx 2: no c/o         Therapy/Group: Individual Therapy  Nylani Michetti 01/07/2021, 1:53 PM

## 2021-01-07 NOTE — Progress Notes (Signed)
Speech Language Pathology Daily Session Note  Patient Details  Name: Courtney Davies MRN: [id number] Date of Birth: Oct 05, 1975  Today's Date: 01/07/2021 SLP Individual Time: 9150-4136 SLP Individual Time Calculation (min): 25 min  Short Term Goals: Week 1: SLP Short Term Goal 1 (Week 1): Patient will demonstrate efficient mastication with complete oral clearance with trials of Dys. 2 textures over 2 sessions with Min verbal cues prior to upgrade. SLP Short Term Goal 2 (Week 1): Patient will utilize external memory aids for recall to place, date and situation with Mod verbal and visual cues. SLP Short Term Goal 3 (Week 1): Patient will demonstrate functional problem solving for basic and familiar tasks with Mod verbal cues. SLP Short Term Goal 4 (Week 1): Patient will demonstrate sustained attention to functional tasks for 15 minutes with Mod verbal cues for redirection. SLP Short Term Goal 5 (Week 1): Patient will utilize word-finding strategies at the sentence level with Mod multimodal cues.  Skilled Therapeutic Interventions: Pt seen for skilled ST with focus on cognitive goals. Pt initially snoring and difficult to fully rouse, spoke with RN who states pt was agitated in the night and received Ativan. SLP returned after 20 minutes and patient waking to verbal greeting. Throughout tx session, pt with mostly language of confusion when communicating, frequent semantic and phonemic paraphasias. Pt perseverating on "doing application to start work", mod-max cues for redirection. SLP facilitating simple orientation and recall of biographical information with max A cues for ~25% accuracy. Pt stating she was cold, provided 2 blankets and enclosure bed re-zipped and secured. Pt left in bed, calm with all needs within reach. Cont ST POC.   Pain Pain Assessment Pain Scale: 0-10 Pain Score: 0-No pain  Therapy/Group: Individual Therapy  Dewaine Conger 01/07/2021, 8:45 AM

## 2021-01-07 NOTE — Progress Notes (Signed)
Occupational Therapy BI Note  Patient Details  Name: Courtney Davies MRN: [id number] Date of Birth: 18-Feb-1976  Today's Date: 01/07/2021 OT Individual Time: 1000-1010 OT Individual Time Calculation (min): 10 min    Short Term Goals: Week 1:  OT Short Term Goal 1 (Week 1): Pt will be A&Ox4 with external cues PRN OT Short Term Goal 2 (Week 1): Pt will participate in ADLs 3 days in a row without agitation OT Short Term Goal 3 (Week 1): Pt will complete bed mobility with close (S) OT Short Term Goal 4 (Week 1): Pt will bathe UB with min cuing for attention to task  Skilled Therapeutic Interventions/Progress Updates:     Pt received in enclosure bed on all fours with no brief on. Pt hyperverbal wanting to get OOB and "go drive to pick up my boys." Door shut to decrease noisy stimulation from hallway. Increased time for gentle orientation, however pt unable to unable to retain and continues to be hyperverbal.Pt unable to follow commands at OT opened enclosure bed pt nearly somersaulting out of bed with max A to prevent fall. No +2 available therefore mobility not safe at this time d/t restlessness/agitation impacting ability to process and follow commands Pt missed 50 min skilled OT will attempt to make up later per POC.  Pt left at end of session in  enclosure bed with exit alarm on, call light in reach and all needs met   Therapy Documentation Precautions:  Precautions Precautions: Fall Precaution Comments: incontinence, abdominal wound (from PEG tube) Restrictions Weight Bearing Restrictions: No Other Position/Activity Restrictions: posey waist restraint, all bed rails General:   Vital Signs: Therapy Vitals Temp: 99.3 F (37.4 C) Temp Source: Oral Pulse Rate: 96 Resp: 18 BP: 113/74 Patient Position (if appropriate): Lying Oxygen Therapy SpO2: 100 % O2 Device: Room Air Pain:   Agitated Behavior Scale: BI  Observation Details Observation Environment: Pt room Start of  observation period - Date: 01/07/21 Start of observation period - Time: 0345 End of observation period - Date: 01/07/21 End of observation period - Time: 0400 Agitated Behavior Scale (DO NOT LEAVE BLANKS) Short attention span, easy distractibility, inability to concentrate: Present to a moderate degree Impulsive, impatient, low tolerance for pain or frustration: Present to a moderate degree Uncooperative, resistant to care, demanding: Present to a slight degree Violent and/or threatening violence toward people or property: Present to a moderate degree Explosive and/or unpredictable anger: Present to a slight degree Rocking, rubbing, moaning, or other self-stimulating behavior: Absent Pulling at tubes, restraints, etc.: Present to a moderate degree Wandering from treatment areas: Absent Restlessness, pacing, excessive movement: Present to a slight degree Repetitive behaviors, motor, and/or verbal: Present to a slight degree Rapid, loud, or excessive talking: Present to a moderate degree Sudden changes of mood: Present to a moderate degree Easily initiated or excessive crying and/or laughter: Absent Self-abusiveness, physical and/or verbal: Absent Agitated behavior scale total score: 30    Therapy/Group: Individual Therapy  Tonny Branch 01/07/2021, 6:58 AM

## 2021-01-07 NOTE — Progress Notes (Signed)
PT woke up around 3:40 am agitated. When I walked into her room it looked as if she had emesis on the left side of her enclosure bed but upon closer exam noticed it was her scheduled crushed medications with applesauce that she spit out this morning. Assisted pt to the restroom and back into her bed, requested ativan IM from pharmacy. Food/liquid offered, patient denied.

## 2021-01-08 LAB — GLUCOSE, CAPILLARY
Glucose-Capillary: 128 mg/dL — ABNORMAL HIGH (ref 70–99)
Glucose-Capillary: 141 mg/dL — ABNORMAL HIGH (ref 70–99)
Glucose-Capillary: 291 mg/dL — ABNORMAL HIGH (ref 70–99)
Glucose-Capillary: 106 mg/dL — ABNORMAL HIGH (ref 70–99)

## 2021-01-09 ENCOUNTER — Encounter (HOSPITAL_COMMUNITY): Payer: Self-pay | Admitting: Radiology

## 2021-01-09 LAB — BASIC METABOLIC PANEL
Anion gap: 7 (ref 5–15)
BUN: 9 mg/dL (ref 6–20)
CO2: 25 mmol/L (ref 22–32)
Calcium: 8.9 mg/dL (ref 8.9–10.3)
Chloride: 104 mmol/L (ref 98–111)
Creatinine, Ser: 0.82 mg/dL (ref 0.44–1.00)
GFR, Estimated: >60 mL/min (ref >60)
Glucose, Bld: 166 mg/dL — ABNORMAL HIGH (ref 70–99)
Potassium: 4.1 mmol/L (ref 3.5–5.1)
Sodium: 136 mmol/L (ref 135–145)

## 2021-01-09 LAB — CBC
HCT: 31.8 % — ABNORMAL LOW (ref 36.0–46.0)
Hemoglobin: 9.8 g/dL — ABNORMAL LOW (ref 12.0–15.0)
MCH: 22.1 pg — ABNORMAL LOW (ref 26.0–34.0)
MCHC: 30.8 g/dL (ref 30.0–36.0)
MCV: 71.8 fL — ABNORMAL LOW (ref 80.0–100.0)
Platelets: 258 10*3/uL (ref 150–400)
RBC: 4.43 MIL/uL (ref 3.87–5.11)
RDW: 15.8 % — ABNORMAL HIGH (ref 11.5–15.5)
WBC: 5.8 10*3/uL (ref 4.0–10.5)
nRBC: 0 % (ref 0.0–0.2)

## 2021-01-09 LAB — GLUCOSE, CAPILLARY
Glucose-Capillary: 186 mg/dL — ABNORMAL HIGH (ref 70–99)
Glucose-Capillary: 196 mg/dL — ABNORMAL HIGH (ref 70–99)
Glucose-Capillary: 134 mg/dL — ABNORMAL HIGH (ref 70–99)
Glucose-Capillary: 119 mg/dL — ABNORMAL HIGH (ref 70–99)

## 2021-01-09 MED ORDER — DIVALPROEX SODIUM 125 MG PO CSDR
500.0000 mg | DELAYED_RELEASE_CAPSULE | Freq: Three times a day (TID) | ORAL | Status: DC
  Administered 2021-01-09 – 2021-01-22 (×39): 500 mg via ORAL
  Filled 2021-01-09 (×38): qty 4

## 2021-01-09 MED ORDER — INSULIN GLARGINE-YFGN 100 UNIT/ML ~~LOC~~ SOLN
14.0000 [IU] | Freq: Every day | SUBCUTANEOUS | Status: DC
  Administered 2021-01-10 – 2021-01-17 (×8): 14 [IU] via SUBCUTANEOUS
  Filled 2021-01-09 (×8): qty 0.14

## 2021-01-09 NOTE — Progress Notes (Signed)
   01/09/21 1200  What Happened  Was fall witnessed? Yes  Who witnessed fall? Destiny NT, Courtney LPN  Patients activity before fall bathroom-assisted  Point of contact buttocks  Was patient injured? No  Patient found on floor  Found by Staff-comment  Stated prior activity  (Staff was with patient when it happened)  Follow Up  MD notified Reesa Chew  Time MD notified 0900  Family notified No - patient refusal  Progress note created (see row info) Yes  Adult Fall Risk Assessment  Risk Factor Category (scoring not indicated) Fall has occurred during this admission (document High fall risk)  Patient Fall Risk Level High fall risk  Adult Fall Risk Interventions  Required Bundle Interventions *See Row Information* High fall risk - low, moderate, and high requirements implemented  Screening for Fall Injury Risk (To be completed on HIGH fall risk patients) - Assessing Need for Floor Mats  Risk For Fall Injury- Criteria for Floor Mats Previous fall this admission  Will Implement Floor Mats Yes  Pain Assessment  Pain Scale 0-10  Pain Score 0  Neurological  Neuro (WDL) X  Level of Consciousness Alert  Orientation Level Oriented X4  Cognition Appropriate at baseline  RUE Motor Response Purposeful movement  RUE Sensation Full sensation  RUE Motor Strength 4  LUE Motor Response Purposeful movement  LUE Sensation Full sensation  LUE Motor Strength 4  RLE Motor Response Purposeful movement  RLE Sensation Full sensation  RLE Motor Strength 4  LLE Motor Response Purposeful movement  LLE Sensation Full sensation  LLE Motor Strength 4  Neuro Symptoms Agitation  Musculoskeletal  Musculoskeletal (WDL) X  Assistive Device Wheelchair  Generalized Weakness Yes  Weight Bearing Restrictions No  Integumentary  Integumentary (WDL) WDL

## 2021-01-09 NOTE — Progress Notes (Signed)
**  Late Entry**    01/06/21 1519  What Happened  Was fall witnessed? Yes  Who witnessed fall? Josh LPN  Patients activity before fall to/from bed, chair, or stretcher  Point of contact buttocks  Was patient injured? No  Patient found on floor  Follow Up  MD notified Pam, PA  Time MD notified [phone]  Family notified Yes - comment  Adult Fall Risk Assessment  Risk Factor Category (scoring not indicated) Fall has occurred during this admission (document High fall risk)  Age 45  Fall History: Fall within 6 months prior to admission 5  Elimination; Bowel and/or Urine Incontinence 2  Elimination; Bowel and/or Urine Urgency/Frequency 0  Medications: includes PCA/Opiates, Anti-convulsants, Anti-hypertensives, Diuretics, Hypnotics, Laxatives, Sedatives, and Psychotropics 5  Patient Care Equipment 1  Mobility-Assistance 2  Mobility-Gait 2  Mobility-Sensory Deficit 0  Altered awareness of immediate physical environment 0  Impulsiveness 2  Lack of understanding of one's physical/cognitive limitations 4  Total Score 23  Patient Fall Risk Level High fall risk  Adult Fall Risk Interventions  Required Bundle Interventions *See Row Information* High fall risk - low, moderate, and high requirements implemented  Additional Interventions PT/OT need assessed if change in mobility from baseline;Use of appropriate toileting equipment (bedpan, BSC, etc.);Camera surveillance (with patient/family notification & education)  Screening for Fall Injury Risk (To be completed on HIGH fall risk patients) - Assessing Need for Floor Mats  Risk For Fall Injury- Criteria for Floor Mats TeleSitter camera in use  Will Implement Floor Mats Yes  Vitals  Temp 98.6 F (37 C)  Temp Source Oral  BP 122/86  MAP (mmHg) 97  BP Location Right Arm  BP Method Automatic  Patient Position (if appropriate) Sitting  Pulse Rate 86  Pulse Rate Source Monitor  Resp 17  Oxygen Therapy  SpO2 100 %  O2 Device Room Air  Pain  Assessment  Pain Scale 0-10  Pain Score 0  Neurological  Neuro (WDL) X  Level of Consciousness Alert  Orientation Level Oriented to person  Cognition Impulsive;Poor attention/concentration;Poor judgement;Poor safety awareness  Speech Clear  Pupil Assessment  No  Motor Function/Sensation Assessment Grip;Motor response  R Hand Grip Strong  L Hand Grip Strong  R Foot Plantar Flexion Moderate  L Foot Plantar Flexion Moderate  RUE Motor Response Purposeful movement  RUE Sensation Full sensation  RUE Motor Strength 4  LUE Motor Response Purposeful movement  LUE Sensation Full sensation  LUE Motor Strength 4  RLE Motor Response Purposeful movement  RLE Sensation Full sensation  RLE Motor Strength 4  LLE Motor Response Purposeful movement  LLE Sensation Full sensation  LLE Motor Strength 4  Neuro Symptoms Forgetful;Agitation;Combative  Neuro symptoms relieved by Rest  Musculoskeletal  Musculoskeletal (WDL) X  Assistive Device Front wheel walker  Generalized Weakness Yes  Weight Bearing Restrictions No  Integumentary  Integumentary (WDL) X  Skin Color Appropriate for ethnicity  Skin Condition Dry  Skin Integrity Surgical Incision (see LDA)  Abrasion Location Chest  Abrasion Location Orientation Left  Abrasion Intervention Other (Comment)  Skin Turgor Non-tenting

## 2021-01-09 NOTE — Progress Notes (Signed)
Occupational Therapy Session Note  Patient Details  Name: Courtney Davies MRN: [id number] Date of Birth: [birth year]  Today's Date: 01/09/2021 OT Individual Time: 1100-1210 OT Individual Time Calculation (min): 70 min   Short Term Goals: Week 1:  OT Short Term Goal 1 (Week 1): Pt will be A&Ox4 with external cues PRN OT Short Term Goal 2 (Week 1): Pt will participate in ADLs 3 days in a row without agitation OT Short Term Goal 3 (Week 1): Pt will complete bed mobility with close (S) OT Short Term Goal 4 (Week 1): Pt will bathe UB with min cuing for attention to task  Skilled Therapeutic Interventions/Progress Updates:    Pt greeted semi-reclined in enclosure bed, awake and agreeable to OT treatment session. OT demonstrated use of Stedy prior to getting OOB. Pt then completed bed mobility with supervision. Used stedy for pt to practice as this is what nursing will use with patient. Pt still had difficulty motor planning sit<>stand in [place], but OT demonstrated pulling up and standing again, and then pt was able to perform with min A. Pt brought to the sink in [place] and performed UB bathing standing in [place]. Pt needed cues to wring out wash cloths and cues to move on to next body part 2/2 perseveration. Patient sat down and stedy removed so pt could widen her legs to reach private area. Pt needed CGA for balance but tolerated extended standing bouts.Pt very easy to redirect during OT session today, much more appropriate overall with no signs of aggressive behaviors. Pt needed min A for LB dressing but just set-up for UB. Pt then requested to go for a walk. Pt ambulated down to therapy gym with min A and worked on pathfinding and body awareness during ambulation> Pt able to make appropriate L and R choices on 3/4 occasions to find her room. Pt sat EOB and donned her lotion, then returned to bed without complaints. Pt left semi-reclined in enclosure bed with needs met.  Therapy  Documentation Precautions:  Precautions Precautions: Fall Precaution Comments: incontinence, abdominal wound (from PEG tube) Restrictions Weight Bearing Restrictions: No Other Position/Activity Restrictions: posey waist restraint, all bed rails Pain: Pain Assessment Pain Scale: 0-10 Pain Score: 0-No pain  Therapy/Group: Individual Therapy  Courtney Davies 01/09/2021, 12:37 PM

## 2021-01-09 NOTE — Progress Notes (Signed)
Speech Language Pathology Daily Session Note  Patient Details  Name: Courtney Davies MRN: [id number] Date of Birth: 1975/11/19  Today's Date: 01/09/2021 SLP Individual Time: 2800-3491 SLP Individual Time Calculation (min): 45 min  Short Term Goals: Week 1: SLP Short Term Goal 1 (Week 1): Patient will demonstrate efficient mastication with complete oral clearance with trials of Dys. 2 textures over 2 sessions with Min verbal cues prior to upgrade. SLP Short Term Goal 2 (Week 1): Patient will utilize external memory aids for recall to place, date and situation with Mod verbal and visual cues. SLP Short Term Goal 3 (Week 1): Patient will demonstrate functional problem solving for basic and familiar tasks with Mod verbal cues. SLP Short Term Goal 4 (Week 1): Patient will demonstrate sustained attention to functional tasks for 15 minutes with Mod verbal cues for redirection. SLP Short Term Goal 5 (Week 1): Patient will utilize word-finding strategies at the sentence level with Mod multimodal cues.  Skilled Therapeutic Interventions: Skilled treatment session focused on cognitive-linguistic goals. Upon arrival, patient was awake in the enclosure bed and requested food. Patient had not eaten her lunch yet of Dys. 3 textures with thin liquids. Patient consumed meal with intermittent overt s/s of aspiration with Mod verbal and visual cues needed for use of small bites and a slow pace. Recommend patient continue current diet. Patient with language of confusion throughout session but appeared more agreeable and cooperative than previous sessions. Patient participated in naming tasks and required Max sentence completion and phonemic cues for word-finding. Patient with awareness of errors and difficulty of task. SLP provided education and support. Patient also participated in a 4-step picture sequencing task and required Mod verbal cues for problem solving. Patient transferred back to enclosure bed at end of  session and left with all needs within reach. Continue with current plan of care.      Pain Pain Assessment Pain Scale: 0-10 Pain Score: 0-No pain  Therapy/Group: Individual Therapy  Courtney Davies 01/09/2021, 3:07 PM

## 2021-01-09 NOTE — Progress Notes (Signed)
PROGRESS NOTE   Subjective/Complaints: Had a fair night. Able to sleep. More agitated, restless but no severe behavior reported  ROS: Limited due to cognitive/behavioral    Objective:   No results found. Recent Labs    01/09/21 0703  WBC 5.8  HGB 9.8*  HCT 31.8*  PLT 258    Recent Labs    01/09/21 0703  NA 136  K 4.1  CL 104  CO2 25  GLUCOSE 166*  BUN 9  CREATININE 0.82  CALCIUM 8.9     Intake/Output Summary (Last 24 hours) at 01/09/2021 0951 Last data filed at 01/08/2021 1400 Gross per 24 hour  Intake 240 ml  Output --  Net 240 ml        Physical Exam: Vital Signs Blood pressure 123/81, pulse 84, temperature 98.4 F (36.9 C), temperature source Oral, resp. rate 18, height 5' 6"  (1.676 m), weight 96.9 kg, SpO2 100 %.  Constitutional: No distress . Vital signs reviewed. HEENT: NCAT, EOMI, oral membranes moist Neck: supple Cardiovascular: RRR without murmur. No JVD    Respiratory/Chest: CTA Bilaterally without wheezes or rales. Normal effort    GI/Abdomen: BS +, non-tender, non-distended Ext: no clubbing, cyanosis, or edema Psych: restless, impulsive  Skin: Clean and intact without signs of breakdown Neuro:  Pt alert. STM deficits, lacks insight and awareness.  Moves all 4's, normal sensation Musculoskeletal: Full ROM, No pain with AROM or PROM in the neck, trunk, or extremities.       Assessment/Plan: 1. Functional deficits which require 3+ hours per day of interdisciplinary therapy in a comprehensive inpatient rehab setting. Physiatrist is providing close team supervision and 24 hour management of active medical problems listed below. Physiatrist and rehab team continue to assess barriers to discharge/monitor patient progress toward functional and medical goals  Care Tool:  Bathing    Body parts bathed by patient: Right arm   Body parts bathed by helper: Buttocks     Bathing assist  Assist Level: Moderate Assistance - Patient 50 - 74%     Upper Body Dressing/Undressing Upper body dressing   What is the patient wearing?: Pull over shirt    Upper body assist Assist Level: Moderate Assistance - Patient 50 - 74%    Lower Body Dressing/Undressing Lower body dressing      What is the patient wearing?: Incontinence brief     Lower body assist Assist for lower body dressing: Maximal Assistance - Patient 25 - 49%     Toileting Toileting    Toileting assist Assist for toileting: 2 Helpers     Transfers Chair/bed transfer  Transfers assist     Chair/bed transfer assist level: Minimal Assistance - Patient > 75%     Locomotion Ambulation   Ambulation assist      Assist level: Moderate Assistance - Patient 50 - 74% Assistive device: Hand held assist Max distance: 300'   Walk 10 feet activity   Assist     Assist level: Moderate Assistance - Patient - 50 - 74% Assistive device: Hand held assist   Walk 50 feet activity   Assist    Assist level: Moderate Assistance - Patient - 50 - 74% Assistive  device: Hand held assist    Walk 150 feet activity   Assist    Assist level: Moderate Assistance - Patient - 50 - 74% Assistive device: Hand held assist    Walk 10 feet on uneven surface  activity   Assist Walk 10 feet on uneven surfaces activity did not occur: Safety/medical concerns         Wheelchair     Assist Is the patient using a wheelchair?: No             Wheelchair 50 feet with 2 turns activity    Assist            Wheelchair 150 feet activity     Assist          Blood pressure 123/81, pulse 84, temperature 98.4 F (36.9 C), temperature source Oral, resp. rate 18, height 5' 6"  (1.676 m), weight 96.9 kg, SpO2 100 %.   Medical Problem List and Plan: 1.  Decline in ADL, mobility , and cognition secondary to anoxic brain injury             -patient may  shower             -ELOS/Goals:  18-21d  -Continue CIR therapies including PT, OT, and SLP  -spoke with sister at length over weekend about prognosis, behavior, meds, etc. Answered all questions. Mother worked in [place] and is a little concerned about medications pt is on given her experience with meds in past.    2.  Antithrombotics: -DVT/anticoagulation:  Pharmaceutical: Lovenox             -antiplatelet therapy: N/A 3. Pain Management: Tylenol prn.  4. Mood: LCSW to follow for evaluation and support.              -antipsychotic agents:  -  Risperdal scheduled bid    -9/26 more alert with lower risperdal but definitely has been more "wound up" with the decrease. Will keep same dose for now    -increase depakote to 557m q8 has level low from 9/22 -enclosure bed required for safety 5. Neuropsych: This patient is not capable of making decisions on her own behalf. 6. Skin/Wound Care:  Monitor abdominal wound for healing.             --routine pressure relief measures.   7. Fluids/Electrolytes/Nutrition: encourage PO .  I personally reviewed the patient's labs today.    -protein supp for low albumin  8. Cellulitis abdomen: On Keflex --through 9/27. Continue to monitor for healing.  9. T2DM: Hgb A1c-6.9. Continue insulin glargine ---transition to trulicity at discharge?             --CBG (last 3)  Recent Labs    01/08/21 1658 01/08/21 2102 01/09/21 0603  GLUCAP 291* 106* 186*    9/26  continue 10u semglee in pm, increase am dose to 14u for higher PM readings 10. HTN: Monitor BP tid--continue Cozaar 11. SVT/NSVT: On coreg and amiodarone bid for rate control.   12. Dysphagia:   -continue D3, thins for now     LOS: 7 days A FACE TO FACE EVALUATION WAS PERFORMED  ZMeredith Staggers9/26/2022, 9:51 AM

## 2021-01-09 NOTE — Progress Notes (Signed)
Physical Therapy Session Note  Patient Details  Name: Courtney Davies MRN: [id number] Date of Birth: 1976-01-13  Today's Date: 01/09/2021 PT Individual Time: 0900-1005 PT Individual Time Calculation (min): 65 min   Short Term Goals: Week 1:  PT Short Term Goal 1 (Week 1): Pt will perform bed mobility with minA. PT Short Term Goal 2 (Week 1): Pt will perform bed to chair transfer with minA. PT Short Term Goal 3 (Week 1): Pt will ambulate x100' with minA. PT Short Term Goal 4 (Week 1): Pt will complete balance assessment.  Skilled Therapeutic Interventions/Progress Updates:     Discussed recent fall with nursing staff prior to session. Plan to focus initial part of session on safe transfer training for patient with nursing staff. Courtney Davies, charge nurse, present and provided +2 for bathroom transfers and provided morning medications during session. Patient in secured enclosure upon PT arrival. Patient alert and agreeable to PT session, requesting to go to the bathroom. Patient denied pain during session.  Therapeutic Activity: Bed Mobility: Patient performed supine to/from sit with supervision. Provided verbal cues for initiation. Transfers: Patient performed toilet transfer using the [facility]. Required increased time and cues for understanding use of Stedy, as patient attempted several times to place her feet over the shin guard to "reach the floor." Patient able to be redirected and follow cues to stand with min A and was able to repeat to return from toileting with +2 assist for device management. Patient was continent of bladder and performed peri-care and hand hygiene with supervision.  Updated safety sheet to reflect use of Stedy with +2 assist for transfers with nursing staff for reduced fall risk.   She performed sit to/from stand x3 with min A without AD. Provided verbal cues for initiation and safety. Did not provide cues for scooting forward or forward weight shift to prevent overshooting  due to history of falls from sliding off of surfaces.   Gait Training:  Patient ambulated 380 feet x2 using HHA with min A-CGA. Ambulated with decreased gait speed, step height, and step length, mild trendelenburg, and downward head gaze. Provided verbal cues for attention to environment, increased gait speed for improved balance, and path finding on first trial, then patient able to follow the same path on second trial with 2 cues.  Neuromuscular Re-ed: Patient performed the following functional activities for improved motor control and motor planning with blocked practice for increased repetitions for improved recall: -sit to stand 2x10 without upper extremity support with CGA -stand pivot 2x10 without upper extremity support with CGA  Patient requested a snack. Perseverated on a specific snack that she could not recall the name of. Provided multiple options to patient, patient settled on shortbread cookies and vanilla ensure, RN made aware.   Patient in secured enclosure bed, after increased encouragement and education on purpose and improved safety with this bed, at end of session with breaks locked and all needs within reach. Turned on R&B station for improved stimulation in the room, patient appreciative.   Therapy Documentation Precautions:  Precautions Precautions: Fall Precaution Comments: incontinence, abdominal wound (from PEG tube) Restrictions Weight Bearing Restrictions: No Other Position/Activity Restrictions: posey waist restraint, all bed rails   Therapy/Group: Individual Therapy  Roxy Filler L Maricia Scotti PT, DPT  01/09/2021, 12:47 PM

## 2021-01-10 LAB — GLUCOSE, CAPILLARY
Glucose-Capillary: 165 mg/dL — ABNORMAL HIGH (ref 70–99)
Glucose-Capillary: 137 mg/dL — ABNORMAL HIGH (ref 70–99)
Glucose-Capillary: 163 mg/dL — ABNORMAL HIGH (ref 70–99)
Glucose-Capillary: 97 mg/dL (ref 70–99)

## 2021-01-10 MED ORDER — DOCUSATE SODIUM 100 MG PO CAPS
200.0000 mg | ORAL_CAPSULE | Freq: Every day | ORAL | Status: DC
  Administered 2021-01-10 – 2021-01-24 (×12): 200 mg via ORAL
  Filled 2021-01-10 (×12): qty 2

## 2021-01-10 NOTE — Plan of Care (Signed)
  Problem: Safety: Goal: Non-violent Restraint(s) Outcome: Progressing   Problem: Consults Goal: RH BRAIN INJURY PATIENT EDUCATION Description: Description: See Patient Education module for eduction specifics Outcome: Progressing Goal: Skin Care Protocol Initiated - if Braden Score 18 or less Description: If consults are not indicated, leave blank or document N/A Outcome: Progressing Goal: Diabetes Guidelines if Diabetic/Glucose > 140 Description: If diabetic or lab glucose is > 140 mg/dl - Initiate Diabetes/Hyperglycemia Guidelines & Document Interventions  Outcome: Progressing   Problem: RH BOWEL ELIMINATION Goal: RH STG MANAGE BOWEL WITH ASSISTANCE Description: STG Manage Bowel with Min Assistance. Outcome: Progressing   Problem: RH BLADDER ELIMINATION Goal: RH STG MANAGE BLADDER WITH ASSISTANCE Description: STG Manage Bladder With Min Assistance Outcome: Progressing   Problem: RH SKIN INTEGRITY Goal: RH STG ABLE TO PERFORM INCISION/WOUND CARE W/ASSISTANCE Description: STG Able To Perform Incision/Wound Care With [employer]. Outcome: Progressing   Problem: RH SAFETY Goal: RH STG ADHERE TO SAFETY PRECAUTIONS W/ASSISTANCE/DEVICE Description: STG Adhere to Safety Precautions With Min Assistance/Device. Outcome: Progressing   Problem: RH COGNITION-NURSING Goal: RH STG USES MEMORY AIDS/STRATEGIES W/ASSIST TO PROBLEM SOLVE Description: STG Uses Memory Aids/Strategies With Min Assistance to Problem Solve. Outcome: Progressing Goal: RH STG ANTICIPATES NEEDS/CALLS FOR ASSIST W/ASSIST/CUES Description: STG Anticipates Needs/Calls for Assist With Min Assistance/Cues. Outcome: Progressing   Problem: RH PAIN MANAGEMENT Goal: RH STG PAIN MANAGED AT OR BELOW PT'S PAIN GOAL Description: < 3 on a 0-10 pain scale. Outcome: Progressing   Problem: RH KNOWLEDGE DEFICIT BRAIN INJURY Goal: RH STG INCREASE KNOWLEDGE OF SELF CARE AFTER BRAIN INJURY Description: Patient will  demonstrate knowledge on medication management, pain management, skin/wound care with educational materials and handouts provided by staff independently at discharge. Outcome: Progressing

## 2021-01-10 NOTE — Progress Notes (Signed)
Nurse called to room by telesitter due to patient being out of chair and walking around room. Patient guest encouraging patient to walk around room for family members on video chat. Patient toileted and redirected back to wheelchair and alarm belt placed back on patient. Family educated to the fact that the patient is not allowed to walk around room unsupervised without staff present. And patient is to have alarm on at all times due to being impulsive and they too need to redirect the patient when these behaviors are present. Telemonitoring continued. Sanda Linger, LPN

## 2021-01-10 NOTE — Progress Notes (Signed)
Patient ID: Courtney Davies, female   DOB: [birth year], 45 y.o.   MRN: [id number]  SW spoke with pt sister [name] to provide updates from team conference, and d/c date 10/11. SW stressed the importance of family education and ensuring that their mother is able to handle pt care needs as she can be anywhere from supervision to Mod A, and concerns if their mother can lift her if needed. SW will continue to follow-up with updates after team conference, and suggested family edu next week to get a better idea of patient progress.  [name], MSW, [place] Office: [phone] Cell: [phone] Fax: [phone]

## 2021-01-10 NOTE — Progress Notes (Addendum)
Occupational Therapy Weekly Progress Note  Patient Details  Name: Courtney Davies MRN: [id number] Date of Birth: 01-Jul-1975  Beginning of progress report period: January 03, 2021 End of progress report period: January 10, 2021  Today's Date: 01/10/2021 Session 1 OT Individual Time: 7591-6384 OT Individual Time Calculation (min): 56 min   Session 2 OT Individual Time: 6659-9357 OT Individual Time Calculation (min): 30 min    Patient has met 3 of 4 short term goals.  Patient has made slow but steady progress towards OT goals this week. While pt has had some set-back with assisted falls and explosive behaviors, the past 2 days she has demonstrated improved overall awareness and has been less agitated and easier to redirect during functional tasks. Patient can be as little as min A for BADL tasks, but it is dependent on her behavior, awareness, and ability to motor plan. Continue current POC at this time.    Patient continues to demonstrate the following deficits: muscle weakness, impaired timing and sequencing, motor apraxia, decreased coordination, and decreased motor planning, decreased initiation, decreased attention, decreased awareness, decreased problem solving, decreased safety awareness, decreased memory, and delayed processing, and decreased sitting balance, decreased standing balance, decreased postural control, hemiplegia, and decreased balance strategies and therefore will continue to benefit from skilled OT intervention to enhance overall performance with BADL and Reduce care partner burden.  Patient progressing toward long term goals..  Continue plan of care.  OT Short Term Goals Week 1:  OT Short Term Goal 1 (Week 1): Pt will be A&Ox4 with external cues PRN OT Short Term Goal 1 - Progress (Week 1): Progressing toward goal OT Short Term Goal 2 (Week 1): Pt will participate in ADLs 3 days in a row without agitation OT Short Term Goal 2 - Progress (Week 1): Met OT Short Term  Goal 3 (Week 1): Pt will complete bed mobility with close (S) OT Short Term Goal 3 - Progress (Week 1): Met OT Short Term Goal 4 (Week 1): Pt will bathe UB with min cuing for attention to task OT Short Term Goal 4 - Progress (Week 1): Met Week 2:  OT Short Term Goal 3 (Week 2): Pt will be A&Ox3 with external cues PRN OT Short Term Goal 4 (Week 2): Patient will demonstrate increaesed safety awareness during BADLs with moderate instrutcional cues OT Short Term Goal 5 (Week 2): Patient will recall 2 tasks she completed during BADL session with min questioning cues  Skilled Therapeutic Interventions/Progress Updates:    Session 1 Patient greeted semi-reclined in enclosure bed, eager to get out. Pt agreeable to washing up at the sink. Pt performed sit<>stand and functional ambulation with min A but moderate cues for body and safety awareness. Bathing and grooming tasks completed mostly in standing with poor recall forgetting that she had already washed certain body parts or brushed her teeth. Educated on safety to sit down and was lower legs/feet as well as to thread pant legs. OT wrote down everything we washed, groomed, and dressed during session and set that in enclosure bed for her to refer back to. Pt trying to put shirt on her legs as if shirt were pants. OT had pt try to name clothing items and where they went, but still needed max cues to place clothing on appropriate body parts. Patient performed functional ambulation in halllway to ortho gym, took rest break, then ambulated back to dayroom with min A. OT had pt look at the window and addressed orientation by discussing  the weather and leaves changing indicating fall.Patient demonstrated improved awareness of deficits, stating "I should probably go ahead and fill out some disability paperwork." Patient able to state "something happened to my heart and my brain." Patient ambulated back to room and left seated in enclosure bed with needs met. Denies  pain  Session 2 Pt greeted seated in TIS wc with mother present. Patient unable to recall therapists names, but when asked pt what we did in earlier OT session, she was able to recall that we washed up and changed clothes. Pt with improved motor planning with sit<>stands and ambulated to therapy gym with CGA. Worked on problem solving skills using color slide activity. Patient was able to keep her frustration level with a difficult task manageable. OT graded task and she was able to complete 3 color patterns with moderate cues. Pt ambulated in hallway with CGA and worked on dual task activity of locating colors in hallway. Pt returned to room and left seated in TIS wc with mother present, alarm belt on, and needs met.    Therapy Documentation Precautions:  Precautions Precautions: Fall Precaution Comments: incontinence, abdominal wound (from PEG tube) Restrictions Weight Bearing Restrictions: No Other Position/Activity Restrictions: posey waist restraint, all bed rails Pain:  Denies pain   01/10/21 1100  Observation Details  Observation Environment Room  Start of observation period - Date 01/10/21  Start of observation period - Time 0930  End of observation period - Date 01/10/21  End of observation period - Time 1030  Agitated Behavior Scale (DO NOT LEAVE BLANKS)  Short attention span, easy distractibility, inability to concentrate 3  Impulsive, impatient, low tolerance for pain or frustration 3  Uncooperative, resistant to care, demanding 1  Violent and/or threatening violence toward people or property 1  Explosive and/or unpredictable anger 1  Rocking, rubbing, moaning, or other self-stimulating behavior 1  Pulling at tubes, restraints, etc. 1  Wandering from treatment areas 2  Restlessness, pacing, excessive movement 2  Repetitive behaviors, motor, and/or verbal 2  Rapid, loud, or excessive talking 2  Sudden changes of mood 3  Easily initiated or excessive crying and/or  laughter 2  Self-abusiveness, physical and/or verbal 1  Agitated behavior scale total score 25     Therapy/Group: Individual Therapy  Valma Cava 01/10/2021, 3:27 PM

## 2021-01-10 NOTE — Patient Care Conference (Signed)
Inpatient RehabilitationTeam Conference and Plan of Care Update Date: 01/10/2021   Time: 10:48 AM   Patient Name: Courtney Davies      Medical Record Number: [id number]  Date of Birth: 10/05/1975 Sex: Female         Room/Bed: 4W15C/4W15C-01 Payor Info: Payor: [place] / Plan: [place] MEDICAID [id number] / Product Type: *No Product type* /    Admit Date/Time:  01/02/2021  1:44 PM  Primary Diagnosis:  Anoxic brain injury [facility])  Hospital Problems: Principal Problem:   Anoxic brain injury [facility])    Expected Discharge Date: Expected Discharge Date: 01/24/21  Team Members Present: Physician leading conference: Dr. Alger Simons Social Worker Present: [name], [place] Nurse Present: Dorthula Nettles, RN PT Present: Tereasa Coop, PT OT Present: Cherylynn Ridges, OT SLP Present: Weston Anna, SLP PPS Coordinator present : Gunnar Fusi, SLP     Current Status/Progress Goal Weekly Team Focus  Bowel/Bladder   continent b/b, LBM 9/25  remain continent  time toileting q 2 hr   Swallow/Nutrition/ Hydration   Dys. 3 textures with thin liquids, Mod A  Supervision  use of swallow strategies to increase safety with PO intake   ADL's   min/mod A mostly, but can need up to +2 for transfers dependning on motor planning. Can be as little as min A for transfers as well. Very poor safety and body awareness, easilt agitated  Supervision, lofty and may have to downgrade  body/spatial awareness, safety awareness, orientation, sit<>stands, motor planning, transfers, sefl-care retraining   Mobility   supervision to [place] for all mobility, depending on motor planning, attention to task, and initiation  Supervision  more consistent performance with PT, balance, ambulation, transfers, safety awareness   Communication   Mod-Max A  Supervision  use of strategies for word-finding   Safety/Cognition/ Behavioral Observations  Max-Total A  Supervision  basic problem  solving, orientation, attention   Pain   none reported, Tylenol available  < 3  assess pain q 4 hr and prn   Skin   old peg site  no new breakdown  assess skin q shift and prn     Discharge Planning:  Pt to d/c to her mother's home. SW will confirm there are no barriers to discharge.   Team Discussion: MD adjusting medications. Explained to family about the need for certain medications. Continent B/B, LBM 9/25. No pain reported. Trazodone for sleep. Abdominal wound is clean and dry. Nursing educating on safety and medications. Doesn't follow commands well. Has had 3 assisted falls since admission to CIR. Barriers are falls and cognition. Nursing now using steady for transfers. To go home with mom. Very high fall risk. Making good progress with OT. Has more awareness during sessions. Walked 380 ft with contact guard/min assist. Depends on the state of mind she is in. Dys 3, thin liquids. Coughs with cereal and chicken. Will not upgrade diet as of yet. Poor recall, safety awareness. She can be all over the place and she is confused.  Patient on target to meet rehab goals: yes, but huge safety risk and cognition is impaired.  *See Care Plan and progress notes for long and short-term goals.   Revisions to Treatment Plan:  MD weaning Risperdal, increased Depakote.   Teaching Needs: Family education, medication management, sleep management, behavior management, transfer training, gait training, balance training, endurance training, safety awareness.  Current Barriers to Discharge: Decreased caregiver support, Medical stability, Home enviroment access/layout, Wound care, Lack of/limited family  support, Insurance for SNF coverage, Medication compliance, and Behavior  Possible Resolutions to Barriers: Continue current medications, continue cognition management, continue behavior management, educate family on skin/wound care, provide emotional support.     Medical Summary Current Status: weaning  risperdal d/t sedation. more disinhibited behavior, thus i increased depakote. eating well. sleep fair. still in enclosure bed  Barriers to Discharge: Medical stability;Behavior   Possible Resolutions to [employer]: daily assessment of behavior and patient data/vs   Continued Need for Acute Rehabilitation Level of Care: The patient requires daily medical management by a physician with specialized training in physical medicine and rehabilitation for the following reasons: Direction of a multidisciplinary physical rehabilitation program to maximize functional independence : Yes Medical management of patient stability for increased activity during participation in an intensive rehabilitation regime.: Yes Analysis of laboratory values and/or radiology reports with any subsequent need for medication adjustment and/or medical intervention. : Yes   I attest that I was present, lead the team conference, and concur with the assessment and plan of the team.   Cristi Loron 01/10/2021, 2:45 PM

## 2021-01-10 NOTE — Progress Notes (Signed)
PROGRESS NOTE   Subjective/Complaints: Slept ok. Concerned that she's constipated. Hungry and wants to know why we aren't feeding her!  ROS: Patient denies fever, rash, sore throat, blurred vision, nausea, vomiting, diarrhea, cough, shortness of breath or chest pain, joint or back pain, headache, or mood change.   Objective:   No results found. Recent Labs    01/09/21 0703  WBC 5.8  HGB 9.8*  HCT 31.8*  PLT 258    Recent Labs    01/09/21 0703  NA 136  K 4.1  CL 104  CO2 25  GLUCOSE 166*  BUN 9  CREATININE 0.82  CALCIUM 8.9     Intake/Output Summary (Last 24 hours) at 01/10/2021 1149 Last data filed at 01/09/2021 1700 Gross per 24 hour  Intake 536 ml  Output --  Net 536 ml        Physical Exam: Vital Signs Blood pressure 128/86, pulse 80, temperature 98.2 F (36.8 C), temperature source Oral, resp. rate 18, height 5' 6"  (1.676 m), weight 96.9 kg, SpO2 100 %.  Constitutional: No distress . Vital signs reviewed. HEENT: NCAT, EOMI, oral membranes moist Neck: supple Cardiovascular: RRR without murmur. No JVD    Respiratory/Chest: CTA Bilaterally without wheezes or rales. Normal effort    GI/Abdomen: BS +, non-tender, non-distended Ext: no clubbing, cyanosis, or edema Psych: impulsive and disinhibited Skin: Clean and intact without signs of breakdown Neuro:  Pt alert. Ongoing STM deficits, lacks insight and awareness.  Moves all 4's, normal sensation Musculoskeletal: Full ROM, No pain with AROM or PROM in the neck, trunk, or extremities.       Assessment/Plan: 1. Functional deficits which require 3+ hours per day of interdisciplinary therapy in a comprehensive inpatient rehab setting. Physiatrist is providing close team supervision and 24 hour management of active medical problems listed below. Physiatrist and rehab team continue to assess barriers to discharge/monitor patient progress toward functional  and medical goals  Care Tool:  Bathing    Body parts bathed by patient: Right arm   Body parts bathed by helper: Buttocks     Bathing assist Assist Level: Moderate Assistance - Patient 50 - 74%     Upper Body Dressing/Undressing Upper body dressing   What is the patient wearing?: Pull over shirt    Upper body assist Assist Level: Moderate Assistance - Patient 50 - 74%    Lower Body Dressing/Undressing Lower body dressing      What is the patient wearing?: Incontinence brief     Lower body assist Assist for lower body dressing: Maximal Assistance - Patient 25 - 49%     Toileting Toileting    Toileting assist Assist for toileting: 2 Helpers     Transfers Chair/bed transfer  Transfers assist     Chair/bed transfer assist level: Minimal Assistance - Patient > 75%     Locomotion Ambulation   Ambulation assist      Assist level: Moderate Assistance - Patient 50 - 74% Assistive device: Hand held assist Max distance: 300'   Walk 10 feet activity   Assist     Assist level: Moderate Assistance - Patient - 50 - 74% Assistive device: Hand held assist  Walk 50 feet activity   Assist    Assist level: Moderate Assistance - Patient - 50 - 74% Assistive device: Hand held assist    Walk 150 feet activity   Assist    Assist level: Moderate Assistance - Patient - 50 - 74% Assistive device: Hand held assist    Walk 10 feet on uneven surface  activity   Assist Walk 10 feet on uneven surfaces activity did not occur: Safety/medical concerns         Wheelchair     Assist Is the patient using a wheelchair?: No             Wheelchair 50 feet with 2 turns activity    Assist            Wheelchair 150 feet activity     Assist          Blood pressure 128/86, pulse 80, temperature 98.2 F (36.8 C), temperature source Oral, resp. rate 18, height 5' 6"  (1.676 m), weight 96.9 kg, SpO2 100 %.   Medical Problem List and  Plan: 1.  Decline in ADL, mobility , and cognition secondary to anoxic brain injury             -patient may  shower             -ELOS/Goals: 18-21d  -Continue CIR therapies including PT, OT, and SLP. Interdisciplinary team conference today to discuss goals, barriers to discharge, and dc planning.     2.  Antithrombotics: -DVT/anticoagulation:  Pharmaceutical: Lovenox             -antiplatelet therapy: N/A 3. Pain Management: Tylenol prn.  4. Mood: LCSW to follow for evaluation and support.              -antipsychotic agents:  -  Risperdal scheduled bid    -9/27 continue risperdal 0.74m bid, depakote   [id number]   -enclosure bed required for safety 5. Neuropsych: This patient is not capable of making decisions on her own behalf. 6. Skin/Wound Care:  Monitor abdominal wound for healing.             --routine pressure relief measures.   7. Fluids/Electrolytes/Nutrition: encourage PO .     -protein supp for low albumin  8. Cellulitis abdomen: On Keflex --through 9/27. Continue to monitor for healing.  9. T2DM: Hgb A1c-6.9. Continue insulin glargine ---transition to trulicity at discharge?             --CBG (last 3)  Recent Labs    01/09/21 2110 01/10/21 0611 01/10/21 1138  GLUCAP 119* 165* 137*    9/27  continue 10u semglee in pm, increased am dose to 14u for higher PM readings 10. HTN: Monitor BP tid--continue Cozaar 11. SVT/NSVT: On coreg and amiodarone bid for rate control.   12. Dysphagia:   -continue D3, thins for now     LOS: 8 days A FACE TO FACE EVALUATION WAS PERFORMED  ZaMeredith Staggers/27/2022, 11:49 AM

## 2021-01-10 NOTE — Progress Notes (Signed)
Physical Therapy TBI Note  Patient Details  Name: Courtney Davies MRN: [id number] Date of Birth: [birth year]  Today's Date: 01/10/2021 PT Individual Time: 3953-2023 PT Individual Time Calculation (min): 55 min   Short Term Goals: Week 1:  PT Short Term Goal 1 (Week 1): Pt will perform bed mobility with minA. PT Short Term Goal 2 (Week 1): Pt will perform bed to chair transfer with minA. PT Short Term Goal 3 (Week 1): Pt will ambulate x100' with minA. PT Short Term Goal 4 (Week 1): Pt will complete balance assessment.  Skilled Therapeutic Interventions/Progress Updates:     Pt received curled up at foot of Posey bed. Agreeable to therapy and no complaint of pain. Pt eager to get out of bed and participate in therapy. Supine to sit with supervision and cues for positioning at EOB for safety. Sit to stand with CGA/minA and cues for body mechanics. Pt ambulates x300' with CGA/minA for increased postural sway, especially when distracted. Pt performs Nustep for 5:00 at workload of 4 with average steps per minute ~40. PT cues pt to perform for duration of song and pt is able to recall instruction without cueing. Performed to test pt's attention to task as well as strength and endurance training. Pt attempts to perform BITS system but is unable due to malfunctioning system. Pt verbalizes need to use restroom and ambulates x300' with CGA/minA back to room, with PT providing mod cues to locate restroom and toilet, as pt reaches for several objects in attempt to sit down. Pt is continent of urine and requires minA for stand from toilet and min cueing for pericare. Pt washes hands with cueing for use of soap and drying with paper towel.  Pt ambulates additional x400' following. Pt left seated in tilt in space with alarm intact and all needs within reach  Therapy Documentation Precautions:  Precautions Precautions: Fall Precaution Comments: incontinence, abdominal wound (from PEG tube) Restrictions Weight  Bearing Restrictions: No Other Position/Activity Restrictions: posey waist restraint, all bed rails Agitated Behavior Scale: TBI Observation Details Observation Environment: room Start of observation period - Date: 01/10/21 Start of observation period - Time: 1300 End of observation period - Date: 01/10/21 End of observation period - Time: 1400 Agitated Behavior Scale (DO NOT LEAVE BLANKS) Short attention span, easy distractibility, inability to concentrate: Present to a moderate degree Impulsive, impatient, low tolerance for pain or frustration: Present to a moderate degree Uncooperative, resistant to care, demanding: Absent Violent and/or threatening violence toward people or property: Absent Explosive and/or unpredictable anger: Absent Rocking, rubbing, moaning, or other self-stimulating behavior: Absent Pulling at tubes, restraints, etc.: Absent Wandering from treatment areas: Present to a slight degree Restlessness, pacing, excessive movement: Present to a slight degree Repetitive behaviors, motor, and/or verbal: Present to a slight degree Rapid, loud, or excessive talking: Present to a slight degree Sudden changes of mood: Present to a slight degree Easily initiated or excessive crying and/or laughter: Present to a slight degree Self-abusiveness, physical and/or verbal: Absent Agitated behavior scale total score: 24   Therapy/Group: Individual Therapy  Breck Coons, PT, DPT 01/10/2021, 4:31 PM

## 2021-01-10 NOTE — Progress Notes (Signed)
Speech Language Pathology Weekly Progress and Session Note  Patient Details  Name: Courtney Davies MRN: [id number] Date of Birth: 1976-01-10  Beginning of progress report period: January 02, 2021 End of progress report period: January 10, 2021  Today's Date: 01/10/2021 SLP Individual Time: 9983-3825 SLP Individual Time Calculation (min): 30 min  Short Term Goals: Week 1: SLP Short Term Goal 1 (Week 1): Patient will demonstrate efficient mastication with complete oral clearance with trials of Dys. 2 textures over 2 sessions with Min verbal cues prior to upgrade. SLP Short Term Goal 1 - Progress (Week 1): Discontinued (comment) SLP Short Term Goal 2 (Week 1): Patient will utilize external memory aids for recall to place, date and situation with Mod verbal and visual cues. SLP Short Term Goal 2 - Progress (Week 1): Not met SLP Short Term Goal 3 (Week 1): Patient will demonstrate functional problem solving for basic and familiar tasks with Mod verbal cues. SLP Short Term Goal 3 - Progress (Week 1): Not met SLP Short Term Goal 4 (Week 1): Patient will demonstrate sustained attention to functional tasks for 15 minutes with Mod verbal cues for redirection. SLP Short Term Goal 4 - Progress (Week 1): Not met SLP Short Term Goal 5 (Week 1): Patient will utilize word-finding strategies at the sentence level with Mod multimodal cues. SLP Short Term Goal 5 - Progress (Week 1): Not met    New Short Term Goals: Week 2: SLP Short Term Goal 1 (Week 2): Patient will demonstrate efficient mastication with complete oral clearance with trials of regular textures over 2 sessions with Min verbal cues prior to upgrade. SLP Short Term Goal 2 (Week 2): Patient will utilize external memory aids for recall to place, date and situation with Mod verbal and visual cues. SLP Short Term Goal 3 (Week 2): Patient will demonstrate functional problem solving for basic and familiar tasks with Mod verbal cues. SLP Short  Term Goal 4 (Week 2): Patient will demonstrate sustained attention to functional tasks for 15 minutes with Mod verbal cues for redirection. SLP Short Term Goal 5 (Week 2): Patient will utilize word-finding strategies at the word and phrase level with Mod multimodal cues.  Weekly Progress Updates: Patient has made slow and inconsistent gains and has not met any STGs this reporting period. Currently, patient is consuming Dys. 3 textures with thin liquids with intermittent overt s/s of aspiration with Mod-Max A multimodal cues needed for use of small bites/sips and a slow rate of self-feeding to maximize safety. Patient also requires overall Max A multimodal cues to complete functional and familiar tasks safely in regards to sustained attention, basic problem solving, safety awareness and recall of orientation information with use of external aids. Patient also demonstrates moderate word-finding deficis which impacts naming and overall ability to express wants/needs with intermittent awareness of errors noted. Patient's biggest barriers to progress continue to be inconsistent verbal agitation and ongoing confusion. However, during yesterday's session, patient was more cooperative, calm and agreeable throughout treatment session. Patient and family education ongoing. Patient would benefit from continued skilled SLP intervention to maximize her swallowing and cognitive-linguistic functioning prior to discharge.      Intensity: Minumum of 1-2 x/day, 30 to 90 minutes Frequency: 3 to 5 out of 7 days Duration/Length of Stay: 2 weeks Treatment/Interventions: Cognitive remediation/compensation;Dysphagia/aspiration precaution training;Internal/external aids;Cueing hierarchy;Environmental controls;Therapeutic Activities;Functional tasks;Patient/family education;Speech/Language facilitation   Daily Session  Skilled Therapeutic Interventions:  Skilled treatment session focused on cognitive goals. Upon arrival,  patient was upright ambulating around  the room with the NT. Patient only had a shirt on and was hyperverbal and disorganized within tasks. With mod verbal cues, patient able to donn a new brief and pants safely. Patient requested food but declined all options. Patient with language of confusion and confabulation regarding recent therapy events, etc. SLP provided education regarding current deficits, external aids for recall and overall emotional support. Patient was tearful but cooperative and easily redirectable throughout. Patient left upright at RN station with alarm on and all needs within reach. Continue with current plan of care.      Pain No/Denies Pain   Therapy/Group: Individual Therapy  Kendan Cornforth 01/10/2021, 6:24 AM

## 2021-01-11 LAB — GLUCOSE, CAPILLARY
Glucose-Capillary: 83 mg/dL (ref 70–99)
Glucose-Capillary: 127 mg/dL — ABNORMAL HIGH (ref 70–99)
Glucose-Capillary: 92 mg/dL (ref 70–99)
Glucose-Capillary: 146 mg/dL — ABNORMAL HIGH (ref 70–99)

## 2021-01-11 MED ORDER — QUETIAPINE FUMARATE 50 MG PO TABS
50.0000 mg | ORAL_TABLET | Freq: Every day | ORAL | Status: DC
  Administered 2021-01-11 – 2021-01-16 (×6): 50 mg via ORAL
  Filled 2021-01-11 (×6): qty 1

## 2021-01-11 MED ORDER — QUETIAPINE FUMARATE 50 MG PO TABS
100.0000 mg | ORAL_TABLET | Freq: Every day | ORAL | Status: DC
  Administered 2021-01-11 – 2021-01-23 (×13): 100 mg via ORAL
  Filled 2021-01-11 (×13): qty 2

## 2021-01-11 NOTE — Progress Notes (Signed)
Speech Language Pathology Daily Session Note  Patient Details  Name: Courtney Davies MRN: [id number] Date of Birth: 10/13/1975  Today's Date: 01/11/2021 SLP Individual Time: 0865-7846 SLP Individual Time Calculation (min): 45 min  Short Term Goals: Week 2: SLP Short Term Goal 1 (Week 2): Patient will demonstrate efficient mastication with complete oral clearance with trials of regular textures over 2 sessions with Min verbal cues prior to upgrade. SLP Short Term Goal 2 (Week 2): Patient will utilize external memory aids for recall to place, date and situation with Mod verbal and visual cues. SLP Short Term Goal 3 (Week 2): Patient will demonstrate functional problem solving for basic and familiar tasks with Mod verbal cues. SLP Short Term Goal 4 (Week 2): Patient will demonstrate sustained attention to functional tasks for 15 minutes with Mod verbal cues for redirection. SLP Short Term Goal 5 (Week 2): Patient will utilize word-finding strategies at the word and phrase level with Mod multimodal cues.  Skilled Therapeutic Interventions: Skilled treatment session focused on dysphagia and cognitive goals. Upon arrival, patient was awake in the enclosure bed. Patient requesting a shower and provided education that a shower will happen during OT session. However, patient required frequent reminders of this throughout. Patient also perseverative on using the bathroom due to constipation. RN aware. Patient would constantly requested to use the commode and would only site for ~30-60 seconds prior to standing and reporting she couldn't go. Patient requested her breakfast and consumed Dys. 3 textures with thin liquids without overt s/s of aspiration. However, Max verbal cues were needed for use of small bites and a slow rate. Frequent word-finding deficits were noted throughout functional conversation. Overall, patient remains extremely distracted and disorganized with structured and informal tasks. Patient  left in enclosure bed with alarm on and all needs within reach. Continue with current plan of care.      Pain No/Denies Pain   Therapy/Group: Individual Therapy  Maryellen Dowdle 01/11/2021, 11:24 AM

## 2021-01-11 NOTE — Progress Notes (Signed)
Physical Therapy Weekly Progress Note  Patient Details  Name: Courtney Davies MRN: [id number] Date of Birth: 1976/03/17  Beginning of progress report period: January 03, 2021 End of progress report period: January 11, 2021  Today's Date: 01/11/2021 PT Individual Time: 0950-1050 PT Individual Time Calculation (min): 60 min  and Today's Date: 01/11/2021 PT Missed Time: 15 Minutes Missed Time Reason: Patient fatigue  Patient has met 3 of 4 short term goals.  Pt is progressing well toward mobility goals, improving independence with bed mobility, transfers, balance and ambulation. Pt has demonstrated improvements in cognition and awareness, largely contributing to improvements in mobility and safety. With fatigue pt's motor planning, initiation, and attention to task suffer and pt can require heavy assistance, but these periods of decline in safety and mobility are becoming less frequent. Pt will benefit from family education closer to discharge, though PT does provide unscheduled education to pt's mother when she is present in the room.  Patient continues to demonstrate the following deficits muscle weakness, decreased cardiorespiratoy endurance, decreased initiation, decreased attention, decreased awareness, decreased problem solving, decreased safety awareness, decreased memory, and delayed processing, and decreased standing balance, decreased postural control, and decreased balance strategies and therefore will continue to benefit from skilled PT intervention to increase functional independence with mobility.  Patient progressing toward long term goals..  Continue plan of care.  PT Short Term Goals Week 1:  PT Short Term Goal 1 (Week 1): Pt will perform bed mobility with minA. PT Short Term Goal 1 - Progress (Week 1): Met PT Short Term Goal 2 (Week 1): Pt will perform bed to chair transfer with minA. PT Short Term Goal 2 - Progress (Week 1): Met PT Short Term Goal 3 (Week 1): Pt will  ambulate x100' with minA. PT Short Term Goal 3 - Progress (Week 1): Met PT Short Term Goal 4 (Week 1): Pt will complete balance assessment. PT Short Term Goal 4 - Progress (Week 1): Progressing toward goal Week 2:  PT Short Term Goal 1 (Week 2): Pt will ambulate 300' with CGA. PT Short Term Goal 2 (Week 2): Pt will consistently perform bed mobility with supervision. PT Short Term Goal 3 (Week 2): Pt will complete balance assessment.  Skilled Therapeutic Interventions/Progress Updates:  Ambulation/gait training;Community reintegration;DME/adaptive equipment instruction;Psychosocial support;Neuromuscular re-education;Stair training;UE/LE Strength taining/ROM;Wheelchair propulsion/positioning;Balance/vestibular training;Functional electrical stimulation;Discharge planning;Pain management;Skin care/wound management;Therapeutic Activities;UE/LE Coordination activities;Cognitive remediation/compensation;Disease management/prevention;Functional mobility training;Patient/family education;Splinting/orthotics;Therapeutic Exercise;Visual/perceptual remediation/compensation   Pt received supine in bed and agrees to therapy. No complaint of pain. Pt requesting to take shower. Supine to sit with verbal cues for sequencing and positioning at EOB for safety. PT cues pt to stay seated until commanded otherwise as pt is very impulsive. Pt stands with CGA and tactile cues for body mechanics, then ambulates around room to gather items necessary for shower, including clean pants and shift. Pt needs mod cueing to retrieve correct items and to identify items when they are shown to pt. Pt then sits on toilet with minA for transfer and cues for sequencing. Pt undresses while seated on toilet and then transfers to shower seat with CGA and cues for positioning and safety. Throughout shower, pt frequently stands despite PT cues to remain seated for safety. PT provides very close guarding of pt for safety as it becomes difficult to  keep pt seated. Pt is very thorough with bathing and balances in single legs stance multiple times, with PT cueing to use grab bars for safety. Following, pt dries self off with  towel and minA from PT, then dresses with setup assistance and cues to sit for donning pants. Pt stands and brushes teeth in standing with cues for correct item identification. Pt then ambulates to virtual apartment and cued to "sit in recliner". Pt requires mod cues to find and identify recliner and is able to sit and stand from recliner with CGA. Pt ambulates 200' back to room with CGA and cues for upright gaze to improve posture and balance, and navigation through crowded environment. Pt requests to return to bed to nap and misses 15 minutes of skilled PT due to fatigue. Pt left supine in bed with alarm intact and all needs within reach.  Therapy Documentation Precautions:  Precautions Precautions: Fall Precaution Comments: incontinence, abdominal wound (from PEG tube) Restrictions Weight Bearing Restrictions: No Other Position/Activity Restrictions: posey waist restraint, all bed rails   Therapy/Group: Individual Therapy  Breck Coons, PT, DPT 01/11/2021, 12:42 PM

## 2021-01-11 NOTE — Progress Notes (Signed)
Physical Therapy Session Note  Patient Details  Name: Courtney Davies MRN: [id number] Date of Birth: [birth year]  Today's Date: 01/11/2021 PT Individual Time: 1410-1510 PT Individual Time Calculation (min): 60 min   Short Term Goals: Week 1:  PT Short Term Goal 1 (Week 1): Pt will perform bed mobility with minA. PT Short Term Goal 1 - Progress (Week 1): Met PT Short Term Goal 2 (Week 1): Pt will perform bed to chair transfer with minA. PT Short Term Goal 2 - Progress (Week 1): Met PT Short Term Goal 3 (Week 1): Pt will ambulate x100' with minA. PT Short Term Goal 3 - Progress (Week 1): Met PT Short Term Goal 4 (Week 1): Pt will complete balance assessment. PT Short Term Goal 4 - Progress (Week 1): Progressing toward goal  Skilled Therapeutic Interventions/Progress Updates: Pt presented at bedside with NT present as pt had just returned from bathroom. Pt's family in room. Per pt "backed up" and had been trying to use bathroom all day. Pt agreeable to therapy as PTA expressed that ambulation and activity may help with motility. Pt ambulated through unit and to 4N tower with family and demonstrated near equal step length good cadence and ability to maintain straight path as turning head side to side observing through windows in tower. Pt took brief rest break in 4N waiting area for recovery per PTA suggestion. Pt ambulated back to unit in same manner. Pt then participated in NuStep L5 x ~58mnutes (listening to music) while maintaining approx 40-50 SPM. Pt only required x 1 redirection to maintain task as PTA told pt to stop at second song. Pt then was don glove and clean NuStep handles, screen, and seat with supervision and was able to perform reaching and safe negotiation around object. Pt was then able to be directed to participate in [place]etti Balance and Gait assessment. Pt did require intermittent redirection during this task but was able to appropriately follow direction with mod cues. Pt scored  23/28 (13/16 on Balance portion and 10/12 for gait assessment with 19-23 indicative of moderate risk of falls). At completion of task pt then participated ball toss with beach ball and participated in dual task activity of recalling states then counting to 10 then x 10s to 100 for dual task. Pt was able to sustain activity however did have difficulty recalling states. [place] was able [place] was only able to recall 4 states before repeating. Pt then participated in ball toss while ambulating to rehab gym with PTA attempting to engage pt in meaningful conversation. With fatigue pt noted to have increased difficulty sustaining dual task activity. In rehab gym pt participated in ascending/descending x 8 steps then participated in use of rebounder with pt easily following PTA instructions. After rebounder pt indicated increased fatigue and requesting to return to room. Pt ambulated back to room at supervision level and returned to enclosure bed. Pt left in bed closed and secure with call bell within reach and family present.    Therapy Documentation Precautions:  Precautions Precautions: Fall Precaution Comments: incontinence, abdominal wound (from PEG tube) Restrictions Weight Bearing Restrictions: No Other Position/Activity Restrictions: posey waist restraint, all bed rails General: PT Amount of Missed Time (min): 15 Minutes PT Missed Treatment Reason: Toileting;Patient fatigue Vital Signs: Therapy Vitals Temp: 98.6 F (37 C) Temp Source: Oral Pulse Rate: 77 Resp: 18 BP: 103/79 Patient Position (if appropriate): Lying Oxygen Therapy SpO2: 100 % O2 Device: Room Air  Pain:   Mobility:   Locomotion :    Trunk/Postural Assessment :    Balance:   Exercises:   Other Treatments:      Therapy/Group: Individual Therapy  Shaquil Aldana 01/11/2021, 4:08 PM

## 2021-01-11 NOTE — Progress Notes (Signed)
PROGRESS NOTE   Subjective/Complaints: Pt with "busy" night per nursing. Required ativan to settle down. Up in bathroom this morning with staff.   ROS: Limited due to cognitive/behavioral   Objective:   No results found. Recent Labs    01/09/21 0703  WBC 5.8  HGB 9.8*  HCT 31.8*  PLT 258    Recent Labs    01/09/21 0703  NA 136  K 4.1  CL 104  CO2 25  GLUCOSE 166*  BUN 9  CREATININE 0.82  CALCIUM 8.9     Intake/Output Summary (Last 24 hours) at 01/11/2021 0827 Last data filed at 01/10/2021 1900 Gross per 24 hour  Intake 708 ml  Output --  Net 708 ml        Physical Exam: Vital Signs Blood pressure 92/67, pulse 71, temperature 97.9 F (36.6 C), resp. rate 18, height 5' 6"  (1.676 m), weight 96.9 kg, SpO2 100 %.  Constitutional: No distress . Vital signs reviewed. HEENT: NCAT, EOMI, oral membranes moist Neck: supple Cardiovascular: RRR without murmur. No JVD    Respiratory/Chest: CTA Bilaterally without wheezes or rales. Normal effort    GI/Abdomen: BS +, non-tender, non-distended Ext: no clubbing, cyanosis, or edema Psych: impulsive, restless Skin: Clean and intact without signs of breakdown Neuro:  Pt alert.  STM deficits, lacks insight and awareness. Distracted. Moves all 4's, normal sensation Musculoskeletal: Full ROM, No pain with AROM or PROM in the neck, trunk, or extremities.       Assessment/Plan: 1. Functional deficits which require 3+ hours per day of interdisciplinary therapy in a comprehensive inpatient rehab setting. Physiatrist is providing close team supervision and 24 hour management of active medical problems listed below. Physiatrist and rehab team continue to assess barriers to discharge/monitor patient progress toward functional and medical goals  Care Tool:  Bathing    Body parts bathed by patient: Right arm   Body parts bathed by helper: Buttocks     Bathing assist  Assist Level: Moderate Assistance - Patient 50 - 74%     Upper Body Dressing/Undressing Upper body dressing   What is the patient wearing?: Pull over shirt    Upper body assist Assist Level: Moderate Assistance - Patient 50 - 74%    Lower Body Dressing/Undressing Lower body dressing      What is the patient wearing?: Incontinence brief     Lower body assist Assist for lower body dressing: Maximal Assistance - Patient 25 - 49%     Toileting Toileting    Toileting assist Assist for toileting: 2 Helpers     Transfers Chair/bed transfer  Transfers assist     Chair/bed transfer assist level: Minimal Assistance - Patient > 75%     Locomotion Ambulation   Ambulation assist      Assist level: Moderate Assistance - Patient 50 - 74% Assistive device: Hand held assist Max distance: 300'   Walk 10 feet activity   Assist     Assist level: Moderate Assistance - Patient - 50 - 74% Assistive device: Hand held assist   Walk 50 feet activity   Assist    Assist level: Moderate Assistance - Patient - 50 - 74% Assistive  device: Hand held assist    Walk 150 feet activity   Assist    Assist level: Moderate Assistance - Patient - 50 - 74% Assistive device: Hand held assist    Walk 10 feet on uneven surface  activity   Assist Walk 10 feet on uneven surfaces activity did not occur: Safety/medical concerns         Wheelchair     Assist Is the patient using a wheelchair?: No             Wheelchair 50 feet with 2 turns activity    Assist            Wheelchair 150 feet activity     Assist          Blood pressure 92/67, pulse 71, temperature 97.9 F (36.6 C), resp. rate 18, height 5' 6"  (1.676 m), weight 96.9 kg, SpO2 100 %.   Medical Problem List and Plan: 1.  Decline in ADL, mobility , and cognition secondary to anoxic brain injury             -patient may  shower             -ELOS/Goals: 18-21d  -Continue CIR therapies  including PT, OT, and SLP  2.  Antithrombotics: -DVT/anticoagulation:  Pharmaceutical: Lovenox             -antiplatelet therapy: N/A 3. Pain Management: Tylenol prn.  4. Mood: LCSW to follow for evaluation and support.              -antipsychotic agents:  - seroquel   -9/28 ongoing restlessness and agitation   -dc risperdal   -trial of seroquel, 72m qam and 1035mqpm   -continue vpa  -enclosure bed still required for safety  -family education 5. Neuropsych: This patient is not capable of making decisions on her own behalf. 6. Skin/Wound Care:  Monitor abdominal wound for healing.             --routine pressure relief measures.   7. Fluids/Electrolytes/Nutrition: encourage PO .     -protein supp for low albumin  8. Cellulitis abdomen: On Keflex --through 9/27. Continue to monitor for healing.  9. T2DM: Hgb A1c-6.9. Continue insulin glargine ---transition to trulicity at discharge?             --CBG (last 3)  Recent Labs    01/10/21 1138 01/10/21 1657 01/10/21 1957  GLUCAP 137* 163* 97    9/28  continue 10u semglee in pm,   14u in am ---improving 10. HTN: Monitor BP tid--continue Cozaar 11. SVT/NSVT: On coreg and amiodarone bid for rate control.   12. Dysphagia:   -continue D3, thins for now     LOS: 9 days A FACE TO FACE EVALUATION WAS PERFORMED  ZaMeredith Staggers/28/2022, 8:27 AM

## 2021-01-12 LAB — GLUCOSE, CAPILLARY
Glucose-Capillary: 130 mg/dL — ABNORMAL HIGH (ref 70–99)
Glucose-Capillary: 114 mg/dL — ABNORMAL HIGH (ref 70–99)
Glucose-Capillary: 215 mg/dL — ABNORMAL HIGH (ref 70–99)
Glucose-Capillary: 106 mg/dL — ABNORMAL HIGH (ref 70–99)
Glucose-Capillary: 133 mg/dL — ABNORMAL HIGH (ref 70–99)

## 2021-01-12 NOTE — Progress Notes (Signed)
PROGRESS NOTE   Subjective/Complaints: Pt says she slept. Sleep chart not completed. Pt wonders why there are things on floor in her room. Hungry for breakfast this am  ROS: Limited due to cognitive/behavioral   Objective:   No results found. No results for input(s): WBC, HGB, HCT, PLT in the last 72 hours.   No results for input(s): NA, K, CL, CO2, GLUCOSE, BUN, CREATININE, CALCIUM in the last 72 hours.    Intake/Output Summary (Last 24 hours) at 01/12/2021 0928 Last data filed at 01/12/2021 0923 Gross per 24 hour  Intake 240 ml  Output --  Net 240 ml        Physical Exam: Vital Signs Blood pressure 128/83, pulse (!) 107, temperature 99 F (37.2 C), temperature source Oral, resp. rate 17, height 5' 6"  (1.676 m), weight 96.9 kg, SpO2 98 %.  Constitutional: No distress . Vital signs reviewed. HEENT: NCAT, EOMI, oral membranes moist Neck: supple Cardiovascular: RRR without murmur. No JVD    Respiratory/Chest: CTA Bilaterally without wheezes or rales. Normal effort    GI/Abdomen: BS +, non-tender, non-distended Ext: no clubbing, cyanosis, or edema Psych: still impulsive but a little less aggressive today and more redirectable Skin: Clean and intact without signs of breakdown Neuro:  still with impairedd but improving insight and awareness, . Distracted. Moves all 4's, normal sensation Musculoskeletal: Full ROM, No pain with AROM or PROM in the neck, trunk, or extremities.       Assessment/Plan: 1. Functional deficits which require 3+ hours per day of interdisciplinary therapy in a comprehensive inpatient rehab setting. Physiatrist is providing close team supervision and 24 hour management of active medical problems listed below. Physiatrist and rehab team continue to assess barriers to discharge/monitor patient progress toward functional and medical goals  Care Tool:  Bathing    Body parts bathed by patient:  Right arm   Body parts bathed by helper: Buttocks     Bathing assist Assist Level: Moderate Assistance - Patient 50 - 74%     Upper Body Dressing/Undressing Upper body dressing   What is the patient wearing?: Pull over shirt    Upper body assist Assist Level: Moderate Assistance - Patient 50 - 74%    Lower Body Dressing/Undressing Lower body dressing      What is the patient wearing?: Incontinence brief     Lower body assist Assist for lower body dressing: Maximal Assistance - Patient 25 - 49%     Toileting Toileting    Toileting assist Assist for toileting: 2 Helpers     Transfers Chair/bed transfer  Transfers assist     Chair/bed transfer assist level: Minimal Assistance - Patient > 75%     Locomotion Ambulation   Ambulation assist      Assist level: Moderate Assistance - Patient 50 - 74% Assistive device: Hand held assist Max distance: 300'   Walk 10 feet activity   Assist     Assist level: Moderate Assistance - Patient - 50 - 74% Assistive device: Hand held assist   Walk 50 feet activity   Assist    Assist level: Moderate Assistance - Patient - 50 - 74% Assistive device: Hand held assist  Walk 150 feet activity   Assist    Assist level: Moderate Assistance - Patient - 50 - 74% Assistive device: Hand held assist    Walk 10 feet on uneven surface  activity   Assist Walk 10 feet on uneven surfaces activity did not occur: Safety/medical concerns         Wheelchair     Assist Is the patient using a wheelchair?: No             Wheelchair 50 feet with 2 turns activity    Assist            Wheelchair 150 feet activity     Assist          Blood pressure 128/83, pulse (!) 107, temperature 99 F (37.2 C), temperature source Oral, resp. rate 17, height 5' 6"  (1.676 m), weight 96.9 kg, SpO2 98 %.   Medical Problem List and Plan: 1.  Decline in ADL, mobility , and cognition secondary to anoxic brain  injury             -patient may  shower             -ELOS/Goals: 18-21d  -Continue CIR therapies including PT, OT, and SLP  2.  Antithrombotics: -DVT/anticoagulation:  Pharmaceutical: Lovenox             -antiplatelet therapy: N/A 3. Pain Management: Tylenol prn.  4. Mood: LCSW to follow for evaluation and support.              -antipsychotic agents:  - seroquel   -9/29   restlessness and agitation   -off risperdal   -continue trial of seroquel, 40m qam and 1068mqpm   -continue vpa   -seems under a bit more control today  -enclosure bed still required for safety  -family education 5. Neuropsych: This patient is not capable of making decisions on her own behalf. 6. Skin/Wound Care:  Monitor abdominal wound for healing.             --routine pressure relief measures.   7. Fluids/Electrolytes/Nutrition: encourage PO .     -protein supp for low albumin  8. Cellulitis abdomen: On Keflex --through 9/27. Continue to monitor for healing.  9. T2DM: Hgb A1c-6.9. Continue insulin glargine ---transition to trulicity at discharge?             --CBG (last 3)  Recent Labs    01/11/21 1653 01/11/21 2159 01/12/21 0656  GLUCAP 92 146* 130*    9/29  continue 10u semglee in pm and 14u in am ---improving control 10. HTN: Monitor BP tid--continue Cozaar 11. SVT/NSVT: On coreg and amiodarone bid for rate control.   12. Dysphagia:   -continue D3, thins for now     LOS: 10 days A FACE TO FACE EVALUATION WAS PERFORMED  ZaMeredith Staggers/29/2022, 9:28 AM

## 2021-01-12 NOTE — Progress Notes (Signed)
Occupational Therapy Session Note  Patient Details  Name: Courtney Davies MRN: [id number] Date of Birth: Sep 03, 1975  Today's Date: 01/12/2021 OT Individual Time: 1512-1550 OT Individual Time Calculation (min): 38 min    Short Term Goals: Week 1:  OT Short Term Goal 1 (Week 1): Pt will be A&Ox4 with external cues PRN OT Short Term Goal 1 - Progress (Week 1): Progressing toward goal OT Short Term Goal 2 (Week 1): Pt will participate in ADLs 3 days in a row without agitation OT Short Term Goal 2 - Progress (Week 1): Met OT Short Term Goal 3 (Week 1): Pt will complete bed mobility with close (S) OT Short Term Goal 3 - Progress (Week 1): Met OT Short Term Goal 4 (Week 1): Pt will bathe UB with min cuing for attention to task OT Short Term Goal 4 - Progress (Week 1): Met  Skilled Therapeutic Interventions/Progress Updates:     Pt received in enclosure bed with mom present pt laying down and bed unzipped.Pt with no pain reported but cold after increased time to arouse pt.  Therapuetic activity: Pt completes functional mobility to/from all tx destinations with CGA with no AD.  BITS activities seated (pt preference) Sequence 1-20 visual scanning (increased difficulty locating upper L quadrant numbers) 1 min 11s 3.5 sec rxn time Visual pursuits rotation 1-20 sequence with all numbers centrally located inside circle: 39 secs 1.9 sec rxn time Trail making A assessment: 1 min 15s, 7 interruptions and 2 errors Maze assessment 31 seconds and 6 errors after 3 trials for full pt comprehension of task- second trial pt ignored top L corner of maze Attempted bell cancellation test- pt stating "those are NOT bells" Attempted memory sequence of words 2-3 word sequence and unable to complete Pt joking and jovial the entire time.  Negotiating ramp, curb and uneven terrain with CGA and VC for slowing pace/visual attention to curb step prior to stepping down. Return to room and pt initially agitated on walk  back wondering if mom was present in room. OT assured her she was there and pt directed to sit into TIS to talk in phone.   Pt left at end of session in bed with exit alarm on, call light in reach and all needs met   Therapy Documentation Precautions:  Precautions Precautions: Fall Precaution Comments: incontinence, abdominal wound (from PEG tube) Restrictions Weight Bearing Restrictions: No Other Position/Activity Restrictions: posey waist restraint, all bed rails       Therapy/Group: Individual Therapy  Tonny Branch 01/12/2021, 3:57 PM

## 2021-01-12 NOTE — Progress Notes (Signed)
Physical Therapy Session Note  Patient Details  Name: Courtney Davies MRN: [id number] Date of Birth: 29-Jun-1975  Today's Date: 01/12/2021 PT Individual Time: 0930-0950 and 1005-1030 PT Individual Time Calculation (min): 20 min and 25 min   Short Term Goals: Week 2:  PT Short Term Goal 1 (Week 2): Pt will ambulate 300' with CGA. PT Short Term Goal 2 (Week 2): Pt will consistently perform bed mobility with supervision. PT Short Term Goal 3 (Week 2): Pt will complete balance assessment.  Skilled Therapeutic Interventions/Progress Updates:     Patient in chair in the room when received from [place], [place] upon PT arrival. Patient alert and agreeable to PT session. Patient denied pain during session, reports need for BM and concerns about constipation.   Educated patient about benefits of exercise and mobility to improve motility. Patient in agreement and ambulated to the Day room with CGA, noted improved balance this session compared to Monday when this PT last saw her. Initiated squats with cues to touch mat table, but not to sit x10. Patient performed 1 squat with sit before staning and 1 squat following PT instructions with supervision. Patient reported bowl urgency and ambulating quickly back to the room with CGA-close supervision with cues to locate the bathroom in her room.   Patient was unsuccessful with toileting x3 going back and forth from the toilet to the room. Patient perseverative about constipation and visibly upset about this. RN made aware and agreeable to provide patient with an enema at this time. Patient returned to lying in the bed with supervision in preparation for nursing care. Patient missed 15 min of skilled PT due to nursing care, RN made aware. Will attempt to make-up missed time as able.     PT returned to continue session and patient reports a large BM with the nurse, but indicated urgency again. Patient ambulated to the bathroom with close supervision. Patient was  incontinent and continent of bowl during toileting. Patient insistent that she would perform peri-care with a wash cloth. Patient impulsively stood and took wash cloths from the shelf and dropped them in the clean toilet water, as PT had already flushed the toilet. Attempted multiple times to redirect patient to taking clean wash cloths to the sink, however, patient impulsive and resistive to redirection and performed peri-care with wet wash cloths from the toilet followed with a dry towel. Patient demonstrates very poor awareness and reasoning with verbose and confabulating language with task.   NT retrieved paper scrubs and patient donned them with CGA in standing, demonstrating adequate SLS with single upper extremity support. Directed patient to the sink with supervision and patient washed her face and brushed her teeth in standing with supervision for balance and mod-max cues for locating items needed for tasks and sequencing.   Patient very appreciative of PT's assistance during session and relieved from multiple BMs. Patient ambulated back to the bed and returned to lying with supervision.  Patient in secured enclosure bed at end of session with breaks locked, Telesitter in place, and all needs within reach.   Therapy Documentation Precautions:  Precautions Precautions: Fall Precaution Comments: incontinence, abdominal wound (from PEG tube) Restrictions Weight Bearing Restrictions: No Other Position/Activity Restrictions: posey waist restraint, all bed rails General: PT Amount of Missed Time (min): 15 Minutes PT Missed Treatment Reason: Nursing care    Therapy/Group: Individual Therapy  Roseanna Koplin L Leaner Morici PT, DPT  01/12/2021, 12:00 PM

## 2021-01-12 NOTE — Progress Notes (Signed)
Occupational Therapy Session Note  Patient Details  Name: Courtney Davies MRN: [id number] Date of Birth: 07-Nov-1975  Today's Date: 01/12/2021 OT Individual Time: 1300-1415 OT Individual Time Calculation (min): 75 min    Short Term Goals: Week 2:  OT Short Term Goal 3 (Week 2): Pt will be A&Ox3 with external cues PRN OT Short Term Goal 4 (Week 2): Patient will demonstrate increaesed safety awareness during BADLs with moderate instrutcional cues OT Short Term Goal 5 (Week 2): Patient will recall 2 tasks she completed during BADL session with min questioning cues  Skilled Therapeutic Interventions/Progress Updates:    Pt greeted semi-reclined in enclosure bed with mother present.  Pt requesting to shower this afternoon. Functional ambulation throughout session with CGA. Pt needed mod/max multimodal cues for safety awareness during BADL tasks. OT provided education on why OT was asking her to sit to perform LB ADLs, with pt stating she understood why and following OT commands. Pt very thorough with bathing tasks and needed cues to recall body parts she had already washed. Dressing from EOB with pt continuing to demonstrate very poor recall, forgetting she had already put lotion on one leg right after placing it. OT had pt write out everything we did in OT session to assist with recall. Pt ambulated to therapy gym and worked on problem solving and visiospatial skills using graded peg board puzzle. Pt did a great job with vertical and horizontal " T" pattern, but then had more difficulty with spatial representation on diagonal planes. Pt ambulated in hallway with CGA while OT provided orientation information regarding place and situation. Pt offered to sit in TIS wc at nurses station or return to bed. Pt stated she wanted to return to bed and take a nap before next therapy session. Once pt in enclusure bed, pt became increasingly agitated bc her mother had left and she did not want to stay in bed. OT able  to calm patient by providing drink and encouraged her to rest before her next therapist arrives. Pt left in enclosure bed with needs met.  Therapy Documentation Precautions:  Precautions Precautions: Fall Precaution Comments: incontinence, abdominal wound (from PEG tube) Restrictions Weight Bearing Restrictions: No Other Position/Activity Restrictions: posey waist restraint, all bed rails Pain:  Denies pain   Therapy/Group: Individual Therapy  Valma Cava 01/12/2021, 2:16 PM

## 2021-01-12 NOTE — Progress Notes (Signed)
Speech Language Pathology Daily Session Note  Patient Details  Name: Courtney Davies MRN: [id number] Date of Birth: Nov 02, 1975  Today's Date: 01/12/2021 SLP Individual Time: 0830-0930 SLP Individual Time Calculation (min): 60 min  Short Term Goals: Week 2: SLP Short Term Goal 1 (Week 2): Patient will demonstrate efficient mastication with complete oral clearance with trials of regular textures over 2 sessions with Min verbal cues prior to upgrade. SLP Short Term Goal 2 (Week 2): Patient will utilize external memory aids for recall to place, date and situation with Mod verbal and visual cues. SLP Short Term Goal 3 (Week 2): Patient will demonstrate functional problem solving for basic and familiar tasks with Mod verbal cues. SLP Short Term Goal 4 (Week 2): Patient will demonstrate sustained attention to functional tasks for 15 minutes with Mod verbal cues for redirection. SLP Short Term Goal 5 (Week 2): Patient will utilize word-finding strategies at the word and phrase level with Mod multimodal cues.  Skilled Therapeutic Interventions: Skilled treatment session focused on cognitive goals. Upon arrival, patient was awake while in the enclosure bed. Patient requested to eat breakfast. SLP focused session on organization and attention to tasks. Patient completed 75% of her breakfast meal with overall Min verbal cues for attention to task as patient perseverating on using the commode. Patient was able to complete breakfast prior to initial attempt to use the bathroom. However, throughout the remainder of the session, patient attempted to sit on commode X 4 without success and would only sit for ~30 seconds despite Max A multimodal cues. SLP also facilitated session by administering the [place]. Patient's expressive index was 24/50 and her receptive index was 46/50. Patient with awareness of difficulty regarding verbal expression and was intermittently frustrated. Patient  responsive to a combination of sentence completion and phonemic cues to assist in word-finding. Patient handed off to PT. Continue with current plan of care.      Pain Stomach discomfort due to constipation, RN aware   Therapy/Group: Individual Therapy  [name] 01/12/2021, 12:27 PM

## 2021-01-13 DIAGNOSIS — D62 Acute posthemorrhagic anemia: Secondary | ICD-10-CM

## 2021-01-13 DIAGNOSIS — R1310 Dysphagia, unspecified: Secondary | ICD-10-CM

## 2021-01-13 DIAGNOSIS — E1165 Type 2 diabetes mellitus with hyperglycemia: Secondary | ICD-10-CM

## 2021-01-13 DIAGNOSIS — I10 Essential (primary) hypertension: Secondary | ICD-10-CM

## 2021-01-13 LAB — GLUCOSE, CAPILLARY
Glucose-Capillary: 147 mg/dL — ABNORMAL HIGH (ref 70–99)
Glucose-Capillary: 88 mg/dL (ref 70–99)
Glucose-Capillary: 168 mg/dL — ABNORMAL HIGH (ref 70–99)
Glucose-Capillary: 111 mg/dL — ABNORMAL HIGH (ref 70–99)

## 2021-01-13 NOTE — Plan of Care (Signed)
  Problem: Safety: Goal: Non-violent Restraint(s) Outcome: Progressing   Problem: Consults Goal: RH BRAIN INJURY PATIENT EDUCATION Description: Description: See Patient Education module for eduction specifics Outcome: Progressing Goal: Skin Care Protocol Initiated - if Braden Score 18 or less Description: If consults are not indicated, leave blank or document N/A Outcome: Progressing Goal: Diabetes Guidelines if Diabetic/Glucose > 140 Description: If diabetic or lab glucose is > 140 mg/dl - Initiate Diabetes/Hyperglycemia Guidelines & Document Interventions  Outcome: Progressing   Problem: RH BOWEL ELIMINATION Goal: RH STG MANAGE BOWEL WITH ASSISTANCE Description: STG Manage Bowel with Min Assistance. Outcome: Progressing   Problem: RH BLADDER ELIMINATION Goal: RH STG MANAGE BLADDER WITH ASSISTANCE Description: STG Manage Bladder With Min Assistance Outcome: Progressing   Problem: RH SKIN INTEGRITY Goal: RH STG ABLE TO PERFORM INCISION/WOUND CARE W/ASSISTANCE Description: STG Able To Perform Incision/Wound Care With [employer]. Outcome: Progressing   Problem: RH SAFETY Goal: RH STG ADHERE TO SAFETY PRECAUTIONS W/ASSISTANCE/DEVICE Description: STG Adhere to Safety Precautions With Min Assistance/Device. Outcome: Progressing   Problem: RH COGNITION-NURSING Goal: RH STG USES MEMORY AIDS/STRATEGIES W/ASSIST TO PROBLEM SOLVE Description: STG Uses Memory Aids/Strategies With Min Assistance to Problem Solve. Outcome: Progressing Goal: RH STG ANTICIPATES NEEDS/CALLS FOR ASSIST W/ASSIST/CUES Description: STG Anticipates Needs/Calls for Assist With Min Assistance/Cues. Outcome: Progressing   Problem: RH PAIN MANAGEMENT Goal: RH STG PAIN MANAGED AT OR BELOW PT'S PAIN GOAL Description: < 3 on a 0-10 pain scale. Outcome: Progressing   Problem: RH KNOWLEDGE DEFICIT BRAIN INJURY Goal: RH STG INCREASE KNOWLEDGE OF SELF CARE AFTER BRAIN INJURY Description: Patient will  demonstrate knowledge on medication management, pain management, skin/wound care with educational materials and handouts provided by staff independently at discharge. Outcome: Progressing

## 2021-01-13 NOTE — Progress Notes (Signed)
Daily Progress Note   Patient Name: Courtney Davies       Date: 01/13/2021 DOB: 1975/08/26  Age: 45 y.o. MRN#: [id number] Attending Physician: Meredith Staggers, MD Primary Care Physician: [facility]mit Date: 01/02/2021  Reason for Consultation/Follow-up: Establishing goals of care  Subjective: Medical records reviewed. Patient assessed at the bedside - she remains in her enclosure bed - yelling into the hallway. When I enter room she asks for assistance getting ready for physical therapy. I assured her the staff would not let her miss her physical therapy session. She seems agitated but less agitated than when I saw her last week.   Called patient's sister [name] as discussed with her last week. We reviewed situation. Reviewed that per chart patient is progressing with goals and agitation is somewhat improved though does remain a significant issue. [name] has specific questions about what to expect at discharge, if patient will ever be able to live alone, and if she will require medications for her agitation long term. We discussed uncertainty of situation and time needed for outcomes. We discussed careful balance of managing agitation and sedation. We discussed that patient continues to work with OT, PT, and SLP.    [name] requests updates from rehab MD and rehab [facility] team - upon further discussion she tells me she will wait to speak to [facility] team since it is still unclear what type of support Ms. Frances will need at discharge however she would like to speak to MD today to discuss Ms. Gul' medications.   [name] asks about palliative support outpatient - we discussed type of support available. Discussed this is typically provided by an NP who sees patient.  Length of Stay:  11  Current Medications: Scheduled Meds:  . acetaminophen  650 mg Oral Q6H  . amiodarone  200 mg Oral BID  . carvedilol  6.25 mg Per Tube BID WC  . cephALEXin  500 mg Oral Q8H  . divalproex  500 mg Oral Q8H  . docusate sodium  200 mg Oral Daily  . enoxaparin (LOVENOX) injection  40 mg Subcutaneous Q24H  . insulin aspart  0-5 Units Subcutaneous QHS  . insulin aspart  0-9 Units Subcutaneous TID WC  . insulin glargine-yfgn  10 Units Subcutaneous Daily  . insulin glargine-yfgn  14 Units Subcutaneous Daily  .  losartan  25 mg Per Tube Daily  . multivitamin with minerals  1 tablet Oral Daily  . nicotine  21 mg Transdermal Daily  . pantoprazole  40 mg Oral BID  . Ensure Max Protein  11 oz Oral BID WC  . QUEtiapine  100 mg Oral QHS  . QUEtiapine  50 mg Oral Daily    Continuous Infusions:    PRN Meds: acetaminophen, albuterol, alum & mag hydroxide-simeth, bisacodyl, diphenhydrAMINE, guaiFENesin-dextromethorphan, LORazepam **OR** LORazepam, polyethylene glycol, prochlorperazine **OR** prochlorperazine **OR** prochlorperazine, traZODone  Physical Exam Vitals and nursing note reviewed.  Constitutional:      General: She is not in acute distress. Pulmonary:     Effort: Pulmonary effort is normal.  Skin:    General: Skin is warm and dry.  Neurological:     Mental Status: She is alert. She is disoriented.  Psychiatric:        Cognition and Memory: Cognition is impaired.     Comments: Mild agitation            Vital Signs: BP (!) 116/99 (BP Location: Left Arm)   Pulse 83   Temp 97.6 F (36.4 C) (Oral)   Resp 20   Ht 5' 6"  (1.676 m)   Wt 96.9 kg   SpO2 100%   BMI 34.48 kg/m  SpO2: SpO2: 100 % O2 Device: O2 Device: Room Air O2 Flow Rate:    Intake/output summary: No intake or output data in the 24 hours ending 01/13/21 1014  LBM: Last BM Date: 01/08/21 Baseline Weight: Weight: 97.1 kg Most recent weight: Weight:  (Refused weight)       Palliative Assessment/Data:  50%    Flowsheet Rows    Flowsheet Row Most Recent Value  Intake Tab   Referral Department --  [CIR]  Unit at Time of Referral Other (Comment)  [CIR]  Palliative Care Primary Diagnosis Neurology  Date Notified 01/05/21  Palliative Care Type New Palliative care  Reason for referral Clarify Goals of Care  Date of Admission 01/02/21  Date first seen by Palliative Care 01/06/21  # of days Palliative referral response time 1 Day(s)  # of days IP prior to Palliative referral 3  Clinical Assessment   Psychosocial & Spiritual Assessment   Palliative Care Outcomes        Patient Active Problem List   Diagnosis Date Noted  . Anoxic brain injury ([place]) 01/02/2021  . Fever 12/20/2020  . Abdominal wall cellulitis 12/20/2020  . Dysphagia   . Goals of care, counseling/discussion   . Acute systolic heart failure ([place])   . Encephalopathy acute   . Type 1 diabetes mellitus without complication ([place]) 85/05/7739  . Hypertension 12/02/2020  . GERD (gastroesophageal reflux disease) 12/02/2020  . Anemia 12/02/2020  . Cardiac arrest ([place]) 12/01/2020  . Unilateral primary osteoarthritis, left knee 12/01/2018  . Lateral meniscus, posterior horn derangement, left 05/14/2018  . Status post arthroscopy of left knee 05/14/2018  . 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/19/2017  . Anemia 04/23/2017  . Acute bronchitis 04/23/2017  . Chronic back pain 10/03/2015  . Symptomatic anemia 04/10/2014  . Bilateral edema of lower extremity   . Low back pain with left-sided sciatica     Palliative Care Assessment & Plan   Patient Profile: 45 y.o. female  with past medical history of HTN, DM type 2. B12 def GERD OA admitted on 12/01/2020 with post arrest downtime ~10 minutes, + COVID, NSTEMI, CTA negative PE. Previously had COVID infection ~4 weeks  prior to arrest and reportedly recovered and returned to work. Beginning to be more alert and extubated 12/06/20. MRI does show anoxic brain injury.    Assessment: Hypoxic encephalopathy s/p acute respiratory failure Acute systolic heart failure, compensated Deconditioning Goals of care conversation  Recommendations/Plan: Continue plan of care per CIR team Full code/full scope Psychosocial and emotional support provided PMT remains available to family for support as they are faced with complex decision-making Will refer to outpatient palliative program per family request  Goals of Care and Additional Recommendations: Limitations on Scope of Treatment: Full Scope Treatment  Care plan was discussed with sister [name] and CIR team  Total time: 30 minutes Greater than 50%  of this time was spent counseling and coordinating care related to the above assessment and plan.  Juel Burrow, DNP, AGNP-C Palliative Medicine Team Team Phone # [phone]  Pager # [phone]  Thank you for allowing the Palliative Medicine Team to assist in the care of this patient. Please utilize secure chat with additional questions, if there is no response within 30 minutes please call the above phone number.  Palliative Medicine Team providers are available by phone from 7am to 7pm daily and can be reached through the team cell phone.  Should this patient require assistance outside of these hours, please call the patient's attending physician.

## 2021-01-13 NOTE — Progress Notes (Signed)
Occupational Therapy Session Note  Patient Details  Name: Courtney Davies MRN: [id number] Date of Birth: [birth year]  Today's Date: 01/13/2021 OT Individual Time: 1100-1200 OT Individual Time Calculation (min): 60 min   Short Term Goals: Week 2:  OT Short Term Goal 3 (Week 2): Pt will be A&Ox3 with external cues PRN OT Short Term Goal 4 (Week 2): Patient will demonstrate increaesed safety awareness during BADLs with moderate instrutcional cues OT Short Term Goal 5 (Week 2): Patient will recall 2 tasks she completed during BADL session with min questioning cues  Skilled Therapeutic Interventions/Progress Updates:    Pt greeted semi-reclined in enclosure bed and agreeable to shower today. Pt ambulated in room to collect clothing needed cues for safety awareness and location of items. Pt perseverating on toilet being the shower, even though she was seated in the shower with water running. Eventually pt able to grasp that she was in the shower. Pt more difficult to redirect with shower safety today attempting to place leg on tub bench to wash feet. OT eventually able to redirect pt to safer option of sitting down and washing feet in figure 4 position. Pt still disoriented to time, place, and situation.  Pt continues to be impulsive and needs CGA for all ambulation due to safety concerns. Dressing completed from straight back chair with supervision and max cues for safety. Grooming tasks in standing at the sink with focus on visual scanning to locate grooming items. Discussed with SLP taking pictures of therapists for recall. OT had pt take picture of OT for memory. Pt ambulated in hallway with CGA while OT addressed working memory and recall of events from our session. Had pt try to recall 2 word sequence within 2 minutes, but she was unable to do this even with mutiple cues and hints. Pt returned to room and left semi-reclined in enclosure bed with nursing present to get blood glucose.   Therapy  Documentation Precautions:  Precautions Precautions: Fall Precaution Comments: incontinence, abdominal wound (from PEG tube) Restrictions Weight Bearing Restrictions: No Other Position/Activity Restrictions: posey waist restraint, all bed rails Pain: Pain Assessment Pain Scale: 0-10 Pain Score: 0-No pain   Therapy/Group: Individual Therapy  Valma Cava 01/13/2021, 12:29 PM

## 2021-01-13 NOTE — Progress Notes (Signed)
Increased agitation and aggressive statements overnight. PRN ativan given with positive effect. Slept 6 consecutive hours. Refused daily weight.

## 2021-01-13 NOTE — Progress Notes (Signed)
Physical Therapy Session Note  Patient Details  Name: Courtney Davies MRN: [id number] Date of Birth: 1975-06-17  Today's Date: 01/13/2021 PT Individual Time: 1335-1445 PT Individual Time Calculation (min): 70 min   Short Term Goals: Week 1:  PT Short Term Goal 1 (Week 1): Pt will perform bed mobility with minA. PT Short Term Goal 1 - Progress (Week 1): Met PT Short Term Goal 2 (Week 1): Pt will perform bed to chair transfer with minA. PT Short Term Goal 2 - Progress (Week 1): Met PT Short Term Goal 3 (Week 1): Pt will ambulate x100' with minA. PT Short Term Goal 3 - Progress (Week 1): Met PT Short Term Goal 4 (Week 1): Pt will complete balance assessment. PT Short Term Goal 4 - Progress (Week 1): Progressing toward goal Week 2:  PT Short Term Goal 1 (Week 2): Pt will ambulate 300' with CGA. PT Short Term Goal 2 (Week 2): Pt will consistently perform bed mobility with supervision. PT Short Term Goal 3 (Week 2): Pt will complete balance assessment. Week 3:     Skilled Therapeutic Interventions/Progress Updates:    Pt initially sleeping in posey bed but easily awakened and agreeable to session.  Supine to sit w/supervision.  Gait >282f w/supervision to cga.  Functional gait: Pt able to pick up mult objects off of floor and engage in beanbag toss to target 8-143fw/cga only over 10057fath including pivot turns.  Moves impulsively.    Gait >600f34fsupervision while carrying weighted basket and engaging in dual cognitive task but w/difficulty due to memory deficits.  Pt becomes easily frustrated w/cognitive challenges including recalling biographical info.  Eggcrate puzzles performed in standing, simple level w/solid paired colored eggs, repeated while standing on foam, pt moves quickly and impulsively w/task, completes w/minimal cueing.  Gait >650ft66fga while engaging in safety scenario quizzing, practical problem solving (ie where do you go for gasoline?).  Pt w/vaiable performance  w/cognitive tasks w/some confounding issues due to expressive aphasia occurring w/naming. P returned to room.  Son at bedside, provided w/clean clothes.  Pt encouraged to fold and place in dresser in room.  Pt proceeded to ball clothes up and place each item in dresser.   At end of session, pt returned to and secured within [facility] Documentation Precautions:  Precautions Precautions: Fall Precaution Comments: incontinence, abdominal wound (from PEG tube) Restrictions Weight Bearing Restrictions: No Other Position/Activity Restrictions: posey waist restraint, all bed rails General:      Therapy/Group: Individual Therapy  BarbaJerrilyn Cairo/2022, 3:03 PM

## 2021-01-13 NOTE — Progress Notes (Signed)
Noted son in room with patient. Noted patient eating a chicken sandwich and fries. Explained to patient and son that patient is on a modified diet and aspiration precautions. Patient finished swallowing the food in her mouth and gave her food to the son.

## 2021-01-13 NOTE — Progress Notes (Signed)
Speech Language Pathology Daily Session Note  Patient Details  Name: Courtney Davies MRN: [id number] Date of Birth: [birth year]  Today's Date: 01/13/2021 SLP Individual Time: 5859-2924 SLP Individual Time Calculation (min): 45 min  Short Term Goals: Week 2: SLP Short Term Goal 1 (Week 2): Patient will demonstrate efficient mastication with complete oral clearance with trials of regular textures over 2 sessions with Min verbal cues prior to upgrade. SLP Short Term Goal 2 (Week 2): Patient will utilize external memory aids for recall to place, date and situation with Mod verbal and visual cues. SLP Short Term Goal 3 (Week 2): Patient will demonstrate functional problem solving for basic and familiar tasks with Mod verbal cues. SLP Short Term Goal 4 (Week 2): Patient will demonstrate sustained attention to functional tasks for 15 minutes with Mod verbal cues for redirection. SLP Short Term Goal 5 (Week 2): Patient will utilize word-finding strategies at the word and phrase level with Mod multimodal cues.  Skilled Therapeutic Interventions: Skilled treatment session focused on dysphagia and cognitive goals. Upon arrival, patient was awake and yelling out into the hallway from the enclosure bed. Patient was mildly verbally agitated requesting her mom and feeling disoriented. However, patient easily redirected.  Patient requested to use the bathroom and was continent of urine with Min verbal cues needed for problem solving with task. Patient consumed her breakfast tray of Dys. 3 textures with thin liquids without overt s/s of aspiration but continues to require Mod verbal cues for safety regarding small bites/sips and a slow rate of self-feeding. Recommend patient continue current diet with full supervision to maximize safety. Patient with increased awareness regarding poor functional recall and overall confusion. Patient unable to independently verbalize orientation, suspect function impacted by aphasia but  able to choose the appropriate choices from a field of 3.  SLP discussed initial more external aids to maximize recall, ones in which she can carry with her. Patient verbalized agreement. With Mod-Max verbal cues patient able to identify at least 2 goals that are being addressed within each therapy discipline and recalled orientation information. The external memory aid was not finished this session due to time constraints but will completed within the next therapy session. Patient left sitting EOB with RN present administering medications. Continue with current plan of care.      Pain No/Denies Pain   Therapy/Group: Individual Therapy  Jammie Troup 01/13/2021, 8:50 AM

## 2021-01-13 NOTE — Progress Notes (Signed)
PROGRESS NOTE   Subjective/Complaints: Patient seen sitting up at the edge of her bed and understanding this morning.  She states she slept fairly overnight, no particular reason why.  Sleep chart not updated.  She denies complaints.  She was seen by palliative care this a.m., notes reviewed-appreciate support.  ROS: Limited due to cognitive/behavioral, but appears to deny CP, shortness of breath, nausea, vomiting or diarrhea.  Objective:   No results found. No results for input(s): WBC, HGB, HCT, PLT in the last 72 hours.   No results for input(s): NA, K, CL, CO2, GLUCOSE, BUN, CREATININE, CALCIUM in the last 72 hours.   No intake or output data in the 24 hours ending 01/13/21 1119       Physical Exam: Vital Signs Blood pressure (!) 116/99, pulse 83, temperature 97.6 F (36.4 C), temperature source Oral, resp. rate 20, height 5' 6"  (1.676 m), weight 96.9 kg, SpO2 100 %. Constitutional: No distress . Vital signs reviewed. HENT: Normocephalic.  Atraumatic. Eyes: EOMI. No discharge. Cardiovascular: No JVD.  RRR. Respiratory: Normal effort.  No stridor.  Bilateral clear to auscultation. GI: Non-distended.  BS +. Skin: Warm and dry.  Intact. Psych: Normal mood.  Impulsive. Musc: No edema in extremities.  No tenderness in extremities. Neuro: Alert Motor: Grossly 4/5 throughout  Assessment/Plan: 1. Functional deficits which require 3+ hours per day of interdisciplinary therapy in a comprehensive inpatient rehab setting. Physiatrist is providing close team supervision and 24 hour management of active medical problems listed below. Physiatrist and rehab team continue to assess barriers to discharge/monitor patient progress toward functional and medical goals  Care Tool:  Bathing    Body parts bathed by patient: Right arm   Body parts bathed by helper: Buttocks     Bathing assist Assist Level: Moderate Assistance -  Patient 50 - 74%     Upper Body Dressing/Undressing Upper body dressing   What is the patient wearing?: Pull over shirt    Upper body assist Assist Level: Moderate Assistance - Patient 50 - 74%    Lower Body Dressing/Undressing Lower body dressing      What is the patient wearing?: Incontinence brief     Lower body assist Assist for lower body dressing: Maximal Assistance - Patient 25 - 49%     Toileting Toileting    Toileting assist Assist for toileting: 2 Helpers     Transfers Chair/bed transfer  Transfers assist     Chair/bed transfer assist level: Minimal Assistance - Patient > 75%     Locomotion Ambulation   Ambulation assist      Assist level: Moderate Assistance - Patient 50 - 74% Assistive device: Hand held assist Max distance: 300'   Walk 10 feet activity   Assist     Assist level: Moderate Assistance - Patient - 50 - 74% Assistive device: Hand held assist   Walk 50 feet activity   Assist    Assist level: Moderate Assistance - Patient - 50 - 74% Assistive device: Hand held assist    Walk 150 feet activity   Assist    Assist level: Moderate Assistance - Patient - 50 - 74% Assistive device: Hand held assist  Walk 10 feet on uneven surface  activity   Assist Walk 10 feet on uneven surfaces activity did not occur: Safety/medical concerns         Wheelchair     Assist Is the patient using a wheelchair?: No             Wheelchair 50 feet with 2 turns activity    Assist            Wheelchair 150 feet activity     Assist          Blood pressure (!) 116/99, pulse 83, temperature 97.6 F (36.4 C), temperature source Oral, resp. rate 20, height 5' 6"  (1.676 m), weight 96.9 kg, SpO2 100 %.   Medical Problem List and Plan: 1.  Decline in ADL, mobility , and cognition secondary to anoxic brain injury Contniue CIR  2.  Antithrombotics: -DVT/anticoagulation:  Pharmaceutical: Lovenox              -antiplatelet therapy: N/A 3. Pain Management: Tylenol prn.  4. Mood: LCSW to follow for evaluation and support.              -antipsychotic agents:  - seroquel   -9/29   restlessness and agitation   -off risperdal   -continue trial of seroquel, 14m qam and 1052mqpm   -continue vpa  -Canopy bed for safety  -family education  Appears relatively controlled on 9/30 5. Neuropsych: This patient is not capable of making decisions on her own behalf. 6. Skin/Wound Care:  Monitor abdominal wound for healing.             --routine pressure relief measures.   7. Fluids/Electrolytes/Nutrition: encourage PO  -protein supp for low albumin  8. Cellulitis abdomen: Completed course of Keflex --through 9/27. Continue to monitor for healing.  9. T2DM with hyperglycemia: Hgb A1c-6.9. Transition to trulicity at discharge?             --CBG (last 3)  Recent Labs    01/12/21 1950 01/12/21 2128 01/13/21 0544  GLUCAP 106* 133* 147*      10u semglee in pm and 14u in am  Mildly elevated on 9/30, will consider further adjustments as necessary 10. HTN: Monitor BP tid--continue Cozaar  Controlled on 9/30 11. SVT/NSVT: On coreg and amiodarone bid for rate control.   12. Dysphagia:   -continue D3, thins for now 13.  Acute blood loss anemia  Hemoglobin 9.8 on 9/26, labs ordered for Monday   LOS: 11 days A FACE TO FACE EVALUATION WAS PERFORMED  Enedina Pair AnLorie Phenix/30/2022, 11:19 AM

## 2021-01-14 LAB — GLUCOSE, CAPILLARY
Glucose-Capillary: 66 mg/dL — ABNORMAL LOW (ref 70–99)
Glucose-Capillary: 82 mg/dL (ref 70–99)
Glucose-Capillary: 126 mg/dL — ABNORMAL HIGH (ref 70–99)
Glucose-Capillary: 175 mg/dL — ABNORMAL HIGH (ref 70–99)
Glucose-Capillary: 105 mg/dL — ABNORMAL HIGH (ref 70–99)

## 2021-01-14 NOTE — Progress Notes (Signed)
Nurse called in to room due to mother unzipping enclosure bed. Mother was educated that she need to check with staff before unzipping enclosure bed. Patient placed in chair In room and alarm belt placed on her and activated. Sanda Linger, LPN

## 2021-01-14 NOTE — Progress Notes (Signed)
Hypoglycemic Event  CBG: 66  Treatment: 4 oz juice/soda  Symptoms: Hungry  Follow-up CBG: KGSU:[id number] CBG Result:82  Possible Reasons for Event: Unknown  Comments/MD notified; To notify MD    Gwendolyn Grant

## 2021-01-15 LAB — GLUCOSE, CAPILLARY
Glucose-Capillary: 116 mg/dL — ABNORMAL HIGH (ref 70–99)
Glucose-Capillary: 131 mg/dL — ABNORMAL HIGH (ref 70–99)
Glucose-Capillary: 116 mg/dL — ABNORMAL HIGH (ref 70–99)
Glucose-Capillary: 175 mg/dL — ABNORMAL HIGH (ref 70–99)

## 2021-01-15 NOTE — Progress Notes (Signed)
Occupational Therapy TBI Note  Patient Details  Name: Courtney Davies MRN: [id number] Date of Birth: July 21, 1975  Today's Date: 01/15/2021 OT Individual Time: 9528-4132 OT Individual Time Calculation (min): 55 min    Short Term Goals: Week 2:  OT Short Term Goal 3 (Week 2): Pt will be A&Ox3 with external cues PRN OT Short Term Goal 4 (Week 2): Patient will demonstrate increaesed safety awareness during BADLs with moderate instrutcional cues OT Short Term Goal 5 (Week 2): Patient will recall 2 tasks she completed during BADL session with min questioning cues  Skilled Therapeutic Interventions/Progress Updates:    Treatment session with focus on safety awareness and recall during self-care tasks.  Pt received in handoff from SLP. Pt expressing desire to shower this session.  Pt required max cues to locate clothing in room even after clothing had been selected by therapist.  Pt completed bathing in room shower at sit > stand level, standing for majority of shower except sitting to wash lower legs and feet with cues to utilize figure 4 position.  Pt impulsive throughout shower requiring mod cues for safety and recall during bathing.  Pt attempted to apply Ensure drink to legs as lotion and don shirt as pants due to impulsivity, requiring cues to slow down and look prior to moving/acting.  Pt completed oral care in standing as sink with assistance to open toothpaste as pt with decreased problem solving and attention to task to figure it out. Therapist educating on use of phone and call bell/remote with pt demonstrating decreased recall and decreased attention to task to fully listen to therapist explanation.  Pt returned to semi-reclined in enclosure bed for pt safety.  Therapy Documentation Precautions:  Precautions Precautions: Fall Precaution Comments: incontinence, abdominal wound (from PEG tube) Restrictions Weight Bearing Restrictions: No Other Position/Activity Restrictions: posey waist  restraint, all bed rails  Pain: Pain Assessment Pain Scale: 0-10 Pain Score: 0-No pain Agitated Behavior Scale: TBI Observation Details Observation Environment: pt's room Start of observation period - Date: 01/15/21 Start of observation period - Time: 0930 End of observation period - Date: 01/15/21 End of observation period - Time: 1025 Agitated Behavior Scale (DO NOT LEAVE BLANKS) Short attention span, easy distractibility, inability to concentrate: Present to a moderate degree Impulsive, impatient, low tolerance for pain or frustration: Present to a moderate degree Uncooperative, resistant to care, demanding: Absent Violent and/or threatening violence toward people or property: Absent Explosive and/or unpredictable anger: Absent Rocking, rubbing, moaning, or other self-stimulating behavior: Absent Pulling at tubes, restraints, etc.: Absent Wandering from treatment areas: Present to a slight degree Restlessness, pacing, excessive movement: Present to a slight degree Repetitive behaviors, motor, and/or verbal: Present to a slight degree Rapid, loud, or excessive talking: Present to a slight degree Sudden changes of mood: Present to a slight degree Easily initiated or excessive crying and/or laughter: Absent Self-abusiveness, physical and/or verbal: Absent Agitated behavior scale total score: 23   Therapy/Group: Individual Therapy  Simonne Come 01/15/2021, 11:17 AM

## 2021-01-15 NOTE — Progress Notes (Signed)
PROGRESS NOTE   Subjective/Complaints:  Pt reports she wants to call her mother- and asking to use clock (couldn't come up with the word) to call her mother. Went over names of things and that nursing can help her call.    ROS: limited due to cognition/word finding/aphasia issues  Objective:   No results found. No results for input(s): WBC, HGB, HCT, PLT in the last 72 hours.   No results for input(s): NA, K, CL, CO2, GLUCOSE, BUN, CREATININE, CALCIUM in the last 72 hours.    Intake/Output Summary (Last 24 hours) at 01/15/2021 1015 Last data filed at 01/15/2021 0902 Gross per 24 hour  Intake 480 ml  Output --  Net 480 ml        Physical Exam: Vital Signs Blood pressure 130/83, pulse 68, temperature 98.1 F (36.7 C), temperature source Oral, resp. rate 19, height 5' 6"  (1.676 m), weight 96.7 kg, SpO2 100 %.    General: awake, alert, appropriate, sitting up in bed posey type bed; has phone in 1 hand and remote in other; confused as to naming; NAD HENT: conjugate gaze; oropharynx moist CV: regular rate; no JVD Pulmonary: CTA B/L; no W/R/R- good air movement GI: soft, NT, ND, (+)BS Psychiatric: appropriate; impulsive;  Neurological: alert, but Ox1-2; aphasic Musc: No edema in extremities.  No tenderness in extremities. Neuro: Alert Motor: Grossly 4/5 throughout  Assessment/Plan: 1. Functional deficits which require 3+ hours per day of interdisciplinary therapy in a comprehensive inpatient rehab setting. Physiatrist is providing close team supervision and 24 hour management of active medical problems listed below. Physiatrist and rehab team continue to assess barriers to discharge/monitor patient progress toward functional and medical goals  Care Tool:  Bathing    Body parts bathed by patient: Right arm   Body parts bathed by helper: Buttocks     Bathing assist Assist Level: Moderate Assistance - Patient  50 - 74%     Upper Body Dressing/Undressing Upper body dressing   What is the patient wearing?: Pull over shirt    Upper body assist Assist Level: Moderate Assistance - Patient 50 - 74%    Lower Body Dressing/Undressing Lower body dressing      What is the patient wearing?: Incontinence brief     Lower body assist Assist for lower body dressing: Maximal Assistance - Patient 25 - 49%     Toileting Toileting    Toileting assist Assist for toileting: 2 Helpers     Transfers Chair/bed transfer  Transfers assist     Chair/bed transfer assist level: Minimal Assistance - Patient > 75%     Locomotion Ambulation   Ambulation assist      Assist level: Moderate Assistance - Patient 50 - 74% Assistive device: Hand held assist Max distance: 300'   Walk 10 feet activity   Assist     Assist level: Moderate Assistance - Patient - 50 - 74% Assistive device: Hand held assist   Walk 50 feet activity   Assist    Assist level: Moderate Assistance - Patient - 50 - 74% Assistive device: Hand held assist    Walk 150 feet activity   Assist    Assist level:  Moderate Assistance - Patient - 50 - 74% Assistive device: Hand held assist    Walk 10 feet on uneven surface  activity   Assist Walk 10 feet on uneven surfaces activity did not occur: Safety/medical concerns         Wheelchair     Assist Is the patient using a wheelchair?: No             Wheelchair 50 feet with 2 turns activity    Assist            Wheelchair 150 feet activity     Assist          Blood pressure 130/83, pulse 68, temperature 98.1 F (36.7 C), temperature source Oral, resp. rate 19, height 5' 6"  (1.676 m), weight 96.7 kg, SpO2 100 %.   Medical Problem List and Plan: 1.  Decline in ADL, mobility , and cognition secondary to anoxic brain injury Continue CIR- PT, OT and SLP -asked nursing to help pt call mother to calm her down.  2.   Antithrombotics: -DVT/anticoagulation:  Pharmaceutical: Lovenox             -antiplatelet therapy: N/A 3. Pain Management: Tylenol prn.  4. Mood: LCSW to follow for evaluation and support.              -antipsychotic agents:  - seroquel   -9/29   restlessness and agitation   -off risperdal   -continue trial of seroquel, 36m qam and 1063mqpm   -continue vpa  -Canopy bed for safety  -family education  Appears relatively controlled on 9/30  10/2- con't Canopy bed for pt safety- pt trying to get out this AM 5. Neuropsych: This patient is not capable of making decisions on her own behalf. 6. Skin/Wound Care:  Monitor abdominal wound for healing.             --routine pressure relief measures.   7. Fluids/Electrolytes/Nutrition: encourage PO  -protein supp for low albumin  8. Cellulitis abdomen: Completed course of Keflex --through 9/27. Continue to monitor for healing.  9. T2DM with hyperglycemia: Hgb A1c-6.9. Transition to trulicity at discharge?             --CBG (last 3)  Recent Labs    01/14/21 1635 01/14/21 2100 01/15/21 0553  GLUCAP 175* 105* 116*     10u semglee in pm and 14u in am  Mildly elevated on 9/30, will consider further adjustments as necessary  10/2- had episode of BG 66 this AM- per staff, did get snack- con't regimen and titriate as required 10. HTN: Monitor BP tid--continue Cozaar  Controlled on 9/30 11. SVT/NSVT: On coreg and amiodarone bid for rate control.   12. Dysphagia:   -continue D3, thins for now 13.  Acute blood loss anemia  Hemoglobin 9.8 on 9/26, labs ordered for Monday   LOS: 13 days A FACE TO FACE EVALUATION WAS PERFORMED  Courtney Davies 01/15/2021, 10:15 AM

## 2021-01-15 NOTE — Progress Notes (Signed)
Patient ID: Courtney Davies, female   DOB: 19-Apr-1975, 45 y.o.   MRN: [id number]  SW left message for pt sister Courtney Davies to discuss scheduling family education. SW waiting on follow-up.  [name], MSW, [place] Office: [phone] Cell: [phone] Fax: [phone]

## 2021-01-15 NOTE — Plan of Care (Signed)
  Problem: Safety: Goal: Non-violent Restraint(s) Outcome: Progressing   Problem: Consults Goal: RH BRAIN INJURY PATIENT EDUCATION Description: Description: See Patient Education module for eduction specifics Outcome: Progressing Goal: Skin Care Protocol Initiated - if Braden Score 18 or less Description: If consults are not indicated, leave blank or document N/A Outcome: Progressing Goal: Diabetes Guidelines if Diabetic/Glucose > 140 Description: If diabetic or lab glucose is > 140 mg/dl - Initiate Diabetes/Hyperglycemia Guidelines & Document Interventions  Outcome: Progressing   Problem: RH BOWEL ELIMINATION Goal: RH STG MANAGE BOWEL WITH ASSISTANCE Description: STG Manage Bowel with Min Assistance. Outcome: Progressing   Problem: RH BLADDER ELIMINATION Goal: RH STG MANAGE BLADDER WITH ASSISTANCE Description: STG Manage Bladder With Min Assistance Outcome: Progressing   Problem: RH SKIN INTEGRITY Goal: RH STG ABLE TO PERFORM INCISION/WOUND CARE W/ASSISTANCE Description: STG Able To Perform Incision/Wound Care With [employer]. Outcome: Progressing   Problem: RH SAFETY Goal: RH STG ADHERE TO SAFETY PRECAUTIONS W/ASSISTANCE/DEVICE Description: STG Adhere to Safety Precautions With Min Assistance/Device. Outcome: Progressing   Problem: RH COGNITION-NURSING Goal: RH STG USES MEMORY AIDS/STRATEGIES W/ASSIST TO PROBLEM SOLVE Description: STG Uses Memory Aids/Strategies With Min Assistance to Problem Solve. Outcome: Progressing Goal: RH STG ANTICIPATES NEEDS/CALLS FOR ASSIST W/ASSIST/CUES Description: STG Anticipates Needs/Calls for Assist With Min Assistance/Cues. Outcome: Progressing   Problem: RH PAIN MANAGEMENT Goal: RH STG PAIN MANAGED AT OR BELOW PT'S PAIN GOAL Description: < 3 on a 0-10 pain scale. Outcome: Progressing   Problem: RH KNOWLEDGE DEFICIT BRAIN INJURY Goal: RH STG INCREASE KNOWLEDGE OF SELF CARE AFTER BRAIN INJURY Description: Patient will  demonstrate knowledge on medication management, pain management, skin/wound care with educational materials and handouts provided by staff independently at discharge. Outcome: Progressing

## 2021-01-15 NOTE — Progress Notes (Signed)
Speech Language Pathology Daily Session Note  Patient Details  Name: ARMILDA VANDERLINDEN MRN: [id number] Date of Birth: 1976/01/17  Today's Date: 01/15/2021 SLP Individual Time: 0850-0930 SLP Individual Time Calculation (min): 40 min  Short Term Goals: Week 2: SLP Short Term Goal 1 (Week 2): Patient will demonstrate efficient mastication with complete oral clearance with trials of regular textures over 2 sessions with Min verbal cues prior to upgrade. SLP Short Term Goal 2 (Week 2): Patient will utilize external memory aids for recall to place, date and situation with Mod verbal and visual cues. SLP Short Term Goal 3 (Week 2): Patient will demonstrate functional problem solving for basic and familiar tasks with Mod verbal cues. SLP Short Term Goal 4 (Week 2): Patient will demonstrate sustained attention to functional tasks for 15 minutes with Mod verbal cues for redirection. SLP Short Term Goal 5 (Week 2): Patient will utilize word-finding strategies at the word and phrase level with Mod multimodal cues.  Skilled Therapeutic Interventions:Skilled ST services focused on cognitive skills. Pt requested to brush teeth at sink, pt required only supervision A verbal cues for perseveration in task. Pt was orientated to place mod I, but required total A for orientation to time/situation. SLP posted orientation signs in room and initiated a memory notebook begin recording hospital events. SLP will refer to primary SLP if pt is able to use memory notebook to recall daily events. Pt required max A fade to mod A verbal cues to recall/use aids for situation and year/month. Pt was left in room with OT. SLP recommends to continue skilled services.      Pain Pain Assessment Pain Score: 0-No pain  Therapy/Group: Individual Therapy  Keontay Vora  [facility] 01/15/2021, 4:28 PM

## 2021-01-16 LAB — BASIC METABOLIC PANEL
Anion gap: 7 (ref 5–15)
BUN: 15 mg/dL (ref 6–20)
CO2: 25 mmol/L (ref 22–32)
Calcium: 8.7 mg/dL — ABNORMAL LOW (ref 8.9–10.3)
Chloride: 107 mmol/L (ref 98–111)
Creatinine, Ser: 0.83 mg/dL (ref 0.44–1.00)
GFR, Estimated: >60 mL/min (ref >60)
Glucose, Bld: 111 mg/dL — ABNORMAL HIGH (ref 70–99)
Potassium: 4.1 mmol/L (ref 3.5–5.1)
Sodium: 139 mmol/L (ref 135–145)

## 2021-01-16 LAB — CBC WITH DIFFERENTIAL/PLATELET
Abs Immature Granulocytes: 0.02 10*3/uL (ref 0.00–0.07)
Basophils Absolute: 0 10*3/uL (ref 0.0–0.1)
Basophils Relative: 1 %
Eosinophils Absolute: 0.4 10*3/uL (ref 0.0–0.5)
Eosinophils Relative: 7 %
HCT: 36.6 % (ref 36.0–46.0)
Hemoglobin: 11.2 g/dL — ABNORMAL LOW (ref 12.0–15.0)
Immature Granulocytes: 0 %
Lymphocytes Relative: 39 %
Lymphs Abs: 2.3 10*3/uL (ref 0.7–4.0)
MCH: 22.4 pg — ABNORMAL LOW (ref 26.0–34.0)
MCHC: 30.6 g/dL (ref 30.0–36.0)
MCV: 73.1 fL — ABNORMAL LOW (ref 80.0–100.0)
Monocytes Absolute: 0.6 10*3/uL (ref 0.1–1.0)
Monocytes Relative: 10 %
Neutro Abs: 2.5 10*3/uL (ref 1.7–7.7)
Neutrophils Relative %: 43 %
Platelets: 303 10*3/uL (ref 150–400)
RBC: 5.01 MIL/uL (ref 3.87–5.11)
RDW: 17.1 % — ABNORMAL HIGH (ref 11.5–15.5)
WBC: 5.9 10*3/uL (ref 4.0–10.5)
nRBC: 0 % (ref 0.0–0.2)

## 2021-01-16 LAB — GLUCOSE, CAPILLARY
Glucose-Capillary: 131 mg/dL — ABNORMAL HIGH (ref 70–99)
Glucose-Capillary: 121 mg/dL — ABNORMAL HIGH (ref 70–99)
Glucose-Capillary: 166 mg/dL — ABNORMAL HIGH (ref 70–99)
Glucose-Capillary: 200 mg/dL — ABNORMAL HIGH (ref 70–99)
Glucose-Capillary: 186 mg/dL — ABNORMAL HIGH (ref 70–99)

## 2021-01-16 MED ORDER — QUETIAPINE FUMARATE 50 MG PO TABS
50.0000 mg | ORAL_TABLET | Freq: Two times a day (BID) | ORAL | Status: DC
  Administered 2021-01-16 – 2021-01-24 (×16): 50 mg via ORAL
  Filled 2021-01-16 (×16): qty 1

## 2021-01-16 MED ORDER — CITALOPRAM HYDROBROMIDE 10 MG PO TABS
10.0000 mg | ORAL_TABLET | Freq: Every day | ORAL | Status: DC
  Administered 2021-01-16 – 2021-01-19 (×4): 10 mg via ORAL
  Filled 2021-01-16 (×4): qty 1

## 2021-01-16 NOTE — Plan of Care (Signed)
  Problem: Safety: Goal: Non-violent Restraint(s) Outcome: Progressing   Problem: Consults Goal: RH BRAIN INJURY PATIENT EDUCATION Description: Description: See Patient Education module for eduction specifics Outcome: Progressing Goal: Skin Care Protocol Initiated - if Braden Score 18 or less Description: If consults are not indicated, leave blank or document N/A Outcome: Progressing Goal: Diabetes Guidelines if Diabetic/Glucose > 140 Description: If diabetic or lab glucose is > 140 mg/dl - Initiate Diabetes/Hyperglycemia Guidelines & Document Interventions  Outcome: Progressing   Problem: RH BOWEL ELIMINATION Goal: RH STG MANAGE BOWEL WITH ASSISTANCE Description: STG Manage Bowel with Min Assistance. Outcome: Progressing   Problem: RH BLADDER ELIMINATION Goal: RH STG MANAGE BLADDER WITH ASSISTANCE Description: STG Manage Bladder With Min Assistance Outcome: Progressing   Problem: RH SKIN INTEGRITY Goal: RH STG ABLE TO PERFORM INCISION/WOUND CARE W/ASSISTANCE Description: STG Able To Perform Incision/Wound Care With [employer]. Outcome: Progressing   Problem: RH SAFETY Goal: RH STG ADHERE TO SAFETY PRECAUTIONS W/ASSISTANCE/DEVICE Description: STG Adhere to Safety Precautions With Min Assistance/Device. Outcome: Progressing   Problem: RH COGNITION-NURSING Goal: RH STG USES MEMORY AIDS/STRATEGIES W/ASSIST TO PROBLEM SOLVE Description: STG Uses Memory Aids/Strategies With Min Assistance to Problem Solve. Outcome: Progressing Goal: RH STG ANTICIPATES NEEDS/CALLS FOR ASSIST W/ASSIST/CUES Description: STG Anticipates Needs/Calls for Assist With Min Assistance/Cues. Outcome: Progressing   Problem: RH PAIN MANAGEMENT Goal: RH STG PAIN MANAGED AT OR BELOW PT'S PAIN GOAL Description: < 3 on a 0-10 pain scale. Outcome: Progressing   Problem: RH KNOWLEDGE DEFICIT BRAIN INJURY Goal: RH STG INCREASE KNOWLEDGE OF SELF CARE AFTER BRAIN INJURY Description: Patient will  demonstrate knowledge on medication management, pain management, skin/wound care with educational materials and handouts provided by staff independently at discharge. Outcome: Progressing

## 2021-01-16 NOTE — Progress Notes (Signed)
PROGRESS NOTE   Subjective/Complaints:  Up in bed. Says she slept. Hungry for breakfast again! Agitated over weekend  at times  ROS: Limited due to cognitive/behavioral   Objective:   No results found. No results for input(s): WBC, HGB, HCT, PLT in the last 72 hours.   Recent Labs    01/16/21 0525  NA 139  K 4.1  CL 107  CO2 25  GLUCOSE 111*  BUN 15  CREATININE 0.83  CALCIUM 8.7*      Intake/Output Summary (Last 24 hours) at 01/16/2021 1009 Last data filed at 01/16/2021 0927 Gross per 24 hour  Intake 960 ml  Output --  Net 960 ml        Physical Exam: Vital Signs Blood pressure 126/64, pulse 66, temperature 98 F (36.7 C), temperature source Oral, resp. rate 18, height 5' 6"  (1.676 m), weight 96.7 kg, SpO2 100 %.    Constitutional: No distress . Vital signs reviewed. HEENT: NCAT, EOMI, oral membranes moist Neck: supple Cardiovascular: RRR without murmur. No JVD    Respiratory/Chest: CTA Bilaterally without wheezes or rales. Normal effort    GI/Abdomen: BS +, non-tender, non-distended Ext: no clubbing, cyanosis, or edema Psych: pleasant but impulsive Musc: No edema in extremities.  No tenderness in extremities. Neuro: Alert. Remembers me. Still impulsive with decreased attention and awareness. Confused language at times Motor: Grossly 4/5 throughout  Assessment/Plan: 1. Functional deficits which require 3+ hours per day of interdisciplinary therapy in a comprehensive inpatient rehab setting. Physiatrist is providing close team supervision and 24 hour management of active medical problems listed below. Physiatrist and rehab team continue to assess barriers to discharge/monitor patient progress toward functional and medical goals  Care Tool:  Bathing    Body parts bathed by patient: Right arm, Left arm, Chest, Abdomen, Front perineal area, Buttocks, Right upper leg, Left upper leg, Right lower leg,  Left lower leg, Face   Body parts bathed by helper: Buttocks     Bathing assist Assist Level: Supervision/Verbal cueing (mod cues for safety/sequencing/recall)     Upper Body Dressing/Undressing Upper body dressing   What is the patient wearing?: Pull over shirt    Upper body assist Assist Level: Supervision/Verbal cueing    Lower Body Dressing/Undressing Lower body dressing      What is the patient wearing?: Pants     Lower body assist Assist for lower body dressing: Contact Guard/Touching assist     Toileting Toileting    Toileting assist Assist for toileting: 2 Helpers     Transfers Chair/bed transfer  Transfers assist     Chair/bed transfer assist level: Contact Guard/Touching assist     Locomotion Ambulation   Ambulation assist      Assist level: Moderate Assistance - Patient 50 - 74% Assistive device: Hand held assist Max distance: 300'   Walk 10 feet activity   Assist     Assist level: Moderate Assistance - Patient - 50 - 74% Assistive device: Hand held assist   Walk 50 feet activity   Assist    Assist level: Moderate Assistance - Patient - 50 - 74% Assistive device: Hand held assist    Walk 150 feet activity  Assist    Assist level: Moderate Assistance - Patient - 50 - 74% Assistive device: Hand held assist    Walk 10 feet on uneven surface  activity   Assist Walk 10 feet on uneven surfaces activity did not occur: Safety/medical concerns         Wheelchair     Assist Is the patient using a wheelchair?: No             Wheelchair 50 feet with 2 turns activity    Assist            Wheelchair 150 feet activity     Assist          Blood pressure 126/64, pulse 66, temperature 98 F (36.7 C), temperature source Oral, resp. rate 18, height 5' 6"  (1.676 m), weight 96.7 kg, SpO2 100 %.   Medical Problem List and Plan: 1.  Decline in ADL, mobility , and cognition secondary to anoxic brain  injury -Continue CIR therapies including PT, OT, and SLP  2.  Antithrombotics: -DVT/anticoagulation:  Pharmaceutical: Lovenox             -antiplatelet therapy: N/A 3. Pain Management: Tylenol prn.  4. Mood: LCSW to follow for evaluation and support.              -antipsychotic agents:  - seroquel   -10/3    Still restless and agitated at times -continue  seroquel, 65m qam and 101mqpm    -add mid day dose 5076m -continue vpa 500m37md--check level this week   -add pm celexa 10mg9menclosure bed for safety 5. Neuropsych: This patient is not capable of making decisions on her own behalf. 6. Skin/Wound Care:  Monitor abdominal wound for healing.             --routine pressure relief measures.   7. Fluids/Electrolytes/Nutrition: encourage PO  -protein supp for low albumin   -I personally reviewed the patient's labs today.   8. Cellulitis abdomen: Completed course of Keflex --through 9/27. Continue to monitor for healing.  9. T2DM with hyperglycemia: Hgb A1c-6.9. Transition to trulicity at discharge?             --CBG (last 3)  Recent Labs    01/15/21 1538 01/15/21 2025 01/16/21 0558  GLUCAP 116* 175* 131*     10u semglee in pm and 14u in am  Mildly elevated on 9/30, will consider further adjustments as necessary  10/3 improved control 10. HTN: Monitor BP tid--continue Cozaar  Controlled   11. SVT/NSVT: On coreg and amiodarone bid for rate control.   12. Dysphagia:   -continue D3, thins for now 13.  Acute blood loss anemia  Hemoglobin 9.8 on 9/26   LOS: 14 days A FACE TO FACE EVALUATION WAS PERFORMED  ZachaMeredith Staggers/2022, 10:09 AM

## 2021-01-16 NOTE — Progress Notes (Signed)
Occupational Therapy Session Note  Patient Details  Name: Courtney Davies MRN: [id number] Date of Birth: 11-09-1975  Today's Date: 01/16/2021 Session 1 OT Individual Time: 1103-1200 OT Individual Time Calculation (min): 57 min   Session 2 OT Individual Time: 3825-0539 OT Individual Time Calculation (min): 26 min   Short Term Goals: Week 2:  OT Short Term Goal 3 (Week 2): Pt will be A&Ox3 with external cues PRN OT Short Term Goal 4 (Week 2): Patient will demonstrate increaesed safety awareness during BADLs with moderate instrutcional cues OT Short Term Goal 5 (Week 2): Patient will recall 2 tasks she completed during BADL session with min questioning cues  Skilled Therapeutic Interventions/Progress Updates:    Session 1 Pt greeted semi-reclined in bed enclosure bed and agreeable to OT treatment session. Pt requesting to shower. Functional ambulation in room with close supervision and min cues to locate dresser to obtain clothing. Addressed word finding to name clothing items pulled out of drawers. Pt then needed cues to locate the bathroom and shower. Pt frustrated with therapist bc she thought the sink in the room is where she showers. OT provided gentle reminder that we used to sink bathe, but have been using this shower for about a week now. Pt adamant that she had never been in this shower and becoming increasingly agitated. Pt also agitated that tub bench was in her way during shower, so OT removed bench and let her stand for entirety of shower. Verbal cues for safety to hold onto grab bar when washing feet. Dressing from straight back chair with cues to sit down to thread pant legs. Worked on locating items while standing at the sink with min cues to locate toothbrush and toothpaste. Pt then ambulated to therapy apartment with supervision. OT placed 4 fruits in upper cabinets to encourage reach and balance, but also addressed word finding with naming fruits (grapes, apple, banana, watermelon  slice). Pt unable to name the fruits despite cues of sentence completion  or phonemic cues. Pt ambulated back to room and dicussed events of OT session while OT wrote session down in memory notebook   Session 2 Pt greeted in enclosure bed and agreeable to OT treatment session. Pt ambulated to therapy dayroom with supervision and no AD. Worked on problem solving, visuospatial skills, and word finding. Pt with improved problem solving needing only minimal cues to complete horizontal "x' shape today, when last week, the horizontal planes were very confusing to patient. Patient able to name 5 different colors quickly and accurately, only missing the word "orange" one time. Patient returned to room and asked to go to the bathroom once seated at EOB. Supervision to ambulate into bathroom. Pt doffed pants, quickly sat, then stood back up. OT asked pt if she had gone and pt stated " oh, no I did not." Pt proceded to return to the commode and voided small amount of bladder. Hand washing at the sink with supervision. Pt then returned to bed and left semi-reclined in enclosure bed with needs met.     Therapy Documentation Precautions:  Precautions Precautions: Fall Precaution Comments: incontinence, abdominal wound (from PEG tube) Restrictions Weight Bearing Restrictions: No Other Position/Activity Restrictions: posey waist restraint, all bed rails  Pain:    Therapy/Group: Individual Therapy  Valma Cava 01/16/2021, 3:11 PM

## 2021-01-16 NOTE — Progress Notes (Signed)
Speech Language Pathology Daily Session Note  Patient Details  Name: ETHELEAN COLLA MRN: [id number] Date of Birth: 04-09-1976  Today's Date: 01/16/2021 SLP Individual Time: 1345-1430 SLP Individual Time Calculation (min): 45 min  Short Term Goals: Week 2: SLP Short Term Goal 1 (Week 2): Patient will demonstrate efficient mastication with complete oral clearance with trials of regular textures over 2 sessions with Min verbal cues prior to upgrade. SLP Short Term Goal 2 (Week 2): Patient will utilize external memory aids for recall to place, date and situation with Mod verbal and visual cues. SLP Short Term Goal 3 (Week 2): Patient will demonstrate functional problem solving for basic and familiar tasks with Mod verbal cues. SLP Short Term Goal 4 (Week 2): Patient will demonstrate sustained attention to functional tasks for 15 minutes with Mod verbal cues for redirection. SLP Short Term Goal 5 (Week 2): Patient will utilize word-finding strategies at the word and phrase level with Mod multimodal cues.  Skilled Therapeutic Interventions: Skilled treatment session focused on cognitive-linguistic goals. SLP facilitated session by providing total A for orientation to situation. SLP also created a timeline to utilize as an external aid for recall. With Mod verbal and visual cues, patient able to utilize the calendar appropriately and was independently oriented to hospital. Patient participated in a functional language task and named functional items with 100% accuracy and Mod verbal and phonemic cues. Mod sentence completion cues were also needed to verbalize the function of the items. Overall, patient more responsive to cues which increased her overall word-finding and verbal expression. Patient requested to void but was unable to, suspect due to deficits in attention. Patient transferred to enclosure bed at end of session and left with all needs within reach. Continue with current plan of care.       Pain No/Denies Pain   Therapy/Group: Individual Therapy  Weaver Tweed 01/16/2021, 3:16 PM

## 2021-01-16 NOTE — Progress Notes (Signed)
Physical Therapy Session Note  Patient Details  Name: Courtney Davies MRN: [id number] Date of Birth: 06/02/1975  Today's Date: 01/16/2021 PT Individual Time: 0800-0900 PT Individual Time Calculation (min): 60 min   Short Term Goals: Week 2:  PT Short Term Goal 1 (Week 2): Pt will ambulate 300' with CGA. PT Short Term Goal 2 (Week 2): Pt will consistently perform bed mobility with supervision. PT Short Term Goal 3 (Week 2): Pt will complete balance assessment.  Skilled Therapeutic Interventions/Progress Updates:     Pt received supine in bed and agrees to therapy. No complaint of pain. Pt performs bed mobility with verbal cues for positioning at EOB for safety. PT has pt find clothes to dress self to practice sequencing of task as well as dynamic standing balance, with pt requiring occasional minA for stability when attempting to balance on 1 foot. Pt ambulates x300' with close supervision and cues for navigation in crowded environment as well as maintaining upright gaze to improve posture and balance. Pt verbalizes need to urinate so pt ambulates back to room with same assist, and requires min cueing to navigate bathroom and utilize toilet correctly. Pt then ambulates back to gym to perform training on BITS system for activity tolerance and balance with cognitive overlay. Pt tasked with memory retention of objects such as "flower" and "bell". Pt has extreme difficulty identifying object correctly, <25% of time. With number retention however, pt completes correctly >75% of time with much less cueing required. Pt spends remainder of session ambulating around unit and attempting to identify objects, then writing objects down in memory notebook. Pt requires max cueing to correctly identify objects, then mod cueing when remember same object 1-2 minutes later. At end of session, pt left seated in [facility] at RN station.  Therapy Documentation Precautions:  Precautions Precautions: Fall Precaution Comments:  incontinence, abdominal wound (from PEG tube) Restrictions Weight Bearing Restrictions: No Other Position/Activity Restrictions: posey waist restraint, all bed rails   Therapy/Group: Individual Therapy  Breck Coons, PT, DPT 01/16/2021, 1:14 PM

## 2021-01-16 NOTE — Progress Notes (Signed)
Patient's family concerned pertaining to patient being placed on additional anti-psychotic medication and sedation medication. Care manager informed for care conference on Tuesday. Sanda Linger, LPN

## 2021-01-16 NOTE — Progress Notes (Signed)
Zacarias Pontes 0C14 [facility]) Hospital Liaison note:  Notified of request for [facility] [place] services. Will continue to follow for disposition.  Please call with any outpatient palliative questions or concerns.  Thank you for the opportunity to participate in this patient's care.  Thank you, Lorelee Market, LPN [facility] Liaison [phone]

## 2021-01-16 NOTE — Progress Notes (Signed)
Patient continues to open up enclosure bed and let patient out and ambulate patient to the bathroom. Patient family educated to the risk for fall for the patient. Sanda Linger, LPN

## 2021-01-16 NOTE — Progress Notes (Signed)
Physical Therapy Session Note  Patient Details  Name: Courtney Davies MRN: [id number] Date of Birth: 10/18/1975  Today's Date: 01/16/2021 PT Individual Time: 0934-1002 PT Individual Time Calculation (min): 28 min   Short Term Goals: Week 2:  PT Short Term Goal 1 (Week 2): Pt will ambulate 300' with CGA. PT Short Term Goal 2 (Week 2): Pt will consistently perform bed mobility with supervision. PT Short Term Goal 3 (Week 2): Pt will complete balance assessment.  Skilled Therapeutic Interventions/Progress Updates:     Patient in w/c at the nurses station upon PT arrival. Patient alert and agreeable to PT session. Patient denied pain during session.   Focuses session on continuation of previous PT session where patient identified and wrote down various objects in CIR, instructed patient to locate the items on her list throughout the unit. Focused on recall, item identification, attention, during ambulation for dual task challenge. Patient ambulated with supervision throughout the unit >20 minutes during task. Demonstrated decreased spatial awareness, safety awareness, and impulsivity while ambulating with x3 LOB requiring CGA to correct for safety due to delayed balance strategies.  Patient found the following items with the below time and cues: Checker board: 1 min 37 sec in Day room then main therapy gym Clock: 5 min 30 sec in Main Therapy gym with 2 clocks in the room Mirror: 4 min 15 sec in Day room with 1 mirror Progressed from min cues to max cues on each item due to decreased spatial awareness, attention to task, poor identification of items, required cues for problem solving to bring awareness to an incorrect item being identified, utilized cues for function of the item to improve location and identification.  Patient performed an ambulatory transfer to/from the bathroom with supervision. Demonstrated poor frustration tolerance with constipation, however, would not follow cues for  increased time for toileting, standing x3 after <30 sec seated on the toilet. Performed peri-care with supervision for cues for thoroughness. Patient with smear BM during toileting.   Patient in secured enclosure bed at end of session with breaks locked, with Telesitter in place, and all needs within reach.   Therapy Documentation Precautions:  Precautions Precautions: Fall Precaution Comments: incontinence, abdominal wound (from PEG tube) Restrictions Weight Bearing Restrictions: No Other Position/Activity Restrictions: posey waist restraint, all bed rails    Therapy/Group: Individual Therapy  Dajanique Robley L Mckinze Poirier PT, DPT  01/16/2021, 10:08 AM

## 2021-01-17 LAB — GLUCOSE, CAPILLARY
Glucose-Capillary: 95 mg/dL (ref 70–99)
Glucose-Capillary: 105 mg/dL — ABNORMAL HIGH (ref 70–99)
Glucose-Capillary: 90 mg/dL (ref 70–99)
Glucose-Capillary: 155 mg/dL — ABNORMAL HIGH (ref 70–99)

## 2021-01-17 MED ORDER — INSULIN GLARGINE-YFGN 100 UNIT/ML ~~LOC~~ SOLN
16.0000 [IU] | Freq: Every day | SUBCUTANEOUS | Status: DC
Start: 2021-01-18 — End: 2021-01-21
  Administered 2021-01-18 – 2021-01-21 (×4): 16 [IU] via SUBCUTANEOUS
  Filled 2021-01-17 (×4): qty 0.16

## 2021-01-17 NOTE — Progress Notes (Signed)
Physical Therapy Session Note  Patient Details  Name: Courtney Davies MRN: [id number] Date of Birth: 1975/12/16  Today's Date: 01/17/2021 PT Individual Time: 7618-4859 PT Individual Time Calculation (min): 54 min   Short Term Goals: Week 2:  PT Short Term Goal 1 (Week 2): Pt will ambulate 300' with CGA. PT Short Term Goal 2 (Week 2): Pt will consistently perform bed mobility with supervision. PT Short Term Goal 3 (Week 2): Pt will complete balance assessment.  Skilled Therapeutic Interventions/Progress Updates:     Pt received supine asleep in posey bed. Easily roused and agreeable to therapy. Supine to sit with supervision and cues for positioning at EOB. Pt performs sit to stand and ambulation x300' to gym with verbal cues for navigation and maintaining upright gaze to improve posture and balance. PT asks pt to identify various objects during walk, such as a pumpkin and a doorway. Pt quickly responds "TV" with both questions but appears to realize that object is not a TV. Pt does require totalA to identify objects. Pt then performs BITS system with initial emphasis on pathfinding. Pt effectively completes pathfinding activity with numbers and minimal cueing required. Activity adjusted to object consistency and location. Pt unable to accurately identify object but when provided with accurate identification, is able to locate object (bell) among other objects. Pt then performs memory activity, using words and locating words in specific order. Pt requires maxA to complete, but when activity is adjusted to remembering and ordering numbers, pt is able to correctly sequence up to 7 numbers in working memory. Pt standing throughout activity to work on balance and endurance. Pt then ambulates to Nustep, x300'. Pt completes nustep for strength and endurance training, as well as maintaining attention to task. Pt completes x12:00 at workload of 5 with average steps per minute ~45, with min cueing to remain on  task. Pt then cued to find "medium gloves", requiring min cueing to locate, but is able to don gloves without assistance and sanitize equipment. Pt ambulates back to room. Left supine in posey bed with all needs within reach.  Therapy Documentation Precautions:  Precautions Precautions: Fall Precaution Comments: incontinence, abdominal wound (from PEG tube) Restrictions Weight Bearing Restrictions: No Other Position/Activity Restrictions: posey waist restraint, all bed rails    Therapy/Group: Individual Therapy  Breck Coons, PT, DPT 01/17/2021, 3:58 PM

## 2021-01-17 NOTE — Plan of Care (Signed)
  Problem: RH Swallowing Goal: LTG Pt will demonstrate functional change in swallow as evidenced by bedside/clinical objective assessment (SLP) Description: LTG: Patient will demonstrate functional change in swallow as evidenced by bedside/clinical objective assessment (SLP) Outcome: Not Applicable Note: Goal discharged due to slow progress   Problem: RH Swallowing Goal: LTG Patient will consume least restrictive diet using compensatory strategies with assistance (SLP) Description: LTG:  Patient will consume least restrictive diet using compensatory strategies with assistance (SLP) Flowsheets (Taken 01/17/2021 0614) LTG: Pt Patient will consume least restrictive diet using compensatory strategies with assistance of (SLP): Minimal Assistance - Patient > 75% Note: Downgraded due to slow progress Goal: LTG Patient will participate in dysphagia therapy to increase swallow function with assistance (SLP) Description: LTG:  Patient will participate in dysphagia therapy to increase swallow function with assistance (SLP) Flowsheets (Taken 01/17/2021 4132) LTG: Pt will participate in dysphagia therapy to increase swallow function with assistance of (SLP): Minimal Assistance - Patient > 75% Note: Downgraded due to slow progress   Problem: RH Cognition - SLP Goal: RH LTG Patient will demonstrate orientation with cues Description:  LTG:  Patient will demonstrate orientation to person/place/time/situation with cues (SLP)   Flowsheets (Taken 01/17/2021 0614) LTG: Patient will demonstrate orientation using cueing (SLP): Moderate Assistance - Patient 50 - 74% Note: Goal downgraded due to inconsistent progress   Problem: RH Expression Communication Goal: LTG Patient will increase word finding of common (SLP) Description: LTG:  Patient will increase word finding of common objects/daily info/abstract thoughts with cues using compensatory strategies (SLP). Flowsheets (Taken 01/17/2021 0614) LTG: Patient will  increase word finding of common (SLP): Moderate Assistance - Patient 50 - 74% Note: Goal downgraded due to inconsistent progress   Problem: RH Problem Solving Goal: LTG Patient will demonstrate problem solving for (SLP) Description: LTG:  Patient will demonstrate problem solving for basic/complex daily situations with cues  (SLP) Flowsheets (Taken 01/17/2021 0614) LTG Patient will demonstrate problem solving for: Moderate Assistance - Patient 50 - 74% Note: Goal downgraded due to inconsistent progress   Problem: RH Memory Goal: LTG Patient will use memory compensatory aids to (SLP) Description: LTG:  Patient will use memory compensatory aids to recall biographical/new, daily complex information with cues (SLP) Flowsheets (Taken 01/17/2021 0614) LTG: Patient will use memory compensatory aids to (SLP): Moderate Assistance - Patient 50 - 74% Note: Goal downgrade due to inconsistent progress   Problem: RH Attention Goal: LTG Patient will demonstrate this level of attention during functional activites (SLP) Description: LTG:  Patient will will demonstrate this level of attention during functional activites (SLP) Flowsheets (Taken 01/17/2021 0614) LTG: Patient will demonstrate this level of attention during cognitive/linguistic activities with assistance of (SLP): Moderate Assistance - Patient 50 - 74% Note: Goal downgraded due to inconsistent progress

## 2021-01-17 NOTE — Progress Notes (Signed)
Patient ID: Courtney Davies, female   DOB: 1975-10-29, 45 y.o.   MRN: [id number] Team Conference Report to Patient/Family  Team Conference discussion was reviewed with the patient and caregiver, including goals, any changes in plan of care and target discharge date.  Patient and caregiver express understanding and are in agreement.  The patient has a target discharge date of 01/24/21.  SW followed with pt sister ([name]). Scheduled family education on Friday 1-4 PM. Sister requesting another follow up from physician in regards to medications. No additional questions or concerns.  Dyanne Iha 01/17/2021, 10:57 AM

## 2021-01-17 NOTE — Progress Notes (Signed)
Physical Therapy Session Note  Patient Details  Name: Courtney Davies MRN: [id number] Date of Birth: 03-30-1976  Today's Date: 01/17/2021 PT Individual Time: 1035-1050 PT Individual Time Calculation (min): 15 min   Short Term Goals: Week 1:  PT Short Term Goal 1 (Week 1): Pt will perform bed mobility with minA. PT Short Term Goal 1 - Progress (Week 1): Met PT Short Term Goal 2 (Week 1): Pt will perform bed to chair transfer with minA. PT Short Term Goal 2 - Progress (Week 1): Met PT Short Term Goal 3 (Week 1): Pt will ambulate x100' with minA. PT Short Term Goal 3 - Progress (Week 1): Met PT Short Term Goal 4 (Week 1): Pt will complete balance assessment. PT Short Term Goal 4 - Progress (Week 1): Progressing toward goal Week 2:  PT Short Term Goal 1 (Week 2): Pt will ambulate 300' with CGA. PT Short Term Goal 2 (Week 2): Pt will consistently perform bed mobility with supervision. PT Short Term Goal 3 (Week 2): Pt will complete balance assessment. Week 3:     Skilled Therapeutic Interventions/Progress Updates:    Pt received as handofff from NT on commode.  Pt independent w/hygiene following BM, distant supervision for dressing.  Short distance gait to sink and washes hands w/cues to locate soap dispenser on wall.  Gait 264f w/supervision at which time pt c/o urgent need for BM.  Pt rushes back to room/fast cadence gait w/cues to locate room, no balance loss.  Repeats commode transfer, toileting, pt flushing toilet mult times, excessive toilet paper clogging toilet.  Pt instructed to stop flushing as she would cause overflow. Pt pacing around bathroom w/pants around ankles, unable to pass BM in sitting and states she is standing to do so.  Pointed out that pacing w/pants around feet was a safety hazard.  Pt removes is standing then continues to pace in BR and passing small stools/provided w/washcloths.  NT in to receive pt as handoff.  Therapy Documentation Precautions:   Precautions Precautions: Fall Precaution Comments: incontinence, abdominal wound (from PEG tube) Restrictions Weight Bearing Restrictions: No Other Position/Activity Restrictions: posey waist restraint, all bed rails     Therapy/Group: Individual Therapy [place]10/07/2020, 12:25 PM

## 2021-01-17 NOTE — Progress Notes (Signed)
Patient ID: Courtney Davies, female   DOB: [birth year], 45 y.o.   MRN: [id number]  Family education scheduled on Friday, 10/7 [place], [place]

## 2021-01-17 NOTE — Progress Notes (Addendum)
Occupational Therapy Weekly Progress Note  Patient Details  Name: Courtney Davies MRN: [id number] Date of Birth: [birth year]  Beginning of progress report period: January 03, 2021 End of progress report period: January 17, 2021  Today's Date: 01/17/2021 Session 1 OT Individual Time: 9622-2979 OT Individual Time Calculation (min): 53 min   Session 2 OT Individual Time: 8921-1941 OT Individual Time Calculation (min): 45 min    Patient has met 3 of 3 short term goals.  Patient is making steady progress towards OT goals. She is at an overall supervision level for BADL tasks, but occasionally needs CGA for balance. Pt requires Max cues for safety awareness and recall within functional tasks. Continue current POC.   Patient continues to demonstrate the following deficits: muscle weakness, decreased initiation, decreased attention, decreased awareness, decreased problem solving, decreased safety awareness, and decreased memory, and decreased standing balance and decreased balance strategies and therefore will continue to benefit from skilled OT intervention to enhance overall performance with BADL and Reduce care partner burden.  Patient progressing toward long term goals..  Continue plan of care.  OT Short Term Goals Week 2:  OT Short Term Goal 3 (Week 2): Pt will be A&Ox3 with external cues PRN OT Short Term Goal 3 - Progress (Week 2): Progressing toward goal OT Short Term Goal 4 (Week 2): Patient will demonstrate increaesed safety awareness during BADLs with moderate instrutcional cues OT Short Term Goal 4 - Progress (Week 2): Met OT Short Term Goal 5 (Week 2): Patient will recall 2 tasks she completed during BADL session with min questioning cues OT Short Term Goal 5 - Progress (Week 2): Met Week 3:  OT Short Term Goal 3 (Week 3): LTG=STG 2/2 ELOS  Skilled Therapeutic Interventions/Progress Updates:  Session 1   Pt greeted  standing EOB with nurse tech on the phone with her mother,  asking for more phone numbers.  OT reminded pt that SLP made her a laminated book with all of her numbers in it. Pt stated that these were not ALL of her numbers she needed. OT educated that some of her families numbers were long distance and could not be reached from hospital phone, so she did not type those out. OT able to redirect pt to get in the shower. Pt needed moderate cues to recall where to find her clean clothes. Pt collected clothing and ambulated to bathroom with supervision. Bathing completed in standing with use of grab bars when washing feet and lower body, cues for safety and to make sure she did not hit head on grab bars. Dressing complete seated EOB with cues for safety to sit and thread pant legs. Throughout BADL tasks, OT providing questioning cues for sequence and memory of tasks already completed. Pt able to locate toothpaste and toothbrush at sink today with min questioning cues. OT had pt practice using memory book to answer questions regarding time, place, therapy names, and situation. Dual task activity while ambulating in the hallway of tapping head and rubbing stomach while walking. Pt returned to room and left semi-reclined in enclosure bed with needs met.  Denies pain  Session 2 Pt greeted seated EOB with nurse tech present and agreeable to OT treatment session. Pt ambulated to therapy gym with no AD and supervision. Addressed sequencing and recall with number activity. Pt able to recall up to 6 numbers in order today at most. Focus on repetition of number recall activity. After two rounds, OT had patient take a 2 minute break to  see if she could recall how to play the activity after rest break. Pt was able to recall activity and perform accurately without cues. Pt did a good job of grouping numbers in 2,s and 3,s to help her with recall. Dynamic balance and dual task activity with pt holding tray and trying to balance ball on tray while ambulating in the hallway. Pt only dropped  the ball twice. Returned to room and practiced using memory packet as compensatory strategy to answer questions. Pt left semi-reclined in enclosure bed with needs met.    Therapy Documentation Precautions:  Precautions Precautions: Fall Precaution Comments: incontinence, abdominal wound (from PEG tube) Restrictions Weight Bearing Restrictions: No Other Position/Activity Restrictions: posey waist restraint, all bed rails Pain:  Denies pain    Therapy/Group: Individual Therapy  Valma Cava 01/17/2021, 3:24 PM

## 2021-01-17 NOTE — Patient Care Conference (Signed)
Inpatient RehabilitationTeam Conference and Plan of Care Update Date: 01/17/2021   Time: 10:31 AM    Patient Name: Courtney Davies      Medical Record Number: [id number]  Date of Birth: 1975-08-08 Sex: Female         Room/Bed: 4W15C/4W15C-01 Payor Info: Payor: [place] / Plan: [place] MEDICAID [place] / Product Type: *No Product type* /    Admit Date/Time:  01/02/2021  1:44 PM  Primary Diagnosis:  Anoxic brain injury [facility])  Hospital Problems: Principal Problem:   Anoxic brain injury ([place]) Active Problems:   Acute blood loss anemia   Controlled type 2 diabetes mellitus with hyperglycemia [facility])   Essential hypertension    Expected Discharge Date: Expected Discharge Date: 01/24/21  Team Members Present: Physician leading conference: Dr. Alger Simons Social Worker Present: [name], [place] Nurse Present: Dorthula Nettles, RN PT Present: Tereasa Coop, PT OT Present: Cherylynn Ridges, OT SLP Present: Weston Anna, SLP PPS Coordinator present : Gunnar Fusi, SLP     Current Status/Progress Goal Weekly Team Focus  Bowel/Bladder   Continent of bowel and bladder. LBM 10/3  Remain continent of B&B  Assess toileting needs Q 2 hours and PRN   Swallow/Nutrition/ Hydration   Dys. 3 texturs with thin liquids, Mod A  Min A  use of swallow strategies to maximize safety with PO intake   ADL's   Supervision/CGA, Mod/max cues for recall  Supervision  memory, spatial and safety awareness, dc planning, self-care retraining, pt/family education   Mobility   primarily supervision to CGA for mobility, including ambulation without AD >500'  Supervision  multitasking, attention to task, endurance, safety awareness   Communication   Mod-Max A  Mod A  use of strategies to maximize word-finding at phrase and sentence level   Safety/Cognition/ Behavioral Observations  Max A  Mod A  basic problem solving, recall with use of external aids, attention    Pain   Denies pain. Tylenol available  < 3  Assess pain Q shift and PRN   Skin   Old peg site, OTA, no drainage  Skin will remain clean, dry, and intact  Assess skin Q shift and PRN     Discharge Planning:  Pt to d/c to her mother's home with 24/7 care from patient's mother.  Intermittent support from various family members.   Team Discussion: Continues to adjust medications, improving day to day. Continent B/B, LBM 01/16/2021. No reported pain, Trazodone for sleep. Continuous education on safety and medications. Cognition, memory, and impulsivity continues to be barriers. Mostly supervision. Needs max cues for memory. Supervision to contact guard for mobility. Language improving, needs lots of cues. Continues Dys 3, thin with cough. Impulsive with PO intake.  Patient on target to meet rehab goals: yes  *See Care Plan and progress notes for long and short-term goals.   Revisions to Treatment Plan:  MD continues to adjust medication regimen.  Teaching Needs: Family education, medication management, safety awareness, transfer training, gait training, balance training, endurance training, behavior management.  Current Barriers to Discharge: Decreased caregiver support, Medical stability, Home enviroment access/layout, Lack of/limited family support, Medication compliance, and Behavior  Possible Resolutions to Barriers: MD addressing medication regimen daily and educating family on need for certain medications, cognition and memory addressed daily, mobility needs addressed, safety addressed daily.     Medical Summary Current Status: behavior can be agitated and restless at times but improving. still requiring meds for behavior. diabetic control improving, eating well  Barriers to Discharge: Medical stability;Behavior   Possible Resolutions to [employer]: daily assessment and adjustment of meds. education   Continued Need for Acute Rehabilitation Level of Care: The patient  requires daily medical management by a physician with specialized training in physical medicine and rehabilitation for the following reasons: Direction of a multidisciplinary physical rehabilitation program to maximize functional independence : Yes Medical management of patient stability for increased activity during participation in an intensive rehabilitation regime.: Yes Analysis of laboratory values and/or radiology reports with any subsequent need for medication adjustment and/or medical intervention. : Yes   I attest that I was present, lead the team conference, and concur with the assessment and plan of the team.   Cristi Loron 01/17/2021, 4:17 PM

## 2021-01-17 NOTE — Progress Notes (Signed)
Speech Language Pathology Weekly Progress and Session Note  Patient Details  Name: Courtney Davies MRN: [id number] Date of Birth: 03/12/1976  Beginning of progress report period: January 10, 2021 End of progress report period: January 17, 2021  Today's Date: 01/17/2021 SLP Individual Time: 0810-0855 SLP Individual Time Calculation (min): 45 min  Short Term Goals: Week 2: SLP Short Term Goal 1 (Week 2): Patient will demonstrate efficient mastication with complete oral clearance with trials of regular textures over 2 sessions with Min verbal cues prior to upgrade. SLP Short Term Goal 1 - Progress (Week 2): Not met SLP Short Term Goal 2 (Week 2): Patient will utilize external memory aids for recall to place, date and situation with Mod verbal and visual cues. SLP Short Term Goal 2 - Progress (Week 2): Not met SLP Short Term Goal 3 (Week 2): Patient will demonstrate functional problem solving for basic and familiar tasks with Mod verbal cues. SLP Short Term Goal 3 - Progress (Week 2): Met SLP Short Term Goal 4 (Week 2): Patient will demonstrate sustained attention to functional tasks for 15 minutes with Mod verbal cues for redirection. SLP Short Term Goal 4 - Progress (Week 2): Met SLP Short Term Goal 5 (Week 2): Patient will utilize word-finding strategies at the word and phrase level with Mod multimodal cues. SLP Short Term Goal 5 - Progress (Week 2): Not met    New Short Term Goals: Week 3: SLP Short Term Goal 1 (Week 3): STGs=LTGs due to ELOS  Weekly Progress Updates: Patient continues to make slow and inconsistent gains and has met  2 of 5 STGs this reporting period. Currently, patient is consuming Dys.3 textures with thin liquids with intermittent overt s/s of aspiration with Mod A verbal cues needed for use of swallowing compensatory strategies. Patient continues with a moderate expressive aphasia impacting word-finding and overall verbal expression of wants/needs. However, patient  demonstrates improved ability to utilize cues during structured word-finding tasks. Patient also continues to demonstrate severe cognitive impairments impacting sustained attention, functional problem solving, recall with use of strategies, emergent awareness and overall safety. Currently, patient requires overall Mod-Max A multimodal cues to complete functional and familiar tasks safely but demonstrates improved awareness and ability to follow directions/cues for problem solving and safety. Patient and family education ongoing. Patient would benefit from continued skilled SLP intervention to maximize her cognitive-linguistic and swallowing function prior to discharge.      Intensity: Minumum of 1-2 x/day, 30 to 90 minutes Frequency: 3 to 5 out of 7 days Duration/Length of Stay: 01/24/21 Treatment/Interventions: Cognitive remediation/compensation;Dysphagia/aspiration precaution training;Internal/external aids;Cueing hierarchy;Environmental controls;Therapeutic Activities;Functional tasks;Patient/family education;Speech/Language facilitation   Daily Session  Skilled Therapeutic Interventions:   Skilled treatment session focused on cognitive goals. With Max encouragement, patient consumed ~50% of her meal with Mod verbal cues needed for safety. SLP initiated use of a laminated memory aid that focused on orientation information, phone numbers of family members, and information regarding her therapy team and goals. Patient required Max A multimodal cues for use of external aids to answer questions appropriately regarding this information. However, patient demonstrated improved overall recall of the goal of the session and word-finding with verbal expression at the sentence level. Patient returned to enclosure bed at end of session and left with all needs within reach. Continue with current plan of care.    Pain No/Denies Pain   Therapy/Group: Individual Therapy  Bonnie Roig 01/17/2021, 6:19  AM

## 2021-01-17 NOTE — Progress Notes (Signed)
Patient ID: Courtney Davies, female   DOB: 05-11-1975, 45 y.o.   MRN: [id number]  Family education changed to Saturday 8-12

## 2021-01-17 NOTE — Progress Notes (Addendum)
PROGRESS NOTE   Subjective/Complaints:  Up with SLP. Says she's constipated. Anxious to get home  ROS: Patient denies fever, rash, sore throat, blurred vision, nausea, vomiting, diarrhea, cough, shortness of breath or chest pain, joint or back pain, headache, or mood change.   Objective:   No results found. Recent Labs    01/16/21 1043  WBC 5.9  HGB 11.2*  HCT 36.6  PLT 303     Recent Labs    01/16/21 0525  NA 139  K 4.1  CL 107  CO2 25  GLUCOSE 111*  BUN 15  CREATININE 0.83  CALCIUM 8.7*      Intake/Output Summary (Last 24 hours) at 01/17/2021 2025 Last data filed at 01/16/2021 2000 Gross per 24 hour  Intake 680 ml  Output --  Net 680 ml        Physical Exam: Vital Signs Blood pressure 119/72, pulse 68, temperature 97.7 F (36.5 C), temperature source Oral, resp. rate 18, height 5' 6"  (1.676 m), weight 97.2 kg, SpO2 100 %.    Constitutional: No distress . Vital signs reviewed. HEENT: NCAT, EOMI, oral membranes moist Neck: supple Cardiovascular: RRR without murmur. No JVD    Respiratory/Chest: CTA Bilaterally without wheezes or rales. Normal effort    GI/Abdomen: BS +, non-tender, non-distended Ext: no clubbing, cyanosis, or edema Psych: pleasant but still impulsive Musc: No edema in extremities.  No tenderness in extremities. Neuro: Alert. Remembers me. Still impulsive with decreased attention and awareness. Language more appropriate and on point.  Motor: Grossly 5/5. Good standing balance  Assessment/Plan: 1. Functional deficits which require 3+ hours per day of interdisciplinary therapy in a comprehensive inpatient rehab setting. Physiatrist is providing close team supervision and 24 hour management of active medical problems listed below. Physiatrist and rehab team continue to assess barriers to discharge/monitor patient progress toward functional and medical goals  Care  Tool:  Bathing    Body parts bathed by patient: Right arm, Left arm, Chest, Abdomen, Front perineal area, Buttocks, Right upper leg, Left upper leg, Right lower leg, Left lower leg, Face   Body parts bathed by helper: Buttocks     Bathing assist Assist Level: Supervision/Verbal cueing (mod cues for safety/sequencing/recall)     Upper Body Dressing/Undressing Upper body dressing   What is the patient wearing?: Pull over shirt    Upper body assist Assist Level: Supervision/Verbal cueing    Lower Body Dressing/Undressing Lower body dressing      What is the patient wearing?: Pants     Lower body assist Assist for lower body dressing: Contact Guard/Touching assist     Toileting Toileting    Toileting assist Assist for toileting: 2 Helpers     Transfers Chair/bed transfer  Transfers assist     Chair/bed transfer assist level: Contact Guard/Touching assist     Locomotion Ambulation   Ambulation assist      Assist level: Moderate Assistance - Patient 50 - 74% Assistive device: Hand held assist Max distance: 300'   Walk 10 feet activity   Assist     Assist level: Moderate Assistance - Patient - 50 - 74% Assistive device: Hand held assist   Walk 50  feet activity   Assist    Assist level: Moderate Assistance - Patient - 50 - 74% Assistive device: Hand held assist    Walk 150 feet activity   Assist    Assist level: Moderate Assistance - Patient - 50 - 74% Assistive device: Hand held assist    Walk 10 feet on uneven surface  activity   Assist Walk 10 feet on uneven surfaces activity did not occur: Safety/medical concerns         Wheelchair     Assist Is the patient using a wheelchair?: No             Wheelchair 50 feet with 2 turns activity    Assist            Wheelchair 150 feet activity     Assist          Blood pressure 119/72, pulse 68, temperature 97.7 F (36.5 C), temperature source Oral, resp.  rate 18, height 5' 6"  (1.676 m), weight 97.2 kg, SpO2 100 %.   Medical Problem List and Plan: 1.  Decline in ADL, mobility , and cognition secondary to anoxic brain injury -Continue CIR therapies including PT, OT, and SLP. Interdisciplinary team conference today to discuss goals, barriers to discharge, and dc planning.    2.  Antithrombotics: -DVT/anticoagulation:  Pharmaceutical: Lovenox             -antiplatelet therapy: N/A 3. Pain Management: Tylenol prn.  4. Mood: LCSW to follow for evaluation and support.              -antipsychotic agents:  - seroquel   -10/4    Still restless and agitated at times--improving -continue seroquel, 32m qam and 1038mqpm    - mid day dose 5036m -continue vpa 500m38md--check level this week   -add pm celexa 10mg35menclosure bed for safety 5. Neuropsych: This patient is not capable of making decisions on her own behalf. 6. Skin/Wound Care:  Monitor abdominal wound for healing.             --routine pressure relief measures.   7. Fluids/Electrolytes/Nutrition: encourage PO  -protein supp for low albumin    .   8. Cellulitis abdomen: Completed course of Keflex --through 9/27. Continue to monitor for healing.  9. T2DM with hyperglycemia: Hgb A1c-6.9. Transition to trulicity at discharge?             --CBG (last 3)  Recent Labs    01/16/21 1956 01/16/21 2055 01/17/21 0601  GLUCAP 200* 186* 95     10u semglee in pm and 14u in am   10/4 improved control--increase am semglee to 16u 10. HTN: Monitor BP tid--continue Cozaar  Controlled   11. SVT/NSVT: On coreg and amiodarone bid for rate control.   12. Dysphagia:   -continue D3, thins for now 13.  Acute blood loss anemia  Hemoglobin 9.8 on 9/26  14. Constipation:  -moved bowels 4 x since yesterday afternoon  -continue current reg   LOS: 15 days A FACE TO FACE EVALUATION WAS PERFORMED  ZachaMeredith Staggers/2022, 9:21 AM

## 2021-01-18 LAB — GLUCOSE, CAPILLARY
Glucose-Capillary: 91 mg/dL (ref 70–99)
Glucose-Capillary: 129 mg/dL — ABNORMAL HIGH (ref 70–99)
Glucose-Capillary: 123 mg/dL — ABNORMAL HIGH (ref 70–99)
Glucose-Capillary: 168 mg/dL — ABNORMAL HIGH (ref 70–99)

## 2021-01-18 NOTE — Progress Notes (Signed)
Speech Language Pathology Daily Session Note  Patient Details  Name: Courtney Davies MRN: [id number] Date of Birth: 10-05-1975  Today's Date: 01/18/2021 SLP Individual Time: 0725-0820 SLP Individual Time Calculation (min): 55 min  Short Term Goals: Week 3: SLP Short Term Goal 1 (Week 3): STGs=LTGs due to ELOS  Skilled Therapeutic Interventions: Skilled treatment session focused on dysphagia and cognitive goals. SLP facilitated session by providing skilled observation with breakfast meal of Dys. 3 textures with thin liquids. Patient demonstrated reduced impulsivity and utilized smaller bites and a slower rate of self-feeding. No overt s/s of aspiration. Recommend patient continue current diet. SLP also facilitated session by providing overall Min a verbal cues for working memory and functional problem solving during a basic money management task. Patient mildly perseverative on using the bathroom but was easily redirected back to tasks. Patient utilized her external memory aids to answer orientation questions with Min-Mod A verbal cues. Patient left upright in enclosure bed with all needs within reach. Continue with current plan of care.      Pain No/Denies [name]  Therapy/Group: Individual Therapy  Avishai Reihl 01/18/2021, 8:24 AM

## 2021-01-18 NOTE — Progress Notes (Signed)
Occupational Therapy Session Note  Patient Details  Name: LAURETTA SALLAS MRN: [id number] Date of Birth: 02-05-1976  Today's Date: 01/18/2021 OT Individual Time: 0902-1000 OT Individual Time Calculation (min): 58 min    Short Term Goals: Week 1:  OT Short Term Goal 1 (Week 1): Pt will be A&Ox4 with external cues PRN OT Short Term Goal 1 - Progress (Week 1): Progressing toward goal OT Short Term Goal 2 (Week 1): Pt will participate in ADLs 3 days in a row without agitation OT Short Term Goal 2 - Progress (Week 1): Met OT Short Term Goal 3 (Week 1): Pt will complete bed mobility with close (S) OT Short Term Goal 3 - Progress (Week 1): Met OT Short Term Goal 4 (Week 1): Pt will bathe UB with min cuing for attention to task OT Short Term Goal 4 - Progress (Week 1): Met Week 2:  OT Short Term Goal 3 (Week 2): Pt will be A&Ox3 with external cues PRN OT Short Term Goal 3 - Progress (Week 2): Progressing toward goal OT Short Term Goal 4 (Week 2): Patient will demonstrate increaesed safety awareness during BADLs with moderate instrutcional cues OT Short Term Goal 4 - Progress (Week 2): Met OT Short Term Goal 5 (Week 2): Patient will recall 2 tasks she completed during BADL session with min questioning cues OT Short Term Goal 5 - Progress (Week 2): Met Week 3:  OT Short Term Goal 3 (Week 3): LTG=STG 2/2 ELOS  Skilled Therapeutic Interventions/Progress Updates:  Patient met lying in enclosure bed in agreement with OT treatment session. 0/10 pain reported at rest and with activity. Patient pleasant initially however became progressively agitated with orientation questions. Patient easily redirected to shift focus to goals of OT session. Patient with desire to bathe at shower level (does not prefer shower in room). With mod vc's for attention and sequencing, patient able to gather ADL items in prep for shower. At linen cart patient reported need for 2 washcloths in prep for shower. Max cues for  selection of correct amount of washcloths. In ADL apartment, patient able to doff clothing in standing with cues for safety and pacing 2/2 impulsivity. Tub shower transfer, UB/LB bathing/dressing with supervision A overall. Period of Min guard for safety to don LB clothing in standing. Patient reports frustration with decreased recall stating that her goals were to remember things better and to return home with family. Path finding for return to hospital room with Max cues. Oral hygiene and hair combing task completed in standing at sink level with supervision A and Mod cues for sequencing and problem solving when patient realized tube of toothpaste was empty. At conclusion of session, patient able to recall several tasks completed this date with mod cues for recall and question cues. Session concluded with patient in semi reclined position in enclosure bed with call bell within reach and all needs met.  Therapy Documentation Precautions:  Precautions Precautions: Fall Precaution Comments: incontinence, abdominal wound (from PEG tube) Restrictions Weight Bearing Restrictions: No Other Position/Activity Restrictions: posey waist restraint, all bed rails General:    Therapy/Group: Individual Therapy  Josejuan Hoaglin R [place] 01/18/2021, 6:49 AM

## 2021-01-18 NOTE — Progress Notes (Signed)
Physical Therapy Session Note  Patient Details  Name: MATRACA HUNKINS MRN: [id number] Date of Birth: 1976-01-15  Today's Date: 01/18/2021 PT Individual Time: 1530-1600 PT Individual Time Calculation (min): 30 min   Short Term Goals: Week 3:  PT Short Term Goal 1 (Week 3): STGs = LTGs  Skilled Therapeutic Interventions/Progress Updates:   Patient received sitting up in chair with mom at side, agreeable to PT. Patient denies pain. She remains relatively impulsive sit <> stand and transfers. Patient ambulating to dayroom with no AD and CGA. General instability noted with trendelenburg gait. Patient completed x8 mins on NuStep with LE only for improved strength/endurance. Patient then ambulating >300' naming common objects such as clock, mirror, stairs, table, chair. She required Max cuing for accuracy. Upon returning to patients room she became increasingly agitated about her phone stating that she had lost multiple cell phones since her hospital admission. PT able to redirect patient to remain seated with seatbelt alarm on, telesitter in place, call light and room phone within reach- RN aware of patients brief agitation.   Therapy Documentation Precautions:  Precautions Precautions: Fall Precaution Comments: incontinence, abdominal wound (from PEG tube) Restrictions Weight Bearing Restrictions: No Other Position/Activity Restrictions: posey waist restraint, all bed rails  Therapy/Group: Individual Therapy  Karoline Caldwell, PT, DPT, CBIS  01/18/2021, 4:23 PM

## 2021-01-18 NOTE — Progress Notes (Signed)
Physical Therapy Weekly Progress Note  Patient Details  Name: Courtney Davies MRN: [id number] Date of Birth: [birth year]  Beginning of progress report period: January 11, 2021 End of progress report period: January 18, 2021  Today's Date: 01/18/2021 PT Individual Time: 1302-1400 PT Individual Time Calculation (min): 58 min   Patient has met 3 of 3 short term goals.  Pt is progressing well toward mobility goals, improving independence in all areas of mobility.  Patient continues to demonstrate the following deficits muscle weakness, decreased cardiorespiratoy endurance, decreased awareness, decreased problem solving, and decreased safety awareness, and decreased standing balance and decreased balance strategies and therefore will continue to benefit from skilled PT intervention to increase functional independence with mobility.  Patient progressing toward long term goals..  Continue plan of care.  PT Short Term Goals Week 3:  PT Short Term Goal 1 (Week 3): STGs = LTGs  Skilled Therapeutic Interventions/Progress Updates:  Ambulation/gait training;Community reintegration;DME/adaptive equipment instruction;Psychosocial support;Neuromuscular re-education;Stair training;UE/LE Strength taining/ROM;Wheelchair propulsion/positioning;Balance/vestibular training;Functional electrical stimulation;Discharge planning;Pain management;Skin care/wound management;Therapeutic Activities;UE/LE Coordination activities;Cognitive remediation/compensation;Disease management/prevention;Functional mobility training;Patient/family education;Splinting/orthotics;Therapeutic Exercise;Visual/perceptual remediation/compensation   Pt received seated in hard backed chair with mother in room. Pt is agreeable to therapy and without complaints of pain. Sit to stand with supervision with cues for initiation for safety. Pt performs pathfinding tasked, asked to find gift shop using environmental cues. Pt requires max cueing to use  map for assistance. Pt then requires mod cueing to correctly use elevator. After finding gift shop, pt ambulates outside for practices with gait training over unlevel and varying surfaces, as well as incorporating sitting in public spaces for community reintegration. Pt requires min cueing to safely approach benches outside to sit. Pt ambulates >1000' with cues to increase step height to decrease risk for falls. Pt walks down x10 steps with cues to utilize handrail correctly. Pt asked what to do when approaching an intersection and pt responds to "look both ways", with cueing required. Pt even reminds PT to look both ways when crossing additional intersection. Pt navigate back to CIR with mod cueing for use of elevator. Pt takes seated rest prior to additional mobility tasks.  Pt performs BERG balance test, as detailed below. Following, pt ambulates back to room with close supervision and cues for navigation. Left seated with alarm intact and all needs within reach.  Therapy Documentation Precautions:  Precautions Precautions: Fall Precaution Comments: incontinence, abdominal wound (from PEG tube) Restrictions Weight Bearing Restrictions: No Other Position/Activity Restrictions: posey waist restraint, all bed rails Balance: Balance Balance Assessed: Yes Standardized Balance Assessment Standardized Balance Assessment: Berg Balance Test Berg Balance Test Sit to Stand: Able to stand without using hands and stabilize independently Standing Unsupported: Able to stand safely 2 minutes Sitting with Back Unsupported but Feet Supported on Floor or Stool: Able to sit safely and securely 2 minutes Stand to Sit: Sits safely with minimal use of hands Transfers: Able to transfer safely, minor use of hands Standing Unsupported with Eyes Closed: Able to stand 10 seconds safely Standing Ubsupported with Feet Together: Able to place feet together independently and stand for 1 minute with supervision From  Standing, Reach Forward with Outstretched Arm: Reaches forward but needs supervision From Standing Position, Pick up Object from Floor: Able to pick up shoe, needs supervision From Standing Position, Turn to Look Behind Over each Shoulder: Needs supervision when turning Turn 360 Degrees: Needs close supervision or verbal cueing Standing Unsupported, Alternately Place Feet on Step/Stool: Able to stand independently and complete 8 steps >  20 seconds Standing Unsupported, One Foot in Front: Able to plae foot ahead of the other independently and hold 30 seconds Standing on One Leg: Able to lift leg independently and hold equal to or more than 3 seconds Total Score: 41  Therapy/Group: Individual Therapy  Breck Coons, PT, DPT 01/18/2021, 3:58 PM

## 2021-01-19 LAB — HEPATIC FUNCTION PANEL
ALT: 18 U/L (ref 0–44)
AST: 12 U/L — ABNORMAL LOW (ref 15–41)
Albumin: 2.8 g/dL — ABNORMAL LOW (ref 3.5–5.0)
Alkaline Phosphatase: 66 U/L (ref 38–126)
Bilirubin, Direct: <0.1 mg/dL (ref 0.0–0.2)
Total Bilirubin: 0.3 mg/dL (ref 0.3–1.2)
Total Protein: 6.1 g/dL — ABNORMAL LOW (ref 6.5–8.1)

## 2021-01-19 LAB — VALPROIC ACID LEVEL: Valproic Acid Lvl: 51 ug/mL (ref 50.0–100.0)

## 2021-01-19 LAB — GLUCOSE, CAPILLARY
Glucose-Capillary: 112 mg/dL — ABNORMAL HIGH (ref 70–99)
Glucose-Capillary: 107 mg/dL — ABNORMAL HIGH (ref 70–99)
Glucose-Capillary: 98 mg/dL (ref 70–99)
Glucose-Capillary: 180 mg/dL — ABNORMAL HIGH (ref 70–99)

## 2021-01-19 NOTE — Progress Notes (Signed)
Occupational Therapy Session Note  Patient Details  Name: Courtney Davies MRN: [id number] Date of Birth: [birth year]  Today's Date: 01/19/2021 OT Individual Time: 0850-1000 OT Individual Time Calculation (min): 70 min    Short Term Goals: Week 3:  OT Short Term Goal 3 (Week 3): LTG=STG 2/2 ELOS  Skilled Therapeutic Interventions/Progress Updates:    Pt greeted seated EOB with nurse tech present. Pt agreeable to shower this morning. Pt slightly more agitated looking for her phone, but able to redirect her to shower and locate clothing in dresser. Pt stood to shower with supervision and verbal cues for safety awareness. Pt with increased agitation when OT cued pt to sit down to thread pant legs. OT provided education on why she should sit down (safety and decrease risk of falling) and pt was agreeable to stated she understood why. Addressed word finding and visual scanning to locate 3 items at sink with min cues. Pt stood to brush teeth and wash face w/ supervision. Continued working on [employer] recall notebook strategies to answer orientation questions. Pt ambulated to therapy gym with supervision. Dual task activity of 10 mins on NuStep, while singing pt preferred music. Also had pt locate time and steps per minute on screen. Brought pt into quadruped position for core strengthening and incorporated card matching activity requiring visual scanning and object identification as cards had different kinds of fish on them. Pt ambulated back to room and was able to pathfind and locate room with minimal cues. Pt reported need to go to the bathroom and completed toileting tasks after successful void with supervision. Pt stood to wash hands with cues to locate soap dispenser on L side of sink. Pt left semi-reclined in enclosure bed as nursing entered to administer meds.   Therapy Documentation Precautions:  Precautions Precautions: Fall Precaution Comments: incontinence, abdominal wound (from PEG  tube) Restrictions Weight Bearing Restrictions: No Other Position/Activity Restrictions: posey waist restraint, all bed rails Pain: Denies pain   Therapy/Group: Individual Therapy  Valma Cava 01/19/2021, 9:47 AM

## 2021-01-19 NOTE — Progress Notes (Signed)
Patient A&O to self and family members. Remains anxious and confused throughout the shift. Patient is very impulsive and has no safety awareness. Safety evaluation every 2 hours for enclosure bed. Patient denies pain or discomfort. Patient used the bathroom, walked, snacks provided/fluids per protocol. Patient also attend therapy. Call light within reach, personal items within reach. Safety maintained.

## 2021-01-19 NOTE — Progress Notes (Signed)
Speech Language Pathology Daily Session Note  Patient Details  Name: Courtney Davies MRN: [id number] Date of Birth: [birth year]  Today's Date: 01/19/2021 SLP Individual Time: 0700-0745 SLP Individual Time Calculation (min): 45 min  Short Term Goals: Week 3: SLP Short Term Goal 1 (Week 3): STGs=LTGs due to ELOS  Skilled Therapeutic Interventions: Skilled treatment session focused on cognitive goals. Upon arrival, patient was asleep in enclosure bed and required extra time to rouse. SLP facilitated session by providing Min A verbal cues for problem solving during tray set-up with breakfast meal of Dys. 3 textures with thin liquids. Patient initially mildly verbally frustrated but quickly appeared more calm and cooperative as session went on and she appeared more alert. Patient completed basic self-care tasks at the sink with Min verbal cues for problem solving. SLP also required Max A multimodal cues for functional problem solving during a clock drawing task. Patient left upright in bed with alarm on and all needs within reach. Continue with current plan of care.      Pain No/Denies Pain   Therapy/Group: Individual Therapy  Faustina Gebert 01/19/2021, 9:01 AM

## 2021-01-19 NOTE — Progress Notes (Signed)
PROGRESS NOTE   Subjective/Complaints:  In bed, anxious because she can't find her cell phone. Doesn't think hospital phone will work. Said ultimately that she needed to use bathroom  ROS: Patient denies fever, rash, sore throat, blurred vision, nausea, vomiting, diarrhea, cough, shortness of breath or chest pain,   headache, or mood change.   Objective:   No results found. Recent Labs    01/16/21 1043  WBC 5.9  HGB 11.2*  HCT 36.6  PLT 303     No results for input(s): NA, K, CL, CO2, GLUCOSE, BUN, CREATININE, CALCIUM in the last 72 hours.     Intake/Output Summary (Last 24 hours) at 01/19/2021 1010 Last data filed at 01/19/2021 0700 Gross per 24 hour  Intake 220 ml  Output --  Net 220 ml        Physical Exam: Vital Signs Blood pressure 124/77, pulse 76, temperature 98.1 F (36.7 C), temperature source Oral, resp. rate 19, height 5' 6"  (1.676 m), weight 97.6 kg, SpO2 100 %.    Constitutional: No distress . Vital signs reviewed. HEENT: NCAT, EOMI, oral membranes moist Neck: supple Cardiovascular: RRR without murmur. No JVD    Respiratory/Chest: CTA Bilaterally without wheezes or rales. Normal effort    GI/Abdomen: BS +, non-tender, non-distended Ext: no clubbing, cyanosis, or edema Psych: anxious and distracted Musc: No edema in extremities.  No tenderness in extremities. Neuro:alert, distracted and impulsive. . Language more appropriate and on point.  Motor: Grossly 5/5. Good standing balance  Assessment/Plan: 1. Functional deficits which require 3+ hours per day of interdisciplinary therapy in a comprehensive inpatient rehab setting. Physiatrist is providing close team supervision and 24 hour management of active medical problems listed below. Physiatrist and rehab team continue to assess barriers to discharge/monitor patient progress toward functional and medical goals  Care Tool:  Bathing     Body parts bathed by patient: Right arm, Left arm, Chest, Abdomen, Front perineal area, Buttocks, Right upper leg, Left upper leg, Right lower leg, Left lower leg, Face   Body parts bathed by helper: Buttocks     Bathing assist Assist Level: Supervision/Verbal cueing (Cueing for safety/sequencing/recall)     Upper Body Dressing/Undressing Upper body dressing   What is the patient wearing?: Pull over shirt    Upper body assist Assist Level: Supervision/Verbal cueing    Lower Body Dressing/Undressing Lower body dressing      What is the patient wearing?: Pants, Underwear/pull up     Lower body assist Assist for lower body dressing: Contact Guard/Touching assist (Standing with unilateral UE support on wall)     Toileting Toileting    Toileting assist Assist for toileting: 2 Helpers     Transfers Chair/bed transfer  Transfers assist     Chair/bed transfer assist level: Contact Guard/Touching assist     Locomotion Ambulation   Ambulation assist      Assist level: Moderate Assistance - Patient 50 - 74% Assistive device: Hand held assist Max distance: 300'   Walk 10 feet activity   Assist     Assist level: Moderate Assistance - Patient - 50 - 74% Assistive device: Hand held assist   Walk 50 feet activity  Assist    Assist level: Moderate Assistance - Patient - 50 - 74% Assistive device: Hand held assist    Walk 150 feet activity   Assist    Assist level: Moderate Assistance - Patient - 50 - 74% Assistive device: Hand held assist    Walk 10 feet on uneven surface  activity   Assist Walk 10 feet on uneven surfaces activity did not occur: Safety/medical concerns         Wheelchair     Assist Is the patient using a wheelchair?: No             Wheelchair 50 feet with 2 turns activity    Assist            Wheelchair 150 feet activity     Assist          Blood pressure 124/77, pulse 76, temperature 98.1 F  (36.7 C), temperature source Oral, resp. rate 19, height 5' 6"  (1.676 m), weight 97.6 kg, SpO2 100 %.   Medical Problem List and Plan: 1.  Decline in ADL, mobility , and cognition secondary to anoxic brain injury -Continue CIR therapies including PT, OT, and SLP   2.  Antithrombotics: -DVT/anticoagulation:  Pharmaceutical: Lovenox             -antiplatelet therapy: N/A 3. Pain Management: Tylenol prn.  4. Mood: LCSW to follow for evaluation and support.              -antipsychotic agents:  - seroquel   -10/6      agitated at times--improving -continue seroquel, 59m qam and 1061mqpm    - mid day dose 5060m -vpa level 51, continue 500m51mD   -added pm celexa 10mg11mcontinue enclosure bed for safety 5. Neuropsych: This patient is not capable of making decisions on her own behalf. 6. Skin/Wound Care:  Monitor abdominal wound for healing.             --routine pressure relief measures.   7. Fluids/Electrolytes/Nutrition: encourage PO  -protein supp for low albumin    .   8. Cellulitis abdomen: Completed course of Keflex --through 9/27. Continue to monitor for healing.  9. T2DM with hyperglycemia: Hgb A1c-6.9. Transition to trulicity at discharge?             --CBG (last 3)  Recent Labs    01/18/21 1653 01/18/21 2045 01/19/21 0554  GLUCAP 123* 168* 112*     10u semglee in pm and 14u in am   10/6 improved control--increase am semglee to 16u 10. HTN: Monitor BP tid--continue Cozaar  Controlled  10/6 11. SVT/NSVT: On coreg and amiodarone bid for rate control.   12. Dysphagia:   -continue D3, thins for now 13.  Acute blood loss anemia  Hemoglobin 9.8 on 9/26  14. Constipation:  -moving bowels regularly  -continue current reg   LOS: 17 days A FACE TO FACE EVALUATION WAS PERFORMED  ZachaMeredith Staggers/2022, 10:10 AM

## 2021-01-19 NOTE — Progress Notes (Signed)
Physical Therapy Session Note  Patient Details  Name: Courtney Davies MRN: [id number] Date of Birth: 03/11/1976  Today's Date: 01/19/2021 PT Individual Time: 1034-1130 PT Individual Time Calculation (min): 56 min   Short Term Goals: Week 3:  PT Short Term Goal 1 (Week 3): STGs = LTGs  Skilled Therapeutic Interventions/Progress Updates:     Pt received supine in posey bed and agrees to therapy. No complaint of pain. Supine to sit with verbal cues for positioning at EOB for safety. Pt performs all functional mobility with close supervision for safety and to provide cues for navigation and redirection to task, as pt becomes easily externally distracted. Pt ambulates to restroom and performs toileting with cues for positioning. Pt does recall to pull pants up after toileting without cues, which is an improvement from previous sessions. Pt ambulates to dayroom and participates in Wii bowling game, performed for work on dynamic balance as well as ability to follow a sequence of instruction and demonstrate carryover. Pt shows slow progress and comprehension of instruction, but requires max cueing throughout. Pt then ambulates in hallway and tasked with finding pictures of therapy team to work on multitasking and short term memory. Pt able to find picture with min cueing.   Pt performs 3x10 sit to stand transfers with arms folded across chest to increase strengthening and also wo improve body mechanics with transfer. Pt also performs 3x10 high marches to work on balance and strengthening, with cues for upright gaze to improve posture and balance.  Pt left seated in chair with alarm intact and all needs within reach.  Therapy Documentation Precautions:  Precautions Precautions: Fall Precaution Comments: incontinence, abdominal wound (from PEG tube) Restrictions Weight Bearing Restrictions: No Other Position/Activity Restrictions: posey waist restraint, all bed rails    Therapy/Group: Individual  Therapy  Breck Coons, PT, DPT 01/19/2021, 4:18 PM

## 2021-01-20 LAB — GLUCOSE, CAPILLARY
Glucose-Capillary: 83 mg/dL (ref 70–99)
Glucose-Capillary: 196 mg/dL — ABNORMAL HIGH (ref 70–99)
Glucose-Capillary: 116 mg/dL — ABNORMAL HIGH (ref 70–99)

## 2021-01-20 MED ORDER — CITALOPRAM HYDROBROMIDE 20 MG PO TABS
20.0000 mg | ORAL_TABLET | Freq: Every day | ORAL | Status: DC
  Administered 2021-01-20 – 2021-01-23 (×4): 20 mg via ORAL
  Filled 2021-01-20 (×4): qty 1

## 2021-01-20 NOTE — Progress Notes (Addendum)
Patient ID: Courtney Davies, female   DOB: 01/07/1976, 45 y.o.   MRN: [id number]  SW spoke with pt sister [name] to discuss d/c recommendations of outpatient PT/OT/SLP. Preferred location is [facility] (V:[phone]/A:[phone]). SW reviewed d/c process. Reports that she would like to have personal aide. SW informed referral was sent to insurance and waiting on follow-up. SW confirmed family edu and encouraged them to check her schedule this evening to be aware of what time she is scheduled for tomorrow. No additional/questions concerns.   *SW spoke with Laurie/RN CM [phone]) who wanted to discuss pt d/c plan as she was told pt was discharging with LTC. SW informed on d/c plan to home with OPT. SW provided contact information for her sister Legrand Como so she could begin expedited referral for PCS services.   SW faxed outpatient referral to [facility].   [name], MSW, [place] Office: [phone] Cell: [phone] Fax: [phone]

## 2021-01-20 NOTE — Progress Notes (Signed)
PROGRESS NOTE   Subjective/Complaints: Remains anxious and disinhibited at times, fairly calm this morning when I entered  ROS: Limited due to cognitive/behavioral .   Objective:   No results found. No results for input(s): WBC, HGB, HCT, PLT in the last 72 hours.    No results for input(s): NA, K, CL, CO2, GLUCOSE, BUN, CREATININE, CALCIUM in the last 72 hours.     Intake/Output Summary (Last 24 hours) at 01/20/2021 1023 Last data filed at 01/20/2021 0700 Gross per 24 hour  Intake 600 ml  Output --  Net 600 ml        Physical Exam: Vital Signs Blood pressure 132/87, pulse 81, temperature 98 F (36.7 C), temperature source Oral, resp. rate 18, height 5' 6"  (1.676 m), weight 97.6 kg, SpO2 100 %.    Constitutional: No distress . Vital signs reviewed. HEENT: NCAT, EOMI, oral membranes moist Neck: supple Cardiovascular: RRR without murmur. No JVD    Respiratory/Chest: CTA Bilaterally without wheezes or rales. Normal effort    GI/Abdomen: BS +, non-tender, non-distended Ext: no clubbing, cyanosis, or edema Psych: pleasant and cooperative .  No tenderness in extremities. Neuro:alert, distracted and impulsive. . Language more appropriate and on point.  Motor: Grossly 5/5. Good standing balance  Assessment/Plan: 1. Functional deficits which require 3+ hours per day of interdisciplinary therapy in a comprehensive inpatient rehab setting. Physiatrist is providing close team supervision and 24 hour management of active medical problems listed below. Physiatrist and rehab team continue to assess barriers to discharge/monitor patient progress toward functional and medical goals  Care Tool:  Bathing    Body parts bathed by patient: Right arm, Left arm, Chest, Abdomen, Front perineal area, Buttocks, Right upper leg, Left upper leg, Right lower leg, Left lower leg, Face   Body parts bathed by helper: Buttocks      Bathing assist Assist Level: Supervision/Verbal cueing (Cueing for safety/sequencing/recall)     Upper Body Dressing/Undressing Upper body dressing   What is the patient wearing?: Pull over shirt    Upper body assist Assist Level: Supervision/Verbal cueing    Lower Body Dressing/Undressing Lower body dressing      What is the patient wearing?: Pants, Underwear/pull up     Lower body assist Assist for lower body dressing: Contact Guard/Touching assist (Standing with unilateral UE support on wall)     Toileting Toileting    Toileting assist Assist for toileting: 2 Helpers     Transfers Chair/bed transfer  Transfers assist     Chair/bed transfer assist level: Contact Guard/Touching assist     Locomotion Ambulation   Ambulation assist      Assist level: Moderate Assistance - Patient 50 - 74% Assistive device: Hand held assist Max distance: 300'   Walk 10 feet activity   Assist     Assist level: Moderate Assistance - Patient - 50 - 74% Assistive device: Hand held assist   Walk 50 feet activity   Assist    Assist level: Moderate Assistance - Patient - 50 - 74% Assistive device: Hand held assist    Walk 150 feet activity   Assist    Assist level: Moderate Assistance - Patient - 41 -  74% Assistive device: Hand held assist    Walk 10 feet on uneven surface  activity   Assist Walk 10 feet on uneven surfaces activity did not occur: Safety/medical concerns         Wheelchair     Assist Is the patient using a wheelchair?: No             Wheelchair 50 feet with 2 turns activity    Assist            Wheelchair 150 feet activity     Assist          Blood pressure 132/87, pulse 81, temperature 98 F (36.7 C), temperature source Oral, resp. rate 18, height 5' 6"  (1.676 m), weight 97.6 kg, SpO2 100 %.   Medical Problem List and Plan: 1.  Decline in ADL, mobility , and cognition secondary to anoxic brain  injury -Continue CIR therapies including PT, OT, and SLP    2.  Antithrombotics: -DVT/anticoagulation:  Pharmaceutical: Lovenox             -antiplatelet therapy: N/A 3. Pain Management: Tylenol prn.  4. Mood: LCSW to follow for evaluation and support.              -antipsychotic agents:  - seroquel   -10/6      agitated at times--improving -continue seroquel, 7m qam and 1048mqpm    - mid day dose 5011m -vpa level 51, continue 500m59mD   -added pm celexa 10mg90m mood control, anxiety, increase to 20mg 106mght   -continue enclosure bed for safety 5. Neuropsych: This patient is not capable of making decisions on her own behalf. 6. Skin/Wound Care:  Monitor abdominal wound for healing.             --routine pressure relief measures.   7. Fluids/Electrolytes/Nutrition: encourage PO  -protein supp for low albumin    .   8. Cellulitis abdomen: Completed course of Keflex --through 9/27. Continue to monitor for healing.  9. T2DM with hyperglycemia: Hgb A1c-6.9. Transition to trulicity at discharge?             --CBG (last 3)  Recent Labs    01/19/21 1204 01/19/21 1636 01/19/21 2049  GLUCAP 107* 98 180*     10u semglee in pm and 14u in am   10/7 improving control--obsv with increase am semglee to 16u 10. HTN: Monitor BP tid--continue Cozaar  Controlled  10/7 11. SVT/NSVT: On coreg and amiodarone bid for rate control.   12. Dysphagia:   -continue D3, thins for now 13.  Acute blood loss anemia  Hemoglobin 11.2 on 10/3  14. Constipation:  -moving bowels regularly  -continue current reg   LOS: 18 days A FACE TO FACE [place]2022, 10:23 AM

## 2021-01-20 NOTE — Progress Notes (Signed)
Physical Therapy Session Note  Patient Details  Name: Courtney Davies MRN: [id number] Date of Birth: 1975-08-27  Today's Date: 01/20/2021 PT Individual Time: 1120-1159 and 1219-7588 PT Individual Time Calculation (min): 39 min and 43 min  Short Term Goals: Week 3:  PT Short Term Goal 1 (Week 3): STGs = LTGs  Skilled Therapeutic Interventions/Progress Updates:     Pt received supine in posey bed, asleep. Easily roused and agreeable to therapy. No complaint of pain. Supine to sit with cues for positioning at EOB. Pt cued to don shoes prior to mobility and then cued for safety as she attempts to tie shoes by propping foot on WC. Pt's session focused on pathfinding as well as ambulation outdoors for gait training over unlevel and varying surfaces. Pt requires mod cueing to correctly manage elevator and appears to forget she is in elevator as soon as she enters. Outside, pt provided with verbal cues to effectively utilize handrails she walking up and down steps, stopping and looking both says when approaching crosswalk, and sequencing for transitioning from standing to sitting on bench. Pt ambulates >1000' and completes x12 steps with supervision. Pt left supine in posey bed with all needs within reach.  2nd Session: Pt received supine in posey bed and eager for therapy. No complaint of pain. Pt ambulates to dayroom with close supervision and cues for navigation. Pt completes Nustep activity for 10:00 at workload of 5 with average steps per minute ~45. Activity performed for strength and endurance training and also to challenge pt's attention to task. PT cues pt to "complete activity for 10:00" and pt able to recall cueing with minA. Pt then ambulates to ortho gym, x300' without AD and with cues for navigating in crowded environment. Pt performs activity to test short term memory with ambulation and dynamic balance involved. PT hides latex number 10 in various places around unit. PT then tells pt a cue,  such as "Find the number 10. It's in the bathroom sink." Pt requires mod to max cues throughout, but seems to perform more consistently when cued to repeat PT's hint. Pt required to reach above head and bend over to pick up objects from ground, without physical assistance required.  Pt left supine in posey bed with all needs within reach.  Therapy Documentation Precautions:  Precautions Precautions: Fall Precaution Comments: incontinence, abdominal wound (from PEG tube) Restrictions Weight Bearing Restrictions: No Other Position/Activity Restrictions: posey waist restraint, all bed rails   Therapy/Group: Individual Therapy  Breck Coons, PT, DPT 01/20/2021, 12:37 PM

## 2021-01-20 NOTE — Progress Notes (Signed)
Speech Language Pathology Daily Session Note  Patient Details  Name: Courtney Davies MRN: [id number] Date of Birth: Mar 01, 1976  Today's Date: 01/20/2021 SLP Individual Time: 0725-0810 SLP Individual Time Calculation (min): 45 min  Short Term Goals: Week 3: SLP Short Term Goal 1 (Week 3): STGs=LTGs due to ELOS  Skilled Therapeutic Interventions: Skilled treatment session focused on cognitive goals. SLP facilitated session by providing extra time and Mod verbal and visual cues for patient to locate items on sink to complete basic self-care tasks. Patient completed all subtests of the [facility] Mental Status Examination (SLUMS). Patient scored  9/30 points with a score of 27 or above considered normal (6/30 on initial evaluation). Patient continues to demonstrate severe deficits in recall and problem solving. Patient also re-administered subtests of the [place] Screening Test (MAST) and scored 8/10 points for naming and 10/10 points for automatic sequences which is an improvement since initial administration. Patient left in the secured enclosure bed with all needs within reach. Continue with current plan of care.       Pain No/Denies Pain   Therapy/Group: Individual Therapy  Murrell Dome 01/20/2021, 2:49 PM

## 2021-01-20 NOTE — Progress Notes (Signed)
Occupational Therapy Session Note  Patient Details  Name: Courtney Davies MRN: [id number] Date of Birth: 03-25-1976  Today's Date: 01/20/2021 OT Individual Time: 0847-1000 OT Individual Time Calculation (min): 73 min   Short Term Goals: Week 3:  OT Short Term Goal 3 (Week 3): LTG=STG 2/2 ELOS  Skilled Therapeutic Interventions/Progress Updates:    Pt greeted semi-reclined in enclosure bed and agreeable to OT treatment session. Pt wanted to shower today. Pt wanted to shower in a different room and stated " I never shower in here." OT reminded pt that we shower here in her room on most days, pt agreeable. Bathing completed standing in shower with min verbal cues for safety to hold onto grab bars while washing feet and reaching between legs. Pt needed cues to recall body parts she had already washed. Dressing completed seated EOB with cues to sit down to thread pant legs. Pt frustrated at OT request to sit down as pt is used to standing to step into pants. OT educated on safety and high fall risk. Addressed visual scanning and object naming/locating at the sink. Min cues to locate toothbrush and toothpaste. OT then asked pt orientation questions and names of therapist. Pt said "I, don't know." But when OT asked where she could find out she said "In my book," and was able to locate most of my questions with min cues. Pt ambulated to therapy gym with supervision. Worked on problem solving skills with slide board activity. Pt needed demonstration for problem solving patterns at first, but after repetition, was able to complete a few patters without assistance, and then only needed min cues when she got stuck. Peg board puzzle addressing problem solving and visuospatial skills. Pt needed min cues to space out pegs correctly in more challenging pattern. Pt ambulated in hallway with supervision while discussed plan for dc, OT goals, memory strategies, and safety awareness. Pt returned to room and left  semi-reclined in enclosure bed with needs met.   Therapy Documentation Precautions:  Precautions Precautions: Fall Precaution Comments: incontinence, abdominal wound (from PEG tube) Restrictions Weight Bearing Restrictions: No Other Position/Activity Restrictions: posey waist restraint, all bed rails  Pain: Denies pain  Therapy/Group: Individual Therapy  Valma Cava 01/20/2021, 9:38 AM

## 2021-01-21 LAB — GLUCOSE, CAPILLARY
Glucose-Capillary: 97 mg/dL (ref 70–99)
Glucose-Capillary: 140 mg/dL — ABNORMAL HIGH (ref 70–99)
Glucose-Capillary: 120 mg/dL — ABNORMAL HIGH (ref 70–99)
Glucose-Capillary: 143 mg/dL — ABNORMAL HIGH (ref 70–99)

## 2021-01-21 MED ORDER — INSULIN GLARGINE-YFGN 100 UNIT/ML ~~LOC~~ SOLN
18.0000 [IU] | Freq: Every day | SUBCUTANEOUS | Status: DC
  Administered 2021-01-22 – 2021-01-23 (×2): 18 [IU] via SUBCUTANEOUS
  Filled 2021-01-21 (×3): qty 0.18

## 2021-01-21 NOTE — Progress Notes (Signed)
Occupational Therapy Session Note  Patient Details  Name: Courtney Davies MRN: [id number] Date of Birth: 1975-10-28  Today's Date: 01/21/2021 OT Individual Time: 1000-1100 OT Individual Time Calculation (min): 60 min    Short Term Goals: Week 3:  OT Short Term Goal 3 (Week 3): LTG=STG 2/2 ELOS  Skilled Therapeutic Interventions/Progress Updates:    Pt greeted seated EOB with family present and agreeable to OT treatment session focused on family education with sister and father. OT educated on use of memory notebook and memory packet to answer orientation questions and help recall prior activities. Educated on safety awareness and how to cue patient and still promote problem solving, Discussed using suction grab bar in shower to help steady herself when bathing. Pt completed BADL tasks, toileting, and showering all at supervision level. Pt needed cues to orient clothing and name correct use of clothing, but supervision for dressing and grooming tasks as well. Pt ambulated to therapy apartment and practiced stepping over tub ledge with supervision. Addressed memory with BITS activity using increasing sequence of numbers. Pt able to recall  up to 7 numbers. Then graded task to image/word simulation with pt having much more difficulty relating images and words. Educated pt's sister on how to incorporate object naming into everyday activities. Pt ambulated back to room and left semi-reclined in enclosure bed with family and needs met.   Therapy Documentation Precautions:  Precautions Precautions: Fall Precaution Comments: incontinence, abdominal wound (from PEG tube) Restrictions Weight Bearing Restrictions: No Other Position/Activity Restrictions: posey waist restraint, all bed rails:   Pain: Pain Assessment Pain Scale: 0-10 Pain Score: 0-No pain  Therapy/Group: Individual Therapy  Valma Cava 01/21/2021, 11:01 AM

## 2021-01-21 NOTE — Progress Notes (Signed)
Physical Therapy TBI Note  Patient Details  Name: Courtney Davies MRN: [id number] Date of Birth: 05-Oct-1975  Today's Date: 01/21/2021 PT Individual Time: 1100-1155 PT Individual Time Calculation (min): 55 min   Short Term Goals: Week 3:  PT Short Term Goal 1 (Week 3): STGs = LTGs  Skilled Therapeutic Interventions/Progress Updates:    Pt received seated in chair in room with dad and sister present for family education session. No complaints of pain. Pt transfers sit to stand with Supervision and no AD throughout session. Ambulation 300 ft (+) with no AD and Supervision throughout session, cues to attend to environment and for obstacle avoidance. Education with patient and family regarding safe mobility in the home and the community due to decreased overall safety awareness and insight and awareness of impairments. Pt's family understanding of pt's current assist level and plan for 24/7 Supervision upon d/c home. Pt able to perform car transfer with Supervision with cues for safe transfer technique. Ascend/descend ramp with Supervision, ambulation across uneven ground with no AD and CGA for balance. Patient demonstrates increased fall risk as noted by score of  49/56 on Berg Balance Scale.  (<36= high risk for falls, close to 100%; 37-45 significant >80%; 46-51 moderate >50%; 52-55 lower >25%). Reviewed improvement in score since last assessment but continued fall risk and functional implications. Pt also performs DGI and scores 15/24, indicating high fall risk. Pt left seated in chair in room with needs in reach, quick release belt and chair alarm in place, family present, telesitter present.  Therapy Documentation Precautions:  Precautions Precautions: Fall Precaution Comments: incontinence, abdominal wound (from PEG tube) Restrictions Weight Bearing Restrictions: No Other Position/Activity Restrictions: posey waist restraint, all bed rails  Agitated Behavior Scale: TBI Observation  Details Observation Environment: pt's room Start of observation period - Date: 01/21/21 Start of observation period - Time: 1100 End of observation period - Date: 01/21/21 End of observation period - Time: 1155 Agitated Behavior Scale (DO NOT LEAVE BLANKS) Short attention span, easy distractibility, inability to concentrate: Present to a slight degree Impulsive, impatient, low tolerance for pain or frustration: Present to a slight degree Uncooperative, resistant to care, demanding: Absent Violent and/or threatening violence toward people or property: Absent Explosive and/or unpredictable anger: Absent Rocking, rubbing, moaning, or other self-stimulating behavior: Absent Pulling at tubes, restraints, etc.: Absent Wandering from treatment areas: Present to a slight degree Restlessness, pacing, excessive movement: Present to a slight degree Repetitive behaviors, motor, and/or verbal: Absent Rapid, loud, or excessive talking: Absent Sudden changes of mood: Absent Easily initiated or excessive crying and/or laughter: Absent Self-abusiveness, physical and/or verbal: Absent Agitated behavior scale total score: 18  Balance: Standardized Balance Assessment Standardized Balance Assessment: Berg Balance Test;Dynamic Gait Index  Berg Balance Test Sit to Stand: Able to stand without using hands and stabilize independently Standing Unsupported: Able to stand safely 2 minutes Sitting with Back Unsupported but Feet Supported on Floor or Stool: Able to sit safely and securely 2 minutes Stand to Sit: Sits safely with minimal use of hands Transfers: Able to transfer safely, minor use of hands Standing Unsupported with Eyes Closed: Able to stand 10 seconds safely Standing Ubsupported with Feet Together: Able to place feet together independently and stand 1 minute safely From Standing, Reach Forward with Outstretched Arm: Can reach forward >5 cm safely (2") From Standing Position, Pick up Object from  Floor: Able to pick up shoe, needs supervision From Standing Position, Turn to Look Behind Over each Shoulder: Looks behind from  both sides and weight shifts well Turn 360 Degrees: Needs close supervision or verbal cueing Standing Unsupported, Alternately Place Feet on Step/Stool: Able to stand independently and complete 8 steps >20 seconds Standing Unsupported, One Foot in Front: Able to place foot tandem independently and hold 30 seconds Standing on One Leg: Able to lift leg independently and hold > 10 seconds Total Score: 49  Dynamic Gait Index Level Surface: Mild Impairment Change in Gait Speed: Mild Impairment Gait with Horizontal Head Turns: Moderate Impairment Gait with Vertical Head Turns: Mild Impairment Gait and Pivot Turn: Mild Impairment Step Over Obstacle: Mild Impairment Step Around Obstacles: Mild Impairment Steps: Mild Impairment Total Score: 15     Therapy/Group: Individual Therapy   Excell Seltzer, PT, DPT, CSRS  01/21/2021, 11:59 AM

## 2021-01-21 NOTE — Progress Notes (Signed)
PROGRESS NOTE   Subjective/Complaints: Seems to have slept most of night. Was resting when I came in this morning  ROS: Limited due to cognitive/behavioral    Objective:   No results found. No results for input(s): WBC, HGB, HCT, PLT in the last 72 hours.    No results for input(s): NA, K, CL, CO2, GLUCOSE, BUN, CREATININE, CALCIUM in the last 72 hours.     Intake/Output Summary (Last 24 hours) at 01/21/2021 1053 Last data filed at 01/20/2021 1847 Gross per 24 hour  Intake 480 ml  Output --  Net 480 ml        Physical Exam: Vital Signs Blood pressure 126/88, pulse 69, temperature 98.1 F (36.7 C), resp. rate 14, height 5' 6"  (1.676 m), weight 97.5 kg, SpO2 100 %.    Constitutional: No distress . Vital signs reviewed. HEENT: NCAT, EOMI, oral membranes moist Neck: supple Cardiovascular: RRR without murmur. No JVD    Respiratory/Chest: CTA Bilaterally without wheezes or rales. Normal effort    GI/Abdomen: BS +, non-tender, non-distended Ext: no clubbing, cyanosis, or edema Psych: impulsive  Neuro: remains alert, distracted and impulsive but improving. . Language more appropriate and on point.  Motor: Grossly 5/5. Good standing balance Musc: no pain with ROM  Assessment/Plan: 1. Functional deficits which require 3+ hours per day of interdisciplinary therapy in a comprehensive inpatient rehab setting. Physiatrist is providing close team supervision and 24 hour management of active medical problems listed below. Physiatrist and rehab team continue to assess barriers to discharge/monitor patient progress toward functional and medical goals  Care Tool:  Bathing    Body parts bathed by patient: Right arm, Left arm, Chest, Abdomen, Front perineal area, Buttocks, Right upper leg, Left upper leg, Right lower leg, Left lower leg, Face   Body parts bathed by helper: Buttocks     Bathing assist Assist Level:  Supervision/Verbal cueing (Cueing for safety/sequencing/recall)     Upper Body Dressing/Undressing Upper body dressing   What is the patient wearing?: Pull over shirt    Upper body assist Assist Level: Supervision/Verbal cueing    Lower Body Dressing/Undressing Lower body dressing      What is the patient wearing?: Pants, Underwear/pull up     Lower body assist Assist for lower body dressing: Contact Guard/Touching assist (Standing with unilateral UE support on wall)     Toileting Toileting    Toileting assist Assist for toileting: 2 Helpers     Transfers Chair/bed transfer  Transfers assist     Chair/bed transfer assist level: Contact Guard/Touching assist     Locomotion Ambulation   Ambulation assist      Assist level: Moderate Assistance - Patient 50 - 74% Assistive device: Hand held assist Max distance: 300'   Walk 10 feet activity   Assist     Assist level: Moderate Assistance - Patient - 50 - 74% Assistive device: Hand held assist   Walk 50 feet activity   Assist    Assist level: Moderate Assistance - Patient - 50 - 74% Assistive device: Hand held assist    Walk 150 feet activity   Assist    Assist level: Moderate Assistance - Patient -  50 - 74% Assistive device: Hand held assist    Walk 10 feet on uneven surface  activity   Assist Walk 10 feet on uneven surfaces activity did not occur: Safety/medical concerns         Wheelchair     Assist Is the patient using a wheelchair?: No             Wheelchair 50 feet with 2 turns activity    Assist            Wheelchair 150 feet activity     Assist          Blood pressure 126/88, pulse 69, temperature 98.1 F (36.7 C), resp. rate 14, height 5' 6"  (1.676 m), weight 97.5 kg, SpO2 100 %.   Medical Problem List and Plan: 1.  Decline in ADL, mobility , and cognition secondary to anoxic brain injury -Continue CIR therapies including PT, OT, and SLP      2.  Antithrombotics: -DVT/anticoagulation:  Pharmaceutical: Lovenox             -antiplatelet therapy: N/A 3. Pain Management: Tylenol prn.  4. Mood: LCSW to follow for evaluation and support.              -antipsychotic agents:  - seroquel   -10/6      agitated at times--improving -continue seroquel, 84m qam and 1026mqpm    - mid day dose 5078m -vpa level 51, continue 500m25mD   -added HS celexa 10mg44m mood control, anxiety, increased to 20mg 4m   -continue enclosure bed for safety 5. Neuropsych: This patient is not capable of making decisions on her own behalf. 6. Skin/Wound Care:  Monitor abdominal wound for healing.             --routine pressure relief measures.   7. Fluids/Electrolytes/Nutrition: encourage PO  -protein supp for low albumin    .   8. Cellulitis abdomen: Completed course of Keflex --through 9/27. Continue to monitor for healing.  9. T2DM with hyperglycemia: Hgb A1c-6.9. Transition to trulicity at discharge?             --CBG (last 3)  Recent Labs    01/20/21 1637 01/20/21 2136 01/21/21 0638  GLUCAP 196* 116* 97     10u semglee in pm and 14u in am   10/8  improving--obsv with increase am semglee to 16u 10. HTN: Monitor BP tid--continue Cozaar  Controlled  10/8 11. SVT/NSVT: On coreg and amiodarone bid for rate control.   12. Dysphagia:   -continue D3, thins for now 13.  Acute blood loss anemia  Hemoglobin 11.2 on 10/3  14. Constipation:  -moving bowels regularly  -continue current reg   [place]: 19 [place], 10[place]53 [place]

## 2021-01-21 NOTE — Progress Notes (Signed)
Speech Language Pathology Daily Session Note  Patient Details  Name: Courtney Davies MRN: [id number] Date of Birth: 12/02/1975  Today's Date: 01/21/2021 SLP Individual Time: 0900-1000 SLP Individual Time Calculation (min): 60 min  Short Term Goals: Week 3: SLP Short Term Goal 1 (Week 3): STGs=LTGs due to ELOS  Skilled Therapeutic Interventions:   Patient seen with sister and father both present for skilled ST session focused on family education and addressing cognitive goals. Patient was alert, pleasant and engaged in conversations during session. She required modA verbal and gestural cues to locate items such as memory book but when present, she was able to describe in basic terms function and reasoning for these materials. She required modA verbal cues to redirect when she was fixated on a topic (thinking her son's phone was missing) or to demonstrate problem solving (going to other side(closed side) of enclosure bed to try to get something when she had been standing right next to the open side. SLP discussed reasoning for patient's solid food textures being mechanical soft, discussed her need for 24 hour supervision for safety, suggestions of cognitive tasks such as finding items in cupboards to make simple foods, having a daily routine/schedule with checkboxes due to patient not recalling things that happened, keeping all memory materials/aides in the same location and avoiding clutter or too many papers, etc that could overwhelm her. We discussed her difficulty and frustration with word finding errors and suggestions for working through these times. Family asked appropriate questions and patient left with sister, father and OT who had just arrived. She continues to benefit from skilled SLP intervention to maximize cognitive-linguistic function prior to discharge.  Pain Pain Assessment Pain Scale: 0-10 Pain Score: 0-No pain  Therapy/Group: Individual Therapy  Sonia Baller, MA,  CCC-SLP Speech Therapy

## 2021-01-22 LAB — GLUCOSE, CAPILLARY
Glucose-Capillary: 87 mg/dL (ref 70–99)
Glucose-Capillary: 128 mg/dL — ABNORMAL HIGH (ref 70–99)
Glucose-Capillary: 131 mg/dL — ABNORMAL HIGH (ref 70–99)
Glucose-Capillary: 189 mg/dL — ABNORMAL HIGH (ref 70–99)

## 2021-01-22 MED ORDER — DIVALPROEX SODIUM 125 MG PO CSDR
750.0000 mg | DELAYED_RELEASE_CAPSULE | Freq: Three times a day (TID) | ORAL | Status: DC
  Administered 2021-01-22 – 2021-01-24 (×6): 750 mg via ORAL
  Filled 2021-01-22 (×6): qty 6

## 2021-01-22 MED ORDER — LORAZEPAM 2 MG/ML IJ SOLN: 0.5000 mg | INTRAMUSCULAR | Status: DC | PRN

## 2021-01-22 MED ORDER — LORAZEPAM 0.5 MG PO TABS
0.5000 mg | ORAL_TABLET | Freq: Four times a day (QID) | ORAL | Status: DC | PRN
Start: 2021-01-22 — End: 2021-01-22

## 2021-01-22 NOTE — Progress Notes (Signed)
   01/22/21 1500  Hygiene  Oral Care Teeth brushed  Hygiene Shower;Peri care  Level of Assistance Independent    Pt received shower.

## 2021-01-22 NOTE — Progress Notes (Signed)
PROGRESS NOTE   Subjective/Complaints: Seems to have slept most of night. Was resting when I came in this morning  ROS: Limited due to cognitive/behavioral    Objective:   No results found. No results for input(s): WBC, HGB, HCT, PLT in the last 72 hours.    No results for input(s): NA, K, CL, CO2, GLUCOSE, BUN, CREATININE, CALCIUM in the last 72 hours.     Intake/Output Summary (Last 24 hours) at 01/22/2021 0904 Last data filed at 01/21/2021 1847 Gross per 24 hour  Intake 120 ml  Output --  Net 120 ml        Physical Exam: Vital Signs Blood pressure (!) 124/59, pulse 61, temperature (!) 97.3 F (36.3 C), temperature source Oral, resp. rate 17, height 5' 6"  (1.676 m), weight 97.1 kg, SpO2 100 %.    Constitutional: No distress . Vital signs reviewed. HEENT: NCAT, EOMI, oral membranes moist Neck: supple Cardiovascular: RRR without murmur. No JVD    Respiratory/Chest: CTA Bilaterally without wheezes or rales. Normal effort    GI/Abdomen: BS +, non-tender, non-distended, PEG Ext: no clubbing, cyanosis, or edema Psych: impulsive  Neuro: remains alert, distracted and impulsive but improving. Can perseverate. Language is more appropriate and on point.  Motor:   5/5.  No sensory abnl Musc: no pain with ROM  Assessment/Plan: 1. Functional deficits which require 3+ hours per day of interdisciplinary therapy in a comprehensive inpatient rehab setting. Physiatrist is providing close team supervision and 24 hour management of active medical problems listed below. Physiatrist and rehab team continue to assess barriers to discharge/monitor patient progress toward functional and medical goals  Care Tool:  Bathing    Body parts bathed by patient: Right arm, Left arm, Chest, Abdomen, Front perineal area, Buttocks, Right upper leg, Left upper leg, Right lower leg, Left lower leg, Face   Body parts bathed by helper:  Buttocks     Bathing assist Assist Level: Supervision/Verbal cueing (Cueing for safety/sequencing/recall)     Upper Body Dressing/Undressing Upper body dressing   What is the patient wearing?: Pull over shirt    Upper body assist Assist Level: Supervision/Verbal cueing    Lower Body Dressing/Undressing Lower body dressing      What is the patient wearing?: Pants, Underwear/pull up     Lower body assist Assist for lower body dressing: Contact Guard/Touching assist (Standing with unilateral UE support on wall)     Toileting Toileting    Toileting assist Assist for toileting: 2 Helpers     Transfers Chair/bed transfer  Transfers assist     Chair/bed transfer assist level: Supervision/Verbal cueing     Locomotion Ambulation   Ambulation assist      Assist level: Supervision/Verbal cueing Assistive device: No Device Max distance: 300'   Walk 10 feet activity   Assist     Assist level: Supervision/Verbal cueing Assistive device: No Device   Walk 50 feet activity   Assist    Assist level: Supervision/Verbal cueing Assistive device: No Device    Walk 150 feet activity   Assist    Assist level: Supervision/Verbal cueing Assistive device: No Device    Walk 10 feet on uneven surface  activity   Assist Walk 10 feet on uneven surfaces activity did not occur: Safety/medical concerns   Assist level: Contact Guard/Touching assist     Wheelchair     Assist Is the patient using a wheelchair?: No             Wheelchair 50 feet with 2 turns activity    Assist            Wheelchair 150 feet activity     Assist          Blood pressure (!) 124/59, pulse 61, temperature (!) 97.3 F (36.3 C), temperature source Oral, resp. rate 17, height 5' 6"  (1.676 m), weight 97.1 kg, SpO2 100 %.   Medical Problem List and Plan: 1.  Decline in ADL, mobility , and cognition secondary to anoxic brain injury -Continue CIR therapies  including PT, OT, and SLP     2.  Antithrombotics: -DVT/anticoagulation:  Pharmaceutical: Lovenox             -antiplatelet therapy: N/A 3. Pain Management: Tylenol prn.  4. Mood: LCSW to follow for evaluation and support.              -antipsychotic agents:  - seroquel   -10/9      agitation--gradually improving. Still can ramp up at times, can perseverate -continue seroquel, 35m BID and 1079mqpm        -vpa level 51, will increase to 750 mg TID--recheck level prior to dc   -added HS celexa 109mor mood control, anxiety, increased to 77m19m/7   -continue enclosure bed for safety   -reduce prn ativan 5. Neuropsych: This patient is not capable of making decisions on her own behalf. 6. Skin/Wound Care:  Monitor abdominal wound for healing.             --routine pressure relief measures.   7. Fluids/Electrolytes/Nutrition: encourage PO  -protein supp for low albumin    .   8. Cellulitis abdomen: Completed course of Keflex --through 9/27. Continue to monitor for healing.  9. T2DM with hyperglycemia: Hgb A1c-6.9. Transition to trulicity at discharge?             --CBG (last 3)  Recent Labs    01/21/21 1614 01/21/21 2102 01/22/21 0608  GLUCAP 120* 143* 87     10u semglee in pm and 14u in am   10/9  improving-- increased am semglee to 16u 10. HTN: Monitor BP tid--continue Cozaar  Controlled  10/9 11. SVT/NSVT: On coreg and amiodarone bid for rate control.   12. Dysphagia:   -continue D3, thins for now 13.  Acute blood loss anemia  Hemoglobin 11.2 on 10/3  14. Constipation:  -moving bowels regularly  -continue current reg   [place]: 20 days [place]2022, 9:04 AM

## 2021-01-23 LAB — GLUCOSE, CAPILLARY
Glucose-Capillary: 104 mg/dL — ABNORMAL HIGH (ref 70–99)
Glucose-Capillary: 103 mg/dL — ABNORMAL HIGH (ref 70–99)
Glucose-Capillary: 58 mg/dL — ABNORMAL LOW (ref 70–99)
Glucose-Capillary: 147 mg/dL — ABNORMAL HIGH (ref 70–99)
Glucose-Capillary: 170 mg/dL — ABNORMAL HIGH (ref 70–99)

## 2021-01-23 LAB — BASIC METABOLIC PANEL
Anion gap: 8 (ref 5–15)
BUN: 12 mg/dL (ref 6–20)
CO2: 23 mmol/L (ref 22–32)
Calcium: 8.7 mg/dL — ABNORMAL LOW (ref 8.9–10.3)
Chloride: 107 mmol/L (ref 98–111)
Creatinine, Ser: 0.87 mg/dL (ref 0.44–1.00)
GFR, Estimated: >60 mL/min (ref >60)
Glucose, Bld: 97 mg/dL (ref 70–99)
Potassium: 4.2 mmol/L (ref 3.5–5.1)
Sodium: 138 mmol/L (ref 135–145)

## 2021-01-23 NOTE — Plan of Care (Signed)
  Problem: RH Balance Goal: LTG: Patient will maintain dynamic sitting balance (OT) Description: LTG:  Patient will maintain dynamic sitting balance with assistance during activities of daily living (OT) Outcome: Completed/Met   Problem: Sit to Stand Goal: LTG:  Patient will perform sit to stand in prep for activites of daily living with assistance level (OT) Description: LTG:  Patient will perform sit to stand in prep for activites of daily living with assistance level (OT) Outcome: Completed/Met   Problem: RH Grooming Goal: LTG Patient will perform grooming w/assist,cues/equip (OT) Description: LTG: Patient will perform grooming with assist, with/without cues using equipment (OT) Outcome: Completed/Met   Problem: RH Bathing Goal: LTG Patient will bathe all body parts with assist levels (OT) Description: LTG: Patient will bathe all body parts with assist levels (OT) Outcome: Completed/Met   Problem: RH Dressing Goal: LTG Patient will perform upper body dressing (OT) Description: LTG Patient will perform upper body dressing with assist, with/without cues (OT). Outcome: Completed/Met Goal: LTG Patient will perform lower body dressing w/assist (OT) Description: LTG: Patient will perform lower body dressing with assist, with/without cues in positioning using equipment (OT) Outcome: Completed/Met   Problem: RH Toileting Goal: LTG Patient will perform toileting task (3/3 steps) with assistance level (OT) Description: LTG: Patient will perform toileting task (3/3 steps) with assistance level (OT)  Outcome: Completed/Met   Problem: RH Attention Goal: LTG Patient will demonstrate this level of attention during functional activites (OT) Description: LTG:  Patient will demonstrate this level of attention during functional activites  (OT) Outcome: Completed/Met   Problem: RH Awareness Goal: LTG: Patient will demonstrate awareness during functional activites type of (OT) Description: LTG:  Patient will demonstrate awareness during functional activites type of (OT) Outcome: Completed/Met

## 2021-01-23 NOTE — Progress Notes (Signed)
Speech Language Pathology Daily Session Note  Patient Details  Name: Courtney Davies MRN: [id number] Date of Birth: 12-30-1975  Today's Date: 01/23/2021 SLP Individual Time: 0810-0905 SLP Individual Time Calculation (min): 55 min  Short Term Goals: Week 3: SLP Short Term Goal 1 (Week 3): STGs=LTGs due to ELOS   Skilled Therapeutic Interventions: Skilled treatment session focused on cognitive goals. SLP facilitated session by providing overall supervision level verbal cues for problem solving during basic self-care tasks at the sink. Patient consumed breakfast meal of Dys. 3 textures with thin liquids without overt s/s of aspiration but continues to require overall supervision level verbal cues for use of small bite/sips and a slow rate of self-feeding. Patient participated in 4-step picture sequencing task in which she required Mod verbal cues for problem solving. Mod A verbal, visual and phonemic cues were also needed for word-finding and awareness of errors while verbally describing the actions within the pictures. Patient left secured in enclosure bed with all needs within reach. Continue with current plan of care.      Pain Pain Assessment Pain Scale: 0-10 Pain Score: 0-No pain  Therapy/Group: Individual Therapy  Courtney Davies 01/23/2021, 10:11 AM

## 2021-01-23 NOTE — Progress Notes (Signed)
Occupational Therapy Discharge Summary  Patient Details  Name: Courtney Davies MRN: [id number] Date of Birth: December 13, 1975  Today's Date: 01/23/2021 OT Individual Time: 5284-1324 OT Individual Time Calculation (min): 57 min   Pt greeted seated in enclosure bed and agreeable to shower. Pt perseverating on finding another phone. OT redirected patient to get in the shower. Supervision for all BADL tasks with min cues for safety awareness and to locate BADL items. Pt ambulated to therapy gym and discussed dc plan, OT goals, safety awareness, and progress. OT had pt practice stepping over tub ledge with supervision and educated on safety modifications with this task. Pt returned to room and was left seated EOB with nurse tech present and needs met.    Patient has met 9 of 9 long term goals due to improved activity tolerance, improved balance, postural control, ability to compensate for deficits, improved attention, and improved coordination.  Patient to discharge at overall Supervision level.  Patient's care partner is independent to provide the necessary cognitive assistance at discharge.    Reasons goals not met: n/a  Recommendation:  Patient will benefit from ongoing skilled OT services in outpatient setting to continue to advance functional skills in the area of BADL and iADL.  Equipment: No equipment provided  Reasons for discharge: treatment goals met and discharge from hospital  Patient/family agrees with progress made and goals achieved: Yes  OT Discharge Precautions/Restrictions  Precautions Precautions: Fall Restrictions Weight Bearing Restrictions: No Pain Pain Assessment Pain Scale: 0-10 Pain Score: 0-No pain Multiple Pain Sites: No ADL ADL Eating: Supervision/safety Grooming: Supervision/safety Where Assessed-Grooming: Edge of bed Upper Body Bathing: Supervision/safety Where Assessed-Upper Body Bathing: Edge of bed Lower Body Bathing: Supervision/safety Where  Assessed-Lower Body Bathing: Edge of bed Upper Body Dressing: Supervision/safety Where Assessed-Upper Body Dressing: Edge of bed Lower Body Dressing: Supervision/safety Where Assessed-Lower Body Dressing: Edge of bed Toileting: Supervision/safety Toilet Transfer: Distant supervision Tub/Shower Transfer: Distant supervision ADL Comments: could not assess due to pt agitation. pt refused any ADLs. Vision Baseline Vision/History: 0 No visual deficits Eye Alignment: Within Functional Limits Perception  Perception: Within Functional Limits Praxis Praxis: Intact Praxis Impairment Details: Motor planning Cognition Overall Cognitive Status: Impaired/Different from baseline Arousal/Alertness: Awake/alert Orientation Level: Oriented to place;Oriented to situation;Oriented to person Year: 2023 Month: February Day of Week: Correct Attention: Sustained Sustained Attention: Impaired Sustained Attention Impairment: Verbal basic;Functional basic Memory: Impaired Memory Impairment: Decreased short term memory Immediate Memory Recall: Blue;Sock;Bed Memory Recall Sock: Not able to recall Memory Recall Blue: With Cue Memory Recall Bed: Not able to recall Awareness: Impaired Awareness Impairment: Intellectual impairment Problem Solving: Impaired Problem Solving Impairment: Verbal basic;Functional basic Reasoning: Impaired Sequencing: Impaired Organizing: Impaired Decision Making: Impaired Initiating: Impaired Self Monitoring: Impaired Behaviors: Restless Safety/Judgment: Impaired [employer] Scales of Cognitive Functioning: Confused/appropriate Sensation Sensation Light Touch: Appears Intact Coordination Gross Motor Movements are Fluid and Coordinated: Yes Fine Motor Movements are Fluid and Coordinated: Yes Motor  Motor Motor: Within Functional Limits Mobility  Bed Mobility Bed Mobility: Supine to Sit;Sit to Supine Supine to Sit: Independent Sitting - Scoot to Edge of Bed:  Independent Sit to Supine: Independent Transfers Sit to Stand: Supervision/Verbal cueing Stand to Sit: Supervision/Verbal cueing  Trunk/Postural Assessment  Cervical Assessment Cervical Assessment:  (forward head) Thoracic Assessment Thoracic Assessment:  (rounded shoulders) Lumbar Assessment Lumbar Assessment:  (posterior pelvic tilt) Postural Control Postural Control: Within Functional Limits  Balance Balance Balance Assessed: Yes Static Sitting Balance Static Sitting - Balance Support: Feet supported Static Sitting -  Level of Assistance: 7: Independent Dynamic Sitting Balance Dynamic Sitting - Balance Support: Feet supported Dynamic Sitting - Level of Assistance: 7: Independent Static Standing Balance Static Standing - Balance Support: During functional activity Static Standing - Level of Assistance: 5: Stand by assistance Dynamic Standing Balance Dynamic Standing - Balance Support: During functional activity Dynamic Standing - Level of Assistance: 5: Stand by assistance Extremity/Trunk Assessment RUE Assessment RUE Assessment: Within Functional Limits LUE Assessment LUE Assessment: Within Functional Limits   Daneen Schick Tige Meas 01/23/2021, 3:40 PM

## 2021-01-23 NOTE — Progress Notes (Signed)
Patient ID: Courtney Davies, female   DOB: 07-31-1975, 45 y.o.   MRN: [id number]  SW confirmed referral was received with [facility] (p:[phone]/[phone]) however requested records to be sent again since fax too light. SW faxed referral again.  *SW confirmed fax received.   [name], MSW, [place] Office: [phone] Cell: [phone] Fax: [phone]

## 2021-01-23 NOTE — Progress Notes (Signed)
Physical Therapy Discharge Summary  Patient Details  Name: Courtney Davies MRN: [id number] Date of Birth: 11-29-1975  Today's Date: 01/23/2021 PT Individual Time: 0932-6712 PT Individual Time Calculation (min): 73 min    Patient has met 9 of 9 long term goals due to improved activity tolerance, improved balance, improved postural control, increased strength, improved attention, improved awareness, and improved coordination.  Patient to discharge at an ambulatory level Supervision.   Patient's care partner is independent to provide the necessary cognitive assistance at discharge.  Reasons goals not met: NA  Recommendation:  Patient will benefit from ongoing skilled PT services in outpatient setting to continue to advance safe functional mobility, address ongoing impairments in balance, ambulation, safety awareness, and minimize fall risk.  Equipment: No equipment provided  Reasons for discharge: treatment goals met and discharge from hospital  Patient/family agrees with progress made and goals achieved: Yes  Skilled Therapeutic Interventions: Pt received supine in bed and agrees to therapy. No complaint of pain. Supine to sit independently. Pt dons shoes while seated at EOB with setup assistance. Pt performs all mobility during session with supervision for safety awareness. Sit to stand and ambulation x300' to therapy gym without AD. PT cues for navigation and upright gaze to improve posture and balance. Pt completes x12 steps with bilateral hand rails and cues to utilize hand rails for safety. Pt then ambulates outside, tasked with managing elevator and able to correctly utilize buttons with minimal cueing. Pt ambulates outdoors for gait training over unlevel ground and varying surfaces. PT cues to "look both ways" at intersection, and to ensure that pt is walking in middle of sidewalk for safety due to tendency to veer to both sides. Pt ambulates inside and completes Nustep for 15:00 at  workload of 4 with average steps per minute ~45. Completed for strength and endurance training. Pt ambulates to ortho gym and completes car transfer with cues for sequencing. Ambulates back to room. Left supine with alarm intact and all needs within reach.  PT Discharge Precautions/Restrictions Precautions Precautions: Fall Restrictions Weight Bearing Restrictions: No Pain Pain Assessment Pain Scale: 0-10 Pain Score: 0-No pain Multiple Pain Sites: No Pain Interference Pain Interference Pain Effect on Sleep: 1. Rarely or not at all Pain Interference with Therapy Activities: 1. Rarely or not at all Pain Interference with Day-to-Day Activities: 1. Rarely or not at all Vision/Perception  Perception Perception: Within Functional Limits Praxis Praxis: Intact  Cognition Overall Cognitive Status: Impaired/Different from baseline Arousal/Alertness: Awake/alert Orientation Level: Oriented to place;Oriented to situation;Disoriented to person Year: 2023 Month: February Day of Week: Correct Attention: Sustained Memory: Impaired Memory Impairment: Decreased short term memory Reasoning: Impaired Safety/Judgment: Impaired [employer] Scales of Cognitive Functioning: Confused/appropriate Sensation Sensation Light Touch: Appears Intact Coordination Gross Motor Movements are Fluid and Coordinated: Yes Fine Motor Movements are Fluid and Coordinated: Yes Motor  Motor Motor: Within Functional Limits  Mobility Bed Mobility Bed Mobility: Supine to Sit;Sit to Supine Supine to Sit: Independent Sitting - Scoot to Edge of Bed: Independent Sit to Supine: Independent Transfers Transfers: Stand Pivot Transfers;Stand to Sit;Sit to Stand Sit to Stand: Supervision/Verbal cueing Stand to Sit: Supervision/Verbal cueing Stand Pivot Transfer Details: Verbal cues for sequencing;Verbal cues for technique Transfer (Assistive device): None Locomotion  Gait Ambulation: Yes Gait Assistance:  Supervision/Verbal cueing Gait Distance (Feet): 1000 Feet Assistive device: None Gait Assistance Details: Verbal cues for technique;Verbal cues for gait pattern Gait Gait: Yes Gait Pattern: Within Functional Limits Stairs / Additional Locomotion Stairs: Yes Stairs  Assistance: Supervision/Verbal cueing Stair Management Technique: Two rails Number of Stairs: 12 Height of Stairs: 6 Ramp: Supervision/Verbal cueing Curb: Supervision/Verbal cueing Wheelchair Mobility Wheelchair Mobility: No  Trunk/Postural Assessment  Cervical Assessment Cervical Assessment:  (forward head) Thoracic Assessment Thoracic Assessment:  (rounded shoulders) Lumbar Assessment Lumbar Assessment:  (posterior pelvic tilt) Postural Control Postural Control: Within Functional Limits  Balance Balance Balance Assessed: Yes Static Sitting Balance Static Sitting - Balance Support: Feet supported Static Sitting - Level of Assistance: 7: Independent Dynamic Sitting Balance Dynamic Sitting - Balance Support: Feet supported Dynamic Sitting - Level of Assistance: 7: Independent Static Standing Balance Static Standing - Balance Support: During functional activity Static Standing - Level of Assistance: 5: Stand by assistance Dynamic Standing Balance Dynamic Standing - Balance Support: During functional activity Dynamic Standing - Level of Assistance: 5: Stand by assistance Extremity Assessment  RLE Assessment RLE Assessment: Within Functional Limits LLE Assessment LLE Assessment: Within Functional Limits    Breck Coons, PT, DPT 01/23/2021, 3:15 PM

## 2021-01-23 NOTE — Progress Notes (Signed)
Two orange juices given for low blood sugar-pt is asymptomatic

## 2021-01-23 NOTE — Progress Notes (Signed)
PROGRESS NOTE   Subjective/Complaints: Pt had a good night. Up in bed about to go therapy. Happy about her dc date tomorrow!  ROS: Patient denies fever, rash, sore throat, blurred vision, nausea, vomiting, diarrhea, cough, shortness of breath or chest pain, joint or back pain, headache, or mood change.    Objective:   No results found. No results for input(s): WBC, HGB, HCT, PLT in the last 72 hours.    Recent Labs    01/23/21 0835  NA 138  K 4.2  CL 107  CO2 23  GLUCOSE 97  BUN 12  CREATININE 0.87  CALCIUM 8.7*       Intake/Output Summary (Last 24 hours) at 01/23/2021 1034 Last data filed at 01/23/2021 0835 Gross per 24 hour  Intake 557 ml  Output --  Net 557 ml        Physical Exam: Vital Signs Blood pressure (!) 142/94, pulse 61, temperature 97.6 F (36.4 C), temperature source Oral, resp. rate 18, height 5' 6"  (1.676 m), weight 97.3 kg, SpO2 100 %.    Constitutional: No distress . Vital signs reviewed. HEENT: NCAT, EOMI, oral membranes moist Neck: supple Cardiovascular: RRR without murmur. No JVD    Respiratory/Chest: CTA Bilaterally without wheezes or rales. Normal effort    GI/Abdomen: BS +, non-tender, non-distended Ext: no clubbing, cyanosis, or edema Psych: pleasant, still slightly impulsive. Very engaging! Neuro: remains alert, distracted and impulsive but improving. Can perseverate. Language is more appropriate and on point.  Motor:   5/5.  Moves all 4's. Good standing and sitting balance Musc: no pain with ROM  Assessment/Plan: 1. Functional deficits which require 3+ hours per day of interdisciplinary therapy in a comprehensive inpatient rehab setting. Physiatrist is providing close team supervision and 24 hour management of active medical problems listed below. Physiatrist and rehab team continue to assess barriers to discharge/monitor patient progress toward functional and medical  goals  Care Tool:  Bathing    Body parts bathed by patient: Right arm, Left arm, Chest, Abdomen, Front perineal area, Buttocks, Right upper leg, Left upper leg, Right lower leg, Left lower leg, Face   Body parts bathed by helper: Buttocks     Bathing assist Assist Level: Supervision/Verbal cueing (Cueing for safety/sequencing/recall)     Upper Body Dressing/Undressing Upper body dressing   What is the patient wearing?: Pull over shirt    Upper body assist Assist Level: Supervision/Verbal cueing    Lower Body Dressing/Undressing Lower body dressing      What is the patient wearing?: Pants, Underwear/pull up     Lower body assist Assist for lower body dressing: Contact Guard/Touching assist (Standing with unilateral UE support on wall)     Toileting Toileting    Toileting assist Assist for toileting: 2 Helpers     Transfers Chair/bed transfer  Transfers assist     Chair/bed transfer assist level: Supervision/Verbal cueing     Locomotion Ambulation   Ambulation assist      Assist level: Supervision/Verbal cueing Assistive device: No Device Max distance: 300'   Walk 10 feet activity   Assist     Assist level: Supervision/Verbal cueing Assistive device: No Device   Walk  50 feet activity   Assist    Assist level: Supervision/Verbal cueing Assistive device: No Device    Walk 150 feet activity   Assist    Assist level: Supervision/Verbal cueing Assistive device: No Device    Walk 10 feet on uneven surface  activity   Assist Walk 10 feet on uneven surfaces activity did not occur: Safety/medical concerns   Assist level: Contact Guard/Touching assist     Wheelchair     Assist Is the patient using a wheelchair?: No             Wheelchair 50 feet with 2 turns activity    Assist            Wheelchair 150 feet activity     Assist          Blood pressure (!) 142/94, pulse 61, temperature 97.6 F (36.4  C), temperature source Oral, resp. rate 18, height 5' 6"  (1.676 m), weight 97.3 kg, SpO2 100 %.   Medical Problem List and Plan: 1.  Decline in ADL, mobility , and cognition secondary to anoxic brain injury -Continue CIR therapies including PT, OT, and SLP   -spoke with pt's sister yesterday regarding meds, expectations, recovery, etc    2.  Antithrombotics: -DVT/anticoagulation:  Pharmaceutical: Lovenox             -antiplatelet therapy: N/A 3. Pain Management: Tylenol prn.  4. Mood: LCSW to follow for evaluation and support.              -antipsychotic agents:  - seroquel   -10/9      agitation--gradually improving. Still can ramp up at times, can perseverate -continue seroquel, 43m BID and 1087mqpm        -vpa level 51, will increase to 750 mg TID--recheck level prior to dc   -added HS celexa 1046mor mood control, anxiety, increased to 30m14m/7   -continue enclosure bed for safety until dc   -stopped prn ativan 5. Neuropsych: This patient is not capable of making decisions on her own behalf. 6. Skin/Wound Care:  Monitor abdominal wound for healing.             --routine pressure relief measures.   7. Fluids/Electrolytes/Nutrition: eating well  -protein supp for low albumin   I personally reviewed all of the patient's labs today, and lab work is within normal limits.    8. Cellulitis abdomen: Completed course of Keflex --through 9/27. Continue to monitor for healing.  9. T2DM with hyperglycemia: Hgb A1c-6.9. Transition to trulicity at discharge?             --CBG (last 3)  Recent Labs    01/22/21 1611 01/22/21 2059 01/23/21 0558  GLUCAP 131* 189* 104*         10/9 - increased am semglee to 18u  10/10 observe today 10. HTN: Monitor BP tid--continue Cozaar  Controlled  10/10 11. SVT/NSVT: On coreg and amiodarone bid for rate control.   12. Dysphagia:   -continue D3, thins for now 13.  Acute blood loss anemia  Hemoglobin 11.2 on 10/3  14. Constipation:  -moving bowels  regularly  -continue current reg   LOS: 21 days A FACE TO FACESaraland10/2022, 10:34 AM

## 2021-01-24 ENCOUNTER — Other Ambulatory Visit (HOSPITAL_COMMUNITY): Payer: Self-pay

## 2021-01-24 LAB — GLUCOSE, CAPILLARY: Glucose-Capillary: 74 mg/dL (ref 70–99)

## 2021-01-24 MED ORDER — ACETAMINOPHEN 325 MG PO TABS
650.0000 mg | ORAL_TABLET | Freq: Four times a day (QID) | ORAL | 0 refills | Status: DC
  Filled 2021-01-24: qty 200, 25d supply, fill #0

## 2021-01-24 MED ORDER — CITALOPRAM HYDROBROMIDE 20 MG PO TABS
20.0000 mg | ORAL_TABLET | Freq: Every day | ORAL | 0 refills | Status: DC
  Filled 2021-01-24: qty 30, 30d supply, fill #0

## 2021-01-24 MED ORDER — ALBUTEROL SULFATE HFA 108 (90 BASE) MCG/ACT IN AERS
2.0000 | INHALATION_SPRAY | Freq: Four times a day (QID) | RESPIRATORY_TRACT | 2 refills | Status: DC | PRN
  Filled 2021-01-24: qty 18, 25d supply, fill #0
  Filled 2021-04-26: qty 18, 25d supply, fill #1

## 2021-01-24 MED ORDER — AMIODARONE HCL 200 MG PO TABS
200.0000 mg | ORAL_TABLET | Freq: Two times a day (BID) | ORAL | 0 refills | Status: DC
  Filled 2021-01-24: qty 60, 30d supply, fill #0

## 2021-01-24 MED ORDER — INSULIN GLARGINE 100 UNIT/ML SOLOSTAR PEN
PEN_INJECTOR | SUBCUTANEOUS | 0 refills | Status: DC
  Filled 2021-01-24: qty 9, 32d supply, fill #0

## 2021-01-24 MED ORDER — PANTOPRAZOLE SODIUM 40 MG PO TBEC
40.0000 mg | DELAYED_RELEASE_TABLET | Freq: Two times a day (BID) | ORAL | 0 refills | Status: DC
  Filled 2021-01-24: qty 60, 30d supply, fill #0

## 2021-01-24 MED ORDER — DOCUSATE SODIUM 100 MG PO CAPS
200.0000 mg | ORAL_CAPSULE | Freq: Every day | ORAL | 0 refills | Status: DC
  Filled 2021-01-24: qty 60, 30d supply, fill #0

## 2021-01-24 MED ORDER — QUETIAPINE FUMARATE 50 MG PO TABS
ORAL_TABLET | ORAL | 0 refills | Status: DC
  Filled 2021-01-24: qty 120, 30d supply, fill #0

## 2021-01-24 MED ORDER — BD PEN NEEDLE NANO U/F 32G X 4 MM MISC
1.0000 "application " | Freq: Two times a day (BID) | 0 refills | Status: DC
  Filled 2021-01-24: qty 100, 25d supply, fill #0

## 2021-01-24 MED ORDER — INSULIN GLARGINE-YFGN 100 UNIT/ML ~~LOC~~ SOLN: SUBCUTANEOUS | 0 refills | Status: DC

## 2021-01-24 MED ORDER — CARVEDILOL 6.25 MG PO TABS
6.2500 mg | ORAL_TABLET | Freq: Two times a day (BID) | ORAL | 0 refills | Status: DC
  Filled 2021-01-24: qty 60, 30d supply, fill #0

## 2021-01-24 MED ORDER — DIVALPROEX SODIUM 250 MG PO DR TAB
750.0000 mg | DELAYED_RELEASE_TABLET | Freq: Three times a day (TID) | ORAL | 0 refills | Status: DC
  Filled 2021-01-24: qty 180, 20d supply, fill #0

## 2021-01-24 MED ORDER — LOSARTAN POTASSIUM 25 MG PO TABS
25.0000 mg | ORAL_TABLET | Freq: Every day | ORAL | 0 refills | Status: DC
  Filled 2021-01-24: qty 30, 30d supply, fill #0

## 2021-01-24 NOTE — Progress Notes (Signed)
Inpatient Rehabilitation Care Coordinator Discharge Note   Patient Details  Name: Courtney Davies MRN: [id number] Date of Birth: 12/30/1975   Discharge location: D/c to her mother's home  Length of Stay: 21 days  Discharge activity level: Supervision  Home/community participation: Limited  Patient response WK:MQKMMN Literacy - How often do you need to have someone help you when you read instructions, pamphlets, or other written material from your doctor or pharmacy?: Never  Patient response OT:RRNHAF Isolation - How often do you feel lonely or isolated from those around you?: Never  Services provided included: MD, RD, PT, OT, SLP, RN, CM, Neuropsych, SW, Pharmacy, TR  Financial Services:  Financial Services Utilized: Medicaid  Choices offered to/list presented to: yes  Follow-up services arranged:  Outpatient    Outpatient Servicies: Courtney Davies for outpatient PT/OT/SLP   Patient response to transportation need: Is the patient able to respond to transportation needs?: Yes In the past 12 months, has lack of transportation kept you from medical appointments or from getting medications?: No In the past 12 months, has lack of transportation kept you from meetings, work, or from getting things needed for daily living?: No  Comments (or additional information):  Patient/Family verbalized understanding of follow-up arrangements:  Yes  Individual responsible for coordination of the follow-up plan: contact pt sister Courtney Davies [phone]  Confirmed correct DME delivered: Courtney Davies 01/24/2021    Courtney Davies

## 2021-01-24 NOTE — Progress Notes (Signed)
Discharge Inpatient Rehabilitation Medication Review by a Pharmacist  A complete drug regimen review was completed for this patient to identify any potential clinically significant medication issues.  High Risk Drug Classes Is patient taking? Indication by Medication  Antipsychotic Yes Quetiapine: mood, insomnia   Anticoagulant No   Antibiotic No   Opioid No   Antiplatelet No   Hypoglycemics/insulin Yes Insulin glargine: DM  Vasoactive Medication Yes Carvedilol: BP, arrhythmia Losartan: BP  Chemotherapy No   Other Yes Albuterol: PRN SOB Acetaminophen: pain Amiodarone: arrhythmia  Citalopram: mood Divalproex: agitation Docusate: constipation Multivitamin: nutrition Nicotine: tobacco cessation  Pantoprazole: GERD     Type of Medication Issue Identified Description of Issue Recommendation(s)  Drug Interaction(s) (clinically significant)     Duplicate Therapy     Allergy     No Medication Administration End Date     Incorrect Dose     Additional Drug Therapy Needed     Significant med changes from prior encounter (inform family/care partners about these prior to discharge).    Other       Clinically significant medication issues were identified that warrant physician communication and completion of prescribed/recommended actions by midnight of the next day:  No  Pharmacist comments: n/a  Time spent performing this drug regimen review (minutes):  15 minutes   Thank you for allowing pharmacy to be a part of this patient's care.  Ardyth Harps, PharmD Clinical Pharmacist

## 2021-01-24 NOTE — Progress Notes (Signed)
PROGRESS NOTE   Subjective/Complaints: No new issues. Anxious to get home today! Not sure that her ride is aware that she's ready!  ROS: Patient denies fever, rash, sore throat, blurred vision, nausea, vomiting, diarrhea, cough, shortness of breath or chest pain, joint or back pain, headache, or mood change.    Objective:   No results found. No results for input(s): WBC, HGB, HCT, PLT in the last 72 hours.    Recent Labs    01/23/21 0835  NA 138  K 4.2  CL 107  CO2 23  GLUCOSE 97  BUN 12  CREATININE 0.87  CALCIUM 8.7*       Intake/Output Summary (Last 24 hours) at 01/24/2021 1119 Last data filed at 01/24/2021 0718 Gross per 24 hour  Intake 600 ml  Output --  Net 600 ml        Physical Exam: Vital Signs Blood pressure 138/79, pulse 75, temperature 97.8 F (36.6 C), resp. rate 18, height 5' 6"  (1.676 m), weight 97.3 kg, SpO2 100 %.    Constitutional: No distress . Vital signs reviewed. HEENT: NCAT, EOMI, oral membranes moist Neck: supple Cardiovascular: RRR without murmur. No JVD    Respiratory/Chest: CTA Bilaterally without wheezes or rales. Normal effort    GI/Abdomen: BS +, non-tender, non-distended Ext: no clubbing, cyanosis, or edema Psych: still a bit disinhibited but more engaging and redirectable  Neuro: remains alert, still Can perseverate. Language is more appropriate and on point. Sensory exam normal for light touch and pain in all 4 limbs. No limb ataxia or cerebellar signs. No abnormal tone appreciated.   Motor:   5/5.  Moves all 4's. Good standing and sitting balance Musc: Full ROM, No pain with AROM or PROM in the neck, trunk, or extremities. Posture appropriate   Assessment/Plan: 1. Functional deficits which require 3+ hours per day of interdisciplinary therapy in a comprehensive inpatient rehab setting. Physiatrist is providing close team supervision and 24 hour management of active  medical problems listed below. Physiatrist and rehab team continue to assess barriers to discharge/monitor patient progress toward functional and medical goals  Care Tool:  Bathing    Body parts bathed by patient: Right arm, Left arm, Chest, Abdomen, Buttocks, Left upper leg, Left lower leg, Face, Right lower leg, Right upper leg, Front perineal area   Body parts bathed by helper: Buttocks     Bathing assist Assist Level: Supervision/Verbal cueing     Upper Body Dressing/Undressing Upper body dressing   What is the patient wearing?: Pull over shirt    Upper body assist Assist Level: Supervision/Verbal cueing    Lower Body Dressing/Undressing Lower body dressing      What is the patient wearing?: Pants, Underwear/pull up     Lower body assist Assist for lower body dressing: Supervision/Verbal cueing     Toileting Toileting    Toileting assist Assist for toileting: Supervision/Verbal cueing     Transfers Chair/bed transfer  Transfers assist     Chair/bed transfer assist level: Supervision/Verbal cueing     Locomotion Ambulation   Ambulation assist      Assist level: Supervision/Verbal cueing Assistive device: No Device Max distance: >1000'   Walk  10 feet activity   Assist     Assist level: Supervision/Verbal cueing Assistive device: No Device   Walk 50 feet activity   Assist    Assist level: Supervision/Verbal cueing Assistive device: No Device    Walk 150 feet activity   Assist    Assist level: Supervision/Verbal cueing Assistive device: No Device    Walk 10 feet on uneven surface  activity   Assist Walk 10 feet on uneven surfaces activity did not occur: Safety/medical concerns   Assist level: Supervision/Verbal cueing     Wheelchair     Assist Is the patient using a wheelchair?: No             Wheelchair 50 feet with 2 turns activity    Assist            Wheelchair 150 feet activity      Assist          Blood pressure 138/79, pulse 75, temperature 97.8 F (36.6 C), resp. rate 18, height 5' 6"  (1.676 m), weight 97.3 kg, SpO2 100 %.   Medical Problem List and Plan: 1.  Decline in ADL, mobility , and cognition secondary to anoxic brain injury -Continue CIR therapies including PT, OT, and SLP   -spoke with pt's sister yesterday regarding meds, expectations, recovery, etc    2.   Pain Management: Tylenol prn.  4. Mood:  l   -dc home on seroquel 69m bid and 1067mat bedtime -vpa [id number] -will check level as outpt -celexa 85m86ms -environmental mods, family ed 5. Cellulitis abdomen: Completed course of Keflex --through 9/27. Continue to monitor for healing.  6. T2DM with hyperglycemia:     10/9 - increased am semglee to 18u  10/11 improved control with semglee 18u 10. HTN: Monitor BP tid--continue Cozaar  Controlled  10/11 11. SVT/NSVT: On coreg and amiodarone bid for rate control.   12. Dysphagia:   -continue D3, thins for now 13.  Acute blood loss anemia  Hemoglobin 11.2 on 10/3  14. Constipation:  -moving bowels regularly  -continue current reg   LOS: 22 days A FACE TO FACEFoley11/2022, 11:19 AM

## 2021-01-24 NOTE — Progress Notes (Signed)
Speech Language Pathology Discharge Summary  Patient Details  Name: Courtney Davies MRN: [id number] Date of Birth: April 04, 1976  Patient has met 7 of 7 long term goals.  Patient to discharge at overall Mod level.   Reasons goals not met: N/A   Clinical Impression/Discharge Summary: Patent has made functional gains and has met 7 of 7 LTGs this admission. Currently, patient is consuming Dys. 3 textures with thin liquids with minimal overt s/s of aspiration and supervision-Min A verbal cues for use of swallowing compensatory strategies due to impulsivity. Patient demonstrates improved overall cognitive functioning but continues to require overall Mod A multimodal cues to complete functional and familiar tasks safely in regards to sustained attention, functional problem solving, intellectual awareness, use of external aids to improve recall of functional information, and overall safety awareness. Patient's overall functional communication has also improved with increased ability to express basic wants/needs. However, patient continues to demonstrate moderate deficits in word-finding required Mod A multimodal cues to self-monitor and correct. Patient and family education is complete and patient will discharge home with 24 hour supervision from family. Patient would benefit from f/u SLP services to maximize her cognitive and swallowing function as well as her functional communication in order to reduce caregiver burden.   Care Partner:  Caregiver Able to Provide Assistance: Yes  Type of Caregiver Assistance: Physical;Cognitive  Recommendation:  Outpatient SLP;24 hour supervision/assistance  Rationale for SLP Follow Up: Reduce caregiver burden;Maximize functional communication;Maximize cognitive function and independence;Maximize swallowing safety   Equipment: N/A   Reasons for discharge: Discharged from hospital   Patient/Family Agrees with Progress Made and Goals Achieved: Yes    Latonya Nelon,  [place] 01/24/2021, 6:18 AM

## 2021-01-25 ENCOUNTER — Encounter: Payer: Self-pay | Admitting: Interventional Cardiology

## 2021-01-25 ENCOUNTER — Other Ambulatory Visit: Payer: Self-pay

## 2021-01-25 ENCOUNTER — Ambulatory Visit (INDEPENDENT_AMBULATORY_CARE_PROVIDER_SITE_OTHER): Payer: Medicaid Other | Admitting: Interventional Cardiology

## 2021-01-25 VITALS — BP 120/76 | HR 71 | Ht 66.0 in | Wt 214.6 lb

## 2021-01-25 DIAGNOSIS — I469 Cardiac arrest, cause unspecified: Secondary | ICD-10-CM

## 2021-01-25 DIAGNOSIS — I4901 Ventricular fibrillation: Secondary | ICD-10-CM

## 2021-01-25 DIAGNOSIS — I480 Paroxysmal atrial fibrillation: Secondary | ICD-10-CM | POA: Diagnosis not present

## 2021-01-25 DIAGNOSIS — I5022 Chronic systolic (congestive) heart failure: Secondary | ICD-10-CM

## 2021-01-25 MED ORDER — AMIODARONE HCL 200 MG PO TABS
200.0000 mg | ORAL_TABLET | Freq: Every day | ORAL | 3 refills | Status: DC
Start: 2021-01-25 — End: 2021-12-20

## 2021-01-25 NOTE — Discharge Summary (Signed)
Physician Discharge Summary  Patient ID: AAYLIAH ROTENBERRY MRN: [id number] DOB/AGE: 12/07/1975 45 y.o.  Admit date: 01/02/2021 Discharge date: 01/24/2021  Discharge Diagnoses:  Principal Problem:   Anoxic brain injury [facility]) Active Problems:   Acute blood loss anemia   Controlled type 2 diabetes mellitus with hyperglycemia ([place])   Essential hypertension   Discharged Condition: good  Significant Diagnostic Studies: No results found.  Labs:  Basic Metabolic Panel: BMP Latest Ref Rng & Units 01/23/2021 01/16/2021 01/09/2021  Glucose 70 - 99 mg/dL 97 111(H) 166(H)  BUN 6 - 20 mg/dL 12 15 9   Creatinine 0.44 - 1.00 mg/dL 0.87 0.83 0.82  BUN/Creat Ratio 9 - 23 - - -  Sodium 135 - 145 mmol/L 138 139 136  Potassium 3.5 - 5.1 mmol/L 4.2 4.1 4.1  Chloride 98 - 111 mmol/L 107 107 104  CO2 22 - 32 mmol/L 23 25 25   Calcium 8.9 - 10.3 mg/dL 8.7(L) 8.7(L) 8.9     CBC: CBC Latest Ref Rng & Units 01/16/2021 01/09/2021 01/03/2021  WBC 4.0 - 10.5 K/uL 5.9 5.8 6.0  Hemoglobin 12.0 - 15.0 g/dL 11.2(L) 9.8(L) 9.9(L)  Hematocrit 36.0 - 46.0 % 36.6 31.8(L) 32.0(L)  Platelets 150 - 400 K/uL 303 258 287     CBG: Recent Labs  Lab 01/23/21 1200 01/23/21 1729 01/23/21 1846 01/23/21 2106 01/24/21 0600  GLUCAP 103* 58* 147* 170* 74    Brief HPI:   DYNESHA WOOLEN is a 45 y.o. female with history of T2DM, Vitamin B12 deficiency, chronic low back pain, COVID positive status 3 weeks prior to admission 12/01/2020 with VF cardiac arrest, fall about 4 feet and ROSC after 10 minutes of ACLS.  She was intubated for airway protection was not noted to have elevated troponin and CT of chest done which was negative for PE but showed BLE atelectasis.  She was treated with IV antibiotics due to concerns of aspiration pneumonia and started on IV heparin.  2D echo showed EF 40 to 45% with grade 1 diastolic dysfunction and mild to moderate MVR.  She was maintained on IV amiodarone due to episodes of PSVT without VT and  possible PAF.  Dr. Marella Bile nausea with cardiology recommended cardiac cath for work-up and neurological recovery.  Hospital course was significant for issues with minimal responsiveness due to moderate diffuse encephalopathy as well as fevers and difficulty handling secretions.  She was maintained n.p.o. due to cognitive-based dysphagia as well as resistance to trials of oral and PEG placed on 08/30 for nutritional support.  She also developed leukocytosis with CT abdomen/pelvis showing stranding/edema of subcu abdominal wall which was negative for abscess and she was t started on antibiotics with recommendations to complete 2 total weeks of therapy.  Reated with 14-day course of antibiotics for cellulitis.  She started developing follow-up with drainage around PEG site and patient ended up pulling out her PEG with local wound care ongoing.  Lethargy had resolved and psychiatry was consulted to help manage aggressive behavior as well as mood stabilization.  She was started on Depakote as well as Haldol/Ativan as needed to manage agitation.  She continues to be limited by cognitive deficits, balance deficits, poor safety awareness with internal distractibility as well as difficulty following commands.  CIR was recommended due to functional decline.   Hospital Course: MALAIKA ARNALL was admitted to rehab 01/02/2021 for inpatient therapies to consist of PT, ST and OT at least three hours five days a week. Past admission physiatrist, therapy team  and rehab RN have worked together to provide customized collaborative inpatient rehab.  Cellulitis of abdomen has resolved and she completed 14-day course of Keflex on 09/27.  Her blood pressures were monitored on 3 times daily basis and have been controlled on Cozaar.  Heart rate has been stable and no cardiac symptoms noted/reported on current dose of Coreg and amiodarone. Nutritional supplements were added to help with p.o. intake.  Diabetes has been monitored with ac/hs  CBG checks and SSI was use prn for tighter BS control.   She continued to have significant bouts of agitation and past admission and Depakote was titrated up to 750 mg 3 times daily as VPA level was in the low therapeutic at 51.  Seroquel was added and titrated upwards. Celexa was also added to help with mood and anxiety.  Team has been working with patient to provide ego support, encouragement and redirection.  Agitation has resolved and mood is currently stable on current regimen.  Follow-up CBC shows acute blood loss anemia is resolving.  Serial BMET shows electrolytes and renal status to be WNL.  She has made steady gains during her stay and supervision is recommended for safety and due to cognitive deficits.  Family educated on importance of maintaining a schedule as well as providing assistance with cognitive tasks.  She will continue to receive outpatient PT, OT and ST at [facility] after discharge.    Rehab course: During patient's stay in rehab weekly team conferences were held to monitor patient's progress, set goals and discuss barriers to discharge. At admission, patient required mod assist with mobility and with basic self care tasks.  She exhibited significant cognitive deficits with SLUMS score 6/30 with intermittent language of confusion and intermittent awareness of confused state. She  has had improvement in activity tolerance, balance, postural control as well as ability to compensate for deficits.  She is able to complete ADL tasks with supervision and min cues for safety awareness and to look to be ADL items.  She requires supervision for transfers and to ambulate at 1000 feet without assistive device but verbal cues for safety.  She is tolerating dysphagia 3 diet with minimal overt signs or symptoms of aspiration requires supervision to min verbal cues to utilize swallow strategies.  She continues to require moderate assist with multiple moderate cues to complete functional and  familiar tasks safely.  Family education has been completed    Discharge disposition: 01-Home or Self Care  Diet: Diabetic diet  Special Instructions: No driving or strenuous activity till cleared by MD.  Discharge Instructions     Ambulatory referral to Physical Medicine Rehab   Complete by: As directed    Hospital follow up with [facility] or [facility]      Allergies as of 01/24/2021       Reactions   Other Other (See Comments), Anaphylaxis, Swelling, Hives   Hair glue causes throat to close and hives "glue" Hair glue causes throat to close   Other Anaphylaxis, Hives   Hair Glue   Latex Hives   Codeine Nausea And Vomiting   Codeine Nausea And Vomiting   Latex Hives   Nitrofurantoin Rash        Medication List     STOP taking these medications    amiodarone 200 MG tablet Commonly known as: PACERONE   Breo Ellipta 200-25 MCG/INH Aepb Generic drug: fluticasone furoate-vilanterol   cephALEXin 500 MG capsule Commonly known as: KEFLEX   cyclobenzaprine 5 MG tablet Commonly  known as: FLEXERIL   diazepam 5 MG tablet Commonly known as: VALIUM   divalproex 125 MG capsule Commonly known as: DEPAKOTE SPRINKLE Replaced by: divalproex 250 MG DR tablet   enoxaparin 40 MG/0.4ML injection Commonly known as: LOVENOX   famotidine 20 MG tablet Commonly known as: PEPCID   folic acid 1 MG tablet Commonly known as: FOLVITE   gabapentin 300 MG capsule Commonly known as: NEURONTIN   haloperidol lactate 5 MG/ML injection Commonly known as: HALDOL   HYDROcodone-acetaminophen 5-325 MG tablet Commonly known as: NORCO/VICODIN   ibuprofen 600 MG tablet Commonly known as: ADVIL   insulin aspart 100 UNIT/ML injection Commonly known as: novoLOG   insulin glargine-yfgn 100 UNIT/ML injection Commonly known as: SEMGLEE   Linzess 290 MCG Caps capsule Generic drug: linaclotide   LORazepam 2 MG/ML injection Commonly known as: ATIVAN   methocarbamol 500 MG  tablet Commonly known as: ROBAXIN   nabumetone 500 MG tablet Commonly known as: RELAFEN   risperiDONE 1 MG disintegrating tablet Commonly known as: RISPERDAL M-TABS   Toviaz 4 MG Tb24 tablet Generic drug: fesoterodine   varenicline 0.5 MG X 11 & 1 MG X 42 tablet Commonly known as: Chantix Starting Month Pak   varenicline 1 MG tablet Commonly known as: Chantix Continuing Month Pak   VITAMIN B-12 IJ   vitamin C 500 MG tablet Commonly known as: ASCORBIC ACID   Vitamin D (Ergocalciferol) 1.25 MG (50000 UNIT) Caps capsule Commonly known as: DRISDOL       TAKE these medications    acetaminophen 325 MG tablet Commonly known as: TYLENOL Take 2 tablets (650 mg total) by mouth every 6 (six) hours. Notes to patient: For pain--can decrease to once a day and wean off as pain improves   BD Pen Needle Nano U/F 32G X 4 MM Misc Generic drug: Insulin Pen Needle Use as directed twice daily.   carvedilol 6.25 MG tablet Commonly known as: COREG Take 1 tablet (6.25 mg total) by mouth 2 (two) times daily with a meal.   citalopram 20 MG tablet Commonly known as: CELEXA Take 1 tablet (20 mg total) by mouth at bedtime.   divalproex 250 MG DR tablet Commonly known as: Depakote Take 3 tablets (750 mg total) by mouth 3 (three) times daily. Replaces: divalproex 125 MG capsule   docusate sodium 100 MG capsule Commonly known as: COLACE Take 2 capsules (200 mg total) by mouth daily.   feeding supplement Liqd Take 237 mLs by mouth 3 (three) times daily between meals.   Lantus SoloStar 100 UNIT/ML Solostar Pen Generic drug: insulin glargine Inject 18 Units into the skin daily AND 10 Units at bedtime.   losartan 25 MG tablet Commonly known as: COZAAR Take 1 tablet (25 mg total) by mouth daily.   multivitamin with minerals Tabs tablet Take 1 tablet by mouth daily.   nicotine 21 mg/24hr patch Commonly known as: NICODERM CQ - dosed in mg/24 hours Place 1 patch (21 mg total) onto the  skin daily.   pantoprazole 40 MG tablet Commonly known as: PROTONIX Take 1 tablet (40 mg total) by mouth 2 (two) times daily. What changed:  when to take this Another medication with the same name was removed. Continue taking this medication, and follow the directions you see here.   QUEtiapine 50 MG tablet Commonly known as: SEROQUEL Take 1 tablet (50 mg total) by mouth 2 (two) times daily AND 2 tablets (100 mg total) at bedtime.   Ventolin HFA 108 (90 Base)  MCG/ACT inhaler Generic drug: albuterol Inhale 2 puffs into the lungs every 6 (six) hours as needed for wheezing or shortness of breath. What changed: Another medication with the same name was removed. Continue taking this medication, and follow the directions you see here.        Follow-up Information     Meredith Staggers, MD Follow up.   Specialty: Physical Medicine and Rehabilitation Why: office will call you with follow up appointment Contact information: [address] [place] [address] [phone]         Jettie Booze, MD Follow up on 01/25/2021.   Specialties: Cardiology, Radiology, Interventional Cardiology Why: Be there at 1:45pm for 2 pm appointment. Contact information: [address] [address] [place], [place]. Call.   Why: for post hospital follow up and follow up on thyroid enlargement. Contact information: [place] [address] [phone]                 Signed: Bary Leriche 01/25/2021, 10:04 PM

## 2021-01-25 NOTE — Patient Instructions (Addendum)
Medication Instructions:  Your physician has recommended you make the following change in your medication: Decrease amiodarone to 200 mg by mouth daily  *If you need a refill on your cardiac medications before your next appointment, please call your pharmacy*   Lab Work: none If you have labs (blood work) drawn today and your tests are completely normal, you will receive your results only by: [place] (if you have MyChart) OR A paper copy in the mail If you have any lab test that is abnormal or we need to change your treatment, we will call you to review the results.   Testing/Procedures: Your physician has requested that you have an echocardiogram. Echocardiography is a painless test that uses sound waves to create images of your heart. It provides your doctor with information about the size and shape of your heart and how well your heart's chambers and valves are working. This procedure takes approximately one hour. There are no restrictions for this procedure.  Your physician has requested that you have cardiac CT. Cardiac computed tomography (CT) is a painless test that uses an x-ray machine to take clear, detailed pictures of your heart. For further information please visit [email or web address]. Please follow instruction sheet as given.     Follow-Up: At [facility], you and your health needs are our priority.  As part of our continuing mission to provide you with exceptional heart care, we have created designated Provider Care Teams.  These Care Teams include your primary Cardiologist (physician) and Advanced Practice Providers (APPs -  Physician Assistants and Nurse Practitioners) who all work together to provide you with the care you need, when you need it.  We recommend signing up for the patient portal called "MyChart".  Sign up information is provided on this After Visit Summary.  MyChart is used to connect with patients for Virtual Visits (Telemedicine).  Patients are  able to view lab/test results, encounter notes, upcoming appointments, etc.  Non-urgent messages can be sent to your provider as well.   To learn more about what you can do with MyChart, go to [email or web address].    Your next appointment:   Based on test results  The format for your next appointment:   In Person  Provider:   You may see Larae Grooms, MD or one of the following Advanced Practice Providers on your designated Care Team:   Melina Copa, PA-C Ermalinda Barrios, PA-C   Other Instructions    Your cardiac CT will be scheduled at one of the below locations:   [facility] [address] [place], [place] [address] [phone]  [place] [address] [place], [place] [address] [phone]  If scheduled at [facility], please arrive at the [facility] main entrance (entrance A) of [facility] 30 minutes prior to test start time. Proceed to the [facility] Radiology Department (first floor) to check-in and test prep.  If scheduled at [facility], please arrive 15 mins early for check-in and test prep.  Please follow these instructions carefully (unless otherwise directed):    On the Night Before the Test: Be sure to Drink plenty of water. Do not consume any caffeinated/decaffeinated beverages or chocolate 12 hours prior to your test. Do not take any antihistamines 12 hours prior to your test.   On the Day of the Test: Drink plenty of water until 1 hour prior to the test. Do not eat any food 4 hours  prior to the test. You may take your regular medications prior to the test.  Take 2 Carvedilol tablets 2 hours prior to the CT Scan HOLD Furosemide/Hydrochlorothiazide morning of the test. FEMALES- please wear underwire-free bra if available, avoid dresses & tight clothing        After the Test: Drink plenty of water. After receiving IV contrast,  you may experience a mild flushed feeling. This is normal. On occasion, you may experience a mild rash up to 24 hours after the test. This is not dangerous. If this occurs, you can take Benadryl 25 mg and increase your fluid intake. If you experience trouble breathing, this can be serious. If it is severe call 911 IMMEDIATELY. If it is mild, please call our office. If you take any of these medications: Glipizide/Metformin, Avandament, Glucavance, please do not take 48 hours after completing test unless otherwise instructed.  Please allow 2-4 weeks for scheduling of routine cardiac CTs. Some insurance companies require a pre-authorization which may delay scheduling of this test.   For non-scheduling related questions, please contact the cardiac imaging nurse navigator should you have any questions/concerns: Marchia Bond, Cardiac Imaging Nurse Navigator Gordy Clement, Cardiac Imaging Nurse Navigator [facility] Direct Office Dial: [phone]   For scheduling needs, including cancellations and rescheduling, please call [place], [phone].

## 2021-01-25 NOTE — Progress Notes (Signed)
Cardiology Office Note   Date:  01/25/2021   ID:  Courtney Davies, DOB 1975-12-23, MRN [id number]  PCP:  [facility]    No chief complaint on file.  Cardiac arrest  Wt Readings from Last 3 Encounters:  01/25/21 214 lb 9.6 oz (97.3 kg)  01/23/21 214 lb 8.1 oz (97.3 kg)  12/31/20 207 lb 7.3 oz (94.1 kg)       History of Present Illness: Courtney Davies is a 45 y.o. female  who had cardiac arrest in 11/2020.  Prior records show: "There is lots of uncertainty about the patient's cardiac arrest.  She did have ventricular fibrillation during the arrest and required multiple defibrillations.  There is some documentation that she may have had a respiratory event prior to this.  She did test positive for COVID.  She has had a few episodes of nonsustained VT of 7-8 beats over the last 24 hours.  She has also had some short supraventricular runs.  Recommend continuation of carvedilol and amiodarone.  Further cardiac testing is all on hold because of her significant neurologic dysfunction.  At this point she is not a candidate for any invasive cardiac procedures.  We will reassess her intermittently but it does not appear that she is going to have a quick recovery."  After being discharged from the hospital, she was sent to rehab.  She has made a very good recovery.  She returned home yesterday.  She has continued to walk regularly.  She denies chest discomfort.  She has not had shortness of breath fluid overload in the last week or so.  She never had a cardiac cath due to questions about her neurologic recovery.  She does report her memory is not what it once was before.  She does not remember anything about her hospital stay.  Overall, she does feel like she is improving.    Past Medical History:  Diagnosis Date   Anemia 09/2015   Anemia    Arthritis    "knees" (04/23/2017)   Asthma    "teens; went away; came back" (04/23/2017)   B12 deficiency anemia 04/23/2017   Chronic  bronchitis (HCC)    Chronic low back pain with sciatica    Chronic lower back pain    Diabetes mellitus without complication ([place])    Diabetes type 2, controlled ([place])    Elevated ferritin level    Fatty liver    GERD (gastroesophageal reflux disease)    GERD with stricture    Headache    "1-2/wk" (04/23/2017)   History of blood transfusion "plenty"   "related to anemia" (04/23/2017)   Hypertension    Inguinal hernia    Low back pain    Migraine    "1-2/month" (04/23/2017)   OA (osteoarthritis) of knee--left    Symptomatic anemia 04/23/2017   Vitamin B 12 deficiency     Past Surgical History:  Procedure Laterality Date   ABDOMINAL HYSTERECTOMY  YRS AGO   COMPLETE   CESAREAN SECTION  1998; 2002   ESOPHAGEAL DILATION  02/2020   by Dr Shana Chute   INGUINAL HERNIA REPAIR Bilateral 1980s    "total of 4 surgeries" (04/23/2017)   IR GASTROSTOMY TUBE MOD SED  12/13/2020   KNEE ARTHROSCOPY WITH MEDIAL MENISECTOMY Left 01/30/2018   Procedure: LEFT KNEE ARTHROSCOPY WITH PARTIAL LATERAL MENISCECTOMY;  Surgeon: Mcarthur Rossetti, MD;  Location: [facility];  Service: Orthopedics;  Laterality: Left;   KNEE CARTILAGE SURGERY Left  TUBAL LIGATION  2002     Current Outpatient Medications  Medication Sig Dispense Refill   acetaminophen (TYLENOL) 325 MG tablet Take 2 tablets (650 mg total) by mouth every 6 (six) hours. 200 tablet 0   albuterol (VENTOLIN HFA) 108 (90 Base) MCG/ACT inhaler Inhale 2 puffs into the lungs every 6 (six) hours as needed for wheezing or shortness of breath. 18 g 2   carvedilol (COREG) 6.25 MG tablet Take 1 tablet (6.25 mg total) by mouth 2 (two) times daily with a meal. 60 tablet 0   citalopram (CELEXA) 20 MG tablet Take 1 tablet (20 mg total) by mouth at bedtime. 30 tablet 0   divalproex (DEPAKOTE) 250 MG DR tablet Take 3 tablets (750 mg total) by mouth 3 (three) times daily. 180 tablet 0   docusate sodium (COLACE) 100 MG capsule Take 2 capsules (200 mg total) by mouth  daily. 60 capsule 0   feeding supplement (ENSURE ENLIVE / ENSURE PLUS) LIQD Take 237 mLs by mouth 3 (three) times daily between meals. 237 mL 12   insulin glargine (LANTUS) 100 UNIT/ML Solostar Pen Inject 18 Units into the skin daily AND 10 Units at bedtime. 15 mL 0   Insulin Pen Needle (BD PEN NEEDLE NANO U/F) 32G X 4 MM MISC Use as directed twice daily. 100 each 0   losartan (COZAAR) 25 MG tablet Take 1 tablet (25 mg total) by mouth daily. 30 tablet 0   Multiple Vitamin (MULTIVITAMIN WITH MINERALS) TABS tablet Take 1 tablet by mouth daily.     nicotine (NICODERM CQ - DOSED IN MG/24 HOURS) 21 mg/24hr patch Place 1 patch (21 mg total) onto the skin daily. 28 patch 0   pantoprazole (PROTONIX) 40 MG tablet Take 1 tablet (40 mg total) by mouth 2 (two) times daily. 60 tablet 0   QUEtiapine (SEROQUEL) 50 MG tablet Take 1 tablet (50 mg total) by mouth 2 (two) times daily AND 2 tablets (100 mg total) at bedtime. 120 tablet 0   No current facility-administered medications for this visit.    Allergies:   Other, Other, Latex, Codeine, Codeine, Latex, and Nitrofurantoin    Social History:  The patient  reports that she has an unknown smoking status. She has never used smokeless tobacco. She reports that she does not currently use alcohol. She reports that she does not use drugs.   Family History:  The patient's family history includes Anemia in her paternal grandmother; Diabetes in her father, maternal grandmother, and mother; Hypertension in her mother; Prostate cancer in her maternal uncle; Stroke in her maternal grandmother and mother; Valvular heart disease in her paternal grandmother.    ROS:  Please see the history of present illness.   Otherwise, review of systems are positive for memory is not as good as it used to be.   All other systems are reviewed and negative.    PHYSICAL EXAM: VS:  BP 120/76   Pulse 71   Ht 5' 6"  (1.676 m)   Wt 214 lb 9.6 oz (97.3 kg)   SpO2 98%   BMI 34.64 kg/m  ,  BMI Body mass index is 34.64 kg/m. GEN: Well nourished, well developed, in no acute distress HEENT: normal Neck: no JVD, carotid bruits, or masses Cardiac: RRR; no murmurs, rubs, or gallops,no edema  Respiratory:  clear to auscultation bilaterally, normal work of breathing GI: soft, nontender, nondistended, + BS MS: no deformity or atrophy Skin: warm and dry, no rash Neuro:  Strength and sensation  are intact Psych: euthymic mood, full affect   EKG:   The ekg ordered 8/19/2022demonstrates sinus tachycardia with PACs   Recent Labs: 12/01/2020: B Natriuretic Peptide 37.2 12/05/2020: TSH 2.467 12/14/2020: Magnesium 2.0 01/16/2021: Hemoglobin 11.2; Platelets 303 01/19/2021: ALT 18 01/23/2021: BUN 12; Creatinine, Ser 0.87; Potassium 4.2; Sodium 138   Lipid Panel    Component Value Date/Time   CHOL 163 12/02/2020 0834   TRIG 151 (H) 12/07/2020 0510   HDL 33 (L) 12/02/2020 0834   CHOLHDL 4.9 12/02/2020 0834   VLDL 40 12/02/2020 0834   LDLCALC 90 12/02/2020 0834     Other studies Reviewed: Additional studies/ records that were reviewed today with results demonstrating: Hospital records reviewed.   ASSESSMENT AND PLAN:  Ventricular fibrillation/cardiac arrest: She had several defibrillations during her code.  She needs to have coronary artery disease excluded.  Given her lack of symptoms at this point, I would prefer not to put her through an invasive cardiac cath.  We will plan for CTA coronaries to evaluate for obstructive coronary artery disease.  She will take an extra dose of carvedilol prior to the CT to help with her heart rate. Ejection fraction by echo was decreased.  She has been on medications for chronic systolic heart failure.  No objective signs of heart failure on exam.  She appears euvolemic.  We will recheck echocardiogram. PAF: Appears to be in normal sinus rhythm at this time.  Decrease amiodarone to 200 mg daily. If her ischemic work-up is negative, will need to  consider EP referral and potentially cardiac MRI to further work-up her source of arrhythmia.   Current medicines are reviewed at length with the patient today.  The patient concerns regarding her medicines were addressed.  The following changes have been made:  No change  Labs/ tests ordered today include:  No orders of the defined types were placed in this encounter.   Recommend 150 minutes/week of aerobic exercise Low fat, low carb, high fiber diet recommended  Disposition:   FU in based on test results   Signed, Larae Grooms, MD  01/25/2021 3:18 PM    [facility] [place], [place], [place]  [address] Phone: [phone]; Fax: [phone]

## 2021-02-01 ENCOUNTER — Telehealth (HOSPITAL_COMMUNITY): Payer: Self-pay | Admitting: Emergency Medicine

## 2021-02-01 ENCOUNTER — Telehealth: Payer: Self-pay

## 2021-02-01 NOTE — Telephone Encounter (Signed)
Attempted to contact patient's sister [name] to schedule a Palliative Care consult appointment. No answer left a message to return call.

## 2021-02-01 NOTE — Telephone Encounter (Signed)
Attempted to call patient regarding upcoming cardiac CT appointment. °Left message on voicemail with name and callback number °Madelena Maturin RN Navigator Cardiac Imaging °[facility] °[phone] Office °[phone] Cell ° °

## 2021-02-02 ENCOUNTER — Other Ambulatory Visit: Payer: Self-pay

## 2021-02-02 ENCOUNTER — Ambulatory Visit (HOSPITAL_COMMUNITY)
Admission: RE | Admit: 2021-02-02 | Discharge: 2021-02-02 | Disposition: A | Discharge status: Home / Self Care | Payer: Medicaid Other | Source: Ambulatory Visit | Attending: Interventional Cardiology | Admitting: Interventional Cardiology

## 2021-02-02 DIAGNOSIS — I469 Cardiac arrest, cause unspecified: Secondary | ICD-10-CM

## 2021-02-02 DIAGNOSIS — I4901 Ventricular fibrillation: Secondary | ICD-10-CM | POA: Diagnosis not present

## 2021-02-02 MED ORDER — NITROGLYCERIN 0.4 MG SL SUBL
0.8000 mg | SUBLINGUAL_TABLET | Freq: Once | SUBLINGUAL | Status: AC
  Administered 2021-02-02: 0.8 mg via SUBLINGUAL

## 2021-02-02 MED ORDER — IOHEXOL 350 MG/ML SOLN
95.0000 mL | Freq: Once | INTRAVENOUS | Status: AC | PRN
  Administered 2021-02-02: 95 mL via INTRAVENOUS

## 2021-02-02 MED ORDER — NITROGLYCERIN 0.4 MG SL SUBL
SUBLINGUAL_TABLET | SUBLINGUAL | Status: AC
  Filled 2021-02-02: qty 2

## 2021-02-03 ENCOUNTER — Telehealth: Payer: Self-pay

## 2021-02-03 DIAGNOSIS — I469 Cardiac arrest, cause unspecified: Secondary | ICD-10-CM

## 2021-02-03 NOTE — Telephone Encounter (Signed)
-----   Message from Jettie Booze, MD sent at 02/03/2021 10:56 AM EDT ----- No CAD on coronary CT.  Refer to EP for consideration of AICD/cardiac MRI.    Will.  THis patient had a VF arrest in the setting of COVID positive, ? Resp issues.  SHe has recovered well.  CTA coronaries negative for CAD. WOuld you want a cardiac MRI before seeing her? THanks.

## 2021-02-03 NOTE — Telephone Encounter (Signed)
Spoke with pt and advised per Dr Irish Lack cardiac CT shows no evidence of CAD.  Pt will be referred to EP to discuss AICD/cMRI.  Pt verbalizes understanding and agrees with current plan.

## 2021-02-07 ENCOUNTER — Telehealth: Payer: Self-pay | Admitting: Nurse Practitioner

## 2021-02-07 ENCOUNTER — Telehealth: Payer: Self-pay

## 2021-02-07 DIAGNOSIS — I4901 Ventricular fibrillation: Secondary | ICD-10-CM

## 2021-02-07 DIAGNOSIS — I5022 Chronic systolic (congestive) heart failure: Secondary | ICD-10-CM

## 2021-02-07 DIAGNOSIS — I469 Cardiac arrest, cause unspecified: Secondary | ICD-10-CM

## 2021-02-07 NOTE — Telephone Encounter (Signed)
Order placed for cardiac mri and message to scheduler and precert.

## 2021-02-07 NOTE — Telephone Encounter (Signed)
-----   Message from Jettie Booze, MD sent at 02/06/2021  4:57 PM EDT ----- OK to set up cardiac MRI to look for cause of cardiac arrest. THen she can see Dr. Curt Bears after that time.

## 2021-02-07 NOTE — Telephone Encounter (Signed)
Spoke with patient's mother Courtney Davies she requested I contact patient's sister Courtney Davies. Spoke with sister and scheduled an in-person Palliative Consult for 02/23/21 @ 8:30 AM with Dr. Hollace Kinnier. Documentation will be noted in [facility].   COVID screening was negative. One dog in the home will put away. Patient is staying with her mother Courtney Davies at [address] [employer].   Consent obtained; updated Outlook/Netsmart/Team List and Epic.   Family is aware they may be receiving a call from provider the day before or day of to confirm appointment.

## 2021-02-13 ENCOUNTER — Other Ambulatory Visit: Payer: Self-pay

## 2021-02-13 ENCOUNTER — Ambulatory Visit (HOSPITAL_COMMUNITY): Payer: Medicaid Other | Attending: Internal Medicine

## 2021-02-13 DIAGNOSIS — I4901 Ventricular fibrillation: Secondary | ICD-10-CM | POA: Diagnosis not present

## 2021-02-13 DIAGNOSIS — I469 Cardiac arrest, cause unspecified: Secondary | ICD-10-CM | POA: Diagnosis not present

## 2021-02-13 LAB — ECHOCARDIOGRAM COMPLETE
Area-P 1/2: 3.42 cm2
Radius: 0.5 cm
S' Lateral: 3.2 cm

## 2021-03-08 ENCOUNTER — Encounter: Payer: Self-pay | Admitting: Physical Medicine & Rehabilitation

## 2021-03-08 ENCOUNTER — Encounter: Payer: Medicaid Other | Attending: Physical Medicine & Rehabilitation | Admitting: Physical Medicine & Rehabilitation

## 2021-03-08 ENCOUNTER — Other Ambulatory Visit: Payer: Self-pay

## 2021-03-08 VITALS — BP 144/83 | HR 80 | Ht 66.0 in | Wt 224.6 lb

## 2021-03-08 DIAGNOSIS — G8929 Other chronic pain: Secondary | ICD-10-CM | POA: Insufficient documentation

## 2021-03-08 DIAGNOSIS — G931 Anoxic brain damage, not elsewhere classified: Secondary | ICD-10-CM | POA: Insufficient documentation

## 2021-03-08 DIAGNOSIS — M25562 Pain in left knee: Secondary | ICD-10-CM | POA: Diagnosis present

## 2021-03-08 MED ORDER — DICLOFENAC SODIUM 1 % EX GEL: 1.0000 "application " | Freq: Three times a day (TID) | CUTANEOUS | 4 refills | Status: DC

## 2021-03-08 MED ORDER — DIVALPROEX SODIUM 250 MG PO DR TAB: 500.0000 mg | DELAYED_RELEASE_TABLET | Freq: Three times a day (TID) | ORAL | 0 refills | Status: DC

## 2021-03-08 NOTE — Patient Instructions (Addendum)
RETURN TO DRIVING PLAN:  WITH THE SUPERVISION OF A LICENSED DRIVER, PLEASE DRIVE IN AN EMPTY PARKING LOT FOR AT LEAST 2-3 TRIALS TO TEST REACTION TIME, VISION, USE OF EQUIPMENT IN CAR, ETC.  IF SUCCESSFUL WITH THE PARKING LOT DRIVING, PROCEED TO SUPERVISED DRIVING TRIALS IN YOUR NEIGHBORHOOD STREETS AT LOW TRAFFIC TIMES TO TEST OBSERVATION TO TRAFFIC SIGNALS, REACTION TIME, ETC. PLEASE ATTEMPT AT LEAST 2-3 TRIALS IN YOUR NEIGHBORHOOD.  IF NEIGHBORHOOD DRIVING IS SUCCESSFUL, YOU MAY PROCEED TO DRIVING IN BUSIER AREAS IN YOUR COMMUNITY WITH SUPERVISION OF A LICENSED DRIVER.       *REDUCE YOUR DEPAKOTE TO 500MG THREE TIMES DAILY

## 2021-03-08 NOTE — Progress Notes (Signed)
Subjective:    Patient ID: Courtney Davies, female    DOB: 1975/10/03, 45 y.o.   MRN: [id number]  HPI  Courtney Davies is here in follow up of her ABI. She is improving in most areas. She still struggles with short term memory, especially with day to day information. She uses a pill organizer and her phone to keep meds and appointments straight. She is currently living with her mom. Her mom has left to run errands now and then. They have a CNA at home as well.    She is having some left knee pain which is chronic. The knee often bothers her at night. She has not used tylenol or ice to this point  She is anxious to get back to work eventually but knows that she still needs to heal.   Her sister was on phone listening to visit today.   Pain Inventory Average Pain 10 Pain Right Now 10 My pain is sharp and burning  LOCATION OF PAIN  knee  BOWEL Number of stools per week: 7   BLADDER Normal    Mobility walk without assistance ability to climb steps?  yes do you drive?  no  Function not employed: date last employed 11/2020  Neuro/Psych numbness  Prior Studies Any changes since last visit?  no  Physicians involved in your care Any changes since last visit?  no   Family History  Problem Relation Age of Onset   Stroke Mother    Diabetes Mother    Diabetes Father    Stroke Maternal Grandmother    Diabetes Maternal Grandmother    Anemia Paternal Grandmother    Valvular heart disease Paternal Grandmother    Hypertension Mother    Prostate cancer Maternal Uncle        ? intestinal also   Social History   Socioeconomic History   Marital status: Single    Spouse name: Not on file   Number of children: 2   Years of education: 11th   Highest education level: Not on file  Occupational History   Occupation: salad maker in restaurant  Tobacco Use   Smoking status: Unknown   Smokeless tobacco: Never  Vaping Use   Vaping Use: Unknown  Substance and Sexual Activity    Alcohol use: Not Currently    Comment: occ   Drug use: Never    Types: Marijuana   Sexual activity: Not Currently    Birth control/protection: Surgical  Other Topics Concern   Not on file  Social History Narrative   ** Merged History Encounter **       Lives at home with her two children. Occasional caffeine use. Right-handed.   Social Determinants of Health   Financial Resource Strain: Not on file  Food Insecurity: Not on file  Transportation Needs: Not on file  Physical Activity: Not on file  Stress: Not on file  Social Connections: Not on file   Past Surgical History:  Procedure Laterality Date   ABDOMINAL HYSTERECTOMY  YRS [place]; 2002   ESOPHAGEAL DILATION  02/2020   by Dr Shana Chute   INGUINAL HERNIA REPAIR Bilateral 1980s    "total of 4 surgeries" (04/23/2017)   IR GASTROSTOMY TUBE MOD SED  12/13/2020   KNEE ARTHROSCOPY WITH MEDIAL MENISECTOMY Left 01/30/2018   Procedure: LEFT KNEE ARTHROSCOPY WITH PARTIAL LATERAL MENISCECTOMY;  Surgeon: Mcarthur Rossetti, MD;  Location: [facility];  Service: Orthopedics;  Laterality: Left;  KNEE CARTILAGE SURGERY Left    TUBAL LIGATION  2002   Past Medical History:  Diagnosis Date   Anemia 09/2015   Anemia    Arthritis    "knees" (04/23/2017)   Asthma    "teens; went away; came back" (04/23/2017)   B12 deficiency anemia 04/23/2017   Chronic bronchitis (HCC)    Chronic low back pain with sciatica    Chronic lower back pain    Diabetes mellitus without complication ([place])    Diabetes type 2, controlled ([place])    Elevated ferritin level    Fatty liver    GERD (gastroesophageal reflux disease)    GERD with stricture    Headache    "1-2/wk" (04/23/2017)   History of blood transfusion "plenty"   "related to anemia" (04/23/2017)   Hypertension    Inguinal hernia    Low back pain    Migraine    "1-2/month" (04/23/2017)   OA (osteoarthritis) of knee--left    Symptomatic anemia 04/23/2017    Vitamin B 12 deficiency    Ht 5' 6"  (1.676 m)   Wt 224 lb 9.6 oz (101.9 kg)   BMI 36.25 kg/m   Opioid Risk Score:   Fall Risk Score:  `1  Depression screen PHQ 2/9  No flowsheet data found.   Review of Systems  Constitutional: Negative.   HENT: Negative.    Eyes: Negative.   Respiratory: Negative.    Gastrointestinal: Negative.   Endocrine: Negative.   Genitourinary: Negative.   Musculoskeletal: Negative.   Skin: Negative.   Allergic/Immunologic: Negative.   Neurological:  Positive for numbness.  Hematological: Negative.   Psychiatric/Behavioral: Negative.        Objective:   Physical Exam  Gen: no distress, normal appearing HEENT: oral mucosa pink and moist, NCAT Cardio: Reg rate Chest: normal effort, normal rate of breathing Abd: soft, non-distended Ext: no edema Psych: pleasant, normal affect, easily excitable Skin: intact Neuro: Patient is alert and oriented with extra time.  Cranial nerve exam is unremarkable.  On cognitive testing she is able to spell the word world forward and backwards.  She is able to sequence simple numbers with extra time.  She had more difficulty with complex sequences.  Abstract thinking was fair to concrete.  She was aware of current events.  She remembered 1 out of 3 words after 5 minutes. Gait stable.  Musculoskeletal: Left knee with mild effusion and tender to palpation along the medial and lateral joint lines.  McMurray's test equivocal. Doesn't favor knee with gait      Assessment & Plan:  1.  Decline in ADL, mobility , and cognition secondary to anoxic brain injury -Patient may return to her home as long as she has intermittent supervision -I have not allowed her to return to driving unsupervised.  I did give her a return to driving plan which she can start under the eye of a family member.  She remains too distracted and has some ongoing short-term memory deficits which could make her driving unsafe at this point.  We will revisit  at her follow-up appointment 2. Left knee pain: ordered xrays left knee  -voltaren gel to left knee 4. Mood:               -off seroquel -vpa 722m tid--reduce to 5048mtid -continue celexa 2016mhs   6. T2DM per primary. 10. HTN: Monitor BP tid--continue Cozaar             Controlled  10/11  11. SVT/NSVT: On coreg and amiodarone bid for rate control.    -has cardiac MRI  -f/u with Dr. Irish Lack 12. Dysphagia:              -resolved  Thirty minutes of face to face patient care time were spent during this visit. All questions were encouraged and answered. Follow up with me in 6 weeks.

## 2021-03-10 ENCOUNTER — Telehealth (HOSPITAL_COMMUNITY): Payer: Self-pay | Admitting: Emergency Medicine

## 2021-03-10 ENCOUNTER — Ambulatory Visit (HOSPITAL_COMMUNITY): Payer: Medicaid Other

## 2021-03-10 NOTE — Telephone Encounter (Signed)
Reaching out to patient to offer assistance regarding upcoming cardiac imaging study; pt verbalizes understanding of appt date/time, parking situation and where to check in, and verified current allergies; name and call back number provided for further questions should they arise Marchia Bond RN Navigator Cardiac Imaging [facility] [phone] office [phone] cell  Denies iv issues Denies implants Denies claustro  Spoke with patients mother- pt with hx of anoxic brain injury

## 2021-03-13 ENCOUNTER — Ambulatory Visit (HOSPITAL_COMMUNITY): Admission: RE | Admit: 2021-03-13 | Payer: Medicaid Other | Source: Ambulatory Visit

## 2021-03-14 ENCOUNTER — Ambulatory Visit (HOSPITAL_COMMUNITY)
Admission: RE | Admit: 2021-03-14 | Discharge: 2021-03-14 | Disposition: A | Discharge status: Home / Self Care | Payer: Medicaid Other | Source: Ambulatory Visit | Attending: Interventional Cardiology | Admitting: Interventional Cardiology

## 2021-03-14 ENCOUNTER — Other Ambulatory Visit: Payer: Self-pay

## 2021-03-14 DIAGNOSIS — I5022 Chronic systolic (congestive) heart failure: Secondary | ICD-10-CM | POA: Diagnosis present

## 2021-03-14 DIAGNOSIS — I4901 Ventricular fibrillation: Secondary | ICD-10-CM | POA: Insufficient documentation

## 2021-03-14 DIAGNOSIS — I469 Cardiac arrest, cause unspecified: Secondary | ICD-10-CM | POA: Insufficient documentation

## 2021-03-14 MED ORDER — GADOBUTROL 1 MMOL/ML IV SOLN
14.0000 mL | Freq: Once | INTRAVENOUS | Status: AC | PRN
  Administered 2021-03-14: 14 mL via INTRAVENOUS

## 2021-03-31 ENCOUNTER — Encounter: Payer: Self-pay | Admitting: Cardiology

## 2021-03-31 ENCOUNTER — Other Ambulatory Visit: Payer: Self-pay

## 2021-03-31 ENCOUNTER — Ambulatory Visit (INDEPENDENT_AMBULATORY_CARE_PROVIDER_SITE_OTHER): Payer: Medicaid Other | Admitting: Cardiology

## 2021-03-31 VITALS — BP 124/80 | HR 66 | Ht 66.0 in | Wt 230.8 lb

## 2021-03-31 DIAGNOSIS — I469 Cardiac arrest, cause unspecified: Secondary | ICD-10-CM

## 2021-03-31 DIAGNOSIS — Z01818 Encounter for other preprocedural examination: Secondary | ICD-10-CM | POA: Diagnosis not present

## 2021-03-31 DIAGNOSIS — Z01812 Encounter for preprocedural laboratory examination: Secondary | ICD-10-CM

## 2021-03-31 NOTE — Patient Instructions (Addendum)
Medication Instructions:  Your physician recommends that you continue on your current medications as directed. Please refer to the Current Medication list given to you today.     * If you need a refill on your cardiac medications before your next appointment, please call your pharmacy. *   Labwork: Pre procedure lab work today: BMET & CBC  If you have labs (blood work) drawn today and your tests are completely normal, you will receive your results only by: [employer] (if you have MyChart) OR A paper copy in the mail If you have any lab test that is abnormal or we need to change your treatment, we will call you to review the results.   Testing/Procedures: Your physician has recommended that you have a defibrillator inserted. An implantable cardioverter defibrillator (ICD) is a small device that is placed in your chest or, in rare cases, your abdomen. This device uses electrical pulses or shocks to help control life-threatening, irregular heartbeats that could lead the heart to suddenly stop beating (sudden cardiac arrest). Leads are attached to the ICD that goes into your heart. This is done in the hospital and usually requires an overnight stay. Please follow the instructions below, located under the special instructions section.   Follow-Up: Your physician recommends that you schedule a wound check appointment 10-14 days, after your procedure on 04/27/21, with the device clinic.  Your physician recommends that you schedule a follow up appointment in 91 days, after your procedure on 04/27/21, with Dr. Curt Bears.   Thank you for choosing [facility]!!   Trinidad Curet, RN [phone]   Any Other Special Instructions Will Be Listed Below (If Applicable).     Implantable Device Instructions  You are scheduled for:                  _____ Subcutaneous Implantable Cardioverter Defibrillator  on  04/27/2021  with Dr. Curt Bears.  1.   Please arrive at the [facility], Entrance  "A"  at [facility] at  11:30 am on the day of your procedure. (The address is [address]  Do not eat or drink after midnight the night before your procedure.  3.   Complete pre procedure  lab work on 03/31/2021.  You do not have to be fasting.  4.   Hold all of your morning medications the morning of your procedure.  5.  Plan for an overnight stay.  Bring your insurance cards and a list of you medications.  6.  Wash your chest and neck with surgical scrub the evening before and the morning of  your procedure.  Rinse well. Please review the surgical scrub instruction sheet given to you.  7. FYI: For your safety, and to allow [place] to monitor your vital signs accurately during the surgery/procedure we request that if you have artificial nails, gel coating, SNS etc. Please have those removed prior to your surgery/procedure. Not having the nail coverings /polish removed may result in cancellation or delay of your surgery/procedure.                                                          * If you have ANY questions after you get home, please call Trinidad Curet, RN @ [phone].  * Every attempt is made to  prevent procedures from being rescheduled.  Due to the nature of  Electrophysiology, rescheduling can happen.  The physician is always aware and directs the staff when this occurs.     [facility] - Preparing For Surgery  Before surgery, you can play an important role. Because skin is not sterile, your skin needs to be as free of germs as possible. You can reduce the number of germs on your skin by washing with CHG (chlorahexidine gluconate) Soap before surgery.  CHG is an antiseptic cleaner which kills germs and bonds with the skin to continue killing germs even after washing.   Please do not use if you have an allergy to CHG or antibacterial soaps.  If your skin becomes reddened/irritated stop using the CHG.   Do not shave (including legs and underarms) for at least  48 hours prior to first CHG shower.  It is OK to shave your face.  Please follow these instructions carefully:  1.  Shower the night before surgery and the morning of surgery with CHG.  2.  If you choose to wash your hair, wash your hair first as usual with your normal shampoo.  3.  After you shampoo, rinse your hair and body thoroughly to remove the shampoo.  4.  Use CHG as you would any other liquid soap.  You can apply CHG directly to the skin and wash gently with a clean washcloth. 5.  Apply the CHG Soap to your body ONLY FROM THE NECK DOWN.  Do not use on open wounds or open sores.  Avoid contact with your eyes, ears, mouth and genitals (private parts).  Wash genitals (private parts) with your normal soap.  6.  Wash thoroughly, paying special attention to the area where your surgery will be performed.  7.  Thoroughly rinse your body with warm water from the neck down.   8.  DO NOT shower/wash with your normal soap after using and rinsing off the CHG soap.  9.  Pat yourself dry with a clean towel.           10.  Wear clean pajamas.           11.  Place clean sheets on your bed the night of your first shower and do not sleep with pets.  Day of Surgery: Do not apply any deodorants/lotions.  Please wear clean clothes to the hospital/surgery center.      Cardioverter Defibrillator Implantation An implantable cardioverter defibrillator (ICD) is a small, lightweight, battery-powered device that is placed (implanted) under the skin in the chest or abdomen. Your caregiver may prescribe an ICD if: You have had an irregular heart rhythm (arrhythmia) that originated in the lower chambers of the heart (ventricles). Your heart has been damaged by a disease (such as coronary artery disease) or heart condition (such as a heart attack). An ICD consists of a battery that lasts several years, a small computer called a pulse generator, and wires called leads that go into the heart. It is used to detect  and correct two dangerous arrhythmias: a rapid heart rhythm (tachycardia) and an arrhythmia in which the ventricles contract in an uncoordinated way (fibrillation). When an ICD detects tachycardia, it sends an electrical signal to the heart that restores the heartbeat to normal (cardioversion). This signal is usually painless. If cardioversion does not work or if the ICD detects fibrillation, it delivers a small electrical shock to the heart (defibrillation) to restart the heart. The shock may feel like a strong  jolt in the chest. ICDs may be programmed to correct other problems. Sometimes, ICDs are programmed to act as another type of implantable device called a pacemaker. Pacemakers are used to treat a slow heartbeat (bradycardia). LET YOUR CAREGIVER KNOW ABOUT: Any allergies you have. All medicines you are taking, including vitamins, herbs, eyedrops, and over-the-counter medicines and creams. Previous problems you or members of your family have had with the use of anesthetics. Any blood disorders you have had. Other health problems you have. RISKS AND COMPLICATIONS Generally, the procedure to implant an ICD is safe. However, as with any surgical procedure, complications can occur. Possible complications associated with implanting an ICD include: Swelling, bleeding, or bruising at the site where the ICD was implanted. Infection at the site where the ICD was implanted. A reaction to medicine used during the procedure. Nerve, heart, or blood vessel damage. Blood clots. BEFORE THE PROCEDURE You may need to have blood tests, heart tests, or a chest X-ray done before the day of the procedure. Ask your caregiver about changing or stopping your regular medicines. Make plans to have someone drive you home. You may need to stay in the hospital overnight after the procedure. Stop smoking at least 24 hours before the procedure. Take a bath or shower the night before the procedure. You may need to scrub  your chest or abdomen with a special type of soap. Do not eat or drink before your procedure for as long as directed by your caregiver. Ask if it is okay to take any needed medicine with a small sip of water. PROCEDURE  The procedure to implant an ICD in your chest or abdomen is usually done at a hospital in a room that has a large X-ray machine called a fluoroscope. The machine will be above you during the procedure. It will help your caregiver see your heart during the procedure. Implanting an ICD usually takes 1-3 hours. Before the procedure:  Small monitors will be put on your body. They will be used to check your heart, blood pressure, and oxygen level. A needle will be put into a vein in your hand or arm. This is called an intravenous (IV) access tube. Fluids and medicine will flow directly into your body through the IV tube. Your chest or abdomen will be cleaned with a germ-killing (antiseptic) solution. The area may be shaved. You may be given medicine to help you relax (sedative). You will be given a medicine called a local anesthetic. This medicine will make the surgical site numb while the ICD is implanted. You will be sleepy but awake during the procedure. After you are numb the procedure will begin. The caregiver will: Make a small cut (incision). This will make a pocket deep under your skin that will hold the pulse generator. Guide the leads through a large blood vessel into your heart and attach them to the heart muscles. Depending on the ICD, the leads may go into one ventricle or they may go to both ventricles and into an upper chamber of the heart (atrium). Test the ICD. Close the incision with stitches, glue, or staples. AFTER THE PROCEDURE You may feel pain. Some pain is normal. It may last a few days. You may stay in a recovery area until the local anesthetic has worn off. Your blood pressure and pulse will be checked often. You will be taken to a room where your heart will be  monitored. A chest X-ray will be taken. This is done to check that  the cardioverter defibrillator is in the right place. You may stay in the hospital overnight. A slight bump may be seen over the skin where the ICD was placed. Sometimes, it is possible to feel the ICD under the skin. This is normal. In the months and years afterward, your caregiver will check the device, the leads, and the battery every few months. Eventually, when the battery is low, the ICD will be replaced.   This information is not intended to replace advice given to you by your health care provider. Make sure you discuss any questions you have with your health care provider.   Document Released: 12/23/2001 Document Revised: 01/21/2013 Document Reviewed: 04/21/2012 Elsevier Interactive Patient Education 2016 [place] Defibrillator Implantation, Care After This sheet gives you information about how to care for yourself after your procedure. Your health care provider may also give you more specific instructions. If you have problems or questions, contact your health care provider. What can I expect after the procedure? After the procedure, it is common to have: Some pain. It may last a few days. A slight bump over the skin where the device was placed. Sometimes, it is possible to feel the device under the skin. This is normal.  During the months and years after your procedure, your health care provider will check the device, the leads, and the battery every few months. Eventually, when the battery is low, the device will be replaced. Follow these instructions at home: Medicines Take over-the-counter and prescription medicines only as told by your health care provider. If you were prescribed an antibiotic medicine, take it as told by your health care provider. Do not stop taking the antibiotic even if you start to feel better. Incision care  Follow instructions from your health care provider about  how to take care of your incision area. Make sure you: Wash your hands with soap and water before you change your bandage (dressing). If soap and water are not available, use hand sanitizer. Change your dressing as told by your health care provider. Leave stitches (sutures), skin glue, or adhesive strips in place. These skin closures may need to stay in place for 2 weeks or longer. If adhesive strip edges start to loosen and curl up, you may trim the loose edges. Do not remove adhesive strips completely unless your health care provider tells you to do that. Check your incision area every day for signs of infection. Check for: More redness, swelling, or pain. More fluid or blood. Warmth. Pus or a bad smell. Do not use lotions or ointments near the incision area unless told by your health care provider. Keep the incision area clean and dry for 2-3 days after the procedure or for as long as told by your health care provider. It takes several weeks for the incision site to heal completely. Do not take baths, swim, or use a hot tub until your health care provider approves. Activity Try to walk a little every day. Exercising is important after this procedure. Also, use your shoulder on the side of the defibrillator in daily tasks that do not require a lot of motion. For at least 6 weeks: Do not lift your upper arm above your shoulders. This means no tennis, golf, or swimming for this period of time. If you tend to sleep with your arm above your head, use a restraint to prevent this during sleep. Avoid sudden jerking, pulling, or chopping movements that pull your upper arm far away  from your body. Ask your health care provider when you may go back to work. Check with your health care provider before you start to drive or play sports. Electric and magnetic fields Tell all health care providers that you have a defibrillator. This may prevent them from giving you an MRI scan because strong magnets are used  for that test. If you must pass through a metal detector, quickly walk through it. Do not stop under the detector, and do not stand near it. Avoid places or objects that have a strong electric or magnetic field, including: Engineer, maintenance. At the airport, let officials know that you have a defibrillator. Your defibrillator ID card will let you be checked in a way that is safe for you and will not damage your defibrillator. Also, do not let a security person wave a magnetic wand near your defibrillator. That can make it stop working. Power plants. Large electrical generators. Anti-theft systems or electronic article surveillance (EAS). Radiofrequency transmission towers, such as cell phone and radio towers. Do not use amateur (ham) radio equipment or electric (arc) welding torches. Some devices are safe to use if held at least 12 inches (30 cm) from your defibrillator. These include power tools, lawn mowers, and speakers. If you are unsure if something is safe to use, ask your health care provider. Do not use MP3 player headphones. They have magnets. You may safely use electric blankets, heating pads, computers, and microwave ovens. When using your cell phone, hold it to the ear that is on the opposite side from the defibrillator. Do not leave your cell phone in a pocket over the defibrillator. General instructions Follow diet instructions from your health care provider, if this applies. Always keep your defibrillator ID card with you. The card should list the implant date, device model, and manufacturer. Consider wearing a medical alert bracelet or necklace. Have your defibrillator checked every 3-6 months or as often as told by your health care provider. Most defibrillators last for 4-8 years. Keep all follow-up visits as told by your health care provider. This is important for your health care provider to make sure your chest is healing the way it should. Ask your health care provider when  you should come back to have your stitches or staples taken out. Contact a health care provider if: You feel one shock in your chest. You gain weight suddenly. Your legs or feet swell more than they have before. It feels like your heart is fluttering or skipping beats (heart palpitations). You have more redness, swelling, or pain around your incision. You have more fluid or blood coming from your incision. Your incision feels warm to the touch. You have pus or a bad smell coming from your incision. You have a fever. Get help right away if: You have chest pain. You feel more than one shock. You feel more short of breath than you have felt before. You feel more light-headed than you have felt before. Your incision starts to open up. This information is not intended to replace advice given to you by your health care provider. Make sure you discuss any questions you have with your health care provider. Document Released: 10/20/2004 Document Revised: 10/21/2015 Document Reviewed: 09/07/2015 Elsevier Interactive Patient Education  2018 [place] Discharge Instructions for  Pacemaker/Defibrillator Patients  ACTIVITY No heavy lifting or vigorous activity with your left/right arm for 6 to 8 weeks.  Do not raise your left/right arm above your head  for one week.  Gradually raise your affected arm as drawn below.           __  NO DRIVING for     ; you may begin driving on     .  WOUND CARE Keep the wound area clean and dry.  Do not get this area wet for one week. No showers for one week; you may shower on     . The tape/steri-strips on your wound will fall off; do not pull them off.  No bandage is needed on the site.  DO  NOT apply any creams, oils, or ointments to the wound area. If you notice any drainage or discharge from the wound, any swelling or bruising at the site, or you develop a fever > 101? F after you are discharged home, call the office at  once.  SPECIAL INSTRUCTIONS You are still able to use cellular telephones; use the ear opposite the side where you have your pacemaker/defibrillator.  Avoid carrying your cellular phone near your device. When traveling through airports, show security personnel your identification card to avoid being screened in the metal detectors.  Ask the security personnel to use the hand wand. Avoid arc welding equipment, MRI testing (magnetic resonance imaging), TENS units (transcutaneous nerve stimulators).  Call the office for questions about other devices. Avoid electrical appliances that are in poor condition or are not properly grounded. Microwave ovens are safe to be near or to operate.  ADDITIONAL INFORMATION FOR DEFIBRILLATOR PATIENTS SHOULD YOUR DEVICE GO OFF: If your device goes off ONCE and you feel fine afterward, notify the device clinic nurses. If your device goes off ONCE and you do not feel well afterward, call 911. If your device goes off TWICE, call 911. If your device goes off THREE TIMES IN ONE DAY, call 911.  DO NOT DRIVE YOURSELF OR A FAMILY MEMBER WITH A DEFIBRILLATOR TO THE HOSPITAL--CALL 911.

## 2021-03-31 NOTE — H&P (View-Only) (Signed)
Electrophysiology Office Note   Date:  03/31/2021   ID:  Courtney Davies, DOB [birth year], MRN [id number]  PCP:  [facility]  Cardiologist:  Irish Lack Primary Electrophysiologist:  Celise Bazar Meredith Leeds, MD    Chief Complaint: cardiac arrest   History of Present Illness: Courtney Davies is a 45 y.o. female who is being seen today for the evaluation of cardiac arrest at the request of Courtney Booze, MD. Presenting today for electrophysiology evaluation.  She has a history significant for type 2 diabetes, hypertension.  She had a cardiac arrest August 2022.  She had ventricular fibrillation during arrest and required multiple defibrillations.  She possibly had a respiratory event prior to this.  After being discharged from the hospital, she was sent to rehab.  She made a good recovery and returned home 01/24/2021.  At the time of her arrest, she was at work.  She had a witnessed arrest.  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  When EMS arrived she was in ventricular fibrillation.  She received 5 rounds of epinephrine, 5 episodes of defibrillation and given 400 mg of amiodarone.  On arrival to the emergency room, she was intubated.  CT of the chest showed no PE.  Today, she denies symptoms of palpitations, chest pain, shortness of breath, orthopnea, PND, lower extremity edema, claudication, dizziness, presyncope, syncope, bleeding, or neurologic sequela. The patient is tolerating medications without difficulties.  She is currently feeling well.  Her main issue is memory.  This has been improving since her hospitalization.   Past Medical History:  Diagnosis Date   Anemia 09/2015   Anemia    Arthritis    "knees" (04/23/2017)   Asthma    "teens; went away; came back" (04/23/2017)   B12 deficiency anemia 04/23/2017   Chronic bronchitis (HCC)    Chronic low back pain with sciatica    Chronic lower back pain     Diabetes mellitus without complication ([place])    Diabetes type 2, controlled ([place])    Elevated ferritin level    Fatty liver    GERD (gastroesophageal reflux disease)    GERD with stricture    Headache    "1-2/wk" (04/23/2017)   History of blood transfusion "plenty"   "related to anemia" (04/23/2017)   Hypertension    Inguinal hernia    Low back pain    Migraine    "1-2/month" (04/23/2017)   OA (osteoarthritis) of knee--left    Symptomatic anemia 04/23/2017   Vitamin B 12 deficiency    Past Surgical History:  Procedure Laterality Date   ABDOMINAL HYSTERECTOMY  YRS AGO   COMPLETE   CESAREAN SECTION  1998; 2002   ESOPHAGEAL DILATION  02/2020   by Dr Shana Chute   INGUINAL HERNIA REPAIR Bilateral 1980s    "total of 4 surgeries" (04/23/2017)   IR GASTROSTOMY TUBE MOD SED  12/13/2020   KNEE ARTHROSCOPY WITH MEDIAL MENISECTOMY Left 01/30/2018   Procedure: LEFT KNEE ARTHROSCOPY WITH PARTIAL LATERAL MENISCECTOMY;  Surgeon: Mcarthur Rossetti, MD;  Location: [facility];  Service: Orthopedics;  Laterality: Left;   KNEE CARTILAGE SURGERY Left    TUBAL LIGATION  2002     Current Outpatient Medications  Medication Sig Dispense Refill   albuterol (VENTOLIN HFA) 108 (90 Base) MCG/ACT inhaler Inhale 2 puffs into the lungs every 6 (six) hours as needed for wheezing or shortness of breath. 18 g 2   amiodarone (  PACERONE) 200 MG tablet Take 1 tablet (200 mg total) by mouth daily. 90 tablet 3   carvedilol (COREG) 6.25 MG tablet Take 1 tablet (6.25 mg total) by mouth 2 (two) times daily with a meal. 60 tablet 0   citalopram (CELEXA) 20 MG tablet Take 1 tablet (20 mg total) by mouth at bedtime. 30 tablet 0   diclofenac Sodium (VOLTAREN) 1 % GEL Apply 1 application topically 3 (three) times daily. To left knee 150 g 4   divalproex (DEPAKOTE) 250 MG DR tablet Take 2 tablets (500 mg total) by mouth 3 (three) times daily. 180 tablet 0   docusate sodium (COLACE) 100 MG capsule Take 2 capsules (200 mg total) by  mouth daily. 60 capsule 0   feeding supplement (ENSURE ENLIVE / ENSURE PLUS) LIQD Take 237 mLs by mouth 3 (three) times daily between meals. 237 mL 12   Insulin Pen Needle (BD PEN NEEDLE NANO U/F) 32G X 4 MM MISC Use as directed twice daily. 100 each 0   losartan (COZAAR) 25 MG tablet Take 1 tablet (25 mg total) by mouth daily. 30 tablet 0   Multiple Vitamin (MULTIVITAMIN WITH MINERALS) TABS tablet Take 1 tablet by mouth daily.     nicotine (NICODERM CQ - DOSED IN MG/24 HOURS) 21 mg/24hr patch Place 1 patch (21 mg total) onto the skin daily. 28 patch 0   ondansetron (ZOFRAN-ODT) 4 MG disintegrating tablet Take 4 mg by mouth every 8 (eight) hours as needed.     pantoprazole (PROTONIX) 40 MG tablet Take 1 tablet (40 mg total) by mouth 2 (two) times daily. 60 tablet 0   QUEtiapine (SEROQUEL) 50 MG tablet Take 1 tablet (50 mg total) by mouth 2 (two) times daily AND 2 tablets (100 mg total) at bedtime. 120 tablet 0   acetaminophen (TYLENOL) 325 MG tablet Take 2 tablets (650 mg total) by mouth every 6 (six) hours. (Patient not taking: Reported on 03/31/2021) 200 tablet 0   insulin glargine (LANTUS) 100 UNIT/ML Solostar Pen Inject 18 Units into the skin daily AND 10 Units at bedtime. (Patient not taking: Reported on 03/31/2021) 15 mL 0   No current facility-administered medications for this visit.    Allergies:   Other, Other, Latex, Codeine, Codeine, Latex, and Nitrofurantoin   Social History:  The patient  reports that she has an unknown smoking status. She has never used smokeless tobacco. She reports that she does not currently use alcohol. She reports that she does not use drugs.   Family History:  The patient's family history includes Anemia in her paternal grandmother; Diabetes in her father, maternal grandmother, and mother; Hypertension in her mother; Prostate cancer in her maternal uncle; Stroke in her maternal grandmother and mother; Valvular heart disease in her paternal grandmother.     ROS:  Please see the history of present illness.   Otherwise, review of systems is positive for none.   All other systems are reviewed and negative.    PHYSICAL EXAM: VS:  BP 124/80    Pulse 66    Ht 5' 6"  (1.676 m)    Wt 230 lb 12.8 oz (104.7 kg)    SpO2 99%    BMI 37.25 kg/m  , BMI Body mass index is 37.25 kg/m. GEN: Well nourished, well developed, in no acute distress  HEENT: normal  Neck: no JVD, carotid bruits, or masses Cardiac: RRR; no murmurs, rubs, or gallops,no edema  Respiratory:  clear to auscultation bilaterally, normal work of breathing GI:  soft, nontender, nondistended, + BS MS: no deformity or atrophy  Skin: warm and dry Neuro:  Strength and sensation are intact Psych: euthymic mood, full affect  EKG:  EKG is ordered today. Personal review of the ekg ordered shows sinus rhythm, rate 66  Recent Labs: 12/01/2020: B Natriuretic Peptide 37.2 12/05/2020: TSH 2.467 12/14/2020: Magnesium 2.0 01/16/2021: Hemoglobin 11.2; Platelets 303 01/19/2021: ALT 18 01/23/2021: BUN 12; Creatinine, Ser 0.87; Potassium 4.2; Sodium 138    Lipid Panel     Component Value Date/Time   CHOL 163 12/02/2020 0834   TRIG 151 (H) 12/07/2020 0510   HDL 33 (L) 12/02/2020 0834   CHOLHDL 4.9 12/02/2020 0834   VLDL 40 12/02/2020 0834   LDLCALC 90 12/02/2020 0834     Wt Readings from Last 3 Encounters:  03/31/21 230 lb 12.8 oz (104.7 kg)  03/08/21 224 lb 9.6 oz (101.9 kg)  01/25/21 214 lb 9.6 oz (97.3 kg)      Other studies Reviewed: Additional studies/ records that were reviewed today include: CMRI 03/14/21  Review of the above records today demonstrates:  1. Normal LV size, mild hypertrophy, and low normal systolic function (EF 80%). Septal bounce   2.  Normal RV size and systolic function (EF 88%)   3.  No late gadolinium enhancement   4.  Mild mitral regurgitation (regurgitant fraction 11%)  Coronary CTA 02/02/2021 1. No evidence of CAD (0%). Consider non-atherosclerotic  causes of chest pain.  ASSESSMENT AND PLAN:  1.  Ventricular fibrillation/cardiac arrest: Required several defibrillations during her code.  Coronary CT is without abnormality.  Cardiac MRI shows no evidence of infiltrative disease with an ejection fraction of 53%.  Is unclear to me at this time as to the cause of her arrhythmias.  She would benefit from ICD implant.  We Areyana Leoni plan for S ICD.  Risk and benefits of been discussed with bleeding and infection.  She understands these risks and has agreed to the procedure.  2.  Paroxysmal atrial fibrillation: Currently on amiodarone 200 mg daily.  Feeling well without issue.  We Taziyah Iannuzzi continue amiodarone for now.  Case discussed with primary cardiology  Current medicines are reviewed at length with the patient today.   The patient does not have concerns regarding her medicines.  The following changes were made today:  none  Labs/ tests ordered today include:  Orders Placed This Encounter  Procedures   Basic metabolic panel   CBC   EKG 12-Lead     Disposition:   FU with Kaysha Parsell 3 months  Signed, Kerina Simoneau Meredith Leeds, MD  03/31/2021 1:48 PM     [place]  [address] [phone] (office) [phone] (fax)

## 2021-03-31 NOTE — Progress Notes (Signed)
Electrophysiology Office Note   Date:  03/31/2021   ID:  Courtney Davies, DOB [birth year], MRN [id number]  PCP:  [facility]  Cardiologist:  Irish Lack Primary Electrophysiologist:  Carnetta Losada Meredith Leeds, MD    Chief Complaint: cardiac arrest   History of Present Illness: Courtney Davies is a 45 y.o. female who is being seen today for the evaluation of cardiac arrest at the request of Jettie Booze, MD. Presenting today for electrophysiology evaluation.  She has a history significant for type 2 diabetes, hypertension.  She had a cardiac arrest August 2022.  She had ventricular fibrillation during arrest and required multiple defibrillations.  She possibly had a respiratory event prior to this.  After being discharged from the hospital, she was sent to rehab.  She made a good recovery and returned home 01/24/2021.  At the time of her arrest, she was at work.  She had a witnessed arrest.  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  When EMS arrived she was in ventricular fibrillation.  She received 5 rounds of epinephrine, 5 episodes of defibrillation and given 400 mg of amiodarone.  On arrival to the emergency room, she was intubated.  CT of the chest showed no PE.  Today, she denies symptoms of palpitations, chest pain, shortness of breath, orthopnea, PND, lower extremity edema, claudication, dizziness, presyncope, syncope, bleeding, or neurologic sequela. The patient is tolerating medications without difficulties.  She is currently feeling well.  Her main issue is memory.  This has been improving since her hospitalization.   Past Medical History:  Diagnosis Date   Anemia 09/2015   Anemia    Arthritis    "knees" (04/23/2017)   Asthma    "teens; went away; came back" (04/23/2017)   B12 deficiency anemia 04/23/2017   Chronic bronchitis (HCC)    Chronic low back pain with sciatica    Chronic lower back pain     Diabetes mellitus without complication ([place])    Diabetes type 2, controlled ([place])    Elevated ferritin level    Fatty liver    GERD (gastroesophageal reflux disease)    GERD with stricture    Headache    "1-2/wk" (04/23/2017)   History of blood transfusion "plenty"   "related to anemia" (04/23/2017)   Hypertension    Inguinal hernia    Low back pain    Migraine    "1-2/month" (04/23/2017)   OA (osteoarthritis) of knee--left    Symptomatic anemia 04/23/2017   Vitamin B 12 deficiency    Past Surgical History:  Procedure Laterality Date   ABDOMINAL HYSTERECTOMY  YRS AGO   COMPLETE   CESAREAN SECTION  1998; 2002   ESOPHAGEAL DILATION  02/2020   by Dr Shana Chute   INGUINAL HERNIA REPAIR Bilateral 1980s    "total of 4 surgeries" (04/23/2017)   IR GASTROSTOMY TUBE MOD SED  12/13/2020   KNEE ARTHROSCOPY WITH MEDIAL MENISECTOMY Left 01/30/2018   Procedure: LEFT KNEE ARTHROSCOPY WITH PARTIAL LATERAL MENISCECTOMY;  Surgeon: Mcarthur Rossetti, MD;  Location: [facility];  Service: Orthopedics;  Laterality: Left;   KNEE CARTILAGE SURGERY Left    TUBAL LIGATION  2002     Current Outpatient Medications  Medication Sig Dispense Refill   albuterol (VENTOLIN HFA) 108 (90 Base) MCG/ACT inhaler Inhale 2 puffs into the lungs every 6 (six) hours as needed for wheezing or shortness of breath. 18 g 2   amiodarone (  PACERONE) 200 MG tablet Take 1 tablet (200 mg total) by mouth daily. 90 tablet 3   carvedilol (COREG) 6.25 MG tablet Take 1 tablet (6.25 mg total) by mouth 2 (two) times daily with a meal. 60 tablet 0   citalopram (CELEXA) 20 MG tablet Take 1 tablet (20 mg total) by mouth at bedtime. 30 tablet 0   diclofenac Sodium (VOLTAREN) 1 % GEL Apply 1 application topically 3 (three) times daily. To left knee 150 g 4   divalproex (DEPAKOTE) 250 MG DR tablet Take 2 tablets (500 mg total) by mouth 3 (three) times daily. 180 tablet 0   docusate sodium (COLACE) 100 MG capsule Take 2 capsules (200 mg total) by  mouth daily. 60 capsule 0   feeding supplement (ENSURE ENLIVE / ENSURE PLUS) LIQD Take 237 mLs by mouth 3 (three) times daily between meals. 237 mL 12   Insulin Pen Needle (BD PEN NEEDLE NANO U/F) 32G X 4 MM MISC Use as directed twice daily. 100 each 0   losartan (COZAAR) 25 MG tablet Take 1 tablet (25 mg total) by mouth daily. 30 tablet 0   Multiple Vitamin (MULTIVITAMIN WITH MINERALS) TABS tablet Take 1 tablet by mouth daily.     nicotine (NICODERM CQ - DOSED IN MG/24 HOURS) 21 mg/24hr patch Place 1 patch (21 mg total) onto the skin daily. 28 patch 0   ondansetron (ZOFRAN-ODT) 4 MG disintegrating tablet Take 4 mg by mouth every 8 (eight) hours as needed.     pantoprazole (PROTONIX) 40 MG tablet Take 1 tablet (40 mg total) by mouth 2 (two) times daily. 60 tablet 0   QUEtiapine (SEROQUEL) 50 MG tablet Take 1 tablet (50 mg total) by mouth 2 (two) times daily AND 2 tablets (100 mg total) at bedtime. 120 tablet 0   acetaminophen (TYLENOL) 325 MG tablet Take 2 tablets (650 mg total) by mouth every 6 (six) hours. (Patient not taking: Reported on 03/31/2021) 200 tablet 0   insulin glargine (LANTUS) 100 UNIT/ML Solostar Pen Inject 18 Units into the skin daily AND 10 Units at bedtime. (Patient not taking: Reported on 03/31/2021) 15 mL 0   No current facility-administered medications for this visit.    Allergies:   Other, Other, Latex, Codeine, Codeine, Latex, and Nitrofurantoin   Social History:  The patient  reports that she has an unknown smoking status. She has never used smokeless tobacco. She reports that she does not currently use alcohol. She reports that she does not use drugs.   Family History:  The patient's family history includes Anemia in her paternal grandmother; Diabetes in her father, maternal grandmother, and mother; Hypertension in her mother; Prostate cancer in her maternal uncle; Stroke in her maternal grandmother and mother; Valvular heart disease in her paternal grandmother.     ROS:  Please see the history of present illness.   Otherwise, review of systems is positive for none.   All other systems are reviewed and negative.    PHYSICAL EXAM: VS:  BP 124/80    Pulse 66    Ht 5' 6"  (1.676 m)    Wt 230 lb 12.8 oz (104.7 kg)    SpO2 99%    BMI 37.25 kg/m  , BMI Body mass index is 37.25 kg/m. GEN: Well nourished, well developed, in no acute distress  HEENT: normal  Neck: no JVD, carotid bruits, or masses Cardiac: RRR; no murmurs, rubs, or gallops,no edema  Respiratory:  clear to auscultation bilaterally, normal work of breathing GI:  soft, nontender, nondistended, + BS MS: no deformity or atrophy  Skin: warm and dry Neuro:  Strength and sensation are intact Psych: euthymic mood, full affect  EKG:  EKG is ordered today. Personal review of the ekg ordered shows sinus rhythm, rate 66  Recent Labs: 12/01/2020: B Natriuretic Peptide 37.2 12/05/2020: TSH 2.467 12/14/2020: Magnesium 2.0 01/16/2021: Hemoglobin 11.2; Platelets 303 01/19/2021: ALT 18 01/23/2021: BUN 12; Creatinine, Ser 0.87; Potassium 4.2; Sodium 138    Lipid Panel     Component Value Date/Time   CHOL 163 12/02/2020 0834   TRIG 151 (H) 12/07/2020 0510   HDL 33 (L) 12/02/2020 0834   CHOLHDL 4.9 12/02/2020 0834   VLDL 40 12/02/2020 0834   LDLCALC 90 12/02/2020 0834     Wt Readings from Last 3 Encounters:  03/31/21 230 lb 12.8 oz (104.7 kg)  03/08/21 224 lb 9.6 oz (101.9 kg)  01/25/21 214 lb 9.6 oz (97.3 kg)      Other studies Reviewed: Additional studies/ records that were reviewed today include: CMRI 03/14/21  Review of the above records today demonstrates:  1. Normal LV size, mild hypertrophy, and low normal systolic function (EF 57%). Septal bounce   2.  Normal RV size and systolic function (EF 84%)   3.  No late gadolinium enhancement   4.  Mild mitral regurgitation (regurgitant fraction 11%)  Coronary CTA 02/02/2021 1. No evidence of CAD (0%). Consider non-atherosclerotic  causes of chest pain.  ASSESSMENT AND PLAN:  1.  Ventricular fibrillation/cardiac arrest: Required several defibrillations during her code.  Coronary CT is without abnormality.  Cardiac MRI shows no evidence of infiltrative disease with an ejection fraction of 53%.  Is unclear to me at this time as to the cause of her arrhythmias.  She would benefit from ICD implant.  We Wanda Rideout plan for S ICD.  Risk and benefits of been discussed with bleeding and infection.  She understands these risks and has agreed to the procedure.  2.  Paroxysmal atrial fibrillation: Currently on amiodarone 200 mg daily.  Feeling well without issue.  We Dior Stepter continue amiodarone for now.  Case discussed with primary cardiology  Current medicines are reviewed at length with the patient today.   The patient does not have concerns regarding her medicines.  The following changes were made today:  none  Labs/ tests ordered today include:  Orders Placed This Encounter  Procedures   Basic metabolic panel   CBC   EKG 12-Lead     Disposition:   FU with John Williamsen 3 months  Signed, Jeanluc Wegman Meredith Leeds, MD  03/31/2021 1:48 PM     [place] [address] [phone] (office) [phone] (fax)

## 2021-04-01 LAB — BASIC METABOLIC PANEL
BUN/Creatinine Ratio: 18 (ref 9–23)
BUN: 12 mg/dL (ref 6–24)
CO2: 21 mmol/L (ref 20–29)
Calcium: 9.2 mg/dL (ref 8.7–10.2)
Chloride: 101 mmol/L (ref 96–106)
Creatinine, Ser: 0.66 mg/dL (ref 0.57–1.00)
Glucose: 176 mg/dL — ABNORMAL HIGH (ref 70–99)
Potassium: 4.6 mmol/L (ref 3.5–5.2)
Sodium: 138 mmol/L (ref 134–144)
eGFR: 111 mL/min/{1.73_m2} (ref >59)

## 2021-04-01 LAB — CBC
Hematocrit: 40.8 % (ref 34.0–46.6)
Hemoglobin: 12.4 g/dL (ref 11.1–15.9)
MCH: 22.8 pg — ABNORMAL LOW (ref 26.6–33.0)
MCHC: 30.4 g/dL — ABNORMAL LOW (ref 31.5–35.7)
MCV: 75 fL — ABNORMAL LOW (ref 79–97)
Platelets: 248 10*3/uL (ref 150–450)
RBC: 5.44 x10E6/uL — ABNORMAL HIGH (ref 3.77–5.28)
RDW: 13.3 % (ref 11.7–15.4)
WBC: 7.1 10*3/uL (ref 3.4–10.8)

## 2021-04-06 NOTE — Progress Notes (Signed)
Spoke with DR. Reed and patient about need for outpatient speech therapy.  Order and information faxed to [facility] to see if they can accept patient and follow up with patient.  Phone: [phone] Fax: [phone] Report to palliative team

## 2021-04-23 IMAGING — MR MRI OF THE LEFT KNEE WITHOUT CONTRAST
4 of 6 series · 23 of 40 positions shown · non-contrast
Comparison: Radiographs 08/07/2018.  MRI 10/26/2017.

CLINICAL DATA: Knee pain for 1 year. Surgery 6 months ago. No acute
injury.

EXAM:
MRI OF THE LEFT KNEE WITHOUT CONTRAST
TECHNIQUE: Multiplanar, multisequence MR imaging of the knee was performed. No
intravenous contrast was administered.

[Series 4: T2 fat-sat · coronal · 4.0mm · 0.59mm/px · 6 of 29 slices shown (1 of 2)]
[im 1/29]
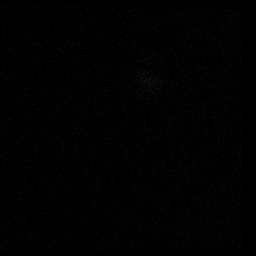
[im 6/29]
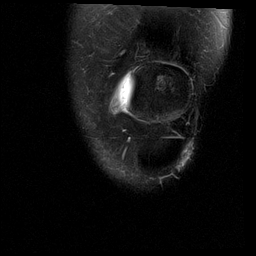
[im 12/29]
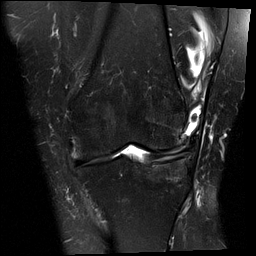
[im 17/29]
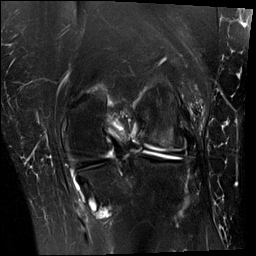
[im 23/29]
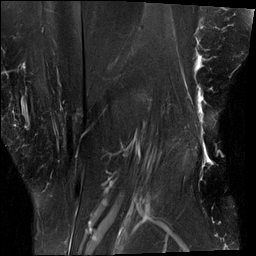
[im 29/29]
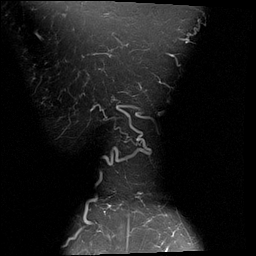

[Series 5: T1 · coronal · 4.0mm · 0.29mm/px · 3 of 29 slices shown]
[im 6/29]
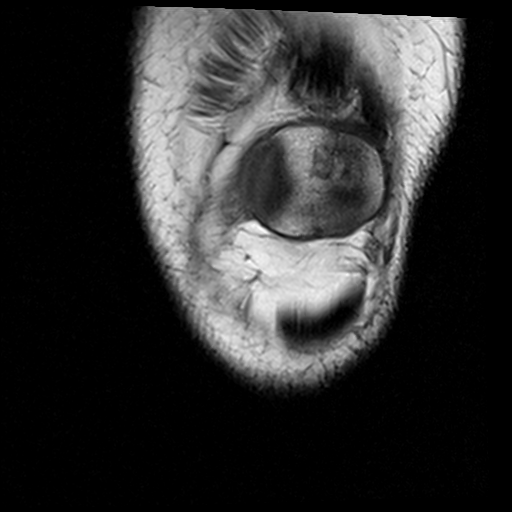
[im 17/29]
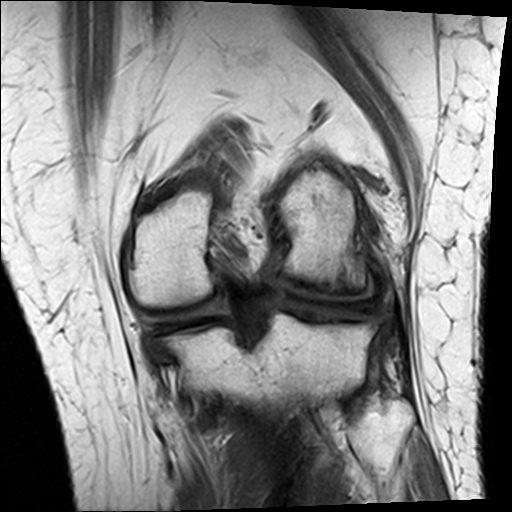
[im 29/29]
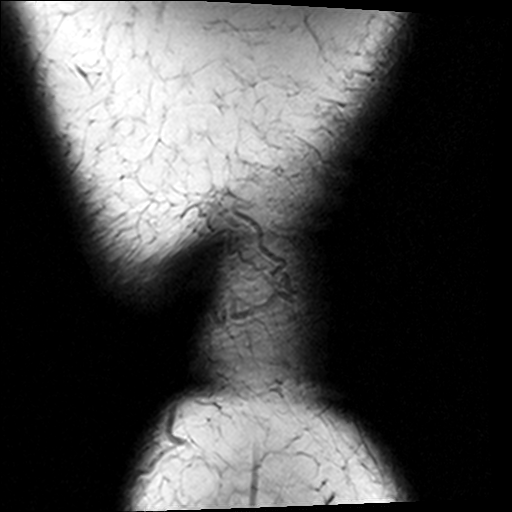

[Series 7: PD fat-sat · sagittal · 3.0mm · 0.29mm/px · 7 of 30 slices shown]
[im 1/30]
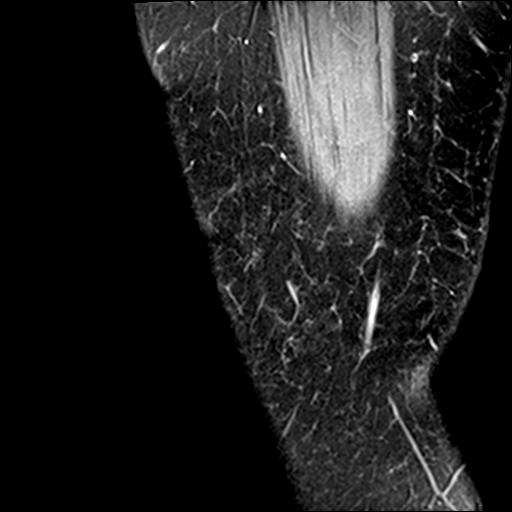
[im 5/30]
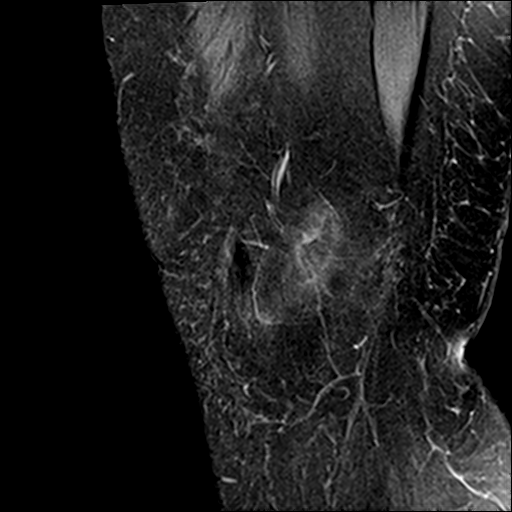
[im 10/30]
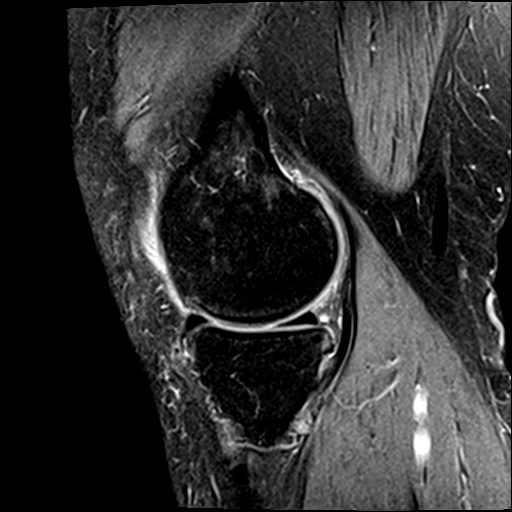
[im 15/30]
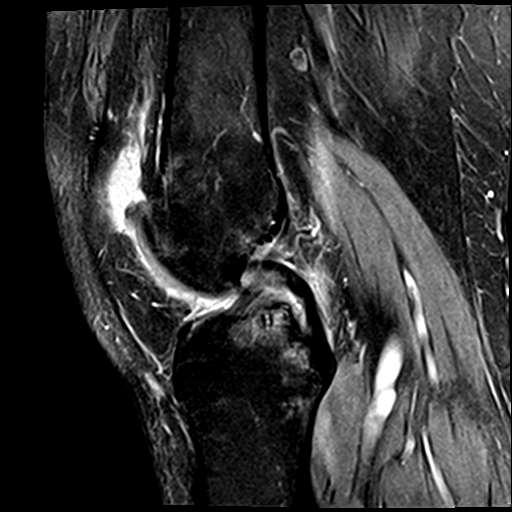
[im 20/30]
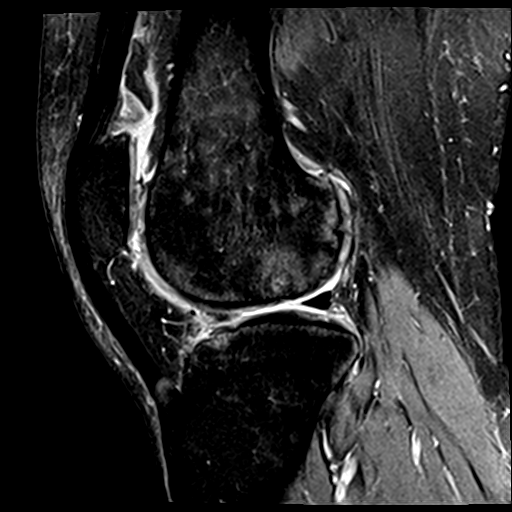
[im 25/30]
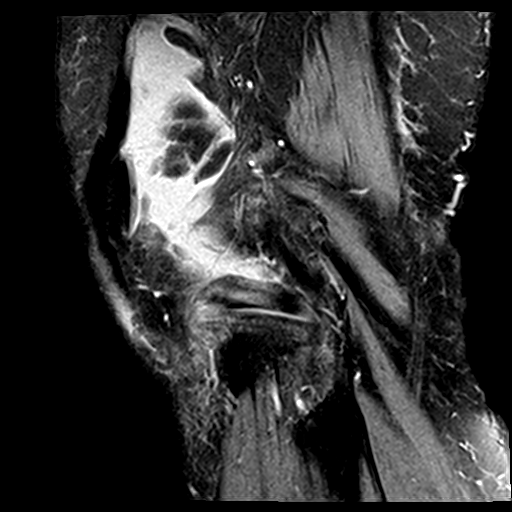
[im 30/30]
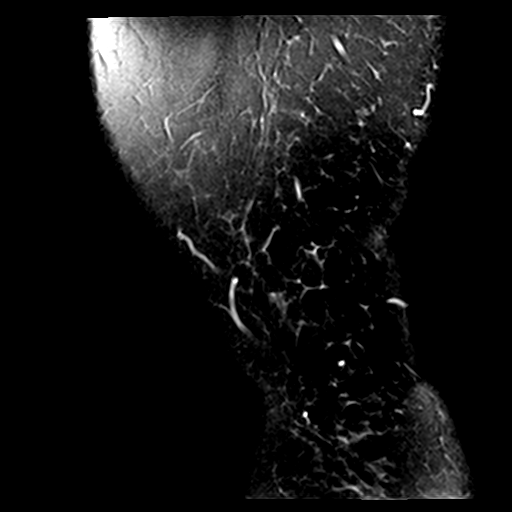

[Series 8: T2 fat-sat · sagittal · 3.0mm · 0.29mm/px · 7 of 30 slices shown (2 of 2)]
[im 1/30]
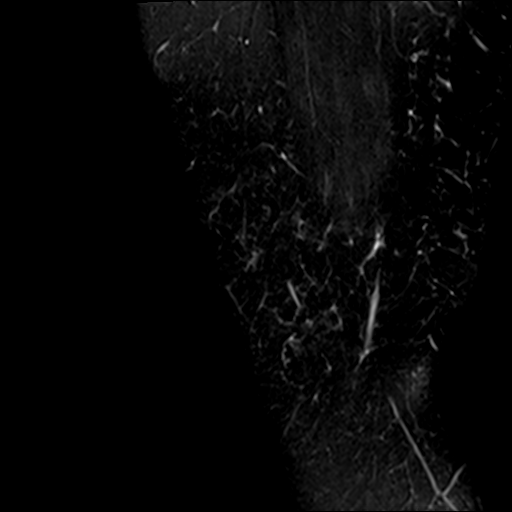
[im 5/30]
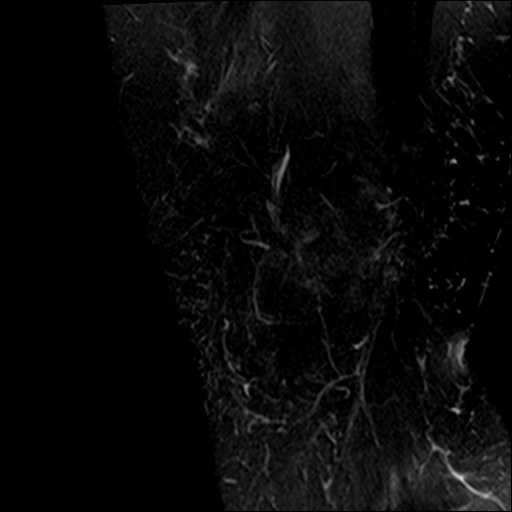
[im 10/30]
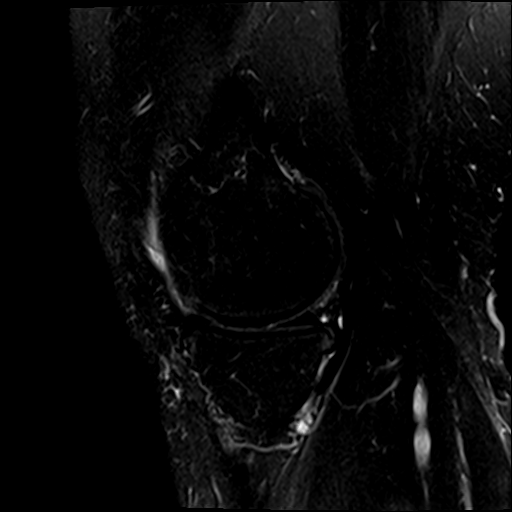
[im 15/30]
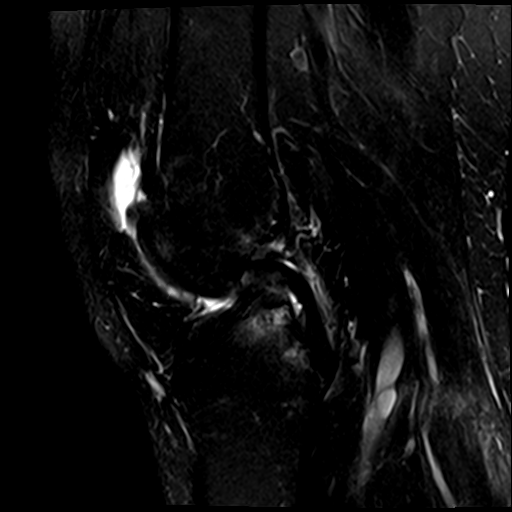
[im 20/30]
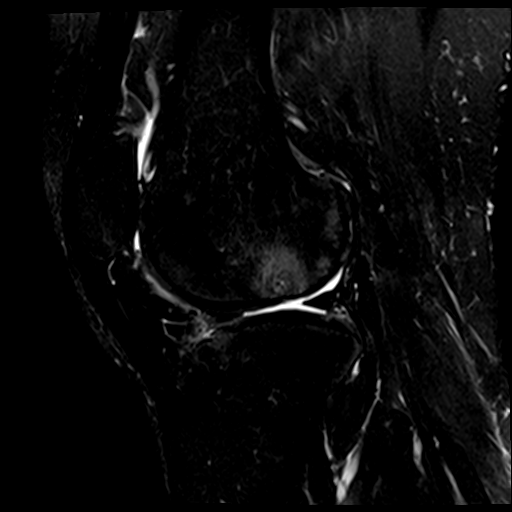
[im 25/30]
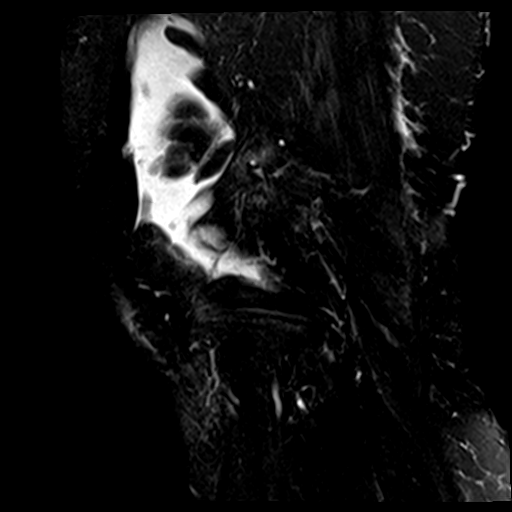
[im 30/30]
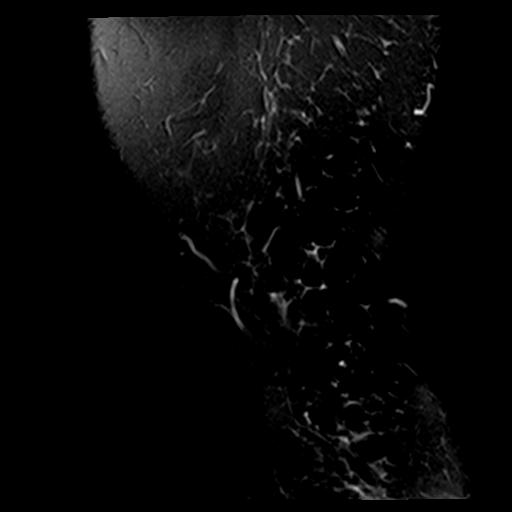

[23 of 40 positions shown; findings below may reference images not displayed]

FINDINGS: MENISCI

Medial meniscus:  Intact with normal morphology.

Lateral meniscus: Interval partial lateral meniscectomy with
debridement of the anterior horn and body. The meniscus is partially
extruded peripherally from the joint, but no recurrent meniscal tear
or displaced meniscal fragment identified.

LIGAMENTS

Cruciates:  Intact.

Collaterals:  Intact.

CARTILAGE

Patellofemoral: Similar patellofemoral chondral thinning, surface
irregularity and osteophyte formation. There is central subchondral
cyst formation superiorly in the lateral facet.

Medial:  Mild chondral thinning without focal defect.

Lateral: Progressive chondral thinning, surface irregularity and
subchondral edema. There are peripheral osteophytes.

MISCELLANEOUS

Joint:  Moderate-sized joint effusion, similar to previous study.

Popliteal Fossa: No significant Baker's cyst. There is a probable
tubular ganglion extending inferiorly along the posterior aspect of
the medial tibial plateau.

Extensor Mechanism:  Intact.

Bones: As above, progressive subchondral edema and cyst formation in
the lateral compartment. No acute osseous findings.

Other: No other significant periarticular soft tissue findings.
IMPRESSION: 1. Interval partial lateral meniscectomy without evidence of
recurrent meniscal tear.
2. The medial meniscus, cruciate and collateral ligaments are
intact.
3. Age advanced tricompartmental degenerative changes, progressive
in the lateral compartment.

## 2021-04-26 ENCOUNTER — Telehealth: Payer: Self-pay | Admitting: *Deleted

## 2021-04-26 DIAGNOSIS — R059 Cough, unspecified: Secondary | ICD-10-CM | POA: Insufficient documentation

## 2021-04-26 NOTE — Telephone Encounter (Signed)
Called pt to follow up after receiving call from EP PA at hospital. Pt seemed confused as to her procedure tomorrow when on the phone with hopsital pre op. Called pt who thought she was getting a stent.  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  Pt did state she recalled that. She does not have her paperwork/instructions from last month. Discussed possibly postponing procedure but pt does NOT want to do that. Reviewed instructions w/ pt. Aware NPO after MN tonight, to arrive to [facility] at 10:30am for pre procedure Covid testing (pt reported cough to hospital pre op staff).  No morning medications tomorrow. MD notified and pt aware he is going to further discuss/re-educate tomorrow prior to procedure. Patient verbalized understanding and agreeable to plan.

## 2021-04-27 ENCOUNTER — Ambulatory Visit (HOSPITAL_COMMUNITY)
Admission: RE | Admit: 2021-04-27 | Discharge: 2021-04-27 | Disposition: A | Discharge status: Home / Self Care | Payer: Medicaid Other | Attending: Cardiology | Admitting: Cardiology

## 2021-04-27 ENCOUNTER — Encounter (HOSPITAL_COMMUNITY)
Admission: RE | Disposition: A | Discharge status: Home / Self Care | Payer: Medicaid Other | Source: Home / Self Care | Attending: Cardiology

## 2021-04-27 ENCOUNTER — Ambulatory Visit (HOSPITAL_COMMUNITY): Payer: Medicaid Other | Admitting: Anesthesiology

## 2021-04-27 ENCOUNTER — Other Ambulatory Visit: Payer: Self-pay

## 2021-04-27 ENCOUNTER — Other Ambulatory Visit (HOSPITAL_COMMUNITY): Payer: Self-pay

## 2021-04-27 DIAGNOSIS — E119 Type 2 diabetes mellitus without complications: Secondary | ICD-10-CM | POA: Insufficient documentation

## 2021-04-27 DIAGNOSIS — Z8674 Personal history of sudden cardiac arrest: Secondary | ICD-10-CM | POA: Diagnosis not present

## 2021-04-27 DIAGNOSIS — R059 Cough, unspecified: Secondary | ICD-10-CM | POA: Insufficient documentation

## 2021-04-27 DIAGNOSIS — Z20822 Contact with and (suspected) exposure to covid-19: Secondary | ICD-10-CM | POA: Diagnosis not present

## 2021-04-27 DIAGNOSIS — I428 Other cardiomyopathies: Secondary | ICD-10-CM | POA: Diagnosis present

## 2021-04-27 DIAGNOSIS — I469 Cardiac arrest, cause unspecified: Secondary | ICD-10-CM

## 2021-04-27 DIAGNOSIS — I4901 Ventricular fibrillation: Secondary | ICD-10-CM | POA: Diagnosis not present

## 2021-04-27 DIAGNOSIS — Z79899 Other long term (current) drug therapy: Secondary | ICD-10-CM | POA: Diagnosis not present

## 2021-04-27 DIAGNOSIS — Z539 Procedure and treatment not carried out, unspecified reason: Secondary | ICD-10-CM | POA: Insufficient documentation

## 2021-04-27 DIAGNOSIS — I10 Essential (primary) hypertension: Secondary | ICD-10-CM | POA: Diagnosis not present

## 2021-04-27 DIAGNOSIS — I480 Paroxysmal atrial fibrillation: Secondary | ICD-10-CM | POA: Insufficient documentation

## 2021-04-27 LAB — GLUCOSE, CAPILLARY: Glucose-Capillary: 179 mg/dL — ABNORMAL HIGH (ref 70–99)

## 2021-04-27 LAB — SARS CORONAVIRUS 2 BY RT PCR (HOSPITAL ORDER, PERFORMED IN ~~LOC~~ HOSPITAL LAB): SARS Coronavirus 2: NEGATIVE

## 2021-04-27 SURGERY — SUBQ ICD IMPLANT
Anesthesia: General

## 2021-04-27 MED ORDER — SODIUM CHLORIDE 0.9 % IV SOLN: INTRAVENOUS | Status: DC

## 2021-04-27 MED ORDER — SODIUM CHLORIDE 0.9 % IV SOLN: 80.0000 mg | INTRAVENOUS | Status: DC

## 2021-04-27 MED ORDER — CEFAZOLIN SODIUM-DEXTROSE 2-4 GM/100ML-% IV SOLN: 2.0000 g | INTRAVENOUS | Status: DC

## 2021-04-27 MED ORDER — CHLORHEXIDINE GLUCONATE 4 % EX LIQD: 4.0000 "application " | Freq: Once | CUTANEOUS | Status: DC

## 2021-04-27 NOTE — Anesthesia Preprocedure Evaluation (Signed)
Anesthesia Evaluation    Airway        Dental   Pulmonary Patient abstained from smoking.,           Cardiovascular hypertension,      Neuro/Psych    GI/Hepatic   Endo/Other  diabetes  Renal/GU      Musculoskeletal   Abdominal   Peds  Hematology   Anesthesia Other Findings   Reproductive/Obstetrics                             Anesthesia Physical Anesthesia Plan  ASA:   Anesthesia Plan:    Post-op Pain Management:    Induction:   PONV Risk Score and Plan:   Airway Management Planned:   Additional Equipment:   Intra-op Plan:   Post-operative Plan:   Informed Consent:   Plan Discussed with:   Anesthesia Plan Comments: (Patient here with bilateral diffuse wheezing and rhonchi on auscultation. + cough that is occasional productive. Patient is using albuterol several times throughout every day.  Discussed with Camnitz who stated procedure is not emergent/urgent and can be delayed until patient is optimized from a pulmonary standpoint. Patient is aware. The CV coordinator is going to assist with getting her follow-up with her PCP or a pulmonary MD. Will also test patient for COVID. Norton Blizzard, MD )        Anesthesia Quick Evaluation

## 2021-04-27 NOTE — Interval H&P Note (Signed)
History and Physical Interval Note:  04/27/2021 10:26 AM  Courtney Davies  has presented today for surgery, with the diagnosis of cardiomyopathy.  The various methods of treatment have been discussed with the patient and family. After consideration of risks, benefits and other options for treatment, the patient has consented to  Procedure(s): SUBQ ICD IMPLANT (N/A) as a surgical intervention.  The patient's history has been reviewed, patient examined, no change in status, stable for surgery.  I have reviewed the patient's chart and labs.  Questions were answered to the patient's satisfaction.     Courtney Davies  ICD Criteria  Current LVEF:53%. Within 12 months prior to implant: Yes   Heart failure history: No  Cardiomyopathy history: No.  Atrial Fibrillation/Atrial Flutter: No.  Ventricular tachycardia history: Yes, No hemodynamic instability. VT Type is unknown.  Cardiac arrest history: Yes, Ventricular Fibrillation.  History of syndromes with risk of sudden death: No.  Previous ICD: No.  Current ICD indication: Primary  PPM indication: No.  Class I or II Bradycardia indication present: No  Beta Blocker therapy for 3 or more months: Yes, prescribed.   Ace Inhibitor/ARB therapy for 3 or more months: Yes, prescribed.    I have seen Courtney Davies is a 46 y.o. femalepre-procedural and has been referred by [place] for consideration of ICD implant for secondary prevention of sudden death.  The patient's chart has been reviewed and they meet criteria for ICD implant.  I have had a thorough discussion with the patient reviewing options.  The patient and their family (if available) have had opportunities to ask questions and have them answered. The patient and I have decided together through the [employer] to implant ICD at this time.  Risks, benefits, alternatives to ICD implantation were discussed in detail with the patient today. The patient   understands that the risks include but are not limited to bleeding, infection, pneumothorax, perforation, tamponade, vascular damage, renal failure, MI, stroke, death, inappropriate shocks, and lead dislodgement and wishes to proceed.

## 2021-04-27 NOTE — Progress Notes (Signed)
Patient arrived for Sub Q ICD.  She is coughing, nauseated.  Dr Curt Bears and anesthesia agree that patient's procedure is not an emergency and she should see primary care or pulmonologist.  [facility] to get patient appt.  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  A new appt has been scheduled on January 27 @ 12:00.  Dr Curt Bears will follow up with patient after pulmonary visit.

## 2021-04-27 NOTE — Progress Notes (Signed)
Pt arrived to unit.  Covid test obtained.  Pt SOB and coughing and states she cannot stop coughing without using her inhaler  and drinking water.  Pt states she cannot lye down without coughing and has not slept well in months because of coughing.  Dr Curt Bears and Dr Elgie Congo from anesthesia notified and both came to assess pt.

## 2021-04-28 ENCOUNTER — Other Ambulatory Visit (HOSPITAL_COMMUNITY): Payer: Self-pay

## 2021-05-03 ENCOUNTER — Encounter: Payer: Self-pay | Admitting: Physical Medicine & Rehabilitation

## 2021-05-03 ENCOUNTER — Encounter: Payer: Medicaid Other | Attending: Physical Medicine & Rehabilitation | Admitting: Physical Medicine & Rehabilitation

## 2021-05-03 ENCOUNTER — Other Ambulatory Visit: Payer: Self-pay

## 2021-05-03 VITALS — BP 151/85 | HR 67 | Ht 66.0 in | Wt 230.0 lb

## 2021-05-03 DIAGNOSIS — G931 Anoxic brain damage, not elsewhere classified: Secondary | ICD-10-CM | POA: Diagnosis present

## 2021-05-03 NOTE — Patient Instructions (Addendum)
[facility]  Social services organization in [place], [place] Address: [address] [place], [place] [address]   Phone: [phone]     REGARDING BLOOD SUGARS, I'M HAPPY TO TRY SOME ORAL MEDICATIONS ONCE YOUR ICD IS IN PLACE AND THINGS HAVE SETTLED.     STOP DEPAKOTE   AT HOME, WORK ON A REGULAR ROUTINE. USE A CALENDAR AND ALARM AS REMINDERS. TAKE NOTES. WRITE THINGS DOWN SO THAT YOU CAN REFER TO THEM

## 2021-05-03 NOTE — Progress Notes (Signed)
Subjective:    Patient ID: Courtney Davies, female    DOB: July 09, 1975, 46 y.o.   MRN: [id number]  HPI Courtney Davies is here in follow-up of her anoxic brain injury.  Apparently she had an ICD D placement scheduled but then she presented with significant cough and the procedure was delayed.  She tells me she has a pulmonary visit scheduled for next week.  I have asked her what she has been doing since we last met and she states that she is at home a lot.  We talked about her being at home by herself intermittently but I am not sure that happened just yet.  She had questions about her blood sugars today and the use of insulin.  She reports ongoing problems with her memory.  She does feel depressed at times when she thinks about her deficits and the things that she cannot do such as work and driving a car.  She states that she is sleeping fairly well.  She came off Seroquel.  She is only taking Depakote once a day.  She still takes Celexa 20 mg at bedtime.  Pain Inventory Average Pain 10 Pain Right Now 10 My pain is constant, sharp, burning, dull, stabbing, tingling, and aching  LOCATION OF PAIN  Right leg  BOWEL Number of stools per week: 14 or more Oral laxative use Yes  Type of laxative Linzess   BLADDER Normal   Mobility ability to climb steps?  yes do you drive?  yes  Function what is your job? Currently not working  Neuro/Psych depression suicidal thoughts  Prior Studies Any changes since last visit?  no  Physicians involved in your care Any changes since last visit?  no   Family History  Problem Relation Age of Onset   Stroke Mother    Diabetes Mother    Diabetes Father    Stroke Maternal Grandmother    Diabetes Maternal Grandmother    Anemia Paternal Grandmother    Valvular heart disease Paternal Grandmother    Hypertension Mother    Prostate cancer Maternal Uncle        ? intestinal also   Social History   Socioeconomic History   Marital status: Single     Spouse name: Not on file   Number of children: 2   Years of education: 11th   Highest education level: Not on file  Occupational History   Occupation: salad maker in restaurant  Tobacco Use   Smoking status: Unknown   Smokeless tobacco: Never  Vaping Use   Vaping Use: Never used  Substance and Sexual Activity   Alcohol use: Not Currently    Comment: occ   Drug use: Yes    Types: Marijuana   Sexual activity: Not Currently    Birth control/protection: Surgical  Other Topics Concern   Not on file  Social History Narrative   ** Merged History Encounter **       Lives at home with her two children. Occasional caffeine use. Right-handed.   Social Determinants of Health   Financial Resource Strain: Not on file  Food Insecurity: Not on file  Transportation Needs: Not on file  Physical Activity: Not on file  Stress: Not on file  Social Connections: Not on file   Past Surgical History:  Procedure Laterality Date   [place]; 2002   ESOPHAGEAL DILATION  02/2020   by Dr Patrcia Dolly HERNIA  REPAIR Bilateral 1980s    "total of 4 surgeries" (04/23/2017)   IR GASTROSTOMY TUBE MOD SED  12/13/2020   KNEE ARTHROSCOPY WITH MEDIAL MENISECTOMY Left 01/30/2018   Procedure: LEFT KNEE ARTHROSCOPY WITH PARTIAL LATERAL MENISCECTOMY;  Surgeon: Mcarthur Rossetti, MD;  Location: [facility];  Service: Orthopedics;  Laterality: Left;   KNEE CARTILAGE SURGERY Left    TUBAL LIGATION  2002   Past Medical History:  Diagnosis Date   Anemia 09/2015   Anemia    Arthritis    "knees" (04/23/2017)   Asthma    "teens; went away; came back" (04/23/2017)   B12 deficiency anemia 04/23/2017   Chronic bronchitis (HCC)    Chronic low back pain with sciatica    Chronic lower back pain    Diabetes mellitus without complication ([facility])    Diabetes type 2, controlled ([facility])    Elevated ferritin level    Fatty liver    GERD (gastroesophageal  reflux disease)    GERD with stricture    Headache    "1-2/wk" (04/23/2017)   History of blood transfusion "plenty"   "related to anemia" (04/23/2017)   Hypertension    Inguinal hernia    Low back pain    Migraine    "1-2/month" (04/23/2017)   OA (osteoarthritis) of knee--left    Symptomatic anemia 04/23/2017   Vitamin B 12 deficiency    Pulse 67    Ht 5' 6"  (1.676 m)    Wt 230 lb (104.3 kg)    LMP 12/03/2012    SpO2 98%    BMI 37.12 kg/m   Opioid Risk Score:   Fall Risk Score:  `1  Depression screen PHQ 2/9  Depression screen PHQ 2/9 03/08/2021  Decreased Interest 0  Down, Depressed, Hopeless 1  PHQ - 2 Score 1  Altered sleeping 1  Tired, decreased energy 0  Change in appetite 0  Feeling bad or failure about yourself  2  Trouble concentrating 0  Moving slowly or fidgety/restless 0  Suicidal thoughts 0  PHQ-9 Score 4  Difficult doing work/chores Not difficult at all    Review of Systems  Musculoskeletal:        Right leg pain  Psychiatric/Behavioral:  Positive for suicidal ideas.   All other systems reviewed and are negative.     Objective:   Physical Exam General: No acute distress HEENT: NCAT, EOMI, oral membranes moist Cards: reg rate  Chest: normal effort Abdomen: Soft, NT, ND Skin: dry, intact Extremities: no edema Psych: Patient generally pleasant but very impulsive and restless, often just very anxious. Skin: intact Neuro: Patient is alert and oriented with extra time.  Cranial nerve exam is unremarkable.  Patient with concentration issues.  She becomes distracted easily.  Does demonstrate some insight and awareness.  Does have short-term memory deficits but but given some cues can be somewhat functional with her memory.  She is able to use her phone in a constructive manner. Musculoskeletal: Right knee tender to palpation.  Did not appear to be favoring it when she ambulated on the limb.    Assessment & Plan:  1.  Decline in ADL, mobility , and cognition  secondary to anoxic brain injury -Patient may return to her home as long as she has intermittent supervision -No Driving -Provided patient and sister with contact information regarding vocational rehab on [place] the need to use a calendar and schedule.  She will use the alarm on her phone as a reminder as well.  She needs to write things down. 2. Left knee pain: ordered xrays left knee--never done  -Now complaining of right knee pain.  Did not seem to be favoring the knee greatly today.             -May use voltaren gel right knee 4. Mood:               -off seroquel -vpa only taking daily---stop -continue celexa 15m qhs   6. T2DM per primary?:  -18 and 10 u currently  -sugars have been consistent  -consider amaryl once ICD in place 10. HTN: Monitor BP tid--continue Cozaar             Controlled  10/11 11. SVT/NSVT: On coreg and amiodarone bid for rate control.               -pt is scheduled for ICD/pacer  -on hold pending pulmonary f/u 12. Dysphagia:              -resolved   30 minutes of face to face patient care time were spent during this visit. All questions were encouraged and answered.  Follow up with me in 3 mos .

## 2021-05-12 ENCOUNTER — Other Ambulatory Visit: Payer: Self-pay

## 2021-05-12 ENCOUNTER — Encounter: Payer: Self-pay | Admitting: Pulmonary Disease

## 2021-05-12 ENCOUNTER — Ambulatory Visit (INDEPENDENT_AMBULATORY_CARE_PROVIDER_SITE_OTHER): Payer: Medicaid Other | Admitting: Pulmonary Disease

## 2021-05-12 VITALS — BP 134/82 | HR 69 | Temp 98.1°F | Ht 65.0 in | Wt 219.0 lb

## 2021-05-12 DIAGNOSIS — J4520 Mild intermittent asthma, uncomplicated: Secondary | ICD-10-CM

## 2021-05-12 DIAGNOSIS — R052 Subacute cough: Secondary | ICD-10-CM

## 2021-05-12 DIAGNOSIS — I469 Cardiac arrest, cause unspecified: Secondary | ICD-10-CM

## 2021-05-12 MED ORDER — FLUTICASONE FUROATE-VILANTEROL 200-25 MCG/ACT IN AEPB: 1.0000 | INHALATION_SPRAY | Freq: Every day | RESPIRATORY_TRACT | 0 refills | Status: DC

## 2021-05-12 NOTE — Patient Instructions (Signed)
Thank you for visiting Dr. Valeta Harms at [facility]. Today we recommend the following:  Orders Placed This Encounter  Procedures   Pulmonary Function Test   Breo Samples New breo prescription Continue albuterol as needed    Return in about 6 weeks (around 06/23/2021).    Please do your part to reduce the spread of COVID-19.

## 2021-05-12 NOTE — Progress Notes (Signed)
Synopsis: Referred in January 2023 for cough congestion, asthma by [facility]  Subjective:   PATIENT ID: Courtney Davies GENDER: female DOB: 1975-06-23, MRN: [id number]  Chief Complaint  Patient presents with   Consult    This is a 46 year old female, past medical history of asthma, GERD, hypertension, former tobacco abuse.  In August 2023 the patient suffered an out of the hospital cardiac arrest, ventricular fibrillation,Patient spent nearly 2-1/2 months in the hospital she had a brain MRI it with diffuse hypoxic ischemic injury, ultimately due to the poor neurologic recovery postarrest decision was made to extubate and see how she did.  Ultimately she had trouble swallowing with a core track and a PEG tube was placed.  She spent time in rehabilitation.  Ultimately has had a miraculous recovery.  She is fully functional able to complete all of her activities of daily living drove herself here today to the office.  Her memory is still foggy.  She still has difficulty remembering what occurred prior to her hospitalization.  She does not remember much about being in the hospital.  Overall tearful and thankful for the care she received while she was there and the fact that she is still alive today.  As for her cough she has had this for a long time.  It used to be worse when she quit smoking.  She had asthma before.  Was on inhalers.  She does notice improvement with albuterol.  No real triggers that she knows of.  Occasional allergies.   Past Medical History:  Diagnosis Date   Anemia 09/2015   Anemia    Arthritis    "knees" (04/23/2017)   Asthma    "teens; went away; came back" (04/23/2017)   B12 deficiency anemia 04/23/2017   Chronic bronchitis (HCC)    Chronic low back pain with sciatica    Chronic lower back pain    Diabetes mellitus without complication (HCC)    Diabetes type 2, controlled (HCC)    Elevated ferritin level    Fatty liver    GERD (gastroesophageal reflux  disease)    GERD with stricture    Headache    "1-2/wk" (04/23/2017)   History of blood transfusion "plenty"   "related to anemia" (04/23/2017)   Hypertension    Inguinal hernia    Low back pain    Migraine    "1-2/month" (04/23/2017)   OA (osteoarthritis) of knee--left    Symptomatic anemia 04/23/2017   Vitamin B 12 deficiency      Family History  Problem Relation Age of Onset   Stroke Mother    Diabetes Mother    Diabetes Father    Stroke Maternal Grandmother    Diabetes Maternal Grandmother    Anemia Paternal Grandmother    Valvular heart disease Paternal Grandmother    Hypertension Mother    Prostate cancer Maternal Uncle        ? intestinal also     Past Surgical History:  Procedure Laterality Date   ABDOMINAL HYSTERECTOMY  YRS AGO   COMPLETE   CESAREAN SECTION  1998; 2002   ESOPHAGEAL DILATION  02/2020   by Dr Patrcia Dolly HERNIA REPAIR Bilateral 1980s    "total of 4 surgeries" (04/23/2017)   IR GASTROSTOMY TUBE MOD SED  12/13/2020   KNEE ARTHROSCOPY WITH MEDIAL MENISECTOMY Left 01/30/2018   Procedure: LEFT KNEE ARTHROSCOPY WITH PARTIAL LATERAL MENISCECTOMY;  Surgeon: Mcarthur Rossetti, MD;  Location: [facility];  Service: Orthopedics;  Laterality: Left;   KNEE CARTILAGE SURGERY Left    TUBAL LIGATION  2002    Social History   Socioeconomic History   Marital status: Single    Spouse name: Not on file   Number of children: 2   Years of education: 11th   Highest education level: Not on file  Occupational History   Occupation: salad maker in restaurant  Tobacco Use   Smoking status: Unknown   Smokeless tobacco: Never  Vaping Use   Vaping Use: Never used  Substance and Sexual Activity   Alcohol use: Not Currently    Comment: occ   Drug use: Yes    Types: Marijuana   Sexual activity: Not Currently    Birth control/protection: Surgical  Other Topics Concern   Not on file  Social History Narrative   ** Merged History Encounter **       Lives at  home with her two children. Occasional caffeine use. Right-handed.   Social Determinants of Health   Financial Resource Strain: Not on file  Food Insecurity: Not on file  Transportation Needs: Not on file  Physical Activity: Not on file  Stress: Not on file  Social Connections: Not on file  Intimate Partner Violence: Not on file     Allergies  Allergen Reactions   Other Other (See Comments), Anaphylaxis, Swelling and Hives    Hair glue causes throat to close and hives "glue" Hair glue causes throat to close    Latex Hives   Codeine Nausea And Vomiting    Was on an empty stomach.   Latex Hives   Nitrofurantoin Rash     Outpatient Medications Prior to Visit  Medication Sig Dispense Refill   albuterol (VENTOLIN HFA) 108 (90 Base) MCG/ACT inhaler Inhale 2 puffs into the lungs every 6 (six) hours as needed for wheezing or shortness of breath. 18 g 2   amiodarone (PACERONE) 200 MG tablet Take 1 tablet (200 mg total) by mouth daily. 90 tablet 3   benzonatate (TESSALON) 100 MG capsule Take 100 mg by mouth 3 (three) times daily.     carvedilol (COREG) 6.25 MG tablet Take 1 tablet (6.25 mg total) by mouth 2 (two) times daily with a meal. 60 tablet 0   citalopram (CELEXA) 20 MG tablet Take 1 tablet (20 mg total) by mouth at bedtime. 30 tablet 0   diclofenac Sodium (VOLTAREN) 1 % GEL Apply 1 application topically 3 (three) times daily. To left knee 150 g 4   divalproex (DEPAKOTE) 250 MG DR tablet Take 2 tablets (500 mg total) by mouth 3 (three) times daily. (Patient taking differently: Take 250 mg by mouth daily.) 180 tablet 0   docusate sodium (COLACE) 100 MG capsule Take 2 capsules (200 mg total) by mouth daily. 60 capsule 0   feeding supplement (ENSURE ENLIVE / ENSURE PLUS) LIQD Take 237 mLs by mouth 3 (three) times daily between meals. 237 mL 12   insulin glargine (LANTUS) 100 UNIT/ML Solostar Pen Inject 18 Units into the skin daily AND 10 Units at bedtime. 15 mL 0   Insulin Pen  Needle (BD PEN NEEDLE NANO U/F) 32G X 4 MM MISC Use as directed twice daily. 100 each 0   levofloxacin (LEVAQUIN) 250 MG tablet Take 250 mg by mouth daily.     LINZESS 290 MCG CAPS capsule Take 290 mcg by mouth daily.     losartan (COZAAR) 25 MG tablet Take 1 tablet (25 mg total) by mouth daily. 30 tablet 0  meloxicam (MOBIC) 7.5 MG tablet Take 7.5 mg by mouth daily.     Multiple Vitamin (MULTIVITAMIN WITH MINERALS) TABS tablet Take 1 tablet by mouth daily.     nicotine (NICODERM CQ - DOSED IN MG/24 HOURS) 21 mg/24hr patch Place 1 patch (21 mg total) onto the skin daily. 28 patch 0   pantoprazole (PROTONIX) 40 MG tablet Take 1 tablet (40 mg total) by mouth 2 (two) times daily. (Patient taking differently: Take 40 mg by mouth at bedtime.) 60 tablet 0   No facility-administered medications prior to visit.    Review of Systems  Constitutional:  Negative for chills, fever, malaise/fatigue and weight loss.  HENT:  Negative for hearing loss, sore throat and tinnitus.   Eyes:  Negative for blurred vision and double vision.  Respiratory:  Positive for cough and shortness of breath. Negative for hemoptysis, sputum production, wheezing and stridor.   Cardiovascular:  Negative for chest pain, palpitations, orthopnea, leg swelling and PND.  Gastrointestinal:  Negative for abdominal pain, constipation, diarrhea, heartburn, nausea and vomiting.  Genitourinary:  Negative for dysuria, hematuria and urgency.  Musculoskeletal:  Negative for joint pain and myalgias.  Skin:  Negative for itching and rash.  Neurological:  Negative for dizziness, tingling, weakness and headaches.  Endo/Heme/Allergies:  Negative for environmental allergies. Does not bruise/bleed easily.  Psychiatric/Behavioral:  Negative for depression. The patient is not nervous/anxious and does not have insomnia.   All other systems reviewed and are negative.   Objective:  Physical Exam Vitals reviewed.  Constitutional:      General: She  is not in acute distress.    Appearance: She is well-developed. She is obese.  HENT:     Head: Normocephalic and atraumatic.  Eyes:     General: No scleral icterus.    Conjunctiva/sclera: Conjunctivae normal.     Pupils: Pupils are equal, round, and reactive to light.  Neck:     Vascular: No JVD.     Trachea: No tracheal deviation.  Cardiovascular:     Rate and Rhythm: Normal rate and regular rhythm.     Heart sounds: Normal heart sounds. No murmur heard. Pulmonary:     Effort: Pulmonary effort is normal. No tachypnea, accessory muscle usage or respiratory distress.     Breath sounds: No stridor. No wheezing, rhonchi or rales.  Abdominal:     General: There is no distension.     Palpations: Abdomen is soft.     Tenderness: There is no abdominal tenderness.     Comments: Obese pannus  Musculoskeletal:        General: No tenderness.     Cervical back: Neck supple.  Lymphadenopathy:     Cervical: No cervical adenopathy.  Skin:    General: Skin is warm and dry.     Capillary Refill: Capillary refill takes less than 2 seconds.     Findings: No rash.  Neurological:     Mental Status: She is alert and oriented to person, place, and time.  Psychiatric:        Behavior: Behavior normal.     Vitals:   05/12/21 1158  BP: 134/82  Pulse: 69  Temp: 98.1 F (36.7 C)  TempSrc: Oral  SpO2: 100%  Weight: 219 lb (99.3 kg)  Height: 5' 5"  (1.651 m)   100% on RA BMI Readings from Last 3 Encounters:  05/12/21 36.44 kg/m  05/03/21 37.12 kg/m  04/27/21 34.22 kg/m   Wt Readings from Last 3 Encounters:  05/12/21 219 lb (99.3  kg)  05/03/21 230 lb (104.3 kg)  04/27/21 212 lb (96.2 kg)     CBC    Component Value Date/Time   WBC 7.1 03/31/2021 1303   WBC 5.9 01/16/2021 1043   RBC 5.44 (H) 03/31/2021 1303   RBC 5.01 01/16/2021 1043   HGB 12.4 03/31/2021 1303   HCT 40.8 03/31/2021 1303   PLT 248 03/31/2021 1303   MCV 75 (L) 03/31/2021 1303   MCH 22.8 (L) 03/31/2021 1303    MCH 22.4 (L) 01/16/2021 1043   MCHC 30.4 (L) 03/31/2021 1303   MCHC 30.6 01/16/2021 1043   RDW 13.3 03/31/2021 1303   LYMPHSABS 2.3 01/16/2021 1043   LYMPHSABS 2.8 12/19/2017 0907   MONOABS 0.6 01/16/2021 1043   EOSABS 0.4 01/16/2021 1043   EOSABS 0.2 12/19/2017 0907   BASOSABS 0.0 01/16/2021 1043   BASOSABS 0.0 12/19/2017 0907    Chest Imaging:  Chest x-ray 12/18/2020: No acute abnormality. The patient's images have been independently reviewed by me.    Cardiac MRI:  IMPRESSION: 1. Normal LV size, mild hypertrophy, and low normal systolic function (EF 09%). Septal bounc 2.  Normal RV size and systolic function (EF 98%) 3.  No late gadolinium enhancement 4.  Mild mitral regurgitation (regurgitant fraction 11%)  Pulmonary Functions Testing Results: PFT Results Latest Ref Rng & Units 01/15/2018  FVC-Pre L 3.15  FVC-Predicted Pre % 96  FVC-Post L 3.19  FVC-Predicted Post % 97  Pre FEV1/FVC % % 74  Post FEV1/FCV % % 74  FEV1-Pre L 2.32  FEV1-Predicted Pre % 86  FEV1-Post L 2.37  DLCO uncorrected ml/min/mmHg 21.50  DLCO UNC% % 79  DLCO corrected ml/min/mmHg 21.77  DLCO COR %Predicted % 80  DLVA Predicted % 96  TLC L 5.02  TLC % Predicted % 93  RV % Predicted % 111    FeNO:   Pathology:   Echocardiogram:   Heart Catheterization:     Assessment & Plan:     ICD-10-CM   1. Mild intermittent asthma without complication  P38.25 Pulmonary Function Test    2. Subacute cough  R05.2     3. Cardiac arrest ([place])  I46.9       Discussion:  This is a 46 year old female, likely has mild intermittent asthma.  She had symptoms several years ago that she felt was potentially adult onset.  Not really sure if she had trouble as a kid or not.  She has some memory issues now postarrest.  She did have MRI imaging concerning for anoxic brain injury but she has had a miraculous recovery since then.  Plan: I think the next best step to evaluate her cough and asthma symptoms is to  have repeat PFTs.  She had PFTs done several years ago. Also put her on a maintenance inhaler. She has already quit smoking which did help her symptoms. Trial of Breo 200 Continue albuterol as needed. Follow-up with [place] in 6 weeks after PFTs complete.    Current Outpatient Medications:    albuterol (VENTOLIN HFA) 108 (90 Base) MCG/ACT inhaler, Inhale 2 puffs into the lungs every 6 (six) hours as needed for wheezing or shortness of breath., Disp: 18 g, Rfl: 2   amiodarone (PACERONE) 200 MG tablet, Take 1 tablet (200 mg total) by mouth daily., Disp: 90 tablet, Rfl: 3   benzonatate (TESSALON) 100 MG capsule, Take 100 mg by mouth 3 (three) times daily., Disp: , Rfl:    carvedilol (COREG) 6.25 MG tablet, Take 1 tablet (6.25  mg total) by mouth 2 (two) times daily with a meal., Disp: 60 tablet, Rfl: 0   citalopram (CELEXA) 20 MG tablet, Take 1 tablet (20 mg total) by mouth at bedtime., Disp: 30 tablet, Rfl: 0   diclofenac Sodium (VOLTAREN) 1 % GEL, Apply 1 application topically 3 (three) times daily. To left knee, Disp: 150 g, Rfl: 4   divalproex (DEPAKOTE) 250 MG DR tablet, Take 2 tablets (500 mg total) by mouth 3 (three) times daily. (Patient taking differently: Take 250 mg by mouth daily.), Disp: 180 tablet, Rfl: 0   docusate sodium (COLACE) 100 MG capsule, Take 2 capsules (200 mg total) by mouth daily., Disp: 60 capsule, Rfl: 0   feeding supplement (ENSURE ENLIVE / ENSURE PLUS) LIQD, Take 237 mLs by mouth 3 (three) times daily between meals., Disp: 237 mL, Rfl: 12   insulin glargine (LANTUS) 100 UNIT/ML Solostar Pen, Inject 18 Units into the skin daily AND 10 Units at bedtime., Disp: 15 mL, Rfl: 0   Insulin Pen Needle (BD PEN NEEDLE NANO U/F) 32G X 4 MM MISC, Use as directed twice daily., Disp: 100 each, Rfl: 0   levofloxacin (LEVAQUIN) 250 MG tablet, Take 250 mg by mouth daily., Disp: , Rfl:    LINZESS 290 MCG CAPS capsule, Take 290 mcg by mouth daily., Disp: , Rfl:    losartan (COZAAR) 25 MG  tablet, Take 1 tablet (25 mg total) by mouth daily., Disp: 30 tablet, Rfl: 0   meloxicam (MOBIC) 7.5 MG tablet, Take 7.5 mg by mouth daily., Disp: , Rfl:    Multiple Vitamin (MULTIVITAMIN WITH MINERALS) TABS tablet, Take 1 tablet by mouth daily., Disp: , Rfl:    nicotine (NICODERM CQ - DOSED IN MG/24 HOURS) 21 mg/24hr patch, Place 1 patch (21 mg total) onto the skin daily., Disp: 28 patch, Rfl: 0   pantoprazole (PROTONIX) 40 MG tablet, Take 1 tablet (40 mg total) by mouth 2 (two) times daily. (Patient taking differently: Take 40 mg by mouth at bedtime.), Disp: 60 tablet, Rfl: 0   Garner Nash, DO  Pulmonary Critical Care 05/12/2021 12:06 PM

## 2021-05-15 ENCOUNTER — Other Ambulatory Visit (HOSPITAL_COMMUNITY): Payer: Self-pay

## 2021-05-15 ENCOUNTER — Telehealth: Payer: Self-pay | Admitting: Pharmacy Technician

## 2021-05-15 NOTE — Telephone Encounter (Signed)
Patient Advocate Encounter  Received notification from [employer] that prior authorization for Fluticasone Furoate-Vilanterol 200-25MCG/ACT is required.   PA NOT SUBMITTED on 1.30.23 [place]  [id number] REQUIRES STEP THERAPY  Status is pending   [facility] will continue to follow  Luciano Cutter, CPhT Patient Advocate Phone: [phone] Fax:  [phone]

## 2021-05-18 ENCOUNTER — Other Ambulatory Visit (HOSPITAL_COMMUNITY): Payer: Self-pay

## 2021-05-24 ENCOUNTER — Ambulatory Visit: Payer: Medicaid Other | Admitting: Physical Medicine & Rehabilitation

## 2021-05-29 ENCOUNTER — Telehealth: Payer: Self-pay | Admitting: Cardiology

## 2021-05-29 NOTE — Telephone Encounter (Signed)
New Message:      Patient says she is ready to schedule her procedure.

## 2021-05-29 NOTE — Telephone Encounter (Addendum)
Pt aware I will be in touch to get her rescheduled. Aware MD was awaiting pulmonology visit/notes from 1/27 before making decision of next steps. Understands will need to discuss date options w/ MD who is currently out for next couple of weeks. Pt aware it may be couple weeks, but would call her soon. Patient verbalized understanding and agreeable to plan.

## 2021-05-29 NOTE — Telephone Encounter (Signed)
Patient returning call.

## 2021-05-29 NOTE — Telephone Encounter (Signed)
Left message to call back  

## 2021-06-05 ENCOUNTER — Telehealth: Payer: Self-pay

## 2021-06-05 NOTE — Telephone Encounter (Signed)
(  3:47 pm) PC SW schedule a palliative care follow-up visit for patient with Dr. Mariea Clonts. Visit scheduled for 07/04/21 @ 9 am.

## 2021-06-19 ENCOUNTER — Other Ambulatory Visit: Payer: Self-pay

## 2021-06-19 ENCOUNTER — Emergency Department (HOSPITAL_COMMUNITY): Payer: Medicaid Other

## 2021-06-19 ENCOUNTER — Emergency Department (HOSPITAL_COMMUNITY)
Admission: EM | Admit: 2021-06-19 | Discharge: 2021-06-19 | Disposition: A | Discharge status: Home / Self Care | Payer: Medicaid Other | Attending: Emergency Medicine | Admitting: Emergency Medicine

## 2021-06-19 ENCOUNTER — Encounter (HOSPITAL_COMMUNITY): Payer: Self-pay

## 2021-06-19 DIAGNOSIS — R0789 Other chest pain: Secondary | ICD-10-CM | POA: Diagnosis not present

## 2021-06-19 DIAGNOSIS — I10 Essential (primary) hypertension: Secondary | ICD-10-CM | POA: Insufficient documentation

## 2021-06-19 DIAGNOSIS — Z91040 Latex allergy status: Secondary | ICD-10-CM | POA: Diagnosis not present

## 2021-06-19 DIAGNOSIS — E1165 Type 2 diabetes mellitus with hyperglycemia: Secondary | ICD-10-CM | POA: Insufficient documentation

## 2021-06-19 DIAGNOSIS — R45851 Suicidal ideations: Secondary | ICD-10-CM | POA: Insufficient documentation

## 2021-06-19 DIAGNOSIS — N9489 Other specified conditions associated with female genital organs and menstrual cycle: Secondary | ICD-10-CM | POA: Insufficient documentation

## 2021-06-19 DIAGNOSIS — R1084 Generalized abdominal pain: Secondary | ICD-10-CM | POA: Diagnosis not present

## 2021-06-19 DIAGNOSIS — Z20822 Contact with and (suspected) exposure to covid-19: Secondary | ICD-10-CM | POA: Insufficient documentation

## 2021-06-19 DIAGNOSIS — Z79899 Other long term (current) drug therapy: Secondary | ICD-10-CM | POA: Diagnosis not present

## 2021-06-19 DIAGNOSIS — R079 Chest pain, unspecified: Secondary | ICD-10-CM

## 2021-06-19 DIAGNOSIS — Z794 Long term (current) use of insulin: Secondary | ICD-10-CM | POA: Insufficient documentation

## 2021-06-19 DIAGNOSIS — F32A Depression, unspecified: Secondary | ICD-10-CM | POA: Diagnosis not present

## 2021-06-19 DIAGNOSIS — R519 Headache, unspecified: Secondary | ICD-10-CM

## 2021-06-19 LAB — HEPATIC FUNCTION PANEL
ALT: 18 U/L (ref 0–44)
AST: 15 U/L (ref 15–41)
Albumin: 3.9 g/dL (ref 3.5–5.0)
Alkaline Phosphatase: 111 U/L (ref 38–126)
Bilirubin, Direct: 0.1 mg/dL (ref 0.0–0.2)
Indirect Bilirubin: 0.6 mg/dL (ref 0.3–0.9)
Total Bilirubin: 0.7 mg/dL (ref 0.3–1.2)
Total Protein: 7.8 g/dL (ref 6.5–8.1)

## 2021-06-19 LAB — CBC
HCT: 44.6 % (ref 36.0–46.0)
Hemoglobin: 14.1 g/dL (ref 12.0–15.0)
MCH: 22.9 pg — ABNORMAL LOW (ref 26.0–34.0)
MCHC: 31.6 g/dL (ref 30.0–36.0)
MCV: 72.4 fL — ABNORMAL LOW (ref 80.0–100.0)
Platelets: 237 10*3/uL (ref 150–400)
RBC: 6.16 MIL/uL — ABNORMAL HIGH (ref 3.87–5.11)
RDW: 15.2 % (ref 11.5–15.5)
WBC: 6.5 10*3/uL (ref 4.0–10.5)
nRBC: 0 % (ref 0.0–0.2)

## 2021-06-19 LAB — URINALYSIS, ROUTINE W REFLEX MICROSCOPIC
Bilirubin Urine: NEGATIVE
Glucose, UA: >=500 mg/dL — AB
Ketones, ur: NEGATIVE mg/dL
Leukocytes,Ua: NEGATIVE
Nitrite: NEGATIVE
Protein, ur: NEGATIVE mg/dL
Specific Gravity, Urine: 1.01 (ref 1.005–1.030)
pH: 6 (ref 5.0–8.0)

## 2021-06-19 LAB — BASIC METABOLIC PANEL
Anion gap: 10 (ref 5–15)
BUN: 12 mg/dL (ref 6–20)
CO2: 22 mmol/L (ref 22–32)
Calcium: 9.2 mg/dL (ref 8.9–10.3)
Chloride: 101 mmol/L (ref 98–111)
Creatinine, Ser: 0.79 mg/dL (ref 0.44–1.00)
GFR, Estimated: >60 mL/min (ref >60)
Glucose, Bld: 287 mg/dL — ABNORMAL HIGH (ref 70–99)
Potassium: 3.9 mmol/L (ref 3.5–5.1)
Sodium: 133 mmol/L — ABNORMAL LOW (ref 135–145)

## 2021-06-19 LAB — I-STAT BETA HCG BLOOD, ED (MC, WL, AP ONLY): I-stat hCG, quantitative: <5 m[IU]/mL (ref <5)

## 2021-06-19 LAB — TROPONIN I (HIGH SENSITIVITY)
Troponin I (High Sensitivity): 4 ng/L (ref <18)
Troponin I (High Sensitivity): 3 ng/L (ref <18)

## 2021-06-19 LAB — RESP PANEL BY RT-PCR (FLU A&B, COVID) ARPGX2
Influenza A by PCR: NEGATIVE
Influenza B by PCR: NEGATIVE
SARS Coronavirus 2 by RT PCR: NEGATIVE

## 2021-06-19 LAB — LIPASE, BLOOD: Lipase: 33 U/L (ref 11–51)

## 2021-06-19 MED ORDER — METOCLOPRAMIDE HCL 5 MG/ML IJ SOLN
10.0000 mg | Freq: Once | INTRAMUSCULAR | Status: AC
  Administered 2021-06-19: 10 mg via INTRAVENOUS
  Filled 2021-06-19: qty 2

## 2021-06-19 MED ORDER — CARVEDILOL 3.125 MG PO TABS
6.2500 mg | ORAL_TABLET | Freq: Once | ORAL | Status: AC
Start: 2021-06-19 — End: 2021-06-19
  Administered 2021-06-19: 6.25 mg via ORAL
  Filled 2021-06-19: qty 2

## 2021-06-19 MED ORDER — LOSARTAN POTASSIUM 50 MG PO TABS
25.0000 mg | ORAL_TABLET | Freq: Once | ORAL | Status: AC
  Administered 2021-06-19: 25 mg via ORAL
  Filled 2021-06-19: qty 1

## 2021-06-19 MED ORDER — SODIUM CHLORIDE 0.9 % IV BOLUS
500.0000 mL | Freq: Once | INTRAVENOUS | Status: AC
  Administered 2021-06-19: 500 mL via INTRAVENOUS

## 2021-06-19 MED ORDER — FENTANYL CITRATE PF 50 MCG/ML IJ SOSY
50.0000 ug | PREFILLED_SYRINGE | Freq: Once | INTRAMUSCULAR | Status: AC
  Administered 2021-06-19: 50 ug via INTRAVENOUS
  Filled 2021-06-19: qty 1

## 2021-06-19 MED ORDER — ACETAMINOPHEN 500 MG PO TABS
1000.0000 mg | ORAL_TABLET | Freq: Once | ORAL | Status: AC
  Administered 2021-06-19: 1000 mg via ORAL
  Filled 2021-06-19: qty 2

## 2021-06-19 MED ORDER — KETOROLAC TROMETHAMINE 15 MG/ML IJ SOLN
15.0000 mg | Freq: Once | INTRAMUSCULAR | Status: AC
Start: 2021-06-19 — End: 2021-06-19
  Administered 2021-06-19: 15 mg via INTRAVENOUS
  Filled 2021-06-19: qty 1

## 2021-06-19 MED ORDER — HYDROMORPHONE HCL 1 MG/ML IJ SOLN: 1.0000 mg | Freq: Once | INTRAMUSCULAR | Status: DC

## 2021-06-19 MED ORDER — IOHEXOL 300 MG/ML  SOLN
100.0000 mL | Freq: Once | INTRAMUSCULAR | Status: AC | PRN
  Administered 2021-06-19: 100 mL via INTRAVENOUS

## 2021-06-19 NOTE — ED Provider Notes (Signed)
[facility] EMERGENCY DEPARTMENT Provider Note   CSN: [id number] Arrival date & time: 06/19/21  4098     History  Chief Complaint  Patient presents with   Chest Pain   Shortness of Breath    Courtney Davies is a 46 y.o. female.  HPI 46 year old female with a history of chronic bronchitis, GERD, diabetes, hypertension, migraines, and recent cardiac arrest in 2022 presents with chest pain as a chief complaint.  She endorses about 3 days of on and off chest pain.  Feels like a tightness and pressure in the middle of her chest.  It is hurting worse when she touches it and her left arm hurts worse when she moves it as well.  She also endorses abdominal pain when asked though this is not immediately a part of her chief complaint.  Has severe pain.  She is also dealing with a headache that she states started after she got to the emergency department. Nausea but no vomiting.  All of the symptoms seem to get worse around 3 AM or so and she had very poor sleep due to these pains.  No back pain.  No leg swelling that she is aware of.  She been having some memory issues ever since her cardiac arrest but does not think she has had any recent travel.  She is tearful and when she was in triage she did endorse some suicidal thoughts/depression.  She states has been depressed ever since her incident in fall 2022.  She has had fleeting thoughts of suicide, but she states she would never do it and that is not an active problem right now.  She states her home situation contributes to a lot of her depressive symptoms.  Home Medications Prior to Admission medications   Medication Sig Start Date End Date Taking? Authorizing Provider  albuterol (VENTOLIN HFA) 108 (90 Base) MCG/ACT inhaler Inhale 2 puffs into the lungs every 6 (six) hours as needed for wheezing or shortness of breath. 01/24/21   Love, Ivan Anchors, PA-C  amiodarone (PACERONE) 200 MG tablet Take 1 tablet (200 mg total) by mouth daily.  01/25/21   Jettie Booze, MD  benzonatate (TESSALON) 100 MG capsule Take 100 mg by mouth 3 (three) times daily. 03/28/21   [provider]  carvedilol (COREG) 6.25 MG tablet Take 1 tablet (6.25 mg total) by mouth 2 (two) times daily with a meal. 01/24/21   Love, Ivan Anchors, PA-C  citalopram (CELEXA) 20 MG tablet Take 1 tablet (20 mg total) by mouth at bedtime. 01/24/21   Love, Ivan Anchors, PA-C  diclofenac Sodium (VOLTAREN) 1 % GEL Apply 1 application topically 3 (three) times daily. To left knee 03/08/21   Meredith Staggers, MD  divalproex (DEPAKOTE) 250 MG DR tablet Take 2 tablets (500 mg total) by mouth 3 (three) times daily. Patient taking differently: Take 250 mg by mouth daily. 03/08/21   Meredith Staggers, MD  docusate sodium (COLACE) 100 MG capsule Take 2 capsules (200 mg total) by mouth daily. 01/24/21   Love, Ivan Anchors, PA-C  feeding supplement (ENSURE ENLIVE / ENSURE PLUS) LIQD Take 237 mLs by mouth 3 (three) times daily between meals. 01/02/21   Alma Friendly, MD  fluticasone furoate-vilanterol (BREO ELLIPTA) 200-25 MCG/ACT AEPB Inhale 1 puff into the lungs daily. 05/12/21   Icard, Octavio Graves, DO  insulin glargine (LANTUS) 100 UNIT/ML Solostar Pen Inject 18 Units into the skin daily AND 10 Units at bedtime. 01/24/21  Love, Pamela S, PA-C  Insulin Pen Needle (BD PEN NEEDLE NANO U/F) 32G X 4 MM MISC Use as directed twice daily. 01/24/21   Love, Ivan Anchors, PA-C  levofloxacin (LEVAQUIN) 250 MG tablet Take 250 mg by mouth daily. 03/22/21   [provider]  LINZESS 290 MCG CAPS capsule Take 290 mcg by mouth daily. 03/01/21   [provider]  losartan (COZAAR) 25 MG tablet Take 1 tablet (25 mg total) by mouth daily. 01/24/21   Love, Ivan Anchors, PA-C  meloxicam (MOBIC) 7.5 MG tablet Take 7.5 mg by mouth daily. 04/21/21   [provider]  Multiple Vitamin (MULTIVITAMIN WITH MINERALS) TABS tablet Take 1 tablet by mouth daily. 01/03/21   Alma Friendly, MD   nicotine (NICODERM CQ - DOSED IN MG/24 HOURS) 21 mg/24hr patch Place 1 patch (21 mg total) onto the skin daily. 01/03/21   Alma Friendly, MD  pantoprazole (PROTONIX) 40 MG tablet Take 1 tablet (40 mg total) by mouth 2 (two) times daily. Patient taking differently: Take 40 mg by mouth at bedtime. 01/24/21   Love, Ivan Anchors, PA-C      Allergies    Other, Latex, Codeine, Latex, and Nitrofurantoin    Review of Systems   Review of Systems  Constitutional:  Negative for fever.  Respiratory:  Negative for cough and shortness of breath.   Cardiovascular:  Positive for chest pain.  Gastrointestinal:  Positive for abdominal pain.  Musculoskeletal:  Negative for back pain.  Neurological:  Positive for headaches.  Psychiatric/Behavioral:  Positive for dysphoric mood. Negative for suicidal ideas.    Physical Exam Updated Vital Signs BP (!) 146/106 (BP Location: Left Arm)    Pulse 77    Temp 97.8 F (36.6 C) (Oral)    Resp 15    Ht 5' 5"  (1.651 m)    Wt 99 kg    LMP 12/03/2012    SpO2 100%    BMI 36.32 kg/m  Physical Exam Vitals and nursing note reviewed.  Constitutional:      Appearance: She is well-developed.  HENT:     Head: Normocephalic and atraumatic.  Cardiovascular:     Rate and Rhythm: Normal rate and regular rhythm.     Heart sounds: Normal heart sounds.  Pulmonary:     Effort: Pulmonary effort is normal.     Breath sounds: Normal breath sounds.  Abdominal:     Palpations: Abdomen is soft.     Tenderness: There is generalized abdominal tenderness.     Comments: Generalized tenderness, worst in RUQ  Skin:    General: Skin is warm and dry.  Neurological:     Mental Status: She is alert.  Psychiatric:        Mood and Affect: Mood is depressed.        Thought Content: Thought content does not include suicidal ideation.     Comments: tearful    ED Results / Procedures / Treatments   Labs (all labs ordered are listed, but only abnormal results are displayed) Labs  Reviewed  BASIC METABOLIC PANEL - Abnormal; Notable for the following components:      Result Value   Sodium 133 (*)    Glucose, Bld 287 (*)    All other components within normal limits  CBC - Abnormal; Notable for the following components:   RBC 6.16 (*)    MCV 72.4 (*)    MCH 22.9 (*)    All other components within normal limits  URINALYSIS,  ROUTINE W REFLEX MICROSCOPIC - Abnormal; Notable for the following components:   Color, Urine STRAW (*)    Glucose, UA >=500 (*)    Hgb urine dipstick SMALL (*)    Bacteria, UA RARE (*)    All other components within normal limits  RESP PANEL BY RT-PCR (FLU A&B, COVID) [id number]  HEPATIC FUNCTION PANEL  LIPASE, BLOOD  I-STAT BETA HCG BLOOD, ED ([facility], [facility], [facility] ONLY)  TROPONIN I (HIGH SENSITIVITY)  TROPONIN I (HIGH SENSITIVITY)    EKG EKG Interpretation  Date/Time:  Monday June 19 2021 09:26:30 EST Ventricular Rate:  78 PR Interval:  156 QRS Duration: 72 QT Interval:  370 QTC Calculation: 421 R Axis:   66 Text Interpretation: Normal sinus rhythm Anteroseptal infarct , age undetermined no acute ST/T changes Improvement in ST/T segments when compared to April 2021 Confirmed by Sherwood Gambler [phone]) on 06/19/2021 9:46:44 AM  Radiology CT Head Wo Contrast  Result Date: 06/19/2021 CLINICAL DATA:  Headache, sudden, severe EXAM: CT HEAD WITHOUT CONTRAST TECHNIQUE: Contiguous axial images were obtained from the base of the skull through the vertex without intravenous contrast. RADIATION DOSE REDUCTION: This exam was performed according to the departmental dose-optimization program which includes automated exposure control, adjustment of the mA and/or kV according to patient size and/or use of iterative reconstruction technique. COMPARISON:  12/03/2020 head CT FINDINGS: Brain: No evidence of parenchymal hemorrhage or extra-axial fluid collection. No mass lesion, mass effect, or midline shift. No CT evidence of acute infarction. Cerebral volume is age  appropriate. No ventriculomegaly. Vascular: No acute abnormality. Skull: No evidence of calvarial fracture. Sinuses/Orbits: No fluid levels. Mild mucoperiosteal thickening throughout the paranasal sinuses, similar. Other:  The mastoid air cells are unopacified. IMPRESSION: 1. No evidence of acute intracranial abnormality. 2. Mild chronic appearing paranasal sinusitis. Electronically Signed   By: Ilona Sorrel M.D.   On: 06/19/2021 11:47   CT ABDOMEN PELVIS W CONTRAST  Result Date: 06/19/2021 CLINICAL DATA:  Abdominal pain. EXAM: CT ABDOMEN AND PELVIS WITH CONTRAST TECHNIQUE: Multidetector CT imaging of the abdomen and pelvis was performed using the standard protocol following bolus administration of intravenous contrast. RADIATION DOSE REDUCTION: This exam was performed according to the departmental dose-optimization program which includes automated exposure control, adjustment of the mA and/or kV according to patient size and/or use of iterative reconstruction technique. CONTRAST:  190m OMNIPAQUE IOHEXOL 300 MG/ML  SOLN COMPARISON:  CT abdomen pelvis dated December 17, 2020. FINDINGS: Lower chest: No acute abnormality. Hepatobiliary: No focal liver abnormality is seen. No gallstones, gallbladder wall thickening, or biliary dilatation. Pancreas: Unremarkable. No pancreatic ductal dilatation or surrounding inflammatory changes. Spleen: Normal in size without focal abnormality. Adrenals/Urinary Tract: Adrenal glands are unremarkable. Two simple left renal cysts again noted. No renal calculi or hydronephrosis. Mild fullness of the right renal collecting system. Bladder is unremarkable. Stomach/Bowel: Interval gastrostomy tube removal. Small hiatal hernia. The stomach is otherwise within normal limits. No bowel wall thickening, distention, or surrounding inflammatory changes. Normal appendix. Vascular/Lymphatic: No significant vascular findings are present. No enlarged abdominal or pelvic lymph nodes. Reproductive:  Status post hysterectomy. No adnexal masses. Other: Unchanged small fat containing right paramidline infraumbilical hernia. No free fluid or pneumoperitoneum. Musculoskeletal: No acute or significant osseous findings. IMPRESSION: 1. No acute intra-abdominal process. Electronically Signed   By: WTitus DubinM.D.   On: 06/19/2021 11:52   DG Chest Port 1 View  Result Date: 06/19/2021 CLINICAL DATA:  Shortness of breath EXAM: PORTABLE CHEST 1 VIEW COMPARISON:  12/18/2020  FINDINGS: Transverse diameter of heart is increased. There are no signs of pulmonary edema or focal pulmonary consolidation. There is no pleural effusion or pneumothorax. IMPRESSION: There are no signs of pulmonary edema or new focal infiltrates. Electronically Signed   By: Elmer Picker M.D.   On: 06/19/2021 09:56    Procedures Procedures    Medications Ordered in ED Medications  HYDROmorphone (DILAUDID) injection 1 mg (0 mg Intravenous Hold 06/19/21 1202)  sodium chloride 0.9 % bolus 500 mL (0 mLs Intravenous Stopped 06/19/21 1126)  fentaNYL (SUBLIMAZE) injection 50 mcg (50 mcg Intravenous Given 06/19/21 1050)  metoCLOPramide (REGLAN) injection 10 mg (10 mg Intravenous Given 06/19/21 1205)  iohexol (OMNIPAQUE) 300 MG/ML solution 100 mL (100 mLs Intravenous Contrast Given 06/19/21 1124)  acetaminophen (TYLENOL) tablet 1,000 mg (1,000 mg Oral Given 06/19/21 1215)  ketorolac (TORADOL) 15 MG/ML injection 15 mg (15 mg Intravenous Given 06/19/21 1215)  carvedilol (COREG) tablet 6.25 mg (6.25 mg Oral Given 06/19/21 1322)  losartan (COZAAR) tablet 25 mg (25 mg Oral Given 06/19/21 1322)    ED Course/ Medical Decision Making/ A&P                           Medical Decision Making Amount and/or Complexity of Data Reviewed Labs: ordered. Radiology: ordered.  Risk OTC drugs. Prescription drug management.   Patient presents with multiple areas of pain as above.  While she did have a cardiac arrest last year, she also had a CT coronary study  that showed no CAD.  It did not seem like there was a thought that this was a cardiac ischemia event.  Troponins are negative x2 here and her ECG has been interpreted by myself and shows no acute ischemia.  Chest x-ray viewed by myself as well and shows no pneumonia, edema, etc.  However given her multiple areas of pain including abdominal pain and severe headache, head CT and abdominal/pelvis CT were obtained.  These are unremarkable.  Took multiple medicines but she is feeling better.  Given her multiple diffuse symptoms I suspect that she does not have something like subarachnoid hemorrhage, mesenteric ischemia, ACS, PE or dissection.  She is feeling a lot better and is okay with going home.  She did request a dose of her blood pressure medicine as she did not take it this morning.  Her other labs were reviewed and interpreted and are benign besides some hyperglycemia.  Of note, there is also concern about her having significant depression and may be some suicidal thoughts.  She remotes that she has had suicidal thoughts in the last several months but not actively and she feels stable going home.  She states she would not want to hurt herself.  I have recommended she get some outpatient treatment but I do not think she needs emergent psychiatric stabilization.        Final Clinical Impression(s) / ED Diagnoses Final diagnoses:  Nonspecific chest pain  Bad headache    Rx / DC Orders ED Discharge Orders     None         Sherwood Gambler, MD 06/19/21 1651

## 2021-06-19 NOTE — ED Triage Notes (Addendum)
Pt arrives POV for eval of SOB x 3 days as well as intermittent CP. Pt is tachypneic/tearful in triage. Pt w/ hx of cardiac arrest in August of last year, requiring ICD placement for Vfib arrest. Per cards note-possible preceding respiratory event. Poor historian as to this episode of pain/SOB, responds "I don't know" to any clarifying questions. Pt responds positively to suicide screening in triage stating "I'm tired of this pain", when clarified by this RN as to whether she was actively suicidal pt tearfully nods yes. When this RN started documenting that, pt states "no don't write that I don't want to".  ?

## 2021-06-19 NOTE — ED Notes (Signed)
Pt ambulatory with steady gait to restroom with stand by assist. ?

## 2021-06-19 NOTE — Discharge Instructions (Signed)
If you develop recurrent, continued, or worsening chest pain, shortness of breath, fever, vomiting, abdominal or back pain, or any other new/concerning symptoms then return to the ER for evaluation.  

## 2021-06-22 ENCOUNTER — Encounter: Payer: Self-pay | Admitting: Pulmonary Disease

## 2021-06-22 ENCOUNTER — Ambulatory Visit (INDEPENDENT_AMBULATORY_CARE_PROVIDER_SITE_OTHER): Payer: Medicaid Other | Admitting: Pulmonary Disease

## 2021-06-22 ENCOUNTER — Other Ambulatory Visit: Payer: Self-pay

## 2021-06-22 VITALS — BP 124/76 | HR 74 | Temp 98.1°F | Ht 65.0 in | Wt 230.0 lb

## 2021-06-22 DIAGNOSIS — R052 Subacute cough: Secondary | ICD-10-CM

## 2021-06-22 DIAGNOSIS — J4520 Mild intermittent asthma, uncomplicated: Secondary | ICD-10-CM | POA: Diagnosis not present

## 2021-06-22 NOTE — Patient Instructions (Addendum)
Thank you for visiting Dr. Valeta Harms at [facility]. ?Today we recommend the following: ? ?[place] application for Breo ?We will get PFTs complete prior to next office visit  ? ?Return in about 3 months (around 09/22/2021) for with APP or Dr. Valeta Harms. ? ? ? ?Please do your part to reduce the spread of COVID-19.  ? ?

## 2021-06-22 NOTE — Progress Notes (Signed)
Synopsis: Referred in January 2023 for cough congestion, asthma by [facility]  Subjective:   PATIENT ID: Courtney Davies GENDER: female DOB: February 24, 1976, MRN: [id number]  Chief Complaint  Patient presents with   Follow-up    She reports that she is doing well.     This is a 46 year old female, past medical history of asthma, GERD, hypertension, former tobacco abuse.  In August 2023 the patient suffered an out of the hospital cardiac arrest, ventricular fibrillation,Patient spent nearly 2-1/2 months in the hospital she had a brain MRI it with diffuse hypoxic ischemic injury, ultimately due to the poor neurologic recovery postarrest decision was made to extubate and see how she did.  Ultimately she had trouble swallowing with a core track and a PEG tube was placed.  She spent time in rehabilitation.  Ultimately has had a miraculous recovery.  She is fully functional able to complete all of her activities of daily living drove herself here today to the office.  Her memory is still foggy.  She still has difficulty remembering what occurred prior to her hospitalization.  She does not remember much about being in the hospital.  Overall tearful and thankful for the care she received while she was there and the fact that she is still alive today.  As for her cough she has had this for a long time.  It used to be worse when she quit smoking.  She had asthma before.  Was on inhalers.  She does notice improvement with albuterol.  No real triggers that she knows of.  Occasional allergies.  OV 06/22/2021: Here today for follow-up.  Unfortunately she did not have her pulmonary function test completed prior to today's visit however we can review the fact that she was recently started on Breo.  Her cough and shortness of breath has completely resolved since starting Breo.  Her asthma is much better controlled.   Past Medical History:  Diagnosis Date   Anemia 09/2015   Anemia    Arthritis     "knees" (04/23/2017)   Asthma    "teens; went away; came back" (04/23/2017)   B12 deficiency anemia 04/23/2017   Chronic bronchitis (HCC)    Chronic low back pain with sciatica    Chronic lower back pain    Diabetes mellitus without complication (HCC)    Diabetes type 2, controlled (HCC)    Elevated ferritin level    Fatty liver    GERD (gastroesophageal reflux disease)    GERD with stricture    Headache    "1-2/wk" (04/23/2017)   History of blood transfusion "plenty"   "related to anemia" (04/23/2017)   Hypertension    Inguinal hernia    Low back pain    Migraine    "1-2/month" (04/23/2017)   OA (osteoarthritis) of knee--left    Symptomatic anemia 04/23/2017   Vitamin B 12 deficiency      Family History  Problem Relation Age of Onset   Stroke Mother    Diabetes Mother    Diabetes Father    Stroke Maternal Grandmother    Diabetes Maternal Grandmother    Anemia Paternal Grandmother    Valvular heart disease Paternal Grandmother    Hypertension Mother    Prostate cancer Maternal Uncle        ? intestinal also     Past Surgical History:  Procedure Laterality Date   ABDOMINAL HYSTERECTOMY  YRS AGO   COMPLETE   CESAREAN SECTION  1998; 2002  ESOPHAGEAL DILATION  02/2020   by Dr Patrcia Dolly HERNIA REPAIR Bilateral 1980s    "total of 4 surgeries" (04/23/2017)   IR GASTROSTOMY TUBE MOD SED  12/13/2020   KNEE ARTHROSCOPY WITH MEDIAL MENISECTOMY Left 01/30/2018   Procedure: LEFT KNEE ARTHROSCOPY WITH PARTIAL LATERAL MENISCECTOMY;  Surgeon: Mcarthur Rossetti, MD;  Location: [facility];  Service: Orthopedics;  Laterality: Left;   KNEE CARTILAGE SURGERY Left    TUBAL LIGATION  2002    Social History   Socioeconomic History   Marital status: Single    Spouse name: Not on file   Number of children: 2   Years of education: 11th   Highest education level: Not on file  Occupational History   Occupation: salad maker in restaurant  Tobacco Use   Smoking status: Unknown    Smokeless tobacco: Never  Vaping Use   Vaping Use: Never used  Substance and Sexual Activity   Alcohol use: Not Currently    Comment: occ   Drug use: Yes    Types: Marijuana   Sexual activity: Not Currently    Birth control/protection: Surgical  Other Topics Concern   Not on file  Social History Narrative   ** Merged History Encounter **       Lives at home with her two children. Occasional caffeine use. Right-handed.   Social Determinants of Health   Financial Resource Strain: Not on file  Food Insecurity: Not on file  Transportation Needs: Not on file  Physical Activity: Not on file  Stress: Not on file  Social Connections: Not on file  Intimate Partner Violence: Not on file     Allergies  Allergen Reactions   Latex Hives   Other Other (See Comments), Anaphylaxis, Swelling and Hives    Hair glue causes throat to close and hives "glue" Hair glue causes throat to close    Latex Hives   Codeine Nausea And Vomiting    Was on an empty stomach.   Nitrofurantoin Rash     Outpatient Medications Prior to Visit  Medication Sig Dispense Refill   albuterol (VENTOLIN HFA) 108 (90 Base) MCG/ACT inhaler Inhale 2 puffs into the lungs every 6 (six) hours as needed for wheezing or shortness of breath. 18 g 2   amiodarone (PACERONE) 200 MG tablet Take 1 tablet (200 mg total) by mouth daily. 90 tablet 3   benzonatate (TESSALON) 100 MG capsule Take 100 mg by mouth 3 (three) times daily.     carvedilol (COREG) 6.25 MG tablet Take 1 tablet (6.25 mg total) by mouth 2 (two) times daily with a meal. 60 tablet 0   divalproex (DEPAKOTE) 250 MG DR tablet Take 2 tablets (500 mg total) by mouth 3 (three) times daily. (Patient taking differently: Take 250 mg by mouth daily.) 180 tablet 0   docusate sodium (COLACE) 100 MG capsule Take 2 capsules (200 mg total) by mouth daily. 60 capsule 0   fluticasone furoate-vilanterol (BREO ELLIPTA) 200-25 MCG/ACT AEPB Inhale 1 puff into the lungs daily. 60  each 0   levofloxacin (LEVAQUIN) 250 MG tablet Take 250 mg by mouth daily.     LINZESS 290 MCG CAPS capsule Take 290 mcg by mouth daily.     losartan (COZAAR) 25 MG tablet Take 1 tablet (25 mg total) by mouth daily. 30 tablet 0   meloxicam (MOBIC) 7.5 MG tablet Take 7.5 mg by mouth daily.     Multiple Vitamin (MULTIVITAMIN WITH MINERALS) TABS tablet Take 1 tablet by  mouth daily.     pantoprazole (PROTONIX) 40 MG tablet Take 1 tablet (40 mg total) by mouth 2 (two) times daily. (Patient taking differently: Take 40 mg by mouth at bedtime.) 60 tablet 0   citalopram (CELEXA) 20 MG tablet Take 1 tablet (20 mg total) by mouth at bedtime. (Patient not taking: Reported on 06/22/2021) 30 tablet 0   diclofenac Sodium (VOLTAREN) 1 % GEL Apply 1 application topically 3 (three) times daily. To left knee (Patient not taking: Reported on 06/22/2021) 150 g 4   feeding supplement (ENSURE ENLIVE / ENSURE PLUS) LIQD Take 237 mLs by mouth 3 (three) times daily between meals. (Patient not taking: Reported on 06/22/2021) 237 mL 12   insulin glargine (LANTUS) 100 UNIT/ML Solostar Pen Inject 18 Units into the skin daily AND 10 Units at bedtime. (Patient not taking: Reported on 06/22/2021) 15 mL 0   Insulin Pen Needle (BD PEN NEEDLE NANO U/F) 32G X 4 MM MISC Use as directed twice daily. (Patient not taking: Reported on 06/22/2021) 100 each 0   nicotine (NICODERM CQ - DOSED IN MG/24 HOURS) 21 mg/24hr patch Place 1 patch (21 mg total) onto the skin daily. (Patient not taking: Reported on 06/22/2021) 28 patch 0   No facility-administered medications prior to visit.    Review of Systems  Constitutional:  Negative for chills, fever, malaise/fatigue and weight loss.  HENT:  Negative for hearing loss, sore throat and tinnitus.   Eyes:  Negative for blurred vision and double vision.  Respiratory:  Positive for cough and shortness of breath. Negative for hemoptysis, sputum production, wheezing and stridor.   Cardiovascular:  Negative for  chest pain, palpitations, orthopnea, leg swelling and PND.  Gastrointestinal:  Negative for abdominal pain, constipation, diarrhea, heartburn, nausea and vomiting.  Genitourinary:  Negative for dysuria, hematuria and urgency.  Musculoskeletal:  Negative for joint pain and myalgias.  Skin:  Negative for itching and rash.  Neurological:  Negative for dizziness, tingling, weakness and headaches.  Endo/Heme/Allergies:  Negative for environmental allergies. Does not bruise/bleed easily.  Psychiatric/Behavioral:  Negative for depression. The patient is not nervous/anxious and does not have insomnia.   All other systems reviewed and are negative.   Objective:  Physical Exam Vitals reviewed.  Constitutional:      General: She is not in acute distress.    Appearance: She is well-developed. She is obese.  HENT:     Head: Normocephalic and atraumatic.  Eyes:     General: No scleral icterus.    Conjunctiva/sclera: Conjunctivae normal.     Pupils: Pupils are equal, round, and reactive to light.  Neck:     Vascular: No JVD.     Trachea: No tracheal deviation.  Cardiovascular:     Rate and Rhythm: Normal rate and regular rhythm.     Heart sounds: Normal heart sounds. No murmur heard. Pulmonary:     Effort: Pulmonary effort is normal. No tachypnea, accessory muscle usage or respiratory distress.     Breath sounds: No stridor. No wheezing, rhonchi or rales.  Abdominal:     General: There is no distension.     Palpations: Abdomen is soft.     Tenderness: There is no abdominal tenderness.  Musculoskeletal:        General: No tenderness.     Cervical back: Neck supple.  Lymphadenopathy:     Cervical: No cervical adenopathy.  Skin:    General: Skin is warm and dry.     Capillary Refill: Capillary refill takes  less than 2 seconds.     Findings: No rash.  Neurological:     Mental Status: She is alert and oriented to person, place, and time.  Psychiatric:        Behavior: Behavior normal.      Vitals:   06/22/21 1121  BP: 124/76  Pulse: 74  Temp: 98.1 F (36.7 C)  TempSrc: Oral  SpO2: 100%  Weight: 230 lb (104.3 kg)  Height: 5' 5"  (1.651 m)   100% on RA BMI Readings from Last 3 Encounters:  06/22/21 38.27 kg/m  06/19/21 36.32 kg/m  05/12/21 36.44 kg/m   Wt Readings from Last 3 Encounters:  06/22/21 230 lb (104.3 kg)  06/19/21 218 lb 4.1 oz (99 kg)  05/12/21 219 lb (99.3 kg)     CBC    Component Value Date/Time   WBC 6.5 06/19/2021 0943   RBC 6.16 (H) 06/19/2021 0943   HGB 14.1 06/19/2021 0943   HGB 12.4 03/31/2021 1303   HCT 44.6 06/19/2021 0943   HCT 40.8 03/31/2021 1303   PLT 237 06/19/2021 0943   PLT 248 03/31/2021 1303   MCV 72.4 (L) 06/19/2021 0943   MCV 75 (L) 03/31/2021 1303   MCH 22.9 (L) 06/19/2021 0943   MCHC 31.6 06/19/2021 0943   RDW 15.2 06/19/2021 0943   RDW 13.3 03/31/2021 1303   LYMPHSABS 2.3 01/16/2021 1043   LYMPHSABS 2.8 12/19/2017 0907   MONOABS 0.6 01/16/2021 1043   EOSABS 0.4 01/16/2021 1043   EOSABS 0.2 12/19/2017 0907   BASOSABS 0.0 01/16/2021 1043   BASOSABS 0.0 12/19/2017 0907    Chest Imaging:  Chest x-ray 12/18/2020: No acute abnormality. The patient's images have been independently reviewed by me.    Cardiac MRI:  IMPRESSION: 1. Normal LV size, mild hypertrophy, and low normal systolic function (EF 41%). Septal bounc 2.  Normal RV size and systolic function (EF 96%) 3.  No late gadolinium enhancement 4.  Mild mitral regurgitation (regurgitant fraction 11%)  Pulmonary Functions Testing Results: PFT Results Latest Ref Rng & Units 01/15/2018  FVC-Pre L 3.15  FVC-Predicted Pre % 96  FVC-Post L 3.19  FVC-Predicted Post % 97  Pre FEV1/FVC % % 74  Post FEV1/FCV % % 74  FEV1-Pre L 2.32  FEV1-Predicted Pre % 86  FEV1-Post L 2.37  DLCO uncorrected ml/min/mmHg 21.50  DLCO UNC% % 79  DLCO corrected ml/min/mmHg 21.77  DLCO COR %Predicted % 80  DLVA Predicted % 96  TLC L 5.02  TLC % Predicted % 93  RV  % Predicted % 111    FeNO:   Pathology:   Echocardiogram:   Heart Catheterization:     Assessment & Plan:     ICD-10-CM   1. Mild intermittent asthma without complication  Q22.97     2. Subacute cough  R05.2       Discussion:  This is a 46 year old female, mild intermittent asthma.  Doing much better on Breo Ellipta  Plan: Ensure that PFTs are scheduled. Continue maintenance inhaler with Breo. New prescription to be sent with financial aid application to help with cost of inhalers. Follow-up with [place] in 3 months.    Current Outpatient Medications:    albuterol (VENTOLIN HFA) 108 (90 Base) MCG/ACT inhaler, Inhale 2 puffs into the lungs every 6 (six) hours as needed for wheezing or shortness of breath., Disp: 18 g, Rfl: 2   amiodarone (PACERONE) 200 MG tablet, Take 1 tablet (200 mg total) by mouth daily., Disp: 90 tablet, Rfl:  3   benzonatate (TESSALON) 100 MG capsule, Take 100 mg by mouth 3 (three) times daily., Disp: , Rfl:    carvedilol (COREG) 6.25 MG tablet, Take 1 tablet (6.25 mg total) by mouth 2 (two) times daily with a meal., Disp: 60 tablet, Rfl: 0   divalproex (DEPAKOTE) 250 MG DR tablet, Take 2 tablets (500 mg total) by mouth 3 (three) times daily. (Patient taking differently: Take 250 mg by mouth daily.), Disp: 180 tablet, Rfl: 0   docusate sodium (COLACE) 100 MG capsule, Take 2 capsules (200 mg total) by mouth daily., Disp: 60 capsule, Rfl: 0   fluticasone furoate-vilanterol (BREO ELLIPTA) 200-25 MCG/ACT AEPB, Inhale 1 puff into the lungs daily., Disp: 60 each, Rfl: 0   levofloxacin (LEVAQUIN) 250 MG tablet, Take 250 mg by mouth daily., Disp: , Rfl:    LINZESS 290 MCG CAPS capsule, Take 290 mcg by mouth daily., Disp: , Rfl:    losartan (COZAAR) 25 MG tablet, Take 1 tablet (25 mg total) by mouth daily., Disp: 30 tablet, Rfl: 0   meloxicam (MOBIC) 7.5 MG tablet, Take 7.5 mg by mouth daily., Disp: , Rfl:    Multiple Vitamin (MULTIVITAMIN WITH MINERALS) TABS tablet,  Take 1 tablet by mouth daily., Disp: , Rfl:    pantoprazole (PROTONIX) 40 MG tablet, Take 1 tablet (40 mg total) by mouth 2 (two) times daily. (Patient taking differently: Take 40 mg by mouth at bedtime.), Disp: 60 tablet, Rfl: 0    Garner Nash, DO [facility] 06/22/2021 11:35 AM

## 2021-06-23 ENCOUNTER — Other Ambulatory Visit (HOSPITAL_COMMUNITY): Payer: Self-pay

## 2021-06-23 MED ORDER — SYMBICORT 160-4.5 MCG/ACT IN AERO
2.0000 | INHALATION_SPRAY | Freq: Two times a day (BID) | RESPIRATORY_TRACT | 11 refills | Status: DC
Start: 2021-06-23 — End: 2023-01-08

## 2021-06-23 NOTE — Telephone Encounter (Signed)
? ?  Dr Valeta Harms- please advise if you want to have pt try brand advair or symbicort as they have a $4 copay. Breo not covered- requires step therapy. Thanks! ? ? ?Baker, Monchell R, CPhT ?to Fran Lowes, CMA   ?  11:20 PM ?Pt insurance requires step therapy before they will pay for Breo. They will cove Brand Advair and Symbicort. They both return a copay of $4. The other medication is [facility], however pt has already tried. Please advise and send in new script to pharmacy.  ?

## 2021-06-23 NOTE — Telephone Encounter (Signed)
Courtney Nash, DO ?to Me  [facility] Triage Pool   ?  11:33 AM ?Symbicort 160 2puffs BID  ? ?Thanks  ? ?BLI  ? ?Spoke with the pt and notified of response from Dr Valeta Harms. Rx for symbicort sent to her preferred pharm. Nothing further needed.  ?

## 2021-06-27 ENCOUNTER — Ambulatory Visit: Payer: Medicaid Other | Attending: Internal Medicine

## 2021-06-27 DIAGNOSIS — Z23 Encounter for immunization: Secondary | ICD-10-CM

## 2021-06-27 NOTE — Progress Notes (Signed)
? ?  Covid-19 Vaccination [facility] ? ?Name:  Courtney Davies    ?MRN: [id number] ?DOB: 03-08-1976 ? ?06/27/2021 ? ?[name] was observed post Covid-19 immunization for 15 minutes without incident. She was provided with Vaccine Information Sheet and instruction to access the V-Safe system.  ? ?[name] was instructed to call 911 with any severe reactions post vaccine: ?Difficulty breathing  ?Swelling of face and throat  ?A fast heartbeat  ?A bad rash all over body  ?Dizziness and weakness  ? ?Immunizations Administered   ? ? Name Date Dose VIS Date Route  ? PFIZER Comrnaty(Gray TOP) Covid-19 Vaccine 06/27/2021 10:27 AM 0.3 mL 12/14/2020 Intramuscular  ? Manufacturer: [place]: [place]  ? NDC: 0069-2025-10  ? ?  ? ? ?

## 2021-07-04 ENCOUNTER — Other Ambulatory Visit: Payer: Self-pay

## 2021-07-04 ENCOUNTER — Telehealth: Payer: Self-pay | Admitting: *Deleted

## 2021-07-04 ENCOUNTER — Other Ambulatory Visit: Payer: Medicaid Other | Admitting: Internal Medicine

## 2021-07-04 ENCOUNTER — Encounter: Payer: Self-pay | Admitting: Internal Medicine

## 2021-07-04 VITALS — BP 140/82 | HR 66

## 2021-07-04 DIAGNOSIS — G931 Anoxic brain damage, not elsewhere classified: Secondary | ICD-10-CM

## 2021-07-04 DIAGNOSIS — E1165 Type 2 diabetes mellitus with hyperglycemia: Secondary | ICD-10-CM

## 2021-07-04 DIAGNOSIS — I4901 Ventricular fibrillation: Secondary | ICD-10-CM

## 2021-07-04 DIAGNOSIS — I10 Essential (primary) hypertension: Secondary | ICD-10-CM

## 2021-07-04 DIAGNOSIS — F321 Major depressive disorder, single episode, moderate: Secondary | ICD-10-CM

## 2021-07-04 MED ORDER — SERTRALINE HCL 25 MG PO TABS: 25.0000 mg | ORAL_TABLET | Freq: Every day | ORAL | 1 refills | Status: DC

## 2021-07-04 NOTE — Telephone Encounter (Signed)
Left message to call back to schedule SICD implant ?

## 2021-07-04 NOTE — Progress Notes (Signed)
[facility] Follow-Up Visit Telephone: [phone]  Fax: [phone]   Date of encounter: 07/04/21 7:57 AM PATIENT NAME: Courtney Davies [address] [place] [address]   [phone] (home)  DOB: 1976/02/08 MRN: [id number] PRIMARY CARE PROVIDER:    Center, [facility],  [place] [address] [phone]  REFERRING PROVIDER:   Center, [facility] [address] [place],  [place] [address] [phone]  RESPONSIBLE PARTY:    Contact Information     Name Relation Home Work Mobile   Johnson,[name] Sister   [phone]   Larie, Mathes Mother [phone]  [phone]   Isaza,[name] Sister [phone]  [phone]   Everton,[name] Father   [phone]        I met face to face with patient and family in her home along with her mother, [name], this am. Palliative Care was asked to follow this patient by consultation request of  Center, [place] to address advance care planning and complex medical decision making. This is follow-up visit.                                     ASSESSMENT AND PLAN / RECOMMENDATIONS:   Advance Care Planning/Goals of Care: Goals include to maximize quality of life and symptom management. Patient/health care surrogate gave his/her permission to discuss. CODE STATUS:  FULL CODE Goals at this time:  improve memory, she'd love to go back to work--does not like sitting around at home  Symptom Management/Plan 1. 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  Dispense: 30 tablet; Refill: 1  2. Anoxic brain injury ([place]) -missed her therapy appt with ST due to her memory and getting a call about it while in the car -I've asked our admin staff to find out the proper phone number for ST so that she can call back to reschedule as her memory remains a huge issue -suggested use of her phone calendar and alarms as well as wall calendar--pt's sister keeps track of everything, but it sounds like pt is getting a lot of the calls herself and does not remember -her aunt takes her to the appts -we wrote down a list of her appts all on one paper and provided the phone numbers for all of her offices -I also put the appts into her phone with alarms the day before and an hour or two prior to the appt to remind her -her mother was present and aware of what we did  3. Controlled type 2 diabetes mellitus with hyperglycemia, without long-term current use of insulin (HCC) -not on medications for this (had been when on seroquel after her admission and delirium)  4. Essential hypertension -bp elevated at the ED and again today -tried to order her a bp cuff but it says medicaid will not cover this  -asked our social worker  if we can get her one somehow  5. 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 Macky Lower office b/c she had cough and significant wheezing during the anesthesia eval -needs to reschedule this considering her prior cardiac arrest in august -number for cardiology EP office provided  Follow up Palliative Care Visit: Palliative care will continue to follow for complex medical decision making, advance care planning, and clarification of goals. Return 08/08/2021 to f/u on her depression.  This visit was coded based on medical decision making (MDM).  PPS: 50%  HOSPICE ELIGIBILITY/DIAGNOSIS:  not at this  time   Chief Complaint: Follow-up palliative visit  HISTORY OF PRESENT ILLNESS:  Courtney Davies is a 46 y.o. year old female  with h/o v-fib arrest, htn, diet-controlled DMII, anoxic brain injury,  knee OA, b12 and folate deficiency anemia, mild intermittent asthma among others seen today for palliative care follow-up in her home.  Her mother, [name], was there.    They report ongoing severe short-term memory loss.  She also has frequent tearful spells which her mother does say are not always in context.  She'd been to the ED on 3/6 with chest pain and sob, but workup was negative for cardiac etiology.  She was noted to have some suicidal ideation while there.  She then followed up with Dr. Valeta Harms about her asthma and was prescribed symbicort, but she has not used it even once (in package)--reviewed directions and proper use with her today, but will need reminding from her mom when she goes to use it.  Also reminded about rinsing her mouth after use.    She admits to sadness and does want treatment for depression.  Not clear at this time if some component of her crying is actually pseudobulbar affect after her anoxic brain injury or pure depression with anxiety and panic attacks.  BP ran high in ED.    She finally has her second covid vaccine scheduled and will still then require her booster.    She reports wanting a PCP in the [facility] system or who comes to her home as she cannot drive.  History obtained from review of EMR, discussion with primary team, and interview with family, facility staff/caregiver and/or Ms. Gotschall.  I reviewed available labs, medications, imaging, studies and related documents from the EMR.  Records reviewed and summarized above.   ROS  General: NAD EYES: denies vision changes ENMT: denies dysphagia Cardiovascular: denies chest pain (at present but just in ED on 3/6 with negative cardiac workup), denies DOE Pulmonary: denies cough, has wheezing at times, denies  increased SOB Abdomen: endorses good appetite, denies constipation, endorses continence of bowel GU: denies dysuria, endorses incontinence of urine MSK:  denies increased weakness,  no falls reported Skin: denies rashes or wounds Neurological: denies pain, denies insomnia Psych: Endorses depressed mood Heme/lymph/immuno: denies bruises, abnormal bleeding  Physical Exam: Current and past weights: 230 lbs 3/9 Constitutional: NAD General: obese  EYES: anicteric sclera, lids intact, no discharge  ENMT: intact hearing, oral mucous membranes moist, dentition intact CV: S1S2, RRR, no LE edema Pulmonary: LCTA, no increased work of breathing, no cough, room air Abdomen: intake 100%, normo-active BS + 4 quadrants, soft and non tender, no ascites GU: deferred MSK: no sarcopenia, moves all extremities, ambulatory Skin: warm and dry, no rashes or wounds on visible skin Neuro:  no generalized weakness,  significant cognitive impairment--short-term memory loss over minutes, difficulty with appts. Names, meds Psych: non-anxious affect,  A and O x 3, tearful off and on Hem/lymph/immuno: no widespread bruising  CURRENT PROBLEM LIST:  Patient Active Problem List   Diagnosis Date Noted   Cough 04/26/2021   Acute blood loss anemia    Controlled type 2 diabetes mellitus with hyperglycemia ([place])    Essential hypertension    Anoxic brain injury ([place]) 01/02/2021   Fever 12/20/2020   Abdominal wall cellulitis 12/20/2020   Dysphagia    Goals of care, counseling/discussion    Acute systolic heart failure ([place])    Encephalopathy acute    Type 1 diabetes mellitus without complication ([place]) 67/03/4579   Hypertension 12/02/2020   GERD (gastroesophageal reflux disease) 12/02/2020   Anemia 12/02/2020   Cardiac arrest ([place]) 12/01/2020   Unilateral primary osteoarthritis, left knee 12/01/2018   Lateral meniscus, posterior horn derangement, left 05/14/2018   Status post arthroscopy of left knee 05/14/2018    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/19/2017   Anemia 04/23/2017   Acute bronchitis 04/23/2017   Chronic back pain 10/03/2015   Symptomatic anemia 04/10/2014   Bilateral edema of lower extremity    Low back pain with left-sided sciatica    PAST MEDICAL HISTORY:  Active Ambulatory Problems    Diagnosis Date Noted   Symptomatic anemia 04/10/2014   Bilateral edema of lower extremity    Low back pain with left-sided sciatica    Chronic back pain 10/03/2015   Anemia 04/23/2017   Acute bronchitis 04/23/2017   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/19/2017   Lateral meniscus, posterior horn derangement, left 05/14/2018   Status post arthroscopy of left knee 05/14/2018   Unilateral primary osteoarthritis, left knee 12/01/2018   Cardiac arrest ([place]) 12/01/2020   Type 1 diabetes mellitus without complication ([place]) 99/83/3825   Hypertension 12/02/2020   GERD (gastroesophageal reflux disease) 12/02/2020   Anemia 12/02/2020   Encephalopathy acute    Acute systolic heart failure ([place])    Dysphagia    Goals of care, counseling/discussion    Fever 12/20/2020   Abdominal wall cellulitis 12/20/2020   Anoxic brain injury ([place]) 01/02/2021   Acute blood loss anemia    Controlled type 2 diabetes mellitus with hyperglycemia ([place])    Essential hypertension    Cough 04/26/2021   Resolved Ambulatory Problems    Diagnosis Date Noted   Other tear of medial meniscus, current injury, left knee, subsequent encounter 01/30/2018   Past Medical History:  Diagnosis Date   Arthritis    Asthma    B12 deficiency anemia 04/23/2017   Chronic bronchitis (HCC)    Chronic lower back pain    Diabetes mellitus without complication ([place])    Diabetes type 2, controlled ([place])    Elevated ferritin level    Fatty liver    GERD with stricture    Headache    History of blood transfusion "plenty"   Inguinal hernia    Low back pain    Migraine    OA (osteoarthritis) of knee--left    Vitamin B 12 deficiency     SOCIAL HX:  Social History   Tobacco Use   Smoking status: Unknown   Smokeless tobacco: Never  Substance Use Topics   Alcohol use: Not Currently    Comment: occ     ALLERGIES:  Allergies  Allergen Reactions   Latex Hives   Other Other (See Comments), Anaphylaxis, Swelling and Hives    Hair glue causes throat to close and hives "glue" Hair glue causes throat to close  Latex Hives   Codeine Nausea And Vomiting    Was on an empty stomach.   Nitrofurantoin Rash     PERTINENT MEDICATIONS:  Outpatient Encounter Medications as of 07/04/2021  Medication Sig   albuterol (VENTOLIN HFA) 108 (90 Base) MCG/ACT inhaler Inhale 2 puffs into the lungs every 6 (six) hours as needed for wheezing or shortness of breath.   amiodarone (PACERONE) 200 MG tablet Take 1 tablet (200 mg total) by mouth daily.   benzonatate (TESSALON) 100 MG capsule Take 100 mg by mouth 3 (three) times daily.   carvedilol (COREG) 6.25 MG tablet Take 1 tablet (6.25 mg total) by mouth 2 (two) times daily with a meal.   divalproex (DEPAKOTE) 250 MG DR tablet Take 2 tablets (500 mg total) by mouth 3 (three) times daily. (Patient taking differently: Take 250 mg by mouth daily.)   docusate sodium (COLACE) 100 MG capsule Take 2 capsules (200 mg total) by mouth daily.   fluticasone furoate-vilanterol (BREO ELLIPTA) 200-25 MCG/ACT AEPB Inhale 1 puff into the lungs daily.   levofloxacin (LEVAQUIN) 250 MG tablet Take 250 mg by mouth daily.   LINZESS 290 MCG CAPS capsule Take 290 mcg by mouth daily.   losartan (COZAAR) 25 MG tablet Take 1 tablet (25 mg total) by mouth daily.   meloxicam (MOBIC) 7.5 MG tablet Take 7.5 mg by mouth daily.   Multiple Vitamin (MULTIVITAMIN WITH MINERALS) TABS tablet Take 1 tablet by mouth daily.   pantoprazole (PROTONIX) 40 MG tablet Take 1 tablet (40 mg total) by mouth 2 (two) times daily. (Patient taking differently: Take 40 mg by mouth at bedtime.)   SYMBICORT 160-4.5 MCG/ACT inhaler Inhale 2  puffs into the lungs in the morning and at bedtime.   No facility-administered encounter medications on file as of 07/04/2021.   Thank you for the opportunity to participate in the care of Ms. Pett.  The palliative care team will continue to follow. Please call our office at [phone] if we can be of additional assistance.   Hollace Kinnier, DO  COVID-19 PATIENT SCREENING TOOL Asked and negative response unless otherwise noted:  Have you had symptoms of covid, tested positive or been in contact with someone with symptoms/positive test in the past 5-10 days? no

## 2021-07-10 NOTE — Telephone Encounter (Signed)
Pt Sister called back in to schedule the implant  ? ?Best number [phone] ? ? ?

## 2021-07-21 NOTE — Telephone Encounter (Signed)
Spoke to pt and sister. ?Agreeable to rescheduling SICD implant for 5/3. ?Aware I will send instructions via mychart and be in touch w/I next several weeks to verbally go back over procedure instructions. ?Patient & sister verbalized understanding and agreeable to plan.  ? ?

## 2021-08-04 NOTE — Telephone Encounter (Signed)
Followed up w/ pt and sister. ?They need to r/s procedure to 5/31. ?Pt will obtain pre procedure blood work 5/3. ?Reviewed procedure instructions and aware I will send letter via mychart. ?Patient & sister verbalized understanding and agreeable to plan.  ? ?

## 2021-08-08 ENCOUNTER — Encounter: Payer: Self-pay | Admitting: Internal Medicine

## 2021-08-08 ENCOUNTER — Other Ambulatory Visit: Payer: Medicaid Other | Admitting: Internal Medicine

## 2021-08-08 DIAGNOSIS — I4901 Ventricular fibrillation: Secondary | ICD-10-CM

## 2021-08-08 DIAGNOSIS — Z515 Encounter for palliative care: Secondary | ICD-10-CM

## 2021-08-08 DIAGNOSIS — G931 Anoxic brain damage, not elsewhere classified: Secondary | ICD-10-CM

## 2021-08-08 DIAGNOSIS — F321 Major depressive disorder, single episode, moderate: Secondary | ICD-10-CM

## 2021-08-08 NOTE — Progress Notes (Signed)
[facility] [facility] Palliative Care Follow-Up Visit Telephone: [phone]  Fax: [phone]   Date of encounter: 08/08/21 9:30 AM PATIENT NAME: Courtney Davies [address] [place] [address]   [phone] (home)  DOB: 1976-02-06 MRN: [id number] PRIMARY CARE PROVIDER:    Center, [facility],  [place] [address] [phone]  REFERRING PROVIDER:   Center, [facility] [address] [place],  [place] [address] [phone]  RESPONSIBLE PARTY:    Contact Information     Name Relation Home Work [place] Sister   [phone]   Gizzelle, [place] Mother [phone]  [phone]   Septer,[name] Sister [phone]  [phone]   Spicher,[name] Father   [phone]        I met face to face with patient and family in her mother's home. Palliative Care was asked to follow this patient by consultation request of  Center, [place] to address advance care planning and complex medical decision making. This is follow-up visit.                                     ASSESSMENT AND PLAN / RECOMMENDATIONS:   Advance Care Planning/Goals of Care: Goals include to maximize quality of life and symptom management. Patient/health care surrogate gave his/her permission to discuss.Our advance care planning conversation included a discussion about:    The value and importance of advance care planning  Experiences with loved ones who have been seriously ill or have died  Exploration of personal, cultural or spiritual beliefs that might influence medical decisions  Exploration of goals of care in the event of a sudden injury or illness  Identification  of a healthcare agent Review and updating or creation of an  advance directive document . Decision not to resuscitate or to de-escalate disease focused treatments due to poor prognosis. CODE STATUS:  FULL CODE Goals:  to return to work if she can in some capacity; live at  her home and return to as much independence as possible; finally get her defibrillator implanted  Symptom Management/Plan: 1. Current moderate episode of major depressive disorder without prior episode (HCC) -mood much improved with low dose sertraline, but I suspect benefit was more from returning to her home environment where she was able to function better  -continue sertraline due to big improvements noted in spirits today and mother and aunt adamantly opposed to stopping the med after her improvement  2. Anoxic brain injury ([place]) -memory loss related to this remains considerable--she was better able to articulate her thoughts with me today than at previous visit; however, still repeated questions multiple times during visit  -she managed with some guidance ahead to manage her own insulin regimen, take her meds from a prefilled pillbox and cook full meals for her family on the gas stove without incident for a week -she's been driving without use of a GPS short distances around town without getting lost  3. Ventricular fibrillation [facility]) -is still pending debrillator insertion with Dr. Caro Laroche sister reports this is potentially going to be 09/12/21 and she will get her bloodwork in cardiology after she sees her Dr. Naaman Plummer 08/16/21 for her regular follow-up rehab visit  4. Palliative care by specialist -reviewed appts for the future--sees PCP, Dr. Holley Raring, at [facility] later today--emphasized importance of reviewing her current meds with him as they were concerned that refills were being sent  for meds she was no longer taking and she'd, unfortunately picked them up so charged for them at the pharmacy--pt's mother is going along with her to the appt  -family is considering changing a PCP to within [facility] health so that everything can be in [place] (pt's sister, [name], manages account for her and has info about family medicine clinic with phone number)  Follow up Palliative Care Visit: Palliative  care will continue to follow for complex medical decision making, advance care planning, and clarification of goals. Return 6 weeks and prn.  10/03/2021 NOTE THAT PATIENT MAY BE STAYING AT A DIFFERENT LOCATION FOR NEXT VISIT (WITH SISTER OR IN HER OWN HOME SO MUST VERIFY BEFORE GOING--MOM'S PHONE IS [phone].  Pt has my contact info and our office.  I spent 60 minutes providing this consultation. More than 50% of the time in this consultation was spent in counseling and care coordination.  PPS: 60%  HOSPICE ELIGIBILITY/DIAGNOSIS: Not eligible at this time  Chief Complaint: Follow-up palliative visit  HISTORY OF PRESENT ILLNESS:  Courtney Davies is a 46 y.o. 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     She had a good week at home functioning fairly independently cooking meals for her children without incident.  She also drove around w/o GPS.  She took her meds in her pillbox filled by her mother properly.  She was taking her insulin as ordered (lantus nightly and humalog bid).  She is still working on checking her sugars more often--recent fasting glucose readings have been 94 and lower 100s which is much improved from 300s prior to insulin dosing by PCP.  She's had bilateral steroid knee injections with ortho on 4/5 and weight at that visit was 226lbs.  Pt reports improved pain and also better diet recently in effort to lose weight.    [name] explained that they were in touch with a Education officer, museum who had started the process of getting her on disability at least temporarily so she could have some income.  She still needs to have the cognitive assessment performed for this.  Ideally, this will happen before her defibrillator surgery end of May.  History obtained from review of EMR, discussion with primary team, and interview with family, facility  staff/caregiver and/or Ms. Heinke.  I reviewed available labs, medications, imaging, studies and related documents from the EMR.  Records reviewed and summarized above.   ROS   General: NAD EYES: denies vision changes, has headache this am ENMT: denies dysphagia Cardiovascular: denies chest pain, denies DOE Pulmonary: denies cough, denies increased SOB Abdomen: endorses good appetite, denies constipation, endorses continence of bowel GU: denies dysuria, endorses incontinence of urine MSK:  denies increased weakness,  no falls reported, bilateral knee OA Skin: denies rashes or wounds Neurological: denies pain, denies insomnia Psych: Endorses positive mood vs tearful last visit Heme/lymph/immuno: denies bruises, abnormal bleeding  Physical Exam: Current and past weights: 226 lbs on 4/5 down from 230 lbs on 06/22/21 Constitutional: NAD General: obese  EYES: anicteric sclera, lids intact, no discharge  ENMT: intact hearing, oral mucous membranes moist, dentition intact CV: S1S2, RRR, no LE edema Pulmonary: LCTA, no increased work of breathing, no cough, room air Abdomen: intake 100%, normo-active BS + 4 quadrants, soft and non tender, no ascites GU: deferred MSK: no sarcopenia, moves all extremities, ambulatory Skin: warm and dry, no rashes or  wounds on visible skin Neuro:  no generalized weakness,  no cognitive impairment Psych: non-anxious affect, A and O x 3 but poor short-term memory Hem/lymph/immuno: no widespread bruising  CURRENT PROBLEM LIST:  Patient Active Problem List   Diagnosis Date Noted   Cough 04/26/2021   Acute blood loss anemia    Controlled type 2 diabetes mellitus with hyperglycemia ([place])    Essential hypertension    Anoxic brain injury ([place]) 01/02/2021   Fever 12/20/2020   Abdominal wall cellulitis 12/20/2020   Dysphagia    Goals of care, counseling/discussion    Acute systolic heart failure ([place])    Encephalopathy acute    Type 1 diabetes mellitus  without complication ([place]) 44/96/7591   Hypertension 12/02/2020   GERD (gastroesophageal reflux disease) 12/02/2020   Anemia 12/02/2020   Cardiac arrest ([place]) 12/01/2020   Unilateral primary osteoarthritis, left knee 12/01/2018   Lateral meniscus, posterior horn derangement, left 05/14/2018   Status post arthroscopy of left knee 05/14/2018   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/19/2017   Anemia 04/23/2017   Acute bronchitis 04/23/2017   Chronic back pain 10/03/2015   Symptomatic anemia 04/10/2014   Bilateral edema of lower extremity    Low back pain with left-sided sciatica    PAST MEDICAL HISTORY:  Active Ambulatory Problems    Diagnosis Date Noted   Symptomatic anemia 04/10/2014   Bilateral edema of lower extremity    Low back pain with left-sided sciatica    Chronic back pain 10/03/2015   Anemia 04/23/2017   Acute bronchitis 04/23/2017   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/19/2017   Lateral meniscus, posterior horn derangement, left 05/14/2018   Status post arthroscopy of left knee 05/14/2018   Unilateral primary osteoarthritis, left knee 12/01/2018   Cardiac arrest ([place]) 12/01/2020   Type 1 diabetes mellitus without complication ([place]) 63/84/6659   Hypertension 12/02/2020   GERD (gastroesophageal reflux disease) 12/02/2020   Anemia 12/02/2020   Encephalopathy acute    Acute systolic heart failure ([place])    Dysphagia    Goals of care, counseling/discussion    Fever 12/20/2020   Abdominal wall cellulitis 12/20/2020   Anoxic brain injury ([place]) 01/02/2021   Acute blood loss anemia    Controlled type 2 diabetes mellitus with hyperglycemia ([place])    Essential hypertension    Cough 04/26/2021   Resolved Ambulatory Problems    Diagnosis Date Noted   Other tear of medial meniscus, current injury, left knee, subsequent encounter 01/30/2018   Past Medical History:  Diagnosis Date   Arthritis    Asthma    B12 deficiency anemia 04/23/2017   Chronic bronchitis (HCC)     Chronic lower back pain    Diabetes mellitus without complication ([place])    Diabetes type 2, controlled ([place])    Elevated ferritin level    Fatty liver    GERD with stricture    Headache    History of blood transfusion "plenty"   Inguinal hernia    Low back pain    Migraine    OA (osteoarthritis) of knee--left    Vitamin B 12 deficiency    SOCIAL HX:  Social History   Tobacco Use   Smoking status: Unknown   Smokeless tobacco: Never  Substance Use Topics   Alcohol use: Not Currently    Comment: occ     ALLERGIES:  Allergies  Allergen Reactions   Latex Hives   Other Other (See Comments), Anaphylaxis, Swelling and Hives    Hair glue  causes throat to close and hives "glue" Hair glue causes throat to close    Latex Hives   Codeine Nausea And Vomiting    Was on an empty stomach.   Nitrofurantoin Rash     PERTINENT MEDICATIONS:  Outpatient Encounter Medications as of 08/08/2021  Medication Sig   albuterol (VENTOLIN HFA) 108 (90 Base) MCG/ACT inhaler Inhale 2 puffs into the lungs every 6 (six) hours as needed for wheezing or shortness of breath.   amiodarone (PACERONE) 200 MG tablet Take 1 tablet (200 mg total) by mouth daily.   benzonatate (TESSALON) 100 MG capsule Take 100 mg by mouth 3 (three) times daily.   carvedilol (COREG) 6.25 MG tablet Take 1 tablet (6.25 mg total) by mouth 2 (two) times daily with a meal.   divalproex (DEPAKOTE) 250 MG DR tablet Take 2 tablets (500 mg total) by mouth 3 (three) times daily. (Patient taking differently: Take 250 mg by mouth daily.)   docusate sodium (COLACE) 100 MG capsule Take 2 capsules (200 mg total) by mouth daily.   fluticasone furoate-vilanterol (BREO ELLIPTA) 200-25 MCG/ACT AEPB Inhale 1 puff into the lungs daily.   levofloxacin (LEVAQUIN) 250 MG tablet Take 250 mg by mouth daily.   LINZESS 290 MCG CAPS capsule Take 290 mcg by mouth daily.   losartan (COZAAR) 25 MG tablet Take 1 tablet (25 mg total) by mouth daily.    meloxicam (MOBIC) 7.5 MG tablet Take 7.5 mg by mouth daily.   Multiple Vitamin (MULTIVITAMIN WITH MINERALS) TABS tablet Take 1 tablet by mouth daily.   pantoprazole (PROTONIX) 40 MG tablet Take 1 tablet (40 mg total) by mouth 2 (two) times daily. (Patient taking differently: Take 40 mg by mouth at bedtime.)   sertraline (ZOLOFT) 25 MG tablet Take 1 tablet (25 mg total) by mouth daily.   SYMBICORT 160-4.5 MCG/ACT inhaler Inhale 2 puffs into the lungs in the morning and at bedtime.   No facility-administered encounter medications on file as of 08/08/2021.   Thank you for the opportunity to participate in the care of Ms. Newsome.  The palliative care team will continue to follow. Please call our office at [phone] if we can be of additional assistance.   Lulia Schriner L. Mariea Clonts DO, CMD [facility]  [place], [place] [address] Office (8:30am-5pm):  [phone]) Mobile (8:30am-5pm):  [phone] On call (after 5pm):  [phone]   COVID-19 PATIENT SCREENING TOOL Asked and negative response unless otherwise noted:  Have you had symptoms of covid, tested positive or been in contact with someone with symptoms/positive test in the past 5-10 days? no

## 2021-08-16 ENCOUNTER — Encounter: Payer: Medicaid Other | Attending: Physical Medicine & Rehabilitation | Admitting: Physical Medicine & Rehabilitation

## 2021-08-23 ENCOUNTER — Ambulatory Visit: Payer: Medicaid Other | Attending: Internal Medicine

## 2021-08-23 DIAGNOSIS — Z23 Encounter for immunization: Secondary | ICD-10-CM

## 2021-08-24 ENCOUNTER — Other Ambulatory Visit (HOSPITAL_BASED_OUTPATIENT_CLINIC_OR_DEPARTMENT_OTHER): Payer: Self-pay

## 2021-08-24 MED ORDER — PFIZER COVID-19 VAC BIVALENT 30 MCG/0.3ML IM SUSP
INTRAMUSCULAR | 0 refills | Status: DC
  Filled 2021-08-24: qty 0.3, 1d supply, fill #0

## 2021-08-24 NOTE — Progress Notes (Signed)
? ?  Covid-19 Vaccination Clinic ? ?Name:  Courtney Davies    ?MRN: [id number] ?DOB: 08-13-1975 ? ?08/24/2021 ? ?Ms. Scales was observed post Covid-19 immunization for 15 minutes without incident. She was provided with Vaccine Information Sheet and instruction to access the V-Safe system.  ? ?[name] was instructed to call 911 with any severe reactions post vaccine: ?Difficulty breathing  ?Swelling of face and throat  ?A fast heartbeat  ?A bad rash all over body  ?Dizziness and weakness  ? ?Immunizations Administered   ? ? Name Date Dose VIS Date Route  ? Ambulance person Booster 08/23/2021 12:19 PM 0.3 mL 12/14/2020 Intramuscular  ? Manufacturer: [place]: [id number]  ? [place]: [phone]  ? ?  ? ? ?

## 2021-09-04 ENCOUNTER — Telehealth: Payer: Self-pay | Admitting: Cardiology

## 2021-09-04 DIAGNOSIS — I469 Cardiac arrest, cause unspecified: Secondary | ICD-10-CM

## 2021-09-04 DIAGNOSIS — Z01812 Encounter for preprocedural laboratory examination: Secondary | ICD-10-CM

## 2021-09-04 NOTE — Telephone Encounter (Signed)
Left detailed message informing sister that yes procedure is still a go for next Wednesday, 5/31. Aware I am out of the office tomorrow but will be in touch this week to go over any instructions.

## 2021-09-04 NOTE — Telephone Encounter (Signed)
Patient's sister is following up regarding ICD impant. See 3/21 encounter. She assumes it is still scheduled and would like to discuss.

## 2021-09-08 ENCOUNTER — Encounter: Payer: Self-pay | Admitting: *Deleted

## 2021-09-08 ENCOUNTER — Encounter: Payer: Medicaid Other | Admitting: *Deleted

## 2021-09-08 DIAGNOSIS — I469 Cardiac arrest, cause unspecified: Secondary | ICD-10-CM

## 2021-09-08 DIAGNOSIS — Z01812 Encounter for preprocedural laboratory examination: Secondary | ICD-10-CM

## 2021-09-08 NOTE — Telephone Encounter (Signed)
Spoke to pt and her sister. Reviewed procedure instructions with pt and sister. Instruction letter to be sent via mychart. Pt will stop by the office today for lab work. Aware office will call to schedule post procedure follow up. Patient & sister verbalized understanding and agreeable to plan.

## 2021-09-12 NOTE — Pre-Procedure Instructions (Signed)
Instructed patient on the following items: Arrival time 1230 Nothing to eat or drink after midnight No meds AM of procedure Responsible person to drive you home and stay with you for 24 hrs Wash with special soap night before and morning of procedure

## 2021-09-13 ENCOUNTER — Ambulatory Visit (HOSPITAL_BASED_OUTPATIENT_CLINIC_OR_DEPARTMENT_OTHER): Payer: Medicaid Other | Admitting: Certified Registered"

## 2021-09-13 ENCOUNTER — Encounter (HOSPITAL_COMMUNITY)
Admission: RE | Disposition: A | Discharge status: Home / Self Care | Payer: Self-pay | Source: Home / Self Care | Attending: Cardiology

## 2021-09-13 ENCOUNTER — Ambulatory Visit (HOSPITAL_COMMUNITY): Payer: Medicaid Other | Admitting: Certified Registered"

## 2021-09-13 ENCOUNTER — Ambulatory Visit (HOSPITAL_COMMUNITY): Payer: Medicaid Other

## 2021-09-13 ENCOUNTER — Observation Stay (HOSPITAL_COMMUNITY)
Admission: RE | Admit: 2021-09-13 | Discharge: 2021-09-14 | Disposition: A | Discharge status: Home / Self Care | Payer: Medicaid Other | Attending: Cardiology | Admitting: Cardiology

## 2021-09-13 ENCOUNTER — Other Ambulatory Visit: Payer: Self-pay

## 2021-09-13 DIAGNOSIS — I10 Essential (primary) hypertension: Secondary | ICD-10-CM | POA: Insufficient documentation

## 2021-09-13 DIAGNOSIS — E119 Type 2 diabetes mellitus without complications: Secondary | ICD-10-CM | POA: Diagnosis not present

## 2021-09-13 DIAGNOSIS — I469 Cardiac arrest, cause unspecified: Principal | ICD-10-CM | POA: Insufficient documentation

## 2021-09-13 DIAGNOSIS — Z91040 Latex allergy status: Secondary | ICD-10-CM | POA: Insufficient documentation

## 2021-09-13 DIAGNOSIS — G709 Myoneural disorder, unspecified: Secondary | ICD-10-CM

## 2021-09-13 DIAGNOSIS — I4901 Ventricular fibrillation: Secondary | ICD-10-CM | POA: Insufficient documentation

## 2021-09-13 DIAGNOSIS — J45909 Unspecified asthma, uncomplicated: Secondary | ICD-10-CM | POA: Insufficient documentation

## 2021-09-13 DIAGNOSIS — Z95810 Presence of automatic (implantable) cardiac defibrillator: Secondary | ICD-10-CM | POA: Diagnosis present

## 2021-09-13 HISTORY — PX: SUBQ ICD IMPLANT: EP1223

## 2021-09-13 LAB — GLUCOSE, CAPILLARY
Glucose-Capillary: 134 mg/dL — ABNORMAL HIGH (ref 70–99)
Glucose-Capillary: 151 mg/dL — ABNORMAL HIGH (ref 70–99)
Glucose-Capillary: 177 mg/dL — ABNORMAL HIGH (ref 70–99)
Glucose-Capillary: 257 mg/dL — ABNORMAL HIGH (ref 70–99)

## 2021-09-13 SURGERY — SUBQ ICD IMPLANT
Anesthesia: General

## 2021-09-13 MED ORDER — ONDANSETRON HCL 4 MG/2ML IJ SOLN: 4.0000 mg | Freq: Four times a day (QID) | INTRAMUSCULAR | Status: DC | PRN

## 2021-09-13 MED ORDER — SODIUM CHLORIDE 0.9 % IV SOLN
INTRAVENOUS | Status: AC
  Filled 2021-09-13: qty 2

## 2021-09-13 MED ORDER — AMIODARONE HCL 200 MG PO TABS
200.0000 mg | ORAL_TABLET | Freq: Every day | ORAL | Status: DC
  Administered 2021-09-13 – 2021-09-14 (×2): 200 mg via ORAL
  Filled 2021-09-13 (×2): qty 1

## 2021-09-13 MED ORDER — OXYCODONE HCL 5 MG PO TABS
5.0000 mg | ORAL_TABLET | ORAL | Status: DC | PRN
  Administered 2021-09-13 – 2021-09-14 (×4): 5 mg via ORAL
  Filled 2021-09-13 (×4): qty 1

## 2021-09-13 MED ORDER — PROPOFOL 10 MG/ML IV BOLUS
INTRAVENOUS | Status: DC | PRN
  Administered 2021-09-13: 200 mg via INTRAVENOUS

## 2021-09-13 MED ORDER — FENTANYL CITRATE (PF) 100 MCG/2ML IJ SOLN
INTRAMUSCULAR | Status: AC
  Filled 2021-09-13: qty 2

## 2021-09-13 MED ORDER — SODIUM CHLORIDE 0.9 % IV SOLN
80.0000 mg | INTRAVENOUS | Status: AC
  Administered 2021-09-13: 80 mg

## 2021-09-13 MED ORDER — ACETAMINOPHEN 325 MG PO TABS: 325.0000 mg | ORAL_TABLET | ORAL | Status: DC | PRN

## 2021-09-13 MED ORDER — HEPARIN (PORCINE) IN NACL 1000-0.9 UT/500ML-% IV SOLN
INTRAVENOUS | Status: DC | PRN
  Administered 2021-09-13: 500 mL

## 2021-09-13 MED ORDER — INSULIN ASPART 100 UNIT/ML IJ SOLN
0.0000 [IU] | INTRAMUSCULAR | Status: DC
  Administered 2021-09-13: 3 [IU] via SUBCUTANEOUS
  Administered 2021-09-13: 8 [IU] via SUBCUTANEOUS
  Administered 2021-09-14: 2 [IU] via SUBCUTANEOUS
  Administered 2021-09-14: 3 [IU] via SUBCUTANEOUS

## 2021-09-13 MED ORDER — ONDANSETRON HCL 4 MG/2ML IJ SOLN
INTRAMUSCULAR | Status: DC | PRN
  Administered 2021-09-13: 4 mg via INTRAVENOUS

## 2021-09-13 MED ORDER — PANTOPRAZOLE SODIUM 40 MG PO TBEC
40.0000 mg | DELAYED_RELEASE_TABLET | Freq: Every day | ORAL | Status: DC
  Administered 2021-09-13: 40 mg via ORAL
  Filled 2021-09-13: qty 1

## 2021-09-13 MED ORDER — HYDROCODONE-ACETAMINOPHEN 7.5-325 MG PO TABS
1.0000 | ORAL_TABLET | Freq: Three times a day (TID) | ORAL | Status: DC | PRN
  Administered 2021-09-13: 1 via ORAL
  Filled 2021-09-13: qty 1

## 2021-09-13 MED ORDER — FENTANYL CITRATE (PF) 250 MCG/5ML IJ SOLN
INTRAMUSCULAR | Status: DC | PRN
  Administered 2021-09-13: 50 ug via INTRAVENOUS
  Administered 2021-09-13 (×2): 25 ug via INTRAVENOUS
  Administered 2021-09-13: 50 ug via INTRAVENOUS

## 2021-09-13 MED ORDER — CARVEDILOL 6.25 MG PO TABS
6.2500 mg | ORAL_TABLET | Freq: Two times a day (BID) | ORAL | Status: DC
  Administered 2021-09-13 – 2021-09-14 (×2): 6.25 mg via ORAL
  Filled 2021-09-13 (×2): qty 1

## 2021-09-13 MED ORDER — CHLORHEXIDINE GLUCONATE 4 % EX LIQD: 60.0000 mL | Freq: Once | CUTANEOUS | Status: DC

## 2021-09-13 MED ORDER — SODIUM CHLORIDE 0.9 % IV SOLN: INTRAVENOUS | Status: DC | PRN

## 2021-09-13 MED ORDER — FLUTICASONE FUROATE-VILANTEROL 200-25 MCG/ACT IN AEPB
1.0000 | INHALATION_SPRAY | Freq: Every day | RESPIRATORY_TRACT | Status: DC
  Filled 2021-09-13: qty 28

## 2021-09-13 MED ORDER — LOSARTAN POTASSIUM 25 MG PO TABS
25.0000 mg | ORAL_TABLET | Freq: Every day | ORAL | Status: DC
  Administered 2021-09-13 – 2021-09-14 (×2): 25 mg via ORAL
  Filled 2021-09-13 (×2): qty 1

## 2021-09-13 MED ORDER — FENTANYL CITRATE (PF) 100 MCG/2ML IJ SOLN
25.0000 ug | INTRAMUSCULAR | Status: DC | PRN
  Administered 2021-09-13: 50 ug via INTRAVENOUS

## 2021-09-13 MED ORDER — LINACLOTIDE 145 MCG PO CAPS: 290.0000 ug | ORAL_CAPSULE | Freq: Every day | ORAL | Status: DC | PRN

## 2021-09-13 MED ORDER — LACTATED RINGERS IV SOLN: INTRAVENOUS | Status: DC | PRN

## 2021-09-13 MED ORDER — LIDOCAINE 2% (20 MG/ML) 5 ML SYRINGE
INTRAMUSCULAR | Status: DC | PRN
  Administered 2021-09-13: 60 mg via INTRAVENOUS

## 2021-09-13 MED ORDER — CHLORHEXIDINE GLUCONATE 4 % EX LIQD
60.0000 mL | Freq: Once | CUTANEOUS | Status: DC
  Filled 2021-09-13: qty 60

## 2021-09-13 MED ORDER — FLUTICASONE FUROATE-VILANTEROL 200-25 MCG/ACT IN AEPB
1.0000 | INHALATION_SPRAY | Freq: Every day | RESPIRATORY_TRACT | Status: DC | PRN
Start: 2021-09-13 — End: 2021-09-13
  Filled 2021-09-13: qty 28

## 2021-09-13 MED ORDER — SODIUM CHLORIDE 0.9 % IV SOLN: INTRAVENOUS | Status: DC

## 2021-09-13 MED ORDER — MELOXICAM 7.5 MG PO TABS
7.5000 mg | ORAL_TABLET | Freq: Every day | ORAL | Status: DC
  Administered 2021-09-13 – 2021-09-14 (×2): 7.5 mg via ORAL
  Filled 2021-09-13 (×2): qty 1

## 2021-09-13 MED ORDER — CEFAZOLIN SODIUM-DEXTROSE 2-4 GM/100ML-% IV SOLN
INTRAVENOUS | Status: AC
  Filled 2021-09-13: qty 100

## 2021-09-13 MED ORDER — SERTRALINE HCL 25 MG PO TABS
25.0000 mg | ORAL_TABLET | Freq: Every day | ORAL | Status: DC
  Administered 2021-09-13 – 2021-09-14 (×2): 25 mg via ORAL
  Filled 2021-09-13 (×2): qty 1

## 2021-09-13 MED ORDER — DIVALPROEX SODIUM 500 MG PO DR TAB
500.0000 mg | DELAYED_RELEASE_TABLET | Freq: Three times a day (TID) | ORAL | Status: DC
  Administered 2021-09-13 – 2021-09-14 (×2): 500 mg via ORAL
  Filled 2021-09-13 (×2): qty 1

## 2021-09-13 MED ORDER — ROCURONIUM BROMIDE 10 MG/ML (PF) SYRINGE
PREFILLED_SYRINGE | INTRAVENOUS | Status: DC | PRN
Start: 2021-09-13 — End: 2021-09-13
  Administered 2021-09-13: 50 mg via INTRAVENOUS
  Administered 2021-09-13: 20 mg via INTRAVENOUS

## 2021-09-13 MED ORDER — ALBUTEROL SULFATE (2.5 MG/3ML) 0.083% IN NEBU: 2.5000 mg | INHALATION_SOLUTION | Freq: Four times a day (QID) | RESPIRATORY_TRACT | Status: DC | PRN

## 2021-09-13 MED ORDER — ALBUTEROL SULFATE HFA 108 (90 BASE) MCG/ACT IN AERS: 2.0000 | INHALATION_SPRAY | Freq: Four times a day (QID) | RESPIRATORY_TRACT | Status: DC | PRN

## 2021-09-13 MED ORDER — LIDOCAINE HCL 1 % IJ SOLN
INTRAMUSCULAR | Status: AC
  Filled 2021-09-13: qty 80

## 2021-09-13 MED ORDER — ACETAMINOPHEN 500 MG PO TABS
1000.0000 mg | ORAL_TABLET | Freq: Once | ORAL | Status: AC
  Administered 2021-09-13: 1000 mg via ORAL
  Filled 2021-09-13: qty 2

## 2021-09-13 MED ORDER — CEFAZOLIN SODIUM-DEXTROSE 1-4 GM/50ML-% IV SOLN
1.0000 g | Freq: Four times a day (QID) | INTRAVENOUS | Status: AC
  Administered 2021-09-13 – 2021-09-14 (×3): 1 g via INTRAVENOUS
  Filled 2021-09-13 (×3): qty 50

## 2021-09-13 MED ORDER — LIDOCAINE HCL (PF) 1 % IJ SOLN
INTRAMUSCULAR | Status: DC | PRN
  Administered 2021-09-13: 80 mL

## 2021-09-13 MED ORDER — CEFAZOLIN SODIUM-DEXTROSE 2-4 GM/100ML-% IV SOLN
2.0000 g | INTRAVENOUS | Status: AC
  Administered 2021-09-13: 2 g via INTRAVENOUS
  Filled 2021-09-13: qty 100

## 2021-09-13 MED ORDER — DEXAMETHASONE SODIUM PHOSPHATE 10 MG/ML IJ SOLN
INTRAMUSCULAR | Status: DC | PRN
  Administered 2021-09-13: 4 mg via INTRAVENOUS

## 2021-09-13 SURGICAL SUPPLY — 8 items
CABLE SURGICAL S-101-97-12 (CABLE) ×2 IMPLANT
ICD SUBCU MRI EMBLEM A219 (ICD Generator) ×1 IMPLANT
LEAD SUBQU EMBLEM 3501 (Pacemaker) ×1 IMPLANT
MAT PREVALON FULL STRYKER (MISCELLANEOUS) ×1 IMPLANT
PAD DEFIB RADIO PHYSIO CONN (PAD) ×2 IMPLANT
SHEATH 9.5FR PRELUDE SNAP 13 (SHEATH) ×1 IMPLANT
SHEATH PROBE COVER 6X72 (BAG) ×1 IMPLANT
TRAY PACEMAKER INSERTION (PACKS) ×2 IMPLANT

## 2021-09-13 NOTE — Progress Notes (Signed)
Pt with increase bleeding on dsg to left lateral chest-Dr Camnitz notified-orders for pressure dsg to be applied-done

## 2021-09-13 NOTE — Anesthesia Preprocedure Evaluation (Addendum)
Anesthesia Evaluation  Patient identified by MRN, date of birth, ID band Patient awake    Reviewed: Allergy & Precautions, NPO status , Patient's Chart, lab work & pertinent test results  Airway Mallampati: II  TM Distance: >3 FB Neck ROM: Full    Dental  (+) Dental Advisory Given   Pulmonary asthma , Patient abstained from smoking.,    breath sounds clear to auscultation       Cardiovascular hypertension, Pt. on medications and Pt. on home beta blockers + dysrhythmias Ventricular Fibrillation  Rhythm:Regular Rate:Normal     Neuro/Psych  Headaches,  Neuromuscular disease    GI/Hepatic Neg liver ROS, GERD  ,  Endo/Other  diabetes  Renal/GU negative Renal ROS     Musculoskeletal   Abdominal   Peds  Hematology   Anesthesia Other Findings   Reproductive/Obstetrics                             Lab Results  Component Value Date   WBC 6.4 09/08/2021   HGB 13.0 09/08/2021   HCT 43.1 09/08/2021   MCV 77 (L) 09/08/2021   PLT 255 09/08/2021   Lab Results  Component Value Date   CREATININE 0.83 09/08/2021   BUN 18 09/08/2021   NA 139 09/08/2021   K 4.3 09/08/2021   CL 103 09/08/2021   CO2 22 09/08/2021    Anesthesia Physical Anesthesia Plan  ASA: 3  Anesthesia Plan: General   Post-op Pain Management: Tylenol PO (pre-op)* and Minimal or no pain anticipated   Induction: Intravenous  PONV Risk Score and Plan: 3 and Dexamethasone, Ondansetron, Treatment may vary due to age or medical condition and Midazolam  Airway Management Planned: Oral ETT  Additional Equipment: None  Intra-op Plan:   Post-operative Plan: Extubation in OR  Informed Consent: I have reviewed the patients History and Physical, chart, labs and discussed the procedure including the risks, benefits and alternatives for the proposed anesthesia with the patient or authorized representative who has indicated his/her  understanding and acceptance.     Dental advisory given  Plan Discussed with: CRNA  Anesthesia Plan Comments:        Anesthesia Quick Evaluation

## 2021-09-13 NOTE — Progress Notes (Signed)
Dr Curt Bears in to see pt-pt pain 10/10-plan is to admit pt

## 2021-09-13 NOTE — Anesthesia Procedure Notes (Signed)
Date/Time: 09/13/2021 1:55 PM Performed by: Amadeo Garnet, CRNA Pre-anesthesia Checklist: Patient identified, Emergency Drugs available, Suction available and Patient being monitored Patient Re-evaluated:Patient Re-evaluated prior to induction Oxygen Delivery Method: Circle system utilized Preoxygenation: Pre-oxygenation with 100% oxygen Induction Type: IV induction Ventilation: Mask ventilation without difficulty Laryngoscope Size: Mac and 4 Grade View: Grade II Tube type: Oral Tube size: 7.0 mm Number of attempts: 1 Airway Equipment and Method: Stylet and Oral airway Placement Confirmation: ETT inserted through vocal cords under direct vision, positive ETCO2 and breath sounds checked- equal and bilateral Secured at: 22 cm Tube secured with: Tape Dental Injury: Teeth and Oropharynx as per pre-operative assessment

## 2021-09-13 NOTE — H&P (Signed)
Electrophysiology Office Note   Date:  09/13/2021   ID:  Courtney Davies, DOB Aug 02, 1975, MRN [id number]  PCP:  [facility] [place]  Cardiologist:  Irish Lack Primary Electrophysiologist:  Dalvin Clipper Meredith Leeds, MD    Chief Complaint: cardiac arrest   History of Present Illness: Courtney Davies is a 46 y.o. female who is being seen today for the evaluation of cardiac arrest at the request of Courtney Haw, MD. Presenting today for electrophysiology evaluation.  She has a history significant for type 2 diabetes, hypertension.  She had a cardiac arrest August 2022.  She had ventricular fibrillation during arrest and required multiple defibrillations.  She possibly had a respiratory event prior to this.  After being discharged from the hospital, she was sent to rehab.  She made a good recovery and returned home 01/24/2021.  At the time of her arrest, she was at work.  She had a witnessed arrest.  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  When EMS arrived she was in ventricular fibrillation.  She received 5 rounds of epinephrine, 5 episodes of defibrillation and given 400 mg of amiodarone.  On arrival to the emergency room, she was intubated.  CT of the chest showed no PE.  Today, denies symptoms of palpitations, chest pain, shortness of breath, orthopnea, PND, lower extremity edema, claudication, dizziness, presyncope, syncope, bleeding, or neurologic sequela. The patient is tolerating medications without difficulties. Plan ICD today.    Past Medical History:  Diagnosis Date   Anemia 09/2015   Anemia    Arthritis    "knees" (04/23/2017)   Asthma    "teens; went away; came back" (04/23/2017)   B12 deficiency anemia 04/23/2017   Chronic bronchitis (HCC)    Chronic low back pain with sciatica    Chronic lower back pain    Diabetes mellitus without complication ([place])    Diabetes type 2, controlled ([place])    Elevated ferritin  level    Fatty liver    GERD (gastroesophageal reflux disease)    GERD with stricture    Headache    "1-2/wk" (04/23/2017)   History of blood transfusion "plenty"   "related to anemia" (04/23/2017)   Hypertension    Inguinal hernia    Low back pain    Migraine    "1-2/month" (04/23/2017)   OA (osteoarthritis) of knee--left    Symptomatic anemia 04/23/2017   Vitamin B 12 deficiency    Past Surgical History:  Procedure Laterality Date   ABDOMINAL HYSTERECTOMY  YRS AGO   COMPLETE   CESAREAN SECTION  1998; 2002   ESOPHAGEAL DILATION  02/2020   by Dr Shana Chute   INGUINAL HERNIA REPAIR Bilateral 1980s    "total of 4 surgeries" (04/23/2017)   IR GASTROSTOMY TUBE MOD SED  12/13/2020   KNEE ARTHROSCOPY WITH MEDIAL MENISECTOMY Left 01/30/2018   Procedure: LEFT KNEE ARTHROSCOPY WITH PARTIAL LATERAL MENISCECTOMY;  Surgeon: Mcarthur Rossetti, MD;  Location: [facility];  Service: Orthopedics;  Laterality: Left;   KNEE CARTILAGE SURGERY Left    TUBAL LIGATION  2002     No current facility-administered medications for this encounter.    Allergies:   Latex, Other, Codeine, and Nitrofurantoin   Social History:  The patient  reports that she has an unknown smoking status. She has never used smokeless tobacco. She reports that she does not currently use alcohol. She reports current drug use. Drug: Marijuana.   Family  History:  The patient's family history includes Anemia in her paternal grandmother; Diabetes in her father, maternal grandmother, and mother; Hypertension in her mother; Prostate cancer in her maternal uncle; Stroke in her maternal grandmother and mother; Valvular heart disease in her paternal grandmother.   ROS:  Please see the history of present illness.   Otherwise, review of systems is positive for none.   All other systems are reviewed and negative.   PHYSICAL EXAM: VS:  BP (!) 154/92   Pulse 60   Temp 97.8 F (36.6 C) (Oral)   Resp 19   Ht 5' 5.5" (1.664 m)   Wt 99.8 kg    LMP 12/03/2012   SpO2 100%   BMI 36.05 kg/m  , BMI Body mass index is 36.05 kg/m. GEN: Well nourished, well developed, in no acute distress  HEENT: normal  Neck: no JVD, carotid bruits, or masses Cardiac: RRR; no murmurs, rubs, or gallops,no edema  Respiratory:  clear to auscultation bilaterally, normal work of breathing GI: soft, nontender, nondistended, + BS MS: no deformity or atrophy  Skin: warm and dry Neuro:  Strength and sensation are intact Psych: euthymic mood, full affect  Recent Labs: 12/01/2020: B Natriuretic Peptide 37.2 12/05/2020: TSH 2.467 12/14/2020: Magnesium 2.0 06/19/2021: ALT 18 09/08/2021: BUN 18; Creatinine, Ser 0.83; Hemoglobin 13.0; Platelets 255; Potassium 4.3; Sodium 139    Lipid Panel     Component Value Date/Time   CHOL 163 12/02/2020 0834   TRIG 151 (H) 12/07/2020 0510   HDL 33 (L) 12/02/2020 0834   CHOLHDL 4.9 12/02/2020 0834   VLDL 40 12/02/2020 0834   LDLCALC 90 12/02/2020 0834     Wt Readings from Last 3 Encounters:  09/13/21 99.8 kg  06/22/21 104.3 kg  06/19/21 99 kg      Other studies Reviewed: Additional studies/ records that were reviewed today include: CMRI 03/14/21  Review of the above records today demonstrates:  1. Normal LV size, mild hypertrophy, and low normal systolic function (EF 16%). Septal bounce   2.  Normal RV size and systolic function (EF 10%)   3.  No late gadolinium enhancement   4.  Mild mitral regurgitation (regurgitant fraction 11%)  Coronary CTA 02/02/2021 1. No evidence of CAD (0%). Consider non-atherosclerotic causes of chest pain.  ASSESSMENT AND PLAN:  1.  Ventricular fibrillation/cardiac arrest:   ICD Criteria  Current LVEF:53%. Within 12 months prior to implant: Yes   Heart failure history: No  Cardiomyopathy history: No.  Atrial Fibrillation/Atrial Flutter: No.  Ventricular tachycardia history: Yes, Hemodynamic instability present. VT Type is unknown.  Cardiac arrest history: Yes,  Ventricular Tachycardia.  History of syndromes with risk of sudden death: No.  Previous ICD: No.  Current ICD indication: Secondary  PPM indication: No.  Class I or II Bradycardia indication present: No  Beta Blocker therapy for 3 or more months: Yes, prescribed.   Ace Inhibitor/ARB therapy for 3 or more months: No, medical reason.   I have seen KIMYETTA FLOTT is a 46 y.o. femalepre-procedural and has been referred by [place] for consideration of ICD implant for secondary prevention of sudden death.  The patient's chart has been reviewed and they meet criteria for ICD implant.  I have had a thorough discussion with the patient reviewing options.  The patient and their family (if available) have had opportunities to ask questions and have them answered. The patient and I have decided together through the [place] Decision Support Tool to implant ICD  at this time.  Risks, benefits, alternatives to ICD implantation were discussed in detail with the patient today. The patient  understands that the risks include but are not limited to bleeding, infection, pneumothorax, perforation, tamponade, vascular damage, renal failure, MI, stroke, death, inappropriate shocks, and lead dislodgement and  wishes to proceed.

## 2021-09-13 NOTE — Transfer of Care (Signed)
Immediate Anesthesia Transfer of Care Note  Patient: Courtney Davies  Procedure(s) Performed: SUBQ ICD IMPLANT  Patient Location: Cath Lab  Anesthesia Type:General  Level of Consciousness: awake and patient cooperative  Airway & Oxygen Therapy: Patient Spontanous Breathing and Patient connected to nasal cannula oxygen  Post-op Assessment: Report given to RN and Post -op Vital signs reviewed and stable  Post vital signs: Reviewed and stable  Last Vitals:  Vitals Value Taken Time  BP 188/113 09/13/21 1603  Temp    Pulse 77 09/13/21 1605  Resp 25 09/13/21 1605  SpO2 99 % 09/13/21 1605  Vitals shown include unvalidated device data.  Last Pain:  Vitals:   09/13/21 1328  TempSrc:   PainSc: 0-No pain         Complications: There were no known notable events for this encounter.

## 2021-09-14 ENCOUNTER — Encounter (HOSPITAL_COMMUNITY): Payer: Self-pay | Admitting: Cardiology

## 2021-09-14 ENCOUNTER — Other Ambulatory Visit (HOSPITAL_COMMUNITY): Payer: Self-pay

## 2021-09-14 DIAGNOSIS — Z95810 Presence of automatic (implantable) cardiac defibrillator: Secondary | ICD-10-CM

## 2021-09-14 DIAGNOSIS — I469 Cardiac arrest, cause unspecified: Secondary | ICD-10-CM | POA: Diagnosis not present

## 2021-09-14 LAB — HEMOGLOBIN A1C
Hgb A1c MFr Bld: 8.1 % — ABNORMAL HIGH (ref 4.8–5.6)
Mean Plasma Glucose: 185.77 mg/dL

## 2021-09-14 LAB — GLUCOSE, CAPILLARY
Glucose-Capillary: 159 mg/dL — ABNORMAL HIGH (ref 70–99)
Glucose-Capillary: 145 mg/dL — ABNORMAL HIGH (ref 70–99)

## 2021-09-14 MED ORDER — OXYCODONE-ACETAMINOPHEN 7.5-325 MG PO TABS
1.0000 | ORAL_TABLET | ORAL | 0 refills | Status: AC | PRN
  Filled 2021-09-14: qty 28, 5d supply, fill #0

## 2021-09-14 NOTE — Anesthesia Postprocedure Evaluation (Signed)
Anesthesia Post Note  Patient: Courtney Davies  Procedure(s) Performed: SUBQ ICD IMPLANT     Patient location during evaluation: PACU Anesthesia Type: General Level of consciousness: awake and alert Pain management: pain level controlled Vital Signs Assessment: post-procedure vital signs reviewed and stable Respiratory status: spontaneous breathing, nonlabored ventilation, respiratory function stable and patient connected to nasal cannula oxygen Cardiovascular status: blood pressure returned to baseline and stable Postop Assessment: no apparent nausea or vomiting Anesthetic complications: no   There were no known notable events for this encounter.  Last Vitals:  Vitals:   09/14/21 0700 09/14/21 0743  BP:  (!) 152/102  Pulse: (!) 59 63  Resp: 15 20  Temp:  36.6 C  SpO2: 97% 100%    Last Pain:  Vitals:   09/14/21 0747  TempSrc:   PainSc: 0-No pain                 Tiajuana Amass

## 2021-09-14 NOTE — Plan of Care (Signed)

## 2021-09-14 NOTE — Progress Notes (Signed)
Pt not feeling well, waiting for breakfast VSS. Returned to bed.

## 2021-09-14 NOTE — Progress Notes (Signed)
Pt ate breakfast now complaining she is tired and wants to sleep refused to walk at this time.

## 2021-09-14 NOTE — Discharge Instructions (Signed)
Subcutaneous Cardioverter Defibrillator Implantation, Care After  This sheet gives you information about how to care for yourself after your procedure. Your health care provider may also give you more specific instructions. If you have problems or questions, contact your health care provider.  What can I expect after the procedure? After the procedure, it is common to have: Some pain. It may last a few days. A slight bump under the skin where the subcutaneous implantation cardioverter defibrillator (S-ICD) is. You may be able to feel the device under the skin. This is normal.  Follow these instructions at home: Medicines Take over-the-counter and prescription medicines only as told by your health care provider.   Incision care   Follow instructions from your health care provider about how to take care of your incision. Make sure you: Leave stitches (sutures), skin glue, or adhesive strips in place. These skin closures may need to stay in place for 2 weeks or longer. If adhesive strip edges start to loosen and curl up, you may trim the loose edges. Do not remove adhesive strips completely unless your health care provider tells you to do that. Check your incision area every day for signs of infection. Check for: Redness, swelling, or pain. Fluid or blood. Warmth. Pus or a bad smell.  Activity Do not lift anything that is heavier than 10 lb (4.5 kg), or the limit that you are told, until your health care provider says that it is safe. Avoid sports and any other activity that could cause a hit to the generator or leads. Ask your health care provider what activities are safe for you and when you may return to your normal activities. Follow instructions from your health care provider about exercise and sexual activity restrictions after your procedure.  Electric and magnetic fields Tell all health care providers, including your dentist, that you have a defibrillator. They need to know this so  they do not give you an MRI scan, which uses strong magnets. When using your cell phone, hold it to the ear that is on the opposite side from the defibrillator. Do not leave your cell phone in a pocket over the defibrillator. If you must pass through a metal detector, quickly walk through it. Do not stop under the detector, and do not stand near it. Avoid places or objects that have a strong electric or magnetic field, including: Engineer, maintenance. At the airport, tell officials that you have a defibrillator. Your defibrillator ID card will let you be checked in a way that is safe for you and will not damage your defibrillator. Also, do not let a security person wave a magnetic wand near your defibrillator. That can make it stop working. Power plants. Large electrical generators. Anti-theft systems or electronic article surveillance (EAS). Radiofrequency transmission towers, such as cell phone and radio towers. Do not use amateur (ham) radio equipment or electric (arc) welding torches. Some devices are safe to use if they are held 12 inches (30 cm) or more away from your defibrillator. These include power tools, lawn mowers, and speakers. If you are not sure if something is safe to use, ask your health care provider. Do not use MP3 player headphones. They have magnets. You may safely use electric blankets, heating pads, computers, and microwave ovens. General instructions Do not take baths, swim, shower, or use a hot tub until your health care provider approves. You may need to take sponge baths until your health care provider says that you may bathe or  shower. Do not drive until your health care provider approves. Always keep your defibrillator ID card with you. The card should list the implant date, device model, and manufacturer. Consider wearing a medical alert bracelet or necklace that says that you have an S-ICD. Do not use any products that contain nicotine or tobacco, such as cigarettes  and e-cigarettes. If you need help quitting, ask your health care provider. Have your defibrillator checked as often as told by your health care provider. Most S-ICDs last for 4-8 years before they need to be replaced.  Contact a health care provider if: You feel one shock in your chest. You gain weight suddenly. You have a fever. You have severe pain, and medicines do not help. You have redness, swelling, or pain around your incision area. You have pus or a bad smell coming from your incision area. You have fluid or blood coming from your incision. Your incision area feels warm to the touch. Your heart feels like it is fluttering or skipping beats (heart palpitations). You feel increased anxiety or depression.  Get help right away if: You feel more than one shock. You have chest pain. You have problems breathing or have shortness of breath. You have dizziness or fainting.  Summary After the procedure, you may have some pain, see a bump under your skin, and feel the device under your skin. Check your incision area every day for signs of infection. Be careful around electric and magnetic fields. Always keep your defibrillator ID card with you. You should receive this in 6-8 weeks

## 2021-09-14 NOTE — Progress Notes (Signed)
Patient given discharge instructions and stated understanding.

## 2021-09-14 NOTE — Progress Notes (Signed)
Mobility Specialist Progress Note:   09/14/21 1114  Mobility  Activity Ambulated with assistance in hallway  Level of Assistance Standby assist, set-up cues, supervision of patient - no hands on  Assistive Device Front wheel walker  Distance Ambulated (ft) 240 ft  Activity Response Tolerated well  $Mobility charge 1 Mobility   Pt received in bd willing to participate in mobility. Complaints of 10/10 R rib/breast pain. Left in chair with call bell in reach and all needs met.   [facility] Josey Dettmann Mobility Specialist

## 2021-09-14 NOTE — Discharge Summary (Addendum)
ELECTROPHYSIOLOGY PROCEDURE DISCHARGE SUMMARY    Patient ID: Courtney Davies,  MRN: [id number], DOB/AGE: April 25, 1975 46 y.o.  Admit date: 09/13/2021 Discharge date: 09/14/2021  Primary Care Physician: [facility] [place]  Primary Cardiologist: Larae Grooms, MD  Electrophysiologist: Zadkiel Dragan Meredith Leeds, MD   Primary Diagnosis:  Aborted cardiac arrest 11/2020 Ventricular Fibrillation  Allergies  Allergen Reactions   Latex Hives   Other Other (See Comments), Anaphylaxis, Swelling and Hives    Hair glue causes throat to close and hives "glue" Hair glue causes throat to close    Codeine Nausea And Vomiting    Was on an empty stomach.   Nitrofurantoin Rash     Procedures This Admission:  1.  Implantation of a Psychiatrist  on 09/13/2021 by Dr. Curt Bears.  The patient received a A219 model number [employer] S-ICD with model number 5621 S-ICD lead. DFT's were successful at 37 J with impedance of 54 ohms.  There were no immediate post procedure complications. 2.  CXR on 09/13/2021 demonstrated  stable ICD placement   Brief HPI: Courtney Davies is a 46 y.o. female was referred to electrophysiology in the outpatient setting  for consideration of ICD implantation.  Past medical history includes above.  The patient has persistent LV dysfunction despite guideline directed therapy.  Risks, benefits, and alternatives to ICD implantation were reviewed with the patient who wished to proceed.   Hospital Course:  The patient was admitted and underwent implantation of a [employer]  S-ICD  with details as outlined above. She had some bleeding and pain at her primary incision site. They were monitored on telemetry overnight which demonstrated NSR.  Left chest was without hematoma or ecchymosis. With bleeding on her steri-strips, these were place under semi-sterile conditions at the bedside without incident.    The device was interrogated and found to be functioning  normally.  CXR was obtained and demonstrated  stable device placement .  Wound care, arm mobility, and restrictions were reviewed with the patient.  The patient was examined and considered stable for discharge to home.   The patient's discharge medications include an ACE-I/ARB/ARNI (losartan) and beta blocker (coreg).  Anticoagulation resumption This patient is not on anticoagulation.  Physical Exam: Vitals:   09/14/21 0523 09/14/21 0600 09/14/21 0700 09/14/21 0743  BP:    (!) 152/102  Pulse: (!) 59 64 (!) 59 63  Resp: 10 13 15 20   Temp:    97.9 F (36.6 C)  TempSrc:    Oral  SpO2: 100% 98% 97% 100%  Weight:      Height:        GEN- The patient is well appearing, alert and oriented x 3 today.   HEENT: normocephalic, atraumatic; sclera clear, conjunctiva pink; hearing intact; oropharynx clear; neck supple, no JVP Lymph- no cervical lymphadenopathy Lungs- Clear to ausculation bilaterally, normal work of breathing.  No wheezes, rales, rhonchi Heart- Regular rate and rhythm, no murmurs, rubs or gallops, PMI not laterally displaced GI- soft, non-tender, non-distended, bowel sounds present, no hepatosplenomegaly Extremities- no clubbing, cyanosis, or edema; DP/PT/radial pulses 2+ bilaterally MS- no significant deformity or atrophy Skin- warm and dry, no rash or lesion. ICD site with bleeding on original steri-strips. These were replaced at bedside by the PA without incident. Psych- euthymic mood, full affect Neuro- strength and sensation are intact   Labs:   Lab Results  Component Value Date   WBC 6.4 09/08/2021   HGB 13.0 09/08/2021  HCT 43.1 09/08/2021   MCV 77 (L) 09/08/2021   PLT 255 09/08/2021    Recent Labs  Lab 09/08/21 1540  NA 139  K 4.3  CL 103  CO2 22  BUN 18  CREATININE 0.83  CALCIUM 9.2  GLUCOSE 130*    Discharge Medications:  Allergies as of 09/14/2021       Reactions   Latex Hives   Other Other (See Comments), Anaphylaxis, Swelling, Hives   Hair  glue causes throat to close and hives "glue" Hair glue causes throat to close   Codeine Nausea And Vomiting   Was on an empty stomach.   Nitrofurantoin Rash        Medication List     STOP taking these medications    HYDROcodone-acetaminophen 7.5-325 MG tablet Commonly known as: NORCO       TAKE these medications    Accu-Chek Guide Me w/Device Kit SMARTSIG:Via Meter   Accu-Chek Guide test strip Generic drug: glucose blood USE 1 STRIP THREE TIMES DAILY   amiodarone 200 MG tablet Commonly known as: PACERONE Take 1 tablet (200 mg total) by mouth daily.   BD Pen Needle Nano 2nd Gen 32G X 4 MM Misc Generic drug: Insulin Pen Needle 3 (three) times daily. as directed   carvedilol 6.25 MG tablet Commonly known as: COREG Take 1 tablet (6.25 mg total) by mouth 2 (two) times daily with a meal.   divalproex 250 MG DR tablet Commonly known as: Depakote Take 2 tablets (500 mg total) by mouth 3 (three) times daily.   docusate sodium 100 MG capsule Commonly known as: COLACE Take 2 capsules (200 mg total) by mouth daily.   fluticasone furoate-vilanterol 200-25 MCG/ACT Aepb Commonly known as: Breo Ellipta Inhale 1 puff into the lungs daily. What changed:  when to take this reasons to take this   HumaLOG KwikPen 100 UNIT/ML KwikPen Generic drug: insulin lispro 14-15 Units 3 (three) times daily before meals.   INSULIN SYRINGE .5CC/31GX5/16" 31G X 5/16" 0.5 ML Misc 3 (three) times daily.   Lantus SoloStar 100 UNIT/ML Solostar Pen Generic drug: insulin glargine Inject 30 Units into the skin at bedtime.   Linzess 290 MCG Caps capsule Generic drug: linaclotide Take 290 mcg by mouth daily as needed (Constipation).   losartan 25 MG tablet Commonly known as: COZAAR Take 1 tablet (25 mg total) by mouth daily.   meloxicam 7.5 MG tablet Commonly known as: MOBIC Take 7.5 mg by mouth daily.   oxyCODONE-acetaminophen 7.5-325 MG tablet Commonly known as: Percocet Take  1 tablet by mouth every 4 (four) hours as needed for up to 7 days for severe pain.   pantoprazole 40 MG tablet Commonly known as: PROTONIX Take 1 tablet (40 mg total) by mouth 2 (two) times daily. What changed: when to take this   Pfizer COVID-19 Vac Bivalent injection Generic drug: COVID-19 mRNA bivalent vaccine (Pfizer) Inject into the muscle.   sertraline 25 MG tablet Commonly known as: ZOLOFT Take 1 tablet (25 mg total) by mouth daily.   Symbicort 160-4.5 MCG/ACT inhaler Generic drug: budesonide-formoterol Inhale 2 puffs into the lungs in the morning and at bedtime. What changed:  when to take this reasons to take this   Ventolin HFA 108 (90 Base) MCG/ACT inhaler Generic drug: albuterol Inhale 2 puffs into the lungs every 6 (six) hours as needed for wheezing or shortness of breath.        Disposition:    Follow-up Information     [facility] Follow up.   Why: on 6/14 at 0920 for post hospital follow up Contact information: [place] [phone]                Duration of Discharge Encounter: Greater than 30 minutes including physician time.  Signed, Shirley Friar, PA-C  09/14/2021 8:30 AM  I have seen and examined this patient with Oda Kilts.  Agree with above, note added to reflect my findings.  On exam, RRR, no murmurs, lungs clear.  She is now status post [place] for secondary prevention.  Device functioning appropriately.  Chest x-ray and interrogation without issue.  Plan for discharge today with follow-up in device clinic.  Airik Goodlin M. Ason Heslin MD 09/14/2021 10:36 AM

## 2021-09-16 ENCOUNTER — Telehealth: Payer: Self-pay | Admitting: Student

## 2021-09-16 ENCOUNTER — Emergency Department (HOSPITAL_BASED_OUTPATIENT_CLINIC_OR_DEPARTMENT_OTHER)
Admission: EM | Admit: 2021-09-16 | Discharge: 2021-09-16 | Disposition: A | Discharge status: Home / Self Care | Payer: Medicaid Other | Attending: Emergency Medicine | Admitting: Emergency Medicine

## 2021-09-16 ENCOUNTER — Emergency Department (HOSPITAL_BASED_OUTPATIENT_CLINIC_OR_DEPARTMENT_OTHER): Payer: Medicaid Other

## 2021-09-16 ENCOUNTER — Encounter (HOSPITAL_BASED_OUTPATIENT_CLINIC_OR_DEPARTMENT_OTHER): Payer: Self-pay | Admitting: Emergency Medicine

## 2021-09-16 DIAGNOSIS — Z79899 Other long term (current) drug therapy: Secondary | ICD-10-CM | POA: Insufficient documentation

## 2021-09-16 DIAGNOSIS — Z91040 Latex allergy status: Secondary | ICD-10-CM | POA: Insufficient documentation

## 2021-09-16 DIAGNOSIS — Z794 Long term (current) use of insulin: Secondary | ICD-10-CM | POA: Insufficient documentation

## 2021-09-16 DIAGNOSIS — E119 Type 2 diabetes mellitus without complications: Secondary | ICD-10-CM | POA: Insufficient documentation

## 2021-09-16 DIAGNOSIS — G8918 Other acute postprocedural pain: Secondary | ICD-10-CM | POA: Diagnosis not present

## 2021-09-16 MED ORDER — KETOROLAC TROMETHAMINE 30 MG/ML IJ SOLN
30.0000 mg | Freq: Once | INTRAMUSCULAR | Status: AC
Start: 2021-09-16 — End: 2021-09-16
  Administered 2021-09-16: 30 mg via INTRAMUSCULAR
  Filled 2021-09-16: qty 1

## 2021-09-16 NOTE — Telephone Encounter (Signed)
   Patient's sister ([name]) called Answering Service with concerns about swelling around ICD site. Called and spoke with sister (OK per DPR). Sister conferenced me in to patient and another family member.  Patient had a subcutaneous ICD implanted on 09/13/2021.  Patient states she started having pain near her ICD site and swelling on the "backside of the ICD" yesterday but that it has worsened today.  She reports 6/10 pain and family member who was a Chartered certified accountant for years states it looks like she has a hematoma.  This family member states that it is not a little bit of swelling and states she is having significant swelling.  Given this, recommended ICD site be looked at. Unfortunately, the only option at this time is for her to come to the emergency department.  Patient/family voiced understanding and agreed.  Darreld Mclean, PA-C 09/16/2021 3:52 PM

## 2021-09-16 NOTE — Discharge Instructions (Signed)
Chest x-ray was clear, as we discussed you start having really bad short of breath, fevers, spreading redness or warmth at the incision site return back to ED for further evaluation for possible infection.  Otherwise follow-up with the surgeon as planned.

## 2021-09-16 NOTE — ED Provider Notes (Signed)
[facility] EMERGENCY DEPARTMENT Provider Note   CSN: [id number] Arrival date & time: 09/16/21  1617     History  Chief Complaint  Patient presents with   Post-op Problem    Courtney Davies is a 46 y.o. female.  HPI  Patient with medical history of diabetes, and, of lipidemia, cardiac arrest status post defibrillator implant 5 days ago presents today due to postop complication.  She states she is having pain at the surgical incision site.  There is also bruising to the left flank surrounding the incision site.  This is worse with movement, she denies any fevers at home.  No chills.  Home Medications Prior to Admission medications   Medication Sig Start Date End Date Taking? Authorizing Provider  ACCU-CHEK GUIDE test strip USE 1 [place] DAILY 08/03/21   [provider]  albuterol (VENTOLIN HFA) 108 (90 Base) MCG/ACT inhaler Inhale 2 puffs into the lungs every 6 (six) hours as needed for wheezing or shortness of breath. 01/24/21   Love, Ivan Anchors, PA-C  amiodarone (PACERONE) 200 MG tablet Take 1 tablet (200 mg total) by mouth daily. 01/25/21   Jettie Booze, MD  BD PEN NEEDLE NANO 2ND GEN 32G X 4 MM MISC 3 (three) times daily. as directed 07/11/21   [provider]  Blood Glucose Monitoring Suppl (ACCU-CHEK GUIDE ME) w/Device KIT SMARTSIG:Via Meter 08/02/21   [provider]  carvedilol (COREG) 6.25 MG tablet Take 1 tablet (6.25 mg total) by mouth 2 (two) times daily with a meal. 01/24/21   Love, Ivan Anchors, PA-C  COVID-19 mRNA bivalent vaccine, Pfizer, (PFIZER COVID-19 VAC BIVALENT) injection Inject into the muscle. 08/23/21   Carlyle Basques, MD  divalproex (DEPAKOTE) 250 MG DR tablet Take 2 tablets (500 mg total) by mouth 3 (three) times daily. 03/08/21   Meredith Staggers, MD  docusate sodium (COLACE) 100 MG capsule Take 2 capsules (200 mg total) by mouth daily. 01/24/21   Love, Ivan Anchors, PA-C  fluticasone furoate-vilanterol (BREO  ELLIPTA) 200-25 MCG/ACT AEPB Inhale 1 puff into the lungs daily. Patient taking differently: Inhale 1 puff into the lungs daily as needed (Breathing). 05/12/21   Icard, Octavio Graves, DO  HUMALOG KWIKPEN 100 UNIT/ML KwikPen 14-15 Units 3 (three) times daily before meals. 08/02/21   [provider]  Insulin Syringe-Needle U-100 (INSULIN SYRINGE .5CC/31GX5/16") 31G X 5/16" 0.5 ML MISC 3 (three) times daily. 07/12/21   [provider]  LANTUS SOLOSTAR 100 UNIT/ML Solostar Pen Inject 30 Units into the skin at bedtime. 08/03/21   [provider]  LINZESS 290 MCG CAPS capsule Take 290 mcg by mouth daily as needed (Constipation). 03/01/21   [provider]  losartan (COZAAR) 25 MG tablet Take 1 tablet (25 mg total) by mouth daily. 01/24/21   Love, Ivan Anchors, PA-C  meloxicam (MOBIC) 7.5 MG tablet Take 7.5 mg by mouth daily. 04/21/21   [provider]  oxyCODONE-acetaminophen (PERCOCET) 7.5-325 MG tablet Take 1 tablet by mouth every 4 (four) hours as needed for up to 7 days for severe pain. 09/14/21 09/21/21  Shirley Friar, PA-C  pantoprazole (PROTONIX) 40 MG tablet Take 1 tablet (40 mg total) by mouth 2 (two) times daily. Patient taking differently: Take 40 mg by mouth at bedtime. 01/24/21   Love, Ivan Anchors, PA-C  sertraline (ZOLOFT) 25 MG tablet Take 1 tablet (25 mg total) by mouth daily. 07/04/21   Reed, Tiffany L, DO  SYMBICORT 160-4.5 MCG/ACT inhaler Inhale 2  puffs into the lungs in the morning and at bedtime. Patient taking differently: Inhale 2 puffs into the lungs daily as needed (Breathing). 06/23/21   Garner Nash, DO      Allergies    Latex, Other, Codeine, and Nitrofurantoin    Review of Systems   Review of Systems  Physical Exam Updated Vital Signs BP (!) 146/81   Pulse 74   Temp 97.9 F (36.6 C) (Oral)   Resp 16   Ht 5' 5.5" (1.664 m)   Wt 112 kg   LMP 12/03/2012   SpO2 99%   BMI 40.46 kg/m  Physical Exam Vitals and nursing note  reviewed. Exam conducted with a chaperone present.  Constitutional:      Appearance: Normal appearance.  HENT:     Head: Normocephalic and atraumatic.  Eyes:     General: No scleral icterus.       Right eye: No discharge.        Left eye: No discharge.     Extraocular Movements: Extraocular movements intact.     Pupils: Pupils are equal, round, and reactive to light.  Cardiovascular:     Rate and Rhythm: Normal rate and regular rhythm.     Pulses: Normal pulses.     Heart sounds: Normal heart sounds. No murmur heard.   No friction rub. No gallop.  Pulmonary:     Effort: Pulmonary effort is normal. No respiratory distress.     Breath sounds: Normal breath sounds.  Abdominal:     General: Abdomen is flat. Bowel sounds are normal. There is no distension.     Palpations: Abdomen is soft.     Tenderness: There is no abdominal tenderness.  Skin:    General: Skin is warm and dry.     Coloration: Skin is not jaundiced.     Findings: Bruising present.     Comments: Patient along the incision site, no warmth or erythema.  No purulent discharge  Neurological:     Mental Status: She is alert. Mental status is at baseline.     Coordination: Coordination normal.    ED Results / Procedures / Treatments   Labs (all labs ordered are listed, but only abnormal results are displayed) Labs Reviewed - No data to display  EKG None  Radiology DG Chest 2 View  Result Date: 09/16/2021 CLINICAL DATA:  Postop pain. Defibrillator placed 5/31 with bruising. EXAM: CHEST - 2 VIEW COMPARISON:  Radiograph 09/13/2021 FINDINGS: Defibrillator with lead in the anterior soft tissues and battery pack overlying the left chest wall. Small focus of gas just above the battery pack in the soft tissues. The heart is normal in size. Normal mediastinal contours. No focal airspace disease, pleural effusion, pulmonary edema or pneumothorax. Stable osseous structures. IMPRESSION: Defibrillator with lead in the anterior soft  tissues and battery pack overlying the left chest wall. Small focus of gas just above the battery pack in the soft tissues is likely related to the recent placement. No acute intrathoracic abnormalities. Electronically Signed   By: Keith Rake M.D.   On: 09/16/2021 18:04    Procedures Procedures    Medications Ordered in ED Medications - No data to display  ED Course/ Medical Decision Making/ A&P                           Medical Decision Making Amount and/or Complexity of Data Reviewed Radiology: ordered.   Patient presents due to pain and  bruising along the incision site status post defibrillator implant.  She is accompanied by her aunt.  Differential includes not limited to sepsis, left lung, hematoma, return to postop complication.  Patient on exam is not hypoxic, does not meet SIRS criteria.  There is no fever, no overlying cellulitis.  Lung sounds are present bilaterally.  I ordered and reviewed the chest x-ray which shows area secondary to the operation.  There is no acute process noted.  On reevaluation patient is doing well.  Considered additional work-up but does not meet SIRS criteria and have a low suspicion for any postop infection.  We will have her follow-up with surgeon in 2 weeks as planned, we discussed return precautions and patient is discharged in stable condition.  Discussed HPI, physical exam and plan of care for this patient with attending Lajean Saver. The attending physician evaluated this patient as part of a shared visit and agrees with plan of care.         Final Clinical Impression(s) / ED Diagnoses Final diagnoses:  None    Rx / DC Orders ED Discharge Orders     None         Sherrill Raring, Hershal Coria 09/16/21 Neomia Glass, MD 09/16/21 2130

## 2021-09-16 NOTE — ED Triage Notes (Signed)
Pt had defibrillator installed on 5/31 large bruised area on left flank with swelling and pain.

## 2021-09-25 ENCOUNTER — Ambulatory Visit (INDEPENDENT_AMBULATORY_CARE_PROVIDER_SITE_OTHER): Payer: Medicaid Other | Admitting: Pulmonary Disease

## 2021-09-25 ENCOUNTER — Encounter: Payer: Self-pay | Admitting: Pulmonary Disease

## 2021-09-25 VITALS — BP 136/72 | HR 74 | Temp 97.8°F | Ht 65.0 in | Wt 251.0 lb

## 2021-09-25 DIAGNOSIS — J4520 Mild intermittent asthma, uncomplicated: Secondary | ICD-10-CM | POA: Diagnosis not present

## 2021-09-25 MED ORDER — FLUTICASONE FUROATE-VILANTEROL 200-25 MCG/ACT IN AEPB: 1.0000 | INHALATION_SPRAY | Freq: Every day | RESPIRATORY_TRACT | 3 refills | Status: DC

## 2021-09-25 NOTE — Patient Instructions (Addendum)
Thank you for visiting Dr. Valeta Harms at [facility]. Today we recommend the following:  Orders Placed This Encounter  Procedures   Pulmonary Function Test   Meds ordered this encounter  Medications   fluticasone furoate-vilanterol (BREO ELLIPTA) 200-25 MCG/ACT AEPB    Sig: Inhale 1 puff into the lungs daily.    Dispense:  90 each    Refill:  3   Return in about 1 year (around 09/26/2022) for with APP or Dr. Valeta Harms.    Please do your part to reduce the spread of COVID-19.

## 2021-09-25 NOTE — Progress Notes (Signed)
Synopsis: Referred in January 2023 for cough congestion, asthma by [facility]  Subjective:   PATIENT ID: Courtney Davies GENDER: female DOB: 01-20-1976, MRN: [id number]  Chief Complaint  Patient presents with   Follow-up    Follow up.     This is a 46 year old female, past medical history of asthma, GERD, hypertension, former tobacco abuse.  In August 2023 the patient suffered an out of the hospital cardiac arrest, ventricular fibrillation,Patient spent nearly 2-1/2 months in the hospital she had a brain MRI it with diffuse hypoxic ischemic injury, ultimately due to the poor neurologic recovery postarrest decision was made to extubate and see how she did.  Ultimately she had trouble swallowing with a core track and a PEG tube was placed.  She spent time in rehabilitation.  Ultimately has had a miraculous recovery.  She is fully functional able to complete all of her activities of daily living drove herself here today to the office.  Her memory is still foggy.  She still has difficulty remembering what occurred prior to her hospitalization.  She does not remember much about being in the hospital.  Overall tearful and thankful for the care she received while she was there and the fact that she is still alive today.  As for her cough she has had this for a long time.  It used to be worse when she quit smoking.  She had asthma before.  Was on inhalers.  She does notice improvement with albuterol.  No real triggers that she knows of.  Occasional allergies.  OV 06/22/2021: Here today for follow-up.  Unfortunately she did not have her pulmonary function test completed prior to today's visit however we can review the fact that she was recently started on Breo.  Her cough and shortness of breath has completely resolved since starting Breo.  Her asthma is much better controlled.  OV 09/25/2021: Patient seen today for follow-up.  She still has not had her pulmonary function test completed.  These  were ordered in January 2023.  I will work with our office to try to figure out what the issue is on getting these scheduled for the patient.  She also has some difficulties remembering things so it makes me wonder if they have tried to reach out to her and get it scheduled or if she has been is just unable to get this done.  She does really well on the [place].  She would like to stay on this.  She is having no significant asthma symptoms at this time.    Past Medical History:  Diagnosis Date   Anemia 09/2015   Anemia    Arthritis    "knees" (04/23/2017)   Asthma    "teens; went away; came back" (04/23/2017)   B12 deficiency anemia 04/23/2017   Chronic bronchitis (HCC)    Chronic low back pain with sciatica    Chronic lower back pain    Diabetes mellitus without complication ([place])    Diabetes type 2, controlled ([place])    Elevated ferritin level    Fatty liver    GERD (gastroesophageal reflux disease)    GERD with stricture    Headache    "1-2/wk" (04/23/2017)   History of blood transfusion "plenty"   "related to anemia" (04/23/2017)   Hypertension    Inguinal hernia    Low back pain    Migraine    "1-2/month" (04/23/2017)   OA (osteoarthritis) of knee--left    Symptomatic anemia 04/23/2017  Vitamin B 12 deficiency      Family History  Problem Relation Age of Onset   Stroke Mother    Diabetes Mother    Diabetes Father    Stroke Maternal Grandmother    Diabetes Maternal Grandmother    Anemia Paternal Grandmother    Valvular heart disease Paternal Grandmother    Hypertension Mother    Prostate cancer Maternal Uncle        ? intestinal also     Past Surgical History:  Procedure Laterality Date   ABDOMINAL HYSTERECTOMY  YRS AGO   COMPLETE   CESAREAN SECTION  1998; 2002   ESOPHAGEAL DILATION  02/2020   by Dr Patrcia Dolly HERNIA REPAIR Bilateral 1980s    "total of 4 surgeries" (04/23/2017)   IR GASTROSTOMY TUBE MOD SED  12/13/2020   KNEE ARTHROSCOPY WITH MEDIAL  MENISECTOMY Left 01/30/2018   Procedure: LEFT KNEE ARTHROSCOPY WITH PARTIAL LATERAL MENISCECTOMY;  Surgeon: Mcarthur Rossetti, MD;  Location: [facility];  Service: Orthopedics;  Laterality: Left;   KNEE CARTILAGE SURGERY Left    SUBQ ICD IMPLANT N/A 09/13/2021   Procedure: SUBQ ICD IMPLANT;  Surgeon: Constance Haw, MD;  Location: [facility];  Service: Cardiovascular;  Laterality: N/A;   TUBAL LIGATION  2002    Social History   Socioeconomic History   Marital status: Single    Spouse name: Not on file   Number of children: 2   Years of education: 11th   Highest education level: Not on file  Occupational History   Occupation: salad maker in restaurant  Tobacco Use   Smoking status: Former    Types: Cigarettes   Smokeless tobacco: Never  Vaping Use   Vaping Use: Never used  Substance and Sexual Activity   Alcohol use: Not Currently    Comment: occ   Drug use: Yes    Types: Marijuana   Sexual activity: Not Currently    Birth control/protection: Surgical  Other Topics Concern   Not on file  Social History Narrative   ** Merged History Encounter **       Lives at home with her two children. Occasional caffeine use. Right-handed.   Social Determinants of Health   Financial Resource Strain: Not on file  Food Insecurity: Not on file  Transportation Needs: Not on file  Physical Activity: Not on file  Stress: Not on file  Social Connections: Not on file  Intimate Partner Violence: Not on file     Allergies  Allergen Reactions   Latex Hives   Other Other (See Comments), Anaphylaxis, Swelling and Hives    Hair glue causes throat to close and hives "glue" Hair glue causes throat to close    Codeine Nausea And Vomiting    Was on an empty stomach.   Nitrofurantoin Rash     Outpatient Medications Prior to Visit  Medication Sig Dispense Refill   ACCU-CHEK GUIDE test strip USE 1 STRIP THREE TIMES DAILY     albuterol (VENTOLIN HFA) 108 (90 Base) MCG/ACT  inhaler Inhale 2 puffs into the lungs every 6 (six) hours as needed for wheezing or shortness of breath. 18 g 2   amiodarone (PACERONE) 200 MG tablet Take 1 tablet (200 mg total) by mouth daily. 90 tablet 3   BD PEN NEEDLE NANO 2ND GEN 32G X 4 MM MISC 3 (three) times daily. as directed     Blood Glucose Monitoring Suppl (ACCU-CHEK GUIDE ME) w/Device KIT SMARTSIG:Via Meter  carvedilol (COREG) 6.25 MG tablet Take 1 tablet (6.25 mg total) by mouth 2 (two) times daily with a meal. 60 tablet 0   COVID-19 mRNA bivalent vaccine, Pfizer, (PFIZER COVID-19 VAC BIVALENT) injection Inject into the muscle. 0.3 mL 0   divalproex (DEPAKOTE) 250 MG DR tablet Take 2 tablets (500 mg total) by mouth 3 (three) times daily. 180 tablet 0   docusate sodium (COLACE) 100 MG capsule Take 2 capsules (200 mg total) by mouth daily. 60 capsule 0   fluticasone furoate-vilanterol (BREO ELLIPTA) 200-25 MCG/ACT AEPB Inhale 1 puff into the lungs daily. (Patient taking differently: Inhale 1 puff into the lungs daily as needed (Breathing).) 60 each 0   HUMALOG KWIKPEN 100 UNIT/ML KwikPen 14-15 Units 3 (three) times daily before meals.     Insulin Syringe-Needle U-100 (INSULIN SYRINGE .5CC/31GX5/16") 31G X 5/16" 0.5 ML MISC 3 (three) times daily.     LANTUS SOLOSTAR 100 UNIT/ML Solostar Pen Inject 30 Units into the skin at bedtime.     LINZESS 290 MCG CAPS capsule Take 290 mcg by mouth daily as needed (Constipation).     losartan (COZAAR) 25 MG tablet Take 1 tablet (25 mg total) by mouth daily. 30 tablet 0   meloxicam (MOBIC) 7.5 MG tablet Take 7.5 mg by mouth daily.     pantoprazole (PROTONIX) 40 MG tablet Take 1 tablet (40 mg total) by mouth 2 (two) times daily. (Patient taking differently: Take 40 mg by mouth at bedtime.) 60 tablet 0   sertraline (ZOLOFT) 25 MG tablet Take 1 tablet (25 mg total) by mouth daily. 30 tablet 1   SYMBICORT 160-4.5 MCG/ACT inhaler Inhale 2 puffs into the lungs in the morning and at bedtime. (Patient  taking differently: Inhale 2 puffs into the lungs daily as needed (Breathing).) 1 each 11   No facility-administered medications prior to visit.    Review of Systems  Constitutional:  Negative for chills, fever, malaise/fatigue and weight loss.  HENT:  Negative for hearing loss, sore throat and tinnitus.   Eyes:  Negative for blurred vision and double vision.  Respiratory:  Positive for cough and shortness of breath. Negative for hemoptysis, sputum production, wheezing and stridor.        She usually has nocturnal cough  Cardiovascular:  Negative for chest pain, palpitations, orthopnea, leg swelling and PND.  Gastrointestinal:  Negative for abdominal pain, constipation, diarrhea, heartburn, nausea and vomiting.  Genitourinary:  Negative for dysuria, hematuria and urgency.  Musculoskeletal:  Negative for joint pain and myalgias.  Skin:  Negative for itching and rash.  Neurological:  Negative for dizziness, tingling, weakness and headaches.  Endo/Heme/Allergies:  Negative for environmental allergies. Does not bruise/bleed easily.  Psychiatric/Behavioral:  Negative for depression. The patient is not nervous/anxious and does not have insomnia.   All other systems reviewed and are negative.    Objective:  Physical Exam Vitals reviewed.  Constitutional:      General: She is not in acute distress.    Appearance: She is well-developed.  HENT:     Head: Normocephalic and atraumatic.  Eyes:     General: No scleral icterus.    Conjunctiva/sclera: Conjunctivae normal.     Pupils: Pupils are equal, round, and reactive to light.  Neck:     Vascular: No JVD.     Trachea: No tracheal deviation.  Cardiovascular:     Rate and Rhythm: Normal rate and regular rhythm.     Heart sounds: Normal heart sounds. No murmur heard. Pulmonary:  Effort: Pulmonary effort is normal. No tachypnea, accessory muscle usage or respiratory distress.     Breath sounds: No stridor. No wheezing, rhonchi or rales.   Abdominal:     General: There is no distension.     Palpations: Abdomen is soft.     Tenderness: There is no abdominal tenderness.  Musculoskeletal:        General: No tenderness.     Cervical back: Neck supple.  Lymphadenopathy:     Cervical: No cervical adenopathy.  Skin:    General: Skin is warm and dry.     Capillary Refill: Capillary refill takes less than 2 seconds.     Findings: No rash.  Neurological:     Mental Status: She is alert and oriented to person, place, and time.  Psychiatric:        Behavior: Behavior normal.      Vitals:   09/25/21 1016  BP: 136/72  Pulse: 74  Temp: 97.8 F (36.6 C)  TempSrc: Oral  SpO2: 99%  Weight: 251 lb (113.9 kg)  Height: 5' 5"  (1.651 m)   99% on RA BMI Readings from Last 3 Encounters:  09/25/21 41.77 kg/m  09/16/21 40.46 kg/m  09/14/21 40.53 kg/m   Wt Readings from Last 3 Encounters:  09/25/21 251 lb (113.9 kg)  09/16/21 246 lb 14.6 oz (112 kg)  09/14/21 247 lb 4.8 oz (112.2 kg)     CBC    Component Value Date/Time   WBC 6.4 09/08/2021 1540   WBC 6.5 06/19/2021 0943   RBC 5.61 (H) 09/08/2021 1540   RBC 6.16 (H) 06/19/2021 0943   HGB 13.0 09/08/2021 1540   HCT 43.1 09/08/2021 1540   PLT 255 09/08/2021 1540   MCV 77 (L) 09/08/2021 1540   MCH 23.2 (L) 09/08/2021 1540   MCH 22.9 (L) 06/19/2021 0943   MCHC 30.2 (L) 09/08/2021 1540   MCHC 31.6 06/19/2021 0943   RDW 14.0 09/08/2021 1540   LYMPHSABS 2.3 01/16/2021 1043   LYMPHSABS 2.8 12/19/2017 0907   MONOABS 0.6 01/16/2021 1043   EOSABS 0.4 01/16/2021 1043   EOSABS 0.2 12/19/2017 0907   BASOSABS 0.0 01/16/2021 1043   BASOSABS 0.0 12/19/2017 0907    Chest Imaging:  Chest x-ray 12/18/2020: No acute abnormality. The patient's images have been independently reviewed by me.    Cardiac MRI:  IMPRESSION: 1. Normal LV size, mild hypertrophy, and low normal systolic function (EF 63%). Septal bounc 2.  Normal RV size and systolic function (EF 81%) 3.  No  late gadolinium enhancement 4.  Mild mitral regurgitation (regurgitant fraction 11%)  Pulmonary Functions Testing Results:    Latest Ref Rng & Units 01/15/2018   10:46 AM  PFT Results  FVC-Pre L 3.15   FVC-Predicted Pre % 96   FVC-Post L 3.19   FVC-Predicted Post % 97   Pre FEV1/FVC % % 74   Post FEV1/FCV % % 74   FEV1-Pre L 2.32   FEV1-Predicted Pre % 86   FEV1-Post L 2.37   DLCO uncorrected ml/min/mmHg 21.50   DLCO UNC% % 79   DLCO corrected ml/min/mmHg 21.77   DLCO COR %Predicted % 80   DLVA Predicted % 96   TLC L 5.02   TLC % Predicted % 93   RV % Predicted % 111     FeNO:   Pathology:   Echocardiogram:   Heart Catheterization:     Assessment & Plan:     ICD-10-CM   1. Mild intermittent asthma  without complication  Z85.88       Discussion:  This is a 46 year old female, mild intermittent asthma doing well on Breo Ellipta  Plan: Once again we will try to ensure that she has pulmonary function test complete. Continue maintenance inhaler with Breo Ellipta. Continue albuterol as needed Was given financial aid application at last office visit. If she needs this again we will happy to help with assistance. She has been able to get her medications through [facility].  Patient to follow-up with [place] as needed or in 1 year. Hopefully get her PFTs done in the next couple of weeks and will let her know if there is anything that we need to change management based on.    Current Outpatient Medications:    ACCU-CHEK GUIDE test strip, USE 1 STRIP THREE TIMES DAILY, Disp: , Rfl:    albuterol (VENTOLIN HFA) 108 (90 Base) MCG/ACT inhaler, Inhale 2 puffs into the lungs every 6 (six) hours as needed for wheezing or shortness of breath., Disp: 18 g, Rfl: 2   amiodarone (PACERONE) 200 MG tablet, Take 1 tablet (200 mg total) by mouth daily., Disp: 90 tablet, Rfl: 3   BD PEN NEEDLE NANO 2ND GEN 32G X 4 MM MISC, 3 (three) times daily. as directed, Disp: , Rfl:    Blood Glucose  Monitoring Suppl (ACCU-CHEK GUIDE ME) w/Device KIT, SMARTSIG:Via Meter, Disp: , Rfl:    carvedilol (COREG) 6.25 MG tablet, Take 1 tablet (6.25 mg total) by mouth 2 (two) times daily with a meal., Disp: 60 tablet, Rfl: 0   COVID-19 mRNA bivalent vaccine, Pfizer, (PFIZER COVID-19 VAC BIVALENT) injection, Inject into the muscle., Disp: 0.3 mL, Rfl: 0   divalproex (DEPAKOTE) 250 MG DR tablet, Take 2 tablets (500 mg total) by mouth 3 (three) times daily., Disp: 180 tablet, Rfl: 0   docusate sodium (COLACE) 100 MG capsule, Take 2 capsules (200 mg total) by mouth daily., Disp: 60 capsule, Rfl: 0   fluticasone furoate-vilanterol (BREO ELLIPTA) 200-25 MCG/ACT AEPB, Inhale 1 puff into the lungs daily. (Patient taking differently: Inhale 1 puff into the lungs daily as needed (Breathing).), Disp: 60 each, Rfl: 0   fluticasone furoate-vilanterol (BREO ELLIPTA) 200-25 MCG/ACT AEPB, Inhale 1 puff into the lungs daily., Disp: 90 each, Rfl: 3   HUMALOG KWIKPEN 100 UNIT/ML KwikPen, 14-15 Units 3 (three) times daily before meals., Disp: , Rfl:    Insulin Syringe-Needle U-100 (INSULIN SYRINGE .5CC/31GX5/16") 31G X 5/16" 0.5 ML MISC, 3 (three) times daily., Disp: , Rfl:    LANTUS SOLOSTAR 100 UNIT/ML Solostar Pen, Inject 30 Units into the skin at bedtime., Disp: , Rfl:    LINZESS 290 MCG CAPS capsule, Take 290 mcg by mouth daily as needed (Constipation)., Disp: , Rfl:    losartan (COZAAR) 25 MG tablet, Take 1 tablet (25 mg total) by mouth daily., Disp: 30 tablet, Rfl: 0   meloxicam (MOBIC) 7.5 MG tablet, Take 7.5 mg by mouth daily., Disp: , Rfl:    pantoprazole (PROTONIX) 40 MG tablet, Take 1 tablet (40 mg total) by mouth 2 (two) times daily. (Patient taking differently: Take 40 mg by mouth at bedtime.), Disp: 60 tablet, Rfl: 0   sertraline (ZOLOFT) 25 MG tablet, Take 1 tablet (25 mg total) by mouth daily., Disp: 30 tablet, Rfl: 1   SYMBICORT 160-4.5 MCG/ACT inhaler, Inhale 2 puffs into the lungs in the morning and at  bedtime. (Patient taking differently: Inhale 2 puffs into the lungs daily as needed (Breathing).), Disp: 1 each, Rfl:  [facility] 09/25/2021 10:27 AM

## 2021-09-27 ENCOUNTER — Ambulatory Visit (INDEPENDENT_AMBULATORY_CARE_PROVIDER_SITE_OTHER): Payer: Medicaid Other

## 2021-09-27 DIAGNOSIS — I469 Cardiac arrest, cause unspecified: Secondary | ICD-10-CM

## 2021-09-27 LAB — CUP PACEART INCLINIC DEVICE CHECK
Date Time Interrogation Session: 20230614114510
Implantable Lead Implant Date: 20230531
Implantable Lead Location: 753862
Implantable Lead Model: 3501
Implantable Lead Serial Number: 235497
Implantable Pulse Generator Implant Date: 20230531
Pulse Gen Serial Number: 177788

## 2021-09-27 NOTE — Patient Instructions (Signed)
   After Your ICD (Implantable Cardiac Defibrillator)    Monitor your defibrillator site for redness, swelling, and drainage. Call the device clinic at [phone] if you experience these symptoms or fever/chills.  If you see increased swelling, please call the device clinic.  You may shower starting this Saturday 09/30/2021.   Do not lift, push or pull greater than 10 pounds with the affected arm until 6 weeks after your procedure. There are no other restrictions in arm movement after your wound check appointment.   Your ICD is designed to protect you from life threatening heart rhythms. Because of this, you may receive a shock.   1 shock with no symptoms:  Call the office during business hours. 1 shock with symptoms (chest pain, chest pressure, dizziness, lightheadedness, shortness of breath, overall feeling unwell):  Call 911. If you experience 2 or more shocks in 24 hours:  Call 911. If you receive a shock, you should not drive.  [place] DMV - no driving for 6 months if you receive appropriate therapy from your ICD.   ICD Alerts:  Some alerts are vibratory and others beep. These are NOT emergencies. Please call our office to let us know. If this occurs at night or on weekends, it can wait until the next business day. Send a remote transmission.  If your device is capable of reading fluid status (for heart failure), you will be offered monthly monitoring to review this with you.   Remote monitoring is used to monitor your ICD from home. This monitoring is scheduled every 91 days by our office. It allows [place] to keep an eye on the functioning of your device to ensure it is working properly. You will routinely see your Electrophysiologist annually (more often if necessary).

## 2021-09-27 NOTE — Progress Notes (Signed)
Subcutaneous ICD check in clinic. 0 untreated episodes; 0 treated episodes; 0 shocks delivered. Electrode impedance status okay. No programming changes. Remaining longevity to ERI 100%.  Steri-strips removed and edges are well approximated. No redness, draining or bleeding noted. Hematoma noted. Dr. Lovena Le in to assess and would like to have patient continue to monitor and come back in 2 weeks to have wound recheck by WC. Patient may shower starting Saturday 09/30/21 per GT. Wound care and shower education provided to patient with verbal understanding.   Error in saving report. See scanned report.

## 2021-09-28 ENCOUNTER — Telehealth: Payer: Self-pay | Admitting: Pharmacy Technician

## 2021-09-28 NOTE — Telephone Encounter (Signed)
Received notification from [facility] regarding a prior authorization for  Flutic/Vilan (Breo) 200-25 . Authorization has been APPROVED from 09/27/21 to 09/28/22.   Authorization # [id number]

## 2021-10-02 ENCOUNTER — Ambulatory Visit (INDEPENDENT_AMBULATORY_CARE_PROVIDER_SITE_OTHER): Payer: Medicaid Other | Admitting: Cardiology

## 2021-10-02 ENCOUNTER — Telehealth: Payer: Self-pay | Admitting: Cardiology

## 2021-10-02 DIAGNOSIS — I469 Cardiac arrest, cause unspecified: Secondary | ICD-10-CM

## 2021-10-02 NOTE — Telephone Encounter (Signed)
Apt made today in [place] @ 1:30

## 2021-10-02 NOTE — Telephone Encounter (Signed)
  1. Has your device fired? no  2. Is you device beeping? no  3. Are you experiencing draining or swelling at device site? Swelling and pain at the wound  4. Are you calling to see if we received your device transmission? no  5. Have you passed out? no  Patient states since her procedure she still has pain and swelling on her wound site.   Please route to [place]

## 2021-10-02 NOTE — Telephone Encounter (Signed)
Patient reports hematoma is warmth to touch. States she is not able to look at it due to location and does not have a mirror low enough to see if redness or drainage. Patient has device clinic apt next week but concerned for infection. Reached out to RN who will be at [place] in Dr. Curt Bears clinic to see if she can come there for wound check since she lives in [place]. If not, I will schedule patient for device clinic apt 10/03/21 in [place]. Patient was agreeable and advised I will reach back out to her once I know something.

## 2021-10-02 NOTE — Telephone Encounter (Signed)
Successful telephone encounter to patient who reports ongoing hematoma at [facility] site. She is requesting to be seen in [facility] clinic today. Nurse visit scheduled for today at 1:30 for wound check.

## 2021-10-03 ENCOUNTER — Encounter: Payer: Self-pay | Admitting: Internal Medicine

## 2021-10-03 ENCOUNTER — Other Ambulatory Visit: Payer: Medicaid Other | Admitting: Internal Medicine

## 2021-10-03 VITALS — BP 142/70 | HR 68 | Temp 98.2°F | Resp 16 | Wt 252.0 lb

## 2021-10-03 DIAGNOSIS — E1165 Type 2 diabetes mellitus with hyperglycemia: Secondary | ICD-10-CM

## 2021-10-03 DIAGNOSIS — F321 Major depressive disorder, single episode, moderate: Secondary | ICD-10-CM

## 2021-10-03 DIAGNOSIS — Z95810 Presence of automatic (implantable) cardiac defibrillator: Secondary | ICD-10-CM

## 2021-10-03 DIAGNOSIS — Z515 Encounter for palliative care: Secondary | ICD-10-CM

## 2021-10-03 DIAGNOSIS — G931 Anoxic brain damage, not elsewhere classified: Secondary | ICD-10-CM

## 2021-10-03 DIAGNOSIS — I4901 Ventricular fibrillation: Secondary | ICD-10-CM

## 2021-10-03 NOTE — Progress Notes (Signed)
Patient presented to [place] clinic for acute visit secondary to complaint of growing hematoma and pain at [facility] implant site. Large firm hematoma slightly larger than a softball. Assessed by Dr. Curt Bears who agrees patient will require pressure dressing and follow up appointment next week to assess. No OAC. Wound check 10/10/21 at 9:00am.

## 2021-10-03 NOTE — Progress Notes (Signed)
[facility] Pall[facility] Care Follow-Up Visit Telephone: [phone]  Fax: [phone]   Date of encounter: 10/03/21 9:25 AM PATIENT NAME: Courtney Davies [address] [place] [address]   [phone] (home)  DOB: 1976/02/17 MRN: [id number] PRIMARY CARE PROVIDER:    Center, [facility],  [address] [place] [address] [phone]  REFERRING PROVIDER:   Center, [facility] [address] [place],  [place] [address] [phone]  RESPONSIBLE PARTY:    Contact Information     Name Relation Home Work [place] Sister   [phone]   Breta, Demedeiros Mother [phone]  [phone]   Lalone,[name] Sister [phone]  [phone]   Nix,[name] Father   [phone]        I met face to face with patient and family in her mother's home. Palliative Care was asked to follow this patient by consultation request of  Center, [place] to address advance care planning and complex medical decision making. This is follow-up visit.                                     ASSESSMENT AND PLAN / RECOMMENDATIONS:   Advance Care Planning/Goals of Care: Goals include to maximize quality of life and symptom management. Patient/health care surrogate gave his/her permission to discuss.Our advance care planning conversation included a discussion about:    The value and importance of advance care planning  Experiences with loved ones who have been seriously ill or have died  Exploration of personal, cultural or spiritual beliefs that might influence medical decisions  Exploration of goals of care in the event of a sudden injury or illness  Identification  of a healthcare agent  Review and updating or creation of an  advance directive document . Decision not to resuscitate or to de-escalate disease focused treatments due to poor prognosis. CODE STATUS:  FULL CODE  Symptom Management/Plan: 1. Current moderate episode of  major depressive disorder without prior episode ([place]) -doing better with low dose zoloft -encouraged regular exercise with walking, as well  2. Anoxic brain injury ([place]) -continues to struggle with appts -today, I spent much of visit getting her mychart from [facility] merged with her [facility] account so she could see all of her appts in the two systems -is back to staying in her own home now -reportedly taking her medications as prescribed   3. 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 ICD (implantable cardioverter-defibrillator) in place -Dr. Curt Bears placed device; she is struggling with a large hematoma now in midaxillary line on the left--compression wrap being used -she is to f/u as scheduled--no fevers or signs of infection  5. Controlled type 2 diabetes mellitus with hyperglycemia, without long-term current use of insulin ([place]) -encouraged regular exercise -suggested she see a dietitian to help her with food choices--perhaps some written materials she could refer to with her poor memory  6. Palliative care by specialist -pt has gotten used to visits to help address her multiple issues and keep her appts straight -ideally, if we can get all of her providers connected, she does not truly need palliative care follow-up given aggressive goals  Follow up Palliative Care Visit: Palliative care will continue to follow for complex medical decision making, advance care planning, and clarification of goals. Return 11/23/2021 and prn.  This visit was coded based on medical  decision making (MDM).  PPS: 50%  HOSPICE ELIGIBILITY/DIAGNOSIS: not eligible  Chief Complaint: Follow-up palliative visit  HISTORY OF PRESENT ILLNESS:  Courtney Davies is a 46 y.o. 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  She has gotten in with several specialists now including Dr. Valeta Harms from pulmonary, Dr. Curt Bears has placed her ICD on 5/31, and she's also seen Dr. Encarnacion Chu from [facility].  We were trying to get her care all within one system so it was easier for her to keep track of her appts due to her cognitive losses--her sister has been helping with these and pt has been living with her mother though she had spent some time in her own home when I saw her last time.    She had to stay overnight after her ICD was placed due to bleeding.  She was ok at her f/u, but then developed swelling and burning on left side/midaxillary area for which she went to the ED on 6/3--she's been told at an acute visit yesterday in [place] that this was a hematoma. She has compression dressings wrapped around her and is not permitted to shower again.  She's had no fever or chills.  Next week, she follows up again with Dr. Curt Bears.    DMII:  she has not been eating sweets, has not been drinking sodas, is drinking water instead.  She'd like to get the freestyle libre.  Discussed checking tid for a month and then this device could potentially be covered.  She's cut out chips.  She doesn't want to be on insulin.  She was able to walk the first day after her surgery, but has not since.    She fell yesterday trying to get up off the chair outside.  Left knee also interferes with her walking.  She's getting bad cramps in the left knee to where her kids have to help her up.  She has previously gotten injections in her right knee.  Right is fine and now left is a problem.  She did have one shot in her left, as well.  She cannot remember who this is.    She has to get her eye exam.  Does not know how to get there or what their phone number is, name or address.  CBG was 197 fasting this am.  She was about to take  her insulin.   History obtained from review of EMR, discussion with primary team, and interview with family, facility staff/caregiver and/or Ms. Curbow.  I reviewed available labs, medications, imaging, studies and related documents from the EMR.  Records reviewed and summarized above.   ROS Review of Systems  Constitutional:  Negative for appetite change and unexpected weight change.  HENT:  Negative for hearing loss and trouble swallowing.   Respiratory:  Negative for chest tightness and shortness of breath.   Gastrointestinal:  Negative for abdominal pain, constipation, diarrhea, nausea and vomiting.  Genitourinary:  Negative for dysuria.  Musculoskeletal:  Positive for arthralgias and gait problem.  Skin:  Negative for color change.  Neurological:  Negative for dizziness and weakness.  Psychiatric/Behavioral:  Positive for confusion. Negative for sleep disturbance.     Physical Exam: Vitals:   10/03/21 0920  BP: (!) 142/70  Pulse: 68  Resp: 16  Temp: 98.2 F (36.8 C)  SpO2: 97%  Weight: 252 lb (114.3 kg)   Body mass index is 41.93 kg/m. Wt Readings from Last 500 Encounters:  10/03/21 252 lb (114.3 kg)  09/25/21 251 lb (113.9 kg)  09/16/21 246 lb 14.6 oz (112 kg)  09/14/21 247 lb 4.8 oz (112.2 kg)  06/22/21 230 lb (104.3 kg)  06/19/21 218 lb 4.1 oz (99 kg)  05/12/21 219 lb (99.3 kg)  05/03/21 230 lb (104.3 kg)  04/27/21 212 lb (96.2 kg)  03/31/21 230 lb 12.8 oz (104.7 kg)  03/08/21 224 lb 9.6 oz (101.9 kg)  01/25/21 214 lb 9.6 oz (97.3 kg)  01/23/21 214 lb 8.1 oz (97.3 kg)  12/31/20 207 lb 7.3 oz (94.1 kg)  08/23/19 215 lb (97.5 kg)  08/07/19 233 lb (105.7 kg)  01/30/18 237 lb (107.5 kg)  01/15/18 237 lb (107.5 kg)  12/27/17 234 lb 6.4 oz (106.3 kg)  12/19/17 236 lb 8 oz (107.3 kg)  10/23/17 215 lb (97.5 kg)  10/18/17 215 lb (97.5 kg)  05/21/17 237 lb (107.5 kg)  04/23/17 227 lb 4.7 oz (103.1 kg)  12/05/15 202 lb (91.6 kg)  10/26/15 202 lb 6 oz (91.8 kg)   10/04/15 202 lb 9.6 oz (91.9 kg)  04/10/14 206 lb 12.8 oz (93.8 kg)  02/21/14 201 lb (91.2 kg)  12/19/12 210 lb (95.3 kg)   Physical Exam Constitutional:      Appearance: Normal appearance. She is obese.  Cardiovascular:     Rate and Rhythm: Normal rate and regular rhythm.     Pulses: Normal pulses.     Heart sounds: Normal heart sounds.  Pulmonary:     Effort: Pulmonary effort is normal.     Breath sounds: Normal breath sounds.  Abdominal:     General: Bowel sounds are normal.  Musculoskeletal:        General: Normal range of motion.     Right lower leg: No edema.     Left lower leg: No edema.  Skin:    General: Skin is warm.     Comments: Left midaxillary line large hematoma present, compression wrap dressing around trunk   Neurological:     General: No focal deficit present.     Mental Status: She is alert and oriented to person, place, and time.     Comments: Poor short-term memory and some challenges thinking, has more insight than she once did after her anoxic brain injury  Psychiatric:        Mood and Affect: Mood normal.     CURRENT PROBLEM LIST:  Patient Active Problem List   Diagnosis Date Noted   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   Cough 04/26/2021   Acute blood loss anemia    Controlled type 2 diabetes mellitus with hyperglycemia ([place])    Essential hypertension    Anoxic brain injury ([place]) 01/02/2021   Fever 12/20/2020   Abdominal wall cellulitis 12/20/2020   Dysphagia    Goals of care, counseling/discussion    Acute systolic heart failure ([place])    Encephalopathy acute    Type 1 diabetes mellitus without complication ([place]) 17/61/6073   Hypertension 12/02/2020   GERD (gastroesophageal reflux disease) 12/02/2020   Anemia 12/02/2020   Cardiac arrest ([place]) 12/01/2020   Unilateral primary osteoarthritis, left knee 12/01/2018   Lateral meniscus, posterior horn derangement, left 05/14/2018   Status post arthroscopy of left  knee 05/14/2018   Chronic low back pain  with sciatica 12/19/2017   Anemia 04/23/2017   Acute bronchitis 04/23/2017   Chronic back pain 10/03/2015   Symptomatic anemia 04/10/2014   Bilateral edema of lower extremity    Low back pain with left-sided sciatica     PAST MEDICAL HISTORY:  Active Ambulatory Problems    Diagnosis Date Noted   Symptomatic anemia 04/10/2014   Bilateral edema of lower extremity    Low back pain with left-sided sciatica    Chronic back pain 10/03/2015   Anemia 04/23/2017   Acute bronchitis 04/23/2017   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/19/2017   Lateral meniscus, posterior horn derangement, left 05/14/2018   Status post arthroscopy of left knee 05/14/2018   Unilateral primary osteoarthritis, left knee 12/01/2018   Cardiac arrest ([place]) 12/01/2020   Type 1 diabetes mellitus without complication ([place]) 67/61/9509   Hypertension 12/02/2020   GERD (gastroesophageal reflux disease) 12/02/2020   Anemia 12/02/2020   Encephalopathy acute    Acute systolic heart failure ([place])    Dysphagia    Goals of care, counseling/discussion    Fever 12/20/2020   Abdominal wall cellulitis 12/20/2020   Anoxic brain injury ([place]) 01/02/2021   Acute blood loss anemia    Controlled type 2 diabetes mellitus with hyperglycemia ([place])    Essential hypertension    Cough 04/26/2021   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   Resolved Ambulatory Problems    Diagnosis Date Noted   Other tear of medial meniscus, current injury, left knee, subsequent encounter 01/30/2018   Past Medical History:  Diagnosis Date   Arthritis    Asthma    B12 deficiency anemia 04/23/2017   Chronic bronchitis (HCC)    Chronic lower back pain    Diabetes mellitus without complication ([place])    Diabetes type 2, controlled ([place])    Elevated ferritin level    Fatty liver    GERD with stricture    Headache    History of blood transfusion "plenty"   Inguinal hernia    Low  back pain    Migraine    OA (osteoarthritis) of knee--left    Vitamin B 12 deficiency     SOCIAL HX:  Social History   Tobacco Use   Smoking status: Former    Types: Cigarettes   Smokeless tobacco: Never  Substance Use Topics   Alcohol use: Not Currently    Comment: occ     ALLERGIES:  Allergies  Allergen Reactions   Latex Hives   Other Other (See Comments), Anaphylaxis, Swelling and Hives    Hair glue causes throat to close and hives "glue" Hair glue causes throat to close    Codeine Nausea And Vomiting    Was on an empty stomach.   Nitrofurantoin Rash      PERTINENT MEDICATIONS:  Outpatient Encounter Medications as of 10/03/2021  Medication Sig   ACCU-CHEK GUIDE test strip USE 1 STRIP THREE TIMES DAILY   albuterol (VENTOLIN HFA) 108 (90 Base) MCG/ACT inhaler Inhale 2 puffs into the lungs every 6 (six) hours as needed for wheezing or shortness of breath.   amiodarone (PACERONE) 200 MG tablet Take 1 tablet (200 mg total) by mouth daily.   BD PEN NEEDLE NANO 2ND GEN 32G X 4 MM MISC 3 (three) times daily. as directed   Blood Glucose Monitoring Suppl (ACCU-CHEK GUIDE ME) w/Device KIT SMARTSIG:Via Meter   carvedilol (COREG) 6.25 MG tablet Take 1 tablet (6.25 mg total) by mouth 2 (two) times daily with a meal.  COVID-19 mRNA bivalent vaccine, Pfizer, (PFIZER COVID-19 VAC BIVALENT) injection Inject into the muscle.   divalproex (DEPAKOTE) 250 MG DR tablet Take 2 tablets (500 mg total) by mouth 3 (three) times daily.   docusate sodium (COLACE) 100 MG capsule Take 2 capsules (200 mg total) by mouth daily.   fluticasone furoate-vilanterol (BREO ELLIPTA) 200-25 MCG/ACT AEPB Inhale 1 puff into the lungs daily. (Patient taking differently: Inhale 1 puff into the lungs daily as needed (Breathing).)   fluticasone furoate-vilanterol (BREO ELLIPTA) 200-25 MCG/ACT AEPB Inhale 1 puff into the lungs daily.   HUMALOG KWIKPEN 100 UNIT/ML KwikPen 14-15 Units 3 (three) times daily before  meals.   Insulin Syringe-Needle U-100 (INSULIN SYRINGE .5CC/31GX5/16") 31G X 5/16" 0.5 ML MISC 3 (three) times daily.   LANTUS SOLOSTAR 100 UNIT/ML Solostar Pen Inject 30 Units into the skin at bedtime.   LINZESS 290 MCG CAPS capsule Take 290 mcg by mouth daily as needed (Constipation).   losartan (COZAAR) 25 MG tablet Take 1 tablet (25 mg total) by mouth daily.   meloxicam (MOBIC) 7.5 MG tablet Take 7.5 mg by mouth daily.   pantoprazole (PROTONIX) 40 MG tablet Take 1 tablet (40 mg total) by mouth 2 (two) times daily. (Patient taking differently: Take 40 mg by mouth at bedtime.)   sertraline (ZOLOFT) 25 MG tablet Take 1 tablet (25 mg total) by mouth daily.   SYMBICORT 160-4.5 MCG/ACT inhaler Inhale 2 puffs into the lungs in the morning and at bedtime. (Patient taking differently: Inhale 2 puffs into the lungs daily as needed (Breathing).)   No facility-administered encounter medications on file as of 10/03/2021.    Thank you for the opportunity to participate in the care of Ms. Fergeson.  The palliative care team will continue to follow. Please call our office at [phone] if we can be of additional assistance.   Hollace Kinnier, DO  COVID-19 PATIENT SCREENING TOOL Asked and negative response unless otherwise noted:  Have you had symptoms of covid, tested positive or been in contact with someone with symptoms/positive test in the past 5-10 days? no

## 2021-10-10 ENCOUNTER — Ambulatory Visit: Payer: Medicaid Other

## 2021-10-10 DIAGNOSIS — I469 Cardiac arrest, cause unspecified: Secondary | ICD-10-CM

## 2021-10-19 ENCOUNTER — Emergency Department (HOSPITAL_BASED_OUTPATIENT_CLINIC_OR_DEPARTMENT_OTHER)
Admission: EM | Admit: 2021-10-19 | Discharge: 2021-10-19 | Discharge status: Against Medical Advice | Payer: Medicaid Other | Source: Home / Self Care

## 2021-10-19 ENCOUNTER — Other Ambulatory Visit: Payer: Self-pay

## 2021-10-23 ENCOUNTER — Ambulatory Visit (INDEPENDENT_AMBULATORY_CARE_PROVIDER_SITE_OTHER): Payer: Medicaid Other | Admitting: Pulmonary Disease

## 2021-10-23 DIAGNOSIS — J4520 Mild intermittent asthma, uncomplicated: Secondary | ICD-10-CM | POA: Diagnosis not present

## 2021-10-23 LAB — PULMONARY FUNCTION TEST
DL/VA % pred: 117 %
DL/VA: 5.06 ml/min/mmHg/L
DLCO cor % pred: 106 %
DLCO cor: 24.44 ml/min/mmHg
DLCO unc % pred: 105 %
DLCO unc: 24.14 ml/min/mmHg
FEF 25-75 Post: 2.03 L/sec
FEF 25-75 Pre: 1.42 L/sec
FEF2575-%Change-Post: 42 %
FEF2575-%Pred-Post: 66 %
FEF2575-%Pred-Pre: 46 %
FEV1-%Change-Post: 12 %
FEV1-%Pred-Post: 73 %
FEV1-%Pred-Pre: 65 %
FEV1-Post: 2.29 L
FEV1-Pre: 2.04 L
FEV1FVC-%Change-Post: 2 %
FEV1FVC-%Pred-Pre: 87 %
FEV6-%Change-Post: 9 %
FEV6-%Pred-Post: 83 %
FEV6-%Pred-Pre: 75 %
FEV6-Post: 3.15 L
FEV6-Pre: 2.87 L
FEV6FVC-%Change-Post: 0 %
FEV6FVC-%Pred-Post: 102 %
FEV6FVC-%Pred-Pre: 101 %
FVC-%Change-Post: 9 %
FVC-%Pred-Post: 81 %
FVC-%Pred-Pre: 74 %
FVC-Post: 3.15 L
FVC-Pre: 2.88 L
Post FEV1/FVC ratio: 73 %
Post FEV6/FVC ratio: 100 %
Pre FEV1/FVC ratio: 71 %
Pre FEV6/FVC Ratio: 100 %
RV % pred: 105 %
RV: 1.89 L
TLC % pred: 88 %
TLC: 4.73 L

## 2021-10-23 NOTE — Progress Notes (Signed)
PFT done today. 

## 2021-10-23 NOTE — Progress Notes (Addendum)
This encounter was created in error - please disregard.

## 2021-11-02 ENCOUNTER — Telehealth: Payer: Self-pay | Admitting: Cardiology

## 2021-11-02 NOTE — Telephone Encounter (Signed)
Spoke to patient and advised she needs to try a different bra until its completely resolved. Denies any additional swelling or ay further complaints.

## 2021-11-02 NOTE — Telephone Encounter (Signed)
Patient called stating warm sensation on her left side which makes wearing her bra uncomfortable.

## 2021-11-03 ENCOUNTER — Telehealth: Payer: Self-pay | Admitting: Pulmonary Disease

## 2021-11-03 MED ORDER — PREDNISONE 10 MG PO TABS
ORAL_TABLET | ORAL | 0 refills | Status: AC
Start: 2021-11-03 — End: 2021-11-19

## 2021-11-03 NOTE — Telephone Encounter (Signed)
Called patient and she is stating that ever since yesterday she has had really bad SOB. She states that her inhalers are not helping her at all. Patient states she feels like she can not catch her breath. No apps with anyone till 8/12  Please advise Icard

## 2021-11-03 NOTE — Telephone Encounter (Signed)
Called and spoke to patient and went over medication with her. Told her how to take medication and how to taper it down. When to take it and with food. Patient states she will call back to schedule her follow up. She states she was out at the store. Nothing furhter needed

## 2021-11-08 ENCOUNTER — Encounter: Payer: Medicaid Other | Attending: Physical Medicine & Rehabilitation | Admitting: Physical Medicine & Rehabilitation

## 2021-11-08 ENCOUNTER — Encounter: Payer: Self-pay | Admitting: Physical Medicine & Rehabilitation

## 2021-11-08 VITALS — BP 142/89 | HR 72 | Ht 65.0 in | Wt 249.2 lb

## 2021-11-08 DIAGNOSIS — F329 Major depressive disorder, single episode, unspecified: Secondary | ICD-10-CM | POA: Insufficient documentation

## 2021-11-08 DIAGNOSIS — F321 Major depressive disorder, single episode, moderate: Secondary | ICD-10-CM | POA: Diagnosis not present

## 2021-11-08 DIAGNOSIS — G931 Anoxic brain damage, not elsewhere classified: Secondary | ICD-10-CM | POA: Diagnosis not present

## 2021-11-08 MED ORDER — SERTRALINE HCL 25 MG PO TABS: 25.0000 mg | ORAL_TABLET | Freq: Every day | ORAL | 1 refills | Status: DC

## 2021-11-08 MED ORDER — QUETIAPINE FUMARATE 25 MG PO TABS: 25.0000 mg | ORAL_TABLET | Freq: Every day | ORAL | 3 refills | Status: DC

## 2021-11-08 NOTE — Patient Instructions (Addendum)
PLEASE FEEL FREE TO CALL OUR OFFICE WITH ANY PROBLEMS OR QUESTIONS [phone])    [facility]   Social services organization in [place], [place] Address: [address], [place], [place] [address]   Phone: [phone]  --------------------------------------------------------------  SEROQUEL FOR YOUR SLEEP AND TO HELP STABILIZE YOUR MOOD  RESTART ZOLOFT FOR DEPRESSION   REMEMBER TO USE A CALENDAR, ORGANIZER TO KEEP YOUR INFORMATION STRAIGHT

## 2021-11-08 NOTE — Progress Notes (Signed)
Subjective:    Patient ID: Courtney Davies, female    DOB: 06-Oct-1975, 46 y.o.   MRN: [id number]  HPI   She is living by in [place] with her grown boys. She is working on exercises for her brain and memory. She feels that she has made some progress. However her memory remains an obstacle. She sometimes struggles with names.   Her mood has been a struggle for her. She says that some days are beter than others. She cries a lot and has mood swings.  She's no longer taking Zoloft. We stopped depakote because she wasn't taking it consistently. Each day's different. She is not seeing anyone. She does rely on her church for emotional support.  She reports an attempt to slit her wrists on one occasion. She lives at home with her two sons who are supportive.   She wants to get back to work and has been looking at jobs. She tells me she was given clearance by another doctor to return to driving.    Pain Inventory Average Pain  unknown Pain Right Now  unknown My pain is sharp, burning, dull, stabbing, tingling, and aching  LOCATION OF PAIN  back, knee  BOWEL Number of stools per week: 4-5  BLADDER Normal    Mobility walk without assistance ability to climb steps?  no do you drive?  yes Do you have any goals in this area?  no  Function employed # of hrs/week .  Neuro/Psych bladder control problems suicidal thoughts  Prior Studies Any changes since last visit?  no  Physicians involved in your care Any changes since last visit?  no   Family History  Problem Relation Age of Onset   Stroke Mother    Diabetes Mother    Diabetes Father    Stroke Maternal Grandmother    Diabetes Maternal Grandmother    Anemia Paternal Grandmother    Valvular heart disease Paternal Grandmother    Hypertension Mother    Prostate cancer Maternal Uncle        ? intestinal also   Social History   Socioeconomic History   Marital status: Single    Spouse name: Not on file   Number of children: 2    Years of education: 11th   Highest education level: Not on file  Occupational History   Occupation: salad maker in restaurant  Tobacco Use   Smoking status: Former    Types: Cigarettes   Smokeless tobacco: Never  Vaping Use   Vaping Use: Never used  Substance and Sexual Activity   Alcohol use: Not Currently    Comment: occ   Drug use: Yes    Types: Marijuana   Sexual activity: Not Currently    Birth control/protection: Surgical  Other Topics Concern   Not on file  Social History Narrative   ** Merged History Encounter **       Lives at home with her two children. Occasional caffeine use. Right-handed.   Social Determinants of Health   Financial Resource Strain: Not on file  Food Insecurity: Not on file  Transportation Needs: Not on file  Physical Activity: Not on file  Stress: Not on file  Social Connections: Not on file   Past Surgical History:  Procedure Laterality Date   [place]; 2002   ESOPHAGEAL DILATION  02/2020   by Dr Shana Chute   INGUINAL HERNIA REPAIR Bilateral 1980s    "total  of 4 surgeries" (04/23/2017)   IR GASTROSTOMY TUBE MOD SED  12/13/2020   KNEE ARTHROSCOPY WITH MEDIAL MENISECTOMY Left 01/30/2018   Procedure: LEFT KNEE ARTHROSCOPY WITH PARTIAL LATERAL MENISCECTOMY;  Surgeon: Mcarthur Rossetti, MD;  Location: [facility];  Service: Orthopedics;  Laterality: Left;   KNEE CARTILAGE SURGERY Left    SUBQ ICD IMPLANT N/A 09/13/2021   Procedure: SUBQ ICD IMPLANT;  Surgeon: Constance Haw, MD;  Location: [facility];  Service: Cardiovascular;  Laterality: N/A;   TUBAL LIGATION  2002   Past Medical History:  Diagnosis Date   Anemia 09/2015   Anemia    Arthritis    "knees" (04/23/2017)   Asthma    "teens; went away; came back" (04/23/2017)   B12 deficiency anemia 04/23/2017   Chronic bronchitis (HCC)    Chronic low back pain with sciatica    Chronic lower back pain    Diabetes  mellitus without complication ([place])    Diabetes type 2, controlled ([place])    Elevated ferritin level    Fatty liver    GERD (gastroesophageal reflux disease)    GERD with stricture    Headache    "1-2/wk" (04/23/2017)   History of blood transfusion "plenty"   "related to anemia" (04/23/2017)   Hypertension    Inguinal hernia    Low back pain    Migraine    "1-2/month" (04/23/2017)   OA (osteoarthritis) of knee--left    Symptomatic anemia 04/23/2017   Vitamin B 12 deficiency    BP (!) 142/89   Pulse 72   Ht 5' 5"  (1.651 m)   Wt 249 lb 3.2 oz (113 kg)   LMP 12/03/2012   SpO2 99%   BMI 41.47 kg/m   Opioid Risk Score:   Fall Risk Score:  `1  Depression screen [facility] 2/9     11/08/2021    2:32 PM 05/03/2021   11:29 AM 03/08/2021   11:28 AM  Depression screen PHQ 2/9  Decreased Interest 2 1 0  Down, Depressed, Hopeless 2 1 1   PHQ - 2 Score 4 2 1   Altered sleeping   1  Tired, decreased energy   0  Change in appetite   0  Feeling bad or failure about yourself    2  Trouble concentrating   0  Moving slowly or fidgety/restless   0  Suicidal thoughts 3  0  PHQ-9 Score   4  Difficult doing work/chores   Not difficult at all     Review of Systems  Constitutional:  Positive for unexpected weight change.  HENT: Negative.    Eyes: Negative.   Respiratory:  Positive for apnea and cough.   Cardiovascular: Negative.   Gastrointestinal:  Positive for constipation.  Endocrine: Negative.   Genitourinary:  Positive for frequency.  Musculoskeletal:  Positive for back pain.  Skin:  Positive for rash.  Allergic/Immunologic: Negative.   Neurological: Negative.   Hematological: Negative.   Psychiatric/Behavioral:  Positive for dysphoric mood and suicidal ideas.       Objective:   Physical Exam General: No acute distress HEENT: NCAT, EOMI, oral membranes moist Cards: reg rate  Chest: normal effort Abdomen: Soft, NT, ND Skin: dry, intact Extremities: no edema Psych: pleasant,  labile, in good spirits Skin: intact Neuro: Patient is alert and oriented with extra time.  Cranial nerve exam is unremarkable.   .  distracted,short term memory and attention deficits. Improved insight. Still lacks awareness. Motor 5/5. Sensory normal.  Musculoskeletal: MILD  antalgia RLE   Assessment & Plan:  1.  Decline in ADL, mobility , and cognition secondary to anoxic brain injury - has made gains but still demonstrates poor attention and awareness.  -very apprehensive about her driving 2. Left knee pain: ordered xrays left knee--never done             voltaren gel prn.  4. Mood:               -resume serqouel for mood lability -resume zoloft in am for depression -made referral to behavioral health psychology in [place], pt appreciative.  -encouraged engagement of church and friends as well.   6. T2DM per primary?: 10. HTN: 11. SVT/NSVT: On coreg and amiodarone bid for rate control.               -s/p ICD/pacer                   15  minutes of face to face patient care time were spent during this visit. All questions were encouraged and answered.  Follow up with me in 4 mos .

## 2021-11-23 ENCOUNTER — Encounter: Payer: Self-pay | Admitting: Internal Medicine

## 2021-11-23 ENCOUNTER — Other Ambulatory Visit: Payer: Medicaid Other | Admitting: Internal Medicine

## 2021-11-23 VITALS — BP 142/80 | Temp 97.7°F

## 2021-11-23 DIAGNOSIS — Z95810 Presence of automatic (implantable) cardiac defibrillator: Secondary | ICD-10-CM

## 2021-11-23 DIAGNOSIS — F321 Major depressive disorder, single episode, moderate: Secondary | ICD-10-CM

## 2021-11-23 DIAGNOSIS — Z515 Encounter for palliative care: Secondary | ICD-10-CM

## 2021-11-23 DIAGNOSIS — T148XXA Other injury of unspecified body region, initial encounter: Secondary | ICD-10-CM

## 2021-11-23 DIAGNOSIS — G931 Anoxic brain damage, not elsewhere classified: Secondary | ICD-10-CM

## 2021-11-23 NOTE — Progress Notes (Signed)
[facility] Follow-Up Visit Telephone: [phone]  Fax: [phone]   Date of encounter: 11/23/21 8:45 AM PATIENT NAME: Courtney Davies [address] [place] [address]   [phone] (home)  DOB: 04-08-1976 MRN: [id number] PRIMARY CARE PROVIDER:    [facility],  [address] [place] [address] [phone]  REFERRING PROVIDER:   [facility], [facility] [address] [place],   [address] [phone]  RESPONSIBLE PARTY:    Contact Information     Name Relation Home Work Mobile   Courtney Davies,Courtney Davies Mother [phone]  [phone]   Courtney Davies,Courtney Davies Father   [phone]        I met face to face with patient and family in her home. Palliative Care was asked to follow this patient by consultation request of  [facility] to address advance care planning and complex medical decision making. This is follow-up visit.                                     ASSESSMENT AND PLAN / RECOMMENDATIONS:   Advance Care Planning/Goals of Care: Goals include to maximize quality of life and symptom management. Patient/health care surrogate gave his/her permission to discuss.Our advance care planning conversation included a discussion about:    The value and importance of advance care planning  Experiences with loved ones who have been seriously ill or have died  Exploration of personal, cultural or spiritual beliefs that might influence medical decisions  Exploration of goals of care in the event of a sudden injury or illness  Identification  of a healthcare agent--sister, Courtney Davies [phone] (Mobile) Review and updating or creation of an  advance directive document . Decision not to resuscitate or to de-escalate disease focused treatments due to poor prognosis. CODE STATUS:  FULL CODE  Symptom Management/Plan: 1. Anoxic brain injury (Courtney Davies) -pt continues to struggle to function independently since her  cardiac arrest and this event -severe confusion about appts and medications -her mother is trying to keep track of her meds, but pt [facility] and cannot remember passwords to different systems to compare meds -mother, Courtney Davies, is not sure which meds pt currently needs and pt has only the pillbox with pills in it -unclear which list is correct--obtained list from PCP also -need to be able to speak with PCP and update med list at same time then reorder what's needed to address this in its entirety -pt was reminded by [facility] physician that it's not safe for her to drive due to her memory loss and her prior arrest -she has had mild improvement since the event, but confusion and short-term memory loss remain very big problems  2. Current moderate episode of major depressive disorder without prior episode (Courtney Davies) -had been off sertraline and put back on, appears she was also put back on seroquel for sleep which had been stopped, but she is not able to purchase her meds at present b/c she had covid times disability and now just got approved for SSI and still needs medicaid officially--will ask social worker to help with this and RN to help with med situation  3. ICD (implantable cardioverter-defibrillator) in place -took a long time, but finally placed, still has issue with #4 on left side resulting and it causes her pain when wearing a bra--to avoid using one and use sports  bras if out but can't afford them now  4. Hematoma -remains, pt had said US done of the area, but turns out [place] was her pelvis by PCP  5. Palliative care by specialist -really this patient needs more consistent social support with meds/appts more than anything -sister tries to track appts and mom meds, but still not enough or consistent at this point    Follow up Palliative Care Visit: Palliative care will continue to follow for complex medical decision making, advance care planning, and  clarification of goals. Return 01/23/2022  weeks or prn.    This visit was coded based on medical decision making (MDM).  PPS: 50%  HOSPICE ELIGIBILITY/DIAGNOSIS: TBD  Chief Complaint: Follow-up palliative visit  HISTORY OF PRESENT ILLNESS:  Courtney Davies is a 46 y.o. year old female  with anoxic brain injury with prior cardiac arrest, now has ICD, developed hematoma in surgery, has chronic back pain managed at [facility], DMII, obesity.   Reports depression comes and goes.  Praying with her sister helps.  She is not sleeping--up at 4am, then exhausted by lunchtime.    SSI has been approved.  Needs to reapply to medicaid now that covid coverage ended and son became adult.  Can't afford to pick up current medications.    Has been having back pain in her lower back.  PCP did [place] of her lower back which showed defibrillator in place and no concerns and no explanation for her pain.    She got a yeast infection b/c she was running high blood sugars--she had been drinking strawberry lemonade and eating bad things.  Better now--drinking water, only every now and then with the lemonade.  CBG 112 last night.  Must check sugar regularly--is to do 3 times per day.  She did get her yeast infection medicine.  Memory doctor told her not to drive at all.  She is able to remember things better since being home.  She does forget where she puts things.  She is going to get cameras so she knows who is coming when.    She does not have the zoloft, seroquel Low on meloxicam.    History obtained from review of EMR, discussion with primary team, and interview with family, facility staff/caregiver and/or Courtney Davies.  I reviewed available labs, medications, imaging, studies and related documents from the EMR.  Records reviewed and summarized above.   ROS Review of Systems  Constitutional:  Negative for activity change, appetite change, chills, fatigue, fever and unexpected weight change.  HENT:  Negative for  trouble swallowing.   Respiratory:  Negative for chest tightness and shortness of breath.   Cardiovascular:  Negative for chest pain, palpitations and leg swelling.  Gastrointestinal:  Negative for constipation.  Genitourinary:  Positive for dysuria.       Improving after yeast infection tx started (did pick it up)  Musculoskeletal:  Positive for arthralgias, back pain, gait problem and joint swelling.  Skin:  Negative for color change.  Neurological:  Negative for weakness.  Psychiatric/Behavioral:  Positive for confusion.        Depression    Physical Exam: There were no vitals filed for this visit. There is no height or weight on file to calculate BMI. Wt Readings from Last 500 Encounters:  11/08/21 249 lb 3.2 oz (113 kg)  10/03/21 252 lb (114.3 kg)  09/25/21 251 lb (113.9 kg)  09/16/21 246 lb 14.6 oz (112 kg)  09/14/21 247 lb 4.8 oz (112.2 kg)  06/22/21 230 lb (104.3 kg)  06/19/21 218 lb 4.1 oz (99 kg)  05/12/21 219 lb (99.3 kg)  05/03/21 230 lb (104.3 kg)  04/27/21 212 lb (96.2 kg)  03/31/21 230 lb 12.8 oz (104.7 kg)  03/08/21 224 lb 9.6 oz (101.9 kg)  01/25/21 214 lb 9.6 oz (97.3 kg)  01/23/21 214 lb 8.1 oz (97.3 kg)  12/31/20 207 lb 7.3 oz (94.1 kg)  08/23/19 215 lb (97.5 kg)  08/07/19 233 lb (105.7 kg)  01/30/18 237 lb (107.5 kg)  01/15/18 237 lb (107.5 kg)  12/27/17 234 lb 6.4 oz (106.3 kg)  12/19/17 236 lb 8 oz (107.3 kg)  10/23/17 215 lb (97.5 kg)  10/18/17 215 lb (97.5 kg)  05/21/17 237 lb (107.5 kg)  04/23/17 227 lb 4.7 oz (103.1 kg)  12/05/15 202 lb (91.6 kg)  10/26/15 202 lb 6 oz (91.8 kg)  10/04/15 202 lb 9.6 oz (91.9 kg)  04/10/14 206 lb 12.8 oz (93.8 kg)  02/21/14 201 lb (91.2 kg)  12/19/12 210 lb (95.3 kg)   Physical Exam Constitutional:      Appearance: Normal appearance. She is obese.  Cardiovascular:     Rate and Rhythm: Normal rate and regular rhythm.     Pulses: Normal pulses.     Heart sounds: Normal heart sounds.  Pulmonary:      Effort: Pulmonary effort is normal.     Breath sounds: Normal breath sounds.  Abdominal:     General: Bowel sounds are normal.     Palpations: Abdomen is soft.  Musculoskeletal:     Comments: Swelling, tenderness of left knee; warm, swollen and tender area on left rib/thoracic area (known hematoma from ICD placement)  Skin:    General: Skin is warm and dry.  Neurological:     General: No focal deficit present.     Mental Status: She is alert and oriented to person, place, and time.     Comments: But short-term memory loss severe  Psychiatric:        Mood and Affect: Mood normal.     CURRENT PROBLEM LIST:  Patient Active Problem List   Diagnosis Date Noted   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   Cough 04/26/2021   Acute blood loss anemia    Controlled type 2 diabetes mellitus with hyperglycemia ([place])    Essential hypertension    Anoxic brain injury ([place]) 01/02/2021   Fever 12/20/2020   Abdominal wall cellulitis 12/20/2020   Dysphagia    Goals of care, counseling/discussion    Acute systolic heart failure ([place])    Encephalopathy acute    Type 1 diabetes mellitus without complication ([place]) 29/92/4268   Hypertension 12/02/2020   GERD (gastroesophageal reflux disease) 12/02/2020   Anemia 12/02/2020   Cardiac arrest ([place]) 12/01/2020   Unilateral primary osteoarthritis, left knee 12/01/2018   Lateral meniscus, posterior horn derangement, left 05/14/2018   Status post arthroscopy of left knee 05/14/2018   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/19/2017   Anemia 04/23/2017   Acute bronchitis 04/23/2017   Chronic back pain 10/03/2015   Symptomatic anemia 04/10/2014   Bilateral edema of lower extremity    Low back pain with left-sided sciatica     PAST MEDICAL HISTORY:  Active Ambulatory Problems    Diagnosis Date Noted   Symptomatic anemia 04/10/2014   Bilateral edema of lower extremity    Low back pain with  left-sided sciatica    Chronic back pain 10/03/2015  Anemia 04/23/2017   Acute bronchitis 04/23/2017   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/19/2017   Lateral meniscus, posterior horn derangement, left 05/14/2018   Status post arthroscopy of left knee 05/14/2018   Unilateral primary osteoarthritis, left knee 12/01/2018   Cardiac arrest ([place]) 12/01/2020   Type 1 diabetes mellitus without complication ([place]) 59/56/3875   Hypertension 12/02/2020   GERD (gastroesophageal reflux disease) 12/02/2020   Anemia 12/02/2020   Encephalopathy acute    Acute systolic heart failure ([place])    Dysphagia    Goals of care, counseling/discussion    Fever 12/20/2020   Abdominal wall cellulitis 12/20/2020   Anoxic brain injury ([place]) 01/02/2021   Acute blood loss anemia    Controlled type 2 diabetes mellitus with hyperglycemia ([place])    Essential hypertension    Cough 04/26/2021   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   Resolved Ambulatory Problems    Diagnosis Date Noted   Other tear of medial meniscus, current injury, left knee, subsequent encounter 01/30/2018   Past Medical History:  Diagnosis Date   Arthritis    Asthma    B12 deficiency anemia 04/23/2017   Chronic bronchitis (HCC)    Chronic lower back pain    Diabetes mellitus without complication (HCC)    Diabetes type 2, controlled (HCC)    Elevated ferritin level    Fatty liver    GERD with stricture    Headache    History of blood transfusion "plenty"   Inguinal hernia    Low back pain    Migraine    OA (osteoarthritis) of knee--left    Vitamin B 12 deficiency     SOCIAL HX:  Social History   Tobacco Use   Smoking status: Former    Types: Cigarettes   Smokeless tobacco: Never  Substance Use Topics   Alcohol use: Not Currently    Comment: occ     ALLERGIES:  Allergies  Allergen Reactions   Latex Hives   Other Other (See Comments), Anaphylaxis, Swelling  and Hives    Hair glue causes throat to close and hives "glue" Hair glue causes throat to close    Codeine Nausea And Vomiting    Was on an empty stomach.   Nitrofurantoin Rash      PERTINENT MEDICATIONS:  Outpatient Encounter Medications as of 11/23/2021  Medication Sig   ACCU-CHEK GUIDE test strip USE 1 STRIP THREE TIMES DAILY   albuterol (VENTOLIN HFA) 108 (90 Base) MCG/ACT inhaler Inhale 2 puffs into the lungs every 6 (six) hours as needed for wheezing or shortness of breath.   amiodarone (PACERONE) 200 MG tablet Take 1 tablet (200 mg total) by mouth daily.   BD PEN NEEDLE NANO 2ND GEN 32G X 4 MM MISC 3 (three) times daily. as directed   Blood Glucose Monitoring Suppl (ACCU-CHEK GUIDE ME) w/Device KIT SMARTSIG:Via Meter   carvedilol (COREG) 6.25 MG tablet Take 1 tablet (6.25 mg total) by mouth 2 (two) times daily with a meal.   COVID-19 mRNA bivalent vaccine, Pfizer, (PFIZER COVID-19 VAC BIVALENT) injection Inject into the muscle.   divalproex (DEPAKOTE) 250 MG DR tablet Take 2 tablets (500 mg total) by mouth 3 (three) times daily.   docusate sodium (COLACE) 100 MG capsule Take 2 capsules (200 mg total) by mouth daily.   fluticasone furoate-vilanterol (BREO ELLIPTA) 200-25 MCG/ACT AEPB Inhale 1 puff into the lungs daily. (Patient taking differently: Inhale 1 puff into the lungs daily as needed (Breathing).)  fluticasone furoate-vilanterol (BREO ELLIPTA) 200-25 MCG/ACT AEPB Inhale 1 puff into the lungs daily.   HUMALOG KWIKPEN 100 UNIT/ML KwikPen 14-15 Units 3 (three) times daily before meals.   Insulin Syringe-Needle U-100 (INSULIN SYRINGE .5CC/31GX5/16") 31G X 5/16" 0.5 ML MISC 3 (three) times daily.   LANTUS SOLOSTAR 100 UNIT/ML Solostar Pen Inject 30 Units into the skin at bedtime.   LINZESS 290 MCG CAPS capsule Take 290 mcg by mouth daily as needed (Constipation).   losartan (COZAAR) 25 MG tablet Take 1 tablet (25 mg total) by mouth daily.   meloxicam (MOBIC) 7.5 MG tablet Take  7.5 mg by mouth daily.   pantoprazole (PROTONIX) 40 MG tablet Take 1 tablet (40 mg total) by mouth 2 (two) times daily. (Patient taking differently: Take 40 mg by mouth at bedtime.)   QUEtiapine (SEROQUEL) 25 MG tablet Take 1 tablet (25 mg total) by mouth at bedtime.   sertraline (ZOLOFT) 25 MG tablet Take 1 tablet (25 mg total) by mouth daily.   SYMBICORT 160-4.5 MCG/ACT inhaler Inhale 2 puffs into the lungs in the morning and at bedtime. (Patient taking differently: Inhale 2 puffs into the lungs daily as needed (Breathing).)   No facility-administered encounter medications on file as of 11/23/2021.    Thank you for the opportunity to participate in the care of Ms. Mally.  The palliative care team will continue to follow. Please call our office at [phone] if we can be of additional assistance.   Hollace Kinnier, DO  COVID-19 PATIENT SCREENING TOOL Asked and negative response unless otherwise noted:  Have you had symptoms of covid, tested positive or been in contact with someone with symptoms/positive test in the past 5-10 days? No

## 2021-12-05 NOTE — Progress Notes (Signed)
This encounter was created in error - please disregard.

## 2021-12-14 ENCOUNTER — Ambulatory Visit (INDEPENDENT_AMBULATORY_CARE_PROVIDER_SITE_OTHER): Payer: Medicaid Other

## 2021-12-14 DIAGNOSIS — I469 Cardiac arrest, cause unspecified: Secondary | ICD-10-CM

## 2021-12-14 LAB — CUP PACEART REMOTE DEVICE CHECK
Battery Remaining Percentage: 98 %
Date Time Interrogation Session: 20230831070300
Implantable Lead Implant Date: 20230531
Implantable Lead Location: 753862
Implantable Lead Model: 3501
Implantable Lead Serial Number: 235497
Implantable Pulse Generator Implant Date: 20230531
Pulse Gen Serial Number: 177788

## 2021-12-20 ENCOUNTER — Encounter: Payer: Self-pay | Admitting: Cardiology

## 2021-12-20 ENCOUNTER — Ambulatory Visit: Payer: Medicaid Other | Attending: Cardiology | Admitting: Cardiology

## 2021-12-20 VITALS — BP 126/82 | HR 84 | Ht 65.0 in

## 2021-12-20 DIAGNOSIS — I472 Ventricular tachycardia, unspecified: Secondary | ICD-10-CM

## 2021-12-20 DIAGNOSIS — I480 Paroxysmal atrial fibrillation: Secondary | ICD-10-CM | POA: Diagnosis not present

## 2021-12-20 NOTE — Progress Notes (Signed)
Electrophysiology Office Note   Date:  12/20/2021   ID:  PHALLON HAYDU, DOB [birth year], MRN [id number]  PCP:  [facility]  Cardiologist:  Irish Lack Primary Electrophysiologist:  Shanasia Ibrahim Meredith Leeds, MD    Chief Complaint: cardiac arrest   History of Present Illness: Courtney Davies is a 46 y.o. female who is being seen today for the evaluation of cardiac arrest at the request of [facility], [facility]. Presenting today for electrophysiology evaluation.  He has a history significant for type 2 diabetes and hypertension.  She had a cardiac arrest August 2022.  She had ventricular fibrillation during the arrest requiring multiple defibrillations.  She had a possible respiratory event prior to this.  After being discharged from the hospital she was sent to rehab.  She made a good recovery and returned home 01/24/2021.  At the time of her arrest she was at work.  She had a witnessed arrest.  Per EMS she was standing on the edge of a semitruck and collapsed and fell 4 feet.  When EMS arrived she was in ventricular fibrillation.  She received 5 rounds of epinephrine, 5 defibrillations and 400 mg of amiodarone.  On arrival to the emergency room she was intubated.  CT of the chest showed no PE.  She is now status post S ICD implanted 09/13/2021.  Today, denies symptoms of palpitations, chest pain, shortness of breath, orthopnea, PND, lower extremity edema, claudication, dizziness, presyncope, syncope, bleeding, or neurologic sequela. The patient is tolerating medications without difficulties.  Since being seen she has done well.  She continues to have some swelling at her device site.  There is no warmth.  Otherwise, she has no major complaints at this time.     Past Medical History:  Diagnosis Date   Anemia 09/2015   Anemia    Arthritis    "knees" (04/23/2017)   Asthma    "teens; went away; came back" (04/23/2017)   B12 deficiency anemia 04/23/2017   Chronic bronchitis (HCC)     Chronic low back pain with sciatica    Chronic lower back pain    Diabetes mellitus without complication ([place])    Diabetes type 2, controlled ([place])    Elevated ferritin level    Fatty liver    GERD (gastroesophageal reflux disease)    GERD with stricture    Headache    "1-2/wk" (04/23/2017)   History of blood transfusion "plenty"   "related to anemia" (04/23/2017)   Hypertension    Inguinal hernia    Low back pain    Migraine    "1-2/month" (04/23/2017)   OA (osteoarthritis) of knee--left    Symptomatic anemia 04/23/2017   Vitamin B 12 deficiency    Past Surgical History:  Procedure Laterality Date   ABDOMINAL HYSTERECTOMY  YRS AGO   COMPLETE   CESAREAN SECTION  1998; 2002   ESOPHAGEAL DILATION  02/2020   by Dr Shana Chute   INGUINAL HERNIA REPAIR Bilateral 1980s    "total of 4 surgeries" (04/23/2017)   IR GASTROSTOMY TUBE MOD SED  12/13/2020   KNEE ARTHROSCOPY WITH MEDIAL MENISECTOMY Left 01/30/2018   Procedure: LEFT KNEE ARTHROSCOPY WITH PARTIAL LATERAL MENISCECTOMY;  Surgeon: Mcarthur Rossetti, MD;  Location: [facility];  Service: Orthopedics;  Laterality: Left;   KNEE CARTILAGE SURGERY Left    SUBQ ICD IMPLANT N/A 09/13/2021   Procedure: SUBQ ICD IMPLANT;  Surgeon: Constance Haw, MD;  Location: [facility];  Service: Cardiovascular;  Laterality: N/A;  TUBAL LIGATION  2002     Current Outpatient Medications  Medication Sig Dispense Refill   albuterol (VENTOLIN HFA) 108 (90 Base) MCG/ACT inhaler Inhale 2 puffs into the lungs every 6 (six) hours as needed for wheezing or shortness of breath. 18 g 2   BD PEN NEEDLE NANO 2ND GEN 32G X 4 MM MISC 3 (three) times daily. as directed     Blood Glucose Monitoring Suppl (ACCU-CHEK GUIDE ME) w/Device KIT SMARTSIG:Via Meter     carvedilol (COREG) 6.25 MG tablet Take 1 tablet (6.25 mg total) by mouth 2 (two) times daily with a meal. 60 tablet 0   divalproex (DEPAKOTE) 250 MG DR tablet Take 2 tablets (500 mg total) by mouth 3  (three) times daily. 180 tablet 0   docusate sodium (COLACE) 100 MG capsule Take 2 capsules (200 mg total) by mouth daily. 60 capsule 0   fluticasone furoate-vilanterol (BREO ELLIPTA) 200-25 MCG/ACT AEPB Inhale 1 puff into the lungs daily. (Patient taking differently: Inhale 1 puff into the lungs daily as needed (Breathing).) 60 each 0   fluticasone furoate-vilanterol (BREO ELLIPTA) 200-25 MCG/ACT AEPB Inhale 1 puff into the lungs daily. 90 each 3   Insulin Syringe-Needle U-100 (INSULIN SYRINGE .5CC/31GX5/16") 31G X 5/16" 0.5 ML MISC 3 (three) times daily.     LANTUS SOLOSTAR 100 UNIT/ML Solostar Pen Inject 30 Units into the skin at bedtime.     LINZESS 290 MCG CAPS capsule Take 290 mcg by mouth daily as needed (Constipation).     losartan (COZAAR) 25 MG tablet Take 1 tablet (25 mg total) by mouth daily. 30 tablet 0   meloxicam (MOBIC) 7.5 MG tablet Take 7.5 mg by mouth daily.     pantoprazole (PROTONIX) 40 MG tablet Take 1 tablet (40 mg total) by mouth 2 (two) times daily. (Patient taking differently: Take 40 mg by mouth at bedtime.) 60 tablet 0   QUEtiapine (SEROQUEL) 25 MG tablet Take 1 tablet (25 mg total) by mouth at bedtime. 30 tablet 3   sertraline (ZOLOFT) 25 MG tablet Take 1 tablet (25 mg total) by mouth daily. 30 tablet 1   SYMBICORT 160-4.5 MCG/ACT inhaler Inhale 2 puffs into the lungs in the morning and at bedtime. (Patient taking differently: Inhale 2 puffs into the lungs daily as needed (Breathing).) 1 each 11   ACCU-CHEK GUIDE test strip USE 1 STRIP THREE TIMES DAILY (Patient not taking: Reported on 12/20/2021)     HUMALOG KWIKPEN 100 UNIT/ML KwikPen 14-15 Units 3 (three) times daily before meals. (Patient not taking: Reported on 12/20/2021)     No current facility-administered medications for this visit.    Allergies:   Latex, Other, Codeine, and Nitrofurantoin   Social History:  The patient  reports that she has quit smoking. Her smoking use included cigarettes. She has never used  smokeless tobacco. She reports that she does not currently use alcohol. She reports current drug use. Drug: Marijuana.   Family History:  The patient's family history includes Anemia in her paternal grandmother; Diabetes in her father, maternal grandmother, and mother; Hypertension in her mother; Prostate cancer in her maternal uncle; Stroke in her maternal grandmother and mother; Valvular heart disease in her paternal grandmother.   ROS:  Please see the history of present illness.   Otherwise, review of systems is positive for none.   All other systems are reviewed and negative.   PHYSICAL EXAM: VS:  BP 126/82   Pulse 84   Ht 5' 5"  (1.651 m)  LMP 12/03/2012   BMI 41.47 kg/m  , BMI Body mass index is 41.47 kg/m. GEN: Well nourished, well developed, in no acute distress  HEENT: normal  Neck: no JVD, carotid bruits, or masses Cardiac: RRR; no murmurs, rubs, or gallops,no edema  Respiratory:  clear to auscultation bilaterally, normal work of breathing GI: soft, nontender, nondistended, + BS MS: no deformity or atrophy  Skin: warm and dry, device site well healed, mild swelling Neuro:  Strength and sensation are intact Psych: euthymic mood, full affect  EKG:  EKG is ordered today. Personal review of the ekg ordered shows sinus rhythm, rate 84  Personal review of the device interrogation today. Results in [place]: 06/19/2021: ALT 18; BUN 12; Creatinine, Ser 0.79; Hemoglobin 14.1; Platelets 237; Potassium 3.9; Sodium 133    Lipid Panel     Component Value Date/Time   CHOL 163 12/02/2020 0834   TRIG 151 (H) 12/07/2020 0510   HDL 33 (L) 12/02/2020 0834   CHOLHDL 4.9 12/02/2020 0834   VLDL 40 12/02/2020 0834   LDLCALC 90 12/02/2020 0834     Wt Readings from Last 3 Encounters:  11/08/21 249 lb 3.2 oz (113 kg)  10/03/21 252 lb (114.3 kg)  09/25/21 251 lb (113.9 kg)      Other studies Reviewed: Additional studies/ records that were reviewed today include: CMRI  03/14/21  Review of the above records today demonstrates:  1. Normal LV size, mild hypertrophy, and low normal systolic function (EF 69%). Septal bounce   2.  Normal RV size and systolic function (EF 45%)   3.  No late gadolinium enhancement   4.  Mild mitral regurgitation (regurgitant fraction 11%)  Coronary CTA 02/02/2021 1. No evidence of CAD (0%). Consider non-atherosclerotic causes of chest pain.  ASSESSMENT AND PLAN:  1.  Ventricular fibrillation/cardiac arrest: Required several defibrillations during her code.  Coronary CT without abnormality.  Ejection fraction 53% on MRI.  MRI without evidence of infiltrative disease.  Status post S ICD implanted 09/13/2021.  Device functioning appropriately.  She has some swelling with likely residual hematoma.  We Kandra Graven continue with current management.  We Sandro Burgo check labs for amiodarone monitoring.  2.  Paroxysmal atrial fibrillation: Currently on amiodarone 200 mg daily.  CHA2DS2-VASc of 0.  She has a less than 1% burden of atrial fibrillation noted on her device.  If at her next visit, she has had no further episodes, may be able to stop her amiodarone.   Current medicines are reviewed at length with the patient today.   The patient does not have concerns regarding her medicines.  The following changes were made today: None  Labs/ tests ordered today include:  No orders of the defined types were placed in this encounter.    Disposition:   FU 6 months  Signed, Zanetta Dehaan Meredith Leeds, MD  12/20/2021 3:24 PM     [address] [place] [address] [place] [address] [phone] (office) [phone] (fax)

## 2021-12-20 NOTE — Patient Instructions (Addendum)
Medication Instructions:  Your physician recommends that you continue on your current medications as directed. Please refer to the Current Medication list given to you today.  *If you need a refill on your cardiac medications before your next appointment, please call your pharmacy*   Lab Work: None ordered   Testing/Procedures: None ordered   Follow-Up: At [facility], you and your health needs are our priority.  As part of our continuing mission to provide you with exceptional heart care, we have created designated Provider Care Teams.  These Care Teams include your primary Cardiologist (physician) and Advanced Practice Providers (APPs -  Physician Assistants and Nurse Practitioners) who all work together to provide you with the care you need, when you need it.  Remote monitoring is used to monitor your Pacemaker or ICD from home. This monitoring reduces the number of office visits required to check your device to one time per year. It allows [place] to keep an eye on the functioning of your device to ensure it is working properly. You are scheduled for a device check from home on 03/15/2022. You may send your transmission at any time that day. If you have a wireless device, the transmission will be sent automatically. After your physician reviews your transmission, you will receive a postcard with your next transmission date.  Your next appointment:   6 month(s)  The format for your next appointment:   In Person  Provider:   You will see one of the following Advanced Practice Providers on your designated Care Team:   Tommye Standard, [place] Legrand Como "Jonni Sanger" Chalmers Cater, [place]    Thank you for choosing [facility]!!   Trinidad Curet, RN [phone]    Other Instructions  Important Information About Sugar

## 2021-12-25 NOTE — Addendum Note (Signed)
Addended by: Michelle Nasuti on: 12/25/2021 08:19 AM   Modules accepted: Orders

## 2021-12-28 ENCOUNTER — Telehealth: Payer: Self-pay

## 2021-12-28 NOTE — Telephone Encounter (Signed)
PA for Quetiapine sent to insurance through [employer]

## 2021-12-28 NOTE — Telephone Encounter (Signed)
Request Reference Number: [id number]. QUETIAPINE TAB 25MG is approved through 12/29/2022. For further questions, call [employer] at [phone].

## 2022-01-04 NOTE — Progress Notes (Signed)
Remote ICD transmission.   

## 2022-01-19 ENCOUNTER — Other Ambulatory Visit: Payer: Self-pay | Admitting: Physical Medicine & Rehabilitation

## 2022-01-19 DIAGNOSIS — F321 Major depressive disorder, single episode, moderate: Secondary | ICD-10-CM

## 2022-01-19 DIAGNOSIS — F329 Major depressive disorder, single episode, unspecified: Secondary | ICD-10-CM

## 2022-01-23 ENCOUNTER — Other Ambulatory Visit: Payer: Medicaid Other | Admitting: Internal Medicine

## 2022-01-23 ENCOUNTER — Encounter: Payer: Self-pay | Admitting: Internal Medicine

## 2022-01-23 VITALS — BP 138/90 | HR 54 | Temp 97.7°F | Resp 16

## 2022-01-23 DIAGNOSIS — F321 Major depressive disorder, single episode, moderate: Secondary | ICD-10-CM

## 2022-01-23 DIAGNOSIS — Z515 Encounter for palliative care: Secondary | ICD-10-CM

## 2022-01-23 DIAGNOSIS — G931 Anoxic brain damage, not elsewhere classified: Secondary | ICD-10-CM

## 2022-01-23 NOTE — Progress Notes (Signed)
[facility] Palliative Care Follow-Up Visit Telephone: [phone]  Fax: [phone]   Date of encounter: 01/23/22 12:16 PM PATIENT NAME: Courtney Davies [address] [place] [address]   [phone] (home)  DOB: 01-28-1976 MRN: [id number] PRIMARY CARE PROVIDER:    [facility], [facility],  [address] [place] [address] [phone]  REFERRING PROVIDER:   Center, [facility] [address] [place],  [place] [address] [phone]  RESPONSIBLE PARTY:    Contact Information     Name Relation Home Work Mobile   Abdelrahman,Barbara Mother [phone]  [phone]   Benak,[name] Father   [phone]        I met face to face with patient and family in her home. Palliative Care was asked to follow this patient by consultation request of  Center, [place] to address advance care planning and complex medical decision making. This is follow-up visit.                                     ASSESSMENT AND PLAN / RECOMMENDATIONS:   Advance Care Planning/Goals of Care: Goals include to maximize quality of life and symptom management. Patient/health care surrogate gave his/her permission to discuss.Our advance care planning conversation included a discussion about:    The value and importance of advance care planning  Experiences with loved ones who have been seriously ill or have died  Exploration of personal, cultural or spiritual beliefs that might influence medical decisions  Exploration of goals of care in the event of a sudden injury or illness  Identification  of a healthcare agent--mother, and sister is helping with appt arrangements Review and updating or creation of an  advance directive document . Decision not to resuscitate or to de-escalate disease focused treatments due to poor prognosis. CODE STATUS:FULL CODE  Symptom Management/Plan: 1. Anoxic brain injury ([place]) -pt continues to struggle considerably with  cognitive loss related to this from her cardiac arrest -she is back to living in her own home with her sons; however, her mother helps with her medications on a limited basis (she's of advanced age) and her sister helps make appts for her -we agreed that pt needs her providers to be in the same system to keep her med list and appts organized at this point -arranged appt with Windell Moulding, NP, in [facility] system to help with continuity -note that other providers are in [facility] or [facility] systems so info in epic -had been going to [facility] but pt not even sure of name and was getting pain mgt also through them though provider listed also is under [facility] (?) -would benefit from regular glucose checks and endo f/u  2. Current moderate episode of major depressive disorder without prior episode ([place]) -seems to be under good control now with current regimen  3. Palliative care by specialist -unfortunately, our program will no longer serve her population which is the other reason I wanted to be sure she was hooked into a connected PCP -would benefit from PCS at home to help with cooking, med reminders and she reports that the endocrinology office already started this application for her   I spent 50 minutes providing this consultation. More than 50% of the time in this consultation was spent in counseling and care coordination.   PPS: 60%  HOSPICE ELIGIBILITY/DIAGNOSIS: TBD  Chief Complaint: Follow-up palliative visit  HISTORY  OF PRESENT ILLNESS:  Courtney Davies is a 46 y.o. year old female  with anoxic brain injury s/p cardiac arrest, now has ICD, b12 deficiency, diabetes mellitus 2 that has been poorly controlled (in view of forgetting to check and not administering insulin along with poor food choices and inadequate exercise, chronic right knee pain with previous injections but ongoing challenges.   Getting b12 injections through hematology, Dr. Baird Cancer.  Now established with endocrine for her  diabetes due to lows she recently had and difficulty with control.  She is still struggling to remember to check her sugars and take her meds and insulin as directed.  She is seeing Isabell Jarvis at [facility] for this now.  Dr. Holley Raring manages her chronic pain with her knee.  Doesn't really have anyone following her as PCP.  History obtained from review of EMR, discussion with primary team, and interview with family, facility staff/caregiver and/or Courtney Davies.  I reviewed available labs, medications, imaging, studies and related documents from the EMR.  Records reviewed and summarized above.   ROS Review of Systems see hpi  Physical Exam: Vitals:   01/23/22 0933  BP: (!) 138/90  Pulse: (!) 54  Resp: 16  Temp: 97.7 F (36.5 C)  SpO2: 98%   There is no height or weight on file to calculate BMI. Wt Readings from Last 500 Encounters:  11/08/21 249 lb 3.2 oz (113 kg)  10/03/21 252 lb (114.3 kg)  09/25/21 251 lb (113.9 kg)  09/16/21 246 lb 14.6 oz (112 kg)  09/14/21 247 lb 4.8 oz (112.2 kg)  06/22/21 230 lb (104.3 kg)  06/19/21 218 lb 4.1 oz (99 kg)  05/12/21 219 lb (99.3 kg)  05/03/21 230 lb (104.3 kg)  04/27/21 212 lb (96.2 kg)  03/31/21 230 lb 12.8 oz (104.7 kg)  03/08/21 224 lb 9.6 oz (101.9 kg)  01/25/21 214 lb 9.6 oz (97.3 kg)  01/23/21 214 lb 8.1 oz (97.3 kg)  12/31/20 207 lb 7.3 oz (94.1 kg)  08/23/19 215 lb (97.5 kg)  08/07/19 233 lb (105.7 kg)  01/30/18 237 lb (107.5 kg)  01/15/18 237 lb (107.5 kg)  12/27/17 234 lb 6.4 oz (106.3 kg)  12/19/17 236 lb 8 oz (107.3 kg)  10/23/17 215 lb (97.5 kg)  10/18/17 215 lb (97.5 kg)  05/21/17 237 lb (107.5 kg)  04/23/17 227 lb 4.7 oz (103.1 kg)  12/05/15 202 lb (91.6 kg)  10/26/15 202 lb 6 oz (91.8 kg)  10/04/15 202 lb 9.6 oz (91.9 kg)  04/10/14 206 lb 12.8 oz (93.8 kg)  02/21/14 201 lb (91.2 kg)  12/19/12 210 lb (95.3 kg)   Physical Exam Constitutional:      Appearance: Normal appearance. She is obese.  HENT:      Head: Normocephalic and atraumatic.  Cardiovascular:     Rate and Rhythm: Normal rate and regular rhythm.  Pulmonary:     Effort: Pulmonary effort is normal.     Breath sounds: Normal breath sounds.  Abdominal:     General: Bowel sounds are normal.     Tenderness: There is no abdominal tenderness.  Musculoskeletal:        General: Normal range of motion.     Right lower leg: No edema.     Left lower leg: No edema.     Comments: Tenderness of knee medially  Skin:    General: Skin is warm and dry.     Comments: Hematoma remains midaxillary line ever since ICD placement  Neurological:     General: No focal deficit present.     Mental Status: She is alert and oriented to person, place, and time.     Comments: But short-term memory loss--names, appts, meds  Psychiatric:        Mood and Affect: Mood normal.     CURRENT PROBLEM LIST:  Patient Active Problem List   Diagnosis Date Noted   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   Cough 04/26/2021   Acute blood loss anemia    Controlled type 2 diabetes mellitus with hyperglycemia ([place])    Essential hypertension    Anoxic brain injury ([place]) 01/02/2021   Fever 12/20/2020   Abdominal wall cellulitis 12/20/2020   Dysphagia    Goals of care, counseling/discussion    Acute systolic heart failure ([place])    Encephalopathy acute    Type 1 diabetes mellitus without complication ([place]) 39/53/2023   Hypertension 12/02/2020   GERD (gastroesophageal reflux disease) 12/02/2020   Anemia 12/02/2020   Cardiac arrest ([place]) 12/01/2020   Unilateral primary osteoarthritis, left knee 12/01/2018   Lateral meniscus, posterior horn derangement, left 05/14/2018   Status post arthroscopy of left knee 05/14/2018   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/19/2017   Anemia 04/23/2017   Acute bronchitis 04/23/2017   Chronic back pain 10/03/2015   Symptomatic anemia 04/10/2014   Bilateral edema of  lower extremity    Low back pain with left-sided sciatica     PAST MEDICAL HISTORY:  Active Ambulatory Problems    Diagnosis Date Noted   Symptomatic anemia 04/10/2014   Bilateral edema of lower extremity    Low back pain with left-sided sciatica    Chronic back pain 10/03/2015   Anemia 04/23/2017   Acute bronchitis 04/23/2017   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/19/2017   Lateral meniscus, posterior horn derangement, left 05/14/2018   Status post arthroscopy of left knee 05/14/2018   Unilateral primary osteoarthritis, left knee 12/01/2018   Cardiac arrest ([place]) 12/01/2020   Type 1 diabetes mellitus without complication ([place]) 34/35/6861   Hypertension 12/02/2020   GERD (gastroesophageal reflux disease) 12/02/2020   Anemia 12/02/2020   Encephalopathy acute    Acute systolic heart failure (H[facility])    Dysphagia    Goals of care, counseling/discussion    Fever 12/20/2020   Abdominal wall cellulitis 12/20/2020   Anoxic brain injury ([place]) 01/02/2021   Acute blood loss anemia    Controlled type 2 diabetes mellitus with hyperglycemia ([place])    Essential hypertension    Cough 04/26/2021   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   Resolved Ambulatory Problems    Diagnosis Date Noted   Other tear of medial meniscus, current injury, left knee, subsequent encounter 01/30/2018   Past Medical History:  Diagnosis Date   Arthritis    Asthma    B12 deficiency anemia 04/23/2017   Chronic bronchitis (H[facility])    Chronic lower back pain    Diabetes mellitus without complication ([place])    Diabetes type 2, controlled ([place])    Elevated ferritin level    Fatty liver    GERD with stricture    Headache    History of blood transfusion "plenty"   Inguinal hernia    Low back pain    Migraine    OA (osteoarthritis) of knee--left    Vitamin B 12 deficiency     SOCIAL HX:  Social History   Tobacco Use  Smoking status: Former     Types: Cigarettes   Smokeless tobacco: Never  Substance Use Topics   Alcohol use: Not Currently    Comment: occ     ALLERGIES:  Allergies  Allergen Reactions   Latex Hives   Other Other (See Comments), Anaphylaxis, Swelling and Hives    Hair glue causes throat to close and hives "glue" Hair glue causes throat to close    Codeine Nausea And Vomiting    Was on an empty stomach.   Nitrofurantoin Rash      PERTINENT MEDICATIONS:  Outpatient Encounter Medications as of 01/23/2022  Medication Sig   ACCU-CHEK GUIDE test strip USE 1 STRIP THREE TIMES DAILY (Patient not taking: Reported on 12/20/2021)   albuterol (VENTOLIN HFA) 108 (90 Base) MCG/ACT inhaler Inhale 2 puffs into the lungs every 6 (six) hours as needed for wheezing or shortness of breath.   BD PEN NEEDLE NANO 2ND GEN 32G X 4 MM MISC 3 (three) times daily. as directed   Blood Glucose Monitoring Suppl (ACCU-CHEK GUIDE ME) w/Device KIT SMARTSIG:Via Meter   carvedilol (COREG) 6.25 MG tablet Take 1 tablet (6.25 mg total) by mouth 2 (two) times daily with a meal.   divalproex (DEPAKOTE) 250 MG DR tablet Take 2 tablets (500 mg total) by mouth 3 (three) times daily.   docusate sodium (COLACE) 100 MG capsule Take 2 capsules (200 mg total) by mouth daily.   fluticasone furoate-vilanterol (BREO ELLIPTA) 200-25 MCG/ACT AEPB Inhale 1 puff into the lungs daily. (Patient taking differently: Inhale 1 puff into the lungs daily as needed (Breathing).)   fluticasone furoate-vilanterol (BREO ELLIPTA) 200-25 MCG/ACT AEPB Inhale 1 puff into the lungs daily.   HUMALOG KWIKPEN 100 UNIT/ML KwikPen 14-15 Units 3 (three) times daily before meals. (Patient not taking: Reported on 12/20/2021)   Insulin Syringe-Needle U-100 (INSULIN SYRINGE .5CC/31GX5/16") 31G X 5/16" 0.5 ML MISC 3 (three) times daily.   LANTUS SOLOSTAR 100 UNIT/ML Solostar Pen Inject 30 Units into the skin at bedtime.   LINZESS 290 MCG CAPS capsule Take 290 mcg by mouth daily as needed  (Constipation).   losartan (COZAAR) 25 MG tablet Take 1 tablet (25 mg total) by mouth daily.   meloxicam (MOBIC) 7.5 MG tablet Take 7.5 mg by mouth daily.   pantoprazole (PROTONIX) 40 MG tablet Take 1 tablet (40 mg total) by mouth 2 (two) times daily. (Patient taking differently: Take 40 mg by mouth at bedtime.)   QUEtiapine (SEROQUEL) 25 MG tablet Take 1 tablet (25 mg total) by mouth at bedtime.   sertraline (ZOLOFT) 25 MG tablet TAKE 1 TABLET(25 MG) BY MOUTH DAILY   SYMBICORT 160-4.5 MCG/ACT inhaler Inhale 2 puffs into the lungs in the morning and at bedtime. (Patient taking differently: Inhale 2 puffs into the lungs daily as needed (Breathing).)   No facility-administered encounter medications on file as of 01/23/2022.    Thank you for the opportunity to participate in the care of Ms. Tartt.  The palliative care team will continue to follow. Please call our office at [phone] if we can be of additional assistance.   Hollace Kinnier, DO  COVID-19 PATIENT SCREENING TOOL Asked and negative response unless otherwise noted:  Have you had symptoms of covid, tested positive or been in contact with someone with symptoms/positive test in the past 5-10 days? no

## 2022-02-06 ENCOUNTER — Encounter: Payer: Self-pay | Admitting: Orthopedic Surgery

## 2022-02-08 ENCOUNTER — Ambulatory Visit (INDEPENDENT_AMBULATORY_CARE_PROVIDER_SITE_OTHER): Payer: Medicaid Other | Admitting: Orthopedic Surgery

## 2022-02-08 ENCOUNTER — Encounter: Payer: Self-pay | Admitting: Orthopedic Surgery

## 2022-02-08 ENCOUNTER — Telehealth: Payer: Self-pay | Admitting: *Deleted

## 2022-02-08 VITALS — BP 126/86 | HR 69 | Temp 97.2°F | Resp 18 | Ht 65.5 in | Wt 245.0 lb

## 2022-02-08 DIAGNOSIS — R2681 Unsteadiness on feet: Secondary | ICD-10-CM

## 2022-02-08 DIAGNOSIS — G931 Anoxic brain damage, not elsewhere classified: Secondary | ICD-10-CM | POA: Diagnosis not present

## 2022-02-08 DIAGNOSIS — Z95810 Presence of automatic (implantable) cardiac defibrillator: Secondary | ICD-10-CM

## 2022-02-08 DIAGNOSIS — M25562 Pain in left knee: Secondary | ICD-10-CM

## 2022-02-08 DIAGNOSIS — F329 Major depressive disorder, single episode, unspecified: Secondary | ICD-10-CM | POA: Diagnosis not present

## 2022-02-08 DIAGNOSIS — Z794 Long term (current) use of insulin: Secondary | ICD-10-CM

## 2022-02-08 DIAGNOSIS — I480 Paroxysmal atrial fibrillation: Secondary | ICD-10-CM

## 2022-02-08 DIAGNOSIS — I10 Essential (primary) hypertension: Secondary | ICD-10-CM | POA: Diagnosis not present

## 2022-02-08 DIAGNOSIS — G8929 Other chronic pain: Secondary | ICD-10-CM

## 2022-02-08 DIAGNOSIS — R296 Repeated falls: Secondary | ICD-10-CM

## 2022-02-08 DIAGNOSIS — E1165 Type 2 diabetes mellitus with hyperglycemia: Secondary | ICD-10-CM | POA: Diagnosis not present

## 2022-02-08 DIAGNOSIS — J4520 Mild intermittent asthma, uncomplicated: Secondary | ICD-10-CM

## 2022-02-08 DIAGNOSIS — E538 Deficiency of other specified B group vitamins: Secondary | ICD-10-CM

## 2022-02-08 DIAGNOSIS — K2101 Gastro-esophageal reflux disease with esophagitis, with bleeding: Secondary | ICD-10-CM

## 2022-02-08 DIAGNOSIS — N3946 Mixed incontinence: Secondary | ICD-10-CM

## 2022-02-08 DIAGNOSIS — G3184 Mild cognitive impairment, so stated: Secondary | ICD-10-CM

## 2022-02-08 NOTE — Progress Notes (Signed)
Careteam: [name], [name], [name], MD as PCP - Electrophysiology (Cardiology) [place], [place] [facility])  Seen by: Windell Moulding, AGNP-C  PLACE OF SERVICE:  [place] Directive information Does Patient Have a Medical Advance Directive?: No, Would patient like information on creating a medical advance directive?: No - Patient declined  Allergies  Allergen Reactions   Latex Hives   Other Other (See Comments), Anaphylaxis, Swelling and Hives    Hair glue causes throat to close and hives "glue" Hair glue causes throat to close    Codeine Nausea And Vomiting    Was on an empty stomach.   Nitrofurantoin Rash    Chief Complaint  Patient presents with   Establish Care    Patient is here to establish care. Has medication bottles at initial appointment     HPI: Patient is a 46 y.o. female seen today to establish at [facility].   Previous provider was [facility], Dr. Holley Raring. Medical records requested, medications confirmed with [facility]. Lives at home with sons, ages 45 and 49. 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 Recently had disability approved.  Anoxic brain injury- s/p cardiac arrest 12/01/2020, 12/04/2021 MRI brain noted areas of bilateral cerebral cortical reduced diffusion- along hippocampi, struggles with ADLs due to poor memory, mother helps with medication administration, sons help with dressing and showering, palliative services have ended  Cognitive impairment- see above, unable to hold a job, she has trouble performing/completing daily tasks, reports being very forgetful or cannot concentrate, she has tried leaving notes around house- but reports it does not work, requesting home health aide to help with ADLs and memory  ICD-  followed by Dr. Curt Bears, s/p ICD implant 09/13/2021, continues to have large hematoma to left midaxillary line  Weakness/ unstable gait- followed by Dr. Tessa Lerner, continues to have balance issues and poor safety awareness, frequent falling- no recent injury  Frequent falls- goes to PT 2x/week does not ambulate with walker  Left knee pain- ongoing, evaluated by orthopedics 04/2020, given steroid injections in past, remains on mobic daily  T2DM- does not check blood sugars daily, reports recent low blood sugar- cannot remember when, previous provider placed her on ozempic- reports taking only one dose/ unable to get more, remains on Lantus and SSI   HTN- remains on losartan  PAF- remains on amiodarone  Asthma- followed by pulmonary, remains on albuterol/symbicort/Breo  Depression- remains on Zoloft and Seroquel  Vitamin B12 deficiency- followed by hematology, receiving monthly injections  GERD- remains on Protonix  Surgical history:  Hysterectomy in 20's  Hernia repair  Knee surgery- Left ACL/PCL repair 2021  Procedure:  Colonoscopy 2017/ 2021  PFT- 10/2021  Mammogram- ?  PAP- ?   Diet: trying to focus on baked meats, trying to eat 1-2 servings daily No exercise program besides PT sessions.   Smoking: stopped smoking cigarettes one year ago, cannot recall how many years Drinking: will ave one liquor drink per week Drugs: admits to smoking marijuanan a few times a month  Does not have living will.   Review of Systems:  Review of Systems  Constitutional:  Negative for fever and malaise/fatigue.  HENT:  Negative for congestion and sore throat.   Eyes:  Negative for blurred vision and double vision.  Respiratory:  Positive for wheezing.  Negative for cough and shortness of breath.   Cardiovascular:  Negative for chest pain, palpitations and leg swelling.  Gastrointestinal:  Positive for heartburn. Negative for abdominal pain, blood in stool, constipation, diarrhea, nausea  and vomiting.  Genitourinary:  Negative for dysuria and frequency.       Incontinence   Musculoskeletal:  Positive for falls and joint pain.  Skin:  Negative for rash.  Neurological:  Positive for weakness. Negative for dizziness and headaches.  Psychiatric/Behavioral:  Positive for depression and memory loss. The patient is not nervous/anxious and does not have insomnia.     Past Medical History:  Diagnosis Date   Anemia 09/2015   Anemia    Arthritis    "knees" (04/23/2017)   Asthma    "teens; went away; came back" (04/23/2017)   B12 deficiency anemia 04/23/2017   Chronic bronchitis (HCC)    Chronic low back pain with sciatica    Chronic lower back pain    Diabetes mellitus without complication ([place])    Diabetes type 2, controlled ([place])    Elevated ferritin level    Fatty liver    GERD (gastroesophageal reflux disease)    GERD with stricture    Headache    "1-2/wk" (04/23/2017)   History of blood transfusion "plenty"   "related to anemia" (04/23/2017)   Hypertension    Inguinal hernia    Low back pain    Migraine    "1-2/month" (04/23/2017)   OA (osteoarthritis) of knee--left    Symptomatic anemia 04/23/2017   Vitamin B 12 deficiency    Past Surgical History:  Procedure Laterality Date   ABDOMINAL HYSTERECTOMY  YRS AGO   COMPLETE   CESAREAN SECTION  1998; 2002   ESOPHAGEAL DILATION  02/2020   by Dr Shana Chute   INGUINAL HERNIA REPAIR Bilateral 1980s    "total of 4 surgeries" (04/23/2017)   IR GASTROSTOMY TUBE MOD SED  12/13/2020   KNEE ARTHROSCOPY WITH MEDIAL MENISECTOMY Left 01/30/2018   Procedure: LEFT KNEE ARTHROSCOPY WITH PARTIAL LATERAL MENISCECTOMY;  Surgeon: Mcarthur Rossetti, MD;  Location: [facility];  Service: Orthopedics;  Laterality: Left;   KNEE CARTILAGE SURGERY Left    SUBQ ICD IMPLANT N/A 09/13/2021   Procedure: SUBQ ICD IMPLANT;  Surgeon: Courtney Haw, MD;  Location: [facility];  Service: Cardiovascular;  Laterality: N/A;   TUBAL LIGATION   2002   Social History:   reports that she has quit smoking. Her smoking use included cigarettes. She has never used smokeless tobacco. She reports that she does not currently use alcohol. She reports current drug use. Drug: Marijuana.  Family History  Problem Relation Age of Onset   Stroke Mother    Diabetes Mother    Diabetes Father    Stroke Maternal Grandmother    Diabetes Maternal Grandmother    Anemia Paternal Grandmother    Valvular heart disease Paternal Grandmother    Hypertension Mother    Prostate cancer Maternal Uncle        ? intestinal also    Medications: Patient's Medications  New Prescriptions   No medications on file  Previous Medications   ACCU-CHEK GUIDE TEST STRIP    USE 1 STRIP THREE TIMES DAILY   ALBUTEROL (VENTOLIN HFA) 108 (90 BASE) MCG/ACT INHALER    Inhale 2 puffs into the lungs every 6 (six) hours as needed for wheezing or shortness of breath.   BD PEN NEEDLE NANO 2ND GEN 32G X 4 MM MISC    3 (  three) times daily. as directed   BLOOD GLUCOSE MONITORING SUPPL (ACCU-CHEK GUIDE ME) W/DEVICE KIT    SMARTSIG:Via Meter   CARVEDILOL (COREG) 6.25 MG TABLET    Take 1 tablet (6.25 mg total) by mouth 2 (two) times daily with a meal.   DIVALPROEX (DEPAKOTE) 250 MG DR TABLET    Take 2 tablets (500 mg total) by mouth 3 (three) times daily.   DOCUSATE SODIUM (COLACE) 100 MG CAPSULE    Take 2 capsules (200 mg total) by mouth daily.   FLUTICASONE FUROATE-VILANTEROL (BREO ELLIPTA) 200-25 MCG/ACT AEPB    Inhale 1 puff into the lungs daily.   FLUTICASONE FUROATE-VILANTEROL (BREO ELLIPTA) 200-25 MCG/ACT AEPB    Inhale 1 puff into the lungs daily.   HUMALOG KWIKPEN 100 UNIT/ML KWIKPEN    14-15 Units 3 (three) times daily before meals.   INSULIN SYRINGE-NEEDLE U-100 (INSULIN SYRINGE .5CC/31GX5/16") 31G X 5/16" 0.5 ML MISC    3 (three) times daily.   LANTUS SOLOSTAR 100 UNIT/ML SOLOSTAR PEN    Inject 30 Units into the skin at bedtime.   LINZESS 290 MCG CAPS CAPSULE    Take 290  mcg by mouth daily as needed (Constipation).   LOSARTAN (COZAAR) 25 MG TABLET    Take 1 tablet (25 mg total) by mouth daily.   MELOXICAM (MOBIC) 7.5 MG TABLET    Take 7.5 mg by mouth daily.   PANTOPRAZOLE (PROTONIX) 40 MG TABLET    Take 1 tablet (40 mg total) by mouth 2 (two) times daily.   QUETIAPINE (SEROQUEL) 25 MG TABLET    Take 1 tablet (25 mg total) by mouth at bedtime.   SERTRALINE (ZOLOFT) 25 MG TABLET    TAKE 1 TABLET(25 MG) BY MOUTH DAILY   SYMBICORT 160-4.5 MCG/ACT INHALER    Inhale 2 puffs into the lungs in the morning and at bedtime.  Modified Medications   No medications on file  Discontinued Medications   No medications on file    Physical Exam:  There were no vitals filed for this visit. There is no height or weight on file to calculate BMI. Wt Readings from Last 3 Encounters:  11/08/21 249 lb 3.2 oz (113 kg)  10/03/21 252 lb (114.3 kg)  09/25/21 251 lb (113.9 kg)    Physical Exam Vitals reviewed.  Constitutional:      General: She is not in acute distress. HENT:     Head: Normocephalic.  Eyes:     General:        Right eye: No discharge.        Left eye: No discharge.  Cardiovascular:     Rate and Rhythm: Normal rate. Rhythm irregular.     Pulses: Normal pulses.     Heart sounds: Normal heart sounds.  Pulmonary:     Effort: Pulmonary effort is normal. No respiratory distress.     Breath sounds: Normal breath sounds. No wheezing.  Chest:     Comments: ICD to left midaxillary, swelling and tenderness around site, no skin breakdown or warmth Abdominal:     General: Bowel sounds are normal. There is no distension.     Palpations: Abdomen is soft.     Tenderness: There is no abdominal tenderness.  Musculoskeletal:     Cervical back: Neck supple.     Right lower leg: No edema.     Left lower leg: No edema.  Skin:    General: Skin is warm and dry.     Capillary Refill: Capillary refill  takes less than 2 seconds.  Neurological:     General: No focal  deficit present.     Mental Status: She is alert. Mental status is at baseline.     Motor: Weakness present.     Gait: Gait abnormal.  Psychiatric:        Mood and Affect: Mood normal.        Behavior: Behavior normal.     Comments: Very pleasant, follows commands, trouble giving specific details during examination     Labs reviewed: Basic Metabolic Panel: Recent Labs    03/31/21 1303 06/19/21 0943  NA 138 133*  K 4.6 3.9  CL 101 101  CO2 21 22  GLUCOSE 176* 287*  BUN 12 12  CREATININE 0.66 0.79  CALCIUM 9.2 9.2   Liver Function Tests: Recent Labs    06/19/21 0943  AST 15  ALT 18  ALKPHOS 111  BILITOT 0.7  PROT 7.8  ALBUMIN 3.9   Recent Labs    06/19/21 0943  LIPASE 33   No results for input(s): "AMMONIA" in the last 8760 hours. CBC: Recent Labs    03/31/21 1303 06/19/21 0943  WBC 7.1 6.5  HGB 12.4 14.1  HCT 40.8 44.6  MCV 75* 72.4*  PLT 248 237   Lipid Panel: No results for input(s): "CHOL", "HDL", "LDLCALC", "TRIG", "CHOLHDL", "LDLDIRECT" in the last 8760 hours. TSH: No results for input(s): "TSH" in the last 8760 hours. A1C: Lab Results  Component Value Date   HGBA1C 8.1 (H) 09/14/2021     Assessment/Plan 1. Anoxic brain injury [facility]) - s/p cardiac arrest 12/01/2020 - impaired memory, cannot complete ADLs without assistance - discharged from Palliative  2. Controlled type 2 diabetes mellitus with hyperglycemia, with long-term current use of insulin (HCC) - A1c 9.6 02/08/2022 - cont Lantus 30 units qhs and SSI - recommend dietary consult with nutritionist - cont losartan - foot exam next visit and discuss diabetic eye exam  - urine microalbumin- future - CBC with Differential/Platelet - CMP with eGFR([facility]) - Hemoglobin A1c  3. Reactive depression - related to anoxic brain injury - no mood changes - cont Depakote, Seroquel and Zoloft  4. Essential hypertension - BUN/creat 15/0.94 02/08/2022 - cont losartan and carvedilol  5.  ICD (implantable cardioverter-defibrillator) in place - followed by Dr. Curt Bears, -  s/p ICD implant 09/13/2021  - continues to have large hematoma to left midaxillary line  6. Gastroesophageal reflux disease with esophagitis and hemorrhage - hgb 12.8 02/08/2022 - cont Protonix  7. Paroxysmal A-fib (HCC) - HR controlled with amiodarone  - off anticoagulation  8. Mild intermittent asthma without complication - followed by pulmonary - PFT 10/2021 - cont albuterol/symbicort/Breo  9. Vitamin B12 deficiency - followed by hematology - cont Vit B12 injections monthly  10. MCI (mild cognitive impairment) with memory loss - see above - cannot complete basic ADLs without family assistance - disability approved, will complete PCS paperwork - MMSE- future  11. Unstable gait - frequent falls - also has chronic knee pain - followed by rehab medicine/ Dr. Tessa Lerner - cont PT  12. Frequent falls - see above  13. Chronic knee pain - followed by Dr. Holley Raring for chronic pain - unstable gait - H/o Left PCL/ACL surgery 2021 - cont Mobic - consider ortho consult in future  14. Mixed stress and urge incontinence - avoid caffeine - recommend scheduled toilet times- q2hrs  Future labs/tests: Lipid panel, MMSE, foot exam, discuss diabetic eye exam, urine microalbumin  Total time:  61 minutes. Greater than 50% of total time spent doing patient education regarding anoxic brain injury, T2DM, memory loss, unstable gait, medication management, and health maintenance.     Next appt: 03/01/2022  Windell Moulding, [facility] [phone]

## 2022-02-08 NOTE — Patient Instructions (Addendum)
Please bring medication bottles to next appointment with me  Check sugars 3 times a day before meals and at bedtime- record the reading and bring next visit  Try eating a snack in afternoon   Ask mom about inhalers  Continue to follow Dr. Holley Raring for pain control  Try putting alerts in your cell phone to help you remember things

## 2022-02-08 NOTE — Telephone Encounter (Signed)
Courtney Davies requested a PCS Form to be completed.   Form Completed and placed in her folder to review and sign.   To be faxed to [facility] once completed Fax:[phone]

## 2022-02-09 DIAGNOSIS — E538 Deficiency of other specified B group vitamins: Secondary | ICD-10-CM | POA: Insufficient documentation

## 2022-02-09 DIAGNOSIS — J4520 Mild intermittent asthma, uncomplicated: Secondary | ICD-10-CM | POA: Insufficient documentation

## 2022-02-09 LAB — COMPLETE METABOLIC PANEL WITH GFR
AG Ratio: 1.4 (calc) (ref 1.0–2.5)
ALT: 11 U/L (ref 6–29)
AST: 13 U/L (ref 10–35)
Albumin: 4.2 g/dL (ref 3.6–5.1)
Alkaline phosphatase (APISO): 108 U/L (ref 31–125)
BUN: 15 mg/dL (ref 7–25)
CO2: 22 mmol/L (ref 20–32)
Calcium: 9.2 mg/dL (ref 8.6–10.2)
Chloride: 103 mmol/L (ref 98–110)
Creat: 0.94 mg/dL (ref 0.50–0.99)
Globulin: 3.1 g/dL (calc) (ref 1.9–3.7)
Glucose, Bld: 134 mg/dL — ABNORMAL HIGH (ref 65–99)
Potassium: 4 mmol/L (ref 3.5–5.3)
Sodium: 136 mmol/L (ref 135–146)
Total Bilirubin: 0.4 mg/dL (ref 0.2–1.2)
Total Protein: 7.3 g/dL (ref 6.1–8.1)
eGFR: 76 mL/min/{1.73_m2} (ref >=60)

## 2022-02-09 LAB — CBC WITH DIFFERENTIAL/PLATELET
Absolute Monocytes: 528 cells/uL (ref 200–950)
Basophils Absolute: 28 cells/uL (ref 0–200)
Basophils Relative: 0.5 %
Eosinophils Absolute: 99 cells/uL (ref 15–500)
Eosinophils Relative: 1.8 %
HCT: 41.1 % (ref 35.0–45.0)
Hemoglobin: 12.8 g/dL (ref 11.7–15.5)
Lymphs Abs: 2118 cells/uL (ref 850–3900)
MCH: 22.9 pg — ABNORMAL LOW (ref 27.0–33.0)
MCHC: 31.1 g/dL — ABNORMAL LOW (ref 32.0–36.0)
MCV: 73.4 fL — ABNORMAL LOW (ref 80.0–100.0)
MPV: 11.5 fL (ref 7.5–12.5)
Monocytes Relative: 9.6 %
Neutro Abs: 2728 cells/uL (ref 1500–7800)
Neutrophils Relative %: 49.6 %
Platelets: 262 10*3/uL (ref 140–400)
RBC: 5.6 10*6/uL — ABNORMAL HIGH (ref 3.80–5.10)
RDW: 13.8 % (ref 11.0–15.0)
Total Lymphocyte: 38.5 %
WBC: 5.5 10*3/uL (ref 3.8–10.8)

## 2022-02-09 LAB — HEMOGLOBIN A1C
Hgb A1c MFr Bld: 9.6 % of total Hgb — ABNORMAL HIGH (ref <5.7)
Mean Plasma Glucose: 229 mg/dL
eAG (mmol/L): 12.7 mmol/L

## 2022-02-15 NOTE — Telephone Encounter (Signed)
No Longer [employer].   PCS Form to be faxed to [facility] Fax:[phone]   [facility] #: [phone]  Form Completed and faxed.

## 2022-03-01 ENCOUNTER — Ambulatory Visit: Payer: Medicaid Other | Admitting: Orthopedic Surgery

## 2022-03-01 ENCOUNTER — Encounter: Payer: Self-pay | Admitting: Orthopedic Surgery

## 2022-03-01 VITALS — BP 130/78 | HR 76 | Temp 97.2°F | Resp 20 | Ht 65.5 in | Wt 253.4 lb

## 2022-03-01 DIAGNOSIS — I10 Essential (primary) hypertension: Secondary | ICD-10-CM

## 2022-03-01 DIAGNOSIS — J4520 Mild intermittent asthma, uncomplicated: Secondary | ICD-10-CM

## 2022-03-01 DIAGNOSIS — G3184 Mild cognitive impairment, so stated: Secondary | ICD-10-CM

## 2022-03-01 DIAGNOSIS — G931 Anoxic brain damage, not elsewhere classified: Secondary | ICD-10-CM

## 2022-03-01 DIAGNOSIS — E1165 Type 2 diabetes mellitus with hyperglycemia: Secondary | ICD-10-CM

## 2022-03-01 DIAGNOSIS — F329 Major depressive disorder, single episode, unspecified: Secondary | ICD-10-CM | POA: Diagnosis not present

## 2022-03-01 DIAGNOSIS — F321 Major depressive disorder, single episode, moderate: Secondary | ICD-10-CM

## 2022-03-01 DIAGNOSIS — Z794 Long term (current) use of insulin: Secondary | ICD-10-CM

## 2022-03-01 DIAGNOSIS — I480 Paroxysmal atrial fibrillation: Secondary | ICD-10-CM

## 2022-03-01 DIAGNOSIS — Z95810 Presence of automatic (implantable) cardiac defibrillator: Secondary | ICD-10-CM

## 2022-03-01 DIAGNOSIS — F99 Mental disorder, not otherwise specified: Secondary | ICD-10-CM

## 2022-03-01 DIAGNOSIS — E538 Deficiency of other specified B group vitamins: Secondary | ICD-10-CM

## 2022-03-01 DIAGNOSIS — F5105 Insomnia due to other mental disorder: Secondary | ICD-10-CM

## 2022-03-01 MED ORDER — SERTRALINE HCL 50 MG PO TABS: ORAL_TABLET | ORAL | 5 refills | Status: DC

## 2022-03-01 MED ORDER — HYDROXYZINE PAMOATE 25 MG PO CAPS: 25.0000 mg | ORAL_CAPSULE | Freq: Every evening | ORAL | 0 refills | Status: DC | PRN

## 2022-03-01 NOTE — Progress Notes (Signed)
Careteam: Patient Care Team: Yvonna Alanis, NP as PCP - General (Adult Health Nurse Practitioner) Jettie Booze, MD as PCP - Cardiology (Cardiology) Constance Haw, MD as PCP - Electrophysiology (Cardiology) [place], [place], PA-C [facility])  Seen by: Windell Moulding, AGNP-C  PLACE OF SERVICE:  [place]  Advanced Directive information    Allergies  Allergen Reactions   Latex Hives   Other Other (See Comments), Anaphylaxis, Swelling and Hives    Hair glue causes throat to close and hives "glue" Hair glue causes throat to close    Codeine Nausea And Vomiting    Was on an empty stomach.   Nitrofurantoin Rash    Chief Complaint  Patient presents with   Medical Management of Chronic Issues    4 Weeks Follow up. Needs to Discuss the following care gaps as follows: Foot Exam, Eye Exam, Diabetic kidney evaluation, Pap Smear and Flu shot.     HPI: Patient is a 46 y.o. female seen today for medical management of chronic conditions.   Anoxic brain injury/ cognitive impairment- [place] LIFTSS form completed and faxed, MMSE score 10/30- incorrect clock, she is still struggling with ADLs requiring family assistance, mother is still helping with medication management.   Insomnia- reports not sleeping well past 2 weeks, she has had bad dreams, will feel anxious at night, admits to watching television/cell phone surfing prior to bedtime  She reports diabetes management with [facility], she is still taking starter pack of Ozempic, reports not taking insulin, denies hypoglycemias, she is very confused as to which doctor is managing which health condition, she is requesting to simplify this process, I advised her that [facility] can management diabetes and primary care issues- she needs to decide who she would like to follow  Still doing PT/OT. She has had a few falls without injury. Does not walk with assistive device.   Review of Systems:  Review of Systems   Constitutional:  Negative for chills, fever, malaise/fatigue and weight loss.  HENT:  Negative for congestion.   Eyes:  Negative for blurred vision and double vision.  Respiratory:  Negative for cough, shortness of breath and wheezing.   Cardiovascular:  Negative for chest pain, orthopnea and leg swelling.  Gastrointestinal:  Negative for abdominal pain, blood in stool, constipation, diarrhea, heartburn, nausea and vomiting.  Genitourinary:  Positive for frequency. Negative for dysuria and hematuria.  Musculoskeletal:  Positive for falls and joint pain.  Skin:  Negative for rash.  Neurological:  Positive for weakness. Negative for dizziness and headaches.  Endo/Heme/Allergies:  Negative for polydipsia.  Psychiatric/Behavioral:  Positive for depression and memory loss. Negative for substance abuse and suicidal ideas. The patient is nervous/anxious and has insomnia.     Past Medical History:  Diagnosis Date   Anemia 09/2015   Anemia    Arthritis    "knees" (04/23/2017)   Asthma    "teens; went away; came back" (04/23/2017)   B12 deficiency anemia 04/23/2017   Chronic bronchitis (HCC)    Chronic low back pain with sciatica    Chronic lower back pain    Diabetes mellitus without complication ([place])    Diabetes type 2, controlled ([place])    Elevated ferritin level    Fatty liver    GERD (gastroesophageal reflux disease)    GERD with stricture    Headache    "1-2/wk" (04/23/2017)   History of blood transfusion "plenty"   "related to anemia" (04/23/2017)   Hypertension  Inguinal hernia    Low back pain    Migraine    "1-2/month" (04/23/2017)   OA (osteoarthritis) of knee--left    Symptomatic anemia 04/23/2017   Vitamin B 12 deficiency    Past Surgical History:  Procedure Laterality Date   ABDOMINAL HYSTERECTOMY  YRS AGO   COMPLETE   CESAREAN SECTION  1998; 2002   ESOPHAGEAL DILATION  02/2020   by Dr Patrcia Dolly HERNIA REPAIR Bilateral 1980s    "total of 4 surgeries"  (04/23/2017)   IR GASTROSTOMY TUBE MOD SED  12/13/2020   KNEE ARTHROSCOPY WITH MEDIAL MENISECTOMY Left 01/30/2018   Procedure: LEFT KNEE ARTHROSCOPY WITH PARTIAL LATERAL MENISCECTOMY;  Surgeon: Mcarthur Rossetti, MD;  Location: [facility];  Service: Orthopedics;  Laterality: Left;   KNEE CARTILAGE SURGERY Left    SUBQ ICD IMPLANT N/A 09/13/2021   Procedure: SUBQ ICD IMPLANT;  Surgeon: Constance Haw, MD;  Location: [facility];  Service: Cardiovascular;  Laterality: N/A;   TUBAL LIGATION  2002   Social History:   reports that she has quit smoking. Her smoking use included cigarettes. She has never used smokeless tobacco. She reports that she does not currently use alcohol. She reports current drug use. Drug: Marijuana.  Family History  Problem Relation Age of Onset   Stroke Mother    Diabetes Mother    Diabetes Father    Stroke Maternal Grandmother    Diabetes Maternal Grandmother    Anemia Paternal Grandmother    Valvular heart disease Paternal Grandmother    Hypertension Mother    Prostate cancer Maternal Uncle        ? intestinal also    Medications: Patient's Medications  New Prescriptions   No medications on file  Previous Medications   ACCU-CHEK GUIDE TEST STRIP       ALBUTEROL (VENTOLIN HFA) 108 (90 BASE) MCG/ACT INHALER    Inhale 2 puffs into the lungs every 6 (six) hours as needed for wheezing or shortness of breath.   AMIODARONE (PACERONE) 200 MG TABLET    Take 200 mg by mouth daily.   BD PEN NEEDLE NANO 2ND GEN 32G X 4 MM MISC    3 (three) times daily. as directed   BLOOD GLUCOSE MONITORING SUPPL (ACCU-CHEK GUIDE ME) W/DEVICE KIT    SMARTSIG:Via Meter   CARVEDILOL (COREG) 6.25 MG TABLET    Take 1 tablet (6.25 mg total) by mouth 2 (two) times daily with a meal.   FLUTICASONE FUROATE-VILANTEROL (BREO ELLIPTA) 200-25 MCG/ACT AEPB    Inhale 1 puff into the lungs daily.   HUMALOG KWIKPEN 100 UNIT/ML KWIKPEN    14-15 Units 3 (three) times daily before meals.    INSULIN SYRINGE-NEEDLE U-100 (INSULIN SYRINGE .5CC/31GX5/16") 31G X 5/16" 0.5 ML MISC    3 (three) times daily.   KETOCONAZOLE (NIZORAL) 2 % CREAM    Apply 1 Application topically daily.   LANTUS SOLOSTAR 100 UNIT/ML SOLOSTAR PEN    Inject 30 Units into the skin at bedtime.   LINZESS 290 MCG CAPS CAPSULE    Take 290 mcg by mouth daily as needed (Constipation).   LOSARTAN (COZAAR) 25 MG TABLET    Take 1 tablet (25 mg total) by mouth daily.   MELOXICAM (MOBIC) 7.5 MG TABLET    Take 7.5 mg by mouth daily.   PANTOPRAZOLE (PROTONIX) 40 MG TABLET    Take 1 tablet (40 mg total) by mouth 2 (two) times daily.   QUETIAPINE (SEROQUEL) 25 MG  TABLET    Take 1 tablet (25 mg total) by mouth at bedtime.   SERTRALINE (ZOLOFT) 25 MG TABLET    TAKE 1 TABLET(25 MG) BY MOUTH DAILY   SYMBICORT 160-4.5 MCG/ACT INHALER    Inhale 2 puffs into the lungs in the morning and at bedtime.  Modified Medications   No medications on file  Discontinued Medications   No medications on file    Physical Exam:  There were no vitals filed for this visit. There is no height or weight on file to calculate BMI. Wt Readings from Last 3 Encounters:  02/08/22 245 lb (111.1 kg)  11/08/21 249 lb 3.2 oz (113 kg)  10/03/21 252 lb (114.3 kg)    Physical Exam Vitals reviewed.  Constitutional:      General: She is not in acute distress. HENT:     Head: Normocephalic.  Eyes:     General:        Right eye: No discharge.        Left eye: No discharge.  Cardiovascular:     Rate and Rhythm: Normal rate and regular rhythm.     Pulses:          Dorsalis pedis pulses are 2+ on the right side and 2+ on the left side.       Posterior tibial pulses are 2+ on the right side and 2+ on the left side.     Heart sounds: Normal heart sounds.  Pulmonary:     Effort: Pulmonary effort is normal. No respiratory distress.     Breath sounds: Normal breath sounds. No wheezing.  Abdominal:     General: Bowel sounds are normal. There is no  distension.     Palpations: Abdomen is soft.     Tenderness: There is no abdominal tenderness.  Musculoskeletal:     Cervical back: Neck supple.     Right lower leg: No edema.     Left lower leg: No edema.     Right foot: Normal range of motion.     Left foot: Normal range of motion.  Feet:     Right foot:     Protective Sensation: 7 sites tested.  10 sites sensed.     Toenail Condition: Right toenails are normal.     Left foot:     Protective Sensation: 7 sites tested.  10 sites sensed.     Skin integrity: Skin integrity normal.     Toenail Condition: Left toenails are normal.  Skin:    General: Skin is warm and dry.     Capillary Refill: Capillary refill takes less than 2 seconds.  Neurological:     General: No focal deficit present.     Mental Status: She is alert. Mental status is at baseline.     Motor: No weakness.     Gait: Gait normal.  Psychiatric:        Behavior: Behavior normal.     Labs reviewed: Basic Metabolic Panel: Recent Labs    03/31/21 1303 06/19/21 0943 02/08/22 1104  NA 138 133* 136  K 4.6 3.9 4.0  CL 101 101 103  CO2 _0 GLUCOSE 176* 287* 134*  BUN _1 CREATININE 0.66 0.79 0.94  CALCIUM 9.2 9.2 9.2   Liver Function Tests: Recent Labs    06/19/21 0943 02/08/22 1104  AST 15 13  ALT 18 11  ALKPHOS 111  --   BILITOT 0.7 0.4  PROT 7.8 7.3  ALBUMIN  3.9  --    Recent Labs    06/19/21 0943  LIPASE 33   No results for input(s): "AMMONIA" in the last 8760 hours. CBC: Recent Labs    03/31/21 1303 06/19/21 0943 02/08/22 1104  WBC 7.1 6.5 5.5  NEUTROABS  --   --  2,728  HGB 12.4 14.1 12.8  HCT 40.8 44.6 41.1  MCV 75* 72.4* 73.4*  PLT 248 237 262   Lipid Panel: No results for input(s): "CHOL", "HDL", "LDLCALC", "TRIG", "CHOLHDL", "LDLDIRECT" in the last 8760 hours. TSH: No results for input(s): "TSH" in the last 8760 hours. A1C: Lab Results  Component Value Date   HGBA1C 9.6 (H) 02/08/2022      Assessment/Plan 1. Anoxic brain injury [facility]) - s/p cardiac arrest 12/01/2020 - impaired memory, cannot complete ADLs without assistance - [place] LIFTSS for completed  2. Controlled type 2 diabetes mellitus with hyperglycemia, with long-term current use of insulin (HCC) - A1c 9.6 02/08/2022  - reports seeing [place] for diabetes - she also reports taking Ozempic and not insulin - advised patient to have medical record transferred if she would like [place] to manage - discussed need for diabetic eye exam - foot exam done today- 7/10 both feet - Microalbumin/Creatinine Ratio, Urine  3. Reactive depression - ongoing - PHQ 9 score 22 today, no plan to harm self - will increase Zoloft to 50 mg - sertraline (ZOLOFT) 50 MG tablet; TAKE 1 TABLET(25 MG) BY MOUTH DAILY  Dispense: 30 tablet; Refill: 5  4. Essential hypertension - controlled - cont losartan and carvedilol  5. ICD (implantable cardioverter-defibrillator) in place - followed by Dr. Curt Bears, -  s/p ICD implant 09/13/2021   6. Paroxysmal A-fib (HCC) - HR controlled with amiodarone - off anticoagulation  7. Mild intermittent asthma without complication - followed by pulmonary - cont albuterol/symbicort/Breo  8. Vitamin B12 deficiency - recently seen by hematology- recommended B12 monthly   9. MCI (mild cognitive impairment) with memory loss - MMSE 10/30 - family helping with medication management/ADLs -  LIFTSS completed  10. Insomnia due to other mental disorder ? Insomnia versus OSA - OSA noted in 2022 - will see if improved anxiety management will help promote sleep  - hydrOXYzine (VISTARIL) 25 MG capsule; Take 1 capsule (25 mg total) by mouth at bedtime as needed (as needed for increased insomnia/anxiety).  Dispense: 30 capsule; Refill: 0  Total time: 36 minutes. Greater than 50% of total time spent doing patient education regarding T2DM, insomnia, anxiety/depression, and memory impairment.     Next  appt: 05/31/2022  Windell Moulding, [facility] [phone]

## 2022-03-01 NOTE — Patient Instructions (Addendum)
Please call [place] LIFT[employer] (program that provides help at home)- [place]- (fax#[phone]  Please schedule to have eyes examined with eye doctor of your choosing- we need this done yearly to evaluate for diabetic retinopathy  Call gynecology to schedule cyst removal   Zoloft has been increased to 50 mg daily now  Hydroxyzine has been prescribed for panic attacks or INSOMNIA- DO NOT TAKE EVERY NIGHT/ ONLY FOR EMERGENCIES!  Please call [facility] and have them fax over medical records to [facility]

## 2022-03-01 NOTE — Addendum Note (Signed)
Addended byWindell Moulding E on: 03/01/2022 12:56 PM   Modules accepted: Orders

## 2022-03-02 LAB — MICROALBUMIN / CREATININE URINE RATIO
Creatinine, Urine: 233 mg/dL (ref 20–275)
Microalb Creat Ratio: 15 mcg/mg creat (ref <30)
Microalb, Ur: 3.6 mg/dL

## 2022-03-07 ENCOUNTER — Encounter: Payer: Medicaid Other | Admitting: Physical Medicine & Rehabilitation

## 2022-03-15 ENCOUNTER — Ambulatory Visit (INDEPENDENT_AMBULATORY_CARE_PROVIDER_SITE_OTHER): Payer: Medicaid Other

## 2022-03-15 DIAGNOSIS — I469 Cardiac arrest, cause unspecified: Secondary | ICD-10-CM | POA: Diagnosis not present

## 2022-03-15 LAB — CUP PACEART REMOTE DEVICE CHECK
Battery Remaining Percentage: 95 %
Date Time Interrogation Session: 20231130070300
Implantable Lead Connection Status: 753985
Implantable Lead Implant Date: 20230531
Implantable Lead Location: 753862
Implantable Lead Model: 3501
Implantable Lead Serial Number: 235497
Implantable Pulse Generator Implant Date: 20230531
Pulse Gen Serial Number: 177788

## 2022-03-18 ENCOUNTER — Other Ambulatory Visit: Payer: Self-pay | Admitting: Physical Medicine & Rehabilitation

## 2022-03-18 DIAGNOSIS — F321 Major depressive disorder, single episode, moderate: Secondary | ICD-10-CM

## 2022-03-18 DIAGNOSIS — F329 Major depressive disorder, single episode, unspecified: Secondary | ICD-10-CM

## 2022-03-28 ENCOUNTER — Other Ambulatory Visit: Payer: Self-pay | Admitting: Orthopedic Surgery

## 2022-03-28 DIAGNOSIS — F5105 Insomnia due to other mental disorder: Secondary | ICD-10-CM

## 2022-03-29 NOTE — Telephone Encounter (Signed)
This prescription was not intended for daily use. Please try to only use when needed. I will refill one more time.

## 2022-03-29 NOTE — Telephone Encounter (Signed)
MyChart message sent to patient.

## 2022-03-29 NOTE — Telephone Encounter (Signed)
Patient is requesting refill on medication Hydroxyzine. Patient last refill dated  03/01/2022. Patient was given 30 capsules. Medication pend and sent to PCP Yvonna Alanis, NP for approval.

## 2022-04-02 ENCOUNTER — Encounter: Payer: Self-pay | Admitting: Family

## 2022-04-02 ENCOUNTER — Ambulatory Visit (INDEPENDENT_AMBULATORY_CARE_PROVIDER_SITE_OTHER): Payer: Medicaid Other | Admitting: Family

## 2022-04-02 ENCOUNTER — Ambulatory Visit: Payer: Medicaid Other | Admitting: Orthopedic Surgery

## 2022-04-02 VITALS — BP 116/70 | HR 82 | Temp 96.9°F | Resp 18 | Ht 65.5 in | Wt 263.4 lb

## 2022-04-02 DIAGNOSIS — M25521 Pain in right elbow: Secondary | ICD-10-CM | POA: Diagnosis not present

## 2022-04-02 DIAGNOSIS — L02421 Furuncle of right axilla: Secondary | ICD-10-CM

## 2022-04-02 DIAGNOSIS — R200 Anesthesia of skin: Secondary | ICD-10-CM | POA: Diagnosis not present

## 2022-04-02 DIAGNOSIS — M25421 Effusion, right elbow: Secondary | ICD-10-CM | POA: Diagnosis not present

## 2022-04-02 DIAGNOSIS — R202 Paresthesia of skin: Secondary | ICD-10-CM

## 2022-04-02 MED ORDER — DOXYCYCLINE HYCLATE 100 MG PO TABS: 100.0000 mg | ORAL_TABLET | Freq: Two times a day (BID) | ORAL | 0 refills | Status: AC

## 2022-04-02 NOTE — Progress Notes (Signed)
Provider: Leigh Kaeding FNP-C  Courtney Alanis, NP  [name], [name], [name], [name], Courtney Davies [facility] Medicine)  Extended Emergency Contact Information Primary Emergency Contact: Davies,Courtney Address: [address]          [place], [place] [address] Johnnette Litter of [place] Phone: [phone] Mobile Phone: [phone] Relation: Mother Secondary Emergency Contact: Davies,Courtney Mobile Phone: [phone] Relation: Father  Code Status:  Full Code  Goals of care: Advanced Directive information    04/02/2022    3:40 PM  Advanced Directives  Does Patient Have a Medical Advance Directive? No  Would patient like information on creating a medical advance directive? No - Patient declined     Chief Complaint  Patient presents with   Acute Visit    Patient complains of hand going numb for past couple weeks. Patient complains of boil under right arm.     HPI:  Pt is a 46 y.o. female seen today for an acute visit for evaluation of right armpit boil.states was treated with oral antibiotics in the urgent care.does not remember the name of the antibiotics that was given but has taken it for 7 days.states selling has improved but not resolved and still painful.  Also complains of both hands numbness and tingling Has had right side neck pain for couple of weeks.Numbness and tingling worst with sleeping on the side.  She also complains of right elbow pain and swelling for couple of days.she denies any overuse,or pressure on elbow area.also denies any fever or chills.  States working with Physical Therapist for knee pain.  Past Medical History:  Diagnosis Date   Anemia 09/2015   Anemia    Arthritis    "knees" (04/23/2017)   Asthma    "teens; went away; came back" (04/23/2017)   B12  deficiency anemia 04/23/2017   Chronic bronchitis (HCC)    Chronic low back pain with sciatica    Chronic lower back pain    Diabetes mellitus without complication ([place])    Diabetes type 2, controlled ([place])    Elevated ferritin level    Fatty liver    GERD (gastroesophageal reflux disease)    GERD with stricture    Headache    "1-2/wk" (04/23/2017)   History of blood transfusion "plenty"   "related to anemia" (04/23/2017)   Hypertension    Inguinal hernia    Low back pain    Migraine    "1-2/month" (04/23/2017)   OA (osteoarthritis) of knee--left    Symptomatic anemia 04/23/2017   Vitamin B 12 deficiency    Past Surgical History:  Procedure Laterality Date   ABDOMINAL HYSTERECTOMY  YRS AGO   COMPLETE   CESAREAN SECTION  1998; 2002   ESOPHAGEAL DILATION  02/2020   by Dr Shana Chute   INGUINAL HERNIA REPAIR Bilateral 1980s    "total of 4 surgeries" (04/23/2017)   IR GASTROSTOMY TUBE MOD SED  12/13/2020   KNEE ARTHROSCOPY WITH MEDIAL MENISECTOMY Left 01/30/2018   Procedure: LEFT KNEE ARTHROSCOPY WITH PARTIAL LATERAL MENISCECTOMY;  Surgeon: Mcarthur Rossetti, MD;  Location: [facility];  Service: Orthopedics;  Laterality: Left;   KNEE CARTILAGE SURGERY Left    SUBQ ICD IMPLANT N/A 09/13/2021   Procedure: SUBQ ICD IMPLANT;  Surgeon: Courtney Haw, MD;  Location: [facility];  Service: Cardiovascular;  Laterality: N/A;   TUBAL LIGATION  2002    Allergies  Allergen Reactions   Latex Hives   Other Other (See Comments), Anaphylaxis, Swelling and Hives    Hair glue causes throat to close and hives "glue" Hair glue causes throat to close    Codeine Nausea And Vomiting    Was on an empty stomach.   Nitrofurantoin Rash    Outpatient Encounter Medications as of 04/02/2022  Medication Sig   ACCU-CHEK GUIDE test strip    albuterol (VENTOLIN HFA) 108 (90 Base) MCG/ACT inhaler Inhale 2 puffs into the lungs every 6 (six) hours as needed for wheezing or shortness of breath.    amiodarone (PACERONE) 200 MG tablet Take 200 mg by mouth daily.   BD PEN NEEDLE NANO 2ND GEN 32G X 4 MM MISC 3 (three) times daily. as directed   Blood Glucose Monitoring Suppl (ACCU-CHEK GUIDE ME) w/Device KIT SMARTSIG:Via Meter   carvedilol (COREG) 6.25 MG tablet Take 1 tablet (6.25 mg total) by mouth 2 (two) times daily with a meal.   fluticasone furoate-vilanterol (BREO ELLIPTA) 200-25 MCG/ACT AEPB Inhale 1 puff into the lungs daily.   HUMALOG KWIKPEN 100 UNIT/ML KwikPen 14-15 Units 3 (three) times daily before meals.   hydrOXYzine (VISTARIL) 25 MG capsule TAKE 1 CAPSULE(25 MG) BY MOUTH AT BEDTIME AS NEEDED FOR INCREASED INSOMNIA/ ANXIETY   Insulin Syringe-Needle U-100 (INSULIN SYRINGE .5CC/31GX5/16") 31G X 5/16" 0.5 ML MISC 3 (three) times daily.   ketoconazole (NIZORAL) 2 % cream Apply 1 Application topically daily.   LANTUS SOLOSTAR 100 UNIT/ML Solostar Pen Inject 30 Units into the skin at bedtime.   LINZESS 290 MCG CAPS capsule Take 290 mcg by mouth daily as needed (Constipation).   losartan (COZAAR) 25 MG tablet Take 1 tablet (25 mg total) by mouth daily.   meloxicam (MOBIC) 7.5 MG tablet Take 7.5 mg by mouth daily.   pantoprazole (PROTONIX) 40 MG tablet Take 1 tablet (40 mg total) by mouth 2 (two) times daily.   QUEtiapine (SEROQUEL) 25 MG tablet Take 1 tablet (25 mg total) by mouth at bedtime.   sertraline (ZOLOFT) 50 MG tablet TAKE 1 TABLET(25 MG) BY MOUTH DAILY   SYMBICORT 160-4.5 MCG/ACT inhaler Inhale 2 puffs into the lungs in the morning and at bedtime.   traZODone (DESYREL) 50 MG tablet Take 100 mg by mouth at bedtime as needed for sleep.   No facility-administered encounter medications on file as of 04/02/2022.    Review of Systems  Constitutional:  Negative for chills, fatigue and fever.  Respiratory:  Negative for cough, chest tightness, shortness of breath and wheezing.   Cardiovascular:  Negative for chest pain, palpitations and leg swelling.  Musculoskeletal:   Positive for arthralgias, joint swelling and neck pain. Negative for gait problem, myalgias and neck stiffness.       Right elbow swelling   Skin:  Negative for color change, pallor and rash.       Right armpit abscess s/p antibiotics treatment  Neurological:  Negative for dizziness, tremors, syncope, light-headedness and headaches.       Numbness and tingling on hands worst when lying on side at night     Immunization History  Administered Date(s) Administered   Influenza,inj,Quad PF,6+ Mos 01/23/2022   PFIZER Comirnaty(Gray Top)Covid-19 Tri-Sucrose Vaccine 06/27/2021   Pfizer Covid-19 Vaccine Bivalent Booster 59yr & up 08/23/2021   Tdap 11/07/2015   Pertinent  Health Maintenance Due  Topic Date Due   OPHTHALMOLOGY EXAM  Never done  PAP SMEAR-Modifier  Never done   HEMOGLOBIN A1C  08/10/2022   FOOT EXAM  03/02/2023   COLONOSCOPY (Pts 45-28yr Insurance coverage will need to be confirmed)  12/04/2025   INFLUENZA VACCINE  Completed      09/14/2021    7:47 AM 09/16/2021    4:30 PM 11/08/2021    2:32 PM 02/06/2022    4:13 PM 04/02/2022    3:40 PM  Fall Risk  Falls in the past year?   0 0 0  Was there an injury with Fall?    0 0  Fall Risk Category Calculator    0 0  Fall Risk Category    Low Low  Patient Fall Risk Level Moderate fall risk Low fall risk  Low fall risk Low fall risk  Patient at Risk for Falls Due to    No Fall Risks No Fall Risks  Fall risk Follow up    Falls evaluation completed Falls evaluation completed   Functional Status Survey:    Vitals:   04/02/22 1538  BP: 116/70  Pulse: 82  Resp: 18  Temp: (!) 96.9 F (36.1 C)  SpO2: 98%  Weight: 263 lb 6.4 oz (119.5 kg)  Height: 5' 5.5" (1.664 m)   Body mass index is 43.17 kg/m. Physical Exam Vitals reviewed.  Constitutional:      General: She is not in acute distress.    Appearance: Normal appearance. She is obese. She is not ill-appearing.  HENT:     Head: Normocephalic.  Eyes:     General: No  scleral icterus.       Right eye: No discharge.        Left eye: No discharge.     Conjunctiva/sclera: Conjunctivae normal.     Pupils: Pupils are equal, round, and reactive to light.  Cardiovascular:     Rate and Rhythm: Normal rate and regular rhythm.     Pulses: Normal pulses.     Heart sounds: Normal heart sounds. No murmur heard.    No friction rub. No gallop.  Pulmonary:     Effort: Pulmonary effort is normal. No respiratory distress.     Breath sounds: Normal breath sounds. No wheezing, rhonchi or rales.  Chest:     Chest wall: No tenderness.  Musculoskeletal:        General: No swelling. Normal range of motion.     Right elbow: Swelling and effusion present. Normal range of motion. Tenderness present.     Left elbow: Normal.     Cervical back: Normal range of motion. No rigidity or tenderness.     Right lower leg: No edema.     Left lower leg: No edema.  Lymphadenopathy:     Cervical: No cervical adenopathy.  Skin:    General: Skin is warm and dry.     Coloration: Skin is not pale.     Findings: No bruising, erythema or lesion.     Comments: Right axilla 2 cm abscess firm to palpation tender to palpation.No erythema or drainage noted.   Neurological:     Mental Status: She is alert and oriented to person, place, and time.     Cranial Nerves: No cranial nerve deficit.     Sensory: No sensory deficit.     Motor: No weakness.     Coordination: Coordination normal.     Gait: Gait normal.  Psychiatric:        Speech: Speech normal.     Labs reviewed: Recent Labs  06/19/21 0943 02/08/22 1104  NA 133* 136  K 3.9 4.0  CL 101 103  CO2 22 22  GLUCOSE 287* 134*  BUN 12 15  CREATININE 0.79 0.94  CALCIUM 9.2 9.2   Recent Labs    06/19/21 0943 02/08/22 1104  AST 15 13  ALT 18 11  ALKPHOS 111  --   BILITOT 0.7 0.4  PROT 7.8 7.3  ALBUMIN 3.9  --    Recent Labs    06/19/21 0943 02/08/22 1104  WBC 6.5 5.5  NEUTROABS  --  2,728  HGB 14.1 12.8  HCT 44.6  41.1  MCV 72.4* 73.4*  PLT 237 262   Lab Results  Component Value Date   TSH 2.467 12/05/2020   Lab Results  Component Value Date   HGBA1C 9.6 (H) 02/08/2022   Lab Results  Component Value Date   CHOL 163 12/02/2020   HDL 33 (L) 12/02/2020   LDLCALC 90 12/02/2020   TRIG 151 (H) 12/07/2020   CHOLHDL 4.9 12/02/2020    Significant Diagnostic Results in last 30 days:  CUP PACEART REMOTE DEVICE CHECK  Result Date: 03/15/2022 Scheduled S-ICD remote reviewed. Normal device function.  Next remote 91 days. Kathy Breach, RN, CCDS, CV Remote SolutionsSensing Configuration: Secondary Gain Setting: 1X Post Shock Pacing: ON   Assessment/Plan 1. Pain and swelling of right elbow Swollen and tender to palpation.Effusion noted. Will obtain imaging to evaluate -advised to apply for 10 -20 minutes to keep swelling down  - DG Elbow Complete Right; Future  2. Furuncle of right axilla Afebrile  Treated in the urgent care with antibiotics which improved but did not resolve. - Advised to apply warm compressor to right armpit abscess for 10-20 minutes daily  - Recommend I& D in urgent care or ED if not resolved patient very tender to touch difficult to assess Advised to start on Doxycycline as below though relaxant to take another antibiotic.  - doxycycline (VIBRA-TABS) 100 MG tablet; Take 1 tablet (100 mg total) by mouth 2 (two) times daily for 7 days.  Dispense: 14 tablet; Refill: 0  3. Numbness and tingling of both upper extremities Possible due to ongoing neck pain given that symptoms worsen with lying on her side at night.also possible related to Type 2 DM no CBG for evaluation states checks blood sugars only when she thinks it's high.Latest A1C on chart was 9.6  - Recommend follow up with Orthopedic for further evaluation of numbness and tingling of hands.   Family/ staff Communication: Reviewed plan of care with patient verbalized understanding.   Labs/tests ordered:  - DG Elbow  Complete Right; Future  Next Appointment:Return if symptoms worsen or fail to improve.   Sandrea Hughs, NP

## 2022-04-02 NOTE — Patient Instructions (Addendum)
-  apply warm compressor to right armpit abscess for 10-20 minutes daily   - apply ice pack to right elbow for 10 -20 minutes to keep swelling down   - Please get right elbow X-ray at [place] [facility] then will call you with results.

## 2022-04-04 NOTE — Progress Notes (Signed)
Remote ICD transmission.   

## 2022-04-06 ENCOUNTER — Ambulatory Visit (INDEPENDENT_AMBULATORY_CARE_PROVIDER_SITE_OTHER): Payer: Medicaid Other

## 2022-04-06 ENCOUNTER — Ambulatory Visit (INDEPENDENT_AMBULATORY_CARE_PROVIDER_SITE_OTHER): Payer: Medicaid Other | Admitting: Orthopaedic Surgery

## 2022-04-06 ENCOUNTER — Encounter: Payer: Self-pay | Admitting: Orthopaedic Surgery

## 2022-04-06 DIAGNOSIS — R200 Anesthesia of skin: Secondary | ICD-10-CM

## 2022-04-06 DIAGNOSIS — M25521 Pain in right elbow: Secondary | ICD-10-CM | POA: Diagnosis not present

## 2022-04-06 MED ORDER — PREDNISONE 10 MG (21) PO TBPK: ORAL_TABLET | ORAL | 3 refills | Status: DC

## 2022-04-06 MED ORDER — GABAPENTIN 100 MG PO CAPS: 100.0000 mg | ORAL_CAPSULE | Freq: Every evening | ORAL | 3 refills | Status: DC | PRN

## 2022-04-06 NOTE — Progress Notes (Signed)
Office Visit Note   Patient: Courtney Davies           Date of Birth: 02/05/1976           MRN: [id number] Visit Date: 04/06/2022              Requested by: Sandrea Hughs, NP [address] [place],  [place] [address] PCP: Yvonna Alanis, NP   Assessment & Plan: Visit Diagnoses:  1. Pain in right elbow   2. Bilateral hand numbness     Plan: Impression is right elbow pain unclear exactly what is going on although it seems like she is in pain when she has to use her wrist and finger flexors.  Seems like it is inflammatory in nature therefore we will place her on steroid Dosepak and gabapentin for the numbness and tingling.  Will order nerve conduction studies to assess for carpal tunnel syndrome.  She is diabetic so could be neuropathy.  Follow-Up Instructions: No follow-ups on file.   Orders:  Orders Placed This Encounter  Procedures   XR Elbow 2 Views Right   No orders of the defined types were placed in this encounter.     Procedures: No procedures performed   Clinical Data: No additional findings.   Subjective: Chief Complaint  Patient presents with   Right Elbow - Pain    HPI Courtney Davies is a 46 year old female here for evaluation of pain and swelling in the right elbow for 2 weeks.  Denies any injuries or changes in activity.  Denies history of gout and denies constitutional symptoms.  Right-hand-dominant.  Feels numbness and tingling in all 10 fingers.  Review of Systems  Constitutional: Negative.   HENT: Negative.    Eyes: Negative.   Respiratory: Negative.    Cardiovascular: Negative.   Endocrine: Negative.   Musculoskeletal: Negative.   Neurological: Negative.   Hematological: Negative.   Psychiatric/Behavioral: Negative.    All other systems reviewed and are negative.    Objective: Vital Signs: LMP 12/03/2012   Physical Exam Vitals and nursing note reviewed.  Constitutional:      Appearance: She is well-developed.  HENT:     Head: Atraumatic.      Nose: Nose normal.  Eyes:     Extraocular Movements: Extraocular movements intact.  Cardiovascular:     Pulses: Normal pulses.  Pulmonary:     Effort: Pulmonary effort is normal.  Abdominal:     Palpations: Abdomen is soft.  Musculoskeletal:     Cervical back: Neck supple.  Skin:    General: Skin is warm.     Capillary Refill: Capillary refill takes less than 2 seconds.  Neurological:     Mental Status: She is alert. Mental status is at baseline.  Psychiatric:        Behavior: Behavior normal.        Thought Content: Thought content normal.        Judgment: Judgment normal.     Ortho Exam Examination of the right elbow shows no swelling or fluctuance.  She is very tender to the olecranon and the medial epicondyle.  Triceps and biceps function intact.  Elbow range of motion is normal and well-tolerated. Specialty Comments:  No specialty comments available.  Imaging: No results found.   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/13/2021   Cough 04/26/2021   Acute blood loss anemia    Controlled type 2 diabetes mellitus with hyperglycemia (HCC)    Essential hypertension    Anoxic brain injury ([place]) 01/02/2021   Fever 12/20/2020   Abdominal wall cellulitis 12/20/2020   Dysphagia    Goals of care, counseling/discussion    Acute systolic heart failure (HCC)    Encephalopathy acute    Type 1 diabetes mellitus without complication ([place]) 21/97/5883   Hypertension 12/02/2020   GERD (gastroesophageal reflux disease) 12/02/2020   Anemia 12/02/2020   Cardiac arrest ([place]) 12/01/2020   Unilateral primary osteoarthritis, left knee 12/01/2018   Lateral meniscus, posterior horn derangement, left 05/14/2018   Status post arthroscopy of left knee 05/14/2018   Chronic  low back pain with sciatica 12/19/2017   Anemia 04/23/2017   Acute bronchitis 04/23/2017   Chronic back pain 10/03/2015   Symptomatic anemia 04/10/2014   Bilateral edema of lower extremity    Low back pain with left-sided sciatica    Past Medical History:  Diagnosis Date   Anemia 09/2015   Anemia    Arthritis    "knees" (04/23/2017)   Asthma    "teens; went away; came back" (04/23/2017)   B12 deficiency anemia 04/23/2017   Chronic bronchitis (HCC)    Chronic low back pain with sciatica    Chronic lower back pain    Diabetes mellitus without complication (HCC)    Diabetes type 2, controlled (HCC)    Elevated ferritin level    Fatty liver    GERD (gastroesophageal reflux disease)    GERD with stricture    Headache    "1-2/wk" (04/23/2017)   History of blood transfusion "plenty"   "related to anemia" (04/23/2017)   Hypertension    Inguinal hernia    Low back pain    Migraine    "1-2/month" (04/23/2017)   OA (osteoarthritis) of knee--left    Symptomatic anemia 04/23/2017   Vitamin B 12 deficiency     Family History  Problem Relation Age of Onset   Stroke Mother    Diabetes Mother    Diabetes Father    Stroke Maternal Grandmother    Diabetes Maternal Grandmother    Anemia Paternal Grandmother    Valvular heart disease Paternal Grandmother    Hypertension Mother    Prostate cancer Maternal Uncle        ? intestinal also    Past Surgical History:  Procedure Laterality Date   ABDOMINAL HYSTERECTOMY  YRS AGO   COMPLETE   CESAREAN SECTION  1998; 2002   ESOPHAGEAL DILATION  02/2020   by Dr Patrcia Dolly HERNIA REPAIR Bilateral 1980s    "total of 4 surgeries" (04/23/2017)   IR GASTROSTOMY TUBE MOD SED  12/13/2020   KNEE ARTHROSCOPY WITH MEDIAL MENISECTOMY Left 01/30/2018   Procedure: LEFT KNEE ARTHROSCOPY WITH PARTIAL LATERAL MENISCECTOMY;  Surgeon: Mcarthur Rossetti, MD;  Location: [facility];  Service: Orthopedics;  Laterality: Left;   KNEE CARTILAGE SURGERY Left     SUBQ ICD IMPLANT N/A 09/13/2021   Procedure: SUBQ ICD IMPLANT;  Surgeon: Constance Haw, MD;  Location: [facility];  Service: Cardiovascular;  Laterality: N/A;   TUBAL LIGATION  2002   Social History   Occupational History   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

## 2022-04-13 ENCOUNTER — Other Ambulatory Visit: Payer: Self-pay | Admitting: Physical Medicine & Rehabilitation

## 2022-04-13 DIAGNOSIS — F321 Major depressive disorder, single episode, moderate: Secondary | ICD-10-CM

## 2022-05-01 ENCOUNTER — Ambulatory Visit: Payer: Medicaid Other | Admitting: Physical Medicine and Rehabilitation

## 2022-05-01 DIAGNOSIS — R202 Paresthesia of skin: Secondary | ICD-10-CM

## 2022-05-01 NOTE — Progress Notes (Signed)
Functional Pain Scale - descriptive words and definitions  Unmanageable (7)  Pain interferes with normal ADL's/nothing seems to help/sleep is very difficult/active distractions are very difficult to concentrate on. Severe range order  Average Pain 7 Both wrists. Loses grip and has numbness in both hands.

## 2022-05-03 ENCOUNTER — Other Ambulatory Visit: Payer: Self-pay | Admitting: Orthopedic Surgery

## 2022-05-03 DIAGNOSIS — F5105 Insomnia due to other mental disorder: Secondary | ICD-10-CM

## 2022-05-09 ENCOUNTER — Encounter: Payer: Self-pay | Admitting: Physical Medicine & Rehabilitation

## 2022-05-09 ENCOUNTER — Encounter: Payer: Medicaid Other | Attending: Physical Medicine & Rehabilitation | Admitting: Physical Medicine & Rehabilitation

## 2022-05-09 VITALS — BP 112/76 | HR 68 | Ht 65.5 in | Wt 258.0 lb

## 2022-05-09 DIAGNOSIS — G931 Anoxic brain damage, not elsewhere classified: Secondary | ICD-10-CM | POA: Diagnosis present

## 2022-05-09 MED ORDER — RIVASTIGMINE TARTRATE 3 MG PO CAPS: 3.0000 mg | ORAL_CAPSULE | Freq: Two times a day (BID) | ORAL | 2 refills | Status: DC

## 2022-05-09 NOTE — Progress Notes (Signed)
Subjective:    Patient ID: Courtney Davies, female    DOB: Sep 11, 1975, 47 y.o.   MRN: [id number]  HPI  Courtney Davies is here in follow up of her anoxic brain injury. She has stayed busy reading. She goes to [employer] frequently. She still struggles with day to day memory. She makes notes but sometimes forgets where she places them. She has a phone but is not always using it for note taking.  She drives but is using GPS which works well for her when she's not familiar with a area.   For exercise she walks. She just joined [employer]. She wants to lose weight and be in better health.   She remains on trazodone, vistaril, and seroquel for sleep. She feels that her mood is much more calm and settled. "I'm in a better place"   Pain Inventory Average Pain 0 Pain Right Now 0 My pain is  no pain  LOCATION OF PAIN  no pain  BOWEL Number of stools per week: 7 Oral laxative use No  Type of laxative . Enema or suppository use No  History of colostomy No  Incontinent No   BLADDER Normal In and out cath, frequency . Able to self cath  . Bladder incontinence No  Frequent urination No  Leakage with coughing No  Difficulty starting stream No  Incomplete bladder emptying No    Mobility walk without assistance how many minutes can you walk? unlimited ability to climb steps?  yes do you drive?  yes  Function disabled: date disabled . I need assistance with the following:  household duties  Neuro/Psych weakness numbness tremor tingling confusion  Prior Studies nerve study  Physicians involved in your care Any changes since last visit?  no   Family History  Problem Relation Age of Onset   Stroke Mother    Diabetes Mother    Diabetes Father    Stroke Maternal Grandmother    Diabetes Maternal Grandmother    Anemia Paternal Grandmother    Valvular heart disease Paternal Grandmother    Hypertension Mother    Prostate cancer Maternal Uncle        ? intestinal also    Social History   Socioeconomic History   Marital status: Single    Spouse name: Not on file   Number of children: 2   Years of education: 11th   Highest education level: Not on file  Occupational History   Occupation: salad maker in restaurant  Tobacco Use   Smoking status: Former    Types: Cigarettes   Smokeless tobacco: Never  Vaping Use   Vaping Use: Never used  Substance and Sexual Activity   Alcohol use: Yes    Comment: occ   Drug use: Yes    Types: Marijuana    Comment: sometimes   Sexual activity: Not Currently    Birth control/protection: Surgical  Other Topics Concern   Not on file  Social History Narrative   ** Merged History Encounter **       Lives at home with her two children. Occasional caffeine use. Right-handed.   Social Determinants of Health   Financial Resource Strain: Not on file  Food Insecurity: Not on file  Transportation Needs: Not on file  Physical Activity: Not on file  Stress: Not on file  Social Connections: Not on file   Past Surgical History:  Procedure Laterality Date   ABDOMINAL HYSTERECTOMY  YRS [place]  1998; 2002   ESOPHAGEAL DILATION  02/2020   by Dr Shana Chute   INGUINAL HERNIA REPAIR Bilateral 1980s    "total of 4 surgeries" (04/23/2017)   IR GASTROSTOMY TUBE MOD SED  12/13/2020   KNEE ARTHROSCOPY WITH MEDIAL MENISECTOMY Left 01/30/2018   Procedure: LEFT KNEE ARTHROSCOPY WITH PARTIAL LATERAL MENISCECTOMY;  Surgeon: Mcarthur Rossetti, MD;  Location: [facility];  Service: Orthopedics;  Laterality: Left;   KNEE CARTILAGE SURGERY Left    SUBQ ICD IMPLANT N/A 09/13/2021   Procedure: SUBQ ICD IMPLANT;  Surgeon: Constance Haw, MD;  Location: [place] [facility];  Service: Cardiovascular;  Laterality: N/A;   TUBAL LIGATION  2002   Past Medical History:  Diagnosis Date   Anemia 09/2015   Anemia    Arthritis    "knees" (04/23/2017)   Asthma    "teens; went away; came back" (04/23/2017)   B12  deficiency anemia 04/23/2017   Chronic bronchitis (HCC)    Chronic low back pain with sciatica    Chronic lower back pain    Diabetes mellitus without complication ([place])    Diabetes type 2, controlled ([place])    Elevated ferritin level    Fatty liver    GERD (gastroesophageal reflux disease)    GERD with stricture    Headache    "1-2/wk" (04/23/2017)   History of blood transfusion "plenty"   "related to anemia" (04/23/2017)   Hypertension    Inguinal hernia    Low back pain    Migraine    "1-2/month" (04/23/2017)   OA (osteoarthritis) of knee--left    Symptomatic anemia 04/23/2017   Vitamin B 12 deficiency    BP 112/76   Pulse 68   Ht 5' 5.5" (1.664 m)   Wt 258 lb (117 kg)   LMP 12/03/2012   SpO2 96%   BMI 42.28 kg/m   Opioid Risk Score:   Fall Risk Score:  `1  Depression screen [facility] 2/9     03/01/2022   10:42 AM 11/08/2021    2:32 PM 05/03/2021   11:29 AM 03/08/2021   11:28 AM  Depression screen PHQ 2/9  Decreased Interest '2 2 1 '$ 0  Down, Depressed, Hopeless '3 2 1 1  '$ PHQ - 2 Score '5 4 2 1  '$ Altered sleeping 3   1  Tired, decreased energy 3   0  Change in appetite 1   0  Feeling bad or failure about yourself  3   2  Trouble concentrating 3   0  Moving slowly or fidgety/restless 1   0  Suicidal thoughts 3 3  0  PHQ-9 Score 22   4  Difficult doing work/chores Extremely dIfficult   Not difficult at all      Review of Systems  Neurological:  Positive for tremors, weakness and numbness.  Psychiatric/Behavioral:  Positive for confusion.   All other systems reviewed and are negative.     Objective:   Physical Exam  General: No acute distress HEENT: NCAT, EOMI, oral membranes moist Cards: reg rate  Chest: normal effort Abdomen: Soft, NT, ND Skin: dry, intact Extremities: no edema   Psych: pleasant, MOOD is calm.  Skin: intact Neuro: Patient is alert and oriented with extra time.  Cranial nerve exam is unremarkable.   .  less distracte,short term memory  deficits. Improved insight. Iproved awareness. Motor 5/5. Sensory normal.  Musculoskeletal: less antalgia RLE   Assessment & Plan:  1.  Decline in ADL, mobility , and cognition secondary  to anoxic brain injury - improved awareness. Still has signiifcant stm deficits.  -I'm ok with her driving -trial of exelon '3mg'$  bid 2. Left knee pain: happy that she's exercising now             voltaren gel prn.  4. Mood:               -continue serqouel hs for sleep and mood lability -continue zoloft for mood. She's in a better place -made referral to behavioral health psychology in [place], pt appreciative.  -encouraged ongoing engagement of church and friends as well.   5. T2DM per primary?: 6. HTN: 11. SVT/NSVT: On coreg and amiodarone bid for rate control.               -s/p ICD/pacer

## 2022-05-09 NOTE — Progress Notes (Signed)
Courtney Davies - 47 y.o. female MRN [id number]  Date of birth: 1975/12/18  Office Visit Note: Visit Date: 05/01/2022 PCP: Yvonna Alanis, NP Referred by: Leandrew Koyanagi, MD  Subjective: Chief Complaint  Patient presents with   Right Wrist - Pain   Left Wrist - Pain   HPI:  Courtney Davies is a 47 y.o. female who comes in today at the request of Dr. Eduard Roux for evaluation and management of chronic, worsening and severe pain, numbness and tingling in the Right upper extremities.  Patient is Bilateral hand dominant.  She reports chronic worsening right elbow pain with swelling but also tingling numbness in all 10 digits in a nondermatomal fashion.  Her case is complicated by diabetes with last hemoglobin A1c of 7.8.  No prior electrodiagnostic studies.   ROS Otherwise per HPI.  Assessment & Plan: Visit Diagnoses:    ICD-10-CM   1. Paresthesia of skin  R20.2 NCV with EMG (electromyography)      Plan: Impression: The above electrodiagnostic study is ABNORMAL and reveals evidence of a mild bilateral median nerve entrapment at the wrist (carpal tunnel syndrome) affecting sensory components.   There is no significant electrodiagnostic evidence of any other focal nerve entrapment, brachial plexopathy or cervical radiculopathy.  As you know, this particular electrodiagnostic study cannot rule out chemical radiculitis or sensory only radiculopathy.   **This electrodiagnostic study cannot rule out small fiber polyneuropathy and dysesthesias from central pain syndromes such as stroke or central pain sensitization syndromes such as fibromyalgia.  Myotomal referral pain from trigger points is also not excluded.  Recommendations: 1.  Follow-up with referring physician. 2.  Continue current management of symptoms. 3.  Continue use of resting splint at night-time and as needed during the day.  Meds & Orders: No orders of the defined types were placed in this encounter.   Orders Placed This  Encounter  Procedures   NCV with EMG (electromyography)    Follow-up: Return in about 2 weeks (around 05/15/2022) for  Eduard Roux, MD.   Procedures: No procedures performed  EMG & NCV Findings: Evaluation of the left median (across palm) sensory nerve showed prolonged distal peak latency (Wrist, 3.7 ms).  The right median (across palm) sensory nerve showed no response (Palm) and prolonged distal peak latency (3.8 ms).  All remaining nerves (as indicated in the following tables) were within normal limits.  All left vs. right side differences were within normal limits.    Impression: The above electrodiagnostic study is ABNORMAL and reveals evidence of a mild bilateral median nerve entrapment at the wrist (carpal tunnel syndrome) affecting sensory components.   There is no significant electrodiagnostic evidence of any other focal nerve entrapment, brachial plexopathy or cervical radiculopathy.  As you know, this particular electrodiagnostic study cannot rule out chemical radiculitis or sensory only radiculopathy.   **This electrodiagnostic study cannot rule out small fiber polyneuropathy and dysesthesias from central pain syndromes such as stroke or central pain sensitization syndromes such as fibromyalgia.  Myotomal referral pain from trigger points is also not excluded.  Recommendations: 1.  Follow-up with referring physician. 2.  Continue current management of symptoms. 3.  Continue use of resting splint at night-time and as needed during the day.  ___________________________ Wonda Olds Board Certified, American Board of Physical Medicine and Rehabilitation    Nerve Conduction Studies Anti Sensory Summary Table   Stim Site NR Peak (ms) Norm Peak (ms) P-T Amp (V) Norm P-T Amp Site1 Site2 Delta-P (  ms) Dist (cm) Vel (m/s) Norm Vel (m/s)  Left Median Acr Palm Anti Sensory (2nd Digit)  31.2C  Wrist    *3.7 <3.6 15.1 >10 Wrist Palm 1.9 0.0    Palm    1.8 <2.0 22.2          Right Median Acr Palm Anti Sensory (2nd Digit)  30.5C  Wrist    *3.8 <3.6 26.3 >10 Wrist Palm  0.0    Palm *NR  <2.0          Right Radial Anti Sensory (Base 1st Digit)  30.9C  Wrist    2.2 <3.1 30.6  Wrist Base 1st Digit 2.2 0.0    Right Ulnar Anti Sensory (5th Digit)  31.2C  Wrist    3.1 <3.7 22.8 >15.0 Wrist 5th Digit 3.1 14.0 45 >38   Motor Summary Table   Stim Site NR Onset (ms) Norm Onset (ms) O-P Amp (mV) Norm O-P Amp Site1 Site2 Delta-0 (ms) Dist (cm) Vel (m/s) Norm Vel (m/s)  Left Median Motor (Abd Poll Brev)  31.5C  Wrist    3.4 <4.2 11.2 >5 Elbow Wrist 4.3 23.0 53 >50  Elbow    7.7  10.3         Right Median Motor (Abd Poll Brev)  31.1C  Wrist    3.2 <4.2 11.2 >5 Elbow Wrist 4.2 21.5 51 >50  Elbow    7.4  9.8         Right Ulnar Motor (Abd Dig Min)  31.2C  Wrist    3.0 <4.2 10.7 >3 B Elbow Wrist 3.8 22.0 58 >53  B Elbow    6.8  9.1  A Elbow B Elbow 1.2 11.0 92 >53  A Elbow    8.0  10.3           Nerve Conduction Studies Anti Sensory Left/Right Comparison   Stim Site L Lat (ms) R Lat (ms) L-R Lat (ms) L Amp (V) R Amp (V) L-R Amp (%) Site1 Site2 L Vel (m/s) R Vel (m/s) L-R Vel (m/s)  Median Acr Palm Anti Sensory (2nd Digit)  31.2C  Wrist *3.7 *3.8 0.1 15.1 26.3 42.6 Wrist Palm     Palm 1.8   22.2         Radial Anti Sensory (Base 1st Digit)  30.9C  Wrist  2.2   30.6  Wrist Base 1st Digit     Ulnar Anti Sensory (5th Digit)  31.2C  Wrist  3.1   22.8  Wrist 5th Digit  45    Motor Left/Right Comparison   Stim Site L Lat (ms) R Lat (ms) L-R Lat (ms) L Amp (mV) R Amp (mV) L-R Amp (%) Site1 Site2 L Vel (m/s) R Vel (m/s) L-R Vel (m/s)  Median Motor (Abd Poll Brev)  31.5C  Wrist 3.4 3.2 0.2 11.2 11.2 0.0 Elbow Wrist 53 51 2  Elbow 7.7 7.4 0.3 10.3 9.8 4.9       Ulnar Motor (Abd Dig Min)  31.2C  Wrist  3.0   10.7  B Elbow Wrist  58   B Elbow  6.8   9.1  A Elbow B Elbow  92   A Elbow  8.0   10.3           Waveforms:                 Clinical  History: No specialty comments available.     Objective:  VS:  HT:    WT:  BMI:     BP:   HR: bpm  TEMP: ( )  RESP:  Physical Exam Musculoskeletal:        General: No swelling, tenderness or deformity.     Comments: Inspection reveals no atrophy of the bilateral APB or FDI or hand intrinsics. There is no swelling, color changes, allodynia or dystrophic changes. There is 5 out of 5 strength in the bilateral wrist extension, finger abduction and long finger flexion. There is intact sensation to light touch in all dermatomal and peripheral nerve distributions.  There is a negative Tinel's test at the bilateral wrist and elbow. There is a negative Phalen's test bilaterally. There is a negative Hoffmann's test bilaterally.  Skin:    General: Skin is warm and dry.     Findings: No erythema or rash.  Neurological:     General: No focal deficit present.     Mental Status: She is alert and oriented to person, place, and time.     Motor: No weakness or abnormal muscle tone.     Coordination: Coordination normal.  Psychiatric:        Mood and Affect: Mood normal.        Behavior: Behavior normal.      Imaging: No results found.

## 2022-05-09 NOTE — Patient Instructions (Signed)
KEEP USING YOUR "NOTEPAD" ON YOUR PHONE. IF YOU MAKE IT A ROUTINE, YOU WON'T FORGET!!!   ALWAYS FEEL FREE TO CALL OUR OFFICE WITH ANY PROBLEMS OR QUESTIONS ([phone])  **PLEASE NOTE** ALL MEDICATION REFILL REQUESTS (INCLUDING CONTROLLED SUBSTANCES) NEED TO BE MADE AT LEAST 7 DAYS PRIOR TO REFILL BEING DUE. ANY REFILL REQUESTS INSIDE THAT TIME FRAME MAY RESULT IN DELAYS IN RECEIVING YOUR PRESCRIPTION.

## 2022-05-09 NOTE — Procedures (Signed)
EMG & NCV Findings: Evaluation of the left median (across palm) sensory nerve showed prolonged distal peak latency (Wrist, 3.7 ms).  The right median (across palm) sensory nerve showed no response (Palm) and prolonged distal peak latency (3.8 ms).  All remaining nerves (as indicated in the following tables) were within normal limits.  All left vs. right side differences were within normal limits.    Impression: The above electrodiagnostic study is ABNORMAL and reveals evidence of a mild bilateral median nerve entrapment at the wrist (carpal tunnel syndrome) affecting sensory components.   There is no significant electrodiagnostic evidence of any other focal nerve entrapment, brachial plexopathy or cervical radiculopathy.  As you know, this particular electrodiagnostic study cannot rule out chemical radiculitis or sensory only radiculopathy.   **This electrodiagnostic study cannot rule out small fiber polyneuropathy and dysesthesias from central pain syndromes such as stroke or central pain sensitization syndromes such as fibromyalgia.  Myotomal referral pain from trigger points is also not excluded.  Recommendations: 1.  Follow-up with referring physician. 2.  Continue current management of symptoms. 3.  Continue use of resting splint at night-time and as needed during the day.  ___________________________ Wonda Olds Board Certified, American Board of Physical Medicine and Rehabilitation    Nerve Conduction Studies Anti Sensory Summary Table   Stim Site NR Peak (ms) Norm Peak (ms) P-T Amp (V) Norm P-T Amp Site1 Site2 Delta-P (ms) Dist (cm) Vel (m/s) Norm Vel (m/s)  Left Median Acr Palm Anti Sensory (2nd Digit)  31.2C  Wrist    *3.7 <3.6 15.1 >10 Wrist Palm 1.9 0.0    Palm    1.8 <2.0 22.2         Right Median Acr Palm Anti Sensory (2nd Digit)  30.5C  Wrist    *3.8 <3.6 26.3 >10 Wrist Palm  0.0    Palm *NR  <2.0          Right Radial Anti Sensory (Base 1st Digit)  30.9C   Wrist    2.2 <3.1 30.6  Wrist Base 1st Digit 2.2 0.0    Right Ulnar Anti Sensory (5th Digit)  31.2C  Wrist    3.1 <3.7 22.8 >15.0 Wrist 5th Digit 3.1 14.0 45 >38   Motor Summary Table   Stim Site NR Onset (ms) Norm Onset (ms) O-P Amp (mV) Norm O-P Amp Site1 Site2 Delta-0 (ms) Dist (cm) Vel (m/s) Norm Vel (m/s)  Left Median Motor (Abd Poll Brev)  31.5C  Wrist    3.4 <4.2 11.2 >5 Elbow Wrist 4.3 23.0 53 >50  Elbow    7.7  10.3         Right Median Motor (Abd Poll Brev)  31.1C  Wrist    3.2 <4.2 11.2 >5 Elbow Wrist 4.2 21.5 51 >50  Elbow    7.4  9.8         Right Ulnar Motor (Abd Dig Min)  31.2C  Wrist    3.0 <4.2 10.7 >3 B Elbow Wrist 3.8 22.0 58 >53  B Elbow    6.8  9.1  A Elbow B Elbow 1.2 11.0 92 >53  A Elbow    8.0  10.3           Nerve Conduction Studies Anti Sensory Left/Right Comparison   Stim Site L Lat (ms) R Lat (ms) L-R Lat (ms) L Amp (V) R Amp (V) L-R Amp (%) Site1 Site2 L Vel (m/s) R Vel (m/s) L-R Vel (m/s)  Median Acr Palm Anti  Sensory (2nd Digit)  31.2C  Wrist *3.7 *3.8 0.1 15.1 26.3 42.6 Wrist Palm     Palm 1.8   22.2         Radial Anti Sensory (Base 1st Digit)  30.9C  Wrist  2.2   30.6  Wrist Base 1st Digit     Ulnar Anti Sensory (5th Digit)  31.2C  Wrist  3.1   22.8  Wrist 5th Digit  45    Motor Left/Right Comparison   Stim Site L Lat (ms) R Lat (ms) L-R Lat (ms) L Amp (mV) R Amp (mV) L-R Amp (%) Site1 Site2 L Vel (m/s) R Vel (m/s) L-R Vel (m/s)  Median Motor (Abd Poll Brev)  31.5C  Wrist 3.4 3.2 0.2 11.2 11.2 0.0 Elbow Wrist 53 51 2  Elbow 7.7 7.4 0.3 10.3 9.8 4.9       Ulnar Motor (Abd Dig Min)  31.2C  Wrist  3.0   10.7  B Elbow Wrist  58   B Elbow  6.8   9.1  A Elbow B Elbow  92   A Elbow  8.0   10.3           Waveforms:

## 2022-05-15 ENCOUNTER — Encounter: Payer: Self-pay | Admitting: Orthopaedic Surgery

## 2022-05-15 ENCOUNTER — Ambulatory Visit: Payer: Medicaid Other | Admitting: Orthopaedic Surgery

## 2022-05-15 DIAGNOSIS — G5601 Carpal tunnel syndrome, right upper limb: Secondary | ICD-10-CM

## 2022-05-15 DIAGNOSIS — M25521 Pain in right elbow: Secondary | ICD-10-CM | POA: Diagnosis not present

## 2022-05-15 NOTE — Progress Notes (Signed)
Office Visit Note   Patient: Courtney Davies           Date of Birth: 1975-10-03           MRN: [id number] Visit Date: 05/15/2022              Requested by: Yvonna Alanis, NP [phone],  [place] [id number] PCP: Yvonna Alanis, NP   Assessment & Plan: Visit Diagnoses:  1. Pain in right elbow   2. Carpal tunnel syndrome, right upper limb     Plan: Impression is chronic right elbow and wrist pain with underlying mild carpal tunnel syndrome.  It's somewhat hard to distinguish what is causing her elbow pain.  We have discussed putting her in a Velcro wrist splints to try and calm her carpal tunnel down to see if this will help her elbow.  She will follow-up with [place] if symptoms persist at that point we may refer her to neurology.  Follow-Up Instructions: Return if symptoms worsen or fail to improve.   Orders:  No orders of the defined types were placed in this encounter.  No orders of the defined types were placed in this encounter.     Procedures: No procedures performed   Clinical Data: No additional findings.   Subjective: Chief Complaint  Patient presents with   Right Elbow - Pain    HPI patient is a pleasant 47 year old female who comes in today with continued right elbow pain.  She was seen in our office few months ago for this with uncertain etiology.  Pain she has been having this to the entire elbow rating down the forearm and has been ongoing for the past several months.  Symptoms are worse with any range of motion of the elbow.  She she was started on a course of gabapentin and steroids which did not improve her symptoms.  She she does have paresthesias to all 10 fingers.  She was referred to Dr. Ernestina Patches for nerve conduction study which showed mild bilateral carpal tunnel syndrome.  Review of Systems as detailed in HPI.  All others reviewed and are negative.   Objective: Vital Signs: LMP 12/03/2012   Physical Exam well-developed and well-nourished  female in no acute distress.  Alert and oriented x 3.  Ortho Exam right elbow shows diffuse tenderness.  Pain with elbow flexion and extension.  No pain with supination.  She has a positive Phalen and Tinel at the wrist.  Specialty Comments:  No specialty comments available.  Imaging: No new imaging   PMFS History: Patient Active Problem List   Diagnosis Date Noted   Mild intermittent asthma without complication 19/37/9024   Vitamin B12 deficiency 02/09/2022   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   Cough 04/26/2021   Acute blood loss anemia    Controlled type 2 diabetes mellitus with hyperglycemia ([place])    Essential hypertension    Anoxic brain injury ([place]) 01/02/2021   Fever 12/20/2020   Abdominal wall cellulitis 12/20/2020   Dysphagia    Goals of care, counseling/discussion    Acute systolic heart failure ([place])    Encephalopathy acute    Type 1 diabetes mellitus without complication ([place]) 09/73/5329   Hypertension 12/02/2020   GERD (gastroesophageal reflux disease) 12/02/2020   Anemia 12/02/2020   Cardiac arrest ([place]) 12/01/2020   Unilateral primary osteoarthritis, left knee 12/01/2018   Lateral meniscus, posterior horn derangement, left 05/14/2018   Status post arthroscopy of  left knee 05/14/2018   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/19/2017   Anemia 04/23/2017   Acute bronchitis 04/23/2017   Chronic back pain 10/03/2015   Symptomatic anemia 04/10/2014   Bilateral edema of lower extremity    Low back pain with left-sided sciatica    Past Medical History:  Diagnosis Date   Anemia 09/2015   Anemia    Arthritis    "knees" (04/23/2017)   Asthma    "teens; went away; came back" (04/23/2017)   B12 deficiency anemia 04/23/2017   Chronic bronchitis ([facility]CC)    Chronic low back pain with sciatica    Chronic lower back pain    Diabetes mellitus without complication ([facility])    Diabetes type 2, controlled ([facility])     Elevated ferritin level    Fatty liver    GERD (gastroesophageal reflux disease)    GERD with stricture    Headache    "1-2/wk" (04/23/2017)   History of blood transfusion "plenty"   "related to anemia" (04/23/2017)   Hypertension    Inguinal hernia    Low back pain    Migraine    "1-2/month" (04/23/2017)   OA (osteoarthritis) of knee--left    Symptomatic anemia 04/23/2017   Vitamin B 12 deficiency     Family History  Problem Relation Age of Onset   Stroke Mother    Diabetes Mother    Diabetes Father    Stroke Maternal Grandmother    Diabetes Maternal Grandmother    Anemia Paternal Grandmother    Valvular heart disease Paternal Grandmother    Hypertension Mother    Prostate cancer Maternal Uncle        ? intestinal also    Past Surgical History:  Procedure Laterality Date   ABDOMINAL HYSTERECTOMY  YRS AGO   COMPLETE   CESAREAN SECTION  1998; 2002   ESOPHAGEAL DILATION  02/2020   by Dr Patrcia Dolly HERNIA REPAIR Bilateral 1980s    "total of 4 surgeries" (04/23/2017)   IR GASTROSTOMY TUBE MOD SED  12/13/2020   KNEE ARTHROSCOPY WITH MEDIAL MENISECTOMY Left 01/30/2018   Procedure: LEFT KNEE ARTHROSCOPY WITH PARTIAL LATERAL MENISCECTOMY;  Surgeon: Mcarthur Rossetti, MD;  Location: [facility];  Service: Orthopedics;  Laterality: Left;   KNEE CARTILAGE SURGERY Left    SUBQ ICD IMPLANT N/A 09/13/2021   Procedure: SUBQ ICD IMPLANT;  Surgeon: Constance Haw, MD;  Location: [facility];  Service: Cardiovascular;  Laterality: N/A;   TUBAL LIGATION  2002   Social History   Occupational History   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

## 2022-05-31 ENCOUNTER — Ambulatory Visit: Payer: Medicaid Other | Admitting: Orthopedic Surgery

## 2022-05-31 ENCOUNTER — Telehealth: Payer: Self-pay

## 2022-05-31 NOTE — Telephone Encounter (Signed)
LMOVM letting patient know that I spoke with Joey and everything looks good. She can continue to do things like she normally do. He states that she is scheduled for a transmission on 06/14/2022. Lets watch and see if the monitor flash then.

## 2022-05-31 NOTE — Telephone Encounter (Signed)
The patient called to get help with her monitor because it was flashing. We called tech support but the lady was confusing. I told the patient that I would ask Joey and give her a call back.

## 2022-06-06 ENCOUNTER — Other Ambulatory Visit: Payer: Self-pay | Admitting: Orthopedic Surgery

## 2022-06-06 DIAGNOSIS — F5105 Insomnia due to other mental disorder: Secondary | ICD-10-CM

## 2022-06-06 NOTE — Telephone Encounter (Signed)
This medication was prescribed "as needed" basis. She has been taking very night. I will not prescribe anymore at this time.

## 2022-06-06 NOTE — Telephone Encounter (Signed)
Patient has request refill on medication Hydroxyzine 99m. Patient was only given 30 capsules. Please approve additional refills.

## 2022-06-07 ENCOUNTER — Ambulatory Visit (INDEPENDENT_AMBULATORY_CARE_PROVIDER_SITE_OTHER): Payer: Medicaid Other | Admitting: Orthopedic Surgery

## 2022-06-07 ENCOUNTER — Encounter: Payer: Self-pay | Admitting: Orthopedic Surgery

## 2022-06-07 VITALS — BP 108/62 | HR 72 | Temp 97.2°F | Resp 17 | Ht 65.5 in | Wt 252.8 lb

## 2022-06-07 DIAGNOSIS — Z794 Long term (current) use of insulin: Secondary | ICD-10-CM

## 2022-06-07 DIAGNOSIS — F5105 Insomnia due to other mental disorder: Secondary | ICD-10-CM

## 2022-06-07 DIAGNOSIS — J4520 Mild intermittent asthma, uncomplicated: Secondary | ICD-10-CM

## 2022-06-07 DIAGNOSIS — E1165 Type 2 diabetes mellitus with hyperglycemia: Secondary | ICD-10-CM

## 2022-06-07 DIAGNOSIS — Z6841 Body Mass Index (BMI) 40.0 and over, adult: Secondary | ICD-10-CM

## 2022-06-07 DIAGNOSIS — I10 Essential (primary) hypertension: Secondary | ICD-10-CM | POA: Diagnosis not present

## 2022-06-07 DIAGNOSIS — F99 Mental disorder, not otherwise specified: Secondary | ICD-10-CM

## 2022-06-07 DIAGNOSIS — I480 Paroxysmal atrial fibrillation: Secondary | ICD-10-CM | POA: Diagnosis not present

## 2022-06-07 DIAGNOSIS — G931 Anoxic brain damage, not elsewhere classified: Secondary | ICD-10-CM

## 2022-06-07 DIAGNOSIS — Z95810 Presence of automatic (implantable) cardiac defibrillator: Secondary | ICD-10-CM

## 2022-06-07 DIAGNOSIS — F329 Major depressive disorder, single episode, unspecified: Secondary | ICD-10-CM

## 2022-06-07 MED ORDER — HYDROXYZINE PAMOATE 25 MG PO CAPS: 25.0000 mg | ORAL_CAPSULE | Freq: Every evening | ORAL | 5 refills | Status: DC | PRN

## 2022-06-07 NOTE — Progress Notes (Signed)
Careteam: Patient Care Team: Yvonna Alanis, NP as PCP - General (Adult Health Nurse Practitioner) Jettie Booze, MD as PCP - Cardiology (Cardiology) Constance Haw, MD as PCP - Electrophysiology (Cardiology) [place], [place], PA-C [facility])  Seen by: Windell Moulding, AGNP-C  PLACE OF SERVICE:  [place]  Advanced Directive information    Allergies  Allergen Reactions   Latex Hives   Other Other (See Comments), Anaphylaxis, Swelling and Hives    Hair glue causes throat to close and hives "glue" Hair glue causes throat to close    Codeine Nausea And Vomiting    Was on an empty stomach.   Nitrofurantoin Rash    Chief Complaint  Patient presents with   Medical Management of Chronic Issues    3 month follow up.    Health Maintenance    Discuss the need for Eye exam, and Pap smear.    Immunizations    Discuss the need for [employer].     HPI: Patient is a 47 y.o. female seen today for medical management of chronic conditions.   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 Applied for [employer] program and was denied. Followed by Dr. Tessa Lerner. She is able to drive with cell phone navigation. She is able to ambulate on her own and complete ADLs with minimal assistance.   Insomnia improved with hydroxyzine. She is unsure if she is taking Trazodone, but did not think it helped. She believes improved sleep has also helped memory issues associated with anoxic brain injury,.   Followed by endocrinology for T2DM. A1c was 7.8 (02/2022). She is now taking metformin, Jardiance, Trulicity and SSI prn. Diabetic eye exam 05/23/2022. She also got freestyle libre approved. No recent hypoglycemias. Asking to  have A1c rechecked today.    Review of Systems:  Review of Systems  Constitutional:  Negative for chills and fever.  HENT:  Negative for congestion and sore throat.   Eyes:  Negative for blurred vision and double vision.  Respiratory:  Negative for cough, shortness of breath and wheezing.   Cardiovascular:  Negative for chest pain, orthopnea and leg swelling.  Gastrointestinal:  Positive for heartburn. Negative for abdominal pain.  Genitourinary:  Negative for dysuria.  Musculoskeletal:  Positive for joint pain. Negative for falls.  Skin:  Negative for rash.  Neurological:  Positive for sensory change. Negative for dizziness, weakness and headaches.  Endo/Heme/Allergies:  Negative for polydipsia.  Psychiatric/Behavioral:  Positive for depression and memory loss. The patient is nervous/anxious and has insomnia.     Past Medical History:  Diagnosis Date   Anemia 09/2015   Anemia    Arthritis    "knees" (04/23/2017)   Asthma    "teens; went away; came back" (04/23/2017)   B12 deficiency anemia 04/23/2017   Chronic bronchitis (HCC)    Chronic low back pain with sciatica    Chronic lower back pain    Diabetes mellitus without complication ([place])    Diabetes type 2, controlled ([place])    Elevated ferritin level    Fatty liver    GERD (gastroesophageal reflux disease)    GERD with stricture    Headache    "1-2/wk" (04/23/2017)   History of blood transfusion "plenty"   "related to anemia" (04/23/2017)   Hypertension  Inguinal hernia    Low back pain    Migraine    "1-2/month" (04/23/2017)   OA (osteoarthritis) of knee--left    Symptomatic anemia 04/23/2017   Vitamin B 12 deficiency    Past Surgical History:  Procedure Laterality Date   ABDOMINAL HYSTERECTOMY  YRS AGO   COMPLETE   CESAREAN SECTION  1998; 2002   ESOPHAGEAL DILATION  02/2020   by Dr Patrcia Dolly HERNIA REPAIR Bilateral 1980s    "total of 4 surgeries" (04/23/2017)   IR GASTROSTOMY TUBE MOD SED  12/13/2020    KNEE ARTHROSCOPY WITH MEDIAL MENISECTOMY Left 01/30/2018   Procedure: LEFT KNEE ARTHROSCOPY WITH PARTIAL LATERAL MENISCECTOMY;  Surgeon: Mcarthur Rossetti, MD;  Location: [facility];  Service: Orthopedics;  Laterality: Left;   KNEE CARTILAGE SURGERY Left    SUBQ ICD IMPLANT N/A 09/13/2021   Procedure: SUBQ ICD IMPLANT;  Surgeon: Constance Haw, MD;  Location: [place] [facility];  Service: Cardiovascular;  Laterality: N/A;   TUBAL LIGATION  2002   Social History:   reports that she has quit smoking. Her smoking use included cigarettes. She has never used smokeless tobacco. She reports current alcohol use. She reports current drug use. Drug: Marijuana.  Family History  Problem Relation Age of Onset   Stroke Mother    Diabetes Mother    Diabetes Father    Stroke Maternal Grandmother    Diabetes Maternal Grandmother    Anemia Paternal Grandmother    Valvular heart disease Paternal Grandmother    Hypertension Mother    Prostate cancer Maternal Uncle        ? intestinal also    Medications: Patient's Medications  New Prescriptions   No medications on file  Previous Medications   ACCU-CHEK GUIDE TEST STRIP       ALBUTEROL (VENTOLIN HFA) 108 (90 BASE) MCG/ACT INHALER    Inhale 2 puffs into the lungs every 6 (six) hours as needed for wheezing or shortness of breath.   AMIODARONE (PACERONE) 200 MG TABLET    Take 200 mg by mouth daily.   BD PEN NEEDLE NANO 2ND GEN 32G X 4 MM MISC    3 (three) times daily. as directed   BLOOD GLUCOSE MONITORING SUPPL (ACCU-CHEK GUIDE ME) W/DEVICE KIT    SMARTSIG:Via Meter   CARVEDILOL (COREG) 6.25 MG TABLET    Take 1 tablet (6.25 mg total) by mouth 2 (two) times daily with a meal.   DOXYCYCLINE (VIBRA-TABS) 100 MG TABLET    Take 100 mg by mouth 2 (two) times daily.   FLUTICASONE FUROATE-VILANTEROL (BREO ELLIPTA) 200-25 MCG/ACT AEPB    Inhale 1 puff into the lungs daily.   GABAPENTIN (NEURONTIN) 100 MG CAPSULE    Take 1-3 capsules (100-300 mg total) by  mouth at bedtime as needed.   HUMALOG KWIKPEN 100 UNIT/ML KWIKPEN    14-15 Units 3 (three) times daily before meals.   HYDROXYZINE (VISTARIL) 25 MG CAPSULE    TAKE 1 CAPSULE(25 MG) BY MOUTH AT BEDTIME AS NEEDED. INCREASE INSOMNIA/ ANXIETY   INSULIN SYRINGE-NEEDLE U-100 (INSULIN SYRINGE .5CC/31GX5/16") 31G X 5/16" 0.5 ML MISC    3 (three) times daily.   KETOCONAZOLE (NIZORAL) 2 % CREAM    Apply 1 Application topically daily.   LANTUS SOLOSTAR 100 UNIT/ML SOLOSTAR PEN    Inject 30 Units into the skin at bedtime.   LINZESS 290 MCG CAPS CAPSULE    Take 290 mcg by mouth daily as needed (Constipation).   LOSARTAN (  COZAAR) 25 MG TABLET    Take 1 tablet (25 mg total) by mouth daily.   MELOXICAM (MOBIC) 7.5 MG TABLET    Take 7.5 mg by mouth daily.   PANTOPRAZOLE (PROTONIX) 40 MG TABLET    Take 1 tablet (40 mg total) by mouth 2 (two) times daily.   PREDNISONE (STERAPRED UNI-PAK 21 TAB) 10 MG (21) TBPK TABLET    Take as directed   QUETIAPINE (SEROQUEL) 25 MG TABLET    TAKE 1 TABLET(25 MG) BY MOUTH AT BEDTIME   RIVASTIGMINE (EXELON) 3 MG CAPSULE    Take 1 capsule (3 mg total) by mouth 2 (two) times daily.   SERTRALINE (ZOLOFT) 50 MG TABLET    TAKE 1 TABLET(25 MG) BY MOUTH DAILY   SYMBICORT 160-4.5 MCG/ACT INHALER    Inhale 2 puffs into the lungs in the morning and at bedtime.   TRAZODONE (DESYREL) 50 MG TABLET    Take 100 mg by mouth at bedtime as needed for sleep.  Modified Medications   No medications on file  Discontinued Medications   No medications on file    Physical Exam:  There were no vitals filed for this visit. There is no height or weight on file to calculate BMI. Wt Readings from Last 3 Encounters:  05/09/22 258 lb (117 kg)  04/02/22 263 lb 6.4 oz (119.5 kg)  03/01/22 253 lb 6 oz (114.9 kg)    Physical Exam Vitals reviewed.  Constitutional:      Appearance: She is obese.  HENT:     Head: Normocephalic.  Eyes:     General:        Right eye: No discharge.        Left eye: No  discharge.  Cardiovascular:     Rate and Rhythm: Normal rate. Rhythm irregular.     Pulses: Normal pulses.     Heart sounds: Normal heart sounds.  Pulmonary:     Effort: Pulmonary effort is normal. No respiratory distress.     Breath sounds: Normal breath sounds. No wheezing.  Abdominal:     General: Bowel sounds are normal. There is no distension.     Palpations: Abdomen is soft.     Tenderness: There is no abdominal tenderness.  Musculoskeletal:     Cervical back: Neck supple.     Right lower leg: No edema.     Left lower leg: No edema.  Skin:    General: Skin is warm and dry.     Capillary Refill: Capillary refill takes less than 2 seconds.  Neurological:     General: No focal deficit present.     Mental Status: She is alert and oriented to person, place, and time.     Motor: No weakness.     Gait: Gait normal.  Psychiatric:        Mood and Affect: Mood normal.        Behavior: Behavior normal.     Labs reviewed: Basic Metabolic Panel: Recent Labs    06/19/21 0943 02/08/22 1104  NA 133* 136  K 3.9 4.0  CL 101 103  CO2 22 22  GLUCOSE 287* 134*  BUN 12 15  CREATININE 0.79 0.94  CALCIUM 9.2 9.2   Liver Function Tests: Recent Labs    06/19/21 0943 02/08/22 1104  AST 15 13  ALT 18 11  ALKPHOS 111  --   BILITOT 0.7 0.4  PROT 7.8 7.3  ALBUMIN 3.9  --    Recent Labs  06/19/21 0943  LIPASE 33   No results for input(s): "AMMONIA" in the last 8760 hours. CBC: Recent Labs    06/19/21 0943 02/08/22 1104  WBC 6.5 5.5  NEUTROABS  --  2,728  HGB 14.1 12.8  HCT 44.6 41.1  MCV 72.4* 73.4*  PLT 237 262   Lipid Panel: No results for input(s): "CHOL", "HDL", "LDLCALC", "TRIG", "CHOLHDL", "LDLDIRECT" in the last 8760 hours. TSH: No results for input(s): "TSH" in the last 8760 hours. A1C: Lab Results  Component Value Date   HGBA1C 9.6 (H) 02/08/2022     Assessment/Plan 1. Controlled type 2 diabetes mellitus with hyperglycemia, with long-term  current use of insulin ([place]) - followed by endocrinology - A1c 7.8 (02/2022) - diabetic eye exam 05/23/2022 - now has Freestyle Libre> no hypoglycemias - cont metformin, Jardiance, Trulicity and SSI prn - Hemoglobin A1c  2. Anoxic brain injury ([place]) - ongoing - followed by Dr. Tessa Lerner - denied by [facility] program - doing better with ADLs and memory  3. Essential hypertension - controlled - cont losartan  4. Paroxysmal A-fib (HCC) - HR< 100 on amiodarone - not on anticoagulation  5. Mild intermittent asthma without complication - no recent exacerbations - cont albuterol prn  6. ICD (implantable cardioverter-defibrillator) in place - followed by Dr. Curt Bears - s/p ICD implant 09/13/2021  7. Reactive depression - ongoing - cont Zoloft  8. Insomnia due to other mental disorder - improved with hydroxyzine - discontinue Trazodone - hydrOXYzine (VISTARIL) 25 MG capsule; Take 1 capsule (25 mg total) by mouth at bedtime as needed.  Dispense: 30 capsule; Refill: 5  9. Class 3 severe obesity due to excess calories with serious comorbidity and body mass index (BMI) of 40.0 to 44.9 in adult (HCC) - BMI 41.43 - secondary to anoxic brain injury and limited physical ability - discussed limiting calories and snacking, < 1500 daily - recommend physical activity like walking 150 minutes weekly - consider [facility] weight and wellness program if problem worsens or persists  Total time: 37 minutes. Greater than 50% of total time spent doing patient education regarding health maintenance, anoxic brain injury, T2DM, insomnia, HTN, and obesity including symptom/medication management.    Next appt: Visit date not found  [facility] [phone]

## 2022-06-07 NOTE — Patient Instructions (Addendum)
Stop taking Trazodone  Please call [facility] and let us know who you saw for diabetic eye exam so we can try to get note.

## 2022-06-08 LAB — HEMOGLOBIN A1C
Hgb A1c MFr Bld: 7 % of total Hgb — ABNORMAL HIGH (ref <5.7)
Mean Plasma Glucose: 154 mg/dL
eAG (mmol/L): 8.5 mmol/L

## 2022-07-14 ENCOUNTER — Other Ambulatory Visit: Payer: Self-pay | Admitting: Physical Medicine & Rehabilitation

## 2022-07-14 DIAGNOSIS — G931 Anoxic brain damage, not elsewhere classified: Secondary | ICD-10-CM

## 2022-07-20 ENCOUNTER — Other Ambulatory Visit: Payer: Self-pay | Admitting: Physical Medicine & Rehabilitation

## 2022-07-20 DIAGNOSIS — F321 Major depressive disorder, single episode, moderate: Secondary | ICD-10-CM

## 2022-08-11 ENCOUNTER — Other Ambulatory Visit: Payer: Self-pay

## 2022-08-11 ENCOUNTER — Emergency Department (HOSPITAL_BASED_OUTPATIENT_CLINIC_OR_DEPARTMENT_OTHER)
Admission: EM | Admit: 2022-08-11 | Discharge: 2022-08-11 | Disposition: A | Discharge status: Home / Self Care | Payer: Medicaid Other | Attending: Emergency Medicine | Admitting: Emergency Medicine

## 2022-08-11 ENCOUNTER — Emergency Department (HOSPITAL_BASED_OUTPATIENT_CLINIC_OR_DEPARTMENT_OTHER): Payer: Medicaid Other

## 2022-08-11 ENCOUNTER — Encounter (HOSPITAL_BASED_OUTPATIENT_CLINIC_OR_DEPARTMENT_OTHER): Payer: Self-pay | Admitting: Emergency Medicine

## 2022-08-11 DIAGNOSIS — Z794 Long term (current) use of insulin: Secondary | ICD-10-CM | POA: Insufficient documentation

## 2022-08-11 DIAGNOSIS — W19XXXA Unspecified fall, initial encounter: Secondary | ICD-10-CM | POA: Diagnosis not present

## 2022-08-11 DIAGNOSIS — M25522 Pain in left elbow: Secondary | ICD-10-CM | POA: Insufficient documentation

## 2022-08-11 DIAGNOSIS — Z91040 Latex allergy status: Secondary | ICD-10-CM | POA: Diagnosis not present

## 2022-08-11 DIAGNOSIS — M79602 Pain in left arm: Secondary | ICD-10-CM | POA: Diagnosis not present

## 2022-08-11 HISTORY — DX: Anoxic brain damage, not elsewhere classified: G93.1

## 2022-08-11 HISTORY — DX: Cardiac arrest, cause unspecified: I46.9

## 2022-08-11 MED ORDER — ACETAMINOPHEN 500 MG PO TABS: 500.0000 mg | ORAL_TABLET | Freq: Four times a day (QID) | ORAL | 0 refills | Status: DC | PRN

## 2022-08-11 MED ORDER — IBUPROFEN 600 MG PO TABS: 600.0000 mg | ORAL_TABLET | Freq: Four times a day (QID) | ORAL | 0 refills | Status: DC | PRN

## 2022-08-11 MED ORDER — LIDOCAINE 5 % EX PTCH
1.0000 | MEDICATED_PATCH | CUTANEOUS | Status: DC
  Administered 2022-08-11: 1 via TRANSDERMAL
  Filled 2022-08-11: qty 1

## 2022-08-11 MED ORDER — HYDROCODONE-ACETAMINOPHEN 5-325 MG PO TABS
1.0000 | ORAL_TABLET | Freq: Once | ORAL | Status: AC
  Administered 2022-08-11: 1 via ORAL
  Filled 2022-08-11: qty 1

## 2022-08-11 NOTE — ED Provider Notes (Signed)
[facility] [facility] Provider Note   CSN: [id number] Arrival date & time: 08/11/22  1744     History  Chief Complaint  Patient presents with   Arm Pain    Courtney Davies is a 47 y.o. female presenting today with left shoulder and elbow pain.  Reports a fall earlier today caused by slipping on her shoes.  No dizziness beforehand.  History of neuropathy and several falls.  Believes that she struck her head and left upper extremity on the ground.  No LOC, no blood thinners.  Says that she is having difficulty moving her left arm.   Arm Pain       Home Medications Prior to Admission medications   Medication Sig Start Date End Date Taking? Authorizing Provider  acetaminophen (TYLENOL) 500 MG tablet Take 1 tablet (500 mg total) by mouth every 6 (six) hours as needed. 08/11/22  Yes Marjani Kobel A, PA-C  ibuprofen (ADVIL) 600 MG tablet Take 1 tablet (600 mg total) by mouth every 6 (six) hours as needed. 08/11/22  Yes Ryka Beighley A, PA-C  ACCU-CHEK GUIDE test strip  08/03/21   [provider]  albuterol (VENTOLIN HFA) 108 (90 Base) MCG/ACT inhaler Inhale 2 puffs into the lungs every 6 (six) hours as needed for wheezing or shortness of breath. 01/24/21   Love, Evlyn Kanner, PA-C  amiodarone (PACERONE) 200 MG tablet Take 200 mg by mouth daily.    [provider]  BD PEN NEEDLE NANO 2ND GEN 32G X 4 MM MISC 3 (three) times daily. as directed 07/11/21   [provider]  Blood Glucose Monitoring Suppl (ACCU-CHEK GUIDE ME) w/Device KIT SMARTSIG:Via Meter 08/02/21   [provider]  carvedilol (COREG) 6.25 MG tablet Take 1 tablet (6.25 mg total) by mouth 2 (two) times daily with a meal. 01/24/21   Love, Evlyn Kanner, PA-C  Dulaglutide (TRULICITY) 0.75 MG/0.5ML SOPN Inject 0.75 mg into the skin once a week.    [provider]  empagliflozin (JARDIANCE) 10 MG TABS tablet Take 10 mg by mouth every morning.    [provider]  fluticasone furoate-vilanterol (BREO ELLIPTA) 200-25 MCG/ACT AEPB Inhale 1 puff into the lungs daily. 09/25/21   Icard, Rachel Bo, DO  gabapentin (NEURONTIN) 100 MG capsule Take 1-3 capsules (100-300 mg total) by mouth at bedtime as needed. 04/06/22   Tarry Kos, MD  HUMALOG KWIKPEN 100 UNIT/ML KwikPen Inject 14-15 Units into the skin as needed (3 times daily for high blood glucose.). 08/02/21   [provider]  hydrOXYzine (VISTARIL) 25 MG capsule Take 1 capsule (25 mg total) by mouth at bedtime as needed. 06/07/22   [name], [name], NP  Insulin Syringe-Needle U-100 (INSULIN SYRINGE .5CC/31GX5/16") 31G X 5/16" 0.5 ML MISC 3 (three) times daily. 07/12/21   [provider]  ketoconazole (NIZORAL) 2 % cream Apply 1 Application topically daily.    [provider]  LANTUS SOLOSTAR 100 UNIT/ML Solostar Pen Inject 30 Units into the skin at bedtime. 08/03/21   [provider]  LINZESS 290 MCG CAPS capsule Take 290 mcg by mouth daily as needed (Constipation). 03/01/21   [provider]  losartan (COZAAR) 25 MG tablet Take 1 tablet (25 mg total) by mouth daily. 01/24/21   Love, Evlyn Kanner, PA-C  meloxicam (MOBIC) 7.5 MG tablet Take 7.5 mg by mouth daily. 04/21/21   [provider]  metformin (FORTAMET) 500 MG (OSM) 24 hr tablet Take 1,000 mg  by mouth daily with breakfast.    [provider]  pantoprazole (PROTONIX) 40 MG tablet Take 1 tablet (40 mg total) by mouth 2 (two) times daily. 01/24/21   Love, Evlyn Kanner, PA-C  QUEtiapine (SEROQUEL) 25 MG tablet TAKE 1 TABLET(25 MG) BY MOUTH AT BEDTIME 07/20/22   [name], MD  rivastigmine (EXELON) 3 MG capsule TAKE 1 CAPSULE(3 MG) BY MOUTH TWICE DAILY 07/16/22   [name], MD  sertraline (ZOLOFT) 50 MG tablet TAKE 1 TABLET(25 MG) BY MOUTH DAILY 03/01/22   [name], [name], NP  SYMBICORT 160-4.5 MCG/ACT inhaler Inhale 2 puffs into the lungs in the morning and at bedtime. 06/23/21   Josephine Igo, DO      Allergies    Latex, Other, Codeine, and Nitrofurantoin    Review of Systems   Review of Systems  Physical Exam Updated Vital Signs BP 128/85 (BP Location: Right Arm)   Pulse 76   Temp 98.4 F (36.9 C) (Oral)   Resp 18   Ht 5' 5.5" (1.664 m)   Wt 106.6 kg   LMP 12/03/2012   SpO2 98%   BMI 38.51 kg/m  Physical Exam Vitals and nursing note reviewed.  Constitutional:      Appearance: Normal appearance.  HENT:     Head: Normocephalic and atraumatic.  Eyes:     General: No scleral icterus.    Conjunctiva/sclera: Conjunctivae normal.     Pupils: Pupils are equal, round, and reactive to light.  Pulmonary:     Effort: Pulmonary effort is normal. No respiratory distress.  Musculoskeletal:     Comments: Normal strength in bilateral upper and lower extremities.  Active range of motion with shoulder flexion and abduction limited however full range of motion passively.  Strong radial pulse  Skin:    Findings: No rash.  Neurological:     Mental Status: She is alert.     Comments: Alert and oriented, no facial droop or aphasia.  PERRLA.  Normal strength in upper extremities.  Ambulatory.  Psychiatric:        Mood and Affect: Mood normal.     ED Results / Procedures / Treatments   Labs (all labs ordered are listed, but only abnormal results are displayed) Labs Reviewed - No data to display  EKG None  Radiology DG Shoulder Left  Result Date: 08/11/2022 CLINICAL DATA:  Pain EXAM: LEFT SHOULDER - 2+ VIEW COMPARISON:  None Available. FINDINGS: There is no evidence of fracture or dislocation. There is no evidence of arthropathy or other focal bone abnormality. Soft tissues are unremarkable. IMPRESSION: Negative. Electronically Signed   By: Darliss Cheney M.D.   On: 08/11/2022 19:32    Procedures Procedures   Medications Ordered in ED Medications  lidocaine (LIDODERM) 5 % 1 patch (1 patch Transdermal Patch Applied 08/11/22 2110)  HYDROcodone-acetaminophen  (NORCO/VICODIN) 5-325 MG per tablet 1 tablet (1 tablet Oral Given 08/11/22 2110)    ED Course/ Medical Decision Making/ A&P                             Medical Decision Making Amount and/or Complexity of Data Reviewed Radiology: ordered.  Risk OTC drugs. Prescription drug management.   47 year old female presenting today after a fall.  Differential for the fall includes but is not limited to arrhythmia, seizure, syncope, hypoglycemia, CVA.  This is not exhaustive.  Patient did tell me that she struck her head on the  ground.  There are no deformities to the face.  No tenderness either.  PERRLA with EOMs intact.  Not complaining of headache or neurologic symptoms, do not believe CT is indicated at this time.  Physical exam: No tenderness to the cervical spine.  Tenderness located over the left shoulder and humerus.  Full range of motion of the neck  X-ray of left upper extremity ordered in triage.  Negative.  Treatment: Hydrocodone, lidocaine patch and sling  MDM/disposition: 47 year old female presenting today with left shoulder pain after falling down.  Also did strike her head but no loss consciousness or blood thinners.  She had a normal neuroexam and not complaining of visual disturbance, do not believe she needed a CT scan.  X-ray of her shoulder was performed which was negative.  She is neurovascularly intact with a strong radial pulse and full range of motion of the digits.  Normal strength finger grip.  No deformities.  Some limitations in active range of motion secondary to pain but passive range of motion intact.  Do not believe she needs CT scan or emergent MRI.  At this time patient is stable to follow-up outpatient with orthopedics as needed.  She will use NSAIDs, Tylenol, muscle rubs and do her best to rest the limb.  Strict return precautions were given    Final Clinical Impression(s) / ED Diagnoses Final diagnoses:  Left arm pain    Rx / DC Orders ED Discharge Orders           Ordered    ibuprofen (ADVIL) 600 MG tablet  Every 6 hours PRN        08/11/22 2055    acetaminophen (TYLENOL) 500 MG tablet  Every 6 hours PRN        08/11/22 2055           Results and diagnoses were explained to the patient. Return precautions discussed in full. Patient had no additional questions and expressed complete understanding.   This chart was dictated using voice recognition software.  Despite best efforts to proofread,  errors can occur which can change the documentation meaning.    Courtney Davies 08/11/22 2138    Lonell Grandchild, MD 08/12/22 1200

## 2022-08-11 NOTE — Discharge Instructions (Signed)
You came to the emergency department today after a fall.  Your x-ray looks normal.  You may use Tylenol and ibuprofen for your discomfort.  I sent prescription strength of these to your pharmacy.  If you are unable to get these you may also purchase them over-the-counter.  Ice and any other over-the-counter remedies such as Voltaren gel, IcyHot or heat packs may also be helpful.  Please follow-up with your orthopedic doctor about your concern for your knee as well.  It was a pleasure to meet you and we hope you feel better!

## 2022-08-11 NOTE — ED Triage Notes (Signed)
Pt reports LT neck and  LUE pain and difficulty moving arm today; sts she did fall earlier, but did not land on that side

## 2022-08-13 ENCOUNTER — Telehealth: Payer: Self-pay

## 2022-08-13 NOTE — Transitions of Care (Post Inpatient/ED Visit) (Signed)
   08/13/2022  Name: KEISHAWNA CARRANZA MRN: [id number] DOB: 09/21/1975  Today's TOC FU Call Status: Today's TOC FU Call Status:: Successful TOC FU Call Competed TOC FU Call Complete Date: 08/13/22  Transition Care Management Follow-up Telephone Call Date of Discharge: 08/11/22 Discharge Facility: [facility] Type of Discharge: Emergency Department Reason for ED Visit: Other: (Left arm pain.) How have you been since you were released from the hospital?: Same Any questions or concerns?: Yes Patient Questions/Concerns:: Patient will save questions for appointment.  Items Reviewed: Did you receive and understand the discharge instructions provided?: Yes Medications obtained and verified?: Yes (Medications Reviewed) Any new allergies since your discharge?: No Dietary orders reviewed?: No Do you have support at home?: Yes People in Home: alone  Home Care and Equipment/Supplies: Were Home Health Services Ordered?: No Any new equipment or medical supplies ordered?: No  Functional Questionnaire: Do you need assistance with bathing/showering or dressing?: No Do you need assistance with meal preparation?: No Do you need assistance with eating?: No Do you have difficulty maintaining continence: No Do you need assistance with getting out of bed/getting out of a chair/moving?: No Do you have difficulty managing or taking your medications?: No  Follow up appointments reviewed: PCP Follow-up appointment confirmed?: Yes Date of PCP follow-up appointment?: 08/16/22 Follow-up Provider: Hazle Nordmann, NP Special[facility] Hospital Follow-up appointment confirmed?: NA Do you need transportation to your follow-up appointment?: No Do you understand care options if your condition(s) worsen?: Yes-patient verbalized understanding    SIGNATURE: Silas Muff.D/RMA

## 2022-08-15 ENCOUNTER — Ambulatory Visit (INDEPENDENT_AMBULATORY_CARE_PROVIDER_SITE_OTHER): Payer: Medicaid Other | Admitting: Adult Health

## 2022-08-15 ENCOUNTER — Encounter: Payer: Self-pay | Admitting: Adult Health

## 2022-08-15 VITALS — BP 121/88 | HR 71 | Temp 97.2°F | Resp 18 | Ht 65.5 in

## 2022-08-15 DIAGNOSIS — G8929 Other chronic pain: Secondary | ICD-10-CM

## 2022-08-15 DIAGNOSIS — E1169 Type 2 diabetes mellitus with other specified complication: Secondary | ICD-10-CM

## 2022-08-15 DIAGNOSIS — M25562 Pain in left knee: Secondary | ICD-10-CM

## 2022-08-15 DIAGNOSIS — F329 Major depressive disorder, single episode, unspecified: Secondary | ICD-10-CM

## 2022-08-15 DIAGNOSIS — I10 Essential (primary) hypertension: Secondary | ICD-10-CM | POA: Diagnosis not present

## 2022-08-15 DIAGNOSIS — Z794 Long term (current) use of insulin: Secondary | ICD-10-CM

## 2022-08-15 MED ORDER — SERTRALINE HCL 50 MG PO TABS
50.0000 mg | ORAL_TABLET | Freq: Every day | ORAL | 5 refills | Status: DC
Start: 2022-08-15 — End: 2022-08-16

## 2022-08-15 MED ORDER — DICLOFENAC SODIUM 1 % EX GEL
4.0000 g | Freq: Four times a day (QID) | CUTANEOUS | 3 refills | Status: DC
Start: 2022-08-15 — End: 2023-07-11

## 2022-08-15 NOTE — Progress Notes (Incomplete)
[facility] clinic  Provider:  Kenard Gower DNP  Code Status:  Full Code  Goals of Care:     08/11/2022    6:27 PM  Advanced Directives  Does Patient Have a Medical Advance Directive? No     Chief Complaint  Patient presents with  . Acute Visit    Knee Pain     HPI: Patient is a 47 y.o. female seen today for an acute visit for knee pain. She complained of 10/10 left knee pain. She denies having trauma to it. She said that she sits down in the house all day. She scored 12 on PHQ-9 which ranges in moderate depression. She takes Sertraline 25 mg daily for depression.  BP toay was   Past Medical History:  Diagnosis Date  . Anemia 09/2015  . Anemia   . Anoxic brain injury (HCC)   . Arthritis    "knees" (04/23/2017)  . Asthma    "teens; went away; came back" (04/23/2017)  . B12 deficiency anemia 04/23/2017  . Cardiac arrest (HCC)   . Chronic bronchitis (HCC)   . Chronic low back pain with sciatica   . Chronic lower back pain   . Diabetes mellitus without complication (HCC)   . Diabetes type 2, controlled (HCC)   . Elevated ferritin level   . Fatty liver   . GERD (gastroesophageal reflux disease)   . GERD with stricture   . Headache    "1-2/wk" (04/23/2017)  . History of blood transfusion "plenty"   "related to anemia" (04/23/2017)  . Hypertension   . Inguinal hernia   . Low back pain   . Migraine    "1-2/month" (04/23/2017)  . OA (osteoarthritis) of knee--left   . Symptomatic anemia 04/23/2017  . Vitamin B 12 deficiency     Past Surgical History:  Procedure Laterality Date  . ABDOMINAL HYSTERECTOMY  YRS AGO   COMPLETE  . CESAREAN SECTION  1998; 2002  . ESOPHAGEAL DILATION  02/2020   by Dr Lanae Boast  . INGUINAL HERNIA REPAIR Bilateral 1980s    "total of 4 surgeries" (04/23/2017)  . IR GASTROSTOMY TUBE MOD SED  12/13/2020  . KNEE ARTHROSCOPY WITH MEDIAL MENISECTOMY Left 01/30/2018   Procedure: LEFT KNEE ARTHROSCOPY WITH PARTIAL LATERAL MENISCECTOMY;  Surgeon:  Kathryne Hitch, MD;  Location: [facility];  Service: Orthopedics;  Laterality: Left;  . KNEE CARTILAGE SURGERY Left   . SUBQ ICD IMPLANT N/A 09/13/2021   Procedure: SUBQ ICD IMPLANT;  Surgeon: [name], MD;  Location: [facility] INVASIVE CV LAB;  Service: Cardiovascular;  Laterality: N/A;  . TUBAL LIGATION  2002    Allergies  Allergen Reactions  . Latex Hives  . Other Other (See Comments), Anaphylaxis, Swelling and Hives    Hair glue causes throat to close and hives "glue" Hair glue causes throat to close   . Codeine Nausea And Vomiting    Was on an empty stomach.  . Nitrofurantoin Rash    Outpatient Encounter Medications as of 08/15/2022  Medication Sig  . ACCU-CHEK GUIDE test strip   . acetaminophen (TYLENOL) 500 MG tablet Take 1 tablet (500 mg total) by mouth every 6 (six) hours as needed.  Marland Kitchen albuterol (VENTOLIN HFA) 108 (90 Base) MCG/ACT inhaler Inhale 2 puffs into the lungs every 6 (six) hours as needed for wheezing or shortness of breath.  Marland Kitchen amiodarone (PACERONE) 200 MG tablet Take 200 mg by mouth daily.  . BD PEN NEEDLE NANO 2ND GEN 32G X 4 MM MISC  3 (three) times daily. as directed  . Blood Glucose Monitoring Suppl (ACCU-CHEK GUIDE ME) w/Device KIT SMARTSIG:Via Meter  . carvedilol (COREG) 6.25 MG tablet Take 1 tablet (6.25 mg total) by mouth 2 (two) times daily with a meal.  . diclofenac Sodium (VOLTAREN) 1 % GEL Apply 4 g topically 4 (four) times daily. Apply to left knee  . Dulaglutide (TRULICITY) 0.75 MG/0.5ML SOPN Inject 0.75 mg into the skin once a week.  . empagliflozin (JARDIANCE) 10 MG TABS tablet Take 10 mg by mouth every morning.  . fluticasone furoate-vilanterol (BREO ELLIPTA) 200-25 MCG/ACT AEPB Inhale 1 puff into the lungs daily.  Marland Kitchen gabapentin (NEURONTIN) 100 MG capsule Take 1-3 capsules (100-300 mg total) by mouth at bedtime as needed.  Marland Kitchen HUMALOG KWIKPEN 100 UNIT/ML KwikPen Inject 14-15 Units into the skin as needed (3 times daily for high blood  glucose.).  Marland Kitchen hydrOXYzine (VISTARIL) 25 MG capsule Take 1 capsule (25 mg total) by mouth at bedtime as needed.  . Insulin Syringe-Needle U-100 (INSULIN SYRINGE .5CC/31GX5/16") 31G X 5/16" 0.5 ML MISC 3 (three) times daily.  Marland Kitchen ketoconazole (NIZORAL) 2 % cream Apply 1 Application topically daily.  Marland Kitchen LANTUS SOLOSTAR 100 UNIT/ML Solostar Pen Inject 30 Units into the skin at bedtime.  Marland Kitchen LINZESS 290 MCG CAPS capsule Take 290 mcg by mouth daily as needed (Constipation).  Marland Kitchen losartan (COZAAR) 25 MG tablet Take 1 tablet (25 mg total) by mouth daily.  . metformin (FORTAMET) 500 MG (OSM) 24 hr tablet Take 1,000 mg by mouth daily with breakfast.  . pantoprazole (PROTONIX) 40 MG tablet Take 1 tablet (40 mg total) by mouth 2 (two) times daily.  . QUEtiapine (SEROQUEL) 25 MG tablet TAKE 1 TABLET(25 MG) BY MOUTH AT BEDTIME  . rivastigmine (EXELON) 3 MG capsule TAKE 1 CAPSULE(3 MG) BY MOUTH TWICE DAILY  . SYMBICORT 160-4.5 MCG/ACT inhaler Inhale 2 puffs into the lungs in the morning and at bedtime.  . [DISCONTINUED] ibuprofen (ADVIL) 600 MG tablet Take 1 tablet (600 mg total) by mouth every 6 (six) hours as needed.  . [DISCONTINUED] meloxicam (MOBIC) 7.5 MG tablet Take 7.5 mg by mouth daily.  . [DISCONTINUED] sertraline (ZOLOFT) 50 MG tablet TAKE 1 TABLET(25 MG) BY MOUTH DAILY  . sertraline (ZOLOFT) 50 MG tablet Take 1 tablet (50 mg total) by mouth daily. TAKE 1 TABLET(25 MG) BY MOUTH DAILY   No facility-administered encounter medications on file as of 08/15/2022.    Review of Systems:  Review of Systems  Constitutional:  Negative for appetite change, chills, fatigue and fever.  HENT:  Negative for congestion, hearing loss, rhinorrhea and sore throat.   Eyes: Negative.   Respiratory:  Negative for cough, shortness of breath and wheezing.   Cardiovascular:  Negative for chest pain, palpitations and leg swelling.  Gastrointestinal:  Negative for abdominal pain, constipation, diarrhea, nausea and vomiting.   Genitourinary:  Negative for dysuria.  Musculoskeletal:  Negative for arthralgias, back pain and myalgias.       Left knee pain X 2 days  Skin:  Negative for color change, rash and wound.  Neurological:  Negative for dizziness, weakness and headaches.  Psychiatric/Behavioral:  Negative for behavioral problems. The patient is not nervous/anxious.     Health Maintenance  Topic Date Due  . COVID-19 Vaccine (3 - 2023-24 season) 12/14/2022 (Originally 12/15/2021)  . INFLUENZA VACCINE  11/15/2022  . HEMOGLOBIN A1C  12/06/2022  . Diabetic kidney evaluation - eGFR measurement  02/09/2023  . Diabetic kidney evaluation - Urine  ACR  03/02/2023  . FOOT EXAM  03/02/2023  . OPHTHALMOLOGY EXAM  05/24/2023  . PAP SMEAR-Modifier  05/26/2023  . DTaP/Tdap/Td (2 - Td or Tdap) 11/06/2025  . COLONOSCOPY (Pts 45-94yrs Insurance coverage will need to be confirmed)  12/04/2025  . Hepatitis C Screening  Completed  . HIV Screening  Completed  . HPV VACCINES  Aged Out    Physical Exam: Vitals:   08/15/22 1337  BP: 121/88  Pulse: 71  Resp: 18  Temp: (!) 97.2 F (36.2 C)  SpO2: 98%  Height: 5' 5.5" (1.664 m)   Body mass index is 38.51 kg/m. Physical Exam Constitutional:      Appearance: She is obese.  HENT:     Head: Normocephalic and atraumatic.     Nose: Nose normal.     Mouth/Throat:     Mouth: Mucous membranes are moist.  Eyes:     Conjunctiva/sclera: Conjunctivae normal.  Cardiovascular:     Rate and Rhythm: Normal rate and regular rhythm.  Pulmonary:     Effort: Pulmonary effort is normal.     Breath sounds: Normal breath sounds.  Abdominal:     General: Bowel sounds are normal.     Palpations: Abdomen is soft.  Musculoskeletal:        General: No deformity.     Cervical back: Normal range of motion.     Comments: Left knee tender and warm to touch  Skin:    General: Skin is warm and dry.  Neurological:     General: No focal deficit present.     Mental Status: She is alert and  oriented to person, place, and time.  Psychiatric:        Mood and Affect: Mood normal.        Behavior: Behavior normal.        Thought Content: Thought content normal.        Judgment: Judgment normal.     Labs reviewed: Basic Metabolic Panel: Recent Labs    02/08/22 1104  NA 136  K 4.0  CL 103  CO2 22  GLUCOSE 134*  BUN 15  CREATININE 0.94  CALCIUM 9.2   Liver Function Tests: Recent Labs    02/08/22 1104  AST 13  ALT 11  BILITOT 0.4  PROT 7.3   No results for input(s): "LIPASE", "AMYLASE" in the last 8760 hours. No results for input(s): "AMMONIA" in the last 8760 hours. CBC: Recent Labs    02/08/22 1104  WBC 5.5  NEUTROABS 2,728  HGB 12.8  HCT 41.1  MCV 73.4*  PLT 262   Lipid Panel: No results for input(s): "CHOL", "HDL", "LDLCALC", "TRIG", "CHOLHDL", "LDLDIRECT" in the last 8760 hours. Lab Results  Component Value Date   HGBA1C 7.0 (H) 06/07/2022    Procedures since last visit: DG Shoulder Left  Result Date: 08/11/2022 CLINICAL DATA:  Pain EXAM: LEFT SHOULDER - 2+ VIEW COMPARISON:  None Available. FINDINGS: There is no evidence of fracture or dislocation. There is no evidence of arthropathy or other focal bone abnormality. Soft tissues are unremarkable. IMPRESSION: Negative. Electronically Signed   By: Darliss Cheney M.D.   On: 08/11/2022 19:32    Assessment/Plan     Labs/tests ordered:  * No order type specified * Next appt:  08/16/2022

## 2022-08-15 NOTE — Progress Notes (Unsigned)
[facility] clinic  Provider:  Kenard Gower DNP  Code Status:  Full Code  Goals of Care:     08/11/2022    6:27 PM  Advanced Directives  Does Patient Have a Medical Advance Directive? No     Chief Complaint  Patient presents with   Acute Visit    Knee Pain     HPI: Patient is a 47 y.o. female seen today for an acute visit for knee pain.  Past Medical History:  Diagnosis Date   Anemia 09/2015   Anemia    Anoxic brain injury (HCC)    Arthritis    "knees" (04/23/2017)   Asthma    "teens; went away; came back" (04/23/2017)   B12 deficiency anemia 04/23/2017   Cardiac arrest (HCC)    Chronic bronchitis (HCC)    Chronic low back pain with sciatica    Chronic lower back pain    Diabetes mellitus without complication (HCC)    Diabetes type 2, controlled (HCC)    Elevated ferritin level    Fatty liver    GERD (gastroesophageal reflux disease)    GERD with stricture    Headache    "1-2/wk" (04/23/2017)   History of blood transfusion "plenty"   "related to anemia" (04/23/2017)   Hypertension    Inguinal hernia    Low back pain    Migraine    "1-2/month" (04/23/2017)   OA (osteoarthritis) of knee--left    Symptomatic anemia 04/23/2017   Vitamin B 12 deficiency     Past Surgical History:  Procedure Laterality Date   ABDOMINAL HYSTERECTOMY  YRS AGO   COMPLETE   CESAREAN SECTION  1998; 2002   ESOPHAGEAL DILATION  02/2020   by Dr Lanae Boast   INGUINAL HERNIA REPAIR Bilateral 1980s    "total of 4 surgeries" (04/23/2017)   IR GASTROSTOMY TUBE MOD SED  12/13/2020   KNEE ARTHROSCOPY WITH MEDIAL MENISECTOMY Left 01/30/2018   Procedure: LEFT KNEE ARTHROSCOPY WITH PARTIAL LATERAL MENISCECTOMY;  Surgeon: Kathryne Hitch, MD;  Location: [facility];  Service: Orthopedics;  Laterality: Left;   KNEE CARTILAGE SURGERY Left    SUBQ ICD IMPLANT N/A 09/13/2021   Procedure: SUBQ ICD IMPLANT;  Surgeon: [name], MD;  Location: [facility] INVASIVE CV LAB;  Service: Cardiovascular;   Laterality: N/A;   TUBAL LIGATION  2002    Allergies  Allergen Reactions   Latex Hives   Other Other (See Comments), Anaphylaxis, Swelling and Hives    Hair glue causes throat to close and hives "glue" Hair glue causes throat to close    Codeine Nausea And Vomiting    Was on an empty stomach.   Nitrofurantoin Rash    Outpatient Encounter Medications as of 08/15/2022  Medication Sig   ACCU-CHEK GUIDE test strip    acetaminophen (TYLENOL) 500 MG tablet Take 1 tablet (500 mg total) by mouth every 6 (six) hours as needed.   albuterol (VENTOLIN HFA) 108 (90 Base) MCG/ACT inhaler Inhale 2 puffs into the lungs every 6 (six) hours as needed for wheezing or shortness of breath.   amiodarone (PACERONE) 200 MG tablet Take 200 mg by mouth daily.   BD PEN NEEDLE NANO 2ND GEN 32G X 4 MM MISC 3 (three) times daily. as directed   Blood Glucose Monitoring Suppl (ACCU-CHEK GUIDE ME) w/Device KIT SMARTSIG:Via Meter   carvedilol (COREG) 6.25 MG tablet Take 1 tablet (6.25 mg total) by mouth 2 (two) times daily with a meal.   diclofenac Sodium (VOLTAREN) 1 %  GEL Apply 4 g topically 4 (four) times daily. Apply to left knee   Dulaglutide (TRULICITY) 0.75 MG/0.5ML SOPN Inject 0.75 mg into the skin once a week.   empagliflozin (JARDIANCE) 10 MG TABS tablet Take 10 mg by mouth every morning.   fluticasone furoate-vilanterol (BREO ELLIPTA) 200-25 MCG/ACT AEPB Inhale 1 puff into the lungs daily.   gabapentin (NEURONTIN) 100 MG capsule Take 1-3 capsules (100-300 mg total) by mouth at bedtime as needed.   HUMALOG KWIKPEN 100 UNIT/ML KwikPen Inject 14-15 Units into the skin as needed (3 times daily for high blood glucose.).   hydrOXYzine (VISTARIL) 25 MG capsule Take 1 capsule (25 mg total) by mouth at bedtime as needed.   Insulin Syringe-Needle U-100 (INSULIN SYRINGE .5CC/31GX5/16") 31G X 5/16" 0.5 ML MISC 3 (three) times daily.   ketoconazole (NIZORAL) 2 % cream Apply 1 Application topically daily.   LANTUS  SOLOSTAR 100 UNIT/ML Solostar Pen Inject 30 Units into the skin at bedtime.   LINZESS 290 MCG CAPS capsule Take 290 mcg by mouth daily as needed (Constipation).   losartan (COZAAR) 25 MG tablet Take 1 tablet (25 mg total) by mouth daily.   metformin (FORTAMET) 500 MG (OSM) 24 hr tablet Take 1,000 mg by mouth daily with breakfast.   pantoprazole (PROTONIX) 40 MG tablet Take 1 tablet (40 mg total) by mouth 2 (two) times daily.   QUEtiapine (SEROQUEL) 25 MG tablet TAKE 1 TABLET(25 MG) BY MOUTH AT BEDTIME   rivastigmine (EXELON) 3 MG capsule TAKE 1 CAPSULE(3 MG) BY MOUTH TWICE DAILY   SYMBICORT 160-4.5 MCG/ACT inhaler Inhale 2 puffs into the lungs in the morning and at bedtime.   [DISCONTINUED] ibuprofen (ADVIL) 600 MG tablet Take 1 tablet (600 mg total) by mouth every 6 (six) hours as needed.   [DISCONTINUED] meloxicam (MOBIC) 7.5 MG tablet Take 7.5 mg by mouth daily.   [DISCONTINUED] sertraline (ZOLOFT) 50 MG tablet TAKE 1 TABLET(25 MG) BY MOUTH DAILY   sertraline (ZOLOFT) 50 MG tablet Take 1 tablet (50 mg total) by mouth daily. TAKE 1 TABLET(25 MG) BY MOUTH DAILY   No facility-administered encounter medications on file as of 08/15/2022.    Review of Systems:  Review of Systems  Constitutional:  Negative for appetite change, chills, fatigue and fever.  HENT:  Negative for congestion, hearing loss, rhinorrhea and sore throat.   Eyes: Negative.   Respiratory:  Negative for cough, shortness of breath and wheezing.   Cardiovascular:  Negative for chest pain, palpitations and leg swelling.  Gastrointestinal:  Negative for abdominal pain, constipation, diarrhea, nausea and vomiting.  Genitourinary:  Negative for dysuria.  Musculoskeletal:  Negative for arthralgias, back pain and myalgias.       Left knee pain X 2 days  Skin:  Negative for color change, rash and wound.  Neurological:  Negative for dizziness, weakness and headaches.  Psychiatric/Behavioral:  Negative for behavioral problems. The  patient is not nervous/anxious.     Health Maintenance  Topic Date Due   COVID-19 Vaccine (3 - 2023-24 season) 12/14/2022 (Originally 12/15/2021)   INFLUENZA VACCINE  11/15/2022   HEMOGLOBIN A1C  12/06/2022   Diabetic kidney evaluation - eGFR measurement  02/09/2023   Diabetic kidney evaluation - Urine ACR  03/02/2023   FOOT EXAM  03/02/2023   OPHTHALMOLOGY EXAM  05/24/2023   PAP SMEAR-Modifier  05/26/2023   DTaP/Tdap/Td (2 - Td or Tdap) 11/06/2025   COLONOSCOPY (Pts 45-46yrs Insurance coverage will need to be confirmed)  12/04/2025   Hepatitis C  Screening  Completed   HIV Screening  Completed   HPV VACCINES  Aged Out    Physical Exam: Vitals:   08/15/22 1337  BP: 121/88  Pulse: 71  Resp: 18  Temp: (!) 97.2 F (36.2 C)  SpO2: 98%  Height: 5' 5.5" (1.664 m)   Body mass index is 38.51 kg/m. Physical Exam Constitutional:      Appearance: She is obese.  HENT:     Head: Normocephalic and atraumatic.     Nose: Nose normal.     Mouth/Throat:     Mouth: Mucous membranes are moist.  Eyes:     Conjunctiva/sclera: Conjunctivae normal.  Cardiovascular:     Rate and Rhythm: Normal rate and regular rhythm.  Pulmonary:     Effort: Pulmonary effort is normal.     Breath sounds: Normal breath sounds.  Abdominal:     General: Bowel sounds are normal.     Palpations: Abdomen is soft.  Musculoskeletal:        General: No deformity.     Cervical back: Normal range of motion.     Comments: Left knee tender and warm to touch  Skin:    General: Skin is warm and dry.  Neurological:     General: No focal deficit present.     Mental Status: She is alert and oriented to person, place, and time.  Psychiatric:        Mood and Affect: Mood normal.        Behavior: Behavior normal.        Thought Content: Thought content normal.        Judgment: Judgment normal.     Labs reviewed: Basic Metabolic Panel: Recent Labs    02/08/22 1104  NA 136  K 4.0  CL 103  CO2 22  GLUCOSE  134*  BUN 15  CREATININE 0.94  CALCIUM 9.2   Liver Function Tests: Recent Labs    02/08/22 1104  AST 13  ALT 11  BILITOT 0.4  PROT 7.3   No results for input(s): "LIPASE", "AMYLASE" in the last 8760 hours. No results for input(s): "AMMONIA" in the last 8760 hours. CBC: Recent Labs    02/08/22 1104  WBC 5.5  NEUTROABS 2,728  HGB 12.8  HCT 41.1  MCV 73.4*  PLT 262   Lipid Panel: No results for input(s): "CHOL", "HDL", "LDLCALC", "TRIG", "CHOLHDL", "LDLDIRECT" in the last 8760 hours. Lab Results  Component Value Date   HGBA1C 7.0 (H) 06/07/2022    Procedures since last visit: DG Shoulder Left  Result Date: 08/11/2022 CLINICAL DATA:  Pain EXAM: LEFT SHOULDER - 2+ VIEW COMPARISON:  None Available. FINDINGS: There is no evidence of fracture or dislocation. There is no evidence of arthropathy or other focal bone abnormality. Soft tissues are unremarkable. IMPRESSION: Negative. Electronically Signed   By: Darliss Cheney M.D.   On: 08/11/2022 19:32    Assessment/Plan     Labs/tests ordered:  * No order type specified * Next appt:  08/16/2022

## 2022-08-16 ENCOUNTER — Encounter: Payer: Medicaid Other | Admitting: Orthopedic Surgery

## 2022-08-16 ENCOUNTER — Other Ambulatory Visit: Payer: Self-pay | Admitting: Adult Health

## 2022-08-16 DIAGNOSIS — F329 Major depressive disorder, single episode, unspecified: Secondary | ICD-10-CM

## 2022-08-16 MED ORDER — SERTRALINE HCL 50 MG PO TABS: 50.0000 mg | ORAL_TABLET | Freq: Every day | ORAL | 5 refills | Status: DC

## 2022-08-17 ENCOUNTER — Ambulatory Visit: Payer: Medicaid Other | Admitting: Orthopaedic Surgery

## 2022-08-17 ENCOUNTER — Other Ambulatory Visit: Payer: Self-pay

## 2022-08-17 ENCOUNTER — Other Ambulatory Visit (INDEPENDENT_AMBULATORY_CARE_PROVIDER_SITE_OTHER): Payer: Medicaid Other

## 2022-08-17 DIAGNOSIS — M1712 Unilateral primary osteoarthritis, left knee: Secondary | ICD-10-CM

## 2022-08-17 NOTE — Progress Notes (Signed)
This encounter was created in error - please disregard.

## 2022-08-17 NOTE — Progress Notes (Signed)
Office Visit Note   Patient: Courtney Davies           Date of Birth: [birth year]           MRN: [id number] Visit Date: 08/17/2022              Requested by: Gillis Santa, NP [address] [place],  [place] [address] PCP: [name], NP   Assessment & Plan: Visit Diagnoses:  1. Primary osteoarthritis of left knee     Plan: Impression is chronic left knee pain.  At this point, she has only had temporary relief with cortisone injection.  She does have underlying OA but we would like to go ahead and obtain an MRI to assess for structural abnormalities.  She will follow-up with [place] once this is been completed.  Follow-Up Instructions: Return if symptoms worsen or fail to improve.   Orders:  Orders Placed This Encounter  Procedures   XR KNEE 3 VIEW LEFT   No orders of the defined types were placed in this encounter.     Procedures: No procedures performed   Clinical Data: No additional findings.   Subjective: Chief Complaint  Patient presents with   Left Knee - Pain    HPI patient is a pleasant 47 year old female who comes in today with left knee pain for the past few years.  The pain she has is to the entire knee and is described as a constant ache.  Symptoms are worse when she is standing as well as at night.  She has tried Tylenol without significant relief.  She had a cortisone injection a few years ago which unfortunately only helped for about 1 to 2 days.  No previous viscosupplementation injection that she is aware of.  Review of Systems as detailed in HPI.  All others reviewed and are negative.   Objective: Vital Signs: LMP 12/03/2012   Physical Exam well-developed well-nourished female no acute distress.  Alert and oriented x 3.  Ortho Exam left knee exam shows valgus deformity.  Range of motion 0 to 115 degrees.  Medial and lateral joint line tenderness.  Moderate patellofemoral crepitus.  She is neurovascular intact distally.  Specialty  Comments:  No specialty comments available.  Imaging: XR KNEE 3 VIEW LEFT  Result Date: 08/17/2022 X-rays demonstrate moderate tricompartmental degenerative changes worse in the lateral patellofemoral compartments with para-articular osteophyte formation.    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   Cough 04/26/2021   Acute blood loss anemia    Controlled type 2 diabetes mellitus with hyperglycemia (HCC)    Essential hypertension    Anoxic brain injury (HCC) 01/02/2021   Fever 12/20/2020   Abdominal wall cellulitis 12/20/2020   Dysphagia    Goals of care, counseling/discussion    Acute systolic heart failure (HCC)    Encephalopathy acute    Type 1 diabetes mellitus without complication (HCC) 12/02/2020   Hypertension 12/02/2020   GERD (gastroesophageal reflux disease) 12/02/2020   Anemia 12/02/2020   Cardiac arrest (HCC) 12/01/2020   Unilateral primary osteoarthritis, left knee 12/01/2018   Lateral meniscus, posterior horn derangement, left 05/14/2018   Status post arthroscopy of left knee 05/14/2018   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/19/2017   Anemia 04/23/2017   Acute bronchitis 04/23/2017   Chronic back  pain 10/03/2015   Symptomatic anemia 04/10/2014   Bilateral edema of lower extremity    Low back pain with left-sided sciatica    Past Medical History:  Diagnosis Date   Anemia 09/2015   Anemia    Anoxic brain injury (HCC)    Arthritis    "knees" (04/23/2017)   Asthma    "teens; went away; came back" (04/23/2017)   B12 deficiency anemia 04/23/2017   Cardiac arrest (HCC)    Chronic bronchitis (HCC)    Chronic low back pain with sciatica    Chronic lower back pain    Diabetes mellitus without complication (HCC)    Diabetes type 2, controlled  (HCC)    Elevated ferritin level    Fatty liver    GERD (gastroesophageal reflux disease)    GERD with stricture    Headache    "1-2/wk" (04/23/2017)   History of blood transfusion "plenty"   "related to anemia" (04/23/2017)   Hypertension    Inguinal hernia    Low back pain    Migraine    "1-2/month" (04/23/2017)   OA (osteoarthritis) of knee--left    Symptomatic anemia 04/23/2017   Vitamin B 12 deficiency     Family History  Problem Relation Age of Onset   Stroke Mother    Diabetes Mother    Diabetes Father    Stroke Maternal Grandmother    Diabetes Maternal Grandmother    Anemia Paternal Grandmother    Valvular heart disease Paternal Grandmother    Hypertension Mother    Prostate cancer Maternal Uncle        ? intestinal also    Past Surgical History:  Procedure Laterality Date   ABDOMINAL HYSTERECTOMY  YRS AGO   COMPLETE   CESAREAN SECTION  1998; 2002   ESOPHAGEAL DILATION  02/2020   by Dr Lillia [place] HERNIA REPAIR Bilateral 1980s    "total of 4 surgeries" (04/23/2017)   IR GASTROSTOMY TUBE MOD SED  12/13/2020   KNEE ARTHROSCOPY WITH MEDIAL MENISECTOMY Left 01/30/2018   Procedure: LEFT KNEE ARTHROSCOPY WITH PARTIAL LATERAL MENISCECTOMY;  Surgeon: Kathryne Hitch, MD;  Location: [facility];  Service: Orthopedics;  Laterality: Left;   KNEE CARTILAGE SURGERY Left    SUBQ ICD IMPLANT N/A 09/13/2021   Procedure: SUBQ ICD IMPLANT;  Surgeon: [name], MD;  Location: [facility] INVASIVE CV LAB;  Service: Cardiovascular;  Laterality: N/A;   TUBAL LIGATION  2002   Social History   Occupational History   Occupation: Scientist, product/process development in restaurant  Tobacco Use   Smoking status: Former    Types: Cigarettes   Smokeless tobacco: Never  Vaping Use   Vaping Use: Never used  Substance and Sexual Activity   Alcohol use: Yes    Comment: occ   Drug use: Yes    Types: Marijuana    Comment: sometimes   Sexual activity: Not Currently    Birth control/protection: Surgical

## 2022-08-22 ENCOUNTER — Telehealth: Payer: Self-pay | Admitting: Orthopaedic Surgery

## 2022-08-22 NOTE — Telephone Encounter (Signed)
Patient states she is supposed to have som type of testing done for her Knee and she has a pacemaker so she is not able to have it done at the location that called. Please call and advise patient what to do next.

## 2022-08-22 NOTE — Telephone Encounter (Signed)
Is her packmaker MRI compatible?

## 2022-08-23 ENCOUNTER — Other Ambulatory Visit: Payer: Self-pay

## 2022-08-23 DIAGNOSIS — M1712 Unilateral primary osteoarthritis, left knee: Secondary | ICD-10-CM

## 2022-08-23 NOTE — Telephone Encounter (Signed)
Ok CT arthrogram.  Thanks.

## 2022-08-23 NOTE — Telephone Encounter (Signed)
Called and notified patient.

## 2022-08-28 ENCOUNTER — Other Ambulatory Visit: Payer: Self-pay | Admitting: Orthopedic Surgery

## 2022-08-28 ENCOUNTER — Telehealth: Payer: Self-pay

## 2022-08-28 DIAGNOSIS — R296 Repeated falls: Secondary | ICD-10-CM

## 2022-08-28 DIAGNOSIS — G931 Anoxic brain damage, not elsewhere classified: Secondary | ICD-10-CM

## 2022-08-28 DIAGNOSIS — R2681 Unsteadiness on feet: Secondary | ICD-10-CM

## 2022-08-28 NOTE — Telephone Encounter (Signed)
Pharmacy requested refill.  Pended Rx and sent to Amy for approval.  

## 2022-08-28 NOTE — Telephone Encounter (Signed)
Amy, Nurse cas manager with community care called requesting order for shower chair and that it be faxed to [employer] at [phone].  Message sent to Hazle Nordmann, NP

## 2022-08-28 NOTE — Telephone Encounter (Signed)
Orders placed and printed at nurse station.

## 2022-09-05 ENCOUNTER — Encounter: Payer: Medicaid Other | Attending: Physical Medicine & Rehabilitation | Admitting: Physical Medicine & Rehabilitation

## 2022-09-05 ENCOUNTER — Encounter: Payer: Self-pay | Admitting: Physical Medicine & Rehabilitation

## 2022-09-05 VITALS — BP 118/75 | HR 58 | Ht 65.5 in | Wt 245.0 lb

## 2022-09-05 DIAGNOSIS — G931 Anoxic brain damage, not elsewhere classified: Secondary | ICD-10-CM | POA: Insufficient documentation

## 2022-09-05 NOTE — Patient Instructions (Addendum)
ALWAYS FEEL FREE TO CALL OUR OFFICE WITH ANY PROBLEMS OR QUESTIONS [phone])  **PLEASE NOTE** ALL MEDICATION REFILL REQUESTS (INCLUDING CONTROLLED SUBSTANCES) NEED TO BE MADE AT LEAST 7 DAYS PRIOR TO REFILL BEING DUE. ANY REFILL REQUESTS INSIDE THAT TIME FRAME MAY RESULT IN DELAYS IN RECEIVING YOUR PRESCRIPTION.                    [facility] Address: [address] [place], [place] [address] Hours:  Open ? Closes 5?PM Phone: [phone]   TELL THEM THAT YOU'VE HAD AN ANOXIC BRAIN INJURY AND THAT YOUR DOCTOR CLEARED YOU TO START LOOKING FOR A JOB.

## 2022-09-05 NOTE — Progress Notes (Signed)
Subjective:    Patient ID: Courtney Davies, female    DOB: 1975/12/08, 47 y.o.   MRN: [id number]  HPI  Courtney Davies is here in follow up of her anoxic brain injury. She has been busy cleaning up her house. She felt that the exelon is helping with her stm. She is able to stay more organized with her personal matters, papers, etc. She wants to get back to work.  Her mood is improving. She's sleeping well. She remains involved with her chruch and friends.   Orthopedics is following her for her knee. They are looking at doing further imaging to assess. SHe has pain when she bears weight on LLE.   She previously worked for [employer] for customers. She has interest in becoming an aid or being involved with taking care of kids.    Pain Inventory Average Pain 10 Pain Right Now 10 My pain is aching  LOCATION OF PAIN  knee  BOWEL Number of stools per week: 7   BLADDER Normal    Mobility walk without assistance how many minutes can you walk? unlimited ability to climb steps?  yes do you drive?  yes  Function disabled: date disabled . I need assistance with the following:  household duties  Neuro/Psych weakness numbness tremor tingling confusion  Prior Studies Any changes since last visit?  no  Physicians involved in your care Any changes since last visit?  no   Family History  Problem Relation Age of Onset   Stroke Mother    Diabetes Mother    Diabetes Father    Stroke Maternal Grandmother    Diabetes Maternal Grandmother    Anemia Paternal Grandmother    Valvular heart disease Paternal Grandmother    Hypertension Mother    Prostate cancer Maternal Uncle        ? intestinal also   Social History   Socioeconomic History   Marital status: Single    Spouse name: Not on file   Number of children: 2   Years of education: 11th   Highest education level: Not on file  Occupational History   Occupation: salad maker in restaurant  Tobacco Use    Smoking status: Former    Types: Cigarettes   Smokeless tobacco: Never  Vaping Use   Vaping Use: Never used  Substance and Sexual Activity   Alcohol use: Yes    Comment: occ   Drug use: Yes    Types: Marijuana    Comment: sometimes   Sexual activity: Not Currently    Birth control/protection: Surgical  Other Topics Concern   Not on file  Social History Narrative   ** Merged History Encounter **       Lives at home with her two children. Occasional caffeine use. Right-handed.   Social Determinants of Health   Financial Resource Strain: Not on file  Food Insecurity: Not on file  Transportation Needs: Not on file  Physical Activity: Not on file  Stress: Not on file  Social Connections: Not on file   Past Surgical History:  Procedure Laterality Date   ABDOMINAL HYSTERECTOMY  YRS AGO   COMPLETE   CESAREAN SECTION  1998; 2002   ESOPHAGEAL DILATION  02/2020   by Dr Lanae Boast   INGUINAL HERNIA REPAIR Bilateral 1980s    "total of 4 surgeries" (04/23/2017)   IR GASTROSTOMY TUBE MOD SED  12/13/2020   KNEE ARTHROSCOPY WITH MEDIAL MENISECTOMY Left 01/30/2018   Procedure: LEFT KNEE ARTHROSCOPY WITH PARTIAL LATERAL MENISCECTOMY;  Surgeon: Kathryne Hitch, MD;  Location: [facility];  Service: Orthopedics;  Laterality: Left;   KNEE CARTILAGE SURGERY Left    SUBQ ICD IMPLANT N/A 09/13/2021   Procedure: SUBQ ICD IMPLANT;  Surgeon: [name], MD;  Location: [facility] INVASIVE CV LAB;  Service: Cardiovascular;  Laterality: N/A;   TUBAL LIGATION  2002   Past Medical History:  Diagnosis Date   Anemia 09/2015   Anemia    Anoxic brain injury (HCC)    Arthritis    "knees" (04/23/2017)   Asthma    "teens; went away; came back" (04/23/2017)   B12 deficiency anemia 04/23/2017   Cardiac arrest (HCC)    Chronic bronchitis (HCC)    Chronic low back pain with sciatica    Chronic lower back pain    Diabetes mellitus without complication (HCC)    Diabetes type 2, controlled (HCC)     Elevated ferritin level    Fatty liver    GERD (gastroesophageal reflux disease)    GERD with stricture    Headache    "1-2/wk" (04/23/2017)   History of blood transfusion "plenty"   "related to anemia" (04/23/2017)   Hypertension    Inguinal hernia    Low back pain    Migraine    "1-2/month" (04/23/2017)   OA (osteoarthritis) of knee--left    Symptomatic anemia 04/23/2017   Vitamin B 12 deficiency    BP 118/75   Pulse (!) 58   Ht 5' 5.5" (1.664 m)   Wt 245 lb (111.1 kg)   LMP 12/03/2012   SpO2 97%   BMI 40.15 kg/m   Opioid Risk Score:   Fall Risk Score:  `1  Depression screen [facility] 2/9     08/15/2022    1:33 PM 03/01/2022   10:42 AM 11/08/2021    2:32 PM 05/03/2021   11:29 AM 03/08/2021   11:28 AM  Depression screen PHQ 2/9  Decreased Interest 2 2 2 1  0  Down, Depressed, Hopeless 1 3 2 1 1   PHQ - 2 Score 3 5 4 2 1   Altered sleeping 3 3   1   Tired, decreased energy 3 3   0  Change in appetite 0 1   0  Feeling bad or failure about yourself  1 3   2   Trouble concentrating 1 3   0  Moving slowly or fidgety/restless 0 1   0  Suicidal thoughts 1 3 3   0  PHQ-9 Score 12 22   4   Difficult doing work/chores  Extremely dIfficult   Not difficult at all      Review of Systems  Musculoskeletal:  Positive for gait problem.       Left knee pain   All other systems reviewed and are negative.     Objective:   Physical Exam  General: No acute distress HEENT: NCAT, EOMI, oral membranes moist Cards: reg rate  Chest: normal effort Abdomen: Soft, NT, ND Skin: dry, intact Extremities: no edema Psych: pleasant and appropriate    Psych: pleasant, MOOD is calm.  Skin: intact Neuro: Patient is alert and oriented with extra time.  Cranial nerve exam is unremarkable.  can be a little distracted. ,improved short term memory   Improved insight. Iproved awareness. Motor 5/5. Sensory normal.  Musculoskeletal: antalgic LLE   Assessment & Plan:  1.  Decline in ADL, mobility , and  cognition secondary to anoxic brain injury -improved awareness.   -I'm ok with her driving -continue exelon 3mg  bid  2. Left knee pain: mgt per ortho             voltaren gel prn.  4. Mood:               -maintain serqouel hs for sleep and mood lability -continue zoloft for mood.   - ongoing engagement of church and friends helps her 5. T2DM per primary?: 6. HTN: 11. SVT/NSVT: On coreg and amiodarone bid for rate control.               -s/p ICD/pacer    Twenty minutes of face to face patient care time were spent during this visit. All questions were encouraged and answered.  Follow up with me in 6 mos .

## 2022-09-12 ENCOUNTER — Other Ambulatory Visit: Payer: Medicaid Other

## 2022-09-12 ENCOUNTER — Inpatient Hospital Stay: Admission: RE | Admit: 2022-09-12 | Payer: Medicaid Other | Source: Ambulatory Visit

## 2022-09-12 ENCOUNTER — Telehealth: Payer: Self-pay | Admitting: Orthopaedic Surgery

## 2022-09-12 NOTE — Telephone Encounter (Signed)
Called pt to set an appt with Dr Roda Shutters to go over her MRI which is scheduled for today 5/29. Pt was upset that she could not find [facility]. I gave her the phone number to call and reschedule Pt wasn't listing to what I was asking her to do. Unsure if pt called MRI facility to reschedule. Asked pt to pull over when safe and call Imaging to ask if she can still come to MRI or did she need to reschedule and to call us back.

## 2022-09-25 ENCOUNTER — Ambulatory Visit: Payer: Medicaid Other

## 2022-09-25 DIAGNOSIS — I469 Cardiac arrest, cause unspecified: Secondary | ICD-10-CM | POA: Diagnosis not present

## 2022-09-25 LAB — CUP PACEART REMOTE DEVICE CHECK
Battery Remaining Percentage: 88 %
Date Time Interrogation Session: 20240609004400
Implantable Lead Connection Status: 753985
Implantable Lead Implant Date: 20230531
Implantable Lead Location: 753862
Implantable Lead Model: 3501
Implantable Lead Serial Number: 235497
Implantable Pulse Generator Implant Date: 20230531
Pulse Gen Serial Number: 177788

## 2022-10-02 ENCOUNTER — Ambulatory Visit
Admission: RE | Admit: 2022-10-02 | Discharge: 2022-10-02 | Disposition: A | Discharge status: Home / Self Care | Payer: Medicaid Other | Source: Ambulatory Visit | Attending: Orthopaedic Surgery | Admitting: Orthopaedic Surgery

## 2022-10-02 DIAGNOSIS — M1712 Unilateral primary osteoarthritis, left knee: Secondary | ICD-10-CM

## 2022-10-02 MED ORDER — IOPAMIDOL (ISOVUE-M 200) INJECTION 41%
35.0000 mL | Freq: Once | INTRAMUSCULAR | Status: AC
  Administered 2022-10-02: 35 mL via INTRA_ARTICULAR

## 2022-10-11 NOTE — Progress Notes (Signed)
Needs f/u thanks.

## 2022-10-17 ENCOUNTER — Ambulatory Visit (INDEPENDENT_AMBULATORY_CARE_PROVIDER_SITE_OTHER): Payer: Medicaid Other | Admitting: Orthopaedic Surgery

## 2022-10-17 ENCOUNTER — Other Ambulatory Visit: Payer: Self-pay

## 2022-10-17 ENCOUNTER — Encounter: Payer: Self-pay | Admitting: Orthopaedic Surgery

## 2022-10-17 DIAGNOSIS — M1712 Unilateral primary osteoarthritis, left knee: Secondary | ICD-10-CM

## 2022-10-17 NOTE — Progress Notes (Signed)
Office Visit Note   Patient: Courtney Davies           Date of Birth: [birth year]           MRN: [id number] Visit Date: 10/17/2022              Requested by: Courtney Kos, MD [address] [place],  [place] [address] PCP: Courtney Heir, NP   Assessment & Plan: Visit Diagnoses:  1. Primary osteoarthritis of left knee     Plan: Patient is a 47 year old female with end-stage bone-on-bone osteoarthritis of the left knee.  CT scan reviewed with the patient today.  Treatment options were explained.  At this point she has found no relief from physical therapy and multiple cortisone injections therefore she has elected to move forward with a left total knee replacement pending cardiac clearance from Dr. Eldridge Davies.  We will recheck A1c and prealbumin.  Impression is severe left knee degenerative joint disease secondary to Osteoarthritis.  Bone on bone joint space narrowing is seen on radiographs with neutral alignment.  At this point, conservative treatments fail to provide any significant relief and the pain is severely affecting ADLs and quality of life.  Based on treatment options, the patient has elected to move forward with a knee replacement.  We have discussed the surgical risks that include but are not limited to infection, DVT, leg length discrepancy, stiffness, numbness, tingling, incomplete relief of pain.  Recovery and prognosis were also reviewed.    Anticoagulants: No antithrombotic Postop anticoagulation: Aspirin 81 mg Diabetic: Yes  Nickel allergy: No Prior DVT/PE: No Tobacco use: No Clearances needed for surgery: Courtney Davies - cardiologist Anticipated discharge dispo: Home   Follow-Up Instructions: No follow-ups on file.   Orders:  No orders of the defined types were placed in this encounter.  No orders of the defined types were placed in this encounter.     Procedures: No procedures performed   Clinical Data: No additional findings.   Subjective: Chief  Complaint  Patient presents with   Left Knee - Follow-up    CT review    HPI Courtney Davies returns today for follow-up on her left knee pain.  She recently had a CT arthrogram of the knee.  This is available for review today. Review of Systems  Constitutional: Negative.   HENT: Negative.    Eyes: Negative.   Respiratory: Negative.    Cardiovascular: Negative.   Endocrine: Negative.   Musculoskeletal: Negative.   Neurological: Negative.   Hematological: Negative.   Psychiatric/Behavioral: Negative.    All other systems reviewed and are negative.    Objective: Vital Signs: LMP 12/03/2012   Physical Exam Vitals and nursing note reviewed.  Constitutional:      Appearance: She is well-developed.  HENT:     Head: Atraumatic.     Nose: Nose normal.  Eyes:     Extraocular Movements: Extraocular movements intact.  Cardiovascular:     Pulses: Normal pulses.  Pulmonary:     Effort: Pulmonary effort is normal.  Abdominal:     Palpations: Abdomen is soft.  Musculoskeletal:     Cervical back: Neck supple.  Skin:    General: Skin is warm.     Capillary Refill: Capillary refill takes less than 2 seconds.  Neurological:     Mental Status: She is alert. Mental status is at baseline.  Psychiatric:        Behavior: Behavior normal.        Thought Content:  Thought content normal.        Judgment: Judgment normal.     Ortho Exam Examination left knee shows joint line tenderness and pain and crepitus throughout range of motion.  Collaterals and cruciates are stable. Specialty Comments:  No specialty comments available.  Imaging: No results found.   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   Cough 04/26/2021   Acute blood loss anemia    Controlled type 2  diabetes mellitus with hyperglycemia (HCC)    Essential hypertension    Anoxic brain injury (HCC) 01/02/2021   Fever 12/20/2020   Abdominal wall cellulitis 12/20/2020   Dysphagia    Goals of care, counseling/discussion    Acute systolic heart failure (HCC)    Encephalopathy acute    Type 1 diabetes mellitus without complication (HCC) 12/02/2020   Hypertension 12/02/2020   GERD (gastroesophageal reflux disease) 12/02/2020   Anemia 12/02/2020   Cardiac arrest (HCC) 12/01/2020   Unilateral primary osteoarthritis, left knee 12/01/2018   Lateral meniscus, posterior horn derangement, left 05/14/2018   Status post arthroscopy of left knee 05/14/2018   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/19/2017   Anemia 04/23/2017   Acute bronchitis 04/23/2017   Chronic back pain 10/03/2015   Symptomatic anemia 04/10/2014   Bilateral edema of lower extremity    Low back pain with left-sided sciatica    Past Medical History:  Diagnosis Date   Anemia 09/2015   Anemia    Anoxic brain injury (HCC)    Arthritis    "knees" (04/23/2017)   Asthma    "teens; went away; came back" (04/23/2017)   B12 deficiency anemia 04/23/2017   Cardiac arrest (HCC)    Chronic bronchitis (HCC)    Chronic low back pain with sciatica    Chronic lower back pain    Diabetes mellitus without complication (HCC)    Diabetes type 2, controlled (HCC)    Elevated ferritin level    Fatty liver    GERD (gastroesophageal reflux disease)    GERD with stricture    Headache    "1-2/wk" (04/23/2017)   History of blood transfusion "plenty"   "related to anemia" (04/23/2017)   Hypertension    Inguinal hernia    Low back pain    Migraine    "1-2/month" (04/23/2017)   OA (osteoarthritis) of knee--left    Symptomatic anemia 04/23/2017   Vitamin B 12 deficiency     Family History  Problem Relation Age of Onset   Stroke Mother    Diabetes Mother    Diabetes Father    Stroke Maternal Grandmother    Diabetes Maternal Grandmother     Anemia Paternal Grandmother    Valvular heart disease Paternal Grandmother    Hypertension Mother    Prostate cancer Maternal Uncle        ? intestinal also    Past Surgical History:  Procedure Laterality Date   ABDOMINAL HYSTERECTOMY  YRS AGO   COMPLETE   CESAREAN SECTION  1998; 2002   ESOPHAGEAL DILATION  02/2020   by Dr Lillia Dallas HERNIA REPAIR Bilateral 1980s    "total of 4 surgeries" (04/23/2017)   IR GASTROSTOMY TUBE MOD SED  12/13/2020   KNEE ARTHROSCOPY WITH MEDIAL MENISECTOMY Left 01/30/2018   Procedure: LEFT KNEE ARTHROSCOPY WITH PARTIAL LATERAL MENISCECTOMY;  Surgeon: Kathryne Hitch, MD;  Location: [facility];  Service: Orthopedics;  Laterality: Left;   KNEE CARTILAGE SURGERY Left    SUBQ ICD IMPLANT N/A 09/13/2021   Procedure: SUBQ ICD IMPLANT;  Surgeon: [name], MD;  [facility]: [facility] INVASIVE CV LAB;  Service: Cardiovascular;  Laterality: N/A;   TUBAL LIGATION  2002   Social History   Occupational History   Occupation: Scientist, product/process development in restaurant  Tobacco Use   Smoking status: Former    Types: Cigarettes   Smokeless tobacco: Never  Vaping Use   Vaping Use: Never used  Substance and Sexual Activity   Alcohol use: Yes    Comment: occ   Drug use: Yes    Types: Marijuana    Comment: sometimes   Sexual activity: Not Currently    Birth control/protection: Surgical

## 2022-10-19 LAB — HEMOGLOBIN A1C
Hgb A1c MFr Bld: 7.3 % of total Hgb — ABNORMAL HIGH (ref <5.7)
Mean Plasma Glucose: 163 mg/dL
eAG (mmol/L): 9 mmol/L

## 2022-10-19 LAB — PREALBUMIN: Prealbumin: 26 mg/dL (ref 17–34)

## 2022-10-23 ENCOUNTER — Ambulatory Visit: Payer: Medicaid Other | Admitting: Orthopaedic Surgery

## 2022-10-23 NOTE — Progress Notes (Signed)
Remote ICD transmission.   

## 2022-11-01 ENCOUNTER — Other Ambulatory Visit: Payer: Self-pay | Admitting: Orthopedic Surgery

## 2022-11-05 ENCOUNTER — Other Ambulatory Visit: Payer: Self-pay | Admitting: Orthopedic Surgery

## 2022-11-05 NOTE — Telephone Encounter (Signed)
Patient medication Amiodarone hasn't been refilled by you yet. Medication pend and sent to PCP [name], NP for approval.

## 2022-11-06 NOTE — Telephone Encounter (Signed)
Noted  

## 2022-11-16 ENCOUNTER — Other Ambulatory Visit: Payer: Self-pay | Admitting: Physical Medicine & Rehabilitation

## 2022-11-16 DIAGNOSIS — G931 Anoxic brain damage, not elsewhere classified: Secondary | ICD-10-CM

## 2022-11-21 ENCOUNTER — Other Ambulatory Visit: Payer: Self-pay | Admitting: Orthopedic Surgery

## 2022-11-22 NOTE — Progress Notes (Addendum)
Electrophysiology Office Note:   Date:  11/23/2022  ID:  Courtney Davies, DOB Aug 18, 1975, MRN [id number]  Primary Cardiologist: Lance Muss, MD Electrophysiologist: Will Jorja Loa, MD      History of Present Illness:   Courtney Davies is a 47 y.o. 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   Seen in EP Clinic 12/2021 by Dr. Elberta Fortis for follow up of S-ICD.  She had been discharged home from rehab 01/2021 and was doing well. There was discussion of possibly taking her off amiodarone if no further AF on her device as she previously had low burden.   Last remote 09/2022 review per Dr. Elberta Fortis showed stable battery and lead parameters.   Since last being seen in our clinic the patient reports she is frustrated with her device as it is painful to wear a bra with and the light on the box at home keeps her up at night.  She denies chest pain, palpitations, dyspnea, PND, orthopnea, nausea, vomiting, dizziness, syncope, edema, weight gain, or early satiety.   Review of systems complete and found to be negative unless listed in HPI.    EP Information / Studies Reviewed:        ICD Interrogation-  reviewed in detail today,  See PACEART report.  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 The patient's score is based upon: CHF History: 0 HTN History: 1 Diabetes History: 1 Stroke History: 0 Vascular Disease History:  0 Age Score: 0 Gender Score: 1             Physical Exam:   VS:  BP 118/70   Pulse 70   Ht 5\' 6"  (1.676 m)   Wt 238 lb 12.8 oz (108.3 kg)   LMP 12/03/2012   SpO2 99%   BMI 38.54 kg/m    Wt Readings from Last 3 Encounters:  11/23/22 238 lb 12.8 oz (108.3 kg)  10/17/22 243 lb (110.2 kg)  09/05/22 245 lb (111.1 kg)     GEN: Well nourished, well developed in no acute distress Neuro/PSY: short term memory issues, tearful at times when talking about arrest, does not remember a lot of details and is repetitive at times NECK: No JVD; No carotid bruits CARDIAC: Regular rate and rhythm, no murmurs, rubs, gallops RESPIRATORY:  Clear to auscultation without rales, wheezing or rhonchi  ABDOMEN: Soft, non-tender, non-distended EXTREMITIES:  No edema; No deformity   ASSESSMENT AND PLAN:    Ventricular Fibrillation / Cardiac Arrest s/p [employer] S-ICD  Chronic Systolic Dysfunction  Arrest in 11/2020, possible respiratory (COVID+) leading to cardiac arrest. MRI without evidence of infiltrative disease. ICD for secondary prevention.  -euvolemic today -Stable on an appropriate medical regimen -Normal ICD function -See Pace Art report -No changes today -information given to patient for [facility] to investigate assistance with undergarment options  -reviewed in detail importance of her S-ICD as protection from subsequent cardiac arrest (pt  asked to have it removed due to undergarment fit issues)  Sub-Clinical AF CHA2DS2-VASc 3/ 3.2% annual risk of stroke  -continue amiodarone with irregular fast rhythm  HTN  -well controlled    Anoxic Brain Injury  -pt tearful during exam talking about her cardiac arrest -support offered   Disposition:   Follow up with Dr. Elberta Fortis in 12 months   Signed, Canary Brim, MSN, APRN, NP-C, AGACNP-BC [facility] - Electrophysiology  11/23/2022, 10:36 AM

## 2022-11-23 ENCOUNTER — Ambulatory Visit: Payer: Medicaid Other | Attending: Student | Admitting: Pulmonary Disease

## 2022-11-23 ENCOUNTER — Encounter: Payer: Self-pay | Admitting: Student

## 2022-11-23 VITALS — BP 118/70 | HR 70 | Ht 66.0 in | Wt 238.8 lb

## 2022-11-23 DIAGNOSIS — I469 Cardiac arrest, cause unspecified: Secondary | ICD-10-CM

## 2022-11-23 DIAGNOSIS — I5022 Chronic systolic (congestive) heart failure: Secondary | ICD-10-CM

## 2022-11-23 DIAGNOSIS — I4901 Ventricular fibrillation: Secondary | ICD-10-CM

## 2022-11-23 DIAGNOSIS — I472 Ventricular tachycardia, unspecified: Secondary | ICD-10-CM

## 2022-11-23 DIAGNOSIS — I480 Paroxysmal atrial fibrillation: Secondary | ICD-10-CM | POA: Diagnosis not present

## 2022-11-23 NOTE — Patient Instructions (Signed)
Medication Instructions:  Your physician recommends that you continue on your current medications as directed. Please refer to the Current Medication list given to you today.  *If you need a refill on your cardiac medications before your next appointment, please call your pharmacy*  Lab Work: None ordered If you have labs (blood work) drawn today and your tests are completely normal, you will receive your results only by: MyChart Message (if you have MyChart) OR A paper copy in the mail If you have any lab test that is abnormal or we need to change your treatment, we will call you to review the results.  Follow-Up: At [facility], you and your health needs are our priority.  As part of our continuing mission to provide you with exceptional heart care, we have created designated Provider Care Teams.  These Care Teams include your primary Cardiologist (physician) and Advanced Practice Providers (APPs -  Physician Assistants and Nurse Practitioners) who all work together to provide you with the care you need, when you need it.  Your next appointment:   1 year(s)  Provider:   Will Camnitz, MD  

## 2022-12-06 ENCOUNTER — Encounter: Payer: Self-pay | Admitting: Orthopedic Surgery

## 2022-12-06 ENCOUNTER — Ambulatory Visit (INDEPENDENT_AMBULATORY_CARE_PROVIDER_SITE_OTHER): Payer: Medicaid Other | Admitting: Orthopedic Surgery

## 2022-12-06 VITALS — BP 120/72 | HR 59 | Temp 96.7°F | Resp 17 | Ht 66.0 in | Wt 242.0 lb

## 2022-12-06 DIAGNOSIS — J4520 Mild intermittent asthma, uncomplicated: Secondary | ICD-10-CM

## 2022-12-06 DIAGNOSIS — G931 Anoxic brain damage, not elsewhere classified: Secondary | ICD-10-CM

## 2022-12-06 DIAGNOSIS — E1165 Type 2 diabetes mellitus with hyperglycemia: Secondary | ICD-10-CM

## 2022-12-06 DIAGNOSIS — I10 Essential (primary) hypertension: Secondary | ICD-10-CM

## 2022-12-06 DIAGNOSIS — F329 Major depressive disorder, single episode, unspecified: Secondary | ICD-10-CM | POA: Diagnosis not present

## 2022-12-06 DIAGNOSIS — Z6841 Body Mass Index (BMI) 40.0 and over, adult: Secondary | ICD-10-CM

## 2022-12-06 DIAGNOSIS — E66813 Obesity, class 3: Secondary | ICD-10-CM

## 2022-12-06 MED ORDER — EMPAGLIFLOZIN 10 MG PO TABS: 10.0000 mg | ORAL_TABLET | Freq: Every morning | ORAL | 1 refills | Status: DC

## 2022-12-06 NOTE — Patient Instructions (Addendum)
Please get flu vaccine before November 1st  Start Trulicity for diabetes and weight loss> if you lose weight> your knee pain will improve  Track calories on " My fitness Pal app" - do it for one week  Weight loss is successful if you eat less then 1500 calories a day  Consider taking voltaren gel for knee pain- need to use 3-4 times daily to help with pain   Consider trying knee brace- purchase at pharmacy

## 2022-12-06 NOTE — Progress Notes (Signed)
Careteam: Patient Care Team: [name], NP as PCP - General (Adult Health Nurse Practitioner) [name], MD as PCP - Cardiology (Cardiology) [name], MD as PCP - Electrophysiology (Cardiology) [place], [place], PA-C [facility])  Seen by: Hazle Nordmann, AGNP-C  PLACE OF SERVICE:  [facility]  Advanced Directive information    Allergies  Allergen Reactions   Latex Hives   Other Other (See Comments), Anaphylaxis, Swelling and Hives    Hair glue causes throat to close and hives "glue" Hair glue causes throat to close    Codeine Nausea And Vomiting    Was on an empty stomach.   Nitrofurantoin Rash    Chief Complaint  Patient presents with   Medical Management of Chronic Issues    6 month follow up.   Immunizations    Discuss the need for Influenza vaccine.      HPI: Patient is a 47 y.o. female seen today for medical management of chronic conditions.   No health concerns today.   Memory has improved since anoxic brain injury. She is now applying for jobs. Mother continues to help her manage medications and doctor appointments.   Followed by Dr. Roda Shutters for chronic left knee pain. She has been told she needs TKA. She Is taking tylenol prn for pain. Admits to marijuana use.   Followed by endocrinology for T2DM. She plans to start Trulicity 08/25. No hypoglycemia. Needs refill on Jardiance.   BMI 39.06. Admits to poor food choices like eating chips. She claims she is not normally hungry. No recently weight loss. She does not track calories.   Education given on flu vaccine.   Review of Systems:  Review of Systems  Constitutional: Negative.   HENT: Negative.    Eyes: Negative.   Respiratory: Negative.    Cardiovascular: Negative.  Negative for chest pain, palpitations and leg swelling.  Gastrointestinal: Negative.   Genitourinary: Negative.   Musculoskeletal:  Positive for joint pain. Negative for falls.  Skin: Negative.    Neurological: Negative.   Endo/Heme/Allergies:  Negative for environmental allergies and polydipsia.  Psychiatric/Behavioral: Negative.      Past Medical History:  Diagnosis Date   Anemia 09/2015   Anoxic brain injury (HCC)    Arthritis    "knees" (04/23/2017)   Asthma    "teens; went away; came back" (04/23/2017)   B12 deficiency anemia 04/23/2017   Cardiac arrest (HCC)    Chronic bronchitis (HCC)    Chronic low back pain with sciatica    Chronic lower back pain    Diabetes mellitus without complication (HCC)    Diabetes type 2, controlled (HCC)    Elevated ferritin level    Fatty liver    GERD (gastroesophageal reflux disease)    GERD with stricture    Headache    "1-2/wk" (04/23/2017)   History of blood transfusion "plenty"   "related to anemia" (04/23/2017)   Hypertension    Inguinal hernia    Low back pain    Migraine    "1-2/month" (04/23/2017)   OA (osteoarthritis) of knee--left    Paroxysmal atrial fibrillation (HCC)    Sub-clinical -seen on device?, elevated CHADSVASC   Symptomatic anemia 04/23/2017   Vitamin B 12 deficiency    Past Surgical History:  Procedure Laterality Date   ABDOMINAL HYSTERECTOMY  YRS AGO   COMPLETE   CESAREAN SECTION  1998; 2002   ESOPHAGEAL DILATION  02/2020   by Dr Lanae Boast   INGUINAL HERNIA REPAIR  Bilateral 1980s    "total of 4 surgeries" (04/23/2017)   IR GASTROSTOMY TUBE MOD SED  12/13/2020   KNEE ARTHROSCOPY WITH MEDIAL MENISECTOMY Left 01/30/2018   Procedure: LEFT KNEE ARTHROSCOPY WITH PARTIAL LATERAL MENISCECTOMY;  Surgeon: Kathryne Hitch, MD;  Location: [facility];  Service: Orthopedics;  Laterality: Left;   KNEE CARTILAGE SURGERY Left    SUBQ ICD IMPLANT N/A 09/13/2021   Procedure: SUBQ ICD IMPLANT;  Surgeon: [name], MD;  Location: [facility] INVASIVE CV LAB;  Service: Cardiovascular;  Laterality: N/A;   TUBAL LIGATION  2002   Social History:   reports that she has quit smoking. Her smoking use included cigarettes. She  has never used smokeless tobacco. She reports current alcohol use. She reports current drug use. Drug: Marijuana.  Family History  Problem Relation Age of Onset   Stroke Mother    Diabetes Mother    Diabetes Father    Stroke Maternal Grandmother    Diabetes Maternal Grandmother    Anemia Paternal Grandmother    Valvular heart disease Paternal Grandmother    Hypertension Mother    Prostate cancer Maternal Uncle        ? intestinal also    Medications: Patient's Medications  New Prescriptions   No medications on file  Previous Medications   ACCU-CHEK GUIDE TEST STRIP       ACETAMINOPHEN (TYLENOL) 500 MG TABLET    Take 1 tablet (500 mg total) by mouth every 6 (six) hours as needed.   ALBUTEROL (VENTOLIN HFA) 108 (90 BASE) MCG/ACT INHALER    Inhale 2 puffs into the lungs every 6 (six) hours as needed for wheezing or shortness of breath.   AMIODARONE (PACERONE) 200 MG TABLET    Take 200 mg by mouth daily.   BD PEN NEEDLE NANO 2ND GEN 32G X 4 MM MISC    3 (three) times daily. as directed   BLOOD GLUCOSE MONITORING SUPPL (ACCU-CHEK GUIDE ME) W/DEVICE KIT    SMARTSIG:Via Meter   CARVEDILOL (COREG) 6.25 MG TABLET    TAKE 1 TABLET BY MOUTH TWICE DAILY   DICLOFENAC SODIUM (VOLTAREN) 1 % GEL    Apply 4 g topically 4 (four) times daily. Apply to left knee   DULAGLUTIDE (TRULICITY) 0.75 MG/0.5ML SOPN    Inject 0.75 mg into the skin once a week.   EMPAGLIFLOZIN (JARDIANCE) 10 MG TABS TABLET    Take 10 mg by mouth every morning.   FLUTICASONE FUROATE-VILANTEROL (BREO ELLIPTA) 200-25 MCG/ACT AEPB    Inhale 1 puff into the lungs daily.   GABAPENTIN (NEURONTIN) 100 MG CAPSULE    Take 1-3 capsules (100-300 mg total) by mouth at bedtime as needed.   HUMALOG KWIKPEN 100 UNIT/ML KWIKPEN    Inject 14-15 Units into the skin as needed (3 times daily for high blood glucose.).   HYDROXYZINE (VISTARIL) 25 MG CAPSULE    Take 1 capsule (25 mg total) by mouth at bedtime as needed.   INSULIN SYRINGE-NEEDLE U-100  (INSULIN SYRINGE .5CC/31GX5/16") 31G X 5/16" 0.5 ML MISC    3 (three) times daily.   KETOCONAZOLE (NIZORAL) 2 % CREAM    Apply 1 Application topically daily.   LANTUS SOLOSTAR 100 UNIT/ML SOLOSTAR PEN    Inject 30 Units into the skin at bedtime.   LINZESS 290 MCG CAPS CAPSULE    Take 290 mcg by mouth daily as needed (Constipation).   LOSARTAN (COZAAR) 25 MG TABLET    TAKE 1 TABLET BY MOUTH DAILY  METFORMIN (FORTAMET) 500 MG (OSM) 24 HR TABLET    Take 1,000 mg by mouth daily with breakfast.   PANTOPRAZOLE (PROTONIX) 40 MG TABLET    Take 1 tablet (40 mg total) by mouth 2 (two) times daily.   QUETIAPINE (SEROQUEL) 25 MG TABLET    TAKE 1 TABLET(25 MG) BY MOUTH AT BEDTIME   RIVASTIGMINE (EXELON) 3 MG CAPSULE    TAKE 1 CAPSULE(3 MG) BY MOUTH TWICE DAILY   SERTRALINE (ZOLOFT) 50 MG TABLET    Take 1 tablet (50 mg total) by mouth daily.   SYMBICORT 160-4.5 MCG/ACT INHALER    Inhale 2 puffs into the lungs in the morning and at bedtime.  Modified Medications   No medications on file  Discontinued Medications   No medications on file    Physical Exam:  Vitals:   12/06/22 1020  BP: 120/72  Pulse: (!) 59  Resp: 17  Temp: (!) 96.7 F (35.9 C)  SpO2: 99%  Weight: 242 lb (109.8 kg)  Height: 5\' 6"  (1.676 m)   Body mass index is 39.06 kg/m. Wt Readings from Last 3 Encounters:  12/06/22 242 lb (109.8 kg)  11/23/22 238 lb 12.8 oz (108.3 kg)  10/17/22 243 lb (110.2 kg)    Physical Exam Vitals reviewed.  Constitutional:      Appearance: She is obese.  HENT:     Head: Normocephalic.  Eyes:     General:        Right eye: No discharge.        Left eye: No discharge.  Cardiovascular:     Rate and Rhythm: Normal rate and regular rhythm.     Pulses: Normal pulses.     Heart sounds: Normal heart sounds.  Pulmonary:     Effort: Pulmonary effort is normal. No respiratory distress.     Breath sounds: Normal breath sounds. No wheezing or rales.  Abdominal:     General: Bowel sounds are normal.      Palpations: Abdomen is soft.  Musculoskeletal:     Cervical back: Neck supple.     Right lower leg: No edema.     Left lower leg: No edema.  Skin:    General: Skin is warm.     Capillary Refill: Capillary refill takes less than 2 seconds.  Neurological:     General: No focal deficit present.     Mental Status: She is alert and oriented to person, place, and time.  Psychiatric:        Mood and Affect: Mood normal.        Behavior: Behavior normal.     Labs reviewed: Basic Metabolic Panel: Recent Labs    02/08/22 1104  NA 136  K 4.0  CL 103  CO2 22  GLUCOSE 134*  BUN 15  CREATININE 0.94  CALCIUM 9.2   Liver Function Tests: Recent Labs    02/08/22 1104  AST 13  ALT 11  BILITOT 0.4  PROT 7.3   No results for input(s): "LIPASE", "AMYLASE" in the last 8760 hours. No results for input(s): "AMMONIA" in the last 8760 hours. CBC: Recent Labs    02/08/22 1104  WBC 5.5  NEUTROABS 2,728  HGB 12.8  HCT 41.1  MCV 73.4*  PLT 262   Lipid Panel: No results for input(s): "CHOL", "HDL", "LDLCALC", "TRIG", "CHOLHDL", "LDLDIRECT" in the last 8760 hours. TSH: No results for input(s): "TSH" in the last 8760 hours. A1C: Lab Results  Component Value Date   HGBA1C 7.3 (H)  10/17/2022     Assessment/Plan 1. Essential hypertension - controlled - cont coreg - CBC with Differential/Platelet - Complete Metabolic Panel with eGFR  2. Controlled type 2 diabetes mellitus with hyperglycemia, without long-term current use of insulin (HCC) - followed by endocrinology  -  A1c 7.3 10/17/2022 - no hypoglycemias - Diabetic eye exam 05/23/2022  - cont Jardiance, SSI and Trulicity - empagliflozin (JARDIANCE) 10 MG TABS tablet; Take 1 tablet (10 mg total) by mouth every morning.  Dispense: 90 tablet; Refill: 1  3. Reactive depression - improved mood - cont Zoloft  4. Mild intermittent asthma without complication - no recent exacerbations - cont albuterol and Symbicort  5.  Anoxic brain injury (HCC) - followed by Dr. Hermelinda Medicus - improved mobility and short term memory - MMSE- future  6. Class 3 severe obesity due to excess calories with serious comorbidity and body mass index (BMI) of 40.0 to 44.9 in adult (HCC) - BMI 39.06 - discussed tracking calories with " my fitness pal" app - recommend < 1500 calories daily - exercise limited due to left knee pain - about to start Trulicity for T2DM - TSH  Total time: 36 minutes. Greater than 50% of total time spent doing patient education regarding health maintenance, T2DM, obesity, HTN and asthma including symptom/medication management.    Next appt: Visit date not found  Rajesh Wyss Scherry Ran  [facility] [phone]

## 2022-12-07 LAB — CBC WITH DIFFERENTIAL/PLATELET
Absolute Monocytes: 401 cells/uL (ref 200–950)
Basophils Absolute: 30 {cells}/uL (ref 0–200)
Basophils Relative: 0.5 %
Eosinophils Absolute: 153 {cells}/uL (ref 15–500)
Eosinophils Relative: 2.6 %
HCT: 44.4 % (ref 35.0–45.0)
Hemoglobin: 13.5 g/dL (ref 11.7–15.5)
Lymphs Abs: 2561 {cells}/uL (ref 850–3900)
MCH: 22.3 pg — ABNORMAL LOW (ref 27.0–33.0)
MCHC: 30.4 g/dL — ABNORMAL LOW (ref 32.0–36.0)
MCV: 73.3 fL — ABNORMAL LOW (ref 80.0–100.0)
MPV: 11 fL (ref 7.5–12.5)
Monocytes Relative: 6.8 %
Neutro Abs: 2755 {cells}/uL (ref 1500–7800)
Neutrophils Relative %: 46.7 %
Platelets: 263 10*3/uL (ref 140–400)
RBC: 6.06 10*6/uL — ABNORMAL HIGH (ref 3.80–5.10)
RDW: 14.3 % (ref 11.0–15.0)
Total Lymphocyte: 43.4 %
WBC: 5.9 10*3/uL (ref 3.8–10.8)

## 2022-12-07 LAB — TSH: TSH: 0.44 m[IU]/L

## 2022-12-07 LAB — COMPLETE METABOLIC PANEL WITH GFR
AG Ratio: 1.3 (calc) (ref 1.0–2.5)
ALT: 10 U/L (ref 6–29)
AST: 14 U/L (ref 10–35)
Albumin: 4 g/dL (ref 3.6–5.1)
Alkaline phosphatase (APISO): 119 U/L (ref 31–125)
BUN: 11 mg/dL (ref 7–25)
CO2: 21 mmol/L (ref 20–32)
Calcium: 9.1 mg/dL (ref 8.6–10.2)
Chloride: 104 mmol/L (ref 98–110)
Creat: 0.91 mg/dL (ref 0.50–0.99)
Globulin: 3.1 g/dL (calc) (ref 1.9–3.7)
Glucose, Bld: 127 mg/dL (ref 65–139)
Potassium: 4.1 mmol/L (ref 3.5–5.3)
Sodium: 136 mmol/L (ref 135–146)
Total Bilirubin: 0.2 mg/dL (ref 0.2–1.2)
Total Protein: 7.1 g/dL (ref 6.1–8.1)
eGFR: 79 mL/min/{1.73_m2} (ref >=60)

## 2022-12-20 ENCOUNTER — Other Ambulatory Visit: Payer: Self-pay | Admitting: Physical Medicine & Rehabilitation

## 2022-12-20 DIAGNOSIS — G931 Anoxic brain damage, not elsewhere classified: Secondary | ICD-10-CM

## 2023-01-08 ENCOUNTER — Inpatient Hospital Stay (HOSPITAL_COMMUNITY)
Admission: EM | Admit: 2023-01-08 | Discharge: 2023-01-12 | DRG: 062 | Disposition: A | Discharge status: Home Health | Payer: Medicaid Other | Attending: Neurology | Admitting: Neurology

## 2023-01-08 ENCOUNTER — Emergency Department (HOSPITAL_COMMUNITY): Payer: Medicaid Other

## 2023-01-08 DIAGNOSIS — E785 Hyperlipidemia, unspecified: Secondary | ICD-10-CM | POA: Diagnosis present

## 2023-01-08 DIAGNOSIS — K760 Fatty (change of) liver, not elsewhere classified: Secondary | ICD-10-CM | POA: Diagnosis present

## 2023-01-08 DIAGNOSIS — Z8674 Personal history of sudden cardiac arrest: Secondary | ICD-10-CM | POA: Diagnosis not present

## 2023-01-08 DIAGNOSIS — G931 Anoxic brain damage, not elsewhere classified: Secondary | ICD-10-CM | POA: Diagnosis present

## 2023-01-08 DIAGNOSIS — Z7951 Long term (current) use of inhaled steroids: Secondary | ICD-10-CM | POA: Diagnosis not present

## 2023-01-08 DIAGNOSIS — G43109 Migraine with aura, not intractable, without status migrainosus: Secondary | ICD-10-CM | POA: Diagnosis present

## 2023-01-08 DIAGNOSIS — Z7984 Long term (current) use of oral hypoglycemic drugs: Secondary | ICD-10-CM | POA: Diagnosis not present

## 2023-01-08 DIAGNOSIS — Z23 Encounter for immunization: Secondary | ICD-10-CM | POA: Diagnosis not present

## 2023-01-08 DIAGNOSIS — Z87892 Personal history of anaphylaxis: Secondary | ICD-10-CM

## 2023-01-08 DIAGNOSIS — M1712 Unilateral primary osteoarthritis, left knee: Secondary | ICD-10-CM | POA: Diagnosis present

## 2023-01-08 DIAGNOSIS — Z87891 Personal history of nicotine dependence: Secondary | ICD-10-CM | POA: Diagnosis not present

## 2023-01-08 DIAGNOSIS — Z794 Long term (current) use of insulin: Secondary | ICD-10-CM

## 2023-01-08 DIAGNOSIS — I10 Essential (primary) hypertension: Secondary | ICD-10-CM | POA: Diagnosis present

## 2023-01-08 DIAGNOSIS — G459 Transient cerebral ischemic attack, unspecified: Secondary | ICD-10-CM | POA: Diagnosis present

## 2023-01-08 DIAGNOSIS — Z833 Family history of diabetes mellitus: Secondary | ICD-10-CM

## 2023-01-08 DIAGNOSIS — Z823 Family history of stroke: Secondary | ICD-10-CM

## 2023-01-08 DIAGNOSIS — Z888 Allergy status to other drugs, medicaments and biological substances status: Secondary | ICD-10-CM

## 2023-01-08 DIAGNOSIS — I639 Cerebral infarction, unspecified: Secondary | ICD-10-CM | POA: Diagnosis present

## 2023-01-08 DIAGNOSIS — Z885 Allergy status to narcotic agent status: Secondary | ICD-10-CM

## 2023-01-08 DIAGNOSIS — Z7985 Long-term (current) use of injectable non-insulin antidiabetic drugs: Secondary | ICD-10-CM

## 2023-01-08 DIAGNOSIS — Z79899 Other long term (current) drug therapy: Secondary | ICD-10-CM | POA: Diagnosis not present

## 2023-01-08 DIAGNOSIS — H538 Other visual disturbances: Secondary | ICD-10-CM | POA: Diagnosis present

## 2023-01-08 DIAGNOSIS — F1210 Cannabis abuse, uncomplicated: Secondary | ICD-10-CM | POA: Diagnosis present

## 2023-01-08 DIAGNOSIS — Z6837 Body mass index (BMI) 37.0-37.9, adult: Secondary | ICD-10-CM

## 2023-01-08 DIAGNOSIS — I480 Paroxysmal atrial fibrillation: Secondary | ICD-10-CM | POA: Diagnosis present

## 2023-01-08 DIAGNOSIS — E1165 Type 2 diabetes mellitus with hyperglycemia: Secondary | ICD-10-CM | POA: Diagnosis present

## 2023-01-08 DIAGNOSIS — Z90710 Acquired absence of both cervix and uterus: Secondary | ICD-10-CM

## 2023-01-08 DIAGNOSIS — I6340 Cerebral infarction due to embolism of unspecified cerebral artery: Principal | ICD-10-CM | POA: Diagnosis present

## 2023-01-08 DIAGNOSIS — I6300 Cerebral infarction due to thrombosis of unspecified precerebral artery: Secondary | ICD-10-CM | POA: Diagnosis not present

## 2023-01-08 DIAGNOSIS — Z4502 Encounter for adjustment and management of automatic implantable cardiac defibrillator: Secondary | ICD-10-CM

## 2023-01-08 DIAGNOSIS — J4489 Other specified chronic obstructive pulmonary disease: Secondary | ICD-10-CM | POA: Diagnosis present

## 2023-01-08 DIAGNOSIS — F329 Major depressive disorder, single episode, unspecified: Secondary | ICD-10-CM | POA: Diagnosis present

## 2023-01-08 DIAGNOSIS — R29703 NIHSS score 3: Secondary | ICD-10-CM | POA: Diagnosis present

## 2023-01-08 DIAGNOSIS — F419 Anxiety disorder, unspecified: Secondary | ICD-10-CM | POA: Diagnosis present

## 2023-01-08 DIAGNOSIS — E669 Obesity, unspecified: Secondary | ICD-10-CM | POA: Diagnosis present

## 2023-01-08 DIAGNOSIS — Z91040 Latex allergy status: Secondary | ICD-10-CM

## 2023-01-08 DIAGNOSIS — Z9109 Other allergy status, other than to drugs and biological substances: Secondary | ICD-10-CM

## 2023-01-08 DIAGNOSIS — Z95810 Presence of automatic (implantable) cardiac defibrillator: Secondary | ICD-10-CM

## 2023-01-08 DIAGNOSIS — R2990 Unspecified symptoms and signs involving the nervous system: Secondary | ICD-10-CM | POA: Diagnosis not present

## 2023-01-08 DIAGNOSIS — Z8249 Family history of ischemic heart disease and other diseases of the circulatory system: Secondary | ICD-10-CM

## 2023-01-08 DIAGNOSIS — I6389 Other cerebral infarction: Secondary | ICD-10-CM | POA: Diagnosis not present

## 2023-01-08 DIAGNOSIS — R42 Dizziness and giddiness: Secondary | ICD-10-CM | POA: Diagnosis present

## 2023-01-08 LAB — CBC
HCT: 44.3 % (ref 36.0–46.0)
Hemoglobin: 13.9 g/dL (ref 12.0–15.0)
MCH: 22.8 pg — ABNORMAL LOW (ref 26.0–34.0)
MCHC: 31.4 g/dL (ref 30.0–36.0)
MCV: 72.6 fL — ABNORMAL LOW (ref 80.0–100.0)
Platelets: 227 10*3/uL (ref 150–400)
RBC: 6.1 MIL/uL — ABNORMAL HIGH (ref 3.87–5.11)
RDW: 17.2 % — ABNORMAL HIGH (ref 11.5–15.5)
WBC: 5.7 10*3/uL (ref 4.0–10.5)
nRBC: 0 % (ref 0.0–0.2)

## 2023-01-08 LAB — COMPREHENSIVE METABOLIC PANEL
ALT: 19 U/L (ref 0–44)
AST: 22 U/L (ref 15–41)
Albumin: 3.6 g/dL (ref 3.5–5.0)
Alkaline Phosphatase: 97 U/L (ref 38–126)
Anion gap: 12 (ref 5–15)
BUN: 10 mg/dL (ref 6–20)
CO2: 20 mmol/L — ABNORMAL LOW (ref 22–32)
Calcium: 9 mg/dL (ref 8.9–10.3)
Chloride: 106 mmol/L (ref 98–111)
Creatinine, Ser: 0.76 mg/dL (ref 0.44–1.00)
GFR, Estimated: >60 mL/min (ref >60)
Glucose, Bld: 100 mg/dL — ABNORMAL HIGH (ref 70–99)
Potassium: 3.5 mmol/L (ref 3.5–5.1)
Sodium: 138 mmol/L (ref 135–145)
Total Bilirubin: 0.4 mg/dL (ref 0.3–1.2)
Total Protein: 7 g/dL (ref 6.5–8.1)

## 2023-01-08 LAB — DIFFERENTIAL
Abs Immature Granulocytes: 0.01 10*3/uL (ref 0.00–0.07)
Basophils Absolute: 0 10*3/uL (ref 0.0–0.1)
Basophils Relative: 1 %
Eosinophils Absolute: 0.2 10*3/uL (ref 0.0–0.5)
Eosinophils Relative: 3 %
Immature Granulocytes: 0 %
Lymphocytes Relative: 48 %
Lymphs Abs: 2.8 10*3/uL (ref 0.7–4.0)
Monocytes Absolute: 0.5 10*3/uL (ref 0.1–1.0)
Monocytes Relative: 9 %
Neutro Abs: 2.3 10*3/uL (ref 1.7–7.7)
Neutrophils Relative %: 39 %

## 2023-01-08 LAB — PROTIME-INR
INR: 1.1 (ref 0.8–1.2)
Prothrombin Time: 14.4 seconds (ref 11.4–15.2)

## 2023-01-08 LAB — CBG MONITORING, ED
Glucose-Capillary: 129 mg/dL — ABNORMAL HIGH (ref 70–99)
Glucose-Capillary: 71 mg/dL (ref 70–99)

## 2023-01-08 LAB — GLUCOSE, CAPILLARY
Glucose-Capillary: 71 mg/dL (ref 70–99)
Glucose-Capillary: 105 mg/dL — ABNORMAL HIGH (ref 70–99)
Glucose-Capillary: 99 mg/dL (ref 70–99)

## 2023-01-08 LAB — URINALYSIS, COMPLETE (UACMP) WITH MICROSCOPIC
Bilirubin Urine: NEGATIVE
Glucose, UA: NEGATIVE mg/dL
Ketones, ur: 5 mg/dL — AB
Leukocytes,Ua: NEGATIVE
Nitrite: NEGATIVE
Protein, ur: NEGATIVE mg/dL
Specific Gravity, Urine: 1.027 (ref 1.005–1.030)
pH: 8 (ref 5.0–8.0)

## 2023-01-08 LAB — ETHANOL: Alcohol, Ethyl (B): <10 mg/dL (ref <10)

## 2023-01-08 LAB — APTT: aPTT: 34 seconds (ref 24–36)

## 2023-01-08 LAB — HIV ANTIBODY (ROUTINE TESTING W REFLEX): HIV Screen 4th Generation wRfx: NONREACTIVE

## 2023-01-08 LAB — MRSA NEXT GEN BY PCR, NASAL: MRSA by PCR Next Gen: NOT DETECTED

## 2023-01-08 MED ORDER — IOHEXOL 350 MG/ML SOLN
75.0000 mL | Freq: Once | INTRAVENOUS | Status: AC | PRN
  Administered 2023-01-08: 75 mL via INTRAVENOUS

## 2023-01-08 MED ORDER — OXYCODONE HCL 5 MG PO TABS
5.0000 mg | ORAL_TABLET | Freq: Once | ORAL | Status: AC
  Administered 2023-01-08: 5 mg via ORAL
  Filled 2023-01-08: qty 1

## 2023-01-08 MED ORDER — CLEVIDIPINE BUTYRATE 0.5 MG/ML IV EMUL: 0.0000 mg/h | INTRAVENOUS | Status: DC

## 2023-01-08 MED ORDER — CHLORHEXIDINE GLUCONATE CLOTH 2 % EX PADS
6.0000 | MEDICATED_PAD | Freq: Every day | CUTANEOUS | Status: DC
  Administered 2023-01-08 – 2023-01-12 (×5): 6 via TOPICAL

## 2023-01-08 MED ORDER — SODIUM CHLORIDE 0.9 % IV SOLN: INTRAVENOUS | Status: DC

## 2023-01-08 MED ORDER — SENNOSIDES-DOCUSATE SODIUM 8.6-50 MG PO TABS
1.0000 | ORAL_TABLET | Freq: Every evening | ORAL | Status: DC | PRN
  Administered 2023-01-11: 1 via ORAL
  Filled 2023-01-08: qty 1

## 2023-01-08 MED ORDER — SODIUM CHLORIDE 0.9% FLUSH
3.0000 mL | Freq: Once | INTRAVENOUS | Status: AC
  Administered 2023-01-08: 3 mL via INTRAVENOUS

## 2023-01-08 MED ORDER — TENECTEPLASE FOR STROKE
25.0000 mg | PACK | Freq: Once | INTRAVENOUS | Status: AC
  Administered 2023-01-08: 25 mg via INTRAVENOUS
  Filled 2023-01-08: qty 10

## 2023-01-08 MED ORDER — LORAZEPAM 2 MG/ML IJ SOLN
0.5000 mg | Freq: Once | INTRAMUSCULAR | Status: AC
  Administered 2023-01-08: 0.5 mg via INTRAVENOUS
  Filled 2023-01-08: qty 1

## 2023-01-08 MED ORDER — ACETAMINOPHEN 325 MG PO TABS
650.0000 mg | ORAL_TABLET | ORAL | Status: DC | PRN
  Administered 2023-01-08 – 2023-01-11 (×12): 650 mg via ORAL
  Filled 2023-01-08 (×11): qty 2

## 2023-01-08 MED ORDER — ACETAMINOPHEN 650 MG RE SUPP: 650.0000 mg | RECTAL | Status: DC | PRN

## 2023-01-08 MED ORDER — ACETAMINOPHEN 160 MG/5ML PO SOLN: 650.0000 mg | ORAL | Status: DC | PRN

## 2023-01-08 MED ORDER — LABETALOL HCL 5 MG/ML IV SOLN
10.0000 mg | INTRAVENOUS | Status: DC | PRN
  Administered 2023-01-08: 10 mg via INTRAVENOUS
  Filled 2023-01-08: qty 4

## 2023-01-08 MED ORDER — ORAL CARE MOUTH RINSE: 15.0000 mL | OROMUCOSAL | Status: DC | PRN

## 2023-01-08 MED ORDER — INSULIN ASPART 100 UNIT/ML IJ SOLN
0.0000 [IU] | Freq: Three times a day (TID) | INTRAMUSCULAR | Status: DC
  Administered 2023-01-09 – 2023-01-10 (×2): 2 [IU] via SUBCUTANEOUS

## 2023-01-08 MED ORDER — LABETALOL HCL 5 MG/ML IV SOLN
10.0000 mg | Freq: Once | INTRAVENOUS | Status: AC
  Administered 2023-01-08: 10 mg via INTRAVENOUS

## 2023-01-08 MED ORDER — STROKE: EARLY STAGES OF RECOVERY BOOK
Freq: Once | Status: AC
  Filled 2023-01-08: qty 1

## 2023-01-08 NOTE — H&P (Signed)
Unclear if ICD is MR compatible - will order repeat head CT at 24 hrs for now  Bing Neighbors, MD [facility] [phone]  If 7pm- 7am, please page neurology on call as listed in AMION.  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 Courtney Davies called her mother around 8:17am then EMS once dizziness had not improved. Courtney Davies is not on anticoagulation as an outpatient.   Once in the ED, Courtney Davies experiencing significant amount of anxiety which made exam somewhat difficult. Courtney Davies denies any recent trauma, chest pain, numbness/weakness. Given that Courtney Davies has a defibrillator implant, MRI was not able to be done. CT head was reviewed prior to TNK administration and showed no acute process on attending's review. Despite low NIHSS of 3 that was nonfocal, given severe vertigo there was concern for posterior circulation infarct. Risks, benefits, and alternatives to TNK were discussed with Courtney Davies and she gave informed consent to proceed. TNK administration was delayed 2/2 Courtney Davies consulting with multiple family members by phone prior to deciding to receive TNK.  LNW: 8am  TNK Given: Yes, 10:15am Mechanical Thrombectomy: No LVO on imaging, Courtney Davies in window for TNK  ROS: Full ROS was performed and is negative except as noted in HPI.  Past Medical History:  Diagnosis Date   Anemia 09/2015   Anoxic brain injury (HCC)    Arthritis    "knees" (04/23/2017)   Asthma    "teens; went away; came back"  (04/23/2017)   B12 deficiency anemia 04/23/2017   Cardiac arrest (HCC)    Chronic bronchitis (HCC)    Chronic low back pain with sciatica    Chronic lower back pain    Diabetes mellitus without complication (HCC)    Diabetes type 2, controlled (HCC)    Elevated ferritin level    Fatty liver    GERD (gastroesophageal reflux disease)    GERD with stricture    Headache    "1-2/wk" (04/23/2017)   History of blood transfusion "plenty"   "related to anemia" (04/23/2017)   Hypertension    Inguinal hernia    Low back pain    Migraine    "1-2/month" (04/23/2017)   OA (osteoarthritis) of knee--left    Paroxysmal atrial fibrillation (HCC)    Sub-clinical -seen on device?, elevated CHADSVASC   Symptomatic anemia 04/23/2017   Vitamin B 12 deficiency    family history includes Anemia in her paternal grandmother; Diabetes in her father, maternal grandmother, and mother; Hypertension in her mother; Prostate cancer in her maternal uncle; Stroke in her maternal grandmother and mother; Valvular heart disease in her paternal grandmother.  Social History   Socioeconomic History   Marital status: Single    Spouse name: Not on file   Number of children: 2   Years of education: 11th   Highest education level: Not on file  Occupational History   Occupation: salad maker in restaurant  Tobacco Use   Smoking status: Former    Types: Cigarettes   Smokeless  tobacco: Never  Vaping Use   Vaping status: Never Used  Substance and Sexual Activity   Alcohol use: Yes    Comment: occ   Drug use: Yes    Types: Marijuana    Comment: sometimes   Sexual activity: Not Currently    Birth control/protection: Surgical  Other Topics Concern   Not on file  Social History Narrative   ** Merged History Encounter **       Lives at home with her two children. Occasional caffeine use. Right-handed.   Social Determinants of Health   Financial Resource Strain: Not on file  Food Insecurity: Medium Risk (11/08/2022)    Received from [facility]   Hunger Vital Sign    Worried About Running Out of Food in the Last Year: Sometimes true    Ran Out of Food in the Last Year: Sometimes true  Transportation Needs: Not on file (11/08/2022)  Recent Concern: Transportation Needs - Unmet Transportation Needs (11/08/2022)   Received from [facility]    In the past 12 months, has lack of reliable transportation kept you from medical appointments, meetings, work or from getting things needed for daily living? : Yes  Physical Activity: Not on file  Stress: Not on file  Social Connections: Not on file  Intimate Partner Violence: Not on file   Current Outpatient Medications  Medication Instructions   ACCU-CHEK GUIDE test strip    acetaminophen (TYLENOL) 500 mg, Oral, Every 6 hours PRN   albuterol (VENTOLIN HFA) 108 (90 Base) MCG/ACT inhaler 2 puffs, Inhalation, Every 6 hours PRN   amiodarone (PACERONE) 200 mg, Oral, Daily   BD PEN NEEDLE NANO 2ND GEN 32G X 4 MM MISC 3 times daily, as directed   Blood Glucose Monitoring Suppl (ACCU-CHEK GUIDE ME) w/Device KIT SMARTSIG:Via Meter   carvedilol (COREG) 6.25 mg, Oral, 2 times daily   diclofenac Sodium (VOLTAREN) 4 g, Topical, 4 times daily, Apply to left knee   empagliflozin (JARDIANCE) 10 mg, Oral, Every morning   hydrOXYzine (VISTARIL) 25 mg, Oral, At bedtime PRN   Insulin Syringe-Needle U-100 (INSULIN SYRINGE .5CC/31GX5/16") 31G X 5/16" 0.5 ML MISC 3 times daily   losartan (COZAAR) 25 mg, Oral, Daily   metformin (FORTAMET) 1,000 mg, Oral, Daily with breakfast   metroNIDAZOLE (METROGEL) 0.75 % vaginal gel 1 Applicatorful, Daily   Ozempic (0.25 or 0.5 MG/DOSE) 0.5 mg, Subcutaneous, Weekly   QUEtiapine (SEROQUEL) 25 MG tablet TAKE 1 TABLET(25 MG) BY MOUTH AT BEDTIME   rivastigmine (EXELON) 3 MG capsule TAKE 1 CAPSULE(3 MG) BY MOUTH TWICE DAILY   sertraline (ZOLOFT) 50 mg, Oral, Daily   Allergies  Allergen Reactions   Latex Hives   Other Other  (See Comments), Anaphylaxis, Swelling and Hives    Hair glue causes throat to close and hives "glue" Hair glue causes throat to close    Codeine Nausea And Vomiting    Was on an empty stomach.   Nitrofurantoin Rash   Exam Blood pressure (!) 170/90, pulse (!) 54, temperature 97.8 F (36.6 C), resp. rate 12, height 5\' 6"  (1.676 m), weight 105.8 kg, last menstrual period 12/03/2012, SpO2 100%.  GENERAL: Awake, alert, anxious appearing HEENT: - Normocephalic and atraumatic, dry mm, no LN++, no Thyromegally LUNGS - Clear to auscultation bilaterally with no wheezes CV - S1S2 RRR, no m/r/g, equal pulses bilaterally. ABDOMEN - Soft, nontender, nondistended with normoactive BS Ext: warm, well perfused, intact peripheral pulses, no edema  Neuro: *MS: A&O x4. Follows simple  commands *Speech: No dysarthria, no aphasia *CN:    I: Deferred   II,III: PERRLA, VFF by confrontation, optic discs unable to be visualized 2/2 pupillary constriction   III,IV,VI: EOMI w/o nystagmus, no ptosis   V: Sensation intact from V1 to V3 to LT   VII: Eyelid closure was full.  Face symmetric   VIII: Hearing intact to voice   IX,X: Voice normal, palate elevates symmetrically    XI: SCM/trap 5/5 bilat   XII: Tongue protrudes midline, no atrophy or fasciculations    *Motor: Normal bulk.  No tremor, rigidity or bradykinesia.  *Sensory: Intact to light touch, pinprick, temperature vibration throughout. Symmetric. Propioception intact bilat.  No double-simultaneous extinction.  *Coordination: FNF intact. No limb apraxia.  *Gait: deferred  1a Level of Conscious.: 0? 1b LOC Questions: 1 1c LOC Commands: 0? 2 Best Gaze: 0? 3 Visual: 0? 4 Facial Palsy:  5a Motor Arm - left: 0? 5b Motor Arm - Right: 0 6a Motor Leg - Left: 1? 6b Motor Leg - Right: 1? 7 Limb Ataxia: 0? 8 Sensory: 0? 9 Best Language: 0? 10 Dysarthria: 0 11 Extinct. and Inatten.: 0   TOTAL: 3  Labs:   CMP     Component Value Date/Time    NA 138 01/08/2023 1117   NA 138 03/31/2021 1303   K 3.5 01/08/2023 1117   CL 106 01/08/2023 1117   CO2 20 (L) 01/08/2023 1117   GLUCOSE 100 (H) 01/08/2023 1117   BUN 10 01/08/2023 1117   BUN 12 03/31/2021 1303   CREATININE 0.76 01/08/2023 1117   CREATININE 0.91 12/06/2022 1052   CALCIUM 9.0 01/08/2023 1117   PROT 7.0 01/08/2023 1117   PROT 6.8 12/19/2017 0907   ALBUMIN 3.6 01/08/2023 1117   ALBUMIN 4.0 12/19/2017 0907   AST 22 01/08/2023 1117   ALT 19 01/08/2023 1117   ALKPHOS 97 01/08/2023 1117   BILITOT 0.4 01/08/2023 1117   BILITOT 0.2 12/19/2017 0907   GFR 131.58 05/21/2017 1508   EGFR 79 12/06/2022 1052   EGFR 111 03/31/2021 1303   GFRNONAA >60 01/08/2023 1117   CBC    Component Value Date/Time   WBC 5.7 01/08/2023 0928   RBC 6.10 (H) 01/08/2023 0928   HGB 13.9 01/08/2023 0928   HGB 12.4 03/31/2021 1303   HCT 44.3 01/08/2023 0928   HCT 40.8 03/31/2021 1303   PLT 227 01/08/2023 0928   PLT 248 03/31/2021 1303   MCV 72.6 (L) 01/08/2023 0928   MCV 75 (L) 03/31/2021 1303   MCH 22.8 (L) 01/08/2023 0928   MCHC 31.4 01/08/2023 0928   RDW 17.2 (H) 01/08/2023 0928   RDW 13.3 03/31/2021 1303   LYMPHSABS 2.8 01/08/2023 0928   LYMPHSABS 2.8 12/19/2017 0907   MONOABS 0.5 01/08/2023 0928   EOSABS 0.2 01/08/2023 0928   EOSABS 0.2 12/19/2017 0907   BASOSABS 0.0 01/08/2023 0928   BASOSABS 0.0 12/19/2017 0907   Lipid Panel     Component Value Date/Time   CHOL 163 12/02/2020 0834   TRIG 151 (H) 12/07/2020 0510   HDL 33 (L) 12/02/2020 0834   CHOLHDL 4.9 12/02/2020 0834   VLDL 40 12/02/2020 0834   LDLCALC 90 12/02/2020 0834    Urinalysis    Component Value Date/Time   COLORURINE COLORLESS (A) 01/08/2023 1130   APPEARANCEUR CLEAR 01/08/2023 1130   LABSPEC 1.027 01/08/2023 1130   PHURINE 8.0 01/08/2023 1130   GLUCOSEU NEGATIVE 01/08/2023 1130   HGBUR SMALL (A) 01/08/2023 1130   BILIRUBINUR  NEGATIVE 01/08/2023 1130   KETONESUR 5 (A) 01/08/2023 1130   PROTEINUR  NEGATIVE 01/08/2023 1130   UROBILINOGEN 1.0 02/21/2014 1544   NITRITE NEGATIVE 01/08/2023 1130   LEUKOCYTESUR NEGATIVE 01/08/2023 1130      Imaging Reviewed:  CT HEAD WO CONTRAST 01/08/2023 IMPRESSION: 1. No acute intracranial hemorrhage or evidence of acute large vessel territory infarct. ASPECT score is 10. 2. Possible hyperdense right MCA, though this may be artifactual due to asymmetric streak artifact along the middle cranial fossae. CTA head and neck is recommended for further evaluation.  CT ANGIO HEAD NECK 01/08/2023 IMPRESSION: Negative CTA. No occlusion, stenosis, or irregularity of major arteries in the head and neck.   Assessment:  Courtney Davies is a 47 year old female with a past medical history of anoxic brain injury secondary to cardiac arrest in Vfib with ICD placement, hypertension, and diabetes that presents with acute onset vertigo and blurred vision for approximately 2 hours.  Courtney Davies was given TNK at 10:15am.   Plan:  -Admit to ICU -Neuro checks and NIHSS documentation per post-TNK protocol -STAT head CT for any change in neurological examination -TEE -no aspirin for 24 hours post TNK and until ICH ruled out by repeat head CT - keep SBP less than 180/105 for the 1st 24 hours post TNK to reduce the risk of hemorrhagic transformation - Hold home oral antihypertensives; labetalol IV prn or clevidipine gtt as needed for BP outside of parameters - SCDs for DVT prophylaxis; can start SQ Heparin if head CT 24 hours post TNK is negative for ICH - NPO; swallow study - PT/OT and speech therapy - stroke education - outpatient f/u with neurology after discharge   Note written by Richard Miu PA-S 01/08/2023 and edited by myself to reflect my findings, actions, and recommendations.  I have edited the note above to reflect my full findings and recommendations. I have independently reviewed the chart, obtained history, review of systems and examined the Courtney Davies.I have  personally reviewed pertinent head/neck/spine imaging (CT/MRI). Please feel free to call with any questions.  This Courtney Davies is critically ill and at significant risk of neurological worsening, death and care requires constant monitoring of vital signs, hemodynamics,respiratory and cardiac monitoring, neurological assessment, discussion with family, other specialists and medical decision making of high complexity. I spent 90 minutes of neurocritical care time  in the care of this Courtney Davies. This was time spent independent of any time provided by nurse practitioner or PA.  Bing Neighbors, MD [facility] [phone]  If 7pm- 7am, please page neurology on call as listed in AMION.

## 2023-01-08 NOTE — ED Triage Notes (Addendum)
Pt BIB [facility] from [facility]. Patient was cleaning the bathroom at the dorm. She bend over got dizzy. Patient c/o dizziness, blurry vision and she called her mother at 87. Pt asking her what to do. Patient LNW 0820. EMS was called at [phone]. Patient have blurry vision all over, but can see shadows. EMS reported she does not have no gaze, no nystagmus, no drift and no difficulty with extremities. It was reported by EMS patient for the past week she has been having headache, and dizziness which comes and go and resolve at home. A & O x3. Vital signs: BP 152/102, HR 75, Saturation 98% RA, blood sugar 145.

## 2023-01-08 NOTE — Code Documentation (Signed)
Courtney Davies is a 47 yr old female with a PMH of cardiac arrest, VT with ICD, DM arriving to [facility] via EMS on 01/08/2023. Pt is coming from work where she was LKW at 0820 this morning and now is complaining of dizziness and blurred vision. Pt on no anticoagulant.     Pt met at bridge by stroke team. Airway cleared by EDP, Labs, CBG obtained. Pt to CT with team. NIHSS 3. Pt unable to state month, and has minor drift in both legs. She is dizzy and c/o blurry vision, but able to count fingers in all quadrants. The following imaging was completed: CT, CTA. Per Dr Selina Cooley, CT neg for hemorrhage, and CTA neg for LVO. Pt is candidate for TNK. TNK given at 10:14. Pt in eligible for thrombectomy as no LVO.    Pt will need q 15 min VS and NIHSS for 2 hrs, then q 30 min for 6 hours, then hourly. NPO until swallow screen done, keep BP<180/105. Bedside handoff with Gloriajean Dell RN complete.

## 2023-01-08 NOTE — Plan of Care (Signed)
Problem: Education: Goal: Knowledge of disease or condition will improve Outcome: Progressing Goal: Knowledge of secondary prevention will improve (MUST DOCUMENT ALL) Outcome: Progressing Goal: Knowledge of patient specific risk factors will improve Loraine Leriche N/A or DELETE if not current risk factor) Outcome: Progressing   Problem: Ischemic Stroke/TIA Tissue Perfusion: Goal: Complications of ischemic stroke/TIA will be minimized Outcome: Progressing   Problem: Coping: Goal: Will verbalize positive feelings about self Outcome: Progressing Goal: Will identify appropriate support needs Outcome: Progressing   Problem: Health Behavior/Discharge Planning: Goal: Ability to manage health-related needs will improve Outcome: Progressing Goal: Goals will be collaboratively established with patient/family Outcome: Progressing   Problem: Self-Care: Goal: Ability to participate in self-care as condition permits will improve Outcome: Progressing Goal: Verbalization of feelings and concerns over difficulty with self-care will improve Outcome: Progressing Goal: Ability to communicate needs accurately will improve Outcome: Progressing   Problem: Nutrition: Goal: Risk of aspiration will decrease Outcome: Progressing Goal: Dietary intake will improve Outcome: Progressing   Problem: Education: Goal: Ability to describe self-care measures that may prevent or decrease complications (Diabetes Survival Skills Education) will improve Outcome: Progressing Goal: Individualized Educational Video(s) Outcome: Progressing   Problem: Coping: Goal: Ability to adjust to condition or change in health will improve Outcome: Progressing   Problem: Fluid Volume: Goal: Ability to maintain a balanced intake and output will improve Outcome: Progressing   Problem: Health Behavior/Discharge Planning: Goal: Ability to identify and utilize available resources and services will improve Outcome:  Progressing Goal: Ability to manage health-related needs will improve Outcome: Progressing   Problem: Metabolic: Goal: Ability to maintain appropriate glucose levels will improve Outcome: Progressing   Problem: Nutritional: Goal: Maintenance of adequate nutrition will improve Outcome: Progressing Goal: Progress toward achieving an optimal weight will improve Outcome: Progressing   Problem: Skin Integrity: Goal: Risk for impaired skin integrity will decrease Outcome: Progressing   Problem: Tissue Perfusion: Goal: Adequacy of tissue perfusion will improve Outcome: Progressing

## 2023-01-08 NOTE — ED Provider Notes (Signed)
[place] EMERGENCY DEPARTMENT AT [facility] Provider Note  CSN: [id number] Arrival date & time: 01/08/23 8295  Chief Complaint(s) Code Stroke  HPI Courtney Davies is a 47 y.o. female with history of prior cardiac arrest, VT status post ICD, diabetes presenting with dizziness.  Patient was at work, around 8 AM developed dizziness.  Reports as a spinning sensation.  Reports also blurry vision.  No chest pain, shortness of breath, back pain, abdominal pain.  She reports that she has been having headaches as well.  She was activated as a code stroke prior to hospital arrival by paramedics.   Past Medical History Past Medical History:  Diagnosis Date   Anemia 09/2015   Anoxic brain injury (HCC)    Arthritis    "knees" (04/23/2017)   Asthma    "teens; went away; came back" (04/23/2017)   B12 deficiency anemia 04/23/2017   Cardiac arrest (HCC)    Chronic bronchitis (HCC)    Chronic low back pain with sciatica    Chronic lower back pain    Diabetes mellitus without complication (HCC)    Diabetes type 2, controlled (HCC)    Elevated ferritin level    Fatty liver    GERD (gastroesophageal reflux disease)    GERD with stricture    Headache    "1-2/wk" (04/23/2017)   History of blood transfusion "plenty"   "related to anemia" (04/23/2017)   Hypertension    Inguinal hernia    Low back pain    Migraine    "1-2/month" (04/23/2017)   OA (osteoarthritis) of knee--left    Paroxysmal atrial fibrillation (HCC)    Sub-clinical -seen on device?, elevated CHADSVASC   Symptomatic anemia 04/23/2017   Vitamin B 12 deficiency    Patient Active Problem List   Diagnosis Date Noted   Stroke (HCC) 01/08/2023   Mild intermittent asthma without complication 02/09/2022   Vitamin B12 deficiency 02/09/2022   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   Cough 04/26/2021   Acute blood loss anemia    Controlled type 2 diabetes mellitus with  hyperglycemia (HCC)    Essential hypertension    Anoxic brain injury (HCC) 01/02/2021   Fever 12/20/2020   Abdominal wall cellulitis 12/20/2020   Dysphagia    Goals of care, counseling/discussion    Acute systolic heart failure (HCC)    Encephalopathy acute    Type 1 diabetes mellitus without complication (HCC) 12/02/2020   Hypertension 12/02/2020   GERD (gastroesophageal reflux disease) 12/02/2020   Anemia 12/02/2020   Cardiac arrest (HCC) 12/01/2020   Unilateral primary osteoarthritis, left knee 12/01/2018   Lateral meniscus, posterior horn derangement, left 05/14/2018   Status post arthroscopy of left knee 05/14/2018   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/19/2017   Anemia 04/23/2017   Acute bronchitis 04/23/2017   Chronic back pain 10/03/2015   Symptomatic anemia 04/10/2014   Bilateral edema of lower extremity    Low back pain with left-sided sciatica    Home Medication(s) Prior to Admission medications   Medication Sig Start Date End Date Taking? Authorizing Provider  ACCU-CHEK GUIDE test strip  08/03/21   [provider]  acetaminophen (TYLENOL) 500 MG tablet Take 1 tablet (500 mg total) by mouth every 6 (six) hours as needed. 08/11/22   Redwine, Madison A, PA-C  albuterol (VENTOLIN HFA) 108 (90 Base) MCG/ACT inhaler Inhale 2 puffs into the lungs every 6 (six) hours as needed for wheezing or shortness of breath. 01/24/21  Love, Evlyn Kanner, PA-C  amiodarone (PACERONE) 200 MG tablet Take 200 mg by mouth daily.    [provider]  BD PEN NEEDLE NANO 2ND GEN 32G X 4 MM MISC 3 (three) times daily. as directed 07/11/21   [provider]  Blood Glucose Monitoring Suppl (ACCU-CHEK GUIDE ME) w/Device KIT SMARTSIG:Via Meter 08/02/21   [provider]  carvedilol (COREG) 6.25 MG tablet TAKE 1 TABLET BY MOUTH TWICE DAILY 11/21/22   [name], [name], NP  diclofenac Sodium (VOLTAREN) 1 % GEL Apply 4 g topically 4 (four) times daily. Apply to left knee 08/15/22    [name], Monina C, NP  Dulaglutide (TRULICITY) 0.75 MG/0.5ML SOPN Inject 0.75 mg into the skin once a week.    [provider]  empagliflozin (JARDIANCE) 10 MG TABS tablet Take 1 tablet (10 mg total) by mouth every morning. 12/06/22   [name], [name], NP  fluticasone furoate-vilanterol (BREO ELLIPTA) 200-25 MCG/ACT AEPB Inhale 1 puff into the lungs daily. 09/25/21   Icard, Rachel Bo, DO  gabapentin (NEURONTIN) 100 MG capsule Take 1-3 capsules (100-300 mg total) by mouth at bedtime as needed. 04/06/22   Tarry Kos, MD  HUMALOG KWIKPEN 100 UNIT/ML KwikPen Inject 14-15 Units into the skin as needed (3 times daily for high blood glucose.). 08/02/21   [provider]  hydrOXYzine (VISTARIL) 25 MG capsule Take 1 capsule (25 mg total) by mouth at bedtime as needed. 06/07/22   [name], [name], NP  Insulin Syringe-Needle U-100 (INSULIN SYRINGE .5CC/31GX5/16") 31G X 5/16" 0.5 ML MISC 3 (three) times daily. 07/12/21   [provider]  ketoconazole (NIZORAL) 2 % cream Apply 1 Application topically daily. Patient not taking: Reported on 12/06/2022    [provider]  LANTUS SOLOSTAR 100 UNIT/ML Solostar Pen Inject 30 Units into the skin at bedtime. 08/03/21   [provider]  LINZESS 290 MCG CAPS capsule Take 290 mcg by mouth daily as needed (Constipation). 03/01/21   [provider]  losartan (COZAAR) 25 MG tablet TAKE 1 TABLET BY MOUTH DAILY 11/01/22   [name], [name], NP  metformin (FORTAMET) 500 MG (OSM) 24 hr tablet Take 1,000 mg by mouth daily with breakfast.    [provider]  pantoprazole (PROTONIX) 40 MG tablet Take 1 tablet (40 mg total) by mouth 2 (two) times daily. 01/24/21   Love, Evlyn Kanner, PA-C  QUEtiapine (SEROQUEL) 25 MG tablet TAKE 1 TABLET(25 MG) BY MOUTH AT BEDTIME 07/20/22   [name], MD  rivastigmine (EXELON) 3 MG capsule TAKE 1 CAPSULE(3 MG) BY MOUTH TWICE DAILY 12/21/22   [name], MD  sertraline (ZOLOFT) 50 MG tablet  Take 1 tablet (50 mg total) by mouth daily. 08/16/22   [name], Monina C, NP  SYMBICORT 160-4.5 MCG/ACT inhaler Inhale 2 puffs into the lungs in the morning and at bedtime. 06/23/21   Josephine Igo, DO  Past Surgical History Past Surgical History:  Procedure Laterality Date   ABDOMINAL HYSTERECTOMY  YRS AGO   COMPLETE   CESAREAN SECTION  1998; 2002   ESOPHAGEAL DILATION  02/2020   by Dr Lillia Dallas HERNIA REPAIR Bilateral 1980s    "total of 4 surgeries" (04/23/2017)   IR GASTROSTOMY TUBE MOD SED  12/13/2020   KNEE ARTHROSCOPY WITH MEDIAL MENISECTOMY Left 01/30/2018   Procedure: LEFT KNEE ARTHROSCOPY WITH PARTIAL LATERAL MENISCECTOMY;  Surgeon: Kathryne Hitch, MD;  Location: [facility];  Service: Orthopedics;  Laterality: Left;   KNEE CARTILAGE SURGERY Left    SUBQ ICD IMPLANT N/A 09/13/2021   Procedure: SUBQ ICD IMPLANT;  Surgeon: [name], MD;  Location: [facility] INVASIVE CV LAB;  Service: Cardiovascular;  Laterality: N/A;   TUBAL LIGATION  2002   Family History Family History  Problem Relation Age of Onset   Stroke Mother    Diabetes Mother    Diabetes Father    Stroke Maternal Grandmother    Diabetes Maternal Grandmother    Anemia Paternal Grandmother    Valvular heart disease Paternal Grandmother    Hypertension Mother    Prostate cancer Maternal Uncle        ? intestinal also    Social History Social History   Tobacco Use   Smoking status: Former    Types: Cigarettes   Smokeless tobacco: Never  Vaping Use   Vaping status: Never Used  Substance Use Topics   Alcohol use: Yes    Comment: occ   Drug use: Yes    Types: Marijuana    Comment: sometimes   Allergies Latex, Other, Codeine, and Nitrofurantoin  Review of Systems Review of Systems  Physical Exam Vital Signs  I have reviewed the triage vital signs BP  (!) 154/106 (BP Location: Right Arm)   Pulse (!) 57   Temp 98.1 F (36.7 C) (Oral)   Resp 16   Ht 5\' 6"  (1.676 m)   Wt 105.8 kg   LMP 12/03/2012   SpO2 100%   BMI 37.65 kg/m  Physical Exam Vitals and nursing note reviewed.  Constitutional:      General: She is not in acute distress.    Appearance: She is well-developed.  HENT:     Head: Normocephalic and atraumatic.     Mouth/Throat:     Mouth: Mucous membranes are moist.  Eyes:     Pupils: Pupils are equal, round, and reactive to light.  Cardiovascular:     Rate and Rhythm: Normal rate and regular rhythm.     Heart sounds: No murmur heard. Pulmonary:     Effort: Pulmonary effort is normal. No respiratory distress.     Breath sounds: Normal breath sounds.  Abdominal:     General: Abdomen is flat.     Palpations: Abdomen is soft.     Tenderness: There is no abdominal tenderness.  Musculoskeletal:        General: No tenderness.     Right lower leg: No edema.     Left lower leg: No edema.  Skin:    General: Skin is warm and dry.  Neurological:     General: No focal deficit present.     Mental Status: She is alert. Mental status is at baseline.     Comments: Cranial nerves II through XII intact, strength 5 out of 5 in the bilateral upper and lower extremities, no sensory deficit to light touch, no dysmetria on finger-nose-finger testing  Psychiatric:  Mood and Affect: Mood is anxious.        Behavior: Behavior normal.     ED Results and Treatments Labs (all labs ordered are listed, but only abnormal results are displayed) Labs Reviewed  CBC - Abnormal; Notable for the following components:      Result Value   RBC 6.10 (*)    MCV 72.6 (*)    MCH 22.8 (*)    RDW 17.2 (*)    All other components within normal limits  CBG MONITORING, ED - Abnormal; Notable for the following components:   Glucose-Capillary 129 (*)    All other components within normal limits  DIFFERENTIAL  PROTIME-INR  APTT  HIV ANTIBODY  (ROUTINE TESTING W REFLEX)  URINALYSIS, COMPLETE (UACMP) WITH MICROSCOPIC  COMPREHENSIVE METABOLIC PANEL  ETHANOL  I-STAT CHEM 8, ED                                                                                                                          Radiology CT ANGIO HEAD NECK W WO CM (CODE STROKE)  Result Date: 01/08/2023 CLINICAL DATA:  Neuro deficit with acute stroke suspected EXAM: CT ANGIOGRAPHY HEAD AND NECK WITH AND WITHOUT CONTRAST TECHNIQUE: Multidetector CT imaging of the head and neck was performed using the standard protocol during bolus administration of intravenous contrast. Multiplanar CT image reconstructions and MIPs were obtained to evaluate the vascular anatomy. Carotid stenosis measurements (when applicable) are obtained utilizing NASCET criteria, using the distal internal carotid diameter as the denominator. RADIATION DOSE REDUCTION: This exam was performed according to the departmental dose-optimization program which includes automated exposure control, adjustment of the mA and/or kV according to patient size and/or use of iterative reconstruction technique. CONTRAST:  75mL OMNIPAQUE IOHEXOL 350 MG/ML SOLN COMPARISON:  Head CT from earlier today FINDINGS: CTA NECK FINDINGS Aortic arch: Atheromatous plaque with 3 vessel branching Right carotid system: Mild motion artifact. No stenosis, beading, or irregularity Left carotid system: Mild motion artifact mainly at the distal common carotid. No stenosis, beading, or irregularity Vertebral arteries: No proximal subclavian stenosis. The codominant vertebral arteries are smoothly contoured and widely patent when allowing for mild motion artifact. Skeleton: No acute finding Other neck: No acute finding. Goiter with 13 mm right-sided nodule. No follow-up recommended unless clinically warranted (ref: J Am Coll Radiol. 2015 Feb;12(2): 143-50). Upper chest: Clear apical lungs Review of the MIP images confirms the above findings CTA HEAD  FINDINGS Anterior circulation: No significant stenosis, proximal occlusion, aneurysm, or vascular malformation. Posterior circulation: No significant stenosis, proximal occlusion, aneurysm, or vascular malformation. Venous sinuses: Negative Anatomic variants: None significant Review of the MIP images confirms the above findings IMPRESSION: Negative CTA. No occlusion, stenosis, or irregularity of major arteries in the head and neck. Electronically Signed   By: Tiburcio Pea M.D.   On: 01/08/2023 10:15   CT HEAD CODE STROKE WO CONTRAST  Result Date: 01/08/2023 CLINICAL DATA:  Code stroke. Neuro deficit, acute, stroke suspected. EXAM: CT HEAD  WITHOUT CONTRAST TECHNIQUE: Contiguous axial images were obtained from the base of the skull through the vertex without intravenous contrast. RADIATION DOSE REDUCTION: This exam was performed according to the departmental dose-optimization program which includes automated exposure control, adjustment of the mA and/or kV according to patient size and/or use of iterative reconstruction technique. COMPARISON:  Head CT 06/19/2021. FINDINGS: Brain: No acute intracranial hemorrhage. Gray-white differentiation is preserved. No hydrocephalus or extra-axial collection. No mass effect or midline shift. Vascular: Possible hyperdense right MCA (coronal image 35 series 5), though this may be artifactual due to asymmetric streak artifact along the middle cranial fossae. Skull: No calvarial fracture or suspicious bone lesion. Skull base is unremarkable. Sinuses/Orbits: No acute finding. Other: None. ASPECTS [facility] Stroke Program Early CT Score) - Ganglionic level infarction (caudate, lentiform nuclei, internal capsule, insula, M1-M3 cortex): 7 - Supraganglionic infarction (M4-M6 cortex): 3 Total score (0-10 with 10 being normal): 10 IMPRESSION: 1. No acute intracranial hemorrhage or evidence of acute large vessel territory infarct. ASPECT score is 10. 2. Possible hyperdense right MCA,  though this may be artifactual due to asymmetric streak artifact along the middle cranial fossae. CTA head and neck is recommended for further evaluation. Code stroke imaging results were communicated on 01/08/2023 at 9:48 am to provider Dr. Selina Cooley via secure text paging. Electronically Signed   By: Orvan Falconer M.D.   On: 01/08/2023 09:48    Pertinent labs & imaging results that were available during my care of the patient were reviewed by me and considered in my medical decision making (see MDM for details).  Medications Ordered in ED Medications  sodium chloride flush (NS) 0.9 % injection 3 mL (has no administration in time range)   stroke: early stages of recovery book (has no administration in time range)  0.9 %  sodium chloride infusion (has no administration in time range)  acetaminophen (TYLENOL) tablet 650 mg (has no administration in time range)    Or  acetaminophen (TYLENOL) 160 MG/5ML solution 650 mg (has no administration in time range)    Or  acetaminophen (TYLENOL) suppository 650 mg (has no administration in time range)  senna-docusate (Senokot-S) tablet 1 tablet (has no administration in time range)  iohexol (OMNIPAQUE) 350 MG/ML injection 75 mL (75 mLs Intravenous Contrast Given 01/08/23 1009)  tenecteplase (TNKASE) injection for Stroke 25 mg (25 mg Intravenous Given 01/08/23 1014)  labetalol (NORMODYNE) injection 10 mg (10 mg Intravenous Given 01/08/23 1005)  LORazepam (ATIVAN) injection 0.5 mg (0.5 mg Intravenous Given 01/08/23 1005)                                                                                                                                     Procedures .Critical Care  Performed by: Lonell Grandchild, MD Authorized by: Lonell Grandchild, MD   Critical care provider statement:    Critical care time (minutes):  30   Critical care was necessary to  treat or prevent imminent or life-threatening deterioration of the following conditions:  CNS failure or  compromise   Critical care was time spent personally by me on the following activities:  Development of treatment plan with patient or surrogate, discussions with consultants, evaluation of patient's response to treatment, examination of patient, ordering and review of laboratory studies, ordering and review of radiographic studies, ordering and performing treatments and interventions, pulse oximetry, re-evaluation of patient's condition and review of old charts   Care discussed with: admitting provider     (including critical care time)  Medical Decision Making / ED Course   MDM:  47 year old female presenting to the emergency department as a code stroke.  Patient overall well-appearing, she is very anxious.  No acute neurologic deficit on my exam.  Patient was seen by neurology on arrival to the emergency department and taken for CT scans and CT angiography.  No bleed was identified and no large vessel occlusion identified.  Given severity of her dizziness as well as her visual abnormalities patient was given TNK by neurology.  She will be admitted to the ICU.      Additional history obtained: -Additional history obtained from ems -External records from outside source obtained and reviewed including: Chart review including previous notes, labs, imaging, consultation notes including prior cardiology notes   Lab Tests: -I ordered, reviewed, and interpreted labs.   The pertinent results include:   Labs Reviewed  CBC - Abnormal; Notable for the following components:      Result Value   RBC 6.10 (*)    MCV 72.6 (*)    MCH 22.8 (*)    RDW 17.2 (*)    All other components within normal limits  CBG MONITORING, ED - Abnormal; Notable for the following components:   Glucose-Capillary 129 (*)    All other components within normal limits  DIFFERENTIAL  PROTIME-INR  APTT  HIV ANTIBODY (ROUTINE TESTING W REFLEX)  URINALYSIS, COMPLETE (UACMP) WITH MICROSCOPIC  COMPREHENSIVE METABOLIC  PANEL  ETHANOL  I-STAT CHEM 8, ED    Notable for minimal hyperglycemia   EKG   EKG Interpretation Date/Time:  Tuesday January 08 2023 10:08:07 EDT Ventricular Rate:  64 PR Interval:  167 QRS Duration:  76 QT Interval:  449 QTC Calculation: 464 R Axis:   100  Text Interpretation: Sinus rhythm Anterolateral infarct, old Confirmed by Alvino Blood ([id number]) on 01/08/2023 10:20:14 AM         Imaging Studies ordered: I ordered imaging studies including CT head On my interpretation imaging demonstrates no ICH I independently visualized and interpreted imaging. I agree with the radiologist interpretation   Medicines ordered and prescription drug management: Meds ordered this encounter  Medications   sodium chloride flush (NS) 0.9 % injection 3 mL   iohexol (OMNIPAQUE) 350 MG/ML injection 75 mL   tenecteplase (TNKASE) injection for Stroke 25 mg   labetalol (NORMODYNE) injection 10 mg   LORazepam (ATIVAN) injection 0.5 mg    stroke: early stages of recovery book   0.9 %  sodium chloride infusion   OR Linked Order Group    acetaminophen (TYLENOL) tablet 650 mg    acetaminophen (TYLENOL) 160 MG/5ML solution 650 mg    acetaminophen (TYLENOL) suppository 650 mg   senna-docusate (Senokot-S) tablet 1 tablet    -I have reviewed the patients home medicines and have made adjustments as needed   Consultations Obtained: I requested consultation with the neurologist,  and discussed lab and imaging findings as well as pertinent  plan - they recommend: give TNK   Cardiac Monitoring: The patient was maintained on a cardiac monitor.  I personally viewed and interpreted the cardiac monitored which showed an underlying rhythm of: NSR  Social Determinants of Health:  Diagnosis or treatment significantly limited by social determinants of health: obesity   Reevaluation: After the interventions noted above, I reevaluated the patient and found that their symptoms have improved  Co  morbidities that complicate the patient evaluation  Past Medical History:  Diagnosis Date   Anemia 09/2015   Anoxic brain injury (HCC)    Arthritis    "knees" (04/23/2017)   Asthma    "teens; went away; came back" (04/23/2017)   B12 deficiency anemia 04/23/2017   Cardiac arrest (HCC)    Chronic bronchitis (HCC)    Chronic low back pain with sciatica    Chronic lower back pain    Diabetes mellitus without complication (HCC)    Diabetes type 2, controlled (HCC)    Elevated ferritin level    Fatty liver    GERD (gastroesophageal reflux disease)    GERD with stricture    Headache    "1-2/wk" (04/23/2017)   History of blood transfusion "plenty"   "related to anemia" (04/23/2017)   Hypertension    Inguinal hernia    Low back pain    Migraine    "1-2/month" (04/23/2017)   OA (osteoarthritis) of knee--left    Paroxysmal atrial fibrillation (HCC)    Sub-clinical -seen on device?, elevated CHADSVASC   Symptomatic anemia 04/23/2017   Vitamin B 12 deficiency       Dispostion: Disposition decision including need for hospitalization was considered, and patient admitted to the hospital.    Final Clinical Impression(s) / ED Diagnoses Final diagnoses:  Cerebrovascular accident (CVA), unspecified mechanism (HCC)     This chart was dictated using voice recognition software.  Despite best efforts to proofread,  errors can occur which can change the documentation meaning.    Lonell Grandchild, MD 01/08/23 1032

## 2023-01-08 NOTE — Progress Notes (Signed)
PHARMACIST CODE STROKE RESPONSE  Notified to mix TNK at 10:10 by Dr. Selina Cooley TNK preparation completed at 10:13.   TNK dose = 25 mg IV over 5 seconds.   Issues/delays encountered (if applicable): Patient requested we contact her mother and family prior to providing consent. After extensive discussion with sister and aunt over the phone, patient decided to receive TNK at 10:10.    Wilburn Cornelia, PharmD, BCPS Clinical Pharmacist 01/08/2023 10:21 AM   Please refer to [facility] for pharmacy phone number

## 2023-01-09 ENCOUNTER — Encounter (HOSPITAL_COMMUNITY): Payer: Self-pay | Admitting: Neurology

## 2023-01-09 ENCOUNTER — Inpatient Hospital Stay (HOSPITAL_COMMUNITY): Payer: Medicaid Other

## 2023-01-09 ENCOUNTER — Other Ambulatory Visit: Payer: Self-pay

## 2023-01-09 DIAGNOSIS — I6389 Other cerebral infarction: Secondary | ICD-10-CM | POA: Diagnosis not present

## 2023-01-09 DIAGNOSIS — I6300 Cerebral infarction due to thrombosis of unspecified precerebral artery: Secondary | ICD-10-CM

## 2023-01-09 LAB — LIPID PANEL
Cholesterol: 159 mg/dL (ref 0–200)
HDL: 38 mg/dL — ABNORMAL LOW (ref >40)
LDL Cholesterol: 104 mg/dL — ABNORMAL HIGH (ref 0–99)
Total CHOL/HDL Ratio: 4.2 RATIO
Triglycerides: 85 mg/dL (ref <150)
VLDL: 17 mg/dL (ref 0–40)

## 2023-01-09 LAB — ECHOCARDIOGRAM COMPLETE
AR max vel: 2.95 cm2
AV Area VTI: 2.79 cm2
AV Area mean vel: 2.88 cm2
AV Mean grad: 4 mmHg
AV Peak grad: 7.6 mmHg
Ao pk vel: 1.38 m/s
Area-P 1/2: 2.99 cm2
Height: 66 in
S' Lateral: 3.5 cm
Weight: 3731.95 oz

## 2023-01-09 LAB — RAPID URINE DRUG SCREEN, HOSP PERFORMED
Amphetamines: NOT DETECTED
Barbiturates: NOT DETECTED
Benzodiazepines: NOT DETECTED
Cocaine: NOT DETECTED
Opiates: NOT DETECTED
Tetrahydrocannabinol: POSITIVE — AB

## 2023-01-09 LAB — HEMOGLOBIN A1C
Hgb A1c MFr Bld: 7.1 % — ABNORMAL HIGH (ref 4.8–5.6)
Mean Plasma Glucose: 157.07 mg/dL

## 2023-01-09 LAB — GLUCOSE, CAPILLARY
Glucose-Capillary: 110 mg/dL — ABNORMAL HIGH (ref 70–99)
Glucose-Capillary: 139 mg/dL — ABNORMAL HIGH (ref 70–99)
Glucose-Capillary: 73 mg/dL (ref 70–99)
Glucose-Capillary: 109 mg/dL — ABNORMAL HIGH (ref 70–99)

## 2023-01-09 MED ORDER — QUETIAPINE FUMARATE 25 MG PO TABS
25.0000 mg | ORAL_TABLET | Freq: Every day | ORAL | Status: DC
  Administered 2023-01-09 – 2023-01-11 (×3): 25 mg via ORAL
  Filled 2023-01-09 (×3): qty 1

## 2023-01-09 MED ORDER — SERTRALINE HCL 50 MG PO TABS
50.0000 mg | ORAL_TABLET | Freq: Every day | ORAL | Status: DC
  Administered 2023-01-09 – 2023-01-12 (×4): 50 mg via ORAL
  Filled 2023-01-09 (×4): qty 1

## 2023-01-09 MED ORDER — ATORVASTATIN CALCIUM 40 MG PO TABS
40.0000 mg | ORAL_TABLET | Freq: Every day | ORAL | Status: DC
  Administered 2023-01-09 – 2023-01-12 (×4): 40 mg via ORAL
  Filled 2023-01-09 (×4): qty 1

## 2023-01-09 MED ORDER — CARVEDILOL 6.25 MG PO TABS
6.2500 mg | ORAL_TABLET | Freq: Two times a day (BID) | ORAL | Status: DC
  Administered 2023-01-09 – 2023-01-12 (×7): 6.25 mg via ORAL
  Filled 2023-01-09: qty 1
  Filled 2023-01-09: qty 2
  Filled 2023-01-09 (×3): qty 1
  Filled 2023-01-09: qty 2
  Filled 2023-01-09: qty 1

## 2023-01-09 MED ORDER — PNEUMOCOCCAL 20-VAL CONJ VACC 0.5 ML IM SUSY
0.5000 mL | PREFILLED_SYRINGE | INTRAMUSCULAR | Status: DC
  Filled 2023-01-09: qty 0.5

## 2023-01-09 MED ORDER — RIVASTIGMINE TARTRATE 1.5 MG PO CAPS
3.0000 mg | ORAL_CAPSULE | Freq: Two times a day (BID) | ORAL | Status: DC
  Administered 2023-01-09 – 2023-01-12 (×7): 3 mg via ORAL
  Filled 2023-01-09 (×9): qty 2

## 2023-01-09 MED ORDER — INFLUENZA VIRUS VACC SPLIT PF (FLUZONE) 0.5 ML IM SUSY
0.5000 mL | PREFILLED_SYRINGE | INTRAMUSCULAR | Status: AC
  Administered 2023-01-10: 0.5 mL via INTRAMUSCULAR
  Filled 2023-01-09: qty 0.5

## 2023-01-09 MED ORDER — LOSARTAN POTASSIUM 25 MG PO TABS
25.0000 mg | ORAL_TABLET | Freq: Every day | ORAL | Status: DC
  Administered 2023-01-09 – 2023-01-12 (×4): 25 mg via ORAL
  Filled 2023-01-09 (×4): qty 1

## 2023-01-09 MED ORDER — ASPIRIN 81 MG PO TBEC
81.0000 mg | DELAYED_RELEASE_TABLET | Freq: Every day | ORAL | Status: DC
  Administered 2023-01-09 – 2023-01-12 (×4): 81 mg via ORAL
  Filled 2023-01-09 (×4): qty 1

## 2023-01-09 MED ORDER — AMIODARONE HCL 200 MG PO TABS
200.0000 mg | ORAL_TABLET | Freq: Every day | ORAL | Status: DC
  Administered 2023-01-09 – 2023-01-12 (×4): 200 mg via ORAL
  Filled 2023-01-09 (×4): qty 1

## 2023-01-09 NOTE — Progress Notes (Addendum)
STROKE TEAM PROGRESS NOTE   BRIEF HPI Ms. Courtney Davies is a 47 y.o. female with history of anoxic brain injury after VF arrest in 2022 s/p ICD placement with transient episode of atrial fibrillation during hospitalization without recurrence on amiodarone, essential HTN, and diabetes presenting from work on 9/24 with acute onset of dizziness described as room spinning and blurred vision.  With complaints of severe vertigo and concern for possible posterior circulation infarct, patient was given TNK and admitted for further stroke workup.  SIGNIFICANT HOSPITAL EVENTS 9/24: -CT head no acute intracranial hemorrhage or evidence of acute large vessel territory infarct.  Aspect score is 10.   -CTA head and neck negative CTA.  No occlusion, stenosis, or irregularity of major arteries in the head and neck. - TNK administered due to concern for possible posterior circulation infarct with complaints of severe vertigo. 9/25: - Repeat CT head no evidence of acute intracranial abnormality.  - MRI ordered, delayed due to patient with ICD  INTERIM HISTORY/SUBJECTIVE Patient is awake and alert laying in ICU bed this morning.  Patient's cousin is at bedside.  Patient reports black spots in her left peripheral vision, ongoing left fingers, toes, and effaced numbness.  Patient denies any history of headaches or migraines though she endorses a headache this morning.  Patient previously reported that she was unable to undergo MRI imaging due to her ICD though upon further evaluation, patient's ICD is compatible. Will obtain MRI brain tomorrow.  She is stable to transfer out of ICU to the floor with plan for discharge tomorrow pending MRI imaging and PT/OT evaluation and clearance.  Home meds restarted.  OBJECTIVE CBC    Component Value Date/Time   WBC 5.7 01/08/2023 0928   RBC 6.10 (H) 01/08/2023 0928   HGB 13.9 01/08/2023 0928   HGB 12.4 03/31/2021 1303   HCT 44.3 01/08/2023 0928   HCT 40.8 03/31/2021 1303    PLT 227 01/08/2023 0928   PLT 248 03/31/2021 1303   MCV 72.6 (L) 01/08/2023 0928   MCV 75 (L) 03/31/2021 1303   MCH 22.8 (L) 01/08/2023 0928   MCHC 31.4 01/08/2023 0928   RDW 17.2 (H) 01/08/2023 0928   RDW 13.3 03/31/2021 1303   LYMPHSABS 2.8 01/08/2023 0928   LYMPHSABS 2.8 12/19/2017 0907   MONOABS 0.5 01/08/2023 0928   EOSABS 0.2 01/08/2023 0928   EOSABS 0.2 12/19/2017 0907   BASOSABS 0.0 01/08/2023 0928   BASOSABS 0.0 12/19/2017 0907   BMET    Component Value Date/Time   NA 138 01/08/2023 1117   NA 138 03/31/2021 1303   K 3.5 01/08/2023 1117   CL 106 01/08/2023 1117   CO2 20 (L) 01/08/2023 1117   GLUCOSE 100 (H) 01/08/2023 1117   BUN 10 01/08/2023 1117   BUN 12 03/31/2021 1303   CREATININE 0.76 01/08/2023 1117   CREATININE 0.91 12/06/2022 1052   CALCIUM 9.0 01/08/2023 1117   EGFR 79 12/06/2022 1052   EGFR 111 03/31/2021 1303   GFRNONAA >60 01/08/2023 1117   IMAGING past 24 hours CT ANGIO HEAD NECK W WO CM (CODE STROKE)  Result Date: 01/08/2023 CLINICAL DATA:  Neuro deficit with acute stroke suspected EXAM: CT ANGIOGRAPHY HEAD AND NECK WITH AND WITHOUT CONTRAST TECHNIQUE: Multidetector CT imaging of the head and neck was performed using the standard protocol during bolus administration of intravenous contrast. Multiplanar CT image reconstructions and MIPs were obtained to evaluate the vascular anatomy. Carotid stenosis measurements (when applicable) are obtained utilizing NASCET criteria, using  the distal internal carotid diameter as the denominator. RADIATION DOSE REDUCTION: This exam was performed according to the departmental dose-optimization program which includes automated exposure control, adjustment of the mA and/or kV according to patient size and/or use of iterative reconstruction technique. CONTRAST:  75mL OMNIPAQUE IOHEXOL 350 MG/ML SOLN COMPARISON:  Head CT from earlier today FINDINGS: CTA NECK FINDINGS Aortic arch: Atheromatous plaque with 3 vessel branching  Right carotid system: Mild motion artifact. No stenosis, beading, or irregularity Left carotid system: Mild motion artifact mainly at the distal common carotid. No stenosis, beading, or irregularity Vertebral arteries: No proximal subclavian stenosis. The codominant vertebral arteries are smoothly contoured and widely patent when allowing for mild motion artifact. Skeleton: No acute finding Other neck: No acute finding. Goiter with 13 mm right-sided nodule. No follow-up recommended unless clinically warranted (ref: J Am Coll Radiol. 2015 Feb;12(2): 143-50). Upper chest: Clear apical lungs Review of the MIP images confirms the above findings CTA HEAD FINDINGS Anterior circulation: No significant stenosis, proximal occlusion, aneurysm, or vascular malformation. Posterior circulation: No significant stenosis, proximal occlusion, aneurysm, or vascular malformation. Venous sinuses: Negative Anatomic variants: None significant Review of the MIP images confirms the above findings IMPRESSION: Negative CTA. No occlusion, stenosis, or irregularity of major arteries in the head and neck. Electronically Signed   By: Tiburcio Pea M.D.   On: 01/08/2023 10:15   CT HEAD CODE STROKE WO CONTRAST  Result Date: 01/08/2023 CLINICAL DATA:  Code stroke. Neuro deficit, acute, stroke suspected. EXAM: CT HEAD WITHOUT CONTRAST TECHNIQUE: Contiguous axial images were obtained from the base of the skull through the vertex without intravenous contrast. RADIATION DOSE REDUCTION: This exam was performed according to the departmental dose-optimization program which includes automated exposure control, adjustment of the mA and/or kV according to patient size and/or use of iterative reconstruction technique. COMPARISON:  Head CT 06/19/2021. FINDINGS: Brain: No acute intracranial hemorrhage. Gray-white differentiation is preserved. No hydrocephalus or extra-axial collection. No mass effect or midline shift. Vascular: Possible hyperdense right  MCA (coronal image 35 series 5), though this may be artifactual due to asymmetric streak artifact along the middle cranial fossae. Skull: No calvarial fracture or suspicious bone lesion. Skull base is unremarkable. Sinuses/Orbits: No acute finding. Other: None. ASPECTS [facility] Stroke Program Early CT Score) - Ganglionic level infarction (caudate, lentiform nuclei, internal capsule, insula, M1-M3 cortex): 7 - Supraganglionic infarction (M4-M6 cortex): 3 Total score (0-10 with 10 being normal): 10 IMPRESSION: 1. No acute intracranial hemorrhage or evidence of acute large vessel territory infarct. ASPECT score is 10. 2. Possible hyperdense right MCA, though this may be artifactual due to asymmetric streak artifact along the middle cranial fossae. CTA head and neck is recommended for further evaluation. Code stroke imaging results were communicated on 01/08/2023 at 9:48 am to provider Dr. Selina Cooley via secure text paging. Electronically Signed   By: Orvan Falconer M.D.   On: 01/08/2023 09:48    Vitals:   01/09/23 0500 01/09/23 0600 01/09/23 0700 01/09/23 0800  BP: 134/82 (!) 148/82 (!) 157/75 (!) 134/114  Pulse: (!) 56 60 (!) 58 (!) 59  Resp: 14 12 12 13   Temp:    (!) 97.5 F (36.4 C)  TempSrc:    Oral  SpO2: 95% 95% 97% 100%  Weight:      Height:       PHYSICAL EXAM General:  Alert, well-nourished, well-developed African-American female in no acute distress Psych: Patient is cooperative throughout assessment but does appear anxious.  Examination is intermittently effort dependent.  CV: Regular rate and rhythm on monitor Respiratory:  Regular, unlabored respirations on room air GI: Abdomen soft and nontender  NEURO:  Mental Status: AA&Ox3, patient is able to give clear and coherent history Speech/Language: speech is without dysarthria or aphasia.  Naming, repetition, fluency, and comprehension intact.  Cranial Nerves:  II: PERRL. Visual fields full though patient reports black spots in her left  peripheral visual fields III, IV, VI: EOMI. patient fixates and tracks examiner throughout. V: Patient reports numbness to the left face but splits midline with light touch and vibration via tuning fork VII: Face is symmetrical resting and with movement VIII: Hearing intact to voice. IX, X: Palate elevates symmetrically. Phonation is normal.  MV:HQIONGEX shrug 5/5. XII: Tongue protrudes midline Motor: Left lower extremity appears weaker than the right with a vertical drift.  Left upper extremity is slightly weaker than the right upper extremity. Motor assessment is largely effort dependent with significant functional overlay.  Tone: is normal and bulk is normal Sensation: Patient reports numbness to the left whole face, left upper extremity, and left lower extremity.  With splitting of the midline.  She also splits the forehead to vibration sensation Coordination: No ataxia noted Gait: Deferred  ASSESSMENT/PLAN  Stroke-like symptoms s/p TNK administration. Etiology: Low suspicion for ischemic etiology.  Presentation possibly related to acute episode of vertigo, complicated migraine headache, low suspicion for acute stroke though will obtain MRI brain.  There nonorganic features on exam with functional overlay throughout assessment. CT head 9/24: No acute intracranial hemorrhage or evidence of acute large vessel territory infarct.  Aspects score is 10. CTA head & neck 9/24: Negative CTA.  No occlusion, stenosis, or irregularity of major arteries in the head and neck. Repeat CT head 24 hours post TNK 9/25: No evidence of acute intracranial abnormality.  Minor paranasal sinus disease. MRI ordered/pending 9/26 (ICD compatible) 2D Echo LVEF 60 to 65% LDL 104 HgbA1c 7.3 VTE prophylaxis - SCDs post TNK.  No antithrombotic prior to admission, now on aspirin 81 mg daily  Therapy recommendations: Pending Disposition: Pending  Atrial Fibrillation, transient during admission following V-fib arrest  in September 2022 No recurrence since September 2022 Followed by [facility] health heart care Dr. Elberta Fortis most recent note 12/20/2021 indicated that patient takes 200 mg daily of amiodarone  Less than 1% burden of atrial fibrillation noted on her device during visit 12/20/2021 with plans to discontinue amiodarone if no further episodes in the future CHA2DS2-VASc of 0 documented 12/20/21, not on anticoagulation   Hypertension Home meds: Losartan Stable Normotension  Hyperlipidemia Home meds: None LDL 104, goal < 70 Add atorvastatin 40 mg PO daily   Continue statin at discharge  Diabetes type II Uncontrolled Hyperglycemia Home meds: Metformin, Ozempic Follow up note from 12/06/22 indicates patient is supposed to be taking Jardiance, SSI, and trulicity  HgbA1c 7.3, goal < 7.0 CBGs SSI Recommend close follow-up with PCP for better DM control  Substance Abuse Patient uses marijuana UDS positive for  THC       Ready to quit? N/A  Other Stroke Risk Factors Obesity, Body mass index is 37.65 kg/m., BMI >/= 30 associated with increased stroke risk, recommend weight loss, diet and exercise as appropriate  Family hx stroke (maternal grandmother)  Other Active Problems Reactive depression On Zoloft Anoxic brain injury from V-fib arrest in August 2022 Followed by Dr. Hermelinda Medicus S/p ICD placement Interrogate ICD  Hospital day # 1  Lanae Boast, AGACNP-BC [facility] Pager: [phone] STROKE MD NOTE :  I have personally obtained history,examined this patient, reviewed notes, independently viewed imaging studies, participated in medical decision making and plan of care.ROS completed by me personally and pertinent positives fully documented  I have made any additions or clarifications directly to the above note. Agree with note above.  Patient presents with sudden onset of dizziness and numbness felt to be a posterior circulation infarct and treated with IV TNK.  She still has  subjective left hemibody numbness with nonorganic features.  MRI scan is pending.  Continue close neurological observation and strict blood pressure control as per post TNK protocol.  Mobilize her out of bed as tolerated.  Physical Occupational Therapy consults.  Continue ongoing stroke workup.  Discussed with patient and cousin at the bedside and answered questions.This patient is critically ill and at significant risk of neurological worsening, death and care requires constant monitoring of vital signs, hemodynamics,respiratory and cardiac monitoring, extensive review of multiple databases, frequent neurological assessment, discussion with family, other specialists and medical decision making of high complexity.I have made any additions or clarifications directly to the above note.This critical care time does not reflect procedure time, or teaching time or supervisory time of PA/NP/Med Resident etc but could involve care discussion time.  I spent 30 minutes of neurocritical care time  in the care of  this patient.      Delia Heady, MD Medical Director [facility] Pager: [phone] 01/09/2023 4:02 PM  To contact Stroke Continuity provider, please refer to [email or web address]. After hours, contact General Neurology

## 2023-01-09 NOTE — Evaluation (Signed)
Physical Therapy Evaluation Patient Details Name: Courtney Davies MRN: [id number] DOB: 08/19/1975 Today's Date: 01/09/2023  History of Present Illness  Ms. Courtney Davies is a 47 year old female presented to the ED via EMS for acute onset dizziness and blurred vision that started around 8am today. CT: negative. MRI: not performed due to ICD. TNK: 10:10 AM 9/24.PHMx: anoxic brain injury after Vfib cardiac arrest in 2022 with ICD placement , hypertension, migraines, LBP, asthma, anxiety, COVID and type 2 diabetes.   Clinical Impression  Courtney Davies is 47 y.o. female admitted with above HPI and diagnosis. Patient is currently limited by functional impairments below (see PT problem list). Patient lives alone and is independent at baseline. Pt limited by cognitive impairments and has poor awareness of deficits and safety requiring cues throughout. Pt's strength exam limited by cognition but overall noted to have functional weakness and coordination deficits on Lt LE. Lt LE fatigued with gait and knee buckling as distance progressed requiring increase from min-mod assist. Patient will benefit from continued skilled PT interventions to address impairments and progress independence with mobility. Patient will benefit from intensive inpatient follow up therapy, >3 hours/day. Acute PT will follow and progress as able.         If plan is discharge home, recommend the following: A lot of help with walking and/or transfers;Assistance with cooking/housework;A little help with bathing/dressing/bathroom;Assist for transportation;Help with stairs or ramp for entrance;Supervision due to cognitive status;Direct supervision/assist for medications management   Can travel by private vehicle        Equipment Recommendations None recommended by PT (TBD at next venue)  Recommendations for Other Services  Rehab consult    Functional Status Assessment Patient has had a recent decline in their functional status and  demonstrates the ability to make significant improvements in function in a reasonable and predictable amount of time.     Precautions / Restrictions Precautions Precautions: Fall Precaution Comments: SBP <180 Restrictions Weight Bearing Restrictions: No      Mobility  Bed Mobility Overal bed mobility: Needs Assistance Bed Mobility: Supine to Sit     Supine to sit: Supervision     General bed mobility comments: sup for safety with supine>sit    Transfers Overall transfer level: Needs assistance Equipment used: 2 person hand held assist Transfers: Sit to/from Stand Sit to Stand: Min assist, +2 safety/equipment           General transfer comment: Min assist to steady with stand from EOB. pt able to sequence small side steps laterally Rt/Lt from EOB. pt performed steps to "cupid shuffle". able to kick Rt LE and maintain Lt knee extension in stance, small kick noted with Lt LE.    Ambulation/Gait Ambulation/Gait assistance: Min assist, +2 safety/equipment, +2 physical assistance, Mod assist Gait Distance (Feet): 30 Feet Assistive device: 1 person hand held assist, 2 person hand held assist Gait Pattern/deviations: Step-through pattern, Knees buckling, Decreased stride length, Decreased dorsiflexion - left Gait velocity: decr        Stairs            Wheelchair Mobility     Tilt Bed    Modified Rankin (Stroke Patients Only)       Balance                                             Pertinent Vitals/Pain Pain Assessment  Pain Assessment: No/denies pain    Home Living Family/patient expects to be discharged to:: Private residence Living Arrangements: Children Available Help at Discharge: Available PRN/intermittently Type of Home: House Home Access: Stairs to enter Entrance Stairs-Rails: Doctor, general practice of Steps: 3-4   Home Layout: One level Home Equipment: None Additional Comments: works at [employer]    Prior  Function Prior Level of Function : Independent/Modified Independent;Working/employed;Driving                     Extremity/Trunk Assessment   Upper Extremity Assessment Upper Extremity Assessment: Right hand dominant;Defer to OT evaluation LUE Deficits / Details: reports numbness/tingling in LUE but can feel when touched on hand/arm and can tell you location even when eyes are shut    Lower Extremity Assessment Lower Extremity Assessment: LLE deficits/detail RLE Deficits / Details: WFL RLE Sensation: WNL RLE Coordination: WNL LLE Deficits / Details: pt with fluctuating strength, heel to shin impaired LLE Sensation: decreased proprioception LLE Coordination: decreased gross motor;decreased fine motor    Cervical / Trunk Assessment Cervical / Trunk Assessment: Normal  Communication   Communication Communication: No apparent difficulties  Cognition Arousal: Alert Behavior During Therapy: WFL for tasks assessed/performed Overall Cognitive Status: Impaired/Different from baseline Area of Impairment: Attention, Following commands, Safety/judgement, Awareness, Problem solving                                        General Comments      Exercises     Assessment/Plan    PT Assessment Patient needs continued PT services  PT Problem List Decreased strength;Decreased range of motion;Decreased activity tolerance;Decreased balance;Decreased mobility;Decreased coordination;Decreased cognition;Decreased knowledge of use of DME;Decreased safety awareness;Decreased knowledge of precautions;Impaired sensation       PT Treatment Interventions DME instruction;Gait training;Stair training;Functional mobility training;Therapeutic activities;Therapeutic exercise;Balance training;Neuromuscular re-education;Cognitive remediation;Patient/family education;Wheelchair mobility training;Manual techniques    PT Goals (Current goals can be found in the Care Plan section)  Acute  Rehab PT Goals Patient Stated Goal: go home and keep working PT Goal Formulation: With patient Time For Goal Achievement: 01/23/23 Potential to Achieve Goals: Good    Frequency Min 1X/week     Co-evaluation PT/OT/SLP Co-Evaluation/Treatment: Yes Reason for Co-Treatment: For patient/therapist safety;To address functional/ADL transfers PT goals addressed during session: Mobility/safety with mobility;Balance;Strengthening/ROM OT goals addressed during session: ADL's and self-care;Strengthening/ROM       AM-PAC PT "6 Clicks" Mobility  Outcome Measure Help needed turning from your back to your side while in a flat bed without using bedrails?: None Help needed moving from lying on your back to sitting on the side of a flat bed without using bedrails?: None Help needed moving to and from a bed to a chair (including a wheelchair)?: A Little Help needed standing up from a chair using your arms (e.g., wheelchair or bedside chair)?: A Little Help needed to walk in hospital room?: A Lot Help needed climbing 3-5 steps with a railing? : Total 6 Click Score: 17    End of Session Equipment Utilized During Treatment: Gait belt Activity Tolerance: Patient tolerated treatment well Patient left: in chair;with call bell/phone within reach;with chair alarm set Nurse Communication: Mobility status PT Visit Diagnosis: Muscle weakness (generalized) (M62.81);Difficulty in walking, not elsewhere classified (R26.2);Other abnormalities of gait and mobility (R26.89)    Time: 2951-8841 PT Time Calculation (min) (ACUTE ONLY): 38 min   Charges:   PT Evaluation $  PT Eval Moderate Complexity: 1 Mod PT Treatments $Gait Training: 8-22 mins PT General Charges $$ ACUTE PT VISIT: 1 Visit         Wynn Maudlin, DPT Acute Rehabilitation Services Office [phone]  01/09/23 4:18 PM

## 2023-01-09 NOTE — Progress Notes (Signed)
*  PRELIMINARY RESULTS* Echocardiogram 2D Echocardiogram has been performed.  Courtney Davies 01/09/2023, 2:09 PM

## 2023-01-09 NOTE — TOC CM/SW Note (Signed)
Transition of Care [facility]) - Inpatient Brief Assessment   Patient Details  Name: RAMSEY LEVERINGTON MRN: [id number] Date of Birth: Sep 23, 1975  Transition of Care [facility]) CM/SW Contact:    Glennon Mac, RN Phone Number: 01/09/2023, 4:55 PM   Clinical Narrative: Ms. Zegers is a 47 year old female presented to the ED via EMS for acute onset dizziness and blurred vision that started around 8am today. CT: negative. MRI: not performed due to ICD. TNK: 10:10 AM 9/24    Transition of Care Asessment: Insurance and Status: Insurance coverage has been reviewed Patient has primary care physician: Yes (Dr. Hazle Nordmann) Home environment has been reviewed: Patient lives alone Prior level of function:: Independent Prior/Current Home Services: No current home services SDOH Review: Needs interventions Readmission risk has been reviewed: Yes Transition of care needs: transition of care needs identified, TOC will continue to follow  Await OT evaluation; PT recommending CIR.  TOC will follow progress.   Quintella Baton, RN, BSN  Trauma/Neuro ICU Case Manager [phone]

## 2023-01-09 NOTE — Evaluation (Signed)
Occupational Therapy Evaluation Patient Details Name: Courtney Davies MRN: [id number] DOB: 03-23-1976 Today's Date: 01/09/2023   History of Present Illness Ms. Weitman is a 47 year old female presented to the ED via EMS for acute onset dizziness and blurred vision that started around 8am today. CT: negative. MRI: not performed due to ICD. 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 Currently she is setup/S-min a for basic ADLs and is unsafe to ambulate on her own due to decreased balance and left leg wanting to buckle intermittently, She will continue to benefit from acute OT with follow up from intensive inpatient follow up therapy, >3 hours/day.        If plan is discharge home, recommend the following: A little help with walking and/or transfers;A little help with bathing/dressing/bathroom;Assist for transportation;Help with stairs or ramp for entrance;Assistance with cooking/housework;Direct supervision/assist for financial management;Direct supervision/assist for medications management    Functional Status Assessment  Patient has had a recent decline in their functional status and demonstrates the ability to make significant improvements in function in a reasonable and predictable amount of time.  Equipment Recommendations  BSC/3in1;Tub/shower seat    Recommendations for Other Services Rehab consult     Precautions / Restrictions Precautions Precautions: Fall Precaution Comments: SBP <180 Restrictions Weight Bearing Restrictions: No      Mobility Bed Mobility Overal bed mobility: Needs Assistance Bed Mobility: Supine to Sit     Supine to sit: Supervision     General bed mobility  comments: sup for safety with supine>sit    Transfers Overall transfer level: Needs assistance Equipment used: 2 person hand held assist Transfers: Sit to/from Stand Sit to Stand: Min assist, +2 safety/equipment           General transfer comment: Min assist to steady with stand from EOB. pt able to sequence small side steps laterally Rt/Lt from EOB. pt performed steps to "cupid shuffle". able to kick Rt LE and maintain Lt knee extension in stance, small kick noted with Lt LE.      Balance Overall balance assessment: Needs assistance Sitting-balance support: No upper extremity supported, Feet supported Sitting balance-Leahy Scale: Fair     Standing balance support: Bilateral upper extremity supported Standing balance-Leahy Scale: Poor                             ADL either performed or assessed with clinical judgement   ADL Overall ADL's : Needs assistance/impaired Eating/Feeding: Independent;Sitting   Grooming: Minimal assistance;Sitting   Upper Body Bathing: Minimal assistance;Sitting   Lower Body Bathing: Minimal assistance;Sit to/from stand Lower Body Bathing Details (indicate cue type and reason): but very uncoordinated when trying to reach her left lower leg and foot Upper Body Dressing : Minimal assistance;Sitting   Lower Body Dressing: Minimal assistance;Sit to/from stand Lower Body Dressing Details (indicate cue type and reason): but very uncoordinated when trying to reach her left foot do put sock on (more coordination issues with LLE than LUE with this task) Toilet Transfer: Minimal assistance;Stand-pivot Toilet Transfer Details (indicate cue type and reason): simulated bed>recliner Toileting- Clothing Manipulation and Hygiene: Minimal assistance;Sit to/from stand       Functional  mobility during ADLs: Minimal assistance;Moderate assistance       Vision Baseline Vision/History:  (reports she should wear glasses but cannot afford them right  now) Patient Visual Report: No change from baseline Vision Assessment?: Yes Eye Alignment: Within Functional Limits Visual Fields: No apparent deficits Additional Comments: Pt could not follow directions for tracking consistently (sometimes she said she was moving her eyes and she was not, sometimes she would start but could not follow for the whole testing). She also could not follow for saccades            Pertinent Vitals/Pain Pain Assessment Pain Assessment: No/denies pain     Extremity/Trunk Assessment Upper Extremity Assessment Upper Extremity Assessment: Right hand dominant;LUE deficits/detail LUE Deficits / Details: reports numbness/tingling in LUE but can feel when touched on hand/arm and can tell you location even when eyes are shut           Communication Communication Communication: No apparent difficulties   Cognition Arousal: Alert Behavior During Therapy: WFL for tasks assessed/performed (animated) Overall Cognitive Status: Impaired/Different from baseline Area of Impairment: Attention, Following commands, Safety/judgement, Awareness, Problem solving                   Current Attention Level: Sustained   Following Commands: Follows one step commands inconsistently Safety/Judgement: Decreased awareness of safety, Decreased awareness of deficits Awareness: Intellectual Problem Solving: Difficulty sequencing, Requires verbal cues, Requires tactile cues                  Home Living Family/patient expects to be discharged to:: Private residence Living Arrangements: Children Available Help at Discharge: Available PRN/intermittently Type of Home: House Home Access: Stairs to enter [employer] of Steps: 3-4 Entrance Stairs-Rails: Right;Left Home Layout: One level     Bathroom Shower/Tub: Chief Strategy Officer: Standard     Home Equipment: None   Additional Comments: 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      Prior  Functioning/Environment Prior Level of Function : Independent/Modified Independent;Working/employed;Driving                        OT Problem List: Impaired balance (sitting and/or standing);Decreased activity tolerance;Impaired vision/perception;Decreased coordination;Decreased safety awareness;Impaired sensation;Impaired UE functional use      OT Treatment/Interventions: Self-care/ADL training;Balance training;Patient/family education;Therapeutic exercise;Therapeutic activities    OT Goals(Current goals can be found in the care plan section) Acute Rehab OT Goals Patient Stated Goal: to go home OT Goal Formulation: With patient Time For Goal Achievement: 01/23/23 Potential to Achieve Goals: Good  OT Frequency: Min 1X/week    Co-evaluation PT/OT/SLP Co-Evaluation/Treatment: Yes Reason for Co-Treatment: For patient/therapist safety;To address functional/ADL transfers PT goals addressed during session: Mobility/safety with mobility;Balance;Strengthening/ROM OT goals addressed during session: ADL's and self-care;Strengthening/ROM      AM-PAC OT "6 Clicks" Daily Activity     Outcome Measure Help from another person eating meals?: A Little Help from another person taking care of personal grooming?: A Little Help from another person toileting, which includes using toliet, bedpan, or urinal?: A Lot Help from another person bathing (including washing, rinsing, drying)?: A Lot Help from another person to put on and taking off regular upper body clothing?: A Lot Help from another person to put on and taking off regular lower body clothing?: Total 6 Click Score: 13   End of Session Equipment Utilized During Treatment: Gait belt Nurse Communication: Mobility status  Activity Tolerance: Patient tolerated treatment well Patient left: in chair;with call  bell/phone within reach;with chair alarm set  OT Visit Diagnosis: Unsteadiness on feet (R26.81);Other abnormalities of gait and  mobility (R26.89);Muscle weakness (generalized) (M62.81);Hemiplegia and hemiparesis Hemiplegia - Right/Left: Left Hemiplegia - dominant/non-dominant: Non-Dominant Hemiplegia - caused by:  (CT showing no acute events, MRI: pending)                Time: 1610-9604 OT Time Calculation (min): 38 min Charges:  OT General Charges $OT Visit: 1 Visit OT Evaluation $OT Eval Moderate Complexity: 1 Mod Cathy L. OT Acute Rehabilitation Services Office [phone]    Evette Georges 01/09/2023, 8:31 PM

## 2023-01-10 ENCOUNTER — Inpatient Hospital Stay (HOSPITAL_COMMUNITY): Payer: Medicaid Other

## 2023-01-10 ENCOUNTER — Other Ambulatory Visit (HOSPITAL_COMMUNITY): Payer: Medicaid Other

## 2023-01-10 DIAGNOSIS — I6300 Cerebral infarction due to thrombosis of unspecified precerebral artery: Secondary | ICD-10-CM | POA: Diagnosis not present

## 2023-01-10 LAB — CBC
HCT: 47.5 % — ABNORMAL HIGH (ref 36.0–46.0)
Hemoglobin: 14.9 g/dL (ref 12.0–15.0)
MCH: 22.5 pg — ABNORMAL LOW (ref 26.0–34.0)
MCHC: 31.4 g/dL (ref 30.0–36.0)
MCV: 71.8 fL — ABNORMAL LOW (ref 80.0–100.0)
Platelets: 282 10*3/uL (ref 150–400)
RBC: 6.62 MIL/uL — ABNORMAL HIGH (ref 3.87–5.11)
RDW: 16.6 % — ABNORMAL HIGH (ref 11.5–15.5)
WBC: 5.2 10*3/uL (ref 4.0–10.5)
nRBC: 0 % (ref 0.0–0.2)

## 2023-01-10 LAB — BASIC METABOLIC PANEL
Anion gap: 9 (ref 5–15)
BUN: 13 mg/dL (ref 6–20)
CO2: 19 mmol/L — ABNORMAL LOW (ref 22–32)
Calcium: 9.2 mg/dL (ref 8.9–10.3)
Chloride: 109 mmol/L (ref 98–111)
Creatinine, Ser: 0.83 mg/dL (ref 0.44–1.00)
GFR, Estimated: >60 mL/min (ref >60)
Glucose, Bld: 104 mg/dL — ABNORMAL HIGH (ref 70–99)
Potassium: 4 mmol/L (ref 3.5–5.1)
Sodium: 137 mmol/L (ref 135–145)

## 2023-01-10 LAB — GLUCOSE, CAPILLARY
Glucose-Capillary: 128 mg/dL — ABNORMAL HIGH (ref 70–99)
Glucose-Capillary: 96 mg/dL (ref 70–99)
Glucose-Capillary: 97 mg/dL (ref 70–99)
Glucose-Capillary: 131 mg/dL — ABNORMAL HIGH (ref 70–99)
Glucose-Capillary: 112 mg/dL — ABNORMAL HIGH (ref 70–99)

## 2023-01-10 MED ORDER — OXYCODONE HCL 5 MG PO TABS: 5.0000 mg | ORAL_TABLET | Freq: Once | ORAL | Status: DC

## 2023-01-10 MED ORDER — INSULIN ASPART 100 UNIT/ML IJ SOLN
0.0000 [IU] | Freq: Three times a day (TID) | INTRAMUSCULAR | Status: DC
  Administered 2023-01-12 (×2): 1 [IU] via SUBCUTANEOUS

## 2023-01-10 NOTE — Evaluation (Signed)
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   Cough 04/26/2021   Acute blood loss anemia    Controlled type 2 diabetes mellitus with hyperglycemia (HCC)    Essential hypertension    Anoxic brain injury (HCC) 01/02/2021   Fever 12/20/2020   Abdominal wall cellulitis 12/20/2020   Dysphagia    Goals of care, counseling/discussion    Acute systolic heart failure (HCC)    Encephalopathy acute    Type 1 diabetes mellitus without complication (HCC) 12/02/2020   Hypertension 12/02/2020   GERD (gastroesophageal reflux disease) 12/02/2020   Anemia 12/02/2020   Cardiac arrest (HCC) 12/01/2020   Unilateral primary osteoarthritis, left knee 12/01/2018   Lateral meniscus, posterior horn derangement, left 05/14/2018   Status post arthroscopy of left knee 05/14/2018   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/19/2017   Anemia 04/23/2017   Acute bronchitis 04/23/2017   Chronic back pain 10/03/2015   Symptomatic anemia 04/10/2014   Bilateral edema of lower extremity    Low back pain with left-sided sciatica    Past Medical History:  Past Medical History:  Diagnosis Date   Anemia 09/2015   Anoxic brain injury (HCC)    Arthritis    "knees" (04/23/2017)   Asthma    "teens; went away; came back" (04/23/2017)   B12 deficiency anemia 04/23/2017   Cardiac arrest (HCC)    Chronic bronchitis (HCC)    Chronic low back pain with sciatica    Chronic lower back pain    Diabetes mellitus without complication (HCC)    Diabetes type 2, controlled (HCC)    Elevated  ferritin level    Fatty liver    GERD (gastroesophageal reflux disease)    GERD with stricture    Headache    "1-2/wk" (04/23/2017)   History of blood transfusion "plenty"   "related to anemia" (04/23/2017)   Hypertension    Inguinal hernia    Low back pain    Migraine    "1-2/month" (04/23/2017)   OA (osteoarthritis) of knee--left    Paroxysmal atrial fibrillation (HCC)    Sub-clinical -seen on device?, elevated CHADSVASC   Symptomatic anemia 04/23/2017   Vitamin B 12 deficiency    Past Surgical History:  Past Surgical History:  Procedure Laterality Date   ABDOMINAL HYSTERECTOMY  YRS AGO   COMPLETE   CESAREAN SECTION  1998; 2002   ESOPHAGEAL DILATION  02/2020   by Dr Lanae Boast   INGUINAL HERNIA REPAIR Bilateral 1980s    "total of 4 surgeries" (04/23/2017)   IR GASTROSTOMY TUBE MOD SED  12/13/2020   KNEE ARTHROSCOPY WITH MEDIAL MENISECTOMY Left 01/30/2018   Procedure: LEFT KNEE ARTHROSCOPY WITH PARTIAL LATERAL MENISCECTOMY;  Surgeon: Kathryne Hitch, MD;  Location: [facility];  Service: Orthopedics;  Laterality: Left;   KNEE CARTILAGE SURGERY Left    SUBQ ICD IMPLANT N/A 09/13/2021   Procedure: SUBQ ICD IMPLANT;  Surgeon: [name], MD;  Location: [facility] INVASIVE CV LAB;  Service: Cardiovascular;  Laterality: N/A;   TUBAL LIGATION  2002   HPI:  Courtney Davies is a 47 yo female presenting to ED 9/24 from  home with her sons with acute onset of dizziness and blurred vision. CTH negative. MRI Brain pending. Seen extensively by SLP following ABI for cognitive and swallowing goals. Previously scored 6/30 on the SLUMS. She demonstrated signs of agnosia, word-finding deficits, language of confusion. PMH includes anoxic brain injury after V-fib cardiac arrest (2022) with ICD placement, HTN, T2DM   Assessment / Plan / Recommendation Clinical Impression  Pt reports history of cognitive impairment. She lives with her sons, who handle all finances and her mother provides assistance with  medication management. She previously scored 6/30 on the SLUMS in 2022. Today, she scored 7/30 on the SLUMS (a score of 27 or above is considered WFL) characterized by deficits related to problem solving, divergent naming, memory, attention, and awareness. Pt with phonemic paraphasias during a sentence repetition task. She appears to have awareness of deficits, although lacks awareness of their impact on ADLs. She reports she feels different than baseline, although no family present to confirm. Suspect she may benefit from continued ST targeting deficits listed above before returning home. Will continue to follow.    SLP Assessment  SLP Recommendation/Assessment: Patient needs continued Speech Lanaguage Pathology Services SLP Visit Diagnosis: Aphasia (R47.01);Cognitive communication deficit (R41.841)    Recommendations for follow up therapy are one component of a multi-disciplinary discharge planning process, led by the attending physician.  Recommendations may be updated based on patient status, additional functional criteria and insurance authorization.    Follow Up Recommendations  Acute inpatient rehab (3hours/day)    Assistance Recommended at Discharge  Frequent or constant Supervision/Assistance  Functional Status Assessment Patient has had a recent decline in their functional status and demonstrates the ability to make significant improvements in function in a reasonable and predictable amount of time.  Frequency and Duration min 2x/week  2 weeks      SLP Evaluation Cognition  Overall Cognitive Status: History of cognitive impairments - at baseline Orientation Level: Oriented to person;Oriented to place;Oriented to situation Attention: Sustained Sustained Attention: Impaired Sustained Attention Impairment: Verbal basic Memory: Impaired Memory Impairment: Storage deficit;Retrieval deficit;Decreased short term memory Decreased Short Term Memory: Verbal basic Awareness:  Impaired Awareness Impairment: Emergent impairment Problem Solving: Impaired Problem Solving Impairment: Verbal basic       Comprehension  Auditory Comprehension Overall Auditory Comprehension: Appears within functional limits for tasks assessed    Expression Expression Primary Mode of Expression: Verbal Verbal Expression Overall Verbal Expression: Impaired Initiation: No impairment Repetition: Impaired Level of Impairment: Sentence level Naming: Impairment Confrontation: Within functional limits Divergent: 25-49% accurate   Oral / Motor  Oral Motor/Sensory Function Overall Oral Motor/Sensory Function: Within functional limits Motor Speech Overall Motor Speech: Appears within functional limits for tasks assessed            Gwynneth Aliment, M.A., CF-SLP Speech Language Pathology, Acute Rehabilitation Services  Secure Chat preferred [phone]  01/10/2023, 5:23 PM

## 2023-01-10 NOTE — TOC Initial Note (Signed)
Transition of Care [facility]) - Initial/Assessment Note    Patient Details  Name: Courtney Davies MRN: [id number] Date of Birth: [birth year]  [facility] CM/SW Contact:    Kermit Balo, RN Phone Number: 01/10/2023, 3:48 PM  Clinical Narrative:                 Patient is from home with her 2 sons that work during the daytime.  No DME at home.  Pt manages her own medications and denies any issues.  Pt drives self but she feels her mother can assist. CM has also added medicaid transportation information to her AVS.  Current recommendations are for CIR. Awaiting eval. Pt states she is hoping to go to her mothers either after the hospital or a rehab stay.   SDOH Interventions Today    Flowsheet Row Most Recent Value  SDOH Interventions   Food Insecurity Interventions [id number] Referral, Inpatient TOC  Transportation Interventions Inpatient TOC      TOC following.  Expected Discharge Plan: IP Rehab Facility Barriers to Discharge: Continued Medical Work up   Patient Goals and CMS Choice   CMS Medicare.gov Compare Post Acute Care list provided to:: Patient Choice offered to / list presented to : Patient      Expected Discharge Plan and Services   Discharge Planning Services: CM Consult   Living arrangements for the past 2 months: Single Family Home                                      Prior Living Arrangements/Services Living arrangements for the past 2 months: Single Family Home Lives with:: Adult Children Patient language and need for interpreter reviewed:: Yes Do you feel safe going back to the place where you live?: Yes            Criminal Activity/Legal Involvement Pertinent to Current Situation/Hospitalization: No - Comment as needed  Activities of Daily Living   ADL Screening (condition at time of admission) Does the patient have a NEW difficulty with bathing/dressing/toileting/self-feeding that is expected to last >3 days?: No Does the  patient have a NEW difficulty with getting in/out of bed, walking, or climbing stairs that is expected to last >3 days?: No Does the patient have a NEW difficulty with communication that is expected to last >3 days?: No Is the patient deaf or have difficulty hearing?: No Does the patient have difficulty seeing, even when wearing glasses/contacts?: Yes (blurry) Does the patient have difficulty concentrating, remembering, or making decisions?: Yes  Permission Sought/Granted                  Emotional Assessment Appearance:: Appears stated age Attitude/Demeanor/Rapport: Engaged Affect (typically observed): Accepting Orientation: : Oriented to Self, Oriented to Place, Oriented to  Time, Oriented to Situation   Psych Involvement: No (comment)  Admission diagnosis:  Stroke [facility]) [I63.9] Cerebrovascular accident (CVA), unspecified mechanism (HCC) [I63.9] Patient Active Problem List   Diagnosis Date Noted   Stroke (HCC) 01/08/2023   Mild intermittent asthma without complication 02/09/2022   Vitamin B12 deficiency 02/09/2022   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   Cough 04/26/2021   Acute blood loss anemia    Controlled type 2 diabetes mellitus with hyperglycemia (HCC)    Essential hypertension    Anoxic brain injury (HCC) 01/02/2021   Fever 12/20/2020   Abdominal wall cellulitis 12/20/2020  Dysphagia    Goals of care, counseling/discussion    Acute systolic heart failure (HCC)    Encephalopathy acute    Type 1 diabetes mellitus without complication (HCC) 12/02/2020   Hypertension 12/02/2020   GERD (gastroesophageal reflux disease) 12/02/2020   Anemia 12/02/2020   Cardiac arrest (HCC) 12/01/2020   Unilateral primary osteoarthritis, left knee 12/01/2018   Lateral meniscus, posterior horn derangement, left 05/14/2018   Status post arthroscopy of left knee 05/14/2018   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/19/2017    Anemia 04/23/2017   Acute bronchitis 04/23/2017   Chronic back pain 10/03/2015   Symptomatic anemia 04/10/2014   Bilateral edema of lower extremity    Low back pain with left-sided sciatica    PCP:  [name], NP Pharmacy:   [facility] [phone] - [place], [place] - [address] [place] [address] Phone: [phone] Fax: [phone]  [facility] [address] [place] [address] Phone: [phone] Fax: [phone]  [id number] - [place], [place] - [address] AT [facility] OF [address] [place] [address] Phone: [phone] Fax: [phone]     Social Determinants of Health (SDOH) Social History: SDOH Screenings   Food Insecurity: Food Insecurity Present (01/08/2023)  Housing: Medium Risk (01/08/2023)  Transportation Needs: Unmet Transportation Needs (01/08/2023)  Utilities: Not At Risk (01/08/2023)  Depression (PHQ2-9): Low Risk  (12/06/2022)  Tobacco Use: Medium Risk (01/09/2023)   SDOH Interventions: Food Insecurity Interventions: [id number] Referral, Inpatient TOC Transportation Interventions: Inpatient TOC   Readmission Risk Interventions     No data to display

## 2023-01-10 NOTE — Discharge Summary (Addendum)
1115   RBC 6.62 (H) 01/10/2023 1115   HGB 14.9 01/10/2023 1115   HGB 12.4 03/31/2021 1303   HCT 47.5 (H) 01/10/2023 1115   HCT 40.8 03/31/2021 1303   PLT 282 01/10/2023 1115   PLT 248 03/31/2021 1303   MCV 71.8 (L) 01/10/2023 1115   MCV 75 (L) 03/31/2021 1303   MCH 22.5 (L) 01/10/2023 1115   MCHC 31.4 01/10/2023 1115   RDW 16.6 (H) 01/10/2023 1115   RDW 13.3 03/31/2021 1303   LYMPHSABS 2.8 01/08/2023 0928   LYMPHSABS 2.8 12/19/2017 0907   MONOABS 0.5 01/08/2023 0928   EOSABS 0.2 01/08/2023 0928   EOSABS 0.2 12/19/2017 0907   BASOSABS 0.0 01/08/2023 0928   BASOSABS 0.0 12/19/2017 0907   CMP    Component Value Date/Time   NA  137 01/10/2023 1115   NA 138 03/31/2021 1303   K 4.0 01/10/2023 1115   CL 109 01/10/2023 1115   CO2 19 (L) 01/10/2023 1115   GLUCOSE 104 (H) 01/10/2023 1115   BUN 13 01/10/2023 1115   BUN 12 03/31/2021 1303   CREATININE 0.83 01/10/2023 1115   CREATININE 0.91 12/06/2022 1052   CALCIUM 9.2 01/10/2023 1115   PROT 7.0 01/08/2023 1117   PROT 6.8 12/19/2017 0907   ALBUMIN 3.6 01/08/2023 1117   ALBUMIN 4.0 12/19/2017 0907   AST 22 01/08/2023 1117   ALT 19 01/08/2023 1117   ALKPHOS 97 01/08/2023 1117   BILITOT 0.4 01/08/2023 1117   BILITOT 0.2 12/19/2017 0907   GFRNONAA >60 01/10/2023 1115   GFRAA >60 08/07/2019 1542   COAGS Lab Results  Component Value Date   INR 1.1 01/08/2023   INR 1.1 12/13/2020   INR 1.3 (H) 10/26/2015   Lipid Panel    Component Value Date/Time   CHOL 159 01/09/2023 0454   TRIG 85 01/09/2023 0454   HDL 38 (L) 01/09/2023 0454   CHOLHDL 4.2 01/09/2023 0454   VLDL 17 01/09/2023 0454   LDLCALC 104 (H) 01/09/2023 0454   HgbA1C  Lab Results  Component Value Date   HGBA1C 7.1 (H) 01/09/2023   Urine Drug Screen positive for THC Alcohol Level    Component Value Date/Time   ETH <10 01/08/2023 1130     SIGNIFICANT DIAGNOSTIC STUDIES MR BRAIN WO CONTRAST  Result Date: 01/10/2023 CLINICAL DATA:  Stroke, follow up. EXAM: MRI HEAD WITHOUT CONTRAST TECHNIQUE: Multiplanar, multiecho pulse sequences of the brain and surrounding structures were obtained without intravenous contrast. COMPARISON:  CT head 01/09/2023. FINDINGS: Brain: No acute infarction, hemorrhage, hydrocephalus, extra-axial collection or mass lesion. Vascular: Major arterial flow voids are maintained at the skull base. Skull and upper cervical spine: Normal marrow signal. Sinuses/Orbits: Mostly clear sinuses.  No acute orbital findings. Other: No mastoid effusions. IMPRESSION: No evidence of acute intracranial abnormality. Electronically Signed   By: Feliberto Harts M.D.   On: 01/10/2023 17:56    ECHOCARDIOGRAM COMPLETE  Result Date: 01/09/2023    ECHOCARDIOGRAM REPORT   Patient Name:   Courtney Davies Date of Exam: 01/09/2023 Medical Rec #:  [id number]       Height:       66.0 in Accession #:    [id number]      Weight:       233.2 lb Date of Birth:  Jul 25, 1975       BSA:          2.135 m Patient Age:    47 years        BP:  1115   RBC 6.62 (H) 01/10/2023 1115   HGB 14.9 01/10/2023 1115   HGB 12.4 03/31/2021 1303   HCT 47.5 (H) 01/10/2023 1115   HCT 40.8 03/31/2021 1303   PLT 282 01/10/2023 1115   PLT 248 03/31/2021 1303   MCV 71.8 (L) 01/10/2023 1115   MCV 75 (L) 03/31/2021 1303   MCH 22.5 (L) 01/10/2023 1115   MCHC 31.4 01/10/2023 1115   RDW 16.6 (H) 01/10/2023 1115   RDW 13.3 03/31/2021 1303   LYMPHSABS 2.8 01/08/2023 0928   LYMPHSABS 2.8 12/19/2017 0907   MONOABS 0.5 01/08/2023 0928   EOSABS 0.2 01/08/2023 0928   EOSABS 0.2 12/19/2017 0907   BASOSABS 0.0 01/08/2023 0928   BASOSABS 0.0 12/19/2017 0907   CMP    Component Value Date/Time   NA  137 01/10/2023 1115   NA 138 03/31/2021 1303   K 4.0 01/10/2023 1115   CL 109 01/10/2023 1115   CO2 19 (L) 01/10/2023 1115   GLUCOSE 104 (H) 01/10/2023 1115   BUN 13 01/10/2023 1115   BUN 12 03/31/2021 1303   CREATININE 0.83 01/10/2023 1115   CREATININE 0.91 12/06/2022 1052   CALCIUM 9.2 01/10/2023 1115   PROT 7.0 01/08/2023 1117   PROT 6.8 12/19/2017 0907   ALBUMIN 3.6 01/08/2023 1117   ALBUMIN 4.0 12/19/2017 0907   AST 22 01/08/2023 1117   ALT 19 01/08/2023 1117   ALKPHOS 97 01/08/2023 1117   BILITOT 0.4 01/08/2023 1117   BILITOT 0.2 12/19/2017 0907   GFRNONAA >60 01/10/2023 1115   GFRAA >60 08/07/2019 1542   COAGS Lab Results  Component Value Date   INR 1.1 01/08/2023   INR 1.1 12/13/2020   INR 1.3 (H) 10/26/2015   Lipid Panel    Component Value Date/Time   CHOL 159 01/09/2023 0454   TRIG 85 01/09/2023 0454   HDL 38 (L) 01/09/2023 0454   CHOLHDL 4.2 01/09/2023 0454   VLDL 17 01/09/2023 0454   LDLCALC 104 (H) 01/09/2023 0454   HgbA1C  Lab Results  Component Value Date   HGBA1C 7.1 (H) 01/09/2023   Urine Drug Screen positive for THC Alcohol Level    Component Value Date/Time   ETH <10 01/08/2023 1130     SIGNIFICANT DIAGNOSTIC STUDIES MR BRAIN WO CONTRAST  Result Date: 01/10/2023 CLINICAL DATA:  Stroke, follow up. EXAM: MRI HEAD WITHOUT CONTRAST TECHNIQUE: Multiplanar, multiecho pulse sequences of the brain and surrounding structures were obtained without intravenous contrast. COMPARISON:  CT head 01/09/2023. FINDINGS: Brain: No acute infarction, hemorrhage, hydrocephalus, extra-axial collection or mass lesion. Vascular: Major arterial flow voids are maintained at the skull base. Skull and upper cervical spine: Normal marrow signal. Sinuses/Orbits: Mostly clear sinuses.  No acute orbital findings. Other: No mastoid effusions. IMPRESSION: No evidence of acute intracranial abnormality. Electronically Signed   By: Feliberto Harts M.D.   On: 01/10/2023 17:56    ECHOCARDIOGRAM COMPLETE  Result Date: 01/09/2023    ECHOCARDIOGRAM REPORT   Patient Name:   Courtney Davies Date of Exam: 01/09/2023 Medical Rec #:  [id number]       Height:       66.0 in Accession #:    [id number]      Weight:       233.2 lb Date of Birth:  Jul 25, 1975       BSA:          2.135 m Patient Age:    47 years        BP:  1115   RBC 6.62 (H) 01/10/2023 1115   HGB 14.9 01/10/2023 1115   HGB 12.4 03/31/2021 1303   HCT 47.5 (H) 01/10/2023 1115   HCT 40.8 03/31/2021 1303   PLT 282 01/10/2023 1115   PLT 248 03/31/2021 1303   MCV 71.8 (L) 01/10/2023 1115   MCV 75 (L) 03/31/2021 1303   MCH 22.5 (L) 01/10/2023 1115   MCHC 31.4 01/10/2023 1115   RDW 16.6 (H) 01/10/2023 1115   RDW 13.3 03/31/2021 1303   LYMPHSABS 2.8 01/08/2023 0928   LYMPHSABS 2.8 12/19/2017 0907   MONOABS 0.5 01/08/2023 0928   EOSABS 0.2 01/08/2023 0928   EOSABS 0.2 12/19/2017 0907   BASOSABS 0.0 01/08/2023 0928   BASOSABS 0.0 12/19/2017 0907   CMP    Component Value Date/Time   NA  137 01/10/2023 1115   NA 138 03/31/2021 1303   K 4.0 01/10/2023 1115   CL 109 01/10/2023 1115   CO2 19 (L) 01/10/2023 1115   GLUCOSE 104 (H) 01/10/2023 1115   BUN 13 01/10/2023 1115   BUN 12 03/31/2021 1303   CREATININE 0.83 01/10/2023 1115   CREATININE 0.91 12/06/2022 1052   CALCIUM 9.2 01/10/2023 1115   PROT 7.0 01/08/2023 1117   PROT 6.8 12/19/2017 0907   ALBUMIN 3.6 01/08/2023 1117   ALBUMIN 4.0 12/19/2017 0907   AST 22 01/08/2023 1117   ALT 19 01/08/2023 1117   ALKPHOS 97 01/08/2023 1117   BILITOT 0.4 01/08/2023 1117   BILITOT 0.2 12/19/2017 0907   GFRNONAA >60 01/10/2023 1115   GFRAA >60 08/07/2019 1542   COAGS Lab Results  Component Value Date   INR 1.1 01/08/2023   INR 1.1 12/13/2020   INR 1.3 (H) 10/26/2015   Lipid Panel    Component Value Date/Time   CHOL 159 01/09/2023 0454   TRIG 85 01/09/2023 0454   HDL 38 (L) 01/09/2023 0454   CHOLHDL 4.2 01/09/2023 0454   VLDL 17 01/09/2023 0454   LDLCALC 104 (H) 01/09/2023 0454   HgbA1C  Lab Results  Component Value Date   HGBA1C 7.1 (H) 01/09/2023   Urine Drug Screen positive for THC Alcohol Level    Component Value Date/Time   ETH <10 01/08/2023 1130     SIGNIFICANT DIAGNOSTIC STUDIES MR BRAIN WO CONTRAST  Result Date: 01/10/2023 CLINICAL DATA:  Stroke, follow up. EXAM: MRI HEAD WITHOUT CONTRAST TECHNIQUE: Multiplanar, multiecho pulse sequences of the brain and surrounding structures were obtained without intravenous contrast. COMPARISON:  CT head 01/09/2023. FINDINGS: Brain: No acute infarction, hemorrhage, hydrocephalus, extra-axial collection or mass lesion. Vascular: Major arterial flow voids are maintained at the skull base. Skull and upper cervical spine: Normal marrow signal. Sinuses/Orbits: Mostly clear sinuses.  No acute orbital findings. Other: No mastoid effusions. IMPRESSION: No evidence of acute intracranial abnormality. Electronically Signed   By: Feliberto Harts M.D.   On: 01/10/2023 17:56    ECHOCARDIOGRAM COMPLETE  Result Date: 01/09/2023    ECHOCARDIOGRAM REPORT   Patient Name:   Courtney Davies Date of Exam: 01/09/2023 Medical Rec #:  [id number]       Height:       66.0 in Accession #:    [id number]      Weight:       233.2 lb Date of Birth:  Jul 25, 1975       BSA:          2.135 m Patient Age:    47 years        BP:  1115   RBC 6.62 (H) 01/10/2023 1115   HGB 14.9 01/10/2023 1115   HGB 12.4 03/31/2021 1303   HCT 47.5 (H) 01/10/2023 1115   HCT 40.8 03/31/2021 1303   PLT 282 01/10/2023 1115   PLT 248 03/31/2021 1303   MCV 71.8 (L) 01/10/2023 1115   MCV 75 (L) 03/31/2021 1303   MCH 22.5 (L) 01/10/2023 1115   MCHC 31.4 01/10/2023 1115   RDW 16.6 (H) 01/10/2023 1115   RDW 13.3 03/31/2021 1303   LYMPHSABS 2.8 01/08/2023 0928   LYMPHSABS 2.8 12/19/2017 0907   MONOABS 0.5 01/08/2023 0928   EOSABS 0.2 01/08/2023 0928   EOSABS 0.2 12/19/2017 0907   BASOSABS 0.0 01/08/2023 0928   BASOSABS 0.0 12/19/2017 0907   CMP    Component Value Date/Time   NA  137 01/10/2023 1115   NA 138 03/31/2021 1303   K 4.0 01/10/2023 1115   CL 109 01/10/2023 1115   CO2 19 (L) 01/10/2023 1115   GLUCOSE 104 (H) 01/10/2023 1115   BUN 13 01/10/2023 1115   BUN 12 03/31/2021 1303   CREATININE 0.83 01/10/2023 1115   CREATININE 0.91 12/06/2022 1052   CALCIUM 9.2 01/10/2023 1115   PROT 7.0 01/08/2023 1117   PROT 6.8 12/19/2017 0907   ALBUMIN 3.6 01/08/2023 1117   ALBUMIN 4.0 12/19/2017 0907   AST 22 01/08/2023 1117   ALT 19 01/08/2023 1117   ALKPHOS 97 01/08/2023 1117   BILITOT 0.4 01/08/2023 1117   BILITOT 0.2 12/19/2017 0907   GFRNONAA >60 01/10/2023 1115   GFRAA >60 08/07/2019 1542   COAGS Lab Results  Component Value Date   INR 1.1 01/08/2023   INR 1.1 12/13/2020   INR 1.3 (H) 10/26/2015   Lipid Panel    Component Value Date/Time   CHOL 159 01/09/2023 0454   TRIG 85 01/09/2023 0454   HDL 38 (L) 01/09/2023 0454   CHOLHDL 4.2 01/09/2023 0454   VLDL 17 01/09/2023 0454   LDLCALC 104 (H) 01/09/2023 0454   HgbA1C  Lab Results  Component Value Date   HGBA1C 7.1 (H) 01/09/2023   Urine Drug Screen positive for THC Alcohol Level    Component Value Date/Time   ETH <10 01/08/2023 1130     SIGNIFICANT DIAGNOSTIC STUDIES MR BRAIN WO CONTRAST  Result Date: 01/10/2023 CLINICAL DATA:  Stroke, follow up. EXAM: MRI HEAD WITHOUT CONTRAST TECHNIQUE: Multiplanar, multiecho pulse sequences of the brain and surrounding structures were obtained without intravenous contrast. COMPARISON:  CT head 01/09/2023. FINDINGS: Brain: No acute infarction, hemorrhage, hydrocephalus, extra-axial collection or mass lesion. Vascular: Major arterial flow voids are maintained at the skull base. Skull and upper cervical spine: Normal marrow signal. Sinuses/Orbits: Mostly clear sinuses.  No acute orbital findings. Other: No mastoid effusions. IMPRESSION: No evidence of acute intracranial abnormality. Electronically Signed   By: Feliberto Harts M.D.   On: 01/10/2023 17:56    ECHOCARDIOGRAM COMPLETE  Result Date: 01/09/2023    ECHOCARDIOGRAM REPORT   Patient Name:   Courtney Davies Date of Exam: 01/09/2023 Medical Rec #:  [id number]       Height:       66.0 in Accession #:    [id number]      Weight:       233.2 lb Date of Birth:  Jul 25, 1975       BSA:          2.135 m Patient Age:    47 years        BP:  Stroke Discharge Summary  Patient ID: Courtney Davies   MRN: [id number]      DOB: 15-Sep-1975  Date of Admission: 01/08/2023 Date of Discharge: 01/12/2023  Attending Physician:  Marvel Plan MD Consultant(s):    None  Patient's PCP:  [name], NP  DISCHARGE PRIMARY DIAGNOSIS:   Stroke-like symptoms s/p TNK, etiology: TIA given PAF vs complicated migraine with dizziness and blurry vision.    Secondary Diagnoses: Hypertension Hyperlipidemia Diabetes Type II Atrial fibrillation Hx of VF cardiac arrest s/p ICD placement   Allergies as of 01/12/2023       Reactions   Latex Hives   Other Other (See Comments), Anaphylaxis, Swelling, Hives   Hair glue causes throat to close and hives "glue" Hair glue causes throat to close   Codeine Nausea And Vomiting   Was on an empty stomach.   Nitrofurantoin Rash        Medication List     TAKE these medications    Accu-Chek Guide Me w/Device Kit SMARTSIG:Via Meter   Accu-Chek Guide test strip Generic drug: glucose blood   acetaminophen 500 MG tablet Commonly known as: TYLENOL Take 1 tablet (500 mg total) by mouth every 6 (six) hours as needed.   amiodarone 200 MG tablet Commonly known as: PACERONE Take 200 mg by mouth daily.   apixaban 5 MG Tabs tablet Commonly known as: ELIQUIS Take 1 tablet (5 mg total) by mouth 2 (two) times daily.   atorvastatin 40 MG tablet Commonly known as: LIPITOR Take 1 tablet (40 mg total) by mouth daily. Start taking on: January 13, 2023   BD Pen Needle Nano 2nd Gen 32G X 4 MM Misc Generic drug: Insulin Pen Needle 3 (three) times daily. as directed   carvedilol 6.25 MG tablet Commonly known as: COREG TAKE 1 TABLET BY MOUTH TWICE DAILY   diclofenac Sodium 1 % Gel Commonly known as: Voltaren Apply 4 g topically 4 (four) times daily. Apply to left knee   empagliflozin 10 MG Tabs tablet Commonly known as: Jardiance Take 1 tablet (10 mg total) by mouth every morning.    hydrOXYzine 25 MG capsule Commonly known as: VISTARIL Take 1 capsule (25 mg total) by mouth at bedtime as needed.   INSULIN SYRINGE .5CC/31GX5/16" 31G X 5/16" 0.5 ML Misc 3 (three) times daily.   losartan 25 MG tablet Commonly known as: COZAAR TAKE 1 TABLET BY MOUTH DAILY   metformin 500 MG (OSM) 24 hr tablet Commonly known as: FORTAMET Take 1,000 mg by mouth daily with breakfast.   metroNIDAZOLE 0.75 % vaginal gel Commonly known as: METROGEL Place 1 Applicatorful vaginally daily. For 5 days   Ozempic (0.25 or 0.5 MG/DOSE) 2 MG/3ML Sopn Generic drug: Semaglutide(0.25 or 0.5MG /DOS) Inject 0.5 mg into the skin once a week.   QUEtiapine 25 MG tablet Commonly known as: SEROQUEL TAKE 1 TABLET(25 MG) BY MOUTH AT BEDTIME What changed: See the new instructions.   rivastigmine 3 MG capsule Commonly known as: EXELON TAKE 1 CAPSULE(3 MG) BY MOUTH TWICE DAILY What changed: See the new instructions.   sertraline 50 MG tablet Commonly known as: ZOLOFT Take 1 tablet (50 mg total) by mouth daily.   Ventolin HFA 108 (90 Base) MCG/ACT inhaler Generic drug: albuterol Inhale 2 puffs into the lungs every 6 (six) hours as needed for wheezing or shortness of breath.        LABORATORY STUDIES CBC    Component Value Date/Time   WBC 5.2 01/10/2023  1115   RBC 6.62 (H) 01/10/2023 1115   HGB 14.9 01/10/2023 1115   HGB 12.4 03/31/2021 1303   HCT 47.5 (H) 01/10/2023 1115   HCT 40.8 03/31/2021 1303   PLT 282 01/10/2023 1115   PLT 248 03/31/2021 1303   MCV 71.8 (L) 01/10/2023 1115   MCV 75 (L) 03/31/2021 1303   MCH 22.5 (L) 01/10/2023 1115   MCHC 31.4 01/10/2023 1115   RDW 16.6 (H) 01/10/2023 1115   RDW 13.3 03/31/2021 1303   LYMPHSABS 2.8 01/08/2023 0928   LYMPHSABS 2.8 12/19/2017 0907   MONOABS 0.5 01/08/2023 0928   EOSABS 0.2 01/08/2023 0928   EOSABS 0.2 12/19/2017 0907   BASOSABS 0.0 01/08/2023 0928   BASOSABS 0.0 12/19/2017 0907   CMP    Component Value Date/Time   NA  137 01/10/2023 1115   NA 138 03/31/2021 1303   K 4.0 01/10/2023 1115   CL 109 01/10/2023 1115   CO2 19 (L) 01/10/2023 1115   GLUCOSE 104 (H) 01/10/2023 1115   BUN 13 01/10/2023 1115   BUN 12 03/31/2021 1303   CREATININE 0.83 01/10/2023 1115   CREATININE 0.91 12/06/2022 1052   CALCIUM 9.2 01/10/2023 1115   PROT 7.0 01/08/2023 1117   PROT 6.8 12/19/2017 0907   ALBUMIN 3.6 01/08/2023 1117   ALBUMIN 4.0 12/19/2017 0907   AST 22 01/08/2023 1117   ALT 19 01/08/2023 1117   ALKPHOS 97 01/08/2023 1117   BILITOT 0.4 01/08/2023 1117   BILITOT 0.2 12/19/2017 0907   GFRNONAA >60 01/10/2023 1115   GFRAA >60 08/07/2019 1542   COAGS Lab Results  Component Value Date   INR 1.1 01/08/2023   INR 1.1 12/13/2020   INR 1.3 (H) 10/26/2015   Lipid Panel    Component Value Date/Time   CHOL 159 01/09/2023 0454   TRIG 85 01/09/2023 0454   HDL 38 (L) 01/09/2023 0454   CHOLHDL 4.2 01/09/2023 0454   VLDL 17 01/09/2023 0454   LDLCALC 104 (H) 01/09/2023 0454   HgbA1C  Lab Results  Component Value Date   HGBA1C 7.1 (H) 01/09/2023   Urine Drug Screen positive for THC Alcohol Level    Component Value Date/Time   ETH <10 01/08/2023 1130     SIGNIFICANT DIAGNOSTIC STUDIES MR BRAIN WO CONTRAST  Result Date: 01/10/2023 CLINICAL DATA:  Stroke, follow up. EXAM: MRI HEAD WITHOUT CONTRAST TECHNIQUE: Multiplanar, multiecho pulse sequences of the brain and surrounding structures were obtained without intravenous contrast. COMPARISON:  CT head 01/09/2023. FINDINGS: Brain: No acute infarction, hemorrhage, hydrocephalus, extra-axial collection or mass lesion. Vascular: Major arterial flow voids are maintained at the skull base. Skull and upper cervical spine: Normal marrow signal. Sinuses/Orbits: Mostly clear sinuses.  No acute orbital findings. Other: No mastoid effusions. IMPRESSION: No evidence of acute intracranial abnormality. Electronically Signed   By: Feliberto Harts M.D.   On: 01/10/2023 17:56    ECHOCARDIOGRAM COMPLETE  Result Date: 01/09/2023    ECHOCARDIOGRAM REPORT   Patient Name:   Courtney Davies Date of Exam: 01/09/2023 Medical Rec #:  [id number]       Height:       66.0 in Accession #:    [id number]      Weight:       233.2 lb Date of Birth:  Jul 25, 1975       BSA:          2.135 m Patient Age:    47 years        BP:  1115   RBC 6.62 (H) 01/10/2023 1115   HGB 14.9 01/10/2023 1115   HGB 12.4 03/31/2021 1303   HCT 47.5 (H) 01/10/2023 1115   HCT 40.8 03/31/2021 1303   PLT 282 01/10/2023 1115   PLT 248 03/31/2021 1303   MCV 71.8 (L) 01/10/2023 1115   MCV 75 (L) 03/31/2021 1303   MCH 22.5 (L) 01/10/2023 1115   MCHC 31.4 01/10/2023 1115   RDW 16.6 (H) 01/10/2023 1115   RDW 13.3 03/31/2021 1303   LYMPHSABS 2.8 01/08/2023 0928   LYMPHSABS 2.8 12/19/2017 0907   MONOABS 0.5 01/08/2023 0928   EOSABS 0.2 01/08/2023 0928   EOSABS 0.2 12/19/2017 0907   BASOSABS 0.0 01/08/2023 0928   BASOSABS 0.0 12/19/2017 0907   CMP    Component Value Date/Time   NA  137 01/10/2023 1115   NA 138 03/31/2021 1303   K 4.0 01/10/2023 1115   CL 109 01/10/2023 1115   CO2 19 (L) 01/10/2023 1115   GLUCOSE 104 (H) 01/10/2023 1115   BUN 13 01/10/2023 1115   BUN 12 03/31/2021 1303   CREATININE 0.83 01/10/2023 1115   CREATININE 0.91 12/06/2022 1052   CALCIUM 9.2 01/10/2023 1115   PROT 7.0 01/08/2023 1117   PROT 6.8 12/19/2017 0907   ALBUMIN 3.6 01/08/2023 1117   ALBUMIN 4.0 12/19/2017 0907   AST 22 01/08/2023 1117   ALT 19 01/08/2023 1117   ALKPHOS 97 01/08/2023 1117   BILITOT 0.4 01/08/2023 1117   BILITOT 0.2 12/19/2017 0907   GFRNONAA >60 01/10/2023 1115   GFRAA >60 08/07/2019 1542   COAGS Lab Results  Component Value Date   INR 1.1 01/08/2023   INR 1.1 12/13/2020   INR 1.3 (H) 10/26/2015   Lipid Panel    Component Value Date/Time   CHOL 159 01/09/2023 0454   TRIG 85 01/09/2023 0454   HDL 38 (L) 01/09/2023 0454   CHOLHDL 4.2 01/09/2023 0454   VLDL 17 01/09/2023 0454   LDLCALC 104 (H) 01/09/2023 0454   HgbA1C  Lab Results  Component Value Date   HGBA1C 7.1 (H) 01/09/2023   Urine Drug Screen positive for THC Alcohol Level    Component Value Date/Time   ETH <10 01/08/2023 1130     SIGNIFICANT DIAGNOSTIC STUDIES MR BRAIN WO CONTRAST  Result Date: 01/10/2023 CLINICAL DATA:  Stroke, follow up. EXAM: MRI HEAD WITHOUT CONTRAST TECHNIQUE: Multiplanar, multiecho pulse sequences of the brain and surrounding structures were obtained without intravenous contrast. COMPARISON:  CT head 01/09/2023. FINDINGS: Brain: No acute infarction, hemorrhage, hydrocephalus, extra-axial collection or mass lesion. Vascular: Major arterial flow voids are maintained at the skull base. Skull and upper cervical spine: Normal marrow signal. Sinuses/Orbits: Mostly clear sinuses.  No acute orbital findings. Other: No mastoid effusions. IMPRESSION: No evidence of acute intracranial abnormality. Electronically Signed   By: Feliberto Harts M.D.   On: 01/10/2023 17:56    ECHOCARDIOGRAM COMPLETE  Result Date: 01/09/2023    ECHOCARDIOGRAM REPORT   Patient Name:   Courtney Davies Date of Exam: 01/09/2023 Medical Rec #:  [id number]       Height:       66.0 in Accession #:    [id number]      Weight:       233.2 lb Date of Birth:  Jul 25, 1975       BSA:          2.135 m Patient Age:    47 years        BP:

## 2023-01-10 NOTE — Progress Notes (Signed)
PT Cancellation Note  Patient Details Name: [name] MRN: [id number] DOB: Nov 16, 1975   Cancelled Treatment:    Reason Eval/Treat Not Completed: Patient at procedure or test/unavailable  Currently off unit for MRI; Will try to visit pt when she returns, as schedule allows.   Kathlyn Sacramento, PT, DPT [facility]  Rehabilitation Services Physical Therapist Office: [phone] Website: [email or web address]  Berton Mount 01/10/2023, 1:38 PM

## 2023-01-10 NOTE — Progress Notes (Signed)
Physical Therapy Treatment Patient Details Name: Courtney Davies MRN: [id number] DOB: 1975-07-05 Today's Date: 01/10/2023   History of Present Illness Courtney Davies is a 47 year old female presented to the ED 9/24 via EMS for acute onset dizziness and blurred vision. CT: negative. TNK: 10:10 AM 9/24.PHMx: anoxic brain injury after Vfib cardiac arrest in 2022 with ICD placement , hypertension, migraines, LBP, asthma, anxiety, COVID and type 2 diabetes    PT Comments  Mobilizing a little better today, increased distance but required min assist +2 for safety with Lt knee spontaneously buckling. Able to support herself with RW but needs help to control and frequent cues for placement within proximity. Pt reports hx of Lt knee pain/OA. Sister present and states behavior seems baseline for pt however typically oriented x4 and pt having some trouble quickly recalling orientation questions, also provided incorrect month. Patient will continue to benefit from skilled physical therapy services to further improve independence with functional mobility. Suspect if pt can progress to supervision level during acute stay, and her mother can provide 24/7 support, she could d/c home with HHPT but at present she still seems to be most appropriate for CIR given her high PLOF, and spontaneous Lt knee buckling.  Will continue to progress as tolerated during admission and update recs as appropriate.   If plan is discharge home, recommend the following: A lot of help with walking and/or transfers;Assistance with cooking/housework;A little help with bathing/dressing/bathroom;Assist for transportation;Help with stairs or ramp for entrance;Supervision due to cognitive status;Direct supervision/assist for medications management   Can travel by private vehicle      yes  Equipment Recommendations  None recommended by PT (TBD at next venue)    Recommendations for Other Services       Precautions / Restrictions  Precautions Precautions: Fall Precaution Comments: SBP <180 Restrictions Weight Bearing Restrictions: No     Mobility  Bed Mobility Overal bed mobility: Needs Assistance Bed Mobility: Supine to Sit, Sit to Supine     Supine to sit: Supervision Sit to supine: Supervision   General bed mobility comments: Supervision for safety, slow to rise. Cues for technique, performed x2    Transfers Overall transfer level: Needs assistance Equipment used: Rolling walker (2 wheels) Transfers: Sit to/from Stand Sit to Stand: Min assist, +2 safety/equipment           General transfer comment: Min assist for boost and balance initial stand. Second trial CGA. Pt required cues for hand and foot placement.    Ambulation/Gait Ambulation/Gait assistance: Min assist, +2 safety/equipment Gait Distance (Feet): 65 Feet Assistive device: Rolling walker (2 wheels) Gait Pattern/deviations: Step-through pattern, Knees buckling, Decreased stride length Gait velocity: decr Gait velocity interpretation: <1.31 ft/sec, indicative of household ambulator   General Gait Details: Frequent cues for proximity to RW for adequate support. Lt knee buckled approx 3 times. Able to support self with RW but provided min assist throughout for intermittent assist with walker placemand and control during turns. Educated on awareness and safe AD use.   Stairs             Wheelchair Mobility     Tilt Bed    Modified Rankin (Stroke Patients Only)       Balance Overall balance assessment: Needs assistance Sitting-balance support: No upper extremity supported, Feet supported Sitting balance-Leahy Scale: Good     Standing balance support: Single extremity supported, Reliant on assistive device for balance Standing balance-Leahy Scale: Poor  Cognition Arousal: Alert Behavior During Therapy: WFL for tasks assessed/performed (animated) Overall Cognitive Status:  Impaired/Different from baseline Area of Impairment: Following commands, Safety/judgement, Problem solving, Orientation                 Orientation Level: Disoriented to, Time (incorrect month)     Following Commands: Follows one step commands inconsistently Safety/Judgement: Decreased awareness of safety, Decreased awareness of deficits   Problem Solving: Difficulty sequencing, Decreased initiation, Requires verbal cues General Comments: Sister reports pt typically Oriented x4. Personality seems near baseline though.        Exercises General Exercises - Lower Extremity Ankle Circles/Pumps: AROM, Both, 10 reps, Seated Quad Sets: Strengthening, Both, 10 reps, Seated Gluteal Sets: Strengthening, Both, 10 reps, Seated Long Arc Quad: Strengthening, Both, 10 reps, Seated    General Comments        Pertinent Vitals/Pain Pain Assessment Pain Assessment: No/denies pain    Home Living                          Prior Function            PT Goals (current goals can now be found in the care plan section) Acute Rehab PT Goals Patient Stated Goal: go home and keep working PT Goal Formulation: With patient Time For Goal Achievement: 01/23/23 Potential to Achieve Goals: Good Progress towards PT goals: Progressing toward goals    Frequency    Min 1X/week      PT Plan      Co-evaluation              AM-PAC PT "6 Clicks" Mobility   Outcome Measure  Help needed turning from your back to your side while in a flat bed without using bedrails?: None Help needed moving from lying on your back to sitting on the side of a flat bed without using bedrails?: None Help needed moving to and from a bed to a chair (including a wheelchair)?: A Little Help needed standing up from a chair using your arms (e.g., wheelchair or bedside chair)?: A Little Help needed to walk in hospital room?: A Little Help needed climbing 3-5 steps with a railing? : Total 6 Click Score:  18    End of Session Equipment Utilized During Treatment: Gait belt Activity Tolerance: Patient tolerated treatment well Patient left: in chair;with call bell/phone within reach;with chair alarm set;with family/visitor present;with SCD's reapplied   PT Visit Diagnosis: Muscle weakness (generalized) (M62.81);Difficulty in walking, not elsewhere classified (R26.2);Other abnormalities of gait and mobility (R26.89)     Time: 1610-9604 PT Time Calculation (min) (ACUTE ONLY): 25 min  Charges:    $Gait Training: 8-22 mins $Therapeutic Activity: 8-22 mins PT General Charges $$ ACUTE PT VISIT: 1 Visit                     Kathlyn Sacramento, PT, DPT [facility]  Rehabilitation Services Physical Therapist Office: [phone] Website: [email or web address]    Berton Mount 01/10/2023, 3:54 PM

## 2023-01-10 NOTE — Progress Notes (Signed)
Pt c/o pain to her left leg as well as numbness-not new. She is also c/o headache.  Neurologist doctor Harrison notified. New orders placed.

## 2023-01-10 NOTE — Progress Notes (Signed)
Inpatient Rehab Admissions Coordinator Note:   Per therapy recommendations patient was screened for CIR candidacy by Stephania Fragmin, PT. At this time, pt appears to be a potential candidate for CIR. I will place an order for rehab consult for full assessment, per our protocol.  Please contact me any with questions.Estill Dooms, PT, DPT [phone] 01/10/23 2:09 PM

## 2023-01-10 NOTE — Progress Notes (Addendum)
STROKE TEAM PROGRESS NOTE   BRIEF HPI Ms. Courtney Davies is a 47 y.o. female with history of anoxic brain injury after VF arrest in 2022 s/p ICD placement with transient episode of atrial fibrillation during hospitalization without recurrence on amiodarone, essential HTN, and diabetes presenting from work on 9/24 with acute onset of dizziness described as room spinning and blurred vision.  With complaints of severe vertigo and concern for possible posterior circulation infarct, patient was given TNK and admitted for further stroke workup.  SIGNIFICANT HOSPITAL EVENTS 9/24: -CT head no acute intracranial hemorrhage or evidence of acute large vessel territory infarct.  Aspect score is 10.   -CTA head and neck negative CTA.  No occlusion, stenosis, or irregularity of major arteries in the head and neck. - TNK administered due to concern for possible posterior circulation infarct with complaints of severe vertigo. 9/25: - Repeat CT head no evidence of acute intracranial abnormality.  - MRI ordered, delayed due to patient with ICD 9/26: MRI resulted, appears negative for acute infarct but will await official read.  INTERIM HISTORY/SUBJECTIVE Patient is seen in her room with no family at the bedside.  She reports that her gait is more steady today and continues to exhibit forehead splitting to vibration on exam.  Her MRI was performed, and appears negative, but will wait for official read.  She is being evaluated for admission to CIR. MRI brain is negative for acute stroke.  Official report is pending OBJECTIVE CBC    Component Value Date/Time   WBC 5.2 01/10/2023 1115   RBC 6.62 (H) 01/10/2023 1115   HGB 14.9 01/10/2023 1115   HGB 12.4 03/31/2021 1303   HCT 47.5 (H) 01/10/2023 1115   HCT 40.8 03/31/2021 1303   PLT 282 01/10/2023 1115   PLT 248 03/31/2021 1303   MCV 71.8 (L) 01/10/2023 1115   MCV 75 (L) 03/31/2021 1303   MCH 22.5 (L) 01/10/2023 1115   MCHC 31.4 01/10/2023 1115   RDW 16.6  (H) 01/10/2023 1115   RDW 13.3 03/31/2021 1303   LYMPHSABS 2.8 01/08/2023 0928   LYMPHSABS 2.8 12/19/2017 0907   MONOABS 0.5 01/08/2023 0928   EOSABS 0.2 01/08/2023 0928   EOSABS 0.2 12/19/2017 0907   BASOSABS 0.0 01/08/2023 0928   BASOSABS 0.0 12/19/2017 0907   BMET    Component Value Date/Time   NA 137 01/10/2023 1115   NA 138 03/31/2021 1303   K 4.0 01/10/2023 1115   CL 109 01/10/2023 1115   CO2 19 (L) 01/10/2023 1115   GLUCOSE 104 (H) 01/10/2023 1115   BUN 13 01/10/2023 1115   BUN 12 03/31/2021 1303   CREATININE 0.83 01/10/2023 1115   CREATININE 0.91 12/06/2022 1052   CALCIUM 9.2 01/10/2023 1115   EGFR 79 12/06/2022 1052   EGFR 111 03/31/2021 1303   GFRNONAA >60 01/10/2023 1115   IMAGING past 24 hours No results found.  Vitals:   01/09/23 2325 01/10/23 0318 01/10/23 0807 01/10/23 1214  BP: (!) 142/92 (!) 151/78 (!) 151/89 (!) 160/96  Pulse: 61 (!) 55 61 (!) 58  Resp: 16 18 18 18   Temp: 98 F (36.7 C) 97.8 F (36.6 C) 97.7 F (36.5 C) 98.6 F (37 C)  TempSrc: Oral Oral Oral   SpO2: 100% 100% 100% 100%  Weight:      Height:       PHYSICAL EXAM General:  Alert, well-nourished, well-developed African-American female in no acute distress Psych: Patient is cooperative throughout assessment but does appear  anxious.  Examination is intermittently effort dependent. CV: Regular rate and rhythm on monitor Respiratory:  Regular, unlabored respirations on room air GI: Abdomen soft and nontender  NEURO:  Mental Status: AA&Ox3, patient is able to give clear and coherent history Speech/Language: speech is without dysarthria or aphasia.  Naming, repetition, fluency, and comprehension intact.  Cranial Nerves:  II: PERRL. III, IV, VI: EOMI. patient fixates and tracks examiner throughout. V: Patient reports numbness to the left face but splits midline with light touch and vibration via tuning fork VII: Face is symmetrical resting and with movement VIII: Hearing intact to  voice. IX, X: Phonation is normal.  UU:[id number] shrug 5/5. XII: Tongue protrudes midline Motor: Strength appears symmetrical to bilateral upper and lower extremities Tone: is normal and bulk is normal Sensation: Patient reports numbness to the left whole face, left upper extremity, and left lower extremity.  With splitting of the midline.  She also splits the forehead to vibration sensation Coordination: No ataxia noted Gait: Slow but steady  ASSESSMENT/PLAN  Stroke-like symptoms s/p TNK administration. Etiology: Low suspicion for ischemic etiology.  Presentation possibly related to acute episode of vertigo, complicated migraine headache, low suspicion for acute stroke though will obtain MRI brain.  There nonorganic features on exam with functional overlay throughout assessment. CT head 9/24: No acute intracranial hemorrhage or evidence of acute large vessel territory infarct.  Aspects score is 10. CTA head & neck 9/24: Negative CTA.  No occlusion, stenosis, or irregularity of major arteries in the head and neck. Repeat CT head 24 hours post TNK 9/25: No evidence of acute intracranial abnormality.  Minor paranasal sinus disease. MRI ordered/pending 9/26 (ICD compatible) 2D Echo LVEF 60 to 65% LDL 104 HgbA1c 7.3 VTE prophylaxis - SCDs post TNK.  No antithrombotic prior to admission, now on aspirin 81 mg daily  Therapy recommendations: CIR Disposition: Pending  Atrial Fibrillation, transient during admission following V-fib arrest in September 2022 No recurrence since September 2022 Followed by [facility] heart [facility] Dr. Elberta Fortis most recent note 12/20/2021 indicated that patient takes 200 mg daily of amiodarone  Less than 1% burden of atrial fibrillation noted on her device during visit 12/20/2021 with plans to discontinue amiodarone if no further episodes in the future CHA2DS2-VASc of 0 documented 12/20/21, not on anticoagulation   Hypertension Home meds:  Losartan Stable Normotension  Hyperlipidemia Home meds: None LDL 104, goal < 70 Add atorvastatin 40 mg PO daily   Continue statin at discharge  Diabetes type II Uncontrolled Hyperglycemia Home meds: Metformin, Ozempic Follow up note from 12/06/22 indicates patient is supposed to be taking Jardiance, SSI, and trulicity  HgbA1c 7.3, goal < 7.0 CBGs SSI Recommend close follow-up with PCP for better DM control  Substance Abuse Patient uses marijuana UDS positive for  THC       Ready to quit? N/A  Other Stroke Risk Factors Obesity, Body mass index is 37.65 kg/m., BMI >/= 30 associated with increased stroke risk, recommend weight loss, diet and exercise as appropriate  Family hx stroke (maternal grandmother)  Other Active Problems Reactive depression On Zoloft Anoxic brain injury from V-fib arrest in August 2022 Followed by Dr. Hermelinda Medicus S/p ICD placement Interrogate ICD  Hospital day # 2  Patient seen by NP and then by MD, MD to edit note as needed. Cortney E Ernestina Columbia , MSN, AGACNP-BC [facility] See Amion for schedule and pager information 01/10/2023 2:21 PM Continue mobilize out of bed.  Ongoing therapies.  Discharged home if  improved and safe to go home otherwise continue ongoing therapies and rehab.  Greater than 50% time during this 35-minute visit was spent on counseling and coordination of care about her strokelike episode and discussion with patient and care team and answering questions Delia Heady, MD To contact Stroke Continuity provider, please refer to [email or web address]. After hours, contact General Neurology

## 2023-01-10 NOTE — Inpatient Diabetes Management (Addendum)
Inpatient Diabetes Program Recommendations  AACE/ADA: New Consensus Statement on Inpatient Glycemic Control (2015)  Target Ranges:  Prepandial:   less than 140 mg/dL      Peak postprandial:   less than 180 mg/dL (1-2 hours)      Critically ill patients:  140 - 180 mg/dL   Lab Results  Component Value Date   GLUCAP 128 (H) 01/10/2023   HGBA1C 7.1 (H) 01/09/2023    Review of Glycemic Control  Latest Reference Range & Units 01/09/23 08:26 01/09/23 12:22 01/09/23 16:38 01/09/23 23:21 01/10/23 06:09  Glucose-Capillary 70 - 99 mg/dL 161 (H) 096 (H) 73 045 (H) 128 (H)   Diabetes history: DM  Outpatient Diabetes medications:  Jardiance 10 mg daily Metformin 1000 mg with breakfast Ozempic 0.5 mg weekly Per MD note she takes PRN Novolog for highs? Wears sensor Current orders for Inpatient glycemic control:  Novolog 0-15 units tid with meals   Inpatient Diabetes Program Recommendations:    Referral received regarding "hypoglycemia".  No hypoglycemia since hospitalization.  Will discuss with patient?  Consider reducing Novolog correction to sensitive tid with meals.   Addendum:  Spoke with patient regarding reports of "low blood sugars".  I attempted to inquire about what patient takes at home and she states that her mother does her medications.  She reports decreased appetite and states that "nothing tastes good". No insulins are listed on medication reconciliation, however note in visit with MD in July of 2024 that patient "occasionally takes insulin" when blood sugars are high?  Discussed goal blood sugars with patient.  She wears a sensor and states she frequently gets low alarms in the 40's?  Told patient about importance of avoidance of low blood sugars and treatment.  Patient asked me to call her mom. Spoke with mother and she states that patient does have insulin but that she rarely takes it.  Discussed importance of avoiding hypoglycemia and close f/u with diabetes doctor.  I would not  recommend PRN Novolog for this patient at home and recommend discontinuation from home medications at discharge.   Thanks,  Beryl Meager, RN, BC-ADM Inpatient Diabetes Coordinator Pager [phone]  (8a-5p)

## 2023-01-11 DIAGNOSIS — R2990 Unspecified symptoms and signs involving the nervous system: Secondary | ICD-10-CM | POA: Diagnosis not present

## 2023-01-11 LAB — GLUCOSE, CAPILLARY
Glucose-Capillary: 114 mg/dL — ABNORMAL HIGH (ref 70–99)
Glucose-Capillary: 83 mg/dL (ref 70–99)
Glucose-Capillary: 112 mg/dL — ABNORMAL HIGH (ref 70–99)
Glucose-Capillary: 125 mg/dL — ABNORMAL HIGH (ref 70–99)

## 2023-01-11 NOTE — Progress Notes (Addendum)
STROKE TEAM PROGRESS NOTE   BRIEF HPI Courtney Davies is a 47 y.o. female with history of anoxic brain injury after VF arrest in 2022 s/p ICD placement with transient episode of atrial fibrillation during hospitalization without recurrence on amiodarone, essential HTN, and diabetes presenting from work on 9/24 with acute onset of dizziness described as room spinning and blurred vision.  With complaints of severe vertigo and concern for possible posterior circulation infarct, patient was given TNK and admitted for further stroke workup.  SIGNIFICANT HOSPITAL EVENTS 9/24: -CT head no acute intracranial hemorrhage or evidence of acute large vessel territory infarct.  Aspect score is 10.   -CTA head and neck negative CTA.  No occlusion, stenosis, or irregularity of major arteries in the head and neck. - TNK administered due to concern for possible posterior circulation infarct with complaints of severe vertigo. 9/25: - Repeat CT head no evidence of acute intracranial abnormality.  - MRI ordered, delayed due to patient with ICD 9/26: MRI resulted, negative for acute infarct  INTERIM HISTORY/SUBJECTIVE Patient is seen in her room with no family at the bedside.  She is hoping to be transferred to rehab or discharge soon.  She has been hemodynamically stable, and her neurological exam is unchanged.  OBJECTIVE CBC    Component Value Date/Time   WBC 5.2 01/10/2023 1115   RBC 6.62 (H) 01/10/2023 1115   HGB 14.9 01/10/2023 1115   HGB 12.4 03/31/2021 1303   HCT 47.5 (H) 01/10/2023 1115   HCT 40.8 03/31/2021 1303   PLT 282 01/10/2023 1115   PLT 248 03/31/2021 1303   MCV 71.8 (L) 01/10/2023 1115   MCV 75 (L) 03/31/2021 1303   MCH 22.5 (L) 01/10/2023 1115   MCHC 31.4 01/10/2023 1115   RDW 16.6 (H) 01/10/2023 1115   RDW 13.3 03/31/2021 1303   LYMPHSABS 2.8 01/08/2023 0928   LYMPHSABS 2.8 12/19/2017 0907   MONOABS 0.5 01/08/2023 0928   EOSABS 0.2 01/08/2023 0928   EOSABS 0.2 12/19/2017 0907    BASOSABS 0.0 01/08/2023 0928   BASOSABS 0.0 12/19/2017 0907   BMET    Component Value Date/Time   NA 137 01/10/2023 1115   NA 138 03/31/2021 1303   K 4.0 01/10/2023 1115   CL 109 01/10/2023 1115   CO2 19 (L) 01/10/2023 1115   GLUCOSE 104 (H) 01/10/2023 1115   BUN 13 01/10/2023 1115   BUN 12 03/31/2021 1303   CREATININE 0.83 01/10/2023 1115   CREATININE 0.91 12/06/2022 1052   CALCIUM 9.2 01/10/2023 1115   EGFR 79 12/06/2022 1052   EGFR 111 03/31/2021 1303   GFRNONAA >60 01/10/2023 1115   IMAGING past 24 hours MR BRAIN WO CONTRAST  Result Date: 01/10/2023 CLINICAL DATA:  Stroke, follow up. EXAM: MRI HEAD WITHOUT CONTRAST TECHNIQUE: Multiplanar, multiecho pulse sequences of the brain and surrounding structures were obtained without intravenous contrast. COMPARISON:  CT head 01/09/2023. FINDINGS: Brain: No acute infarction, hemorrhage, hydrocephalus, extra-axial collection or mass lesion. Vascular: Major arterial flow voids are maintained at the skull base. Skull and upper cervical spine: Normal marrow signal. Sinuses/Orbits: Mostly clear sinuses.  No acute orbital findings. Other: No mastoid effusions. IMPRESSION: No evidence of acute intracranial abnormality. Electronically Signed   By: Feliberto Harts M.D.   On: 01/10/2023 17:56    Vitals:   01/11/23 0323 01/11/23 0732 01/11/23 1114 01/11/23 1207  BP: (!) 150/71 125/78 (!) 146/112 (!) 155/98  Pulse: (!) 53 (!) 59 68   Resp: 18 18 18  Temp: 97.6 F (36.4 C) 97.8 F (36.6 C) 98.3 F (36.8 C)   TempSrc: Oral Oral Oral   SpO2: 99% 100% 98%   Weight:      Height:       PHYSICAL EXAM General:  Alert, well-nourished, well-developed African-American female in no acute distress Psych: Patient is cooperative throughout assessment but does appear anxious.  Examination is intermittently effort dependent. CV: Regular rate and rhythm on monitor Respiratory:  Regular, unlabored respirations on room air GI: Abdomen soft and  nontender  NEURO:  Mental Status: AA&Ox3, patient is able to give clear and coherent history Speech/Language: speech is without dysarthria or aphasia.  Naming, repetition, fluency, and comprehension intact.  Cranial Nerves:  II: PERRL. III, IV, VI: EOMI. patient fixates and tracks examiner throughout. V: Patient reports numbness to the left face  VII: Face is symmetrical resting and with movement VIII: Hearing intact to voice. IX, X: Phonation is normal.  XII: Tongue protrudes midline Motor: Strength appears symmetrical to bilateral upper and lower extremities Tone: is normal and bulk is normal Sensation: Patient reports numbness to the left whole face, left upper extremity, and left lower extremity.   Coordination: No ataxia noted Gait: Slow but steady  ASSESSMENT/PLAN  Stroke-like symptoms s/p TNK administration with negative MRI. Etiology: Presentation possibly related to acute episode of vertigo, complicated migraine headache or functional neurological symptoms.  There nonorganic features on exam with functional overlay throughout assessment. CT head 9/24: No acute intracranial hemorrhage or evidence of acute large vessel territory infarct.  Aspects score is 10. CTA head & neck 9/24: Negative CTA.  No occlusion, stenosis, or irregularity of major arteries in the head and neck. Repeat CT head 24 hours post TNK 9/25: No evidence of acute intracranial abnormality.  Minor paranasal sinus disease. MRI negative for acute infarct 2D Echo LVEF 60 to 65% LDL 104 HgbA1c 7.3 VTE prophylaxis - SCDs post TNK.  No antithrombotic prior to admission, now on aspirin 81 mg daily  Therapy recommendations: CIR Disposition: Pending  Atrial Fibrillation, transient during admission following V-fib arrest in September 2022 No recurrence since September 2022 Followed [facility] heart care Dr. Elberta Fortis most recent note 12/20/2021 indicated that patient takes 200 mg daily of amiodarone  Less than  1% burden of atrial fibrillation noted on her device during visit 12/20/2021 with plans to discontinue amiodarone if no further episodes in the future CHA2DS2-VASc of 0 documented 12/20/21, not on anticoagulation   Hypertension Home meds: Losartan Stable Normotension  Hyperlipidemia Home meds: None LDL 104, goal < 70 Add atorvastatin 40 mg PO daily   Continue statin at discharge  Diabetes type II Uncontrolled Hyperglycemia Home meds: Metformin, Ozempic Follow up note from 12/06/22 indicates patient is supposed to be taking Jardiance, SSI, and trulicity  HgbA1c 7.3, goal < 7.0 CBGs SSI Recommend close follow-up with PCP for better DM control  Substance Abuse Patient uses marijuana UDS positive for  THC       Ready to quit? N/A  Other Stroke Risk Factors Obesity, Body mass index is 37.65 kg/m., BMI >/= 30 associated with increased stroke risk, recommend weight loss, diet and exercise as appropriate  Family hx stroke (maternal grandmother)  Other Active Problems Reactive depression On Zoloft Anoxic brain injury from V-fib arrest in August 2022 Followed by Dr. Hermelinda Medicus S/p ICD placement Interrogate ICD  Hospital day # 3  Patient seen by NP and then by MD, MD to edit note as needed. Cortney E Ernestina Columbia ,  MSN, AGACNP-BC [facility] See Amion for schedule and pager information 01/11/2023 1:14 PM I have personally obtained history,examined this patient, reviewed notes, independently viewed imaging studies, participated in medical decision making and plan of care.ROS completed by me personally and pertinent positives fully documented  I have made any additions or clarifications directly to the above note. Agree with note above.    Delia Heady, MD Medical Director [facility] Pager: [phone] 01/11/2023 2:32 PM  To contact Stroke Continuity provider, please refer to [email or web address]. After hours, contact General Neurology

## 2023-01-11 NOTE — Progress Notes (Signed)
Inpatient Rehab Coordinator Note:  I met with patient at bedside to discuss CIR recommendations and goals/expectations of CIR stay.  We reviewed 3 hrs/day of therapy, physician follow up, and average length of stay 2 weeks (dependent upon progress) with goals of supervision.  Pt would prefer to d/c home but said she would come to CIR if her family wanted.  I left message for mom to discuss.  Note OT and PT have now updated recs to home health.    Estill Dooms, PT, DPT Admissions Coordinator [phone] 01/11/23  3:14 PM

## 2023-01-11 NOTE — Progress Notes (Signed)
Physical Therapy Treatment Patient Details Name: Courtney Davies MRN: [id number] DOB: 03-28-1976 Today's Date: 01/11/2023   History of Present Illness Courtney Davies is a 47 year old female presented to the ED 9/24 via EMS for acute onset dizziness and blurred vision. CT: negative. TNK: 10:10 AM 9/24.PHMx: anoxic brain injury after Vfib cardiac arrest in 2022 with ICD placement , hypertension, migraines, LBP, asthma, anxiety, COVID and type 2 diabetes    PT Comments  Tolerated well, increased ambulatory distance to 135 feet today with use of RW for support. Pt initially states she will not use RW at home but then is agreeable when discussing importance with sister. Minor instability of Lt knee without overt buckling or LOB today, adequately using RW to unload lightly as needed. Pt has flight of steps to navigate at home but may be staying with her mother at d/c instead. Would like to perform stair training with pt prior to d/c. Sister is awaiting medical update from team (RN notified.) She is also discussing with her mother if they can provide more supervision at home. Pt did not require physical assist with PT today and I suspect if she can mobilize safely again tomorrow, including any stairs she will need to navigate, then HHPT would be more appropriate than CIR given the improvement in her function. Patient will continue to benefit from skilled physical therapy services to further improve independence with functional mobility.     If plan is discharge home, recommend the following: Assistance with cooking/housework;Assist for transportation;Direct supervision/assist for medications management;Direct supervision/assist for financial management;Supervision due to cognitive status   Can travel by private vehicle        Equipment Recommendations  None recommended by PT    Recommendations for Other Services       Precautions / Restrictions Precautions Precautions: Fall Precaution Comments: SBP  <180 Restrictions Weight Bearing Restrictions: No     Mobility  Bed Mobility Overal bed mobility: Modified Independent Bed Mobility: Sit to Supine, Supine to Sit           General bed mobility comments: No assist needed    Transfers Overall transfer level: Needs assistance Equipment used: Rolling walker (2 wheels) Transfers: Sit to/from Stand Sit to Stand: Supervision           General transfer comment: Supervison for safety, cues for walker placement and not to push RW off to the side prior to sitting.    Ambulation/Gait Ambulation/Gait assistance: Supervision Gait Distance (Feet): 135 Feet Assistive device: Rolling walker (2 wheels) Gait Pattern/deviations: Step-through pattern, Decreased stride length, Knees buckling, Antalgic Gait velocity: decr Gait velocity interpretation: <1.31 ft/sec, indicative of household ambulator   General Gait Details: Navigated half distance with light support on RW. Second half Lt knee with mild instability, showing antalgic pattern but able to utilize RW appropriately for increased UE support to unload LLE as needed. No overt LOB.   Stairs             Wheelchair Mobility     Tilt Bed    Modified Rankin (Stroke Patients Only)       Balance Overall balance assessment: Needs assistance Sitting-balance support: No upper extremity supported, Feet supported Sitting balance-Leahy Scale: Good     Standing balance support: No upper extremity supported Standing balance-Leahy Scale: Fair Standing balance comment: improved stability with RW for gait,  Cognition Arousal: Alert Behavior During Therapy: Restless (animated) Overall Cognitive Status: History of cognitive impairments - at baseline                                 General Comments: personality near baseline per family. hx of ABI.        Exercises      General Comments General comments (skin integrity,  edema, etc.): Ordered Z[employer]'s salad from [employer] without assist      Pertinent Vitals/Pain Pain Assessment Pain Assessment: No/denies pain    Home Living                          Prior Function            PT Goals (current goals can now be found in the care plan section) Acute Rehab PT Goals Patient Stated Goal: Get well PT Goal Formulation: With patient Time For Goal Achievement: 01/23/23 Potential to Achieve Goals: Good Progress towards PT goals: Progressing toward goals    Frequency    Min 1X/week      PT Plan      Co-evaluation              AM-PAC PT "6 Clicks" Mobility   Outcome Measure  Help needed turning from your back to your side while in a flat bed without using bedrails?: None Help needed moving from lying on your back to sitting on the side of a flat bed without using bedrails?: None Help needed moving to and from a bed to a chair (including a wheelchair)?: A Little Help needed standing up from a chair using your arms (e.g., wheelchair or bedside chair)?: A Little Help needed to walk in hospital room?: A Little Help needed climbing 3-5 steps with a railing? : A Little 6 Click Score: 20    End of Session Equipment Utilized During Treatment: Gait belt Activity Tolerance: Patient tolerated treatment well Patient left: in bed;with call bell/phone within reach;with bed alarm set Nurse Communication: Mobility status PT Visit Diagnosis: Muscle weakness (generalized) (M62.81);Difficulty in walking, not elsewhere classified (R26.2);Other abnormalities of gait and mobility (R26.89)     Time: 2725-3664 PT Time Calculation (min) (ACUTE ONLY): 13 min  Charges:    $Gait Training: 8-22 mins PT General Charges $$ ACUTE PT VISIT: 1 Visit                     Kathlyn Sacramento, PT, DPT [facility]  Rehabilitation Services Physical Therapist Office: [phone] Website: [email or web address]    Berton Mount 01/11/2023, 3:13 PM

## 2023-01-11 NOTE — Progress Notes (Addendum)
Occupational Therapy Treatment Patient Details Name: Courtney Davies MRN: [id number] DOB: [birth year] Today's Date: 01/11/2023   History of present illness Courtney Davies is a 47 year old female presented to the ED 9/24 via EMS for acute onset dizziness and blurred vision. CT: negative. TNK: 10:10 AM 9/24.PHMx: anoxic brain injury after Vfib cardiac arrest in 2022 with ICD placement , hypertension, migraines, LBP, asthma, anxiety, COVID and type 2 diabetes   OT comments  Pt received in chair eager to get back to bed. Trialed bathroom mobility with and without RW with improved stability noted when using DME. Pt able to manage toileting tasks and ADLs standing at sink without physical assistance. Pt reporting concerns regarding preventing falls at home. Educated on use of RW, keeping phone on self at all times though pt not receptive to this education. Feel intensive therapy services no longer needed but would still recommend consistent supervision at DC for IADL mgmt (meds, cooking, driving) to ensure safety.      If plan is discharge home, recommend the following:  A little help with walking and/or transfers;A little help with bathing/dressing/bathroom;Assist for transportation;Help with stairs or ramp for entrance;Assistance with cooking/housework;Direct supervision/assist for financial management;Direct supervision/assist for medications management;Supervision due to cognitive status   Equipment Recommendations  Tub/shower seat;Other (comment) (Rolling walker)    Recommendations for Other Services      Precautions / Restrictions Precautions Precautions: Fall Precaution Comments: SBP <180 Restrictions Weight Bearing Restrictions: No       Mobility Bed Mobility Overal bed mobility: Modified Independent Bed Mobility: Sit to Supine                Transfers Overall transfer level: Modified independent Equipment used: None, Rolling walker (2 wheels) Transfers: Sit to/from Stand Sit  to Stand: Modified independent (Device/Increase time)           General transfer comment: standing from recliner with RW. standing from toilet easily without AD     Balance Overall balance assessment: Needs assistance Sitting-balance support: No upper extremity supported, Feet supported Sitting balance-Leahy Scale: Good     Standing balance support: Reliant on assistive device for balance, Bilateral upper extremity supported, No upper extremity supported Standing balance-Leahy Scale: Fair Standing balance comment: improved stability with RW for gait, able to stand at sink and in bathroom without UE support. reaching out for UE support when not using AD                           ADL either performed or assessed with clinical judgement   ADL Overall ADL's : Needs assistance/impaired     Grooming: Supervision/safety;Standing;Wash/dry hands                   Toilet Transfer: Contact guard assist;Ambulation;Rolling walker (2 wheels) Toilet Transfer Details (indicate cue type and reason): to bathroom with RW, smooth gait noted. able to stand from toilet without assist. deferred RW for out of bathroom, limping gait to sink and then bed afterwards but no overt LOB or safety concerns (hx of L knee OA per chart) Toileting- Clothing Manipulation and Hygiene: Supervision/safety;Sitting/lateral lean;Sit to/from stand Toileting - Clothing Manipulation Details (indicate cue type and reason): managing clothing and standing from toilet without assist       General ADL Comments: Attempted to discuss pt feelings on managing at home vs need for rehab. pt reports she does not want to go to rehab, reported concerned about falls at home  and getting back up. Educated on use of RW, having phone on her at all times though pt not receptive to this education. discussed being able to confidently walk a household distance    Extremity/Trunk Assessment Upper Extremity Assessment Upper  Extremity Assessment: Overall WFL for tasks assessed;Right hand dominant   Lower Extremity Assessment Lower Extremity Assessment: Defer to PT evaluation        Vision   Vision Assessment?: No apparent visual deficits   Perception     Praxis      Cognition Arousal: Alert Behavior During Therapy: Restless (animated) Overall Cognitive Status: History of cognitive impairments - at baseline Area of Impairment: Safety/judgement, Problem solving, Awareness                         Safety/Judgement: Decreased awareness of safety, Decreased awareness of deficits Awareness: Intellectual, Emergent Problem Solving: Difficulty sequencing, Requires verbal cues General Comments: personality near baseline per family. hx of ABI. follows commands consistently, decreased attention and decreased receptiveness to safety education, declines use of RW at home due to being "too young", poor problem solving of resolution of minor issues (not having preferred salad dressing)        Exercises      Shoulder Instructions       General Comments Ordered Z[employer]by's salad from [employer] without assist    Pertinent Vitals/ Pain       Pain Assessment Pain Assessment: No/denies pain  Home Living                                          Prior Functioning/Environment              Frequency  Min 1X/week        Progress Toward Goals  OT Goals(current goals can now be found in the care plan section)  Progress towards OT goals: Progressing toward goals  Acute Rehab OT Goals Patient Stated Goal: be able to walk without being worried about falling OT Goal Formulation: With patient Time For Goal Achievement: 01/23/23 Potential to Achieve Goals: Good ADL Goals Pt Will Perform Grooming: Independently;standing Pt Will Perform Upper Body Bathing: Independently;sitting;standing Pt Will Perform Lower Body Bathing: Independently;sit to/from stand Pt Will Perform Upper Body  Dressing: Independently;standing;sitting Pt Will Perform Lower Body Dressing: Independently;sit to/from stand Pt Will Perform Toileting - Clothing Manipulation and hygiene: Independently;sit to/from stand Pt Will Perform Tub/Shower Transfer: Tub transfer;with modified independence;ambulating;shower seat Additional ADL Goal #1: Pt will be able to complete visual tracking and saccadic activies with only min A  Plan      Co-evaluation                 AM-PAC OT "6 Clicks" Daily Activity     Outcome Measure   Help from another person eating meals?: None Help from another person taking care of personal grooming?: A Little Help from another person toileting, which includes using toliet, bedpan, or urinal?: A Little Help from another person bathing (including washing, rinsing, drying)?: A Little Help from another person to put on and taking off regular upper body clothing?: A Little Help from another person to put on and taking off regular lower body clothing?: A Little 6 Click Score: 19    End of Session Equipment Utilized During Treatment: Gait belt;Rolling walker (2 wheels)  OT Visit Diagnosis: Unsteadiness on feet (R26.81);Other  abnormalities of gait and mobility (R26.89);Muscle weakness (generalized) (M62.81);Hemiplegia and hemiparesis Hemiplegia - Right/Left: Left Hemiplegia - dominant/non-dominant: Non-Dominant   Activity Tolerance Patient tolerated treatment well   Patient Left in bed;with bed alarm set;with call bell/phone within reach   Nurse Communication Mobility status        Time: 1610-9604 OT Time Calculation (min): 24 min  Charges: OT General Charges $OT Visit: 1 Visit OT Treatments $Self Care/Home Management : 23-37 mins  Bradd Canary, OTR/L Acute Rehab Services Office: [phone]   Lorre Munroe 01/11/2023, 1:48 PM

## 2023-01-12 DIAGNOSIS — G459 Transient cerebral ischemic attack, unspecified: Secondary | ICD-10-CM

## 2023-01-12 DIAGNOSIS — G931 Anoxic brain damage, not elsewhere classified: Secondary | ICD-10-CM

## 2023-01-12 LAB — GLUCOSE, CAPILLARY
Glucose-Capillary: 129 mg/dL — ABNORMAL HIGH (ref 70–99)
Glucose-Capillary: 123 mg/dL — ABNORMAL HIGH (ref 70–99)
Glucose-Capillary: 96 mg/dL (ref 70–99)

## 2023-01-12 MED ORDER — APIXABAN 5 MG PO TABS: 5.0000 mg | ORAL_TABLET | Freq: Two times a day (BID) | ORAL | 1 refills | Status: DC

## 2023-01-12 MED ORDER — APIXABAN 5 MG PO TABS: 5.0000 mg | ORAL_TABLET | Freq: Two times a day (BID) | ORAL | Status: DC

## 2023-01-12 MED ORDER — ATORVASTATIN CALCIUM 40 MG PO TABS: 40.0000 mg | ORAL_TABLET | Freq: Every day | ORAL | 1 refills | Status: DC

## 2023-01-12 NOTE — Discharge Instructions (Signed)
Take eliquis BID for stroke prevention to due atrial fibrillation. You have also been started on cholesterol medication (atorvastatin 40mg ). Please take these as prescribed. Follow up with [facility] in 1-2 months for headache and TIA .

## 2023-01-12 NOTE — Progress Notes (Signed)
Physical Therapy Treatment Patient Details Name: Courtney Davies MRN: [id number] DOB: 1975/05/26 Today's Date: 01/12/2023   History of Present Illness Courtney Davies is a 47 year old female presented to the ED 9/24 via EMS for acute onset dizziness and blurred vision. CT: negative. 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 Also frustrated with delays and wants to go home. Ambulates 200 feet with supervision using RW. Pt pushes RW to the side out of anger several times in room. Needs reminded to use due to complaints of Lt knee pain and weakness. Recommend family supervision for safety due to behavioral status and poor insight. SW reports insurance will not cover HHPT. Recommend family assist pt to OPPT for continued treatment to maximize strength and function. Will follow and progress during admission.    If plan is discharge home, recommend the following: Assistance with cooking/housework;Assist for transportation;Direct supervision/assist for medications management;Direct supervision/assist for financial management;Supervision due to cognitive status   Can travel by private vehicle      yes  Equipment Recommendations  None recommended by PT    Recommendations for Other Services       Precautions / Restrictions Precautions Precautions: Fall Precaution Comments: SBP <180 Restrictions Weight Bearing Restrictions: No     Mobility  Bed Mobility Overal bed mobility: Modified Independent             General bed mobility comments: no assist    Transfers Overall transfer level: Needs assistance Equipment used: Rolling walker (2 wheels) Transfers: Sit to/from Stand Sit to Stand: Supervision            General transfer comment: Supervison for safety, cues for walker placement and not to push RW off to the side prior to sitting. Continues to move RW, agitated.    Ambulation/Gait Ambulation/Gait assistance: Supervision Gait Distance (Feet): 200 Feet Assistive device: Rolling walker (2 wheels) Gait Pattern/deviations: Step-through pattern, Decreased stride length, Knees buckling, Antalgic Gait velocity: decr Gait velocity interpretation: <1.8 ft/sec, indicate of risk for recurrent falls   General Gait Details: Tolerated greater distance today. RW for light support. Still with mild instability of Lt knee but without full buckle and she is able to adequately support self with RW. Pt pushes RW out of the way in room as she is agitated. Educated on safety awareness and recommendation of RW use.   Stairs             Wheelchair Mobility     Tilt Bed    Modified Rankin (Stroke Patients Only)       Balance Overall balance assessment: Needs assistance Sitting-balance support: No upper extremity supported, Feet supported Sitting balance-Leahy Scale: Good     Standing balance support: No upper extremity supported Standing balance-Leahy Scale: Fair Standing balance comment: improved stability with RW for gait,                            Cognition Arousal: Alert Behavior During Therapy: Agitated Overall Cognitive Status: History of cognitive impairments - at baseline Area of Impairment: Safety/judgement  Safety/Judgement: Decreased awareness of safety, Decreased awareness of deficits              Exercises      General Comments General comments (skin integrity, edema, etc.): Angry that family has not brought her food from a cookout. Also wants to go home, calling her mother asking when she plans to arrive.      Pertinent Vitals/Pain Pain Assessment Pain Assessment: No/denies pain    Home Living                           Prior Function            PT Goals (current goals can now be found in the care plan section) Acute Rehab PT Goals Patient Stated Goal: Get well PT Goal Formulation: With patient Time For Goal Achievement: 01/23/23 Potential to Achieve Goals: Good Progress towards PT goals: Progressing toward goals    Frequency    Min 1X/week      PT Plan      Co-evaluation              AM-PAC PT "6 Clicks" Mobility   Outcome Measure  Help needed turning from your back to your side while in a flat bed without using bedrails?: None Help needed moving from lying on your back to sitting on the side of a flat bed without using bedrails?: None Help needed moving to and from a bed to a chair (including a wheelchair)?: A Little Help needed standing up from a chair using your arms (e.g., wheelchair or bedside chair)?: A Little Help needed to walk in hospital room?: A Little Help needed climbing 3-5 steps with a railing? : A Little 6 Click Score: 20    End of Session Equipment Utilized During Treatment: Gait belt Activity Tolerance: Patient tolerated treatment well Patient left: in chair;with call bell/phone within reach;with chair alarm set (Refused SCD) Nurse Communication: Mobility status PT Visit Diagnosis: Muscle weakness (generalized) (M62.81);Difficulty in walking, not elsewhere classified (R26.2);Other abnormalities of gait and mobility (R26.89)     Time: 1914-7829 PT Time Calculation (min) (ACUTE ONLY): 16 min  Charges:    $Gait Training: 8-22 mins PT General Charges $$ ACUTE PT VISIT: 1 Visit                     Kathlyn Sacramento, PT, DPT [facility]  Rehabilitation Services Physical Therapist Office: [phone] Website: [email or web address]    Berton Mount 01/12/2023, 4:06 PM

## 2023-01-12 NOTE — Progress Notes (Signed)
Patient discharged home. Discharge instructions explained. Patient states understanding of discharge instructions. IV removed and catheter intact. Eliquis Discount card given to the patient. per patient "I am not sure if I am going to get this prescription filled" patient is educated on the importance of taking Eliquis as prescribed. No other concerns noted. Patient has left the floor via wheelchair accompanied by staff. All belongings are with patient.

## 2023-01-12 NOTE — Progress Notes (Signed)
Received referral to assist with HHPT/OT. Unable to arrange [facility] therapy due to pt's insurance. Outpt rehab was ordered. Met with pt and mother. Pt plans to stay with her mother at time of DC. Mother agrees with outpt rehab and she reports she can provide transportation. Mom prefers a rehab facility near home in [place]. Referral sent to [facility] at [facility]. Mom agrees with this rehab center. Nurse provided pt with an Eliquis discount card.

## 2023-01-14 ENCOUNTER — Telehealth: Payer: Self-pay

## 2023-01-14 NOTE — Patient Instructions (Signed)
Visit Information  Thank you for taking time to visit with me today. Please don't hesitate to contact me if I can be of assistance to you before our next scheduled telephone appointment.  Our next appointment is by telephone on 01/25/23 at 9:30am  Following is a copy of your care plan:   Goals Addressed             This Visit's Progress    TOC Care Plan       Current Barriers:  Chronic Disease Management support and education needs related to DMII and CVA  Transportation barriers  RNCM Clinical Goal(s):  Patient will work with the Care Management team over the next 30 days to address Transition of Care Barriers: Transportation take all medications exactly as prescribed and will call provider for medication related questions as evidenced by picking up new meds from pharmacy and begin taking them attend all scheduled medical appointments:   as evidenced by making PCP, cardiology and neuro appt  through collaboration with RN Care manager, provider, and care team.   Interventions: Evaluation of current treatment plan related to  self management and patient's adherence to plan as established by provider  Transitions of Care:  New goal. Doctor Visits  - discussed the importance of doctor visits Arranged PCP follow-up within 12-14 days (Care Guide Scheduled) Post discharge activity limitations prescribed by provider reviewed Reviewed Signs and symptoms of infection Discussed sx mgmt po stroke measures with pt  Reviewed with pt transportation resources([employer]) info on d/c paperwork Patient Goals/Self-Care Activities: Participate in Transition of Care Program/Attend TOC scheduled calls Take all medications as prescribed Attend all scheduled provider appointments Call provider office for new concerns or questions  Monitor blood sugars in the home  Follow Up Plan:  Telephone follow up appointment with care management team member scheduled for:  next week The patient has been  provided with contact information for the care management team and has been advised to call with any health related questions or concerns.          Patient verbalizes understanding of instructions and care plan provided today and agrees to view in MyChart. Active MyChart status and patient understanding of how to access instructions and care plan via MyChart confirmed with patient.     Telephone follow up appointment with care management team member scheduled for: The patient has been provided with contact information for the care management team and has been advised to call with any health related questions or concerns.   Please call the care guide team at [phone] if you need to cancel or reschedule your appointment.   Please call the Suicide and Crisis Lifeline: 988 call the [place] National Suicide Prevention Lifeline: 279-399-3747 or TTY: 816-317-2142 TTY 9154436988) to talk to a trained counselor call 1-800-273-TALK (toll free, 24 hour hotline) if you are experiencing a Mental Health or Behavioral Health Crisis or need someone to talk to.  Antionette Fairy, RN,BSN,CCM RN Care Manager  Transitions of Care  [facility] - Population Health  Direct Phone: [phone] Toll Free: [phone] Fax: [phone]

## 2023-01-14 NOTE — Transitions of Care (Post Inpatient/ED Visit) (Signed)
01/14/2023  Name: Courtney Davies MRN: [id number] DOB: [birth year]  Today's TOC FU Call Status: Today's TOC FU Call Status:: Successful TOC FU Call Completed TOC FU Call Complete Date: 01/14/23 Patient's Name and Date of Birth confirmed.  Transition Care Management Follow-up Telephone Call Date of Discharge: 12/12/22 Discharge Facility: [facility]) Type of Discharge: Inpatient Admission Primary Inpatient Discharge Diagnosis:: "CVA" How have you been since you were released from the hospital?: Better (Pt pleased to report she has been "walking better", appetite fair-trying to adhere to diet restrictions) Any questions or concerns?: Yes Patient Questions/Concerns:: pt wanting to know if headaches after stroke common and what to do Patient Questions/Concerns Addressed: Other: (discussed post stroke sx mgmt with pt-she has Tylenol listed on med list-has not tried any-will try some-she is aware to alert MD fo any worsening and/or unresolved sxs)  Items Reviewed: Did you receive and understand the discharge instructions provided?: Yes Medications obtained,verified, and reconciled?: Partial Review Completed Reason for Partial Mediation Review: pt states she does not know her meds- her mom manages her meds and not in the home at present-states they are going to pick up new meds today Any new allergies since your discharge?: No Dietary orders reviewed?: Yes Type of Diet Ordered:: low salt/heart healthy/carb modified Do you have support at home?: Yes People in Home: parent(s) Name of Support/Comfort Primary Source: temporarily saying with her mom while she recovers  Medications Reviewed Today: Medications Reviewed Today     Reviewed by Charlyn Minerva, RN (Registered Nurse) on 01/14/23 at 1006  Med List Status: <None>   Medication Order Taking? Sig Documenting Provider Last Dose Status Informant  ACCU-CHEK GUIDE test strip [id number] No  [provider] Taking Active  Self, Mother, Pharmacy Records  acetaminophen (TYLENOL) 500 MG tablet [id number] No Take 1 tablet (500 mg total) by mouth every 6 (six) hours as needed. Saddie Benders, PA-C 01/06/2023 Active Self, Mother, Pharmacy Records  albuterol (VENTOLIN HFA) 108 (90 Base) MCG/ACT inhaler [id number] No Inhale 2 puffs into the lungs every 6 (six) hours as needed for wheezing or shortness of breath. [name], PA-C unknown Active Self, Mother, Pharmacy Records  amiodarone (PACERONE) 200 MG tablet [id number] No Take 200 mg by mouth daily. [provider] 01/08/2023 Active Self, Mother, Pharmacy Records  apixaban (ELIQUIS) 5 MG TABS tablet [id number]  Take 1 tablet (5 mg total) by mouth 2 (two) times daily. Elmer Picker, NP  Active   atorvastatin (LIPITOR) 40 MG tablet [id number]  Take 1 tablet (40 mg total) by mouth daily. Elmer Picker, NP  Active   BD PEN NEEDLE NANO 2ND GEN 32G X 4 MM MISC [id number] No 3 (three) times daily. as directed [provider] Taking Active Self, Mother, Pharmacy Records  Blood Glucose Monitoring Suppl (ACCU-CHEK GUIDE ME) w/Device KIT [id number] No SMARTSIG:Via Meter [provider] Taking Active Self, Mother, Pharmacy Records  carvedilol (COREG) 6.25 MG tablet [id number] No TAKE 1 TABLET BY MOUTH TWICE DAILY [name], [name], NP 01/08/2023 Active Self, Mother, Pharmacy Records  diclofenac Sodium (VOLTAREN) 1 % GEL [id number] No Apply 4 g topically 4 (four) times daily. Apply to left knee [name], Monina C, NP Past Week Active Self, Mother, Pharmacy Records  empagliflozin (JARDIANCE) 10 MG TABS tablet [id number] No Take 1 tablet (10 mg total) by mouth every morning. [name], NP 01/08/2023 Active Self, Mother, Pharmacy Records  hydrOXYzine (VISTARIL) 25 MG capsule [id number] No Take 1 capsule (25 mg total) by mouth  at bedtime as needed. [name], NP 01/07/2023 Active Self, Mother, Pharmacy Records  Insulin Syringe-Needle U-100 (INSULIN SYRINGE  .5CC/31GX5/16") 31G X 5/16" 0.5 ML MISC [id number] No 3 (three) times daily. [provider] Taking Active Self, Mother, Pharmacy Records  losartan (COZAAR) 25 MG tablet [id number] No TAKE 1 TABLET BY MOUTH DAILY [name], [name], NP 01/08/2023 Active Self, Mother, Pharmacy Records  metformin (FORTAMET) 500 MG (OSM) 24 hr tablet [id number] No Take 1,000 mg by mouth daily with breakfast. [provider] 01/08/2023 Active Self, Mother, Pharmacy Records  metroNIDAZOLE (METROGEL) 0.75 % vaginal gel [id number] No Place 1 Applicatorful vaginally daily. For 5 days [provider] Not Taking Active Self, Mother, Pharmacy Records  OZEMPIC, 0.25 OR 0.5 MG/DOSE, 2 MG/3ML SOPN [id number] No Inject 0.5 mg into the skin once a week. [provider] 01/06/2023 Active Self, Mother, Pharmacy Records  QUEtiapine (SEROQUEL) 25 MG tablet [id number] No TAKE 1 TABLET(25 MG) BY MOUTH AT BEDTIME  Patient taking differently: Take 25 mg by mouth at bedtime.   [name], MD 01/07/2023 Active Self, Mother, Pharmacy Records  rivastigmine (EXELON) 3 MG capsule [id number] No TAKE 1 CAPSULE(3 MG) BY MOUTH TWICE DAILY  Patient taking differently: Take 3 mg by mouth 2 (two) times daily.   [name], MD 01/08/2023 Active Self, Mother, Pharmacy Records  sertraline (ZOLOFT) 50 MG tablet [id number] No Take 1 tablet (50 mg total) by mouth daily. [name], [name], NP 01/08/2023 Active Self, Mother, Pharmacy Records            Home Care and Equipment/Supplies: Were Home Health Services Ordered?: NA (Pt has been set up for outpt rehab-awre to expect a call from them and if not to call and follow up in 3 days) Any new equipment or medical supplies ordered?: NA  Functional Questionnaire: Do you need assistance with bathing/showering or dressing?: No Do you need assistance with meal preparation?: No Do you need assistance with eating?: No Do you have difficulty maintaining continence: No Do  you need assistance with getting out of bed/getting out of a chair/moving?: No Do you have difficulty managing or taking your medications?: Yes (mom assists)  Follow up appointments reviewed: PCP Follow-up appointment confirmed?: Yes Date of PCP follow-up appointment?: 01/24/23 Follow-up Provider: [facility] Follow-up appointment confirmed?: No Reason Specialist Follow-Up Not Confirmed: Patient has Specialist Provider Number and will Call for Appointment (discussed with pt about calling and making cardiology and neuro appt-she will do so when her mom returns home to coordinate with her schedule since she is taking her to appts) Do you need transportation to your follow-up appointment?: No (pt states her mom should be able to take her to appts) Do you understand care options if your condition(s) worsen?: Yes-patient verbalized understanding  SDOH Interventions Today    Flowsheet Row Most Recent Value  SDOH Interventions   Transportation Interventions Intervention Not Indicated  [pt states her mom will take her to appts or she has Medicaid transportation-she was given info in hospital and has number to call]       Goals Addressed             This Visit's Progress    TOC Care Plan       Current Barriers:  Chronic Disease Management support and education needs related to DMII and CVA  Transportation barriers  RNCM Clinical Goal(s):  Patient will work with the Care Management team over the next 30 days to address Transition of  Care Barriers: Transportation take all medications exactly as prescribed and will call provider for medication related questions as evidenced by picking up new meds from pharmacy and begin taking them attend all scheduled medical appointments:   as evidenced by making PCP, cardiology and neuro appt  through collaboration with RN Care manager, provider, and care team.   Interventions: Evaluation of current treatment plan related to  self  management and patient's adherence to plan as established by provider  Transitions of Care:  New goal. Doctor Visits  - discussed the importance of doctor visits Arranged PCP follow-up within 12-14 days (Care Guide Scheduled) Post discharge activity limitations prescribed by provider reviewed Reviewed Signs and symptoms of infection Discussed sx mgmt po stroke measures with pt  Reviewed with pt transportation resources([employer]) info on d/c paperwork Patient Goals/Self-Care Activities: Participate in Transition of Care Program/Attend TOC scheduled calls Take all medications as prescribed Attend all scheduled provider appointments Call provider office for new concerns or questions  Monitor blood sugars in the home  Follow Up Plan:  Telephone follow up appointment with care management team member scheduled for:  next week The patient has been provided with contact information for the care management team and has been advised to call with any health related questions or concerns.          Antionette Fairy, RN,BSN,CCM RN Care Manager  Transitions of Care  [facility] - Population Health  Direct Phone: [phone] Toll Free: [phone] Fax: [phone]

## 2023-01-15 ENCOUNTER — Ambulatory Visit (INDEPENDENT_AMBULATORY_CARE_PROVIDER_SITE_OTHER): Payer: Medicaid Other | Admitting: Sports Medicine

## 2023-01-15 ENCOUNTER — Encounter: Payer: Self-pay | Admitting: Sports Medicine

## 2023-01-15 VITALS — BP 128/82 | HR 78 | Temp 97.3°F | Resp 17 | Ht 66.0 in | Wt 225.2 lb

## 2023-01-15 DIAGNOSIS — I10 Essential (primary) hypertension: Secondary | ICD-10-CM

## 2023-01-15 DIAGNOSIS — I639 Cerebral infarction, unspecified: Secondary | ICD-10-CM | POA: Diagnosis not present

## 2023-01-15 DIAGNOSIS — Z09 Encounter for follow-up examination after completed treatment for conditions other than malignant neoplasm: Secondary | ICD-10-CM | POA: Diagnosis not present

## 2023-01-15 DIAGNOSIS — I480 Paroxysmal atrial fibrillation: Secondary | ICD-10-CM | POA: Diagnosis not present

## 2023-01-15 NOTE — Progress Notes (Addendum)
Careteam: Patient Care Team: [name], NP as PCP - General (Adult Health Nurse Practitioner) [name], MD as PCP - Cardiology (Cardiology) [name], MD as PCP - Electrophysiology (Cardiology) [place], [place] (Family Medicine)  PLACE OF SERVICE:  [facility] CLINIC  Advanced Directive information    Allergies  Allergen Reactions   Latex Hives   Other Other (See Comments), Anaphylaxis, Swelling and Hives    Hair glue causes throat to close and hives "glue" Hair glue causes throat to close    Codeine Nausea And Vomiting    Was on an empty stomach.   Nitrofurantoin Rash    Chief Complaint  Patient presents with   Transitions Of Care    [facility] 01/12/2023     HPI: Patient is a 47 y.o. female is here for Hospital follow up visit.   Pt states she needs a letter stating that she was in the hospital. She smokes weed, when asked patient to quit smoking weed, she replied she knows but she wants to smoke the last packet she got   C/o intermittent dizziness like room spinning vertigo  Reports blurring of vision  No problems with her balance No dysarthria  Denies breathing problems  C/o acid reflux   Pt states she did not pick eliquis as she could not afford   Date of Admission: 01/08/2023 Date of Discharge: 01/12/2023  DISCHARGE PRIMARY DIAGNOSIS:   Stroke-like symptoms s/p TNK, etiology: TIA given PAF vs complicated migraine with dizziness and blurry vision.     Secondary Diagnoses: Hypertension Hyperlipidemia Diabetes Type II Atrial fibrillation Hx of VF cardiac arrest s/p ICD placement   Medication List       TAKE these medications     Accu-Chek Guide Me w/Device Kit SMARTSIG:Via Meter    Accu-Chek Guide test strip Generic drug: glucose blood    acetaminophen 500 MG tablet Commonly known as: TYLENOL Take 1 tablet (500 mg total) by mouth every 6 (six) hours as needed.    amiodarone 200 MG tablet Commonly known as:  PACERONE Take 200 mg by mouth daily.    apixaban 5 MG Tabs tablet Commonly known as: ELIQUIS Take 1 tablet (5 mg total) by mouth 2 (two) times daily.    atorvastatin 40 MG tablet Commonly known as: LIPITOR Take 1 tablet (40 mg total) by mouth daily. Start taking on: January 13, 2023    BD Pen Needle Nano 2nd Gen 32G X 4 MM Misc Generic drug: Insulin Pen Needle 3 (three) times daily. as directed    carvedilol 6.25 MG tablet Commonly known as: COREG TAKE 1 TABLET BY MOUTH TWICE DAILY    diclofenac Sodium 1 % Gel Commonly known as: Voltaren Apply 4 g topically 4 (four) times daily. Apply to left knee    empagliflozin 10 MG Tabs tablet Commonly known as: Jardiance Take 1 tablet (10 mg total) by mouth every morning.    hydrOXYzine 25 MG capsule Commonly known as: VISTARIL Take 1 capsule (25 mg total) by mouth at bedtime as needed.    INSULIN SYRINGE .5CC/31GX5/16" 31G X 5/16" 0.5 ML Misc 3 (three) times daily.    losartan 25 MG tablet Commonly known as: COZAAR TAKE 1 TABLET BY MOUTH DAILY    metformin 500 MG (OSM) 24 hr tablet Commonly known as: FORTAMET Take 1,000 mg by mouth daily with breakfast.    metroNIDAZOLE 0.75 % vaginal gel Commonly known as: METROGEL Place 1 Applicatorful vaginally daily. For 5 days  Ozempic (0.25 or 0.5 MG/DOSE) 2 MG/3ML Sopn Generic drug: Semaglutide(0.25 or 0.5MG /DOS) Inject 0.5 mg into the skin once a week.    QUEtiapine 25 MG tablet Commonly known as: SEROQUEL TAKE 1 TABLET(25 MG) BY MOUTH AT BEDTIME What changed: See the new instructions.    rivastigmine 3 MG capsule Commonly known as: EXELON TAKE 1 CAPSULE(3 MG) BY MOUTH TWICE DAILY What changed: See the new instructions.    sertraline 50 MG tablet Commonly known as: ZOLOFT Take 1 tablet (50 mg total) by mouth daily.    Ventolin HFA 108 (90 Base) MCG/ACT inhaler Generic drug: albuterol Inhale 2 puffs into the lungs every 6 (six) hours as needed for wheezing or  shortness of breath.     HOSPITAL COURSE 47 y.o. patient with history of anoxic brain injury after episode of V-fib in July 2022 status post ICD placement and atrial fibrillation, hypertension and diabetes was admitted with acute onset dizziness and blurred vision.   Stroke-like symptoms s/p TNK, etiology: TIA given PAF vs complicated migraine with vertigo and blurry vision.   CT head 9/24: No acute intracranial hemorrhage or evidence of acute large vessel territory infarct.  Aspects score is 10. CTA head & neck 9/24: Negative CTA.  No occlusion, stenosis, or irregularity of major arteries in the head and neck. Repeat CT head 24 hours post TNK 9/25: No evidence of acute intracranial abnormality.  Minor paranasal sinus disease. MRI unremarkable  2D Echo LVEF 60 to 65% LDL 104 HgbA1c 7.3 VTE prophylaxis - SCDs  No antithrombotic prior to admission, now on eliquis 5mg  bid Therapy recommendations: Home with Outpatient Therapy Disposition: Discharge home   PAF  Transient AF during admission following V-fib arrest in September 2022 PAF was found on ICD interrogation Followed by [facility] health heart care Dr Annia Friendly CHA2DS2-VASc of 5 now Discussed with Dr. Annia Friendly, will start eliquis 5mg  BID   Hypertension Home meds: Losartan Stable Long term BP goal: Normotension   Hyperlipidemia Home meds: None LDL 104, goal < 70 Add atorvastatin 40 mg PO daily   Continue statin at discharge   Diabetes type II Uncontrolled Hyperglycemia Home meds: Metformin, Ozempic Follow up note from 12/06/22 indicates patient is supposed to be taking Jardiance, SSI, and trulicity  HgbA1c 7.1, goal < 7.0 CBGs SSI Recommend close follow-up with PCP for better DM control   Substance Abuse Patient uses marijuana UDS positive for  [facility]  Cessation education provided Review of Systems:  Review of Systems  Constitutional:  Negative for chills and fever.  HENT:  Negative for congestion and sore throat.   Eyes:   Positive for blurred vision. Negative for double vision.  Respiratory:  Negative for cough, sputum production and shortness of breath.   Cardiovascular:  Negative for chest pain, palpitations and leg swelling.  Gastrointestinal:  Negative for abdominal pain, heartburn and nausea.  Genitourinary:  Negative for dysuria, frequency and hematuria.  Musculoskeletal:  Negative for falls and myalgias.  Neurological:  Positive for dizziness. Negative for sensory change and focal weakness.    Past Medical History:  Diagnosis Date   Anemia 09/2015   Anoxic brain injury (HCC)    Arthritis    "knees" (04/23/2017)   Asthma    "teens; went away; came back" (04/23/2017)   B12 deficiency anemia 04/23/2017   Cardiac arrest (HCC)    Chronic bronchitis (HCC)    Chronic low back pain with sciatica    Chronic lower back pain    Diabetes mellitus without complication (HCC)  Diabetes type 2, controlled (HCC)    Elevated ferritin level    Fatty liver    GERD (gastroesophageal reflux disease)    GERD with stricture    Headache    "1-2/wk" (04/23/2017)   History of blood transfusion "plenty"   "related to anemia" (04/23/2017)   Hypertension    Inguinal hernia    Low back pain    Migraine    "1-2/month" (04/23/2017)   OA (osteoarthritis) of knee--left    Paroxysmal atrial fibrillation (HCC)    Sub-clinical -seen on device?, elevated CHADSVASC   Symptomatic anemia 04/23/2017   Vitamin B 12 deficiency    Past Surgical History:  Procedure Laterality Date   ABDOMINAL HYSTERECTOMY  YRS AGO   COMPLETE   CESAREAN SECTION  1998; 2002   ESOPHAGEAL DILATION  02/2020   by Dr Lanae Boast   INGUINAL HERNIA REPAIR Bilateral 1980s    "total of 4 surgeries" (04/23/2017)   IR GASTROSTOMY TUBE MOD SED  12/13/2020   KNEE ARTHROSCOPY WITH MEDIAL MENISECTOMY Left 01/30/2018   Procedure: LEFT KNEE ARTHROSCOPY WITH PARTIAL LATERAL MENISCECTOMY;  Surgeon: Kathryne Hitch, MD;  Location: [facility];  Service: Orthopedics;   Laterality: Left;   KNEE CARTILAGE SURGERY Left    SUBQ ICD IMPLANT N/A 09/13/2021   Procedure: SUBQ ICD IMPLANT;  Surgeon: [name], MD;  Location: [facility] INVASIVE CV LAB;  Service: Cardiovascular;  Laterality: N/A;   TUBAL LIGATION  2002   Social History:   reports that she has quit smoking. Her smoking use included cigarettes. She has never used smokeless tobacco. She reports current alcohol use. She reports current drug use. Drug: Marijuana.  Family History  Problem Relation Age of Onset   Stroke Mother    Diabetes Mother    Diabetes Father    Stroke Maternal Grandmother    Diabetes Maternal Grandmother    Anemia Paternal Grandmother    Valvular heart disease Paternal Grandmother    Hypertension Mother    Prostate cancer Maternal Uncle        ? intestinal also    Medications: Patient's Medications  New Prescriptions   No medications on file  Previous Medications   ACCU-CHEK GUIDE TEST STRIP       ACETAMINOPHEN (TYLENOL) 500 MG TABLET    Take 1 tablet (500 mg total) by mouth every 6 (six) hours as needed.   ALBUTEROL (VENTOLIN HFA) 108 (90 BASE) MCG/ACT INHALER    Inhale 2 puffs into the lungs every 6 (six) hours as needed for wheezing or shortness of breath.   AMIODARONE (PACERONE) 200 MG TABLET    Take 200 mg by mouth daily.   APIXABAN (ELIQUIS) 5 MG TABS TABLET    Take 1 tablet (5 mg total) by mouth 2 (two) times daily.   ATORVASTATIN (LIPITOR) 40 MG TABLET    Take 1 tablet (40 mg total) by mouth daily.   BD PEN NEEDLE NANO 2ND GEN 32G X 4 MM MISC    3 (three) times daily. as directed   BLOOD GLUCOSE MONITORING SUPPL (ACCU-CHEK GUIDE ME) W/DEVICE KIT    SMARTSIG:Via Meter   CARVEDILOL (COREG) 6.25 MG TABLET    TAKE 1 TABLET BY MOUTH TWICE DAILY   DICLOFENAC SODIUM (VOLTAREN) 1 % GEL    Apply 4 g topically 4 (four) times daily. Apply to left knee   EMPAGLIFLOZIN (JARDIANCE) 10 MG TABS TABLET    Take 1 tablet (10 mg total) by mouth every morning.   HYDROXYZINE  (VISTARIL)  25 MG CAPSULE    Take 1 capsule (25 mg total) by mouth at bedtime as needed.   INSULIN SYRINGE-NEEDLE U-100 (INSULIN SYRINGE .5CC/31GX5/16") 31G X 5/16" 0.5 ML MISC    3 (three) times daily.   LOSARTAN (COZAAR) 25 MG TABLET    TAKE 1 TABLET BY MOUTH DAILY   METFORMIN (FORTAMET) 500 MG (OSM) 24 HR TABLET    Take 1,000 mg by mouth daily with breakfast.   METRONIDAZOLE (METROGEL) 0.75 % VAGINAL GEL    Place 1 Applicatorful vaginally daily. For 5 days   OZEMPIC, 0.25 OR 0.5 MG/DOSE, 2 MG/3ML SOPN    Inject 0.5 mg into the skin once a week.   QUETIAPINE (SEROQUEL) 25 MG TABLET    TAKE 1 TABLET(25 MG) BY MOUTH AT BEDTIME   RIVASTIGMINE (EXELON) 3 MG CAPSULE    TAKE 1 CAPSULE(3 MG) BY MOUTH TWICE DAILY   SERTRALINE (ZOLOFT) 50 MG TABLET    Take 1 tablet (50 mg total) by mouth daily.  Modified Medications   No medications on file  Discontinued Medications   No medications on file    Physical Exam:  Vitals:   01/15/23 0944  BP: 128/82  Pulse: 78  Resp: 17  Temp: (!) 97.3 F (36.3 C)  SpO2: 99%  Weight: 225 lb 3.2 oz (102.2 kg)  Height: 5\' 6"  (1.676 m)   Body mass index is 36.35 kg/m. Wt Readings from Last 3 Encounters:  01/15/23 225 lb 3.2 oz (102.2 kg)  01/08/23 233 lb 4 oz (105.8 kg)  12/06/22 242 lb (109.8 kg)    Physical Exam Constitutional:      Appearance: Normal appearance.  HENT:     Head: Normocephalic and atraumatic.  Cardiovascular:     Rate and Rhythm: Normal rate and regular rhythm.     Heart sounds: No murmur heard. Pulmonary:     Effort: Pulmonary effort is normal. No respiratory distress.     Breath sounds: Normal breath sounds. No wheezing.  Abdominal:     General: Bowel sounds are normal. There is no distension.     Tenderness: There is no abdominal tenderness. There is no guarding or rebound.  Musculoskeletal:        General: No swelling or tenderness.  Skin:    General: Skin is dry.  Neurological:     Mental Status: She is alert. Mental  status is at baseline.     Sensory: No sensory deficit.     Motor: No weakness.     Comments: Gait normal  Rombergs positive  Strength slightly reduced in left hand but has good grip strength in left hand  not sure if she is malingering Sensations intact  Finger nose negative Eomi      Labs reviewed: Basic Metabolic Panel: Recent Labs    12/06/22 1052 01/08/23 1117 01/10/23 1115  NA 136 138 137  K 4.1 3.5 4.0  CL 104 106 109  CO2 21 20* 19*  GLUCOSE 127 100* 104*  BUN 11 10 13   CREATININE 0.91 0.76 0.83  CALCIUM 9.1 9.0 9.2  TSH 0.44  --   --    Liver Function Tests: Recent Labs    02/08/22 1104 12/06/22 1052 01/08/23 1117  AST 13 14 22   ALT 11 10 19   ALKPHOS  --   --  97  BILITOT 0.4 0.2 0.4  PROT 7.3 7.1 7.0  ALBUMIN  --   --  3.6   No results for input(s): "LIPASE", "AMYLASE" in the  last 8760 hours. No results for input(s): "AMMONIA" in the last 8760 hours. CBC: Recent Labs    02/08/22 1104 12/06/22 1052 01/08/23 0928 01/10/23 1115  WBC 5.5 5.9 5.7 5.2  NEUTROABS 2,728 2,755 2.3  --   HGB 12.8 13.5 13.9 14.9  HCT 41.1 44.4 44.3 47.5*  MCV 73.4* 73.3* 72.6* 71.8*  PLT 262 263 227 282   Lipid Panel: Recent Labs    01/09/23 0454  CHOL 159  HDL 38*  LDLCALC 104*  TRIG 85  CHOLHDL 4.2   TSH: Recent Labs    12/06/22 1052  TSH 0.44   A1C: Lab Results  Component Value Date   HGBA1C 7.1 (H) 01/09/2023     Assessment/Plan  Hospital discharge follow up visit  1. Cerebrovascular accident (CVA), unspecified mechanism (HCC)  Instructed patient to follow up with neurology  Informed patient about the warning signs of stroke and informed to go to ED if she develops any - Ambulatory referral to Neurology  2. Essential hypertension Bp at goal  Cont with the same  3. Paroxysmal A-fib (HCC)  Gave some samples on eliquis Instructed patient to fill out patient assistance forms to get eliquis    No follow-ups on file.: work note provided to  patient   Follow-up in [facility] in 4 weeks, office to schedule an appointment.

## 2023-01-15 NOTE — Patient Instructions (Signed)
Follow up with PCP , Neurology

## 2023-01-16 NOTE — Addendum Note (Signed)
Addended by: [name] on: 01/16/2023 10:14 AM   Modules accepted: Level of Service

## 2023-01-23 ENCOUNTER — Ambulatory Visit: Payer: Medicaid Other | Attending: Neurology | Admitting: Physical Therapy

## 2023-01-24 ENCOUNTER — Ambulatory Visit (INDEPENDENT_AMBULATORY_CARE_PROVIDER_SITE_OTHER): Payer: Medicaid Other | Admitting: Orthopedic Surgery

## 2023-01-24 ENCOUNTER — Encounter: Payer: Self-pay | Admitting: Orthopedic Surgery

## 2023-01-24 VITALS — BP 130/86 | HR 65 | Temp 96.4°F | Resp 17 | Ht 66.0 in | Wt 232.0 lb

## 2023-01-24 DIAGNOSIS — I639 Cerebral infarction, unspecified: Secondary | ICD-10-CM

## 2023-01-24 DIAGNOSIS — Z79899 Other long term (current) drug therapy: Secondary | ICD-10-CM | POA: Diagnosis not present

## 2023-01-24 DIAGNOSIS — E785 Hyperlipidemia, unspecified: Secondary | ICD-10-CM | POA: Diagnosis not present

## 2023-01-24 DIAGNOSIS — E1169 Type 2 diabetes mellitus with other specified complication: Secondary | ICD-10-CM

## 2023-01-24 MED ORDER — ATORVASTATIN CALCIUM 40 MG PO TABS
40.0000 mg | ORAL_TABLET | Freq: Every day | ORAL | 1 refills | Status: DC
Start: 2023-01-24 — End: 2023-03-07

## 2023-01-24 MED ORDER — APIXABAN 5 MG PO TABS: 5.0000 mg | ORAL_TABLET | Freq: Two times a day (BID) | ORAL | 5 refills | Status: DC

## 2023-01-24 NOTE — Patient Instructions (Addendum)
Please start taking atorvastatin > your cholesterol medication  6 weeks worth of Eliquis (blood thinner) given to you today> take one tablet twice daily> take pills 12 hours apart  Please call cardiology and ask for Eliquis coupon/copay card > [phone]

## 2023-01-24 NOTE — Progress Notes (Signed)
Careteam: Patient Care Team: Courtney Heir, NP as PCP - General (Adult Health Nurse Practitioner) [name], MD as PCP - Cardiology (Cardiology) [name], MD as PCP - Electrophysiology (Cardiology) [place], [place], PA-C [facility])  Seen by: Hazle Nordmann, AGNP-C  PLACE OF SERVICE:  [facility] CLINIC  Advanced Directive information    Allergies  Allergen Reactions   Latex Hives   Other Other (See Comments), Anaphylaxis, Swelling and Hives    Hair glue causes throat to close and hives "glue" Hair glue causes throat to close    Codeine Nausea And Vomiting    Was on an empty stomach.   Nitrofurantoin Rash    Chief Complaint  Patient presents with   Hospitalization Follow-up    Hospital follow up 01/08/23-01/12/23     HPI: Patient is a 47 y.o. female seen today for acute visit due to medication management.   Hospitalized 09/24-09/28 due to CVA> given TNK.   10/01 TOC with Dr. Jacquenette Shone. She has not started Eliquis or statin due to cost. She also missed appointment with neurology. 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   Today, she is very overwhelmed with medications she should be taking. She does not understand why she was placed on a blood thinner or cholesterol medication. Review of medications done with patient, including written instructions and explanation.     Review of Systems:  Review of Systems  Constitutional: Negative.   HENT: Negative.    Eyes: Negative.   Respiratory:  Negative for cough, shortness of breath and wheezing.   Cardiovascular:  Negative for chest pain and leg swelling.  Gastrointestinal: Negative.   Genitourinary: Negative.   Musculoskeletal: Negative.   Skin: Negative.   Neurological:  Positive for sensory change. Negative for dizziness, weakness and headaches.  Psychiatric/Behavioral:  Positive for depression and memory loss. The patient is not nervous/anxious and does not have  insomnia.     Past Medical History:  Diagnosis Date   Anemia 09/2015   Anoxic brain injury (HCC)    Arthritis    "knees" (04/23/2017)   Asthma    "teens; went away; came back" (04/23/2017)   B12 deficiency anemia 04/23/2017   Cardiac arrest (HCC)    Chronic bronchitis (HCC)    Chronic low back pain with sciatica    Chronic lower back pain    Diabetes mellitus without complication (HCC)    Diabetes type 2, controlled (HCC)    Elevated ferritin level    Fatty liver    GERD (gastroesophageal reflux disease)    GERD with stricture    Headache    "1-2/wk" (04/23/2017)   History of blood transfusion "plenty"   "related to anemia" (04/23/2017)   Hypertension    Inguinal hernia    Low back pain    Migraine    "1-2/month" (04/23/2017)   OA (osteoarthritis) of knee--left    Paroxysmal atrial fibrillation (HCC)    Sub-clinical -seen on device?, elevated CHADSVASC   Symptomatic anemia 04/23/2017   Vitamin B 12 deficiency    Past Surgical History:  Procedure Laterality Date   ABDOMINAL HYSTERECTOMY  YRS AGO   COMPLETE   CESAREAN SECTION  1998; 2002   ESOPHAGEAL DILATION  02/2020   by Dr Lanae Boast   INGUINAL HERNIA REPAIR Bilateral 1980s    "total of 4 surgeries" (04/23/2017)   IR GASTROSTOMY TUBE MOD SED  12/13/2020   KNEE ARTHROSCOPY WITH MEDIAL MENISECTOMY Left 01/30/2018   Procedure:  LEFT KNEE ARTHROSCOPY WITH PARTIAL LATERAL MENISCECTOMY;  Surgeon: Kathryne Hitch, MD;  Location: [facility];  Service: Orthopedics;  Laterality: Left;   KNEE CARTILAGE SURGERY Left    SUBQ ICD IMPLANT N/A 09/13/2021   Procedure: SUBQ ICD IMPLANT;  Surgeon: [name], MD;  Location: [facility] INVASIVE CV LAB;  Service: Cardiovascular;  Laterality: N/A;   TUBAL LIGATION  2002   Social History:   reports that she has quit smoking. Her smoking use included cigarettes. She has never used smokeless tobacco. She reports current alcohol use. She reports current drug use. Drug: Marijuana.  Family History   Problem Relation Age of Onset   Stroke Mother    Diabetes Mother    Diabetes Father    Stroke Maternal Grandmother    Diabetes Maternal Grandmother    Anemia Paternal Grandmother    Valvular heart disease Paternal Grandmother    Hypertension Mother    Prostate cancer Maternal Uncle        ? intestinal also    Medications: Patient's Medications  New Prescriptions   No medications on file  Previous Medications   ACCU-CHEK GUIDE TEST STRIP       ACETAMINOPHEN (TYLENOL) 500 MG TABLET    Take 1 tablet (500 mg total) by mouth every 6 (six) hours as needed.   ALBUTEROL (VENTOLIN HFA) 108 (90 BASE) MCG/ACT INHALER    Inhale 2 puffs into the lungs every 6 (six) hours as needed for wheezing or shortness of breath.   AMIODARONE (PACERONE) 200 MG TABLET    Take 200 mg by mouth daily.   APIXABAN (ELIQUIS) 5 MG TABS TABLET    Take 1 tablet (5 mg total) by mouth 2 (two) times daily.   ATORVASTATIN (LIPITOR) 40 MG TABLET    Take 1 tablet (40 mg total) by mouth daily.   BD PEN NEEDLE NANO 2ND GEN 32G X 4 MM MISC    3 (three) times daily. as directed   BLOOD GLUCOSE MONITORING SUPPL (ACCU-CHEK GUIDE ME) W/DEVICE KIT    SMARTSIG:Via Meter   CARVEDILOL (COREG) 6.25 MG TABLET    TAKE 1 TABLET BY MOUTH TWICE DAILY   DICLOFENAC SODIUM (VOLTAREN) 1 % GEL    Apply 4 g topically 4 (four) times daily. Apply to left knee   EMPAGLIFLOZIN (JARDIANCE) 10 MG TABS TABLET    Take 1 tablet (10 mg total) by mouth every morning.   HYDROXYZINE (VISTARIL) 25 MG CAPSULE    Take 1 capsule (25 mg total) by mouth at bedtime as needed.   INSULIN SYRINGE-NEEDLE U-100 (INSULIN SYRINGE .5CC/31GX5/16") 31G X 5/16" 0.5 ML MISC    3 (three) times daily.   LOSARTAN (COZAAR) 25 MG TABLET    TAKE 1 TABLET BY MOUTH DAILY   METFORMIN (FORTAMET) 500 MG (OSM) 24 HR TABLET    Take 1,000 mg by mouth daily with breakfast.   METRONIDAZOLE (METROGEL) 0.75 % VAGINAL GEL    Place 1 Applicatorful vaginally daily. For 5 days   OZEMPIC, 0.25 OR  0.5 MG/DOSE, 2 MG/3ML SOPN    Inject 0.5 mg into the skin once a week.   QUETIAPINE (SEROQUEL) 25 MG TABLET    TAKE 1 TABLET(25 MG) BY MOUTH AT BEDTIME   RIVASTIGMINE (EXELON) 3 MG CAPSULE    TAKE 1 CAPSULE(3 MG) BY MOUTH TWICE DAILY   SERTRALINE (ZOLOFT) 50 MG TABLET    Take 1 tablet (50 mg total) by mouth daily.  Modified Medications   No medications on file  Discontinued Medications   No medications on file    Physical Exam:  There were no vitals filed for this visit. There is no height or weight on file to calculate BMI. Wt Readings from Last 3 Encounters:  01/15/23 225 lb 3.2 oz (102.2 kg)  01/08/23 233 lb 4 oz (105.8 kg)  12/06/22 242 lb (109.8 kg)    Physical Exam Vitals reviewed.  Constitutional:      General: She is not in acute distress. HENT:     Head: Normocephalic.  Eyes:     General:        Right eye: No discharge.        Left eye: No discharge.  Cardiovascular:     Rate and Rhythm: Normal rate and regular rhythm.     Pulses: Normal pulses.     Heart sounds: Normal heart sounds.  Pulmonary:     Effort: Pulmonary effort is normal. No respiratory distress.     Breath sounds: Normal breath sounds. No wheezing.  Abdominal:     General: Bowel sounds are normal.     Palpations: Abdomen is soft.  Musculoskeletal:     Cervical back: Neck supple.     Right lower leg: No edema.     Left lower leg: No edema.  Skin:    General: Skin is warm.     Capillary Refill: Capillary refill takes less than 2 seconds.  Neurological:     General: No focal deficit present.     Mental Status: She is alert and oriented to person, place, and time.  Psychiatric:        Mood and Affect: Mood normal.     Labs reviewed: Basic Metabolic Panel: Recent Labs    12/06/22 1052 01/08/23 1117 01/10/23 1115  NA 136 138 137  K 4.1 3.5 4.0  CL 104 106 109  CO2 21 20* 19*  GLUCOSE 127 100* 104*  BUN 11 10 13   CREATININE 0.91 0.76 0.83  CALCIUM 9.1 9.0 9.2  TSH 0.44  --   --     Liver Function Tests: Recent Labs    02/08/22 1104 12/06/22 1052 01/08/23 1117  AST 13 14 22   ALT 11 10 19   ALKPHOS  --   --  97  BILITOT 0.4 0.2 0.4  PROT 7.3 7.1 7.0  ALBUMIN  --   --  3.6   No results for input(s): "LIPASE", "AMYLASE" in the last 8760 hours. No results for input(s): "AMMONIA" in the last 8760 hours. CBC: Recent Labs    02/08/22 1104 12/06/22 1052 01/08/23 0928 01/10/23 1115  WBC 5.5 5.9 5.7 5.2  NEUTROABS 2,728 2,755 2.3  --   HGB 12.8 13.5 13.9 14.9  HCT 41.1 44.4 44.3 47.5*  MCV 73.4* 73.3* 72.6* 71.8*  PLT 262 263 227 282   Lipid Panel: Recent Labs    01/09/23 0454  CHOL 159  HDL 38*  LDLCALC 104*  TRIG 85  CHOLHDL 4.2   TSH: Recent Labs    12/06/22 1052  TSH 0.44   A1C: Lab Results  Component Value Date   HGBA1C 7.1 (H) 01/09/2023     Assessment/Plan 1. Cerebrovascular accident (CVA), unspecified mechanism (HCC) - hospitalized 09/24-09/28 - CT head no acute intracranial hemorrhage or acute large vessel infarct - TNK was given due to symptoms - started on Eliquis> not taking due to cost - 6 weeks sample given in office today - advised to her to ask about copay card with cardiology - atorvastatin (  LIPITOR) 40 MG tablet; Take 1 tablet (40 mg total) by mouth daily.  Dispense: 90 tablet; Refill: 1 - apixaban (ELIQUIS) 5 MG TABS tablet; Take 1 tablet (5 mg total) by mouth 2 (two) times daily.  Dispense: 60 tablet; Refill: 5  2. Hyperlipidemia associated with type 2 diabetes mellitus (HCC) - LDL 104 (09/25)> goal < 70 - she has not started statin since hospitalization - discussed low fat diet> advised to avoid fried and processed foods - atorvastatin (LIPITOR) 40 MG tablet; Take 1 tablet (40 mg total) by mouth daily.  Dispense: 90 tablet; Refill: 1 - recheck lipid panel in 3 months  3. Medication management - see above - verbal and written instructions given   Total time: 32 minutes. Greater than 50% of total time spent  doing patient education regarding recent stroke and medication management.     Next appt: 06/13/2023  Hazle Nordmann, Juel Burrow  [facility] [phone]

## 2023-01-25 ENCOUNTER — Other Ambulatory Visit: Payer: Medicaid Other

## 2023-01-25 ENCOUNTER — Telehealth: Payer: Self-pay

## 2023-01-29 ENCOUNTER — Other Ambulatory Visit: Payer: Self-pay | Admitting: Orthopedic Surgery

## 2023-01-29 ENCOUNTER — Other Ambulatory Visit: Payer: BLUE CROSS/BLUE SHIELD

## 2023-01-29 ENCOUNTER — Telehealth: Payer: Self-pay

## 2023-01-29 DIAGNOSIS — I10 Essential (primary) hypertension: Secondary | ICD-10-CM

## 2023-01-29 DIAGNOSIS — R2990 Unspecified symptoms and signs involving the nervous system: Secondary | ICD-10-CM

## 2023-01-29 NOTE — Patient Outreach (Signed)
Care Management  Transitions of Care Program Managed Medicaid Transitions of Care week 2   01/29/2023 Name: Courtney Davies MRN: [id number] DOB: November 20, 1975  Subjective: Courtney Davies is a 47 y.o. year old female who is a primary care patient of [name], [name], NP. The Care Management team Engaged with patient Engaged with patient by telephone to assess and address transitions of care needs.   Consent to Services:  Patient was given information about Managed Medicaid Care Management services, agreed to services, and gave verbal consent to participate.   Assessment:           SDOH Interventions    Flowsheet Row Telephone from 01/25/2023 in [place] POPULATION HEALTH DEPARTMENT Telephone from 01/14/2023 in [facility] Community Care Coordination ED to Hosp-Admission (Discharged) from 01/08/2023 in [place] [facility] Office Visit from 03/01/2022 in [facility]  SDOH Interventions      Food Insecurity Interventions Other (Comment)  [name] refer patient to [name] for follow up] -- [id number] Referral, Inpatient TOC --  Transportation Interventions Other (Comment)  [referral to [employer] placed for SDOH needs] Intervention Not Indicated  [pt states her mom will take her to appts or she has Medicaid transportation-she was given info in hospital and has number to call] Inpatient TOC --  Depression Interventions/Treatment  -- -- -- Medication, Currently on Treatment        Goals Addressed             This Visit's Progress    TOC Care Plan       Current Barriers:  Chronic Disease Management support and education needs related to DMII and CVA  Transportation barriers  RNCM Clinical Goal(s):  Patient will work with the Care Management team over the next 30 days to address Transition of Care Barriers: Transportation take all medications exactly as prescribed and will call provider for medication related  questions as evidenced by picking up new meds from pharmacy and begin taking them attend all scheduled medical appointments:   as evidenced by making PCP, cardiology and neuro appt  through collaboration with RN Care manager, provider, and care team.   Interventions: Evaluation of current treatment plan related to  self management and patient's adherence to plan as established by provider  Transitions of Care:  ON TRACK Doctor Visits  - discussed the importance of doctor visits Arranged PCP follow-up within 12-14 days (Care Guide Scheduled) Post discharge activity limitations prescribed by provider reviewed Reviewed Signs and symptoms of infection Discussed sx mgmt po stroke measures with pt  Reviewed with pt transportation resources([employer]) info on d/c paperwork Patient Goals/Self-Care Activities: Participate in Transition of Care Program/Attend TOC scheduled calls Take all medications as prescribed Attend all scheduled provider appointments Call provider office for new concerns or questions  Monitor blood sugars in the home  Follow Up Plan:  Telephone follow up appointment with care management team member scheduled for:  next week The patient has been provided with contact information for the care management team and has been advised to call with any health related questions or concerns.          Plan: The patient has been provided with contact information for the care management team and has been advised to call with any health related questions or concerns.   Alyse Low, RN, BA, [facility], CRRN [facility] Coordinator, Transition of Care Ph # [phone]

## 2023-01-29 NOTE — Patient Instructions (Signed)
Visit Information  Thank you for taking time to visit with me today. Please don't hesitate to contact me if I can be of assistance to you before our next scheduled telephone appointment.  Our next appointment is by telephone on Wednesday, 10/23 at 2pm  Following is a copy of your care plan:   Goals Addressed             This Visit's Progress    TOC Care Plan       Current Barriers:  Chronic Disease Management support and education needs related to DMII and CVA  Transportation barriers  RNCM Clinical Goal(s):  Patient will work with the Care Management team over the next 30 days to address Transition of Care Barriers: Transportation take all medications exactly as prescribed and will call provider for medication related questions as evidenced by picking up new meds from pharmacy and begin taking them attend all scheduled medical appointments:   as evidenced by making PCP, cardiology and neuro appt  through collaboration with RN Care manager, provider, and care team.   Interventions: Evaluation of current treatment plan related to  self management and patient's adherence to plan as established by provider  Transitions of Care:  ON TRACK Doctor Visits  - discussed the importance of doctor visits Arranged PCP follow-up within 12-14 days (Care Guide Scheduled) Post discharge activity limitations prescribed by provider reviewed Reviewed Signs and symptoms of infection Discussed sx mgmt po stroke measures with pt  Reviewed with pt transportation resources([employer]) info on d/c paperwork Patient Goals/Self-Care Activities: Participate in Transition of Care Program/Attend TOC scheduled calls Take all medications as prescribed Attend all scheduled provider appointments Call provider office for new concerns or questions  Monitor blood sugars in the home  Follow Up Plan:  Telephone follow up appointment with care management team member scheduled for:  next week The patient has been  provided with contact information for the care management team and has been advised to call with any health related questions or concerns.          Patient verbalizes understanding of instructions and care plan provided today and agrees to view in MyChart. Active MyChart status and patient understanding of how to access instructions and care plan via MyChart confirmed with patient.     The patient has been provided with contact information for the care management team and has been advised to call with any health related questions or concerns.   Please call the care guide team at [phone] if you need to cancel or reschedule your appointment.   Please call 1-800-273-TALK (toll free, 24 hour hotline) if you are experiencing a Mental Health or Behavioral Health Crisis or need someone to talk to.  Alyse Low, RN, BA, [facility], CRRN [facility] Coordinator, Transition of Care Ph # [phone]

## 2023-01-30 ENCOUNTER — Telehealth: Payer: Self-pay

## 2023-01-30 NOTE — Telephone Encounter (Signed)
PA submitted for Quetiapine

## 2023-02-06 ENCOUNTER — Other Ambulatory Visit: Payer: Self-pay

## 2023-02-06 ENCOUNTER — Telehealth: Payer: Self-pay

## 2023-02-06 NOTE — Patient Outreach (Signed)
Care Management  Transitions of Care Program Managed Medicaid Transitions of Care week 4   02/06/2023 Name: Courtney Davies MRN: [id number] DOB: July 29, 1975  Subjective: Courtney Davies is a 47 y.o. year old female who is a primary care patient of [name], [name], NP. The Care Management team Engaged with patient Engaged with patient by telephone to assess and address transitions of care needs.   Consent to Services:  Patient was given information about Managed Medicaid Care Management services, agreed to services, and gave verbal consent to participate.   Assessment:   RNCM - successfully reached patient but need to schedule follow up appt for tomorrow after 3pm as patient was getting on a bus and could not talk privately, but did tell me she is still having problems with her insurance coverage and still has 3-4 meds that pharmacy states insurance not covering - will look into issue for patient and report back to her tomorrow with findings.  SDOH Interventions    Flowsheet Row Telephone from 01/25/2023 in [place] POPULATION HEALTH DEPARTMENT Telephone from 01/14/2023 in [facility] Community Care Coordination ED to Hosp-Admission (Discharged) from 01/08/2023 in [place] [facility] Office Visit from 03/01/2022 in [facility]  SDOH Interventions      Food Insecurity Interventions Other (Comment)  [name] refer patient to [name] for follow up] -- [id number] Referral, Inpatient TOC --  Transportation Interventions Other (Comment)  [referral to [employer] placed for SDOH needs] Intervention Not Indicated  [pt states her mom will take her to appts or she has Medicaid transportation-she was given info in hospital and has number to call] Inpatient TOC --  Depression Interventions/Treatment  -- -- -- Medication, Currently on Treatment        Goals Addressed   None     Plan: The patient has been provided with contact  information for the care management team and has been advised to call with any health related questions or concerns.   Alyse Low, RN, BA, [facility], CRRN [facility] Coordinator, Transition of Care Ph # [phone]

## 2023-02-06 NOTE — Telephone Encounter (Signed)
Rescheduled follow up to 2:45pm on 02/07/23 as pt was boarding a bus with no privacy

## 2023-02-07 ENCOUNTER — Other Ambulatory Visit: Payer: Self-pay

## 2023-02-07 ENCOUNTER — Telehealth: Payer: Self-pay

## 2023-02-07 DIAGNOSIS — I639 Cerebral infarction, unspecified: Secondary | ICD-10-CM

## 2023-02-07 DIAGNOSIS — I10 Essential (primary) hypertension: Secondary | ICD-10-CM

## 2023-02-07 DIAGNOSIS — R2990 Unspecified symptoms and signs involving the nervous system: Secondary | ICD-10-CM

## 2023-02-07 NOTE — Patient Instructions (Signed)
Visit Information  Hi Lady L,  Thank you for taking time to visit with me today. Please don't hesitate to contact me if I can be of assistance to you before our next scheduled telephone appointment.  I have made a referral for you to speak with one of our Clinical Pharmacist Practitioners (CPPs) and someone will reach out to you for scheduling this valuable telephone appointment with a Pharmacist who may be able to help you find ways to obtain your meds for less money.  Our next appointment is by telephone on 02/13/23 at 2pm  Warm Regards,  Elnita Maxwell  Following is a copy of your care plan:   Goals Addressed             This Visit's Progress    TOC Care Plan       Current Barriers:  Chronic Disease Management support and education needs related to DMII and CVA  Transportation barriers  RNCM Clinical Goal(s):  Patient will work with the Care Management team over the next 30 days to address Transition of Care Barriers: Transportation take all medications exactly as prescribed and will call provider for medication related questions as evidenced by picking up new meds from pharmacy and begin taking them attend all scheduled medical appointments:   as evidenced by making PCP, cardiology and neuro appt  through collaboration with RN Care manager, provider, and care team.   Interventions: Evaluation of current treatment plan related to  self management and patient's adherence to plan as established by provider 10/24 RNCM referred patient to VBCI/Transition of Care [facility] after patient agreed to speak with [facility] regarding barriers to her current poor medication compliance including but not limited to inability to pay for all her meds   Transitions of Care:  ON TRACK Doctor Visits  - discussed the importance of doctor visits Arranged PCP follow-up within 12-14 days (Care Guide Scheduled) Post discharge activity limitations prescribed by provider reviewed Reviewed Signs and symptoms of  infection Discussed sx mgmt po stroke measures with pt  Reviewed with pt transportation resources([employer]) info on d/c paperwork Patient Goals/Self-Care Activities: Participate in Transition of Care Program/Attend [facility] scheduled calls Take all medications as prescribed Attend all scheduled provider appointments Call provider office for new concerns or questions  Monitor blood sugars in the home 10/24 Tele-appt with RNCM - Agreed to speak with [facility] regarding barriers to medication compliance including but not limited to inability to pay for all her meds  Follow Up Plan:  Telephone follow up appointment with care management team member scheduled for:  next week The patient has been provided with contact information for the care management team and has been advised to call with any health related questions or concerns.   Referral made to [facility]. Next RNCM appointment is 02/13/23 @2pm         Patient verbalizes understanding of instructions and care plan provided today and agrees to view in MyChart. Active MyChart status and patient understanding of how to access instructions and care plan via MyChart confirmed with patient.     The patient has been provided with contact information for the care management team and has been advised to call with any health related questions or concerns.   Please call the care guide team at [phone] if you need to cancel or reschedule your appointment.   Please call 1-800-273-TALK (toll free, 24 hour hotline) if you are experiencing a Mental Health or Behavioral Health Crisis or need someone to talk to.  Alyse Low,  RN, BA, CHPN, CRRN [facility] Population Health Care Management Coordinator, Transition of Care Ph # [phone]

## 2023-02-07 NOTE — Patient Outreach (Signed)
Care Management  Transitions of Care Program Managed Medicaid Transitions of Care week 3   02/07/2023 Name: Courtney Davies MRN: [id number] DOB: 03/29/1976  Subjective: Courtney Davies is a 47 y.o. year old female who is a primary care patient of [name], [name], NP. The Care Management team Engaged with patient Engaged with patient by telephone to assess and address transitions of care needs.   Consent to Services:  Patient was given information about Managed Medicaid Care Management services, agreed to services, and gave verbal consent to participate.   Assessment:  See attached documentation in Wrap Up note  SDOH Interventions    Flowsheet Row Telephone from 01/25/2023 in [place] POPULATION HEALTH DEPARTMENT Telephone from 01/14/2023 in [facility] Community Care Coordination ED to Hosp-Admission (Discharged) from 01/08/2023 in [place] [place] [facility] Care Office Visit from 03/01/2022 in [facility]  SDOH Interventions      Food Insecurity Interventions Other (Comment)  [name] refer patient to [name] for follow up] -- [id number] Referral, Inpatient TOC --  Transportation Interventions Other (Comment)  [referral to [employer] placed for SDOH needs] Intervention Not Indicated  [pt states her mom will take her to appts or she has Medicaid transportation-she was given info in hospital and has number to call] Inpatient TOC --  Depression Interventions/Treatment  -- -- -- Medication, Currently on Treatment        Goals Addressed             This Visit's Progress    TOC Care Plan       Current Barriers:  Chronic Disease Management support and education needs related to DMII and CVA  Transportation barriers  RNCM Clinical Goal(s):  Patient will work with the Care Management team over the next 30 days to address Transition of Care Barriers: Transportation take all medications exactly as prescribed and will call provider  for medication related questions as evidenced by picking up new meds from pharmacy and begin taking them attend all scheduled medical appointments:   as evidenced by making PCP, cardiology and neuro appt  through collaboration with RN Care manager, provider, and care team.   Interventions: Evaluation of current treatment plan related to  self management and patient's adherence to plan as established by provider 10/24 RNCM referred patient to VBCI/Transition of Care [facility] after patient agreed to speak with [facility] regarding barriers to her current poor medication compliance including but not limited to inability to pay for all her meds   Transitions of Care:  ON TRACK Doctor Visits  - discussed the importance of doctor visits Arranged PCP follow-up within 12-14 days (Care Guide Scheduled) Post discharge activity limitations prescribed by provider reviewed Reviewed Signs and symptoms of infection Discussed sx mgmt po stroke measures with pt  Reviewed with pt transportation resources([employer]) info on d/c paperwork Patient Goals/Self-Care Activities: Participate in Transition of Care Program/Attend [facility] scheduled calls Take all medications as prescribed Attend all scheduled provider appointments Call provider office for new concerns or questions  Monitor blood sugars in the home 10/24 Tele-appt with RNCM - Agreed to speak with [facility] regarding barriers to medication compliance including but not limited to inability to pay for all her meds  Follow Up Plan:  Telephone follow up appointment with care management team member scheduled for:  next week The patient has been provided with contact information for the care management team and has been advised to call with any health related questions or  concerns.   Referral made to [facility]. Next RNCM appointment is 02/13/23 @2pm         Plan: The patient has been provided with contact information for the care management team and has been advised to call with any  health related questions or concerns.   Alyse Low, RN, BA, [facility], CRRN [facility] Coordinator, Transition of Care Ph # [phone]

## 2023-02-07 NOTE — Plan of Care (Signed)
[facility] Tonsillectomy/Adenoidectomy, Postoperative PEDS care plan entered in error.

## 2023-02-07 NOTE — Therapy (Deleted)
OUTPATIENT PHYSICAL THERAPY NEURO EVALUATION   Patient Name: Courtney Davies MRN: [id number] DOB:May 24, 1975, 47 y.o., female Today's Date: 02/07/2023   END OF SESSION:   Past Medical History:  Diagnosis Date   Anemia 09/2015   Anoxic brain injury (HCC)    Arthritis    "knees" (04/23/2017)   Asthma    "teens; went away; came back" (04/23/2017)   B12 deficiency anemia 04/23/2017   Cardiac arrest (HCC)    Chronic bronchitis (HCC)    Chronic low back pain with sciatica    Chronic lower back pain    Diabetes mellitus without complication (HCC)    Diabetes type 2, controlled (HCC)    Elevated ferritin level    Fatty liver    GERD (gastroesophageal reflux disease)    GERD with stricture    Headache    "1-2/wk" (04/23/2017)   History of blood transfusion "plenty"   "related to anemia" (04/23/2017)   Hypertension    Inguinal hernia    Low back pain    Migraine    "1-2/month" (04/23/2017)   OA (osteoarthritis) of knee--left    Paroxysmal atrial fibrillation (HCC)    Sub-clinical -seen on device?, elevated CHADSVASC   Symptomatic anemia 04/23/2017   Vitamin B 12 deficiency    Past Surgical History:  Procedure Laterality Date   ABDOMINAL HYSTERECTOMY  YRS AGO   COMPLETE   CESAREAN SECTION  1998; 2002   ESOPHAGEAL DILATION  02/2020   by Dr Lanae Boast   INGUINAL HERNIA REPAIR Bilateral 1980s    "total of 4 surgeries" (04/23/2017)   IR GASTROSTOMY TUBE MOD SED  12/13/2020   KNEE ARTHROSCOPY WITH MEDIAL MENISECTOMY Left 01/30/2018   Procedure: LEFT KNEE ARTHROSCOPY WITH PARTIAL LATERAL MENISCECTOMY;  Surgeon: Kathryne Hitch, MD;  Location: [facility];  Service: Orthopedics;  Laterality: Left;   KNEE CARTILAGE SURGERY Left    SUBQ ICD IMPLANT N/A 09/13/2021   Procedure: SUBQ ICD IMPLANT;  Surgeon: [name], MD;  Location: [facility] INVASIVE CV LAB;  Service: Cardiovascular;  Laterality: N/A;   TUBAL LIGATION  2002   Patient Active Problem List   Diagnosis Date Noted   Stroke  (HCC) 01/08/2023   Mild intermittent asthma without complication 02/09/2022   Vitamin B12 deficiency 02/09/2022   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   Cough 04/26/2021   Acute blood loss anemia    Controlled type 2 diabetes mellitus with hyperglycemia (HCC)    Essential hypertension    Anoxic brain injury (HCC) 01/02/2021   Fever 12/20/2020   Abdominal wall cellulitis 12/20/2020   Dysphagia    Goals of care, counseling/discussion    Acute systolic heart failure (HCC)    Encephalopathy acute    Type 1 diabetes mellitus without complication (HCC) 12/02/2020   Hypertension 12/02/2020   GERD (gastroesophageal reflux disease) 12/02/2020   Anemia 12/02/2020   Cardiac arrest (HCC) 12/01/2020   Unilateral primary osteoarthritis, left knee 12/01/2018   Lateral meniscus, posterior horn derangement, left 05/14/2018   Status post arthroscopy of left knee 05/14/2018   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/19/2017   Anemia 04/23/2017   Acute bronchitis 04/23/2017   Chronic back pain 10/03/2015   Symptomatic anemia 04/10/2014   Bilateral edema of lower extremity    Low back pain with left-sided sciatica     PCP: [name], NP   REFERRING PROVIDER: Elmer Picker, NP   REFERRING DIAG:  G45.9 (ICD-10-CM) - TIA (transient ischemic attack) G93.1 (  ICD-10-CM) - Anoxic brain injury (HCC)    THERAPY DIAG:  No diagnosis found.  RATIONALE FOR EVALUATION AND TREATMENT: Rehabilitation  ONSET DATE: ***01/08/23 - TIA; 2022 - Anoxic brain injury  NEXT MD VISIT: ***   SUBJECTIVE:                                                                                                                                                                                                         SUBJECTIVE STATEMENT: ***  Pt accompanied by: {accompnied:27141}  PAIN: Are you having pain? {OPRCPAIN:27236}  PERTINENT HISTORY:  ***Anoxic brain  injury after V-fib cardiac arrest in 2022 with ICD placement; HTN; DM-II; OA - knees; LBP; GERD; paroxysmal A-fib; migraines; asthma; anxiety  PRECAUTIONS: {Therapy precautions:24002}  RED FLAGS: {PT Red Flags:29287}  WEIGHT BEARING RESTRICTIONS: {Yes ***/No:24003}  FALLS:  Has patient fallen in last 6 months? {fallsyesno:27318}  LIVING ENVIRONMENT: Lives with: lives with their family Lives in: House/apartment Stairs: Yes: External: 3-4 steps; on right going up and on left going up Has following equipment at home: {Assistive devices:23999}  OCCUPATION: ***Works at [employer]  PLOF: {PLOF:24004}  PATIENT GOALS: ***   OBJECTIVE: (objective measures completed at initial evaluation unless otherwise dated)  DIAGNOSTIC FINDINGS:  01/10/23 - MR Brain w/o contrast: IMPRESSION: No evidence of acute intracranial abnormality.  01/09/23 - CT head w/o contrast: IMPRESSION: 1. No evidence of an acute intracranial abnormality. 2. Minor paranasal sinus disease.  01/08/23 - CT Angio head & neck: IMPRESSION: Negative CTA. No occlusion, stenosis, or irregularity of major arteries in the head and neck.  01/08/23 - CT head (Code Stroke): IMPRESSION: 1. No acute intracranial hemorrhage or evidence of acute large vessel territory infarct. ASPECT score is 10. 2. Possible hyperdense right MCA, though this may be artifactual due to asymmetric streak artifact along the middle cranial fossae. CTA head and neck is recommended for further evaluation.  COGNITION: Overall cognitive status: {cognition:24006}   SENSATION: {sensation:27233}  COORDINATION: ***  EDEMA:  {edema:24020}  MUSCLE TONE: {LE tone:25568}  MUSCLE LENGTH: Hamstrings: Right *** deg; Left *** deg Maisie Fus test: Right *** deg; Left *** deg Hamstrings: *** ITB: *** Piriformis: *** Hip flexors: *** Quads: *** Heelcord: ***  DTRs:  {DTR SITE:24025}  POSTURE:  {posture:25561}  LOWER EXTREMITY ROM:     {AROM/PROM:27142}   Right eval Left eval  Hip flexion    Hip extension    Hip abduction    Hip adduction    Hip internal rotation    Hip external rotation    Knee flexion  Knee extension    Ankle dorsiflexion    Ankle plantarflexion    Ankle inversion    Ankle eversion     (Blank rows = not tested)  LOWER EXTREMITY MMT:    MMT Right eval Left eval  Hip flexion    Hip extension    Hip abduction    Hip adduction    Hip internal rotation    Hip external rotation    Knee flexion    Knee extension    Ankle dorsiflexion    Ankle plantarflexion    Ankle inversion    Ankle eversion    (Blank rows = not tested)  BED MOBILITY:  {Bed mobility:24027}  TRANSFERS: Assistive device utilized: {Assistive devices:23999}  Sit to stand: {Levels of assistance:24026} Stand to sit: {Levels of assistance:24026} Chair to chair: {Levels of assistance:24026} Floor: {Levels of assistance:24026}  GAIT: Distance walked: *** Assistive device utilized: {Assistive devices:23999} Level of assistance: {Levels of assistance:24026} Gait pattern: {gait characteristics:25376} Comments: ***  RAMP: Level of Assistance: {Levels of assistance:24026} Assistive device utilized: {Assistive devices:23999} Ramp Comments: ***  CURB:  Level of Assistance: {Levels of assistance:24026} Assistive device utilized: {Assistive devices:23999} Curb Comments: ***  STAIRS:  Level of Assistance: {Levels of assistance:24026}  Stair Negotiation Technique: {Stair Technique:27161} with {Rail Assistance:27162}  Number of Stairs: ***   Height of Stairs: ***  Comments: ***  FUNCTIONAL TESTS:  {Functional tests:24029}  PATIENT SURVEYS:  {rehab surveys:24030}   TODAY'S TREATMENT:  ***   PATIENT EDUCATION:  Education details: {Education details:27468}  Person educated: {Person educated:25204} Education method: {Education Method:25205} Education comprehension: {Education Comprehension:25206}  HOME EXERCISE  PROGRAM: ***   ASSESSMENT:  CLINICAL IMPRESSION: JALEEYA SATURDAY is a 47 y.o. female who was referred to physical therapy for evaluation and treatment for ***.    OBJECTIVE IMPAIRMENTS: {opptimpairments:25111}.   ACTIVITY LIMITATIONS: {activitylimitations:27494}  PARTICIPATION LIMITATIONS: {participationrestrictions:25113}  PERSONAL FACTORS: {Personal factors:25162} are also affecting patient's functional outcome.   REHAB POTENTIAL: {rehabpotential:25112}  CLINICAL DECISION MAKING: {clinical decision making:25114}  EVALUATION COMPLEXITY: {Evaluation complexity:25115}   GOALS: Goals reviewed with patient? {yes/no:20286}  SHORT TERM GOALS: Target date: ***  Patient will be independent with initial HEP to improve outcomes and carryover.  Baseline: *** Goal status: {GOALSTATUS:25110}  2.  Patient will be educated on strategies to decrease risk of falls.  Baseline: *** Goal status: {GOALSTATUS:25110}  3.  Patient will demonstrate decreased TUG time to </= *** sec to decrease risk for falls with transitional mobility. Baseline: *** Goal status: {GOALSTATUS:25110}  LONG TERM GOALS: Target date: ***  Patient will be independent with advanced/ongoing HEP to facilitate ability to maintain/progress functional gains from skilled physical therapy services. Baseline: *** Goal status: {GOALSTATUS:25110}  2.  Patient will be able to ambulate 600' with or w/o LRAD on variable surfaces with good safety to access community.  Baseline: *** Goal status: {GOALSTATUS:25110}  3.  Patient will be able to step up/down curb safely with LRAD for safety with community ambulation.  Baseline: *** Goal status: {GOALSTATUS:25110}   4.  Patient will demonstrate improved functional LE strength as demonstrated by ***. Baseline: *** Goal status: {GOALSTATUS:25110}  5.  Patient will improve 5x STS time to </= *** seconds for improved efficiency and safety with transfers Baseline: *** Goal  status: {GOALSTATUS:25110}   6.  Patient will demonstrate gait speed of >/= 1.8 ft/sec (0.55 m/s) to be a safe limited community ambulator with decreased risk for recurrent falls.  Baseline: *** Goal status: {GOALSTATUS:25110}  7.  Patient will  improve Berg score to >/= ***/56 to improve safety and stability with ADLs in standing and reduce risk for falls. (MCID= 8 points)  Baseline: *** Goal status: {GOALSTATUS:25110}  8.  Patient will demonstrate at least 19/24 on DGI to improve gait stability and reduce risk for falls. Baseline: *** Goal status: {GOALSTATUS:25110}  9. Patient will improve FGA score to at least 19/30 to improve gait stability and reduce risk for falls. Baseline: *** Goal status: {GOALSTATUS:25110}  10.  Patient will report *** on *** (patient reported outcome measure) to demonstrate improved functional ability. Baseline: *** Goal status: {GOALSTATUS:25110}   PLAN:  PT FREQUENCY: {rehab frequency:25116}  PT DURATION: {rehab duration:25117}  PLANNED INTERVENTIONS: {rehab planned interventions:25118::"97110-Therapeutic exercises","97530- Therapeutic [phone]- Neuromuscular re-education","97535- Self NFAO","13086- Manual therapy"}  PLAN FOR NEXT SESSION: ***   Marry Guan, PT 02/07/2023, 2:23 PM

## 2023-02-07 NOTE — Patient Outreach (Signed)
Medicaid Managed Care Social Work Note  02/07/2023 Name:  Courtney Davies MRN:  [id number] DOB:  06/03/1975  Courtney Davies is an 47 y.o. year old female who is a primary patient of [name], [name], NP.  The Medicaid Managed Care Coordination team was consulted for assistance with:  Community Resources   Ms. Hopgood was given information about Medicaid Managed Care Coordination team services today. Courtney Davies Patient agreed to services and verbal consent obtained.  Engaged with patient  for by telephone forinitial visit in response to referral for case management and/or care coordination services.   Assessments/Interventions:  Review of past medical history, allergies, medications, health status, including review of consultants reports, laboratory and other test data, was performed as part of comprehensive evaluation and provision of chronic care management services.  SDOH: (Social Determinant of Health) assessments and interventions performed: SDOH Interventions    Flowsheet Row Telephone from 01/25/2023 in [place] POPULATION HEALTH DEPARTMENT Telephone from 01/14/2023 in [facility] Community Care Coordination ED to Hosp-Admission (Discharged) from 01/08/2023 in [place] Progressive Care Office Visit from 03/01/2022 in [facility] & Adult Medicine  SDOH Interventions      Food Insecurity Interventions Other (Comment)  [name] refer patient to [name] for follow up] -- [id number] Referral, Inpatient TOC --  Transportation Interventions Other (Comment)  [referral to [employer] placed for [name] needs] Intervention Not Indicated  [pt states her mom will take her to appts or she has Medicaid transportation-she was given info in hospital and has number to call] Inpatient TOC --  Depression Interventions/Treatment  -- -- -- Medication, Currently on Treatment     BSW completed a telephone outreach with patient, she states she is having a hard time  getting her medications because its keeps showing that she has another insurance. Patient states she has spoke with the pharmacy and DSS, each time they fix it it goes right back. BSW provided patient with the ID number for the [facility] plan. Patient  states she does need utility resources, BSW will mail resources.  Advanced Directives Status:  Not addressed in this encounter.  Care Plan                 Allergies  Allergen Reactions   Latex Hives   Other Other (See Comments), Anaphylaxis, Swelling and Hives    Hair glue causes throat to close and hives "glue" Hair glue causes throat to close    Codeine Nausea And Vomiting    Was on an empty stomach.   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   Cough 04/26/2021   Acute blood loss anemia    Controlled type 2 diabetes mellitus with hyperglycemia (HCC)    Essential hypertension    Anoxic brain injury (HCC) 01/02/2021   Fever 12/20/2020   Abdominal wall cellulitis 12/20/2020   Dysphagia    Goals of care, counseling/discussion    Acute systolic heart failure (HCC)    Encephalopathy acute    Type 1 diabetes mellitus without complication (HCC) 12/02/2020   Hypertension 12/02/2020   GERD (gastroesophageal reflux disease) 12/02/2020   Anemia 12/02/2020   Cardiac arrest (  HCC) 12/01/2020   Unilateral primary osteoarthritis, left knee 12/01/2018   Lateral meniscus, posterior horn derangement, left 05/14/2018   Status post arthroscopy of left knee 05/14/2018   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/19/2017   Anemia 04/23/2017   Acute bronchitis 04/23/2017   Chronic back pain 10/03/2015   Symptomatic  anemia 04/10/2014   Bilateral edema of lower extremity    Low back pain with left-sided sciatica     Conditions to be addressed/monitored per PCP order:   utility resources  There are no care plans that you recently modified to display for this patient.   Follow up:  Patient agrees to Care Plan and Follow-up.  Plan: The Managed Medicaid care management team will reach out to the patient again over the next 30 days.  Date/time of next scheduled Social Work care management/care coordination outreach:  03/12/23  [name], Kenard Gower, [facility] Health  Managed [facility] Social Worker [phone]

## 2023-02-07 NOTE — Patient Instructions (Signed)
Visit Information  Courtney Davies was given information about Medicaid Managed Care team care coordination services as a part of their [facility] Community Plan Medicaid benefit. Courtney Davies verbally consented to engagement with the [facility] Managed Care team.   If you are experiencing a medical emergency, please call 911 or report to your local emergency department or urgent care.   If you have a non-emergency medical problem during routine business hours, please contact your provider's office and ask to speak with a nurse.   For questions related to your [facility], please call: [phone] or visit the homepage here: [email or web address]  If you would like to schedule transportation through your [facility], please call the following number at least 2 days in advance of your appointment: [phone]   Rides for urgent appointments can also be made after hours by calling Member Services.  Call the Behavioral Health Crisis Line at [phone], at any time, 24 hours a day, 7 days a week. If you are in danger or need immediate medical attention call 911.  If you would like help to quit smoking, call 1-800-QUIT-NOW (330-854-4540) OR Espaol: 1-855-Djelo-Ya (1-324-401-0272) o para ms informacin haga clic aqu or Text READY to 536-644 to register via text  Ms. Waage - following are the goals we discussed in your visit today:   Goals Addressed   None      Social Worker will follow up on 03/12/23.   Courtney Davies, Courtney Davies, MHA [facility] Health  Managed Medicaid Social Worker [phone]   Following is a copy of your plan of care:  There are no care plans that you recently modified to display for this patient.

## 2023-02-08 ENCOUNTER — Telehealth: Payer: Self-pay

## 2023-02-08 ENCOUNTER — Other Ambulatory Visit: Payer: Self-pay | Admitting: Orthopedic Surgery

## 2023-02-08 DIAGNOSIS — F5105 Insomnia due to other mental disorder: Secondary | ICD-10-CM

## 2023-02-08 NOTE — Progress Notes (Signed)
Care Guide Note  02/08/2023 Name: Courtney Davies MRN: [id number] DOB: 1975/11/08  Referred by: [name], NP Reason for referral : Care Coordination (Outreach to schedule with Pharm d )   Courtney Davies is a 47 y.o. year old female who is a primary care patient of [name], [name], NP. [name] was referred to the pharmacist for assistance related to HTN.    Successful contact was made with the patient to discuss pharmacy services including being ready for the pharmacist to call at least 5 minutes before the scheduled appointment time, to have medication bottles and any blood sugar or blood pressure readings ready for review. The patient agreed to meet with the pharmacist via with the pharmacist via telephone visit on (date/time).  02/14/2023  Penne Lash, RMA Care Guide [facility]  [place], [place] [address] Direct Dial: [phone] [email or web address]

## 2023-02-09 ENCOUNTER — Other Ambulatory Visit: Payer: Self-pay | Admitting: Physical Medicine & Rehabilitation

## 2023-02-09 DIAGNOSIS — F321 Major depressive disorder, single episode, moderate: Secondary | ICD-10-CM

## 2023-02-11 ENCOUNTER — Ambulatory Visit: Payer: Medicaid Other | Admitting: Physical Therapy

## 2023-02-11 ENCOUNTER — Other Ambulatory Visit: Payer: BLUE CROSS/BLUE SHIELD | Admitting: Pharmacist

## 2023-02-13 ENCOUNTER — Telehealth: Payer: Self-pay

## 2023-02-13 ENCOUNTER — Other Ambulatory Visit: Payer: Self-pay

## 2023-02-13 DIAGNOSIS — R2990 Unspecified symptoms and signs involving the nervous system: Secondary | ICD-10-CM

## 2023-02-13 NOTE — Patient Outreach (Signed)
Care Management  Transitions of Care Program Managed Medicaid Transitions of Care week 4   02/13/2023 Name: Courtney Davies MRN: [id number] DOB: August 13, 1975  Subjective: Courtney Davies is a 47 y.o. year old female who is a primary care patient of [name], [name], NP. The Care Management team Engaged with patient Engaged with patient by telephone to assess and address transitions of care needs.   Consent to Services:  Patient was given information about Managed Medicaid Care Management services, agreed to services, and gave verbal consent to participate.   Assessment:           SDOH Interventions    Flowsheet Row Telephone from 01/25/2023 in [place] POPULATION HEALTH DEPARTMENT Telephone from 01/14/2023 in [facility] Community Care Coordination ED to Hosp-Admission (Discharged) from 01/08/2023 in [place] Progressive Care Office Visit from 03/01/2022 in [facility] & Adult Medicine  SDOH Interventions      Food Insecurity Interventions Other (Comment)  [name] refer patient to [name] for follow up] -- [id number] Referral, Inpatient TOC --  Transportation Interventions Other (Comment)  [referral to [employer] placed for SDOH needs] Intervention Not Indicated  [pt states her mom will take her to appts or she has Medicaid transportation-she was given info in hospital and has number to call] Inpatient TOC --  Depression Interventions/Treatment  -- -- -- Medication, Currently on Treatment        Goals Addressed             This Visit's Progress    TOC Care Plan       Current Barriers:  Chronic Disease Management support and education needs related to DMII and CVA  Transportation barriers  RNCM Clinical Goal(s):  Patient will work with the Care Management team over the next 30 days to address Transition of Care Barriers: Transportation take all medications exactly as prescribed and will call provider for medication related  questions as evidenced by picking up new meds from pharmacy and begin taking them attend all scheduled medical appointments:   as evidenced by making PCP, cardiology and neuro appt  through collaboration with RN Care manager, provider, and care team.   Interventions: Evaluation of current treatment plan related to  self management and patient's adherence to plan as established by provider 10/24 [facility] referred patient to VBCI/Transition of Care [facility] after patient agreed to speak with [employer] regarding barriers to her current poor medication compliance including but not limited to inability to pay for all her meds 10/28 was followed up by Lynnda Shields [facility]   Transitions of Care:  ON TRACK Doctor Visits  - discussed the importance of doctor visits Arranged PCP follow-up within 12-14 days (Care Guide Scheduled) Post discharge activity limitations prescribed by provider reviewed Reviewed Signs and symptoms of infection Discussed sx mgmt po stroke measures with pt  Reviewed with pt transportation resources([employer]) info on d/c paperwork Patient Goals/Self-Care Activities: Participate in Transition of Care Program/Attend [facility] scheduled calls Take all medications as prescribed Attend all scheduled provider appointments Call provider office for new concerns or questions  Monitor blood sugars in the home 10/24 Tele-appt with RNCM - Agreed to speak with [facility] regarding barriers to medication compliance including but not limited to inability to pay for all her meds 10/28 Patient engaged in Tele-appt w/ [facility] Lynnda Shields  10/30 Final Transition of Care 30d program Tele-appt with [facility] - agreed to engage with a longitudinal RN Case Manager for ongoing Chronic Disease support  via tele-appt  Follow Up Plan:  Telephone follow up appointment with care management team member scheduled for:  next week The patient has been provided with contact information for the care management team and has been advised to call with  any health related questions or concerns.   Referral made to Longitudinal CCM        Plan: The patient has been provided with contact information for the care management team and has been advised to call with any health related questions or concerns.   Alyse Low, RN, BA, [facility], CRRN [facility] Coordinator, Transition of Care Ph # [phone]

## 2023-02-13 NOTE — Patient Instructions (Signed)
Visit Information  Dear "Courtney Davies",  Thank you for taking time to visit with me today. It has been my pleasure to work with you during your immediate post hospitalization adjustment and transition of care back home. Please don't hesitate to contact me if I can be of assistance to you before you hear from one of our Case Managers who will follow you from here on. I have referred you to a "Longitudinal" RN Case Manager to help support you via telephone visits just as I have over the last 30 days post hospitalization.  Best wishes and warm regards to you,  Elnita Maxwell   Following is a copy of your care plan:   Goals Addressed             This Visit's Progress    TOC Care Plan       Current Barriers:  Chronic Disease Management support and education needs related to DMII and CVA  Transportation barriers  RNCM Clinical Goal(s):  Patient will work with the Care Management team over the next 30 days to address Transition of Care Barriers: Transportation take all medications exactly as prescribed and will call provider for medication related questions as evidenced by picking up new meds from pharmacy and begin taking them attend all scheduled medical appointments:   as evidenced by making PCP, cardiology and neuro appt  through collaboration with RN Care manager, provider, and care team.   Interventions: Evaluation of current treatment plan related to  self management and patient's adherence to plan as established by provider 10/24 RNCM referred patient to VBCI/Transition of Care [facility] after patient agreed to speak with [facility] regarding barriers to her current poor medication compliance including but not limited to inability to pay for all her meds 10/28 was followed up by Lynnda Shields [facility]   Transitions of Care:  ON TRACK Doctor Visits  - discussed the importance of doctor visits Arranged PCP follow-up within 12-14 days (Care Guide Scheduled) Post discharge activity limitations prescribed by  provider reviewed Reviewed Signs and symptoms of infection Discussed sx mgmt po stroke measures with pt  Reviewed with pt transportation resources([employer]) info on d/c paperwork Patient Goals/Self-Care Activities: Participate in Transition of Care Program/Attend [facility] scheduled calls Take all medications as prescribed Attend all scheduled provider appointments Call provider office for new concerns or questions  Monitor blood sugars in the home 10/24 Tele-appt with RNCM - Agreed to speak with [facility] regarding barriers to medication compliance including but not limited to inability to pay for all her meds 10/28 Patient engaged in Tele-appt w/ [facility] Lynnda Shields  10/30 Final Transition of Care 30d program Tele-appt with [facility] - agreed to engage with a longitudinal RN Case Manager for ongoing Chronic Disease support via tele-appt  Follow Up Plan:  Telephone follow up appointment with care management team member scheduled for:  next week The patient has been provided with contact information for the care management team and has been advised to call with any health related questions or concerns.   Referral made to Longitudinal CCM        Patient verbalizes understanding of instructions and care plan provided today and agrees to view in MyChart. Active MyChart status and patient understanding of how to access instructions and care plan via MyChart confirmed with patient.     The patient has been provided with contact information for the care management team and has been advised to call with any health related questions or concerns.   Please call the  care guide team at [phone] if you need to cancel or reschedule your appointment.   Please call 1-800-273-TALK (toll free, 24 hour hotline) if you are experiencing a Mental Health or Behavioral Health Crisis or need someone to talk to.  Alyse Low, RN, BA, [facility], CRRN [facility] Coordinator, Transition of Care Ph #  [phone]

## 2023-02-14 ENCOUNTER — Other Ambulatory Visit: Payer: BLUE CROSS/BLUE SHIELD | Admitting: Pharmacist

## 2023-02-19 ENCOUNTER — Other Ambulatory Visit: Payer: Self-pay | Admitting: *Deleted

## 2023-02-19 NOTE — Patient Outreach (Signed)
Medicaid Managed Care   Nurse Care Manager Note  02/19/2023 Name:  Courtney Davies MRN:  [id number] DOB:  [birth year]  Courtney Davies is an 47 y.o. year old female who is a primary patient of Davies, Courtney E, NP.  The [facility] Managed Care Coordination team was consulted for assistance with:    Memory deficits post anoxic brain injury  [name] was given information about Medicaid Managed Care Coordination team services today. Courtney Davies Patient agreed to services and verbal consent obtained.  Engaged with patient by telephone for initial visit in response to provider referral for case management and/or care coordination services.   Assessments/Interventions:  Review of past medical history, allergies, medications, health status, including review of consultants reports, laboratory and other test data, was performed as part of comprehensive evaluation and provision of chronic care management services.  SDOH (Social Determinants of Health) assessments and interventions performed: SDOH Interventions    Flowsheet Row Telephone from 01/25/2023 in [place] [facility] DEPARTMENT Telephone from 01/14/2023 in [facility] Community Care Coordination ED to Hosp-Admission (Discharged) from 01/08/2023 in [place] [facility] Office Visit from 03/01/2022 in [facility]  SDOH Interventions      Food Insecurity Interventions Other (Comment)  Courtney Davies refer patient to Courtney Davies for follow up] -- [id number] Referral, Inpatient TOC --  Transportation Interventions Other (Comment)  [referral to [employer] placed for Courtney Davies needs] Intervention Not Indicated  [pt states her mom will take her to appts or she has Medicaid transportation-she was given info in hospital and has number to call] Inpatient TOC --  Depression Interventions/Treatment  -- -- -- Medication, Currently on Treatment       Care Plan  Allergies  Allergen Reactions    Latex Hives   Other Other (See Comments), Anaphylaxis, Swelling and Hives    Hair glue causes throat to close and hives "glue" Hair glue causes throat to close    Codeine Nausea And Vomiting    Was on an empty stomach.   Nitrofurantoin Rash    Medications Reviewed Today     Reviewed by Courtney Dach, RN (Registered Nurse) on 02/19/23 at 1542  Med List Status: <None>   Medication Order Taking? Sig Documenting Provider Last Dose Status Informant  ACCU-CHEK GUIDE test strip [id number] No   Patient not taking: Reported on 02/19/2023   [provider] Not Taking Active Self, Mother, Pharmacy Records  acetaminophen (TYLENOL) 500 MG tablet [id number] Yes Take 1 tablet (500 mg total) by mouth every 6 (six) hours as needed. Davies, Courtney A, PA-C Taking Active Self, Mother, Pharmacy Records  albuterol (VENTOLIN HFA) 108 (90 Base) MCG/ACT inhaler [id number] No Inhale 2 puffs into the lungs every 6 (six) hours as needed for wheezing or shortness of breath.  Patient not taking: Reported on 02/19/2023   Courtney Cree, PA-C Not Taking Active Self, Mother, Pharmacy Records  apixaban (ELIQUIS) 5 MG TABS tablet [id number] No Take 1 tablet (5 mg total) by mouth 2 (two) times daily. Courtney Heir, NP Unknown Active            Med Note Mliss Davies, Courtney A   Fri Jan 25, 2023 11:13 AM) Patient was given 6wk sample of Eliquis by PCP and started med on 10/10 - had not taken post hospitalization due to cost  atorvastatin (LIPITOR) 40 MG tablet [id number] No Take 1 tablet (40 mg total) by mouth daily. Courtney Heir, NP  Unknown Active            Med Note Mliss Davies, Courtney A   Fri Jan 25, 2023 11:14 AM) Had not started statin until seen by PCP on 10/10 (due to cost)  BD PEN NEEDLE NANO 2ND GEN 32G X 4 MM MISC [id number] No 3 (three) times daily. as directed  Patient not taking: Reported on 02/19/2023   [provider] Not Taking Active Self, Mother, Pharmacy Records  Blood Glucose Monitoring Suppl  (ACCU-CHEK GUIDE ME) w/Device KIT [id number] No SMARTSIG:Via Meter  Patient not taking: Reported on 02/19/2023   [provider] Not Taking Active Self, Mother, Pharmacy Records  carvedilol (COREG) 6.25 MG tablet [id number] No TAKE 1 TABLET BY MOUTH TWICE DAILY Davies, Courtney E, NP Unknown Active Self, Mother, Pharmacy Records  diclofenac Sodium (VOLTAREN) 1 % GEL [id number]  Apply 4 g topically 4 (four) times daily. Apply to left knee Davies, Courtney C, NP  Active Self, Mother, Pharmacy Records  empagliflozin (JARDIANCE) 10 MG TABS tablet [id number] No Take 1 tablet (10 mg total) by mouth every morning. Courtney Heir, NP Unknown Active Self, Mother, Pharmacy Records  hydrOXYzine (VISTARIL) 25 MG capsule [id number] No TAKE 1 CAPSULE(25 MG) BY MOUTH AT BEDTIME AS NEEDED Davies, Courtney E, NP Unknown Active   Insulin Syringe-Needle U-100 (INSULIN SYRINGE .5CC/31GX5/16") 31G X 5/16" 0.5 ML MISC [id number] No 3 (three) times daily.  Patient not taking: Reported on 02/19/2023   [provider] Not Taking Active Self, Mother, Pharmacy Records  losartan (COZAAR) 25 MG tablet [id number] No TAKE 1 TABLET BY MOUTH DAILY Davies, Courtney E, NP Unknown Active   metformin (FORTAMET) 500 MG (OSM) 24 hr tablet [id number] No Take 1,000 mg by mouth daily with breakfast. [provider] Unknown Active Self, Mother, Pharmacy Records  metroNIDAZOLE (METROGEL) 0.75 % vaginal gel [id number] No Place 1 Applicatorful vaginally daily. For 5 days [provider] Unknown Active Self, Mother, Pharmacy Records  OZEMPIC, 0.25 OR 0.5 MG/DOSE, 2 MG/3ML SOPN [id number] No Inject 0.5 mg into the skin once a week.  Patient not taking: Reported on 02/11/2023   [provider] Not Taking Active Self, Mother, Pharmacy Records  QUEtiapine (SEROQUEL) 25 MG tablet [id number] No TAKE 1 TABLET(25 MG) BY MOUTH AT BEDTIME Courtney Oyster, MD Unknown Active   rivastigmine (EXELON) 3 MG capsule [id number] No TAKE 1  CAPSULE(3 MG) BY MOUTH TWICE DAILY Courtney Oyster, MD Unknown Active Self, Mother, Pharmacy Records  sertraline (ZOLOFT) 50 MG tablet [id number] No Take 1 tablet (50 mg total) by mouth daily. 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   Cough 04/26/2021   Acute blood loss anemia    Controlled type 2 diabetes mellitus with hyperglycemia (HCC)    Essential hypertension    Anoxic brain injury (HCC) 01/02/2021   Fever 12/20/2020   Abdominal wall cellulitis 12/20/2020   Dysphagia    Goals of care, counseling/discussion    Acute systolic heart failure (HCC)    Encephalopathy acute    Type 1 diabetes mellitus without complication (HCC) 12/02/2020   Hypertension 12/02/2020   GERD (gastroesophageal reflux disease) 12/02/2020   Anemia 12/02/2020   Cardiac arrest (  HCC) 12/01/2020   Unilateral primary osteoarthritis, left knee 12/01/2018   Lateral meniscus, posterior horn derangement, left 05/14/2018   Status post arthroscopy of left knee 05/14/2018   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/19/2017   Anemia 04/23/2017   Acute bronchitis 04/23/2017   Chronic back pain 10/03/2015   Symptomatic anemia 04/10/2014   Bilateral edema of lower extremity    Low back pain with left-sided sciatica     Conditions to be addressed/monitored per PCP order:   Memory deficits post anoxic brain inury  Care Plan : RN Care Manager Plan of Care  Updates made by Courtney Dach, RN since 02/19/2023 12:00 AM     Problem: Health Management needs related to memory deficits after Anoxic Brain Injury      Long-Range Goal: Development of Plan of Care to address Health Management needs related to  memory deficits after Anoxic Brain Injury   Start Date: 02/19/2023  Expected End Date: 05/20/2023  Note:   Current Barriers:  Chronic Disease Management support and education needs related to Anoxic Brain Injury Ms. Guess has difficulty remembering her appointments and medications since anoxic brain injury. Her mother helps with medication management.   RNCM Clinical Goal(s):  Patient will take all medications exactly as prescribed and will call provider for medication related questions as evidenced by patient reports attend all scheduled medical appointments: 02/28/23 with Hem/Onc, 03/04/23 with PT evaluation, 03/06/23 with Pain Management and 03/11/23 with Endocrinology as evidenced by provider documentation continue to work with RN Care Manager to address care management and care coordination needs related to  memory deficits related to Anoxic Brain injury as evidenced by adherence to CM Team Scheduled appointments through collaboration with RN Care manager, provider, and care team.   Interventions: Evaluation of current treatment plan related to  self management and patient's adherence to plan as established by provider   Memory Deficits post Anoxic Brain Injury  (Status:  New goal.)  Long Term Goal Evaluation of current treatment plan related to  memory deficits post Anoxic Brain Injury ,  self-management and patient's adherence to plan as established by provider. Discussed plans with patient for ongoing care management follow up and provided patient with direct contact information for care management team Advised patient to take all medications to upcoming appointments Provided education to patient re: heart health Reviewed scheduled/upcoming provider appointments including 02/28/23 with [facility], 03/04/23 for PT evaluation, 03/06/23 with Pain Management and 03/11/23 with [facility] Endocrin[facility] RNCM will mail AVS which will include a copy of upcoming [place]  appointments Provided patient with medical transportation provided by [facility] [phone]-patient physically wrote down number  Patient Goals/Self-Care Activities: Take all medications as prescribed Attend all scheduled provider appointments Call provider office for new concerns or questions  Keep a calendar of all upcoming appointments  Follow Up Plan:  Telephone follow up appointment with care management team member scheduled for:  03/21/23 at 3:30pm      Follow Up:  Patient agrees to Care Plan and Follow-up.  Plan: The Managed Medicaid care management team will reach out to the patient again over the next 30 days.  Date/time of next scheduled RN care management/care coordination outreach:  03/21/23 at 3:30pm  Estanislado Emms RN, BSN [place]  Value-Based Care [facility] RN Care Coordinator [phone]

## 2023-02-19 NOTE — Patient Instructions (Signed)
Visit Information  Courtney Davies was given information about Medicaid Managed Care team care coordination services as a part of their [facility] Community Plan Medicaid benefit. Courtney Davies verbally consented to engagement with the [facility] Managed Care team.   If you are experiencing a medical emergency, please call 911 or report to your local emergency department or urgent care.   If you have a non-emergency medical problem during routine business hours, please contact your provider's office and ask to speak with a nurse.   For questions related to your [facility], please call: [phone] or visit the homepage here: [email or web address]  If you would like to schedule transportation through your [facility], please call the following number at least 2 days in advance of your appointment: [phone]   Rides for urgent appointments can also be made after hours by calling Member Services.  Call the Behavioral Health Crisis Line at [phone], at any time, 24 hours a day, 7 days a week. If you are in danger or need immediate medical attention call 911.  If you would like help to quit smoking, call 1-800-QUIT-NOW ((225)140-7136) OR Espaol: 1-855-Djelo-Ya (0-160-109-3235) o para ms informacin haga clic aqu or Text READY to 573-220 to register via text  Courtney Davies -    Please see education materials related to HF and stroke provided by MyChart link. and as Financial risk analyst.   The patient verbalized understanding of instructions, educational materials, and care plan provided today and agreed to receive a mailed copy of patient instructions, educational materials, and care plan.   Telephone follow up appointment with Managed Medicaid care management team member scheduled for:03/21/23 at 3:30pm  Estanislado Emms RN, BSN [place]  Value-Based Care Institute [facility] RN  Care Coordinator [phone]   Following is a copy of your plan of care:  Care Plan : RN Care Manager Plan of Care  Updates made by Heidi Dach, RN since 02/19/2023 12:00 AM     Problem: Health Management needs related to memory deficits after Anoxic Brain Injury      Long-Range Goal: Development of Plan of Care to address Health Management needs related to memory deficits after Anoxic Brain Injury   Start Date: 02/19/2023  Expected End Date: 05/20/2023  Note:   Current Barriers:  Chronic Disease Management support and education needs related to Anoxic Brain Injury Courtney Davies has difficulty remembering her appointments and medications since anoxic brain injury. Her mother helps with medication management.   RNCM Clinical Goal(s):  Patient will take all medications exactly as prescribed and will call provider for medication related questions as evidenced by patient reports attend all scheduled medical appointments: 02/28/23 with Hem/Onc, 03/04/23 with PT evaluation, 03/06/23 with Pain Management and 03/11/23 with Endocrinology as evidenced by provider documentation continue to work with RN Care Manager to address care management and care coordination needs related to  memory deficits related to Anoxic Brain injury as evidenced by adherence to CM Team Scheduled appointments through collaboration with RN Care manager, provider, and care team.   Interventions: Evaluation of current treatment plan related to  self management and patient's adherence to plan as established by provider   Memory Deficits post Anoxic Brain Injury  (Status:  New goal.)  Long Term Goal Evaluation of current treatment plan related to  memory deficits post Anoxic Brain Injury ,  self-management and patient's adherence to plan as established by provider. Discussed plans with patient for ongoing care management follow  up and provided patient with direct contact information for care management team Advised patient to  take all medications to upcoming appointments Provided education to patient re: heart health Reviewed scheduled/upcoming provider appointments including 02/28/23 with [facility], 03/04/23 for PT evaluation, 03/06/23 with Pain Management and 03/11/23 with [facility] RNCM will mail AVS which will include a copy of upcoming [place] appointments Provided patient with medical transportation provided by [facility] [phone]-patient physically wrote down number  Patient Goals/Self-Care Activities: Take all medications as prescribed Attend all scheduled provider appointments Call provider office for new concerns or questions  Keep a calendar of all upcoming appointments  Follow Up Plan:  Telephone follow up appointment with care management team member scheduled for:  03/21/23 at 3:30pm

## 2023-02-22 ENCOUNTER — Other Ambulatory Visit: Payer: Self-pay | Admitting: Orthopedic Surgery

## 2023-02-28 ENCOUNTER — Telehealth: Payer: BLUE CROSS/BLUE SHIELD

## 2023-02-28 NOTE — Telephone Encounter (Signed)
Patient is calling to see if her Hydroxyzine could be a high dose. Patient states she is Taking the hydroxyzine and another sleeping pill and still not sleeping well.

## 2023-02-28 NOTE — Telephone Encounter (Signed)
I do not recommend increasing dosage. This medication is "as needed" for sleeping. I do not recommend using this medication every night.

## 2023-03-01 ENCOUNTER — Telehealth: Payer: Self-pay

## 2023-03-01 NOTE — Telephone Encounter (Signed)
Patient would like to know is there anything you can recommend that she should take for her to sleep . Because she is having trouble sleeping and have to be up at 4:00 am to work

## 2023-03-01 NOTE — Telephone Encounter (Signed)
Reading, calming music, avoiding bright television/cell phone before bedtime. You may also try melatonin 5 mg supplement over the counter. Your body will adjust with good sleep habits but sometimes it can take a little while.

## 2023-03-01 NOTE — Telephone Encounter (Signed)
Attempted to call patient. Left message on voicemail for patient to return call when available

## 2023-03-01 NOTE — Therapy (Signed)
OUTPATIENT PHYSICAL THERAPY NEURO EVALUATION   Patient Name: Courtney Davies MRN: [id number] DOB:May 01, 1975, 47 y.o., female Today's Date: 03/04/2023   END OF SESSION:  PT End of Session - 03/04/23 1544     Visit Number 1    Date for PT Re-Evaluation 05/13/23    Authorization Type [employer]    Progress Note Due on Visit 10    PT Start Time 1540    PT Stop Time 1620    PT Time Calculation (min) 40 min    Activity Tolerance Patient tolerated treatment well    Behavior During Therapy [facility] for tasks assessed/performed             Past Medical History:  Diagnosis Date   Anemia 09/2015   Anoxic brain injury (HCC)    Arthritis    "knees" (04/23/2017)   Asthma    "teens; went away; came back" (04/23/2017)   B12 deficiency anemia 04/23/2017   Cardiac arrest (HCC)    Chronic bronchitis (HCC)    Chronic low back pain with sciatica    Chronic lower back pain    Diabetes mellitus without complication (HCC)    Diabetes type 2, controlled (HCC)    Elevated ferritin level    Fatty liver    GERD (gastroesophageal reflux disease)    GERD with stricture    Headache    "1-2/wk" (04/23/2017)   History of blood transfusion "plenty"   "related to anemia" (04/23/2017)   Hypertension    Inguinal hernia    Low back pain    Migraine    "1-2/month" (04/23/2017)   OA (osteoarthritis) of knee--left    Paroxysmal atrial fibrillation (HCC)    Sub-clinical -seen on device?, elevated CHADSVASC   Symptomatic anemia 04/23/2017   Vitamin B 12 deficiency    Past Surgical History:  Procedure Laterality Date   ABDOMINAL HYSTERECTOMY  YRS AGO   COMPLETE   CESAREAN SECTION  1998; 2002   ESOPHAGEAL DILATION  02/2020   by Dr Lanae Boast   INGUINAL HERNIA REPAIR Bilateral 1980s    "total of 4 surgeries" (04/23/2017)   IR GASTROSTOMY TUBE MOD SED  12/13/2020   KNEE ARTHROSCOPY WITH MEDIAL MENISECTOMY Left 01/30/2018   Procedure: LEFT KNEE ARTHROSCOPY WITH PARTIAL LATERAL MENISCECTOMY;  Surgeon:  Kathryne Hitch, MD;  Location: [facility];  Service: Orthopedics;  Laterality: Left;   KNEE CARTILAGE SURGERY Left    SUBQ ICD IMPLANT N/A 09/13/2021   Procedure: SUBQ ICD IMPLANT;  Surgeon: [name], MD;  Location: [facility] INVASIVE CV LAB;  Service: Cardiovascular;  Laterality: N/A;   TUBAL LIGATION  2002   Patient Active Problem List   Diagnosis Date Noted   Stroke (HCC) 01/08/2023   Mild intermittent asthma without complication 02/09/2022   Vitamin B12 deficiency 02/09/2022   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   Cough 04/26/2021   Acute blood loss anemia    Controlled type 2 diabetes mellitus with hyperglycemia (HCC)    Essential hypertension    Anoxic brain injury (HCC) 01/02/2021   Fever 12/20/2020   Abdominal wall cellulitis 12/20/2020   Dysphagia    Goals of care, counseling/discussion    Acute systolic heart failure (HCC)    Encephalopathy acute    Type 1 diabetes mellitus without complication (HCC) 12/02/2020   Hypertension 12/02/2020   GERD (gastroesophageal reflux disease) 12/02/2020   Anemia 12/02/2020   Cardiac arrest (HCC) 12/01/2020   Unilateral primary osteoarthritis, left knee 12/01/2018  Lateral meniscus, posterior horn derangement, left 05/14/2018   Status post arthroscopy of left knee 05/14/2018   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/19/2017   Anemia 04/23/2017   Acute bronchitis 04/23/2017   Chronic back pain 10/03/2015   Symptomatic anemia 04/10/2014   Bilateral edema of lower extremity    Low back pain with left-sided sciatica     PCP: [name], NP   REFERRING PROVIDER: Elmer Picker, NP   REFERRING DIAG:  G45.9 (ICD-10-CM) - TIA (transient ischemic attack) G93.1 (ICD-10-CM) - Anoxic brain injury (HCC)    THERAPY DIAG:  Difficulty in walking, not elsewhere classified  Unsteadiness on feet  Muscle weakness (generalized)  Acute pain of left knee  RATIONALE  FOR EVALUATION AND TREATMENT: Rehabilitation  ONSET DATE: 01/08/23 - TIA; 2022 - Anoxic brain injury  NEXT MD VISIT:    SUBJECTIVE:                                                                                                                                                                                                         SUBJECTIVE STATEMENT: Hospital referred me here, they said I had a stroke, I don't now.  My left side hurts every side every day. It ain't right.   Not able to use my lefts side like I use my right side now.    Can't hold anything in my Left hand.   L hand feels numb.    Pt accompanied by: self  PAIN: Are you having pain? Yes: NPRS scale: 8/10 Pain location: L knee  Pain description: throbbing Aggravating factors: standing up, sitting down too long, everything, just waking up, doing too much  Relieving factors: nothing, cut it off  PERTINENT HISTORY:   Anoxic brain injury after V-fib cardiac arrest in 2022 with ICD placement; HTN; DM-II; OA - knees; LBP; GERD; paroxysmal A-fib; migraines; asthma; anxiety  PRECAUTIONS: Fall and ICD/Pacemaker  RED FLAGS: None  WEIGHT BEARING RESTRICTIONS: No  FALLS:  Has patient fallen in last 6 months? No  LIVING ENVIRONMENT: Lives with: lives with their family Lives in: House/apartment Stairs: Yes: External: 3-4 steps; on right going up and on left going up Has following equipment at home: None said she requested a shower chair but has not received.   OCCUPATION: Works at [employer]  PLOF: Independent  PATIENT GOALS: being able to walk without losing my balance, walk normal, get this pain out of me.    Also work on L hand, keep dropping things.    OBJECTIVE: (objective measures completed at initial  evaluation unless otherwise dated)  DIAGNOSTIC FINDINGS:  01/10/23 - MR Brain w/o contrast: IMPRESSION: No evidence of acute intracranial abnormality.  01/09/23 - CT head w/o contrast: IMPRESSION: 1. No  evidence of an acute intracranial abnormality. 2. Minor paranasal sinus disease.  01/08/23 - CT Angio head & neck: IMPRESSION: Negative CTA. No occlusion, stenosis, or irregularity of major arteries in the head and neck.  01/08/23 - CT head (Code Stroke): IMPRESSION: 1. No acute intracranial hemorrhage or evidence of acute large vessel territory infarct. ASPECT score is 10. 2. Possible hyperdense right MCA, though this may be artifactual due to asymmetric streak artifact along the middle cranial fossae. CTA head and neck is recommended for further evaluation.  COGNITION: Overall cognitive status: Impaired   SENSATION: Decreased sensation LUE and LLE, especially below left knee reports numbness  COORDINATION: Impaired coordination UE and LE, unable to do heel to shin on L side, touches eye when does finger to nose with eyes closed, decreased speed and fluidity on L with finger to thumb touches.   POSTURE:  rounded shoulders  LOWER EXTREMITY ROM:     Active  Right eval Left eval  Hip flexion    Hip extension    Hip abduction    Hip adduction    Hip internal rotation    Hip external rotation    Knee flexion    Knee extension    Ankle dorsiflexion    Ankle plantarflexion    Ankle inversion    Ankle eversion     (Blank rows = not tested)  LOWER EXTREMITY MMT:    MMT Right eval Left eval  Hip flexion 4 3+  Hip extension    Hip abduction 4 (Seated) 4 (seated)  Hip adduction 4(seated)  4 (seated)   Knee flexion 4+ 3+  Knee extension 5 3+  Ankle dorsiflexion 4+ 3  Ankle plantarflexion 4 4  (Blank rows = not tested)   GAIT: Distance walked: 50' Assistive device utilized: None Level of assistance: Complete Independence Gait pattern: step to pattern, decreased arm swing- Left, decreased ankle dorsiflexion- Left, shuffling, decreased trunk rotation, and poor foot clearance- Left Comments: decreased gait speed 0.26m/s  FUNCTIONAL TESTS:  30 seconds chair stand test  competed 2, hands on thighs, excessive rocking needed.  Timed up and go (TUG): 36.6 sec  PATIENT SURVEYS:  Stroke Impact Scale 38/80   TODAY'S TREATMENT:   03/04/23 EVAL   PATIENT EDUCATION:  Education details: PT eval findings and anticipated POC  Person educated: Patient Education method: Explanation Education comprehension: verbalized understanding  HOME EXERCISE PROGRAM: TBD   ASSESSMENT:  CLINICAL IMPRESSION: ALBIRTA HEMMINGSEN is a 47 y.o. female who was referred to physical therapy for evaluation and treatment for TIA/ stroke like symptoms on 01/08/23.    Imaging was negative, but she presents today with decreased sensation in LUE and LLE, decreased coordination in LUE and LLE, and decreased strength in LLE (LUE not tested).    [name] presents with physical impairments of impaired activity tolerance, impaired standing balance, impaired ambulation, and decreased safety awareness impacting safe and independent functional mobility. Examination revealed patient is at risk for falls and functional decline as evidenced by the following objective test measures: Gait speed 0.48 m/sec, (74m/sec is needed for community access), 30 sec chair rise of 2 (> 8 indicates increased risk for falls and decreased BLE power). Patient will benefit from skilled physical therapy services to help reach the maximal level of functional independence and mobility. Pt  demonstrates understanding of this plan of care and is in agreement with this plan.   [name] would also benefit from occupational therapy to address deficits in LUE.  Noted dropping items in L hand during evaluation, may have some neglect as well.     OBJECTIVE IMPAIRMENTS: Abnormal gait, decreased activity tolerance, decreased balance, decreased coordination, decreased endurance, decreased knowledge of condition, decreased mobility, difficulty walking, decreased strength, decreased safety awareness, impaired perceived functional  ability, impaired sensation, impaired UE functional use, and pain.   ACTIVITY LIMITATIONS: carrying, lifting, bending, sitting, standing, squatting, stairs, transfers, and locomotion level  PARTICIPATION LIMITATIONS: meal prep, cleaning, laundry, driving, shopping, community activity, and occupation  PERSONAL FACTORS: Age, Past/current experiences, Time since onset of injury/illness/exacerbation, and 3+ comorbidities:  Anoxic brain injury after V-fib cardiac arrest in 2022 with ICD placement; HTN; DM-II; OA - knees; LBP; GERD; paroxysmal A-fib; migraines; asthma; anxiety  are also affecting patient's functional outcome.   REHAB POTENTIAL: Good  CLINICAL DECISION MAKING: Evolving/moderate complexity  EVALUATION COMPLEXITY: Moderate   GOALS: Goals reviewed with patient? Yes  SHORT TERM GOALS: Target date: 04/01/2023  Patient will be independent with initial HEP to improve outcomes and carryover.  Baseline:  Goal status: INITIAL  2.  Patient will be educated on strategies to decrease risk of falls.  Baseline:  Goal status: INITIAL  3.  Patient will demonstrate decreased TUG time to </= 25 sec to decrease risk for falls with transitional mobility. Baseline: 36.6 sec Goal status: INITIAL  LONG TERM GOALS: Target date: 05/13/2023  Patient will be independent with advanced/ongoing HEP to facilitate ability to maintain/progress functional gains from skilled physical therapy services. Baseline:  Goal status: INITIAL  2.  Patient will be able to ambulate 600' with or w/o LRAD on variable surfaces with good safety to access community.  Baseline:  significant gait deviations and increased time needed for 75' Goal status: INITIAL  3.  Patient will be able to step up/down curb safely with LRAD for safety with community ambulation.  Baseline:  Goal status: INITIAL   4.  Patient will demonstrate improved functional LE strength as demonstrated by 4+/5 LLE strength. Baseline: see  objective Goal status: INITIAL  5.  Patient will improve 30 sec chair rise time to 8 for improved efficiency and safety with transfers Baseline: 2 Goal status: INITIAL   6.  Patient will demonstrate gait speed of > 0.8 m/s to be a safe community ambulator with decreased risk for recurrent falls.  Baseline: 0.48 m/s Goal status: INITIAL  7.  Patient will report 50% improvement in LLE pain to improve QOL.  Baseline: 8/10 L knee Goal status: INITIAL  8.  Patient will demonstrate at least 19/24 on DGI to improve gait stability and reduce risk for falls. Baseline: NT Goal status: INITIAL  9. Patient will report 8 points improvement on SIS-16 to demonstrate improved functional ability. Baseline: 20/80 Goal status: INITIAL   PLAN:  PT FREQUENCY: 1-2x/week  PT DURATION: 10 weeks  PLANNED INTERVENTIONS: 97110-Therapeutic exercises, 97530- Therapeutic activity, [id number]- Neuromuscular re-education, 97535- Self Care, 69629- Manual therapy, [id number]- Gait training, 97014- Electrical stimulation (unattended), [phone]- Electrical stimulation (manual), [id number]- Ultrasound, 32440- Ionotophoresis 4mg /ml Dexamethasone, Patient/Family education, Balance training, Stair training, Taping, Dry Needling, Joint mobilization, Joint manipulation, Spinal manipulation, Spinal mobilization, Cryotherapy, and Moist heat  PLAN FOR NEXT SESSION: assess L knee, start HEP for LE strength and balance.    Jena Gauss, PT 03/04/2023, 5:52 PM

## 2023-03-04 ENCOUNTER — Other Ambulatory Visit: Payer: Self-pay

## 2023-03-04 ENCOUNTER — Ambulatory Visit: Payer: Medicaid Other | Attending: Neurology | Admitting: Physical Therapy

## 2023-03-04 ENCOUNTER — Encounter: Payer: Self-pay | Admitting: Physical Therapy

## 2023-03-04 DIAGNOSIS — M25562 Pain in left knee: Secondary | ICD-10-CM | POA: Insufficient documentation

## 2023-03-04 DIAGNOSIS — G931 Anoxic brain damage, not elsewhere classified: Secondary | ICD-10-CM | POA: Insufficient documentation

## 2023-03-04 DIAGNOSIS — R262 Difficulty in walking, not elsewhere classified: Secondary | ICD-10-CM | POA: Insufficient documentation

## 2023-03-04 DIAGNOSIS — G459 Transient cerebral ischemic attack, unspecified: Secondary | ICD-10-CM | POA: Diagnosis not present

## 2023-03-04 DIAGNOSIS — R2681 Unsteadiness on feet: Secondary | ICD-10-CM | POA: Diagnosis present

## 2023-03-04 DIAGNOSIS — M6281 Muscle weakness (generalized): Secondary | ICD-10-CM | POA: Diagnosis present

## 2023-03-05 ENCOUNTER — Encounter: Payer: Self-pay | Admitting: Sports Medicine

## 2023-03-05 ENCOUNTER — Ambulatory Visit (INDEPENDENT_AMBULATORY_CARE_PROVIDER_SITE_OTHER): Payer: Medicaid Other | Admitting: Sports Medicine

## 2023-03-05 VITALS — BP 120/78 | HR 66 | Temp 97.1°F | Resp 17 | Ht 66.0 in | Wt 218.8 lb

## 2023-03-05 DIAGNOSIS — N898 Other specified noninflammatory disorders of vagina: Secondary | ICD-10-CM | POA: Diagnosis not present

## 2023-03-05 DIAGNOSIS — E1165 Type 2 diabetes mellitus with hyperglycemia: Secondary | ICD-10-CM | POA: Diagnosis not present

## 2023-03-05 DIAGNOSIS — R1110 Vomiting, unspecified: Secondary | ICD-10-CM | POA: Diagnosis not present

## 2023-03-05 DIAGNOSIS — R1033 Periumbilical pain: Secondary | ICD-10-CM

## 2023-03-05 LAB — POCT URINALYSIS DIPSTICK
Bilirubin, UA: NEGATIVE
Glucose, UA: POSITIVE — AB
Ketones, UA: POSITIVE
Leukocytes, UA: NEGATIVE
Nitrite, UA: NEGATIVE
Protein, UA: POSITIVE — AB
Spec Grav, UA: 1.015 (ref 1.010–1.025)
Urobilinogen, UA: 1 U/dL
pH, UA: 6 (ref 5.0–8.0)

## 2023-03-05 LAB — GLUCOSE, POCT (MANUAL RESULT ENTRY): POC Glucose: 126 mg/dL — AB (ref 70–99)

## 2023-03-05 MED ORDER — ONDANSETRON 4 MG PO TBDP
4.0000 mg | ORAL_TABLET | Freq: Three times a day (TID) | ORAL | 0 refills | Status: DC | PRN
Start: 2023-03-05 — End: 2023-07-11

## 2023-03-05 NOTE — Patient Instructions (Signed)
Go to Emergency room if symptoms gets worse

## 2023-03-05 NOTE — Progress Notes (Signed)
Careteam: Patient Care Team: Courtney Heir, NP as PCP - General (Adult Health Nurse Practitioner) [name], MD as PCP - Cardiology (Cardiology) [name], MD as PCP - Electrophysiology (Cardiology) [place], [place] [facility]) Marcos Eke, RN as Registered Nurse Shaune Leeks as [facility] Care Management Heidi Dach, RN as [facility] Care Management  PLACE OF SERVICE:  [facility]  Advanced Directive information Does Patient Have a Medical Advance Directive?: No, Would patient like information on creating a medical advance directive?: No - Patient declined  Allergies  Allergen Reactions   Latex Hives   Other Other (See Comments), Anaphylaxis, Swelling and Hives    Hair glue causes throat to close and hives "glue" Hair glue causes throat to close    Codeine Nausea And Vomiting    Was on an empty stomach.   Nitrofurantoin Rash    No chief complaint on file.    HPI: Patient is a 47 y.o. female is here for acute visit for nausea Pt states that she woke up this morning feeling nauseous, dizzy and vomited. She threw up 3 times, non bilious vomitings  C/o lower abdominal pain  since Sunday  Intermittent pain, sharp pain with no radiation  States her urine is yellow in color  Denies dysuria, hematuria, urinary frequency  C/o little bit of vaginal  discharge brownish  H/o hysterectomy many yrs ago  She is sexually active  C/o vaginal itching  Denies fevers, chills.   BG in the clinic 120  States she is compliant with her diabetic medications  Pt threw up once in the clinic    Review of Systems:  Review of Systems  Constitutional:  Negative for chills and fever.  HENT:  Negative for congestion and sore throat.   Eyes:  Negative for double vision.  Respiratory:  Negative for cough, sputum production and shortness of breath.   Cardiovascular:  Negative for chest pain, palpitations and leg swelling.  Gastrointestinal:   Negative for abdominal pain, heartburn and nausea.  Genitourinary:  Negative for dysuria, frequency and hematuria.  Musculoskeletal:  Negative for falls and myalgias.  Neurological:  Negative for dizziness, sensory change and focal weakness.     Past Medical History:  Diagnosis Date   Anemia 09/2015   Anoxic brain injury (HCC)    Arthritis    "knees" (04/23/2017)   Asthma    "teens; went away; came back" (04/23/2017)   B12 deficiency anemia 04/23/2017   Cardiac arrest (HCC)    Chronic bronchitis (HCC)    Chronic low back pain with sciatica    Chronic lower back pain    Diabetes mellitus without complication (HCC)    Diabetes type 2, controlled (HCC)    Elevated ferritin level    Fatty liver    GERD (gastroesophageal reflux disease)    GERD with stricture    Headache    "1-2/wk" (04/23/2017)   History of blood transfusion "plenty"   "related to anemia" (04/23/2017)   Hypertension    Inguinal hernia    Low back pain    Migraine    "1-2/month" (04/23/2017)   OA (osteoarthritis) of knee--left    Paroxysmal atrial fibrillation (HCC)    Sub-clinical -seen on device?, elevated CHADSVASC   Symptomatic anemia 04/23/2017   Vitamin B 12 deficiency    Past Surgical History:  Procedure Laterality Date   ABDOMINAL HYSTERECTOMY  YRS AGO   COMPLETE   CESAREAN SECTION  1998; 2002  ESOPHAGEAL DILATION  02/2020   by Dr Lillia Dallas HERNIA REPAIR Bilateral 1980s    "total of 4 surgeries" (04/23/2017)   IR GASTROSTOMY TUBE MOD SED  12/13/2020   KNEE ARTHROSCOPY WITH MEDIAL MENISECTOMY Left 01/30/2018   Procedure: LEFT KNEE ARTHROSCOPY WITH PARTIAL LATERAL MENISCECTOMY;  Surgeon: Kathryne Hitch, MD;  Location: [facility];  Service: Orthopedics;  Laterality: Left;   KNEE CARTILAGE SURGERY Left    SUBQ ICD IMPLANT N/A 09/13/2021   Procedure: SUBQ ICD IMPLANT;  Surgeon: [name], MD;  Location: [facility] INVASIVE CV LAB;  Service: Cardiovascular;  Laterality: N/A;   TUBAL LIGATION   2002   Social History:   reports that she has quit smoking. Her smoking use included cigarettes. She has never used smokeless tobacco. She reports current alcohol use. She reports current drug use. Drug: Marijuana.  Family History  Problem Relation Age of Onset   Stroke Mother    Diabetes Mother    Diabetes Father    Stroke Maternal Grandmother    Diabetes Maternal Grandmother    Anemia Paternal Grandmother    Valvular heart disease Paternal Grandmother    Hypertension Mother    Prostate cancer Maternal Uncle        ? intestinal also    Medications: Patient's Medications  New Prescriptions   No medications on file  Previous Medications   ACETAMINOPHEN (TYLENOL) 500 MG TABLET    Take 1 tablet (500 mg total) by mouth every 6 (six) hours as needed.   ALBUTEROL (VENTOLIN HFA) 108 (90 BASE) MCG/ACT INHALER    Inhale 2 puffs into the lungs every 6 (six) hours as needed for wheezing or shortness of breath.   APIXABAN (ELIQUIS) 5 MG TABS TABLET    Take 1 tablet (5 mg total) by mouth 2 (two) times daily.   ATORVASTATIN (LIPITOR) 40 MG TABLET    Take 1 tablet (40 mg total) by mouth daily.   BD PEN NEEDLE NANO 2ND GEN 32G X 4 MM MISC    3 (three) times daily. as directed   CARVEDILOL (COREG) 6.25 MG TABLET    TAKE 1 TABLET BY MOUTH TWICE DAILY   CONTINUOUS GLUCOSE RECEIVER (DEXCOM G7 RECEIVER) DEVI    Dispense 1 receiver   CONTINUOUS GLUCOSE SENSOR (DEXCOM G7 SENSOR) MISC    Use 1 sensor every 10 days.   DICLOFENAC SODIUM (VOLTAREN) 1 % GEL    Apply 4 g topically 4 (four) times daily. Apply to left knee   EMPAGLIFLOZIN (JARDIANCE) 10 MG TABS TABLET    Take 1 tablet (10 mg total) by mouth every morning.   HYDROXYZINE (VISTARIL) 25 MG CAPSULE    TAKE 1 CAPSULE(25 MG) BY MOUTH AT BEDTIME AS NEEDED   INSULIN SYRINGE-NEEDLE U-100 (INSULIN SYRINGE .5CC/31GX5/16") 31G X 5/16" 0.5 ML MISC    3 (three) times daily.   LOSARTAN (COZAAR) 25 MG TABLET    TAKE 1 TABLET BY MOUTH DAILY   METFORMIN  (FORTAMET) 500 MG (OSM) 24 HR TABLET    Take 1,000 mg by mouth daily with breakfast.   METRONIDAZOLE (METROGEL) 0.75 % VAGINAL GEL    Place 1 Applicatorful vaginally daily. For 5 days   OZEMPIC, 0.25 OR 0.5 MG/DOSE, 2 MG/3ML SOPN    Inject 0.5 mg into the skin once a week.   QUETIAPINE (SEROQUEL) 25 MG TABLET    TAKE 1 TABLET(25 MG) BY MOUTH AT BEDTIME   RIVASTIGMINE (EXELON) 3 MG CAPSULE    TAKE 1  CAPSULE(3 MG) BY MOUTH TWICE DAILY   SERTRALINE (ZOLOFT) 50 MG TABLET    Take 1 tablet (50 mg total) by mouth daily.  Modified Medications   No medications on file  Discontinued Medications   ACCU-CHEK GUIDE TEST STRIP       BLOOD GLUCOSE MONITORING SUPPL (ACCU-CHEK GUIDE ME) W/DEVICE KIT        Physical Exam:  Vitals:   03/05/23 1302  BP: 120/78  Pulse: 66  Resp: 17  Temp: (!) 97.1 F (36.2 C)  SpO2: 98%  Weight: 218 lb 12.8 oz (99.2 kg)  Height: 5\' 6"  (1.676 m)   Body mass index is 35.32 kg/m. Wt Readings from Last 3 Encounters:  03/05/23 218 lb 12.8 oz (99.2 kg)  01/24/23 232 lb (105.2 kg)  01/15/23 225 lb 3.2 oz (102.2 kg)    Physical Exam Constitutional:      Appearance: Normal appearance.  HENT:     Head: Normocephalic and atraumatic.  Cardiovascular:     Rate and Rhythm: Normal rate and regular rhythm.     Heart sounds: No murmur heard. Pulmonary:     Effort: Pulmonary effort is normal. No respiratory distress.     Breath sounds: Normal breath sounds. No wheezing.  Abdominal:     General: Bowel sounds are normal. There is no distension.     Tenderness: There is abdominal tenderness (mid , lower abdominal pain). There is no guarding or rebound.  Musculoskeletal:        General: No swelling or tenderness.  Skin:    General: Skin is dry.  Neurological:     Mental Status: She is alert. Mental status is at baseline.     Sensory: No sensory deficit.     Motor: No weakness.      Labs reviewed: Basic Metabolic Panel: Recent Labs    12/06/22 1052 01/08/23 1117  01/10/23 1115  NA 136 138 137  K 4.1 3.5 4.0  CL 104 106 109  CO2 21 20* 19*  GLUCOSE 127 100* 104*  BUN 11 10 13   CREATININE 0.91 0.76 0.83  CALCIUM 9.1 9.0 9.2  TSH 0.44  --   --    Liver Function Tests: Recent Labs    12/06/22 1052 01/08/23 1117  AST 14 22  ALT 10 19  ALKPHOS  --  97  BILITOT 0.2 0.4  PROT 7.1 7.0  ALBUMIN  --  3.6   No results for input(s): "LIPASE", "AMYLASE" in the last 8760 hours. No results for input(s): "AMMONIA" in the last 8760 hours. CBC: Recent Labs    12/06/22 1052 01/08/23 0928 01/10/23 1115  WBC 5.9 5.7 5.2  NEUTROABS 2,755 2.3  --   HGB 13.5 13.9 14.9  HCT 44.4 44.3 47.5*  MCV 73.3* 72.6* 71.8*  PLT 263 227 282   Lipid Panel: Recent Labs    01/09/23 0454  CHOL 159  HDL 38*  LDLCALC 104*  TRIG 85  CHOLHDL 4.2   TSH: Recent Labs    12/06/22 1052  TSH 0.44   A1C: Lab Results  Component Value Date   HGBA1C 7.1 (H) 01/09/2023     Assessment/Plan  1. Vomiting, unspecified vomiting type, unspecified whether nausea present  Pt with abdominal pain, nausea, vomiting  UA dip neg  Urine sent for GC, chlamydia Instructed patient to go to ED if her symptoms gets worse - ondansetron (ZOFRAN-ODT) 4 MG disintegrating tablet; Take 1 tablet (4 mg total) by mouth every 8 (eight) hours as needed  for nausea or vomiting.  Dispense: 20 tablet; Refill: 0  2. Controlled type 2 diabetes mellitus with hyperglycemia, without long-term current use of insulin (HCC) BG 120 in the clinic Will check cbc, bmp  3. Vaginal discharge   - SureSwab Advanced Bacterial Vaginosis (BV), TMA - Chlamydia/Neisseria Gonorrhoeae RNA,TMA,Urogenital - Culture, Urine  4.  Generalized abdominal pain Pt with generalized abdominal pain with nausea, vomiting  Will check cbc, bmp, lipase Will order CT abdomen, pelvis Instructed to go to ED is her symptoms worsens - CBC With Differential/Platelet - Basic Metabolic Panel with eGFR - Lipase - ondansetron  (ZOFRAN-ODT) 4 MG disintegrating tablet; Take 1 tablet (4 mg total) by mouth every 8 (eight) hours as needed for nausea or vomiting.  Dispense: 20 tablet; Refill: 0 - CT ABDOMEN PELVIS W CONTRAST; Future  Other orders - Continuous Glucose Sensor (DEXCOM G7 SENSOR) MISC; Use 1 sensor every 10 days. - Continuous Glucose Receiver (DEXCOM G7 RECEIVER) DEVI; Dispense 1 receiver   No follow-ups on file.:     45 Total time spent for obtaining history,  performing a medically appropriate examination and evaluation, reviewing the tests, counseling and educating the patient/family/caregiver, ordering medications, tests, or procedures, , documenting clinical information in the electronic or other health record, independently interpreting results and communicating results to the patient/family/caregiver, care coordination (not separately reported)

## 2023-03-06 ENCOUNTER — Ambulatory Visit: Payer: Medicaid Other | Admitting: Adult Health

## 2023-03-06 ENCOUNTER — Encounter: Payer: Self-pay | Admitting: Physical Medicine & Rehabilitation

## 2023-03-06 ENCOUNTER — Other Ambulatory Visit: Payer: Medicaid Other

## 2023-03-06 ENCOUNTER — Encounter: Payer: Medicaid Other | Attending: Physical Medicine & Rehabilitation | Admitting: Physical Medicine & Rehabilitation

## 2023-03-06 DIAGNOSIS — F321 Major depressive disorder, single episode, moderate: Secondary | ICD-10-CM | POA: Diagnosis present

## 2023-03-06 LAB — CBC WITH DIFFERENTIAL/PLATELET
Absolute Lymphocytes: 2746 {cells}/uL (ref 850–3900)
Absolute Monocytes: 525 {cells}/uL (ref 200–950)
Basophils Absolute: 38 {cells}/uL (ref 0–200)
Basophils Relative: 0.6 %
Eosinophils Absolute: 154 {cells}/uL (ref 15–500)
Eosinophils Relative: 2.4 %
HCT: 46.3 % — ABNORMAL HIGH (ref 35.0–45.0)
Hemoglobin: 13.6 g/dL (ref 11.7–15.5)
MCH: 21.9 pg — ABNORMAL LOW (ref 27.0–33.0)
MCHC: 29.4 g/dL — ABNORMAL LOW (ref 32.0–36.0)
MCV: 74.4 fL — ABNORMAL LOW (ref 80.0–100.0)
MPV: 11.1 fL (ref 7.5–12.5)
Monocytes Relative: 8.2 %
Neutro Abs: 2938 {cells}/uL (ref 1500–7800)
Neutrophils Relative %: 45.9 %
Platelets: 298 10*3/uL (ref 140–400)
RBC: 6.22 10*6/uL — ABNORMAL HIGH (ref 3.80–5.10)
RDW: 13.9 % (ref 11.0–15.0)
Total Lymphocyte: 42.9 %
WBC: 6.4 10*3/uL (ref 3.8–10.8)

## 2023-03-06 LAB — LIPASE: Lipase: 29 U/L (ref 7–60)

## 2023-03-06 LAB — BASIC METABOLIC PANEL WITH GFR
BUN: 13 mg/dL (ref 7–25)
CO2: 26 mmol/L (ref 20–32)
Calcium: 9.3 mg/dL (ref 8.6–10.2)
Chloride: 106 mmol/L (ref 98–110)
Creat: 0.7 mg/dL (ref 0.50–0.99)
Glucose, Bld: 106 mg/dL — ABNORMAL HIGH (ref 65–99)
Potassium: 4 mmol/L (ref 3.5–5.3)
Sodium: 139 mmol/L (ref 135–146)
eGFR: 108 mL/min/{1.73_m2} (ref >=60)

## 2023-03-06 MED ORDER — QUETIAPINE FUMARATE 50 MG PO TABS: 50.0000 mg | ORAL_TABLET | Freq: Every day | ORAL | 3 refills | Status: DC

## 2023-03-06 NOTE — Patient Instructions (Addendum)
SLEEP:   SEROQUEL 50MG  AT BEDTIME. TRY WITHOUT MELATONIN FIRST TO SEE HOW YOU DO.   IF NEEDED, THEN YOU CAN ADD 10MG  MELATONIN ALONG WITH THE SEROQUEL.     YOU CAN ASK YOUR BOSS IF PART TIME OR LESSER HOURS ARE AVAILABLE OR IF ANY ACCOMMODATIONS COULD BE MADE TO YOUR JOB.

## 2023-03-06 NOTE — Progress Notes (Signed)
Subjective:    Patient ID: Courtney Davies, female    DOB: 07/12/1975, 47 y.o.   MRN: [id number]  HPI  Courtney Davies is here in follow up of her anoxic brain injury.  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  She usually has to get to work early in the morning and is waking up at 4 AM.  Unfortunately she has had problems sleeping.  Seroquel is no longer helping and she started taking some melatonin as well as over-the-counter sleep aids.  She is taking 20 mg of melatonin currently.  She typically goes to bed around 7 or 8 PM each night.  She notes that sometimes it is a struggle the following day from energy standpoint and also her mood can be quite labile.  She has not talked to her boss about any adjustment to her hours or part-time work as of yet.   She works in [employer].  Pain Inventory Average Pain 10 Pain Right Now 10 My pain is constant and stabbing  In the last 24 hours, has pain interfered with the following? General activity 10 Relation with others 10 Enjoyment of life 10 What TIME of day is your pain at its worst? morning , daytime, evening, and night Sleep (in general) Poor  Pain is worse with: unsure Pain improves with: medication Relief from Meds: 2  Family History  Problem Relation Age of Onset   Stroke Mother    Diabetes Mother    Diabetes Father    Stroke Maternal Grandmother    Diabetes Maternal Grandmother    Anemia Paternal Grandmother    Valvular heart disease Paternal Grandmother    Hypertension Mother    Prostate cancer Maternal Uncle        ? intestinal also   Social History   Socioeconomic History   Marital status: Single    Spouse name: Not on file   Number of children: 2   Years of education: 11th   Highest education level: Not on file  Occupational History   Occupation: salad maker in restaurant  Tobacco Use   Smoking status: Former    Types: Cigarettes   Smokeless tobacco: Never  Vaping Use   Vaping status:  Never Used  Substance and Sexual Activity   Alcohol use: Yes    Comment: occ   Drug use: Yes    Types: Marijuana    Comment: sometimes   Sexual activity: Not Currently    Birth control/protection: Surgical  Other Topics Concern   Not on file  Social History Narrative   ** Merged History Encounter **       Lives at home with her two children. Occasional caffeine use. Right-handed.   Social Determinants of Health   Financial Resource Strain: Not on file  Food Insecurity: Food Insecurity Present (01/29/2023)   Hunger Vital Sign    Worried About Running Out of Food in the Last Year: Often true    Ran Out of Food in the Last Year: Often true  Transportation Needs: Unmet Transportation Needs (01/29/2023)   PRAPARE - Administrator, Civil Service (Medical): Yes    Lack of Transportation (Non-Medical): Yes  Physical Activity: Not on file  Stress: Not on file  Social Connections: Not on file   Past Surgical History:  Procedure Laterality Date   ABDOMINAL HYSTERECTOMY  YRS AGO   COMPLETE   CESAREAN SECTION  1998; 2002   ESOPHAGEAL DILATION  02/2020  by Dr Lillia Dallas HERNIA REPAIR Bilateral 1980s    "total of 4 surgeries" (04/23/2017)   IR GASTROSTOMY TUBE MOD SED  12/13/2020   KNEE ARTHROSCOPY WITH MEDIAL MENISECTOMY Left 01/30/2018   Procedure: LEFT KNEE ARTHROSCOPY WITH PARTIAL LATERAL MENISCECTOMY;  Surgeon: Kathryne Hitch, MD;  Location: [facility];  Service: Orthopedics;  Laterality: Left;   KNEE CARTILAGE SURGERY Left    SUBQ ICD IMPLANT N/A 09/13/2021   Procedure: SUBQ ICD IMPLANT;  Surgeon: [name], MD;  Location: [facility] INVASIVE CV LAB;  Service: Cardiovascular;  Laterality: N/A;   TUBAL LIGATION  2002   Past Surgical History:  Procedure Laterality Date   ABDOMINAL HYSTERECTOMY  YRS AGO   COMPLETE   CESAREAN SECTION  1998; 2002   ESOPHAGEAL DILATION  02/2020   by Dr Lillia Dallas HERNIA REPAIR Bilateral 1980s    "total of 4  surgeries" (04/23/2017)   IR GASTROSTOMY TUBE MOD SED  12/13/2020   KNEE ARTHROSCOPY WITH MEDIAL MENISECTOMY Left 01/30/2018   Procedure: LEFT KNEE ARTHROSCOPY WITH PARTIAL LATERAL MENISCECTOMY;  Surgeon: Kathryne Hitch, MD;  Location: [facility];  Service: Orthopedics;  Laterality: Left;   KNEE CARTILAGE SURGERY Left    SUBQ ICD IMPLANT N/A 09/13/2021   Procedure: SUBQ ICD IMPLANT;  Surgeon: [name], MD;  Location: [facility] INVASIVE CV LAB;  Service: Cardiovascular;  Laterality: N/A;   TUBAL LIGATION  2002   Past Medical History:  Diagnosis Date   Anemia 09/2015   Anoxic brain injury (HCC)    Arthritis    "knees" (04/23/2017)   Asthma    "teens; went away; came back" (04/23/2017)   B12 deficiency anemia 04/23/2017   Cardiac arrest (HCC)    Chronic bronchitis (HCC)    Chronic low back pain with sciatica    Chronic lower back pain    Diabetes mellitus without complication (HCC)    Diabetes type 2, controlled (HCC)    Elevated ferritin level    Fatty liver    GERD (gastroesophageal reflux disease)    GERD with stricture    Headache    "1-2/wk" (04/23/2017)   History of blood transfusion "plenty"   "related to anemia" (04/23/2017)   Hypertension    Inguinal hernia    Low back pain    Migraine    "1-2/month" (04/23/2017)   OA (osteoarthritis) of knee--left    Paroxysmal atrial fibrillation (HCC)    Sub-clinical -seen on device?, elevated CHADSVASC   Symptomatic anemia 04/23/2017   Vitamin B 12 deficiency    Ht 5\' 6"  (1.676 m)   Wt 220 lb (99.8 kg)   LMP 12/03/2012   BMI 35.51 kg/m   Opioid Risk Score:   Fall Risk Score:  `1  Depression screen [facility] 2/9     03/06/2023   11:14 AM 12/06/2022   10:41 AM 08/15/2022    1:33 PM 03/01/2022   10:42 AM 11/08/2021    2:32 PM 05/03/2021   11:29 AM 03/08/2021   11:28 AM  Depression screen PHQ 2/9  Decreased Interest 0 1 2 2 2 1  0  Down, Depressed, Hopeless 0 0 1 3 2 1 1   PHQ - 2 Score 0 1 3 5 4 2 1   Altered sleeping   3 3    1   Tired, decreased energy   3 3   0  Change in appetite   0 1   0  Feeling bad or failure about yourself  1 3   2   Trouble concentrating   1 3   0  Moving slowly or fidgety/restless   0 1   0  Suicidal thoughts   1 3 3   0  PHQ-9 Score   12 22   4   Difficult doing work/chores    Extremely dIfficult   Not difficult at all      Review of Systems  All other systems reviewed and are negative.     Objective:   Physical Exam  General: No acute distress HEENT: NCAT, EOMI, oral membranes moist Cards: reg rate  Chest: normal effort Abdomen: Soft, NT, ND Skin: dry, intact Extremities: no edema Psych: labile but generally pleasant.   Skin: intact Neuro: Patient is alert and oriented with time.  Cranial nerve exam is unremarkable.  can be a little distracted. ,improved short term memory   fair  insight. Iproved awareness. Motor 5/5. Sensory normal.  Musculoskeletal: antalgic LLE   Assessment & Plan:  1.  Decline in ADL, mobility , and cognition secondary to anoxic brain injury -Discussed the fact that her job is putting increased strain on her brain and the fact that she is not sleeping well is not helping either to cause her to feel tired and irritable during the day.  I expressed that the goal for sleep should be about 8 hours in her circumstance.  iN honesty had be happy with 6 hours plus. -I'm ok with her driving -ok to continue exelon 3mg  bid 2. Left knee pain: mgt per ortho             voltaren gel prn.  4. Mood/sleep:               -increase seroquel to 50mg  at night for sleep/mood stability  -May take melatonin 10 mg with the Seroquel if needed but she should try the Seroquel alone initially.  I like her to avoid the over-the-counter sleep aids. -continue zoloft for mood.   -church involvement 5. T2DM per primary?: 6. HTN: 11. SVT/NSVT: On coreg and amiodarone bid for rate control.               -s/p ICD/pacer   -continue per cardiology.    20 minutes of face to face  patient care time were spent during this visit. All questions were encouraged and answered.  Follow up with me in 3 mos. I encouraged her to call me if any issues prior to her follow-up visit.

## 2023-03-07 ENCOUNTER — Telehealth: Payer: Self-pay | Admitting: Sports Medicine

## 2023-03-07 ENCOUNTER — Encounter: Payer: Self-pay | Admitting: Orthopedic Surgery

## 2023-03-07 ENCOUNTER — Ambulatory Visit (INDEPENDENT_AMBULATORY_CARE_PROVIDER_SITE_OTHER): Payer: Medicaid Other | Admitting: Orthopedic Surgery

## 2023-03-07 ENCOUNTER — Other Ambulatory Visit: Payer: Self-pay | Admitting: Sports Medicine

## 2023-03-07 VITALS — BP 106/76 | HR 62 | Temp 97.1°F | Resp 17 | Ht 66.0 in | Wt 222.2 lb

## 2023-03-07 DIAGNOSIS — N760 Acute vaginitis: Secondary | ICD-10-CM | POA: Diagnosis not present

## 2023-03-07 DIAGNOSIS — R110 Nausea: Secondary | ICD-10-CM

## 2023-03-07 DIAGNOSIS — R300 Dysuria: Secondary | ICD-10-CM | POA: Diagnosis not present

## 2023-03-07 DIAGNOSIS — I639 Cerebral infarction, unspecified: Secondary | ICD-10-CM | POA: Diagnosis not present

## 2023-03-07 DIAGNOSIS — E1169 Type 2 diabetes mellitus with other specified complication: Secondary | ICD-10-CM

## 2023-03-07 DIAGNOSIS — B9689 Other specified bacterial agents as the cause of diseases classified elsewhere: Secondary | ICD-10-CM

## 2023-03-07 DIAGNOSIS — E785 Hyperlipidemia, unspecified: Secondary | ICD-10-CM

## 2023-03-07 DIAGNOSIS — R1033 Periumbilical pain: Secondary | ICD-10-CM

## 2023-03-07 LAB — URINE CULTURE
MICRO NUMBER:: 15752154
SPECIMEN QUALITY:: ADEQUATE

## 2023-03-07 MED ORDER — CEPHALEXIN 500 MG PO CAPS: 500.0000 mg | ORAL_CAPSULE | Freq: Four times a day (QID) | ORAL | 0 refills | Status: AC

## 2023-03-07 MED ORDER — METRONIDAZOLE 500 MG PO TABS: 500.0000 mg | ORAL_TABLET | Freq: Two times a day (BID) | ORAL | 0 refills | Status: AC

## 2023-03-07 MED ORDER — ATORVASTATIN CALCIUM 40 MG PO TABS
40.0000 mg | ORAL_TABLET | Freq: Every day | ORAL | 1 refills | Status: DC
Start: 2023-03-07 — End: 2023-04-26

## 2023-03-07 NOTE — Patient Instructions (Addendum)
Will send Keflex to treat "as if" you have a urinary infection  Flagyl to treat bacterial vaginosis> vaginal infection  Zofran> medication for nausea  Please eat soups, toast, apple sauce, ginger ale> EMPTY STOMACH CREATES SICK STOMACH> EATING WILL GIVE YOU ENERGY AND CALORIES  Injectable medication probably causing lack of hunger> you will need to make yourself eat

## 2023-03-07 NOTE — Progress Notes (Signed)
Careteam: Patient Care Team: [name], NP as PCP - General (Adult Health Nurse Practitioner) [name], MD as PCP - Cardiology (Cardiology) [name], MD as PCP - Electrophysiology (Cardiology) [place], [place] [facility]) Marcos Eke, RN as Registered Nurse Shaune Leeks as [facility] Care Management Heidi Dach, RN as [facility] Care Management  Seen by: Hazle Nordmann, AGNP-C  PLACE OF SERVICE:  [facility]  Advanced Directive information    Allergies  Allergen Reactions   Latex Hives   Other Other (See Comments), Anaphylaxis, Swelling and Hives    Hair glue causes throat to close and hives "glue" Hair glue causes throat to close    Codeine Nausea And Vomiting    Was on an empty stomach.   Nitrofurantoin Rash    Chief Complaint  Patient presents with   Follow-up    Patient needs treatment for current labs.      HPI: Patient is a 47 y.o. female seen today for acute visit due to ongoing dysuria.   11/19 she was seen due to periumbilical abdominal pain, nausea, and vaginal discharge.   Abdominal pain intermittent. Recent lipase 29. Nausea has subsided. She admits to having no appetite. She is taking Ozempic. Admits friends at work telling her to eat. They have given her soups and crackers and abdominal pain/nausea improved. CT abdomen was ordered> she has yet to schedule.   Vaginal swab + for BV. Flagyl prescribed earlier today, she has not started.   Urine culture revealed E.coli > 10,000-49,000 cfu/ml. She continues to have dysuria when urinating.   Review of Systems:  Review of Systems  Constitutional: Negative.   HENT: Negative.    Eyes: Negative.   Respiratory: Negative.    Cardiovascular: Negative.   Gastrointestinal:  Positive for abdominal pain and nausea.  Genitourinary:  Positive for dysuria and frequency. Negative for flank pain and hematuria.  Musculoskeletal: Negative.   Skin: Negative.    Neurological: Negative.   Psychiatric/Behavioral: Negative.      Past Medical History:  Diagnosis Date   Anemia 09/2015   Anoxic brain injury (HCC)    Arthritis    "knees" (04/23/2017)   Asthma    "teens; went away; came back" (04/23/2017)   B12 deficiency anemia 04/23/2017   Cardiac arrest (HCC)    Chronic bronchitis (HCC)    Chronic low back pain with sciatica    Chronic lower back pain    Diabetes mellitus without complication (HCC)    Diabetes type 2, controlled (HCC)    Elevated ferritin level    Fatty liver    GERD (gastroesophageal reflux disease)    GERD with stricture    Headache    "1-2/wk" (04/23/2017)   History of blood transfusion "plenty"   "related to anemia" (04/23/2017)   Hypertension    Inguinal hernia    Low back pain    Migraine    "1-2/month" (04/23/2017)   OA (osteoarthritis) of knee--left    Paroxysmal atrial fibrillation (HCC)    Sub-clinical -seen on device?, elevated CHADSVASC   Symptomatic anemia 04/23/2017   Vitamin B 12 deficiency    Past Surgical History:  Procedure Laterality Date   ABDOMINAL HYSTERECTOMY  YRS AGO   COMPLETE   CESAREAN SECTION  1998; 2002   ESOPHAGEAL DILATION  02/2020   by Dr Lanae Boast   INGUINAL HERNIA REPAIR Bilateral 1980s    "total of 4 surgeries" (04/23/2017)   IR GASTROSTOMY TUBE MOD SED  12/13/2020   KNEE ARTHROSCOPY WITH MEDIAL MENISECTOMY Left 01/30/2018   Procedure: LEFT KNEE ARTHROSCOPY WITH PARTIAL LATERAL MENISCECTOMY;  Surgeon: Kathryne Hitch, MD;  Location: [facility];  Service: Orthopedics;  Laterality: Left;   KNEE CARTILAGE SURGERY Left    SUBQ ICD IMPLANT N/A 09/13/2021   Procedure: SUBQ ICD IMPLANT;  Surgeon: [name], MD;  Location: [facility] INVASIVE CV LAB;  Service: Cardiovascular;  Laterality: N/A;   TUBAL LIGATION  2002   Social History:   reports that she has quit smoking. Her smoking use included cigarettes. She has never used smokeless tobacco. She reports current alcohol use. She  reports current drug use. Drug: Marijuana.  Family History  Problem Relation Age of Onset   Stroke Mother    Diabetes Mother    Diabetes Father    Stroke Maternal Grandmother    Diabetes Maternal Grandmother    Anemia Paternal Grandmother    Valvular heart disease Paternal Grandmother    Hypertension Mother    Prostate cancer Maternal Uncle        ? intestinal also    Medications: Patient's Medications  New Prescriptions   No medications on file  Previous Medications   ACETAMINOPHEN (TYLENOL) 500 MG TABLET    Take 1 tablet (500 mg total) by mouth every 6 (six) hours as needed.   ALBUTEROL (VENTOLIN HFA) 108 (90 BASE) MCG/ACT INHALER    Inhale 2 puffs into the lungs every 6 (six) hours as needed for wheezing or shortness of breath.   APIXABAN (ELIQUIS) 5 MG TABS TABLET    Take 1 tablet (5 mg total) by mouth 2 (two) times daily.   ATORVASTATIN (LIPITOR) 40 MG TABLET    Take 1 tablet (40 mg total) by mouth daily.   BD PEN NEEDLE NANO 2ND GEN 32G X 4 MM MISC    3 (three) times daily. as directed   CARVEDILOL (COREG) 6.25 MG TABLET    TAKE 1 TABLET BY MOUTH TWICE DAILY   CONTINUOUS GLUCOSE RECEIVER (DEXCOM G7 RECEIVER) DEVI    Dispense 1 receiver   CONTINUOUS GLUCOSE SENSOR (DEXCOM G7 SENSOR) MISC    Use 1 sensor every 10 days.   DICLOFENAC SODIUM (VOLTAREN) 1 % GEL    Apply 4 g topically 4 (four) times daily. Apply to left knee   EMPAGLIFLOZIN (JARDIANCE) 10 MG TABS TABLET    Take 1 tablet (10 mg total) by mouth every morning.   HYDROXYZINE (VISTARIL) 25 MG CAPSULE    TAKE 1 CAPSULE(25 MG) BY MOUTH AT BEDTIME AS NEEDED   INSULIN SYRINGE-NEEDLE U-100 (INSULIN SYRINGE .5CC/31GX5/16") 31G X 5/16" 0.5 ML MISC    3 (three) times daily.   LOSARTAN (COZAAR) 25 MG TABLET    TAKE 1 TABLET BY MOUTH DAILY   METFORMIN (FORTAMET) 500 MG (OSM) 24 HR TABLET    Take 1,000 mg by mouth daily with breakfast.   METRONIDAZOLE (FLAGYL) 500 MG TABLET    Take 1 tablet (500 mg total) by mouth 2 (two) times  daily for 7 days.   METRONIDAZOLE (METROGEL) 0.75 % VAGINAL GEL    Place 1 Applicatorful vaginally daily. For 5 days   ONDANSETRON (ZOFRAN-ODT) 4 MG DISINTEGRATING TABLET    Take 1 tablet (4 mg total) by mouth every 8 (eight) hours as needed for nausea or vomiting.   OZEMPIC, 0.25 OR 0.5 MG/DOSE, 2 MG/3ML SOPN    Inject 0.5 mg into the skin once a week.   QUETIAPINE (SEROQUEL) 50 MG TABLET  Take 1 tablet (50 mg total) by mouth at bedtime.   RIVASTIGMINE (EXELON) 3 MG CAPSULE    TAKE 1 CAPSULE(3 MG) BY MOUTH TWICE DAILY   SERTRALINE (ZOLOFT) 50 MG TABLET    Take 1 tablet (50 mg total) by mouth daily.  Modified Medications   No medications on file  Discontinued Medications   No medications on file    Physical Exam:  There were no vitals filed for this visit. There is no height or weight on file to calculate BMI. Wt Readings from Last 3 Encounters:  03/06/23 220 lb (99.8 kg)  03/05/23 218 lb 12.8 oz (99.2 kg)  01/24/23 232 lb (105.2 kg)    Physical Exam Vitals reviewed.  Constitutional:      General: She is not in acute distress. HENT:     Head: Normocephalic.  Eyes:     General:        Right eye: No discharge.        Left eye: No discharge.  Cardiovascular:     Rate and Rhythm: Normal rate and regular rhythm.     Pulses: Normal pulses.     Heart sounds: Normal heart sounds.  Pulmonary:     Effort: Pulmonary effort is normal.     Breath sounds: Normal breath sounds.  Abdominal:     General: Bowel sounds are normal. There is no distension.     Palpations: Abdomen is soft. There is no mass.     Tenderness: There is no abdominal tenderness. There is no right CVA tenderness, left CVA tenderness, guarding or rebound.     Hernia: No hernia is present.  Musculoskeletal:     Cervical back: Neck supple.     Right lower leg: No edema.     Left lower leg: No edema.  Skin:    General: Skin is warm.     Capillary Refill: Capillary refill takes less than 2 seconds.  Neurological:      General: No focal deficit present.     Mental Status: She is alert and oriented to person, place, and time.  Psychiatric:        Mood and Affect: Mood normal.     Labs reviewed: Basic Metabolic Panel: Recent Labs    12/06/22 1052 01/08/23 1117 01/10/23 1115 03/05/23 1415  NA 136 138 137 139  K 4.1 3.5 4.0 4.0  CL 104 106 109 106  CO2 21 20* 19* 26  GLUCOSE 127 100* 104* 106*  BUN 11 10 13 13   CREATININE 0.91 0.76 0.83 0.70  CALCIUM 9.1 9.0 9.2 9.3  TSH 0.44  --   --   --    Liver Function Tests: Recent Labs    12/06/22 1052 01/08/23 1117  AST 14 22  ALT 10 19  ALKPHOS  --  97  BILITOT 0.2 0.4  PROT 7.1 7.0  ALBUMIN  --  3.6   Recent Labs    03/05/23 1415  LIPASE 29   No results for input(s): "AMMONIA" in the last 8760 hours. CBC: Recent Labs    12/06/22 1052 01/08/23 0928 01/10/23 1115 03/05/23 1415  WBC 5.9 5.7 5.2 6.4  NEUTROABS 2,755 2.3  --  2,938  HGB 13.5 13.9 14.9 13.6  HCT 44.4 44.3 47.5* 46.3*  MCV 73.3* 72.6* 71.8* 74.4*  PLT 263 227 282 298   Lipid Panel: Recent Labs    01/09/23 0454  CHOL 159  HDL 38*  LDLCALC 104*  TRIG 85  CHOLHDL 4.2  TSH: Recent Labs    12/06/22 1052  TSH 0.44   A1C: Lab Results  Component Value Date   HGBA1C 7.1 (H) 01/09/2023     Assessment/Plan 1. Dysuria - ongoing, continues to have dysuria and frequency - recent urine culture + E.coli 10,000-49,000 cfu/mL - will start abx due to symptoms - encourage hydration with water - cephALEXin (KEFLEX) 500 MG capsule; Take 1 capsule (500 mg total) by mouth 4 (four) times daily for 5 days.  Dispense: 20 capsule; Refill: 0  2. Bacterial vaginosis - recent vaginal swab + BV - Flagyl PO started 11/21 per Dr. Jacquenette Shone  3. Nausea - improved to eating - associated with Ozempic use> lack of appetite - cont zofran PRN  - discussed eating at least 2 meals daily  4. Cerebrovascular accident (CVA), unspecified mechanism (HCC) - hospitalized  09/24-09/28 - CT head no acute intracranial hemorrhage or acute large vessel infarct - cont Eliquis and statin - atorvastatin (LIPITOR) 40 MG tablet; Take 1 tablet (40 mg total) by mouth daily.  Dispense: 90 tablet; Refill: 1  5. Hyperlipidemia associated with type 2 diabetes mellitus (HCC) - total 159, LDL 104 12/2022 - needs fasting lipid panel - atorvastatin (LIPITOR) 40 MG tablet; Take 1 tablet (40 mg total) by mouth daily.  Dispense: 90 tablet; Refill: 1  6. Periumbilical abdominal pain - exam unremarkable today - lipase was normal - she has not scheduled CT abdomen yet   Total time: 31 minutes. Greater than 50% of total time spent doing patient education regarding dysuria, abdominal pain, nausea and BV including symptom/medication management.    Next appt: 03/08/2023  Hazle Nordmann, Juel Burrow  [facility] [phone]

## 2023-03-08 ENCOUNTER — Other Ambulatory Visit: Payer: Self-pay | Admitting: Orthopedic Surgery

## 2023-03-08 DIAGNOSIS — E1165 Type 2 diabetes mellitus with hyperglycemia: Secondary | ICD-10-CM

## 2023-03-08 NOTE — Telephone Encounter (Signed)
error 

## 2023-03-09 LAB — SURESWAB® ADVANCED BACTERIAL VAGINOSIS (BV), TMA: SURESWAB(R) ADV BACTERIAL VAGINOSIS(BV),TMA: POSITIVE — AB

## 2023-03-09 LAB — CHLAMYDIA/NEISSERIA GONORRHOEAE RNA,TMA,UROGENTIAL
C. trachomatis RNA, TMA: NOT DETECTED
N. gonorrhoeae RNA, TMA: NOT DETECTED

## 2023-03-11 ENCOUNTER — Ambulatory Visit: Payer: Medicaid Other | Admitting: Orthopedic Surgery

## 2023-03-12 ENCOUNTER — Other Ambulatory Visit: Payer: Self-pay

## 2023-03-12 NOTE — Patient Outreach (Signed)
Medicaid Managed Care Social Work Note  03/12/2023 Name:  Courtney Davies DOBLE MRN:  [id number] DOB:  May 05, 1975  Courtney Davies is an 47 y.o. year old female who is a primary patient of [name], [name], NP.  The Medicaid Managed Care Coordination team was consulted for assistance with:  Community Resources   [name] was given information about Medicaid Managed Care Coordination team services today. Courtney Davies Patient agreed to services and verbal consent obtained.  Engaged with patient  for by telephone forfollow up visit in response to referral for case management and/or care coordination services.   Assessments/Interventions:  Review of past medical history, allergies, medications, health status, including review of consultants reports, laboratory and other test data, was performed as part of comprehensive evaluation and provision of chronic care management services.  SDOH: (Social Determinant of Health) assessments and interventions performed: SDOH Interventions    Flowsheet Row Telephone from 01/25/2023 in [place] POPULATION HEALTH DEPARTMENT Telephone from 01/14/2023 in [facility] Community Care Coordination ED to Hosp-Admission (Discharged) from 01/08/2023 in [place] [facility] Care Office Visit from 03/01/2022 in [facility]  SDOH Interventions      Food Insecurity Interventions Other (Comment)  [name] refer patient to Courtney Davies for follow up] -- [id number] Referral, Inpatient TOC --  Transportation Interventions Other (Comment)  [referral to [employer] placed for Courtney Davies needs] Intervention Not Indicated  [pt states her mom will take her to appts or she has Medicaid transportation-she was given info in hospital and has number to call] Inpatient TOC --  Depression Interventions/Treatment  -- -- -- Medication, Currently on Treatment     BSW completed a telephone outreach with patient, she states she did receive the  resources BSW sent her. No additional resources are needed at this time.   Advanced Directives Status:  Not addressed in this encounter.  Care Plan                 Allergies  Allergen Reactions   Latex Hives   Other Other (See Comments), Anaphylaxis, Swelling and Hives    Hair glue causes throat to close and hives "glue" Hair glue causes throat to close    Codeine Nausea And Vomiting    Was on an empty stomach.   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   Cough 04/26/2021   Acute blood loss anemia    Controlled type 2 diabetes mellitus with hyperglycemia (HCC)    Essential hypertension    Anoxic brain injury (HCC) 01/02/2021   Fever 12/20/2020   Abdominal wall cellulitis 12/20/2020   Dysphagia    Goals of care, counseling/discussion    Acute systolic heart failure (HCC)    Encephalopathy acute    Type 1 diabetes mellitus without complication (HCC) 12/02/2020   Hypertension 12/02/2020   GERD (gastroesophageal reflux disease) 12/02/2020   Anemia 12/02/2020   Cardiac arrest (HCC) 12/01/2020   Unilateral primary osteoarthritis, left knee 12/01/2018   Lateral meniscus, posterior horn derangement, left 05/14/2018   Status post arthroscopy of left knee 05/14/2018   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/19/2017   Anemia 04/23/2017  Acute bronchitis 04/23/2017   Chronic back pain 10/03/2015   Symptomatic anemia 04/10/2014   Bilateral edema of lower extremity    Low back pain with left-sided sciatica     Conditions to be addressed/monitored per PCP order:   community resources  There are no care plans that you recently modified to  display for this patient.   Follow up:  Patient agrees to Care Plan and Follow-up.  Plan: The  Patient has been provided with contact information for the Managed Medicaid care management team and has been advised to call with any health related questions or concerns.    Courtney Davies, MHA [facility]  Managed [facility] Social Worker [phone]

## 2023-03-12 NOTE — Patient Instructions (Signed)
Visit Information  Courtney Davies was given information about Medicaid Managed Care team care coordination services as a part of their [facility] Community Plan Medicaid benefit. Courtney Davies verbally consented to engagement with the [facility] Managed Care team.   If you are experiencing a medical emergency, please call 911 or report to your local emergency department or urgent care.   If you have a non-emergency medical problem during routine business hours, please contact your provider's office and ask to speak with a nurse.   For questions related to your [facility], please call: [phone] or visit the homepage here: [email or web address]  If you would like to schedule transportation through your [facility], please call the following number at least 2 days in advance of your appointment: [phone]   Rides for urgent appointments can also be made after hours by calling Member Services.  Call the Behavioral Health Crisis Line at [phone], at any time, 24 hours a day, 7 days a week. If you are in danger or need immediate medical attention call 911.  If you would like help to quit smoking, call 1-800-QUIT-NOW ((806)223-1425) OR Espaol: 1-855-Djelo-Ya (3-016-010-9323) o para ms informacin haga clic aqu or Text READY to 557-322 to register via text  Courtney Davies - following are the goals we discussed in your visit today:   Goals Addressed   None      The  Patient                                              has been provided with contact information for the Managed Medicaid care management team and has been advised to call with any health related questions or concerns.   Courtney Davies, Courtney Davies, MHA [facility]  Managed Medicaid Social Worker [phone]   Following is a copy of your plan of care:  There are no care plans that you recently modified to display for this  patient.

## 2023-03-20 ENCOUNTER — Ambulatory Visit: Payer: Medicaid Other | Attending: Neurology | Admitting: Physical Therapy

## 2023-03-20 ENCOUNTER — Other Ambulatory Visit: Payer: Self-pay | Admitting: Orthopedic Surgery

## 2023-03-20 ENCOUNTER — Encounter: Payer: Self-pay | Admitting: Physical Therapy

## 2023-03-20 DIAGNOSIS — R531 Weakness: Secondary | ICD-10-CM

## 2023-03-20 DIAGNOSIS — R2681 Unsteadiness on feet: Secondary | ICD-10-CM

## 2023-03-20 DIAGNOSIS — R262 Difficulty in walking, not elsewhere classified: Secondary | ICD-10-CM | POA: Diagnosis present

## 2023-03-20 DIAGNOSIS — I639 Cerebral infarction, unspecified: Secondary | ICD-10-CM

## 2023-03-20 DIAGNOSIS — M6281 Muscle weakness (generalized): Secondary | ICD-10-CM | POA: Diagnosis present

## 2023-03-20 DIAGNOSIS — M25562 Pain in left knee: Secondary | ICD-10-CM

## 2023-03-20 NOTE — Therapy (Addendum)
OUTPATIENT PHYSICAL THERAPY TREATMENT/Discharge Summary   Patient Name: Courtney Davies MRN: [id number] DOB:28-Dec-1975, 47 y.o., female Today's Date: 03/20/2023   END OF SESSION:  PT End of Session - 03/20/23 1735     Visit Number 2    Date for PT Re-Evaluation 05/13/23    Authorization Type U[employer] Medicaid    Progress Note Due on Visit 10    PT Start Time 1535    PT Stop Time 1615    PT Time Calculation (min) 40 min    Activity Tolerance Patient tolerated treatment well    Behavior During Therapy [facility] for tasks assessed/performed              Past Medical History:  Diagnosis Date   Anemia 09/2015   Anoxic brain injury (HCC)    Arthritis    "knees" (04/23/2017)   Asthma    "teens; went away; came back" (04/23/2017)   B12 deficiency anemia 04/23/2017   Cardiac arrest (HCC)    Chronic bronchitis (HCC)    Chronic low back pain with sciatica    Chronic lower back pain    Diabetes mellitus without complication (HCC)    Diabetes type 2, controlled (HCC)    Elevated ferritin level    Fatty liver    GERD (gastroesophageal reflux disease)    GERD with stricture    Headache    "1-2/wk" (04/23/2017)   History of blood transfusion "plenty"   "related to anemia" (04/23/2017)   Hypertension    Inguinal hernia    Low back pain    Migraine    "1-2/month" (04/23/2017)   OA (osteoarthritis) of knee--left    Paroxysmal atrial fibrillation (HCC)    Sub-clinical -seen on device?, elevated CHADSVASC   Symptomatic anemia 04/23/2017   Vitamin B 12 deficiency    Past Surgical History:  Procedure Laterality Date   ABDOMINAL HYSTERECTOMY  YRS AGO   COMPLETE   CESAREAN SECTION  1998; 2002   ESOPHAGEAL DILATION  02/2020   by Dr Lanae Boast   INGUINAL HERNIA REPAIR Bilateral 1980s    "total of 4 surgeries" (04/23/2017)   IR GASTROSTOMY TUBE MOD SED  12/13/2020   KNEE ARTHROSCOPY WITH MEDIAL MENISECTOMY Left 01/30/2018   Procedure: LEFT KNEE ARTHROSCOPY WITH PARTIAL LATERAL MENISCECTOMY;   Surgeon: Kathryne Hitch, MD;  Location: [facility];  Service: Orthopedics;  Laterality: Left;   KNEE CARTILAGE SURGERY Left    SUBQ ICD IMPLANT N/A 09/13/2021   Procedure: SUBQ ICD IMPLANT;  Surgeon: [name], MD;  Location: [facility] INVASIVE CV LAB;  Service: Cardiovascular;  Laterality: N/A;   TUBAL LIGATION  2002   Patient Active Problem List   Diagnosis Date Noted   Stroke (HCC) 01/08/2023   Mild intermittent asthma without complication 02/09/2022   Vitamin B12 deficiency 02/09/2022   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   Cough 04/26/2021   Acute blood loss anemia    Controlled type 2 diabetes mellitus with hyperglycemia (HCC)    Essential hypertension    Anoxic brain injury (HCC) 01/02/2021   Fever 12/20/2020   Abdominal wall cellulitis 12/20/2020   Dysphagia    Goals of care, counseling/discussion    Acute systolic heart failure (HCC)    Encephalopathy acute    Type 1 diabetes mellitus without complication (HCC) 12/02/2020   Hypertension 12/02/2020   GERD (gastroesophageal reflux disease) 12/02/2020   Anemia 12/02/2020   Cardiac arrest (HCC) 12/01/2020   Unilateral primary osteoarthritis, left knee 12/01/2018  Lateral meniscus, posterior horn derangement, left 05/14/2018   Status post arthroscopy of left knee 05/14/2018   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/19/2017   Anemia 04/23/2017   Acute bronchitis 04/23/2017   Chronic back pain 10/03/2015   Symptomatic anemia 04/10/2014   Bilateral edema of lower extremity    Low back pain with left-sided sciatica     PCP: [name], NP   REFERRING PROVIDER: Elmer Picker, NP   REFERRING DIAG:  G45.9 (ICD-10-CM) - TIA (transient ischemic attack) G93.1 (ICD-10-CM) - Anoxic brain injury (HCC)    THERAPY DIAG:  Difficulty in walking, not elsewhere classified  Unsteadiness on feet  Muscle weakness (generalized)  Acute pain of left  knee  RATIONALE FOR EVALUATION AND TREATMENT: Rehabilitation  ONSET DATE: 01/08/23 - TIA; 2022 - Anoxic brain injury  NEXT MD VISIT:    SUBJECTIVE:                                                                                                                                                                                                         SUBJECTIVE STATEMENT: My knee still hurts, I need a total knee replacement.  I did PT before it, nothing helps.  I don't know why I'm here, I'm back to work.  My left hand is still bad, haven't heard anything about OT.    Pt accompanied by: self  PAIN: Are you having pain? Yes: NPRS scale: 50/10 Pain location: L knee  Pain description: throbbing Aggravating factors: standing up, sitting down too long, everything, just waking up, doing too much  Relieving factors: nothing, cut it off  PERTINENT HISTORY:   Anoxic brain injury after V-fib cardiac arrest in 2022 with ICD placement; HTN; DM-II; OA - knees; LBP; GERD; paroxysmal A-fib; migraines; asthma; anxiety  PRECAUTIONS: Fall and ICD/Pacemaker  RED FLAGS: None  WEIGHT BEARING RESTRICTIONS: No  FALLS:  Has patient fallen in last 6 months? No  LIVING ENVIRONMENT: Lives with: lives with their family Lives in: House/apartment Stairs: Yes: External: 3-4 steps; on right going up and on left going up Has following equipment at home: None said she requested a shower chair but has not received.   OCCUPATION: Works at [employer]  PLOF: Independent  PATIENT GOALS: being able to walk without losing my balance, walk normal, get this pain out of me.    Also work on L hand, keep dropping things.    OBJECTIVE: (objective measures completed at initial evaluation unless otherwise dated)  DIAGNOSTIC FINDINGS:  01/10/23 - MR Brain w/o contrast: IMPRESSION: No  evidence of acute intracranial abnormality.  01/09/23 - CT head w/o contrast: IMPRESSION: 1. No evidence of an acute intracranial  abnormality. 2. Minor paranasal sinus disease.  01/08/23 - CT Angio head & neck: IMPRESSION: Negative CTA. No occlusion, stenosis, or irregularity of major arteries in the head and neck.  01/08/23 - CT head (Code Stroke): IMPRESSION: 1. No acute intracranial hemorrhage or evidence of acute large vessel territory infarct. ASPECT score is 10. 2. Possible hyperdense right MCA, though this may be artifactual due to asymmetric streak artifact along the middle cranial fossae. CTA head and neck is recommended for further evaluation.  COGNITION: Overall cognitive status: Impaired   SENSATION: Decreased sensation LUE and LLE, especially below left knee reports numbness  COORDINATION: Impaired coordination UE and LE, unable to do heel to shin on L side, touches eye when does finger to nose with eyes closed, decreased speed and fluidity on L with finger to thumb touches.   POSTURE:  rounded shoulders  LOWER EXTREMITY ROM:     Active  Right eval Left eval  Hip flexion    Hip extension    Hip abduction    Hip adduction    Hip internal rotation    Hip external rotation    Knee flexion    Knee extension    Ankle dorsiflexion    Ankle plantarflexion    Ankle inversion    Ankle eversion     (Blank rows = not tested)  LOWER EXTREMITY MMT:    MMT Right eval Left eval  Hip flexion 4 3+  Hip extension    Hip abduction 4 (Seated) 4 (seated)  Hip adduction 4(seated)  4 (seated)   Knee flexion 4+ 3+  Knee extension 5 3+  Ankle dorsiflexion 4+ 3  Ankle plantarflexion 4 4  (Blank rows = not tested)   GAIT: Distance walked: 50' Assistive device utilized: None Level of assistance: Complete Independence Gait pattern: step to pattern, decreased arm swing- Left, decreased ankle dorsiflexion- Left, shuffling, decreased trunk rotation, and poor foot clearance- Left Comments: decreased gait speed 0.71m/s  FUNCTIONAL TESTS:  30 seconds chair stand test competed 2, hands on thighs, excessive  rocking needed.  Timed up and go (TUG): 36.6 sec  PATIENT SURVEYS:  Stroke Impact Scale 38/80   TODAY'S TREATMENT:  03/20/23 Therapeutic Exercise: to improve strength and mobility.  Demo, verbal and tactile cues throughout for technique. Nustep L5 x 6 min  5x STS - 20 seconds Isometric wall minisquats 5 x 5 sec hold Toe raises at wall x 10 Heel raises at wall for support x 10 Wall push-ups x 10  Seated LAQ with isometric hold 5 x 5 sec hold - painful Seated quad set 10 x 5 sec hold - tactile cues needed Supine straight leg raise x 10 r/l   PATIENT EDUCATION:  Education details: initial HEP  Person educated: Patient Education method: Explanation, Demonstration, Tactile cues, Verbal cues, and Handouts Education comprehension: verbalized understanding, returned demonstration, and needs further education  HOME EXERCISE PROGRAM: Access Code: [email or web address] URL: [email or web address] Date: 03/20/2023 Prepared by: Harrie Foreman  Exercises - Wall Push Up  - 1 x daily - 7 x weekly - 1-3 sets - 10 reps - Seated Quad Set  - 1 x daily - 7 x weekly - 2 sets - 10 reps - Supine Active Straight Leg Raise  - 1 x daily - 7 x weekly - 2 sets - 10 reps   ASSESSMENT:  CLINICAL IMPRESSION: [name] arrived  today stating that she did not know why she was in PT, as she had had PT for her knee before and nothing was going to help it but surgery, but also stating she does not want surgery and wants to do everything she can to prevent surgery. Overall she reports improvements compared to IE and has returned to work, her primary concern at this time is her L knee pain.   She reported pain with exercises and had very low tolerance to initial exercises, exercises she was able to tolerate were added to HEP.  Also sent note to referring provider about OT.     OBJECTIVE IMPAIRMENTS: Abnormal gait, decreased activity tolerance, decreased balance, decreased coordination, decreased  endurance, decreased knowledge of condition, decreased mobility, difficulty walking, decreased strength, decreased safety awareness, impaired perceived functional ability, impaired sensation, impaired UE functional use, and pain.   ACTIVITY LIMITATIONS: carrying, lifting, bending, sitting, standing, squatting, stairs, transfers, and locomotion level  PARTICIPATION LIMITATIONS: meal prep, cleaning, laundry, driving, shopping, community activity, and occupation  PERSONAL FACTORS: Age, Past/current experiences, Time since onset of injury/illness/exacerbation, and 3+ comorbidities:  Anoxic brain injury after V-fib cardiac arrest in 2022 with ICD placement; HTN; DM-II; OA - knees; LBP; GERD; paroxysmal A-fib; migraines; asthma; anxiety  are also affecting patient's functional outcome.   REHAB POTENTIAL: Good  CLINICAL DECISION MAKING: Evolving/moderate complexity  EVALUATION COMPLEXITY: Moderate   GOALS: Goals reviewed with patient? Yes  SHORT TERM GOALS: Target date: 04/01/2023  Patient will be independent with initial HEP to improve outcomes and carryover.  Baseline:  Goal status: IN PROGRESS  2.  Patient will be educated on strategies to decrease risk of falls.  Baseline:  Goal status: IN PROGRESS  3.  Patient will demonstrate decreased TUG time to </= 25 sec to decrease risk for falls with transitional mobility. Baseline: 36.6 sec Goal status: IN PROGRESS  LONG TERM GOALS: Target date: 05/13/2023  Patient will be independent with advanced/ongoing HEP to facilitate ability to maintain/progress functional gains from skilled physical therapy services. Baseline:  Goal status: IN PROGRESS  2.  Patient will be able to ambulate 600' with or w/o LRAD on variable surfaces with good safety to access community.  Baseline:  significant gait deviations and increased time needed for 75' Goal status: IN PROGRESS  3.  Patient will be able to step up/down curb safely with LRAD for safety with  community ambulation.  Baseline:  Goal status: IN PROGRESS   4.  Patient will demonstrate improved functional LE strength as demonstrated by 4+/5 LLE strength. Baseline: see objective Goal status: IN PROGRESS  5.  Patient will improve 30 sec chair rise time to 8 for improved efficiency and safety with transfers Baseline: 2 Goal status: IN PROGRESS 03/20/23 5x STS in 20 seconds today.    6.  Patient will demonstrate gait speed of > 0.8 m/s to be a safe community ambulator with decreased risk for recurrent falls.  Baseline: 0.48 m/s Goal status: IN PROGRESS  7.  Patient will report 50% improvement in LLE pain to improve QOL.  Baseline: 8/10 L knee Goal status: IN PROGRESS  8.  Patient will demonstrate at least 19/24 on DGI to improve gait stability and reduce risk for falls. Baseline: NT Goal status: IN PROGRESS  9. Patient will report 8 points improvement on SIS-16 to demonstrate improved functional ability. Baseline: 20/80 Goal status: IN PROGRESS   PLAN:  PT FREQUENCY: 1-2x/week  PT DURATION: 10 weeks  PLANNED INTERVENTIONS: 97110-Therapeutic exercises, 97530- Therapeutic  activity, [id number]- Neuromuscular re-education, [phone]- Self Care, 52841- Manual therapy, [id number]- Gait training, [phone]- Electrical stimulation (unattended), [id number]- Electrical stimulation (manual), [id number]- Ultrasound, 10272- Ionotophoresis 4mg /ml Dexamethasone, Patient/Family education, Balance training, Stair training, Taping, Dry Needling, Joint mobilization, Joint manipulation, Spinal manipulation, Spinal mobilization, Cryotherapy, and Moist heat  PLAN FOR NEXT SESSION: assess L knee, start HEP for LE strength and balance.    Jena Gauss, PT 03/20/2023, 5:44 PM  PHYSICAL THERAPY DISCHARGE SUMMARY  Visits from Start of Care: 2 including initial evaluation  Current functional level related to goals / functional outcomes: Improved functional mobility   Remaining deficits: LE pain, gait deficits    Education / Equipment: HEP  Plan: Patient is being discharged per no show policy after no shows on 12/10, 12/11 and 04/01/23.  We had talked to her on 04/01/23 and she confirmed that she was coming and she had been informed of our no show policy and that she would be discharged if she no showed again.  She has been referred for OT as primary concern was L hand dexterity.       Jena Gauss, PT  04/12/2023 8:28 AM

## 2023-03-20 NOTE — Addendum Note (Signed)
Addended by: Maurice Small on: 03/20/2023 04:35 PM   Modules accepted: Orders

## 2023-03-21 ENCOUNTER — Encounter: Payer: Self-pay | Admitting: Orthopedic Surgery

## 2023-03-21 ENCOUNTER — Other Ambulatory Visit: Payer: Self-pay | Admitting: *Deleted

## 2023-03-21 ENCOUNTER — Ambulatory Visit (INDEPENDENT_AMBULATORY_CARE_PROVIDER_SITE_OTHER): Payer: Self-pay | Admitting: Orthopedic Surgery

## 2023-03-21 VITALS — BP 134/80 | HR 61 | Temp 97.2°F | Resp 17 | Ht 66.0 in | Wt 222.8 lb

## 2023-03-21 DIAGNOSIS — B9689 Other specified bacterial agents as the cause of diseases classified elsewhere: Secondary | ICD-10-CM

## 2023-03-21 DIAGNOSIS — J4520 Mild intermittent asthma, uncomplicated: Secondary | ICD-10-CM

## 2023-03-21 DIAGNOSIS — I10 Essential (primary) hypertension: Secondary | ICD-10-CM

## 2023-03-21 DIAGNOSIS — N760 Acute vaginitis: Secondary | ICD-10-CM

## 2023-03-21 DIAGNOSIS — R200 Anesthesia of skin: Secondary | ICD-10-CM

## 2023-03-21 DIAGNOSIS — E1165 Type 2 diabetes mellitus with hyperglycemia: Secondary | ICD-10-CM

## 2023-03-21 MED ORDER — METRONIDAZOLE 0.75 % VA GEL: 1.0000 | Freq: Every day | VAGINAL | 0 refills | Status: DC

## 2023-03-21 MED ORDER — GABAPENTIN 100 MG PO CAPS: 200.0000 mg | ORAL_CAPSULE | Freq: Every day | ORAL | 3 refills | Status: DC

## 2023-03-21 NOTE — Patient Outreach (Signed)
Medicaid Managed Care   Nurse Care Manager Note  03/21/2023 Name:  Courtney Davies MRN:  [id number] DOB:  June 02, 1975  Courtney Davies is an 47 y.o. year old female who is a primary patient of [name], [name], NP.  The [facility] Managed Care Coordination team was consulted for assistance with:    Memory deficits   Courtney Davies was given information about Medicaid Managed Care Coordination team services today. Courtney Davies Patient agreed to services and verbal consent obtained.  Engaged with patient by telephone for follow up visit in response to provider referral for case management and/or care coordination services.   Assessments/Interventions:  Review of past medical history, allergies, medications, health status, including review of consultants reports, laboratory and other test data, was performed as part of comprehensive evaluation and provision of chronic care management services.  SDOH (Social Determinants of Health) assessments and interventions performed: SDOH Interventions    Flowsheet Row Telephone from 01/25/2023 in [place] POPULATION HEALTH DEPARTMENT Telephone from 01/14/2023 in [facility] Community Care Coordination ED to Hosp-Admission (Discharged) from 01/08/2023 in [place] [facility] Care Office Visit from 03/01/2022 in [facility]  SDOH Interventions      Food Insecurity Interventions Other (Comment)  Courtney Davies refer patient to Courtney Davies for follow up] -- [id number] Referral, Inpatient TOC --  Transportation Interventions Other (Comment)  [referral to [employer] placed for [name] needs] Intervention Not Indicated  [pt states her mom will take her to appts or she has Medicaid transportation-she was given info in hospital and has number to call] Inpatient TOC --  Depression Interventions/Treatment  -- -- -- Medication, Currently on Treatment       Care Plan  Allergies  Allergen Reactions   Latex Hives   Other  Other (See Comments), Anaphylaxis, Swelling and Hives    Hair glue causes throat to close and hives "glue" Hair glue causes throat to close    Codeine Nausea And Vomiting    Was on an empty stomach.   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   Cough 04/26/2021   Acute blood loss anemia    Controlled type 2 diabetes mellitus with hyperglycemia (HCC)    Essential hypertension    Anoxic brain injury (HCC) 01/02/2021   Fever 12/20/2020   Abdominal wall cellulitis 12/20/2020   Dysphagia    Goals of care, counseling/discussion    Acute systolic heart failure (HCC)    Encephalopathy acute    Type 1 diabetes mellitus without complication (HCC) 12/02/2020   Hypertension 12/02/2020   GERD (gastroesophageal reflux disease) 12/02/2020   Anemia 12/02/2020   Cardiac arrest (HCC) 12/01/2020   Unilateral primary osteoarthritis, left knee 12/01/2018   Lateral meniscus, posterior horn derangement, left 05/14/2018   Status post arthroscopy of left knee 05/14/2018   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/19/2017   Anemia 04/23/2017   Acute bronchitis 04/23/2017   Chronic back pain 10/03/2015   Symptomatic anemia 04/10/2014   Bilateral edema of lower extremity    Low back pain with left-sided sciatica     Conditions to be addressed/monitored per PCP order:   Memory  deficits  Care Plan : RN Care Manager Plan of Care  Updates made by Courtney Dach, RN since 03/21/2023 12:00 AM     Problem: Health Management needs related to memory deficits after Anoxic Brain Injury      Long-Range Goal: Development of Plan of Care to address Health Management needs  related to memory deficits after Anoxic Brain Injury   Start Date: 02/19/2023  Expected End Date: 05/20/2023  Note:   Current Barriers:  Chronic Disease Management support and education needs related to Anoxic Brain Injury [name] is attending PT and is keeping up with her upcoming appointments via portals. She is working at [employer]. She has questions regarding her insurance coverage. She is having difficulty managing the stress as she manages her health.   RNCM Clinical Goal(s):  Patient will take all medications exactly as prescribed and will call provider for medication related questions as evidenced by patient reports attend all scheduled medical appointments: , scheduled PT visits, 03/25/23 with Diabetes Education, 03/28/23 for infusion, 04/25/23 for infusion and 05/08/23 with Endocrinology as evidenced by provider documentation continue to work with RN Care Manager to address care management and care coordination needs related to  memory deficits related to Anoxic Brain injury as evidenced by adherence to CM Team Scheduled appointments through collaboration with RN Care manager, provider, and care team.   Interventions: Evaluation of current treatment plan related to  self management and patient's adherence to plan as established by provider   Memory Deficits post Anoxic Brain Injury  (Status:  Goal on track:  Yes.)  Long Term Goal Evaluation of current treatment plan related to  memory deficits post Anoxic Brain Injury ,  self-management and patient's adherence to plan as established by provider. Discussed plans with patient for ongoing care management follow up and provided patient with direct contact information for care management team Advised patient to take all medications to upcoming appointments Provided education to patient re: heart health Reviewed scheduled/upcoming provider appointments including scheduled PT visits, 03/25/23 for Diabetes Education, 12/12 for infusion, 1/9 for infusion  and 05/08/23 with [facility] Endocrinology Provided patient with [facility] member services [phone] -patient physically wrote down number, advised to contact regarding insurance coverage LCSW referral for managing the stress of chronic health issues  Patient Goals/Self-Care Activities: Take all medications as prescribed Attend all scheduled provider appointments Call provider office for new concerns or questions  Keep a calendar of all upcoming appointments  Follow Up Plan:  Telephone follow up appointment with care management team member scheduled for:  04/25/22 at 3:15pm      Follow Up:  Patient agrees to Care Plan and Follow-up.  Plan: The Managed Medicaid care management team will reach out to the patient again over the next 30 days.  Date/time of next scheduled RN care management/care coordination outreach:  04/26/23 at 3:15pm  Estanislado Emms RN, BSN [place]  [facility] RN Care Coordinator [phone]

## 2023-03-21 NOTE — Patient Instructions (Signed)
Visit Information  Ms. Hingle was given information about Medicaid Managed Care team care coordination services as a part of their [facility] Community Plan Medicaid benefit. [name] verbally consented to engagement with the [facility] Managed Care team.   If you are experiencing a medical emergency, please call 911 or report to your local emergency department or urgent care.   If you have a non-emergency medical problem during routine business hours, please contact your provider's office and ask to speak with a nurse.   For questions related to your [facility], please call: [phone] or visit the homepage here: [email or web address]  If you would like to schedule transportation through your [facility], please call the following number at least 2 days in advance of your appointment: [phone]   Rides for urgent appointments can also be made after hours by calling Member Services.  Call the Behavioral Health Crisis Line at [phone], at any time, 24 hours a day, 7 days a week. If you are in danger or need immediate medical attention call 911.  If you would like help to quit smoking, call 1-800-QUIT-NOW (442-426-5836) OR Espaol: 1-855-Djelo-Ya (4-034-742-5956) o para ms informacin haga clic aqu or Text READY to 387-564 to register via text  Ms. Earlene Plater,   Please see education materials related to DM provided by MyChart link.  Patient verbalizes understanding of instructions and care plan provided today and agrees to view in MyChart. Active MyChart status and patient understanding of how to access instructions and care plan via MyChart confirmed with patient.     Telephone follow up appointment with Managed Medicaid care management team member scheduled for:04/26/23 at 3:15pm  Estanislado Emms RN, BSN [place]  [facility] RN Care  Coordinator [phone]   Following is a copy of your plan of care:  Care Plan : RN Care Manager Plan of Care  Updates made by Heidi Dach, RN since 03/21/2023 12:00 AM     Problem: Health Management needs related to memory deficits after Anoxic Brain Injury      Long-Range Goal: Development of Plan of Care to address Health Management needs related to memory deficits after Anoxic Brain Injury   Start Date: 02/19/2023  Expected End Date: 05/20/2023  Note:   Current Barriers:  Chronic Disease Management support and education needs related to Anoxic Brain Injury Ms. Livermore is attending PT and is keeping up with her upcoming appointments via portals. She is working at [employer]. She has questions regarding her insurance coverage. She is having difficulty managing the stress as she manages her health.   RNCM Clinical Goal(s):  Patient will take all medications exactly as prescribed and will call provider for medication related questions as evidenced by patient reports attend all scheduled medical appointments: , scheduled PT visits, 03/25/23 with Diabetes Education, 03/28/23 for infusion, 04/25/23 for infusion and 05/08/23 with Endocrinology as evidenced by provider documentation continue to work with RN Care Manager to address care management and care coordination needs related to  memory deficits related to Anoxic Brain injury as evidenced by adherence to CM Team Scheduled appointments through collaboration with RN Care manager, provider, and care team.   Interventions: Evaluation of current treatment plan related to  self management and patient's adherence to plan as established by provider   Memory Deficits post Anoxic Brain Injury  (Status:  Goal on track:  Yes.)  Long Term Goal Evaluation of current treatment plan related to  memory  deficits post Anoxic Brain Injury ,  self-management and patient's adherence to plan as established by provider. Discussed plans with patient for ongoing care  management follow up and provided patient with direct contact information for care management team Advised patient to take all medications to upcoming appointments Provided education to patient re: heart health Reviewed scheduled/upcoming provider appointments including scheduled PT visits, 03/25/23 for Diabetes Education, 12/12 for infusion, 1/9 for infusion and 05/08/23 with [facility] Provided patient with [facility] member services [phone] -patient physically wrote down number, advised to contact regarding insurance coverage LCSW referral for managing the stress of chronic health issues  Patient Goals/Self-Care Activities: Take all medications as prescribed Attend all scheduled provider appointments Call provider office for new concerns or questions  Keep a calendar of all upcoming appointments  Follow Up Plan:  Telephone follow up appointment with care management team member scheduled for:  04/25/22 at 3:15pm

## 2023-03-21 NOTE — Progress Notes (Signed)
Careteam: Patient Care Team: [name], NP as PCP - General (Adult Health Nurse Practitioner) [name], MD as PCP - Cardiology (Cardiology) [name], MD as PCP - Electrophysiology (Cardiology) [place], [place] (Family Medicine) Marcos Eke, RN as Registered Nurse Shaune Leeks as VBCI Care Management Heidi Dach, RN as VBCI Care Management  Seen by: Hazle Nordmann, AGNP-C  PLACE OF SERVICE:  [facility]  Advanced Directive information Does Patient Have a Medical Advance Directive?: No, Would patient like information on creating a medical advance directive?: No - Patient declined  Allergies  Allergen Reactions   Latex Hives   Other Other (See Comments), Anaphylaxis, Swelling and Hives    Hair glue causes throat to close and hives "glue" Hair glue causes throat to close    Codeine Nausea And Vomiting    Was on an empty stomach.   Nitrofurantoin Rash    No chief complaint on file.    HPI: Patient is a 47 y.o. female seen today for medical management of chronic conditions.   Recent vaginal swab + for bacterial vaginosis. She was prescribed Flagyl gel and symptoms subsided. Dysuria and vaginal discomfort returned a few days ago. Requesting to try another round of Flagyl gel. She is followed by gynecology. We discussed seeing gynecology if second dose of Flagyl does not help. Also recommend womens probiotic.   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 She is followed by PT. OT evaluation recommended last session. She described left arm and leg as "intermittent burning." She was on gabapentin earlier this year, but was discontinued last hospitalization. Plan to try reduced dose today. Remains on Eliquis and atorvastatin for CVA prevention.   Followed by [facility] for T2DM. Scheduled to have Dexcom education this afternoon. No hypoglycemias. Foot exam done today. A1c 7.1 01/09/2023. Remains on  Ozempic, metformin and Jardiance.    Review of Systems:  Review of Systems  Constitutional: Negative.   HENT: Negative.    Eyes: Negative.   Respiratory: Negative.    Cardiovascular: Negative.   Gastrointestinal: Negative.   Genitourinary: Negative.   Musculoskeletal:  Positive for myalgias. Negative for falls and joint pain.  Skin: Negative.   Neurological:  Positive for sensory change and weakness. Negative for dizziness and headaches.  Psychiatric/Behavioral:  Positive for depression and memory loss. Negative for suicidal ideas. The patient has insomnia. The patient is not nervous/anxious.     Past Medical History:  Diagnosis Date   Anemia 09/2015   Anoxic brain injury (HCC)    Arthritis    "knees" (04/23/2017)   Asthma    "teens; went away; came back" (04/23/2017)   B12 deficiency anemia 04/23/2017   Cardiac arrest (HCC)    Chronic bronchitis (HCC)    Chronic low back pain with sciatica    Chronic lower back pain    Diabetes mellitus without complication (HCC)    Diabetes type 2, controlled (HCC)    Elevated ferritin level    Fatty liver    GERD (gastroesophageal reflux disease)    GERD with stricture    Headache    "1-2/wk" (04/23/2017)   History of blood transfusion "plenty"   "related to anemia" (04/23/2017)   Hypertension    Inguinal hernia    Low back pain    Migraine    "1-2/month" (04/23/2017)   OA (osteoarthritis) of knee--left    Paroxysmal atrial fibrillation (HCC)    Sub-clinical -seen on device?, elevated  CHADSVASC   Symptomatic anemia 04/23/2017   Vitamin B 12 deficiency    Past Surgical History:  Procedure Laterality Date   ABDOMINAL HYSTERECTOMY  YRS AGO   COMPLETE   CESAREAN SECTION  1998; 2002   ESOPHAGEAL DILATION  02/2020   by Dr Lillia Dallas HERNIA REPAIR Bilateral 1980s    "total of 4 surgeries" (04/23/2017)   IR GASTROSTOMY TUBE MOD SED  12/13/2020   KNEE ARTHROSCOPY WITH MEDIAL MENISECTOMY Left 01/30/2018   Procedure: LEFT KNEE  ARTHROSCOPY WITH PARTIAL LATERAL MENISCECTOMY;  Surgeon: Kathryne Hitch, MD;  Location: [facility] ORS;  Service: Orthopedics;  Laterality: Left;   KNEE CARTILAGE SURGERY Left    SUBQ ICD IMPLANT N/A 09/13/2021   Procedure: SUBQ ICD IMPLANT;  Surgeon: [name], MD;  Location: [facility] INVASIVE CV LAB;  Service: Cardiovascular;  Laterality: N/A;   TUBAL LIGATION  2002   Social History:   reports that she has quit smoking. Her smoking use included cigarettes. She has never used smokeless tobacco. She reports current alcohol use. She reports current drug use. Drug: Marijuana.  Family History  Problem Relation Age of Onset   Stroke Mother    Diabetes Mother    Diabetes Father    Stroke Maternal Grandmother    Diabetes Maternal Grandmother    Anemia Paternal Grandmother    Valvular heart disease Paternal Grandmother    Hypertension Mother    Prostate cancer Maternal Uncle        ? intestinal also    Medications: Patient's Medications  New Prescriptions   No medications on file  Previous Medications   ACETAMINOPHEN (TYLENOL) 500 MG TABLET    Take 1 tablet (500 mg total) by mouth every 6 (six) hours as needed.   ALBUTEROL (VENTOLIN HFA) 108 (90 BASE) MCG/ACT INHALER    Inhale 2 puffs into the lungs every 6 (six) hours as needed for wheezing or shortness of breath.   APIXABAN (ELIQUIS) 5 MG TABS TABLET    Take 1 tablet (5 mg total) by mouth 2 (two) times daily.   ATORVASTATIN (LIPITOR) 40 MG TABLET    Take 1 tablet (40 mg total) by mouth daily.   BD PEN NEEDLE NANO 2ND GEN 32G X 4 MM MISC    3 (three) times daily. as directed   CARVEDILOL (COREG) 6.25 MG TABLET    TAKE 1 TABLET BY MOUTH TWICE DAILY   CONTINUOUS GLUCOSE RECEIVER (DEXCOM G7 RECEIVER) DEVI    Dispense 1 receiver   CONTINUOUS GLUCOSE SENSOR (DEXCOM G7 SENSOR) MISC    Use 1 sensor every 10 days.   DICLOFENAC SODIUM (VOLTAREN) 1 % GEL    Apply 4 g topically 4 (four) times daily. Apply to left knee   HYDROXYZINE  (VISTARIL) 25 MG CAPSULE    TAKE 1 CAPSULE(25 MG) BY MOUTH AT BEDTIME AS NEEDED   INSULIN SYRINGE-NEEDLE U-100 (INSULIN SYRINGE .5CC/31GX5/16") 31G X 5/16" 0.5 ML MISC    3 (three) times daily.   JARDIANCE 10 MG TABS TABLET    TAKE 1 TABLET(10 MG) BY MOUTH EVERY MORNING   LOSARTAN (COZAAR) 25 MG TABLET    TAKE 1 TABLET BY MOUTH DAILY   METFORMIN (FORTAMET) 500 MG (OSM) 24 HR TABLET    Take 1,000 mg by mouth daily with breakfast.   METRONIDAZOLE (METROGEL) 0.75 % VAGINAL GEL    Place 1 Applicatorful vaginally daily. For 5 days   ONDANSETRON (ZOFRAN-ODT) 4 MG DISINTEGRATING TABLET    Take 1 tablet (  4 mg total) by mouth every 8 (eight) hours as needed for nausea or vomiting.   OZEMPIC, 0.25 OR 0.5 MG/DOSE, 2 MG/3ML SOPN    Inject 0.5 mg into the skin once a week.   QUETIAPINE (SEROQUEL) 50 MG TABLET    Take 1 tablet (50 mg total) by mouth at bedtime.   RIVASTIGMINE (EXELON) 3 MG CAPSULE    TAKE 1 CAPSULE(3 MG) BY MOUTH TWICE DAILY   SERTRALINE (ZOLOFT) 50 MG TABLET    Take 1 tablet (50 mg total) by mouth daily.  Modified Medications   No medications on file  Discontinued Medications   No medications on file    Physical Exam:  Vitals:   03/21/23 1030  BP: 134/80  Pulse: 61  Resp: 17  Temp: (!) 97.2 F (36.2 C)  SpO2: 97%  Weight: 222 lb 12.8 oz (101.1 kg)  Height: 5\' 6"  (1.676 m)   Body mass index is 35.96 kg/m. Wt Readings from Last 3 Encounters:  03/21/23 222 lb 12.8 oz (101.1 kg)  03/07/23 222 lb 3.2 oz (100.8 kg)  03/06/23 220 lb (99.8 kg)    Physical Exam Vitals reviewed.  Constitutional:      General: She is not in acute distress. HENT:     Head: Normocephalic.  Eyes:     General:        Right eye: No discharge.        Left eye: No discharge.  Cardiovascular:     Rate and Rhythm: Normal rate. Rhythm irregular.     Pulses: Normal pulses.     Heart sounds: Normal heart sounds.  Pulmonary:     Effort: Pulmonary effort is normal. No respiratory distress.     Breath  sounds: Normal breath sounds. No wheezing.  Abdominal:     General: Bowel sounds are normal.     Palpations: Abdomen is soft.  Musculoskeletal:     Cervical back: Neck supple.     Right lower leg: No edema.     Left lower leg: No edema.  Skin:    General: Skin is warm.     Capillary Refill: Capillary refill takes less than 2 seconds.  Neurological:     General: No focal deficit present.     Mental Status: She is alert and oriented to person, place, and time.  Psychiatric:        Mood and Affect: Mood normal.     Labs reviewed: Basic Metabolic Panel: Recent Labs    12/06/22 1052 01/08/23 1117 01/10/23 1115 03/05/23 1415  NA 136 138 137 139  K 4.1 3.5 4.0 4.0  CL 104 106 109 106  CO2 21 20* 19* 26  GLUCOSE 127 100* 104* 106*  BUN 11 10 13 13   CREATININE 0.91 0.76 0.83 0.70  CALCIUM 9.1 9.0 9.2 9.3  TSH 0.44  --   --   --    Liver Function Tests: Recent Labs    12/06/22 1052 01/08/23 1117  AST 14 22  ALT 10 19  ALKPHOS  --  97  BILITOT 0.2 0.4  PROT 7.1 7.0  ALBUMIN  --  3.6   Recent Labs    03/05/23 1415  LIPASE 29   No results for input(s): "AMMONIA" in the last 8760 hours. CBC: Recent Labs    12/06/22 1052 01/08/23 0928 01/10/23 1115 03/05/23 1415  WBC 5.9 5.7 5.2 6.4  NEUTROABS 2,755 2.3  --  2,938  HGB 13.5 13.9 14.9 13.6  HCT 44.4 44.3  47.5* 46.3*  MCV 73.3* 72.6* 71.8* 74.4*  PLT 263 227 282 298   Lipid Panel: Recent Labs    01/09/23 0454  CHOL 159  HDL 38*  LDLCALC 104*  TRIG 85  CHOLHDL 4.2   TSH: Recent Labs    12/06/22 1052  TSH 0.44   A1C: Lab Results  Component Value Date   HGBA1C 7.1 (H) 01/09/2023     Assessment/Plan 1. Essential hypertension - controlled - BUN/creat 13/0.70 03/05/2023 - cont losartan  2. Mild intermittent asthma without complication - no recent exacerbations - cont albuterol prn  3. Controlled type 2 diabetes mellitus with hyperglycemia, without long-term current use of insulin (HCC) -  followed by endocrinology - A1c 7.1 01/09/2023 - no hypoglycemias - now has Dexcom> education class today - foot exam done today - Microalbumin/Creatinine Ratio, Urine  4. 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 Neuropathy related with diabetes versus weakness - PT recommended OT> orders placed - will try low dose gabapentin for symptoms - gabapentin (NEURONTIN) 100 MG capsule; Take 2 capsules (200 mg total) by mouth at bedtime.  Dispense: 120 capsule; Refill: 3  5. Bacterial vaginosis - ongoing - vaginal swab + BV 11/19 - dysuria and discomfort resolved with Flagyl gel, but returned - consider gynecology visit if no improvement - metroNIDAZOLE (METROGEL) 0.75 % vaginal gel; Place 1 Applicatorful vaginally daily. For 5 days  Dispense: 70 g; Refill: 0  Total time: 32 minutes. Greater than 50% of total time spent doing patient education regarding health maintenance, HTN, asthma, left sided weakness, T2DM, and BV including symptom/medication management.   Next appt: 06/13/2023  Hazle Nordmann, Juel Burrow  [facility] [phone]

## 2023-03-21 NOTE — Patient Instructions (Addendum)
Start Gabapentin 200 mg- take 2 capsules every night for numbness and to help with sleep  Consider womens health probiotic for vaginal balance> if it doesn't work> please see gynecology> Dr. Barnet Pall [phone]  OT referral made> schedule with [facility] or [place]

## 2023-03-22 LAB — MICROALBUMIN / CREATININE URINE RATIO
Creatinine, Urine: 85 mg/dL (ref 20–275)
Microalb Creat Ratio: 13 mg/g{creat} (ref <30)
Microalb, Ur: 1.1 mg/dL

## 2023-03-25 ENCOUNTER — Encounter: Payer: Medicaid Other | Admitting: Physical Therapy

## 2023-03-26 ENCOUNTER — Ambulatory Visit: Payer: Self-pay

## 2023-03-27 ENCOUNTER — Ambulatory Visit: Payer: Medicaid Other | Admitting: Physical Therapy

## 2023-03-29 ENCOUNTER — Other Ambulatory Visit: Payer: Self-pay | Admitting: Licensed Clinical Social Worker

## 2023-03-29 ENCOUNTER — Encounter: Payer: Self-pay | Admitting: Licensed Clinical Social Worker

## 2023-03-29 DIAGNOSIS — F329 Major depressive disorder, single episode, unspecified: Secondary | ICD-10-CM

## 2023-03-29 NOTE — Patient Instructions (Signed)
Visit Information  Courtney Davies was given information about Medicaid Managed Care team care coordination services as a part of their [facility] Community Plan Medicaid benefit. Courtney Davies verbally consented to engagement with the [facility] Managed Care team.   If you are experiencing a medical emergency, please call 911 or report to your local emergency department or urgent care.   If you have a non-emergency medical problem during routine business hours, please contact your provider's office and ask to speak with a nurse.   For questions related to your [facility], please call: [phone] or visit the homepage here: [email or web address]  If you would like to schedule transportation through your [facility], please call the following number at least 2 days in advance of your appointment: [phone]   Rides for urgent appointments can also be made after hours by calling Member Services.  Call the Behavioral Health Crisis Line at [phone], at any time, 24 hours a day, 7 days a week. If you are in danger or need immediate medical attention call 911.  If you would like help to quit smoking, call 1-800-QUIT-NOW ((580)568-5811) OR Espaol: 1-855-Djelo-Ya (2-536-644-0347) o para ms informacin haga clic aqu or Text READY to 425-956 to register via text  Following is a copy of your plan of care:  Care Plan : LCSW Plan of Care  Updates made by Courtney Bryant, LCSW since 03/29/2023 12:00 AM     Problem: Social and Functional Symptoms      Goal: Social and Functional Skills Optimized   Start Date: 03/29/2023  Note:    Timeframe:  Short-Range Goal Priority:  High Start Date:   03/29/23          Expected End Date:  ongoing                     Follow Up Date--04/16/23 at 3 pm  - keep 90 percent of scheduled appointments -consider counseling or psychiatry -consider  bumping up your self-care  -consider creating a stronger support network   Why is this important?             Combating depression may take some time.            If you don't feel better right away, don't give up on your treatment plan.    Current barriers:   Chronic Mental Health needs related to symptoms of depression, stress and anxiety. Patient requires Support, Education, Resources, Referrals, Advocacy, and Care Coordination, in order to meet Unmet Mental Health Needs and to find a therapist and psychiatrist. Patient will implement clinical interventions discussed today to decrease symptoms of depression and increase knowledge and/or ability of: coping skills. Mental Health Concerns and Social Isolation Patient lacks knowledge of available community counseling agencies and resources.  Clinical Goal(s): verbalize understanding of plan for management of Anxiety, Depression, and Stress symptoms and demonstrate a reduction in symptoms. Patient will connect with a provider for ongoing mental health treatment, increase coping skills, healthy habits, self-management skills, and stress reduction      Patient Goals/Self-Care Activities: Take medications as prescribed   Attend all scheduled provider appointments Call pharmacy for medication refills 3-7 days in advance of running out of medications Perform all self care activities independently  Perform IADL's (shopping, preparing meals, housekeeping, managing finances) independently Call provider office for new concerns or questions Work with the social worker to address care coordination needs and will continue to work with  the clinical team to address health care and disease management related needs call 1-800-273-TALK (toll free, 24 hour hotline) If in a crisis, go to [facility] [address] [place] [phone]) call 911 if experiencing a Mental Health or Behavioral Health Crisis  Utilize healthy  coping skills and supportive resources discussed Contact PCP with any questions or concerns Keep 90 percent of counseling appointments Call your insurance provider for more information about your Enhanced Benefits  Check out counseling resources provided  Begin personal counseling with LCSW, to reduce and manage symptoms of Depression and Stress, until well-established with mental health provider Accept all calls from representative with Behavioral Health Providers in an effort to establish ongoing mental health counseling and supportive services. Incorporate into daily practice - relaxation techniques, deep breathing exercises, and mindfulness meditation strategies. Talk about feelings with friends, family members, spiritual advisor, etc. Contact LCSW directly [phone]), if you have questions, need assistance, or if additional social work needs are identified between now and our next scheduled telephone outreach call. Call 988 for mental health hotline/crisis line if needed (24/7 available) Try techniques to reduce symptoms of anxiety/negative thinking (deep breathing, distraction, positive self talk, etc)  - develop a personal safety plan - develop a plan to deal with triggers like holidays, anniversaries - exercise at least 2 to 3 times per week - have a plan for how to handle bad days - journal feelings and what helps to feel better or worse - spend time or talk with others at least 2 to 3 times per week - watch for early signs of feeling worse - begin personal counseling - call and visit an old friend - check out volunteer opportunities - join a support group - laugh; watch a funny movie or comedian - learn and use visualization or guided imagery - perform a random act of kindness - practice relaxation or meditation daily - start or continue a personal journal - practice positive thinking and self-talk -continue with compliance of taking medication  -identify current  effective and ineffective coping strategies.  -implement positive self-talk in care to increase self-esteem, confidence and feelings of control.  -consider alternative and complementary therapy approaches such as meditation, mindfulness or yoga.  Call your insurance provider to gain education on benefits if desired Call your primary care doctor if symptoms get worse  -journaling, prayer, worship services, meditation or pastoral counseling.  -increase participation in pleasurable group activities such as hobbies, singing, sports or volunteering).  -consider the use of meditative movement therapy such as tai chi, yoga or qigong.  -start a regular daily exercise program based on tolerance, ability and patient choice to support positive thinking and activity    Follow Up Plan:  The patient has been provided with contact information for the care management team and has been advised to call with any mental health or health related questions or concerns.  The care management team will reach out to the patient again over the next 30 business  days.   If you are experiencing a Mental Health or Behavioral Health Crisis or need someone to talk to, please call the Suicide and Crisis Lifeline: 988    Patient Goals: Initial goal     24- Hour Availability:    [facility]  [address] [place], [place] [phone] Crisis [phone]   [facility] [phone]  [place] [facility]  [phone]    [facility]  Services  [phone] (after hours)   Therapeutic Alternative/Mobile Crisis   7041828559   [place] National Suicide Hotline  (581)749-5268 Len Childs) OR 564-520-9459   Call 404-138-2399 for mental health emergencies   [facility]  ([phone]);  [place] and [employer]  [phone]); [place], [place], [place], [place], [place], [place], [place]    [facility] for  [facility] Residents For 24/7 walk-up access to mental health services for [facility] children (4+), adolescents and adults, please visit the [facility] located at [address] in [place], [place].  *[place] also provides comprehensive outpatient behavioral health services in a variety of locations around the [place].  Connect With [place] [address] [place], [place] [address] HelpLine: (305)176-0109 or 1-(510)799-3557  Get Directions  Find Help 24/7 By Phone Call our 24-hour HelpLine at 780-716-6412 or 865-612-2793 for immediate assistance for mental health and substance abuse issues.  Walk-In Help [place]: [facility] (Ages 4 and Up) [place]: Emergency Dept., [facility] Additional Resources National Hopeline Network: 1-800-SUICIDE The National Suicide Prevention Lifeline: 1-800-273-TALK     The following coping skill education was provided for stress relief and mental health management: "When your car dies or a deadline looms, how do you respond? Long-term, low-grade or acute stress takes a serious toll on your body and mind, so don't ignore feelings of constant tension. Stress is a natural part of life. However, too much stress can harm our health, especially if it continues every day. This is chronic stress and can put you at risk for heart problems like heart disease and depression. Understand what's happening inside your body and learn simple coping skills to combat the negative impacts of everyday stressors.  Types of Stress There are two types of stress: Emotional - types of emotional stress are relationship problems, pressure at work, financial worries, experiencing discrimination or having a major life change. Physical - Examples of physical stress include being sick having pain, not sleeping well, recovery from an injury or having an alcohol and drug use  disorder. Fight or Flight Sudden or ongoing stress activates your nervous system and floods your bloodstream with adrenaline and cortisol, two hormones that raise blood pressure, increase heart rate and spike blood sugar. These changes pitch your body into a fight or flight response. That enabled our ancestors to outrun saber-toothed tigers, and it's helpful today for situations like dodging a car accident. But most modern chronic stressors, such as finances or a challenging relationship, keep your body in that heightened state, which hurts your health. Effects of Too Much Stress If constantly under stress, most of [place] will eventually start to function less well.  Multiple studies link chronic stress to a higher risk of heart disease, stroke, depression, weight gain, memory loss and even premature death, so it's important to recognize the warning signals. Talk to your doctor about ways to manage stress if you're experiencing any of these symptoms: Prolonged periods of poor sleep. Regular, severe headaches. Unexplained weight loss or gain. Feelings of isolation, withdrawal or worthlessness. Constant anger and irritability. Loss of interest in activities. Constant worrying or obsessive thinking. Excessive alcohol or drug use. Inability to concentrate.  10 Ways to Cope with Chronic Stress It's key to recognize stressful situations as they occur because it allows you to focus on managing how you react. We all need to know when to close our eyes and take a deep breath when  we feel tension rising. Use these tips to prevent or reduce chronic stress. 1. Rebalance Work and Home All work and no play? If you're spending too much time at the office, intentionally put more dates in your calendar to enjoy time for fun, either alone or with others. 2. Get Regular Exercise Moving your body on a regular basis balances the nervous system and increases blood circulation, helping to flush out stress hormones. Even  a daily 20-minute walk makes a difference. Any kind of exercise can lower stress and improve your mood ? just pick activities that you enjoy and make it a regular habit. 3. Eat Well and Limit Alcohol and Stimulants Alcohol, nicotine and caffeine may temporarily relieve stress but have negative health impacts and can make stress worse in the long run. Well-nourished bodies cope better, so start with a good breakfast, add more organic fruits and vegetables for a well-balanced diet, avoid processed foods and sugar, try herbal tea and drink more water. 4. Connect with Supportive People Talking face to face with another person releases hormones that reduce stress. Lean on those good listeners in your life. 5. Carve Out Hobby Time Do you enjoy gardening, reading, listening to music or some other creative pursuit? Engage in activities that bring you pleasure and joy; research shows that reduces stress by almost half and lowers your heart rate, too. 6. Practice Meditation, Stress Reduction or Yoga Relaxation techniques activate a state of restfulness that counterbalances your body's fight-or-flight hormones. Even if this also means a 10-minute break in a long day: listen to music, read, go for a walk in nature, do a hobby, take a bath or spend time with a friend. Also consider doing a mindfulness exercise or try a daily deep breathing or imagery practice. Deep Breathing Slow, calm and deep breathing can help you relax. Try these steps to focus on your breathing and repeat as needed. Find a comfortable position and close your eyes. Exhale and drop your shoulders. Breathe in through your nose; fill your lungs and then your belly. Think of relaxing your body, quieting your mind and becoming calm and peaceful. Breathe out slowly through your nose, relaxing your belly. Think of releasing tension, pain, worries or distress. Repeat steps three and four until you feel relaxed. Imagery This involves using your mind  to excite the senses -- sound, vision, smell, taste and feeling. This may help ease your stress. Begin by getting comfortable and then do some slow breathing. Imagine a place you love being at. It could be somewhere from your childhood, somewhere you vacationed or just a place in your imagination. Feel how it is to be in the place you're imagining. Pay attention to the sounds, air, colors, and who is there with you. This is a place where you feel cared for and loved. All is well. You are safe. Take in all the smells, sounds, tastes and feelings. As you do, feel your body being nourished and healed. Feel the calm that surrounds you. Breathe in all the good. Breathe out any discomfort or tension. 7. Sleep Enough If you get less than seven to eight hours of sleep, your body won't tolerate stress as well as it could. If stress keeps you up at night, address the cause, and add extra meditation into your day to make up for the lost z's. Try to get seven to nine hours of sleep each night. Make a regular bedtime schedule. Keep your room dark and cool. Try to avoid computers,  TV, cell phones and tablets before bed. 8. Bond with Connections You Enjoy Go out for a coffee with a friend, chat with a neighbor, call a family member, visit with a clergy member, or even hang out with your pet. Clinical studies show that spending even a short time with a companion animal can cut anxiety levels almost in half. 9. Take a Vacation Getting away from it all can reset your stress tolerance by increasing your mental and emotional outlook, which makes you a happier, more productive person upon return. Leave your cellphone and laptop at home! 10. See a Counselor, Coach or Therapist If negative thoughts overwhelm your ability to make positive changes, it's time to seek professional help. Make an appointment today--your health and life are worth it."  Dickie La, BSW, MSW, LCSW Licensed Clinical Social Worker [employer]    [facility] [email or web address] Direct Dial: [phone]

## 2023-03-29 NOTE — Patient Outreach (Signed)
Medicaid Managed Care Social Work Note  03/29/2023 Name:  Courtney Davies MRN:  [id number] DOB:  07/22/1975  Courtney Davies is an 47 y.o. year old female who is a primary patient of [name], [name], NP.  The Medicaid Managed Care Coordination team was consulted for assistance with:  Mental Health Counseling and Resources  Ms. Bergstrom was given information about Medicaid Managed Care Coordination team services today. Courtney Davies Patient agreed to services and verbal consent obtained.  Engaged with patient  for by telephone forinitial visit in response to referral for case management and/or care coordination services.   Patient is participating in a Managed Medicaid Plan:  Yes  Assessments/Interventions:  Review of past medical history, allergies, medications, health status, including review of consultants reports, laboratory and other test data, was performed as part of comprehensive evaluation and provision of chronic care management services.  SDOH: (Social Drivers of Health) assessments and interventions performed: SDOH Interventions    Flowsheet Row Patient Outreach Telephone from 03/29/2023 in [place] [facility]PART[facility] Telephone from 01/25/2023 in [place] POPULATION HEALTH DEPARTMENT Telephone from 01/14/2023 in [facility] Community Care Coordination ED to Hosp-Admission (Discharged) from 01/08/2023 in [place] [facility] Visit from 03/01/2022 in [facility]  SDOH Interventions       Food Insecurity Interventions -- Other (Comment)  [name] refer patient to [name] for follow up] -- [id number] Referral, Inpatient TOC --  Transportation Interventions -- Other (Comment)  [referral to [employer] placed for [name] needs] Intervention Not Indicated  [pt states her mom will take her to appts or she has Medicaid transportation-she was given info in hospital and has number to call] Inpatient TOC --   Depression Interventions/Treatment  Medication, Referral to Psychiatry -- -- -- Medication, Currently on Treatment  Stress Interventions Community Resources Provided, Provide Counseling -- -- -- --       Advanced Directives Status:  See Care Plan for related entries.  Care Plan                 Allergies  Allergen Reactions   Latex Hives   Other Other (See Comments), Anaphylaxis, Swelling and Hives    Hair glue causes throat to close and hives "glue" Hair glue causes throat to close    Codeine Nausea And Vomiting    Was on an empty stomach.   Nitrofurantoin Rash    Medications Reviewed Today     Reviewed by Gustavus Bryant, LCSW (Social Worker) on 03/29/23 at 1518  Med List Status: <None>   Medication Order Taking? Sig Documenting Provider Last Dose Status Informant  acetaminophen (TYLENOL) 500 MG tablet [id number] No Take 1 tablet (500 mg total) by mouth every 6 (six) hours as needed. Davies, Courtney A, PA-C Taking Active Self, Mother, Pharmacy Records  albuterol (VENTOLIN HFA) 108 (90 Base) MCG/ACT inhaler [id number] No Inhale 2 puffs into the lungs every 6 (six) hours as needed for wheezing or shortness of breath. [name], PA-C Taking Active Self, Mother, Pharmacy Records  apixaban (ELIQUIS) 5 MG TABS tablet [id number] No Take 1 tablet (5 mg total) by mouth 2 (two) times daily. [name], NP Taking Active            Med Note Normand Sloop, JASMINE E   Tue Mar 05, 2023  1:11 PM)    atorvastatin (LIPITOR) 40 MG tablet [id number] No Take 1 tablet (40 mg total) by mouth daily.  [name], NP Taking Active   BD PEN NEEDLE NANO 2ND GEN 32G X 4 MM MISC [id number] No 3 (three) times daily. as directed [provider] Taking Active Self, Mother, Pharmacy Records  carvedilol (COREG) 6.25 MG tablet [id number] No TAKE 1 TABLET BY MOUTH TWICE DAILY [name], [name], NP Taking Active   Continuous Glucose Receiver (DEXCOM G7 RECEIVER) Hardie Pulley [id number] No Dispense 1 receiver [provider] Taking Active   Continuous Glucose Sensor (DEXCOM G7 SENSOR) MISC [id number] No Use 1 sensor every 10 days. [provider] Taking Active   diclofenac Sodium (VOLTAREN) 1 % GEL [id number] No Apply 4 g topically 4 (four) times daily. Apply to left knee Davies, Courtney C, NP Taking Active Self, Mother, Pharmacy Records  gabapentin (NEURONTIN) 100 MG capsule [id number]  Take 2 capsules (200 mg total) by mouth at bedtime. [name], [name], NP  Active   hydrOXYzine (VISTARIL) 25 MG capsule [id number] No TAKE 1 CAPSULE(25 MG) BY MOUTH AT BEDTIME AS NEEDED [name], [name], NP Taking Active   Insulin Syringe-Needle U-100 (INSULIN SYRINGE .5CC/31GX5/16") 31G X 5/16" 0.5 ML MISC [id number] No 3 (three) times daily. [provider] Taking Active Self, Mother, Pharmacy Records  JARDIANCE 10 MG TABS tablet [id number] No TAKE 1 TABLET(10 MG) BY MOUTH EVERY MORNING [name], [name], NP Taking Active   losartan (COZAAR) 25 MG tablet [id number] No TAKE 1 TABLET BY MOUTH DAILY [name], [name], NP Taking Active   metformin (FORTAMET) 500 MG (OSM) 24 hr tablet [id number] No Take 1,000 mg by mouth daily with breakfast. [provider] Taking Active Self, Mother, Pharmacy Records  metroNIDAZOLE (METROGEL) 0.75 % vaginal gel [id number]  Place 1 Applicatorful vaginally daily. For 5 days [name], NP  Active   ondansetron (ZOFRAN-ODT) 4 MG disintegrating tablet [id number] No Take 1 tablet (4 mg total) by mouth every 8 (eight) hours as needed for nausea or vomiting. [name], MD Taking Active   OZEMPIC, 0.25 OR 0.5 MG/DOSE, 2 MG/3ML SOPN [id number] No Inject 0.5 mg into the skin once a week. [provider] Taking Active Self, Mother, Pharmacy Records  QUEtiapine (SEROQUEL) 50 MG tablet [id number] No Take 1 tablet (50 mg total) by mouth at bedtime. [name], MD Taking Active   rivastigmine (EXELON) 3 MG capsule [id number] No TAKE 1 CAPSULE(3 MG) BY MOUTH TWICE DAILY [name], MD Taking Active Self, Mother, Pharmacy Records  sertraline (ZOLOFT) 50 MG tablet [id number] No Take 1 tablet (50 mg total) by mouth daily. 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   Cough 04/26/2021   Acute blood loss anemia    Controlled type 2 diabetes mellitus with hyperglycemia (HCC)    Essential hypertension    Anoxic brain injury (HCC) 01/02/2021   Fever 12/20/2020   Abdominal wall cellulitis 12/20/2020   Dysphagia    Goals of care, counseling/discussion    Acute systolic heart failure (HCC)    Encephalopathy acute    Type 1 diabetes mellitus without complication (HCC) 12/02/2020   Hypertension 12/02/2020   GERD (gastroesophageal reflux disease) 12/02/2020   Anemia 12/02/2020   Cardiac arrest (HCC) 12/01/2020   Unilateral  primary osteoarthritis, left knee 12/01/2018   Lateral meniscus, posterior horn derangement, left 05/14/2018   Status post arthroscopy of left knee 05/14/2018   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/19/2017   Anemia 04/23/2017   Acute bronchitis 04/23/2017   Chronic back pain 10/03/2015   Symptomatic anemia 04/10/2014   Bilateral edema of lower extremity    Low back pain with left-sided sciatica     Conditions to be addressed/monitored per PCP order:  Anxiety and Depression  Care Plan : LCSW Plan of Care  Updates made by Gustavus Bryant, LCSW since 03/29/2023 12:00 AM     Problem: Social and Functional Symptoms      Goal: Social and Functional Skills Optimized   Start Date: 03/29/2023  Note:    Timeframe:  Short-Range Goal Priority:  High Start Date:   03/29/23          Expected End Date:  ongoing                      Follow Up Date--04/16/23 at 3 pm  - keep 90 percent of scheduled appointments -consider counseling or psychiatry -consider bumping up your self-care  -consider creating a stronger support network   Why is this important?             Combating depression may take some time.            If you don't feel better right away, don't give up on your treatment plan.    Current barriers:   Chronic Mental Health needs related to symptoms of depression, stress and anxiety. Patient requires Support, Education, Resources, Referrals, Advocacy, and Care Coordination, in order to meet Unmet Mental Health Needs and to find a therapist and psychiatrist. Patient will implement clinical interventions discussed today to decrease symptoms of depression and increase knowledge and/or ability of: coping skills. Mental Health Concerns and Social Isolation Patient lacks knowledge of available community counseling agencies and resources.  Clinical Goal(s): verbalize understanding of plan for management of Anxiety, Depression, and Stress symptoms and demonstrate a reduction in symptoms. Patient will connect with a provider for ongoing mental health treatment, increase coping skills, healthy habits, self-management skills, and stress reduction        Clinical Interventions:  Assessed patient's previous and current treatment, coping skills, support system and barriers to care. Patient provided hx  Verbalization of feelings encouraged, motivational interviewing employed Emotional support provided, positive coping strategies explored. Establishing healthy boundaries emphasized and healthy self-care education provided Patient was educated on available mental health resources within their area that accept Medicaid and offer counseling and psychiatry. Patient was advised to contact the back of her insurance card for assistance with benefits as well. Patient is agreeable to referral to [facility] for counseling and psychiatry.  [facility] LCSW made referral on 03/29/23 with patient's permission. Patient educated on the difference between therapy and psychiatry per patient request. Email sent to patient today with available mental health resources within her area that accept Medicaid and offer the services that she is interested in. Email included instructions for scheduling at [facility] as well as some crisis support resources and [facility]'s walk in clinic hours. Patient was educated on the "988" crisis line as well.  Emotional support provided. CBT intervention implemented regarding "being mentally fit" by combating negative thinking and replacing it with uplifting support, hope and positivity. Patient reports that her current medication for depression and anxiety are no longer working effectively for her.  Patient reports  significant worsening anxiety impacting her ability to function appropriately and carry out daily task. Assessed social determinant of health barriers Patient endorsed need for transportation to medical appointments and running errands. LCSW discussed Medicaid and [facility].  Patient receives strong support from sister.  LCSW provided education on relaxation techniques such as meditation, deep breathing, massage, grounding exercises or yoga that can activate the body's relaxation response and ease symptoms of stress and anxiety. LCSW ask that when pt is struggling with difficult emotions and racing thoughts that they start this relaxation response process. LCSW provided extensive education on healthy coping skills for anxiety. SW used active and reflective listening, validated patient's feelings/concerns, and provided emotional support. Patient will work on implementing appropriate self-care habits into their daily routine such as: staying positive, writing a gratitude list, drinking water, staying active around the house, taking their medications as prescribed, combating negative thoughts or emotions and staying  connected with their family and friends. Positive reinforcement provided for this decision to work on this. LCSW provided education on healthy sleep hygiene and what that looks like. LCSW encouraged patient to implement a night time routine into their schedule that works best for them and that they are able to maintain. Advised patient to implement deep breathing/grounding/meditation/self-care exercises into their nightly routine to combat racing thoughts at night. LCSW encouraged patient to wake up at the same time each day, make their sleeping environment comfortable, exercise when able, to limit naps and to not eat or drink anything right before bed.  Motivational Interviewing employed Depression screen reviewed  PHQ2/ PHQ9 completed or reviewed  Mindfulness or Relaxation training provided Active listening / Reflection utilized  Advance Care and HCPOA education provided Emotional Support Provided Problem Solving /Task Center strategies reviewed Provided psychoeducation for mental health needs  Provided brief CBT  Reviewed mental health medications and discussed importance of compliance:  Quality of sleep assessed & Sleep Hygiene techniques promoted  Participation in counseling encouraged  Verbalization of feelings encouraged  Suicidal Ideation/Homicidal Ideation assessed: Patient denies SI/HI  Review resources, discussed options and provided patient information about  Mental Health Resources Inter-disciplinary care team collaboration (see longitudinal plan of care)  Patient Goals/Self-Care Activities: Take medications as prescribed   Attend all scheduled provider appointments Call pharmacy for medication refills 3-7 days in advance of running out of medications Perform all self care activities independently  Perform IADL's (shopping, preparing meals, housekeeping, managing finances) independently Call provider office for new concerns or questions Work with the social worker to address  care coordination needs and will continue to work with the clinical team to address health care and disease management related needs call 1-800-273-TALK (toll free, 24 hour hotline) If in a crisis, go to [facility] [address], [place] [phone]) call 911 if experiencing a Mental Health or Behavioral Health Crisis  Utilize healthy coping skills and supportive resources discussed Contact PCP with any questions or concerns Keep 90 percent of counseling appointments Call your insurance provider for more information about your Enhanced Benefits  Check out counseling resources provided  Begin personal counseling with LCSW, to reduce and manage symptoms of Depression and Stress, until well-established with mental health provider Accept all calls from representative with Behavioral Health Providers in an effort to establish ongoing mental health counseling and supportive services. Incorporate into daily practice - relaxation techniques, deep breathing exercises, and mindfulness meditation strategies. Talk about feelings with friends, family members, spiritual advisor, etc. Contact LCSW directly [phone]), if you have  questions, need assistance, or if additional social work needs are identified between now and our next scheduled telephone outreach call. Call 988 for mental health hotline/crisis line if needed (24/7 available) Try techniques to reduce symptoms of anxiety/negative thinking (deep breathing, distraction, positive self talk, etc)  - develop a personal safety plan - develop a plan to deal with triggers like holidays, anniversaries - exercise at least 2 to 3 times per week - have a plan for how to handle bad days - journal feelings and what helps to feel better or worse - spend time or talk with others at least 2 to 3 times per week - watch for early signs of feeling worse - begin personal counseling - call and visit an old friend -  check out volunteer opportunities - join a support group - laugh; watch a funny movie or comedian - learn and use visualization or guided imagery - perform a random act of kindness - practice relaxation or meditation daily - start or continue a personal journal - practice positive thinking and self-talk -continue with compliance of taking medication  -identify current effective and ineffective coping strategies.  -implement positive self-talk in care to increase self-esteem, confidence and feelings of control.  -consider alternative and complementary therapy approaches such as meditation, mindfulness or yoga.  Call your insurance provider to gain education on benefits if desired Call your primary care doctor if symptoms get worse  -journaling, prayer, worship services, meditation or pastoral counseling.  -increase participation in pleasurable group activities such as hobbies, singing, sports or volunteering).  -consider the use of meditative movement therapy such as tai chi, yoga or qigong.  -start a regular daily exercise program based on tolerance, ability and patient choice to support positive thinking and activity    Follow Up Plan:  The patient has been provided with contact information for the care management team and has been advised to call with any mental health or health related questions or concerns.  The care management team will reach out to the patient again over the next 30 business  days.   If you are experiencing a Mental Health or Behavioral Health Crisis or need someone to talk to, please call the Suicide and Crisis Lifeline: 988    Patient Goals: Initial goal       Follow up:  Patient agrees to Care Plan and Follow-up.  Plan: The Managed Medicaid care management team will reach out to the patient again over the next 30 days.  Dickie La, BSW, MSW, LCSW Licensed Clinical Social Worker [employer]   [facility] [email or web address] Direct Dial: [phone]

## 2023-04-01 ENCOUNTER — Ambulatory Visit: Payer: Self-pay | Admitting: Physical Therapy

## 2023-04-15 ENCOUNTER — Ambulatory Visit: Payer: Medicaid Other | Admitting: Physical Therapy

## 2023-04-16 ENCOUNTER — Other Ambulatory Visit: Payer: Self-pay | Admitting: Licensed Clinical Social Worker

## 2023-04-16 NOTE — Patient Outreach (Signed)
 Medicaid Managed Care Social Work Note  04/16/2023 Name:  Courtney Davies MRN:  [id number] DOB:  05-11-1975  Courtney Davies is an 47 y.o. year old female who is a primary patient of [name], Courtney E, NP.  The Medicaid Managed Care Coordination team was consulted for assistance with:  Mental Health Counseling and Resources  Ms. Courtney Davies was given information about Medicaid Managed Care Coordination team services today. Courtney Davies Patient agreed to services and verbal consent obtained.  Engaged with patient  for by telephone forfollow up visit in response to referral for case management and/or care coordination services.   Patient is participating in a Managed Medicaid Plan:  Yes  Assessments/Interventions:  Review of past medical history, allergies, medications, health status, including review of consultants reports, laboratory and other test data, was performed as part of comprehensive evaluation and provision of chronic care management services.  SDOH: (Social Drivers of Health) assessments and interventions performed: SDOH Interventions    Flowsheet Row Patient Outreach Telephone from 04/16/2023 in [place] [facility] DEPART[facility] Patient Outreach Telephone from 03/29/2023 in [place] POPULATION HEALTH DEPARTMENT Telephone from 01/25/2023 in [place] POPULATION HEALTH DEPARTMENT Telephone from 01/14/2023 in [facility] Community Care Coordination ED to Hosp-Admission (Discharged) from 01/08/2023 in [place] [facility] Visit from 03/01/2022 in [facility]  SDOH Interventions        Food Insecurity Interventions -- -- Other (Comment)  marcina refer patient to Thersia Hoar for follow up] -- [id number] Referral, Inpatient TOC --  Transportation Interventions Payor Benefit -- Other (Comment)  [referral to [employer] placed for [name] needs] Intervention Not Indicated  [pt states her mom will take her to appts or she  has Medicaid transportation-she was given info in hospital and has number to call] Inpatient TOC --  Depression Interventions/Treatment  -- Medication, Referral to Psychiatry -- -- -- Medication, Currently on Treatment  Stress Interventions -- [employer] Provided, Provide Counseling -- -- -- --       Advanced Directives Status:  See Care Plan for related entries.  Care Plan                 Allergies  Allergen Reactions   Latex Hives   Other Other (See Comments), Anaphylaxis, Swelling and Hives    Hair glue causes throat to close and hives glue Hair glue causes throat to close    Codeine  Nausea And Vomiting    Was on an empty stomach.   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   Cough 04/26/2021   Acute blood loss anemia    Controlled type 2 diabetes mellitus with hyperglycemia (HCC)    Essential hypertension    Anoxic brain injury (HCC) 01/02/2021   Fever 12/20/2020   Abdominal wall cellulitis 12/20/2020   Dysphagia    Goals of care, counseling/discussion    Acute systolic heart failure (HCC)    Encephalopathy acute    Type 1 diabetes mellitus without complication (HCC) 12/02/2020   Hypertension 12/02/2020   GERD (gastroesophageal reflux disease) 12/02/2020   Anemia 12/02/2020   Cardiac arrest (HCC) 12/01/2020   Unilateral primary  osteoarthritis, left knee 12/01/2018   Lateral meniscus, posterior horn derangement, left 05/14/2018   Status post arthroscopy of left knee 05/14/2018   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/19/2017   Anemia 04/23/2017   Acute bronchitis 04/23/2017   Chronic back pain 10/03/2015    Symptomatic anemia 04/10/2014   Bilateral edema of lower extremity    Low back pain with left-sided sciatica     Conditions to be addressed/monitored per PCP order:   Stress  Care Plan : LCSW Plan of Care  Updates made by Merlynn Lyle [name], LCSW since 04/16/2023 12:00 AM     Problem: Social and Functional Symptoms      Goal: Social and Functional Skills Optimized   Start Date: 03/29/2023  Note:    Timeframe:  Short-Range Goal Priority:  High Start Date:   03/29/23          Expected End Date:  ongoing                     Follow Up Date--05/14/22 at 3 pm  - keep 90 percent of scheduled appointments -consider counseling or psychiatry -consider bumping up your self-care  -consider creating a stronger support network   Why is this important?             Combating depression may take some time.            If you don't feel better right away, don't give up on your treatment plan.    Current barriers:   Chronic Mental Health needs related to symptoms of depression, stress and anxiety. Patient requires Support, Education, Resources, Referrals, Advocacy, and Care Coordination, in order to meet Unmet Mental Health Needs and to find a therapist and psychiatrist. Patient will implement clinical interventions discussed today to decrease symptoms of depression and increase knowledge and/or ability of: coping skills. Mental Health Concerns and Social Isolation Patient lacks knowledge of available community counseling agencies and resources.  Clinical Goal(s): verbalize understanding of plan for management of Anxiety, Depression, and Stress symptoms and demonstrate a reduction in symptoms. Patient will connect with a provider for ongoing mental health treatment, increase coping skills, healthy habits, self-management skills, and stress reduction        Clinical Interventions:  Assessed patient's previous and current treatment, coping skills, support system and barriers to care. Patient  provided hx  Verbalization of feelings encouraged, motivational interviewing employed Emotional support provided, positive coping strategies explored. Establishing healthy boundaries emphasized and healthy self-care education provided Patient was educated on available mental health resources within their area that accept Medicaid and offer counseling and psychiatry. Patient was advised to contact the back of her insurance card for assistance with benefits as well. Patient is agreeable to referral to [facility] for counseling and psychiatry. [facility] LCSW made referral on 03/29/23 with patient's permission. Patient educated on the difference between therapy and psychiatry per patient request. Email sent to patient today with available mental health resources within her area that accept Medicaid and offer the services that she is interested in. Email included instructions for scheduling at [facility] as well as some crisis support resources and [facility]'s walk in clinic hours. Patient was educated on the 988 crisis line as well.  Emotional support provided. CBT intervention implemented regarding being mentally fit by combating negative thinking and replacing it with uplifting support, hope and positivity. Patient reports that her current medication for depression and anxiety are no longer working effectively for her.  Patient reports significant worsening  anxiety impacting her ability to function appropriately and carry out daily task. Assessed social determinant of health barriers Patient endorsed need for transportation to medical appointments and running errands. LCSW discussed Medicaid and [facility].  Patient receives strong support from sister.  LCSW provided education on relaxation techniques such as meditation, deep breathing, massage, grounding exercises or yoga that can activate the body's relaxation response and ease symptoms of stress and anxiety. LCSW ask that when pt is struggling with difficult  emotions and racing thoughts that they start this relaxation response process. LCSW provided extensive education on healthy coping skills for anxiety. SW used active and reflective listening, validated patient's feelings/concerns, and provided emotional support. Patient will work on implementing appropriate self-care habits into their daily routine such as: staying positive, writing a gratitude list, drinking water , staying active around the house, taking their medications as prescribed, combating negative thoughts or emotions and staying connected with their family and friends. Positive reinforcement provided for this decision to work on this. LCSW provided education on healthy sleep hygiene and what that looks like. LCSW encouraged patient to implement a night time routine into their schedule that works best for them and that they are able to maintain. Advised patient to implement deep breathing/grounding/meditation/self-care exercises into their nightly routine to combat racing thoughts at night. LCSW encouraged patient to wake up at the same time each day, make their sleeping environment comfortable, exercise when able, to limit naps and to not eat or drink anything right before bed.  Motivational Interviewing employed Depression screen reviewed  PHQ2/ PHQ9 completed or reviewed  Mindfulness or Relaxation training provided Active listening / Reflection utilized  Advance Care and HCPOA education provided Emotional Support Provided Problem Solving /Task Center strategies reviewed Provided psychoeducation for mental health needs  Provided brief CBT  Reviewed mental health medications and discussed importance of compliance:  Quality of sleep assessed & Sleep Hygiene techniques promoted  Participation in counseling encouraged  Verbalization of feelings encouraged  Suicidal Ideation/Homicidal Ideation assessed: Patient denies SI/HI  Review resources, discussed options and provided patient  information about  Mental Health Resources Inter-disciplinary care team collaboration (see longitudinal plan of care) Patient has been successfully scheduled for both psychiatry and counseling at [facility]. She reports being stable at this time and will utilize [facility] walk in clinic IF needed before her initial [facility] appointments set for 05/16/23 at 05/22/23.   Patient Goals/Self-Care Activities: Take medications as prescribed   Attend all scheduled provider appointments Call pharmacy for medication refills 3-7 days in advance of running out of medications Perform all self care activities independently  Perform IADL's (shopping, preparing meals, housekeeping, managing finances) independently Call provider office for new concerns or questions Work with the social worker to address care coordination needs and will continue to work with the clinical team to address health care and disease management related needs call 1-800-273-TALK (toll free, 24 hour hotline) If in a crisis, go to [facility] [address] [place] [phone]) call 911 if experiencing a Mental Health or Behavioral Health Crisis  Utilize healthy coping skills and supportive resources discussed Contact PCP with any questions or concerns Keep 90 percent of counseling appointments Call your insurance provider for more information about your Enhanced Benefits  Check out counseling resources provided  Begin personal counseling with LCSW, to reduce and manage symptoms of Depression and Stress, until well-established with mental health provider Accept all calls from representative with Behavioral Health Providers in an effort to establish ongoing  mental health counseling and supportive services. Incorporate into daily practice - relaxation techniques, deep breathing exercises, and mindfulness meditation strategies. Talk about feelings with friends, family members, spiritual advisor, etc. Contact LCSW  directly [phone]), if you have questions, need assistance, or if additional social work needs are identified between now and our next scheduled telephone outreach call. Call 988 for mental health hotline/crisis line if needed (24/7 available) Try techniques to reduce symptoms of anxiety/negative thinking (deep breathing, distraction, positive self talk, etc)  - develop a personal safety plan - develop a plan to deal with triggers like holidays, anniversaries - exercise at least 2 to 3 times per week - have a plan for how to handle bad days - journal feelings and what helps to feel better or worse - spend time or talk with others at least 2 to 3 times per week - watch for early signs of feeling worse - begin personal counseling - call and visit an old friend - check out volunteer opportunities - join a support group - laugh; watch a funny movie or comedian - learn and use visualization or guided imagery - perform a random act of kindness - practice relaxation or meditation daily - start or continue a personal journal - practice positive thinking and self-talk -continue with compliance of taking medication  -identify current effective and ineffective coping strategies.  -implement positive self-talk in care to increase self-esteem, confidence and feelings of control.  -consider alternative and complementary therapy approaches such as meditation, mindfulness or yoga.  Call your insurance provider to gain education on benefits if desired Call your primary care doctor if symptoms get worse  -journaling, prayer, worship services, meditation or pastoral counseling.  -increase participation in pleasurable group activities such as hobbies, singing, sports or volunteering).  -consider the use of meditative movement therapy such as tai chi, yoga or qigong.  -start a regular daily exercise program based on tolerance, ability and patient choice to support positive thinking and activity     Follow Up Plan:  The patient has been provided with contact information for the care management team and has been advised to call with any mental health or health related questions or concerns.  The care management team will reach out to the patient again over the next 30 business  days.   If you are experiencing a Mental Health or Behavioral Health Crisis or need someone to talk to, please call the Suicide and Crisis Lifeline: 988    Patient Goals: Follow up goal       Follow up:  Patient agrees to Care Plan and Follow-up.  Plan: The Managed Medicaid care management team will reach out to the patient again over the next 30 days.  Lyle Rung, BSW, MSW, LCSW Licensed Clinical Social Worker [employer]   [facility] [email or web address] Direct Dial: [phone]

## 2023-04-16 NOTE — Patient Instructions (Signed)
 Visit Information  Courtney Davies was given information about Medicaid Managed Care team care coordination services as a part of their [facility] Community Plan Medicaid benefit. Courtney Davies verbally consented to engagement with the [facility] Managed Care team.   If you are experiencing a medical emergency, please call 911 or report to your local emergency department or urgent care.   If you have a non-emergency medical problem during routine business hours, please contact your provider's office and ask to speak with a nurse.   For questions related to your [facility], please call: [phone] or visit the homepage here: [email or web address]  If you would like to schedule transportation through your [facility], please call the following number at least 2 days in advance of your appointment: [phone]   Rides for urgent appointments can also be made after hours by calling Member Services.  Call the Behavioral Health Crisis Line at [phone], at any time, 24 hours a day, 7 days a week. If you are in danger or need immediate medical attention call 911.  If you would like help to quit smoking, call 1-800-QUIT-NOW (838-656-4152) OR Espaol: 1-855-Djelo-Ya (8-144-664-6430) o para ms informacin haga clic aqu or Text READY to 799-599 to register via text  Following is a copy of your plan of care:  Care Plan : LCSW Plan of Care  Updates made by Merlynn Lyle [name], LCSW since 04/16/2023 12:00 AM     Problem: Social and Functional Symptoms      Goal: Social and Functional Skills Optimized   Start Date: 03/29/2023  Note:    Timeframe:  Short-Range Goal Priority:  High Start Date:   03/29/23          Expected End Date:  ongoing                     Follow Up Date--05/14/22 at 3 pm  - keep 90 percent of scheduled appointments -consider counseling or psychiatry -consider  bumping up your self-care  -consider creating a stronger support network   Why is this important?             Combating depression may take some time.            If you don't feel better right away, don't give up on your treatment plan.    Current barriers:   Chronic Mental Health needs related to symptoms of depression, stress and anxiety. Patient requires Support, Education, Resources, Referrals, Advocacy, and Care Coordination, in order to meet Unmet Mental Health Needs and to find a therapist and psychiatrist. Patient will implement clinical interventions discussed today to decrease symptoms of depression and increase knowledge and/or ability of: coping skills. Mental Health Concerns and Social Isolation Patient lacks knowledge of available community counseling agencies and resources.  Clinical Goal(s): verbalize understanding of plan for management of Anxiety, Depression, and Stress symptoms and demonstrate a reduction in symptoms. Patient will connect with a provider for ongoing mental health treatment, increase coping skills, healthy habits, self-management skills, and stress reduction        Patient Goals/Self-Care Activities: Take medications as prescribed   Attend all scheduled provider appointments Call pharmacy for medication refills 3-7 days in advance of running out of medications Perform all self care activities independently  Perform IADL's (shopping, preparing meals, housekeeping, managing finances) independently Call provider office for new concerns or questions Work with the social worker to address care coordination needs and will continue to  work with the clinical team to address health care and disease management related needs call 1-800-273-TALK (toll free, 24 hour hotline) If in a crisis, go to [facility] [address] [place] [phone]) call 911 if experiencing a Mental Health or Behavioral Health Crisis  Utilize healthy  coping skills and supportive resources discussed Contact PCP with any questions or concerns Keep 90 percent of counseling appointments Call your insurance provider for more information about your Enhanced Benefits  Check out counseling resources provided  Begin personal counseling with LCSW, to reduce and manage symptoms of Depression and Stress, until well-established with mental health provider Accept all calls from representative with Behavioral Health Providers in an effort to establish ongoing mental health counseling and supportive services. Incorporate into daily practice - relaxation techniques, deep breathing exercises, and mindfulness meditation strategies. Talk about feelings with friends, family members, spiritual advisor, etc. Contact LCSW directly [phone]), if you have questions, need assistance, or if additional social work needs are identified between now and our next scheduled telephone outreach call. Call 988 for mental health hotline/crisis line if needed (24/7 available) Try techniques to reduce symptoms of anxiety/negative thinking (deep breathing, distraction, positive self talk, etc)  - develop a personal safety plan - develop a plan to deal with triggers like holidays, anniversaries - exercise at least 2 to 3 times per week - have a plan for how to handle bad days - journal feelings and what helps to feel better or worse - spend time or talk with others at least 2 to 3 times per week - watch for early signs of feeling worse - begin personal counseling - call and visit an old friend - check out volunteer opportunities - join a support group - laugh; watch a funny movie or comedian - learn and use visualization or guided imagery - perform a random act of kindness - practice relaxation or meditation daily - start or continue a personal journal - practice positive thinking and self-talk -continue with compliance of taking medication  -identify current  effective and ineffective coping strategies.  -implement positive self-talk in care to increase self-esteem, confidence and feelings of control.  -consider alternative and complementary therapy approaches such as meditation, mindfulness or yoga.  Call your insurance provider to gain education on benefits if desired Call your primary care doctor if symptoms get worse  -journaling, prayer, worship services, meditation or pastoral counseling.  -increase participation in pleasurable group activities such as hobbies, singing, sports or volunteering).  -consider the use of meditative movement therapy such as tai chi, yoga or qigong.  -start a regular daily exercise program based on tolerance, ability and patient choice to support positive thinking and activity    Follow Up Plan:  The patient has been provided with contact information for the care management team and has been advised to call with any mental health or health related questions or concerns.  The care management team will reach out to the patient again over the next 30 business  days.   If you are experiencing a Mental Health or Behavioral Health Crisis or need someone to talk to, please call the Suicide and Crisis Lifeline: 988    Patient Goals: Follow up goal     24- Hour Availability:    [facility]  [address] [place], [place] [phone] Crisis [phone]   [facility] [phone]  [employer] [facility]  [phone]    [facility]  [facility]  [phone] (after hours)   Therapeutic Alternative/Mobile Crisis   [phone]   [place]  National Suicide Hotline  (817) 557-4813 [place]) OR 988   Call 988 for mental health emergencies   [facility]  [phone]);  [place] and [employer]  [phone]); [place], [place], [place], [place], [place], [address], [place]    [facility] for  [facility] Residents For 24/7 walk-up access to mental health services for [facility] children (4+), adolescents and adults, please visit the [facility] located at [address] in [place], [place].  *[facility] also provides comprehensive outpatient behavioral health services in a variety of locations around the [place].  Connect With Us  [address] [place], [place] [address] HelpLine: (802) 223-2224 or [phone]  Get Directions  Find Help 24/7 By Phone Call our 24-hour HelpLine at 501-077-1579 or (442)853-0441 for immediate assistance for mental health and substance abuse issues.  Walk-In Help [place]: [facility] (Ages 4 and Up) [place]: Emergency Dept., [facility] Additional Resources National Hopeline Network: 1-800-SUICIDE The National Suicide Prevention Lifeline: 1-800-273-TALK     The following coping skill education was provided for stress relief and mental health management: When your car dies or a deadline looms, how do you respond? Long-term, low-grade or acute stress takes a serious toll on your body and mind, so don't ignore feelings of constant tension. Stress is a natural part of life. However, too much stress can harm our health, especially if it continues every day. This is chronic stress and can put you at risk for heart problems like heart disease and depression. Understand what's happening inside your body and learn simple coping skills to combat the negative impacts of everyday stressors.  Types of Stress There are two types of stress: Emotional - types of emotional stress are relationship problems, pressure at work, financial worries, experiencing discrimination or having a major life change. Physical - Examples of physical stress include being sick having pain, not sleeping well, recovery from an injury or having an alcohol and drug use  disorder. Fight or Flight Sudden or ongoing stress activates your nervous system and floods your bloodstream with adrenaline and cortisol, two hormones that raise blood pressure, increase heart rate and spike blood sugar. These changes pitch your body into a fight or flight response. That enabled our ancestors to outrun saber-toothed tigers, and it's helpful today for situations like dodging a car accident. But most modern chronic stressors, such as finances or a challenging relationship, keep your body in that heightened state, which hurts your health. Effects of Too Much Stress If constantly under stress, most of us  will eventually start to function less well.  Multiple studies link chronic stress to a higher risk of heart disease, stroke, depression, weight gain, memory loss and even premature death, so it's important to recognize the warning signals. Talk to your doctor about ways to manage stress if you're experiencing any of these symptoms: Prolonged periods of poor sleep. Regular, severe headaches. Unexplained weight loss or gain. Feelings of isolation, withdrawal or worthlessness. Constant anger and irritability. Loss of interest in activities. Constant worrying or obsessive thinking. Excessive alcohol or drug use. Inability to concentrate.  10 Ways to Cope with Chronic Stress It's key to recognize stressful situations as they occur because it allows you to focus on managing how you react. We all need to know when to close our eyes and take a  deep breath when we feel tension rising. Use these tips to prevent or reduce chronic stress. 1. Rebalance Work and Home All work and no play? If you're spending too much time at the office, intentionally put more dates in your calendar to enjoy time for fun, either alone or with others. 2. Get Regular Exercise Moving your body on a regular basis balances the nervous system and increases blood circulation, helping to flush out stress hormones. Even  a daily 20-minute walk makes a difference. Any kind of exercise can lower stress and improve your mood ? just pick activities that you enjoy and make it a regular habit. 3. Eat Well and Limit Alcohol and Stimulants Alcohol, nicotine  and caffeine may temporarily relieve stress but have negative health impacts and can make stress worse in the long run. Well-nourished bodies cope better, so start with a good breakfast, add more organic fruits and vegetables for a well-balanced diet, avoid processed foods and sugar, try herbal tea and drink more water . 4. Connect with Supportive People Talking face to face with another person releases hormones that reduce stress. Lean on those good listeners in your life. 5. Carve Out Hobby Time Do you enjoy gardening, reading, listening to music or some other creative pursuit? Engage in activities that bring you pleasure and joy; research shows that reduces stress by almost half and lowers your heart rate, too. 6. Practice Meditation, Stress Reduction or Yoga Relaxation techniques activate a state of restfulness that counterbalances your body's fight-or-flight hormones. Even if this also means a 10-minute break in a long day: listen to music, read, go for a walk in nature, do a hobby, take a bath or spend time with a friend. Also consider doing a mindfulness exercise or try a daily deep breathing or imagery practice. Deep Breathing Slow, calm and deep breathing can help you relax. Try these steps to focus on your breathing and repeat as needed. Find a comfortable position and close your eyes. Exhale and drop your shoulders. Breathe in through your nose; fill your lungs and then your belly. Think of relaxing your body, quieting your mind and becoming calm and peaceful. Breathe out slowly through your nose, relaxing your belly. Think of releasing tension, pain, worries or distress. Repeat steps three and four until you feel relaxed. Imagery This involves using your mind  to excite the senses -- sound, vision, smell, taste and feeling. This may help ease your stress. Begin by getting comfortable and then do some slow breathing. Imagine a place you love being at. It could be somewhere from your childhood, somewhere you vacationed or just a place in your imagination. Feel how it is to be in the place you're imagining. Pay attention to the sounds, air, colors, and who is there with you. This is a place where you feel cared for and loved. All is well. You are safe. Take in all the smells, sounds, tastes and feelings. As you do, feel your body being nourished and healed. Feel the calm that surrounds you. Breathe in all the good. Breathe out any discomfort or tension. 7. Sleep Enough If you get less than seven to eight hours of sleep, your body won't tolerate stress as well as it could. If stress keeps you up at night, address the cause, and add extra meditation into your day to make up for the lost z's. Try to get seven to nine hours of sleep each night. Make a regular bedtime schedule. Keep your room dark and cool. Try  to avoid computers, TV, cell phones and tablets before bed. 8. Bond with Connections You Enjoy Go out for a coffee with a friend, chat with a neighbor, call a family member, visit with a clergy member, or even hang out with your pet. Clinical studies show that spending even a short time with a companion animal can cut anxiety levels almost in half. 9. Take a Vacation Getting away from it all can reset your stress tolerance by increasing your mental and emotional outlook, which makes you a happier, more productive person upon return. Leave your cellphone and laptop at home! 10. See a Counselor, Coach or Therapist If negative thoughts overwhelm your ability to make positive changes, it's time to seek professional help. Make an appointment today--your health and life are worth it.    Lyle Rung, BSW, MSW, LCSW Licensed Clinical Social Worker [employer]    [email or web address] Direct Dial: [phone]

## 2023-04-18 ENCOUNTER — Telehealth: Payer: Self-pay

## 2023-04-18 NOTE — Telephone Encounter (Signed)
 Patient states she states she has been having a major headache. Patient states she can feel it on the top and back of he head. Patient wanted to know what should she take for the pain since she is on so many medications to be on the safe side.

## 2023-04-19 NOTE — Telephone Encounter (Signed)
 Tylenol  1000 mg (2 extra strength tablets), may take up to 3 times daily as needed. Make sure you are eating and drinking enough water . Please make sure you are taking blood pressure medications. I also recommend taking your blood pressure, if > 150/90 please let us  know.

## 2023-04-23 ENCOUNTER — Ambulatory Visit: Payer: Medicaid Other | Attending: Orthopedic Surgery | Admitting: Occupational Therapy

## 2023-04-24 ENCOUNTER — Encounter: Payer: Self-pay | Admitting: Neurology

## 2023-04-24 ENCOUNTER — Inpatient Hospital Stay: Payer: Self-pay | Admitting: Neurology

## 2023-04-25 ENCOUNTER — Other Ambulatory Visit: Payer: Medicaid Other

## 2023-04-25 DIAGNOSIS — E1169 Type 2 diabetes mellitus with other specified complication: Secondary | ICD-10-CM

## 2023-04-26 ENCOUNTER — Other Ambulatory Visit: Payer: Self-pay | Admitting: Orthopedic Surgery

## 2023-04-26 ENCOUNTER — Ambulatory Visit: Payer: Self-pay | Admitting: *Deleted

## 2023-04-26 ENCOUNTER — Other Ambulatory Visit: Payer: Self-pay

## 2023-04-26 DIAGNOSIS — I639 Cerebral infarction, unspecified: Secondary | ICD-10-CM

## 2023-04-26 DIAGNOSIS — E1169 Type 2 diabetes mellitus with other specified complication: Secondary | ICD-10-CM

## 2023-04-26 LAB — LIPID PANEL
Cholesterol: 157 mg/dL (ref <200)
HDL: 49 mg/dL — ABNORMAL LOW (ref >=50)
LDL Cholesterol (Calc): 92 mg/dL
Non-HDL Cholesterol (Calc): 108 mg/dL (ref <130)
Total CHOL/HDL Ratio: 3.2 (calc) (ref <5.0)
Triglycerides: 72 mg/dL (ref <150)

## 2023-04-26 MED ORDER — ATORVASTATIN CALCIUM 40 MG PO TABS
60.0000 mg | ORAL_TABLET | Freq: Every day | ORAL | 1 refills | Status: DC
Start: 2023-04-26 — End: 2023-09-02

## 2023-05-03 ENCOUNTER — Encounter: Payer: Self-pay | Admitting: Orthopedic Surgery

## 2023-05-03 ENCOUNTER — Telehealth: Payer: Self-pay

## 2023-05-03 NOTE — Telephone Encounter (Signed)
Patient states her nose has been running and and some congestion with some sneezing. Patient would like to know what medications can she take with all her other medications. She states she feel something coming on but she has her vaccines so she has not did any covid or flu testing

## 2023-05-03 NOTE — Telephone Encounter (Signed)
Zyrtec and flonase can help dry out nasal secretions. These medications can be purchased over the counter. Take both once daily. If you do not feel better over the weekend schedule appointment to be seen.

## 2023-05-05 ENCOUNTER — Other Ambulatory Visit: Payer: Self-pay

## 2023-05-05 ENCOUNTER — Encounter (HOSPITAL_BASED_OUTPATIENT_CLINIC_OR_DEPARTMENT_OTHER): Payer: Self-pay

## 2023-05-05 ENCOUNTER — Emergency Department (HOSPITAL_BASED_OUTPATIENT_CLINIC_OR_DEPARTMENT_OTHER)
Admission: EM | Admit: 2023-05-05 | Discharge: 2023-05-05 | Disposition: A | Discharge status: Home / Self Care | Payer: Medicaid Other | Attending: Emergency Medicine | Admitting: Emergency Medicine

## 2023-05-05 ENCOUNTER — Emergency Department (HOSPITAL_BASED_OUTPATIENT_CLINIC_OR_DEPARTMENT_OTHER): Payer: Medicaid Other

## 2023-05-05 DIAGNOSIS — J069 Acute upper respiratory infection, unspecified: Secondary | ICD-10-CM | POA: Diagnosis not present

## 2023-05-05 DIAGNOSIS — Z794 Long term (current) use of insulin: Secondary | ICD-10-CM | POA: Diagnosis not present

## 2023-05-05 DIAGNOSIS — Z7901 Long term (current) use of anticoagulants: Secondary | ICD-10-CM | POA: Diagnosis not present

## 2023-05-05 DIAGNOSIS — Z79899 Other long term (current) drug therapy: Secondary | ICD-10-CM | POA: Insufficient documentation

## 2023-05-05 DIAGNOSIS — E119 Type 2 diabetes mellitus without complications: Secondary | ICD-10-CM | POA: Diagnosis not present

## 2023-05-05 DIAGNOSIS — Z91040 Latex allergy status: Secondary | ICD-10-CM | POA: Diagnosis not present

## 2023-05-05 DIAGNOSIS — I10 Essential (primary) hypertension: Secondary | ICD-10-CM | POA: Insufficient documentation

## 2023-05-05 DIAGNOSIS — Z20822 Contact with and (suspected) exposure to covid-19: Secondary | ICD-10-CM | POA: Insufficient documentation

## 2023-05-05 DIAGNOSIS — R059 Cough, unspecified: Secondary | ICD-10-CM | POA: Diagnosis present

## 2023-05-05 LAB — RESP PANEL BY RT-PCR (RSV, FLU A&B, COVID)  RVPGX2
Influenza A by PCR: NEGATIVE
Influenza B by PCR: NEGATIVE
Resp Syncytial Virus by PCR: NEGATIVE
SARS Coronavirus 2 by RT PCR: NEGATIVE

## 2023-05-05 MED ORDER — DOXYCYCLINE HYCLATE 100 MG PO CAPS: 100.0000 mg | ORAL_CAPSULE | Freq: Two times a day (BID) | ORAL | 0 refills | Status: AC

## 2023-05-05 MED ORDER — IPRATROPIUM-ALBUTEROL 0.5-2.5 (3) MG/3ML IN SOLN
3.0000 mL | Freq: Once | RESPIRATORY_TRACT | Status: AC
  Administered 2023-05-05: 3 mL via RESPIRATORY_TRACT
  Filled 2023-05-05: qty 3

## 2023-05-05 MED ORDER — ALBUTEROL SULFATE HFA 108 (90 BASE) MCG/ACT IN AERS
2.0000 | INHALATION_SPRAY | Freq: Once | RESPIRATORY_TRACT | Status: AC
  Administered 2023-05-05: 2 via RESPIRATORY_TRACT
  Filled 2023-05-05: qty 6.7

## 2023-05-05 MED ORDER — PREDNISONE 20 MG PO TABS: 40.0000 mg | ORAL_TABLET | Freq: Every day | ORAL | 0 refills | Status: AC

## 2023-05-05 NOTE — ED Triage Notes (Addendum)
Pt presents to ED for nasal congestion x 4 days. States that she has been coughing and having drainage. States that the cough is getting worse. States that she is congested in her chest.

## 2023-05-05 NOTE — ED Provider Notes (Signed)
[place] [facility] Provider Note   CSN: [id number] Arrival date & time: 05/05/23  0740     History  Chief Complaint  Patient presents with   Nasal Congestion    Courtney Davies is a 48 y.o. female.  HPI 48 year old female history of prior V-fib cardiac arrest suspect likely placement, hypertension, type 2 diabetes, anoxic brain injury presenting for cough, congestion.  Patient states she has had congestion for a few days and developed cough yesterday.  Cough is slightly worse.  Nonproductive, no hemoptysis.  No fevers or chills.  No chest pain.  Occasional shortness of breath with her cough.  She is otherwise been at her baseline health.  She used to smoke and states she used diabetes inhaler intermittently when she smoked.  She has been having some wheezing.  She is on Eliquis.     Home Medications Prior to Admission medications   Medication Sig Start Date End Date Taking? Authorizing Provider  doxycycline (VIBRAMYCIN) 100 MG capsule Take 1 capsule (100 mg total) by mouth 2 (two) times daily for 7 days. 05/05/23 05/12/23 Yes Laurence Spates, MD  predniSONE (DELTASONE) 20 MG tablet Take 2 tablets (40 mg total) by mouth daily for 5 days. 05/05/23 05/10/23 Yes Laurence Spates, MD  acetaminophen (TYLENOL) 500 MG tablet Take 1 tablet (500 mg total) by mouth every 6 (six) hours as needed. 08/11/22   Redwine, Madison A, PA-C  albuterol (VENTOLIN HFA) 108 (90 Base) MCG/ACT inhaler Inhale 2 puffs into the lungs every 6 (six) hours as needed for wheezing or shortness of breath. 01/24/21   Love, Evlyn Kanner, PA-C  apixaban (ELIQUIS) 5 MG TABS tablet Take 1 tablet (5 mg total) by mouth 2 (two) times daily. 01/24/23   [name], [name], NP  atorvastatin (LIPITOR) 40 MG tablet Take 1.5 tablets (60 mg total) by mouth daily. 04/26/23   [name], [name], NP  BD PEN NEEDLE NANO 2ND GEN 32G X 4 MM MISC 3 (three) times daily. as directed 07/11/21   [provider]   carvedilol (COREG) 6.25 MG tablet TAKE 1 TABLET BY MOUTH TWICE DAILY 02/22/23   [name], NP  Continuous Glucose Receiver (DEXCOM G7 RECEIVER) DEVI Dispense 1 receiver 02/14/23   [provider]  Continuous Glucose Sensor (DEXCOM G7 SENSOR) MISC Use 1 sensor every 10 days. 02/14/23   [provider]  diclofenac Sodium (VOLTAREN) 1 % GEL Apply 4 g topically 4 (four) times daily. Apply to left knee 08/15/22   [name], Monina C, NP  gabapentin (NEURONTIN) 100 MG capsule Take 2 capsules (200 mg total) by mouth at bedtime. 03/21/23   [name], [name], NP  hydrOXYzine (VISTARIL) 25 MG capsule TAKE 1 CAPSULE(25 MG) BY MOUTH AT BEDTIME AS NEEDED 02/11/23   [name], [name], NP  Insulin Syringe-Needle U-100 (INSULIN SYRINGE .5CC/31GX5/16") 31G X 5/16" 0.5 ML MISC 3 (three) times daily. 07/12/21   [provider]  JARDIANCE 10 MG TABS tablet TAKE 1 TABLET(10 MG) BY MOUTH EVERY MORNING 03/08/23   [name], [name], NP  losartan (COZAAR) 25 MG tablet TAKE 1 TABLET BY MOUTH DAILY 01/29/23   [name], [name], NP  metformin (FORTAMET) 500 MG (OSM) 24 hr tablet Take 1,000 mg by mouth daily with breakfast.    [provider]  metroNIDAZOLE (METROGEL) 0.75 % vaginal gel Place 1 Applicatorful vaginally daily. For 5 days 03/21/23   [name], NP  ondansetron (ZOFRAN-ODT) 4 MG disintegrating  tablet Take 1 tablet (4 mg total) by mouth every 8 (eight) hours as needed for nausea or vomiting. 03/05/23   [name], MD  OZEMPIC, 0.25 OR 0.5 MG/DOSE, 2 MG/3ML SOPN Inject 0.5 mg into the skin once a week. 01/06/23   [provider]  QUEtiapine (SEROQUEL) 50 MG tablet Take 1 tablet (50 mg total) by mouth at bedtime. 03/06/23   [name], MD  rivastigmine (EXELON) 3 MG capsule TAKE 1 CAPSULE(3 MG) BY MOUTH TWICE DAILY 12/21/22   [name], MD  sertraline (ZOLOFT) 50 MG tablet Take 1 tablet (50 mg total) by mouth daily. 08/16/22   [name], Monina C, NP       Allergies    Latex, Other, Codeine, and Nitrofurantoin    Review of Systems   Review of Systems Review of systems completed and notable as per HPI.  ROS otherwise negative.   Physical Exam Updated Vital Signs BP (!) 140/97 (BP Location: Left Arm)   Pulse 83   Temp 98.3 F (36.8 C) (Tympanic)   Resp 18   Ht 5\' 5"  (1.651 m)   Wt 97.5 kg   LMP 12/03/2012   SpO2 99%   BMI 35.78 kg/m  Physical Exam Vitals and nursing note reviewed.  Constitutional:      General: She is not in acute distress.    Appearance: She is well-developed.  HENT:     Head: Normocephalic and atraumatic.     Nose: Nose normal.     Mouth/Throat:     Mouth: Mucous membranes are moist.     Pharynx: Oropharynx is clear.  Eyes:     Extraocular Movements: Extraocular movements intact.     Conjunctiva/sclera: Conjunctivae normal.     Pupils: Pupils are equal, round, and reactive to light.  Cardiovascular:     Rate and Rhythm: Normal rate and regular rhythm.     Pulses: Normal pulses.     Heart sounds: Normal heart sounds. No murmur heard. Pulmonary:     Effort: Pulmonary effort is normal. No respiratory distress.     Breath sounds: No stridor. Wheezing present. No rhonchi.  Abdominal:     Palpations: Abdomen is soft.     Tenderness: There is no abdominal tenderness.  Musculoskeletal:        General: No swelling.     Cervical back: Neck supple.     Right lower leg: No edema.     Left lower leg: No edema.  Skin:    General: Skin is warm and dry.     Capillary Refill: Capillary refill takes less than 2 seconds.  Neurological:     Mental Status: She is alert.  Psychiatric:        Mood and Affect: Mood normal.     ED Results / Procedures / Treatments   Labs (all labs ordered are listed, but only abnormal results are displayed) Labs Reviewed  RESP PANEL BY RT-PCR (RSV, FLU A&B, COVID)  [id number]    EKG EKG Interpretation Date/Time:  Sunday May 05 2023 09:28:54 EST Ventricular Rate:   63 PR Interval:  163 QRS Duration:  81 QT Interval:  414 QTC Calculation: 424 R Axis:   69  Text Interpretation: Sinus rhythm Anteroseptal infarct, age indeterminate No significant change since last tracing Confirmed by Fulton Reek [phone]) on 05/05/2023 9:34:12 AM  Radiology DG Chest 2 View Result Date: 05/05/2023 CLINICAL DATA:  Nasal congestion with cough 4 days. EXAM: CHEST - 2 VIEW COMPARISON:  09/16/2021 FINDINGS:  Defibrillator over the lower left chest wall unchanged. Lungs are adequately inflated and otherwise clear. Cardiomediastinal silhouette and remainder of the exam is unchanged. IMPRESSION: No active cardiopulmonary disease. Electronically Signed   By: Elberta Fortis M.D.   On: 05/05/2023 08:22    Procedures Procedures    Medications Ordered in ED Medications  albuterol (VENTOLIN HFA) 108 (90 Base) MCG/ACT inhaler 2 puff (has no administration in time range)  ipratropium-albuterol (DUONEB) 0.5-2.5 (3) MG/3ML nebulizer solution 3 mL (3 mLs Nebulization Given 05/05/23 2956)    ED Course/ Medical Decision Making/ A&P                                 Medical Decision Making Amount and/or Complexity of Data Reviewed Radiology: ordered.  Risk Prescription drug management.   Medical Decision Making:   ASLYNN ZOLTOWSKI is a 48 y.o. female who presented to the ED today with cough congestion, wheezing.  Vital signs reviewed.  Exam shows mild wheezing but normal work of breathing.  No focality on exam.  Chest x-ray reviewed, no signs of pneumonia no fever.  I suspect she has likely viral infection causing her wheezing.  She used to smoke and previously had to use an inhaler, wonder if she may have some mild undiagnosed COPD.  COVID flu are negative.  She is no chest pain of low concern for ACS or dissection.  She is on Eliquis and therapeutic with clear URI symptoms I will suspicion for PE.   Patient placed on continuous vitals and telemetry monitoring while in ED which was  reviewed periodically.  Reviewed and confirmed nursing documentation for past medical history, family history, social history.  Reassessment and Plan:   Wheezing and shortness of breath improved with DuoNeb.  Her diabetes is well-controlled sugar usually is about 110.  I think we can trial a course of steroids and doxycycline for bronchitis picture.  Recommend she call her PCP tomorrow to schedule follow-up as well.  Given albuterol inhaler here to use as needed for wheezing.  Patient is comfortable with this plan.  Strict return precautions given.   Patient's presentation is most consistent with acute complicated illness / injury requiring diagnostic workup.           Final Clinical Impression(s) / ED Diagnoses Final diagnoses:  Upper respiratory tract infection, unspecified type    Rx / DC Orders ED Discharge Orders          Ordered    doxycycline (VIBRAMYCIN) 100 MG capsule  2 times daily        05/05/23 0956    predniSONE (DELTASONE) 20 MG tablet  Daily        05/05/23 0956              Laurence Spates, MD 05/05/23 [phone]

## 2023-05-05 NOTE — Discharge Instructions (Addendum)
Your x-ray was reassuring and you did not have COVID or flu.  I suspect you have bronchitis and a viral infection.  You are being placed on a course of antibiotics and steroids.  If your blood sugar becomes consistent high you should stop the steroids.  You should call your doctor tomorrow to schedule follow-up.  If you develop chest pain, worsening difficulty breathing or any other new concerning symptoms you should return to the ED.  You can use the albuterol inhaler as needed to help with your wheezing.

## 2023-05-06 ENCOUNTER — Ambulatory Visit: Payer: Medicaid Other | Admitting: Adult Health

## 2023-05-06 ENCOUNTER — Encounter: Payer: Self-pay | Admitting: Adult Health

## 2023-05-06 VITALS — BP 131/78 | HR 68 | Temp 97.7°F | Resp 18 | Ht 66.0 in | Wt 215.6 lb

## 2023-05-06 DIAGNOSIS — J069 Acute upper respiratory infection, unspecified: Secondary | ICD-10-CM

## 2023-05-06 DIAGNOSIS — N898 Other specified noninflammatory disorders of vagina: Secondary | ICD-10-CM | POA: Diagnosis not present

## 2023-05-06 DIAGNOSIS — B9689 Other specified bacterial agents as the cause of diseases classified elsewhere: Secondary | ICD-10-CM

## 2023-05-06 LAB — POCT URINALYSIS DIPSTICK (MANUAL)
Leukocytes, UA: NEGATIVE
Nitrite, UA: NEGATIVE
Poct Bilirubin: NEGATIVE
Poct Glucose: 1000 mg/dL — AB
Poct Ketones: NEGATIVE
Poct Protein: NEGATIVE mg/dL
Poct Urobilinogen: NORMAL mg/dL
Spec Grav, UA: 1.01 (ref 1.010–1.025)
pH, UA: 6 (ref 5.0–8.0)

## 2023-05-06 MED ORDER — SACCHAROMYCES BOULARDII 250 MG PO CAPS: 250.0000 mg | ORAL_CAPSULE | Freq: Two times a day (BID) | ORAL | 0 refills | Status: AC

## 2023-05-06 MED ORDER — METRONIDAZOLE 0.75 % VA GEL: 1.0000 | Freq: Every day | VAGINAL | 0 refills | Status: AC

## 2023-05-06 NOTE — Progress Notes (Unsigned)
[facility] clinic  Provider: Avanell Shackleton [name]  Code Status:  Full Code  Goals of Care:     05/06/2023    1:52 PM  Advanced Directives  Does Patient Have a Medical Advance Directive? No  Would patient like information on creating a medical advance directive? No - Patient declined     Chief Complaint  Patient presents with   Acute Visit    Patient is still congestion and the medication is working.    HPI: Patient is a 48 y.o. female seen today for an acute visit for congestion, follow up ED visit on 05/05/23  Was seen in the ED yesterday, 05/05/23, due to nose running, coughing, having shortness of breath. She was diagnosed with upper respiratory infection. Negative for COVID-19 and Influenza A/B. She was discharged with Doxycycline and Prednisone.   She complained of her urine being cloudy, started last week, has diabetes and history yeast infection. She complains of itching in her vagina.  Past Medical History:  Diagnosis Date   Anemia 09/2015   Anoxic brain injury (HCC)    Arthritis    "knees" (04/23/2017)   Asthma    "teens; went away; came back" (04/23/2017)   B12 deficiency anemia 04/23/2017   Cardiac arrest (HCC)    Chronic bronchitis (HCC)    Chronic low back pain with sciatica    Chronic lower back pain    Diabetes mellitus without complication (HCC)    Diabetes type 2, controlled (HCC)    Elevated ferritin level    Fatty liver    GERD (gastroesophageal reflux disease)    GERD with stricture    Headache    "1-2/wk" (04/23/2017)   History of blood transfusion "plenty"   "related to anemia" (04/23/2017)   Hypertension    Inguinal hernia    Low back pain    Migraine    "1-2/month" (04/23/2017)   OA (osteoarthritis) of knee--left    Paroxysmal atrial fibrillation (HCC)    Sub-clinical -seen on device?, elevated CHADSVASC   Symptomatic anemia 04/23/2017   Vitamin B 12 deficiency     Past Surgical History:  Procedure Laterality Date   ABDOMINAL HYSTERECTOMY  YRS  AGO   COMPLETE   CESAREAN SECTION  1998; 2002   ESOPHAGEAL DILATION  02/2020   by Dr Lanae Boast   INGUINAL HERNIA REPAIR Bilateral 1980s    "total of 4 surgeries" (04/23/2017)   IR GASTROSTOMY TUBE MOD SED  12/13/2020   KNEE ARTHROSCOPY WITH MEDIAL MENISECTOMY Left 01/30/2018   Procedure: LEFT KNEE ARTHROSCOPY WITH PARTIAL LATERAL MENISCECTOMY;  Surgeon: Kathryne Hitch, MD;  Location: [facility];  Service: Orthopedics;  Laterality: Left;   KNEE CARTILAGE SURGERY Left    SUBQ ICD IMPLANT N/A 09/13/2021   Procedure: SUBQ ICD IMPLANT;  Surgeon: [name], MD;  Location: [facility] INVASIVE CV [facility];  Service: Cardiovascular;  Laterality: N/A;   TUBAL LIGATION  2002    Allergies  Allergen Reactions   Latex Hives   Other Other (See Comments), Anaphylaxis, Swelling and Hives    Hair glue causes throat to close and hives "glue" Hair glue causes throat to close    Codeine Nausea And Vomiting    Was on an empty stomach.   Nitrofurantoin Rash    Outpatient Encounter Medications as of 05/06/2023  Medication Sig   acetaminophen (TYLENOL) 500 MG tablet Take 1 tablet (500 mg total) by mouth every 6 (six) hours as needed.   albuterol (VENTOLIN HFA) 108 (90 Base) MCG/ACT inhaler Inhale  2 puffs into the lungs every 6 (six) hours as needed for wheezing or shortness of breath.   apixaban (ELIQUIS) 5 MG TABS tablet Take 1 tablet (5 mg total) by mouth 2 (two) times daily.   atorvastatin (LIPITOR) 40 MG tablet Take 1.5 tablets (60 mg total) by mouth daily.   BD PEN NEEDLE NANO 2ND GEN 32G X 4 MM MISC 3 (three) times daily. as directed   carvedilol (COREG) 6.25 MG tablet TAKE 1 TABLET BY MOUTH TWICE DAILY   Continuous Glucose Receiver (DEXCOM G7 RECEIVER) DEVI Dispense 1 receiver   Continuous Glucose Sensor (DEXCOM G7 SENSOR) MISC Use 1 sensor every 10 days.   diclofenac Sodium (VOLTAREN) 1 % GEL Apply 4 g topically 4 (four) times daily. Apply to left knee   doxycycline (VIBRAMYCIN) 100 MG capsule  Take 1 capsule (100 mg total) by mouth 2 (two) times daily for 7 days.   gabapentin (NEURONTIN) 100 MG capsule Take 2 capsules (200 mg total) by mouth at bedtime.   hydrOXYzine (VISTARIL) 25 MG capsule TAKE 1 CAPSULE(25 MG) BY MOUTH AT BEDTIME AS NEEDED   Insulin Syringe-Needle U-100 (INSULIN SYRINGE .5CC/31GX5/16") 31G X 5/16" 0.5 ML MISC 3 (three) times daily.   JARDIANCE 10 MG TABS tablet TAKE 1 TABLET(10 MG) BY MOUTH EVERY MORNING   losartan (COZAAR) 25 MG tablet TAKE 1 TABLET BY MOUTH DAILY   metformin (FORTAMET) 500 MG (OSM) 24 hr tablet Take 1,000 mg by mouth daily with breakfast.   metroNIDAZOLE (METROGEL) 0.75 % vaginal gel Place 1 Applicatorful vaginally daily. For 5 days   ondansetron (ZOFRAN-ODT) 4 MG disintegrating tablet Take 1 tablet (4 mg total) by mouth every 8 (eight) hours as needed for nausea or vomiting.   OZEMPIC, 0.25 OR 0.5 MG/DOSE, 2 MG/3ML SOPN Inject 0.5 mg into the skin once a week.   predniSONE (DELTASONE) 20 MG tablet Take 2 tablets (40 mg total) by mouth daily for 5 days.   QUEtiapine (SEROQUEL) 50 MG tablet Take 1 tablet (50 mg total) by mouth at bedtime.   rivastigmine (EXELON) 3 MG capsule TAKE 1 CAPSULE(3 MG) BY MOUTH TWICE DAILY   sertraline (ZOLOFT) 50 MG tablet Take 1 tablet (50 mg total) by mouth daily.   No facility-administered encounter medications on file as of 05/06/2023.    Review of Systems:  Review of Systems  Constitutional:  Negative for appetite change, chills, fatigue and fever.  HENT:  Negative for congestion, hearing loss, rhinorrhea and sore throat.   Eyes: Negative.   Respiratory:  Positive for wheezing. Negative for cough and shortness of breath.   Cardiovascular:  Negative for chest pain, palpitations and leg swelling.  Gastrointestinal:  Negative for abdominal pain, constipation, diarrhea, nausea and vomiting.  Genitourinary:  Positive for vaginal discharge. Negative for dysuria.  Musculoskeletal:  Negative for arthralgias, back pain  and myalgias.  Skin:  Negative for color change, rash and wound.  Neurological:  Negative for dizziness, weakness and headaches.  Psychiatric/Behavioral:  Negative for behavioral problems. The patient is not nervous/anxious.     Health Maintenance  Topic Date Due   Pneumococcal Vaccine 94-80 Years old (1 of 2 - PCV) Never done   Cervical Cancer Screening (HPV/Pap Cotest)  Never done   COVID-19 Vaccine (3 - Pfizer risk series) 09/20/2021   OPHTHALMOLOGY EXAM  05/24/2023   HEMOGLOBIN A1C  07/09/2023   Diabetic kidney evaluation - eGFR measurement  03/04/2024   Diabetic kidney evaluation - Urine ACR  03/20/2024   FOOT EXAM  03/20/2024   DTaP/Tdap/Td (2 - Td or Tdap) 11/06/2025   Colonoscopy  12/04/2025   INFLUENZA VACCINE  Completed   Hepatitis C Screening  Completed   HIV Screening  Completed   HPV VACCINES  Aged Out    Physical Exam: Vitals:   05/06/23 1352  BP: 131/78  Pulse: 68  Resp: 18  Temp: 97.7 F (36.5 C)  SpO2: 98%  Weight: 215 lb 9.6 oz (97.8 kg)  Height: 5\' 6"  (1.676 m)   Body mass index is 34.8 kg/m. Physical Exam Constitutional:      Appearance: She is obese.  HENT:     Head: Normocephalic and atraumatic.     Nose: Nose normal.     Mouth/Throat:     Mouth: Mucous membranes are moist.  Eyes:     Conjunctiva/sclera: Conjunctivae normal.  Cardiovascular:     Rate and Rhythm: Normal rate and regular rhythm.  Pulmonary:     Effort: Pulmonary effort is normal.     Breath sounds: Wheezing present.  Abdominal:     General: Bowel sounds are normal.     Palpations: Abdomen is soft.  Musculoskeletal:        General: Normal range of motion.     Cervical back: Normal range of motion.  Skin:    General: Skin is warm and dry.  Neurological:     General: No focal deficit present.     Mental Status: She is alert and oriented to person, place, and time.  Psychiatric:        Mood and Affect: Mood normal.        Behavior: Behavior normal.        Thought  Content: Thought content normal.        Judgment: Judgment normal.    Labs reviewed: Basic Metabolic Panel: Recent Labs    12/06/22 1052 01/08/23 1117 01/10/23 1115 03/05/23 1415  NA 136 138 137 139  K 4.1 3.5 4.0 4.0  CL 104 106 109 106  CO2 21 20* 19* 26  GLUCOSE 127 100* 104* 106*  BUN 11 10 13 13   CREATININE 0.91 0.76 0.83 0.70  CALCIUM 9.1 9.0 9.2 9.3  TSH 0.44  --   --   --    Liver Function Tests: Recent Labs    12/06/22 1052 01/08/23 1117  AST 14 22  ALT 10 19  ALKPHOS  --  97  BILITOT 0.2 0.4  PROT 7.1 7.0  ALBUMIN  --  3.6   Recent Labs    03/05/23 1415  LIPASE 29   No results for input(s): "AMMONIA" in the last 8760 hours. CBC: Recent Labs    12/06/22 1052 01/08/23 0928 01/10/23 1115 03/05/23 1415  WBC 5.9 5.7 5.2 6.4  NEUTROABS 2,755 2.3  --  2,938  HGB 13.5 13.9 14.9 13.6  HCT 44.4 44.3 47.5* 46.3*  MCV 73.3* 72.6* 71.8* 74.4*  PLT 263 227 282 298   Lipid Panel: Recent Labs    01/09/23 0454 04/25/23 0853  CHOL 159 157  HDL 38* 49*  LDLCALC 104* 92  TRIG 85 72  CHOLHDL 4.2 3.2   Lab Results  Component Value Date   HGBA1C 7.1 (H) 01/09/2023    Procedures since last visit: DG Chest 2 View Result Date: 05/05/2023 CLINICAL DATA:  Nasal congestion with cough 4 days. EXAM: CHEST - 2 VIEW COMPARISON:  09/16/2021 FINDINGS: Defibrillator over the lower left chest wall unchanged. Lungs are adequately inflated and otherwise clear. Cardiomediastinal silhouette and  remainder of the exam is unchanged. IMPRESSION: No active cardiopulmonary disease. Electronically Signed   By: Elberta Fortis M.D.   On: 05/05/2023 08:22    Assessment/Plan  1. Upper respiratory tract infection, unspecified type (Primary) -  continue Doxycycline and Prednisone - saccharomyces boulardii (FLORASTOR) 250 MG capsule; Take 1 capsule (250 mg total) by mouth 2 (two) times daily for 14 days.  Dispense: 28 capsule; Refill: 0  2. Vaginal discharge - POCT Urinalysis Dip  Manual showed negative nitrite, trace blood, negative leukocytes - Urine Culture - SureSwab Advanced Bacterial Vaginosis (BV), TMA - Culture, Fungus with Smear - metroNIDAZOLE (METROGEL) 0.75 % vaginal gel; Place 1 Applicatorful vaginally daily for 5 days. For 5 days  Dispense: 50 g; Refill: 0    Labs/tests ordered:  - POCT Urinalysis Dip Manual - Urine Culture - SureSwab Advanced Bacterial Vaginosis (BV), TMA - Culture, Fungus with Smear   Next appt:  06/13/2023

## 2023-05-06 NOTE — Telephone Encounter (Signed)
Called and spoke with patient. She is scheduled today for an acute visit

## 2023-05-07 LAB — URINE CULTURE
MICRO NUMBER:: 15978904
Result:: NO GROWTH
SPECIMEN QUALITY:: ADEQUATE

## 2023-05-08 NOTE — Progress Notes (Signed)
-    negative bacterial vaginosis, discontinue Metronidazole gel -  urine culture showed no growth, no UTI

## 2023-05-15 ENCOUNTER — Other Ambulatory Visit: Payer: Self-pay | Admitting: Licensed Clinical Social Worker

## 2023-05-15 NOTE — Patient Instructions (Signed)
Visit Information  Courtney Davies was given information about Medicaid Managed Care team care coordination services as a part of their [facility] Community Plan Medicaid benefit. Courtney Davies verbally consented to engagement with the [facility] Managed Care team.   If you are experiencing a medical emergency, please call 911 or report to your local emergency department or urgent care.   If you have a non-emergency medical problem during routine business hours, please contact your provider's office and ask to speak with a nurse.   For questions related to your [facility], please call: [phone] or visit the homepage here: [email or web address]  If you would like to schedule transportation through your [facility], please call the following number at least 2 days in advance of your appointment: [phone]   Rides for urgent appointments can also be made after hours by calling Member Services.  Call the Behavioral Health Crisis Line at (732)133-7387, at any time, 24 hours a day, 7 days a week. If you are in danger or need immediate medical attention call 911.  If you would like help to quit smoking, call 1-800-QUIT-NOW (424-810-6187) OR Espaol: 1-855-Djelo-Ya (1-027-253-6644) o para ms informacin haga clic aqu or Text READY to 034-742 to register via text  Following is a copy of your plan of care:  Care Plan : LCSW Plan of Care  Updates made by Courtney Bryant, LCSW since 05/15/2023 12:00 AM     Problem: Social and Functional Symptoms      Goal: Social and Functional Skills Optimized   Start Date: 03/29/2023  Note:    Timeframe:  Short-Range Goal Priority:  High Start Date:   03/29/23          Expected End Date:  ongoing                     Goal met as of 05/15/23. Patient has therapy appointment tomorrow and psychiatry appointment next week at [facility].   - keep 90  percent of scheduled appointments -consider counseling or psychiatry -consider bumping up your self-care  -consider creating a stronger support network   Why is this important?             Combating depression may take some time.            If you don't feel better right away, don't give up on your treatment plan.    Current barriers:   Chronic Mental Health needs related to symptoms of depression, stress and anxiety. Patient requires Support, Education, Resources, Referrals, Advocacy, and Care Coordination, in order to meet Unmet Mental Health Needs and to find a therapist and psychiatrist. Patient will implement clinical interventions discussed today to decrease symptoms of depression and increase knowledge and/or ability of: coping skills. Mental Health Concerns and Social Isolation Patient lacks knowledge of available community counseling agencies and resources.  Clinical Goal(s): verbalize understanding of plan for management of Anxiety, Depression, and Stress symptoms and demonstrate a reduction in symptoms. Patient will connect with a provider for ongoing mental health treatment, increase coping skills, healthy habits, self-management skills, and stress reduction        Patient Goals/Self-Care Activities: Take medications as prescribed   Attend all scheduled provider appointments Call pharmacy for medication refills 3-7 days in advance of running out of medications Perform all self care activities independently  Perform IADL's (shopping, preparing meals, housekeeping, managing finances) independently Call provider office for new concerns or questions Work with  the social worker to address care coordination needs and will continue to work with the clinical team to address health care and disease management related needs call 1-800-273-TALK (toll free, 24 hour hotline) If in a crisis, go to [facility] [address], [place] [phone]) call  911 if experiencing a Mental Health or Behavioral Health Crisis  Utilize healthy coping skills and supportive resources discussed Contact PCP with any questions or concerns Keep 90 percent of counseling appointments Call your insurance provider for more information about your Enhanced Benefits  Check out counseling resources provided  Begin personal counseling with LCSW, to reduce and manage symptoms of Depression and Stress, until well-established with mental health provider Accept all calls from representative with Behavioral Health Providers in an effort to establish ongoing mental health counseling and supportive services. Incorporate into daily practice - relaxation techniques, deep breathing exercises, and mindfulness meditation strategies. Talk about feelings with friends, family members, spiritual advisor, etc. Contact LCSW directly [phone]), if you have questions, need assistance, or if additional social work needs are identified between now and our next scheduled telephone outreach call. Call 988 for mental health hotline/crisis line if needed (24/7 available) Try techniques to reduce symptoms of anxiety/negative thinking (deep breathing, distraction, positive self talk, etc)  - develop a personal safety plan - develop a plan to deal with triggers like holidays, anniversaries - exercise at least 2 to 3 times per week - have a plan for how to handle bad days - journal feelings and what helps to feel better or worse - spend time or talk with others at least 2 to 3 times per week - watch for early signs of feeling worse - begin personal counseling - call and visit an old friend - check out volunteer opportunities - join a support group - laugh; watch a funny movie or comedian - learn and use visualization or guided imagery - perform a random act of kindness - practice relaxation or meditation daily - start or continue a personal journal - practice positive thinking and  self-talk -continue with compliance of taking medication  -identify current effective and ineffective coping strategies.  -implement positive self-talk in care to increase self-esteem, confidence and feelings of control.  -consider alternative and complementary therapy approaches such as meditation, mindfulness or yoga.  Call your insurance provider to gain education on benefits if desired Call your primary care doctor if symptoms get worse  -journaling, prayer, worship services, meditation or pastoral counseling.  -increase participation in pleasurable group activities such as hobbies, singing, sports or volunteering).  -consider the use of meditative movement therapy such as tai chi, yoga or qigong.  -start a regular daily exercise program based on tolerance, ability and patient choice to support positive thinking and activity    Follow Up Plan:  The patient has been provided with contact information for the care management team and has been advised to call with any mental health or health related questions or concerns.  The care management team will reach out to the patient again over the next 30 business  days.   If you are experiencing a Mental Health or Behavioral Health Crisis or need someone to talk to, please call the Suicide and Crisis Lifeline: 988      24- Hour Availability:    [facility]  [address] [place], [place] [phone] Crisis [phone]   [facility] [phone]  [facility] Crisis Service  [phone]    [facility] [employer]  [phone] (after hours)   Therapeutic Alternative/Mobile Crisis   [phone]   [place] National Suicide Hotline  (782)772-4956 Len Child[place]) [place] 440   Call (843)264-8838 for mental health emergencies   [facility]  [phone]);  [place] and [employer]  ([phone]); [place], [place], [place], [place], [place], [place], [place]     [place] [facility] for [facility] Residents For 24/7 walk-up access to mental health services for [facility] children (4+), adolescents and adults, please visit the [facility] located at [address] in [place], [place].  *[facility] also provides comprehensive outpatient behavioral health services in a variety of locations around the [place].  Connect With [place] [address] [place], [place] [address] HelpLine: (548)495-6794 or 1-(863) 270-8628  Get Directions  Find Help 24/7 By Phone Call our 24-hour HelpLine at [phone] or 5512704435 for immediate assistance for mental health and substance abuse issues.  Walk-In Help [place]: [facility] (Ages 4 and Up) [place]: Emergency Dept., [facility] Additional Resources National Hopeline Network: 1-800-SUICIDE The National Suicide Prevention Lifeline: 2-831-517-OHYW      Patient Goals: Follow up goal  Courtney Davies, BSW, MSW, LCSW Licensed Clinical Social Worker [employer]   [facility] [email or web address] Direct Dial: [phone]

## 2023-05-15 NOTE — Patient Outreach (Signed)
Medicaid Managed Care Social Work Note  05/15/2023 Name:  Courtney Davies MRN:  [id number] DOB:  [birth year]  Courtney Davies is an 48 y.o. year old female who is a primary patient of [name], [name], NP.  The Medicaid Managed Care Coordination team was consulted for assistance with:  Mental Health Counseling and Resources  [name] was given information about Medicaid Managed Care Coordination team services today. Courtney Davies Patient agreed to services and verbal consent obtained.  Engaged with patient  for by telephone forfollow up visit in response to referral for case management and/or care coordination services.   Patient is participating in a Managed Medicaid Plan:  Yes  Assessments/Interventions:  Review of past medical history, allergies, medications, health status, including review of consultants reports, laboratory and other test data, was performed as part of comprehensive evaluation and provision of chronic care management services.  SDOH: (Social Drivers of Health) assessments and interventions performed: SDOH Interventions    Flowsheet Row Patient Outreach Telephone from 05/15/2023 in [place] POPULATION HEALTH DEPARTMENT Patient Outreach Telephone from 04/16/2023 in [place] POPULATION HEALTH DEPARTMENT Patient Outreach Telephone from 03/29/2023 in [place] POPULATION HEALTH DEPARTMENT Telephone from 01/25/2023 in  POPULATION HEALTH DEPARTMENT Telephone from 01/14/2023 in [facility] Community Care Coordination ED to Hosp-Admission (Discharged) from 01/08/2023 in [place] [facility]  SDOH Interventions        Food Insecurity Interventions -- -- -- Other (Comment)  [name] refer patient to [name] for follow up] -- [id number] Referral, Inpatient [facility]  Transportation Interventions Payor Benefit Payor Benefit -- Other (Comment)  [referral to [employer] placed for [name] needs] Intervention Not Indicated  [pt states her mom will take her  to appts or she has Medicaid transportation-she was given info in hospital and has number to call] Inpatient TOC  Depression Interventions/Treatment  -- -- Medication, Referral to Psychiatry -- -- --  Stress Interventions Community Resources Provided, Provide Counseling  [Patient has upcoming psychiatry and counseling appointments at [facility] Provided, Provide Counseling -- -- --       Advanced Directives Status:  See Care Plan for related entries.  Care Plan                 Allergies  Allergen Reactions   Latex Hives   Other Other (See Comments), Anaphylaxis, Swelling and Hives    Hair glue causes throat to close and hives "glue" Hair glue causes throat to close    Codeine Nausea And Vomiting    Was on an empty stomach.   Nitrofurantoin Rash    Medications Reviewed Today     Reviewed by Gustavus Bryant, LCSW (Social Worker) on 05/15/23 at 1133  Med List Status: <None>   Medication Order Taking? Sig Documenting Provider Last Dose Status Informant  acetaminophen (TYLENOL) 500 MG tablet [id number] No Take 1 tablet (500 mg total) by mouth every 6 (six) hours as needed. Redwine, Madison A, PA-C Taking Active Self, Mother, Pharmacy Records  albuterol (VENTOLIN HFA) 108 (90 Base) MCG/ACT inhaler [id number] No Inhale 2 puffs into the lungs every 6 (six) hours as needed for wheezing or shortness of breath. [name], PA-C Taking Active Self, Mother, Pharmacy Records  apixaban (ELIQUIS) 5 MG TABS tablet [id number] No Take 1 tablet (5 mg total) by mouth 2 (two) times daily. [name], NP Taking Active            Med Note (DILLARD, JASMINE E  Tue Mar 05, 2023  1:11 PM)    atorvastatin (LIPITOR) 40 MG tablet [id number] No Take 1.5 tablets (60 mg total) by mouth daily. [name], NP Taking Active   BD PEN NEEDLE NANO 2ND GEN 32G X 4 MM MISC [id number] No 3 (three) times daily. as directed [provider] Taking Active Self, Mother, Pharmacy Records   carvedilol (COREG) 6.25 MG tablet [id number] No TAKE 1 TABLET BY MOUTH TWICE DAILY [name], [name], NP Taking Active   Continuous Glucose Receiver (DEXCOM G7 RECEIVER) Hardie Pulley [id number] No Dispense 1 receiver [provider] Taking Active   Continuous Glucose Sensor (DEXCOM G7 SENSOR) MISC [id number] No Use 1 sensor every 10 days. [provider] Taking Active   diclofenac Sodium (VOLTAREN) 1 % GEL [id number] No Apply 4 g topically 4 (four) times daily. Apply to left knee [name], Monina C, NP Taking Active Self, Mother, Pharmacy Records  gabapentin (NEURONTIN) 100 MG capsule [id number] No Take 2 capsules (200 mg total) by mouth at bedtime. [name], NP Taking Active   hydrOXYzine (VISTARIL) 25 MG capsule [id number] No TAKE 1 CAPSULE(25 MG) BY MOUTH AT BEDTIME AS NEEDED [name], [name], NP Taking Active   Insulin Syringe-Needle U-100 (INSULIN SYRINGE .5CC/31GX5/16") 31G X 5/16" 0.5 ML MISC [id number] No 3 (three) times daily. [provider] Taking Active Self, Mother, Pharmacy Records  JARDIANCE 10 MG TABS tablet [id number] No TAKE 1 TABLET(10 MG) BY MOUTH EVERY MORNING [name], [name], NP Taking Active   losartan (COZAAR) 25 MG tablet [id number] No TAKE 1 TABLET BY MOUTH DAILY [name], [name], NP Taking Active   metformin (FORTAMET) 500 MG (OSM) 24 hr tablet [id number] No Take 1,000 mg by mouth daily with breakfast. [provider] Taking Active Self, Mother, Pharmacy Records  ondansetron (ZOFRAN-ODT) 4 MG disintegrating tablet [id number] No Take 1 tablet (4 mg total) by mouth every 8 (eight) hours as needed for nausea or vomiting. [name], MD Taking Active   OZEMPIC, 0.25 OR 0.5 MG/DOSE, 2 MG/3ML SOPN [id number] No Inject 0.5 mg into the skin once a week. [provider] Taking Active Self, Mother, Pharmacy Records  QUEtiapine (SEROQUEL) 50 MG tablet [id number] No Take 1 tablet (50 mg total) by mouth at bedtime. [name], MD Taking Active    rivastigmine (EXELON) 3 MG capsule [id number] No TAKE 1 CAPSULE(3 MG) BY MOUTH TWICE DAILY [name], MD Taking Active Self, Mother, Pharmacy Records  saccharomyces boulardii (FLORASTOR) 250 MG capsule [id number]  Take 1 capsule (250 mg total) by mouth 2 (two) times daily for 14 days. [name], Monina C, NP  Active   sertraline (ZOLOFT) 50 MG tablet [id number] No Take 1 tablet (50 mg total) by mouth daily. 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   Cough 04/26/2021   Acute blood loss anemia    Controlled type 2 diabetes mellitus with hyperglycemia (HCC)    Essential hypertension    Anoxic brain injury (HCC) 01/02/2021   Fever 12/20/2020   Abdominal wall cellulitis 12/20/2020   Dysphagia    Goals of care, counseling/discussion    Acute systolic heart failure (HCC)    Encephalopathy acute  Type 1 diabetes mellitus without complication (HCC) 12/02/2020   Hypertension 12/02/2020   GERD (gastroesophageal reflux disease) 12/02/2020   Anemia 12/02/2020   Cardiac arrest (HCC) 12/01/2020   Unilateral primary osteoarthritis, left knee 12/01/2018   Lateral meniscus, posterior horn derangement, left 05/14/2018   Status post arthroscopy of left knee 05/14/2018   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/19/2017   Anemia 04/23/2017   Acute bronchitis 04/23/2017   Chronic back pain 10/03/2015   Symptomatic anemia 04/10/2014   Bilateral edema of lower extremity    Low back pain with left-sided sciatica     Care Plan : LCSW Plan of Care  Updates made by Gustavus Bryant, LCSW since 05/15/2023 12:00 AM     Problem: Social and Functional Symptoms      Goal: Social  and Functional Skills Optimized   Start Date: 03/29/2023  Note:    Timeframe:  Short-Range Goal Priority:  High Start Date:   03/29/23          Expected End Date:  ongoing                     Goal met as of 05/15/23. Patient has therapy appointment tomorrow and psychiatry [facility] next week [facility]   - keep 90 percent of scheduled appointments -consider counseling or psychiatry -consider bumping up your self-care  -consider creating a stronger support network   Why is this important?             Combating depression may take some time.            If you don't feel better right away, don't give up on your treatment plan.    Current barriers:   Chronic Mental Health needs related to symptoms of depression, stress and anxiety. Patient requires Support, Education, Resources, Referrals, Advocacy, and Care Coordination, in order to meet Unmet Mental Health Needs and to find a therapist and psychiatrist. Patient will implement clinical interventions discussed today to decrease symptoms of depression and increase knowledge and/or ability of: coping skills. Mental Health Concerns and Social Isolation Patient lacks knowledge of available community counseling agencies and resources.  Clinical Goal(s): verbalize understanding of plan for management of Anxiety, Depression, and Stress symptoms and demonstrate a reduction in symptoms. Patient will connect with a provider for ongoing mental health treatment, increase coping skills, healthy habits, self-management skills, and stress reduction        Clinical Interventions:  Assessed patient's previous and current treatment, coping skills, support system and barriers to care. Patient provided hx  Verbalization of feelings encouraged, motivational interviewing employed Emotional support provided, positive coping strategies explored. Establishing healthy boundaries emphasized and healthy self-care education provided Patient was educated on available  mental health resources within their area that accept Medicaid and offer counseling and psychiatry. Patient was advised to contact the back of her insurance card for assistance with benefits as well. Patient is agreeable to referral to [facility] for counseling and psychiatry. [facility] LCSW made referral on 03/29/23 with patient's permission. Patient educated on the difference between therapy and psychiatry per patient request. Email sent to patient today with available mental health resources within her area that accept Medicaid and offer the services that she is interested in. Email included instructions for scheduling at [facility] as well as some crisis support resources and [facility]'s walk in clinic hours. Patient was educated on the "988" crisis line as well.  Emotional support provided. CBT intervention implemented regarding "being mentally fit" by  combating negative thinking and replacing it with uplifting support, hope and positivity. Patient reports that her current medication for depression and anxiety are no longer working effectively for her.  Patient reports significant worsening anxiety impacting her ability to function appropriately and carry out daily task. Assessed social determinant of health barriers Patient endorsed need for transportation to medical appointments and running errands. LCSW discussed Medicaid and [facility].  Patient receives strong support from sister.  LCSW provided education on relaxation techniques such as meditation, deep breathing, massage, grounding exercises or yoga that can activate the body's relaxation response and ease symptoms of stress and anxiety. LCSW ask that when pt is struggling with difficult emotions and racing thoughts that they start this relaxation response process. LCSW provided extensive education on healthy coping skills for anxiety. SW used active and reflective listening, validated patient's feelings/concerns, and provided emotional support. Patient  will work on implementing appropriate self-care habits into their daily routine such as: staying positive, writing a gratitude list, drinking water, staying active around the house, taking their medications as prescribed, combating negative thoughts or emotions and staying connected with their family and friends. Positive reinforcement provided for this decision to work on this. LCSW provided education on healthy sleep hygiene and what that looks like. LCSW encouraged patient to implement a night time routine into their schedule that works best for them and that they are able to maintain. Advised patient to implement deep breathing/grounding/meditation/self-care exercises into their nightly routine to combat racing thoughts at night. LCSW encouraged patient to wake up at the same time each day, make their sleeping environment comfortable, exercise when able, to limit naps and to not eat or drink anything right before bed.  Motivational Interviewing employed Depression screen reviewed  PHQ2/ PHQ9 completed or reviewed  Mindfulness or Relaxation training provided Active listening / Reflection utilized  Advance Care and HCPOA education provided Emotional Support Provided Problem Solving /Task Center strategies reviewed Provided psychoeducation for mental health needs  Provided brief CBT  Reviewed mental health medications and discussed importance of compliance:  Quality of sleep assessed & Sleep Hygiene techniques promoted  Participation in counseling encouraged  Verbalization of feelings encouraged  Suicidal Ideation/Homicidal Ideation assessed: Patient denies SI/HI  Review resources, discussed options and provided patient information about  Mental Health Resources Inter-disciplinary care team collaboration (see longitudinal plan of care) Patient has been successfully scheduled for both psychiatry and counseling at [facility]. She reports being stable at this time and will utilize [facility] walk in clinic  IF needed before her initial [facility] appointments set for 05/16/23 at 05/22/23. 05/15/23 update- [facility] LCSW provided extensive education on her upcoming appointments at [facility] and how to use MyChart to log into her virtual visits. Patient was sent an email as well with [facility]'s contact information and she physically wrote their contact number down as well. Patient reports that she is having a hard time keeping up with her appointments but was very appreciative of support provided today. [facility] LCSW provided emotional support and reflective listening during call. [facility] LCSW will sign off at this time as there are no further clinical social work needs now that patient has been connected successfully with [facility] treatment.   Patient Goals/Self-Care Activities: Take medications as prescribed   Attend all scheduled provider appointments Call pharmacy for medication refills 3-7 days in advance of running out of medications Perform all self care activities independently  Perform IADL's (shopping, preparing meals, housekeeping, managing finances) independently Call provider office for new concerns  or questions Work with the Child psychotherapist to address care coordination needs and will continue to work with the clinical team to address health care and disease management related needs call 1-800-273-TALK (toll free, 24 hour hotline) If in a crisis, go to [facility] [address] [place] [phone]) call 911 if experiencing a Mental Health or Behavioral Health Crisis  Utilize healthy coping skills and supportive resources discussed Contact PCP with any questions or concerns Keep 90 percent of counseling appointments Call your insurance provider for more information about your Enhanced Benefits  Check out counseling resources provided  Begin personal counseling with LCSW, to reduce and manage symptoms of Depression and Stress, until well-established with mental health  provider Accept all calls from representative with Behavioral Health Providers in an effort to establish ongoing mental health counseling and supportive services. Incorporate into daily practice - relaxation techniques, deep breathing exercises, and mindfulness meditation strategies. Talk about feelings with friends, family members, spiritual advisor, etc. Contact LCSW directly [phone]), if you have questions, need assistance, or if additional social work needs are identified between now and our next scheduled telephone outreach call. Call 988 for mental health hotline/crisis line if needed (24/7 available) Try techniques to reduce symptoms of anxiety/negative thinking (deep breathing, distraction, positive self talk, etc)  - develop a personal safety plan - develop a plan to deal with triggers like holidays, anniversaries - exercise at least 2 to 3 times per week - have a plan for how to handle bad days - journal feelings and what helps to feel better or worse - spend time or talk with others at least 2 to 3 times per week - watch for early signs of feeling worse - begin personal counseling - call and visit an old friend - check out volunteer opportunities - join a support group - laugh; watch a funny movie or comedian - learn and use visualization or guided imagery - perform a random act of kindness - practice relaxation or meditation daily - start or continue a personal journal - practice positive thinking and self-talk -continue with compliance of taking medication  -identify current effective and ineffective coping strategies.  -implement positive self-talk in care to increase self-esteem, confidence and feelings of control.  -consider alternative and complementary therapy approaches such as meditation, mindfulness or yoga.  Call your insurance provider to gain education on benefits if desired Call your primary care doctor if symptoms get worse  -journaling, prayer, worship  services, meditation or pastoral counseling.  -increase participation in pleasurable group activities such as hobbies, singing, sports or volunteering).  -consider the use of meditative movement therapy such as tai chi, yoga or qigong.  -start a regular daily exercise program based on tolerance, ability and patient choice to support positive thinking and activity    Follow Up Plan:  The patient has been provided with contact information for the care management team and has been advised to call with any mental health or health related questions or concerns.  The care management team will reach out to the patient again over the next 30 business  days.   If you are experiencing a Mental Health or Behavioral Health Crisis or need someone to talk to, please call the Suicide and Crisis Lifeline: 988    Patient Goals: Follow up goal       Follow up:  Patient requests no follow-up at this time.  Plan: The Managed Medicaid care management team is available to follow up with  the patient after provider conversation with the patient regarding recommendation for care management engagement and subsequent re-referral to the care management team.   Dickie La, BSW, MSW, LCSW Licensed Clinical Social Worker [place]   [facility] [email or web address] Direct Dial: [phone]

## 2023-05-16 ENCOUNTER — Ambulatory Visit (INDEPENDENT_AMBULATORY_CARE_PROVIDER_SITE_OTHER): Payer: Medicaid Other | Admitting: Clinical

## 2023-05-16 DIAGNOSIS — F39 Unspecified mood [affective] disorder: Secondary | ICD-10-CM

## 2023-05-16 LAB — FUNGUS CULTURE W SMEAR
MICRO NUMBER:: 15980828
SPECIMEN QUALITY:: ADEQUATE

## 2023-05-16 LAB — SURESWAB® ADVANCED BACTERIAL VAGINOSIS (BV), TMA: SURESWAB(R) ADV BACTERIAL VAGINOSIS(BV),TMA: NEGATIVE

## 2023-05-16 NOTE — Progress Notes (Signed)
Courtney Davies is a 48 y.o. female patient presented at the time of the appointment. Client reported she is currently at work and does not get off until 2:30p. Therapist was able to confirm a rescheduled date for feb 6 @3p  via video. Client was in agreement. Client reported no urgent concerns at this time.     Collaboration of Care: Other therapist sent the client rescheduled date to admin staff.  Patient/Guardian was advised Release of Information must be obtained prior to any record release in order to collaborate their care with an outside provider. Patient/Guardian was advised if they have not already done so to contact the registration department to sign all necessary forms in order for [place] to release information regarding their care.   Consent: Patient/Guardian gives verbal consent for treatment and assignment of benefits for services provided during this visit. Patient/Guardian expressed understanding and agreed to proceed.    Courtney Rhymes [name], LCSW

## 2023-05-21 NOTE — Progress Notes (Signed)
 Psychiatric Initial Adult Assessment  Patient Identification: Courtney Davies MRN:  [id number] Date of Evaluation:  05/22/2023 Referral Source: Greig Cluster, NP  Assessment:  Courtney Davies is a 48 y.o. female with a history of depression, HTN, T2DM, V-fib arrest and anoxic brain injury requiring extensive hospitalization in 2022 s/p ICD, mild asthma, OSA, Vitamin B12 deficiency, and chronic lower back pain who presents to [facility] via video conferencing for initial evaluation of depression.  Patient denies psychiatric history prior to cardiac arrest in 2022 resulting in anoxic brain injury and neurocognitive sequelae in form of forgetfulness, impaired problem solving, irritability and emotion dysregulation. Patient shows insight into her deficits which lends to increasing frustration with impairments and decline in functioning. She is currently working and performs ADLs independently although requires assistance with iADLs (mom sets medications out in pill box; sons manage finances). She reports current symptoms of depression including low mood most days, impaired appetite, anhedonia, and hopelessness with passive SI. She reports 1 suicide attempt about a year ago however mom intervened and she did not seek out psychiatric intervention. She denies active SI since that time. Patient has never participated in formal psychotherapy and would benefit from increased support as she navigates change to independence and identity. Discussed increase in Zoloft  and this writer attempted to call patient's mother to review; voicemail was left requesting call back however will send in updated prescription to pharmacy and medication change outlined in AVS.  RTC in approx. 2 months in person.  Plan:  # MDD Past medication trials: Celexa  (mood and agitation during 2022 hospitalization), Depakote  (mood and agitation during 2022 hospitalization) Status of problem: new problem to this  provider Interventions: -- INCREASE Zoloft  to 100 mg daily (i2/5/25)  -- Multiple attempts were made to reach out to patient's mother who manages medications however unable to connect; will send in new medication and encouraged patient that she or mother can call clinic with questions -- Prescribed Seroquel  50 mg nightly by PM&R -- Prescribed Atarax  25 mg nightly as needed by PCP -- Prescribed gabapentin  200 mg nightly by PCP -- Patient scheduled for initial psychotherapy appointment with Paige Cozart, LCSW 05/23/2023  # Neurocognitive impairment 2/2 anoxic brain injury in 2022 # Poor emotion regulation Past medication trials:  Status of problem: chronic Interventions: -- Patient prescribed rivastigmine  3 mg BID by PM&R -- SLUMS performed 01/10/23 score of 7/30  # Cannabis use disorder Status of problem: chronic Interventions: -- Continue to monitor and promote reduction/ultimately cessation  Patient was given contact information for behavioral health clinic and was instructed to call 911 for emergencies.   Subjective:  Chief Complaint:  Chief Complaint  Patient presents with   Medication Management   New Patient (Initial Visit)    History of Present Illness:    Chart review: --Referred by PCP December 2024 for depression/anxiety. -- Home psychotropics: Seroquel  50 mg nightly Zoloft  50 mg daily Rivastigmine  3 mg BID Atarax  25 mg nightly as needed Gabapentin  200 mg nightly -- SLP evaluation 01/10/23:  Pt reports history of cognitive impairment. She lives with her sons, who handle all finances and her mother provides assistance with medication management. She previously scored 6/30 on the SLUMS in 2022. Today, she scored 7/30 on the SLUMS (a score of 27 or above is considered WFL) characterized by deficits related to problem solving, divergent naming, memory, attention, and awareness. Pt with phonemic paraphasias during a sentence repetition task. She appears to have awareness of  deficits, although lacks  awareness of their impact on ADLs.    Patient is seen by video. She states she is not the same person she was since going into cardiac arrest. Reports she is more forgetful, more easily agitated and can go 0 to 100 in 2 seconds. Typically set off by comments from others. When set off, will start yelling. Denies physical aggression or destruction to property. Mood at baseline is always down with frequent tearfulness. Endorses intact energy and motivation. Continues to work but  work has been difficult due to memory issues. Gets irritated by coworkers who talk about her brain injury. When not working, spends day mostly at home. Hard to find things she enjoys. Prior to cardiac arrest, she was much busier and spent time with friends. States she feels hopeless most days with passive SI. Denies ever experiencing this prior to cardiac arrest. Reports 1 suicide attempt after cardiac arrest but denies active SI for a while - identifies faith and 2 sons as protective factors.  Reports sleeping well with at least 7-8 hours nightly. Falling and staying asleep well with use of melatonin. Appetite has been low due to mood symptoms.   Denies history of psychiatric diagnoses predating cardiac arrest. Denies AVH; paranoia; grandiosity. Denies history of hypomania/mania. Doesn't remember any of the hospitalization surrounding cardiac arrest; denies flashbacks to hospitalization.   Lives with 2 sons; mom lives nearby. Mom fills her pill box for the week and she is able to take her medications twice daily; denies missed doses and sets an alarm to remember. She is not aware of the names of the medications she is taking or what they are for. Sons manage finances.    Endorses smoking weed throughout the day including on lunch breaks at work. Reports cannabis use predating cardiac arrest but smoking more since that time. Feels weed is the only thing that is keeping [me] here in that it is an  escape for her. She identifies she needs to cut back because her doctors told her to but is not able to identify internal motivations to decrease use. Agrees cannabis may be worsening attention and memory.   Diagnostic conceptualization discussed. Discussed option to increase Zoloft  given ongoing depressive symptoms however patient reports she has nothing to do with her medications and requests this writer speak to her mother. She is amenable to starting psychotherapy for additional support.   Attempted to call patient's mother Mady Oubre x2 without answer; left HIPAA compliant voicemail with callback number.  Past Psychiatric History:  Diagnoses: none predating cardiac arrest in 2022; subsequently diagnosed with neurocognitive deficits, MDD Medication trials: Celexa  (mood and agitation during 2022 hospitalization), Depakote  (mood and agitation during 2022 hospitalization) Previous psychiatrist/therapist: denies Hospitalizations: denies Suicide attempts: superficial cut to wrists in 2024 but mom intervened Hx of violence towards others: denies Current access to guns: denies Hx of trauma/abuse: denies; does not have memory of hospitalizatin  Previous Psychotropic Medications: Yes   Substance Abuse History in the last 12 months:  Yes.    -- Etoh: a few times a year; 3 drinks in a sitting  -- Cannabis: all throughout the day; about 4 blunts per day and more on the weekend  -- Tobacco: denies  -- Denies use of other illicit drugs including stimulants, benzodiazepines, opioids, hallucinogens  Past Medical History:  Past Medical History:  Diagnosis Date   Anemia 09/2015   Anoxic brain injury (HCC)    Arthritis    knees (04/23/2017)   Asthma    teens; went  away; came back (04/23/2017)   B12 deficiency anemia 04/23/2017   Cardiac arrest (HCC)    Chronic bronchitis (HCC)    Chronic low back pain with sciatica    Chronic lower back pain    Diabetes mellitus without complication (HCC)     Diabetes type 2, controlled (HCC)    Elevated ferritin level    Fatty liver    GERD (gastroesophageal reflux disease)    GERD with stricture    Headache    1-2/wk (04/23/2017)   History of blood transfusion plenty   related to anemia (04/23/2017)   Hypertension    Inguinal hernia    Low back pain    Migraine    1-2/month (04/23/2017)   OA (osteoarthritis) of knee--left    Paroxysmal atrial fibrillation (HCC)    Sub-clinical -seen on device?, elevated CHADSVASC   Symptomatic anemia 04/23/2017   Vitamin B 12 deficiency     Past Surgical History:  Procedure Laterality Date   ABDOMINAL HYSTERECTOMY  YRS AGO   COMPLETE   CESAREAN SECTION  1998; 2002   ESOPHAGEAL DILATION  02/2020   by Dr Marvis   INGUINAL HERNIA REPAIR Bilateral 1980s    total of 4 surgeries (04/23/2017)   IR GASTROSTOMY TUBE MOD SED  12/13/2020   KNEE ARTHROSCOPY WITH MEDIAL MENISECTOMY Left 01/30/2018   Procedure: LEFT KNEE ARTHROSCOPY WITH PARTIAL LATERAL MENISCECTOMY;  Surgeon: Vernetta Lonni GRADE, MD;  Location: [facility];  Service: Orthopedics;  Laterality: Left;   KNEE CARTILAGE SURGERY Left    SUBQ ICD IMPLANT N/A 09/13/2021   Procedure: SUBQ ICD IMPLANT;  Surgeon: [name], MD;  Location: [facility] INVASIVE CV LAB;  Service: Cardiovascular;  Laterality: N/A;   TUBAL LIGATION  2002    Family Psychiatric History: denies  Family History:  Family History  Problem Relation Age of Onset   Stroke Mother    Diabetes Mother    Diabetes Father    Stroke Maternal Grandmother    Diabetes Maternal Grandmother    Anemia Paternal Grandmother    Valvular heart disease Paternal Grandmother    Hypertension Mother    Prostate cancer Maternal Uncle        ? intestinal also    Social History:   Academic/Vocational: works as copy at [employer]  Social History   Socioeconomic History   Marital status: Single    Spouse name: Not on file   Number of children: 2   Years of education:  11th   Highest education level: Not on file  Occupational History   Occupation: scientist, product/process development in [employer]  Tobacco Use   Smoking status: Former    Types: Cigarettes   Smokeless tobacco: Never  Vaping Use   Vaping status: Never Used  Substance and Sexual Activity   Alcohol use: Yes    Comment: occ   Drug use: Yes    Types: Marijuana    Comment: daily   Sexual activity: Not Currently    Birth control/protection: Surgical  Other Topics Concern   Not on file  Social History Narrative   ** Merged History Encounter **       Lives at home with her two children. Occasional caffeine use. Right-handed.   Social Drivers of Corporate Investment Banker Strain: Not on file  Food Insecurity: Food Insecurity Present (01/29/2023)   Hunger Vital Sign    Worried About Running Out of Food in the Last Year: Often true    Ran Out of Food in  the Last Year: Often true  Transportation Needs: Unmet Transportation Needs (05/15/2023)   PRAPARE - Transportation    Lack of Transportation (Medical): Yes    Lack of Transportation (Non-Medical): Yes  Physical Activity: Not on file  Stress: Stress Concern Present (05/15/2023)   [employer] of Occupational Health - Occupational Stress Questionnaire    Feeling of Stress : To some extent  Social Connections: Not on file    Additional Social History: updated  Allergies:   Allergies  Allergen Reactions   Latex Hives   Other Other (See Comments), Anaphylaxis, Swelling and Hives    Hair glue causes throat to close and hives glue Hair glue causes throat to close    Codeine  Nausea And Vomiting    Was on an empty stomach.   Nitrofurantoin Rash    Current Medications: Current Outpatient Medications  Medication Sig Dispense Refill   acetaminophen  (TYLENOL ) 500 MG tablet Take 1 tablet (500 mg total) by mouth every 6 (six) hours as needed. 30 tablet 0   albuterol  (VENTOLIN  HFA) 108 (90 Base) MCG/ACT inhaler Inhale 2 puffs into the lungs every  6 (six) hours as needed for wheezing or shortness of breath. 18 g 2   apixaban  (ELIQUIS ) 5 MG TABS tablet Take 1 tablet (5 mg total) by mouth 2 (two) times daily. 60 tablet 5   atorvastatin  (LIPITOR) 40 MG tablet Take 1.5 tablets (60 mg total) by mouth daily. 90 tablet 1   BD PEN NEEDLE NANO 2ND GEN 32G X 4 MM MISC 3 (three) times daily. as directed     carvedilol  (COREG ) 6.25 MG tablet TAKE 1 TABLET BY MOUTH TWICE DAILY 180 tablet 1   Continuous Glucose Receiver (DEXCOM G7 RECEIVER) DEVI Dispense 1 receiver     Continuous Glucose Sensor (DEXCOM G7 SENSOR) MISC Use 1 sensor every 10 days.     diclofenac  Sodium (VOLTAREN ) 1 % GEL Apply 4 g topically 4 (four) times daily. Apply to left knee 350 g 3   gabapentin  (NEURONTIN ) 100 MG capsule Take 2 capsules (200 mg total) by mouth at bedtime. 120 capsule 3   hydrOXYzine  (VISTARIL ) 25 MG capsule TAKE 1 CAPSULE(25 MG) BY MOUTH AT BEDTIME AS NEEDED 30 capsule 5   Insulin  Syringe-Needle U-100 (INSULIN  SYRINGE .5CC/31GX5/16) 31G X 5/16 0.5 ML MISC 3 (three) times daily.     JARDIANCE  10 MG TABS tablet TAKE 1 TABLET(10 MG) BY MOUTH EVERY MORNING 90 tablet 1   losartan  (COZAAR ) 25 MG tablet TAKE 1 TABLET BY MOUTH DAILY 90 tablet 3   metformin  (FORTAMET ) 500 MG (OSM) 24 hr tablet Take 1,000 mg by mouth daily with breakfast.     ondansetron  (ZOFRAN -ODT) 4 MG disintegrating tablet Take 1 tablet (4 mg total) by mouth every 8 (eight) hours as needed for nausea or vomiting. 20 tablet 0   OZEMPIC, 0.25 OR 0.5 MG/DOSE, 2 MG/3ML SOPN Inject 0.5 mg into the skin once a week.     QUEtiapine  (SEROQUEL ) 50 MG tablet Take 1 tablet (50 mg total) by mouth at bedtime. 30 tablet 3   rivastigmine  (EXELON ) 3 MG capsule TAKE 1 CAPSULE(3 MG) BY MOUTH TWICE DAILY 60 capsule 6   sertraline  (ZOLOFT ) 100 MG tablet Take 1 tablet (100 mg total) by mouth daily. 30 tablet 2   No current facility-administered medications for this visit.    ROS: Review of  Systems  Objective:  Psychiatric Specialty Exam: Last menstrual period 12/03/2012.There is no height or weight on file to calculate BMI.  General Appearance: Casual and Fairly Groomed  Eye Contact:  Good  Speech:  Clear and Coherent and Normal Rate  Volume:  Normal  Mood:   low, irritable  Affect:   Sad; tearful; calm - able to brighten  Thought Content:  Denies AVH; no overt delusional thought content on interview    Suicidal Thoughts:   Endorses passive SI; denies active SI  Homicidal Thoughts:  No  Thought Process:  Overall linear/logical however brief and concrete at times  Orientation:  Oriented to person, DOB, year, season, DOW. Disoriented to month, date.    Memory: Impairments in short term memory  Judgment:  Fair  Insight:  Fair  Concentration:  Concentration: Impaired  although able to easily list DOWB  Recall:  On short term recall able to recall 1/3 words  Fund of Knowledge: Fair  Language: Good  Psychomotor Activity:  Normal  Akathisia:  No  AIMS (if indicated): not done  Assets:  Communication Skills Desire for Improvement Housing Social Support Transportation Vocational/Educational  ADL's:  Intact; requires assistance with iADLs  Cognition: noted impairment - on chart review score of 7/30 on SLUMS testing 01/10/23  Sleep:  Good   PE: General: sits comfortably in view of camera; no acute distress  Pulm: no increased work of breathing on room air  MSK: all extremity movements appear intact  Neuro: no focal neurological deficits observed  Gait & Station: unable to assess by video    Metabolic Disorder Labs: Lab Results  Component Value Date   HGBA1C 7.1 (H) 01/09/2023   MPG 157.07 01/09/2023   MPG 163 10/17/2022   No results found for: PROLACTIN Lab Results  Component Value Date   CHOL 157 04/25/2023   TRIG 72 04/25/2023   HDL 49 (L) 04/25/2023   CHOLHDL 3.2 04/25/2023   VLDL 17 01/09/2023   LDLCALC 92 04/25/2023   LDLCALC 104 (H) 01/09/2023    Lab Results  Component Value Date   TSH 0.44 12/06/2022    Therapeutic Level Labs: No results found for: LITHIUM No results found for: CBMZ Lab Results  Component Value Date   VALPROATE 51 01/19/2021    Screenings:  Mini-Mental    Flowsheet Row Office Visit from 03/01/2022 in [facility]  Total Score (max 30 points ) 10      PHQ2-9    Flowsheet Row Office Visit from 05/06/2023 in [facility] Patient Outreach Telephone from 03/29/2023 in [facility]PART[facility] Office Visit from 03/21/2023 in [facility] & Adult Medicine Office Visit from 03/06/2023 in [facility] Physical Medicine and Rehabilitation Office Visit from 12/06/2022 in [facility] Adult Medicine  PHQ-2 Total Score 0 2 0 0 1  PHQ-9 Total Score -- 9 -- -- --      Flowsheet Row ED from 05/05/2023 in [facility] Emergency Department at [facility] ED to Hosp-Admission (Discharged) from 01/08/2023 in [facility] from 08/11/2022 in [facility] Emergency Department at [facility]  C-SSRS RISK CATEGORY No Risk No Risk No Risk       Collaboration of Care: Collaboration of Care: Medication Management AEB active medication management, Psychiatrist AEB established with this provider, and Referral or follow-up with counselor/therapist AEB scheduled for individual psychotherapy  Patient/Guardian was advised Release of Information must be obtained prior to any record release in order to collaborate their care with an  outside provider. Patient/Guardian was advised if they have not already done so to contact the registration department to sign all necessary forms in order for us  to release information regarding their care.   Consent: Patient/Guardian gives verbal consent for treatment and assignment of benefits for services provided during this visit.  Patient/Guardian expressed understanding and agreed to proceed.   Televisit via video: I connected with Lennette [name] Moats on 05/22/23 at 10:00 AM EST by a video enabled telemedicine application and verified that I am speaking with the correct person using two identifiers.  Location: Patient: home address in [place] Provider: Remote office in [place]   I discussed the limitations of evaluation and management by telemedicine and the availability of in person appointments. The patient expressed understanding and agreed to proceed.  I discussed the assessment and treatment plan with the patient. The patient was provided an opportunity to ask questions and all were answered. The patient agreed with the plan and demonstrated an understanding of the instructions.   The patient was advised to call back or seek an in-person evaluation if the symptoms worsen or if the condition fails to improve as anticipated.  I provided 80 minutes dedicated to the care of this patient via video on the date of this encounter to include chart review, face-to-face time with the patient, medication management/counseling.  Calisha Tindel A Redonna Wilbert 2/5/20254:31 PM

## 2023-05-22 ENCOUNTER — Ambulatory Visit (HOSPITAL_COMMUNITY): Payer: Medicare Other | Admitting: Psychiatry

## 2023-05-22 ENCOUNTER — Encounter (HOSPITAL_COMMUNITY): Payer: Self-pay | Admitting: Psychiatry

## 2023-05-22 DIAGNOSIS — F1290 Cannabis use, unspecified, uncomplicated: Secondary | ICD-10-CM | POA: Insufficient documentation

## 2023-05-22 DIAGNOSIS — F329 Major depressive disorder, single episode, unspecified: Secondary | ICD-10-CM

## 2023-05-22 DIAGNOSIS — F321 Major depressive disorder, single episode, moderate: Secondary | ICD-10-CM

## 2023-05-22 DIAGNOSIS — R29818 Other symptoms and signs involving the nervous system: Secondary | ICD-10-CM

## 2023-05-22 DIAGNOSIS — R4189 Other symptoms and signs involving cognitive functions and awareness: Secondary | ICD-10-CM

## 2023-05-22 MED ORDER — SERTRALINE HCL 100 MG PO TABS: 100.0000 mg | ORAL_TABLET | Freq: Every day | ORAL | 2 refills | Status: DC

## 2023-05-22 NOTE — Patient Instructions (Signed)
 Thank you for attending your appointment today.  -- INCREASE Zoloft  to 100 mg daily -- Continue other medications as prescribed.  Please do not make any changes to medications without first discussing with your provider. If you are experiencing a psychiatric emergency, please call 911 or present to your nearest emergency department. Additional crisis, medication management, and therapy resources are included below.  [facility]  [address], [place], [place] [address] [phone] WALK-IN URGENT CARE 24/7 FOR ANYONE [address] [place], [place]  [phone] Fax: [phone] [email or web address] *Interpreters available *Accepts all insurance and uninsured for Urgent Care needs *Accepts Medicaid and uninsured for outpatient treatment (below)      ONLY FOR [facility]  Below:    Outpatient New Patient Assessment/Therapy Walk-ins:        Monday, Wednesday, and Thursday 8am until slots are full (first come, first served)                   New Patient Psychiatry/Medication Management        Monday-Friday 8am-11am (first come, first served)               For all walk-ins we ask that you arrive by 7:15am, because patients will be seen in the order of arrival.

## 2023-05-23 ENCOUNTER — Ambulatory Visit (INDEPENDENT_AMBULATORY_CARE_PROVIDER_SITE_OTHER): Payer: Medicare Other | Admitting: Clinical

## 2023-05-23 DIAGNOSIS — F321 Major depressive disorder, single episode, moderate: Secondary | ICD-10-CM

## 2023-05-23 DIAGNOSIS — F1290 Cannabis use, unspecified, uncomplicated: Secondary | ICD-10-CM

## 2023-05-26 NOTE — Progress Notes (Signed)
 Comprehensive Clinical Assessment (CCA) Note  05/23/2023 Courtney Davies [id number]  Virtual Visit via Video Note  I connected with Courtney Davies on 05/23/2023 at  3:00 PM EST by a video enabled telemedicine application and verified that I am speaking with the correct person using two identifiers.  Location: Patient: home Provider: office   I discussed the limitations of evaluation and management by telemedicine and the availability of in person appointments. The patient expressed understanding and agreed to proceed.   Follow Up Instructions: I discussed the assessment and treatment plan with the patient. The patient was provided an opportunity to ask questions and all were answered. The patient agreed with the plan and demonstrated an understanding of the instructions.   The patient was advised to call back or seek an in-person evaluation if the symptoms worsen or if the condition fails to improve as anticipated.  I provided 25 minutes of non-face-to-face time during this encounter.   Keiffer Piper Y Braelin Brosch, LCSW   Chief Complaint:  Chief Complaint  Patient presents with   Anxiety   Depression   Visit Diagnosis:   Mdd, single episode, moderate Cannabis use disorder  Interpretive Summary: Client is a 48 year old female presenting to the [facility] health center to establish with an outpatient therapist. Client is recently followed by a [facility] psychiatrist for the treatment of major depressive disorder. Client reported her cognitive changed following going into cardiac arrest in 2022. Client reported oxygen did not go to her brain for a period of time. Client reported she has been depressed and has no one to talk to. Client reported her mood is horrible. Client reported her family does not invite her to events anymore. Client reported she is close with her children. Client reported her other is also a support. Client reported she wants to work on being a better person.  Client reported prior to the health scare she was social and a nice person. Client reported she has no prior mental health history. Client reported use of marijuana daily. Client presented oriented times five, appropriately dressed and friendly. Client denied hallucinations, delusions, suicidal and homicidal ideations. Client was screened for pain, nutrition, [place] suicide severity and the following SDOH:    05/23/2023    3:18 PM  GAD 7 : Generalized Anxiety Score  Nervous, Anxious, on Edge 3  Control/stop worrying 3  Worry too much - different things 3  Trouble relaxing 3  Restless 3  Easily annoyed or irritable 3  Afraid - awful might happen 2  Total GAD 7 Score 20  Anxiety Difficulty Very difficult     Flowsheet Row Counselor from 05/23/2023 in [facility]  PHQ-9 Total Score 14        Treatment Recommendations: counseling and medication manegment    CCA Biopsychosocial Intake/Chief Complaint:  client reported she is referred by herself. client reported she has a history of depression and anxiety since going into cardiac arrest in 2022.  Current Symptoms/Problems: client reported mood swings, depressed mood, irritability, and insomnia  Patient Reported Schizophrenia/Schizoaffective Diagnosis in Past: No  Strengths: client is voluntarily engaging in services  Preferences: counseling and medication manegement  Abilities: vocal about problems and needs  Type of Services Patient Feels are Needed: psychiary and individual counseling  Initial Clinical Notes/Concerns: No data recorded  Mental Health Symptoms Depression:  Change in energy/activity   Duration of Depressive symptoms: Greater than two weeks   Mania:  None   Anxiety:   Irritability  Psychosis:  None   Duration of Psychotic symptoms: No data recorded  Trauma:  None   Obsessions:  None   Compulsions:  None   Inattention:  None   Hyperactivity/Impulsivity:  None    Oppositional/Defiant Behaviors:  None   Emotional Irregularity:  None   Other Mood/Personality Symptoms:  No data recorded   Mental Status Exam Appearance and self-care  Stature:  Average   Weight:  Average weight   Clothing:  Casual   Grooming:  Normal   Cosmetic use:  Age appropriate   Posture/gait:  Normal   Motor activity:  Not Remarkable   Sensorium  Attention:  Normal   Concentration:  Normal   Orientation:  X5   Recall/memory:  Normal   Affect and Mood  Affect:  Congruent   Mood:  Irritable   Relating  Eye contact:  Normal   Facial expression:  Responsive   Attitude toward examiner:  Cooperative   Thought and Language  Speech flow: Clear and Coherent   Thought content:  Appropriate to Mood and Circumstances   Preoccupation:  None   Hallucinations:  None   Organization:  No data recorded  [employer] of Knowledge:  Good   Intelligence:  Average   Abstraction:  Normal   Judgement:  Good   Reality Testing:  Adequate   Insight:  Good   Decision Making:  Normal   Social Functioning  Social Maturity:  Responsible   Social Judgement:  Normal   Stress  Stressors:  Family conflict; Illness   Coping Ability:  Overwhelmed   Skill Deficits:  Activities of daily living; Communication   Supports:  Family     Religion: Religion/Spirituality Are You A Religious Person?: No  Leisure/Recreation: Leisure / Recreation Do You Have Hobbies?: No  Exercise/Diet: Exercise/Diet Do You Exercise?: No Have You Gained or Lost A Significant Amount of Weight in the Past Six Months?: No Do You Follow a Special Diet?: No Do You Have Any Trouble Sleeping?: No   CCA Employment/Education Employment/Work Situation: Employment / Work Situation Employment Situation: Employed 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 client reported from what she can remember she had a good childhood. Does patient have siblings?: Yes Number of Siblings: 3 Description of patient's current relationship with siblings: client reported she has 2 sisters and 1 brother. client reported she does not have a relationship with them currently. Did patient suffer any verbal/emotional/physical/sexual abuse as a child?: No Did patient suffer from severe childhood neglect?: No Has patient ever been sexually abused/assaulted/raped as an adolescent or adult?: No Was the patient ever a victim of a crime or a disaster?: No Witnessed domestic violence?: No Has patient been affected by domestic violence as an adult?: No  Child/Adolescent Assessment:     CCA Substance Use Alcohol/Drug Use: Alcohol / Drug Use History of alcohol / drug use?: Yes Substance #1 Name of Substance 1: marijuana 1 - Frequency: daily  ASAM's:  Six Dimensions of Multidimensional Assessment  Dimension 1:  Acute Intoxication and/or Withdrawal Potential:      Dimension 2:  Biomedical Conditions and Complications:      Dimension 3:  Emotional, Behavioral, or Cognitive Conditions and Complications:     Dimension 4:  Readiness to Change:     Dimension 5:  Relapse, Continued use, or Continued Problem Potential:     Dimension 6:  Recovery/Living Environment:     ASAM Severity Score:    ASAM Recommended Level of Treatment:     Substance use Disorder (SUD)    Recommendations for  Services/Supports/Treatments:    DSM5 Diagnoses: Patient Active Problem List   Diagnosis Date Noted   Neurocognitive deficits 05/22/2023   Cannabis use disorder 05/22/2023   Stroke (HCC) 01/08/2023   Mild intermittent asthma without complication 02/09/2022   Vitamin B12 deficiency 02/09/2022   MDD (major depressive disorder), single episode, moderate (HCC) 11/08/2021   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   Cough 04/26/2021   Acute blood loss anemia    Controlled type 2 diabetes mellitus with hyperglycemia (HCC)    Essential hypertension    Anoxic brain injury (HCC) 01/02/2021   Fever 12/20/2020   Abdominal wall cellulitis 12/20/2020   Dysphagia    Goals of care, counseling/discussion    Acute systolic heart failure (HCC)    Encephalopathy acute    Type 1 diabetes mellitus without complication (HCC) 12/02/2020   Hypertension 12/02/2020   GERD (gastroesophageal reflux disease) 12/02/2020   Anemia 12/02/2020   Cardiac arrest (HCC) 12/01/2020   Unilateral primary osteoarthritis, left knee 12/01/2018   Lateral meniscus, posterior horn derangement, left 05/14/2018   Status post arthroscopy of left knee 05/14/2018   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/19/2017   Anemia 04/23/2017   Acute bronchitis 04/23/2017   Chronic back pain 10/03/2015   Symptomatic anemia 04/10/2014   Bilateral edema of lower extremity    Low back pain with left-sided sciatica     Patient Centered Plan: Patient is on the following Treatment Plan(s):  Depression   Referrals to Alternative Service(s): Referred to Alternative Service(s):   Place:   Date:   Time:    Referred to Alternative Service(s):   Place:   Date:   Time:    Referred to Alternative Service(s):   Place:   Date:   Time:    Referred to Alternative Service(s):   Place:   Date:   Time:      Collaboration of Care: Referral or follow-up with counselor/therapist [facility]  Patient/Guardian was advised Release  of Information must be obtained prior to any record release in order to collaborate their care with an outside provider. Patient/Guardian was advised if they have not already done so to contact the registration department to sign all necessary forms in order for us  to release information regarding their care.   Consent: Patient/Guardian gives verbal consent for treatment and assignment of benefits for services provided during this visit. Patient/Guardian expressed understanding and agreed to proceed.   Kenneth Lax Y Birl Lobello, LCSW

## 2023-05-31 ENCOUNTER — Other Ambulatory Visit: Payer: Self-pay | Admitting: Physical Medicine & Rehabilitation

## 2023-05-31 DIAGNOSIS — F321 Major depressive disorder, single episode, moderate: Secondary | ICD-10-CM

## 2023-06-05 ENCOUNTER — Encounter: Payer: Self-pay | Admitting: Physical Medicine & Rehabilitation

## 2023-06-05 ENCOUNTER — Encounter: Payer: Medicaid Other | Attending: Physical Medicine & Rehabilitation | Admitting: Physical Medicine & Rehabilitation

## 2023-06-05 ENCOUNTER — Ambulatory Visit: Payer: Medicaid Other | Admitting: Physical Medicine & Rehabilitation

## 2023-06-05 ENCOUNTER — Encounter: Payer: Medicaid Other | Admitting: Physical Medicine & Rehabilitation

## 2023-06-05 VITALS — BP 108/71 | HR 61 | Ht 65.5 in | Wt 215.0 lb

## 2023-06-05 DIAGNOSIS — G931 Anoxic brain damage, not elsewhere classified: Secondary | ICD-10-CM | POA: Diagnosis present

## 2023-06-05 DIAGNOSIS — R29818 Other symptoms and signs involving the nervous system: Secondary | ICD-10-CM | POA: Insufficient documentation

## 2023-06-05 DIAGNOSIS — G479 Sleep disorder, unspecified: Secondary | ICD-10-CM | POA: Diagnosis present

## 2023-06-05 DIAGNOSIS — R4189 Other symptoms and signs involving cognitive functions and awareness: Secondary | ICD-10-CM | POA: Insufficient documentation

## 2023-06-05 MED ORDER — QUETIAPINE FUMARATE 100 MG PO TABS: 100.0000 mg | ORAL_TABLET | Freq: Every day | ORAL | 3 refills | Status: DC

## 2023-06-05 NOTE — Progress Notes (Signed)
 Subjective:    Patient ID: Courtney Davies, female    DOB: 05/21/1975, 48 y.o.   MRN: [id number]  HPI  Courtney Davies is here in follow up of her anoxic BI. She has had better sleep with seroquel 50mg  nightly. She tries to go to bed around 7-8pm on work nights.  On the weekends she tries to go to bed around 10 or 11:00.  In addition to the Seroquel she is taking 24 mg of melatonin.  She does feel that she is a bit sleepy in the mornings.  She still has some difficulty falling asleep initially.   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 She has 2 breaks per day including lunch.  She reports that she is able to perform all the responsibilities of her job.  She says she has not had any negative feedback.  Patient admits to doing better when she is working by herself and tends to get a bit more excited when she is working around other people.  She would like to get into another feel to work however.  She continues to struggle with her left knee.  She has been followed by orthopedics for this.  She told me today that she wanted someone to "take it off.".  Not sure when her next follow-up is with them.  Pain Inventory Average Pain 10 Pain Right Now 10 My pain is burning and tingling  In the last 24 hours, has pain interfered with the following? General activity 10 Relation with others 0 Enjoyment of life 10 What TIME of day is your pain at its worst? morning , daytime, evening, and night Sleep (in general) Fair  Pain is worse with: walking, sitting, and standing Pain improves with:  nothing Relief from Meds:  .  Family History  Problem Relation Age of Onset   Stroke Mother    Diabetes Mother    Diabetes Father    Stroke Maternal Grandmother    Diabetes Maternal Grandmother    Anemia Paternal Grandmother    Valvular heart disease Paternal Grandmother    Hypertension Mother    Prostate cancer Maternal Uncle        ? intestinal also   Social History    Socioeconomic History   Marital status: Single    Spouse name: Not on file   Number of children: 2   Years of education: 11th   Highest education level: Not on file  Occupational History   Occupation: salad maker in restaurant  Tobacco Use   Smoking status: Former    Types: Cigarettes   Smokeless tobacco: Never  Vaping Use   Vaping status: Never Used  Substance and Sexual Activity   Alcohol use: Yes    Comment: occ   Drug use: Yes    Types: Marijuana    Comment: daily   Sexual activity: Not Currently    Birth control/protection: Surgical  Other Topics Concern   Not on file  Social History Narrative   ** Merged History Encounter **       Lives at home with her two children. Occasional caffeine use. Right-handed.   Social Drivers of Corporate investment banker Strain: Not on file  Food Insecurity: Food Insecurity Present (01/29/2023)   Hunger Vital Sign    Worried About Running Out of Food in the Last Year: Often true    Ran Out of Food in the Last Year: Often true  Transportation Needs: Unmet Transportation Needs (05/15/2023)  PRAPARE - Administrator, Civil Service (Medical): Yes    Lack of Transportation (Non-Medical): Yes  Physical Activity: Not on file  Stress: Stress Concern Present (05/15/2023)   [employer] of Occupational Health - Occupational Stress Questionnaire    Feeling of Stress : To some extent  Social Connections: Not on file   Past Surgical History:  Procedure Laterality Date   ABDOMINAL HYSTERECTOMY  YRS AGO   COMPLETE   CESAREAN SECTION  1998; 2002   ESOPHAGEAL DILATION  02/2020   by Dr Lillia Dallas HERNIA REPAIR Bilateral 1980s    "total of 4 surgeries" (04/23/2017)   IR GASTROSTOMY TUBE MOD SED  12/13/2020   KNEE ARTHROSCOPY WITH MEDIAL MENISECTOMY Left 01/30/2018   Procedure: LEFT KNEE ARTHROSCOPY WITH PARTIAL LATERAL MENISCECTOMY;  Surgeon: Kathryne Hitch, MD;  Location: [facility];  Service: Orthopedics;   Laterality: Left;   KNEE CARTILAGE SURGERY Left    SUBQ ICD IMPLANT N/A 09/13/2021   Procedure: SUBQ ICD IMPLANT;  Surgeon: [name], MD;  Location: [facility] INVASIVE CV LAB;  Service: Cardiovascular;  Laterality: N/A;   TUBAL LIGATION  2002   Past Surgical History:  Procedure Laterality Date   ABDOMINAL HYSTERECTOMY  YRS AGO   COMPLETE   CESAREAN SECTION  1998; 2002   ESOPHAGEAL DILATION  02/2020   by Dr Lillia Dallas HERNIA REPAIR Bilateral 1980s    "total of 4 surgeries" (04/23/2017)   IR GASTROSTOMY TUBE MOD SED  12/13/2020   KNEE ARTHROSCOPY WITH MEDIAL MENISECTOMY Left 01/30/2018   Procedure: LEFT KNEE ARTHROSCOPY WITH PARTIAL LATERAL MENISCECTOMY;  Surgeon: Kathryne Hitch, MD;  Location: [facility];  Service: Orthopedics;  Laterality: Left;   KNEE CARTILAGE SURGERY Left    SUBQ ICD IMPLANT N/A 09/13/2021   Procedure: SUBQ ICD IMPLANT;  Surgeon: [name], MD;  Location: [facility] INVASIVE CV LAB;  Service: Cardiovascular;  Laterality: N/A;   TUBAL LIGATION  2002   Past Medical History:  Diagnosis Date   Anemia 09/2015   Anoxic brain injury (HCC)    Arthritis    "knees" (04/23/2017)   Asthma    "teens; went away; came back" (04/23/2017)   B12 deficiency anemia 04/23/2017   Cardiac arrest (HCC)    Chronic bronchitis (HCC)    Chronic low back pain with sciatica    Chronic lower back pain    Diabetes mellitus without complication (HCC)    Diabetes type 2, controlled (HCC)    Elevated ferritin level    Fatty liver    GERD (gastroesophageal reflux disease)    GERD with stricture    Headache    "1-2/wk" (04/23/2017)   History of blood transfusion "plenty"   "related to anemia" (04/23/2017)   Hypertension    Inguinal hernia    Low back pain    Migraine    "1-2/month" (04/23/2017)   OA (osteoarthritis) of knee--left    Paroxysmal atrial fibrillation (HCC)    Sub-clinical -seen on device?, elevated CHADSVASC   Symptomatic anemia 04/23/2017   Vitamin B 12  deficiency    BP 108/71   Pulse 61   Ht 5' 5.5" (1.664 m)   Wt 215 lb (97.5 kg)   LMP 12/03/2012   SpO2 96%   BMI 35.23 kg/m   Opioid Risk Score:   Fall Risk Score:  `1  Depression screen [facility] 2/9     05/23/2023    3:19 PM 05/06/2023    1:51 PM 03/29/2023  3:55 PM 03/21/2023   12:42 PM 03/06/2023   11:14 AM 12/06/2022   10:41 AM 08/15/2022    1:33 PM  Depression screen PHQ 2/9  Decreased Interest  0 1 0 0 1 2  Down, Depressed, Hopeless  0 1 0 0 0 1  PHQ - 2 Score  0 2 0 0 1 3  Altered sleeping   2    3  Tired, decreased energy   2    3  Change in appetite   0    0  Feeling bad or failure about yourself    2    1  Trouble concentrating   1    1  Moving slowly or fidgety/restless   0    0  Suicidal thoughts   0    1  PHQ-9 Score   9    12  Difficult doing work/chores   Somewhat difficult         Information is confidential and restricted. Go to Review Flowsheets to unlock data.     Review of Systems  Musculoskeletal:        Left knee pain  Neurological:  Positive for weakness.  All other systems reviewed and are negative.     Objective:   Physical Exam General: No acute distress HEENT: NCAT, EOMI, oral membranes moist Cards: reg rate  Chest: normal effort Abdomen: Soft, NT, ND Skin: dry, intact Extremities: no edema Psych: pleasant and appropriate  Skin: intact Neuro: Patient is alert and oriented with time.  Cranial nerve exam is unremarkable.  can be a little distracted. ,improved short term memory   fair  insight. Iproved awareness. Motor 5/5. Sensory normal.  Musculoskeletal: antalgic LLE   Assessment & Plan:  1.  Decline in ADL, mobility , and cognition secondary to anoxic brain injury -Discussed the fact that her job is putting increased strain on her brain and the fact that she is not sleeping well is not helping either to cause her to feel tired and irritable during the day.  I expressed that the goal for sleep should be about 8 hours in her  circumstance.  iN honesty had be happy with 6 hours plus. -I'm ok with her driving -ok to continue exelon 3mg  bid 2. Left knee pain: mgt per ortho             voltaren gel prn.  4. Mood/sleep:               -increase seroquel to 50mg  at night for sleep/mood stability             -May take melatonin 10 mg with the Seroquel if needed but she should try the Seroquel alone initially.  I like her to avoid the over-the-counter sleep aids. -continue zoloft for mood.   -church involvement 5. T2DM per primary?: 6. HTN: 11. SVT/NSVT: On coreg and amiodarone bid for rate control.               -s/p ICD/pacer              -continue per cardiology.    20 minutes of face to face patient care time were spent during this visit. All questions were encouraged and answered.  Follow up with me in 3 mos. I encouraged her to call me if any issues prior to her follow-up visit.

## 2023-06-05 NOTE — Patient Instructions (Addendum)
 ALWAYS FEEL FREE TO CALL OUR OFFICE WITH ANY PROBLEMS OR QUESTIONS [phone])  **PLEASE NOTE** ALL MEDICATION REFILL REQUESTS (INCLUDING CONTROLLED SUBSTANCES) NEED TO BE MADE AT LEAST 7 DAYS PRIOR TO REFILL BEING DUE. ANY REFILL REQUESTS INSIDE THAT TIME FRAME MAY RESULT IN DELAYS IN RECEIVING YOUR PRESCRIPTION.     WE ARE INCREASING YOUR SEROQUEL (SLEEP MEDICINE) TO 100MG  BEFORE BED. IF IT TURNS OUT IT IS TOO MUCH FOR YOU, THEN CUT IT IN HALF.

## 2023-06-13 ENCOUNTER — Ambulatory Visit (INDEPENDENT_AMBULATORY_CARE_PROVIDER_SITE_OTHER): Payer: Medicare Other | Admitting: Orthopedic Surgery

## 2023-06-13 ENCOUNTER — Encounter: Payer: Self-pay | Admitting: Orthopedic Surgery

## 2023-06-13 VITALS — BP 110/70 | HR 65 | Temp 98.0°F | Resp 20 | Ht 65.5 in | Wt 219.0 lb

## 2023-06-13 DIAGNOSIS — R200 Anesthesia of skin: Secondary | ICD-10-CM

## 2023-06-13 DIAGNOSIS — J4520 Mild intermittent asthma, uncomplicated: Secondary | ICD-10-CM

## 2023-06-13 DIAGNOSIS — I10 Essential (primary) hypertension: Secondary | ICD-10-CM | POA: Diagnosis not present

## 2023-06-13 DIAGNOSIS — K2101 Gastro-esophageal reflux disease with esophagitis, with bleeding: Secondary | ICD-10-CM

## 2023-06-13 DIAGNOSIS — Z23 Encounter for immunization: Secondary | ICD-10-CM | POA: Diagnosis not present

## 2023-06-13 DIAGNOSIS — E1165 Type 2 diabetes mellitus with hyperglycemia: Secondary | ICD-10-CM | POA: Diagnosis not present

## 2023-06-13 DIAGNOSIS — G931 Anoxic brain damage, not elsewhere classified: Secondary | ICD-10-CM

## 2023-06-13 DIAGNOSIS — Z95810 Presence of automatic (implantable) cardiac defibrillator: Secondary | ICD-10-CM

## 2023-06-13 DIAGNOSIS — G629 Polyneuropathy, unspecified: Secondary | ICD-10-CM

## 2023-06-13 MED ORDER — GABAPENTIN 100 MG PO CAPS: 200.0000 mg | ORAL_CAPSULE | Freq: Two times a day (BID) | ORAL | 3 refills | Status: DC

## 2023-06-13 NOTE — Patient Instructions (Addendum)
 Look at [employer] to purchase glasses   Look into [facility] on [address] for eyeglasses  Schedule with Dr. Adah Perl your gynecologist for yearly women's visit> [phone]  Sweat bumps on chest> similar to acne> try acne face wash to chest  Increase gabapentin to 2 capsules twice daily for numbness

## 2023-06-13 NOTE — Progress Notes (Unsigned)
 Careteam: Patient Care Team: [name], NP as PCP - General (Adult Health Nurse Practitioner) [name], MD as PCP - Cardiology (Cardiology) [name], MD as PCP - Electrophysiology (Cardiology) [place], [place] (Family Medicine) Marcos Eke, RN as Registered Nurse Shaune Leeks as VBCI Care Management Heidi Dach, RN as VBCI Care Management  Seen by: Hazle Nordmann, AGNP-C  PLACE OF SERVICE:  [facility]  Advanced Directive information Does Patient Have a Medical Advance Directive?: No, Would patient like information on creating a medical advance directive?: No - Patient declined  Allergies  Allergen Reactions   Latex Hives   Other Other (See Comments), Anaphylaxis, Swelling and Hives    Hair glue causes throat to close and hives "glue" Hair glue causes throat to close    Codeine Nausea And Vomiting    Was on an empty stomach.   Nitrofurantoin Rash    Chief Complaint  Patient presents with   Medical Management of Chronic Issues    6 month follow up.   Immunizations    Discuss the need for Covid Booster, and Pne vaccine.   Health Maintenance    Discuss the need for Eye exam, AWV, and Pap smear.     HPI: Patient is a 48 y.o. female seen today for medical management of chronic conditions.   Discussed the use of AI scribe software for clinical note transcription with the patient, who gave verbal consent to proceed.  Recent A1c 7.3. Followed by endocrinology. She is experiencing nausea and vomiting attributed to an increased dose of Ozempic, prescribed for weight management. The nausea intensifies when she does not eat, leading to discomfort from stomach acid. She has not attempted any over-the-counter remedies for relief. She is using zofram prn. Diabetic eye exam 06/13/2023> Dr. Sondra Barges no retinopathy.   She has persistent numbness and tingling in her feet and fingers, described as 'numb' and 'tingly.' She is taking  gabapentin, recently increased to two capsules at bedtime, but continues to experience significant symptoms during the day. The numbness is 'irritating' and affects her daily life. Neuro consult scheduled in future.   Followed by psychiatry. She has a history of depression associated with current health conditions and notes feeling 'a little depressed again.' She is currently taking Zoloft 100 mg at night. She reports improved overall mood and sleep.   OBGYN Dr. Karren Burly Phillips> discussed needs for PAP.   ICD placed 09/13/2021 due to cardiac arrest, appears routine checks not always transmitting. 6 month follow up recommended but patient did not go. Recommend cardiology follow up. Admits to intermittent chest discomfort with movement.   Followed by Dr. Faith Rogue due to anoxic brain injury.  At this time, she is able to work. Performs ADLs on own. Continues to be overwhelmed with numerous doctors visits and medications. Mother assists with medication management and appointments.    Review of Systems:  Review of Systems  Constitutional: Negative.   HENT: Negative.    Eyes:  Positive for blurred vision.  Respiratory:  Negative for cough, shortness of breath and wheezing.   Cardiovascular:  Positive for chest pain. Negative for orthopnea and leg swelling.  Gastrointestinal:  Positive for abdominal pain and nausea. Negative for blood in stool, constipation, diarrhea, heartburn and vomiting.  Genitourinary:  Negative for dysuria.  Musculoskeletal:  Negative for falls and joint pain.  Skin: Negative.   Neurological:  Positive for tingling and sensory change. Negative for dizziness, weakness and headaches.  Psychiatric/Behavioral:  Positive for depression and memory loss. Negative for suicidal ideas. The patient has insomnia. The patient is not nervous/anxious.     Past Medical History:  Diagnosis Date   Anemia 09/2015   Anoxic brain injury (HCC)    Arthritis    "knees" (04/23/2017)    Asthma    "teens; went away; came back" (04/23/2017)   B12 deficiency anemia 04/23/2017   Cardiac arrest (HCC)    Chronic bronchitis (HCC)    Chronic low back pain with sciatica    Chronic lower back pain    Diabetes mellitus without complication (HCC)    Diabetes type 2, controlled (HCC)    Elevated ferritin level    Fatty liver    GERD (gastroesophageal reflux disease)    GERD with stricture    Headache    "1-2/wk" (04/23/2017)   History of blood transfusion "plenty"   "related to anemia" (04/23/2017)   Hypertension    Inguinal hernia    Low back pain    Migraine    "1-2/month" (04/23/2017)   OA (osteoarthritis) of knee--left    Paroxysmal atrial fibrillation (HCC)    Sub-clinical -seen on device?, elevated CHADSVASC   Symptomatic anemia 04/23/2017   Vitamin B 12 deficiency    Past Surgical History:  Procedure Laterality Date   ABDOMINAL HYSTERECTOMY  YRS AGO   COMPLETE   CESAREAN SECTION  1998; 2002   ESOPHAGEAL DILATION  02/2020   by Dr Lanae Boast   INGUINAL HERNIA REPAIR Bilateral 1980s    "total of 4 surgeries" (04/23/2017)   IR GASTROSTOMY TUBE MOD SED  12/13/2020   KNEE ARTHROSCOPY WITH MEDIAL MENISECTOMY Left 01/30/2018   Procedure: LEFT KNEE ARTHROSCOPY WITH PARTIAL LATERAL MENISCECTOMY;  Surgeon: Kathryne Hitch, MD;  Location: [facility];  Service: Orthopedics;  Laterality: Left;   KNEE CARTILAGE SURGERY Left    SUBQ ICD IMPLANT N/A 09/13/2021   Procedure: SUBQ ICD IMPLANT;  Surgeon: [name], MD;  Location: [facility] INVASIVE CV LAB;  Service: Cardiovascular;  Laterality: N/A;   TUBAL LIGATION  2002   Social History:   reports that she has quit smoking. Her smoking use included cigarettes. She has never used smokeless tobacco. She reports current alcohol use. She reports current drug use. Drug: Marijuana.  Family History  Problem Relation Age of Onset   Stroke Mother    Diabetes Mother    Diabetes Father    Stroke Maternal Grandmother    Diabetes Maternal  Grandmother    Anemia Paternal Grandmother    Valvular heart disease Paternal Grandmother    Hypertension Mother    Prostate cancer Maternal Uncle        ? intestinal also    Medications: Patient's Medications  New Prescriptions   No medications on file  Previous Medications   ACETAMINOPHEN (TYLENOL) 500 MG TABLET    Take 1 tablet (500 mg total) by mouth every 6 (six) hours as needed.   ALBUTEROL (VENTOLIN HFA) 108 (90 BASE) MCG/ACT INHALER    Inhale 2 puffs into the lungs every 6 (six) hours as needed for wheezing or shortness of breath.   APIXABAN (ELIQUIS) 5 MG TABS TABLET    Take 1 tablet (5 mg total) by mouth 2 (two) times daily.   ATORVASTATIN (LIPITOR) 40 MG TABLET    Take 1.5 tablets (60 mg total) by mouth daily.   BD PEN NEEDLE NANO 2ND GEN 32G X 4 MM MISC    3 (three) times daily. as directed  CARVEDILOL (COREG) 6.25 MG TABLET    TAKE 1 TABLET BY MOUTH TWICE DAILY   CONTINUOUS GLUCOSE RECEIVER (DEXCOM G7 RECEIVER) DEVI    Dispense 1 receiver   CONTINUOUS GLUCOSE SENSOR (DEXCOM G7 SENSOR) MISC    Use 1 sensor every 10 days.   DICLOFENAC SODIUM (VOLTAREN) 1 % GEL    Apply 4 g topically 4 (four) times daily. Apply to left knee   GABAPENTIN (NEURONTIN) 100 MG CAPSULE    Take 2 capsules (200 mg total) by mouth at bedtime.   HYDROXYZINE (VISTARIL) 25 MG CAPSULE    TAKE 1 CAPSULE(25 MG) BY MOUTH AT BEDTIME AS NEEDED   INSULIN SYRINGE-NEEDLE U-100 (INSULIN SYRINGE .5CC/31GX5/16") 31G X 5/16" 0.5 ML MISC    3 (three) times daily.   JARDIANCE 10 MG TABS TABLET    TAKE 1 TABLET(10 MG) BY MOUTH EVERY MORNING   LOSARTAN (COZAAR) 25 MG TABLET    TAKE 1 TABLET BY MOUTH DAILY   METFORMIN (FORTAMET) 500 MG (OSM) 24 HR TABLET    Take 1,000 mg by mouth daily with breakfast.   ONDANSETRON (ZOFRAN-ODT) 4 MG DISINTEGRATING TABLET    Take 1 tablet (4 mg total) by mouth every 8 (eight) hours as needed for nausea or vomiting.   OZEMPIC, 0.25 OR 0.5 MG/DOSE, 2 MG/3ML SOPN    Inject 0.5 mg into the skin  once a week.   QUETIAPINE (SEROQUEL) 100 MG TABLET    Take 1 tablet (100 mg total) by mouth at bedtime.   RIVASTIGMINE (EXELON) 3 MG CAPSULE    TAKE 1 CAPSULE(3 MG) BY MOUTH TWICE DAILY   SERTRALINE (ZOLOFT) 100 MG TABLET    Take 1 tablet (100 mg total) by mouth daily.  Modified Medications   No medications on file  Discontinued Medications   No medications on file    Physical Exam:  Vitals:   06/13/23 1508  BP: 110/70  Pulse: 65  Resp: 20  Temp: 98 F (36.7 C)  SpO2: 98%  Weight: 219 lb (99.3 kg)  Height: 5' 5.5" (1.664 m)   Body mass index is 35.89 kg/m. Wt Readings from Last 3 Encounters:  06/13/23 219 lb (99.3 kg)  06/05/23 215 lb (97.5 kg)  05/06/23 215 lb 9.6 oz (97.8 kg)    Physical Exam Vitals reviewed.  Constitutional:      General: She is not in acute distress. HENT:     Head: Normocephalic.  Eyes:     General:        Right eye: No discharge.        Left eye: No discharge.  Cardiovascular:     Rate and Rhythm: Normal rate and regular rhythm.     Pulses: Normal pulses.     Heart sounds: Normal heart sounds.  Pulmonary:     Effort: Pulmonary effort is normal. No respiratory distress.     Breath sounds: Normal breath sounds. No wheezing.  Chest:     Chest wall: Tenderness present. No deformity.     Comments: Mild tenderness to left chest under axilla where ICD is placed, no skin breakdown Abdominal:     General: Bowel sounds are normal.     Palpations: Abdomen is soft.  Musculoskeletal:     Cervical back: Neck supple.     Right lower leg: No edema.     Left lower leg: No edema.  Skin:    General: Skin is warm.     Capillary Refill: Capillary refill takes less than 2 seconds.  Neurological:     General: No focal deficit present.     Mental Status: She is alert and oriented to person, place, and time.  Psychiatric:        Mood and Affect: Mood normal.     Labs reviewed: Basic Metabolic Panel: Recent Labs    12/06/22 1052 01/08/23 1117  01/10/23 1115 03/05/23 1415  NA 136 138 137 139  K 4.1 3.5 4.0 4.0  CL 104 106 109 106  CO2 21 20* 19* 26  GLUCOSE 127 100* 104* 106*  BUN 11 10 13 13   CREATININE 0.91 0.76 0.83 0.70  CALCIUM 9.1 9.0 9.2 9.3  TSH 0.44  --   --   --    Liver Function Tests: Recent Labs    12/06/22 1052 01/08/23 1117  AST 14 22  ALT 10 19  ALKPHOS  --  97  BILITOT 0.2 0.4  PROT 7.1 7.0  ALBUMIN  --  3.6   Recent Labs    03/05/23 1415  LIPASE 29   No results for input(s): "AMMONIA" in the last 8760 hours. CBC: Recent Labs    12/06/22 1052 01/08/23 0928 01/10/23 1115 03/05/23 1415  WBC 5.9 5.7 5.2 6.4  NEUTROABS 2,755 2.3  --  2,938  HGB 13.5 13.9 14.9 13.6  HCT 44.4 44.3 47.5* 46.3*  MCV 73.3* 72.6* 71.8* 74.4*  PLT 263 227 282 298   Lipid Panel: Recent Labs    01/09/23 0454 04/25/23 0853  CHOL 159 157  HDL 38* 49*  LDLCALC 104* 92  TRIG 85 72  CHOLHDL 4.2 3.2   TSH: Recent Labs    12/06/22 1052  TSH 0.44   A1C: Lab Results  Component Value Date   HGBA1C 7.1 (H) 01/09/2023     Assessment/Plan 1. Essential hypertension (Primary) - controlled - BUN/creat 12/0.74 06/13/2023 - cont losartan and carvedilol - Complete Metabolic Panel with eGFR - CBC with Differential/Platelet  2. Mild intermittent asthma without complication - no recent exacerbations - cont albuterol prn  3. Controlled type 2 diabetes mellitus with hyperglycemia, without long-term current use of insulin (HCC) - followed by [facility] - recent A1c 7.3 - nausea with increased Ozempic dosage increase - discussed eating small meals and trying omeprazole  - 06/13/2023 diabatic eye exam> no retinopathy - cont jardiance, metformin, statin and ARB  4. Gastroesophageal reflux disease with esophagitis and hemorrhage - see above - recommend trying omeprazole OTC   5. Neuropathy - ongoing - involves hands and feet - suspect related to peripheral neuropathy  - will increase  gabapentin to 200 mg po BID - scheduled to see neurology in future - gabapentin (NEURONTIN) 100 MG capsule; Take 2 capsules (200 mg total) by mouth 2 (two) times daily.  Dispense: 120 capsule; Refill: 3  6. Need for pneumococcal 20-valent conjugate vaccination - Pneumococcal conjugate vaccine 20-valent (Prevnar 20)  7. Anoxic brain injury [facility]) - followed by Dr. Hermelinda Medicus - able to work  - performs ADLs on own - poor short term memory but improving  - mother assists with medication management and appointments  8. ICD (implantable cardioverter-defibrillator) in place - placed 09/13/2021 - recent home checks unable to transmit - has not followed up in > 1 year - chest discomfort where ICD was placed on exam - recommend follow up with Dr. Elberta Fortis or AAP   Total time: 48 minutes. Greater than 50% of total time spent doing patient education regarding T2DM, nausea, HTN, ICD, short term memory, depression and  vaccinations including symptom/medication management.    Next appt: 10/17/2023  Hazle Nordmann, Juel Burrow  [facility] [phone]

## 2023-06-14 ENCOUNTER — Encounter: Payer: Self-pay | Admitting: Orthopedic Surgery

## 2023-06-14 LAB — COMPLETE METABOLIC PANEL WITH GFR
AG Ratio: 1.4 (calc) (ref 1.0–2.5)
ALT: 18 U/L (ref 6–29)
AST: 14 U/L (ref 10–35)
Albumin: 4.1 g/dL (ref 3.6–5.1)
Alkaline phosphatase (APISO): 118 U/L (ref 31–125)
BUN: 12 mg/dL (ref 7–25)
CO2: 26 mmol/L (ref 20–32)
Calcium: 9.4 mg/dL (ref 8.6–10.2)
Chloride: 106 mmol/L (ref 98–110)
Creat: 0.74 mg/dL (ref 0.50–0.99)
Globulin: 3 g/dL (ref 1.9–3.7)
Glucose, Bld: 149 mg/dL — ABNORMAL HIGH (ref 65–139)
Potassium: 4.1 mmol/L (ref 3.5–5.3)
Sodium: 138 mmol/L (ref 135–146)
Total Bilirubin: 0.3 mg/dL (ref 0.2–1.2)
Total Protein: 7.1 g/dL (ref 6.1–8.1)
eGFR: 101 mL/min/{1.73_m2} (ref >=60)

## 2023-06-14 LAB — CBC WITH DIFFERENTIAL/PLATELET
Absolute Lymphocytes: 3175 {cells}/uL (ref 850–3900)
Absolute Monocytes: 583 {cells}/uL (ref 200–950)
Basophils Absolute: 32 {cells}/uL (ref 0–200)
Basophils Relative: 0.4 %
Eosinophils Absolute: 227 {cells}/uL (ref 15–500)
Eosinophils Relative: 2.8 %
HCT: 45.5 % — ABNORMAL HIGH (ref 35.0–45.0)
Hemoglobin: 13.6 g/dL (ref 11.7–15.5)
MCH: 21.9 pg — ABNORMAL LOW (ref 27.0–33.0)
MCHC: 29.9 g/dL — ABNORMAL LOW (ref 32.0–36.0)
MCV: 73.3 fL — ABNORMAL LOW (ref 80.0–100.0)
MPV: 11.3 fL (ref 7.5–12.5)
Monocytes Relative: 7.2 %
Neutro Abs: 4082 {cells}/uL (ref 1500–7800)
Neutrophils Relative %: 50.4 %
Platelets: 281 10*3/uL (ref 140–400)
RBC: 6.21 10*6/uL — ABNORMAL HIGH (ref 3.80–5.10)
RDW: 14.4 % (ref 11.0–15.0)
Total Lymphocyte: 39.2 %
WBC: 8.1 10*3/uL (ref 3.8–10.8)

## 2023-06-20 ENCOUNTER — Ambulatory Visit: Admitting: Physician Assistant

## 2023-06-20 ENCOUNTER — Encounter: Payer: Self-pay | Admitting: Physician Assistant

## 2023-06-20 ENCOUNTER — Other Ambulatory Visit (INDEPENDENT_AMBULATORY_CARE_PROVIDER_SITE_OTHER): Payer: Self-pay

## 2023-06-20 DIAGNOSIS — M1712 Unilateral primary osteoarthritis, left knee: Secondary | ICD-10-CM

## 2023-06-20 NOTE — Progress Notes (Signed)
 That is funny you can find it  Office Visit Note   Patient: Courtney Davies           Date of Birth: 1975/05/01           MRN: [id number] Visit Date: 06/20/2023              Requested by: [name], NP [phone] [address] [place],  [place] [address] PCP: [name], NP  Chief Complaint  Patient presents with   Left Knee - Pain      HPI: Patient is a pleasant 48 year old woman who is a patient of Dr. Roda Shutters.  She has a history of arthritis in her left knee.  She had a CT arthrogram last summer because she has a defibrillator and it was unknown if it was compatible with the MRI.  She did have a visit with Dr. Roda Shutters that day.  At that point she had failed an arthroscopy cortisone injections and conservative treatment and he recommended a left knee replacement.  She wanted to go forward with this.  He did tell her that she would have to get a new hemoglobin A1c as well as a prealbumin and get cardiac clearance.  She said she never heard from anybody.  She comes in today continuing to complain of left knee pain  Assessment & Plan: Visit Diagnoses:  1. Primary osteoarthritis of left knee     Plan: Did discuss with her that I will follow-up with what happened with regarding scheduling this.  She has quite a bit of pain she is unsure if she wants to do surgery because she just started a new job.  Will let her discuss this with Dr. Roda Shutters and his team.  In the meantime she is not a good candidate for steroid injections as they do not help her at all.  I offered her referral to pain management she says she is willing to do this  Follow-Up Instructions: No follow-ups on file.   Ortho Exam  Patient is alert, oriented, no adenopathy, well-dressed, normal affect, normal respiratory effort. Left knee she has no effusion no erythema she is very hesitant to extension and flexion she is neurovascular intact compartments are soft and compressible  Imaging: No results found. No images are attached to the  encounter.  Labs: Lab Results  Component Value Date   HGBA1C 7.1 (H) 01/09/2023   HGBA1C 7.3 (H) 10/17/2022   HGBA1C 7.0 (H) 06/07/2022   ESRSEDRATE 34 (H) 10/26/2015   CRP 27.3 (H) 12/18/2020   CRP 0.7 12/01/2020   CRP 5 12/19/2017   REPTSTATUS 12/23/2020 FINAL 12/18/2020   REPTSTATUS 12/23/2020 FINAL 12/18/2020   GRAMSTAIN  12/03/2020    ABUNDANT WBC PRESENT,BOTH PMN AND MONONUCLEAR NO ORGANISMS SEEN    CULT  12/18/2020    NO GROWTH 5 DAYS Performed at [facility], [address], [place], [place] [address]    CULT  12/18/2020    NO GROWTH 5 DAYS Performed at [facility], [address] [place], [place] [address]      Lab Results  Component Value Date   ALBUMIN 3.6 01/08/2023   ALBUMIN 3.9 06/19/2021   ALBUMIN 2.8 (L) 01/19/2021   PREALBUMIN 26 10/17/2022    Lab Results  Component Value Date   MG 2.0 12/14/2020   MG 2.1 12/13/2020   MG 1.8 12/12/2020   No results found for: "VD25OH"  Lab Results  Component Value Date   PREALBUMIN 26  10/17/2022      Latest Ref Rng & Units 06/13/2023    3:52 PM 03/05/2023    2:15 PM 01/10/2023   11:15 AM  CBC EXTENDED  WBC 3.8 - 10.8 Thousand/uL 8.1  6.4  5.2   RBC 3.80 - 5.10 Million/uL 6.21  6.22  6.62   Hemoglobin 11.7 - 15.5 g/dL 30.8  65.7  84.6   HCT 35.0 - 45.0 % 45.5  46.3  47.5   Platelets 140 - 400 Thousand/uL 281  298  282   NEUT# 1,500 - 7,800 cells/uL 4,082  2,938       There is no height or weight on file to calculate BMI.  Orders:  Orders Placed This Encounter  Procedures   XR KNEE 3 VIEW LEFT   Ambulatory referral to Pain Clinic   No orders of the defined types were placed in this encounter.    Procedures: No procedures performed  Clinical Data: No additional findings.  ROS:  All other systems negative, except as noted in the HPI. Review of Systems  Objective: Vital Signs: LMP 12/03/2012   Specialty Comments:  No specialty comments available.  PMFS  History: Patient Active Problem List   Diagnosis Date Noted   Sleep disorder 06/05/2023   Neurocognitive deficits 05/22/2023   Cannabis use disorder 05/22/2023   Stroke (HCC) 01/08/2023   Mild intermittent asthma without complication 02/09/2022   Vitamin B12 deficiency 02/09/2022   MDD (major depressive disorder), single episode, moderate (HCC) 11/08/2021   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   Cough 04/26/2021   Acute blood loss anemia    Controlled type 2 diabetes mellitus with hyperglycemia (HCC)    Essential hypertension    Anoxic brain injury (HCC) 01/02/2021   Fever 12/20/2020   Abdominal wall cellulitis 12/20/2020   Dysphagia    Goals of care, counseling/discussion    Acute systolic heart failure (HCC)    Encephalopathy acute    Type 1 diabetes mellitus without complication (HCC) 12/02/2020   Hypertension 12/02/2020   GERD (gastroesophageal reflux disease) 12/02/2020   Anemia 12/02/2020   Cardiac arrest (HCC) 12/01/2020   Unilateral primary osteoarthritis, left knee 12/01/2018   Lateral meniscus, posterior horn derangement, left 05/14/2018   Status post arthroscopy of left knee 05/14/2018   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/19/2017   Anemia 04/23/2017   Acute bronchitis 04/23/2017   Chronic back pain 10/03/2015   Symptomatic anemia 04/10/2014   Bilateral edema of lower extremity    Low back pain with left-sided sciatica    Past Medical History:  Diagnosis Date   Anemia 09/2015   Anoxic brain injury (HCC)    Arthritis    "knees" (04/23/2017)   Asthma    "teens; went away; came back" (04/23/2017)   B12 deficiency anemia 04/23/2017   Cardiac arrest (HCC)    Chronic bronchitis (HCC)    Chronic low back pain with sciatica    Chronic lower back pain    Diabetes mellitus without complication (HCC)    Diabetes type 2, controlled (HCC)    Elevated ferritin level    Fatty liver    GERD (gastroesophageal reflux disease)    GERD with  stricture    Headache    "1-2/wk" (04/23/2017)   History of blood transfusion "plenty"   "related to anemia" (04/23/2017)   Hypertension    Inguinal hernia    Low back pain    Migraine    "1-2/month" (04/23/2017)   OA (osteoarthritis) of knee--left  Paroxysmal atrial fibrillation (HCC)    Sub-clinical -seen on device?, elevated CHADSVASC   Symptomatic anemia 04/23/2017   Vitamin B 12 deficiency     Family History  Problem Relation Age of Onset   Stroke Mother    Diabetes Mother    Diabetes Father    Stroke Maternal Grandmother    Diabetes Maternal Grandmother    Anemia Paternal Grandmother    Valvular heart disease Paternal Grandmother    Hypertension Mother    Prostate cancer Maternal Uncle        ? intestinal also    Past Surgical History:  Procedure Laterality Date   ABDOMINAL HYSTERECTOMY  YRS AGO   COMPLETE   CESAREAN SECTION  1998; 2002   ESOPHAGEAL DILATION  02/2020   by Dr Lillia Dallas HERNIA REPAIR Bilateral 1980s    "total of 4 surgeries" (04/23/2017)   IR GASTROSTOMY TUBE MOD SED  12/13/2020   KNEE ARTHROSCOPY WITH MEDIAL MENISECTOMY Left 01/30/2018   Procedure: LEFT KNEE ARTHROSCOPY WITH PARTIAL LATERAL MENISCECTOMY;  Surgeon: Kathryne Hitch, MD;  Location: [facility];  Service: Orthopedics;  Laterality: Left;   KNEE CARTILAGE SURGERY Left    SUBQ ICD IMPLANT N/A 09/13/2021   Procedure: SUBQ ICD IMPLANT;  Surgeon: [name], MD;  Location: [facility] INVASIVE CV LAB;  Service: Cardiovascular;  Laterality: N/A;   TUBAL LIGATION  2002   Social History   Occupational History   Occupation: Scientist, product/process development in restaurant  Tobacco Use   Smoking status: Former    Types: Cigarettes   Smokeless tobacco: Never  Vaping Use   Vaping status: Never Used  Substance and Sexual Activity   Alcohol use: Yes    Comment: occ   Drug use: Yes    Types: Marijuana    Comment: daily   Sexual activity: Not Currently    Birth control/protection: Surgical

## 2023-06-21 ENCOUNTER — Telehealth: Payer: Self-pay

## 2023-06-21 NOTE — Telephone Encounter (Signed)
 Patient saw Courtney Davies yesterday. She states that she has been waiting to hear about scheduling her left total knee arthroplasty surgery. [name] does not have a surgery sheet. Can you fill one out for her to call and schedule? Thanks!

## 2023-06-22 ENCOUNTER — Encounter: Payer: Self-pay | Admitting: Orthopaedic Surgery

## 2023-06-22 NOTE — Telephone Encounter (Signed)
 I'll fill out sheet to give to debbie

## 2023-06-25 ENCOUNTER — Telehealth: Payer: Self-pay | Admitting: *Deleted

## 2023-06-25 ENCOUNTER — Other Ambulatory Visit: Payer: Self-pay | Admitting: *Deleted

## 2023-06-25 NOTE — Telephone Encounter (Signed)
   Pre-operative Risk Assessment    Patient Name: Courtney Davies  DOB: April 11, 1976 MRN: [id number]   Date of last office visit: 11/23/2022 Date of next office visit: NONE    Request for Surgical Clearance    Procedure:   LEFT TOTAL KNEE   Date of Surgery:  Clearance TBD                                Surgeon:  Cheral Almas, MD Surgeon's Group or Practice Name:  [facility] Phone number:  [phone] Fax number:  [phone]   Type of Clearance Requested:   - Pharmacy:  Hold Apixaban (Eliquis) NOT INDICATED   Type of Anesthesia:  Spinal   Additional requests/questions:    Wilhemina Cash   06/25/2023, 9:29 AM

## 2023-06-25 NOTE — Patient Outreach (Signed)
 Medicaid Managed Care   Nurse Care Manager Note  06/25/2023 Name:  Courtney Davies MRN:  [id number] DOB:  03-03-1976  Courtney Davies is an 48 y.o. year old female who is a primary patient of [name], Courtney E, NP.  The [facility] Managed Care Coordination team was consulted for assistance with:    Memory Deficits Pain  Courtney Davies was given information about Medicaid Managed Care Coordination team services today. Courtney Davies Patient agreed to services and verbal consent obtained.  Engaged with patient by telephone for follow up visit in response to provider referral for case management and/or care coordination services.   Patient is participating in a Managed Medicaid Plan:  Yes  Assessments/Interventions:  Review of past medical history, allergies, medications, health status, including review of consultants reports, laboratory and other test data, was performed as part of comprehensive evaluation and provision of chronic care management services.  SDOH (Social Drivers of Health) assessments and interventions performed: SDOH Interventions    Flowsheet Row Patient Outreach Telephone from 06/25/2023 in [employer] POPULATION HEALTH DEPARTMENT Counselor from 05/23/2023 in [facility] Patient Outreach Telephone from 05/15/2023 in [place] POPULATION HEALTH DEPARTMENT Patient Outreach Telephone from 04/16/2023 in [place] POPULATION HEALTH DEPARTMENT Patient Outreach Telephone from 03/29/2023 in [place] POPULATION HEALTH DEPARTMENT Telephone from 01/25/2023 in [place] POPULATION HEALTH DEPARTMENT  SDOH Interventions        Food Insecurity Interventions Other (Comment)  [provided patient with epass.Courtney Davies] -- -- -- -- Other (Comment)  [will refer patient to Courtney Davies for follow up]  Housing Interventions Intervention Not Indicated -- -- -- -- --  Transportation Interventions Intervention Not Indicated -- Payor Benefit Payor Benefit -- Other (Comment)   [referral to Courtney Davies placed for SDOH needs]  Depression Interventions/Treatment  -- Counseling, Medication, Referral to Psychiatry -- -- Medication, Referral to Psychiatry --  Stress Interventions -- -- Courtney Davies Provided, Geographical information systems officer has upcoming psychiatry and counseling appointments at [facility] -- Courtney Davies Provided, Provide Counseling --       Care Plan  Allergies  Allergen Reactions   Latex Hives   Other Other (See Comments), Anaphylaxis, Swelling and Hives    Hair glue causes throat to close and hives "glue" Hair glue causes throat to close    Codeine Nausea And Vomiting    Was on an empty stomach.   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   Cough 04/26/2021   Acute blood loss anemia    Controlled type 2 diabetes mellitus with hyperglycemia (HCC)    Essential hypertension    Anoxic brain injury (HCC) 01/02/2021   Fever 12/20/2020   Abdominal wall cellulitis 12/20/2020   Dysphagia    Goals of care, counseling/discussion    Acute systolic heart failure (HCC)    Encephalopathy acute    Type 1 diabetes mellitus without complication (HCC) 12/02/2020   Hypertension 12/02/2020   GERD (gastroesophageal reflux disease) 12/02/2020   Anemia 12/02/2020   Cardiac arrest (HCC) 12/01/2020   Unilateral primary osteoarthritis,  left knee 12/01/2018   Lateral meniscus, posterior horn derangement, left 05/14/2018   Status post arthroscopy of left knee 05/14/2018    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/19/2017   Anemia 04/23/2017   Acute bronchitis 04/23/2017   Chronic back pain 10/03/2015   Symptomatic anemia 04/10/2014   Bilateral edema of lower extremity    Low back pain with left-sided sciatica     Conditions to be addressed/monitored per PCP order:   Memory Deficits and Pain  Care Plan : RN Care Manager Plan of Care  Updates made by Courtney Dach, RN since 06/25/2023 12:00 AM     Problem: Health Management needs related to memory deficits after Anoxic Brain Injury      Long-Range Goal: Development of Plan of Care to address Health Management needs related to memory deficits after Anoxic Brain Injury   Start Date: 02/19/2023  Expected End Date: 05/20/2023  Note:   Current Barriers:  Chronic Disease Management support and education needs related to Anoxic Brain Injury Courtney Davies was unable to complete PT due to left knee pain and transportation issues. She is followed by Orthopedics and needing total knee replacement.   RNCM Clinical Goal(s):  Patient will take all medications exactly as prescribed and will call provider for medication related questions as evidenced by patient reports attend all scheduled medical appointments: Lab on 07/26/23 and Neurology on 09/02/23 as evidenced by provider documentation continue to work with RN Care Manager to address care management and care coordination needs related to  memory deficits related to Anoxic Brain injury as evidenced by adherence to CM Team Scheduled appointments through collaboration with RN Care manager, provider, and care team.   Interventions: Evaluation of current treatment plan related to  self management and patient's adherence to plan as established by provider   Memory Deficits post Anoxic Brain Injury  (Status:  Goal on track:  Yes.)  Long Term Goal Evaluation of current treatment plan related to  memory deficits post Anoxic Brain Injury ,  self-management and patient's adherence  to plan as established by provider. Discussed plans with patient for ongoing care management follow up and provided patient with direct contact information for care management team Advised patient to take all medications to upcoming appointments Provided education to patient re: heart health Reviewed scheduled/upcoming provider appointments including Lab on 07/26/23 and Neurology on 09/02/23 Provided patient with [facility] member services [phone] -patient physically wrote down number, advised to contact regarding insurance coverage-Patient has Medicaid and Medicare LCSW referral for managing the stress of chronic health issues-Patient followed by [facility], provided with contact information [phone] Advised patient to start a medical journal, write down questions and concerns to discuss with provider   Pain Interventions:  (Status:  New goal.) Long Term Goal Pain assessment performed Medications reviewed Reviewed provider established plan for pain management Discussed importance of adherence to all scheduled medical appointments Advised patient to report to care team affect of pain on daily activities Discussed use of relaxation techniques and/or diversional activities to assist with pain reduction (distraction, imagery, relaxation, massage, acupressure, TENS, heat, and cold application Assessed social determinant of health barriers Advised patient to follow up with Dr. Roda Shutters regarding scheduling of surgery   Patient Goals/Self-Care Activities: Take all medications as prescribed Attend all scheduled provider appointments Call provider office for new concerns or questions  Keep a calendar of all upcoming appointments  Follow Up Plan:  Telephone follow up appointment with care management team member scheduled for:  07/10/23 at  3:30pm      Follow Up:  Patient agrees to Care Plan and Follow-up.  Plan: The Managed Medicaid care management team will reach out to the patient again over the next  14 days.  Date/time of next scheduled RN care management/care coordination outreach:  07/10/23 at 3:30pm  Estanislado Emms RN, BSN [place]  Value-Based Care [facility] RN Care Manager [phone]

## 2023-06-25 NOTE — Patient Instructions (Signed)
 Visit Information  Ms. Friedland was given information about Medicaid Managed Care team care coordination services as a part of their [facility] Community Plan Medicaid benefit. [name] verbally consented to engagement with the [facility] Managed Care team.   If you are experiencing a medical emergency, please call 911 or report to your local emergency department or urgent care.   If you have a non-emergency medical problem during routine business hours, please contact your provider's office and ask to speak with a nurse.   For questions related to your [facility], please call: [phone] or visit the homepage here: [email or web address]  If you would like to schedule transportation through your [facility], please call the following number at least 2 days in advance of your appointment: [phone]   Rides for urgent appointments can also be made after hours by calling Member Services.  Call the Behavioral Health Crisis Line at [phone], at any time, 24 hours a day, 7 days a week. If you are in danger or need immediate medical attention call 911.  If you would like help to quit smoking, call 1-800-QUIT-NOW (269-330-0820) OR Espaol: 1-855-Djelo-Ya (6-433-295-1884) o para ms informacin haga clic aqu or Text READY to 166-063 to register via text  Ms. Earlene Plater,   Please see education materials related to managing pain and stroke provided by MyChart link.  Patient verbalizes understanding of instructions and care plan provided today and agrees to view in MyChart. Active MyChart status and patient understanding of how to access instructions and care plan via MyChart confirmed with patient.     Telephone follow up appointment with Managed Medicaid care management team member scheduled for:07/10/23 at 3:30pm  Estanislado Emms RN, BSN [place]  Value-Based Care  [facility] RN Care Manager [phone]   Following is a copy of your plan of care:  Care Plan : RN Care Manager Plan of Care  Updates made by Heidi Dach, RN since 06/25/2023 12:00 AM     Problem: Health Management needs related to memory deficits after Anoxic Brain Injury      Long-Range Goal: Development of Plan of Care to address Health Management needs related to memory deficits after Anoxic Brain Injury   Start Date: 02/19/2023  Expected End Date: 05/20/2023  Note:   Current Barriers:  Chronic Disease Management support and education needs related to Anoxic Brain Injury Ms. Wiechman was unable to complete PT due to left knee pain and transportation issues. She is followed by Orthopedics and needing total knee replacement.   RNCM Clinical Goal(s):  Patient will take all medications exactly as prescribed and will call provider for medication related questions as evidenced by patient reports attend all scheduled medical appointments: Lab on 07/26/23 and Neurology on 09/02/23 as evidenced by provider documentation continue to work with RN Care Manager to address care management and care coordination needs related to  memory deficits related to Anoxic Brain injury as evidenced by adherence to CM Team Scheduled appointments through collaboration with RN Care manager, provider, and care team.   Interventions: Evaluation of current treatment plan related to  self management and patient's adherence to plan as established by provider   Memory Deficits post Anoxic Brain Injury  (Status:  Goal on track:  Yes.)  Long Term Goal Evaluation of current treatment plan related to  memory deficits post Anoxic Brain Injury ,  self-management and patient's adherence to plan as established by provider. Discussed plans with patient for  ongoing care management follow up and provided patient with direct contact information for care management team Advised patient to take all medications to  upcoming appointments Provided education to patient re: heart health Reviewed scheduled/upcoming provider appointments including Lab on 07/26/23 and Neurology on 09/02/23 Provided patient with [facility] member services [phone] -patient physically wrote down number, advised to contact regarding insurance coverage-Patient has Medicaid and Medicare LCSW referral for managing the stress of chronic health issues-Patient followed by [facility], provided with contact information [phone] Advised patient to start a medical journal, write down questions and concerns to discuss with provider   Pain Interventions:  (Status:  New goal.) Long Term Goal Pain assessment performed Medications reviewed Reviewed provider established plan for pain management Discussed importance of adherence to all scheduled medical appointments Advised patient to report to care team affect of pain on daily activities Discussed use of relaxation techniques and/or diversional activities to assist with pain reduction (distraction, imagery, relaxation, massage, acupressure, TENS, heat, and cold application Assessed social determinant of health barriers Advised patient to follow up with Dr. Roda Shutters regarding scheduling of surgery   Patient Goals/Self-Care Activities: Take all medications as prescribed Attend all scheduled provider appointments Call provider office for new concerns or questions  Keep a calendar of all upcoming appointments  Follow Up Plan:  Telephone follow up appointment with care management team member scheduled for:  07/10/23 at 3:30pm

## 2023-06-26 NOTE — Telephone Encounter (Signed)
 Will route to PharmD for rec's re: holding anticoagulation. Pt started on Eliquis after last OV in 11/2022. Pt admitted with TIA sx's in 12/2022 and started on anticoagulation at that time given hx of AFib. Tereso Newcomer, PA-C    06/26/2023 8:10 AM

## 2023-06-27 NOTE — Telephone Encounter (Signed)
 Patient with diagnosis of afib on Eliquis for anticoagulation.    Procedure:  LEFT TOTAL KNEE  Date of procedure: TBD   CHA2DS2-VASc Score = 5   This indicates a 7.2% annual risk of stroke. The patient's score is based upon: CHF History: 0 HTN History: 1 Diabetes History: 1 Stroke History: 2 Vascular Disease History: 0 Age Score: 0 Gender Score: 1      CrCl 111 ml/min Platelet count 281  Patient with possible stroke 01/08/23 Notes state "Stroke-like symptoms s/p TNK, etiology: TIA given PAF vs complicated migraine with vertigo and blurry vision.   CT head 9/24: No acute intracranial hemorrhage or evidence of acute large vessel territory infarct.  Aspects score is 10. CTA head & neck 9/24: Negative CTA.  No occlusion, stenosis, or irregularity of major arteries in the head and neck. Repeat CT head 24 hours post TNK 9/25: No evidence of acute intracranial abnormality.  Minor paranasal sinus disease.  Per office protocol, patient can hold Eliquis for 3 days prior to procedure.    Procedure should be scheduled no sooner than 07/11/23 (6 months from possible CVA)  **This guidance is not considered finalized until pre-operative APP has relayed final recommendations.**

## 2023-06-27 NOTE — Telephone Encounter (Signed)
   Name: Courtney Davies  DOB: 15-Nov-1975  MRN: [id number]  Primary Cardiologist: Lance Muss, MD   Preoperative team, please contact this patient and set up a phone call appointment for further preoperative risk assessment. Please obtain consent and complete medication review. Thank you for your help.  I confirm that guidance regarding antiplatelet and oral anticoagulation therapy has been completed and, if necessary, noted below.  Per office protocol, patient can hold Eliquis for 3 days prior to procedure.     Procedure should be scheduled no sooner than 07/11/23 (6 months from possible CVA)  I also confirmed the patient resides in the state of [place]. As per [facility] Medical Board telemedicine laws, the patient must reside in the state in which the provider is licensed.   Ronney Asters, NP 06/27/2023, 12:03 PM [facility]

## 2023-06-27 NOTE — Telephone Encounter (Signed)
 Left message to call back to schedule tele pre op appt.

## 2023-06-28 NOTE — Telephone Encounter (Signed)
2nd attempt to reach pt to schedule tele pre op appt.

## 2023-07-01 NOTE — Telephone Encounter (Signed)
 Thank you :)

## 2023-07-01 NOTE — Telephone Encounter (Signed)
 Pt returning call

## 2023-07-01 NOTE — Telephone Encounter (Signed)
 S/w the pt about tele preop appt. Pt asked if she could be seen in the office instead as she has a lot of questions. I said that is fine. Pt has been scheduled to see Azalee Course, PAC at [facility] office 07/08/23 @ 1:55. I will update all parties involved. Pt thanked me for the help.   Pt is aware she cannot have procedure until after 07/11/23 which will be 6 months from CVA.

## 2023-07-02 ENCOUNTER — Encounter: Payer: Self-pay | Admitting: Orthopaedic Surgery

## 2023-07-02 ENCOUNTER — Ambulatory Visit (INDEPENDENT_AMBULATORY_CARE_PROVIDER_SITE_OTHER): Admitting: Orthopaedic Surgery

## 2023-07-02 DIAGNOSIS — M1712 Unilateral primary osteoarthritis, left knee: Secondary | ICD-10-CM | POA: Diagnosis not present

## 2023-07-02 DIAGNOSIS — E0869 Diabetes mellitus due to underlying condition with other specified complication: Secondary | ICD-10-CM

## 2023-07-02 LAB — POCT GLYCOSYLATED HEMOGLOBIN (HGB A1C)

## 2023-07-02 NOTE — Progress Notes (Signed)
 Office Visit Note   Patient: Courtney Davies           Date of Birth: 08-06-1975           MRN: [id number] Visit Date: 07/02/2023              Requested by: [name], NP [phone] [address]. [address] [place],  [place] [address] PCP: [name], NP   Assessment & Plan: Visit Diagnoses:  1. Primary osteoarthritis of left knee   2. Diabetes mellitus due to underlying condition with other specified complication [facility])     Plan: Ms. Sypher is a 48 year old female with end-stage left knee DJD.  Questions were answered in detail.  Her sister was on the phone who also had questions.  We will await clearance from cardiology.  We will recheck an A1c today.  Follow-Up Instructions: No follow-ups on file.   Orders:  Orders Placed This Encounter  Procedures   POCT HgB A1C   No orders of the defined types were placed in this encounter.     Procedures: No procedures performed   Clinical Data: No additional findings.   Subjective: Chief Complaint  Patient presents with   Left Knee - Pain    HPI Patient returns today for follow up eval of left knee DJD.   Review of Systems  Constitutional: Negative.   HENT: Negative.    Eyes: Negative.   Respiratory: Negative.    Cardiovascular: Negative.   Endocrine: Negative.   Musculoskeletal: Negative.   Neurological: Negative.   Hematological: Negative.   Psychiatric/Behavioral: Negative.    All other systems reviewed and are negative.    Objective: Vital Signs: LMP 12/03/2012   Physical Exam Vitals and nursing note reviewed.  Constitutional:      Appearance: She is well-developed.  HENT:     Head: Normocephalic and atraumatic.  Pulmonary:     Effort: Pulmonary effort is normal.  Abdominal:     Palpations: Abdomen is soft.  Musculoskeletal:     Cervical back: Neck supple.  Skin:    General: Skin is warm.     Capillary Refill: Capillary refill takes less than 2 seconds.  Neurological:     Mental Status: She is alert and  oriented to person, place, and time.  Psychiatric:        Behavior: Behavior normal.        Thought Content: Thought content normal.        Judgment: Judgment normal.     Ortho Exam Left knee exam is unchanged from prior visit. Specialty Comments:  No specialty comments available.  Imaging: No results found.   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   Cough 04/26/2021   Acute blood loss anemia    Controlled type 2 diabetes mellitus with hyperglycemia (HCC)    Essential hypertension    Anoxic brain injury (HCC) 01/02/2021   Fever 12/20/2020   Abdominal wall cellulitis 12/20/2020   Dysphagia    Goals of care, counseling/discussion    Acute systolic heart failure (HCC)    Encephalopathy acute    Type 1 diabetes mellitus without complication (HCC) 12/02/2020   Hypertension  12/02/2020   GERD (gastroesophageal reflux disease) 12/02/2020   Anemia 12/02/2020   Cardiac arrest (HCC) 12/01/2020   Unilateral primary osteoarthritis, left knee 12/01/2018   Lateral meniscus, posterior horn derangement, left 05/14/2018   Status post arthroscopy of left knee 05/14/2018   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/19/2017   Anemia 04/23/2017   Acute bronchitis 04/23/2017   Chronic back pain 10/03/2015   Symptomatic anemia 04/10/2014   Bilateral edema of lower extremity    Low back pain with left-sided sciatica    Past Medical History:  Diagnosis Date   Anemia 09/2015   Anoxic brain injury (HCC)    Arthritis    "knees" (04/23/2017)   Asthma    "teens; went away; came back" (04/23/2017)   B12  deficiency anemia 04/23/2017   Cardiac arrest (HCC)    Chronic bronchitis (HCC)    Chronic low back pain with sciatica    Chronic lower back pain    Diabetes mellitus without complication (HCC)    Diabetes type 2, controlled (HCC)    Elevated ferritin level    Fatty liver    GERD (gastroesophageal reflux disease)    GERD with stricture    Headache    "1-2/wk" (04/23/2017)   History of blood transfusion "plenty"   "related to anemia" (04/23/2017)   Hypertension    Inguinal hernia    Low back pain    Migraine    "1-2/month" (04/23/2017)   OA (osteoarthritis) of knee--left    Paroxysmal atrial fibrillation (HCC)    Sub-clinical -seen on device?, elevated CHADSVASC   Symptomatic anemia 04/23/2017   Vitamin B 12 deficiency     Family History  Problem Relation Age of Onset   Stroke Mother    Diabetes Mother    Diabetes Father    Stroke Maternal Grandmother    Diabetes Maternal Grandmother    Anemia Paternal Grandmother    Valvular heart disease Paternal Grandmother    Hypertension Mother    Prostate cancer Maternal Uncle        ? intestinal also    Past Surgical History:  Procedure Laterality Date   ABDOMINAL HYSTERECTOMY  YRS AGO   COMPLETE   CESAREAN SECTION  1998; 2002   ESOPHAGEAL DILATION  02/2020   by Dr Lillia Dallas HERNIA REPAIR Bilateral 1980s    "total of 4 surgeries" (04/23/2017)   IR GASTROSTOMY TUBE MOD SED  12/13/2020   KNEE ARTHROSCOPY WITH MEDIAL MENISECTOMY Left 01/30/2018   Procedure: LEFT KNEE ARTHROSCOPY WITH PARTIAL LATERAL MENISCECTOMY;  Surgeon: Kathryne Hitch, MD;  Location: [facility];  Service: Orthopedics;  Laterality: Left;   KNEE CARTILAGE SURGERY Left    SUBQ ICD IMPLANT N/A 09/13/2021   Procedure: SUBQ ICD IMPLANT;  Surgeon: [name], MD;  Location: [facility] INVASIVE CV LAB;  Service: Cardiovascular;  Laterality: N/A;   TUBAL LIGATION  2002   Social History   Occupational History   Occupation: Scientist, product/process development in restaurant   Tobacco Use   Smoking status: Former    Types: Cigarettes   Smokeless tobacco: Never  Vaping Use   Vaping status: Never Used  Substance and Sexual Activity   Alcohol use: Yes    Comment: occ   Drug use: Yes    Types: Marijuana    Comment: daily   Sexual activity: Not Currently    Birth control/protection: Surgical

## 2023-07-04 ENCOUNTER — Encounter: Payer: Self-pay | Admitting: Orthopedic Surgery

## 2023-07-04 ENCOUNTER — Ambulatory Visit (INDEPENDENT_AMBULATORY_CARE_PROVIDER_SITE_OTHER): Admitting: Orthopedic Surgery

## 2023-07-04 VITALS — BP 118/82 | HR 100 | Temp 97.2°F | Resp 16 | Ht 65.5 in | Wt 203.8 lb

## 2023-07-04 DIAGNOSIS — F5101 Primary insomnia: Secondary | ICD-10-CM | POA: Diagnosis not present

## 2023-07-04 MED ORDER — HYDROXYZINE HCL 10 MG PO TABS: 10.0000 mg | ORAL_TABLET | Freq: Every evening | ORAL | 1 refills | Status: DC

## 2023-07-04 NOTE — Progress Notes (Signed)
 Careteam: Patient Care Team: [name], NP as PCP - General (Adult Health Nurse Practitioner) [name], MD as PCP - Cardiology (Cardiology) [name], MD as PCP - Electrophysiology (Cardiology) [place] (Family Medicine) Marcos Eke, RN as Registered Nurse Shaune Leeks as VBCI Care Management Heidi Dach, RN as VBCI Care Management  Seen by: Hazle Nordmann, AGNP-C  PLACE OF SERVICE:  [facility]  Advanced Directive information Does Patient Have a Medical Advance Directive?: No, Would patient like information on creating a medical advance directive?: No - Patient declined  Allergies  Allergen Reactions   Latex Hives   Other Other (See Comments), Anaphylaxis, Swelling and Hives    Hair glue causes throat to close and hives "glue" Hair glue causes throat to close    Codeine Nausea And Vomiting    Was on an empty stomach.   Nitrofurantoin Rash    Chief Complaint  Patient presents with   Insomnia     HPI: Patient is a 48 y.o. female seen today for acute visit due to insomnia.   Ongoing insomnia > 6 months. She is having trouble falling asleep and staying asleep. She was prescribed hydroxyzine 25 mg at bedtime prn and reports improved sleep. Unsuccessful trial melatonin in past. She denies using electronics at bedtime. Avoids food/drinks a few hours prior to bedtime. Attempts to go to sleep same time. Trying to follow stable routine since anoxic brain injury. She is also taking Seroquel, Zoloft and gabapentin for neuropathy and depression. Discussed falls safety today.    Review of Systems:  Review of Systems  Constitutional:  Negative for fever.  Respiratory:  Negative for shortness of breath.   Cardiovascular:  Negative for chest pain.  Psychiatric/Behavioral:  Positive for depression. The patient has insomnia. The patient is not nervous/anxious.     Past Medical History:  Diagnosis Date   Anemia 09/2015    Anoxic brain injury (HCC)    Arthritis    "knees" (04/23/2017)   Asthma    "teens; went away; came back" (04/23/2017)   B12 deficiency anemia 04/23/2017   Cardiac arrest (HCC)    Chronic bronchitis (HCC)    Chronic low back pain with sciatica    Chronic lower back pain    Diabetes mellitus without complication (HCC)    Diabetes type 2, controlled (HCC)    Elevated ferritin level    Fatty liver    GERD (gastroesophageal reflux disease)    GERD with stricture    Headache    "1-2/wk" (04/23/2017)   History of blood transfusion "plenty"   "related to anemia" (04/23/2017)   Hypertension    Inguinal hernia    Low back pain    Migraine    "1-2/month" (04/23/2017)   OA (osteoarthritis) of knee--left    Paroxysmal atrial fibrillation (HCC)    Sub-clinical -seen on device?, elevated CHADSVASC   Symptomatic anemia 04/23/2017   Vitamin B 12 deficiency    Past Surgical History:  Procedure Laterality Date   ABDOMINAL HYSTERECTOMY  YRS AGO   COMPLETE   CESAREAN SECTION  1998; 2002   ESOPHAGEAL DILATION  02/2020   by Dr Lanae Boast   INGUINAL HERNIA REPAIR Bilateral 1980s    "total of 4 surgeries" (04/23/2017)   IR GASTROSTOMY TUBE MOD SED  12/13/2020   KNEE ARTHROSCOPY WITH MEDIAL MENISECTOMY Left 01/30/2018   Procedure: LEFT KNEE ARTHROSCOPY WITH PARTIAL LATERAL MENISCECTOMY;  Surgeon: Kathryne Hitch, MD;  Location: [facility];  Service: Orthopedics;  Laterality: Left;   KNEE CARTILAGE SURGERY Left    SUBQ ICD IMPLANT N/A 09/13/2021   Procedure: SUBQ ICD IMPLANT;  Surgeon: [name], MD;  Location: [facility] INVASIVE CV LAB;  Service: Cardiovascular;  Laterality: N/A;   TUBAL LIGATION  2002   Social History:   reports that she has quit smoking. Her smoking use included cigarettes. She has never used smokeless tobacco. She reports current alcohol use. She reports current drug use. Drug: Marijuana.  Family History  Problem Relation Age of Onset   Stroke Mother    Diabetes Mother     Diabetes Father    Stroke Maternal Grandmother    Diabetes Maternal Grandmother    Anemia Paternal Grandmother    Valvular heart disease Paternal Grandmother    Hypertension Mother    Prostate cancer Maternal Uncle        ? intestinal also    Medications: Patient's Medications  New Prescriptions   HYDROXYZINE (ATARAX) 10 MG TABLET    Take 1 tablet (10 mg total) by mouth at bedtime.  Previous Medications   ACETAMINOPHEN (TYLENOL) 500 MG TABLET    Take 1 tablet (500 mg total) by mouth every 6 (six) hours as needed.   ALBUTEROL (VENTOLIN HFA) 108 (90 BASE) MCG/ACT INHALER    Inhale 2 puffs into the lungs every 6 (six) hours as needed for wheezing or shortness of breath.   APIXABAN (ELIQUIS) 5 MG TABS TABLET    Take 1 tablet (5 mg total) by mouth 2 (two) times daily.   ATORVASTATIN (LIPITOR) 40 MG TABLET    Take 1.5 tablets (60 mg total) by mouth daily.   BD PEN NEEDLE NANO 2ND GEN 32G X 4 MM MISC    3 (three) times daily. as directed   CARVEDILOL (COREG) 6.25 MG TABLET    TAKE 1 TABLET BY MOUTH TWICE DAILY   CONTINUOUS GLUCOSE RECEIVER (DEXCOM G7 RECEIVER) DEVI    Dispense 1 receiver   CONTINUOUS GLUCOSE SENSOR (DEXCOM G7 SENSOR) MISC    Use 1 sensor every 10 days.   DICLOFENAC SODIUM (VOLTAREN) 1 % GEL    Apply 4 g topically 4 (four) times daily. Apply to left knee   GABAPENTIN (NEURONTIN) 100 MG CAPSULE    Take 2 capsules (200 mg total) by mouth 2 (two) times daily.   INSULIN SYRINGE-NEEDLE U-100 (INSULIN SYRINGE .5CC/31GX5/16") 31G X 5/16" 0.5 ML MISC    3 (three) times daily.   JARDIANCE 10 MG TABS TABLET    TAKE 1 TABLET(10 MG) BY MOUTH EVERY MORNING   LOSARTAN (COZAAR) 25 MG TABLET    TAKE 1 TABLET BY MOUTH DAILY   METFORMIN (FORTAMET) 500 MG (OSM) 24 HR TABLET    Take 1,000 mg by mouth daily with breakfast.   ONDANSETRON (ZOFRAN-ODT) 4 MG DISINTEGRATING TABLET    Take 1 tablet (4 mg total) by mouth every 8 (eight) hours as needed for nausea or vomiting.   OZEMPIC, 0.25 OR 0.5  MG/DOSE, 2 MG/3ML SOPN    Inject 0.5 mg into the skin once a week.   QUETIAPINE (SEROQUEL) 100 MG TABLET    Take 1 tablet (100 mg total) by mouth at bedtime.   RIVASTIGMINE (EXELON) 3 MG CAPSULE    TAKE 1 CAPSULE(3 MG) BY MOUTH TWICE DAILY   SERTRALINE (ZOLOFT) 100 MG TABLET    Take 1 tablet (100 mg total) by mouth daily.  Modified Medications   No medications on file  Discontinued  Medications   HYDROXYZINE (VISTARIL) 25 MG CAPSULE    TAKE 1 CAPSULE(25 MG) BY MOUTH AT BEDTIME AS NEEDED    Physical Exam:  Vitals:   07/04/23 1513  BP: 118/82  Pulse: 100  Resp: 16  Temp: (!) 97.2 F (36.2 C)  SpO2: 98%  Weight: 203 lb 12.8 oz (92.4 kg)  Height: 5' 5.5" (1.664 m)   Body mass index is 33.4 kg/m. Wt Readings from Last 3 Encounters:  07/04/23 203 lb 12.8 oz (92.4 kg)  06/13/23 219 lb (99.3 kg)  06/05/23 215 lb (97.5 kg)    Physical Exam Vitals reviewed.  Constitutional:      General: She is not in acute distress. HENT:     Head: Normocephalic.  Eyes:     General:        Right eye: No discharge.        Left eye: No discharge.  Cardiovascular:     Rate and Rhythm: Normal rate and regular rhythm.     Pulses: Normal pulses.     Heart sounds: Normal heart sounds.  Pulmonary:     Effort: Pulmonary effort is normal.     Breath sounds: Normal breath sounds.  Musculoskeletal:        General: Normal range of motion.     Cervical back: Neck supple.  Skin:    General: Skin is warm.     Capillary Refill: Capillary refill takes less than 2 seconds.  Neurological:     General: No focal deficit present.     Mental Status: She is alert and oriented to person, place, and time.  Psychiatric:        Mood and Affect: Mood normal.     Labs reviewed: Basic Metabolic Panel: Recent Labs    12/06/22 1052 01/08/23 1117 01/10/23 1115 03/05/23 1415 06/13/23 1552  NA 136   < > 137 139 138  K 4.1   < > 4.0 4.0 4.1  CL 104   < > 109 106 106  CO2 21   < > 19* 26 26  GLUCOSE 127   <  > 104* 106* 149*  BUN 11   < > 13 13 12   CREATININE 0.91   < > 0.83 0.70 0.74  CALCIUM 9.1   < > 9.2 9.3 9.4  TSH 0.44  --   --   --   --    < > = values in this interval not displayed.   Liver Function Tests: Recent Labs    12/06/22 1052 01/08/23 1117 06/13/23 1552  AST 14 22 14   ALT 10 19 18   ALKPHOS  --  97  --   BILITOT 0.2 0.4 0.3  PROT 7.1 7.0 7.1  ALBUMIN  --  3.6  --    Recent Labs    03/05/23 1415  LIPASE 29   No results for input(s): "AMMONIA" in the last 8760 hours. CBC: Recent Labs    01/08/23 0928 01/10/23 1115 03/05/23 1415 06/13/23 1552  WBC 5.7 5.2 6.4 8.1  NEUTROABS 2.3  --  2,938 4,082  HGB 13.9 14.9 13.6 13.6  HCT 44.3 47.5* 46.3* 45.5*  MCV 72.6* 71.8* 74.4* 73.3*  PLT 227 282 298 281   Lipid Panel: Recent Labs    01/09/23 0454 04/25/23 0853  CHOL 159 157  HDL 38* 49*  LDLCALC 104* 92  TRIG 85 72  CHOLHDL 4.2 3.2   TSH: Recent Labs    12/06/22 1052  TSH 0.44  A1C: Lab Results  Component Value Date   HGBA1C 7.1 (H) 01/09/2023     Assessment/Plan 1. Primary insomnia (Primary) - ongoing - trying to promote healthy sleep habits - unsuccessful trial melatonin 5 mg - hydroxyzine effective - will try hydroxyzine 10 mg at bedtime  - hydrOXYzine (ATARAX) 10 MG tablet; Take 1 tablet (10 mg total) by mouth at bedtime.  Dispense: 90 tablet; Refill: 1  Total time: 21. Greater than 50% of total time spent doing patient education regarding insomnia including symptom/medication management.    Next appt: 10/17/2023  Hazle Nordmann, Juel Burrow  [facility] [phone]

## 2023-07-04 NOTE — Patient Instructions (Addendum)
 We will try hydroxyzine 10 mg every night for sleep  If you continue to have insomnia> ask Dr. Riley Kill about reducing Seroquel

## 2023-07-08 ENCOUNTER — Ambulatory Visit: Attending: Physician Assistant | Admitting: Physician Assistant

## 2023-07-08 ENCOUNTER — Encounter: Payer: Self-pay | Admitting: Physician Assistant

## 2023-07-08 VITALS — BP 126/85 | HR 62 | Ht 65.0 in | Wt 206.0 lb

## 2023-07-08 DIAGNOSIS — E119 Type 2 diabetes mellitus without complications: Secondary | ICD-10-CM | POA: Diagnosis present

## 2023-07-08 DIAGNOSIS — I10 Essential (primary) hypertension: Secondary | ICD-10-CM | POA: Insufficient documentation

## 2023-07-08 DIAGNOSIS — Z01810 Encounter for preprocedural cardiovascular examination: Secondary | ICD-10-CM | POA: Insufficient documentation

## 2023-07-08 DIAGNOSIS — I480 Paroxysmal atrial fibrillation: Secondary | ICD-10-CM | POA: Insufficient documentation

## 2023-07-08 DIAGNOSIS — I469 Cardiac arrest, cause unspecified: Secondary | ICD-10-CM | POA: Insufficient documentation

## 2023-07-08 NOTE — Progress Notes (Unsigned)
 Cardiology Office Note:  .   Date:  07/09/2023  ID:  Courtney Davies, DOB 12/12/1975, MRN [id number] PCP: [name], NP  [facility] Providers Cardiologist:  Lance Muss, MD Electrophysiologist:  Will Jorja Loa, MD     History of Present Illness: .   Courtney Davies is a 48 y.o. female was past medical history of hypertension, DM II, PAF on Eliquis, out-of-hospital V-fib arrest 8/22 s/p [employer] subcu ICD, and anoxic brain injury.  Patient was admitted to the hospital with cardiac arrest in August 2022.  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  When EMS arrived, she was in V-fib and required 5 rounds of epi, 5 episode of defibrillation and given 400 mg of amiodarone.  She arrived in the ED intubated.  CT of the chest showed no PE.  She had prolonged downtime and required multiple defibrillations.  COVID test was positive.  Therefore, there was some consideration of possible respiratory event prior to the V-fib arrest.  Hospital note mention possible transient atrial fibrillation, however there was no recurrence episode on telemetry.  Therefore patient was not placed on long-term anticoagulation therapy.  Coronary CTA obtained in October 2022 showed no evidence of CAD.  Cardiac MRI in November 222 showed normal LV size, mild hypertrophy, low normal systolic function EF 53 %, septal bounce, normal RV size and systolic function of 71%, no LGE, mild MR.  No evidence of intra filtrate of heart disease.  Patient was last admitted in September 2020 for with acute onset of dizziness and blurry vision.  Upon arrival to ED, she has significant amount of anxiety which made exam very difficult.  CT of the head was reviewed prior to TNK administration and showed no acute process.  CTA of the head and neck was negative without significant occlusion.  MRI of the brain showed no acute finding.  Repeat CT of the  head 24 hours after TNK administration showed no evidence of acute abnormality.  Device interrogation showed that she did have atrial fibrillation, therefore neurology service discussed with Dr. Elberta Fortis and started her on Eliquis 5 mg twice a day.  Echocardiogram obtained during admission on 01/09/2023 showed EF 60 to 65%, no regional wall motion abnormality, normal RV, mild MR.  She is pending left total knee surgery by Dr. Roda Shutters.  Our clinical pharmacist recommend to hold Eliquis for 3 days prior to the procedure.  Patient presents today for follow-up and preop clearance.  She has no recent chest pain or shortness of breath.  She has no lower extremity edema, orthopnea or PND.  She does have left knee swelling related to severe knee issue.  EKG showed no significant ST-T wave changes.  She has no problem achieving 4 METS of activity despite knee issue.  She is cleared from the cardiac perfect perspective to proceed with surgery without further workup.  She will need to hold Eliquis for 3 days prior to the procedure and restart as soon as possible afterward at the surgeon's discretion.  I spoke with our device clinic, with her subcu ICD which is lower than the traditional ICD, device clinic recommend use a large Tegaderm to tape a magnet over the ICD during the surgery.  Otherwise, patient can follow-up with Dr. Elberta Fortis in 57-month.  ROS:   She denies chest pain, palpitations, dyspnea, pnd, orthopnea, n, v, dizziness, syncope, edema, weight  gain, or early satiety. All other systems reviewed and are otherwise negative except as noted above.    Studies Reviewed: Marland Kitchen   EKG Interpretation Date/Time:  Monday July 08 2023 14:16:36 EDT Ventricular Rate:  62 PR Interval:  142 QRS Duration:  84 QT Interval:  426 QTC Calculation: 432 R Axis:   67  Text Interpretation: Normal sinus rhythm  Poor R wave progression in the anterior leads Confirmed by Azalee Course [phone]) on 07/09/2023 2:17:34 PM    Cardiac Studies &  Procedures   ______________________________________________________________________________________________     ECHOCARDIOGRAM  ECHOCARDIOGRAM COMPLETE 01/09/2023  Narrative ECHOCARDIOGRAM REPORT    Patient Name:   Courtney Davies Date of Exam: 01/09/2023 Medical Rec #:  [id number]       Height:       66.0 in Accession #:    [id number]      Weight:       233.2 lb Date of Birth:  [birth year]       BSA:          2.135 m Patient Age:    47 years        BP:           155/91 mmHg Patient Gender: F               HR:           58 bpm. Exam Location:  Inpatient  Procedure: 2D Echo, Cardiac Doppler and Color Doppler  Indications:    Stroke I63.9  History:        Patient has prior history of Echocardiogram examinations, most recent 02/13/2021. Defibrillator, Stroke; Risk Factors:Hypertension, Diabetes, Dyslipidemia and Former Smoker.  Sonographer:    Dondra Prader RVT RCS Referring Phys: [id number] Lynnae January  IMPRESSIONS   1. Left ventricular ejection fraction, by estimation, is 60 to 65%. The left ventricle has normal function. The left ventricle has no regional wall motion abnormalities. Left ventricular diastolic parameters are indeterminate. 2. Right ventricular systolic function is normal. The right ventricular size is normal. Tricuspid regurgitation signal is inadequate for assessing PA pressure. 3. The mitral valve is normal in structure. Mild mitral valve regurgitation. No evidence of mitral stenosis. 4. The aortic valve is normal in structure. Aortic valve regurgitation is not visualized. No aortic stenosis is present. 5. The inferior vena cava is dilated in size with >50% respiratory variability, suggesting right atrial pressure of 8 mmHg.  Conclusion(s)/Recommendation(s): No intracardiac source of embolism detected on this transthoracic study. Consider agitated saline contrast injection and/or transesophageal echocardiogram to exclude cardiac source of embolism if clinically  indicated.  FINDINGS Left Ventricle: Left ventricular ejection fraction, by estimation, is 60 to 65%. The left ventricle has normal function. The left ventricle has no regional wall motion abnormalities. The left ventricular internal cavity size was normal in size. There is no left ventricular hypertrophy. Left ventricular diastolic parameters are indeterminate. Normal left ventricular filling pressure.  Right Ventricle: The right ventricular size is normal. No increase in right ventricular wall thickness. Right ventricular systolic function is normal. Tricuspid regurgitation signal is inadequate for assessing PA pressure.  Left Atrium: Left atrial size was normal in size.  Right Atrium: Right atrial size was normal in size.  Pericardium: Trivial pericardial effusion is present. The pericardial effusion is anterior to the right ventricle.  Mitral Valve: The mitral valve is normal in structure. Mild mitral valve regurgitation. No evidence of mitral valve stenosis.  Tricuspid Valve: The tricuspid valve is normal in structure.  Tricuspid valve regurgitation is not demonstrated. No evidence of tricuspid stenosis.  Aortic Valve: The aortic valve is normal in structure. Aortic valve regurgitation is not visualized. No aortic stenosis is present. Aortic valve mean gradient measures 4.0 mmHg. Aortic valve peak gradient measures 7.6 mmHg. Aortic valve area, by VTI measures 2.79 cm.  Pulmonic Valve: The pulmonic valve was normal in structure. Pulmonic valve regurgitation is not visualized. No evidence of pulmonic stenosis.  Aorta: The aortic root is normal in size and structure.  Venous: The inferior vena cava is dilated in size with greater than 50% respiratory variability, suggesting right atrial pressure of 8 mmHg.  IAS/Shunts: No atrial level shunt detected by color flow Doppler.   LEFT VENTRICLE PLAX 2D LVIDd:         4.70 cm   Diastology LVIDs:         3.50 cm   LV e' medial:    7.38  cm/s LV PW:         0.80 cm   LV E/e' medial:  12.4 LV IVS:        1.00 cm   LV e' lateral:   8.95 cm/s LVOT diam:     2.15 cm   LV E/e' lateral: 10.2 LV SV:         89 LV SV Index:   41 LVOT Area:     3.63 cm   RIGHT VENTRICLE             IVC RV Basal diam:  3.50 cm     IVC diam: 2.50 cm RV S prime:     15.40 cm/s TAPSE (M-mode): 2.8 cm  LEFT ATRIUM             Index        RIGHT ATRIUM           Index LA diam:        3.40 cm 1.59 cm/m   RA Area:     15.40 cm LA Vol (A2C):   53.9 ml 25.25 ml/m  RA Volume:   41.40 ml  19.39 ml/m LA Vol (A4C):   50.9 ml 23.84 ml/m LA Biplane Vol: 56.2 ml 26.32 ml/m AORTIC VALVE                    PULMONIC VALVE AV Area (Vmax):    2.95 cm     PV Vmax:       0.94 m/s AV Area (Vmean):   2.88 cm     PV Peak grad:  3.5 mmHg AV Area (VTI):     2.79 cm AV Vmax:           138.00 cm/s AV Vmean:          92.400 cm/s AV VTI:            0.318 m AV Peak Grad:      7.6 mmHg AV Mean Grad:      4.0 mmHg LVOT Vmax:         112.00 cm/s LVOT Vmean:        73.400 cm/s LVOT VTI:          0.244 m LVOT/AV VTI ratio: 0.77  AORTA Ao Root diam: 2.90 cm Ao Asc diam:  3.10 cm  MITRAL VALVE MV Area (PHT): 2.99 cm    SHUNTS MV Decel Time: 254 msec    Systemic VTI:  0.24 m MV E velocity: 91.70 cm/s  Systemic Diam: 2.15 cm MV  A velocity: 96.90 cm/s MV E/A ratio:  0.95  Armanda Magic MD Electronically signed by Armanda Magic MD Signature Date/Time: 01/09/2023/2:54:13 PM    Final      CT SCANS  CT CORONARY MORPH W/CTA COR W/SCORE 02/02/2021  Addendum 02/02/2021  2:06 PM ADDENDUM REPORT: 02/02/2021 14:04  CLINICAL DATA:  Chest pain  EXAM: Cardiac/Coronary CTA  TECHNIQUE: A non-contrast, gated CT scan was obtained with axial slices of 3 mm through the heart for calcium scoring. Calcium scoring was performed using the Agatston method. A 100 kV retrospective, gated, contrast cardiac scan was obtained. Gantry rotation speed was 250 msecs  and collimation was 0.6 mm. Two sublingual nitroglycerin tablets (0.8 mg) were given. The 3D data set was reconstructed in 5% intervals of the 0-95% of the R-R cycle. Diastolic phases were analyzed on a dedicated workstation using MPR, MIP, and VRT modes. The patient received 95 cc of contrast.  FINDINGS: Image quality: Excellent.  Noise artifact is: Limited.  Coronary Arteries:  Normal coronary origin.  Right dominance.  Left main: The left main is a large caliber vessel with a normal take off from the left coronary cusp that trifurcates into a LAD, LCX, and ramus intermedius. There is no plaque or stenosis.  Left anterior descending artery: The LAD is patent without evidence of plaque or stenosis. The LAD gives off 1 large patent diagonal branch.  Ramus intermedius: Patent with no evidence of plaque or stenosis.  Left circumflex artery: The LCX is non-dominant and patent with no evidence of plaque or stenosis. The LCX gives off 2 patent obtuse marginal branches.  Right coronary artery: The RCA is dominant with normal take off from the right coronary cusp. There is no evidence of plaque or stenosis. The RCA terminates as a PDA and right posterolateral branch without evidence of plaque or stenosis.  Right Atrium: Right atrial size is within normal limits.  Right Ventricle: The right ventricular cavity is within normal limits.  Left Atrium: Left atrial size is normal in size with no left atrial appendage filling defect.  Left Ventricle: The ventricular cavity size is within normal limits. There are no stigmata of prior infarction. There is no abnormal filling defect.  Pulmonary arteries: Normal in size without proximal filling defect.  Pulmonary veins: Normal pulmonary venous drainage.  Pericardium: Normal thickness with no significant effusion or calcium present.  Cardiac valves: The aortic valve is trileaflet without significant calcification. The mitral valve is  normal structure without significant calcification.  Aorta: Normal caliber with no significant disease.  Extra-cardiac findings: See attached radiology report for non-cardiac structures.  IMPRESSION: 1. Coronary calcium score of 0.  2. Normal coronary origin with right dominance.  3. Normal coronary arteries.  RECOMMENDATIONS: 1. No evidence of CAD (0%). Consider non-atherosclerotic causes of chest pain.  Lennie Odor, MD   Electronically Signed By: Lennie Odor M.D. On: 02/02/2021 14:04  Narrative EXAM: OVER-READ INTERPRETATION  CT CHEST  The following report is an over-read performed by radiologist Dr. Jeronimo Greaves of [facility] on 02/02/2021. This over-read does not include interpretation of cardiac or coronary anatomy or pathology. The coronary CTA interpretation by the cardiologist is attached.  COMPARISON:  04/23/2017 chest radiograph.  12/01/2020 chest CTA.  FINDINGS: Vascular: Normal aortic caliber. No central pulmonary embolism, on this non-dedicated study.  Mediastinum/Nodes: No imaged thoracic adenopathy.  Lungs/Pleura: No pleural fluid.  Clear imaged lungs.  Upper Abdomen: Normal imaged portions of the liver, spleen  Musculoskeletal: Healing anterior right rib  fractures approximally fourth through 6.  IMPRESSION: Healing, subacute right fourth through sixth anterior rib fractures.  Otherwise, no acute process in the imaged extracardiac chest.  Electronically Signed: By: Jeronimo Greaves M.D. On: 02/02/2021 13:51   CARDIAC MRI  MR CARDIAC MORPHOLOGY W WO CONTRAST 03/14/2021  Narrative CLINICAL DATA:  61F with cardiac arrest. No CAD on Coronary CTA. Echocardiogram shows normal biventricular function, moderate MR  EXAM: CARDIAC MRI  TECHNIQUE: The patient was scanned on a 1.5 Tesla Siemens magnet. A dedicated cardiac coil was used. Functional imaging was done using Fiesta sequences. 2,3, and 4 chamber views were done to  assess for RWMA's. Modified Simpson's rule using a short axis stack was used to calculate an ejection fraction on a dedicated work [employer]. The patient received 14 cc of Gadavist. After 10 minutes inversion recovery sequences were used to assess for infiltration and scar tissue.  CONTRAST:  14 cc  of Gadavist  FINDINGS: Left ventricle:  -Normal size  -Mild hypertrophy  -Low normal systolic function.  Septal bounce  -Nonspecific ECV elevation (32%)  -No LGE  LV EF:  53% (Normal 56-78%)  Absolute volumes:  LV EDV: (Normal 52-141 mL)  LV ESV: 67mL (Normal 13-51 mL)  LV SV: 76mL (Normal 33-97 mL)  CO: 5.0L/min (Normal 2.7-6.0 L/min)  Indexed volumes:  LV EDV: 17mL/sq-m (Normal 41-81 mL/sq-m)  LV ESV: 42mL/sq-m (Normal 12-21 mL/sq-m)  LV SV: 84mL/sq-m (Normal 26-56 mL/sq-m)  CI: 2.3L/min/sq-m (Normal 1.8-3.8 L/min/sq-m)  Right ventricle: Normal size and systolic function  RV EF: 71% (Normal 47-80%)  Absolute volumes:  RV EDV: 94mL (Normal 58-154 mL)  RV ESV: 27mL (Normal 12-68 mL)  RV SV: 67mL (Normal 35-98 mL)  CO: 4.5L/min (Normal 2.7-6 L/min)  Indexed volumes:  RV EDV: 11mL/sq-m (Normal 48-87 mL/sq-m)  RV ESV: 55mL/sq-m (Normal 11-28 mL/sq-m)  RV SV: 13mL/sq-m (Normal 27-57 mL/sq-m)  CI: 2.1L/min/sq-m (Normal 1.8-3.8 L/min/sq-m)  Left atrium: Mild enlargement  Right atrium: Normal size  Mitral valve: Mild regurgitation (regurgitant fraction 11%)  Aortic valve: Tricuspid.  No regurgitation  Tricuspid valve: No regurgitation  Pulmonic valve: No regurgitation  Aorta: Normal proximal ascending aorta  Pericardium: Trivial effusion  IMPRESSION: 1. Normal LV size, mild hypertrophy, and low normal systolic function (EF 53%). Septal bounce  2.  Normal RV size and systolic function (EF 71%)  3.  No late gadolinium enhancement  4.  Mild mitral regurgitation (regurgitant fraction 11%)   Electronically  Signed By: Epifanio Lesches M.D. On: 03/14/2021 22:07   ______________________________________________________________________________________________      Risk Assessment/Calculations:    CHA2DS2-VASc Score = 5   This indicates a 7.2% annual risk of stroke. The patient's score is based upon: CHF History: 0 HTN History: 1 Diabetes History: 1 Stroke History: 2 Vascular Disease History: 0 Age Score: 0 Gender Score: 1           Physical Exam:   VS:  BP 126/85   Pulse 62   Ht 5\' 5"  (1.651 m)   Wt 206 lb (93.4 kg)   LMP 12/03/2012   SpO2 95%   BMI 34.28 kg/m    Wt Readings from Last 3 Encounters:  07/08/23 206 lb (93.4 kg)  07/04/23 203 lb 12.8 oz (92.4 kg)  06/13/23 219 lb (99.3 kg)    GEN: Well nourished, well developed in no acute distress NECK: No JVD; No carotid bruits CARDIAC: RRR, no murmurs, rubs, gallops RESPIRATORY:  Clear to auscultation without rales, wheezing or rhonchi  ABDOMEN: Soft, non-tender, non-distended EXTREMITIES:  No edema; No deformity   ASSESSMENT AND PLAN: .     Preoperative cardiac clearance for left total knee surgery Undergoing preoperative evaluation for left total knee surgery. Moderate risk due to cardiac history, but surgery is low risk. Cleared for surgery from cardiac perspective - previous coronary CT shows no significant CAD - Hold Eliquis for three days prior to the procedure and restart as soon as possible afterward at the surgeon's discretion. - Use a large Tegaderm to tape a magnet over the ICD during the surgery to prevent interference from electrocautery. - Follow up with Dr. Elberta Fortis in six months.  Atrial fibrillation Atrial fibrillation, currently on Eliquis 5 mg. Need to hold medication prior to upcoming surgery to reduce bleeding risk.  Cardiac arrest with subcutaneous ICD Cardiac arrest with subcutaneous ICD implanted. No recent shocks or untreated episodes. Device functioning properly, no evidence of  interference or malfunction.  -Discussed with device clinic, recommend use a large Tegaderm to tape a magnet over the subcu ICD during the surgery.  Hypertension: Blood pressure stable  DM 2: On insulin, managed by primary care provider       Dispo: Follow-up with Dr. Elberta Fortis in 6 months  Signed, Azalee Course, [place]

## 2023-07-08 NOTE — Patient Instructions (Signed)
 Medication Instructions:  NO CHANGES *If you need a refill on your cardiac medications before your next appointment, please call your pharmacy*   Lab Work: NO LABS If you have labs (blood work) drawn today and your tests are completely normal, you will receive your results only by: MyChart Message (if you have MyChart) OR A paper copy in the mail If you have any lab test that is abnormal or we need to change your treatment, we will call you to review the results.   Testing/Procedures: NO TESTING   Follow-Up: At [facility], you and your health needs are our priority.  As part of our continuing mission to provide you with exceptional heart care, we have created designated Provider Care Teams.  These Care Teams include your primary Cardiologist (physician) and Advanced Practice Providers (APPs -  Physician Assistants and Nurse Practitioners) who all work together to provide you with the care you need, when you need it.  Your next appointment:   6 month(s)  Provider:   Loman Brooklyn, MD Other Instructions

## 2023-07-10 ENCOUNTER — Other Ambulatory Visit: Payer: Self-pay | Admitting: *Deleted

## 2023-07-10 NOTE — Patient Outreach (Signed)
 Medicaid Managed Care   Nurse Care Manager Note  07/10/2023 Name:  Courtney Davies MRN:  [id number] DOB:  1975/05/12  Courtney Davies is an 48 y.o. year old female who is a primary patient of Davies, Courtney E, NP.  The [facility] Managed Care Coordination team was consulted for assistance with:    Memory Deficits Pain  Ms. Courtney Davies was given information about Medicaid Managed Care Coordination team services today. Courtney Davies Patient agreed to services and verbal consent obtained.  Engaged with patient by telephone for follow up visit in response to provider referral for case management and/or care coordination services.   Patient is participating in a Managed Medicaid Plan:  Yes  Assessments/Interventions:  Review of past medical history, allergies, medications, health status, including review of consultants reports, laboratory and other test data, was performed as part of comprehensive evaluation and provision of chronic care management services.  SDOH (Social Drivers of Health) assessments and interventions performed: SDOH Interventions    Flowsheet Row Office Visit from 07/04/2023 in [facility] Patient Outreach Telephone from 06/25/2023 in [place] POPULATION HEALTH DEPARTMENT Patient Outreach Telephone from 05/15/2023 in [place] POPULATION HEALTH DEPARTMENT Patient Outreach Telephone from 04/16/2023 in [place] POPULATION HEALTH DEPARTMENT Patient Outreach Telephone from 03/29/2023 in [place] POPULATION HEALTH DEPARTMENT Telephone from 01/25/2023 in [place] POPULATION HEALTH DEPARTMENT  SDOH Interventions        Food Insecurity Interventions -- Other (Comment)  [provided patient with epass.[place].gov] -- -- -- Other (Comment)  [will refer patient to Courtney Davies for follow up]  Housing Interventions -- Intervention Not Indicated -- -- -- --  Transportation Interventions -- Intervention Not Indicated Payor Benefit Payor Benefit -- Other  (Comment)  [referral to [employer] placed for SDOH needs]  Depression Interventions/Treatment  Counseling -- -- -- Medication, Referral to Psychiatry --  Stress Interventions -- -- [facility] Provided, Geographical information systems officer has upcoming psychiatry and counseling appointments at [facility] -- [facility] Provided, Provide Counseling --  Social Connections Interventions Intervention Not Indicated -- -- -- -- --       Care Plan  Allergies  Allergen Reactions   Latex Hives   Other Other (See Comments), Anaphylaxis, Swelling and Hives    Hair glue causes throat to close and hives "glue" Hair glue causes throat to close    Codeine Nausea And Vomiting    Was on an empty stomach.   Nitrofurantoin Rash    Medications Reviewed Today     Reviewed by Heidi Dach, RN (Registered Nurse) on 07/10/23 at 1632  Med List Status: <None>   Medication Order Taking? Sig Documenting Provider Last Dose Status Informant  albuterol (VENTOLIN HFA) 108 (90 Base) MCG/ACT inhaler [id number] No Inhale 2 puffs into the lungs every 6 (six) hours as needed for wheezing or shortness of breath.  Patient not taking: Reported on 07/10/2023   [name], PA-C Not Taking Active Self, Mother, Pharmacy Records  apixaban (ELIQUIS) 5 MG TABS tablet [id number] Yes Take 1 tablet (5 mg total) by mouth 2 (two) times daily. [name], NP Taking Active            Med Note Normand Sloop, JASMINE Davies   Tue Mar 05, 2023  1:11 PM)    atorvastatin (LIPITOR) 40 MG tablet [id number] Yes Take 1.5 tablets (60 mg total) by mouth daily. Davies, Courtney E, NP Taking Active   BD PEN NEEDLE NANO 2ND GEN 32G  X 4 MM MISC [id number] Yes 3 (three) times daily. as directed [provider] Taking Active Self, Mother, Pharmacy Records  carvedilol (COREG) 6.25 MG tablet [id number] No TAKE 1 TABLET BY MOUTH TWICE DAILY Davies, Courtney E, NP Unknown Active   Continuous Glucose Receiver Vira Agar G7 RECEIVER) DEVI [id number] No Dispense  1 receiver  Patient not taking: Reported on 07/10/2023   [provider] Not Taking Active   Continuous Glucose Sensor (DEXCOM G7 SENSOR) MISC [id number] No Use 1 sensor every 10 days.  Patient not taking: Reported on 07/10/2023   [provider] Not Taking Active   diclofenac Sodium (VOLTAREN) 1 % GEL [id number] No Apply 4 g topically 4 (four) times daily. Apply to left knee  Patient not taking: Reported on 07/10/2023   Courtney Davies, [name], NP Not Taking Active Self, Mother, Pharmacy Records  gabapentin (NEURONTIN) 100 MG capsule [id number] Yes Take 2 capsules (200 mg total) by mouth 2 (two) times daily. Davies, Courtney E, NP Taking Active   hydrOXYzine (ATARAX) 10 MG tablet [id number] Yes Take 1 tablet (10 mg total) by mouth at bedtime. Courtney Nordmann E, NP Taking Active   Insulin Syringe-Needle U-100 (INSULIN SYRINGE .5CC/31GX5/16") 31G X 5/16" 0.5 ML MISC [id number] No 3 (three) times daily.  Patient not taking: Reported on 07/10/2023   [provider] Not Taking Active Self, Mother, Pharmacy Records  JARDIANCE 10 MG TABS tablet [id number] Yes TAKE 1 TABLET(10 MG) BY MOUTH EVERY MORNING Davies, Courtney E, NP Taking Active   losartan (COZAAR) 25 MG tablet [id number] Yes TAKE 1 TABLET BY MOUTH DAILY Davies, Courtney E, NP Taking Active   metformin (FORTAMET) 500 MG (OSM) 24 hr tablet [id number] Yes Take 1,000 mg by mouth daily with breakfast. [provider] Taking Active Self, Mother, Pharmacy Records  ondansetron (ZOFRAN-ODT) 4 MG disintegrating tablet [id number] No Take 1 tablet (4 mg total) by mouth every 8 (eight) hours as needed for nausea or vomiting.  Patient not taking: Reported on 07/10/2023   Courtney Sheffield, MD Not Taking Active   OZEMPIC, 0.25 OR 0.5 MG/DOSE, 2 MG/3ML SOPN [id number] Yes Inject 0.5 mg into the skin once a week. [provider] Taking Active Self, Mother, Pharmacy Records  QUEtiapine (SEROQUEL) 100 MG tablet [id number] Yes Take 1 tablet (100 mg  total) by mouth at bedtime. Courtney Oyster, MD Taking Active   rivastigmine (EXELON) 3 MG capsule [id number] Yes TAKE 1 CAPSULE(3 MG) BY MOUTH TWICE DAILY Courtney Oyster, MD Taking Active Self, Mother, Pharmacy Records  sertraline (ZOLOFT) 100 MG tablet [id number] Yes Take 1 tablet (100 mg total) by mouth daily. 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   Cough 04/26/2021   Acute blood loss anemia    Controlled type 2 diabetes mellitus with hyperglycemia (HCC)    Essential hypertension    Anoxic brain injury (HCC) 01/02/2021   Fever 12/20/2020   Abdominal wall cellulitis 12/20/2020   Dysphagia    Goals of care, counseling/discussion    Acute systolic heart failure (HCC)    Encephalopathy acute    Type  1 diabetes mellitus without complication (HCC) 12/02/2020   Hypertension 12/02/2020   GERD (gastroesophageal reflux disease) 12/02/2020   Anemia 12/02/2020   Cardiac arrest (HCC) 12/01/2020   Unilateral primary osteoarthritis, left knee 12/01/2018   Lateral meniscus, posterior horn derangement, left 05/14/2018   Status post arthroscopy of left knee 05/14/2018   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/19/2017   Anemia 04/23/2017   Acute bronchitis 04/23/2017   Chronic back pain 10/03/2015   Symptomatic anemia 04/10/2014   Bilateral edema of lower extremity    Low back pain with left-sided sciatica     Conditions to be addressed/monitored per PCP order:   Memory Deficit and Pain  Care Plan : RN Care Manager Plan of Care  Updates made by Heidi Dach, RN  since 07/10/2023 12:00 AM     Problem: Health Management needs related to memory deficits after Anoxic Brain Injury      Long-Range Goal: Development of Plan of Care to address Health Management needs related to memory deficits after Anoxic Brain Injury   Start Date: 02/19/2023  Expected End Date: 05/20/2023  Note:   Current Barriers:  Chronic Disease Management support and education needs related to Anoxic Brain Injury Ms. Lockner continues to work at [employer]. She is followed by Orthopedics for left knee pain and waiting to hear back regarding scheduling for left knee replacement.   RNCM Clinical Goal(s):  Patient will take all medications exactly as prescribed and will call provider for medication related questions as evidenced by patient reports attend all scheduled medical appointments: Lab on 07/26/23 and Neurology on 09/02/23 as evidenced by provider documentation continue to work with RN Care Manager to address care management and care coordination needs related to  memory deficits related to Anoxic Brain injury as evidenced by adherence to CM Team Scheduled appointments through collaboration with RN Care manager, provider, and care team.   Interventions: Evaluation of current treatment plan related to  self management and patient's adherence to plan as established by provider Warm transfer to T. Mena Goes, discussed with patient   Memory Deficits post Anoxic Brain Injury  (Status:  Goal on track:  Yes.)  Long Term Goal Evaluation of current treatment plan related to  memory deficits post Anoxic Brain Injury ,  self-management and patient's adherence to plan as established by provider. Discussed plans with patient for ongoing care management follow up and provided patient with direct contact information for care management team Advised patient to take all medications to upcoming appointments Provided education to patient re: heart health Reviewed scheduled/upcoming provider  appointments including Lab on  Neurology on 09/02/23, 10/02/23 with Pain Management, and PCP on 10/17/23 Provided patient with [facility] member services [phone] -patient physically wrote down number, advised to contact regarding insurance coverage-Patient has Medicaid and Medicare LCSW referral for managing the stress of chronic health issues-Patient followed by [facility] appointment 07/30/23, provided with contact information [phone] Advised patient to start a medical journal, write down questions and concerns to discuss with provider Discussed patient's mother helps with medication management and requesting medication refills Collaborated with PCP regarding Carvedilol refill   Pain Interventions:  (Status:  Goal on track:  Yes.) Long Term Goal Pain assessment performed -10/10 Medications reviewed Reviewed provider established plan for pain management Discussed importance of adherence to all scheduled medical appointments Advised patient to report to care team affect of pain on daily activities Discussed use of relaxation techniques and/or diversional activities to assist with pain reduction (distraction, imagery, relaxation,  massage, acupressure, TENS, heat, and cold application Assessed social determinant of health barriers Advised patient to follow up with Dr. Roda Shutters regarding scheduling of surgery-waiting on scheduling Cleared by Cardiology for knee surgery, waiting to be scheduled   Patient Goals/Self-Care Activities: Take all medications as prescribed Attend all scheduled provider appointments Call provider office for new concerns or questions  Keep a calendar of all upcoming appointments  Follow Up Plan:  Telephone follow up appointment with care management team member scheduled for:  07/31/23 at 3pm      Follow Up:  Patient agrees to Care Plan and Follow-up.  Plan: The Managed Medicaid care management team will reach out to the patient again over the next 30 days.  Date/time  of next scheduled RN care management/care coordination outreach:  07/31/23 at 3pm  Estanislado Emms RN, BSN [place]  [facility] RN Care Manager [phone]

## 2023-07-10 NOTE — Patient Instructions (Signed)
 Visit Information  Ms. Courtney Davies was given information about Medicaid Managed Care team care coordination services as a part of their [employer] [facility] Community Plan Medicaid benefit. Courtney Davies verbally consented to engagement with the [facility] Managed Care team.   If you are experiencing a medical emergency, please call 911 or report to your local emergency department or urgent care.   If you have a non-emergency medical problem during routine business hours, please contact your provider's office and ask to speak with a nurse.   For questions related to your [facility], please call: [phone] or visit the homepage here: [email or web address]  If you would like to schedule transportation through your [facility], please call the following number at least 2 days in advance of your appointment: [phone]   Rides for urgent appointments can also be made after hours by calling Member Services.  Call the Behavioral Health Crisis Line at [phone], at any time, 24 hours a day, 7 days a week. If you are in danger or need immediate medical attention call 911.  If you would like help to quit smoking, call 1-800-QUIT-NOW (716-692-9236) OR Espaol: 1-855-Djelo-Ya (6-063-016-0109) o para ms informacin haga clic aqu or Text READY to 323-557 to register via text  Ms. Courtney Davies,   Please see education materials related to knee pain provided by MyChart link.  Patient verbalizes understanding of instructions and care plan provided today and agrees to view in MyChart. Active MyChart status and patient understanding of how to access instructions and care plan via MyChart confirmed with patient.     Telephone follow up appointment with Managed Medicaid care management team member scheduled for:07/31/23 at 3pm  Courtney Emms RN, BSN [place]  [facility] RN  Care Manager [phone]   Following is a copy of your plan of care:  Care Plan : RN Care Manager Plan of Care  Updates made by Courtney Dach, RN since 07/10/2023 12:00 AM     Problem: Health Management needs related to memory deficits after Anoxic Brain Injury      Long-Range Goal: Development of Plan of Care to address Health Management needs related to memory deficits after Anoxic Brain Injury   Start Date: 02/19/2023  Expected End Date: 05/20/2023  Note:   Current Barriers:  Chronic Disease Management support and education needs related to Anoxic Brain Injury Courtney Davies continues to work at [employer]. She is followed by Orthopedics for left knee pain and waiting to hear back regarding scheduling for left knee replacement.   RNCM Clinical Goal(s):  Patient will take all medications exactly as prescribed and will call provider for medication related questions as evidenced by patient reports attend all scheduled medical appointments: Lab on 07/26/23 and Neurology on 09/02/23 as evidenced by provider documentation continue to work with RN Care Manager to address care management and care coordination needs related to  memory deficits related to Anoxic Brain injury as evidenced by adherence to CM Team Scheduled appointments through collaboration with RN Care manager, provider, and care team.   Interventions: Evaluation of current treatment plan related to  self management and patient's adherence to plan as established by provider Warm transfer to [facility], discussed with patient   Memory Deficits post Anoxic Brain Injury  (Status:  Goal on track:  Yes.)  Long Term Goal Evaluation of current treatment plan related to  memory deficits post Anoxic Brain Injury ,  self-management and patient's adherence to  plan as established by provider. Discussed plans with patient for ongoing care management follow up and provided patient with direct contact information for care management  team Advised patient to take all medications to upcoming appointments Provided education to patient re: heart health Reviewed scheduled/upcoming provider appointments including Lab on  Neurology on 09/02/23, 10/02/23 with Pain Management, and PCP on 10/17/23 Provided patient with [facility] member services [phone] -patient physically wrote down number, advised to contact regarding insurance coverage-Patient has Medicaid and Medicare LCSW referral for managing the stress of chronic health issues-Patient followed by [facility] appointment 07/30/23, provided with contact information [phone] Advised patient to start a medical journal, write down questions and concerns to discuss with provider Discussed patient's mother helps with medication management and requesting medication refills Collaborated with PCP regarding Carvedilol refill   Pain Interventions:  (Status:  Goal on track:  Yes.) Long Term Goal Pain assessment performed -10/10 Medications reviewed Reviewed provider established plan for pain management Discussed importance of adherence to all scheduled medical appointments Advised patient to report to care team affect of pain on daily activities Discussed use of relaxation techniques and/or diversional activities to assist with pain reduction (distraction, imagery, relaxation, massage, acupressure, TENS, heat, and cold application Assessed social determinant of health barriers Advised patient to follow up with Dr. Roda Shutters regarding scheduling of surgery-waiting on scheduling Cleared by Cardiology for knee surgery, waiting to be scheduled   Patient Goals/Self-Care Activities: Take all medications as prescribed Attend all scheduled provider appointments Call provider office for new concerns or questions  Keep a calendar of all upcoming appointments  Follow Up Plan:  Telephone follow up appointment with care management team member scheduled for:  07/31/23 at 3pm

## 2023-07-11 ENCOUNTER — Other Ambulatory Visit: Payer: Self-pay | Admitting: Orthopedic Surgery

## 2023-07-11 DIAGNOSIS — I10 Essential (primary) hypertension: Secondary | ICD-10-CM

## 2023-07-11 MED ORDER — CARVEDILOL 6.25 MG PO TABS
6.2500 mg | ORAL_TABLET | Freq: Two times a day (BID) | ORAL | 1 refills | Status: DC
Start: 2023-07-11 — End: 2023-11-14

## 2023-07-12 ENCOUNTER — Ambulatory Visit: Admitting: Family

## 2023-07-12 ENCOUNTER — Encounter: Payer: Self-pay | Admitting: Family

## 2023-07-12 VITALS — BP 132/80 | HR 91 | Temp 97.6°F | Resp 20 | Ht 65.0 in | Wt 205.8 lb

## 2023-07-12 DIAGNOSIS — R310 Gross hematuria: Secondary | ICD-10-CM | POA: Diagnosis not present

## 2023-07-12 DIAGNOSIS — R102 Pelvic and perineal pain: Secondary | ICD-10-CM | POA: Diagnosis not present

## 2023-07-12 DIAGNOSIS — B3731 Acute candidiasis of vulva and vagina: Secondary | ICD-10-CM

## 2023-07-12 LAB — POCT URINALYSIS DIPSTICK
Blood, UA: NEGATIVE
Glucose, UA: POSITIVE — AB
Ketones, UA: NEGATIVE
Leukocytes, UA: NEGATIVE
Nitrite, UA: NEGATIVE
Protein, UA: POSITIVE — AB
Spec Grav, UA: 1.02 (ref 1.010–1.025)
Urobilinogen, UA: 0.2 U/dL
pH, UA: 5 (ref 5.0–8.0)

## 2023-07-12 MED ORDER — CRANBERRY 250-30 MG PO TABS: 250.0000 mg | ORAL_TABLET | Freq: Two times a day (BID) | ORAL | 1 refills | Status: AC

## 2023-07-13 LAB — CBC WITH DIFFERENTIAL/PLATELET
Absolute Lymphocytes: 3238 {cells}/uL (ref 850–3900)
Absolute Monocytes: 518 {cells}/uL (ref 200–950)
Basophils Absolute: 28 {cells}/uL (ref 0–200)
Basophils Relative: 0.4 %
Eosinophils Absolute: 206 {cells}/uL (ref 15–500)
Eosinophils Relative: 2.9 %
HCT: 43.6 % (ref 35.0–45.0)
Hemoglobin: 12.9 g/dL (ref 11.7–15.5)
MCH: 22 pg — ABNORMAL LOW (ref 27.0–33.0)
MCHC: 29.6 g/dL — ABNORMAL LOW (ref 32.0–36.0)
MCV: 74.3 fL — ABNORMAL LOW (ref 80.0–100.0)
MPV: 11.1 fL (ref 7.5–12.5)
Monocytes Relative: 7.3 %
Neutro Abs: 3110 {cells}/uL (ref 1500–7800)
Neutrophils Relative %: 43.8 %
Platelets: 281 10*3/uL (ref 140–400)
RBC: 5.87 10*6/uL — ABNORMAL HIGH (ref 3.80–5.10)
RDW: 14.1 % (ref 11.0–15.0)
Total Lymphocyte: 45.6 %
WBC: 7.1 10*3/uL (ref 3.8–10.8)

## 2023-07-13 LAB — URINE CULTURE
MICRO NUMBER:: 16262137
Result:: NO GROWTH
SPECIMEN QUALITY:: ADEQUATE

## 2023-07-14 NOTE — Progress Notes (Signed)
 Provider: Charnae Lill FNP-C  [name], NP  Patient Care Team: [name], NP as PCP - General (Adult Health Nurse Practitioner) [name], MD as PCP - Cardiology (Cardiology) [name], MD as PCP - Electrophysiology (Cardiology) [place], [facility] Medicine) Marcos Eke, RN as Registered Nurse [facility]  Extended Emergency Contact Information Primary Emergency Contact: Ives,Barbara Address: [address]          [place], [place] [address] [place] [address] Home Phone: [phone] Relation: Mother Secondary Emergency Contact: Johnson,[name] Mobile Phone: [phone] Relation: Sister Preferred language: English Interpreter needed? No Father: Telitha, [place] Mobile Phone: [phone]  Code Status: Full Code  Goals of care: Advanced Directive information    07/12/2023    3:28 PM  Advanced Directives  Does Patient Have a Medical Advance Directive? No  Would patient like information on creating a medical advance directive? No - Patient declined     Chief Complaint  Patient presents with   Acute Visit    Seeing blood when wiped from front to back area.    Discussed the use of AI scribe software for clinical note transcription with the patient, who gave verbal consent to proceed.  History of Present Illness   SHARVI MOONEYHAN "[name]" is a 48 year old female who presents with blood in urine and stool.  She has been experiencing blood in her urine and stool for the past two weeks, noticing blood when wiping after urination and defecation. She has not had her menstrual period during this time, which she finds unusual.  She experiences dysuria, describing it as stinging and painful urination, and reports frequent urination, which she attributes to high water intake. She feels she empties her bladder completely. No back pain is reported.  She experiences sharp, cramp-like abdominal  pain located in the lower abdomen, which she likens to 'being pregnant,' although she confirms she is not pregnant. The pain worsens when lying down but improves when sitting.  She was evaluated by an OBGYN at [facility] on July 11, 2023. A urine dipstick test showed blood and white blood cells, but no nitrites. She was prescribed metronidazole 0.75 mg to be inserted vaginally at bedtime for five days and fluconazole 150 mg orally as a single dose for a yeast infection. She has not yet started these medications. 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   Past Medical History:  Diagnosis Date   Anemia 09/2015   Anoxic brain injury (HCC)    Arthritis    "knees" (04/23/2017)   Asthma    "teens; went away; came back" (04/23/2017)   B12 deficiency anemia 04/23/2017   Cardiac arrest (HCC)    Chronic bronchitis (HCC)    Chronic low back pain with sciatica    Chronic lower back pain    Diabetes mellitus without complication (HCC)    Diabetes type 2, controlled (HCC)    Elevated ferritin level    Fatty liver    GERD (gastroesophageal reflux disease)    GERD with stricture    Headache    "1-2/wk" (04/23/2017)   History of blood transfusion "plenty"   "related to anemia" (04/23/2017)   Hypertension    Inguinal hernia    Low back pain    Migraine    "1-2/month" (04/23/2017)   OA (osteoarthritis) of knee--left    Paroxysmal  atrial fibrillation (HCC)    Sub-clinical -seen on device?, elevated CHADSVASC   Symptomatic anemia 04/23/2017   Vitamin B 12 deficiency    Past Surgical History:  Procedure Laterality Date   ABDOMINAL HYSTERECTOMY  YRS AGO   COMPLETE   CESAREAN SECTION  1998; 2002   ESOPHAGEAL DILATION  02/2020   by Dr Lillia Dallas HERNIA REPAIR Bilateral 1980s    "total of 4 surgeries" (04/23/2017)   IR GASTROSTOMY TUBE MOD SED  12/13/2020   KNEE ARTHROSCOPY WITH MEDIAL MENISECTOMY Left  01/30/2018   Procedure: LEFT KNEE ARTHROSCOPY WITH PARTIAL LATERAL MENISCECTOMY;  Surgeon: Kathryne Hitch, MD;  Location: [facility];  Service: Orthopedics;  Laterality: Left;   KNEE CARTILAGE SURGERY Left    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   Nitrofurantoin Rash    Outpatient Encounter Medications as of 07/12/2023  Medication Sig   apixaban (ELIQUIS) 5 MG TABS tablet Take 1 tablet (5 mg total) by mouth 2 (two) times daily.   atorvastatin (LIPITOR) 40 MG tablet Take 1.5 tablets (60 mg total) by mouth daily.   BD PEN NEEDLE NANO 2ND GEN 32G X 4 MM MISC 3 (three) times daily. as directed   carvedilol (COREG) 6.25 MG tablet Take 1 tablet (6.25 mg total) by mouth 2 (two) times daily.   Cranberry 250-30 MG TABS Take 250 mg by mouth in the morning and at bedtime for 3 days.   gabapentin (NEURONTIN) 100 MG capsule Take 2 capsules (200 mg total) by mouth 2 (two) times daily.   hydrOXYzine (ATARAX) 10 MG tablet Take 1 tablet (10 mg total) by mouth at bedtime.   JARDIANCE 10 MG TABS tablet TAKE 1 TABLET(10 MG) BY MOUTH EVERY MORNING   losartan (COZAAR) 25 MG tablet TAKE 1 TABLET BY MOUTH DAILY   metformin (FORTAMET) 500 MG (OSM) 24 hr tablet Take 1,000 mg by mouth daily with breakfast.   OZEMPIC, 0.25 OR 0.5 MG/DOSE, 2 MG/3ML SOPN Inject 0.5 mg into the skin once a week.   QUEtiapine (SEROQUEL) 100 MG tablet Take 1 tablet (100 mg total) by mouth at bedtime.   rivastigmine (EXELON) 3 MG capsule TAKE 1 CAPSULE(3 MG) BY MOUTH TWICE DAILY   sertraline (ZOLOFT) 100 MG tablet Take 1  tablet (100 mg total) by mouth daily.   albuterol (VENTOLIN HFA) 108 (90 Base) MCG/ACT inhaler Inhale 2 puffs into the lungs every 6 (six) hours as needed for wheezing or shortness of breath. (Patient not taking: Reported on 07/10/2023)   No facility-administered encounter medications on file as of 07/12/2023.    Review of Systems  Constitutional:  Negative for appetite change, chills, fatigue, fever and unexpected weight change.  Eyes:  Negative for pain, discharge, redness, itching and visual disturbance.  Respiratory:  Negative for cough, chest tightness, shortness of breath and wheezing.   Cardiovascular:  Negative for chest pain, palpitations and leg swelling.  Gastrointestinal:  Positive for abdominal pain. Negative for abdominal distention, blood in stool, constipation, diarrhea, nausea and vomiting.  Endocrine: Negative for cold intolerance, heat intolerance, polydipsia, polyphagia and polyuria.  Genitourinary:  Positive for dysuria and frequency. Negative for difficulty urinating, flank pain and urgency.  Musculoskeletal:  Negative for arthralgias, back pain, gait problem, joint swelling and myalgias.  Skin:  Negative for color change, pallor, rash and wound.  Neurological:  Negative for dizziness, light-headedness and headaches.  Hematological:  Does not bruise/bleed easily.  Psychiatric/Behavioral:  Negative for agitation, behavioral problems, confusion, hallucinations and sleep disturbance. The patient is not nervous/anxious.     Immunization History  Administered Date(s) Administered   Influenza, Seasonal, Injecte, Preservative Fre 01/10/2023   Influenza,inj,Quad PF,6+ Mos 01/23/2022   PFIZER Comirnaty(Gray Top)Covid-19 Tri-Sucrose Vaccine 06/27/2021   PNEUMOCOCCAL CONJUGATE-20 06/13/2023   Pfizer Covid-19 Vaccine Bivalent Booster 42yrs & up 08/23/2021   Tdap 11/07/2015   Pertinent  Health Maintenance Due  Topic Date Due   HEMOGLOBIN A1C  07/09/2023   FOOT EXAM  03/20/2024    OPHTHALMOLOGY EXAM  06/12/2024   Colonoscopy  12/04/2025   INFLUENZA VACCINE  Completed      03/07/2023    3:41 PM 03/21/2023   10:33 AM 05/06/2023    1:51 PM 06/05/2023    9:37 AM 06/13/2023    3:07 PM  Fall Risk  Falls in the past year? 0 0 0 0 0  Was there an injury with Fall? 0 0 0  0  Fall Risk Category Calculator 0 0 0  0  Patient at Risk for Falls Due to No Fall Risks No Fall Risks No Fall Risks  No Fall Risks  Fall risk Follow up Falls evaluation completed;Education provided;Falls prevention discussed Falls evaluation completed;Education provided;Falls prevention discussed Falls evaluation completed  Falls evaluation completed;Education provided;Falls prevention discussed   Functional Status Survey:    Vitals:   07/12/23 1530  BP: 132/80  Pulse: 91  Resp: 20  Temp: 97.6 F (36.4 C)  SpO2: 99%  Weight: 205 lb 12.8 oz (93.4 kg)  Height: 5\' 5"  (1.651 m)   Body mass index is 34.25 kg/m. Physical Exam GENERAL: Alert, cooperative, well developed, no acute distress. HEENT: Normocephalic, normal oropharynx, moist mucous membranes. CHEST: Clear to auscultation bilaterally, no wheezes, rhonchi, or crackles. CARDIOVASCULAR: Normal heart rate and rhythm, S1 and S2 normal without murmurs. ABDOMEN: Tender in lower region, otherwise soft, non-distended, without organomegaly, normal bowel sounds. EXTREMITIES: No cyanosis or edema. NEUROLOGICAL: Cranial nerves grossly intact, moves all extremities without gross motor or sensory deficit.   Labs reviewed: Recent Labs    01/10/23 1115 03/05/23 1415 06/13/23 1552  NA 137 139 138  K 4.0 4.0 4.1  CL 109 106 106  CO2 19* 26 26  GLUCOSE 104* 106* 149*  BUN 13 13 12   CREATININE 0.83 0.70 0.74  CALCIUM 9.2 9.3 9.4   Recent Labs    12/06/22 1052 01/08/23 1117 06/13/23 1552  AST 14 22 14   ALT 10 19 18   ALKPHOS  --  97  --   BILITOT 0.2 0.4 0.3  PROT 7.1 7.0 7.1  ALBUMIN  --  3.6  --    Recent Labs    03/05/23 1415  06/13/23 1552 07/12/23 1618  WBC 6.4 8.1 7.1  NEUTROABS 2,938 4,082 3,110  HGB 13.6 13.6 12.9  HCT 46.3* 45.5* 43.6  MCV 74.4* 73.3* 74.3*  PLT 298 281 281   Lab Results  Component Value Date   TSH 0.44 12/06/2022   Lab Results  Component Value Date   HGBA1C 7.1 (H) 01/09/2023  Lab Results  Component Value Date   CHOL 157 04/25/2023   HDL 49 (L) 04/25/2023   LDLCALC 92 04/25/2023   TRIG 72 04/25/2023   CHOLHDL 3.2 04/25/2023    Significant Diagnostic Results in last 30 days:  XR KNEE 3 VIEW LEFT Result Date: 06/20/2023 Three views of her left knee no acute changes or fractures   Assessment/Plan  Hematuria Hematuria for two weeks with associated lower abdominal pain and dysuria. Urine dipstick showed blood and white blood cells, but nitrites were negative, indicating no confirmed bacterial infection. She was previously treated with metronidazole and fluconazole for suspected yeast infection, but no antibiotics for bacterial infection were prescribed. Differential diagnosis includes urinary tract infection, but further investigation is needed. A urine culture is necessary to confirm the presence of a bacterial infection and guide appropriate antibiotic therapy. - Order urine culture to identify any bacterial infection. - Advise taking cranberry tablets to acidify urine and prevent bacterial multiplication.  Abdominal Pain Lower abdominal pain described as sharp and cramping, similar to pregnancy-related discomfort. No tenderness on physical examination. Differential diagnosis includes urinary tract infection or other gynecological issues. Seen by Gynecologist yesterday. Further evaluation is needed to determine the underlying cause of the pain.  Yeast Infection Yeast infection identified on urine dipstick. She has been prescribed metronidazole and fluconazole by Gynecologist yesterday but has not yet started the treatment. The antifungal treatment is necessary to address the  yeast infection and alleviate symptoms. - Encourage starting antifungal treatment with metronidazole and fluconazole as prescribed.  Family/ staff Communication: Reviewed plan of care with patient verbalized understanding   Labs/tests ordered:  - POC Urinalysis Dipstick  - Urine Culture  Next Appointment: Return if symptoms worsen or fail to improve.   Total time: minutes. Greater than 50% of total time spent doing patient education regarding abdominal pain,urine frequency,Yeast infection, health maintenance including symptom/medication management.   Caesar Bookman, NP

## 2023-07-18 ENCOUNTER — Ambulatory Visit (INDEPENDENT_AMBULATORY_CARE_PROVIDER_SITE_OTHER)

## 2023-07-18 DIAGNOSIS — I469 Cardiac arrest, cause unspecified: Secondary | ICD-10-CM | POA: Diagnosis not present

## 2023-07-18 LAB — CUP PACEART REMOTE DEVICE CHECK
Battery Remaining Percentage: 79 %
Date Time Interrogation Session: 20250402205800
HighPow Impedance: 70 Ohm
Implantable Lead Connection Status: 753985
Implantable Lead Implant Date: 20230531
Implantable Lead Location: 753862
Implantable Lead Model: 3501
Implantable Lead Serial Number: 235497
Implantable Pulse Generator Implant Date: 20230531
Pulse Gen Serial Number: 177788

## 2023-07-22 DIAGNOSIS — H52209 Unspecified astigmatism, unspecified eye: Secondary | ICD-10-CM | POA: Diagnosis not present

## 2023-07-22 DIAGNOSIS — H5213 Myopia, bilateral: Secondary | ICD-10-CM | POA: Diagnosis not present

## 2023-07-22 DIAGNOSIS — H524 Presbyopia: Secondary | ICD-10-CM | POA: Diagnosis not present

## 2023-07-24 ENCOUNTER — Other Ambulatory Visit: Payer: Self-pay | Admitting: Orthopedic Surgery

## 2023-07-24 DIAGNOSIS — F5105 Insomnia due to other mental disorder: Secondary | ICD-10-CM

## 2023-07-25 ENCOUNTER — Ambulatory Visit: Admitting: Orthopedic Surgery

## 2023-07-26 ENCOUNTER — Other Ambulatory Visit: Payer: Medicaid Other

## 2023-07-26 DIAGNOSIS — E538 Deficiency of other specified B group vitamins: Secondary | ICD-10-CM | POA: Diagnosis not present

## 2023-07-26 DIAGNOSIS — R3129 Other microscopic hematuria: Secondary | ICD-10-CM | POA: Diagnosis not present

## 2023-07-29 ENCOUNTER — Telehealth: Payer: Self-pay | Admitting: *Deleted

## 2023-07-29 NOTE — Progress Notes (Signed)
 Complex Care Management Care Guide Note  07/29/2023 Name: [name] MRN: [id number] DOB: 04/25/1975  Courtney Davies is a 48 y.o. year old female who is a primary care patient of Celina Colla, [name], NP and is actively engaged with the care management team. I reached out to Courtney Davies by phone today to assist with re-scheduling  with the RN Case Manager.  Follow up plan: Unsuccessful telephone outreach attempt made. A HIPAA compliant phone message was left for the patient providing contact information and requesting a return call. Barnie Bora  [facility]  [facility], [facility] Guide  Direct Dial: [phone]  Fax [phone]

## 2023-07-29 NOTE — Progress Notes (Deleted)
 BH MD Outpatient Progress Note  07/29/2023 8:44 AM Courtney Davies  MRN:  [id number]  Assessment:  [name] presents for follow-up evaluation. Today, 07/29/23, patient reports ***  Identifying Information: Courtney Davies is a 48 y.o. female with a history of depression, HTN, T2DM, V-fib arrest and anoxic brain injury requiring extensive hospitalization in 2022 s/p ICD, mild asthma, OSA, Vitamin B12 deficiency, and chronic lower back pain who is an established patient with [facility] for management of mood. On initial evaluation, patient denied psychiatric history prior to cardiac arrest in 2022 resulting in anoxic brain injury and neurocognitive sequelae in form of forgetfulness, impaired problem solving, irritability and emotion dysregulation. She reports symptoms of depression including low mood most days, impaired appetite, anhedonia, and hopelessness with passive SI. Patient shows insight into her deficits which lends to increasing frustration with impairments and decline in functioning. She is currently working and performs ADLs independently although requires assistance with iADLs (mom sets medications out in pill box; sons manage finances).   Plan:  # MDD Past medication trials: Celexa (mood and agitation during 2022 hospitalization) Status of problem: new problem to this provider Interventions: -- Continue Zoloft 100 mg daily (i2/5/25)             -- Multiple attempts were made to reach out to patient's mother who manages medications however unable to connect; will send in new medication and encouraged patient that she or mother can call clinic with questions -- Prescribed Seroquel 100 mg nightly by PM&R -- Prescribed Atarax 25 mg nightly as needed by PCP -- Prescribed gabapentin 200 mg nightly by PCP -- Patient scheduled for initial psychotherapy appointment with Deloris Ping, LCSW 05/23/2023   # Neurocognitive impairment 2/2 anoxic brain injury in 2022 #  Poor emotion regulation Past medication trials: Depakote (mood and agitation during 2022 hospitalization) Status of problem: chronic Interventions: -- Patient prescribed rivastigmine 3 mg BID by PM&R -- SLUMS performed 01/10/23 score of 7/30   # Cannabis use disorder Status of problem: chronic Interventions: -- Continue to monitor and promote reduction/ultimately cessation  # *** Past medication trials:  Status of problem: *** Interventions: -- ***  Patient was given contact information for behavioral health clinic and was instructed to call 911 for emergencies.   Subjective:  Chief Complaint: No chief complaint on file.   Interval History: ***   Meds Increase in zoloft Mood, irritability, si Memory Sleep - melatonin? Per Pmr taking 24mg  Seroquel increased to 100 mg by pm&r 2/19 Atarax 10 mg nightly    Visit Diagnosis: No diagnosis found.  Past Psychiatric History:  Diagnoses: none predating cardiac arrest in 2022; subsequently diagnosed with neurocognitive deficits, MDD Medication trials: Celexa (mood and agitation during 2022 hospitalization), Depakote (mood and agitation during 2022 hospitalization) Previous psychiatrist/therapist: denies Hospitalizations: denies Suicide attempts: superficial cut to wrists in 2024 but mom intervened Hx of violence towards others: denies Current access to guns: denies Hx of trauma/abuse: denies; does not have memory of hospitalization Substance use:              -- Etoh: a few times a year; 3 drinks in a sitting             -- Cannabis: all throughout the day; about 4 blunts per day and more on the weekend             -- Tobacco: denies             -- Denies  use of other illicit drugs including stimulants, benzodiazepines, opioids, hallucinogens  Past Medical History:  Past Medical History:  Diagnosis Date   Anemia 09/2015   Anoxic brain injury (HCC)    Arthritis    "knees" (04/23/2017)   Asthma    "teens; went away; came  back" (04/23/2017)   B12 deficiency anemia 04/23/2017   Cardiac arrest (HCC)    Chronic bronchitis (HCC)    Chronic low back pain with sciatica    Chronic lower back pain    Diabetes mellitus without complication (HCC)    Diabetes type 2, controlled (HCC)    Elevated ferritin level    Fatty liver    GERD (gastroesophageal reflux disease)    GERD with stricture    Headache    "1-2/wk" (04/23/2017)   History of blood transfusion "plenty"   "related to anemia" (04/23/2017)   Hypertension    Inguinal hernia    Low back pain    Migraine    "1-2/month" (04/23/2017)   OA (osteoarthritis) of knee--left    Paroxysmal atrial fibrillation (HCC)    Sub-clinical -seen on device?, elevated CHADSVASC   Symptomatic anemia 04/23/2017   Vitamin B 12 deficiency     Past Surgical History:  Procedure Laterality Date   ABDOMINAL HYSTERECTOMY  YRS AGO   COMPLETE   CESAREAN SECTION  1998; 2002   ESOPHAGEAL DILATION  02/2020   by Dr Corey Dial   INGUINAL HERNIA REPAIR Bilateral 1980s    "total of 4 surgeries" (04/23/2017)   IR GASTROSTOMY TUBE MOD SED  12/13/2020   KNEE ARTHROSCOPY WITH MEDIAL MENISECTOMY Left 01/30/2018   Procedure: LEFT KNEE ARTHROSCOPY WITH PARTIAL LATERAL MENISCECTOMY;  Surgeon: Arnie Lao, MD;  Location: [facility];  Service: Orthopedics;  Laterality: Left;   KNEE CARTILAGE SURGERY Left    SUBQ ICD IMPLANT N/A 09/13/2021   Procedure: SUBQ ICD IMPLANT;  Surgeon: Lei Pump, MD;  Location: [facility] IN[facility];  Service: Cardiovascular;  Laterality: N/A;   TUBAL LIGATION  2002    Family Psychiatric History: denies  Family History:  Family History  Problem Relation Age of Onset   Stroke Mother    Diabetes Mother    Diabetes Father    Stroke Maternal Grandmother    Diabetes Maternal Grandmother    Anemia Paternal Grandmother    Valvular heart disease Paternal Grandmother    Hypertension Mother    Prostate cancer Maternal Uncle        ? intestinal also     Social History:  Academic/Vocational: works as Copy at [employer]    Social History   Socioeconomic History   Marital status: Single    Spouse name: Not on file   Number of children: 2   Years of education: 11th   Highest education level: Not on file  Occupational History   Occupation: Scientist, product/process development in [employer]  Tobacco Use   Smoking status: Former    Types: Cigarettes   Smokeless tobacco: Never  Vaping Use   Vaping status: Never Used  Substance and Sexual Activity   Alcohol use: Yes    Comment: occ   Drug use: Yes    Types: Marijuana    Comment: daily   Sexual activity: Not Currently    Birth control/protection: Surgical  Other Topics Concern   Not on file  Social History Narrative   ** Merged History Encounter **       Lives at home with her two children. Occasional caffeine use. Right-handed.  Social Drivers of Corporate investment banker Strain: Not on file  Food Insecurity: Food Insecurity Present (06/25/2023)   Hunger Vital Sign    Worried About Running Out of Food in the Last Year: Sometimes true    Ran Out of Food in the Last Year: Sometimes true  Transportation Needs: No Transportation Needs (06/25/2023)   PRAPARE - Administrator, Civil Service (Medical): No    Lack of Transportation (Non-Medical): No  Recent Concern: Transportation Needs - Unmet Transportation Needs (05/15/2023)   PRAPARE - Transportation    Lack of Transportation (Medical): Yes    Lack of Transportation (Non-Medical): Yes  Physical Activity: Not on file  Stress: Stress Concern Present (05/15/2023)   [employer] of Occupational Health - Occupational Stress Questionnaire    Feeling of Stress : To some extent  Social Connections: Unknown (07/04/2023)   Social Connection and Isolation Panel [NHANES]    Frequency of Communication with Friends and Family: Never    Frequency of Social Gatherings with Friends and Family: Never    Attends Religious Services:  1 to 4 times per year    Active Member of [employer] or Organizations: No    Attends Banker Meetings: Never    Marital Status: Patient declined    Allergies:  Allergies  Allergen Reactions   Latex Hives   Other Other (See Comments), Anaphylaxis, Swelling and Hives    Hair glue causes throat to close and hives "glue" Hair glue causes throat to close    Codeine Nausea And Vomiting    Was on an empty stomach.   Nitrofurantoin Rash    Current Medications: Current Outpatient Medications  Medication Sig Dispense Refill   albuterol (VENTOLIN HFA) 108 (90 Base) MCG/ACT inhaler Inhale 2 puffs into the lungs every 6 (six) hours as needed for wheezing or shortness of breath. (Patient not taking: Reported on 07/10/2023) 18 g 2   apixaban (ELIQUIS) 5 MG TABS tablet Take 1 tablet (5 mg total) by mouth 2 (two) times daily. 60 tablet 5   atorvastatin (LIPITOR) 40 MG tablet Take 1.5 tablets (60 mg total) by mouth daily. 90 tablet 1   BD PEN NEEDLE NANO 2ND GEN 32G X 4 MM MISC 3 (three) times daily. as directed     carvedilol (COREG) 6.25 MG tablet Take 1 tablet (6.25 mg total) by mouth 2 (two) times daily. 180 tablet 1   gabapentin (NEURONTIN) 100 MG capsule Take 2 capsules (200 mg total) by mouth 2 (two) times daily. 120 capsule 3   hydrOXYzine (ATARAX) 10 MG tablet Take 1 tablet (10 mg total) by mouth at bedtime. 90 tablet 1   JARDIANCE 10 MG TABS tablet TAKE 1 TABLET(10 MG) BY MOUTH EVERY MORNING 90 tablet 1   losartan (COZAAR) 25 MG tablet TAKE 1 TABLET BY MOUTH DAILY 90 tablet 3   metformin (FORTAMET) 500 MG (OSM) 24 hr tablet Take 1,000 mg by mouth daily with breakfast.     OZEMPIC, 0.25 OR 0.5 MG/DOSE, 2 MG/3ML SOPN Inject 0.5 mg into the skin once a week.     QUEtiapine (SEROQUEL) 100 MG tablet Take 1 tablet (100 mg total) by mouth at bedtime. 30 tablet 3   rivastigmine (EXELON) 3 MG capsule TAKE 1 CAPSULE(3 MG) BY MOUTH TWICE DAILY 60 capsule 6   sertraline (ZOLOFT) 100 MG tablet  Take 1 tablet (100 mg total) by mouth daily. 30 tablet 2   No current facility-administered medications for this visit.  ROS: Review of Systems ***  Objective:  Psychiatric Specialty Exam: Last menstrual period 12/03/2012.There is no height or weight on file to calculate BMI.  General Appearance: Casual and Fairly Groomed  Eye Contact:  Good  Speech:  Clear and Coherent and Normal Rate  Volume:  Normal  Mood:  {BHH MOOD:22306}  Affect:  {Affect (PAA):22687}  Thought Content:  Denies AVH; no overt delusional thought content on interview     Suicidal Thoughts:   Endorses passive SI; denies active SI  Homicidal Thoughts:  No  Thought Process:  Overall linear/logical however brief and concrete at times   Orientation:  On previous exam - Oriented to person, DOB, year, season, DOW. Disoriented to month, date. ***    Memory: Impairments in short term memory   Judgment:  Fair  Insight:  Fair  Concentration:  Concentration:  Impaired although able to easily list DOWB  Recall: on previous exam of short term recall - able to recall 1/3 words   Fund of Knowledge: Fair  Language: Good  Psychomotor Activity:  Normal  Akathisia:  No  AIMS (if indicated): not done  Assets:  Communication Skills Desire for Improvement Housing Social Support Transportation Vocational/Educational  ADL's:  Intact; requires assistance with iADLs   Cognition: noted impairment - on chart review score of 7/30 on SLUMS testing 01/10/23   Sleep:  Good   PE: General: well-appearing; no acute distress *** Pulm: no increased work of breathing on room air *** Strength & Muscle Tone: {desc; muscle tone:32375} Neuro: no focal neurological deficits observed *** Gait & Station: {PE GAIT ED WUJW:11914}  Metabolic Disorder Labs: Lab Results  Component Value Date   HGBA1C 7.1 (H) 01/09/2023   MPG 157.07 01/09/2023   MPG 163 10/17/2022   No results found for: "PROLACTIN" Lab Results  Component Value Date    CHOL 157 04/25/2023   TRIG 72 04/25/2023   HDL 49 (L) 04/25/2023   CHOLHDL 3.2 04/25/2023   VLDL 17 01/09/2023   LDLCALC 92 04/25/2023   LDLCALC 104 (H) 01/09/2023   Lab Results  Component Value Date   TSH 0.44 12/06/2022   TSH 2.467 12/05/2020    Therapeutic Level Labs: No results found for: "LITHIUM" Lab Results  Component Value Date   VALPROATE 51 01/19/2021   VALPROATE 20 (L) 01/05/2021   No results found for: "CBMZ"  Screenings:  GAD-7    Flowsheet Row Counselor from 05/23/2023 in [facility]  Total GAD-7 Score 20      Mini-Mental    Flowsheet Row Office Visit from 03/01/2022 in [facility] & Adult Medicine  Total Score (max 30 points ) 10      PHQ2-9    Flowsheet Row Office Visit from 07/04/2023 in [facility] & Adult Medicine Office Visit from 06/13/2023 in [facility] Counselor from 05/23/2023 in [facility] Office Visit from 05/06/2023 in [facility] Adult Medicine Patient Outreach Telephone from 03/29/2023 in [place] [facility] DEPARTMENT  PHQ-2 Total Score 2 2 4  0 2  PHQ-9 Total Score 9 3 14  -- 9      Flowsheet Row ED from 05/05/2023 in [facility] Emergency Department at [facility] ED to Hosp-Admission (Discharged) from 01/08/2023 in [facility] ED from 08/11/2022 in [facility] Emergency Department at [facility]  C-SSRS RISK CATEGORY No Risk No Risk No  Risk       Collaboration of Care: Collaboration of Care: Medication Management AEB active medication management, Psychiatrist AEB established with this provider, and Referral or follow-up with counselor/therapist AEB scheduled for individual psychotherapy   Patient/Guardian was advised Release of Information must be obtained prior to any record release in order to collaborate their care with an outside  provider. Patient/Guardian was advised if they have not already done so to contact the registration department to sign all necessary forms in order for us  to release information regarding their care.   Consent: Patient/Guardian gives verbal consent for treatment and assignment of benefits for services provided during this visit. Patient/Guardian expressed understanding and agreed to proceed.   A total of *** minutes was spent involved in face to face clinical care, chart review, documentation, and ***.   Acelyn Basham A Marielouise Amey 07/29/2023, 8:44 AM

## 2023-07-29 NOTE — Progress Notes (Signed)
 Complex Care Management Care Guide Note  07/29/2023 Name: [name] MRN: [id number] DOB: Jun 20, 1975  Courtney Davies is a 48 y.o. year old female who is a primary care patient of Celina Colla, [name], NP and is actively engaged with the care management team. I reached out to Courtney Davies by phone today to assist with re-scheduling  with the RN Case Manager.  Follow up plan: Telephone appointment with complex care management team member scheduled for:  4/17  Barnie Bora  [facility]  [facility]Based [facility], [facility] Guide  Direct Dial: [phone]  Fax [phone]

## 2023-07-30 ENCOUNTER — Encounter (HOSPITAL_COMMUNITY): Payer: Medicare Other | Admitting: Psychiatry

## 2023-07-31 ENCOUNTER — Telehealth

## 2023-08-01 ENCOUNTER — Other Ambulatory Visit: Payer: Self-pay

## 2023-08-01 DIAGNOSIS — Z139 Encounter for screening, unspecified: Secondary | ICD-10-CM

## 2023-08-01 NOTE — Patient Outreach (Signed)
 Complex Care Management   Visit Note  08/01/2023  Name:  Courtney Davies MRN: [id number] DOB: December 26, 1975  Situation: Referral received for Complex Care Management related to SDOH Barriers:  Food insecurity Utility insecurity and memory deficits  I obtained verbal consent from Patient.  Visit completed with Cordelia Poche  on the phone  Background:   Past Medical History:  Diagnosis Date   Anemia 09/2015   Anoxic brain injury (HCC)    Arthritis    "knees" (04/23/2017)   Asthma    "teens; went away; came back" (04/23/2017)   B12 deficiency anemia 04/23/2017   Cardiac arrest (HCC)    Chronic bronchitis (HCC)    Chronic low back pain with sciatica    Chronic lower back pain    Diabetes mellitus without complication (HCC)    Diabetes type 2, controlled (HCC)    Elevated ferritin level    Fatty liver    GERD (gastroesophageal reflux disease)    GERD with stricture    Headache    "1-2/wk" (04/23/2017)   History of blood transfusion "plenty"   "related to anemia" (04/23/2017)   Hypertension    Inguinal hernia    Low back pain    Migraine    "1-2/month" (04/23/2017)   OA (osteoarthritis) of knee--left    Paroxysmal atrial fibrillation (HCC)    Sub-clinical -seen on device?, elevated CHADSVASC   Symptomatic anemia 04/23/2017   Vitamin B 12 deficiency     Assessment: Patient Reported Symptoms:  Cognitive Cognitive Status: Struggling with memory recall, Able to follow simple commands (struggling with memory recall since brain injury) Cognitive/Intellectual Conditions Management [RPT]: Brain Injury      Neurological   Neurological Conditions:  (History of anoxic brain injury and memory deficits) Neurological Management Strategies: Adequate rest, Counseling  HEENT HEENT Symptoms Reported: Not assessed      Cardiovascular Cardiovascular Symptoms Reported: No symptoms reported Does patient have uncontrolled Hypertension?: No Cardiovascular Conditions: Heart failure, Hypertension  (Defibrillator, history of cardiac arrest) Cardiovascular Management Strategies: Medication therapy  Respiratory Respiratory Symptoms Reported: No symptoms reported    Endocrine Patient reports the following symptoms related to hypoglycemia or hyperglycemia : No symptoms reported Is patient diabetic?: Yes Is patient checking blood sugars at home?: No Endocrine Conditions: Diabetes Endocrine Management Strategies: Medication therapy  Gastrointestinal Gastrointestinal Symptoms Reported: Abdominal pain or discomfort (Intermittent cramping right lower abdomen. Patient denies blood in her stool at this time. Last BM today) Gastrointestinal Conditions: Reflux/heartburn Gastrointestinal Management Strategies: Medication therapy Nutrition Risk Screen (CP): No indicators present  Genitourinary      Integumentary Integumentary Symptoms Reported: Other (Bump above her R breast that is tender when pressed on. "A little" redness around bump, no drainage. She just noticed it recently.)    Musculoskeletal Musculoskelatal Symptoms Reviewed: No symptoms reported Musculoskeletal Conditions: Back pain (Patient reports waiting on L knee replacement to be scheduled) Falls in the past year?: Yes Number of falls in past year: 1 or less Was there an injury with Fall?: No Fall Risk Category Calculator: 1 Patient Fall Risk Level: Low Fall Risk Patient at Risk for Falls Due to: History of fall(s) Fall risk Follow up: Falls evaluation completed, Education provided, Falls prevention discussed  Psychosocial Psychosocial Symptoms Reported: Anxiety - if selected complete GAD Behavioral Health Conditions: Depression Major Change/Loss/Stressor/Fears (CP): Medical condition, self Quality of Family Relationships: supportive      08/01/2023    3:50 PM  Depression screen PHQ 2/9  Decreased Interest 1  Down, Depressed, Hopeless  1  PHQ - 2 Score 2  Altered sleeping 1  Tired, decreased energy 0  Change in appetite 0   Feeling bad or failure about yourself  0  Trouble concentrating 3  Moving slowly or fidgety/restless 0  Suicidal thoughts 0  PHQ-9 Score 6    There were no vitals filed for this visit.  Medications Reviewed Today     Reviewed by Cristela Felt, RN (Registered Nurse) on 08/01/23 at 1539  Med List Status: <None>   Medication Order Taking? Sig Documenting Provider Last Dose Status Informant  albuterol (VENTOLIN HFA) 108 (90 Base) MCG/ACT inhaler [id number] No Inhale 2 puffs into the lungs every 6 (six) hours as needed for wheezing or shortness of breath.  Patient not taking: Reported on 07/10/2023   [name], PA-C Not Taking Active Self, Mother, Pharmacy Records  apixaban (ELIQUIS) 5 MG TABS tablet [id number] No Take 1 tablet (5 mg total) by mouth 2 (two) times daily. [name], NP Taking Active            Med Note Normand Sloop, JASMINE E   Tue Mar 05, 2023  1:11 PM)    atorvastatin (LIPITOR) 40 MG tablet [id number] No Take 1.5 tablets (60 mg total) by mouth daily. [name], NP Taking Active   BD PEN NEEDLE NANO 2ND GEN 32G X 4 MM MISC [id number] No 3 (three) times daily. as directed [provider] Taking Active Self, Mother, Pharmacy Records  carvedilol (COREG) 6.25 MG tablet [id number] No Take 1 tablet (6.25 mg total) by mouth 2 (two) times daily. [name], NP Taking Active   gabapentin (NEURONTIN) 100 MG capsule [id number] No Take 2 capsules (200 mg total) by mouth 2 (two) times daily. [name], [name], NP Taking Active   hydrOXYzine (ATARAX) 10 MG tablet [id number] No Take 1 tablet (10 mg total) by mouth at bedtime. [name], [name], NP Taking Active   JARDIANCE 10 MG TABS tablet [id number] No TAKE 1 TABLET(10 MG) BY MOUTH EVERY MORNING [name], [name], NP Taking Active   losartan (COZAAR) 25 MG tablet [id number] No TAKE 1 TABLET BY MOUTH DAILY [name], [name], NP Taking Active   metformin (FORTAMET) 500 MG (OSM) 24 hr tablet [id number] No Take 1,000 mg by mouth daily with  breakfast. [provider] Taking Active Self, Mother, Pharmacy Records  OZEMPIC, 0.25 OR 0.5 MG/DOSE, 2 MG/3ML SOPN [id number] No Inject 0.5 mg into the skin once a week. [provider] Taking Active Self, Mother, Pharmacy Records  QUEtiapine (SEROQUEL) 100 MG tablet [id number] No Take 1 tablet (100 mg total) by mouth at bedtime. [name], MD Taking Active   rivastigmine (EXELON) 3 MG capsule [id number] No TAKE 1 CAPSULE(3 MG) BY MOUTH TWICE DAILY [name], MD Taking Active Self, Mother, Pharmacy Records  sertraline (ZOLOFT) 100 MG tablet [id number] No Take 1 tablet (100 mg total) by mouth daily. Daine Gip Taking Active             Recommendation:   Referral to: community resource care guide Call behavioral health to confirm appointment date/time/location at [phone]  Follow Up Plan:   Telephone follow up appointment date/time:  08/29/23 at 3:30 PM  Ewing Schlein, RN MSN [place]  [facility] RN Care Manager Direct Dial: [phone]  Fax: [phone]

## 2023-08-01 NOTE — Patient Instructions (Signed)
 Visit Information  Thank you for taking time to visit with me today. Please don't hesitate to contact me if I can be of assistance to you before our next scheduled appointment.  Your next care management appointment is by telephone on 08/29/23 at 3:30 PM with Louanne Roussel   Please call the care guide team at [phone] if you need to cancel, schedule, or reschedule an appointment.   Please call the Suicide and Crisis Lifeline: 988 call 1-800-273-TALK (toll free, 24 hour hotline) if you are experiencing a Mental Health or Behavioral Health Crisis or need someone to talk to.  Theodora Fish, RN MSN [place]  [facility] Population Health RN Care Manager Direct Dial: [phone]  Fax: [phone]

## 2023-08-02 ENCOUNTER — Telehealth: Payer: Self-pay

## 2023-08-02 NOTE — Progress Notes (Signed)
   Telephone encounter was:  Successful.  Complex Care Management Note Care Guide Note  08/02/2023 Name: [name] MRN: [id number] DOB: 1976-03-10  Courtney Davies [place] is a 48 y.o. year old female who is a primary care patient of [name], [name], NP . The community resource team was consulted for assistance with Food Insecurity and Financial Difficulties related to financial strain  SDOH screenings and interventions completed:  Yes  Social Drivers of Health From This Encounter   Financial Resource Strain: High Risk (08/02/2023)   Overall Financial Resource Strain (CARDIA)    Difficulty of Paying Living Expenses: Very hard  Utilities: At Risk (08/02/2023)   Utilities    Threatened with loss of utilities: Yes    SDOH Interventions Today    Flowsheet Row Most Recent Value  SDOH Interventions   Utilities Interventions Community Resources Provided, [id number] Referral  Financial Strain Interventions [email or web address] Referral, Community Resources Provided        Care guide performed the following interventions: Patient provided with information about care guide support team and interviewed to confirm resource needs.Pt stated she needs assistance with her Utilities. I gave her information over the phone, added referrals in [place] [facility] and I will be emailing resources as requested   Follow Up Plan:  No further follow up planned at this time. The patient has been provided with needed resources.  Encounter Outcome:  Patient Visit Completed    Jon Colt [facility]  [facility] Guide, Phone: [phone] Fax: [phone] Website: [email or web address]

## 2023-08-06 ENCOUNTER — Other Ambulatory Visit: Payer: Self-pay | Admitting: Physical Medicine & Rehabilitation

## 2023-08-06 DIAGNOSIS — G931 Anoxic brain damage, not elsewhere classified: Secondary | ICD-10-CM

## 2023-08-17 ENCOUNTER — Other Ambulatory Visit (HOSPITAL_COMMUNITY): Payer: Self-pay | Admitting: Psychiatry

## 2023-08-17 DIAGNOSIS — F329 Major depressive disorder, single episode, unspecified: Secondary | ICD-10-CM

## 2023-08-22 ENCOUNTER — Ambulatory Visit (INDEPENDENT_AMBULATORY_CARE_PROVIDER_SITE_OTHER): Admitting: Orthopedic Surgery

## 2023-08-22 ENCOUNTER — Ambulatory Visit: Payer: Self-pay | Admitting: Orthopedic Surgery

## 2023-08-22 ENCOUNTER — Other Ambulatory Visit: Payer: Self-pay | Admitting: Orthopedic Surgery

## 2023-08-22 ENCOUNTER — Encounter: Payer: Self-pay | Admitting: Orthopedic Surgery

## 2023-08-22 VITALS — BP 110/70 | HR 85 | Temp 97.4°F | Resp 16 | Ht 65.0 in | Wt 200.8 lb

## 2023-08-22 DIAGNOSIS — R634 Abnormal weight loss: Secondary | ICD-10-CM

## 2023-08-22 DIAGNOSIS — G8929 Other chronic pain: Secondary | ICD-10-CM

## 2023-08-22 DIAGNOSIS — B3731 Acute candidiasis of vulva and vagina: Secondary | ICD-10-CM

## 2023-08-22 DIAGNOSIS — M25562 Pain in left knee: Secondary | ICD-10-CM | POA: Diagnosis not present

## 2023-08-22 DIAGNOSIS — F5105 Insomnia due to other mental disorder: Secondary | ICD-10-CM

## 2023-08-22 MED ORDER — FLUCONAZOLE 150 MG PO TABS: 150.0000 mg | ORAL_TABLET | ORAL | 0 refills | Status: AC

## 2023-08-22 NOTE — Patient Instructions (Addendum)
 Call Dr. Emelie Hanger [phone]> ask about new jobs and training sessions  Please call [facility] and give us  fax # for FMLA paperwork OR Mychart message me

## 2023-08-23 DIAGNOSIS — E538 Deficiency of other specified B group vitamins: Secondary | ICD-10-CM | POA: Diagnosis not present

## 2023-08-23 NOTE — Progress Notes (Signed)
 Careteam: Patient Care Team: Arnetha Bhat, NP as PCP - General (Adult Health Nurse Practitioner) Lucendia Rusk, MD as PCP - Cardiology (Cardiology) Lei Pump, MD as PCP - Electrophysiology (Cardiology) Chancey Combe, [name], PA-C (Family Medicine) Randye Buttner, RN as Registered Nurse Ellie Gutta Care Management  Seen by: Ulyses Gandy, AGNP-C  PLACE OF SERVICE:  [facility]  Advanced Directive information    Allergies  Allergen Reactions   Latex Hives   Other Other (See Comments), Anaphylaxis, Swelling and Hives    Hair glue causes throat to close and hives "glue" Hair glue causes throat to close    Codeine  Nausea And Vomiting    Was on an empty stomach.   Nitrofurantoin Rash    Chief Complaint  Patient presents with   Form Completion    Patient states she needs FMLA papers completed for her job.     HPI: Patient is a 48 y.o. female seen today for acute visit due to left knee pain.   Followed by orthopedics, Dr. Christiane Cowing, due to ongoing left knee pain. She is scheduled for elective partial left knee replacement 10/03/2023. She currently works in Public affairs consultant at [employer]. She is having trouble ambulating long distances ( > 500 ft) from where she parks to where she reports to work. Her left knee is often throbbing in pain by the time she gets to her job. She has discussed issues with management. They are requesting a statement from me regarding her medical condition in order to accommodate closer parking. She has also brought FMLA paperwork for upcoming surgery.    Increased vaginal itching x 2 days. Admits to some discharge.    Recent weight trends: remains on Ozempic> managed by endocrinology  08/22/2023- 200 lbs  03/31/2023- 222 lbs  06/07/2022- 252 lbs   Review of Systems:  Review of Systems  Constitutional: Negative.   HENT: Negative.    Respiratory: Negative.    Cardiovascular: Negative.    Gastrointestinal: Negative.   Genitourinary: Negative.   Musculoskeletal:  Positive for joint pain. Negative for falls.  Skin: Negative.   Neurological: Negative.   Psychiatric/Behavioral:  Positive for memory loss. Negative for depression. The patient is not nervous/anxious and does not have insomnia.     Past Medical History:  Diagnosis Date   Anemia 09/2015   Anoxic brain injury (HCC)    Arthritis    "knees" (04/23/2017)   Asthma    "teens; went away; came back" (04/23/2017)   B12 deficiency anemia 04/23/2017   Cardiac arrest (HCC)    Chronic bronchitis (HCC)    Chronic low back pain with sciatica    Chronic lower back pain    Diabetes mellitus without complication (HCC)    Diabetes type 2, controlled (HCC)    Elevated ferritin level    Fatty liver    GERD (gastroesophageal reflux disease)    GERD with stricture    Headache    "1-2/wk" (04/23/2017)   History of blood transfusion "plenty"   "related to anemia" (04/23/2017)   Hypertension    Inguinal hernia    Low back pain    Migraine    "1-2/month" (04/23/2017)   OA (osteoarthritis) of knee--left    Paroxysmal atrial fibrillation (HCC)    Sub-clinical -seen on device?, elevated CHADSVASC   Symptomatic anemia 04/23/2017   Vitamin B 12 deficiency    Past Surgical History:  Procedure Laterality Date   ABDOMINAL HYSTERECTOMY  YRS AGO  COMPLETE   CESAREAN SECTION  1998; 2002   ESOPHAGEAL DILATION  02/2020   by Dr Linna Richard HERNIA REPAIR Bilateral 1980s    "total of 4 surgeries" (04/23/2017)   IR GASTROSTOMY TUBE MOD SED  12/13/2020   KNEE ARTHROSCOPY WITH MEDIAL MENISECTOMY Left 01/30/2018   Procedure: LEFT KNEE ARTHROSCOPY WITH PARTIAL LATERAL MENISCECTOMY;  Surgeon: Arnie Lao, MD;  Location: [facility];  Service: Orthopedics;  Laterality: Left;   KNEE CARTILAGE SURGERY Left    SUBQ ICD IMPLANT N/A 09/13/2021   Procedure: SUBQ ICD IMPLANT;  Surgeon: Lei Pump, MD;  [name] INVAS[facility] CV  [facility];  Service: Cardiovascular;  Laterality: N/A;   TUBAL LIGATION  2002   Social History:   reports that she has quit smoking. Her smoking use included cigarettes. She has never used smokeless tobacco. She reports current alcohol use. She reports current drug use. Drug: Marijuana.  Family History  Problem Relation Age of Onset   Stroke Mother    Diabetes Mother    Diabetes Father    Stroke Maternal Grandmother    Diabetes Maternal Grandmother    Anemia Paternal Grandmother    Valvular heart disease Paternal Grandmother    Hypertension Mother    Prostate cancer Maternal Uncle        ? intestinal also    Medications: Patient's Medications  New Prescriptions   FLUCONAZOLE  (DIFLUCAN ) 150 MG TABLET    Take 1 tablet (150 mg total) by mouth every 3 (three) days for 2 doses.  Previous Medications   ALBUTEROL  (VENTOLIN  HFA) 108 (90 BASE) MCG/ACT INHALER    Inhale 2 puffs into the lungs every 6 (six) hours as needed for wheezing or shortness of breath.   APIXABAN  (ELIQUIS ) 5 MG TABS TABLET    Take 1 tablet (5 mg total) by mouth 2 (two) times daily.   ATORVASTATIN  (LIPITOR) 40 MG TABLET    Take 1.5 tablets (60 mg total) by mouth daily.   BD PEN NEEDLE NANO 2ND GEN 32G X 4 MM MISC    3 (three) times daily. as directed   CARVEDILOL  (COREG ) 6.25 MG TABLET    Take 1 tablet (6.25 mg total) by mouth 2 (two) times daily.   GABAPENTIN  (NEURONTIN ) 100 MG CAPSULE    Take 2 capsules (200 mg total) by mouth 2 (two) times daily.   HYDROXYZINE  (ATARAX ) 10 MG TABLET    Take 1 tablet (10 mg total) by mouth at bedtime.   JARDIANCE  10 MG TABS TABLET    TAKE 1 TABLET(10 MG) BY MOUTH EVERY MORNING   LOSARTAN  (COZAAR ) 25 MG TABLET    TAKE 1 TABLET BY MOUTH DAILY   METFORMIN (FORTAMET) 500 MG (OSM) 24 HR TABLET    Take 1,000 mg by mouth daily with breakfast.   OZEMPIC, 0.25 OR 0.5 MG/DOSE, 2 MG/3ML SOPN    Inject 0.5 mg into the skin once a week.   QUETIAPINE  (SEROQUEL ) 100 MG TABLET    Take 1 tablet (100 mg  total) by mouth at bedtime.   RIVASTIGMINE  (EXELON ) 3 MG CAPSULE    TAKE 1 CAPSULE(3 MG) BY MOUTH TWICE DAILY   SERTRALINE  (ZOLOFT ) 100 MG TABLET    TAKE 1 TABLET(100 MG) BY MOUTH DAILY  Modified Medications   No medications on file  Discontinued Medications   No medications on file    Physical Exam:  Vitals:   08/22/23 1523  BP: 110/70  Pulse: 85  Resp: 16  Temp: (!) 97.4  F (36.3 C)  SpO2: 99%  Weight: 200 lb 12.8 oz (91.1 kg)  Height: 5\' 5"  (1.651 m)   Body mass index is 33.41 kg/m. Wt Readings from Last 3 Encounters:  08/22/23 200 lb 12.8 oz (91.1 kg)  07/12/23 205 lb 12.8 oz (93.4 kg)  07/08/23 206 lb (93.4 kg)    Physical Exam Vitals reviewed.  Constitutional:      General: She is not in acute distress. HENT:     Head: Normocephalic.  Eyes:     General:        Right eye: No discharge.        Left eye: No discharge.  Cardiovascular:     Rate and Rhythm: Normal rate and regular rhythm.     Pulses: Normal pulses.     Heart sounds: Normal heart sounds.  Pulmonary:     Effort: Pulmonary effort is normal.     Breath sounds: Normal breath sounds.  Abdominal:     General: Bowel sounds are normal. There is no distension.     Palpations: Abdomen is soft.     Tenderness: There is no abdominal tenderness.  Musculoskeletal:     Cervical back: Neck supple.     Right lower leg: No edema.     Left lower leg: No edema.  Skin:    General: Skin is warm.     Capillary Refill: Capillary refill takes less than 2 seconds.  Neurological:     General: No focal deficit present.     Mental Status: She is alert. Mental status is at baseline.     Gait: Gait abnormal.  Psychiatric:        Mood and Affect: Mood normal.     Labs reviewed: Basic Metabolic Panel: Recent Labs    12/06/22 1052 01/08/23 1117 01/10/23 1115 03/05/23 1415 06/13/23 1552  NA 136   < > 137 139 138  K 4.1   < > 4.0 4.0 4.1  CL 104   < > 109 106 106  CO2 21   < > 19* 26 26  GLUCOSE 127   < >  104* 106* 149*  BUN 11   < > 13 13 12   CREATININE 0.91   < > 0.83 0.70 0.74  CALCIUM  9.1   < > 9.2 9.3 9.4  TSH 0.44  --   --   --   --    < > = values in this interval not displayed.   Liver Function Tests: Recent Labs    12/06/22 1052 01/08/23 1117 06/13/23 1552  AST 14 22 14   ALT 10 19 18   ALKPHOS  --  97  --   BILITOT 0.2 0.4 0.3  PROT 7.1 7.0 7.1  ALBUMIN  --  3.6  --    Recent Labs    03/05/23 1415  LIPASE 29   No results for input(s): "AMMONIA" in the last 8760 hours. CBC: Recent Labs    03/05/23 1415 06/13/23 1552 07/12/23 1618  WBC 6.4 8.1 7.1  NEUTROABS 2,938 4,082 3,110  HGB 13.6 13.6 12.9  HCT 46.3* 45.5* 43.6  MCV 74.4* 73.3* 74.3*  PLT 298 281 281   Lipid Panel: Recent Labs    01/09/23 0454 04/25/23 0853  CHOL 159 157  HDL 38* 49*  LDLCALC 104* 92  TRIG 85 72  CHOLHDL 4.2 3.2   TSH: Recent Labs    12/06/22 1052  TSH 0.44   A1C: Lab Results  Component Value Date  HGBA1C 7.1 (H) 01/09/2023     Assessment/Plan 1. Chronic pain of left knee (Primary) - ongoing - followed by Dr. Christiane Cowing - scheduled partial knee replacement 06/19 - increased pain ambulating > 500 ft - will write letter for work for closer parking & FMLA - not taking pain medication at this tim - cont ice and rest with increased pain  2. Vaginal yeast infection - onset x 2 days - some itching/discharge per patient - fluconazole  (DIFLUCAN ) 150 MG tablet; Take 1 tablet (150 mg total) by mouth every 3 (three) days for 2 doses.  Dispense: 2 tablet; Refill: 0  3. Weight loss - BMI 33.41 - lost 50 lbs in 1 year - cont Ozempic - suspect increased weight loss once left knee pain repaired   Total time: 32 minutes. Greater than 50% of total time spent doing patient education regarding left knee pain, vaginal itching and weight loss including symptom/medication interventions.     Next appt: 08/22/2023  Ulyses Gandy, Joyice Nodal  [facility] [phone]

## 2023-08-26 NOTE — Progress Notes (Signed)
 Remote ICD transmission.

## 2023-08-29 ENCOUNTER — Telehealth: Payer: Self-pay | Admitting: Neurology

## 2023-08-29 ENCOUNTER — Telehealth: Payer: Self-pay

## 2023-08-29 NOTE — Telephone Encounter (Signed)
Patient called to verify appointment 

## 2023-08-29 NOTE — Telephone Encounter (Signed)
 I called patient to request fax number for [facility] paperwork. Patient didn't answer the phone so voicemail was left.

## 2023-09-02 ENCOUNTER — Emergency Department (HOSPITAL_BASED_OUTPATIENT_CLINIC_OR_DEPARTMENT_OTHER): Admission: EM | Admit: 2023-09-02 | Discharge: 2023-09-02 | Discharge status: Against Medical Advice

## 2023-09-02 ENCOUNTER — Other Ambulatory Visit: Payer: Self-pay

## 2023-09-02 ENCOUNTER — Other Ambulatory Visit: Payer: Self-pay | Admitting: Orthopedic Surgery

## 2023-09-02 ENCOUNTER — Encounter: Payer: Self-pay | Admitting: Neurology

## 2023-09-02 ENCOUNTER — Inpatient Hospital Stay: Payer: Medicare Other | Admitting: Neurology

## 2023-09-02 DIAGNOSIS — I639 Cerebral infarction, unspecified: Secondary | ICD-10-CM

## 2023-09-02 DIAGNOSIS — E1169 Type 2 diabetes mellitus with other specified complication: Secondary | ICD-10-CM

## 2023-09-02 NOTE — Telephone Encounter (Signed)
 Called and spoke with patient and she is going to get the Fax number and call us  back with it so we can fax the [facility] Paperwork.

## 2023-09-02 NOTE — ED Notes (Signed)
 Called 3x for triage with no response

## 2023-09-03 ENCOUNTER — Telehealth: Payer: Self-pay

## 2023-09-03 ENCOUNTER — Other Ambulatory Visit: Payer: Self-pay | Admitting: Orthopedic Surgery

## 2023-09-03 NOTE — Telephone Encounter (Signed)
 Patient has a h/o cardiac arrest and CVA. She needs to be on a high intensity statin like this one. Can we call to ask if Crestor is covered as substitute. If not, we will need to start appeal process.

## 2023-09-03 NOTE — Telephone Encounter (Signed)
 MyChart message sent to patient.

## 2023-09-03 NOTE — Telephone Encounter (Signed)
 Incoming fax received stating Atorvastatin  is not a covered drug, see document under Media Tab

## 2023-09-04 ENCOUNTER — Other Ambulatory Visit: Payer: Self-pay | Admitting: Physical Medicine & Rehabilitation

## 2023-09-04 DIAGNOSIS — G479 Sleep disorder, unspecified: Secondary | ICD-10-CM

## 2023-09-04 DIAGNOSIS — G931 Anoxic brain damage, not elsewhere classified: Secondary | ICD-10-CM

## 2023-09-06 ENCOUNTER — Telehealth: Payer: Self-pay

## 2023-09-06 NOTE — Telephone Encounter (Signed)
 Copied from CRM [id number]. Topic: General - Other >> Sep 06, 2023  2:42 PM Retta Caster wrote: Reason for CRM: Patient Request to fax over FMLA paperwork to her work Fax: [phone]. Need call back [phone]  Message sent to Arnetha Bhat, NP Waymond Hailey, Craige Dixon, NP  who is cover her in basket)

## 2023-09-11 ENCOUNTER — Telehealth: Payer: Self-pay

## 2023-09-11 NOTE — Progress Notes (Signed)
 Complex Care Management Care Guide Note  09/11/2023 Name: Courtney Davies MRN: [id number] DOB: 07-31-1975  Courtney Davies is a 48 y.o. year old female who is a primary care patient of Celina Colla, [name], NP and is actively engaged with the care management team. I reached out to Courtney Davies by phone today to assist with re-scheduling  with the RN Case Manager.  Follow up plan: Unsuccessful telephone outreach attempt made. A HIPAA compliant phone message was left for the patient providing contact information and requesting a return call.  [name] [facility], [facility] Guide  Direct Dial: [phone]  Fax [phone]

## 2023-09-11 NOTE — Progress Notes (Signed)
 Reschedule with Louanne Roussel RNCM for missed follow up   Barnie Bora  [facility], [facility] Guide  Direct Dial: [phone]  Fax [phone]

## 2023-09-12 ENCOUNTER — Encounter (HOSPITAL_COMMUNITY): Payer: Self-pay

## 2023-09-12 NOTE — Telephone Encounter (Signed)
 I called patient and she stated that she wants to HOLD OFF on sending the Paperwork. She stated that she is going to get the Dates changed because she has to be at her job for a year before she can file FLMA.  The FMLA Paperwork dates were for 10/03/2023-10/06/2023 for inpatient care.   Paperwork is on HOLD at the clinical intake desk.  Patient will have new paperwork sent once she gets new dates.

## 2023-09-12 NOTE — Telephone Encounter (Signed)
 Hobart Lulas, CMA    09/06/23  3:43 PM Note Copied from CRM #[id number]. Topic: General - Other >> Courtney Davies 23, 2025  2:42 PM Courtney Davies wrote: Reason for CRM: Patient Request to fax over FMLA paperwork to her work Fax: [phone]. Need call back [phone]   Message sent to Arnetha Bhat, NP Waymond Hailey, Craige Dixon, NP  who is cover her in basket)

## 2023-09-17 ENCOUNTER — Telehealth: Payer: Self-pay

## 2023-09-17 NOTE — Progress Notes (Signed)
 Complex Care Management Care Guide Note  09/17/2023 Name: Courtney Davies MRN: [id number] DOB: 17-Jan-1976  Courtney Davies is a 48 y.o. year old female who is a primary care patient of Celina Colla, [name], NP and is actively engaged with the care management team. I reached out to Courtney Davies by phone today to assist with re-scheduling  with the RN Case Manager.  Follow up plan: Second Unsuccessful telephone outreach attempt made. A HIPAA compliant phone message was left for the patient providing contact information and requesting a return call.  [name] [facility], [facility] Guide  Direct Dial: [phone]  Fax [phone]

## 2023-09-20 DIAGNOSIS — E538 Deficiency of other specified B group vitamins: Secondary | ICD-10-CM | POA: Diagnosis not present

## 2023-09-21 ENCOUNTER — Other Ambulatory Visit: Payer: Self-pay | Admitting: Orthopedic Surgery

## 2023-09-21 DIAGNOSIS — I639 Cerebral infarction, unspecified: Secondary | ICD-10-CM

## 2023-09-24 ENCOUNTER — Other Ambulatory Visit: Payer: Self-pay

## 2023-09-24 ENCOUNTER — Emergency Department (HOSPITAL_BASED_OUTPATIENT_CLINIC_OR_DEPARTMENT_OTHER)
Admission: EM | Admit: 2023-09-24 | Discharge: 2023-09-24 | Disposition: A | Discharge status: Home / Self Care | Attending: Emergency Medicine | Admitting: Emergency Medicine

## 2023-09-24 ENCOUNTER — Encounter (HOSPITAL_BASED_OUTPATIENT_CLINIC_OR_DEPARTMENT_OTHER): Payer: Self-pay | Admitting: Emergency Medicine

## 2023-09-24 DIAGNOSIS — Z7901 Long term (current) use of anticoagulants: Secondary | ICD-10-CM | POA: Diagnosis not present

## 2023-09-24 DIAGNOSIS — Z91040 Latex allergy status: Secondary | ICD-10-CM | POA: Diagnosis not present

## 2023-09-24 DIAGNOSIS — R300 Dysuria: Secondary | ICD-10-CM | POA: Insufficient documentation

## 2023-09-24 LAB — URINALYSIS, MICROSCOPIC (REFLEX)

## 2023-09-24 LAB — URINALYSIS, ROUTINE W REFLEX MICROSCOPIC
Bilirubin Urine: NEGATIVE
Glucose, UA: >=500 mg/dL — AB
Hgb urine dipstick: NEGATIVE
Ketones, ur: NEGATIVE mg/dL
Leukocytes,Ua: NEGATIVE
Nitrite: NEGATIVE
Protein, ur: NEGATIVE mg/dL
Specific Gravity, Urine: 1.02 (ref 1.005–1.030)
pH: 7 (ref 5.0–8.0)

## 2023-09-24 MED ORDER — POLYETHYLENE GLYCOL 3350 17 G PO PACK: 17.0000 g | PACK | Freq: Every day | ORAL | 0 refills | Status: DC

## 2023-09-24 MED ORDER — MAALOX MAX 400-400-40 MG/5ML PO SUSP: 5.0000 mL | Freq: Four times a day (QID) | ORAL | 0 refills | Status: DC | PRN

## 2023-09-24 NOTE — ED Notes (Signed)
 Reviewed discharge instructions , follow up and medications with pt. Pt states understanding

## 2023-09-24 NOTE — ED Triage Notes (Addendum)
 Dysuria and cloudy urine ongoing x 1 week. Hx of recurrent uti's. Concerned for same. Denies vaginal issues.

## 2023-09-24 NOTE — ED Provider Notes (Signed)
 [facility] Provider Note   CSN: [id number] Arrival date & time: 09/24/23  1557     History  Chief Complaint  Patient presents with   Dysuria    Courtney Davies is a 48 y.o. female.  With a history of recurrent UTIs and diabetes who presents to the ED for dysuria.  1 week of dysuria, cloudy urine foul-smelling urine.  No hematuria.  No vaginal bleeding vaginal discharge.  Was seen by her dentist earlier today started on antibiotics for 4 odontogenic infection.  Secondary complaints of recent constipation and recent heartburn.  No fevers chills or other complaints at this time   Dysuria      Home Medications Prior to Admission medications   Medication Sig Start Date End Date Taking? Authorizing Provider  alum & mag hydroxide-simeth (MAALOX MAX) 400-400-40 MG/5ML suspension Take 5 mLs by mouth every 6 (six) hours as needed for indigestion. 09/24/23  Yes Rafael Bun A, DO  polyethylene glycol (MIRALAX ) 17 g packet Take 17 g by mouth daily. 09/24/23  Yes Sallyanne Creamer, DO  albuterol  (VENTOLIN  HFA) 108 (90 Base) MCG/ACT inhaler Inhale 2 puffs into the lungs every 6 (six) hours as needed for wheezing or shortness of breath. 01/24/21   Love, Renay Carota, PA-C  atorvastatin  (LIPITOR) 40 MG tablet TAKE 1 AND 1/2 TABLETS(60 MG) BY MOUTH DAILY 09/02/23   [name], [name], NP  BD PEN NEEDLE NANO 2ND GEN 32G X 4 MM MISC 3 (three) times daily. as directed 07/11/21   [provider]  carvedilol  (COREG ) 6.25 MG tablet Take 1 tablet (6.25 mg total) by mouth 2 (two) times daily. 07/11/23   [name], [name], NP  ELIQUIS  5 MG TABS tablet TAKE 1 TABLET(5 MG) BY MOUTH TWICE DAILY 09/23/23   [name], [name], NP  gabapentin  (NEURONTIN ) 100 MG capsule Take 2 capsules (200 mg total) by mouth 2 (two) times daily. 06/13/23   [name], [name], NP  hydrOXYzine  (ATARAX ) 10 MG tablet Take 1 tablet (10 mg total) by mouth at bedtime. 07/04/23   [name], [name], NP  JARDIANCE  10 MG  TABS tablet TAKE 1 TABLET(10 MG) BY MOUTH EVERY MORNING 03/08/23   [name], [name], NP  losartan  (COZAAR ) 25 MG tablet TAKE 1 TABLET BY MOUTH DAILY 01/29/23   [name], [name], NP  metformin (FORTAMET) 500 MG (OSM) 24 hr tablet Take 1,000 mg by mouth daily with breakfast.    [provider]  OZEMPIC, 0.25 OR 0.5 MG/DOSE, 2 MG/3ML SOPN Inject 0.5 mg into the skin once a week. 01/06/23   [provider]  QUEtiapine  (SEROQUEL ) 100 MG tablet TAKE 1 TABLET(100 MG) BY MOUTH AT BEDTIME 09/04/23   Rawland Caddy, MD  rivastigmine  (EXELON ) 3 MG capsule TAKE 1 CAPSULE(3 MG) BY MOUTH TWICE DAILY 08/09/23   Rawland Caddy, MD  sertraline  (ZOLOFT ) 100 MG tablet TAKE 1 TABLET(100 MG) BY MOUTH DAILY 08/20/23   Bahraini, Sarah A      Allergies    Latex, Other, Codeine , and Nitrofurantoin    Review of Systems   Review of Systems  Genitourinary:  Positive for dysuria.    Physical Exam Updated Vital Signs BP (!) 111/91 (BP Location: Left Arm)   Pulse 76   Temp 98.1 F (36.7 C) (Oral)   Resp 17   Ht 5' 5.5" (1.664 m)   Wt 93 kg   LMP 12/03/2012   SpO2 100%   BMI 33.59 kg/m  Physical Exam Vitals and nursing note reviewed.  HENT:     Head: Normocephalic and atraumatic.  Eyes:     Pupils: Pupils are equal, round, and reactive to light.  Cardiovascular:     Rate and Rhythm: Normal rate and regular rhythm.  Pulmonary:     Effort: Pulmonary effort is normal.     Breath sounds: Normal breath sounds.  Abdominal:     Palpations: Abdomen is soft.     Tenderness: There is no abdominal tenderness.  Skin:    General: Skin is warm and dry.  Neurological:     Mental Status: She is alert.  Psychiatric:        Mood and Affect: Mood normal.     ED Results / Procedures / Treatments   Labs (all labs ordered are listed, but only abnormal results are displayed) Labs Reviewed  URINALYSIS, ROUTINE W REFLEX MICROSCOPIC - Abnormal; Notable for the following components:      Result Value    Glucose, UA >=500 (*)    All other components within normal limits  URINALYSIS, MICROSCOPIC (REFLEX) - Abnormal; Notable for the following components:   Bacteria, UA RARE (*)    All other components within normal limits  URINE CULTURE    EKG None  Radiology No results found.  Procedures Procedures    Medications Ordered in ED Medications - No data to display  ED Course/ Medical Decision Making/ A&P                                 Medical Decision Making 48 year old female with history as above presenting for complaint of dysuria.  History of UTIs.  Dysuria foul-smelling urine for the last week.  No fevers chills or systemic signs of illness.  Was started on antibiotics for an odontogenic infection today.  UA shows rare bacteria without leukocytes, red cells or nitrites.  Low suspicion for UTI but will need to send for culture.  She will be started on antibiotics for odontogenic infection.  Given relatively benign appearance of UA we will hold off on giving her an additional antibiotic as the risk of GI distress outweigh the benefits.  Will await results of urine culture.  She will follow-up with her primary care doctor and dental team.  Will give MiraLAX  for constipation and MiraLAX  for heartburn  Amount and/or Complexity of Data Reviewed Labs: ordered.           Final Clinical Impression(s) / ED Diagnoses Final diagnoses:  Dysuria    Rx / DC Orders ED Discharge Orders          Ordered    alum & mag hydroxide-simeth (MAALOX MAX) 400-400-40 MG/5ML suspension  Every 6 hours PRN        09/24/23 1719    polyethylene glycol (MIRALAX ) 17 g packet  Daily        09/24/23 1719              Sallyanne Creamer, DO 09/24/23 1719

## 2023-09-24 NOTE — ED Notes (Signed)
Lab aware of urine culture order.

## 2023-09-24 NOTE — Discharge Instructions (Addendum)
 You were seen in the Emergency Department for urinary symptoms The urine test did not show any evidence of urinary tract infection but we need to send it for culture to be sure You should pick up the antibiotic prescribed by your dentist and begin taking as directed If your urine does show an infection based on the culture results we will call you and start an appropriate antibiotic We have called in a prescription for MiraLAX  to help with your constipation as well as MiraLAX  to help with the heartburn Pick up these medications from your pharmacy and take as directed Follow-up with your primary care doctor and dentist

## 2023-09-25 LAB — URINE CULTURE: Culture: 10000 — AB

## 2023-09-26 ENCOUNTER — Telehealth (HOSPITAL_BASED_OUTPATIENT_CLINIC_OR_DEPARTMENT_OTHER): Payer: Self-pay | Admitting: *Deleted

## 2023-09-26 ENCOUNTER — Other Ambulatory Visit: Payer: Self-pay | Admitting: Orthopedic Surgery

## 2023-09-26 ENCOUNTER — Encounter: Payer: Self-pay | Admitting: Diagnostic Neuroimaging

## 2023-09-26 ENCOUNTER — Ambulatory Visit: Admitting: Diagnostic Neuroimaging

## 2023-09-26 VITALS — BP 128/78 | HR 71 | Ht 65.0 in | Wt 198.0 lb

## 2023-09-26 DIAGNOSIS — I639 Cerebral infarction, unspecified: Secondary | ICD-10-CM

## 2023-09-26 DIAGNOSIS — G459 Transient cerebral ischemic attack, unspecified: Secondary | ICD-10-CM | POA: Diagnosis not present

## 2023-09-26 DIAGNOSIS — E119 Type 2 diabetes mellitus without complications: Secondary | ICD-10-CM | POA: Diagnosis not present

## 2023-09-26 DIAGNOSIS — E1169 Type 2 diabetes mellitus with other specified complication: Secondary | ICD-10-CM

## 2023-09-26 MED ORDER — ROSUVASTATIN CALCIUM 20 MG PO TABS
30.0000 mg | ORAL_TABLET | Freq: Every day | ORAL | 3 refills | Status: AC
Start: 2023-09-26 — End: ?

## 2023-09-26 NOTE — Telephone Encounter (Signed)
 Post ED Visit - Positive Culture Follow-up  Culture report reviewed by antimicrobial stewardship pharmacist: [facility] [address], [place].D. []  Skeet Duke, Pharm.D., BCPS AQ-ID []  Leslee Rase, Pharm.D., BCPS []  Garland Junk, Pharm.D., BCPS []  [place], [address].D., BCPS, AAHIVP []  Alcide Aly, Pharm.D., BCPS, AAHIVP []  Jerri Morale, PharmD, BCPS []  Graham Laws, PharmD, BCPS []  Cleda Curly, PharmD, BCPS []  Tamar Fairly, PharmD []  Ballard Levels, PharmD, BCPS []  Ollen Beverage, PharmD  [facility] []  Arlyne Bering, PharmD []  Sherryle Don, PharmD []  Van Gelinas, PharmD []  Delila Felty, Rph []  Luna Salinas) Cleora Daft, PharmD []  Augustina Block, PharmD []  Arie Kurtz, PharmD []  Sharlyn Deaner, PharmD []  Agnes Hose, PharmD []  Kendall Pauls, PharmD []  Gladstone Lamer, PharmD []  Armanda Bern, PharmD []  Tera Fellows, PharmD   Positive urine culture Treated with amoxicillin for odontogenic infection, organism sensitive to the same and no further patient follow-up is required at this time.  Courtney Davies 09/26/2023, 10:55 AM

## 2023-09-26 NOTE — Patient Instructions (Signed)
-  continue current medications

## 2023-09-26 NOTE — Progress Notes (Signed)
 [facility]  PATIENT: Courtney Davies DOB: 1975/07/02  REFERRING CLINICIAN: Tye Gall, * HISTORY FROM: patient  REASON FOR VISIT: new consult   HISTORICAL  CHIEF COMPLAINT:  Chief Complaint  Patient presents with   Cerebrovascular Accident    Rm 7 alone Pt is well, Reports she has some residual L side weakness but overall stable, no other concerns.     HISTORY OF PRESENT ILLNESS:   48 year old female here for evaluation of hospital follow-up from September 2024.  History of mild anoxic brain injury status post V-fib cardiac arrest in 2022 with ICD placement, hypertension, diabetes.  01/04/2023 patient presented to the hospital after having onset of acute dizziness and blurred vision at work.  Code stroke was activated.  Concern for posterior circulation stroke was raised and therefore patient received IV tenecteplase .  Symptoms improved over time.  MRI was negative for acute stroke.  Some functional overlay of physical exam findings were noted.  Stroke workup was completed.  Patient was discharged home.   REVIEW OF SYSTEMS: Full 14 system review of systems performed and negative with exception of: as per HPI.  ALLERGIES: Allergies  Allergen Reactions   Latex Hives   Other Other (See Comments), Anaphylaxis, Swelling and Hives    Hair glue causes throat to close and hives glue Hair glue causes throat to close    Codeine  Nausea And Vomiting    Was on an empty stomach.   Nitrofurantoin Rash    HOME MEDICATIONS: Outpatient Medications Prior to Visit  Medication Sig Dispense Refill   albuterol  (VENTOLIN  HFA) 108 (90 Base) MCG/ACT inhaler Inhale 2 puffs into the lungs every 6 (six) hours as needed for wheezing or shortness of breath. 18 g 2   alum & mag hydroxide-simeth (MAALOX MAX) 400-400-40 MG/5ML suspension Take 5 mLs by mouth every 6 (six) hours as needed for indigestion. 355 mL 0   atorvastatin  (LIPITOR) 40 MG tablet TAKE 1 AND 1/2  TABLETS(60 MG) BY MOUTH DAILY 135 tablet 1   BD PEN NEEDLE NANO 2ND GEN 32G X 4 MM MISC 3 (three) times daily. as directed     carvedilol  (COREG ) 6.25 MG tablet Take 1 tablet (6.25 mg total) by mouth 2 (two) times daily. 180 tablet 1   ELIQUIS  5 MG TABS tablet TAKE 1 TABLET(5 MG) BY MOUTH TWICE DAILY 60 tablet 5   gabapentin  (NEURONTIN ) 100 MG capsule Take 2 capsules (200 mg total) by mouth 2 (two) times daily. 120 capsule 3   hydrOXYzine  (ATARAX ) 10 MG tablet Take 1 tablet (10 mg total) by mouth at bedtime. 90 tablet 1   JARDIANCE  10 MG TABS tablet TAKE 1 TABLET(10 MG) BY MOUTH EVERY MORNING 90 tablet 1   losartan  (COZAAR ) 25 MG tablet TAKE 1 TABLET BY MOUTH DAILY 90 tablet 3   metformin (FORTAMET) 500 MG (OSM) 24 hr tablet Take 1,000 mg by mouth daily with breakfast.     OZEMPIC, 0.25 OR 0.5 MG/DOSE, 2 MG/3ML SOPN Inject 0.5 mg into the skin once a week.     polyethylene glycol (MIRALAX ) 17 g packet Take 17 g by mouth daily. 14 each 0   QUEtiapine  (SEROQUEL ) 100 MG tablet TAKE 1 TABLET(100 MG) BY MOUTH AT BEDTIME 30 tablet 6   rivastigmine  (EXELON ) 3 MG capsule TAKE 1 CAPSULE(3 MG) BY MOUTH TWICE DAILY 60 capsule 6   sertraline  (ZOLOFT ) 100 MG tablet TAKE 1 TABLET(100 MG) BY MOUTH DAILY 30 tablet 2   No facility-administered medications prior to  visit.    PAST MEDICAL HISTORY: Past Medical History:  Diagnosis Date   Anemia 09/2015   Anoxic brain injury (HCC)    Arthritis    knees (04/23/2017)   Asthma    teens; went away; came back (04/23/2017)   B12 deficiency anemia 04/23/2017   Cardiac arrest (HCC)    Chronic bronchitis (HCC)    Chronic low back pain with sciatica    Chronic lower back pain    Diabetes mellitus without complication (HCC)    Diabetes type 2, controlled (HCC)    Elevated ferritin level    Fatty liver    GERD (gastroesophageal reflux disease)    GERD with stricture    Headache    1-2/wk (04/23/2017)   History of blood transfusion plenty   related to  anemia (04/23/2017)   Hypertension    Inguinal hernia    Low back pain    Migraine    1-2/month (04/23/2017)   OA (osteoarthritis) of knee--left    Paroxysmal atrial fibrillation (HCC)    Sub-clinical -seen on device?, elevated CHADSVASC   Symptomatic anemia 04/23/2017   Vitamin B 12 deficiency     PAST SURGICAL HISTORY: Past Surgical History:  Procedure Laterality Date   ABDOMINAL HYSTERECTOMY  YRS AGO   COMPLETE   CESAREAN SECTION  1998; 2002   ESOPHAGEAL DILATION  02/2020   by Dr Corey Dial   INGUINAL HERNIA REPAIR Bilateral 1980s    total of 4 surgeries (04/23/2017)   IR GASTROSTOMY TUBE MOD SED  12/13/2020   KNEE ARTHROSCOPY WITH MEDIAL MENISECTOMY Left 01/30/2018   Procedure: LEFT KNEE ARTHROSCOPY WITH PARTIAL LATERAL MENISCECTOMY;  Surgeon: Arnie Lao, MD;  Location: [facility];  Service: Orthopedics;  Laterality: Left;   KNEE CARTILAGE SURGERY Left    SUBQ ICD IMPLANT N/A 09/13/2021   Procedure: SUBQ ICD IMPLANT;  Surgeon: Lei Pump, MD;  Location[facility] INVASIVE CV LAB;  Service: Cardiovascular;  Laterality: N/A;   TUBAL LIGATION  2002    FAMILY HISTORY: Family History  Problem Relation Age of Onset   Stroke Mother    Diabetes Mother    Diabetes Father    Stroke Maternal Grandmother    Diabetes Maternal Grandmother    Anemia Paternal Grandmother    Valvular heart disease Paternal Grandmother    Hypertension Mother    Prostate cancer Maternal Uncle        ? intestinal also    SOCIAL HISTORY: Social History   Socioeconomic History   Marital status: Single    Spouse name: Not on file   Number of children: 2   Years of education: 11th   Highest education level: Not on file  Occupational History   Occupation: salad maker in restaurant  Tobacco Use   Smoking status: Former    Types: Cigarettes   Smokeless tobacco: Never  Vaping Use   Vaping status: Never Used  Substance and Sexual Activity   Alcohol use: Yes    Comment: occ   Drug use:  Yes    Types: Marijuana    Comment: daily   Sexual activity: Not Currently    Birth control/protection: Surgical  Other Topics Concern   Not on file  Social History Narrative   ** Merged History Encounter **       Lives at home with her two children. Occasional caffeine use. Right-handed.   Social Drivers of Health   Financial Resource Strain: High Risk (08/02/2023)   Overall Financial Resource Strain (CARDIA)  Difficulty of Paying Living Expenses: Very hard  Food Insecurity: Food Insecurity Present (08/01/2023)   Hunger Vital Sign    Worried About Running Out of Food in the Last Year: Sometimes true    Ran Out of Food in the Last Year: Sometimes true  Transportation Needs: No Transportation Needs (06/25/2023)   PRAPARE - Administrator, Civil Service (Medical): No    Lack of Transportation (Non-Medical): No  Recent Concern: Transportation Needs - Unmet Transportation Needs (05/15/2023)   PRAPARE - Transportation    Lack of Transportation (Medical): Yes    Lack of Transportation (Non-Medical): Yes  Physical Activity: Not on file  Stress: Stress Concern Present (05/15/2023)   [employer] of Occupational Health - Occupational Stress Questionnaire    Feeling of Stress : To some extent  Social Connections: Unknown (07/04/2023)   Social Connection and Isolation Panel    Frequency of Communication with Friends and Family: Never    Frequency of Social Gatherings with Friends and Family: Never    Attends Religious Services: 1 to 4 times per year    Active Member of [employer] or Organizations: No    Attends Banker Meetings: Never    Marital Status: Patient declined  Catering manager Violence: Not At Risk (01/08/2023)   Humiliation, Afraid, Rape, and Kick questionnaire    Fear of Current or Ex-Partner: No    Emotionally Abused: No    Physically Abused: No    Sexually Abused: No     PHYSICAL EXAM  GENERAL EXAM/CONSTITUTIONAL: Vitals:  Vitals:    09/26/23 1415  BP: 128/78  Pulse: 71  Weight: 198 lb (89.8 kg)  Height: 5' 5 (1.651 m)   Body mass index is 32.95 kg/m. Wt Readings from Last 3 Encounters:  09/26/23 198 lb (89.8 kg)  09/24/23 205 lb (93 kg)  08/22/23 200 lb 12.8 oz (91.1 kg)   Patient is in no distress; well developed, nourished and groomed; neck is supple  CARDIOVASCULAR: Examination of carotid arteries is normal; no carotid bruits Regular rate and rhythm, no murmurs Examination of peripheral vascular system by observation and palpation is normal  EYES: Ophthalmoscopic exam of optic discs and posterior segments is normal; no papilledema or hemorrhages No results found.  MUSCULOSKELETAL: Gait, strength, tone, movements noted in Neurologic exam below  NEUROLOGIC: MENTAL STATUS:     03/01/2022   10:05 AM  MMSE - Mini Mental State Exam  Orientation to time 0  Orientation to Place 1  Registration 3  Attention/ Calculation 0  Recall 0  Language- name 2 objects 2  Language- repeat 0  Language- follow 3 step command 3  Language- read & follow direction 1  Write a sentence 0  Copy design 0  Total score 10   awake, alert, oriented to person, place and time recent and remote memory intact normal attention and concentration language fluent, comprehension intact, naming intact fund of knowledge appropriate  CRANIAL NERVE:  2nd - no papilledema on fundoscopic exam 2nd, 3rd, 4th, 6th - pupils equal and reactive to light, visual fields full to confrontation, extraocular muscles intact, no nystagmus 5th - facial sensation symmetric 7th - facial strength symmetric 8th - hearing intact 9th - palate elevates symmetrically, uvula midline 11th - shoulder shrug symmetric 12th - tongue protrusion midline  MOTOR:  normal bulk and tone, full strength in the BUE, BLE; EXCEPT LIMITED IN LEFT LEG HF, KE, KF DUE TO KNEE PAIN  SENSORY:  normal and symmetric to light touch,  temperature, vibration; EXCEPT DECR IN  LEFT LEG  COORDINATION:  finger-nose-finger, fine finger movements normal  REFLEXES:  deep tendon reflexes TRACE and symmetric  GAIT/STATION:  narrow based gait     DIAGNOSTIC DATA (LABS, IMAGING, TESTING) - I reviewed patient records, labs, notes, testing and imaging myself where available.  Lab Results  Component Value Date   WBC 7.1 07/12/2023   HGB 12.9 07/12/2023   HCT 43.6 07/12/2023   MCV 74.3 (L) 07/12/2023   PLT 281 07/12/2023      Component Value Date/Time   NA 138 06/13/2023 1552   NA 138 03/31/2021 1303   K 4.1 06/13/2023 1552   CL 106 06/13/2023 1552   CO2 26 06/13/2023 1552   GLUCOSE 149 (H) 06/13/2023 1552   BUN 12 06/13/2023 1552   BUN 12 03/31/2021 1303   CREATININE 0.74 06/13/2023 1552   CALCIUM  9.4 06/13/2023 1552   PROT 7.1 06/13/2023 1552   PROT 6.8 12/19/2017 0907   ALBUMIN 3.6 01/08/2023 1117   ALBUMIN 4.0 12/19/2017 0907   AST 14 06/13/2023 1552   ALT 18 06/13/2023 1552   ALKPHOS 97 01/08/2023 1117   BILITOT 0.3 06/13/2023 1552   BILITOT 0.2 12/19/2017 0907   GFRNONAA >60 01/10/2023 1115   GFRAA >60 08/07/2019 1542   Lab Results  Component Value Date   CHOL 157 04/25/2023   HDL 49 (L) 04/25/2023   LDLCALC 92 04/25/2023   TRIG 72 04/25/2023   CHOLHDL 3.2 04/25/2023   Lab Results  Component Value Date   HGBA1C 7.1 (H) 01/09/2023   Lab Results  Component Value Date   VITAMINB12 219 (L) 12/19/2017   Component Ref Range & Units 7 mo ago (02/28/23) Comments  Vitamin B-12 180 - 914 pg/mL 342    Lab Results  Component Value Date   TSH 0.44 12/06/2022   12/28/17 MRI of the lumbar spine without contrast shows the following: 1.     At L5-S1, there is a small left paramedian disc herniation and facet hypertrophy.  There is moderately severe left lateral recess stenosis with potential for left S1 nerve root compression.  There is also mild to moderate foraminal narrowing, worse on the right that does not appear to lead to nerve root  compression. 2.     At L4-L5, there is a left paramedian disc protrusion causing mild to moderate left lateral recess stenosis but it does not appear to lead to any nerve root compression.   3.     There are also milder degenerative changes at L1-L2 and L3-L4 not leading to significant foraminal narrowing, lateral recess stenosis or nerve root compression.  01/10/2023 MRI brain without contrast - No evidence of acute intracranial abnormality.    ASSESSMENT AND PLAN  48 y.o. year old female here with history of anoxic brain injury after episode of V-fib in July 2022 status post ICD placement and atrial fibrillation, hypertension and diabetes was admitted 01/08/23 with acute onset dizziness and blurred vision.   Dx:  1. TIA (transient ischemic attack)     PLAN:   Stroke-like symptoms (with vertigo and blurry vision) s/p TNK (Sept 2024), etiology: TIA due to afib vs complicated migraine   CT head 9/24: No acute intracranial hemorrhage or evidence of acute large vessel territory infarct.  Aspects score is 10. CTA head & neck 9/24: Negative CTA.  No occlusion, stenosis, or irregularity of major arteries in the head and neck. Repeat CT head 24 hours post TNK 9/25: No evidence  of acute intracranial abnormality.  Minor paranasal sinus disease. MRI unremarkable  2D Echo LVEF 60 to 65%   PAF  Transient AF during admission following V-fib arrest in September 2022 PAF was found on ICD interrogation Followed by [facility] health heart care Dr Parke Boll [id number]-VASc of 5 now continue eliquis  5mg  BID   Hypertension Long term BP goal: Normotension Continue losartan , coreg    Hyperlipidemia LDL 104, goal < 70 Continue rosuvastatin   Diabetes type II Uncontrolled Hyperglycemia HgbA1c 7.1, goal < 7.0 Continue metformin, Ozempic, jardiance    Return for return to PCP, pending if symptoms worsen or fail to improve.    Omega Bible, MD 09/26/2023, 3:10 PM Certified in Neurology,  Neurophysiology and Neuroimaging  [facility] [address], [address] [place], [place] [address] [phone]

## 2023-09-26 NOTE — Progress Notes (Signed)
 [employer]/ [facility] has fax requests regarding statin coverage. Atorvastatin  is not covered. Rosuvastatin is covered under insurance. Discontinue atorvastatin . Start rosuvastatin 30 mg po daily.

## 2023-09-27 ENCOUNTER — Other Ambulatory Visit: Payer: Self-pay

## 2023-09-27 ENCOUNTER — Telehealth: Payer: Self-pay

## 2023-09-27 ENCOUNTER — Encounter (HOSPITAL_BASED_OUTPATIENT_CLINIC_OR_DEPARTMENT_OTHER): Payer: Self-pay | Admitting: Emergency Medicine

## 2023-09-27 ENCOUNTER — Emergency Department (HOSPITAL_BASED_OUTPATIENT_CLINIC_OR_DEPARTMENT_OTHER)
Admission: EM | Admit: 2023-09-27 | Discharge: 2023-09-27 | Disposition: A | Discharge status: Home / Self Care | Attending: Emergency Medicine | Admitting: Emergency Medicine

## 2023-09-27 DIAGNOSIS — Z91040 Latex allergy status: Secondary | ICD-10-CM | POA: Insufficient documentation

## 2023-09-27 DIAGNOSIS — Z8673 Personal history of transient ischemic attack (TIA), and cerebral infarction without residual deficits: Secondary | ICD-10-CM | POA: Insufficient documentation

## 2023-09-27 DIAGNOSIS — K91840 Postprocedural hemorrhage and hematoma of a digestive system organ or structure following a digestive system procedure: Secondary | ICD-10-CM | POA: Diagnosis not present

## 2023-09-27 DIAGNOSIS — R7401 Elevation of levels of liver transaminase levels: Secondary | ICD-10-CM | POA: Diagnosis not present

## 2023-09-27 DIAGNOSIS — Z7901 Long term (current) use of anticoagulants: Secondary | ICD-10-CM | POA: Insufficient documentation

## 2023-09-27 DIAGNOSIS — K1379 Other lesions of oral mucosa: Secondary | ICD-10-CM

## 2023-09-27 LAB — CBC WITH DIFFERENTIAL/PLATELET
Abs Immature Granulocytes: 0.03 10*3/uL (ref 0.00–0.07)
Basophils Absolute: 0 10*3/uL (ref 0.0–0.1)
Basophils Relative: 0 %
Eosinophils Absolute: 0.4 10*3/uL (ref 0.0–0.5)
Eosinophils Relative: 5 %
HCT: 44.1 % (ref 36.0–46.0)
Hemoglobin: 13.8 g/dL (ref 12.0–15.0)
Immature Granulocytes: 0 %
Lymphocytes Relative: 37 %
Lymphs Abs: 3 10*3/uL (ref 0.7–4.0)
MCH: 22.1 pg — ABNORMAL LOW (ref 26.0–34.0)
MCHC: 31.3 g/dL (ref 30.0–36.0)
MCV: 70.7 fL — ABNORMAL LOW (ref 80.0–100.0)
Monocytes Absolute: 0.7 10*3/uL (ref 0.1–1.0)
Monocytes Relative: 8 %
Neutro Abs: 4 10*3/uL (ref 1.7–7.7)
Neutrophils Relative %: 50 %
Platelets: 230 10*3/uL (ref 150–400)
RBC: 6.24 MIL/uL — ABNORMAL HIGH (ref 3.87–5.11)
RDW: 17.3 % — ABNORMAL HIGH (ref 11.5–15.5)
WBC: 8.1 10*3/uL (ref 4.0–10.5)
nRBC: 0 % (ref 0.0–0.2)

## 2023-09-27 LAB — COMPREHENSIVE METABOLIC PANEL WITH GFR
ALT: 204 U/L — ABNORMAL HIGH (ref 0–44)
AST: 123 U/L — ABNORMAL HIGH (ref 15–41)
Albumin: 4.5 g/dL (ref 3.5–5.0)
Alkaline Phosphatase: 319 U/L — ABNORMAL HIGH (ref 38–126)
Anion gap: 15 (ref 5–15)
BUN: 14 mg/dL (ref 6–20)
CO2: 21 mmol/L — ABNORMAL LOW (ref 22–32)
Calcium: 9.4 mg/dL (ref 8.9–10.3)
Chloride: 104 mmol/L (ref 98–111)
Creatinine, Ser: 0.77 mg/dL (ref 0.44–1.00)
GFR, Estimated: >60 mL/min (ref >60)
Glucose, Bld: 74 mg/dL (ref 70–99)
Potassium: 3.7 mmol/L (ref 3.5–5.1)
Sodium: 139 mmol/L (ref 135–145)
Total Bilirubin: 0.5 mg/dL (ref 0.0–1.2)
Total Protein: 8.1 g/dL (ref 6.5–8.1)

## 2023-09-27 MED ORDER — TRANEXAMIC ACID 1000 MG/10ML IV SOLN
500.0000 mg | Freq: Once | INTRAVENOUS | Status: AC
  Administered 2023-09-27: 500 mg via TOPICAL
  Filled 2023-09-27: qty 10

## 2023-09-27 MED ORDER — OXYCODONE HCL 5 MG PO TABS: 5.0000 mg | ORAL_TABLET | Freq: Four times a day (QID) | ORAL | 0 refills | Status: DC | PRN

## 2023-09-27 NOTE — ED Notes (Signed)
 Pt utilizing suction for saliva but no current bleeding, just pink tinged drool.

## 2023-09-27 NOTE — Telephone Encounter (Signed)
 Contacted pts pharmacy regarding needing information for medication coverage for PA.

## 2023-09-27 NOTE — ED Provider Notes (Signed)
 [place] [facility] Provider Note   CSN: [id number] Arrival date & time: 09/27/23  1928     Patient presents with: Dental Problem   Courtney Davies is a 48 y.o. female.  Patient presents the emergency department today with concerns of dental bleeding.  She is currently on Eliquis  and reports that she had 2 teeth pulled from her right upper jaw earlier today by her dentist.  She states that she takes Eliquis  for recent CVA.  She was not advised to discontinue her Eliquis  prior to any dental procedure.  She states that she has been having some bleeding ongoing for the last 7 hours.  Denies any feelings of weakness, dizziness, or lightheadedness.  HPI     Prior to Admission medications   Medication Sig Start Date End Date Taking? Authorizing Provider  oxyCODONE  (ROXICODONE ) 5 MG immediate release tablet Take 1 tablet (5 mg total) by mouth every 6 (six) hours as needed for severe pain (pain score 7-10). 09/27/23  Yes Evamarie Raetz A, PA-C  albuterol  (VENTOLIN  HFA) 108 (90 Base) MCG/ACT inhaler Inhale 2 puffs into the lungs every 6 (six) hours as needed for wheezing or shortness of breath. 01/24/21   Love, Renay Carota, PA-C  alum & mag hydroxide-simeth (MAALOX MAX) 400-400-40 MG/5ML suspension Take 5 mLs by mouth every 6 (six) hours as needed for indigestion. 09/24/23   Sallyanne Creamer, DO  BD PEN NEEDLE NANO 2ND GEN 32G X 4 MM MISC 3 (three) times daily. as directed 07/11/21   [provider]  carvedilol  (COREG ) 6.25 MG tablet Take 1 tablet (6.25 mg total) by mouth 2 (two) times daily. 07/11/23   [name], [name], NP  ELIQUIS  5 MG TABS tablet TAKE 1 TABLET(5 MG) BY MOUTH TWICE DAILY 09/23/23   [name], [name], NP  gabapentin  (NEURONTIN ) 100 MG capsule Take 2 capsules (200 mg total) by mouth 2 (two) times daily. 06/13/23   [name], [name], NP  hydrOXYzine  (ATARAX ) 10 MG tablet Take 1 tablet (10 mg total) by mouth at bedtime. 07/04/23   [name], [name], NP  JARDIANCE  10 MG  TABS tablet TAKE 1 TABLET(10 MG) BY MOUTH EVERY MORNING 03/08/23   [name], [name], NP  losartan  (COZAAR ) 25 MG tablet TAKE 1 TABLET BY MOUTH DAILY 01/29/23   [name], [name], NP  metformin (FORTAMET) 500 MG (OSM) 24 hr tablet Take 1,000 mg by mouth daily with breakfast.    [provider]  OZEMPIC, 0.25 OR 0.5 MG/DOSE, 2 MG/3ML SOPN Inject 0.5 mg into the skin once a week. 01/06/23   [provider]  polyethylene glycol (MIRALAX ) 17 g packet Take 17 g by mouth daily. 09/24/23   Sallyanne Creamer, DO  QUEtiapine  (SEROQUEL ) 100 MG tablet TAKE 1 TABLET(100 MG) BY MOUTH AT BEDTIME 09/04/23   Rawland Caddy, MD  rivastigmine  (EXELON ) 3 MG capsule TAKE 1 CAPSULE(3 MG) BY MOUTH TWICE DAILY 08/09/23   Rawland Caddy, MD  rosuvastatin  (CRESTOR ) 20 MG tablet Take 1.5 tablets (30 mg total) by mouth daily. 09/26/23   [name], [name], NP  sertraline  (ZOLOFT ) 100 MG tablet TAKE 1 TABLET(100 MG) BY MOUTH DAILY 08/20/23   Bahraini, Sarah A    Allergies: Latex, Other, Codeine , and Nitrofurantoin    Review of Systems  HENT:         Dental bleeding  All other systems reviewed and are negative.   Updated Vital Signs BP (!) 151/106  Pulse 76   Temp 98.2 F (36.8 C)   Resp 12   Ht 5' 5 (1.651 m)   Wt 95.3 kg   LMP 12/03/2012   SpO2 98%   BMI 34.95 kg/m   Physical Exam Vitals and nursing note reviewed.  Constitutional:      General: She is not in acute distress.    Appearance: She is well-developed.  HENT:     Head: Normocephalic and atraumatic.     Comments: Dental bleeding present to the right upper teeth  Eyes:     Conjunctiva/sclera: Conjunctivae normal.    Cardiovascular:     Rate and Rhythm: Normal rate and regular rhythm.     Heart sounds: No murmur heard. Pulmonary:     Effort: Pulmonary effort is normal. No respiratory distress.     Breath sounds: Normal breath sounds.  Abdominal:     Palpations: Abdomen is soft.     Tenderness: There is no abdominal tenderness.    Musculoskeletal:        General: No swelling.     Cervical back: Neck supple.   Skin:    General: Skin is warm and dry.     Capillary Refill: Capillary refill takes less than 2 seconds.   Neurological:     Mental Status: She is alert.   Psychiatric:        Mood and Affect: Mood normal.     (all labs ordered are listed, but only abnormal results are displayed) Labs Reviewed  CBC WITH DIFFERENTIAL/PLATELET - Abnormal; Notable for the following components:      Result Value   RBC 6.24 (*)    MCV 70.7 (*)    MCH 22.1 (*)    RDW 17.3 (*)    All other components within normal limits  COMPREHENSIVE METABOLIC PANEL WITH GFR - Abnormal; Notable for the following components:   CO2 21 (*)    AST 123 (*)    ALT 204 (*)    Alkaline Phosphatase 319 (*)    All other components within normal limits    EKG: None  Radiology: No results found.  Procedures   Medications Ordered in the ED  tranexamic acid (CYKLOKAPRON) injection 500 mg (500 mg Topical Given by Other 09/27/23 2019)                                  Medical Decision Making Amount and/or Complexity of Data Reviewed Labs: ordered.  Risk Prescription drug management.   This patient presents to the ED for concern of dental bleeding. Differential diagnosis includes dental bleeding due to blood thinner use, symptomatic anemia, dental injury, dental abscess    Lab Tests:  I Ordered, and personally interpreted labs.  The pertinent results include: CBC with no signs of significant anemia, CMP with signs of transaminitis with elevations in the AST and ALT as well as alkaline phosphatase.  Likely secondary to statin use.   Medicines ordered and prescription drug management:  I ordered medication including TXA for hemorrhage Reevaluation of the patient after these medicines showed that the patient improved I have reviewed the patients home medicines and have made adjustments as needed   Problem List / ED  Course:  Patient presents the emergency department today with concerns abdominal pain.  She reports that she had a tooth removed earlier today by her dentist but was reportedly not told to discontinue her blood thinner.  She takes  Eliquis  for recent CVA.  She is here concerned that she is having continued bleeding with minimal control of the bleeding.  Reports that the bleeding comes in waves and some episodes that are heavier and sometimes more diminished. On exam, there is minimal bleeding present to the right upper jaw but there is some bleeding present.  Will place TXA soaked gauze in this area to help promote clotting.  Also added on basic labs for assessment of patient's hemoglobin status. On reevaluation after TXA gauze has been in place for about 30 minutes, bleeding appears to be slowing down.  Will replace gauze as we await for lab results. Lab results show a stable hemoglobin.  Some transaminitis is seen likely secondary to patient's statin use.  Patient with no focal abdominal pain and no jaundice seen in the skin at this time. On repeat evaluation, patient no longer having any significant bleeding.  There is no active hemorrhage seen.  Patient hemodynamically stable and at this time is stable for outpatient follow-up and can discharge home with planned outpatient management by PCP and dentist.  Patient agreeable with this current plan verbalized understanding return precautions.  Discharged home in stable condition.   Final diagnoses:  Oral bleeding  Transaminitis    ED Discharge Orders          Ordered    oxyCODONE  (ROXICODONE ) 5 MG immediate release tablet  Every 6 hours PRN        09/27/23 2226               Yulianna Folse A, PA-C 09/27/23 2227    Sallyanne Creamer, DO 09/30/23 1137

## 2023-09-27 NOTE — ED Triage Notes (Signed)
 Pt POV reports having 2 R upper teeth pulled earlier today, now having ongoing bleeding. Takes blood thinners, last taken today.

## 2023-09-27 NOTE — Discharge Instructions (Signed)
 You were seen in the emergency department today for concerns of bleeding following a dental procedure.  You had TXA soaked gauze placed and allowed to soak into the wound which help promote a clot and stop the bleeding that you are experiencing.  Your labs are thankfully reassuring.  I would recommend close follow-up with your primary care provider.  Return to the emergency department for any concerns of new or worsening symptoms.

## 2023-09-27 NOTE — Telephone Encounter (Signed)
 Patient medication was approved. ( Rosuvastatin  Calcium ) approval is under media tab

## 2023-10-01 ENCOUNTER — Telehealth: Payer: Self-pay

## 2023-10-01 NOTE — Telephone Encounter (Signed)
 A prior authorization request was received for atorvastatin  40 mg, however it appears patient is not taking this medication and is taking crestor  (rosuvastatin ) instead

## 2023-10-02 ENCOUNTER — Encounter: Payer: Medicare Other | Attending: Physical Medicine & Rehabilitation | Admitting: Physical Medicine & Rehabilitation

## 2023-10-02 ENCOUNTER — Encounter: Payer: Self-pay | Admitting: Physical Medicine & Rehabilitation

## 2023-10-02 VITALS — BP 118/75 | HR 71 | Ht 65.0 in | Wt 194.8 lb

## 2023-10-02 DIAGNOSIS — G931 Anoxic brain damage, not elsewhere classified: Secondary | ICD-10-CM | POA: Diagnosis not present

## 2023-10-02 DIAGNOSIS — R4189 Other symptoms and signs involving cognitive functions and awareness: Secondary | ICD-10-CM | POA: Insufficient documentation

## 2023-10-02 DIAGNOSIS — G479 Sleep disorder, unspecified: Secondary | ICD-10-CM | POA: Insufficient documentation

## 2023-10-02 DIAGNOSIS — R29818 Other symptoms and signs involving the nervous system: Secondary | ICD-10-CM | POA: Insufficient documentation

## 2023-10-02 NOTE — Progress Notes (Signed)
 Subjective:    Patient ID: Courtney Davies, female    DOB: 09-08-1975, 48 y.o.   MRN: [id number]  HPI  Courtney Davies is here in follow up of her ABI. 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 She likes her work and has been there almost a year!  Her sleep had been improved until a few weeks ago. She has had more racing thoughts and can't settle down. She does point to some personal issues which may be weighing on her.  She uses seroquel  every night. She uses melatonin as needed on the weekends as it can cause her to be drowsy in the am. She has 5 and 10mg  doses at home. She did not disclose the personal situation which was affecting her  SHe continues to struggle with her left knee. She is awaiting knee replacement which might happen this Fall. She has tried a knee brace but says that made her pain worse.   Pain Inventory Average Pain 9 Pain Right Now 9 My pain is constant, tingling, and aching  In the last 24 hours, has pain interfered with the following? General activity 7 Relation with others 7 Enjoyment of life 7 What TIME of day is your pain at its worst? morning  and daytime Sleep (in general) Fair  Pain is worse with: sitting Pain improves with: rest Relief from Meds: 0  Family History  Problem Relation Age of Onset   Stroke Mother    Diabetes Mother    Diabetes Father    Stroke Maternal Grandmother    Diabetes Maternal Grandmother    Anemia Paternal Grandmother    Valvular heart disease Paternal Grandmother    Hypertension Mother    Prostate cancer Maternal Uncle        ? intestinal also   Social History   Socioeconomic History   Marital status: Single    Spouse name: Not on file   Number of children: 2   Years of education: 11th   Highest education level: Not on file  Occupational History   Occupation: salad maker in restaurant  Tobacco Use   Smoking status: Former    Types: Cigarettes   Smokeless tobacco: Never  Vaping Use   Vaping status:  Never Used  Substance and Sexual Activity   Alcohol use: Yes    Comment: occ   Drug use: Yes    Types: Marijuana    Comment: daily   Sexual activity: Not Currently    Birth control/protection: Surgical  Other Topics Concern   Not on file  Social History Narrative   ** Merged History Encounter **       Lives at home with her two children. Occasional caffeine use. Right-handed.   Social Drivers of Health   Financial Resource Strain: High Risk (08/02/2023)   Overall Financial Resource Strain (CARDIA)    Difficulty of Paying Living Expenses: Very hard  Food Insecurity: Food Insecurity Present (08/01/2023)   Hunger Vital Sign    Worried About Running Out of Food in the Last Year: Sometimes true    Ran Out of Food in the Last Year: Sometimes true  Transportation Needs: No Transportation Needs (06/25/2023)   PRAPARE - Administrator, Civil Service (Medical): No    Lack of Transportation (Non-Medical): No  Recent Concern: Transportation Needs - Unmet Transportation Needs (05/15/2023)   PRAPARE - Administrator, Civil Service (Medical): Yes    Lack of Transportation (Non-Medical): Yes  Physical Activity: Not  on file  Stress: Stress Concern Present (05/15/2023)   [employer] of Occupational Health - Occupational Stress Questionnaire    Feeling of Stress : To some extent  Social Connections: Unknown (07/04/2023)   Social Connection and Isolation Panel    Frequency of Communication with Friends and Family: Never    Frequency of Social Gatherings with Friends and Family: Never    Attends Religious Services: 1 to 4 times per year    Active Member of [employer] or Organizations: No    Attends Engineer, structural: Never    Marital Status: Patient declined   Past Surgical History:  Procedure Laterality Date   ABDOMINAL HYSTERECTOMY  YRS AGO   COMPLETE   CESAREAN SECTION  1998; 2002   ESOPHAGEAL DILATION  02/2020   by Dr Corey Dial   INGUINAL HERNIA REPAIR  Bilateral 1980s    total of 4 surgeries (04/23/2017)   IR GASTROSTOMY TUBE MOD SED  12/13/2020   KNEE ARTHROSCOPY WITH MEDIAL MENISECTOMY Left 01/30/2018   Procedure: LEFT KNEE ARTHROSCOPY WITH PARTIAL LATERAL MENISCECTOMY;  Surgeon: Arnie Lao, MD;  Location: [facility];  Service: Orthopedics;  Laterality: Left;   KNEE CARTILAGE SURGERY Left    SUBQ ICD IMPLANT N/A 09/13/2021   Procedure: SUBQ ICD IMPLANT;  Surgeon: Lei Pump, MD;  Location: [facility] INVASIVE CV LAB;  Service: Cardiovascular;  Laterality: N/A;   TUBAL LIGATION  2002   Past Surgical History:  Procedure Laterality Date   ABDOMINAL HYSTERECTOMY  YRS AGO   COMPLETE   CESAREAN SECTION  1998; 2002   ESOPHAGEAL DILATION  02/2020   by Dr Linna Richard HERNIA REPAIR Bilateral 1980s    total of 4 surgeries (04/23/2017)   IR GASTROSTOMY TUBE MOD SED  12/13/2020   KNEE ARTHROSCOPY WITH MEDIAL MENISECTOMY Left 01/30/2018   Procedure: LEFT KNEE ARTHROSCOPY WITH PARTIAL LATERAL MENISCECTOMY;  Surgeon: Arnie Lao, MD;  Location: [facility];  Service: Orthopedics;  Laterality: Left;   KNEE CARTILAGE SURGERY Left    SUBQ ICD IMPLANT N/A 09/13/2021   Procedure: SUBQ ICD IMPLANT;  Surgeon: Lei Pump, MD;  Location: [facility] INVASIVE CV LAB;  Service: Cardiovascular;  Laterality: N/A;   TUBAL LIGATION  2002   Past Medical History:  Diagnosis Date   Anemia 09/2015   Anoxic brain injury (HCC)    Arthritis    knees (04/23/2017)   Asthma    teens; went away; came back (04/23/2017)   B12 deficiency anemia 04/23/2017   Cardiac arrest (HCC)    Chronic bronchitis (HCC)    Chronic low back pain with sciatica    Chronic lower back pain    Diabetes mellitus without complication (HCC)    Diabetes type 2, controlled (HCC)    Elevated ferritin level    Fatty liver    GERD (gastroesophageal reflux disease)    GERD with stricture    Headache    1-2/wk (04/23/2017)   History of blood transfusion plenty    related to anemia (04/23/2017)   Hypertension    Inguinal hernia    Low back pain    Migraine    1-2/month (04/23/2017)   OA (osteoarthritis) of knee--left    Paroxysmal atrial fibrillation (HCC)    Sub-clinical -seen on device?, elevated CHADSVASC   Symptomatic anemia 04/23/2017   Vitamin B 12 deficiency    BP 118/75   Pulse 71   Ht 5' 5 (1.651 m)   Wt 194 lb 12.8 oz (88.4 kg)  LMP 12/03/2012   SpO2 97%   BMI 32.42 kg/m   Opioid Risk Score:   Fall Risk Score:  `1  Depression screen [facility] 2/9     08/01/2023    3:50 PM 07/04/2023    3:24 PM 06/13/2023    3:08 PM 05/23/2023    3:19 PM 05/06/2023    1:51 PM 03/29/2023    3:55 PM 03/21/2023   12:42 PM  Depression screen PHQ 2/9  Decreased Interest 1 1 1   0 1 0  Down, Depressed, Hopeless 1 1 1   0 1 0  PHQ - 2 Score 2 2 2   0 2 0  Altered sleeping 1 0 0   2   Tired, decreased energy 0 1 1   2    Change in appetite 0 0 0   0   Feeling bad or failure about yourself  0 0 0   2   Trouble concentrating 3 3 0   1   Moving slowly or fidgety/restless 0 3 0   0   Suicidal thoughts 0 0 0   0   PHQ-9 Score 6 9 3   9    Difficult doing work/chores  Not difficult at all Not difficult at all   Somewhat difficult      Information is confidential and restricted. Go to Review Flowsheets to unlock data.     Review of Systems  Musculoskeletal:  Positive for gait problem.       Left knee pain  Hematological:  Bruises/bleeds easily.       Eliquis   All other systems reviewed and are negative.      Objective:   Physical Exam  General: No acute distress HEENT: NCAT, EOMI, oral membranes moist Cards: reg rate  Chest: normal effort Abdomen: Soft, NT, ND Skin: dry, intact Extremities: no edema Psych: pleasant and appropriate, can be a little labile, but generally better Skin: intact Neuro: Alert and oriented x 3. Improved attention,  insight and awareness. Intact Memory. Normal language and speech. Cranial nerve exam unremarkable. MMT:    Motor 5/5 in all 4's. Sensory normal.  Musculoskeletal: antalgic LLE--KNEE unchanged   Assessment & Plan:  1.  Decline in ADL, mobility , and cognition secondary to anoxic brain injury -continue work at [employer]-- she seems to have found a good spot -doing ok with driving -ok to continue exelon  3mg  bid--SHE CAN STOP as far as I'm concerned 2. Left knee pain: TKA this Fall potentially             voltaren  gel prn.  4. Mood/sleep:               - seroquel   100mg  at night for sleep/mood stability  -melatonin prn for now, perhaps at the lower 5mg  dose. Can use every night if she tolerates              --continue zoloft  for mood.   -church involvement encouraged 5. T2DM per primary?: 6. HTN: 11. SVT/NSVT: On coreg  and amiodarone  bid for rate control.               -s/p ICD/pacer              -continue per cardiology.    20  minutes of face to face patient care time were spent during this visit. All questions were encouraged and answered.  Follow up with me in 3 mos. I encouraged her to call me if any issues prior to her follow-up visit.

## 2023-10-02 NOTE — Patient Instructions (Addendum)
 ALWAYS FEEL FREE TO CALL OUR OFFICE WITH ANY PROBLEMS OR QUESTIONS [phone])  **PLEASE NOTE** ALL MEDICATION REFILL REQUESTS (INCLUDING CONTROLLED SUBSTANCES) NEED TO BE MADE AT LEAST 7 DAYS PRIOR TO REFILL BEING DUE. ANY REFILL REQUESTS INSIDE THAT TIME FRAME MAY RESULT IN DELAYS IN RECEIVING YOUR PRESCRIPTION.    AT BEDTIME DURING THE WEEK CONSIDER TAKING 5MG  OF MELATONIN WITH YOUR SEROQUEL  UNTIL YOU ARE ABLE TO WORK THROUGH YOUR PERSONAL ISSUE.   AS FAR AS I'M CONCERNED, YOU CAN STOP THE EXELON 

## 2023-10-03 DIAGNOSIS — M1712 Unilateral primary osteoarthritis, left knee: Secondary | ICD-10-CM

## 2023-10-08 DIAGNOSIS — I252 Old myocardial infarction: Secondary | ICD-10-CM | POA: Diagnosis not present

## 2023-10-08 DIAGNOSIS — Z7984 Long term (current) use of oral hypoglycemic drugs: Secondary | ICD-10-CM | POA: Diagnosis not present

## 2023-10-08 DIAGNOSIS — E1140 Type 2 diabetes mellitus with diabetic neuropathy, unspecified: Secondary | ICD-10-CM | POA: Diagnosis not present

## 2023-10-08 DIAGNOSIS — Z95810 Presence of automatic (implantable) cardiac defibrillator: Secondary | ICD-10-CM | POA: Diagnosis not present

## 2023-10-08 DIAGNOSIS — Z6831 Body mass index (BMI) 31.0-31.9, adult: Secondary | ICD-10-CM | POA: Diagnosis not present

## 2023-10-08 DIAGNOSIS — G309 Alzheimer's disease, unspecified: Secondary | ICD-10-CM | POA: Diagnosis not present

## 2023-10-08 DIAGNOSIS — E669 Obesity, unspecified: Secondary | ICD-10-CM | POA: Diagnosis not present

## 2023-10-08 DIAGNOSIS — Z7901 Long term (current) use of anticoagulants: Secondary | ICD-10-CM | POA: Diagnosis not present

## 2023-10-08 DIAGNOSIS — F324 Major depressive disorder, single episode, in partial remission: Secondary | ICD-10-CM | POA: Diagnosis not present

## 2023-10-08 DIAGNOSIS — I69334 Monoplegia of upper limb following cerebral infarction affecting left non-dominant side: Secondary | ICD-10-CM | POA: Diagnosis not present

## 2023-10-08 DIAGNOSIS — Z87891 Personal history of nicotine dependence: Secondary | ICD-10-CM | POA: Diagnosis not present

## 2023-10-08 DIAGNOSIS — E785 Hyperlipidemia, unspecified: Secondary | ICD-10-CM | POA: Diagnosis not present

## 2023-10-09 ENCOUNTER — Ambulatory Visit: Payer: Self-pay | Admitting: *Deleted

## 2023-10-09 NOTE — Telephone Encounter (Signed)
 FYI Only or Action Required?: FYI only for provider.  Patient was last seen in primary care on 08/22/2023 by [name] BRAVO, NP. Called Nurse Triage reporting Breast Mass. Symptoms began several weeks ago. Interventions attempted: Nothing. Symptoms are: gradually worsening.  Triage Disposition: See PCP When Office is Open (Within 3 Days)  Patient/caregiver understands and will follow disposition?: Patient has several complaints- but this is her priority complaint   Reason for Disposition  Breast lump  Answer Assessment - Initial Assessment Questions 1. SYMPTOM: What's the main symptom you're concerned about?  (e.g., lump, pain, rash, nipple discharge)     Breast lump- R  2. LOCATION: Where is the lump located?     12 o'clock 3. ONSET: When did lump  start?     2 weeks 4. PRIOR HISTORY: Do you have any history of prior problems with your breasts? (e.g., lumps, cancer, fibrocystic breast disease)     no 5. CAUSE: What do you think is causing this symptom?     unsure 6. OTHER SYMPTOMS: Do you have any other symptoms? (e.g., fever, breast pain, redness or rash, nipple discharge)     Painful, getting larger- unsure 7. PREGNANCY-BREASTFEEDING: Is there any chance you are pregnant? When was your last menstrual period? Are you breastfeeding?     na  Protocols used: Breast Symptoms-A-AH     Copied from CRM [id number]. Topic: Clinical - Red Word Triage >> Oct 09, 2023  8:41 AM Miquel SAILOR wrote: Red Word that prompted transfer to Nurse Triage: Chest pain/RT somthing there 1 Week / Worse 1-2 days

## 2023-10-09 NOTE — Telephone Encounter (Signed)
 Message routed to PCP Arnetha Bhat, NP as Arlie Lain.

## 2023-10-09 NOTE — Telephone Encounter (Signed)
Called patient and no answer. Voicemail was left with office call back number.   

## 2023-10-09 NOTE — Telephone Encounter (Signed)
 Recommend having yearly mammogram. Mammogram should be done within next few weeks, preferably 1-2 if possible. I do not see any mammogram studies past 2023. She is a patient with [facility]. Appears she was having mammograms with them. She can contact them and schedule OR I can place orders with [facility].

## 2023-10-09 NOTE — Telephone Encounter (Signed)
 I called patient and she stated that she wants to be seen in person because her Mammogram wasn't the only thing concerning her when she spoke on the phone with E2C[facility]. Patient states that she also complained of having bad stomach cramps. Patient will keep scheduled appointment 10/10/2023 and address concerns with PCP Gil, [name], NP during this time. Message routed back to PCP as FYI.

## 2023-10-10 ENCOUNTER — Encounter: Payer: Self-pay | Admitting: Orthopedic Surgery

## 2023-10-10 ENCOUNTER — Ambulatory Visit (INDEPENDENT_AMBULATORY_CARE_PROVIDER_SITE_OTHER): Admitting: Orthopedic Surgery

## 2023-10-10 VITALS — BP 118/86 | HR 70 | Temp 97.5°F | Ht 65.0 in | Wt 197.0 lb

## 2023-10-10 DIAGNOSIS — K30 Functional dyspepsia: Secondary | ICD-10-CM

## 2023-10-10 DIAGNOSIS — E1165 Type 2 diabetes mellitus with hyperglycemia: Secondary | ICD-10-CM

## 2023-10-10 DIAGNOSIS — K0889 Other specified disorders of teeth and supporting structures: Secondary | ICD-10-CM | POA: Diagnosis not present

## 2023-10-10 DIAGNOSIS — L731 Pseudofolliculitis barbae: Secondary | ICD-10-CM

## 2023-10-10 NOTE — Progress Notes (Signed)
 Careteam: Patient Care Team: [name] BRAVO, NP as PCP - General (Adult Health Nurse Practitioner) [name], MD as PCP - Cardiology (Cardiology) [name], MD as PCP - Electrophysiology (Cardiology) Boneta, [name], PA-C (Family Medicine) Gordy Channing LABOR, RN as Registered Nurse Delene Thersia JINNY georgann DESIREE Care Management  Seen by: [name], AGNP-C  PLACE OF SERVICE:  [facility]  Advanced Directive information    Allergies  Allergen Reactions   Latex Hives   Other Other (See Comments), Anaphylaxis, Swelling and Hives    Hair glue causes throat to close and hives glue Hair glue causes throat to close    Codeine  Nausea And Vomiting    Was on an empty stomach.   Nitrofurantoin Rash    Chief Complaint  Patient presents with   Breast Mass    Lump on right side of the breast     HPI: Patient is a 48 y.o. female seen today for acute visit due to lump on right breast.   Discussed the use of AI scribe software for clinical note transcription with the patient, who gave verbal consent to proceed.  History of Present Illness    She noticed a painful bump in her right breast approximately two weeks ago. The bump has been worsening in pain. Bump is located between breasts, touching right breast. Size of peppercorn. Tenderness when touched. Admits to sweating a lot during job.   Last mammogram was 2023, followed by [facility] in [place], [place].   She is currently taking amoxicillin, prescribed by her dentist following a dental procedure, and has been on it for two weeks, taking it every eight hours except during the night. She does not know current dose or duration. She reports having 1/2 bottle left.   Cholesterol medication recently switched from atorvastatin  to rosuvastatin  due to insurance coverage issues.  She experiences intermittent nausea and heartburn, waking up with pain in the lower part of her stomach and chest. She is on Ozempic,  recently increased to 2 mg, starting this dose on the past Sunday. Previous episodes of nausea and reduced appetite occurred with medication adjustments, but currently, she is not experiencing severe nausea. She is not eating much because medication does not make her hungry. We discussed small meals throughout the day to help with symptoms.    Review of Systems:  Review of Systems  Constitutional: Negative.   HENT: Negative.    Respiratory: Negative.    Cardiovascular: Negative.   Gastrointestinal:  Positive for heartburn, nausea and vomiting. Negative for abdominal pain, blood in stool, constipation and diarrhea.  Genitourinary: Negative.   Musculoskeletal:  Positive for joint pain. Negative for falls.  Skin:        Painful bump  Endo/Heme/Allergies:  Negative for polydipsia.  Psychiatric/Behavioral: Negative.      Past Medical History:  Diagnosis Date   Anemia 09/2015   Anoxic brain injury (HCC)    Arthritis    knees (04/23/2017)   Asthma    teens; went away; came back (04/23/2017)   B12 deficiency anemia 04/23/2017   Cardiac arrest (HCC)    Chronic bronchitis (HCC)    Chronic low back pain with sciatica    Chronic lower back pain    Diabetes mellitus without complication (HCC)    Diabetes type 2, controlled (HCC)    Elevated ferritin level    Fatty liver    GERD (gastroesophageal reflux disease)    GERD with stricture  Headache    1-2/wk (04/23/2017)   History of blood transfusion plenty   related to anemia (04/23/2017)   Hypertension    Inguinal hernia    Low back pain    Migraine    1-2/month (04/23/2017)   OA (osteoarthritis) of knee--left    Paroxysmal atrial fibrillation (HCC)    Sub-clinical -seen on device?, elevated CHADSVASC   Symptomatic anemia 04/23/2017   Vitamin B 12 deficiency    Past Surgical History:  Procedure Laterality Date   ABDOMINAL HYSTERECTOMY  YRS AGO   COMPLETE   CESAREAN SECTION  1998; 2002   ESOPHAGEAL DILATION  02/2020   by  Dr Marvis   INGUINAL HERNIA REPAIR Bilateral 1980s    total of 4 surgeries (04/23/2017)   IR GASTROSTOMY TUBE MOD SED  12/13/2020   KNEE ARTHROSCOPY WITH MEDIAL MENISECTOMY Left 01/30/2018   Procedure: LEFT KNEE ARTHROSCOPY WITH PARTIAL LATERAL MENISCECTOMY;  Surgeon: Vernetta Lonni GRADE, MD;  Location: [facility];  Service: Orthopedics;  Laterality: Left;   KNEE CARTILAGE SURGERY Left    SUBQ ICD IMPLANT N/A 09/13/2021   Procedure: SUBQ ICD IMPLANT;  Surgeon: [name], MD;  Location: [facility] INVASIVE CV LAB;  Service: Cardiovascular;  Laterality: N/A;   TUBAL LIGATION  2002   Social History:   reports that she has quit smoking. Her smoking use included cigarettes. She has never used smokeless tobacco. She reports current alcohol use. She reports current drug use. Drug: Marijuana.  Family History  Problem Relation Age of Onset   Stroke Mother    Diabetes Mother    Diabetes Father    Stroke Maternal Grandmother    Diabetes Maternal Grandmother    Anemia Paternal Grandmother    Valvular heart disease Paternal Grandmother    Hypertension Mother    Prostate cancer Maternal Uncle        ? intestinal also    Medications: Patient's Medications  New Prescriptions   No medications on file  Previous Medications   ALBUTEROL  (VENTOLIN  HFA) 108 (90 BASE) MCG/ACT INHALER    Inhale 2 puffs into the lungs every 6 (six) hours as needed for wheezing or shortness of breath.   ALUM & MAG HYDROXIDE-SIMETH (MAALOX MAX) 400-400-40 MG/5ML SUSPENSION    Take 5 mLs by mouth every 6 (six) hours as needed for indigestion.   BD PEN NEEDLE NANO 2ND GEN 32G X 4 MM MISC    3 (three) times daily. as directed   CARVEDILOL  (COREG ) 6.25 MG TABLET    Take 1 tablet (6.25 mg total) by mouth 2 (two) times daily.   ELIQUIS  5 MG TABS TABLET    TAKE 1 TABLET(5 MG) BY MOUTH TWICE DAILY   GABAPENTIN  (NEURONTIN ) 100 MG CAPSULE    Take 2 capsules (200 mg total) by mouth 2 (two) times daily.   HYDROXYZINE  (ATARAX ) 10  MG TABLET    Take 1 tablet (10 mg total) by mouth at bedtime.   JARDIANCE  10 MG TABS TABLET    TAKE 1 TABLET(10 MG) BY MOUTH EVERY MORNING   LOSARTAN  (COZAAR ) 25 MG TABLET    TAKE 1 TABLET BY MOUTH DAILY   METFORMIN (FORTAMET) 500 MG (OSM) 24 HR TABLET    Take 1,000 mg by mouth daily with breakfast.   OXYCODONE  (ROXICODONE ) 5 MG IMMEDIATE RELEASE TABLET    Take 1 tablet (5 mg total) by mouth every 6 (six) hours as needed for severe pain (pain score 7-10).   OZEMPIC, 0.25 OR 0.5 MG/DOSE, 2 MG/3ML  SOPN    Inject 0.5 mg into the skin once a week.   POLYETHYLENE GLYCOL (MIRALAX ) 17 G PACKET    Take 17 g by mouth daily.   QUETIAPINE  (SEROQUEL ) 100 MG TABLET    TAKE 1 TABLET(100 MG) BY MOUTH AT BEDTIME   RIVASTIGMINE  (EXELON ) 3 MG CAPSULE    TAKE 1 CAPSULE(3 MG) BY MOUTH TWICE DAILY   ROSUVASTATIN  (CRESTOR ) 20 MG TABLET    Take 1.5 tablets (30 mg total) by mouth daily.   SERTRALINE  (ZOLOFT ) 100 MG TABLET    TAKE 1 TABLET(100 MG) BY MOUTH DAILY  Modified Medications   No medications on file  Discontinued Medications   No medications on file    Physical Exam:  Vitals:   10/10/23 0836  BP: 118/86  Pulse: 70  Temp: (!) 97.5 F (36.4 C)  SpO2: 97%  Weight: 197 lb (89.4 kg)  Height: 5' 5 (1.651 m)   Body mass index is 32.78 kg/m. Wt Readings from Last 3 Encounters:  10/10/23 197 lb (89.4 kg)  10/02/23 194 lb 12.8 oz (88.4 kg)  09/27/23 210 lb (95.3 kg)    Physical Exam Vitals reviewed.  Constitutional:      General: She is not in acute distress. HENT:     Head: Normocephalic.   Eyes:     General:        Right eye: No discharge.        Left eye: No discharge.    Cardiovascular:     Rate and Rhythm: Normal rate and regular rhythm.     Pulses: Normal pulses.     Heart sounds: Normal heart sounds.  Pulmonary:     Effort: Pulmonary effort is normal. No respiratory distress.     Breath sounds: Normal breath sounds. No wheezing or rales.  Abdominal:     General: There is no  distension.     Palpations: Abdomen is soft.     Tenderness: There is no abdominal tenderness.   Musculoskeletal:     Cervical back: Neck supple.     Right lower leg: No edema.     Left lower leg: No edema.   Skin:    General: Skin is warm.     Capillary Refill: Capillary refill takes less than 2 seconds.     Comments: Approx < 0.25 cm round tender nodule between breasts, touching right breast, no white center/swelling/erythema/drainage   Neurological:     General: No focal deficit present.     Mental Status: She is alert and oriented to person, place, and time.   Psychiatric:        Mood and Affect: Mood normal.     Labs reviewed: Basic Metabolic Panel: Recent Labs    12/06/22 1052 01/08/23 1117 03/05/23 1415 06/13/23 1552 09/27/23 2123  NA 136   < > 139 138 139  K 4.1   < > 4.0 4.1 3.7  CL 104   < > 106 106 104  CO2 21   < > 26 26 21*  GLUCOSE 127   < > 106* 149* 74  BUN 11   < > 13 12 14   CREATININE 0.91   < > 0.70 0.74 0.77  CALCIUM  9.1   < > 9.3 9.4 9.4  TSH 0.44  --   --   --   --    < > = values in this interval not displayed.   Liver Function Tests: Recent Labs    01/08/23 1117 06/13/23 1552 09/27/23  2123  AST 22 14 123*  ALT 19 18 204*  ALKPHOS 97  --  319*  BILITOT 0.4 0.3 0.5  PROT 7.0 7.1 8.1  ALBUMIN 3.6  --  4.5   Recent Labs    03/05/23 1415  LIPASE 29   No results for input(s): AMMONIA in the last 8760 hours. CBC: Recent Labs    06/13/23 1552 07/12/23 1618 09/27/23 2123  WBC 8.1 7.1 8.1  NEUTROABS 4,082 3,110 4.0  HGB 13.6 12.9 13.8  HCT 45.5* 43.6 44.1  MCV 73.3* 74.3* 70.7*  PLT 281 281 230   Lipid Panel: Recent Labs    01/09/23 0454 04/25/23 0853  CHOL 159 157  HDL 38* 49*  LDLCALC 104* 92  TRIG 85 72  CHOLHDL 4.2 3.2   TSH: Recent Labs    12/06/22 1052  TSH 0.44   A1C: Lab Results  Component Value Date   HGBA1C 7.1 (H) 01/09/2023     Assessment/Plan 1. Ingrown hair (Primary) - began about 2  weeks ago - located between breasts, touching right - peppercorn sized, no white head/erythema/swelling/drainage - suspect ingrown hair due to tenderness/appearance - on amoxicillin due to recent dental procedure - recommend warm compress 20 min for 2-3 times daily - if no improvement or worsens> consider doxycycline   2. Pain, dental - on amoxicillin> unable to report dose or duration - off pain medication  3. Controlled type 2 diabetes mellitus with hyperglycemia, without long-term current use of insulin  (HCC) - recent A1c 5.9 - followed by [facility] - Ozempic increased to 2 mg weekly  4. Acid indigestion - began after Ozempic increase - discussed small frequent meals 3-4 times daily - do not recommend PPI - discussed trying increased diet and TUMS prn   Total time: 32 minutes. Greater than 50% of total time spent doing patient education regarding ingrown hair, dental pain, T2DM, and indigestion including symptom/medication management.      Next appt: 10/17/2023  Greig Cluster, ELNITA  [facility] [phone]

## 2023-10-10 NOTE — Patient Instructions (Addendum)
 Please come fasting to next week appointment> nothing to eat 12 hours before blood drawn> ok to have water  of black coffee only  Please schedule yearly checkup/mammogram with your gynecologist> [phone]  Your cholesterol was changes to rosuvastatin  due to insurance cost/coverage  Eat small meals every 3-4 hours due to Ozempic diabetes injectable>if you have indigestion> try one TUMS> if you are eating TUMS more than 2-3 times daily let me know  Ingrown hair near breast> apply warm wash cloth on area for 20 minutes twice daily> try to scrub in shower> if no improvement or it gets worse> call office and I will prescribe different antibiotic

## 2023-10-14 ENCOUNTER — Telehealth (HOSPITAL_BASED_OUTPATIENT_CLINIC_OR_DEPARTMENT_OTHER): Admitting: Psychiatry

## 2023-10-14 ENCOUNTER — Encounter (HOSPITAL_COMMUNITY): Payer: Self-pay | Admitting: Psychiatry

## 2023-10-14 VITALS — Wt 197.0 lb

## 2023-10-14 DIAGNOSIS — R4189 Other symptoms and signs involving cognitive functions and awareness: Secondary | ICD-10-CM

## 2023-10-14 DIAGNOSIS — R413 Other amnesia: Secondary | ICD-10-CM | POA: Diagnosis not present

## 2023-10-14 DIAGNOSIS — F321 Major depressive disorder, single episode, moderate: Secondary | ICD-10-CM | POA: Diagnosis not present

## 2023-10-14 DIAGNOSIS — F1290 Cannabis use, unspecified, uncomplicated: Secondary | ICD-10-CM

## 2023-10-14 DIAGNOSIS — R29818 Other symptoms and signs involving the nervous system: Secondary | ICD-10-CM | POA: Diagnosis not present

## 2023-10-14 MED ORDER — LAMOTRIGINE 25 MG PO TABS: 25.0000 mg | ORAL_TABLET | Freq: Two times a day (BID) | ORAL | 1 refills | Status: DC

## 2023-10-14 NOTE — Progress Notes (Signed)
 Psychiatric Initial Adult Assessment   Virtual Visit via Video Note  I connected with Courtney Davies on 10/14/23 at  1:00 PM EDT by a video enabled telemedicine application and verified that I am speaking with the correct person using two identifiers.  Location: Patient: Work Provider: Home Office    Patient Identification: Courtney Davies MRN:  [id number] Date of Evaluation:  10/14/2023 Referral Source: PCP Chief Complaint:   Chief Complaint  Patient presents with   Establish Care   Visit Diagnosis:    ICD-10-CM   1. MDD (major depressive disorder), single episode, moderate (HCC)  F32.1 lamoTRIgine (LAMICTAL) 25 MG tablet    2. Cannabis use disorder  F12.90 lamoTRIgine (LAMICTAL) 25 MG tablet    3. Neurocognitive deficits  R29.818 lamoTRIgine (LAMICTAL) 25 MG tablet   R41.89     4. Memory impairment  R41.3 lamoTRIgine (LAMICTAL) 25 MG tablet      History of Present Illness: Patient is 48 year old African-American employed female who is referred from primary care.  Apparently patient had cardiac arrest 3 years ago and she had a fall from the height and had anoxic brain injury with neurocognitive complications of forgetfulness, impaired memory, irritability and emotional dysregulation.  Patient did not provide a lot of information and told that everything should be in the chart.  And ask about current medicine she again response everything should be in the chart.  She is not sure who made appointment with psychiatrist but like to get better because she experience irritability, a lot of emotions, dysphoria, short temper and anger issues.  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  She like to go back to her baseline prior to injury.  She noticed her memory continue to decline and sometimes she is disrespectful with the family and coworkers.  She will lash out and not no one wants to be around her.  She  likes her job and she has very limited social network who understand her job and her illness.  Her mother fixes the medication.  She do not know the details and the need the medication.  Initially she mentioned that she had never seen psychiatrist however after reviewing her chart it appears that she saw Dr. Doylene in February 2025 his initial appointment but not sure why she did not follow-up with psychiatrist.  When asked about therapist she does not remember very well but recall she is seeing someone.  She is not able to recall the name of a cardiologist and neurologist but remember her primary care doctor.  She admitted sleep is not very well but recently since his doctors change the medication she is sleeping at least 7-8 hours.  She denies any hallucination, paranoia, violence or delusions.  She is on Seroquel  100 mg at bedtime and Zoloft  to 100 mg and both of these medicines were increased by previous psychiatrist.  She is not sure if this medicine helped but sleeping better.  She also admitted to smoking marijuana on a daily basis because she feel it helps to calm down and wishes it can be prescribed by the doctor to take on a daily basis.  She reported history of diabetes, neuropathy but last blood work was normal.  I reviewed blood work her liver enzymes are very high.  She is not aware about it.  She requires some assistance for her ADLs.  Son helps finances and mother helps  medication box.  Currently had a blood work and her hemoglobin A1c 5.9 but her liver enzymes are very high which she is not aware about.  She reported chronic symptoms of depression which include anhedonia, lack of interest to do things.  She feels sometimes hopeless but denies any active suicidal thoughts or homicidal thoughts.  She denies drinking or using any other illegal substances.  She report her appetite is okay and her weight is stable.  Associated Signs/Symptoms: Depression Symptoms:  depressed  mood, anhedonia, insomnia, fatigue, difficulty concentrating, hopelessness, loss of energy/fatigue, disturbed sleep, (Hypo) Manic Symptoms:  Distractibility, Elevated Mood, Impulsivity, Irritable Mood, Labiality of Mood, Anxiety Symptoms:  Excessive Worry, Psychotic Symptoms:  none reported PTSD Symptoms: Negative  Past Psychiatric History: Reviewed.  No history of psychiatric inpatient treatment.  History of cutting her wrist in 2022 after feeling better herself.  As per chart Celexa  and Depakote  was given in 2022 during hospitalization on medical floor.  Saw once Dr. Keren once but never had follow-up.    Previous Psychotropic Medications: Yes   Substance Abuse History in the last 12 months:  Yes.    Consequences of Substance Abuse: NA  Past Medical History:  Past Medical History:  Diagnosis Date   Anemia 09/2015   Anoxic brain injury (HCC)    Arthritis    knees (04/23/2017)   Asthma    teens; went away; came back (04/23/2017)   B12 deficiency anemia 04/23/2017   Cardiac arrest (HCC)    Chronic bronchitis (HCC)    Chronic low back pain with sciatica    Chronic lower back pain    Diabetes mellitus without complication (HCC)    Diabetes type 2, controlled (HCC)    Elevated ferritin level    Fatty liver    GERD (gastroesophageal reflux disease)    GERD with stricture    Headache    1-2/wk (04/23/2017)   History of blood transfusion plenty   related to anemia (04/23/2017)   Hypertension    Inguinal hernia    Low back pain    Migraine    1-2/month (04/23/2017)   OA (osteoarthritis) of knee--left    Paroxysmal atrial fibrillation (HCC)    Sub-clinical -seen on device?, elevated CHADSVASC   Symptomatic anemia 04/23/2017   Vitamin B 12 deficiency     Past Surgical History:  Procedure Laterality Date   ABDOMINAL HYSTERECTOMY  YRS AGO   COMPLETE   CESAREAN SECTION  1998; 2002   ESOPHAGEAL DILATION  02/2020   by Dr Marvis   INGUINAL HERNIA REPAIR  Bilateral 1980s    total of 4 surgeries (04/23/2017)   IR GASTROSTOMY TUBE MOD SED  12/13/2020   KNEE ARTHROSCOPY WITH MEDIAL MENISECTOMY Left 01/30/2018   Procedure: LEFT KNEE ARTHROSCOPY WITH PARTIAL LATERAL MENISCECTOMY;  Surgeon: Vernetta Lonni GRADE, MD;  Location: [facility];  Service: Orthopedics;  Laterality: Left;   KNEE CARTILAGE SURGERY Left    SUBQ ICD IMPLANT N/A 09/13/2021   Procedure: SUBQ ICD IMPLANT;  Surgeon: [name], MD;  Location: [facility] INVASIVE CV LAB;  Service: Cardiovascular;  Laterality: N/A;   TUBAL LIGATION  2002    Family Psychiatric History: Reviewed  Family History:  Family History  Problem Relation Age of Onset   Stroke Mother    Diabetes Mother    Diabetes Father    Stroke Maternal Grandmother    Diabetes Maternal Grandmother    Anemia Paternal Grandmother    Valvular heart disease Paternal Grandmother    Hypertension  Mother    Prostate cancer Maternal Uncle        ? intestinal also    Social History:   Social History   Socioeconomic History   Marital status: Single    Spouse name: Not on file   Number of children: 2   Years of education: 11th   Highest education level: Not on file  Occupational History   Occupation: Scientist, product/process development in restaurant  Tobacco Use   Smoking status: Former    Types: Cigarettes   Smokeless tobacco: Never  Vaping Use   Vaping status: Never Used  Substance and Sexual Activity   Alcohol use: Yes    Comment: occ   Drug use: Yes    Types: Marijuana    Comment: daily   Sexual activity: Not Currently    Birth control/protection: Surgical  Other Topics Concern   Not on file  Social History Narrative   ** Merged History Encounter **       Lives at home with her two children. Occasional caffeine use. Right-handed.   Social Drivers of Health   Financial Resource Strain: High Risk (08/02/2023)   Overall Financial Resource Strain (CARDIA)    Difficulty of Paying Living Expenses: Very hard  Food  Insecurity: Food Insecurity Present (08/01/2023)   Hunger Vital Sign    Worried About Running Out of Food in the Last Year: Sometimes true    Ran Out of Food in the Last Year: Sometimes true  Transportation Needs: No Transportation Needs (06/25/2023)   PRAPARE - Administrator, Civil Service (Medical): No    Lack of Transportation (Non-Medical): No  Recent Concern: Transportation Needs - Unmet Transportation Needs (05/15/2023)   PRAPARE - Transportation    Lack of Transportation (Medical): Yes    Lack of Transportation (Non-Medical): Yes  Physical Activity: Not on file  Stress: Stress Concern Present (05/15/2023)   [employer] of Occupational Health - Occupational Stress Questionnaire    Feeling of Stress : To some extent  Social Connections: Unknown (07/04/2023)   Social Connection and Isolation Panel    Frequency of Communication with Friends and Family: Never    Frequency of Social Gatherings with Friends and Family: Never    Attends Religious Services: 1 to 4 times per year    Active Member of [employer] or Organizations: No    Attends Banker Meetings: Never    Marital Status: Patient declined    Additional Social History: Lives with her 2 grown son.  Mother helps to fix medication box.  Son helps finances.  Patient works as a Stage manager at [employer]  Allergies:   Allergies  Allergen Reactions   Latex Hives   Other Other (See Comments), Anaphylaxis, Swelling and Hives    Hair glue causes throat to close and hives glue Hair glue causes throat to close    Codeine  Nausea And Vomiting    Was on an empty stomach.   Nitrofurantoin Rash    Metabolic Disorder Labs: Lab Results  Component Value Date   HGBA1C 7.1 (H) 01/09/2023   MPG 157.07 01/09/2023   MPG 163 10/17/2022   No results found for: PROLACTIN Lab Results  Component Value Date   CHOL 157 04/25/2023   TRIG 72 04/25/2023   HDL 49 (L) 04/25/2023   CHOLHDL 3.2 04/25/2023    VLDL 17 01/09/2023   LDLCALC 92 04/25/2023   LDLCALC 104 (H) 01/09/2023   Lab Results  Component Value Date   TSH 0.44  12/06/2022    Therapeutic Level Labs: No results found for: LITHIUM No results found for: CBMZ Lab Results  Component Value Date   VALPROATE 51 01/19/2021    Current Medications: Current Outpatient Medications  Medication Sig Dispense Refill   albuterol  (VENTOLIN  HFA) 108 (90 Base) MCG/ACT inhaler Inhale 2 puffs into the lungs every 6 (six) hours as needed for wheezing or shortness of breath. 18 g 2   alum & mag hydroxide-simeth (MAALOX MAX) 400-400-40 MG/5ML suspension Take 5 mLs by mouth every 6 (six) hours as needed for indigestion. 355 mL 0   BD PEN NEEDLE NANO 2ND GEN 32G X 4 MM MISC 3 (three) times daily. as directed     carvedilol  (COREG ) 6.25 MG tablet Take 1 tablet (6.25 mg total) by mouth 2 (two) times daily. 180 tablet 1   ELIQUIS  5 MG TABS tablet TAKE 1 TABLET(5 MG) BY MOUTH TWICE DAILY 60 tablet 5   gabapentin  (NEURONTIN ) 100 MG capsule Take 2 capsules (200 mg total) by mouth 2 (two) times daily. 120 capsule 3   hydrOXYzine  (ATARAX ) 10 MG tablet Take 1 tablet (10 mg total) by mouth at bedtime. 90 tablet 1   JARDIANCE  10 MG TABS tablet TAKE 1 TABLET(10 MG) BY MOUTH EVERY MORNING 90 tablet 1   losartan  (COZAAR ) 25 MG tablet TAKE 1 TABLET BY MOUTH DAILY 90 tablet 3   metformin (FORTAMET) 500 MG (OSM) 24 hr tablet Take 1,000 mg by mouth daily with breakfast.     OZEMPIC, 0.25 OR 0.5 MG/DOSE, 2 MG/3ML SOPN Inject 0.5 mg into the skin once a week.     QUEtiapine  (SEROQUEL ) 100 MG tablet TAKE 1 TABLET(100 MG) BY MOUTH AT BEDTIME 30 tablet 6   rivastigmine  (EXELON ) 3 MG capsule TAKE 1 CAPSULE(3 MG) BY MOUTH TWICE DAILY 60 capsule 6   rosuvastatin  (CRESTOR ) 20 MG tablet Take 1.5 tablets (30 mg total) by mouth daily. 135 tablet 3   sertraline  (ZOLOFT ) 100 MG tablet TAKE 1 TABLET(100 MG) BY MOUTH DAILY 30 tablet 2   No current facility-administered  medications for this visit.    Musculoskeletal: Strength & Muscle Tone: decreased Gait & Station: normal Patient leans: N/A  Psychiatric Specialty Exam: Review of Systems  Neurological:  Positive for numbness and headaches.  Psychiatric/Behavioral:  Positive for dysphoric mood.     Weight 197 lb (89.4 kg), last menstrual period 12/03/2012.There is no height or weight on file to calculate BMI.  General Appearance: Casual and Fairly Groomed  Eye Contact:  Fair  Speech:  Normal Rate  Volume:  Normal  Mood:  Dysphoric and Irritable  Affect:  Labile  Thought Process:  Descriptions of Associations: Loose  Orientation:  Full (Time, Place, and Person)  Thought Content:  Rumination  Suicidal Thoughts:  No  Homicidal Thoughts:  No  Memory:  Immediate;   Fair Recent;   Fair Remote;   Fair  Judgement:  Fair  Insight:  Shallow  Psychomotor Activity:  Increased  Concentration:  Concentration: Fair and Attention Span: Fair  Recall:  [employer] of Knowledge:Fair  Language: Fair  Akathisia:  No  Handed:  Right  AIMS (if indicated):  not done  Assets:  Communication Skills Desire for Improvement Housing Social Support Transportation  ADL's:  Intact  Cognition: Impaired,  Mild  Sleep:  better   Screenings: GAD-7    Flowsheet Row Patient Outreach Telephone from 08/01/2023 in [place] [facility] DEPART[facility] Counselor from 05/23/2023 in [facility]  Total GAD-7 Score 13 20   Mini-Mental    Flowsheet Row Office Visit from 03/01/2022 in [facility] & Adult Medicine  Total Score (max 30 points ) 10   PHQ2-9    Flowsheet Row Office Visit from 10/02/2023 in [facility] Physical Medicine and Rehabilitation Patient Outreach Telephone from 08/01/2023 in [place] POPULATION HEALTH DEPARTMENT Office Visit from 07/04/2023 in [facility] & Adult Medicine Office Visit from 06/13/2023 in [facility] Senior  Care & Adult Medicine Counselor from 05/23/2023 in [facility]  PHQ-2 Total Score 2 2 2 2 4   PHQ-9 Total Score -- 6 9 3 14    Flowsheet Row ED from 09/27/2023 in [facility] Emergency Department at [facility] ED from 09/24/2023 in [facility] Emergency Department at [facility] ED from 05/05/2023 in [facility] Emergency Department at [facility]  C-SSRS RISK CATEGORY No Risk No Risk No Risk    Assessment and Plan: Patient is 48 year old female with multiple health issues including hypertension, diabetes, V-fib arrest and anoxic brain injury in 2022 required extensive hospitalization status post ICD.  Sleep apnea, B12 deficiency, chronic back pain, memory impairment, cannabis use and depression.  I have a long discussion with the patient about her chronic health needs.  Discussed we can address helping her mood and irritability if current medicine is not working.  As per EMR she is taking Seroquel  100 mg at bedtime, Zoloft  100 mg daily prescribed by psychiatrist.  She is not sure about hydroxyzine .  She is taking gabapentin  200 mg twice a day prescribed by PCP/neurology.  She is seeing cardiologist, neurologist, PCP.  I reviewed blood work.  Her liver enzymes are very high however patient is not aware about it.  I will forward my note to her PCP.  Recommend to trial low-dose Lamictal to help with debility.  I explained if she noticed rash then she to stop the medication immediately.  I explained sometimes can cause headaches but it should be for short-term.  Discussed safety concerns at any time having rash with the Lamictal and she need to stop the medication immediately.  For now she will continue Seroquel  and Zoloft  at present dose.  Discussed to keep appointment with a therapist and recommend to discontinue cannabis use because of interaction with psychotropic medication and effects on the body.  Encouraged to keep appointment with primary care she is to  see therapist but not sure if she has any appointment and she does not know the name.  I recommend to reach out to her primary care.  I see the note of Cozart paige social worker and I will also forward my note to her.  Discussed the nighttime having active suicidal thoughts or homicidal thought that she need to call 911 or go to local [place].  Will follow-up in 6 weeks.  Collaboration of Care: Medication Management AEB notes available in epic to review.  Patient/Guardian was advised Release of Information must be obtained prior to any record release in order to collaborate their care with an outside provider. Patient/Guardian was advised if they have not already done so to contact the registration department to sign all necessary forms in order for us  to release information regarding their care.   Consent: Patient/Guardian gives verbal consent for treatment and assignment of benefits for services provided during this visit. Patient/Guardian expressed understanding and agreed to proceed.   Leni ONEIDA Client, MD 6/30/202512:59 PM

## 2023-10-17 ENCOUNTER — Ambulatory Visit: Admitting: Orthopedic Surgery

## 2023-10-17 ENCOUNTER — Ambulatory Visit (INDEPENDENT_AMBULATORY_CARE_PROVIDER_SITE_OTHER): Payer: Medicare Other | Admitting: Orthopedic Surgery

## 2023-10-17 ENCOUNTER — Encounter: Admitting: Physician Assistant

## 2023-10-17 ENCOUNTER — Ambulatory Visit (INDEPENDENT_AMBULATORY_CARE_PROVIDER_SITE_OTHER)

## 2023-10-17 ENCOUNTER — Encounter: Payer: Self-pay | Admitting: Orthopedic Surgery

## 2023-10-17 VITALS — BP 122/84 | HR 70 | Temp 97.2°F | Resp 12 | Ht 65.0 in | Wt 194.0 lb

## 2023-10-17 DIAGNOSIS — L731 Pseudofolliculitis barbae: Secondary | ICD-10-CM | POA: Diagnosis not present

## 2023-10-17 DIAGNOSIS — I469 Cardiac arrest, cause unspecified: Secondary | ICD-10-CM | POA: Diagnosis not present

## 2023-10-17 DIAGNOSIS — R748 Abnormal levels of other serum enzymes: Secondary | ICD-10-CM

## 2023-10-17 DIAGNOSIS — I10 Essential (primary) hypertension: Secondary | ICD-10-CM | POA: Diagnosis not present

## 2023-10-17 DIAGNOSIS — R310 Gross hematuria: Secondary | ICD-10-CM | POA: Diagnosis not present

## 2023-10-17 DIAGNOSIS — G931 Anoxic brain damage, not elsewhere classified: Secondary | ICD-10-CM

## 2023-10-17 DIAGNOSIS — E1165 Type 2 diabetes mellitus with hyperglycemia: Secondary | ICD-10-CM

## 2023-10-17 DIAGNOSIS — I639 Cerebral infarction, unspecified: Secondary | ICD-10-CM

## 2023-10-17 DIAGNOSIS — J4520 Mild intermittent asthma, uncomplicated: Secondary | ICD-10-CM | POA: Diagnosis not present

## 2023-10-17 LAB — COMPREHENSIVE METABOLIC PANEL WITH GFR
AG Ratio: 1.3 (calc) (ref 1.0–2.5)
ALT: 115 U/L — ABNORMAL HIGH (ref 6–29)
AST: 48 U/L — ABNORMAL HIGH (ref 10–35)
Albumin: 4.2 g/dL (ref 3.6–5.1)
Alkaline phosphatase (APISO): 516 U/L — ABNORMAL HIGH (ref 31–125)
BUN: 14 mg/dL (ref 7–25)
CO2: 24 mmol/L (ref 20–32)
Calcium: 9.4 mg/dL (ref 8.6–10.2)
Chloride: 106 mmol/L (ref 98–110)
Creat: 0.66 mg/dL (ref 0.50–0.99)
Globulin: 3.2 g/dL (ref 1.9–3.7)
Glucose, Bld: 86 mg/dL (ref 65–99)
Potassium: 4.3 mmol/L (ref 3.5–5.3)
Sodium: 139 mmol/L (ref 135–146)
Total Bilirubin: 0.5 mg/dL (ref 0.2–1.2)
Total Protein: 7.4 g/dL (ref 6.1–8.1)
eGFR: 109 mL/min/{1.73_m2} (ref >=60)

## 2023-10-17 LAB — LIPID PANEL
Cholesterol: 122 mg/dL (ref <200)
HDL: 53 mg/dL (ref >=50)
LDL Cholesterol (Calc): 55 mg/dL
Non-HDL Cholesterol (Calc): 69 mg/dL (ref <130)
Total CHOL/HDL Ratio: 2.3 (calc) (ref <5.0)
Triglycerides: 62 mg/dL (ref <150)

## 2023-10-17 MED ORDER — DOXYCYCLINE HYCLATE 100 MG PO TABS: 100.0000 mg | ORAL_TABLET | Freq: Two times a day (BID) | ORAL | 0 refills | Status: AC

## 2023-10-17 NOTE — Progress Notes (Signed)
 Careteam: Patient Care Team: [name] BRAVO, NP as PCP - General (Adult Health Nurse Practitioner) [name], MD as PCP - Cardiology (Cardiology) [name], MD as PCP - Electrophysiology (Cardiology) Boneta, [name], PA-C (Family Medicine) Gordy Channing LABOR, RN as Registered Nurse Delene Thersia JINNY georgann DESIREE Care Management  Seen by: [name], AGNP-C  PLACE OF SERVICE:  [facility]  Advanced Directive information    Allergies  Allergen Reactions   Latex Hives   Other Other (See Comments), Anaphylaxis, Swelling and Hives    Hair glue causes throat to close and hives glue Hair glue causes throat to close    Codeine  Nausea And Vomiting    Was on an empty stomach.   Nitrofurantoin Rash    No chief complaint on file.    HPI: Patient is a 48 y.o. female seen today for medical management of chronic conditions.   Fasting for lab work today, plan to recheck lipid panel. Recently switched to rosuvastatin  from atorvastatin  due to insurance coverage. 06/13 AST/ALT 123/204, alk phos 319. Admits to lower abdominal cramping at times. Ozempic was also recently increased by endocrinology. She reports Ozempic makes her feel nauseous at times as well. Does not drink alcohol. Does not use tylenol .   BP elevated, she did not take carvedilol  and losartan  this morning due to fasting labs.   No recent asthma exacerbations, using albuterol  prn.   She has yearly gynecology f/u scheduled.   Left uni knee scheduled with Dr. Jerri 12/2023.   Ingrown hair to right breast with some improvement. No tenderness anymore. It was reduced in size. She has been doing heat applications.   Followed by endocrinology for T2DM, remains on metformin and Ozempic. Does not check sugars.     Review of Systems:  Review of Systems  Constitutional: Negative.   HENT: Negative.    Respiratory:  Negative for shortness of breath.   Cardiovascular:  Negative for chest pain.   Gastrointestinal:  Positive for abdominal pain, heartburn and nausea. Negative for blood in stool, constipation, diarrhea and vomiting.  Genitourinary: Negative.   Musculoskeletal:  Positive for joint pain.  Skin: Negative.   Neurological:  Negative for dizziness and headaches.  Psychiatric/Behavioral:  Positive for depression and memory loss. The patient has insomnia. The patient is not nervous/anxious.     Past Medical History:  Diagnosis Date   Anemia 09/2015   Anoxic brain injury (HCC)    Arthritis    knees (04/23/2017)   Asthma    teens; went away; came back (04/23/2017)   B12 deficiency anemia 04/23/2017   Cardiac arrest (HCC)    Chronic bronchitis (HCC)    Chronic low back pain with sciatica    Chronic lower back pain    Diabetes mellitus without complication (HCC)    Diabetes type 2, controlled (HCC)    Elevated ferritin level    Fatty liver    GERD (gastroesophageal reflux disease)    GERD with stricture    Headache    1-2/wk (04/23/2017)   History of blood transfusion plenty   related to anemia (04/23/2017)   Hypertension    Inguinal hernia    Low back pain    Migraine    1-2/month (04/23/2017)   OA (osteoarthritis) of knee--left    Paroxysmal atrial fibrillation (HCC)    Sub-clinical -seen on device?, elevated CHADSVASC   Symptomatic anemia 04/23/2017   Vitamin B 12 deficiency    Past Surgical History:  Procedure Laterality  Date   ABDOMINAL HYSTERECTOMY  YRS AGO   COMPLETE   CESAREAN SECTION  1998; 2002   ESOPHAGEAL DILATION  02/2020   by Dr Marvis FONTANA HERNIA REPAIR Bilateral 1980s    total of 4 surgeries (04/23/2017)   IR GASTROSTOMY TUBE MOD SED  12/13/2020   KNEE ARTHROSCOPY WITH MEDIAL MENISECTOMY Left 01/30/2018   Procedure: LEFT KNEE ARTHROSCOPY WITH PARTIAL LATERAL MENISCECTOMY;  Surgeon: Vernetta Lonni GRADE, MD;  Location: [facility];  Service: Orthopedics;  Laterality: Left;   KNEE CARTILAGE SURGERY Left    SUBQ ICD IMPLANT N/A  09/13/2021   Procedure: SUBQ ICD IMPLANT;  Surgeon: [name], MD;  Location: [facility] INVASIVE CV LAB;  Service: Cardiovascular;  Laterality: N/A;   TUBAL LIGATION  2002   Social History:   reports that she has quit smoking. Her smoking use included cigarettes. She has never used smokeless tobacco. She reports current alcohol use. She reports current drug use. Drug: Marijuana.  Family History  Problem Relation Age of Onset   Stroke Mother    Diabetes Mother    Diabetes Father    Stroke Maternal Grandmother    Diabetes Maternal Grandmother    Anemia Paternal Grandmother    Valvular heart disease Paternal Grandmother    Hypertension Mother    Prostate cancer Maternal Uncle        ? intestinal also    Medications: Patient's Medications  New Prescriptions   No medications on file  Previous Medications   ALBUTEROL  (VENTOLIN  HFA) 108 (90 BASE) MCG/ACT INHALER    Inhale 2 puffs into the lungs every 6 (six) hours as needed for wheezing or shortness of breath.   ALUM & MAG HYDROXIDE-SIMETH (MAALOX MAX) 400-400-40 MG/5ML SUSPENSION    Take 5 mLs by mouth every 6 (six) hours as needed for indigestion.   BD PEN NEEDLE NANO 2ND GEN 32G X 4 MM MISC    3 (three) times daily. as directed   CARVEDILOL  (COREG ) 6.25 MG TABLET    Take 1 tablet (6.25 mg total) by mouth 2 (two) times daily.   ELIQUIS  5 MG TABS TABLET    TAKE 1 TABLET(5 MG) BY MOUTH TWICE DAILY   GABAPENTIN  (NEURONTIN ) 100 MG CAPSULE    Take 2 capsules (200 mg total) by mouth 2 (two) times daily.   HYDROXYZINE  (ATARAX ) 10 MG TABLET    Take 1 tablet (10 mg total) by mouth at bedtime.   JARDIANCE  10 MG TABS TABLET    TAKE 1 TABLET(10 MG) BY MOUTH EVERY MORNING   LAMOTRIGINE (LAMICTAL) 25 MG TABLET    Take 1 tablet (25 mg total) by mouth 2 (two) times daily.   LOSARTAN  (COZAAR ) 25 MG TABLET    TAKE 1 TABLET BY MOUTH DAILY   METFORMIN (FORTAMET) 500 MG (OSM) 24 HR TABLET    Take 1,000 mg by mouth daily with breakfast.   OZEMPIC, 0.25  OR 0.5 MG/DOSE, 2 MG/3ML SOPN    Inject 0.5 mg into the skin once a week.   QUETIAPINE  (SEROQUEL ) 100 MG TABLET    TAKE 1 TABLET(100 MG) BY MOUTH AT BEDTIME   RIVASTIGMINE  (EXELON ) 3 MG CAPSULE    TAKE 1 CAPSULE(3 MG) BY MOUTH TWICE DAILY   ROSUVASTATIN  (CRESTOR ) 20 MG TABLET    Take 1.5 tablets (30 mg total) by mouth daily.   SERTRALINE  (ZOLOFT ) 100 MG TABLET    TAKE 1 TABLET(100 MG) BY MOUTH DAILY  Modified Medications   No medications on file  Discontinued Medications   No medications on file    Physical Exam:  There were no vitals filed for this visit. There is no height or weight on file to calculate BMI. Wt Readings from Last 3 Encounters:  10/10/23 197 lb (89.4 kg)  10/02/23 194 lb 12.8 oz (88.4 kg)  09/27/23 210 lb (95.3 kg)    Physical Exam Vitals reviewed.  Constitutional:      General: She is not in acute distress. HENT:     Head: Normocephalic.  Eyes:     General:        Right eye: No discharge.        Left eye: No discharge.  Cardiovascular:     Rate and Rhythm: Normal rate and regular rhythm.     Pulses: Normal pulses.     Heart sounds: Normal heart sounds.  Pulmonary:     Effort: Pulmonary effort is normal.     Breath sounds: Normal breath sounds.  Abdominal:     General: Bowel sounds are normal. There is no distension.     Palpations: Abdomen is soft. There is no mass.     Tenderness: There is no abdominal tenderness. There is no guarding or rebound.     Hernia: No hernia is present.  Musculoskeletal:     Cervical back: Neck supple.     Right lower leg: No edema.     Left lower leg: No edema.  Skin:    General: Skin is warm.     Capillary Refill: Capillary refill takes less than 2 seconds.  Neurological:     General: No focal deficit present.     Mental Status: She is alert and oriented to person, place, and time.  Psychiatric:        Mood and Affect: Mood normal.     Labs reviewed: Basic Metabolic Panel: Recent Labs    12/06/22 1052  01/08/23 1117 03/05/23 1415 06/13/23 1552 09/27/23 2123  NA 136   < > 139 138 139  K 4.1   < > 4.0 4.1 3.7  CL 104   < > 106 106 104  CO2 21   < > 26 26 21*  GLUCOSE 127   < > 106* 149* 74  BUN 11   < > 13 12 14   CREATININE 0.91   < > 0.70 0.74 0.77  CALCIUM  9.1   < > 9.3 9.4 9.4  TSH 0.44  --   --   --   --    < > = values in this interval not displayed.   Liver Function Tests: Recent Labs    01/08/23 1117 06/13/23 1552 09/27/23 2123  AST 22 14 123*  ALT 19 18 204*  ALKPHOS 97  --  319*  BILITOT 0.4 0.3 0.5  PROT 7.0 7.1 8.1  ALBUMIN 3.6  --  4.5   Recent Labs    03/05/23 1415  LIPASE 29   No results for input(s): AMMONIA in the last 8760 hours. CBC: Recent Labs    06/13/23 1552 07/12/23 1618 09/27/23 2123  WBC 8.1 7.1 8.1  NEUTROABS 4,082 3,110 4.0  HGB 13.6 12.9 13.8  HCT 45.5* 43.6 44.1  MCV 73.3* 74.3* 70.7*  PLT 281 281 230   Lipid Panel: Recent Labs    01/09/23 0454 04/25/23 0853  CHOL 159 157  HDL 38* 49*  LDLCALC 104* 92  TRIG 85 72  CHOLHDL 4.2 3.2   TSH: Recent Labs    12/06/22 1052  TSH 0.44   A1C: Lab Results  Component Value Date   HGBA1C 7.1 (H) 01/09/2023     Assessment/Plan 1. Controlled type 2 diabetes mellitus with hyperglycemia, without long-term current use of insulin  (HCC) (Primary) - recent A1c 5.9 - does not check sugars - followed by endocrinology - cont Jardiance , metformin and Ozempic - Microalbumin/Creatinine Ratio, Urine  2. Essential hypertension - uncontrolled today> did not take antihypertensives due to fasting  - cont carvedilol  and losartan   3. Mild intermittent asthma without complication - no exacerbations - cont albuterol   4. Anoxic brain injury (HCC) - followed by Dr. Arlana  5. Cerebrovascular accident (CVA), unspecified mechanism (HCC) - cont Eliquis  and high intensity statin - just changed to Crestor  due to insurance coverage - Lipid Panel  6. Elevated liver enzymes - AST/ALT  123/204 - lower abdominal cramping  - no alcohol intake - denies tylenol  use - on statin  - Complete Metabolic Panel with eGFR  7. Ingrown hair - now non tender, does not appear to have changes in size - doxycycline  (VIBRA -TABS) 100 MG tablet; Take 1 tablet (100 mg total) by mouth 2 (two) times daily for 7 days.  Dispense: 14 tablet; Refill: 0  Total time: 36 minutes. Greater than 50% of total time spent doing patient education regarding health maintenance, T2DM, HTN, CVA, abnormal labs and ingrown hair including symptom/medication management.    Next appt: Visit date not found  Andy Moye Gil BODILY  [facility] [phone]

## 2023-10-17 NOTE — Patient Instructions (Signed)
 You may need appointment sooner with me due to elevated liver enzymes> will recheck today  Doxycycline  antibiotic sent to pharmacy for breast nodule> continue heat applications a few times daily

## 2023-10-19 LAB — URINE CULTURE
MICRO NUMBER:: 16658834
SPECIMEN QUALITY:: ADEQUATE

## 2023-10-20 ENCOUNTER — Ambulatory Visit: Payer: Self-pay | Admitting: Orthopedic Surgery

## 2023-10-20 DIAGNOSIS — R748 Abnormal levels of other serum enzymes: Secondary | ICD-10-CM

## 2023-10-21 DIAGNOSIS — Z1231 Encounter for screening mammogram for malignant neoplasm of breast: Secondary | ICD-10-CM | POA: Diagnosis not present

## 2023-10-21 LAB — HM MAMMOGRAPHY

## 2023-10-22 ENCOUNTER — Ambulatory Visit: Payer: Self-pay | Admitting: Orthopedic Surgery

## 2023-10-22 ENCOUNTER — Ambulatory Visit (HOSPITAL_BASED_OUTPATIENT_CLINIC_OR_DEPARTMENT_OTHER)
Admission: RE | Admit: 2023-10-22 | Discharge: 2023-10-22 | Disposition: A | Discharge status: Home / Self Care | Source: Ambulatory Visit | Attending: Orthopedic Surgery | Admitting: Orthopedic Surgery

## 2023-10-22 DIAGNOSIS — R748 Abnormal levels of other serum enzymes: Secondary | ICD-10-CM | POA: Insufficient documentation

## 2023-10-23 ENCOUNTER — Ambulatory Visit: Payer: Self-pay | Admitting: Orthopedic Surgery

## 2023-10-24 ENCOUNTER — Encounter

## 2023-10-24 LAB — CUP PACEART REMOTE DEVICE CHECK
Battery Remaining Percentage: 75 %
Date Time Interrogation Session: 20250709225400
HighPow Impedance: 65 Ohm
Implantable Lead Connection Status: 753985
Implantable Lead Implant Date: 20230531
Implantable Lead Location: 753862
Implantable Lead Model: 3501
Implantable Lead Serial Number: 235497
Implantable Pulse Generator Implant Date: 20230531
Pulse Gen Serial Number: 177788

## 2023-10-24 NOTE — Telephone Encounter (Signed)
 Reviewed conversation per Greig Cluster 10/22/23 in epic Should hold crestor  due to elevated LFTs

## 2023-10-24 NOTE — Telephone Encounter (Signed)
 I Sent a MyChart message to the patient

## 2023-10-24 NOTE — Telephone Encounter (Signed)
 Spoke with patient this morning in regards of asking [name], [name], NP what medication to stop taking because Liver enzymes have been elevated as well, but were improved. I did advise the patient that [name] BRAVO, NP is out of the office and that I will send a message to Darlean Maus, NP  who is covering [place], [name], NP in basket.  Message sent to [name] BRAVO, NP

## 2023-10-28 ENCOUNTER — Ambulatory Visit: Payer: Self-pay | Admitting: Orthopedic Surgery

## 2023-10-28 ENCOUNTER — Ambulatory Visit: Admitting: Orthopedic Surgery

## 2023-10-28 ENCOUNTER — Telehealth: Payer: Self-pay

## 2023-10-28 ENCOUNTER — Encounter: Payer: Self-pay | Admitting: Orthopedic Surgery

## 2023-10-28 VITALS — BP 124/86 | HR 81 | Temp 97.1°F | Resp 17 | Ht 65.0 in | Wt 139.6 lb

## 2023-10-28 DIAGNOSIS — R3129 Other microscopic hematuria: Secondary | ICD-10-CM | POA: Diagnosis not present

## 2023-10-28 DIAGNOSIS — E785 Hyperlipidemia, unspecified: Secondary | ICD-10-CM

## 2023-10-28 DIAGNOSIS — R748 Abnormal levels of other serum enzymes: Secondary | ICD-10-CM

## 2023-10-28 DIAGNOSIS — E1169 Type 2 diabetes mellitus with other specified complication: Secondary | ICD-10-CM

## 2023-10-28 DIAGNOSIS — Z79899 Other long term (current) drug therapy: Secondary | ICD-10-CM

## 2023-10-28 LAB — COMPREHENSIVE METABOLIC PANEL WITH GFR
AG Ratio: 1.2 (calc) (ref 1.0–2.5)
ALT: 96 U/L — ABNORMAL HIGH (ref 6–29)
AST: 51 U/L — ABNORMAL HIGH (ref 10–35)
Albumin: 4.1 g/dL (ref 3.6–5.1)
Alkaline phosphatase (APISO): 596 U/L — ABNORMAL HIGH (ref 31–125)
BUN: 14 mg/dL (ref 7–25)
CO2: 25 mmol/L (ref 20–32)
Calcium: 9.5 mg/dL (ref 8.6–10.2)
Chloride: 108 mmol/L (ref 98–110)
Creat: 0.66 mg/dL (ref 0.50–0.99)
Globulin: 3.3 g/dL (ref 1.9–3.7)
Glucose, Bld: 75 mg/dL (ref 65–99)
Potassium: 4.2 mmol/L (ref 3.5–5.3)
Sodium: 141 mmol/L (ref 135–146)
Total Bilirubin: 0.4 mg/dL (ref 0.2–1.2)
Total Protein: 7.4 g/dL (ref 6.1–8.1)
eGFR: 109 mL/min/1.73m2 (ref >=60)

## 2023-10-28 NOTE — Patient Instructions (Signed)
 Hold rosuvastatin  x 2 weeks  DO NOT TAKE ATORVASTATIN  ANYMORE!!!!  Reschedule with [facility] for quicker appointment  IF SEVERE ABDOMINAL PAIN> REPORT TO EMERGENCY DEPARTMENT

## 2023-10-28 NOTE — Telephone Encounter (Signed)
 Patient should only be taking rosuvastatin . Atorvastatin  was discontinued due to insurance coverage. If she is confused about medications. She is welcome to schedule appointment> MUST BRING MEDICATIONS> and we will go over with her. I also made referrals due to elevated liver enzymes. Taking both cholesterol medications could cause this. Our last encounter she confirmed only taking rosuvastatin . We will plan to recheck levels 07/22 on her scheduled lab visit.

## 2023-10-28 NOTE — Telephone Encounter (Signed)
 Spoke with patient, patient states she is confused about her medications and needs to get clarity as soon as possible. Patient scheduled office visit for today at 3 pm

## 2023-10-28 NOTE — Telephone Encounter (Signed)
 Please advise if patient needs an appointment, and is video an option to discuss concern below

## 2023-10-28 NOTE — Progress Notes (Signed)
 Careteam: Patient Care Team: [name] BRAVO, NP as PCP - General (Adult Health Nurse Practitioner) [name], MD as PCP - Cardiology (Cardiology) [name], MD as PCP - Electrophysiology (Cardiology) Boneta, [name], PA-C (Family Medicine) Gordy Channing LABOR, RN as Registered Nurse Delene Thersia JINNY georgann DESIREE Care Management  Seen by: [name], AGNP-C  PLACE OF SERVICE:  [facility]  Advanced Directive information    Allergies  Allergen Reactions   Latex Hives   Other Other (See Comments), Anaphylaxis, Swelling and Hives    Hair glue causes throat to close and hives glue Hair glue causes throat to close    Codeine  Nausea And Vomiting    Was on an empty stomach.   Nitrofurantoin Rash    Chief Complaint  Patient presents with   Medical Management of Chronic Issues    Patient wants to discuss medications.      HPI: Patient is a 48 y.o. female seen today for acute visit due to medication reconciliation.   Discussed the use of AI scribe software for clinical note transcription with the patient, who gave verbal consent to proceed.  History of Present Illness   Courtney Davies is a 48 year old female who presents for medication reconciliation and evaluation of abdominal pain.  She was advised by pharmacist to schedule appointment due to polypharmacy. She has brought medications with her today.   She has been experiencing intermittent abdominal pain associated with nausea and abdominal pain since starting an injectable diabetes medication. Recent lab work revealed elevated liver enzymes and alkaline phosphatase. Recent RUQ ultrasound was negative. She does not consume alcohol or take Tylenol , but smokes marijuana occasionally. Last encounter she was advised to stop statin use. She does not know if she was taking atorvastatin  and rosuvastatin  together for awhile. She has not taken anything for a few days.   She has a history of high  cholesterol and was previously on atorvastatin , which was switched to rosuvastatin  due to insurance coverage issues. There was a concern about taking both medications simultaneously> see above.  She uses a pill organizer to manage her medications and recently refilled it on Sunday.  She has a history of a sudden heart attack and anoxic brain injury. She is aware of her family history of high cholesterol and the impact of her diabetes on her cholesterol levels. She is concerned about maintaining her SSI benefits and is unsure about the process of reapplying or maintaining her status.         Review of Systems:  Review of Systems  Constitutional:  Negative for fever.  HENT: Negative.    Respiratory: Negative.    Cardiovascular: Negative.   Gastrointestinal:  Positive for abdominal pain and nausea.  Genitourinary: Negative.   Musculoskeletal:  Negative for myalgias.  Skin: Negative.   Neurological: Negative.   Psychiatric/Behavioral: Negative.      Past Medical History:  Diagnosis Date   Anemia 09/2015   Anoxic brain injury (HCC)    Arthritis    knees (04/23/2017)   Asthma    teens; went away; came back (04/23/2017)   B12 deficiency anemia 04/23/2017   Cardiac arrest (HCC)    Chronic bronchitis (HCC)    Chronic low back pain with sciatica    Chronic lower back pain    Diabetes mellitus without complication (HCC)    Diabetes type 2, controlled (HCC)    Elevated ferritin level    Fatty liver  GERD (gastroesophageal reflux disease)    GERD with stricture    Headache    1-2/wk (04/23/2017)   History of blood transfusion plenty   related to anemia (04/23/2017)   Hypertension    Inguinal hernia    Low back pain    Migraine    1-2/month (04/23/2017)   OA (osteoarthritis) of knee--left    Paroxysmal atrial fibrillation (HCC)    Sub-clinical -seen on device?, elevated CHADSVASC   Symptomatic anemia 04/23/2017   Vitamin B 12 deficiency    Past Surgical History:   Procedure Laterality Date   ABDOMINAL HYSTERECTOMY  YRS AGO   COMPLETE   CESAREAN SECTION  1998; 2002   ESOPHAGEAL DILATION  02/2020   by Dr Marvis   INGUINAL HERNIA REPAIR Bilateral 1980s    total of 4 surgeries (04/23/2017)   IR GASTROSTOMY TUBE MOD SED  12/13/2020   KNEE ARTHROSCOPY WITH MEDIAL MENISECTOMY Left 01/30/2018   Procedure: LEFT KNEE ARTHROSCOPY WITH PARTIAL LATERAL MENISCECTOMY;  Surgeon: Vernetta Lonni GRADE, MD;  Location: [facility];  Service: Orthopedics;  Laterality: Left;   KNEE CARTILAGE SURGERY Left    SUBQ ICD IMPLANT N/A 09/13/2021   Procedure: SUBQ ICD IMPLANT;  Surgeon: [name], MD;  Location: [facility] INVASIVE CV LAB;  Service: Cardiovascular;  Laterality: N/A;   TUBAL LIGATION  2002   Social History:   reports that she has quit smoking. Her smoking use included cigarettes. She has never used smokeless tobacco. She reports that she does not currently use alcohol. She reports current drug use. Drug: Marijuana.  Family History  Problem Relation Age of Onset   Stroke Mother    Diabetes Mother    Diabetes Father    Stroke Maternal Grandmother    Diabetes Maternal Grandmother    Anemia Paternal Grandmother    Valvular heart disease Paternal Grandmother    Hypertension Mother    Prostate cancer Maternal Uncle        ? intestinal also    Medications: Patient's Medications  New Prescriptions   No medications on file  Previous Medications   ALBUTEROL  (VENTOLIN  HFA) 108 (90 BASE) MCG/ACT INHALER    Inhale 2 puffs into the lungs every 6 (six) hours as needed for wheezing or shortness of breath.   ALUM & MAG HYDROXIDE-SIMETH (MAALOX MAX) 400-400-40 MG/5ML SUSPENSION    Take 5 mLs by mouth every 6 (six) hours as needed for indigestion.   BD PEN NEEDLE NANO 2ND GEN 32G X 4 MM MISC    3 (three) times daily. as directed   CARVEDILOL  (COREG ) 6.25 MG TABLET    Take 1 tablet (6.25 mg total) by mouth 2 (two) times daily.   ELIQUIS  5 MG TABS TABLET    TAKE 1  TABLET(5 MG) BY MOUTH TWICE DAILY   GABAPENTIN  (NEURONTIN ) 100 MG CAPSULE    Take 2 capsules (200 mg total) by mouth 2 (two) times daily.   GABAPENTIN  (NEURONTIN ) 100 MG CAPSULE    Take 200 mg by mouth at bedtime.   HYDROXYZINE  (ATARAX ) 10 MG TABLET    Take 1 tablet (10 mg total) by mouth at bedtime.   JARDIANCE  10 MG TABS TABLET    TAKE 1 TABLET(10 MG) BY MOUTH EVERY MORNING   LAMOTRIGINE  (LAMICTAL ) 25 MG TABLET    Take 1 tablet (25 mg total) by mouth 2 (two) times daily.   LOSARTAN  (COZAAR ) 25 MG TABLET    TAKE 1 TABLET BY MOUTH DAILY   METFORMIN (FORTAMET) 500 MG (  OSM) 24 HR TABLET    Take 500 mg by mouth in the morning and at bedtime.   OZEMPIC, 0.25 OR 0.5 MG/DOSE, 2 MG/3ML SOPN    Inject 0.5 mg into the skin once a week.   QUETIAPINE  (SEROQUEL ) 100 MG TABLET    TAKE 1 TABLET(100 MG) BY MOUTH AT BEDTIME   RIVASTIGMINE  (EXELON ) 3 MG CAPSULE    TAKE 1 CAPSULE(3 MG) BY MOUTH TWICE DAILY   ROSUVASTATIN  (CRESTOR ) 20 MG TABLET    Take 1.5 tablets (30 mg total) by mouth daily.   SERTRALINE  (ZOLOFT ) 100 MG TABLET    TAKE 1 TABLET(100 MG) BY MOUTH DAILY  Modified Medications   No medications on file  Discontinued Medications   No medications on file    Physical Exam:  Vitals:   10/28/23 1502  BP: 124/86  Pulse: 81  Resp: 17  Temp: (!) 97.1 F (36.2 C)  SpO2: 99%  Weight: 139 lb 9.6 oz (63.3 kg)  Height: 5' 5 (1.651 m)   Body mass index is 23.23 kg/m. Wt Readings from Last 3 Encounters:  10/28/23 139 lb 9.6 oz (63.3 kg)  10/17/23 194 lb (88 kg)  10/10/23 197 lb (89.4 kg)    Physical Exam Vitals reviewed.  Constitutional:      General: She is not in acute distress. HENT:     Head: Normocephalic.  Eyes:     General:        Right eye: No discharge.        Left eye: No discharge.  Cardiovascular:     Rate and Rhythm: Normal rate and regular rhythm.     Pulses: Normal pulses.     Heart sounds: Normal heart sounds.  Pulmonary:     Effort: Pulmonary effort is normal.      Breath sounds: Normal breath sounds.  Abdominal:     General: Bowel sounds are normal.     Palpations: Abdomen is soft.  Musculoskeletal:        General: Normal range of motion.     Cervical back: Neck supple.  Skin:    General: Skin is warm.     Capillary Refill: Capillary refill takes less than 2 seconds.  Neurological:     General: No focal deficit present.     Mental Status: She is alert and oriented to person, place, and time.  Psychiatric:        Mood and Affect: Mood normal.     Labs reviewed: Basic Metabolic Panel: Recent Labs    12/06/22 1052 01/08/23 1117 06/13/23 1552 09/27/23 2123 10/17/23 0907  NA 136   < > 138 139 139  K 4.1   < > 4.1 3.7 4.3  CL 104   < > 106 104 106  CO2 21   < > 26 21* 24  GLUCOSE 127   < > 149* 74 86  BUN 11   < > 12 14 14   CREATININE 0.91   < > 0.74 0.77 0.66  CALCIUM  9.1   < > 9.4 9.4 9.4  TSH 0.44  --   --   --   --    < > = values in this interval not displayed.   Liver Function Tests: Recent Labs    01/08/23 1117 06/13/23 1552 09/27/23 2123 10/17/23 0907  AST 22 14 123* 48*  ALT 19 18 204* 115*  ALKPHOS 97  --  319*  --   BILITOT 0.4 0.3 0.5 0.5  PROT 7.0 7.1  8.1 7.4  ALBUMIN 3.6  --  4.5  --    Recent Labs    03/05/23 1415  LIPASE 29   No results for input(s): AMMONIA in the last 8760 hours. CBC: Recent Labs    06/13/23 1552 07/12/23 1618 09/27/23 2123  WBC 8.1 7.1 8.1  NEUTROABS 4,082 3,110 4.0  HGB 13.6 12.9 13.8  HCT 45.5* 43.6 44.1  MCV 73.3* 74.3* 70.7*  PLT 281 281 230   Lipid Panel: Recent Labs    01/09/23 0454 04/25/23 0853 10/17/23 0907  CHOL 159 157 122  HDL 38* 49* 53  LDLCALC 104* 92 55  TRIG 85 72 62  CHOLHDL 4.2 3.2 2.3   TSH: Recent Labs    12/06/22 1052  TSH 0.44   A1C: Lab Results  Component Value Date   HGBA1C 7.1 (H) 01/09/2023     Assessment/Plan 1. Elevated alkaline phosphatase level (Primary) - alkaline phos 516 (07/03) - RUQ ultrasound negative -  advised to stop statin - continues to have intermittent nausea and abdominal pain> worsened with Ozempic increase  - h/o anoxic brain injury> pharmacy concerned she was taking atorvastatin  and rosuvastatin  together  - stopped statin use a few days ago - scheduled to see [facility] 07/17  - Complete Metabolic Panel with eGFR  2. Medication course changed - medication reconciliation done with patient   3. Hyperlipidemia associated with type 2 diabetes mellitus (HCC) - Total 122, LDL 55 10/17/2023 - see above - atorvastatin  discontinued due to insurance coverage - advised to hold rosuvastatin  due to elevated LFT and alk phos  - see above - avoid processed, dairy and fried foods  - if unable to take statin in future> consider Repatha injections- would consult with cardiology first  Total time: 33 minutes. Greater than 50% of total time spent doing patient education regarding medication reconciliation, elevated liver/alk phos including symptom/medication management.     Next appt: Visit date not found  Keilani Terrance Gil BODILY  [facility] [phone]

## 2023-10-28 NOTE — Telephone Encounter (Signed)
 Copied from CRM [id number]. Topic: Clinical - Prescription Issue >> Oct 28, 2023  9:48 AM Marda MATSU wrote: Reason for CRM:  Elenor with [facility] would like provider Gil to be aware of some concerns observed when patient came into the pharmacy   1. Patient is taking two cholesterol meds : rosuvastatin  (CRESTOR ) 20 MG tablet and atorvastatin     2. Patient is not taking her medications properly, she brought in a large of medications with expiration dates of March. Elenor with [facility] would like to recommend someone assist her with medication management , mother is not really able to assist her with management of her meds.   Please call me if you have anymore questions

## 2023-10-29 ENCOUNTER — Ambulatory Visit: Payer: Self-pay | Admitting: Orthopedic Surgery

## 2023-10-30 ENCOUNTER — Ambulatory Visit: Payer: Self-pay | Admitting: Cardiology

## 2023-10-31 ENCOUNTER — Ambulatory Visit (HOSPITAL_COMMUNITY): Admitting: Psychiatry

## 2023-10-31 DIAGNOSIS — K760 Fatty (change of) liver, not elsewhere classified: Secondary | ICD-10-CM | POA: Diagnosis not present

## 2023-10-31 DIAGNOSIS — R7401 Elevation of levels of liver transaminase levels: Secondary | ICD-10-CM | POA: Diagnosis not present

## 2023-10-31 DIAGNOSIS — R748 Abnormal levels of other serum enzymes: Secondary | ICD-10-CM | POA: Diagnosis not present

## 2023-11-01 DIAGNOSIS — R7401 Elevation of levels of liver transaminase levels: Secondary | ICD-10-CM | POA: Diagnosis not present

## 2023-11-01 DIAGNOSIS — K760 Fatty (change of) liver, not elsewhere classified: Secondary | ICD-10-CM | POA: Diagnosis not present

## 2023-11-05 ENCOUNTER — Other Ambulatory Visit

## 2023-11-05 DIAGNOSIS — R748 Abnormal levels of other serum enzymes: Secondary | ICD-10-CM | POA: Diagnosis not present

## 2023-11-05 DIAGNOSIS — E1165 Type 2 diabetes mellitus with hyperglycemia: Secondary | ICD-10-CM | POA: Diagnosis not present

## 2023-11-05 DIAGNOSIS — I639 Cerebral infarction, unspecified: Secondary | ICD-10-CM | POA: Diagnosis not present

## 2023-11-05 LAB — COMPLETE METABOLIC PANEL WITHOUT GFR
AG Ratio: 1.3 (calc) (ref 1.0–2.5)
ALT: 55 U/L — ABNORMAL HIGH (ref 6–29)
AST: 33 U/L (ref 10–35)
Albumin: 3.8 g/dL (ref 3.6–5.1)
Alkaline phosphatase (APISO): 488 U/L — ABNORMAL HIGH (ref 31–125)
BUN: 18 mg/dL (ref 7–25)
CO2: 23 mmol/L (ref 20–32)
Calcium: 9 mg/dL (ref 8.6–10.2)
Chloride: 107 mmol/L (ref 98–110)
Creat: 0.64 mg/dL (ref 0.50–0.99)
Globulin: 3 g/dL (ref 1.9–3.7)
Glucose, Bld: 84 mg/dL (ref 65–99)
Potassium: 4.1 mmol/L (ref 3.5–5.3)
Sodium: 139 mmol/L (ref 135–146)
Total Bilirubin: 0.4 mg/dL (ref 0.2–1.2)
Total Protein: 6.8 g/dL (ref 6.1–8.1)

## 2023-11-05 LAB — LIPID PANEL
Cholesterol: 127 mg/dL (ref <200)
HDL: 49 mg/dL — ABNORMAL LOW (ref >=50)
LDL Cholesterol (Calc): 63 mg/dL
Non-HDL Cholesterol (Calc): 78 mg/dL (ref <130)
Total CHOL/HDL Ratio: 2.6 (calc) (ref <5.0)
Triglycerides: 66 mg/dL (ref <150)

## 2023-11-05 NOTE — Addendum Note (Signed)
 Addended by: ARMOND ROLIN BRAVO on: 11/05/2023 08:54 AM   Modules accepted: Orders

## 2023-11-05 NOTE — Addendum Note (Signed)
 Addended by: ARMOND ROLIN BRAVO on: 11/05/2023 09:25 AM   Modules accepted: Orders

## 2023-11-06 LAB — MICROALBUMIN / CREATININE URINE RATIO
Creatinine, Urine: 235 mg/dL (ref 20–275)
Microalb Creat Ratio: 6 mg/g{creat} (ref <30)
Microalb, Ur: 1.4 mg/dL

## 2023-11-07 DIAGNOSIS — R748 Abnormal levels of other serum enzymes: Secondary | ICD-10-CM | POA: Diagnosis not present

## 2023-11-11 ENCOUNTER — Other Ambulatory Visit: Payer: Self-pay | Admitting: Physical Medicine & Rehabilitation

## 2023-11-11 DIAGNOSIS — G931 Anoxic brain damage, not elsewhere classified: Secondary | ICD-10-CM

## 2023-11-14 ENCOUNTER — Other Ambulatory Visit: Payer: Self-pay | Admitting: Orthopedic Surgery

## 2023-11-14 DIAGNOSIS — I10 Essential (primary) hypertension: Secondary | ICD-10-CM

## 2023-11-17 ENCOUNTER — Other Ambulatory Visit (HOSPITAL_COMMUNITY): Payer: Self-pay | Admitting: Psychiatry

## 2023-11-17 ENCOUNTER — Other Ambulatory Visit: Payer: Self-pay | Admitting: Orthopedic Surgery

## 2023-11-17 DIAGNOSIS — F5101 Primary insomnia: Secondary | ICD-10-CM

## 2023-11-17 DIAGNOSIS — F329 Major depressive disorder, single episode, unspecified: Secondary | ICD-10-CM

## 2023-11-18 DIAGNOSIS — R1030 Lower abdominal pain, unspecified: Secondary | ICD-10-CM | POA: Diagnosis not present

## 2023-11-18 DIAGNOSIS — R1013 Epigastric pain: Secondary | ICD-10-CM | POA: Diagnosis not present

## 2023-11-18 DIAGNOSIS — K219 Gastro-esophageal reflux disease without esophagitis: Secondary | ICD-10-CM | POA: Diagnosis not present

## 2023-11-18 DIAGNOSIS — R748 Abnormal levels of other serum enzymes: Secondary | ICD-10-CM | POA: Diagnosis not present

## 2023-11-19 ENCOUNTER — Other Ambulatory Visit (HOSPITAL_COMMUNITY): Payer: Self-pay

## 2023-11-19 DIAGNOSIS — F329 Major depressive disorder, single episode, unspecified: Secondary | ICD-10-CM

## 2023-11-19 MED ORDER — SERTRALINE HCL 100 MG PO TABS: 100.0000 mg | ORAL_TABLET | Freq: Every day | ORAL | 0 refills | Status: DC

## 2023-11-22 ENCOUNTER — Telehealth: Payer: Self-pay

## 2023-11-22 NOTE — Telephone Encounter (Signed)
 Copied from CRM [id number]. Topic: Clinical - Medical Advice >> Nov 22, 2023 11:18 AM Merlynn LABOR wrote: Reason for CRM: Patient called in requesting call back to get information about neurologist doctor. Please contact patient to provide information. Patient can be reached at [phone].

## 2023-11-22 NOTE — Telephone Encounter (Signed)
 Mychart message sent to patient.

## 2023-11-27 ENCOUNTER — Ambulatory Visit: Payer: Self-pay

## 2023-11-27 NOTE — Telephone Encounter (Signed)
 Thank you :)

## 2023-11-27 NOTE — Telephone Encounter (Signed)
 Message routed to PCP [name] BRAVO, NP . Patient has appointment scheduled with you tomorrow 11/28/2023

## 2023-11-27 NOTE — Telephone Encounter (Signed)
 FYI Only or Action Required?: FYI only for provider.  Patient was last seen in primary care on 10/28/2023 by [name] BRAVO, NP.  Called Nurse Triage reporting Chills and Nasal Congestion.  Symptoms began about a month ago.  Interventions attempted: Other: she states she bundles up and wears extra layers; blowing her nose.  Symptoms are: runny nose with clear discharge; feeling cold/chills constantlyunchanged.  Triage Disposition: Home Care  Patient/caregiver understands and will follow disposition?: Yes              Copied from CRM #[id number]. Topic: Clinical - Red Word Triage >> Nov 27, 2023  1:47 PM Miquel SAILOR wrote: Red Word that prompted transfer to Nurse Triage: Feeling Cold due to medication for 2 months 24/7. Has app 08/14 for AWV Reason for Disposition  Common cold with no complications  Answer Assessment - Initial Assessment Questions Patient also states she does not know why she is on Eliquis  or how long she will be on it. She states she was told she did not have a stroke. Advised patient to address concerns/questions with PCP.    1. ONSET: When did the nasal discharge start?      X 1 month.  2. AMOUNT: How much discharge is there?      She states every day, all day. Clear mucus.  3. COUGH: Do you have a cough? If Yes, ask: Describe the color of your mucus. (e.g., clear, white, yellow, green)     No.  4. RESPIRATORY DISTRESS: Describe your breathing.      No difficulty breathing.  5. FEVER: Do you have a fever? If Yes, ask: What is your temperature, how was it measured, and when did it start?     No.  6. SEVERITY: Overall, how bad are you feeling right now? (e.g., doesn't interfere with normal activities, staying home from school/work, staying in bed)      Patient states she feels cold even if she is fully dressed. She is able to do her normal activities. She states she stays so cold all the time.  7. OTHER SYMPTOMS: Do you have any other  symptoms? (e.g., earache, mouth sores, sore throat, wheezing)     Chills, states she feels cold all the time (she is asking if that could be related to her blood thinner). Patient denies chest pain, SOB, bruising/bleeding, sore throat, earaches.  8. PREGNANCY: Is there any chance you are pregnant? When was your last menstrual period?     LMP: she states it has been gone years ago.  Protocols used: Common Cold-A-AH

## 2023-11-27 NOTE — Telephone Encounter (Signed)
 Recommend changing visit to acute. We can reschedule AWV.

## 2023-11-27 NOTE — Telephone Encounter (Signed)
 Ok, I will forward to Admin staff to change appointment.

## 2023-11-27 NOTE — Telephone Encounter (Signed)
 McGill, Alondra to Me (Selected Message) AM    11/27/23  4:19 PM Done

## 2023-11-28 ENCOUNTER — Other Ambulatory Visit: Payer: Self-pay

## 2023-11-28 ENCOUNTER — Encounter (HOSPITAL_COMMUNITY): Payer: Self-pay | Admitting: Psychiatry

## 2023-11-28 ENCOUNTER — Ambulatory Visit (HOSPITAL_COMMUNITY): Admitting: Psychiatry

## 2023-11-28 ENCOUNTER — Encounter: Payer: Self-pay | Admitting: Orthopedic Surgery

## 2023-11-28 ENCOUNTER — Ambulatory Visit (INDEPENDENT_AMBULATORY_CARE_PROVIDER_SITE_OTHER): Admitting: Orthopedic Surgery

## 2023-11-28 ENCOUNTER — Ambulatory Visit (HOSPITAL_BASED_OUTPATIENT_CLINIC_OR_DEPARTMENT_OTHER): Admitting: Psychiatry

## 2023-11-28 VITALS — BP 133/90 | HR 80 | Ht 66.0 in | Wt 188.0 lb

## 2023-11-28 VITALS — BP 98/82 | HR 83 | Temp 96.7°F | Resp 16 | Ht 65.0 in | Wt 188.8 lb

## 2023-11-28 DIAGNOSIS — R413 Other amnesia: Secondary | ICD-10-CM | POA: Diagnosis not present

## 2023-11-28 DIAGNOSIS — Z0000 Encounter for general adult medical examination without abnormal findings: Secondary | ICD-10-CM | POA: Diagnosis not present

## 2023-11-28 DIAGNOSIS — R4189 Other symptoms and signs involving cognitive functions and awareness: Secondary | ICD-10-CM | POA: Diagnosis not present

## 2023-11-28 DIAGNOSIS — F1290 Cannabis use, unspecified, uncomplicated: Secondary | ICD-10-CM

## 2023-11-28 DIAGNOSIS — R6889 Other general symptoms and signs: Secondary | ICD-10-CM | POA: Diagnosis not present

## 2023-11-28 DIAGNOSIS — F321 Major depressive disorder, single episode, moderate: Secondary | ICD-10-CM

## 2023-11-28 DIAGNOSIS — R29818 Other symptoms and signs involving the nervous system: Secondary | ICD-10-CM

## 2023-11-28 DIAGNOSIS — Z01419 Encounter for gynecological examination (general) (routine) without abnormal findings: Secondary | ICD-10-CM | POA: Diagnosis not present

## 2023-11-28 DIAGNOSIS — R0981 Nasal congestion: Secondary | ICD-10-CM

## 2023-11-28 LAB — POCT INFLUENZA A/B
Influenza A, POC: NEGATIVE
Influenza B, POC: NEGATIVE

## 2023-11-28 LAB — POC COVID19 BINAXNOW: SARS Coronavirus 2 Ag: NEGATIVE

## 2023-11-28 MED ORDER — SERTRALINE HCL 100 MG PO TABS: 100.0000 mg | ORAL_TABLET | Freq: Every day | ORAL | 0 refills | Status: DC

## 2023-11-28 MED ORDER — CETIRIZINE HCL 10 MG PO TABS: 10.0000 mg | ORAL_TABLET | Freq: Every day | ORAL | 11 refills | Status: DC

## 2023-11-28 MED ORDER — LAMOTRIGINE 100 MG PO TABS
ORAL_TABLET | ORAL | 1 refills | Status: DC
Start: 2023-11-28 — End: 2024-01-08

## 2023-11-28 NOTE — Progress Notes (Signed)
 Careteam: Patient Care Team: [name] BRAVO, NP as PCP - General (Adult Health Nurse Practitioner) [name], MD as PCP - Cardiology (Cardiology) [name], MD as PCP - Electrophysiology (Cardiology) Boneta, [name], PA-C (Family Medicine) Gordy Channing LABOR, RN as Registered Nurse Delene Thersia JINNY georgann DESIREE Care Management  Seen by: [name], AGNP-C  PLACE OF SERVICE:  [facility]  Advanced Directive information    Allergies  Allergen Reactions   Latex Hives   Other Other (See Comments), Anaphylaxis, Swelling and Hives    Hair glue causes throat to close and hives glue Hair glue causes throat to close    Codeine  Nausea And Vomiting    Was on an empty stomach.   Nitrofurantoin Rash    Chief Complaint  Patient presents with   Cough    Patient complains of cough, nasal congestion, and chills,      HPI: Patient is a 48 y.o. female seen today for acute visit due to nasal congestion.   Discussed the use of AI scribe software for clinical note transcription with the patient, who gave verbal consent to proceed.  History of Present Illness   Courtney Davies is a 48 year old female who presents with symptoms of nasal congestion and feeling cold.  She experiences nasal congestion, sneezing, and a runny nose with clear discharge. Symptoms began a few days ago, No sore throat is present. She has not tried any OTC remedies.   She describes feeling extremely cold, particularly in certain spots, and is unsure of the cause of this sensation.  In house covid/flu negative.      Review of Systems:  Review of Systems  Constitutional:  Negative for fever and malaise/fatigue.  HENT:  Positive for congestion. Negative for ear pain, sinus pain and sore throat.   Respiratory:  Negative for cough.   Cardiovascular: Negative.   Gastrointestinal:  Negative for nausea and vomiting.  Musculoskeletal:  Negative for myalgias.  Neurological:   Negative for dizziness and headaches.  Psychiatric/Behavioral:  Positive for depression and memory loss.     Past Medical History:  Diagnosis Date   Anemia 09/2015   Anoxic brain injury (HCC)    Arthritis    knees (04/23/2017)   Asthma    teens; went away; came back (04/23/2017)   B12 deficiency anemia 04/23/2017   Cardiac arrest (HCC)    Chronic bronchitis (HCC)    Chronic low back pain with sciatica    Chronic lower back pain    Diabetes mellitus without complication (HCC)    Diabetes type 2, controlled (HCC)    Elevated ferritin level    Fatty liver    GERD (gastroesophageal reflux disease)    GERD with stricture    Headache    1-2/wk (04/23/2017)   History of blood transfusion plenty   related to anemia (04/23/2017)   Hypertension    Inguinal hernia    Low back pain    Migraine    1-2/month (04/23/2017)   OA (osteoarthritis) of knee--left    Paroxysmal atrial fibrillation (HCC)    Sub-clinical -seen on device?, elevated CHADSVASC   Symptomatic anemia 04/23/2017   Vitamin B 12 deficiency    Past Surgical History:  Procedure Laterality Date   ABDOMINAL HYSTERECTOMY  YRS AGO   COMPLETE   CESAREAN SECTION  1998; 2002   ESOPHAGEAL DILATION  02/2020   by Dr Marvis   INGUINAL HERNIA REPAIR Bilateral [phone]  total of 4 surgeries (04/23/2017)   IR GASTROSTOMY TUBE MOD SED  12/13/2020   KNEE ARTHROSCOPY WITH MEDIAL MENISECTOMY Left 01/30/2018   Procedure: LEFT KNEE ARTHROSCOPY WITH PARTIAL LATERAL MENISCECTOMY;  Surgeon: Vernetta Lonni GRADE, MD;  Location: [facility];  Service: Orthopedics;  Laterality: Left;   KNEE CARTILAGE SURGERY Left    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 Her smoking use included cigarettes. She has never used smokeless tobacco.  She reports that she does not currently use alcohol. She reports current drug use. Drug: Marijuana.  Family History  Problem Relation Age of Onset   Stroke Mother    Diabetes Mother    Diabetes Father    Stroke Maternal Grandmother    Diabetes Maternal Grandmother    Anemia Paternal Grandmother    Valvular heart disease Paternal Grandmother    Hypertension Mother    Prostate cancer Maternal Uncle        ? intestinal also    Medications: Patient's Medications  New Prescriptions   CETIRIZINE  (ZYRTEC ) 10 MG TABLET    Take 1 tablet (10 mg total) by mouth daily.  Previous Medications   ALBUTEROL  (VENTOLIN  HFA) 108 (90 BASE) MCG/ACT INHALER    Inhale 2 puffs into the lungs every 6 (six) hours as needed for wheezing or shortness of breath.   ALUM & MAG HYDROXIDE-SIMETH (MAALOX MAX) 400-400-40 MG/5ML SUSPENSION    Take 5 mLs by mouth every 6 (six) hours as needed for indigestion.   BD PEN NEEDLE NANO 2ND GEN 32G X 4 MM MISC    3 (three) times daily. as directed   CARVEDILOL  (COREG ) 6.25 MG TABLET    TAKE 1 TABLET(6.25 MG) BY MOUTH TWICE DAILY   ELIQUIS  5 MG TABS TABLET    TAKE 1 TABLET(5 MG) BY MOUTH TWICE DAILY   GABAPENTIN  (NEURONTIN ) 100 MG CAPSULE    Take 200 mg by mouth at bedtime.   HYDROXYZINE  (ATARAX ) 10 MG TABLET    TAKE 1 TABLET(10 MG) BY MOUTH AT BEDTIME   JARDIANCE  10 MG TABS TABLET    TAKE 1 TABLET(10 MG) BY MOUTH EVERY MORNING   LOSARTAN  (COZAAR ) 25 MG TABLET    TAKE 1 TABLET BY MOUTH DAILY   METFORMIN (FORTAMET) 500 MG (OSM) 24 HR TABLET    Take 500 mg by mouth in the morning and at bedtime.   OZEMPIC, 0.25 OR 0.5 MG/DOSE, 2 MG/3ML SOPN    Inject 0.5 mg into the skin once a week.   QUETIAPINE  (SEROQUEL ) 100 MG TABLET    TAKE 1 TABLET(100 MG) BY MOUTH AT BEDTIME   RIVASTIGMINE  (EXELON ) 3 MG CAPSULE    TAKE 1 CAPSULE(3 MG) BY MOUTH TWICE DAILY   ROSUVASTATIN  (CRESTOR ) 20 MG TABLET    Take 1.5 tablets (30 mg total) by mouth daily.  Modified Medications   Modified Medication  Previous Medication   LAMOTRIGINE  (LAMICTAL ) 100 MG TABLET lamoTRIgine  (LAMICTAL ) 25 MG tablet      Take 1/2 tab twice a day    Take 1 tablet (25 mg total) by mouth 2 (two) times daily.   SERTRALINE  (ZOLOFT ) 100 MG TABLET sertraline  (ZOLOFT ) 100 MG tablet      Take 1 tablet (100 mg total) by mouth daily.    Take 1  tablet (100 mg total) by mouth daily.  Discontinued Medications   No medications on file    Physical Exam:  Vitals:   11/28/23 1043  BP: 98/82  Pulse: 83  Resp: 16  Temp: (!) 96.7 F (35.9 C)  SpO2: 96%  Weight: 188 lb 12.8 oz (85.6 kg)  Height: 5' 5 (1.651 m)   Body mass index is 31.42 kg/m. Wt Readings from Last 3 Encounters:  11/28/23 188 lb 12.8 oz (85.6 kg)  10/28/23 139 lb 9.6 oz (63.3 kg)  10/17/23 194 lb (88 kg)    Physical Exam Vitals reviewed.  Constitutional:      General: She is not in acute distress. HENT:     Head: Normocephalic.     Right Ear: There is no impacted cerumen.     Left Ear: There is no impacted cerumen.     Nose:     Right Turbinates: Enlarged and swollen.     Left Turbinates: Enlarged and swollen.     Right Sinus: No maxillary sinus tenderness or frontal sinus tenderness.     Left Sinus: No maxillary sinus tenderness.     Mouth/Throat:     Mouth: Mucous membranes are moist.     Pharynx: No posterior oropharyngeal erythema.  Eyes:     General:        Right eye: No discharge.        Left eye: No discharge.  Cardiovascular:     Rate and Rhythm: Normal rate and regular rhythm.     Pulses: Normal pulses.     Heart sounds: Normal heart sounds.  Pulmonary:     Effort: Pulmonary effort is normal.     Breath sounds: Normal breath sounds.  Abdominal:     Palpations: Abdomen is soft.  Musculoskeletal:        General: Normal range of motion.     Cervical back: Neck supple.  Lymphadenopathy:     Cervical: No cervical adenopathy.  Skin:    General: Skin is warm.     Capillary Refill: Capillary refill takes less than 2  seconds.  Neurological:     General: No focal deficit present.     Mental Status: She is alert and oriented to person, place, and time.     Labs reviewed: Basic Metabolic Panel: Recent Labs    12/06/22 1052 01/08/23 1117 10/17/23 0907 10/28/23 1552 11/05/23 0925  NA 136   < > 139 141 139  K 4.1   < > 4.3 4.2 4.1  CL 104   < > 106 108 107  CO2 21   < > 24 25 23   GLUCOSE 127   < > 86 75 84  BUN 11   < > 14 14 18   CREATININE 0.91   < > 0.66 0.66 0.64  CALCIUM  9.1   < > 9.4 9.5 9.0  TSH 0.44  --   --   --   --    < > = values in this interval not displayed.   Liver Function Tests: Recent Labs    01/08/23 1117 06/13/23 1552 09/27/23 2123 10/17/23 0907 10/28/23 1552 11/05/23 0925  AST 22   < > 123* 48* 51* 33  ALT 19   < > 204* 115* 96* 55*  ALKPHOS 97  --  319*  --   --   --   BILITOT 0.4   < > 0.5 0.5 0.4 0.4  PROT 7.0   < > 8.1 7.4 7.4 6.8  ALBUMIN 3.6  --  4.5  --   --   --    < > = values in this interval not displayed.   Recent Labs    03/05/23 1415  LIPASE 29   No results for input(s): AMMONIA in the last 8760 hours. CBC: Recent Labs    06/13/23 1552 07/12/23 1618 09/27/23 2123  WBC 8.1 7.1 8.1  NEUTROABS 4,082 3,110 4.0  HGB 13.6 12.9 13.8  HCT 45.5* 43.6 44.1  MCV 73.3* 74.3* 70.7*  PLT 281 281 230   Lipid Panel: Recent Labs    04/25/23 0853 10/17/23 0907 11/05/23 0925  CHOL 157 122 127  HDL 49* 53 49*  LDLCALC 92 55 63  TRIG 72 62 66  CHOLHDL 3.2 2.3 2.6   TSH: Recent Labs    12/06/22 1052  TSH 0.44   A1C: Lab Results  Component Value Date   HGBA1C 7.1 (H) 01/09/2023     Assessment/Plan 1. Nasal congestion (Primary) - onset 2-3 days ago - nasal turbinates swollen, no facial pain, clear nasal drainage - recommend antihistamine to dry out secretions  - POC COVID-19> negative - POC Influenza A/B> negative - cetirizine  (ZYRTEC ) 10 MG tablet; Take 1 tablet (10 mg total) by mouth daily.  Dispense: 30 tablet; Refill:  11  2. Sensation of feeling cold - see above - exam unremarkable - if symptoms continue, consider TSH   Total time: 15 minutes. Greater than 50% of total time spent doing patient education regarding nasal congestion and cold sensation including symptom/medication management.    Next appt: Visit date not found  Courtney Davies Gil BODILY  [facility] [phone]

## 2023-11-28 NOTE — Progress Notes (Signed)
 [facility] MD/PA/NP OP Progress Note  11/28/2023 11:50 AM Courtney Davies  MRN:  [id number]  Chief Complaint:  Chief Complaint  Patient presents with   Anxiety   Medication Refill   Follow-up   Agitation   HPI: Patient is 48 year old African-American female who was seen first time 6 weeks ago came today to the office for her follow-up appointment.  Patient appears very positional and irritable.  She came 15 minutes late and not happy that she was not able to check in on time.  She reported taking the medication but again does not remember the details.  She told last Friday she was fired from her work because having problem with her coworkers.  Patient was working as a Stage manager at [employer].  Patient told today she has appointment at 2 PM for the job interview to work in [employer] and she is looking forward to.  She continues to struggle with memory issues.  She forgetful and most of the time her medicine is organized by her mother.  She is not sure about the current dosage.  She noticed the medicine given last time initially helped but now it is not working anymore.  She reported her anger is so severe that 0-100 and few seconds.  She admitted getting irritable and sometime agitation for no reason.  She told the new medicine helps not to have crazy thoughts but she like to get her mood stable.  As per EMR she is taking Zoloft  and Seroquel .  She has diabetes and she believe her sugar is under control.  She reported paranoia and sometimes scared to go outside because she is not comfortable around people.  She continued to smoke marijuana.  She also have numbness, tingling, occasionally headaches.  We talked about stopping the cannabis use but patient reported that is the only thing keep her calm.  She admitted some assistance required for her ADLs.  Her son helps finances and mother helps medication box.  She reported lack of desire to do things.  We have recommended to see a therapist but again  patient do not remember and did not make the appointments.  Recently had blood work and liver enzymes still high but better than before.  Visit Diagnosis:    ICD-10-CM   1. MDD (major depressive disorder), single episode, moderate (HCC)  F32.1 lamoTRIgine  (LAMICTAL ) 100 MG tablet    sertraline  (ZOLOFT ) 100 MG tablet    2. Cannabis use disorder  F12.90 lamoTRIgine  (LAMICTAL ) 100 MG tablet    3. Neurocognitive deficits  R29.818 lamoTRIgine  (LAMICTAL ) 100 MG tablet   R41.89     4. Memory impairment  R41.3 lamoTRIgine  (LAMICTAL ) 100 MG tablet      Past Psychiatric History: Reviewed  Past Medical History:  Past Medical History:  Diagnosis Date   Anemia 09/2015   Anoxic brain injury (HCC)    Arthritis    knees (04/23/2017)   Asthma    teens; went away; came back (04/23/2017)   B12 deficiency anemia 04/23/2017   Cardiac arrest (HCC)    Chronic bronchitis (HCC)    Chronic low back pain with sciatica    Chronic lower back pain    Diabetes mellitus without complication (HCC)    Diabetes type 2, controlled (HCC)    Elevated ferritin level    Fatty liver    GERD (gastroesophageal reflux disease)    GERD with stricture    Headache    1-2/wk (04/23/2017)   History of blood transfusion  plenty   related to anemia (04/23/2017)   Hypertension    Inguinal hernia    Low back pain    Migraine    1-2/month (04/23/2017)   OA (osteoarthritis) of knee--left    Paroxysmal atrial fibrillation (HCC)    Sub-clinical -seen on device?, elevated CHADSVASC   Symptomatic anemia 04/23/2017   Vitamin B 12 deficiency     Past Surgical History:  Procedure Laterality Date   ABDOMINAL HYSTERECTOMY  YRS AGO   COMPLETE   CESAREAN SECTION  1998; 2002   ESOPHAGEAL DILATION  02/2020   by Dr Marvis   INGUINAL HERNIA REPAIR Bilateral 1980s    total of 4 surgeries (04/23/2017)   IR GASTROSTOMY TUBE MOD SED  12/13/2020   KNEE ARTHROSCOPY WITH MEDIAL MENISECTOMY Left 01/30/2018   Procedure: LEFT  KNEE ARTHROSCOPY WITH PARTIAL LATERAL MENISCECTOMY;  Surgeon: Vernetta Lonni GRADE, MD;  Location: [facility];  Service: Orthopedics;  Laterality: Left;   KNEE CARTILAGE SURGERY Left    SUBQ ICD IMPLANT N/A 09/13/2021   Procedure: SUBQ ICD IMPLANT;  Surgeon: [name], MD;  Location: [facility] INVASIVE CV LAB;  Service: Cardiovascular;  Laterality: N/A;   TUBAL LIGATION  2002    Family Psychiatric History: No history of psychiatric inpatient.  History of cutting wrist in 2022.  History of cardiac arrest resulting in neurocognitive impairment, impaired memory and emotional dysregulation.  As per EMR took Celexa , Depakote  in 2022 during hospitalization on the medical floor.  Saw once Dr Bronson and given Seroquel  and Zoloft .  Family History:  Family History  Problem Relation Age of Onset   Stroke Mother    Diabetes Mother    Diabetes Father    Stroke Maternal Grandmother    Diabetes Maternal Grandmother    Anemia Paternal Grandmother    Valvular heart disease Paternal Grandmother    Hypertension Mother    Prostate cancer Maternal Uncle        ? intestinal also    Social History:  Social History   Socioeconomic History   Marital status: Single    Spouse name: Not on file   Number of children: 2   Years of education: 11th   Highest education level: Not on file  Occupational History   Occupation: Scientist, product/process development in restaurant  Tobacco Use   Smoking status: Former    Types: Cigarettes   Smokeless tobacco: Never  Vaping Use   Vaping status: Never Used  Substance and Sexual Activity   Alcohol use: Not Currently    Comment: occ   Drug use: Yes    Types: Marijuana    Comment: daily   Sexual activity: Not Currently    Birth control/protection: Surgical  Other Topics Concern   Not on file  Social History Narrative   ** Merged History Encounter **       Lives at home with her two children. Occasional caffeine use. Right-handed.   Social Drivers of Research scientist (physical sciences) Strain: High Risk (08/02/2023)   Overall Financial Resource Strain (CARDIA)    Difficulty of Paying Living Expenses: Very hard  Food Insecurity: Medium Risk (11/18/2023)   Received from [facility]   Hunger Vital Sign    Within the past 12 months, you worried that your food would run out before you got money to buy more: Never true    Within the past 12 months, the food you bought just didn't last and you didn't have money to get more. : Sometimes true  Transportation Needs: No Transportation Needs (11/18/2023)   Received from [facility]    In the past 12 months, has lack of reliable transportation kept you from medical appointments, meetings, work or from getting things needed for daily living? : No  Physical Activity: Not on file  Stress: Stress Concern Present (05/15/2023)   [employer] of Occupational Health - Occupational Stress Questionnaire    Feeling of Stress : To some extent  Social Connections: Unknown (07/04/2023)   Social Connection and Isolation Panel    Frequency of Communication with Friends and Family: Never    Frequency of Social Gatherings with Friends and Family: Never    Attends Religious Services: 1 to 4 times per year    Active Member of [employer] or Organizations: No    Attends Banker Meetings: Never    Marital Status: Patient declined    Allergies:  Allergies  Allergen Reactions   Latex Hives   Other Other (See Comments), Anaphylaxis, Swelling and Hives    Hair glue causes throat to close and hives glue Hair glue causes throat to close    Codeine  Nausea And Vomiting    Was on an empty stomach.   Nitrofurantoin Rash    Metabolic Disorder Labs: Lab Results  Component Value Date   HGBA1C 7.1 (H) 01/09/2023   MPG 157.07 01/09/2023   MPG 163 10/17/2022   No results found for: PROLACTIN Lab Results  Component Value Date   CHOL 127 11/05/2023   TRIG 66 11/05/2023   HDL 49 (L) 11/05/2023   CHOLHDL 2.6  11/05/2023   VLDL 17 01/09/2023   LDLCALC 63 11/05/2023   LDLCALC 55 10/17/2023   Lab Results  Component Value Date   TSH 0.44 12/06/2022   TSH 2.467 12/05/2020    Therapeutic Level Labs: No results found for: LITHIUM Lab Results  Component Value Date   VALPROATE 51 01/19/2021   VALPROATE 20 (L) 01/05/2021   No results found for: CBMZ  Current Medications: Current Outpatient Medications  Medication Sig Dispense Refill   albuterol  (VENTOLIN  HFA) 108 (90 Base) MCG/ACT inhaler Inhale 2 puffs into the lungs every 6 (six) hours as needed for wheezing or shortness of breath. 18 g 2   alum & mag hydroxide-simeth (MAALOX MAX) 400-400-40 MG/5ML suspension Take 5 mLs by mouth every 6 (six) hours as needed for indigestion. 355 mL 0   BD PEN NEEDLE NANO 2ND GEN 32G X 4 MM MISC 3 (three) times daily. as directed     carvedilol  (COREG ) 6.25 MG tablet TAKE 1 TABLET(6.25 MG) BY MOUTH TWICE DAILY 180 tablet 1   cetirizine  (ZYRTEC ) 10 MG tablet Take 1 tablet (10 mg total) by mouth daily. 30 tablet 11   ELIQUIS  5 MG TABS tablet TAKE 1 TABLET(5 MG) BY MOUTH TWICE DAILY 60 tablet 5   gabapentin  (NEURONTIN ) 100 MG capsule Take 200 mg by mouth at bedtime.     hydrOXYzine  (ATARAX ) 10 MG tablet TAKE 1 TABLET(10 MG) BY MOUTH AT BEDTIME 90 tablet 3   JARDIANCE  10 MG TABS tablet TAKE 1 TABLET(10 MG) BY MOUTH EVERY MORNING 90 tablet 1   losartan  (COZAAR ) 25 MG tablet TAKE 1 TABLET BY MOUTH DAILY 90 tablet 3   metformin (FORTAMET) 500 MG (OSM) 24 hr tablet Take 500 mg by mouth in the morning and at bedtime.     OZEMPIC, 0.25 OR 0.5 MG/DOSE, 2 MG/3ML SOPN Inject 0.5 mg into the skin once a week.  QUEtiapine  (SEROQUEL ) 100 MG tablet TAKE 1 TABLET(100 MG) BY MOUTH AT BEDTIME 30 tablet 6   rivastigmine  (EXELON ) 3 MG capsule TAKE 1 CAPSULE(3 MG) BY MOUTH TWICE DAILY 60 capsule 6   rosuvastatin  (CRESTOR ) 20 MG tablet Take 1.5 tablets (30 mg total) by mouth daily. 135 tablet 3   lamoTRIgine  (LAMICTAL ) 100 MG  tablet Take 1/2 tab twice a day 30 tablet 1   sertraline  (ZOLOFT ) 100 MG tablet Take 1 tablet (100 mg total) by mouth daily. 30 tablet 0   No current facility-administered medications for this visit.     Musculoskeletal: Strength & Muscle Tone: within normal limits Gait & Station: normal Patient leans: N/A  Psychiatric Specialty Exam: Review of Systems  Neurological:  Positive for numbness and headaches.  Psychiatric/Behavioral:  Positive for decreased concentration and sleep disturbance. The patient is nervous/anxious and is hyperactive.     Blood pressure (!) 133/90, pulse 80, height 5' 6 (1.676 m), weight 188 lb (85.3 kg), last menstrual period 12/03/2012.Body mass index is 30.34 kg/m.  General Appearance: Casual  Eye Contact:  Fair  Speech:  fast  Volume:  Increased  Mood:  Angry, Dysphoric, and Irritable  Affect:  Labile  Thought Process:  Descriptions of Associations: Circumstantial  Orientation:  Full (Time, Place, and Person)  Thought Content: Rumination   Suicidal Thoughts:  No  Homicidal Thoughts:  No  Memory:  Immediate;   Fair Recent;   Fair Remote;   Fair  Judgement:  Fair  Insight:  Shallow  Psychomotor Activity:  Increased and Restlessness  Concentration:  Concentration: Fair and Attention Span: Fair  Recall:  [employer] of Knowledge: Fair  Language: Good  Akathisia:  No  Handed:  Right  AIMS (if indicated): not done  Assets:  Communication Skills Desire for Improvement Housing Transportation  ADL's:  Intact  Cognition: Impaired,  Mild  Sleep:  Fair   Screenings: GAD-7    Flowsheet Row Patient Outreach Telephone from 08/01/2023 in [facility]PART[facility] Counselor from 05/23/2023 in [facility]  Total GAD-7 Score 13 20   Mini-Mental    Flowsheet Row Office Visit from 03/01/2022 in [facility]  Total Score (max 30 points ) 10   PHQ2-9    Flowsheet Row Office  Visit from 10/02/2023 in [facility] Medicine [facility] Patient Outreach Telephone from 08/01/2023 in [place] [facility]ULATION HE[facility] DEPARTMENT Office Visit from 07/04/2023 in [facility] Adult Medicine Office Visit from 06/13/2023 in [facility] Adult Medicine Counselor from 05/23/2023 in [facility]  PHQ-2 Total Score 2 2 2 2 4   PHQ-9 Total Score -- 6 9 3 14    Flowsheet Row ED from 09/27/2023 in [facility] Emergency Department at [facility] ED from 09/24/2023 in [facility] Emergency Department at [facility] ED from 05/05/2023 in [facility] Emergency Department at [facility]  C-SSRS RISK CATEGORY No Risk No Risk No Risk     Assessment and Plan: Patient is a 48 year old African-American female with history of hypertension, diabetes, cardiac arrest status post ICD, sleep apnea, B12 deficiency, memory impairment, anoxic brain injury, mood disorder, cannabis use, anger issues and depression.  Review blood work results.  Lab shows still elevation of liver enzymes but better than before.  Still struggling with memory and concentration and anger.  Now she looking for a another job since last job  she was fired.  Today she has a interview.  She felt the medicine helped in the beginning but not working anymore.  We started her on Lamictal  25 mg twice a day.  She has no rash, itching.  Will try 50 mg twice a day.  I do believe patient need therapy to help her coping skills.  We will refer her to see a therapist and had appointment with [place].  Reinforced to monitor closely for side effect specially if she has a rash then she need to stop the medication immediately.  She will continue Zoloft  100 mg daily and she is taking Seroquel  100 mg at bedtime which is helping some of her sleep.  We discussed stopping the cannabis but at this time patient does not ready and feel it is working and keeping her calm.   Discussed interaction of psychotropic medication with cannabis use.  Will follow-up in 4 to 6 weeks.  Patient was given AVS for her medication records.  Collaboration of Care: Collaboration of Care: Other provider involved in patient's care AEB notes are available in epic to review  Patient/Guardian was advised Release of Information must be obtained prior to any record release in order to collaborate their care with an outside provider. Patient/Guardian was advised if they have not already done so to contact the registration department to sign all necessary forms in order for us  to release information regarding their care.   Consent: Patient/Guardian gives verbal consent for treatment and assignment of benefits for services provided during this visit. Patient/Guardian expressed understanding and agreed to proceed.    Leni ONEIDA Client, MD 11/28/2023, 11:50 AM

## 2023-11-28 NOTE — Patient Instructions (Addendum)
 Try generic Zyrtec  at night once daily for nasal congestion > do for at least 14 days

## 2023-11-30 DIAGNOSIS — R748 Abnormal levels of other serum enzymes: Secondary | ICD-10-CM | POA: Diagnosis not present

## 2023-12-03 DIAGNOSIS — R748 Abnormal levels of other serum enzymes: Secondary | ICD-10-CM | POA: Diagnosis not present

## 2023-12-04 ENCOUNTER — Ambulatory Visit (INDEPENDENT_AMBULATORY_CARE_PROVIDER_SITE_OTHER): Admitting: Licensed Clinical Social Worker

## 2023-12-04 ENCOUNTER — Encounter (HOSPITAL_COMMUNITY): Payer: Self-pay

## 2023-12-04 DIAGNOSIS — R29818 Other symptoms and signs involving the nervous system: Secondary | ICD-10-CM | POA: Diagnosis not present

## 2023-12-04 DIAGNOSIS — R4189 Other symptoms and signs involving cognitive functions and awareness: Secondary | ICD-10-CM | POA: Diagnosis not present

## 2023-12-04 DIAGNOSIS — F321 Major depressive disorder, single episode, moderate: Secondary | ICD-10-CM | POA: Diagnosis not present

## 2023-12-04 NOTE — Progress Notes (Signed)
 THERAPIST PROGRESS NOTE   Session Date: 12/04/2023  Session Time: 0815 - 0905  Participation Level: Active  MSE/Presentation: Behavior: Appropriate, Sharing, and Motivated Speech: Normal Thought Process: Coherent and Relevant Cognition: Appropriate and Oriented Mood: Anxious, Depressed, and Hopeless Affect: Congruent, Depressed, and Tearful Insight: Lacking Appearance: Casual  Type of Therapy: Individual Therapy  Treatment Goals addressed:   Initial (8) LTG: Courtney Davies will reduce the amount of anger-related incidents/outbursts by 25% as evidenced by self-report (Anger Management) STG: Courtney Davies will identify situations, thoughts, and feelings that trigger internal anger, and/or angry/aggressive actions as evidenced by self-report (Anger Management) LTG: I want to be able to be around people, learn to talk to someone without being angry and aggressive towards them (Anger Management) LTG: Reduce frequency, intensity, and duration of depression symptoms so that daily functioning is improved (OP Depression) LTG: Increase coping skills to manage depression and improve ability to perform daily activities (OP Depression) STG: Reduce overall depression score by a minimum of 25% on the Patient Health Questionnaire (PHQ-9) (OP Depression) STG: Courtney Davies will identify cognitive patterns and beliefs that support depression (OP Depression) STG: Courtney Davies will reduce frequency of avoidant behaviors by 50% as evidenced by self-report in therapy sessions (OP Depression)  Progress Towards Goals: Initial  Interventions: CBT, Motivational Interviewing, Solution Focused, and Supportive  Summary: Courtney Davies is a 48 y.o. female with psych history of MDD (major depressive disorder), single episode, moderate (HCC)  Neurocognitive deficits, presenting for follow-up therapy session in efforts to improve management of presenting stressors and symptoms.  Patient openly  engaged in session, presenting in primarily depressed moods, and congruent affect throughout duration of visit, with periods of tearfulness as well as proving to brighten at times. 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 Pt openly engaged in initial screening of presenting depressive and anxious sxs via PHQ-9 and GAD-7, noting of sxs having worsened over the past two weeks since having lost job. Pt endorsed pSI, denying plan, intent, means, sharing of primarily feeling down due to having lost job. 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 Actively engaged in exploration of presenting concerns related to MH sxs in aims of identifying individualized tx goals to reflect within tx plan.    Patient responded well to interventions. Patient continues to meet criteria for MDD (major depressive disorder), single episode, moderate (HCC)  Neurocognitive deficits. Patient will continue to benefit from engagement in outpatient therapy due to being the least restrictive service to meet presenting needs.      12/04/2023    8:22 AM 08/01/2023    3:47 PM 05/23/2023    3:18 PM  GAD 7 : Generalized Anxiety Score  Nervous, Anxious, on Edge 3 2 3   Control/stop worrying 3 2 3   Worry too much - different things 3 2 3   Trouble relaxing 0 1 3  Restless 1 1 3   Easily annoyed or irritable 3 3 3   Afraid - awful might happen 2 2 2   Total GAD 7 Score 15 13 20  Anxiety Difficulty Very difficult  Very difficult      12/04/2023    8:28 AM 10/02/2023    9:34 AM  08/01/2023    3:50 PM 07/04/2023    3:24 PM 06/13/2023    3:08 PM  Depression screen PHQ 2/9  Decreased Interest 3 1 1 1 1   Down, Depressed, Hopeless 3 1 1 1 1   PHQ - 2 Score 6 2 2 2 2   Altered sleeping 0  1 0 0  Tired, decreased energy 2  0 1 1  Change in appetite 0  0 0 0  Feeling bad or failure about yourself  2  0 0 0  Trouble concentrating 3  3 3  0  Moving slowly or fidgety/restless 0  0 3 0  Suicidal thoughts 1  0 0 0  PHQ-9 Score 14  6 9 3   Difficult doing work/chores Extremely dIfficult   Not difficult at all Not difficult at all   [facility] Counselor from 12/04/2023 in [facility] at [facility] ED from 09/27/2023 in [facility] Emergency Department at [facility] ED from 09/24/2023 in [facility] Emergency Department at [facility]  C-SSRS RISK CATEGORY Moderate Risk No Risk No Risk    Suicidal/Homicidal: Passive, No plan to harm self or others  Therapist Response: Clinician utilized CBT, MI, psychoed, and supportive reflection interventions to support patient in efforts to address presenting sxs and challenges surrounding presenting stressors.  Clinician actively greeted pt upon presenting for virtual visit, assessing presenting moods and affect. Openly engaged pt in check-in, sharing of clinician's role and structure of today's visit. Actively engaged pt in assessing presenting sxs via PHQ-9 and GAD-7, and completion of CSSRS. Engaged in exploration of areas of concern to be focus of tx, actively supporting pt in identifying applicable, individualized goals to be contained within tx plan. Supported pt in scheduling of future visits.  Clinician reassessed severity of presenting sxs, and presence of any safety concerns. Clinician provided support and empathy to patient during session.  Homework: None.  Plan: Return again in 1 weeks.  Diagnosis:  Encounter Diagnoses  Name Primary?   MDD (major depressive disorder), single  episode, moderate (HCC) Yes   Neurocognitive deficits     Collaboration of Care: Psychiatrist AEB provider documentation available in EHR.  Patient/Guardian was advised Release of Information must be obtained prior to any record release in order to collaborate their care with an outside provider. Patient/Guardian was advised if they have not already done so to contact the registration department to sign all necessary forms in order for us  to release information regarding their care.   Consent: Patient/Guardian gives verbal consent for treatment and assignment of benefits for services provided during this visit. Patient/Guardian expressed understanding and agreed to proceed.   [place] JONETTA Maris, MSW, LCSW 12/04/2023,  8:56 AM

## 2023-12-05 DIAGNOSIS — E538 Deficiency of other specified B group vitamins: Secondary | ICD-10-CM | POA: Diagnosis not present

## 2023-12-17 ENCOUNTER — Other Ambulatory Visit: Payer: Self-pay | Admitting: Orthopedic Surgery

## 2023-12-17 DIAGNOSIS — E1165 Type 2 diabetes mellitus with hyperglycemia: Secondary | ICD-10-CM

## 2023-12-18 ENCOUNTER — Encounter: Attending: Physical Medicine & Rehabilitation | Admitting: Physical Medicine & Rehabilitation

## 2023-12-18 ENCOUNTER — Ambulatory Visit (HOSPITAL_COMMUNITY): Admitting: Licensed Clinical Social Worker

## 2023-12-18 ENCOUNTER — Other Ambulatory Visit (HOSPITAL_COMMUNITY): Payer: Self-pay | Admitting: Psychiatry

## 2023-12-18 DIAGNOSIS — R29818 Other symptoms and signs involving the nervous system: Secondary | ICD-10-CM

## 2023-12-18 DIAGNOSIS — F1290 Cannabis use, unspecified, uncomplicated: Secondary | ICD-10-CM

## 2023-12-18 DIAGNOSIS — F321 Major depressive disorder, single episode, moderate: Secondary | ICD-10-CM | POA: Insufficient documentation

## 2023-12-18 DIAGNOSIS — R413 Other amnesia: Secondary | ICD-10-CM | POA: Insufficient documentation

## 2023-12-18 DIAGNOSIS — G931 Anoxic brain damage, not elsewhere classified: Secondary | ICD-10-CM | POA: Insufficient documentation

## 2023-12-18 DIAGNOSIS — G479 Sleep disorder, unspecified: Secondary | ICD-10-CM | POA: Insufficient documentation

## 2023-12-18 DIAGNOSIS — R4189 Other symptoms and signs involving cognitive functions and awareness: Secondary | ICD-10-CM | POA: Insufficient documentation

## 2023-12-19 ENCOUNTER — Telehealth: Payer: Self-pay | Admitting: Orthopaedic Surgery

## 2023-12-19 ENCOUNTER — Encounter: Payer: Self-pay | Admitting: Cardiology

## 2023-12-19 ENCOUNTER — Ambulatory Visit (INDEPENDENT_AMBULATORY_CARE_PROVIDER_SITE_OTHER): Admitting: Licensed Clinical Social Worker

## 2023-12-19 DIAGNOSIS — R4189 Other symptoms and signs involving cognitive functions and awareness: Secondary | ICD-10-CM

## 2023-12-19 DIAGNOSIS — F1290 Cannabis use, unspecified, uncomplicated: Secondary | ICD-10-CM | POA: Diagnosis not present

## 2023-12-19 DIAGNOSIS — R29818 Other symptoms and signs involving the nervous system: Secondary | ICD-10-CM | POA: Diagnosis not present

## 2023-12-19 DIAGNOSIS — F321 Major depressive disorder, single episode, moderate: Secondary | ICD-10-CM | POA: Diagnosis not present

## 2023-12-19 NOTE — Telephone Encounter (Signed)
 Patient states she has a lot going on right now and is unable to have left total knee replacement.  Patient has rescheduled for 02/03/24.

## 2023-12-19 NOTE — Progress Notes (Signed)
 THERAPIST PROGRESS NOTE   Session Date: 12/19/2023  Session Time: 1015 - 1115  Participation Level: Active  MSE/Presentation: Behavior: Appropriate and Sharing Speech: Normal Thought Process: Coherent and Relevant Cognition: Appropriate and Oriented Mood: Anxious, Depressed, and Hopeless Affect: Congruent, Depressed, and Tearful Insight: Lacking Appearance: Casual  Type of Therapy: Individual Therapy  Treatment Goals addressed:   Initial (8) LTG: Courtney Davies will reduce the amount of anger-related incidents/outbursts by 25% as evidenced by self-report (Anger Management) STG: Courtney Davies will identify situations, thoughts, and feelings that trigger internal anger, and/or angry/aggressive actions as evidenced by self-report (Anger Management) LTG: I want to be able to be around people, learn to talk to someone without being angry and aggressive towards them (Anger Management) LTG: Reduce frequency, intensity, and duration of depression symptoms so that daily functioning is improved (OP Depression) LTG: Increase coping skills to manage depression and improve ability to perform daily activities (OP Depression) STG: Reduce overall depression score by a minimum of 25% on the Patient Health Questionnaire (PHQ-9) (OP Depression) STG: Courtney Davies will identify cognitive patterns and beliefs that support depression (OP Depression) STG: Courtney Davies will reduce frequency of avoidant behaviors by 50% as evidenced by self-report in therapy sessions (OP Depression)  Progress Towards Goals: Not Progressing  Interventions: CBT, Motivational Interviewing, Solution Focused, and Supportive  Summary: Courtney Davies is a 48 y.o. female with psych history of MDD, Cannabis Use D/O, Neurocognitive deficits, presenting for follow-up therapy session in efforts to improve management of presenting stressors and symptoms.  Patient openly engaged in session, presenting in overall  depressed moods, and congruent affect throughout duration of visit. Pt detailed ongoing challenges experienced over the past two weeks surrounding employment, financial challenges, and physical challenges. Pt detailed of having not received any follow up contact surrounding potential employment opportunity she had been pursuing at time of last visit, becoming tearful and sharing of increased depressive feelings due to wanting to work. Pt additionally shared of stress related to financial needs, specific to utility bills being increasingly high and uncertainties surrounding the reason as to why, sharing of plans to visit utility company local office to address matters. Actively engaged in processing individual efforts at managing stress, sharing of enjoying cooking, but finding self encountering challenges doing so due to physical limitations and neurological complications. Processed additional benefits of reconnecting with neurology in efforts to determine organization with whom pt received assessment approx. 34yr ago in relation to limitations to better support in home environment. Pt actively engaged in exploration of potential benefits of pursuing higher LOC in efforts to better support pt's overall management of stress and presenting sxs, proving receptive to possible exploration in the future. Reassessed presenting depressive and anxious sxs via PHQ-9 and GAD-7, noting of maintained increase in screening scores, processing increase in sxs in relation to presenting stress.  Patient responded well to interventions. Patient continues to meet criteria for MDD, Cannabis Use D/O, Neurocognitive deficits. Patient will continue to benefit from engagement in outpatient therapy due to being the least restrictive service to meet presenting needs.      12/19/2023   10:56 AM 12/04/2023    8:22 AM 08/01/2023    3:47 PM 05/23/2023    3:18 PM  GAD 7 : Generalized Anxiety Score  Nervous, Anxious, on Edge 3 3 2 3    Control/stop worrying 3 3 2 3   Worry too much - different things 3 3 2 3   Trouble relaxing 1 0 1 3  Restless 3 1 1 3   Easily annoyed or irritable 3 3 3 3   Afraid - awful might happen 1 2 2 2   Total GAD 7 Score 17 15 13 20   Anxiety Difficulty Extremely difficult Very difficult  Very difficult      12/19/2023   10:59 AM 12/04/2023    8:28 AM 10/02/2023    9:34 AM 08/01/2023    3:50 PM 07/04/2023    3:24 PM  Depression screen PHQ 2/9  Decreased Interest 3 3 1 1 1   Down, Depressed, Hopeless 3 3 1 1 1   PHQ - 2 Score 6 6 2 2 2   Altered sleeping 1 0  1 0  Tired, decreased energy 3 2  0 1  Change in appetite 2 0  0 0  Feeling bad or failure about yourself  3 2  0 0  Trouble concentrating 3 3  3 3   Moving slowly or fidgety/restless 3 0  0 3  Suicidal thoughts 0 1  0 0  PHQ-9 Score 21 14  6 9   Difficult doing work/chores Extremely dIfficult Extremely dIfficult   Not difficult at all   [employer] from 12/04/2023 in [facility] at [facility] ED from 09/27/2023 in [facility] Emergency Department at [facility] ED from 09/24/2023 in [facility] Emergency Department at [facility]  C-SSRS RISK CATEGORY Moderate Risk No Risk No Risk    Suicidal/Homicidal: Passive, No plan to harm self or others  Therapist Response: Clinician utilized CBT, MI, psychoed, and supportive reflection interventions to support patient in efforts to address presenting sxs and challenges surrounding presenting stressors.  Clinician openly greeted pt upon presenting for visit, assessing presenting moods and affect. Openly engaged pt in check-in, utilizing open ended questions to elicit pt recounts of events, continued navigation of ongoing stressors, and factors contributing to presenting moods.  Utilized active listening techniques to support pt in further processing of thoughts, feelings, and perspectives surrounding presenting stressors and challenges, exploring  potential means of navigating presenting challenges. Engaged in exploration of available community resources pt may prove eligible for considering disability factors and presenting needs.  Clinician reassessed severity of presenting sxs, and presence of any safety concerns. Clinician provided support and empathy to patient during session.  Homework: None.  Plan: Return again in 1 weeks.  Diagnosis:  Encounter Diagnoses  Name Primary?   MDD (major depressive disorder), single episode, moderate (HCC) Yes   Cannabis use disorder    Neurocognitive deficits      Collaboration of Care: Psychiatrist AEB provider documentation available in EHR.  Patient/Guardian was advised Release of Information must be obtained prior to any record release in order to collaborate their care with an outside provider. Patient/Guardian was advised if they have not already done so to contact the registration department to sign all necessary forms in order for us  to release information regarding their care.   Consent: Patient/Guardian gives verbal consent for treatment and assignment of benefits for services provided during this visit. Patient/Guardian expressed understanding and agreed to proceed.   Lynwood JONETTA Maris, MSW, LCSW 12/19/2023,  11:02 AM

## 2023-12-23 ENCOUNTER — Encounter (HOSPITAL_BASED_OUTPATIENT_CLINIC_OR_DEPARTMENT_OTHER): Payer: Self-pay | Admitting: Emergency Medicine

## 2023-12-23 ENCOUNTER — Other Ambulatory Visit: Payer: Self-pay

## 2023-12-23 DIAGNOSIS — Z91040 Latex allergy status: Secondary | ICD-10-CM | POA: Diagnosis not present

## 2023-12-23 DIAGNOSIS — N644 Mastodynia: Secondary | ICD-10-CM | POA: Insufficient documentation

## 2023-12-23 DIAGNOSIS — Z7901 Long term (current) use of anticoagulants: Secondary | ICD-10-CM | POA: Insufficient documentation

## 2023-12-23 DIAGNOSIS — B351 Tinea unguium: Secondary | ICD-10-CM | POA: Diagnosis not present

## 2023-12-23 NOTE — ED Triage Notes (Signed)
 Pt reports L middle finger nail discoloration and pain x 2 days.   Also c/o R breast pain x 3 days. Tender to touch.

## 2023-12-24 ENCOUNTER — Emergency Department (HOSPITAL_BASED_OUTPATIENT_CLINIC_OR_DEPARTMENT_OTHER)
Admission: EM | Admit: 2023-12-24 | Discharge: 2023-12-24 | Disposition: A | Discharge status: Home / Self Care | Attending: Emergency Medicine | Admitting: Emergency Medicine

## 2023-12-24 DIAGNOSIS — N644 Mastodynia: Secondary | ICD-10-CM

## 2023-12-24 DIAGNOSIS — B351 Tinea unguium: Secondary | ICD-10-CM

## 2023-12-24 NOTE — ED Provider Notes (Signed)
 [facility]  Provider Note  CSN: [id number] Arrival date & time: 12/23/23 2040  History Chief Complaint  Patient presents with   Nail Problem   Breast Pain    Courtney Davies is a 48 y.o. female here for L middle fingernaill discoloration and R breast pain for the last several days/weeks. She took her artificial nail off a few days ago and noticed the discolorization.   She also reports R breast pain and feels a knot. She has not called her PCP for either problem.    Home Medications Prior to Admission medications   Medication Sig Start Date End Date Taking? Authorizing Provider  albuterol  (VENTOLIN  HFA) 108 (90 Base) MCG/ACT inhaler Inhale 2 puffs into the lungs every 6 (six) hours as needed for wheezing or shortness of breath. 01/24/21   Love, Sharlet RAMAN, PA-C  alum & mag hydroxide-simeth (MAALOX MAX) 400-400-40 MG/5ML suspension Take 5 mLs by mouth every 6 (six) hours as needed for indigestion. 09/24/23   Pamella Ozell LABOR, DO  BD PEN NEEDLE NANO 2ND GEN 32G X 4 MM MISC 3 (three) times daily. as directed 07/11/21   [provider]  carvedilol  (COREG ) 6.25 MG tablet TAKE 1 TABLET(6.25 MG) BY MOUTH TWICE DAILY 11/14/23   [name], [name], NP  cetirizine  (ZYRTEC ) 10 MG tablet Take 1 tablet (10 mg total) by mouth daily. 11/28/23   [name], [name], NP  ELIQUIS  5 MG TABS tablet TAKE 1 TABLET(5 MG) BY MOUTH TWICE DAILY 09/23/23   [name], [name], NP  gabapentin  (NEURONTIN ) 100 MG capsule Take 200 mg by mouth at bedtime.    [provider]  hydrOXYzine  (ATARAX ) 10 MG tablet TAKE 1 TABLET(10 MG) BY MOUTH AT BEDTIME 11/18/23   [name], [name], NP  JARDIANCE  10 MG TABS tablet TAKE 1 TABLET(10 MG) BY MOUTH EVERY MORNING 12/17/23   [name], [name], NP  lamoTRIgine  (LAMICTAL ) 100 MG tablet Take 1/2 tab twice a day 11/28/23   Arfeen, Leni [name], MD  losartan  (COZAAR ) 25 MG tablet TAKE 1 TABLET BY MOUTH DAILY 01/29/23   [name], [name], NP  metformin (FORTAMET) 500 MG  (OSM) 24 hr tablet Take 500 mg by mouth in the morning and at bedtime.    [provider]  OZEMPIC, 0.25 OR 0.5 MG/DOSE, 2 MG/3ML SOPN Inject 0.5 mg into the skin once a week. 01/06/23   [provider]  QUEtiapine  (SEROQUEL ) 100 MG tablet TAKE 1 TABLET(100 MG) BY MOUTH AT BEDTIME 09/04/23   Babs Arthea [name], MD  rivastigmine  (EXELON ) 3 MG capsule TAKE 1 CAPSULE(3 MG) BY MOUTH TWICE DAILY 08/09/23   Babs Arthea [name], MD  rosuvastatin  (CRESTOR ) 20 MG tablet Take 1.5 tablets (30 mg total) by mouth daily. 09/26/23   [name], [name], NP  sertraline  (ZOLOFT ) 100 MG tablet Take 1 tablet (100 mg total) by mouth daily. 11/28/23   Arfeen, Leni [name], MD     Allergies    Latex, Other, Codeine , and Nitrofurantoin   Review of Systems   Review of Systems Please see HPI for pertinent positives and negatives  Physical Exam BP (!) 179/90   Pulse 68   Temp 98.3 F (36.8 C) (Oral)   Resp 15   Ht 5' 6 (1.676 m)   Wt 82.6 kg   LMP 12/03/2012   SpO2 100%   BMI 29.38 kg/m   Physical Exam Vitals and nursing note reviewed. Exam conducted with a chaperone present.  HENT:     Head: Normocephalic.     Nose: Nose normal.  Eyes:     Extraocular Movements: Extraocular movements intact.  Pulmonary:     Effort: Pulmonary effort is normal.  Chest:     Comments: Tenderness over the entire R breast without palpable mass, signs of infection or nipple abnormality Musculoskeletal:        General: Normal range of motion.     Cervical back: Neck supple.     Comments: Discolorizaton of the R middle fingernail without signs of cellulitis/paronychia  Skin:    Findings: No rash (on exposed skin).  Neurological:     Mental Status: She is alert and oriented to person, place, and time.  Psychiatric:        Mood and Affect: Mood normal.     ED Results / Procedures / Treatments   EKG None  Procedures Procedures  Medications Ordered in the ED Medications - No data to display  Initial  Impression and Plan  Patient advised to follow up with her PCP for evaluation and management of her finger nail and breast concerns. No emergent condition identified. She expresses frustration over the availability of her PCP. Given referral to PCP here if she wants to discuss switching.   ED Course       MDM Rules/Calculators/A&P Medical Decision Making Problems Addressed: Breast pain: undiagnosed new problem with uncertain prognosis Onychomycosis: undiagnosed new problem with uncertain prognosis     Final Clinical Impression(s) / ED Diagnoses Final diagnoses:  Onychomycosis  Breast pain    Rx / DC Orders ED Discharge Orders     None        Roselyn Carlin NOVAK, MD 12/24/23 [phone]

## 2023-12-25 ENCOUNTER — Ambulatory Visit (INDEPENDENT_AMBULATORY_CARE_PROVIDER_SITE_OTHER): Admitting: Licensed Clinical Social Worker

## 2023-12-25 DIAGNOSIS — R29818 Other symptoms and signs involving the nervous system: Secondary | ICD-10-CM | POA: Diagnosis not present

## 2023-12-25 DIAGNOSIS — F321 Major depressive disorder, single episode, moderate: Secondary | ICD-10-CM

## 2023-12-25 DIAGNOSIS — R4189 Other symptoms and signs involving cognitive functions and awareness: Secondary | ICD-10-CM | POA: Diagnosis not present

## 2023-12-25 DIAGNOSIS — F1290 Cannabis use, unspecified, uncomplicated: Secondary | ICD-10-CM

## 2023-12-25 NOTE — Progress Notes (Signed)
 THERAPIST PROGRESS NOTE   Session Date: 12/25/2023  Session Time: 1010 - 1054  Participation Level: Active  MSE/Presentation: Behavior: Appropriate and Sharing Speech: Normal Thought Process: Coherent and Relevant Cognition: Appropriate and Oriented Mood: Anxious, Depressed, and Euthymic Affect: Congruent, Depressed, and Tearful Insight: Improving Appearance: Casual  Type of Therapy: Individual Therapy  Treatment Goals addressed:   Initial (8) LTG: Clarice Lady L will reduce the amount of anger-related incidents/outbursts by 25% as evidenced by self-report (Anger Management) STG: [name] will identify situations, thoughts, and feelings that trigger internal anger, and/or angry/aggressive actions as evidenced by self-report (Anger Management) LTG: I want to be able to be around people, learn to talk to someone without being angry and aggressive towards them (Anger Management) LTG: Reduce frequency, intensity, and duration of depression symptoms so that daily functioning is improved (OP Depression) LTG: Increase coping skills to manage depression and improve ability to perform daily activities (OP Depression) STG: Reduce overall depression score by a minimum of 25% on the Patient Health Questionnaire (PHQ-9) (OP Depression) STG: [name] will identify cognitive patterns and beliefs that support depression (OP Depression) STG: Evadna Lady L will reduce frequency of avoidant behaviors by 50% as evidenced by self-report in therapy sessions (OP Depression)  Progress Towards Goals: Not Progressing  Interventions: CBT, Motivational Interviewing, Solution Focused, and Supportive  Summary: [name] is a 48 y.o. female with psych history of MDD, Cannabis Use D/O, Neurocognitive deficits, presenting for follow-up therapy session in efforts to improve management of presenting stressors and symptoms.  Patient openly engaged in session, presenting in overall  pleasant moods, and congruent affect, with brief periods of frustration and tearfulness throughout duration of visit. Pt detailed Doing alright today, further sharing of needing an oil change, presenting financial stressors, and individual efforts at exploring employment opportunities. Pt further detailed having learned of being eligible for a home health aid, reflecting on details previously shared during last weeks visit, sharing of having made individual efforts to slow down on tasks/actions, specifically in relation to cooking in aims of avoiding cutting self. Pt provided details in relation to having still not received correspondence from HR with [employer] regarding potential opportunities to transfer to alternate department, detailing of having made efforts to explore alternate employment options in local community. Pt shared of continuing to experience increased stress and depressive sxs surrounding presenting challenges, noting of sitting at home and crying to herself frequently. Revisited pt's interest in exploration of higher LOC in aims of better supporting presenting needs, noting of transportation to [place] area proving to be challenging due to financial strain, proving open to exploring available services in pt's local community around [employer]. Collaborated in exploration of providers in local community, identifying [facility] as possible beneficial resource, providing details for pt to contact directly in exploration of availability of disability services, employment support programs, and availability of IOP.     12/19/2023   10:56 AM 12/04/2023    8:22 AM 08/01/2023    3:47 PM 05/23/2023    3:18 PM  GAD 7 : Generalized Anxiety Score  Nervous, Anxious, on Edge 3 3 2 3   Control/stop worrying 3 3 2 3   Worry too much - different things 3 3 2 3   Trouble relaxing 1 0 1 3  Restless 3 1 1 3   Easily annoyed or irritable 3 3 3 3   Afraid - awful might happen 1 2 2 2   Total GAD 7 Score 17 15 13   20  Anxiety Difficulty Extremely difficult Very difficult  Very difficult      12/19/2023   10:59 AM 12/04/2023    8:28 AM 10/02/2023    9:34 AM 08/01/2023    3:50 PM 07/04/2023    3:24 PM  Depression screen PHQ 2/9  Decreased Interest 3 3 1 1 1   Down, Depressed, Hopeless 3 3 1 1 1   PHQ - 2 Score 6 6 2 2 2   Altered sleeping 1 0  1 0  Tired, decreased energy 3 2  0 1  Change in appetite 2 0  0 0  Feeling bad or failure about yourself  3 2  0 0  Trouble concentrating 3 3  3 3   Moving slowly or fidgety/restless 3 0  0 3  Suicidal thoughts 0 1  0 0  PHQ-9 Score 21 14  6 9   Difficult doing work/chores Extremely dIfficult Extremely dIfficult   Not difficult at all   Flowsheet Row ED from 12/24/2023 in [facility] at [facility] Counselor from 12/04/2023 in [facility] ED from 09/27/2023 in [facility] Department at [facility]  C-SSRS RISK CATEGORY No Risk Moderate Risk No Risk    Suicidal/Homicidal: Passive, No plan to harm self or others  Therapist Response: Clinician utilized CBT, MI, psychoed, and supportive reflection interventions to support patient in efforts to address presenting sxs and challenges surrounding presenting stressors.  Clinician openly greeted pt upon presenting for visit, assessing presenting moods and affect, actively engaging pt in in check-in, utilizing open ended questions in aims of eliciting pt recounts of daily and recent events, current, and ongoing stress surrounding presenting challenges, individual efforts at navigating such challenges, and varying factors contributing to presenting moods.  Utilized active listening techniques to support patient and recounts of events and ongoing challenges, providing support and validation of shared feelings and perspectives in relation to presenting stressors.  Supported patient in exploring available community resources to better support  presenting mental health needs at this time, as well as availability of disability services within patient's local community to further support patient's other noted areas of challenges. Clinician reassessed severity of presenting sxs, and presence of any safety concerns. Patient continues to meet criteria for MDD, Cannabis Use D/O, Neurocognitive deficits. Patient will benefit from continued engagement in outpatient therapy while awaiting securing of enhanced services to better support pt's presenting needs.  Patient has not proved to be making measurable progress at this time.  Homework: None.  Plan: Return again in 1 weeks.  Diagnosis:  Encounter Diagnoses  Name Primary?   MDD (major depressive disorder), single episode, moderate (HCC) Yes   Cannabis use disorder    Neurocognitive deficits       Collaboration of Care: Psychiatrist AEB provider documentation available in EHR.  Patient/Guardian was advised Release of Information must be obtained prior to any record release in order to collaborate their care with an outside provider. Patient/Guardian was advised if they have not already done so to contact the registration department to sign all necessary forms in order for us  to release information regarding their care.   Consent: Patient/Guardian gives verbal consent for treatment and assignment of benefits for services provided during this visit. Patient/Guardian expressed understanding and agreed to proceed.   Lynwood JONETTA Maris, MSW, LCSW 12/25/2023,  10:45 AM

## 2023-12-26 ENCOUNTER — Ambulatory Visit (INDEPENDENT_AMBULATORY_CARE_PROVIDER_SITE_OTHER): Admitting: Orthopedic Surgery

## 2023-12-26 ENCOUNTER — Encounter: Payer: Self-pay | Admitting: Orthopedic Surgery

## 2023-12-26 VITALS — BP 124/78 | HR 76 | Temp 96.6°F | Resp 16 | Ht 66.0 in | Wt 192.0 lb

## 2023-12-26 DIAGNOSIS — E1165 Type 2 diabetes mellitus with hyperglycemia: Secondary | ICD-10-CM | POA: Diagnosis not present

## 2023-12-26 DIAGNOSIS — F321 Major depressive disorder, single episode, moderate: Secondary | ICD-10-CM | POA: Diagnosis not present

## 2023-12-26 DIAGNOSIS — L608 Other nail disorders: Secondary | ICD-10-CM

## 2023-12-26 DIAGNOSIS — R748 Abnormal levels of other serum enzymes: Secondary | ICD-10-CM | POA: Diagnosis not present

## 2023-12-26 DIAGNOSIS — N644 Mastodynia: Secondary | ICD-10-CM

## 2023-12-26 MED ORDER — ACCU-CHEK GUIDE ME W/DEVICE KIT: 1.0000 | PACK | 0 refills | Status: AC

## 2023-12-26 MED ORDER — ACCU-CHEK SOFTCLIX LANCETS MISC: 12 refills | Status: AC

## 2023-12-26 NOTE — Patient Instructions (Addendum)
 Please have [facility] send recent note  Try tea tree oil to finger nail to treat fungus  Schedule fasting lab visit> do not eat or drink 12 hours before blood is drawn> water  is ok  Continue therapy sessions for depression

## 2023-12-26 NOTE — Progress Notes (Signed)
 Careteam: Patient Care Team: [name] BRAVO, NP as PCP - General (Adult Health Nurse Practitioner) [name], MD as PCP - Cardiology (Cardiology) [name], MD as PCP - Electrophysiology (Cardiology) Boneta, [name], PA-C (Family Medicine) Gordy Channing LABOR, RN as Registered Nurse Delene Thersia JINNY georgann DESIREE Care Management  Seen by: [name], AGNP-C  PLACE OF SERVICE:  [facility]  Advanced Directive information    Allergies  Allergen Reactions   Latex Hives   Other Other (See Comments), Anaphylaxis, Swelling and Hives    Hair glue causes throat to close and hives glue Hair glue causes throat to close    Codeine  Nausea And Vomiting    Was on an empty stomach.   Nitrofurantoin Rash    Chief Complaint  Patient presents with   Hospitalization Follow-up    12/24/2023 for Breast pain, and Middle finger on left hand nail fungus.      HPI: Patient is a 48 y.o. female  seen today for acute visit due to fingernail discoloration and intermittent breast pain.   Discussed the use of AI scribe software for clinical note transcription with the patient, who gave verbal consent to proceed.  History of Present Illness   Courtney Davies is a 48 year old female who presents with left middle fingernail discoloration and right breast pain.  She noticed discoloration on her left middle fingernail after removing an acrylic nail, with no associated trauma or pain. Urgent care suggested a possible fungal infection, and she has not yet received treatment.  She experiences right breast pain and has detected a palpable knot in the area. Recent mammogram was negative. The pain persists and is described as being near the rib. She only experiences pain when she touches area.   She has a history of brain injury affecting her memory and daily functioning, leading to frustration. She is seeking mental health support to manage these symptoms. She is followed by  psych. Recent PNQ 9 score 21, GAD 17.   She is also following GI specalist with [facility] due to recent elevated liver enzymes. She has been off statin at this time She is not currently on cholesterol medication due to these concerns and has a history of prolonged antibiotic use, which may have contributed to the liver enzyme issue.       Review of Systems:  Review of Systems  Constitutional: Negative.   HENT: Negative.    Respiratory: Negative.    Cardiovascular: Negative.   Gastrointestinal: Negative.   Genitourinary: Negative.   Skin:        Discoloration to left hand middle fingernail  Neurological: Negative.   Psychiatric/Behavioral:  Positive for depression and memory loss. The patient is nervous/anxious.     Past Medical History:  Diagnosis Date   Anemia 09/2015   Anoxic brain injury (HCC)    Arthritis    knees (04/23/2017)   Asthma    teens; went away; came back (04/23/2017)   B12 deficiency anemia 04/23/2017   Cardiac arrest (HCC)    Chronic bronchitis (HCC)    Chronic low back pain with sciatica    Chronic lower back pain    Diabetes mellitus without complication (HCC)    Diabetes type 2, controlled (HCC)    Elevated ferritin level    Fatty liver    GERD (gastroesophageal reflux disease)    GERD with stricture    Headache    1-2/wk (04/23/2017)   History of  blood transfusion plenty   related to anemia (04/23/2017)   Hypertension    Inguinal hernia    Low back pain    Migraine    1-2/month (04/23/2017)   OA (osteoarthritis) of knee--left    Paroxysmal atrial fibrillation (HCC)    Sub-clinical -seen on device?, elevated CHADSVASC   Symptomatic anemia 04/23/2017   Vitamin B 12 deficiency    Past Surgical History:  Procedure Laterality Date   ABDOMINAL HYSTERECTOMY  YRS AGO   COMPLETE   CESAREAN SECTION  1998; 2002   ESOPHAGEAL DILATION  02/2020   by Dr Marvis   INGUINAL HERNIA REPAIR Bilateral 1980s    total of 4 surgeries (04/23/2017)    IR GASTROSTOMY TUBE MOD SED  12/13/2020   KNEE ARTHROSCOPY WITH MEDIAL MENISECTOMY Left 01/30/2018   Procedure: LEFT KNEE ARTHROSCOPY WITH PARTIAL LATERAL MENISCECTOMY;  Surgeon: Vernetta Lonni GRADE, MD;  Location: [facility];  Service: Orthopedics;  Laterality: Left;   KNEE CARTILAGE SURGERY Left    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 Her smoking use included cigarettes. She has never used smokeless tobacco. She reports that she does not currently use alcohol. She reports current drug use. Drug: Marijuana.  Family History  Problem Relation Age of Onset   Stroke Mother    Diabetes Mother    Diabetes Father    Stroke Maternal Grandmother    Diabetes Maternal Grandmother    Anemia Paternal Grandmother    Valvular heart disease Paternal Grandmother    Hypertension Mother    Prostate cancer Maternal Uncle        ? intestinal also    Medications: Patient's Medications  New Prescriptions   No medications on file  Previous Medications   ACCU-CHEK GUIDE TEST TEST STRIP    1 each by Other route daily. Check blood sugars daily. Dx code E10.9   ALBUTEROL  (VENTOLIN  HFA) 108 (90 BASE) MCG/ACT INHALER    Inhale 2 puffs into the lungs every 6 (six) hours as needed for wheezing or shortness of breath.   ALUM & MAG HYDROXIDE-SIMETH (MAALOX MAX) 400-400-40 MG/5ML SUSPENSION    Take 5 mLs by mouth every 6 (six) hours as needed for indigestion.   BD PEN NEEDLE NANO 2ND GEN 32G X 4 MM MISC    3 (three) times daily. as directed   CARVEDILOL  (COREG ) 6.25 MG TABLET    TAKE 1 TABLET(6.25 MG) BY MOUTH TWICE DAILY   CETIRIZINE  (ZYRTEC ) 10 MG TABLET    Take 1 tablet (10 mg total) by mouth daily.   ELIQUIS  5 MG TABS TABLET    TAKE 1 TABLET(5 MG) BY MOUTH TWICE DAILY   GABAPENTIN  (NEURONTIN ) 100 MG  CAPSULE    Take 200 mg by mouth at bedtime.   HYDROXYZINE  (ATARAX ) 10 MG TABLET    TAKE 1 TABLET(10 MG) BY MOUTH AT BEDTIME   JARDIANCE  10 MG TABS TABLET    TAKE 1 TABLET(10 MG) BY MOUTH EVERY MORNING   LAMOTRIGINE  (LAMICTAL ) 100 MG TABLET    Take 1/2 tab twice a day   LINZESS  145 MCG CAPS CAPSULE    Take 145 mcg by mouth daily.   LOSARTAN  (COZAAR ) 25 MG TABLET    TAKE 1 TABLET BY MOUTH DAILY   METFORMIN (FORTAMET) 500  MG (OSM) 24 HR TABLET    Take 500 mg by mouth in the morning and at bedtime.   OMEPRAZOLE (PRILOSEC) 40 MG CAPSULE    Take 40 mg by mouth 2 (two) times daily.   OZEMPIC, 0.25 OR 0.5 MG/DOSE, 2 MG/3ML SOPN    Inject 0.5 mg into the skin once a week.   QUETIAPINE  (SEROQUEL ) 100 MG TABLET    TAKE 1 TABLET(100 MG) BY MOUTH AT BEDTIME   RIVASTIGMINE  (EXELON ) 3 MG CAPSULE    TAKE 1 CAPSULE(3 MG) BY MOUTH TWICE DAILY   ROSUVASTATIN  (CRESTOR ) 20 MG TABLET    Take 1.5 tablets (30 mg total) by mouth daily.   SERTRALINE  (ZOLOFT ) 100 MG TABLET    Take 1 tablet (100 mg total) by mouth daily.  Modified Medications   No medications on file  Discontinued Medications   No medications on file    Physical Exam:  Vitals:   12/26/23 1411  BP: 124/78  Pulse: 76  Resp: 16  Temp: (!) 96.6 F (35.9 C)  SpO2: 98%  Weight: 192 lb (87.1 kg)  Height: 5' 6 (1.676 m)   Body mass index is 30.99 kg/m. Wt Readings from Last 3 Encounters:  12/26/23 192 lb (87.1 kg)  12/23/23 182 lb (82.6 kg)  11/28/23 188 lb 12.8 oz (85.6 kg)    Physical Exam Vitals reviewed.  Constitutional:      General: She is not in acute distress. HENT:     Head: Normocephalic.  Eyes:     General:        Right eye: No discharge.        Left eye: No discharge.  Cardiovascular:     Rate and Rhythm: Normal rate and regular rhythm.     Pulses: Normal pulses.     Heart sounds: Normal heart sounds.  Pulmonary:     Effort: Pulmonary effort is normal.     Breath sounds: Normal breath sounds.  Chest:     Chest  wall: No mass, deformity, swelling or tenderness.  Abdominal:     General: Bowel sounds are normal.     Palpations: Abdomen is soft.  Musculoskeletal:        General: Normal range of motion.     Cervical back: Neck supple.  Skin:    General: Skin is warm.     Capillary Refill: Capillary refill takes less than 2 seconds.     Comments: Black discoloration to left hand 3rd digit, discoloration small and round, no swelling, tenderness or drainage  Neurological:     General: No focal deficit present.     Mental Status: She is alert and oriented to person, place, and time.  Psychiatric:        Mood and Affect: Mood normal.     Labs reviewed: Basic Metabolic Panel: Recent Labs    10/17/23 0907 10/28/23 1552 11/05/23 0925  NA 139 141 139  K 4.3 4.2 4.1  CL 106 108 107  CO2 24 25 23   GLUCOSE 86 75 84  BUN 14 14 18   CREATININE 0.66 0.66 0.64  CALCIUM  9.4 9.5 9.0   Liver Function Tests: Recent Labs    01/08/23 1117 06/13/23 1552 09/27/23 2123 10/17/23 0907 10/28/23 1552 11/05/23 0925  AST 22   < > 123* 48* 51* 33  ALT 19   < > 204* 115* 96* 55*  ALKPHOS 97  --  319*  --   --   --   BILITOT 0.4   < >  0.5 0.5 0.4 0.4  PROT 7.0   < > 8.1 7.4 7.4 6.8  ALBUMIN 3.6  --  4.5  --   --   --    < > = values in this interval not displayed.   Recent Labs    03/05/23 1415  LIPASE 29   No results for input(s): AMMONIA in the last 8760 hours. CBC: Recent Labs    06/13/23 1552 07/12/23 1618 09/27/23 2123  WBC 8.1 7.1 8.1  NEUTROABS 4,082 3,110 4.0  HGB 13.6 12.9 13.8  HCT 45.5* 43.6 44.1  MCV 73.3* 74.3* 70.7*  PLT 281 281 230   Lipid Panel: Recent Labs    04/25/23 0853 10/17/23 0907 11/05/23 0925  CHOL 157 122 127  HDL 49* 53 49*  LDLCALC 92 55 63  TRIG 72 62 66  CHOLHDL 3.2 2.3 2.6   TSH: No results for input(s): TSH in the last 8760 hours. A1C: Lab Results  Component Value Date   HGBA1C 7.1 (H) 01/09/2023     Assessment/Plan 1. Controlled type  2 diabetes mellitus with hyperglycemia, without long-term current use of insulin  (HCC) (Primary) - followed by endocrinology - no recent hypoglycemia - A1c 5.9 09/26/2022 - cont metformin, Jardiance , Ozempic - Blood Glucose Monitoring Suppl (ACCU-CHEK GUIDE ME) w/Device KIT; 1 kit by Does not apply route as directed. Check blood sugars once daily Dx code E11.65  Dispense: 1 kit; Refill: 0 - Accu-Chek Softclix Lancets lancets; Check blood sugars once daily Dx code E10.9  Dispense: 100 each; Refill: 12  2. Nail discoloration - involves left hand 3rd digit - admits to use of acrylic nails - ? Fungal versus malignancy - if fungal do not recommend treatment with antifungal due to elevated liver enzymes - Ambulatory referral to Dermatology  3. MDD (major depressive disorder), single episode, moderate (HCC) - followed by psych - cont counseling  - cont Seroquel  and Zoloft   4. Elevated liver enzymes - followed by [facility]> last noted requested> ? elevation due to antibiotic use  - recent AST/ALT normal 08/04 - Crestor  discontinued  - repeat lipid panel and CMP> if normal restart Crestor  30 mg daily   5. Breast pain, right - exam unremarkable - recent mammogram negative for malignancy - ? Muscle strain  Total time: 34 minutes. Greater than 50% of total time spent doing patient education regarding nail discoloration, breast mass, elevated liver enzymes, statin use and depression including symptoms/medication management.     Next appt: 03/05/2024  [name] BODILY  [facility] [phone]

## 2023-12-31 ENCOUNTER — Inpatient Hospital Stay: Admitting: Neurology

## 2024-01-01 ENCOUNTER — Telehealth: Payer: Self-pay

## 2024-01-01 NOTE — Telephone Encounter (Signed)
 Message routed to PCP [name] BRAVO, NP . Please advise.

## 2024-01-01 NOTE — Telephone Encounter (Signed)
 Discussed options with patient. She is going to call GI specialist with [facility].

## 2024-01-01 NOTE — Telephone Encounter (Signed)
 Noted

## 2024-01-01 NOTE — Telephone Encounter (Signed)
 Copied from CRM #[id number]. Topic: Clinical - Medical Advice >> Jan 01, 2024 12:09 PM Diannia H wrote: Reason for CRM: Patient is wanted the provider to call her because she is needing to be referred to another doctor. She said she doesn't really know how to explain it. She said she can't use the restroom on her own but when she lays down she use the restroom on her self. She is not sure how because when she sits down she can't use it. Could you assist? Patients callback number is [phone]. Sometimes during the day she use it on her self as well but she is not feeling it when she does it.

## 2024-01-02 ENCOUNTER — Ambulatory Visit (INDEPENDENT_AMBULATORY_CARE_PROVIDER_SITE_OTHER): Admitting: Licensed Clinical Social Worker

## 2024-01-02 DIAGNOSIS — F321 Major depressive disorder, single episode, moderate: Secondary | ICD-10-CM | POA: Diagnosis not present

## 2024-01-02 DIAGNOSIS — R4189 Other symptoms and signs involving cognitive functions and awareness: Secondary | ICD-10-CM | POA: Diagnosis not present

## 2024-01-02 DIAGNOSIS — F1290 Cannabis use, unspecified, uncomplicated: Secondary | ICD-10-CM

## 2024-01-02 DIAGNOSIS — R29818 Other symptoms and signs involving the nervous system: Secondary | ICD-10-CM | POA: Diagnosis not present

## 2024-01-02 NOTE — Progress Notes (Signed)
 THERAPIST PROGRESS NOTE   Session Date: 01/02/2024  Session Time: 0920 - 1007  Participation Level: Active  MSE/Presentation: Behavior: Appropriate and Sharing Speech: Normal Thought Process: Coherent and Relevant Cognition: Appropriate and Oriented Mood: Anxious and Euthymic Affect: Congruent Insight: Improving Appearance: Casual  Type of Therapy: Individual Therapy  Treatment Goals addressed:   Initial (8) LTG: Manasvi Lady L will reduce the amount of anger-related incidents/outbursts by 25% as evidenced by self-report (Anger Management) STG: [name] will identify situations, thoughts, and feelings that trigger internal anger, and/or angry/aggressive actions as evidenced by self-report (Anger Management) LTG: I want to be able to be around people, learn to talk to someone without being angry and aggressive towards them (Anger Management) LTG: Reduce frequency, intensity, and duration of depression symptoms so that daily functioning is improved (OP Depression) LTG: Increase coping skills to manage depression and improve ability to perform daily activities (OP Depression) STG: Reduce overall depression score by a minimum of 25% on the Patient Health Questionnaire (PHQ-9) (OP Depression) STG: [name] will identify cognitive patterns and beliefs that support depression (OP Depression) STG: Andreya Lady L will reduce frequency of avoidant behaviors by 50% as evidenced by self-report in therapy sessions (OP Depression)  Progress Towards Goals: Not Progressing  Interventions: CBT, Motivational Interviewing, Solution Focused, and Supportive  Summary: KERRIANNE JENG is a 48 y.o. female with psych history of MDD, Cannabis Use D/O, Neurocognitive deficits, presenting for follow-up therapy session in efforts to improve management of presenting stressors and symptoms.  Patient openly engaged in session, presenting in overall pleasant moods, and congruent affect,  with brief periods of frustration and tearfulness throughout duration of visit. Pt engaged in check-in, sharing of experiencing increased knee pain in Rt knee, and continued stress and worries surrounding undergoing knee replacement surgery, considering postponing procedure again. Pt further engaged in recounts of events of the past week, sharing of having not attended [facility] in order to explore availability of group program to provide greater support of presenting sxs, availability of disability support services, and potential support in exploration of employment support services. Pt shared of reluctance and concern surrounding uncertainty of potential outcomes of inquiring about additional services, processing thoughts and determining individual interest in reconnecting with disability services with whom pt was previously referred to via neurologist approx. 1-2 yr ago. Pt engaged in processing anxiousness surrounding exploration of additional services, challenging anxious and irrational thoughts.      12/19/2023   10:56 AM 12/04/2023    8:22 AM 08/01/2023    3:47 PM 05/23/2023    3:18 PM  GAD 7 : Generalized Anxiety Score  Nervous, Anxious, on Edge 3 3 2 3   Control/stop worrying 3 3 2 3   Worry too much - different things 3 3 2 3   Trouble relaxing 1 0 1 3  Restless 3 1 1 3   Easily annoyed or irritable 3 3 3 3   Afraid - awful might happen 1 2 2 2   Total GAD 7 Score 17 15 13 20   Anxiety Difficulty Extremely difficult Very difficult  Very difficult      12/19/2023   10:59 AM 12/04/2023    8:28 AM 10/02/2023    9:34 AM 08/01/2023    3:50 PM 07/04/2023    3:24 PM  Depression screen PHQ 2/9  Decreased Interest 3 3 1 1 1   Down, Depressed, Hopeless 3 3 1 1 1   PHQ - 2 Score 6 6 2 2 2   Altered sleeping 1 0  1 0  Tired, decreased energy 3 2  0 1  Change in appetite 2 0  0 0  Feeling bad or failure about yourself  3 2  0 0  Trouble concentrating 3 3  3 3   Moving slowly or fidgety/restless 3 0  0 3   Suicidal thoughts 0 1  0 0  PHQ-9 Score 21 14  6 9   Difficult doing work/chores Extremely dIfficult Extremely dIfficult   Not difficult at all   Flowsheet Row ED from 12/24/2023 in [facility] Department at [facility] Counselor from 12/04/2023 in [facility] ED from 09/27/2023 in [facility] Emergency Department at [facility]  C-SSRS RISK CATEGORY No Risk Moderate Risk No Risk    Suicidal/Homicidal: Passive, No plan to harm self or others  Therapist Response: Clinician utilized CBT, MI, psychoed, and supportive reflection interventions to support patient in efforts to address presenting sxs and challenges surrounding presenting stressors.  Clinician openly greeted pt upon presenting for visit, assessing presenting moods and affect, engaging in check-in, utilizing open ended questions in eliciting pt recounts of events of the past week, newly identified stressors, ongoing stress surrounding employment and physical challenges, and individual perspectives surrounding management of challenges. Utilized active listening techniques to support pt recounts of events and challenges, providing support and validation of shared thoughts and feelings, aiding pt further in processing irrational thoughts and barriers to exploring additional community agency services that may prove beneficial to pt's presenting needs. Clinician reassessed severity of presenting sxs, and presence of any safety concerns. Patient continues to meet criteria for MDD, Cannabis Use D/O, Neurocognitive deficits. Patient will benefit from continued engagement in outpatient therapy while awaiting securing of enhanced services to better support pt's presenting needs.  Patient has not proved to be making measurable progress at this time.  Homework: None.  Plan: Return again in 1 weeks.  Diagnosis:  Encounter Diagnoses  Name Primary?   MDD (major depressive disorder),  single episode, moderate (HCC) Yes   Cannabis use disorder    Neurocognitive deficits        Collaboration of Care: Psychiatrist AEB provider documentation available in EHR.  Patient/Guardian was advised Release of Information must be obtained prior to any record release in order to collaborate their care with an outside provider. Patient/Guardian was advised if they have not already done so to contact the registration department to sign all necessary forms in order for us  to release information regarding their care.   Consent: Patient/Guardian gives verbal consent for treatment and assignment of benefits for services provided during this visit. Patient/Guardian expressed understanding and agreed to proceed.   Lynwood JONETTA Maris, MSW, LCSW 01/02/2024,  10:07 AM

## 2024-01-03 ENCOUNTER — Emergency Department (HOSPITAL_BASED_OUTPATIENT_CLINIC_OR_DEPARTMENT_OTHER)
Admission: EM | Admit: 2024-01-03 | Discharge: 2024-01-03 | Disposition: A | Discharge status: Home / Self Care | Attending: Emergency Medicine | Admitting: Emergency Medicine

## 2024-01-03 ENCOUNTER — Other Ambulatory Visit

## 2024-01-03 ENCOUNTER — Other Ambulatory Visit: Payer: Self-pay

## 2024-01-03 ENCOUNTER — Encounter (HOSPITAL_BASED_OUTPATIENT_CLINIC_OR_DEPARTMENT_OTHER): Payer: Self-pay | Admitting: Emergency Medicine

## 2024-01-03 ENCOUNTER — Emergency Department (HOSPITAL_BASED_OUTPATIENT_CLINIC_OR_DEPARTMENT_OTHER)

## 2024-01-03 DIAGNOSIS — M25572 Pain in left ankle and joints of left foot: Secondary | ICD-10-CM | POA: Diagnosis not present

## 2024-01-03 DIAGNOSIS — S0990XA Unspecified injury of head, initial encounter: Secondary | ICD-10-CM | POA: Diagnosis present

## 2024-01-03 DIAGNOSIS — W109XXA Fall (on) (from) unspecified stairs and steps, initial encounter: Secondary | ICD-10-CM | POA: Diagnosis not present

## 2024-01-03 DIAGNOSIS — W19XXXA Unspecified fall, initial encounter: Secondary | ICD-10-CM

## 2024-01-03 DIAGNOSIS — S8002XA Contusion of left knee, initial encounter: Secondary | ICD-10-CM | POA: Insufficient documentation

## 2024-01-03 DIAGNOSIS — R748 Abnormal levels of other serum enzymes: Secondary | ICD-10-CM

## 2024-01-03 DIAGNOSIS — M542 Cervicalgia: Secondary | ICD-10-CM | POA: Insufficient documentation

## 2024-01-03 DIAGNOSIS — Z79899 Other long term (current) drug therapy: Secondary | ICD-10-CM | POA: Insufficient documentation

## 2024-01-03 DIAGNOSIS — E119 Type 2 diabetes mellitus without complications: Secondary | ICD-10-CM | POA: Insufficient documentation

## 2024-01-03 DIAGNOSIS — I10 Essential (primary) hypertension: Secondary | ICD-10-CM | POA: Insufficient documentation

## 2024-01-03 DIAGNOSIS — M25561 Pain in right knee: Secondary | ICD-10-CM | POA: Insufficient documentation

## 2024-01-03 DIAGNOSIS — Z91040 Latex allergy status: Secondary | ICD-10-CM | POA: Diagnosis not present

## 2024-01-03 DIAGNOSIS — Z7901 Long term (current) use of anticoagulants: Secondary | ICD-10-CM | POA: Insufficient documentation

## 2024-01-03 DIAGNOSIS — E041 Nontoxic single thyroid nodule: Secondary | ICD-10-CM

## 2024-01-03 DIAGNOSIS — Z7984 Long term (current) use of oral hypoglycemic drugs: Secondary | ICD-10-CM | POA: Insufficient documentation

## 2024-01-03 MED ORDER — HYDROCODONE-ACETAMINOPHEN 5-325 MG PO TABS
1.0000 | ORAL_TABLET | Freq: Once | ORAL | Status: AC
  Administered 2024-01-03: 1 via ORAL
  Filled 2024-01-03: qty 1

## 2024-01-03 NOTE — Discharge Instructions (Addendum)
 The x-rays did not show any signs of acute fracture.  Follow-up with your orthopedic doctor to make sure your knee pain resolves.

## 2024-01-03 NOTE — ED Triage Notes (Signed)
 Pt reports falling down 4-5 stairs last night, fell forward.    C/o BL knee pain, L worse than R, and forehead pain.

## 2024-01-03 NOTE — ED Provider Notes (Signed)
 Court[facility] AT [facility] Provider Note   CSN: [id number] Arrival date & time: 01/03/24  1235     Patient presents with: Courtney Davies   Courtney Davies is a 48 y.o. female.    Fall     Patient has a history of chronic pericarditis symptomatic anemia acid reflux chronic back pain, hypertension, diabetes.  Patient presents ED for evaluation after a fall yesterday.  Patient states she was walking down stairs when she actually stumbled fell forward.  Patient states she hit her head and face.  She also having pain in her neck.  She is also having pain in both knees and the left ankle.  Patient states that hurts to ambulate.  Prior to Admission medications   Medication Sig Start Date End Date Taking? Authorizing Provider  ACCU-CHEK GUIDE TEST test strip 1 each by Other route daily. Check blood sugars daily. Dx code E10.9 12/09/23   [provider]  Accu-Chek Softclix Lancets lancets Check blood sugars once daily Dx code E10.9 12/26/23   [name] BRAVO, NP  albuterol  (VENTOLIN  HFA) 108 (90 Base) MCG/ACT inhaler Inhale 2 puffs into the lungs every 6 (six) hours as needed for wheezing or shortness of breath. 01/24/21   Love, Sharlet RAMAN, PA-C  alum & mag hydroxide-simeth (MAALOX MAX) 400-400-40 MG/5ML suspension Take 5 mLs by mouth every 6 (six) hours as needed for indigestion. 09/24/23   Pamella Ozell LABOR, DO  BD PEN NEEDLE NANO 2ND GEN 32G X 4 MM MISC 3 (three) times daily. as directed 07/11/21   [provider]  Blood Glucose Monitoring Suppl (ACCU-CHEK GUIDE ME) w/Device KIT 1 kit by Does not apply route as directed. Check blood sugars once daily Dx code E11.65 12/26/23   [name] E, NP  carvedilol  (COREG ) 6.25 MG tablet TAKE 1 TABLET(6.25 MG) BY MOUTH TWICE DAILY 11/14/23   [name], [name], NP  cetirizine  (ZYRTEC ) 10 MG tablet Take 1 tablet (10 mg total) by mouth daily. 11/28/23   [name], [name], NP  ELIQUIS  5 MG TABS tablet TAKE 1 TABLET(5 MG) BY MOUTH TWICE DAILY  09/23/23   [name], [name], NP  gabapentin  (NEURONTIN ) 100 MG capsule Take 200 mg by mouth at bedtime.    [provider]  hydrOXYzine  (ATARAX ) 10 MG tablet TAKE 1 TABLET(10 MG) BY MOUTH AT BEDTIME 11/18/23   [name], [name], NP  JARDIANCE  10 MG TABS tablet TAKE 1 TABLET(10 MG) BY MOUTH EVERY MORNING 12/17/23   [name], [name], NP  lamoTRIgine  (LAMICTAL ) 100 MG tablet Take 1/2 tab twice a day 11/28/23   Arfeen, Leni [name], MD  LINZESS  145 MCG CAPS capsule Take 145 mcg by mouth daily. 11/19/23   [provider]  losartan  (COZAAR ) 25 MG tablet TAKE 1 TABLET BY MOUTH DAILY 01/29/23   [name], [name], NP  metformin (FORTAMET) 500 MG (OSM) 24 hr tablet Take 500 mg by mouth in the morning and at bedtime.    [provider]  omeprazole (PRILOSEC) 40 MG capsule Take 40 mg by mouth 2 (two) times daily. 11/19/23   [provider]  OZEMPIC, 0.25 OR 0.5 MG/DOSE, 2 MG/3ML SOPN Inject 0.5 mg into the skin once a week. 01/06/23   [provider]  QUEtiapine  (SEROQUEL ) 100 MG tablet TAKE 1 TABLET(100 MG) BY MOUTH AT BEDTIME 09/04/23   Babs Arthea [name], MD  rivastigmine  (EXELON ) 3 MG capsule TAKE 1 CAPSULE(3 MG) BY MOUTH TWICE DAILY 08/09/23   Babs,  Arthea [name], MD  rosuvastatin  (CRESTOR ) 20 MG tablet Take 1.5 tablets (30 mg total) by mouth daily. 09/26/23   [name], [name], NP  sertraline  (ZOLOFT ) 100 MG tablet Take 1 tablet (100 mg total) by mouth daily. 11/28/23   Arfeen, Leni [name], MD    Allergies: Latex, Other, Codeine , and Nitrofurantoin    Review of Systems  Updated Vital Signs BP (!) 146/99 (BP Location: Right Arm)   Pulse 80   Temp 97.8 F (36.6 C)   Resp 16   Ht 1.676 m (5' 6)   Wt 82.1 kg   LMP 12/03/2012   SpO2 100%   BMI 29.21 kg/m   Physical Exam Vitals and nursing note reviewed.  Constitutional:      Appearance: She is well-developed. She is not diaphoretic.  HENT:     Head: Normocephalic.     Comments: No facial hematoma, no laceration no swelling noted    Right Ear:  External ear normal.     Left Ear: External ear normal.  Eyes:     General: No scleral icterus.       Right eye: No discharge.        Left eye: No discharge.     Conjunctiva/sclera: Conjunctivae normal.  Neck:     Trachea: No tracheal deviation.  Cardiovascular:     Rate and Rhythm: Normal rate and regular rhythm.  Pulmonary:     Effort: Pulmonary effort is normal. No respiratory distress.     Breath sounds: Normal breath sounds. No stridor. No wheezing or rales.  Abdominal:     General: Bowel sounds are normal. There is no distension.     Palpations: Abdomen is soft.     Tenderness: There is no abdominal tenderness. There is no guarding or rebound.  Musculoskeletal:        General: Swelling and tenderness present. No deformity.     Cervical back: Neck supple. Tenderness present.     Thoracic back: No tenderness.     Lumbar back: No tenderness.     Right hip: Normal.     Left hip: Normal.     Right knee: No swelling. Tenderness present.     Left knee: Swelling present. Tenderness present.     Right lower leg: Normal.     Left lower leg: Tenderness present. No swelling.  Skin:    General: Skin is warm and dry.     Findings: No rash.  Neurological:     General: No focal deficit present.     Mental Status: She is alert.     Cranial Nerves: No cranial nerve deficit, dysarthria or facial asymmetry.     Sensory: No sensory deficit.     Motor: No abnormal muscle tone or seizure activity.     Coordination: Coordination normal.  Psychiatric:        Mood and Affect: Mood normal.     (all labs ordered are listed, but only abnormal results are displayed) Labs Reviewed - No data to display  EKG: None  Radiology: CT Cervical Spine Wo Contrast Result Date: 01/03/2024 EXAM: CT CERVICAL SPINE WITHOUT CONTRAST 01/03/2024 02:07:00 PM TECHNIQUE: CT of the cervical spine was performed without the administration of intravenous contrast. Multiplanar reformatted images are provided for  review. Automated exposure control, iterative reconstruction, and/or weight based adjustment of the mA/kV was utilized to reduce the radiation dose to as low as reasonably achievable. COMPARISON: CT cervical spine 12/01/2020 CLINICAL HISTORY: Neck trauma, focal neuro deficit or paresthesia (Age  16-64y). Pt reports falling down 4-5 stairs last night, fell forward. C/o BL knee pain, L worse than R, and forehead pain. FINDINGS: CERVICAL SPINE: BONES AND ALIGNMENT: Similar straightening of the normal cervical lordosis. No traumatic malalignment. DEGENERATIVE CHANGES: Degenerative endplate osteophytes at multiple levels. SOFT TISSUES: Enlargement of the thyroid  gland with multiple nodules in the right thyroid  lobe. Consider nonemergent thyroid  ultrasound for further evaluation. IMPRESSION: 1. No acute abnormality of the cervical spine related to the reported neck trauma. 2. Prominence of the thyroid  with multiple nodules. Consider nonemergent thyroid  ultrasound for further evaluation. Electronically signed by: Donnice Mania MD 01/03/2024 02:38 PM EDT RP Workstation: [id number]   CT Maxillofacial Wo Contrast Result Date: 01/03/2024 EXAM: CT OF THE FACE WITHOUT CONTRAST 01/03/2024 02:07:00 PM TECHNIQUE: CT of the face was performed without the administration of intravenous contrast. Multiplanar reformatted images are provided for review. Automated exposure control, iterative reconstruction, and/or weight based adjustment of the mA/kV was utilized to reduce the radiation dose to as low as reasonably achievable. COMPARISON: None available. CLINICAL HISTORY: Facial trauma, blunt. Pt reports falling down 4-5 stairs last night, fell forward. C/o BL knee pain, L worse than R, and forehead pain. FINDINGS: FACIAL BONES: No acute maxillofacial fracture. No mandibular dislocation. No destructive osseous lesion. Chronically absent maxillary teeth. ORBITS: Globes are intact. No acute traumatic injury. No inflammatory change. SINUSES  AND MASTOIDS: No acute abnormality. SOFT TISSUES: No acute abnormality. IMPRESSION: 1. No acute maxillofacial fracture. Electronically signed by: Donnice Mania MD 01/03/2024 02:32 PM EDT RP Workstation: [id number]   DG Knee Complete 4 Views Left Result Date: 01/03/2024 EXAM: 4 VIEW(S) XRAY OF THE LEFT KNEE 01/03/2024 02:07:00 PM COMPARISON: 06/20/2023 CLINICAL HISTORY: Fall, pain. Pt reports falling down 4-5 stairs last night, fell forward. C/o BL knee pain, L worse than R, and forehead pain. FINDINGS: BONES AND JOINTS: No acute fracture. No focal osseous lesion. No joint dislocation. Moderate suprapatellar knee joint effusion. Tricompartmental degenerative changes with joint space narrowing and osteophyte formation. SOFT TISSUES: The soft tissues are unremarkable. IMPRESSION: 1. No radiographic evidence of acute fracture. No dislocation. 2. Moderate suprapatellar knee joint effusion. 3. Tricompartmental osteoarthritis with joint space narrowing and osteophyte formation. Electronically signed by: Donnice Mania MD 01/03/2024 02:27 PM EDT RP Workstation: [id number]   DG Knee Complete 4 Views Right Result Date: 01/03/2024 EXAM: 4 or more VIEW(S) XRAY OF THE KNEE 01/03/2024 02:07:00 PM COMPARISON: 12/14/2012 CLINICAL HISTORY: Fall, pain. Pt reports falling down 4-5 stairs last night, fell forward. C/o BL knee pain, L worse than R, and forehead pain. FINDINGS: BONES AND JOINTS: No acute fracture. No focal osseous lesion. No joint dislocation. Small suprapatellar knee joint effusion. Mild degenerative changes of lateral compartment with minimal joint space narrowing and small marginal osteophytes. SOFT TISSUES: The soft tissues are unremarkable. IMPRESSION: 1. No acute fracture or dislocation. 2. Small suprapatellar knee joint effusion. 3. Mild degenerative changes of the lateral compartment with minimal joint space narrowing and small marginal osteophytes. Electronically signed by: Donnice Mania MD 01/03/2024 02:25 PM  EDT RP Workstation: [id number]   DG Ankle Complete Left Result Date: 01/03/2024 EXAM: 3 or more VIEW(S) XRAY OF THE LEFT ANKLE 01/03/2024 02:07:00 PM CLINICAL HISTORY: Fall, pain. Pt reports falling down 4-5 stairs last night, fell forward. C/o BL knee pain, L worse than R, and forehead pain. COMPARISON: None available. FINDINGS: BONES AND JOINTS: No acute fracture. No focal osseous lesion. No joint dislocation. Small plantar calcaneal spur. SOFT TISSUES: The soft tissues are unremarkable. IMPRESSION: 1.  No acute osseous abnormality. Electronically signed by: Donnice Mania MD 01/03/2024 02:23 PM EDT RP Workstation: [id number]   CT Head Wo Contrast Result Date: 01/03/2024 EXAM: CT HEAD WITHOUT CONTRAST 01/03/2024 02:07:00 PM TECHNIQUE: CT of the head was performed without the administration of intravenous contrast. Automated exposure control, iterative reconstruction, and/or weight based adjustment of the mA/kV was utilized to reduce the radiation dose to as low as reasonably achievable. COMPARISON: MRI head 01/10/2023 and CT head 01/09/2023 CLINICAL HISTORY: Head trauma, moderate-severe. Pt reports falling down 4-5 stairs last night, fell forward. C/o BL knee pain, L worse than R, and forehead pain. FINDINGS: BRAIN AND VENTRICLES: No acute hemorrhage. No evidence of acute infarct. No hydrocephalus. No extra-axial collection. No mass effect or midline shift. ORBITS: No acute abnormality. SINUSES: No acute abnormality. SOFT TISSUES AND SKULL: No acute soft tissue abnormality. No skull fracture. IMPRESSION: 1. No acute intracranial abnormality. Electronically signed by: Donnice Mania MD 01/03/2024 02:21 PM EDT RP Workstation: [id number]     Procedures   Medications Ordered in the ED  HYDROcodone -acetaminophen  (NORCO/VICODIN) 5-325 MG per tablet 1 tablet (1 tablet Oral Given 01/03/24 1458)    Clinical Course as of 01/03/24 1500  Fri Jan 03, 2024  1444 CT of the C-spine does not show any signs of acute  fracture.  Patient does have thyroid  nodules [JK]  1445 CT head and facial CT without acute findings [JK]  1445 No evidence of fracture.  Tricompartmental arthritis, suprapatellar joint effusion noted [JK]    Clinical Course User Index [JK] Randol Simmonds, MD                                 Medical Decision Making Problems Addressed: Contusion of left knee, initial encounter: acute illness or injury that poses a threat to life or bodily functions Fall, initial encounter: acute illness or injury that poses a threat to life or bodily functions Injury of head, initial encounter: acute illness or injury that poses a threat to life or bodily functions  Amount and/or Complexity of Data Reviewed Radiology: ordered and independent interpretation performed.  Risk Prescription drug management.   Patient presented to the ED for evaluation after fall.  Patient is complaining of pain in her head neck and face.  She was also having knee and ankle pain.  ED workup does not show any signs of serious head injury.  No cervical spine fracture.  No facial bone fracture.  X-rays show arthritic changes in the knees and effusion but no signs of acute fracture.  Will provide crutches for comfort.  Patient was given a dose of pain meds.  Recommend outpatient follow-up with orthopedic doctor.  Evaluation and diagnostic testing in the emergency department does not suggest an emergent condition requiring admission or immediate intervention beyond what has been performed at this time.  The patient is safe for discharge and has been instructed to return immediately for worsening symptoms, change in symptoms or any other concerns.     Final diagnoses:  Fall, initial encounter  Contusion of left knee, initial encounter  Injury of head, initial encounter    ED Discharge Orders     None          Randol Simmonds, MD 01/03/24 1501

## 2024-01-03 NOTE — ED Notes (Addendum)
 Pt alert and oriented X 4 at the time of discharge. RR even and unlabored. No acute distress noted. Pt verbalized understanding of discharge instructions as discussed. Pt transported in wheelchair to lobby at time of discharge.

## 2024-01-06 ENCOUNTER — Telehealth: Payer: Self-pay | Admitting: Orthopedic Surgery

## 2024-01-06 ENCOUNTER — Other Ambulatory Visit: Payer: Self-pay | Admitting: Orthopedic Surgery

## 2024-01-06 DIAGNOSIS — M1712 Unilateral primary osteoarthritis, left knee: Secondary | ICD-10-CM

## 2024-01-06 DIAGNOSIS — E041 Nontoxic single thyroid nodule: Secondary | ICD-10-CM

## 2024-01-06 NOTE — Telephone Encounter (Signed)
 I have called patient and notified her. I also gave her number for [facility] [phone]. Patient states that she will call them right now and ask when she can have this completed. Message routed back to PCP [name] BRAVO, NP as RICK.

## 2024-01-06 NOTE — Telephone Encounter (Signed)
 Recent imaging of neck noted thyroid  nodules. Radiology recommended ultrasound of thyroid . Orders placed for thyroid  ultrasound at [facility].

## 2024-01-08 ENCOUNTER — Encounter: Payer: Self-pay | Admitting: Physical Medicine & Rehabilitation

## 2024-01-08 ENCOUNTER — Encounter (HOSPITAL_BASED_OUTPATIENT_CLINIC_OR_DEPARTMENT_OTHER): Admitting: Physical Medicine & Rehabilitation

## 2024-01-08 ENCOUNTER — Ambulatory Visit (INDEPENDENT_AMBULATORY_CARE_PROVIDER_SITE_OTHER): Admitting: Licensed Clinical Social Worker

## 2024-01-08 VITALS — BP 143/88 | HR 67 | Ht 66.0 in | Wt 196.0 lb

## 2024-01-08 DIAGNOSIS — F321 Major depressive disorder, single episode, moderate: Secondary | ICD-10-CM | POA: Diagnosis present

## 2024-01-08 DIAGNOSIS — G931 Anoxic brain damage, not elsewhere classified: Secondary | ICD-10-CM | POA: Diagnosis present

## 2024-01-08 DIAGNOSIS — F1290 Cannabis use, unspecified, uncomplicated: Secondary | ICD-10-CM

## 2024-01-08 DIAGNOSIS — G479 Sleep disorder, unspecified: Secondary | ICD-10-CM | POA: Diagnosis present

## 2024-01-08 DIAGNOSIS — R4189 Other symptoms and signs involving cognitive functions and awareness: Secondary | ICD-10-CM

## 2024-01-08 DIAGNOSIS — R29818 Other symptoms and signs involving the nervous system: Secondary | ICD-10-CM

## 2024-01-08 DIAGNOSIS — R413 Other amnesia: Secondary | ICD-10-CM

## 2024-01-08 MED ORDER — LAMOTRIGINE 100 MG PO TABS
ORAL_TABLET | ORAL | 1 refills | Status: DC
Start: 2024-01-08 — End: 2024-02-28

## 2024-01-08 MED ORDER — QUETIAPINE FUMARATE 100 MG PO TABS: 150.0000 mg | ORAL_TABLET | Freq: Every day | ORAL | 6 refills | Status: DC

## 2024-01-08 NOTE — Progress Notes (Signed)
 Subjective:    Patient ID: Courtney Davies, female    DOB: February 13, 1976, 48 y.o.   MRN: [id number]  HPI  Courtney Davies is here in follow up of her ABI. She walked out of her job because she was angry with her manager and how she was being treated.  She feels that her mood swing are more severe even with friends and family. She is cussing people out. A very little problem or comment can set her off.   It looks as if she is also being followed by psychiatry at this point.  Lamictal  was started in August.  She does not seem to be aware that she is actually on the medication.  She still taking Seroquel  100 mg at bedtime with her melatonin.  She also is on Zoloft  and we have her on gabapentin  for neuropathic pain.  Her sleep is inconsistent.  Sounds that she gets anywhere from 4 to 6 hours at night most the time.  Her recent review was difficult due to her short attention span and quickly changing gears this morning.   Pain Inventory Average Pain 10 Pain Right Now 10 My pain is constant, sharp, burning, dull, stabbing, tingling, and aching  In the last 24 hours, has pain interfered with the following? General activity 10 Relation with others 10 Enjoyment of life 10 What TIME of day is your pain at its worst? morning , daytime, evening, and night Sleep (in general) Fair  Pain is worse with: walking, bending, sitting, standing, and some activites Pain improves with: N/A Relief from Meds: N/A  Family History  Problem Relation Age of Onset   Stroke Mother    Diabetes Mother    Diabetes Father    Stroke Maternal Grandmother    Diabetes Maternal Grandmother    Anemia Paternal Grandmother    Valvular heart disease Paternal Grandmother    Hypertension Mother    Prostate cancer Maternal Uncle        ? intestinal also   Social History   Socioeconomic History   Marital status: Single    Spouse name: Not on file   Number of children: 2   Years of education: 11th   Highest education  level: Not on file  Occupational History   Occupation: salad maker in restaurant  Tobacco Use   Smoking status: Former    Types: Cigarettes   Smokeless tobacco: Never  Vaping Use   Vaping status: Never Used  Substance and Sexual Activity   Alcohol use: Not Currently    Comment: occ   Drug use: Yes    Types: Marijuana    Comment: daily   Sexual activity: Not Currently    Birth control/protection: Surgical  Other Topics Concern   Not on file  Social History Narrative   ** Merged History Encounter **       Lives at home with her two children. Occasional caffeine use. Right-handed.   Social Drivers of Corporate investment banker Strain: High Risk (08/02/2023)   Overall Financial Resource Strain (CARDIA)    Difficulty of Paying Living Expenses: Very hard  Food Insecurity: Medium Risk (11/18/2023)   Received from [facility]   Hunger Vital Sign    Within the past 12 months, you worried that your food would run out before you got money to buy more: Never true    Within the past 12 months, the food you bought just didn't last and you didn't have money to get more. : Sometimes  true  Transportation Needs: No Transportation Needs (11/18/2023)   [facility] from [facility]    In the past 12 months, has lack of reliable transportation kept you from medical appointments, meetings, work or from getting things needed for daily living? : No  Physical Activity: Not on file  Stress: Stress Concern Present (05/15/2023)   [employer] of Occupational Health - Occupational Stress Questionnaire    Feeling of Stress : To some extent  Social Connections: Unknown (07/04/2023)   Social Connection and Isolation Panel    Frequency of Communication with Friends and Family: Never    Frequency of Social Gatherings with Friends and Family: Never    Attends Religious Services: 1 to 4 times per year    Active Member of [employer] or Organizations: No    Attends Hospital doctor: Never    Marital Status: Patient declined   Past Surgical History:  Procedure Laterality Date   ABDOMINAL HYSTERECTOMY  YRS AGO   COMPLETE   CESAREAN SECTION  1998; 2002   ESOPHAGEAL DILATION  02/2020   by Dr Marvis   INGUINAL HERNIA REPAIR Bilateral 1980s    total of 4 surgeries (04/23/2017)   IR GASTROSTOMY TUBE MOD SED  12/13/2020   KNEE ARTHROSCOPY WITH MEDIAL MENISECTOMY Left 01/30/2018   Procedure: LEFT KNEE ARTHROSCOPY WITH PARTIAL LATERAL MENISCECTOMY;  Surgeon: Vernetta Lonni GRADE, MD;  Location: [facility];  Service: Orthopedics;  Laterality: Left;   KNEE CARTILAGE SURGERY Left    SUBQ ICD IMPLANT N/A 09/13/2021   Procedure: SUBQ ICD IMPLANT;  Surgeon: [name], MD;  Location: [facility] INVASIVE CV LAB;  Service: Cardiovascular;  Laterality: N/A;   TUBAL LIGATION  2002   Past Surgical History:  Procedure Laterality Date   ABDOMINAL HYSTERECTOMY  YRS AGO   COMPLETE   CESAREAN SECTION  1998; 2002   ESOPHAGEAL DILATION  02/2020   by Dr Marvis FONTANA HERNIA REPAIR Bilateral 1980s    total of 4 surgeries (04/23/2017)   IR GASTROSTOMY TUBE MOD SED  12/13/2020   KNEE ARTHROSCOPY WITH MEDIAL MENISECTOMY Left 01/30/2018   Procedure: LEFT KNEE ARTHROSCOPY WITH PARTIAL LATERAL MENISCECTOMY;  Surgeon: Vernetta Lonni GRADE, MD;  Location: [facility];  Service: Orthopedics;  Laterality: Left;   KNEE CARTILAGE SURGERY Left    SUBQ ICD IMPLANT N/A 09/13/2021   Procedure: SUBQ ICD IMPLANT;  Surgeon: [name], MD;  Location: [facility] INVASIVE CV LAB;  Service: Cardiovascular;  Laterality: N/A;   TUBAL LIGATION  2002   Past Medical History:  Diagnosis Date   Anemia 09/2015   Anoxic brain injury (HCC)    Arthritis    knees (04/23/2017)   Asthma    teens; went away; came back (04/23/2017)   B12 deficiency anemia 04/23/2017   Cardiac arrest (HCC)    Chronic bronchitis (HCC)    Chronic low back pain with sciatica    Chronic lower back pain    Diabetes  mellitus without complication (HCC)    Diabetes type 2, controlled (HCC)    Elevated ferritin level    Fatty liver    GERD (gastroesophageal reflux disease)    GERD with stricture    Headache    1-2/wk (04/23/2017)   History of blood transfusion plenty   related to anemia (04/23/2017)   Hypertension    Inguinal hernia    Low back pain    Migraine    1-2/month (04/23/2017)   OA (osteoarthritis) of knee--left  Paroxysmal atrial fibrillation (HCC)    Sub-clinical -seen on device?, elevated CHADSVASC   Symptomatic anemia 04/23/2017   Vitamin B 12 deficiency    BP (!) 143/88 (BP Location: Left Arm, Patient Position: Sitting, Cuff Size: Normal)   Pulse 67   Ht 5' 6 (1.676 m)   Wt 196 lb (88.9 kg)   LMP 12/03/2012   SpO2 99%   BMI 31.64 kg/m   Opioid Risk Score:   Fall Risk Score:  `1  Depression screen [facility] 2/9     01/08/2024    9:56 AM 12/19/2023   10:59 AM 12/04/2023    8:28 AM 10/02/2023    9:34 AM 08/01/2023    3:50 PM 07/04/2023    3:24 PM 06/13/2023    3:08 PM  Depression screen PHQ 2/9  Decreased Interest 0   1 1 1 1   Down, Depressed, Hopeless 0   1 1 1 1   PHQ - 2 Score 0   2 2 2 2   Altered sleeping     1 0 0  Tired, decreased energy     0 1 1  Change in appetite     0 0 0  Feeling bad or failure about yourself      0 0 0  Trouble concentrating     3 3 0  Moving slowly or fidgety/restless     0 3 0  Suicidal thoughts     0 0 0  PHQ-9 Score     6 9 3   Difficult doing work/chores      Not difficult at all Not difficult at all     Information is confidential and restricted. Go to Review Flowsheets to unlock data.      Review of Systems  Musculoskeletal:  Positive for back pain.       Bilateral knee pain, low back pain, bilateral hand pain  All other systems reviewed and are negative.      Objective:   Physical Exam General: No acute distress HEENT: NCAT, EOMI, oral membranes moist Cards: reg rate  Chest: normal effort Abdomen: Soft, NT, ND Skin:  dry, intact Extremities: no edema Psych: impulsive Skin: intact Neuro: poor attention, less than a minute, impulsive, impaired short term memory. Normal language and speech. Cranial nerve exam unremarkable. MMT:   Motor 5/5 in all 4's. Sensory normal.  Musculoskeletal: antalgic LLE--KNEE unchanged   Assessment & Plan:  1.  Decline in ADL, mobility , and cognition secondary to anoxic brain injury -revisit VR at some point -doing ok with driving -stop exelon  2. Left knee pain: TKA this Fall potentially             voltaren  gel prn.  4. Mood/sleep:               - increase seroquel  150mg  at night for sleep/mood stability             --continue zoloft  for mood.   -church involvement encouraged -increase lamictal  to 100mg  bid -psychiatry follow up-== will reach out to psych as well -neuropsych referral 5. T2DM per primary?: 6. HTN: 11. SVT/NSVT: On coreg  and amiodarone  bid for rate control.               -s/p ICD/pacer              -continue per cardiology.    20 minutes of face to face patient care time were spent during this visit. All questions were encouraged and answered.  Follow up  with me in 2 mos. I encouraged her to call me if any issues prior to her follow-up visit.

## 2024-01-08 NOTE — Patient Instructions (Addendum)
 ALWAYS FEEL FREE TO CALL OUR OFFICE WITH ANY PROBLEMS OR QUESTIONS [phone])  **PLEASE NOTE** ALL MEDICATION REFILL REQUESTS (INCLUDING CONTROLLED SUBSTANCES) NEED TO BE MADE AT LEAST 7 DAYS PRIOR TO REFILL BEING DUE. ANY REFILL REQUESTS INSIDE THAT TIME FRAME MAY RESULT IN DELAYS IN RECEIVING YOUR PRESCRIPTION.     SPEAK WITH DR. ARFINE ABOUT YOUR MEDICATIONS TOMORROW.  HE IS WELCOME TO CALL ME.                e

## 2024-01-08 NOTE — Progress Notes (Signed)
 THERAPIST PROGRESS NOTE   Session Date: 01/08/2024  Session Time: 1310 - 1415  Participation Level: Active  MSE/Presentation: Behavior: Appropriate and Sharing Speech: Normal Thought Process: Coherent and Relevant Cognition: Appropriate and Oriented Mood: Anxious and Euthymic Affect: Congruent Insight: Improving Appearance: Casual  Type of Therapy: Individual Therapy  Treatment Goals addressed:   Initial (8) LTG: Courtney Davies will reduce the amount of anger-related incidents/outbursts by 25% as evidenced by self-report (Anger Management) STG: Courtney Davies will identify situations, thoughts, and feelings that trigger internal anger, and/or angry/aggressive actions as evidenced by self-report (Anger Management) LTG: I want to be able to be around people, learn to talk to someone without being angry and aggressive towards them (Anger Management) LTG: Reduce frequency, intensity, and duration of depression symptoms so that daily functioning is improved (OP Depression) LTG: Increase coping skills to manage depression and improve ability to perform daily activities (OP Depression) STG: Reduce overall depression score by a minimum of 25% on the Patient Health Questionnaire (PHQ-9) (OP Depression) STG: Courtney Davies will identify cognitive patterns and beliefs that support depression (OP Depression) STG: Courtney Davies will reduce frequency of avoidant behaviors by 50% as evidenced by self-report in therapy sessions (OP Depression)  Progress Towards Goals: Not Progressing  Interventions: CBT, Motivational Interviewing, Solution Focused, and Supportive  Summary: Courtney Davies is a 48 y.o. female with psych history of MDD, Cannabis Use D/O, Neurocognitive deficits, presenting for follow-up therapy session in efforts to improve management of presenting stressors and symptoms.  Patient openly engaged in session, presenting in overall pleasant moods, and congruent affect,  with brief periods of increased anxiousness and irritability throughout duration of visit. Pt engaged in check-in, sharing it's okay, sharing of having attended rehabilitation appt earlier today, having been referred to Neuropsychology for further support as well as adding Lamictal  100 mg BID, and Seroquel  150 mg at bedtime to f/u with Arfeen, MD at med man visit on 9/25. Shared of having received notification of home health having been approved for up to 80 hr/mo, across 7 days/week, sharing of feeling that additional support will be beneficial for her to navigate challenges pt has in the home and with keeping up with appointments. Pt expressed experiencing chronic difficulties with memory relating to Anoxic Brain Injury, causing to impact overall moods, attitude, and behaviors towards others, finding self isolating on days when not wanting to interact with anyone, as well as being aware of difficulties regularly repeating self, getting lost, or not knowing certain things. Pt expressed having been informed of the importance of increasing sleep to support in the improvement of brain functioning, with chronic stress proving to cause increased difficulties surrounding sleep. Engaged in further exploration of previously discussed community supports via [facility] to include employment support services, and additional community resources, confirming location and time for pt to present to organization for intake appt.     12/19/2023   10:56 AM 12/04/2023    8:22 AM 08/01/2023    3:47 PM 05/23/2023    3:18 PM  GAD 7 : Generalized Anxiety Score  Nervous, Anxious, on Edge 3 3 2 3   Control/stop worrying 3 3 2 3   Worry too much - different things 3 3 2 3   Trouble relaxing 1 0 1 3  Restless 3 1 1 3   Easily annoyed or irritable 3 3 3 3   Afraid - awful might happen 1 2 2 2   Total GAD 7 Score 17 15 13 20   Anxiety Difficulty Extremely  difficult Very difficult  Very difficult      01/08/2024    9:56 AM 12/19/2023   10:59 AM  12/04/2023    8:28 AM 10/02/2023    9:34 AM 08/01/2023    3:50 PM  Depression screen PHQ 2/9  Decreased Interest 0 3 3 1 1   Down, Depressed, Hopeless 0 3 3 1 1   PHQ - 2 Score 0 6 6 2 2   Altered sleeping  1 0  1  Tired, decreased energy  3 2  0  Change in appetite  2 0  0  Feeling bad or failure about yourself   3 2  0  Trouble concentrating  3 3  3   Moving slowly or fidgety/restless  3 0  0  Suicidal thoughts  0 1  0  PHQ-9 Score  21 14  6   Difficult doing work/chores  Extremely dIfficult Extremely dIfficult     Flowsheet Row ED from 01/03/2024 in [facility] Emergency Department at [facility] ED from 12/24/2023 in [facility] Emergency Department at [facility] Counselor from 12/04/2023 in [facility] at [facility] RISK CATEGORY No Risk No Risk Moderate Risk    Suicidal/Homicidal: Passive, No plan to harm self or others  Therapist Response: Clinician utilized CBT, MI, psychoed, and supportive reflection interventions to support patient in efforts to address presenting sxs and challenges surrounding presenting stressors.  Clinician openly greeted pt upon presenting for visit, assessing presenting moods and affect, actively engaging in check-in, utilizing open ended questions in evoking recounts of events of the past week, ongoing challenges, and individual efforts at navigating stressors. Utilized active listening techniques to support pt through recounts of events, providing support and validation of shared feelings surrounding stressors, aiding in exploring individual perspectives of presenting concerns, and factors proving to impact pt's overall outlook. Explored pt's interest and efforts at seeking additional supports through community organization, providing support in processing details and availability of resources.   [x]  Cognitive Challenging  [x]  Cognitive Refocusing  []  Cognitive Reframing  []  Communication Skills  []  Compliance  Issues []  DBT  []  Exploration of Coping Patterns  []  Exploration of Emotions  []  Exploration of Relationship Patterns  []  Guided Imagery []  Interactive Feedback  []  Interpersonal Resolutions  []  Mindfulness Training  [x]  Preventative Services  [x]  Psycho-Education []  Relaxation/Deep Breathing  []  Review of Treatment Plan/Progress  []  Role-Play/Behavioral Rehearsal  [x]  Structured Problem Solving [x]  Supportive Reflection  []  Symptom Management  []  Other   Clinician reassessed severity of presenting sxs, and presence of any safety concerns. Patient continues to meet criteria for MDD, Cannabis Use D/O, Neurocognitive deficits. Patient will benefit from continued engagement in outpatient therapy while pursuing additional/enhanced services to better support pt's presenting needs.  Patient has not proved to be making measurable progress at this time.  Homework: None.  Plan: Return again in 1 weeks.  Diagnosis:  Encounter Diagnoses  Name Primary?   MDD (major depressive disorder), single episode, moderate (HCC) Yes   Cannabis use disorder    Neurocognitive deficits    Memory impairment     Collaboration of Care: Psychiatrist AEB provider documentation available in EHR. and Other provider involved in patient's care AEB referred to Neuropsychology for further evaluation and support.  Patient/Guardian was advised Release of Information must be obtained prior to any record release in order to collaborate their care with an outside provider. Patient/Guardian was advised if they have not already done so to contact  the registration department to sign all necessary forms in order for us  to release information regarding their care.   Consent: Patient/Guardian gives verbal consent for treatment and assignment of benefits for services provided during this visit. Patient/Guardian expressed understanding and agreed to proceed.   Lynwood JONETTA Maris, MSW, LCSW 01/08/2024,  1:13 PM

## 2024-01-09 ENCOUNTER — Ambulatory Visit (HOSPITAL_BASED_OUTPATIENT_CLINIC_OR_DEPARTMENT_OTHER): Admitting: Psychiatry

## 2024-01-09 ENCOUNTER — Encounter (HOSPITAL_COMMUNITY): Payer: Self-pay | Admitting: Psychiatry

## 2024-01-09 VITALS — Wt 196.0 lb

## 2024-01-09 DIAGNOSIS — R413 Other amnesia: Secondary | ICD-10-CM

## 2024-01-09 DIAGNOSIS — F321 Major depressive disorder, single episode, moderate: Secondary | ICD-10-CM | POA: Diagnosis not present

## 2024-01-09 DIAGNOSIS — R4586 Emotional lability: Secondary | ICD-10-CM | POA: Diagnosis not present

## 2024-01-09 DIAGNOSIS — G931 Anoxic brain damage, not elsewhere classified: Secondary | ICD-10-CM | POA: Diagnosis not present

## 2024-01-09 DIAGNOSIS — G479 Sleep disorder, unspecified: Secondary | ICD-10-CM | POA: Diagnosis not present

## 2024-01-09 MED ORDER — QUETIAPINE FUMARATE 150 MG PO TABS: 150.0000 mg | ORAL_TABLET | Freq: Every day | ORAL | 1 refills | Status: DC

## 2024-01-09 MED ORDER — SERTRALINE HCL 100 MG PO TABS: 100.0000 mg | ORAL_TABLET | Freq: Every day | ORAL | 1 refills | Status: DC

## 2024-01-09 NOTE — Progress Notes (Signed)
 [facility] MD/PA/NP OP Progress Note  01/09/2024 11:42 AM Courtney Davies  MRN:  [id number]  Chief Complaint:  Chief Complaint  Patient presents with   Follow-up   Medication Refill   HPI: Patient came today for her follow-up appointment.  Patient remains very labile, irritable and emotional.  She reported medicine not working and she does not leave the house.  She reported everyone pick on her and she gets very upset and emotional.  She started therapy with [facility] but did not feel it helped and actually made her more irritable.  She did not provide more details but like to try a different therapist.  Patient was seen by Dr. Arlana, her physical rehab physician yesterday and her medicine increased.  Patient told that she had a hard time dealing her daily routine.  She is not sleeping well and cursing people out.  She is only sleeping 4 to 5 hours.  She only talks to her mother as believe her family members and friends make fun of her.  She had a lot of issues with remembering things.  When asked about the medication, she reply not sure because she is taking 18 pills a day.  Her ADLs are effecting and sometimes she does not take shower, cook and remain isolated and withdrawn.  To do the session she was very emotional and tearful.  She denies any active or passive suicidal thoughts or homicidal thoughts.  She is now in process of getting home health aide.  She has assessment done and now hoping to have 2-1/2-hour a day get help so she can get help for her ADLs.  She does drive but usually when she has a GPS.  She admitted tends to forget the directions with the GPS helps.  She is not sure what medicine she is taking but she knows that she is taking 18 pills.  Her mother organizes the pills.  Patient was seen in the emergency room after she had a fall.  She complained of chronic pain.  Patient is still not able to find a new job.  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  She reported paranoia but denies any hallucination.  Patient has multiple health issues including numbness, tingling, headaches, chronic pain.  Patient does not want to talk about her cannabis use because in the past she told it keep her calm.  Her son keeps and helps finances and mother helps the medication box.  Visit Diagnosis:    ICD-10-CM   1. MDD (major depressive disorder), single episode, moderate (HCC)  F32.1 sertraline  (ZOLOFT ) 100 MG tablet    QUEtiapine  150 MG TABS    2. Anoxic brain injury (HCC)  G93.1 sertraline  (ZOLOFT ) 100 MG tablet    QUEtiapine  150 MG TABS    3. Sleep disorder  G47.9 sertraline  (ZOLOFT ) 100 MG tablet    QUEtiapine  150 MG TABS    4. Labile mood  R45.86 sertraline  (ZOLOFT ) 100 MG tablet    QUEtiapine  150 MG TABS    5. Memory impairment  R41.3 sertraline  (ZOLOFT ) 100 MG tablet       Past Psychiatric History: Reviewed  Past Medical History:  Past Medical History:  Diagnosis Date   Anemia 09/2015   Anoxic brain injury (HCC)    Arthritis    knees (04/23/2017)   Asthma    teens; went away; came back (04/23/2017)   B12 deficiency anemia 04/23/2017  Cardiac arrest (HCC)    Chronic bronchitis (HCC)    Chronic low back pain with sciatica    Chronic lower back pain    Diabetes mellitus without complication (HCC)    Diabetes type 2, controlled (HCC)    Elevated ferritin level    Fatty liver    GERD (gastroesophageal reflux disease)    GERD with stricture    Headache    1-2/wk (04/23/2017)   History of blood transfusion plenty   related to anemia (04/23/2017)   Hypertension    Inguinal hernia    Low back pain    Migraine    1-2/month (04/23/2017)   OA (osteoarthritis) of knee--left    Paroxysmal atrial fibrillation (HCC)    Sub-clinical -seen on device?, elevated CHADSVASC   Symptomatic anemia 04/23/2017   Vitamin B 12 deficiency     Past Surgical History:  Procedure Laterality Date   ABDOMINAL  HYSTERECTOMY  YRS AGO   COMPLETE   CESAREAN SECTION  1998; 2002   ESOPHAGEAL DILATION  02/2020   by Dr Marvis   INGUINAL HERNIA REPAIR Bilateral 1980s    total of 4 surgeries (04/23/2017)   IR GASTROSTOMY TUBE MOD SED  12/13/2020   KNEE ARTHROSCOPY WITH MEDIAL MENISECTOMY Left 01/30/2018   Procedure: LEFT KNEE ARTHROSCOPY WITH PARTIAL LATERAL MENISCECTOMY;  Surgeon: Vernetta Lonni GRADE, MD;  Location: [facility] ORS;  Service: Orthopedics;  Laterality: Left;   KNEE CARTILAGE SURGERY Left    SUBQ ICD IMPLANT N/A 09/13/2021   Procedure: SUBQ ICD IMPLANT;  Surgeon: [name], MD;  Location: [facility] INVASIVE CV LAB;  Service: Cardiovascular;  Laterality: N/A;   TUBAL LIGATION  2002    Family Psychiatric History: No history of psychiatric inpatient.  History of cutting wrist in 2022.  History of cardiac arrest resulting in neurocognitive impairment, impaired memory and emotional dysregulation.  As per EMR took Celexa , Depakote  in 2022 during hospitalization on the medical floor.  Saw once Dr Bronson and given Seroquel  and Zoloft .  Family History:  Family History  Problem Relation Age of Onset   Stroke Mother    Diabetes Mother    Diabetes Father    Stroke Maternal Grandmother    Diabetes Maternal Grandmother    Anemia Paternal Grandmother    Valvular heart disease Paternal Grandmother    Hypertension Mother    Prostate cancer Maternal Uncle        ? intestinal also    Social History:  Social History   Socioeconomic History   Marital status: Single    Spouse name: Not on file   Number of children: 2   Years of education: 11th   Highest education level: Not on file  Occupational History   Occupation: Scientist, product/process development in restaurant  Tobacco Use   Smoking status: Former    Types: Cigarettes   Smokeless tobacco: Never  Vaping Use   Vaping status: Never Used  Substance and Sexual Activity   Alcohol use: Not Currently    Comment: occ   Drug use: Yes    Types: Marijuana     Comment: daily   Sexual activity: Not Currently    Birth control/protection: Surgical  Other Topics Concern   Not on file  Social History Narrative   ** Merged History Encounter **       Lives at home with her two children. Occasional caffeine use. Right-handed.   Social Drivers of Health   Financial Resource Strain: High Risk (08/02/2023)   Overall Physicist, medical Strain (  CARDIA)    Difficulty of Paying Living Expenses: Very hard  Food Insecurity: Medium Risk (11/18/2023)   Received from [facility]   Hunger Vital Sign    Within the past 12 months, you worried that your food would run out before you got money to buy more: Never true    Within the past 12 months, the food you bought just didn't last and you didn't have money to get more. : Sometimes true  Transportation Needs: No Transportation Needs (11/18/2023)   Received from [facility]    In the past 12 months, has lack of reliable transportation kept you from medical appointments, meetings, work or from getting things needed for daily living? : No  Physical Activity: Not on file  Stress: Stress Concern Present (05/15/2023)   [employer] of Occupational Health - Occupational Stress Questionnaire    Feeling of Stress : To some extent  Social Connections: Unknown (07/04/2023)   Social Connection and Isolation Panel    Frequency of Communication with Friends and Family: Never    Frequency of Social Gatherings with Friends and Family: Never    Attends Religious Services: 1 to 4 times per year    Active Member of [employer] or Organizations: No    Attends Banker Meetings: Never    Marital Status: Patient declined    Allergies:  Allergies  Allergen Reactions   Latex Hives   Other Other (See Comments), Anaphylaxis, Swelling and Hives    Hair glue causes throat to close and hives glue Hair glue causes throat to close    Codeine  Nausea And Vomiting    Was on an empty stomach.    Nitrofurantoin Rash    Metabolic Disorder Labs: Lab Results  Component Value Date   HGBA1C 7.1 (H) 01/09/2023   MPG 157.07 01/09/2023   MPG 163 10/17/2022   No results found for: PROLACTIN Lab Results  Component Value Date   CHOL 127 11/05/2023   TRIG 66 11/05/2023   HDL 49 (L) 11/05/2023   CHOLHDL 2.6 11/05/2023   VLDL 17 01/09/2023   LDLCALC 63 11/05/2023   LDLCALC 55 10/17/2023   Lab Results  Component Value Date   TSH 0.44 12/06/2022   TSH 2.467 12/05/2020    Therapeutic Level Labs: No results found for: LITHIUM Lab Results  Component Value Date   VALPROATE 51 01/19/2021   VALPROATE 20 (L) 01/05/2021   No results found for: CBMZ  Current Medications: Current Outpatient Medications  Medication Sig Dispense Refill   ACCU-CHEK GUIDE TEST test strip 1 each by Other route daily. Check blood sugars daily. Dx code E10.9     Accu-Chek Softclix Lancets lancets Check blood sugars once daily Dx code E10.9 100 each 12   albuterol  (VENTOLIN  HFA) 108 (90 Base) MCG/ACT inhaler Inhale 2 puffs into the lungs every 6 (six) hours as needed for wheezing or shortness of breath. 18 g 2   alum & mag hydroxide-simeth (MAALOX MAX) 400-400-40 MG/5ML suspension Take 5 mLs by mouth every 6 (six) hours as needed for indigestion. 355 mL 0   BD PEN NEEDLE NANO 2ND GEN 32G X 4 MM MISC 3 (three) times daily. as directed     Blood Glucose Monitoring Suppl (ACCU-CHEK GUIDE ME) w/Device KIT 1 kit by Does not apply route as directed. Check blood sugars once daily Dx code E11.65 1 kit 0   carvedilol  (COREG ) 6.25 MG tablet TAKE 1 TABLET(6.25 MG) BY MOUTH TWICE DAILY 180 tablet 1  cetirizine  (ZYRTEC ) 10 MG tablet Take 1 tablet (10 mg total) by mouth daily. 30 tablet 11   ELIQUIS  5 MG TABS tablet TAKE 1 TABLET(5 MG) BY MOUTH TWICE DAILY 60 tablet 5   gabapentin  (NEURONTIN ) 100 MG capsule Take 200 mg by mouth at bedtime.     hydrOXYzine  (ATARAX ) 10 MG tablet TAKE 1 TABLET(10 MG) BY MOUTH AT BEDTIME  90 tablet 3   JARDIANCE  10 MG TABS tablet TAKE 1 TABLET(10 MG) BY MOUTH EVERY MORNING 90 tablet 1   lamoTRIgine  (LAMICTAL ) 100 MG tablet Take 1 tab twice a day 60 tablet 1   LINZESS  145 MCG CAPS capsule Take 145 mcg by mouth daily.     losartan  (COZAAR ) 25 MG tablet TAKE 1 TABLET BY MOUTH DAILY 90 tablet 3   metformin (FORTAMET) 500 MG (OSM) 24 hr tablet Take 500 mg by mouth in the morning and at bedtime.     omeprazole (PRILOSEC) 40 MG capsule Take 40 mg by mouth 2 (two) times daily.     OZEMPIC, 0.25 OR 0.5 MG/DOSE, 2 MG/3ML SOPN Inject 0.5 mg into the skin once a week.     QUEtiapine  (SEROQUEL ) 100 MG tablet Take 1.5 tablets (150 mg total) by mouth at bedtime. 45 tablet 6   rivastigmine  (EXELON ) 3 MG capsule TAKE 1 CAPSULE(3 MG) BY MOUTH TWICE DAILY 60 capsule 6   rosuvastatin  (CRESTOR ) 20 MG tablet Take 1.5 tablets (30 mg total) by mouth daily. 135 tablet 3   sertraline  (ZOLOFT ) 100 MG tablet Take 1 tablet (100 mg total) by mouth daily. 30 tablet 0   No current facility-administered medications for this visit.     Musculoskeletal: Strength & Muscle Tone: within normal limits Gait & Station: normal Patient leans: N/A  Psychiatric Specialty Exam: Review of Systems  Musculoskeletal:        Left leg pain  Neurological:  Positive for numbness and headaches.  Psychiatric/Behavioral:  Positive for decreased concentration and sleep disturbance. The patient is nervous/anxious and is hyperactive.     Last menstrual period 12/03/2012.There is no height or weight on file to calculate BMI.  General Appearance: Casual  Eye Contact:  Fair  Speech:  fast  Volume:  Increased  Mood:  Angry, Dysphoric, and Irritable  Affect:  Labile  Thought Process:  Descriptions of Associations: Circumstantial  Orientation:  Full (Time, Place, and Person)  Thought Content: Rumination   Suicidal Thoughts:  No  Homicidal Thoughts:  No  Memory:  Immediate;   Fair Recent;   Fair Remote;   Fair  Judgement:   Fair  Insight:  Shallow  Psychomotor Activity:  Increased and Restlessness  Concentration:  Concentration: Fair and Attention Span: Fair  Recall:  [employer] of Knowledge: Fair  Language: Good  Akathisia:  No  Handed:  Right  AIMS (if indicated): not done  Assets:  Communication Skills Desire for Improvement Housing Transportation  ADL's:  Intact  Cognition: Impaired,  Mild  Sleep:  Fair   Screenings: GAD-7    Advertising copywriter from 12/19/2023 in [facility] at [place] Counselor from 12/04/2023 in [facility] at [facility] Patient Outreach Telephone from 08/01/2023 in [place] [facility] DEPARTMENT Counselor from 05/23/2023 in [facility]  Total GAD-7 Score 17 15 13 20    Mini-Mental    Flowsheet Row Office Visit from 03/01/2022 in [facility]  Total Score (max 30 points ) 10  PHQ2-9    Flowsheet Row Office Visit from 01/08/2024 in [facility] Rehabilitation Counselor from 12/19/2023 in [facility] Outpatient Behavioral Health at [facility] from 12/04/2023 in [facility] Visit from 10/02/2023 in [facility] Rehabilitation Patient Outreach Telephone from 08/01/2023 in [place] POPULATION HEALTH DEPARTMENT  PHQ-2 Total Score 0 6 6 2 2   PHQ-9 Total Score -- 21 14 -- 6   Flowsheet Row ED from 01/03/2024 in [facility] Emergency Department at [facility] ED from 12/24/2023 in [facility] Emergency Department at [facility] Counselor from 12/04/2023 in [facility] Outpatient Behavioral Health at [facility] RISK CATEGORY No Risk No Risk Moderate Risk     Assessment and Plan: Patient is a 48 year old female with history of cardiac arrest status post anoxic brain injury, diabetes, hypertension, memory issues, sleep apnea,  cannabis use, anger issues, major depressive disorder and labile mood.  Reviewed blood work results, notes from other provider and emergency room visit.  Recently Seroquel  increased from 100 mg to 150 mg and Lamictal  100 mg twice a day.  She has not picked up the medication yet.  Her sleep is erratic.  She tried therapy but did not connect with the therapist.  She like to try a different therapy.  I encourage to pick up the medication which was recently called by primary care/rehab doctor.  Encouraged to see neurologist due to ongoing memory and neurocognitive deficits.  Continue Zoloft  100 mg daily.  We will provide therapy referral.  Encourage to watch carefully if she has any rash then need to stop the Lamictal .  Patient is hoping to get her home health aide to help with ADLs.  She has assessment and waiting for hours to start.  Discussed safety concerns at any time having active suicidal thoughts or homicidal and that she need to call 911 or go to local emergency room.  Follow-up in 2 months in person.   anoxic brain injury, status postcardiac arrest Collaboration of Care: Collaboration of Care: Other provider involved in patient's care AEB notes are available in epic to review  Patient/Guardian was advised Release of Information must be obtained prior to any record release in order to collaborate their care with an outside provider. Patient/Guardian was advised if they have not already done so to contact the registration department to sign all necessary forms in order for us  to release information regarding their care.   Consent: Patient/Guardian gives verbal consent for treatment and assignment of benefits for services provided during this visit. Patient/Guardian expressed understanding and agreed to proceed.    Leni ONEIDA Client, MD 01/09/2024, 11:42 AM

## 2024-01-10 ENCOUNTER — Ambulatory Visit (HOSPITAL_BASED_OUTPATIENT_CLINIC_OR_DEPARTMENT_OTHER)
Admission: RE | Admit: 2024-01-10 | Discharge: 2024-01-10 | Disposition: A | Discharge status: Home / Self Care | Source: Ambulatory Visit | Attending: Orthopedic Surgery | Admitting: Orthopedic Surgery

## 2024-01-10 DIAGNOSIS — E041 Nontoxic single thyroid nodule: Secondary | ICD-10-CM | POA: Diagnosis present

## 2024-01-14 ENCOUNTER — Ambulatory Visit: Payer: Self-pay | Admitting: Orthopedic Surgery

## 2024-01-15 ENCOUNTER — Ambulatory Visit (HOSPITAL_COMMUNITY): Admitting: Licensed Clinical Social Worker

## 2024-01-16 ENCOUNTER — Encounter

## 2024-01-21 ENCOUNTER — Encounter: Admitting: Physician Assistant

## 2024-01-21 ENCOUNTER — Other Ambulatory Visit: Payer: Self-pay | Admitting: Orthopedic Surgery

## 2024-01-21 DIAGNOSIS — I10 Essential (primary) hypertension: Secondary | ICD-10-CM

## 2024-01-22 ENCOUNTER — Other Ambulatory Visit: Payer: Self-pay | Admitting: Physician Assistant

## 2024-01-22 MED ORDER — DOCUSATE SODIUM 100 MG PO CAPS: 100.0000 mg | ORAL_CAPSULE | Freq: Every day | ORAL | 2 refills | Status: DC | PRN

## 2024-01-22 MED ORDER — DOXYCYCLINE HYCLATE 100 MG PO TABS: 100.0000 mg | ORAL_TABLET | Freq: Two times a day (BID) | ORAL | 0 refills | Status: DC

## 2024-01-22 MED ORDER — METHOCARBAMOL 750 MG PO TABS: 750.0000 mg | ORAL_TABLET | Freq: Three times a day (TID) | ORAL | 2 refills | Status: DC | PRN

## 2024-01-22 MED ORDER — OXYCODONE-ACETAMINOPHEN 5-325 MG PO TABS: 1.0000 | ORAL_TABLET | Freq: Four times a day (QID) | ORAL | 0 refills | Status: DC | PRN

## 2024-01-22 MED ORDER — ONDANSETRON HCL 4 MG PO TABS: 4.0000 mg | ORAL_TABLET | Freq: Three times a day (TID) | ORAL | 0 refills | Status: DC | PRN

## 2024-01-23 ENCOUNTER — Ambulatory Visit (HOSPITAL_COMMUNITY): Admitting: Licensed Clinical Social Worker

## 2024-01-23 ENCOUNTER — Encounter

## 2024-01-24 ENCOUNTER — Telehealth: Payer: Self-pay

## 2024-01-24 ENCOUNTER — Encounter (HOSPITAL_COMMUNITY): Payer: Self-pay | Admitting: Licensed Clinical Social Worker

## 2024-01-24 NOTE — Telephone Encounter (Signed)
 Approval letter received. See media.   I called patients pharmacy and left a detailed voicemail notifying them of prior authorization approval.

## 2024-01-24 NOTE — Telephone Encounter (Signed)
 Incoming fax received from [facility] (Prior Authorization Request Form for Rosuvastatin /Crestor ). 2 page form was completed, stamped with Amy's signature, and faxed along with last ov notes, recent [facility] labs, and last echocardiogram.  Waiting to hear from insurance company

## 2024-01-24 NOTE — Telephone Encounter (Signed)
 Copied from CRM [id number]. Topic: Clinical - Medication Prior Auth >> Jan 24, 2024 10:56 AM Alfonso ORN wrote: Reason for CRM: Jelie M calling from [employer]  prior authorization dept   have clinical guidline questions to finalize prior authorization  Callback number [phone]

## 2024-01-24 NOTE — Telephone Encounter (Signed)
 I returned call and was told not additional information was needed and prior shara was in review

## 2024-01-28 NOTE — Progress Notes (Signed)
 Surgical Instructions   Your procedure is scheduled on Monday, October 20th. Report to [facility] Main Entrance A at 6:45 A.M., then check in with the Admitting office. Any questions or running late day of surgery: call [phone]  Questions prior to your surgery date: call [phone], Monday-Friday, 8am-4pm. If you experience any cold or flu symptoms such as cough, fever, chills, shortness of breath, etc. between now and your scheduled surgery, please notify us  at the above number.     Remember:  Do not eat after midnight the night before your surgery  You may drink clear liquids until 5:45 the morning of your surgery.   Clear liquids allowed are: Water , Non-Citrus Juices (without pulp), Carbonated Beverages, Clear Tea (no milk, honey, etc.), Black Coffee Only (NO MILK, CREAM OR POWDERED CREAMER of any kind), and Gatorade.    Take these medicines the morning of surgery with A SIP OF WATER   lamoTRIgine  (LAMICTAL )  omeprazole (PRILOSEC)  rivastigmine  (EXELON )  rosuvastatin  (CRESTOR )  sertraline  (ZOLOFT )   May take these medicines IF NEEDED: albuterol  (VENTOLIN  HFA) inhaler - bring with you on day of surgery methocarbamol  (ROBAXIN )  ondansetron  (ZOFRAN )    Per your cardiologist's instruction, HOLD your ELIQUIS  for 2 day's prior to surgery.  Last dose on Friday, October 17th.    One week prior to surgery, STOP taking any Aspirin  (unless otherwise instructed by your surgeon) Aleve , Naproxen , Ibuprofen , Motrin , Advil , Goody's, BC's, all herbal medications, fish oil, and non-prescription vitamins.  WHAT DO I DO ABOUT MY DIABETES MEDICATION?   Do not take oral diabetes medicines metformin (FORTAMET) the morning of surgery.   If your CBG is greater than 220 mg/dL, you may take  of your sliding scale (correction) dose of insulin .   HOW TO MANAGE YOUR DIABETES BEFORE AND AFTER SURGERY  Why is it important to control my blood sugar before and after surgery? Improving  blood sugar levels before and after surgery helps healing and can limit problems. A way of improving blood sugar control is eating a healthy diet by:  Eating less sugar and carbohydrates  Increasing activity/exercise  Talking with your doctor about reaching your blood sugar goals High blood sugars (greater than 180 mg/dL) can raise your risk of infections and slow your recovery, so you will need to focus on controlling your diabetes during the weeks before surgery. Make sure that the doctor who takes care of your diabetes knows about your planned surgery including the date and location.  How do I manage my blood sugar before surgery? Check your blood sugar at least 4 times a day, starting 2 days before surgery, to make sure that the level is not too high or low.  Check your blood sugar the morning of your surgery when you wake up and every 2 hours until you get to the Short Stay unit.  If your blood sugar is less than 70 mg/dL, you will need to treat for low blood sugar: Do not take insulin . Treat a low blood sugar (less than 70 mg/dL) with  cup of clear juice (cranberry or apple), 4 glucose tablets, OR glucose gel. Recheck blood sugar in 15 minutes after treatment (to make sure it is greater than 70 mg/dL). If your blood sugar is not greater than 70 mg/dL on recheck, call [phone] for further instructions. Report your blood sugar to the short stay nurse when you get to Short Stay.  If you are admitted to the hospital after surgery: Your blood sugar will be checked by  the staff and you will probably be given insulin  after surgery (instead of oral diabetes medicines) to make sure you have good blood sugar levels. The goal for blood sugar control after surgery is 80-180 mg/dL.                     Do NOT Smoke (Tobacco/Vaping) for 24 hours prior to your procedure.  If you use a CPAP at night, you may bring your mask/headgear for your overnight stay.   You will be asked to remove any  contacts, glasses, piercing's, hearing aid's, dentures/partials prior to surgery. Please bring cases for these items if needed.    Patients discharged the day of surgery will not be allowed to drive home, and someone needs to stay with them for 24 hours.  SURGICAL WAITING ROOM VISITATION Patients may have no more than 2 support people in the waiting area - these visitors may rotate.   Pre-op  nurse will coordinate an appropriate time for 1 ADULT support person, who may not rotate, to accompany patient in pre-op .  Children under the age of 5 must have an adult with them who is not the patient and must remain in the main waiting area with an adult.  If the patient needs to stay at the hospital during part of their recovery, the visitor guidelines for inpatient rooms apply.  Please refer to the [facility] website for the visitor guidelines for any additional information.   If you received a COVID test during your pre-op  visit  it is requested that you wear a mask when out in public, stay away from anyone that may not be feeling well and notify your surgeon if you develop symptoms. If you have been in contact with anyone that has tested positive in the last 10 days please notify you surgeon.      Pre-operative 4 CHG Bathing Instructions   You can play a key role in reducing the risk of infection after surgery. Your skin needs to be as free of germs as possible. You can reduce the number of germs on your skin by washing with CHG (chlorhexidine  gluconate) soap before surgery. CHG is an antiseptic soap that kills germs and continues to kill germs even after washing.   DO NOT use if you have an allergy to chlorhexidine /CHG or antibacterial soaps. If your skin becomes reddened or irritated, stop using the CHG and notify one of our RNs at [phone].   Please shower with the CHG soap starting 4 days before surgery using the following schedule:     Please keep in mind the following:  DO NOT  shave, including legs and underarms, starting the day of your first shower.   You may shave your face at any point before/day of surgery.  Place clean sheets on your bed the day you start using CHG soap. Use a clean washcloth (not used since being washed) for each shower. DO NOT sleep with pets once you start using the CHG.   CHG Shower Instructions:  Wash your face and private area with normal soap. If you choose to wash your hair, wash first with your normal shampoo.  After you use shampoo/soap, rinse your hair and body thoroughly to remove shampoo/soap residue.  Turn the water  OFF and apply  bottle of CHG soap to a CLEAN washcloth.  Apply CHG soap ONLY FROM YOUR NECK DOWN TO YOUR TOES (washing for 3-5 minutes)  DO NOT use CHG soap on face, private areas, open wounds,  or sores.  Pay special attention to the area where your surgery is being performed.  If you are having back surgery, having someone wash your back for you may be helpful. Wait 2 minutes after CHG soap is applied, then you may rinse off the CHG soap.  Pat dry with a clean towel  Put on clean clothes/pajamas   If you choose to wear lotion, please use ONLY the CHG-compatible lotions that are listed below.  Additional instructions for the day of surgery:  If you choose, you may shower the morning of surgery with an antibacterial soap.  DO NOT APPLY any lotions, deodorants, powders, cream, or perfumes.   Do not bring valuables to the hospital. [facility] is not responsible for any belongings/valuables. Do not wear nail polish, gel polish, artificial nails, or any other type of covering on natural nails (fingers and toes) Do not wear jewelry or makeup Put on clean/comfortable clothes.  Please brush your teeth.  Ask your nurse before applying any prescription medications to the skin.     CHG Compatible Lotions   Aveeno Moisturizing lotion  Cetaphil Moisturizing Cream  Cetaphil Moisturizing Lotion  Clairol Herbal  Essence Moisturizing Lotion, Dry Skin  Clairol Herbal Essence Moisturizing Lotion, Extra Dry Skin  Clairol Herbal Essence Moisturizing Lotion, Normal Skin  Curel Age Defying Therapeutic Moisturizing Lotion with Alpha Hydroxy  Curel Extreme Care Body Lotion  Curel Soothing Hands Moisturizing Hand Lotion  Curel Therapeutic Moisturizing Cream, Fragrance-Free  Curel Therapeutic Moisturizing Lotion, Fragrance-Free  Curel Therapeutic Moisturizing Lotion, Original Formula  Eucerin Daily Replenishing Lotion  Eucerin Dry Skin Therapy Plus Alpha Hydroxy Crme  Eucerin Dry Skin Therapy Plus Alpha Hydroxy Lotion  Eucerin Original Crme  Eucerin Original Lotion  Eucerin Plus Crme Eucerin Plus Lotion  Eucerin TriLipid Replenishing Lotion  Keri Anti-Bacterial Hand Lotion  Keri Deep Conditioning Original Lotion Dry Skin Formula Softly Scented  Keri Deep Conditioning Original Lotion, Fragrance Free Sensitive Skin Formula  Keri Lotion Fast Absorbing Fragrance Free Sensitive Skin Formula  Keri Lotion Fast Absorbing Softly Scented Dry Skin Formula  Keri Original Lotion  Keri Skin Renewal Lotion Keri Silky Smooth Lotion  Keri Silky Smooth Sensitive Skin Lotion  Nivea Body Creamy Conditioning Oil  Nivea Body Extra Enriched Lotion  Nivea Body Original Lotion  Nivea Body Sheer Moisturizing Lotion Nivea Crme  Nivea Skin Firming Lotion  NutraDerm 30 Skin Lotion  NutraDerm Skin Lotion  NutraDerm Therapeutic Skin Cream  NutraDerm Therapeutic Skin Lotion  ProShield Protective Hand Cream  Provon moisturizing lotion  Please read over the following fact sheets that you were given.

## 2024-01-28 NOTE — Progress Notes (Signed)
 Device orders have been requested for the [employer] ICD.

## 2024-01-29 ENCOUNTER — Other Ambulatory Visit: Payer: Self-pay

## 2024-01-29 ENCOUNTER — Encounter (HOSPITAL_COMMUNITY): Payer: Self-pay

## 2024-01-29 ENCOUNTER — Encounter (HOSPITAL_COMMUNITY)
Admission: RE | Admit: 2024-01-29 | Discharge: 2024-01-29 | Disposition: A | Discharge status: Home / Self Care | Source: Ambulatory Visit | Attending: Orthopaedic Surgery | Admitting: Orthopaedic Surgery

## 2024-01-29 VITALS — BP 132/100 | HR 91 | Temp 98.5°F | Resp 17 | Ht 66.0 in | Wt 193.6 lb

## 2024-01-29 DIAGNOSIS — M1712 Unilateral primary osteoarthritis, left knee: Secondary | ICD-10-CM | POA: Diagnosis not present

## 2024-01-29 DIAGNOSIS — Z95810 Presence of automatic (implantable) cardiac defibrillator: Secondary | ICD-10-CM | POA: Insufficient documentation

## 2024-01-29 DIAGNOSIS — Z7984 Long term (current) use of oral hypoglycemic drugs: Secondary | ICD-10-CM | POA: Insufficient documentation

## 2024-01-29 DIAGNOSIS — Z01818 Encounter for other preprocedural examination: Secondary | ICD-10-CM

## 2024-01-29 DIAGNOSIS — D649 Anemia, unspecified: Secondary | ICD-10-CM | POA: Insufficient documentation

## 2024-01-29 DIAGNOSIS — Z9889 Other specified postprocedural states: Secondary | ICD-10-CM | POA: Insufficient documentation

## 2024-01-29 DIAGNOSIS — J4489 Other specified chronic obstructive pulmonary disease: Secondary | ICD-10-CM | POA: Diagnosis not present

## 2024-01-29 DIAGNOSIS — Z8674 Personal history of sudden cardiac arrest: Secondary | ICD-10-CM | POA: Diagnosis not present

## 2024-01-29 DIAGNOSIS — K219 Gastro-esophageal reflux disease without esophagitis: Secondary | ICD-10-CM | POA: Insufficient documentation

## 2024-01-29 DIAGNOSIS — Z01812 Encounter for preprocedural laboratory examination: Secondary | ICD-10-CM | POA: Insufficient documentation

## 2024-01-29 DIAGNOSIS — I480 Paroxysmal atrial fibrillation: Secondary | ICD-10-CM | POA: Diagnosis not present

## 2024-01-29 DIAGNOSIS — G931 Anoxic brain damage, not elsewhere classified: Secondary | ICD-10-CM | POA: Insufficient documentation

## 2024-01-29 DIAGNOSIS — E119 Type 2 diabetes mellitus without complications: Secondary | ICD-10-CM | POA: Insufficient documentation

## 2024-01-29 DIAGNOSIS — I10 Essential (primary) hypertension: Secondary | ICD-10-CM | POA: Diagnosis not present

## 2024-01-29 DIAGNOSIS — Z7901 Long term (current) use of anticoagulants: Secondary | ICD-10-CM | POA: Diagnosis not present

## 2024-01-29 HISTORY — DX: Presence of automatic (implantable) cardiac defibrillator: Z95.810

## 2024-01-29 HISTORY — DX: Sleep apnea, unspecified: G47.30

## 2024-01-29 LAB — BASIC METABOLIC PANEL WITH GFR
Anion gap: 11 (ref 5–15)
BUN: 12 mg/dL (ref 6–20)
CO2: 23 mmol/L (ref 22–32)
Calcium: 8.7 mg/dL — ABNORMAL LOW (ref 8.9–10.3)
Chloride: 104 mmol/L (ref 98–111)
Creatinine, Ser: 0.87 mg/dL (ref 0.44–1.00)
GFR, Estimated: >60 mL/min (ref >60)
Glucose, Bld: 167 mg/dL — ABNORMAL HIGH (ref 70–99)
Potassium: 4.1 mmol/L (ref 3.5–5.1)
Sodium: 138 mmol/L (ref 135–145)

## 2024-01-29 LAB — CBC
HCT: 40.1 % (ref 36.0–46.0)
Hemoglobin: 12 g/dL (ref 12.0–15.0)
MCH: 22.4 pg — ABNORMAL LOW (ref 26.0–34.0)
MCHC: 29.9 g/dL — ABNORMAL LOW (ref 30.0–36.0)
MCV: 74.8 fL — ABNORMAL LOW (ref 80.0–100.0)
Platelets: 275 K/uL (ref 150–400)
RBC: 5.36 MIL/uL — ABNORMAL HIGH (ref 3.87–5.11)
RDW: 16.4 % — ABNORMAL HIGH (ref 11.5–15.5)
WBC: 5 K/uL (ref 4.0–10.5)
nRBC: 0 % (ref 0.0–0.2)

## 2024-01-29 LAB — HEMOGLOBIN A1C
Hgb A1c MFr Bld: 5.9 % — ABNORMAL HIGH (ref 4.8–5.6)
Mean Plasma Glucose: 122.63 mg/dL

## 2024-01-29 LAB — GLUCOSE, CAPILLARY: Glucose-Capillary: 159 mg/dL — ABNORMAL HIGH (ref 70–99)

## 2024-01-29 NOTE — Progress Notes (Addendum)
 This RN notified Dr. Jerri scheduler and informed them the patient will need to be seen in office for her device clearance as she is overdue. Once seen in office, they will advise on device orders for surgery.

## 2024-01-29 NOTE — Progress Notes (Addendum)
 PCP - Dr. Greig Cluster Cardiologist - Dr. Candyce Reek Courtney Ford NP) Courtney Davies 07-08-23  PPM/ICD - [facility] [employer] ICD Device Orders - No - received message that patient will need to be seen in office prior to being given device orders Rep Notified - No  Chest x-ray - n/a EKG - 07-08-23 Stress Test - denies ECHO - 01-09-23 Cardiac Cath - denies  Sleep Study - denies CPAP - n/a  Fasting Blood Sugar / Checks Blood Sugar - per patient does not know; she does not check blood sugar.   Last dose of GLP1 agonist- Denies   GLP1 instructions: n/a  Blood Thinner Instructions: Eliquis  - hold 3 day's prior to surgery - per patient she does not take. Aspirin  Instructions:Per patient denies  ERAS Protcol - clears until 0545 PRE-SURGERY Ensure or G2- none - pt refused  COVID TEST- n/a   Anesthesia review: yes, HTN, DM2, V-Fib arrest, brain injury, [employer] ICD  Patient denies shortness of breath, fever, cough and chest pain at PAT appointment. Patient denies any respiratory issues at this time.    All instructions explained to the patient, with a verbal understanding of the material. Patient agrees to go over the instructions while at home for a better understanding. Patient also instructed to self quarantine after being tested for COVID-19. The opportunity to ask questions was provided.

## 2024-01-30 NOTE — Progress Notes (Signed)
 Anesthesia Chart Review:  Case: [id number] Date/Time: 02/03/24 0830   Procedure: ARTHROPLASTY, KNEE, TOTAL (Left: Knee)   Anesthesia type: Spinal   Diagnosis: Primary osteoarthritis of left knee [M17.12]   Pre-op  diagnosis: left knee osteoarthritis   [facility]: [facility] ROOM 05 / [facility]   Surgeons: Jerri Kay HERO, MD       DISCUSSION: Patient is a 48 year old female scheduled for the above surgery.   History includes smoking, HTN, vfib cardiac arrest (12/01/2020, with anoxic brain injury, improved; no CAD on CCTA 10/20/022), ICD [facility] Scientific A219 Emblem MRI SubQ ICD 09/13/2021), PAF, DM2, asthma, chronic bronchitis, anemia, GERD, migraines, hysterectomy.  Possible TIA 01/08/2023 (vs acute vertigo vs complicated migraine vs functional neurologic symptoms, see below). +Marijuana. She denied prior sleep study.   [facility] admission 01/08/2023 - 01/12/2023 for acute onset dizziness and blurred vision. Head CT without acute abnormality. CTA head and neck was negative. Despite low NIHSS of 3 that was nonfocal, a decision was made to administer TNK given severe vertigo with concern for posterior circulation infarct. Repeat CT without acute abnormality. Brain MRI was delayed due to patient with ICD, but was done on 01/10/2024 and was negative for acute infarct. No intracardiac source of embolism seen on TTE. Symptoms possibly related to TIA, acute vertigo, complicated migraine, or functional neurological symptoms. 11/2022 ICD interrogation had shown episodes of PAF vs frequent ectopy, so after discussion with EP decision made to start Eliquis  BID.   [place] admission 12/01/2020 - 01/02/2021 for out-of-hospital cardiac arrest. Coworkers heard her collapse. When EMS arrived she was in vfib, s/p CPR, epinephrine  and defibrillations x 5, amiodarone  400 mg, and epinephrine  drip after ROSC. Troponin peaked at 5186. LFTs elevated. Acidotic on ABG. COVID-19 positive, but had known positive test 3 weeks prior. Head CT  showed no acute abnormality. Chest CTA negative for PE, but did show BLE consolidations suspicious for aspiration and received antibiotic therapy.  TTE showed LVEF 40-45%, global hypokinesis, grade 1 DD, normal RV function, mild to moderate MR. Unclear if low EF was caused by transient hypoperfusion from cardiac arrest, or if it may have contributed to her arrest. A pulmonary event was also considered given recent COVID-19. Further cardiac work-up planned once her condition improved. 12/04/2020 brain MRI findings consistent with hypoxic injury. She was extubated on 12/06/2020 but had issues with dysphagia and cognitive function. She required a PEG tube on 12/13/2020, which was ultimately removed on 12/25/2020 after she developed abdominal wall cellulitis and began to tolerate oral intake. She was discharged to CIR for ongoing rehab therapies 01/02/2021 - 01/24/2021 and made further progress in her cognitive function and independence since then.   Since her cardiac arrest, she did undergo additional cardiac testing. Coronary CTA obtained in October 2022 showed no evidence of CAD. Cardiac MRI in November 2022 showed normal LV size, mild hypertrophy, low normal systolic function EF 53 %, septal bounce, normal RV size and systolic function of 71%, no LGE, mild MR. No evidence of infiltrative heart disease. Because there was no clear reason for her arrhythmia, a decision was made to place ICD.  [employer] S-ICD placed on 09/13/2021. Last TTE on 01/09/2023 showed EF 60 to 65%, no regional wall motion abnormalities, normal RV, mild MR.  Her last cardiology visit was on 07/08/2023 with Hao Meng, PA for follow-up and preoperative evaluation. He wrote: Preoperative cardiac clearance for left total knee surgery Undergoing preoperative evaluation for left total knee surgery. Moderate risk due to cardiac  history, but surgery is low risk. Cleared for surgery from cardiac perspective - previous coronary CT shows no  significant CAD - Hold Eliquis  for three days prior to the procedure and restart as soon as possible afterward at the surgeon's discretion. - Use a large Tegaderm to tape a magnet over the ICD during the surgery to prevent interference from electrocautery. - Follow up with Dr. Inocencio in six months.  EP Clinic would not complete the perioperative cardiac device form since her last EP visit over a year ago on 11/23/2022 with Daphne Barrack, NP. As above, in March, Hao Arlee, [place] did discuss recommendations with EP, and they  recommended to use a large Tegaderm to tape a magnet over the S-ICD during surgery. As of 10/23/2023 remote interrogation her device had normal device function, longest PAF episode 4.7 hours.  Patient reported she is not currently taking Eliquis  or Jardiance .  Discussed with anesthesiologist Dr. Marney Needle. She has preoperative cardiology input, although EP device form was not completed as outlined above. She has been doing remote checks and ICD was addressed at her March 2025 visit. Surgical site is below the umbilicus. Anesthesia team to evaluate on the day of surgery.     VS: BP (!) 132/100   Pulse 91   Temp 36.9 C   Resp 17   Ht 5' 6 (1.676 m)   Wt 87.8 kg   LMP 12/03/2012   SpO2 100%   BMI 31.25 kg/m  BP Readings from Last 3 Encounters:  01/29/24 (!) 132/100  01/08/24 (!) 143/88  01/03/24 (!) 146/99    PROVIDERS: [name] BRAVO, NP is PCP  Dann Beverage, MD is listed as primary cardiologist, but he is relocated. She is primarily followed by EP.  Inocencio Chi, MD is EP cardiologist Curry Paterson, MD is psychiatrist Babs Hussar, MD is PM&R Margaret Carne, MD is neurologist   LABS: Labs reviewed: Acceptable for surgery. (all labs ordered are listed, but only abnormal results are displayed)  Labs Reviewed  GLUCOSE, CAPILLARY - Abnormal; Notable for the following components:      Result Value   Glucose-Capillary 159 (*)    All other  components within normal limits  HEMOGLOBIN A1C - Abnormal; Notable for the following components:   Hgb A1c MFr Bld 5.9 (*)    All other components within normal limits  CBC - Abnormal; Notable for the following components:   RBC 5.36 (*)    MCV 74.8 (*)    MCH 22.4 (*)    MCHC 29.9 (*)    RDW 16.4 (*)    All other components within normal limits  BASIC METABOLIC PANEL WITH GFR - Abnormal; Notable for the following components:   Glucose, Bld 167 (*)    Calcium  8.7 (*)    All other components within normal limits    PFTs 10/23/2021: FVC 2.88 (74%), post 3.15 (81%). FEV1 2.04 (65%), post 2.29 (73%). DLCO unc 24.14 (105%), cor 24.44 (106%).    IMAGES: US  Thyroid  01/10/2024: IMPRESSION: 1. Multiple thyroid  nodules. None meet criteria for biopsy. 2. Recommend follow-up ultrasound at 1, 2, 3, and 5 years for dominant nodules above.  CT C-spine 01/03/2024: IMPRESSION: 1. No acute abnormality of the cervical spine related to the reported neck trauma. 2. Prominence of the thyroid  with multiple nodules. Consider nonemergent thyroid  ultrasound for further evaluation.   CT Maxillofacial 01/03/2024: IMPRESSION: 1. No acute maxillofacial fracture.   CT Head 01/03/2024: IMPRESSION: 1. No acute intracranial abnormality. CT Abd/pelvis 12/03/2023 ([facility]): 1.  No acute findings in the abdomen or pelvis.  2.  Ancillary findings as above.   US  Abd RUQ 10/22/2023 (for elevated alk phos): IMPRESSION: No significant sonographic abnormality of the liver or gallbladder.  CXR 05/05/2023: FINDINGS: Defibrillator over the lower left chest wall unchanged. Lungs are adequately inflated and otherwise clear. Cardiomediastinal silhouette and remainder of the exam is unchanged. IMPRESSION: No active cardiopulmonary disease.   MRI Brain 01/10/2023: MPRESSION: No evidence of acute intracranial abnormality.  CTA Head/Neck 01/08/2023: MPRESSION: Negative CTA. No occlusion, stenosis, or irregularity  of major arteries in the head and neck.   EKG: 07/08/2023: Normal sinus rhythm Poor R wave progression in the anterior leads Confirmed by Meng, Hao [phone]) on 07/09/2023 2:17:34 PM   CV: TTE 01/09/2023: IMPRESSIONS   1. Left ventricular ejection fraction, by estimation, is 60 to 65%. The  left ventricle has normal function. The left ventricle has no regional  wall motion abnormalities. Left ventricular diastolic parameters are  indeterminate.   2. Right ventricular systolic function is normal. The right ventricular  size is normal. Tricuspid regurgitation signal is inadequate for assessing  PA pressure.   3. The mitral valve is normal in structure. Mild mitral valve  regurgitation. No evidence of mitral stenosis.   4. The aortic valve is normal in structure. Aortic valve regurgitation is  not visualized. No aortic stenosis is present.   5. The inferior vena cava is dilated in size with >50% respiratory  variability, suggesting right atrial pressure of 8 mmHg.  - Conclusion(s)/Recommendation(s): No intracardiac source of embolism  detected on this transthoracic study. Consider agitated saline contrast  injection and/or transesophageal echocardiogram to exclude cardiac source  of embolism if clinically indicated.    EP Procedure 09/13/2021: CONCLUSIONS:  1. Ventricular fibrillation arrest 2. Successful subcutaneous ICD implantation.  3. No early apparent complications.    MRI Cardiac 03/14/2021: IMPRESSION: 1. Normal LV size, mild hypertrophy, and low normal systolic function (EF 53%). Septal bounce 2.  Normal RV size and systolic function (EF 71%) 3.  No late gadolinium enhancement 4.  Mild mitral regurgitation (regurgitant fraction 11%)   CT Coronary Morph with CTA 02/02/2021: IMPRESSION: 1. Coronary calcium  score of 0. 2. Normal coronary origin with right dominance. 3. Normal coronary arteries. RECOMMENDATIONS: 1. No evidence of CAD (0%). Consider  non-atherosclerotic causes of chest pain.   Past Medical History:  Diagnosis Date   AICD (automatic cardioverter/defibrillator) present    [employer] ICD   Anemia 09/2015   Anoxic brain injury (HCC)    Arthritis    knees (04/23/2017)   Asthma    teens; went away; came back (04/23/2017)   B12 deficiency anemia 04/23/2017   Cardiac arrest (HCC)    Chronic bronchitis (HCC)    Chronic low back pain with sciatica    Chronic lower back pain    Diabetes mellitus without complication (HCC)    Diabetes type 2, controlled (HCC)    Elevated ferritin level    Fatty liver    GERD (gastroesophageal reflux disease)    GERD with stricture    Headache    1-2/wk (04/23/2017)   History of blood transfusion plenty   related to anemia (04/23/2017)   Hypertension    Inguinal hernia    Low back pain    Migraine    1-2/month (04/23/2017)   OA (osteoarthritis) of knee--left    Paroxysmal atrial fibrillation (HCC)    Sub-clinical -seen on device?, elevated CHADSVASC   Sleep apnea  Symptomatic anemia 04/23/2017   Vitamin B 12 deficiency     Past Surgical History:  Procedure Laterality Date   ABDOMINAL HYSTERECTOMY  YRS AGO   COMPLETE   CESAREAN SECTION  1998; 2002   ESOPHAGEAL DILATION  02/2020   by Dr Marvis FONTANA HERNIA REPAIR Bilateral 1980s    total of 4 surgeries (04/23/2017)   IR GASTROSTOMY TUBE MOD SED  12/13/2020   KNEE ARTHROSCOPY WITH MEDIAL MENISECTOMY Left 01/30/2018   Procedure: LEFT KNEE ARTHROSCOPY WITH PARTIAL LATERAL MENISCECTOMY;  Surgeon: Vernetta Lonni GRADE, MD;  Location: [facility];  Service: Orthopedics;  Laterality: Left;   KNEE CARTILAGE SURGERY Left    SUBQ ICD IMPLANT N/A 09/13/2021   Procedure: SUBQ ICD IMPLANT;  Surgeon: [name], MD;  Location: [facility] INVASIVE CV LAB;  Service: Cardiovascular;  Laterality: N/A;   TUBAL LIGATION  2002    MEDICATIONS:  oxyCODONE -acetaminophen  (PERCOCET) 5-325 MG tablet   ACCU-CHEK GUIDE TEST test  strip   Accu-Chek Softclix Lancets lancets   albuterol  (VENTOLIN  HFA) 108 (90 Base) MCG/ACT inhaler   alum & mag hydroxide-simeth (MAALOX MAX) 400-400-40 MG/5ML suspension   BD PEN NEEDLE NANO 2ND GEN 32G X 4 MM MISC   Blood Glucose Monitoring Suppl (ACCU-CHEK GUIDE ME) w/Device KIT   carvedilol  (COREG ) 6.25 MG tablet   cetirizine  (ZYRTEC ) 10 MG tablet   docusate sodium  (COLACE) 100 MG capsule   doxycycline  (VIBRA -TABS) 100 MG tablet   ELIQUIS  5 MG TABS tablet   gabapentin  (NEURONTIN ) 100 MG capsule   hydrOXYzine  (ATARAX ) 10 MG tablet   JARDIANCE  10 MG TABS tablet   lamoTRIgine  (LAMICTAL ) 100 MG tablet   linaclotide  (LINZESS ) 145 MCG CAPS capsule   losartan  (COZAAR ) 25 MG tablet   metformin (FORTAMET) 500 MG (OSM) 24 hr tablet   methocarbamol  (ROBAXIN ) 750 MG tablet   omeprazole (PRILOSEC) 40 MG capsule   ondansetron  (ZOFRAN ) 4 MG tablet   QUEtiapine  150 MG TABS   rivastigmine  (EXELON ) 3 MG capsule   rosuvastatin  (CRESTOR ) 20 MG tablet   sertraline  (ZOLOFT ) 100 MG tablet   No current facility-administered medications for this encounter.    Isaiah Ruder, PA-C Surgical Short Stay/Anesthesiology [facility] Phone [phone] [facility] Phone [phone] 01/31/2024 10:17 AM

## 2024-01-31 ENCOUNTER — Encounter (HOSPITAL_COMMUNITY): Payer: Self-pay

## 2024-01-31 NOTE — Anesthesia Preprocedure Evaluation (Signed)
 Anesthesia Evaluation    Airway        Dental   Pulmonary Current Smoker and Patient abstained from smoking.          Cardiovascular hypertension,      Neuro/Psych    GI/Hepatic   Endo/Other  diabetes    Renal/GU      Musculoskeletal   Abdominal   Peds  Hematology   Anesthesia Other Findings   Reproductive/Obstetrics                              Anesthesia Physical Anesthesia Plan  ASA:   Anesthesia Plan:    Post-op Pain Management:    Induction:   PONV Risk Score and Plan:   Airway Management Planned:   Additional Equipment:   Intra-op Plan:   Post-operative Plan:   Informed Consent:   Plan Discussed with:   Anesthesia Plan Comments: (See PAT note written 01/31/2024 by Detric Scalisi, PA-C. Vfib arrest 11/2020. No definite cause identified in work-up, so s/p Architect.  Her last cardiology visit was on 07/08/2023 with Hao Meng, PA for follow-up and preoperative evaluation. He wrote: Preoperative cardiac clearance for left total knee surgery Undergoing preoperative evaluation for left total knee surgery. Moderate risk due to cardiac history, but surgery is low risk. Cleared for surgery from cardiac perspective - previous coronary CT shows no significant CAD - Hold Eliquis  for three days prior to the procedure and restart as soon as possible afterward at the surgeon's discretion. - Use a large Tegaderm to tape a magnet over the ICD during the surgery to prevent interference from electrocautery. - Follow up with Dr. Inocencio in six months. )        Anesthesia Quick Evaluation

## 2024-02-02 ENCOUNTER — Other Ambulatory Visit: Payer: Self-pay | Admitting: Physician Assistant

## 2024-02-03 ENCOUNTER — Encounter (HOSPITAL_COMMUNITY): Admission: RE | Payer: Self-pay | Source: Home / Self Care

## 2024-02-03 ENCOUNTER — Ambulatory Visit (HOSPITAL_COMMUNITY): Admission: RE | Admit: 2024-02-03 | Source: Home / Self Care | Admitting: Orthopaedic Surgery

## 2024-02-03 ENCOUNTER — Encounter (HOSPITAL_COMMUNITY): Payer: Self-pay | Admitting: Vascular Surgery

## 2024-02-03 DIAGNOSIS — M1712 Unilateral primary osteoarthritis, left knee: Secondary | ICD-10-CM

## 2024-02-03 SURGERY — ARTHROPLASTY, KNEE, TOTAL
Anesthesia: Spinal | Site: Knee | Laterality: Left

## 2024-02-03 NOTE — Telephone Encounter (Signed)
 We have not operated on her yet.  Why does she need a refill?

## 2024-02-06 ENCOUNTER — Ambulatory Visit: Admitting: Orthopedic Surgery

## 2024-02-12 NOTE — Progress Notes (Signed)
 Remote ICD transmission.

## 2024-02-17 ENCOUNTER — Encounter: Payer: Self-pay | Admitting: Radiology

## 2024-02-19 ENCOUNTER — Encounter: Admitting: Physician Assistant

## 2024-02-19 ENCOUNTER — Telehealth (HOSPITAL_BASED_OUTPATIENT_CLINIC_OR_DEPARTMENT_OTHER): Payer: Self-pay | Admitting: *Deleted

## 2024-02-19 ENCOUNTER — Telehealth: Payer: Self-pay

## 2024-02-19 NOTE — Telephone Encounter (Addendum)
 Patient aware and form is on desk

## 2024-02-19 NOTE — Telephone Encounter (Signed)
 Pharmacy please advise on holding Eliquis  prior to LEFT TOTAL KNEE  scheduled for TBD. Last labs 01/29/2024. Thank you.

## 2024-02-19 NOTE — Telephone Encounter (Signed)
   Name: LAVANYA ROA  DOB: 1975-05-31  MRN: [id number]  Primary Cardiologist: Candyce Reek, MD   Preoperative team, please contact this patient and set up a phone call appointment for further preoperative risk assessment. Please obtain consent and complete medication review. Thank you for your help.   Mrs. Willenbring was cleared on July 08, 2023 by Scot Ford, PA for same surgery.  He also recommended Use a large Tegaderm to tape a magnet over the ICD during the surgery to prevent interference from electrocautery.   I confirm that guidance regarding antiplatelet and oral anticoagulation therapy has been completed and, if necessary, noted below. Sent to pharmacy on 02/19/2024 for recommendation on Eliquis  hold.  I also confirmed the patient resides in the state of [place] . As per [facility] Medical Board telemedicine laws, the patient must reside in the state in which the provider is licensed.   Lamarr Satterfield, NP 02/19/2024, 8:25 AM [facility]

## 2024-02-19 NOTE — Telephone Encounter (Signed)
 Pt has been scheduled tele preop appt 02/26/24, per pt surgeon waiting on clearance before scheduling.   Med rec and consent are done.      Patient Consent for Virtual Visit        Courtney Davies has provided verbal consent on 02/19/2024 for a virtual visit (video or telephone).   CONSENT FOR VIRTUAL VISIT FOR:  Courtney Davies  By participating in this virtual visit I agree to the following:  I hereby voluntarily request, consent and authorize [facility] and its employed or contracted physicians, physician assistants, nurse practitioners or other licensed health care professionals (the Practitioner), to provide me with telemedicine health care services (the "Services) as deemed necessary by the treating Practitioner. I acknowledge and consent to receive the Services by the Practitioner via telemedicine. I understand that the telemedicine visit will involve communicating with the Practitioner through live audiovisual communication technology and the disclosure of certain medical information by electronic transmission. I acknowledge that I have been given the opportunity to request an in-person assessment or other available alternative prior to the telemedicine visit and am voluntarily participating in the telemedicine visit.  I understand that I have the right to withhold or withdraw my consent to the use of telemedicine in the course of my care at any time, without affecting my right to future care or treatment, and that the Practitioner or I may terminate the telemedicine visit at any time. I understand that I have the right to inspect all information obtained and/or recorded in the course of the telemedicine visit and may receive copies of available information for a reasonable fee.  I understand that some of the potential risks of receiving the Services via telemedicine include:  Delay or interruption in medical evaluation due to technological equipment failure or  disruption; Information transmitted may not be sufficient (e.g. poor resolution of images) to allow for appropriate medical decision making by the Practitioner; and/or  In rare instances, security protocols could fail, causing a breach of personal health information.  Furthermore, I acknowledge that it is my responsibility to provide information about my medical history, conditions and care that is complete and accurate to the best of my ability. I acknowledge that Practitioner's advice, recommendations, and/or decision may be based on factors not within their control, such as incomplete or inaccurate data provided by me or distortions of diagnostic images or specimens that may result from electronic transmissions. I understand that the practice of medicine is not an exact science and that Practitioner makes no warranties or guarantees regarding treatment outcomes. I acknowledge that a copy of this consent can be made available to me via my patient portal [facility] MyChart), or I can request a printed copy by calling the office of [facility].    I understand that my insurance will be billed for this visit.   I have read or had this consent read to me. I understand the contents of this consent, which adequately explains the benefits and risks of the Services being provided via telemedicine.  I have been provided ample opportunity to ask questions regarding this consent and the Services and have had my questions answered to my satisfaction. I give my informed consent for the services to be provided through the use of telemedicine in my medical care

## 2024-02-19 NOTE — Telephone Encounter (Signed)
 Copied from CRM #[id number]. Topic: Clinical - Medical Advice >> Feb 19, 2024  8:29 AM [facility] OR[facility] wrote: Reason for CRM: patient want to know if need another  assessment to have knee surgery or is the one that was already done is good enough

## 2024-02-19 NOTE — Telephone Encounter (Signed)
 Pt has been scheduled tele preop appt 02/26/24, per pt surgeon waiting on clearance before scheduling.   Med rec and consent are done.

## 2024-02-19 NOTE — Telephone Encounter (Signed)
 I do not know how long pre-operative clearance is good for. I would ask orthopedic surgeon, Dr. Jerri with [facility].

## 2024-02-19 NOTE — Telephone Encounter (Signed)
 From the message from Device Clinic informing EP Schedulers that patient needs pre-op  clearance for ICD prior to 10/20 procedure; below are our attempts to contact patient w/ no success in reaching patient or hearing back from patient.   10.31.2025 10:31am call x4; [email or web address] to schedule w/ EP APP for 6 mo f/u + preop clearance for LT TKR. 02/10/24 LVM to schedule preop appt with EP APP for device clearance. 02/06/24 LVM to schedule preop appt with EP APP for device clearance. 02/04/24 LVM to schedule preop appt with EP APP for device clearance.

## 2024-02-19 NOTE — Telephone Encounter (Signed)
 Patient states the surgeon told her that 90 days is the time frame. Patient states it's a few days away from the 90 days and the ortho office told her to call here to ask if provider would sign another surgery clearance form or if another appointment is needed to clear.

## 2024-02-19 NOTE — Telephone Encounter (Signed)
   Pre-operative Risk Assessment    Patient Name: Courtney Davies  DOB: 1975-06-20 MRN: [id number]   Date of last office visit: 07/08/23 HAO MENG, PAC Date of next office visit: NONE   Request for Surgical Clearance    Procedure:  LEFT TOTAL KNEE  Date of Surgery:  Clearance TBD                                Surgeon:  DR. KAY OZELL CUMMINS Surgeon's Group or Practice Name:  [facility] Phone number:  [phone]; [facility] Fax number:  [phone]   Type of Clearance Requested:   - Medical  - Pharmacy:  Hold Apixaban  (Eliquis )     Type of Anesthesia:  Spinal   Additional requests/questions:    Courtney Davies   02/19/2024, 8:14 AM

## 2024-02-19 NOTE — Telephone Encounter (Signed)
 Leave new preop clearance papers on my desk and I will sign. No appointment needed.

## 2024-02-19 NOTE — Telephone Encounter (Signed)
-----   Message from Nurse Prentice ORN sent at 01/30/2024 11:12 AM EDT ----- Regarding: FW: SECURE (02-03-24)  ----- Message ----- From: Brien Prentice PARAS, RN Sent: 01/28/2024   5:02 PM EDT To: [facility] Scheduling Subject: FW: SECURE (02-03-24)                          Patient over due for office visit. Needs OV for surgical clearance ----- Message ----- From: Gustav Rexene [name], RN Sent: 01/28/2024   4:01 PM EDT To: [facility] Device Subject: SECURE (02-03-24)                              Patient: Courtney Davies, Courtney Davies DOB: April 30, 1975 MRN: [id number]  Surgery date: 02/03/24 Surgery Time: 9154-8947 Surgeon: Dr.  Kay Cummins Surgery: Left total knee replacement

## 2024-02-20 ENCOUNTER — Encounter: Payer: Self-pay | Admitting: Student

## 2024-02-20 ENCOUNTER — Ambulatory Visit: Attending: Student | Admitting: Student

## 2024-02-20 ENCOUNTER — Other Ambulatory Visit: Payer: Self-pay | Admitting: Orthopedic Surgery

## 2024-02-20 ENCOUNTER — Telehealth: Payer: Self-pay

## 2024-02-20 VITALS — BP 157/89 | HR 65 | Ht 66.0 in | Wt 181.0 lb

## 2024-02-20 DIAGNOSIS — I639 Cerebral infarction, unspecified: Secondary | ICD-10-CM

## 2024-02-20 DIAGNOSIS — I10 Essential (primary) hypertension: Secondary | ICD-10-CM | POA: Diagnosis not present

## 2024-02-20 DIAGNOSIS — Z01810 Encounter for preprocedural cardiovascular examination: Secondary | ICD-10-CM

## 2024-02-20 DIAGNOSIS — I480 Paroxysmal atrial fibrillation: Secondary | ICD-10-CM

## 2024-02-20 DIAGNOSIS — I4901 Ventricular fibrillation: Secondary | ICD-10-CM | POA: Diagnosis not present

## 2024-02-20 LAB — CUP PACEART INCLINIC DEVICE CHECK
Date Time Interrogation Session: 20251106125712
Implantable Lead Connection Status: 753985
Implantable Lead Implant Date: 20230531
Implantable Lead Location: 753862
Implantable Lead Model: 3501
Implantable Lead Serial Number: 235497
Implantable Pulse Generator Implant Date: 20230531
Pulse Gen Serial Number: 177788

## 2024-02-20 MED ORDER — APIXABAN 5 MG PO TABS: 5.0000 mg | ORAL_TABLET | Freq: Two times a day (BID) | ORAL | 5 refills | Status: AC

## 2024-02-20 NOTE — Telephone Encounter (Addendum)
 Per Amy's request call patient to find out if she restarted rosuvastatin . I called patient and she was unsure and stated Let me ask my mom. Patient was unable to locate her mom and then asked me to remind her of the situation concerning the rosuvastatin , I was unable to locate why Amy prompted me to call patient about this medication.  Patient stated she will find out from her mom if she is taking and asked that I ask Amy what prompted this call and call her back.  Please advise

## 2024-02-20 NOTE — Progress Notes (Signed)
  Electrophysiology Office Note:   ID:  Courtney, Davies Jan 02, 1976, MRN [id number]  Primary Cardiologist: Candyce Reek, MD Electrophysiologist: Soyla Gladis Norton, MD      History of Present Illness:   Courtney Davies is a 48 y.o. 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   Since last being seen in our clinic the patient reports doing OK from a cardiac perspective. Currently, she denies chest pain, palpitations, dyspnea, PND, orthopnea, nausea, vomiting, dizziness, syncope, edema, weight gain, or early satiety.   Review of systems complete and found to be negative unless listed in HPI.   EP Information / Studies Reviewed:    EKG is ordered today. Personal review as below.  EKG Interpretation Date/Time:  Thursday February 20 2024 11:45:43 EST Ventricular Rate:  65 PR Interval:  162 QRS Duration:  64 QT Interval:  402 QTC Calculation: 418 R Axis:   77  Text Interpretation: Normal sinus rhythm Stable from prior Confirmed by Lesia Sharper [phone]) on 02/20/2024 11:47:56 AM    ICD Interrogation-  reviewed in detail today,  See PACEART report.  Arrhythmia/Device History BSX SUB Q ICD LATITUDE-WC   Physical Exam:   VS:  BP (!) 157/89   Pulse 65   Ht 5' 6 (1.676 m)   Wt 181 lb (82.1 kg)   LMP 12/03/2012   SpO2 100%   BMI 29.21 kg/m    Wt Readings from Last 3 Encounters:  02/20/24 181 lb (82.1 kg)  01/29/24 193 lb 9.6 oz (87.8 kg)  01/08/24 196 lb (88.9 kg)     GEN: No acute distress  NECK: No JVD; No carotid bruits CARDIAC: Regular rate and rhythm, no murmurs, rubs, gallops RESPIRATORY:  Clear to auscultation without rales, wheezing or rhonchi  ABDOMEN: Soft, non-tender, non-distended EXTREMITIES:  No edema; No deformity   ASSESSMENT AND PLAN:    Ventricular arrhythmia  s/p [employer] S-ICD  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 MRI without evidence of infiltrative disease. ICD for secondary prevention.  euvolemic today Stable on an appropriate medical regimen Normal ICD function See Pace Art report No changes today  HTN Stable on current regimen   Paroxysmal AF No significant burden Continue eliquis  5 mg BID  H/o Anoxic brain injury Still has trouble remembering the episodes surrounding her initial hospitalization and need for ICD  Cardiac Clearance Previously cleared by gen cards The patient is at low risk to proceed without further work up.  If the patient has new chest pain or SOB prior to surgery, they should be revaluated. OK to apply magnet for surgery. OK to hold eliquis  3 days prior to surgery, resume as soon as safe after.    Disposition:   Follow up with EP Team in 12 months   Signed, Sharper Prentice Lesia, PA-C

## 2024-02-20 NOTE — Telephone Encounter (Addendum)
 Pt scheduled to see Jodie Passey PA.

## 2024-02-20 NOTE — Telephone Encounter (Signed)
 Spoke with patient and she now recalled the situation, patient confirmed that she had resumed medication.

## 2024-02-20 NOTE — Telephone Encounter (Signed)
 Patient recently saw GI specialist over the summer due to elevated liver enzymes and alkaline phos levels. Per chart review, elevation in labs was due to frequent antibiotic use. She was advised to restart statin. Due to past memory issues with anoxic brain injury I was checking to make sure she had restarted medication.

## 2024-02-20 NOTE — Patient Instructions (Signed)
 Medication Instructions:  No medication changes today. *If you need a refill on your cardiac medications before your next appointment, please call your pharmacy*  Lab Work: No labwork ordered today. If you have labs (blood work) drawn today and your tests are completely normal, you will receive your results only by: MyChart Message (if you have MyChart) OR A paper copy in the mail If you have any lab test that is abnormal or we need to change your treatment, we will call you to review the results.  Testing/Procedures: No testing ordered today  Follow-Up: At [facility], you and your health needs are our priority.  As part of our continuing mission to provide you with exceptional heart care, our providers are all part of one team.  This team includes your primary Cardiologist (physician) and Advanced Practice Providers or APPs (Physician Assistants and Nurse Practitioners) who all work together to provide you with the care you need, when you need it.  Your next appointment:   12 month(s)  Provider:   You may see Will Cortland Ding, MD or one of the following Advanced Practice Providers on your designated Care Team:   Mertha Abrahams, [place] [address] [place], PA-C Suzann Riddle, NP Creighton Doffing, NP    We recommend signing up for the patient portal called "MyChart".  Sign up information is provided on this After Visit Summary.  MyChart is used to connect with patients for Virtual Visits (Telemedicine).  Patients are able to view lab/test results, encounter notes, upcoming appointments, etc.  Non-urgent messages can be sent to your provider as well.   To learn more about what you can do with MyChart, go to [email or web address].

## 2024-02-20 NOTE — Progress Notes (Signed)
 Note has been faxed to surgeon.

## 2024-02-23 ENCOUNTER — Emergency Department (HOSPITAL_COMMUNITY)

## 2024-02-23 ENCOUNTER — Other Ambulatory Visit: Payer: Self-pay

## 2024-02-23 ENCOUNTER — Inpatient Hospital Stay (HOSPITAL_COMMUNITY)
Admission: EM | Admit: 2024-02-23 | Discharge: 2024-02-26 | DRG: 309 | Disposition: A | Discharge status: Home / Self Care | Attending: Cardiology | Admitting: Cardiology

## 2024-02-23 DIAGNOSIS — Z59868 Other specified financial insecurity: Secondary | ICD-10-CM

## 2024-02-23 DIAGNOSIS — Z885 Allergy status to narcotic agent status: Secondary | ICD-10-CM

## 2024-02-23 DIAGNOSIS — D6869 Other thrombophilia: Secondary | ICD-10-CM | POA: Diagnosis present

## 2024-02-23 DIAGNOSIS — I4729 Other ventricular tachycardia: Secondary | ICD-10-CM | POA: Diagnosis not present

## 2024-02-23 DIAGNOSIS — R112 Nausea with vomiting, unspecified: Secondary | ICD-10-CM

## 2024-02-23 DIAGNOSIS — I472 Ventricular tachycardia, unspecified: Secondary | ICD-10-CM

## 2024-02-23 DIAGNOSIS — I4891 Unspecified atrial fibrillation: Secondary | ICD-10-CM

## 2024-02-23 DIAGNOSIS — Z7901 Long term (current) use of anticoagulants: Secondary | ICD-10-CM

## 2024-02-23 DIAGNOSIS — Z888 Allergy status to other drugs, medicaments and biological substances status: Secondary | ICD-10-CM

## 2024-02-23 DIAGNOSIS — E66811 Obesity, class 1: Secondary | ICD-10-CM | POA: Diagnosis present

## 2024-02-23 DIAGNOSIS — Z7984 Long term (current) use of oral hypoglycemic drugs: Secondary | ICD-10-CM

## 2024-02-23 DIAGNOSIS — Z4502 Encounter for adjustment and management of automatic implantable cardiac defibrillator: Secondary | ICD-10-CM | POA: Diagnosis not present

## 2024-02-23 DIAGNOSIS — Z90710 Acquired absence of both cervix and uterus: Secondary | ICD-10-CM

## 2024-02-23 DIAGNOSIS — I428 Other cardiomyopathies: Secondary | ICD-10-CM | POA: Diagnosis present

## 2024-02-23 DIAGNOSIS — Z95810 Presence of automatic (implantable) cardiac defibrillator: Secondary | ICD-10-CM

## 2024-02-23 DIAGNOSIS — I110 Hypertensive heart disease with heart failure: Secondary | ICD-10-CM | POA: Diagnosis present

## 2024-02-23 DIAGNOSIS — K760 Fatty (change of) liver, not elsewhere classified: Secondary | ICD-10-CM | POA: Diagnosis present

## 2024-02-23 DIAGNOSIS — F17210 Nicotine dependence, cigarettes, uncomplicated: Secondary | ICD-10-CM | POA: Diagnosis present

## 2024-02-23 DIAGNOSIS — Z9079 Acquired absence of other genital organ(s): Secondary | ICD-10-CM

## 2024-02-23 DIAGNOSIS — J4489 Other specified chronic obstructive pulmonary disease: Secondary | ICD-10-CM | POA: Diagnosis present

## 2024-02-23 DIAGNOSIS — Z7985 Long-term (current) use of injectable non-insulin antidiabetic drugs: Secondary | ICD-10-CM

## 2024-02-23 DIAGNOSIS — R079 Chest pain, unspecified: Secondary | ICD-10-CM

## 2024-02-23 DIAGNOSIS — R519 Headache, unspecified: Secondary | ICD-10-CM | POA: Diagnosis present

## 2024-02-23 DIAGNOSIS — Z91040 Latex allergy status: Secondary | ICD-10-CM

## 2024-02-23 DIAGNOSIS — Z8249 Family history of ischemic heart disease and other diseases of the circulatory system: Secondary | ICD-10-CM

## 2024-02-23 DIAGNOSIS — Z8674 Personal history of sudden cardiac arrest: Secondary | ICD-10-CM

## 2024-02-23 DIAGNOSIS — Z6834 Body mass index (BMI) 34.0-34.9, adult: Secondary | ICD-10-CM

## 2024-02-23 DIAGNOSIS — I480 Paroxysmal atrial fibrillation: Secondary | ICD-10-CM | POA: Diagnosis not present

## 2024-02-23 DIAGNOSIS — Z79899 Other long term (current) drug therapy: Secondary | ICD-10-CM

## 2024-02-23 DIAGNOSIS — E119 Type 2 diabetes mellitus without complications: Secondary | ICD-10-CM | POA: Diagnosis present

## 2024-02-23 DIAGNOSIS — Z90722 Acquired absence of ovaries, bilateral: Secondary | ICD-10-CM

## 2024-02-23 DIAGNOSIS — K219 Gastro-esophageal reflux disease without esophagitis: Secondary | ICD-10-CM | POA: Diagnosis present

## 2024-02-23 DIAGNOSIS — I5032 Chronic diastolic (congestive) heart failure: Secondary | ICD-10-CM | POA: Diagnosis present

## 2024-02-23 DIAGNOSIS — Z833 Family history of diabetes mellitus: Secondary | ICD-10-CM

## 2024-02-23 LAB — CBC WITH DIFFERENTIAL/PLATELET
Abs Immature Granulocytes: 0.02 K/uL (ref 0.00–0.07)
Basophils Absolute: 0 K/uL (ref 0.0–0.1)
Basophils Relative: 1 %
Eosinophils Absolute: 0.1 K/uL (ref 0.0–0.5)
Eosinophils Relative: 1 %
HCT: 42.7 % (ref 36.0–46.0)
Hemoglobin: 13.1 g/dL (ref 12.0–15.0)
Immature Granulocytes: 0 %
Lymphocytes Relative: 40 %
Lymphs Abs: 2.3 K/uL (ref 0.7–4.0)
MCH: 22.4 pg — ABNORMAL LOW (ref 26.0–34.0)
MCHC: 30.7 g/dL (ref 30.0–36.0)
MCV: 73 fL — ABNORMAL LOW (ref 80.0–100.0)
Monocytes Absolute: 0.5 K/uL (ref 0.1–1.0)
Monocytes Relative: 9 %
Neutro Abs: 2.8 K/uL (ref 1.7–7.7)
Neutrophils Relative %: 49 %
Platelets: 299 K/uL (ref 150–400)
RBC: 5.85 MIL/uL — ABNORMAL HIGH (ref 3.87–5.11)
RDW: 16 % — ABNORMAL HIGH (ref 11.5–15.5)
WBC: 5.7 K/uL (ref 4.0–10.5)
nRBC: 0 % (ref 0.0–0.2)

## 2024-02-23 LAB — TROPONIN I (HIGH SENSITIVITY)
Troponin I (High Sensitivity): 7 ng/L (ref <18)
Troponin I (High Sensitivity): 9 ng/L (ref <18)

## 2024-02-23 LAB — BASIC METABOLIC PANEL WITH GFR
Anion gap: 14 (ref 5–15)
BUN: 16 mg/dL (ref 6–20)
CO2: 14 mmol/L — ABNORMAL LOW (ref 22–32)
Calcium: 9.2 mg/dL (ref 8.9–10.3)
Chloride: 110 mmol/L (ref 98–111)
Creatinine, Ser: 0.94 mg/dL (ref 0.44–1.00)
GFR, Estimated: >60 mL/min (ref >60)
Glucose, Bld: 82 mg/dL (ref 70–99)
Potassium: 4.3 mmol/L (ref 3.5–5.1)
Sodium: 138 mmol/L (ref 135–145)

## 2024-02-23 LAB — MRSA NEXT GEN BY PCR, NASAL: MRSA by PCR Next Gen: NOT DETECTED

## 2024-02-23 LAB — MAGNESIUM: Magnesium: 1.8 mg/dL (ref 1.7–2.4)

## 2024-02-23 MED ORDER — APIXABAN 5 MG PO TABS
5.0000 mg | ORAL_TABLET | Freq: Two times a day (BID) | ORAL | Status: DC
  Administered 2024-02-23: 5 mg via ORAL
  Filled 2024-02-23: qty 1

## 2024-02-23 MED ORDER — LAMOTRIGINE 100 MG PO TABS
100.0000 mg | ORAL_TABLET | Freq: Two times a day (BID) | ORAL | Status: DC
  Administered 2024-02-23 – 2024-02-26 (×6): 100 mg via ORAL
  Filled 2024-02-23 (×6): qty 1

## 2024-02-23 MED ORDER — AMIODARONE HCL IN DEXTROSE 360-4.14 MG/200ML-% IV SOLN
60.0000 mg/h | INTRAVENOUS | Status: AC
  Administered 2024-02-23 (×2): 60 mg/h via INTRAVENOUS
  Filled 2024-02-23: qty 200

## 2024-02-23 MED ORDER — GABAPENTIN 100 MG PO CAPS
100.0000 mg | ORAL_CAPSULE | Freq: Two times a day (BID) | ORAL | Status: DC
  Administered 2024-02-23 – 2024-02-26 (×6): 100 mg via ORAL
  Filled 2024-02-23 (×6): qty 1

## 2024-02-23 MED ORDER — ROSUVASTATIN CALCIUM 5 MG PO TABS
30.0000 mg | ORAL_TABLET | Freq: Every day | ORAL | Status: DC
  Administered 2024-02-24 – 2024-02-26 (×3): 30 mg via ORAL
  Filled 2024-02-23 (×3): qty 2

## 2024-02-23 MED ORDER — SODIUM CHLORIDE 0.9 % IV BOLUS
500.0000 mL | Freq: Once | INTRAVENOUS | Status: AC
  Administered 2024-02-23: 500 mL via INTRAVENOUS

## 2024-02-23 MED ORDER — ONDANSETRON HCL 4 MG/2ML IJ SOLN: 4.0000 mg | Freq: Four times a day (QID) | INTRAMUSCULAR | Status: DC | PRN

## 2024-02-23 MED ORDER — QUETIAPINE FUMARATE 50 MG PO TABS
150.0000 mg | ORAL_TABLET | Freq: Every day | ORAL | Status: DC
  Administered 2024-02-23 – 2024-02-25 (×3): 150 mg via ORAL
  Filled 2024-02-23 (×3): qty 3

## 2024-02-23 MED ORDER — RIVASTIGMINE TARTRATE 1.5 MG PO CAPS
3.0000 mg | ORAL_CAPSULE | Freq: Two times a day (BID) | ORAL | Status: DC
  Administered 2024-02-23 – 2024-02-26 (×6): 3 mg via ORAL
  Filled 2024-02-23 (×7): qty 2

## 2024-02-23 MED ORDER — SERTRALINE HCL 100 MG PO TABS
100.0000 mg | ORAL_TABLET | Freq: Every day | ORAL | Status: DC
  Administered 2024-02-24 – 2024-02-26 (×3): 100 mg via ORAL
  Filled 2024-02-23 (×3): qty 1

## 2024-02-23 MED ORDER — ACETAMINOPHEN 325 MG PO TABS
650.0000 mg | ORAL_TABLET | ORAL | Status: DC | PRN
  Administered 2024-02-23 – 2024-02-26 (×5): 650 mg via ORAL
  Filled 2024-02-23 (×5): qty 2

## 2024-02-23 MED ORDER — CARVEDILOL 6.25 MG PO TABS
6.2500 mg | ORAL_TABLET | Freq: Two times a day (BID) | ORAL | Status: DC
  Administered 2024-02-24 – 2024-02-26 (×5): 6.25 mg via ORAL
  Filled 2024-02-23 (×5): qty 1

## 2024-02-23 MED ORDER — APIXABAN 5 MG PO TABS: 5.0000 mg | ORAL_TABLET | Freq: Two times a day (BID) | ORAL | Status: DC

## 2024-02-23 MED ORDER — AMIODARONE LOAD VIA INFUSION
150.0000 mg | Freq: Once | INTRAVENOUS | Status: AC
  Administered 2024-02-23: 150 mg via INTRAVENOUS
  Filled 2024-02-23: qty 83.34

## 2024-02-23 MED ORDER — HYDROXYZINE HCL 10 MG PO TABS
10.0000 mg | ORAL_TABLET | Freq: Four times a day (QID) | ORAL | Status: DC | PRN
  Administered 2024-02-23: 10 mg via ORAL
  Filled 2024-02-23: qty 1

## 2024-02-23 MED ORDER — LINACLOTIDE 145 MCG PO CAPS
145.0000 ug | ORAL_CAPSULE | Freq: Every day | ORAL | Status: DC | PRN
  Administered 2024-02-24: 145 ug via ORAL
  Filled 2024-02-23 (×3): qty 1

## 2024-02-23 MED ORDER — LOSARTAN POTASSIUM 25 MG PO TABS
25.0000 mg | ORAL_TABLET | Freq: Every day | ORAL | Status: DC
  Administered 2024-02-24 – 2024-02-26 (×3): 25 mg via ORAL
  Filled 2024-02-23 (×3): qty 1

## 2024-02-23 MED ORDER — AMIODARONE HCL IN DEXTROSE 360-4.14 MG/200ML-% IV SOLN
30.0000 mg/h | INTRAVENOUS | Status: AC
  Administered 2024-02-23 – 2024-02-24 (×3): 30 mg/h via INTRAVENOUS
  Filled 2024-02-23 (×3): qty 200

## 2024-02-23 MED ORDER — APIXABAN 5 MG PO TABS
5.0000 mg | ORAL_TABLET | Freq: Two times a day (BID) | ORAL | Status: DC
  Administered 2024-02-24 – 2024-02-26 (×5): 5 mg via ORAL
  Filled 2024-02-23 (×5): qty 1

## 2024-02-23 MED ORDER — NITROGLYCERIN 0.4 MG SL SUBL
0.4000 mg | SUBLINGUAL_TABLET | SUBLINGUAL | Status: DC | PRN
  Administered 2024-02-25 (×2): 0.4 mg via SUBLINGUAL
  Filled 2024-02-23 (×2): qty 1

## 2024-02-23 NOTE — ED Triage Notes (Signed)
 Patient arrives via [facility] for chest pain and defib fire x3 in 3 minutes approximately. Defib was placed post cardiac arrest in 2022. Initially 190-200 SVT, self converted to ST around 130, down to NSR 90 then converted to afib RVR 130.   EMS vitals 110/60 Oxygen applied for comfort not for hypoxia. 97% on room air.   18 LAC

## 2024-02-23 NOTE — ED Provider Notes (Signed)
 [place] EMERGENCY DEPARTMENT AT [facility] Provider Note   CSN: [id number] Arrival date & time: 02/23/24  1508     Patient presents with: Chest Pain   Courtney Davies is a 48 y.o. female.   Patient is a 48 year old female who presents with defibrillator shocks.  She has a history of cardiac arrest in 2022.  She had a ICD placed at that time.  She also has a history of diabetes and anoxic brain injury.  Her last echo was in 2024 and showed an EF of 60 to 65%.  She also has a history of paroxysmal atrial fibrillation and is on Eliquis .  She said that she has been feeling bad for about 2 weeks with some nausea and vomiting after she eats.  She has some intermittent abdominal pain.  She has had some associated dizziness over the last 3 days.  She says she is dizzy when she stands up but also dizzy when she is laying down and when she moves her head around.  She has some blurry vision but no double vision.  No difficulty with her speech.  She has had some intermittent chest pain in her left side and some shortness of breath.  No cough or cold symptoms.  She is able to move everything normally.  No numbness or weakness to in her extremities other than currently she says she has numbness in both of her feet.  No known fevers.  She said that today she felt her defibrillator fire off 3 times.  She has not had that happen before.  She has been having some palpitations and feeling like her heart is racing.  EMS had noted that she initially she was in SVT rhythm at about 190-200.  She was reported to convert to sinus tach and then went into A-fib with RVR.  She says she just restarted her Eliquis  at her last cardiology visit this past week which was on November 6.  She said she was unaware that she needed to be on Eliquis  prior to that.  She is not sure if she started taking it on Thursday or Friday.       Prior to Admission medications   Medication Sig Start Date End Date Taking? Authorizing  Provider  apixaban  (ELIQUIS ) 5 MG TABS tablet Take 1 tablet (5 mg total) by mouth 2 (two) times daily. 02/20/24  Yes Lesia Ozell [name], PA-C  carvedilol  (COREG ) 6.25 MG tablet TAKE 1 TABLET(6.25 MG) BY MOUTH TWICE DAILY 11/14/23  Yes [name], [name], NP  cetirizine  (ZYRTEC ) 10 MG tablet Take 1 tablet (10 mg total) by mouth daily. 11/28/23  Yes [name], [name], NP  docusate sodium  (COLACE) 100 MG capsule Take 1 capsule (100 mg total) by mouth daily as needed. 01/22/24 01/21/25 Yes Jule Ronal [name], PA-C  doxycycline  (VIBRA -TABS) 100 MG tablet Take 1 tablet (100 mg total) by mouth 2 (two) times daily. To be taken after surgery 01/22/24  Yes Jule Ronal [name], PA-C  gabapentin  (NEURONTIN ) 100 MG capsule Take 100 mg by mouth 2 (two) times daily.   Yes [provider]  hydrOXYzine  (ATARAX ) 10 MG tablet TAKE 1 TABLET(10 MG) BY MOUTH AT BEDTIME 11/18/23  Yes [name], [name], NP  lamoTRIgine  (LAMICTAL ) 100 MG tablet Take 1 tab twice a day 01/08/24  Yes Babs Arthea [name], MD  linaclotide  (LINZESS ) 145 MCG CAPS capsule Take 145 mcg by mouth daily as needed (constipation).   Yes [provider]  losartan  (COZAAR )  25 MG tablet TAKE 1 TABLET BY MOUTH DAILY 01/21/24  Yes [name], [name], NP  metformin (FORTAMET) 500 MG (OSM) 24 hr tablet Take 500 mg by mouth in the morning and at bedtime.   Yes [provider]  methocarbamol  (ROBAXIN ) 750 MG tablet Take 1 tablet (750 mg total) by mouth 3 (three) times daily as needed. Patient taking differently: Take 750 mg by mouth at bedtime. 01/22/24  Yes Jule Ronal [name], PA-C  omeprazole (PRILOSEC) 40 MG capsule Take 40 mg by mouth in the morning and at bedtime.   Yes [provider]  ondansetron  (ZOFRAN ) 4 MG tablet Take 1 tablet (4 mg total) by mouth every 8 (eight) hours as needed for nausea or vomiting. 01/22/24  Yes Stanbery, Mary L, PA-C  OZEMPIC, 2 MG/DOSE, 8 MG/3ML SOPN Inject 2 mg into the skin once a week. 02/19/24  Yes [provider]   QUEtiapine  150 MG TABS Take 150 mg by mouth at bedtime. 01/09/24  Yes Arfeen, Leni [name], MD  rivastigmine  (EXELON ) 3 MG capsule TAKE 1 CAPSULE(3 MG) BY MOUTH TWICE DAILY 08/09/23  Yes Babs Arthea [name], MD  rosuvastatin  (CRESTOR ) 20 MG tablet Take 1.5 tablets (30 mg total) by mouth daily. 09/26/23  Yes [name], [name], NP  sertraline  (ZOLOFT ) 100 MG tablet Take 1 tablet (100 mg total) by mouth daily. 01/09/24  Yes Arfeen, Leni [name], MD  Accu-Chek Softclix Lancets lancets Check blood sugars once daily Dx code E10.9 12/26/23   [name], [name], NP  BD PEN NEEDLE NANO 2ND GEN 32G X 4 MM MISC 3 (three) times daily. as directed 07/11/21   [provider]  Blood Glucose Monitoring Suppl (ACCU-CHEK GUIDE ME) w/Device KIT 1 kit by Does not apply route as directed. Check blood sugars once daily Dx code E11.65 12/26/23   [name] BRAVO, NP    Allergies: Latex, Other, Codeine , and Nitrofurantoin    Review of Systems  Constitutional:  Positive for fatigue. Negative for chills, diaphoresis and fever.  HENT:  Negative for congestion, rhinorrhea and sneezing.   Eyes: Negative.   Respiratory:  Positive for cough and shortness of breath. Negative for chest tightness.   Cardiovascular:  Positive for chest pain. Negative for leg swelling.  Gastrointestinal:  Negative for abdominal pain, blood in stool, diarrhea, nausea and vomiting.  Genitourinary:  Negative for difficulty urinating, flank pain, frequency and hematuria.  Musculoskeletal:  Negative for arthralgias and back pain.  Skin:  Negative for rash.  Neurological:  Negative for dizziness, speech difficulty, weakness, numbness and headaches.    Updated Vital Signs BP 113/70 (BP Location: Left Arm)   Pulse 81   Temp 98.1 F (36.7 C) (Oral)   Resp 18   Ht 5' 6 (1.676 m)   Wt 95.7 kg   LMP 12/03/2012   SpO2 99%   BMI 34.05 kg/m   Physical Exam Constitutional:      Appearance: She is well-developed.  HENT:     Head: Normocephalic and atraumatic.  Eyes:      Extraocular Movements: Extraocular movements intact.     Pupils: Pupils are equal, round, and reactive to light.     Comments: No nystagmus  Cardiovascular:     Rate and Rhythm: Regular rhythm. Tachycardia present.     Heart sounds: Normal heart sounds.  Pulmonary:     Effort: Pulmonary effort is normal. No respiratory distress.     Breath sounds: Normal breath sounds. No wheezing or rales.  Chest:  Chest wall: No tenderness.  Abdominal:     General: Bowel sounds are normal.     Palpations: Abdomen is soft.     Tenderness: There is no abdominal tenderness. There is no guarding or rebound.  Musculoskeletal:        General: Normal range of motion.     Cervical back: Normal range of motion and neck supple.  Lymphadenopathy:     Cervical: No cervical adenopathy.  Skin:    General: Skin is warm and dry.     Findings: No rash.  Neurological:     General: No focal deficit present.     Mental Status: She is alert and oriented to person, place, and time.     (all labs ordered are listed, but only abnormal results are displayed) Labs Reviewed  BASIC METABOLIC PANEL WITH GFR - Abnormal; Notable for the following components:      Result Value   CO2 14 (*)    All other components within normal limits  CBC WITH DIFFERENTIAL/PLATELET - Abnormal; Notable for the following components:   RBC 5.85 (*)    MCV 73.0 (*)    MCH 22.4 (*)    RDW 16.0 (*)    All other components within normal limits  MRSA NEXT GEN BY PCR, NASAL  MAGNESIUM   I-STAT CHEM 8, ED  TROPONIN I (HIGH SENSITIVITY)  TROPONIN I (HIGH SENSITIVITY)    EKG: None  Radiology: DG Chest Port 1 View Result Date: 02/23/2024 EXAM: 1 VIEW(S) XRAY OF THE CHEST 02/23/2024 05:21:00 PM COMPARISON: 05/05/2023 CLINICAL HISTORY: chest pain FINDINGS: LINES, TUBES AND DEVICES: Generator with lead stable in position. LUNGS AND PLEURA: No focal pulmonary opacity. No pulmonary edema. No pleural effusion. No pneumothorax. HEART AND  MEDIASTINUM: Generator with lead stable in position. No acute abnormality of the cardiac and mediastinal silhouettes. BONES AND SOFT TISSUES: No acute osseous abnormality. IMPRESSION: 1. No acute cardiopulmonary process. Electronically signed by: Norman Gatlin MD 02/23/2024 05:45 PM EST RP Workstation: [id number]     Procedures   Medications Ordered in the ED  amiodarone  (NEXTERONE  PREMIX) 360-4.14 MG/200ML-% (1.8 mg/mL) IV infusion (60 mg/hr Intravenous New Bag/Given 02/23/24 1710)    Followed by  amiodarone  (NEXTERONE  PREMIX) 360-4.14 MG/200ML-% (1.8 mg/mL) IV infusion (has no administration in time range)  carvedilol  (COREG ) tablet 6.25 mg (has no administration in time range)  losartan  (COZAAR ) tablet 25 mg (has no administration in time range)  rosuvastatin  (CRESTOR ) tablet 30 mg (has no administration in time range)  QUEtiapine  (SEROQUEL ) tablet 150 mg (has no administration in time range)  hydrOXYzine  (ATARAX ) tablet 10 mg (has no administration in time range)  rivastigmine  (EXELON ) capsule 3 mg (has no administration in time range)  sertraline  (ZOLOFT ) tablet 100 mg (has no administration in time range)  apixaban  (ELIQUIS ) tablet 5 mg (has no administration in time range)  gabapentin  (NEURONTIN ) capsule 100 mg (has no administration in time range)  lamoTRIgine  (LAMICTAL ) tablet 100 mg (has no administration in time range)  linaclotide  (LINZESS ) capsule 145 mcg (has no administration in time range)  nitroGLYCERIN  (NITROSTAT ) SL tablet 0.4 mg (has no administration in time range)  acetaminophen  (TYLENOL ) tablet 650 mg (has no administration in time range)  ondansetron  (ZOFRAN ) injection 4 mg (has no administration in time range)  sodium chloride  0.9 % bolus 500 mL (0 mLs Intravenous Stopped 02/23/24 1723)  amiodarone  (NEXTERONE ) 1.8 mg/mL load via infusion 150 mg (150 mg Intravenous Bolus from Bag 02/23/24 1710)    Clinical Course as of  02/23/24 1857  Sun Feb 23, 2024  1635  Discussed with cardiology.  They will come see pt.  Do not recommend starting rate control meds until they see her [MB]    Clinical Course User Index [MB] Lenor Hollering, MD                                 Medical Decision Making Amount and/or Complexity of Data Reviewed Labs: ordered. Radiology: ordered.  Risk Decision regarding hospitalization.   This patient presents to the ED for concern of ICD firing, nausea and vomiting this involves an extensive number of treatment options, and is a complaint that carries with it a high risk of complications and morbidity.  I considered the following differential and admission for this acute, potentially life threatening condition.  The differential diagnosis includes arrhythmia, electrolyte abnormality, anemia, ACS, pulmonary edema/CHF, PE  MDM:    Patient is a 48 year old who presents with 3 defibrillations from her ICD earlier today.  She has had some nausea and vomiting for the last week or so and then has had some dizziness for the last few days.  It does have a vertiginous component although she does not have any nystagmus.  No focal neurologic deficits.  Her labs reviewed and are nonconcerning.  Chest x-ray does not show any pulmonary edema or pneumonia.  Her EKG shows atrial fibrillation with RVR.  I consulted with cardiology who has evaluated patient and recommends starting amiodarone .  She is rate controlled with this.  She was started on some IV fluids.  She will be admitted to the cardiology service.  CRITICAL CARE Performed by: Hollering Lenor Total critical care time: 60 minutes Critical care time was exclusive of separately billable procedures and treating other patients. Critical care was necessary to treat or prevent imminent or life-threatening deterioration. Critical care was time spent personally by me on the following activities: development of treatment plan with patient and/or surrogate as well as nursing, discussions with  consultants, evaluation of patient's response to treatment, examination of patient, obtaining history from patient or surrogate, ordering and performing treatments and interventions, ordering and review of laboratory studies, ordering and review of radiographic studies, pulse oximetry and re-evaluation of patient's condition.   (Labs, imaging, consults)  Labs: I Ordered, and personally interpreted labs.  The pertinent results include: Electrolytes nonconcerning, no significant anemia, troponin is normal  Imaging Studies ordered: I ordered imaging studies including chest x-ray I independently visualized and interpreted imaging. I agree with the radiologist interpretation  Additional history obtained from chart.  External records from outside source obtained and reviewed including prior notes  Cardiac Monitoring: The patient was maintained on a cardiac monitor.  If on the cardiac monitor, I personally viewed and interpreted the cardiac monitored which showed an underlying rhythm of: Atrial fibrillation with RVR  Reevaluation: After the interventions noted above, I reevaluated the patient and found that they have :improved  Social Determinants of Health:    Disposition: Admit to hospital  Co morbidities that complicate the patient evaluation  Past Medical History:  Diagnosis Date   AICD (automatic cardioverter/defibrillator) present    [employer] ICD   Anemia 09/2015   Anoxic brain injury (HCC)    Arthritis    knees (04/23/2017)   Asthma    teens; went away; came back (04/23/2017)   B12 deficiency anemia 04/23/2017   Cardiac arrest (HCC)    Chronic bronchitis (HCC)  Chronic low back pain with sciatica    Chronic lower back pain    Diabetes mellitus without complication (HCC)    Diabetes type 2, controlled (HCC)    Elevated ferritin level    Fatty liver    GERD (gastroesophageal reflux disease)    GERD with stricture    Headache    1-2/wk (04/23/2017)   History  of blood transfusion plenty   related to anemia (04/23/2017)   Hypertension    Inguinal hernia    Low back pain    Migraine    1-2/month (04/23/2017)   OA (osteoarthritis) of knee--left    Paroxysmal atrial fibrillation (HCC)    Sub-clinical -seen on device?, elevated CHADSVASC   Symptomatic anemia 04/23/2017   Vitamin B 12 deficiency      Medicines Meds ordered this encounter  Medications   sodium chloride  0.9 % bolus 500 mL   amiodarone  (NEXTERONE ) 1.8 mg/mL load via infusion 150 mg   FOLLOWED BY Linked Order Group    amiodarone  (NEXTERONE  PREMIX) 360-4.14 MG/200ML-% (1.8 mg/mL) IV infusion    amiodarone  (NEXTERONE  PREMIX) 360-4.14 MG/200ML-% (1.8 mg/mL) IV infusion   DISCONTD: apixaban  (ELIQUIS ) tablet 5 mg   DISCONTD: apixaban  (ELIQUIS ) tablet 5 mg   carvedilol  (COREG ) tablet 6.25 mg   losartan  (COZAAR ) tablet 25 mg   rosuvastatin  (CRESTOR ) tablet 30 mg   QUEtiapine  (SEROQUEL ) tablet 150 mg   hydrOXYzine  (ATARAX ) tablet 10 mg   rivastigmine  (EXELON ) capsule 3 mg   sertraline  (ZOLOFT ) tablet 100 mg   apixaban  (ELIQUIS ) tablet 5 mg   gabapentin  (NEURONTIN ) capsule 100 mg   lamoTRIgine  (LAMICTAL ) tablet 100 mg   linaclotide  (LINZESS ) capsule 145 mcg   nitroGLYCERIN  (NITROSTAT ) SL tablet 0.4 mg   acetaminophen  (TYLENOL ) tablet 650 mg   ondansetron  (ZOFRAN ) injection 4 mg    I have reviewed the patients home medicines and have made adjustments as needed  Problem List / ED Course: Problem List Items Addressed This Visit   None            Final diagnoses:  None    ED Discharge Orders     None          Lenor Hollering, MD 02/23/24 1900

## 2024-02-23 NOTE — H&P (Addendum)
 Cardiology Admission History and Physical   Patient ID: Courtney Davies MRN: [id number]; DOB: 08-Apr-1976   Admission date: 02/23/2024  PCP:  [name] BRAVO, NP   [facility] Providers Cardiologist:  Candyce Reek, MD  Electrophysiologist:  Will Gladis Norton, MD      Chief Complaint:  ICD shock  Patient Profile: Courtney Davies is a 48 y.o. 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  There was a question of possible respiratory issue contributing to cardiac arrest as patient had COVID 19 at the time.  Cardiac MRI showed no evidence of infiltrative heart disease.  ICD was eventually placed in May 2023 for secondary prevention.  Initial echocardiogram at the time of cardiac arrest showed EF 40 to 45% however EF quickly normalized on subsequent echocardiogram in October 2022.  Most recent echocardiogram obtained on 01/09/2023 showed EF 60 to 65%, normal RV, mild MR.    Patient was in her usual state of health until roughly 2 weeks ago, she has been having some nausea and vomiting at home lately.  She was standing in her kitchen today which she had 2 ICD shock.  Per EMS run sheet, initial EKG showed SVT with elevated heart rate, subsequent EKG showed a sinus tachycardia before patient converted to atrial fibrillation.  She was sent to [facility], ED for further evaluation.  While in [facility], ED, she stayed in atrial fibrillation with heart rate ranging between 100-120s.  Cardiology service consulted for ICD shock.   Past Medical History:  Diagnosis Date   AICD (automatic cardioverter/defibrillator) present    [employer] ICD    Anemia 09/2015   Anoxic brain injury (HCC)    Arthritis    knees (04/23/2017)   Asthma    teens; went away; came back (04/23/2017)   B12 deficiency anemia 04/23/2017   Cardiac arrest (HCC)    Chronic bronchitis (HCC)    Chronic low back pain with sciatica    Chronic lower back pain    Diabetes mellitus without complication (HCC)    Diabetes type 2, controlled (HCC)    Elevated ferritin level    Fatty liver    GERD (gastroesophageal reflux disease)    GERD with stricture    Headache    1-2/wk (04/23/2017)   History of blood transfusion plenty   related to anemia (04/23/2017)   Hypertension    Inguinal hernia    Low back pain    Migraine    1-2/month (04/23/2017)   OA (osteoarthritis) of knee--left    Paroxysmal atrial fibrillation (HCC)    Sub-clinical -seen on device?, elevated CHADSVASC   Symptomatic anemia 04/23/2017   Vitamin B 12 deficiency    Past Surgical History:  Procedure Laterality Date   ABDOMINAL HYSTERECTOMY  YRS AGO   COMPLETE   CESAREAN SECTION  1998; 2002   ESOPHAGEAL DILATION  02/2020   by Dr Marvis   INGUINAL HERNIA REPAIR Bilateral 1980s    total of 4 surgeries (04/23/2017)   IR GASTROSTOMY TUBE MOD SED  12/13/2020   KNEE ARTHROSCOPY WITH MEDIAL MENISECTOMY Left 01/30/2018   Procedure: LEFT KNEE ARTHROSCOPY WITH PARTIAL LATERAL MENISCECTOMY;  Surgeon: Vernetta,  Lonni GRADE, MD;  Location: [facility];  Service: Orthopedics;  Laterality: Left;   KNEE CARTILAGE SURGERY Left    SUBQ ICD IMPLANT N/A 09/13/2021   Procedure: SUBQ ICD IMPLANT;  Surgeon: [name], MD;  Location: [facility] INVASIVE CV LAB;  Service: Cardiovascular;  Laterality: N/A;   TUBAL LIGATION  2002     Medications Prior to Admission: Prior to Admission medications   Medication Sig Start Date End Date Taking? Authorizing Provider  apixaban  (ELIQUIS ) 5 MG TABS tablet Take 1 tablet (5 mg total) by mouth 2 (two) times daily. 02/20/24  Yes Lesia Ozell [name], PA-C  carvedilol   (COREG ) 6.25 MG tablet TAKE 1 TABLET(6.25 MG) BY MOUTH TWICE DAILY 11/14/23  Yes [name], [name], NP  cetirizine  (ZYRTEC ) 10 MG tablet Take 1 tablet (10 mg total) by mouth daily. 11/28/23  Yes [name], [name], NP  docusate sodium  (COLACE) 100 MG capsule Take 1 capsule (100 mg total) by mouth daily as needed. 01/22/24 01/21/25 Yes Jule Ronal [name], PA-C  doxycycline  (VIBRA -TABS) 100 MG tablet Take 1 tablet (100 mg total) by mouth 2 (two) times daily. To be taken after surgery 01/22/24  Yes Jule Ronal [name], PA-C  gabapentin  (NEURONTIN ) 100 MG capsule Take 100 mg by mouth 2 (two) times daily.   Yes [provider]  hydrOXYzine  (ATARAX ) 10 MG tablet TAKE 1 TABLET(10 MG) BY MOUTH AT BEDTIME 11/18/23  Yes [name], [name], NP  lamoTRIgine  (LAMICTAL ) 100 MG tablet Take 1 tab twice a day 01/08/24  Yes Babs Arthea [name], MD  linaclotide  (LINZESS ) 145 MCG CAPS capsule Take 145 mcg by mouth daily as needed (constipation).   Yes [provider]  losartan  (COZAAR ) 25 MG tablet TAKE 1 TABLET BY MOUTH DAILY 01/21/24  Yes [name], [name], NP  metformin (FORTAMET) 500 MG (OSM) 24 hr tablet Take 500 mg by mouth in the morning and at bedtime.   Yes [provider]  methocarbamol  (ROBAXIN ) 750 MG tablet Take 1 tablet (750 mg total) by mouth 3 (three) times daily as needed. Patient taking differently: Take 750 mg by mouth at bedtime. 01/22/24  Yes Jule Ronal [name], PA-C  omeprazole (PRILOSEC) 40 MG capsule Take 40 mg by mouth in the morning and at bedtime.   Yes [provider]  ondansetron  (ZOFRAN ) 4 MG tablet Take 1 tablet (4 mg total) by mouth every 8 (eight) hours as needed for nausea or vomiting. 01/22/24  Yes Stanbery, Mary L, PA-C  OZEMPIC, 2 MG/DOSE, 8 MG/3ML SOPN Inject 2 mg into the skin once a week. 02/19/24  Yes [provider]  QUEtiapine  150 MG TABS Take 150 mg by mouth at bedtime. 01/09/24  Yes Arfeen, Leni [name], MD  rivastigmine  (EXELON ) 3 MG capsule TAKE 1 CAPSULE(3 MG) BY MOUTH TWICE  DAILY 08/09/23  Yes Babs Arthea [name], MD  rosuvastatin  (CRESTOR ) 20 MG tablet Take 1.5 tablets (30 mg total) by mouth daily. 09/26/23  Yes [name], [name], NP  sertraline  (ZOLOFT ) 100 MG tablet Take 1 tablet (100 mg total) by mouth daily. 01/09/24  Yes Arfeen, Leni [name], MD  Accu-Chek Softclix Lancets lancets Check blood sugars once daily Dx code E10.9 12/26/23   [name], [name], NP  BD PEN NEEDLE NANO 2ND GEN 32G X 4 MM MISC 3 (three) times daily. as directed 07/11/21   [provider]  Blood Glucose Monitoring Suppl (ACCU-CHEK GUIDE ME) w/Device KIT 1 kit by Does not apply route as directed. Check blood sugars once daily Dx  code E11.65 12/26/23   [name] BRAVO, NP     Allergies:    Allergies  Allergen Reactions   Latex Hives   Other Other (See Comments), Anaphylaxis, Swelling and Hives    Hair glue causes throat to close and hives glue Hair glue causes throat to close    Codeine  Nausea And Vomiting    Was on an empty stomach.   Nitrofurantoin Rash    Social History:   Social History   Socioeconomic History   Marital status: Single    Spouse name: Not on file   Number of children: 2   Years of education: 11th   Highest education level: Not on file  Occupational History   Occupation: scientist, product/process development in restaurant  Tobacco Use   Smoking status: Every Day    Types: Cigarettes   Smokeless tobacco: Never  Vaping Use   Vaping status: Never Used  Substance and Sexual Activity   Alcohol use: Not Currently    Comment: occ   Drug use: Yes    Types: Marijuana    Comment: daily   Sexual activity: Yes    Birth control/protection: Surgical  Other Topics Concern   Not on file  Social History Narrative   Lives at home with children   Right-handed.   Social Drivers of Corporate Investment Banker Strain: High Risk (08/02/2023)   Overall Financial Resource Strain (CARDIA)    Difficulty of Paying Living Expenses: Very hard  Food Insecurity: Medium Risk (11/18/2023)   Received from [facility]   Hunger Vital Sign    Within the past 12 months, you worried that your food would run out before you got money to buy more: Never true    Within the past 12 months, the food you bought just didn't last and you didn't have money to get more. : Sometimes true  Transportation Needs: No Transportation Needs (11/18/2023)   Received from [facility]    In the past 12 months, has lack of reliable transportation kept you from medical appointments, meetings, work or from getting things needed for daily living? : No  Physical Activity: Not on file  Stress: Stress Concern Present (05/15/2023)   [employer] of Occupational Health - Occupational Stress Questionnaire    Feeling of Stress : To some extent  Social Connections: Unknown (07/04/2023)   Social Connection and Isolation Panel    Frequency of Communication with Friends and Family: Never    Frequency of Social Gatherings with Friends and Family: Never    Attends Religious Services: 1 to 4 times per year    Active Member of [employer] or Organizations: No    Attends Banker Meetings: Never    Marital Status: Patient declined  Catering Manager Violence: Not At Risk (01/08/2023)   Humiliation, Afraid, Rape, and Kick questionnaire    Fear of Current or Ex-Partner: No    Emotionally Abused: No    Physically Abused: No    Sexually Abused: No     Family History:   The patient's family history includes Anemia in her paternal grandmother; Diabetes in her father, maternal grandmother, and mother; Hypertension in her mother; Prostate cancer in her maternal uncle; Stroke in her maternal grandmother and mother; Valvular heart disease in her paternal grandmother.    ROS:  Please see the history of present illness.  All other ROS reviewed and negative.     Physical Exam/Data: Vitals:   02/23/24 1515 02/23/24 1519 02/23/24 1539  BP:   (!) 115/90  Pulse: (!) 119    Resp: (!) 22    Temp:  98.4 F (36.9 C)    TempSrc:  Oral   SpO2: 100%    Weight: 82.1 kg    Height: 5' 6 (1.676 m)     No intake or output data in the 24 hours ending 02/23/24 1700    02/23/2024    3:15 PM 02/20/2024   11:36 AM 01/29/2024    8:05 AM  Last 3 Weights  Weight (lbs) 181 lb 181 lb 193 lb 9.6 oz  Weight (kg) 82.101 kg 82.101 kg 87.816 kg     Body mass index is 29.21 kg/m.  General:  Well nourished, well developed, in no acute distress HEENT: normal Neck: no JVD Vascular: No carotid bruits; Distal pulses 2+ bilaterally   Cardiac:  normal S1, S2; RRR; no murmur  Lungs:  clear to auscultation bilaterally, no wheezing, rhonchi or rales  Abd: soft, nontender, no hepatomegaly  Ext: no edema Musculoskeletal:  No deformities, BUE and BLE strength normal and equal Skin: warm and dry  Neuro:  CNs 2-12 intact, no focal abnormalities noted Psych:  Normal affect   EKG:  The ECG that was done and was personally reviewed and demonstrates atrial fibrillation with HR 120s  Relevant CV Studies:  Coronary CT 02/02/2021 IMPRESSION: 1. Coronary calcium  score of 0.   2. Normal coronary origin with right dominance.   3. Normal coronary arteries.   RECOMMENDATIONS: 1. No evidence of CAD (0%). Consider non-atherosclerotic causes of chest pain.   Echo 01/09/2023 1. Left ventricular ejection fraction, by estimation, is 60 to 65%. The  left ventricle has normal function. The left ventricle has no regional  wall motion abnormalities. Left ventricular diastolic parameters are  indeterminate.   2. Right ventricular systolic function is normal. The right ventricular  size is normal. Tricuspid regurgitation signal is inadequate for assessing  PA pressure.   3. The mitral valve is normal in structure. Mild mitral valve  regurgitation. No evidence of mitral stenosis.   4. The aortic valve is normal in structure. Aortic valve regurgitation is  not visualized. No aortic stenosis is present.   5. The inferior vena cava is  dilated in size with >50% respiratory  variability, suggesting right atrial pressure of 8 mmHg.   Conclusion(s)/Recommendation(s): No intracardiac source of embolism  detected on this transthoracic study. Consider agitated saline contrast  injection and/or transesophageal echocardiogram to exclude cardiac source  of embolism if clinically indicated.     Laboratory Data: High Sensitivity Troponin:   Recent Labs  Lab 02/23/24 1534  TROPONINIHS 7      Chemistry Recent Labs  Lab 02/23/24 1534  NA 138  K 4.3  CL 110  CO2 14*  GLUCOSE 82  BUN 16  CREATININE 0.94  CALCIUM  9.2  GFRNONAA >60  ANIONGAP 14    No results for input(s): PROT, ALBUMIN, AST, ALT, ALKPHOS, BILITOT in the last 168 hours. Lipids No results for input(s): CHOL, TRIG, HDL, LABVLDL, LDLCALC, CHOLHDL in the last 168 hours. Hematology Recent Labs  Lab 02/23/24 1534  WBC 5.7  RBC 5.85*  HGB 13.1  HCT 42.7  MCV 73.0*  MCH 22.4*  MCHC 30.7  RDW 16.0*  PLT 299   Thyroid  No results for input(s): TSH, FREET4 in the last 168 hours. BNPNo results for input(s): BNP, PROBNP in the last 168 hours.  DDimer No results for input(s): DDIMER in the last 168 hours.  Radiology/Studies:  No results found.   Assessment and Plan: ICD shock: Patient has been seen by Dr. Kennyth who suspected she had a GI illness that led to atrial fibrillation with RVR and her ICD likely shocked her for rapid A-fib instead of true VT.  Pending device interrogation.  Will start her on IV amiodarone , given her young age, she is not a good long-term candidate for amiodarone  therapy given toxicity profile.  Will consider A-fib ablation as outpatient later.  Repeat echocardiogram Addendum: Device interrogated, both shock precipitated by afib which transitioned to wide complex tachycardia  History of VF arrest: In August 2022, occurred in the setting of COVID-19 infection  Nonischemic cardiomyopathy: Cardiac  monitor showed no infiltrative heart disease.  EF was 40 to 45% at the time of cardiac arrest.  Coronary CTA in October 2022 showed clean coronary arteries.  EF normalized on subsequent echocardiogram.  Hypeertension  DM II  Risk Assessment/Risk Scores:        CHA2DS2-VASc Score = 5   This indicates a 7.2% annual risk of stroke. The patient's score is based upon: CHF History: 0 HTN History: 1 Diabetes History: 1 Stroke History: 2 Vascular Disease History: 0 Age Score: 0 Gender Score: 1    Code Status: Full Code  Severity of Illness: The appropriate patient status for this patient is OBSERVATION. Observation status is judged to be reasonable and necessary in order to provide the required intensity of service to ensure the patient's safety. The patient's presenting symptoms, physical exam findings, and initial radiographic and laboratory data in the context of their medical condition is felt to place them at decreased risk for further clinical deterioration. Furthermore, it is anticipated that the patient will be medically stable for discharge from the hospital within 2 midnights of admission.   For questions or updates, please contact [facility] Please consult [email or web address] for contact info under     Signed, Scot Ford, PA  02/23/2024 5:00 PM   I have seen, examined the patient, and reviewed the above assessment and plan.    HPI: Courtney Davies is a 48 year old female with past medical history notable for out of hospital cardiac arrest s/p S-ICD, heart failure with recovered ejection fraction, paroxysmal atrial fibrillation who presented to the ED via EMS after receiving multiple ICD therapies at home.  She reports that for several weeks she has been having some nausea associated with vomiting.  She had intermittent epigastric discomfort.  She also endorses feeling intermittent palpitations.  She has not had any syncopal episodes.  She was standing in her kitchen  earlier today when she began feeling palpitations and received 2 shocks by her ICD.  She is quite shaken up by this event but otherwise doing relatively well at this time with no new or acute complaints.  She denies any recent lower extremity swelling or shortness of breath.  She is forgetful but states that her mother assists her with her medications.  General: Well developed, in no acute distress.  Neck: No JVD.  Cardiac: Normal rate, irregular rhythm.  Resp: Normal work of breathing.  Ext: No edema.  Neuro: No gross focal deficits.  Psych: Normal affect.   EMS Run Sheet   Assessment: Ms. Keyri Salberg is a 48 year old female with past medical history notable for out of hospital cardiac arrest s/p S-ICD, heart failure with recovered ejection fraction, paroxysmal atrial fibrillation who presented to the ED via EMS after receiving multiple ICD therapies at home.  I performed an  interrogation of her SICD while in the ED.  Review of her S-ICD interrogation showed paroxysmal atrial fibrillation with rapid ventricular rates starting today.  On 1 occasion she had tachycardia induced tachycardia with her atrial fibrillation accelerating and inducing monomorphic ventricular tachycardia for which she received an appropriate and successful shock.  Post shock, she returned to atrial fibrillation.  Heart rates then increased into the treatment zone and she received another shock, this time inappropriate.  Given presence of both AF and VT, we have opted for amiodarone  as a short-term rhythm control strategy.  She was in sinus rhythm as early as 2:34pm this afternoon, so recent interruptions in her Eliquis  should not prohibit starting amiodarone .  Given her young age, catheter ablation would be a viable long-term option for management of her atrial fibrillation.  She had not had any ventricular arrhythmias since ICD implant and I suspect that adequate control of her atrial fibrillation would prevent further  episodes.  Problem List:  ICD shock x2 Monomorphic ventricular tachycardia  Paroxysmal atrial fibrillation Hypercoagulable state due to atrial fibrillation  Plan:  - Start IV amiodarone  bolus followed by continuous infusion. - Continue Eliquis  5 mg twice daily. - Repeat echocardiogram. - Trend troponins.  Fonda Kitty, MD 02/23/2024 5:24 PM

## 2024-02-23 NOTE — ED Notes (Signed)
 XR at bedside

## 2024-02-24 ENCOUNTER — Encounter (HOSPITAL_COMMUNITY): Payer: Self-pay | Admitting: Cardiology

## 2024-02-24 ENCOUNTER — Observation Stay (HOSPITAL_COMMUNITY)

## 2024-02-24 DIAGNOSIS — Z95810 Presence of automatic (implantable) cardiac defibrillator: Secondary | ICD-10-CM | POA: Diagnosis not present

## 2024-02-24 DIAGNOSIS — D6869 Other thrombophilia: Secondary | ICD-10-CM | POA: Diagnosis present

## 2024-02-24 DIAGNOSIS — I4729 Other ventricular tachycardia: Secondary | ICD-10-CM

## 2024-02-24 DIAGNOSIS — Z8674 Personal history of sudden cardiac arrest: Secondary | ICD-10-CM | POA: Diagnosis not present

## 2024-02-24 DIAGNOSIS — Z7985 Long-term (current) use of injectable non-insulin antidiabetic drugs: Secondary | ICD-10-CM | POA: Diagnosis not present

## 2024-02-24 DIAGNOSIS — R519 Headache, unspecified: Secondary | ICD-10-CM | POA: Diagnosis present

## 2024-02-24 DIAGNOSIS — Z91040 Latex allergy status: Secondary | ICD-10-CM | POA: Diagnosis not present

## 2024-02-24 DIAGNOSIS — I480 Paroxysmal atrial fibrillation: Secondary | ICD-10-CM | POA: Diagnosis present

## 2024-02-24 DIAGNOSIS — I4891 Unspecified atrial fibrillation: Secondary | ICD-10-CM

## 2024-02-24 DIAGNOSIS — Z7901 Long term (current) use of anticoagulants: Secondary | ICD-10-CM | POA: Diagnosis not present

## 2024-02-24 DIAGNOSIS — Z888 Allergy status to other drugs, medicaments and biological substances status: Secondary | ICD-10-CM | POA: Diagnosis not present

## 2024-02-24 DIAGNOSIS — J4489 Other specified chronic obstructive pulmonary disease: Secondary | ICD-10-CM | POA: Diagnosis present

## 2024-02-24 DIAGNOSIS — Z7984 Long term (current) use of oral hypoglycemic drugs: Secondary | ICD-10-CM | POA: Diagnosis not present

## 2024-02-24 DIAGNOSIS — K760 Fatty (change of) liver, not elsewhere classified: Secondary | ICD-10-CM | POA: Diagnosis present

## 2024-02-24 DIAGNOSIS — Z8249 Family history of ischemic heart disease and other diseases of the circulatory system: Secondary | ICD-10-CM | POA: Diagnosis not present

## 2024-02-24 DIAGNOSIS — Z885 Allergy status to narcotic agent status: Secondary | ICD-10-CM | POA: Diagnosis not present

## 2024-02-24 DIAGNOSIS — E119 Type 2 diabetes mellitus without complications: Secondary | ICD-10-CM | POA: Diagnosis present

## 2024-02-24 DIAGNOSIS — Z4502 Encounter for adjustment and management of automatic implantable cardiac defibrillator: Secondary | ICD-10-CM | POA: Diagnosis not present

## 2024-02-24 DIAGNOSIS — E66811 Obesity, class 1: Secondary | ICD-10-CM | POA: Diagnosis present

## 2024-02-24 DIAGNOSIS — Z833 Family history of diabetes mellitus: Secondary | ICD-10-CM | POA: Diagnosis not present

## 2024-02-24 DIAGNOSIS — K219 Gastro-esophageal reflux disease without esophagitis: Secondary | ICD-10-CM | POA: Diagnosis present

## 2024-02-24 DIAGNOSIS — I110 Hypertensive heart disease with heart failure: Secondary | ICD-10-CM | POA: Diagnosis present

## 2024-02-24 DIAGNOSIS — F17210 Nicotine dependence, cigarettes, uncomplicated: Secondary | ICD-10-CM | POA: Diagnosis present

## 2024-02-24 DIAGNOSIS — Z79899 Other long term (current) drug therapy: Secondary | ICD-10-CM | POA: Diagnosis not present

## 2024-02-24 DIAGNOSIS — I428 Other cardiomyopathies: Secondary | ICD-10-CM | POA: Diagnosis present

## 2024-02-24 DIAGNOSIS — I5032 Chronic diastolic (congestive) heart failure: Secondary | ICD-10-CM | POA: Diagnosis present

## 2024-02-24 LAB — ECHOCARDIOGRAM COMPLETE
Area-P 1/2: 2.43 cm2
Height: 66 in
S' Lateral: 3.3 cm
Weight: 3375.68 [oz_av]

## 2024-02-24 MED ORDER — MAGNESIUM SULFATE 2 GM/50ML IV SOLN
2.0000 g | Freq: Once | INTRAVENOUS | Status: AC
  Administered 2024-02-24: 2 g via INTRAVENOUS
  Filled 2024-02-24: qty 50

## 2024-02-24 MED ORDER — AMIODARONE HCL 200 MG PO TABS
400.0000 mg | ORAL_TABLET | Freq: Two times a day (BID) | ORAL | Status: DC
  Administered 2024-02-24 – 2024-02-26 (×4): 400 mg via ORAL
  Filled 2024-02-24 (×4): qty 2

## 2024-02-24 MED ORDER — TRAMADOL HCL 50 MG PO TABS
50.0000 mg | ORAL_TABLET | Freq: Four times a day (QID) | ORAL | Status: DC | PRN
  Administered 2024-02-24 (×3): 50 mg via ORAL
  Filled 2024-02-24 (×3): qty 1

## 2024-02-24 NOTE — Progress Notes (Signed)
  Echocardiogram 2D Echocardiogram has been performed.  Courtney Davies 02/24/2024, 9:23 AM

## 2024-02-24 NOTE — TOC Initial Note (Signed)
 Transition of Care [facility]) - Initial/Assessment Note    Patient Details  Name: Courtney Davies MRN: [id number] Date of Birth: [birth year]  [facility] of [facility]) CM/SW Contact:    Lauraine FORBES Saa, LCSWA Phone Number: 02/24/2024, 3:37 PM  Clinical Narrative:                  3:37 PM CSW introduced self and role to patient. Patient confirmed she resides at home alone where she does not use DME but has family assist her in her home. Patient informed CSW that she uses insurance benefits for transportation to appointments. Patient confirmed that she does not have SNF/HH/DME history. Patient confirmed CIR history with [facility]. Per chart review, patient has a PCP and insurance. Patient's preferred pharmacy is [facility] [phone] [facility]. Patient expressed interest in PT/OT orders. CSW made medical team aware. No TOC needs identified at this time. TOC will continue to follow.  Expected Discharge Plan: Home/Self Care Barriers to Discharge: Continued Medical Work up   Patient Goals and CMS Choice            Expected Discharge Plan and Services       Living arrangements for the past 2 months: Single Family Home                                      Prior Living Arrangements/Services Living arrangements for the past 2 months: Single Family Home Lives with:: Self Patient language and need for interpreter reviewed:: Yes        Need for Family Participation in Patient Care: No (Comment) Care giver support system in place?: Yes (comment)   Criminal Activity/Legal Involvement Pertinent to Current Situation/Hospitalization: No - Comment as needed  Activities of Daily Living   ADL Screening (condition at time of admission) Independently performs ADLs?: Yes (appropriate for developmental age) Is the patient deaf or have difficulty hearing?: No Does the patient have difficulty seeing, even when wearing glasses/contacts?: No Does the patient have difficulty concentrating,  remembering, or making decisions?: No  Permission Sought/Granted Permission sought to share information with : Family Supports Permission granted to share information with : No (Contact information on chart)  Share Information with NAME: Lowery Lenis     Permission granted to share info w Relationship: Father  Permission granted to share info w Contact Information: [phone]  Emotional Assessment Appearance:: Appears stated age Attitude/Demeanor/Rapport: Engaged Affect (typically observed): Accepting, Appropriate, Adaptable, Calm, Stable, Pleasant Orientation: : Oriented to Self, Oriented to Place, Oriented to  Time, Oriented to Situation Alcohol / Substance Use: Not Applicable Psych Involvement: No (comment)  Admission diagnosis:  ICD (implantable cardioverter-defibrillator) discharge [Z45.02] Patient Active Problem List   Diagnosis Date Noted   ICD (implantable cardioverter-defibrillator) discharge 02/23/2024   Sleep disorder 06/05/2023   Neurocognitive deficits 05/22/2023   Cannabis use disorder 05/22/2023   Stroke (HCC) 01/08/2023   Mild intermittent asthma without complication 02/09/2022   Vitamin B12 deficiency 02/09/2022   MDD (major depressive disorder), single episode, moderate (HCC) 11/08/2021   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   Controlled type 2 diabetes mellitus with hyperglycemia (HCC)    Essential hypertension    Anoxic brain injury (HCC) 01/02/2021   Abdominal wall cellulitis 12/20/2020   Dysphagia    Goals of care, counseling/discussion    Acute systolic heart failure (HCC)    Encephalopathy acute    Type 1 diabetes mellitus without  complication 12/02/2020   Hypertension 12/02/2020   GERD (gastroesophageal reflux disease) 12/02/2020   Anemia 12/02/2020   Cardiac arrest (HCC) 12/01/2020   Unilateral primary osteoarthritis, left knee 12/01/2018   Lateral meniscus, posterior horn derangement, left 05/14/2018   Status post  arthroscopy of left knee 05/14/2018   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/19/2017   Anemia 04/23/2017   Acute bronchitis 04/23/2017   Chronic back pain 10/03/2015   Symptomatic anemia 04/10/2014   Bilateral edema of lower extremity    Low back pain with left-sided sciatica    PCP:  [name] BRAVO, NP Pharmacy:   [facility] [phone] - [place], [place] - [address] [place] [address] Phone: [phone] Fax: [phone]     Social Drivers of Health (SDOH) Social History: SDOH Screenings   Food Insecurity: Medium Risk (11/18/2023)   Received from [facility]  Housing: Low Risk  (11/18/2023)   Received from [facility]  Transportation Needs: No Transportation Needs (11/18/2023)   Received from [facility]  Utilities: Low Risk  (11/18/2023)   Received from [facility]  Depression (PHQ2-9): Low Risk  (01/08/2024)  Recent Concern: Depression (PHQ2-9) - High Risk (12/19/2023)  Financial Resource Strain: High Risk (08/02/2023)  Social Connections: Unknown (07/04/2023)  Stress: Stress Concern Present (05/15/2023)  Tobacco Use: High Risk (02/20/2024)   SDOH Interventions:     Readmission Risk Interventions     No data to display

## 2024-02-24 NOTE — Care Management Obs Status (Signed)
 MEDICARE OBSERVATION STATUS NOTIFICATION   Patient Details  Name: Courtney Davies MRN: [id number] Date of Birth: [birth year]   Medicare Observation Status Notification Given:  Yes  Verbally reviewed observation notice with Lennette Moats telephonically at [phone].  Will deliver to the patients room.  Airika Alkhatib 02/24/2024, 12:12 PM

## 2024-02-24 NOTE — Plan of Care (Signed)
   Problem: Education: Goal: Knowledge of General Education information will improve Description: Including pain rating scale, medication(s)/side effects and non-pharmacologic comfort measures Outcome: Progressing   Problem: Health Behavior/Discharge Planning: Goal: Ability to manage health-related needs will improve Outcome: Progressing   Problem: Nutrition: Goal: Adequate nutrition will be maintained Outcome: Progressing

## 2024-02-24 NOTE — Plan of Care (Signed)
   Problem: Education: Goal: Knowledge of General Education information will improve Description: Including pain rating scale, medication(s)/side effects and non-pharmacologic comfort measures Outcome: Progressing   Problem: Activity: Goal: Risk for activity intolerance will decrease Outcome: Progressing   Problem: Nutrition: Goal: Adequate nutrition will be maintained Outcome: Progressing

## 2024-02-24 NOTE — Progress Notes (Addendum)
 Patient Name: Courtney Davies Date of Encounter: 02/24/2024  Primary Cardiologist: Candyce Reek, MD Electrophysiologist: Shahmeer Bunn Gladis Norton, MD  Interval Summary   The patient reports her chest is sore post shock.   At this time, the patient denies shortness of breath, or any new concerns.  Vital Signs    Vitals:   02/23/24 1815 02/23/24 1910 02/24/24 0410 02/24/24 0700  BP: 113/70 126/86 (!) 115/58 123/76  Pulse: 81 74 68 65  Resp: 18 16 16 20   Temp: 98.1 F (36.7 C) 98.2 F (36.8 C) 98.1 F (36.7 C) 97.7 F (36.5 C)  TempSrc: Oral Oral Oral Oral  SpO2: 99% 100% 97% 98%  Weight: 95.7 kg     Height: 5' 6 (1.676 m)      No intake or output data in the 24 hours ending 02/24/24 0859 Filed Weights   02/23/24 1515 02/23/24 1815  Weight: 82.1 kg 95.7 kg    Physical Exam    GEN- NAD, Alert and oriented  Lungs- Clear to ausculation bilaterally, normal work of breathing Cardiac- Regular rate and rhythm, no murmurs, rubs or gallops GI- soft, NT, ND, + BS Extremities- no clubbing or cyanosis. No edema  Telemetry    AF 70-80's, converted to NSR ~ 1900 on 11/9 (personally reviewed)  Hospital Course    Courtney Davies is a 48 y.o. 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 Prior to admit, she had 2 weeks of nausea, vomiting & epigastric discomfort.  There were concerns initially for SVT (AF) and subsequent tachy induced MMVT with appropriate shock. Post shock, she returned to AF.  She was started on amiodarone  infusion.  She converted to NSR on 02/23/24 ~ 1900.    Assessment & Plan    MMVT  ICD Shock x2  -continue amiodarone  infusion through this evening > plan for transition to PO and monitor overnight  -tele monitoring  -await ECHO  -troponin negative  -follow electrolytes, goal K+ =/>4, Mg+  =/>2  Paroxysmal Atrial Fibrillation  -amiodarone  as above  -OAC for  stroke prophylaxis  -tele monitoring   Secondary Hypercoagulable State  -Eliquis  5mg  BID, appropriate dosing by age / wt         For questions or updates, please contact [facility] Please consult [email or web address] for contact info under     Signed, Daphne Barrack, NP-C, AGACNP-BC  [facility] - Elect[facility]iology  02/24/2024, 9:00 AM    Courtney Davies was seen by me today along with Daphne Barrack. I have personally performed an evaluation on this patient.  My findings are as follows: 48 y.o. female feeling well today.  No acute complaints..  Data: EKG(s) and pertinent labs, studies, etc were personally reviewed and interpreted by me:  Telemetry, EKG Otherwise, I agree with data as outlined by the advanced practice provider.  Exam performed by me: Gen: No acute distress Neck: No JVD Cardiac: Regular rhythm Lungs: Normal work of breathing Extremities: No edema  My Assessment and Plan:  1.  Monomorphic ventricular tachycardia: Occurred in the setting of rapid atrial fibrillation.  No further ventricular tachycardia.  On amiodarone .  Courtney Davies continue amiodarone  for 24 hours and transition to p.o. tonight.  2.  Paroxysmal atrial fibrillation: Occurred around the time of ICD shock.  Potentially triggering monomorphic VT.  On amiodarone  and has converted to sinus rhythm.  3.  Secondary hypercoagulable state: On Eliquis   Signed,  Courtney Demarais Gladis Norton, MD  02/24/2024 10:08 AM

## 2024-02-24 NOTE — Progress Notes (Signed)
 02/24/2024 5:33 PM -----------------------------------------------------------[facility]--------------------------------------------------- --------------------------------------------------------D(Data) A(Action) R(response) Note------------------------------------------------  Patient Name: Courtney Davies Patient DOB: 02/27/1976      Data: Received EPIC Secure Chat from SW stating pt. Interested in having PT/OT evaluations.    Action: EPIC Secure Chat and Amion page to MD/PA.    Response:  Orders placed for PT/OT evaluation per request.     Brexton Sofia, RN [facility]

## 2024-02-24 NOTE — Discharge Summary (Incomplete)
 ELECTROPHYSIOLOGY DISCHARGE SUMMARY    Patient ID: [name],  MRN: [id number], DOB/AGE: 17-Sep-1975 48 y.o.  Admit date: 02/23/2024 Discharge date: 02/26/2024  Primary Care Physician: [name] BRAVO, NP  Primary Cardiologist: Candyce Reek, MD  Electrophysiologist: Dr. Inocencio   Primary Discharge Diagnosis:  Monomorphic VT s/p ICD Shock x2   Secondary Discharge Diagnosis:  S-ICD  Paroxysmal Atrial Fibrillation  Secondary Hypercoagulable State  High Risk Medication: Amiodarone     Procedures This Admission:  None      Brief HPI: Courtney Davies is a 48 y.o. 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   Hospital Course:  The patient was admitted 02/23/24 with approximately 2 week history of nausea, vomiting and epigastric discomfort. She called EMS for ICD shock. Initial rhythms notable for SVT (AF) and subsequent tachy induced tachy with MMVT. Device interrogation showed both shocks precipitated by AF which transitioned to a wide complex tachycardia. She was loaded with IV amiodarone  for both atrial and ventricular arrhythmia.  She converted to NSR on 02/23/24 ~ 1900.  She was monitored on telemetry which demonstrated NSR with rates 60-70's.  She had complaints of headache during admit and was treated with tylenol  and ibuprofen . She also had chest soreness from the shock. She was evaluated by PT and recommended for home health PT and 3:1 chair.  The patient was examined and considered to be stable for discharge.  The patient will be seen back by EP APP in 4 weeks for post hospital care.   Physical Exam: Vitals:   02/25/24 1939 02/25/24 2240 02/26/24 0552 02/26/24 0757  BP: 121/73 (!) 151/86 (!) 157/102 (!) 145/81  Pulse: 67 63 67 67  Resp: 16 12 18 14   Temp: 97.9 F (36.6 C) 98.2 F (36.8 C) 97.9 F (36.6 C) 98 F (36.7 C)  TempSrc: Oral Oral Oral Oral  SpO2: 98%  97% 98% 97%  Weight:      Height:        GEN- NAD. A&O x 3.  HEENT: Normocephalic, atraumatic Lungs- CTAB, Normal effort.  Heart- RRR, No M/G/R.  GI- Soft, NT, ND.  Extremities- No clubbing, cyanosis, or edema    Discharge Medications:  Allergies as of 02/26/2024       Reactions   Latex Hives   Other Other (See Comments), Anaphylaxis, Swelling, Hives   Hair glue causes throat to close and hives glue Hair glue causes throat to close   Codeine  Nausea And Vomiting   Was on an empty stomach.   Nitrofurantoin Rash        Medication List     TAKE these medications    Accu-Chek Guide Me w/Device Kit 1 kit by Does not apply route as directed. Check blood sugars once daily Dx code E11.65   Accu-Chek Softclix Lancets lancets Check blood sugars once daily Dx code E10.9   acetaminophen  325 MG tablet Commonly known as: TYLENOL  Take 2 tablets (650 mg total) by mouth every 6 (six) hours as needed for headache or mild pain (pain score 1-3).   apixaban  5 MG Tabs tablet Commonly known as: Eliquis  Take 1 tablet (5 mg total) by mouth 2 (two) times daily.   BD Pen Needle Nano 2nd Gen 32G X 4 MM Misc Generic drug: Insulin  Pen Needle 3 (three) times daily. as directed   carvedilol  6.25 MG tablet Commonly known as: COREG  TAKE  1 TABLET(6.25 MG) BY MOUTH TWICE DAILY   cetirizine  10 MG tablet Commonly known as: ZYRTEC  Take 1 tablet (10 mg total) by mouth daily.   docusate sodium  100 MG capsule Commonly known as: Colace Take 1 capsule (100 mg total) by mouth daily as needed.   doxycycline  100 MG tablet Commonly known as: VIBRA -TABS Take 1 tablet (100 mg total) by mouth 2 (two) times daily. To be taken after surgery   gabapentin  100 MG capsule Commonly known as: NEURONTIN  Take 100 mg by mouth 2 (two) times daily.   hydrOXYzine  10 MG tablet Commonly known as: ATARAX  TAKE 1 TABLET(10 MG) BY MOUTH AT BEDTIME   lamoTRIgine  100 MG tablet Commonly known as: LaMICtal  Take  1 tab twice a day   Linzess  145 MCG Caps capsule Generic drug: linaclotide  Take 145 mcg by mouth daily as needed (constipation).   losartan  25 MG tablet Commonly known as: COZAAR  TAKE 1 TABLET BY MOUTH DAILY   metformin 500 MG (OSM) 24 hr tablet Commonly known as: FORTAMET Take 500 mg by mouth in the morning and at bedtime.   methocarbamol  750 MG tablet Commonly known as: ROBAXIN  Take 1 tablet (750 mg total) by mouth 3 (three) times daily as needed. What changed: when to take this   omeprazole 40 MG capsule Commonly known as: PRILOSEC Take 40 mg by mouth in the morning and at bedtime.   ondansetron  4 MG tablet Commonly known as: Zofran  Take 1 tablet (4 mg total) by mouth every 8 (eight) hours as needed for nausea or vomiting.   Ozempic (2 MG/DOSE) 8 MG/3ML Sopn Generic drug: Semaglutide (2 MG/DOSE) Inject 2 mg into the skin once a week.   QUEtiapine  Fumarate 150 MG Tabs Take 150 mg by mouth at bedtime.   rivastigmine  3 MG capsule Commonly known as: EXELON  TAKE 1 CAPSULE(3 MG) BY MOUTH TWICE DAILY   rosuvastatin  20 MG tablet Commonly known as: Crestor  Take 1.5 tablets (30 mg total) by mouth daily.   sertraline  100 MG tablet Commonly known as: ZOLOFT  Take 1 tablet (100 mg total) by mouth daily.        Disposition: Home with usual follow up as in AVS.  Follow up arranged and will need Pre-Op  clearance added to that visit.   Duration of Discharge Encounter:  APP time: 32 minutes  Signed, Daphne Barrack, NP-C, AGACNP-BC [facility] - Electrophysiology  02/26/2024, 10:41 AM    Courtney Davies was seen by me today along with Daphne Barrack. I have personally performed an evaluation on this patient.  My findings are as follows: 48 y.o. female presented to the hospital with ICD shock.  She was found to be in rapid atrial fibrillation which appears to have triggered her ventricular tachycardia.  She has been started on amiodarone .  She has remained in  sinus rhythm and feels well other than headaches.  Data: EKG(s) and pertinent labs, studies, etc were personally reviewed and interpreted by me:  Telemetry, labs Otherwise, I agree with data as outlined by the advanced practice provider.  Exam performed by me: Gen: No acute distress Neck: No JVD Cardiac: Regular Lungs: Normal work of breathing Extremities: No edema  My Assessment and Plan: 1.  Ventricular tachycardia: No further episodes.  Being loaded on amiodarone .  Has been switched to p.o.  VT occurred in the context of atrial fibrillation, potentially triggering her VT.  2.  Paroxysmal atrial fibrillation: Has been started on amiodarone .  Remains in sinus rhythm.  3.  Secondary hypercoagulable state: Continue anticoagulation  Plan for discharge today.  Plan for follow-up in clinic.  MD time at discharge: 35 minutes  Signed,  Will Gladis Norton, MD  02/26/2024 12:50 PM

## 2024-02-24 NOTE — Progress Notes (Signed)
   Received page from social worker that patient was requesting PT/ OT evaluation. Will place order.  Finbar Nippert E Tyriek Hofman, PA-C 02/24/2024 5:20 PM

## 2024-02-25 ENCOUNTER — Ambulatory Visit: Payer: Self-pay | Admitting: Cardiology

## 2024-02-25 DIAGNOSIS — Z4502 Encounter for adjustment and management of automatic implantable cardiac defibrillator: Secondary | ICD-10-CM | POA: Diagnosis not present

## 2024-02-25 DIAGNOSIS — I480 Paroxysmal atrial fibrillation: Secondary | ICD-10-CM | POA: Diagnosis not present

## 2024-02-25 DIAGNOSIS — I4729 Other ventricular tachycardia: Secondary | ICD-10-CM | POA: Diagnosis not present

## 2024-02-25 MED ORDER — IBUPROFEN 200 MG PO TABS
400.0000 mg | ORAL_TABLET | Freq: Once | ORAL | Status: AC
  Administered 2024-02-25: 400 mg via ORAL
  Filled 2024-02-25: qty 2

## 2024-02-25 MED ORDER — ACETAMINOPHEN 325 MG PO TABS
325.0000 mg | ORAL_TABLET | Freq: Once | ORAL | Status: AC
  Administered 2024-02-25: 325 mg via ORAL
  Filled 2024-02-25: qty 1

## 2024-02-25 MED ORDER — IBUPROFEN 200 MG PO TABS
400.0000 mg | ORAL_TABLET | Freq: Three times a day (TID) | ORAL | Status: DC | PRN
Start: 2024-02-25 — End: 2024-02-26
  Administered 2024-02-25: 400 mg via ORAL
  Filled 2024-02-25: qty 2

## 2024-02-25 MED ORDER — MAGNESIUM HYDROXIDE 400 MG/5ML PO SUSP
15.0000 mL | Freq: Every day | ORAL | Status: DC | PRN
  Filled 2024-02-25: qty 30

## 2024-02-25 NOTE — Evaluation (Signed)
 Occupational Therapy Evaluation Patient Details Name: Courtney Davies MRN: [id number] DOB: 12/09/1975 Today's Date: 02/25/2024   History of Present Illness   Pt is a 48 y/o female presenting after ICD shock x 2 at home. Found to be in a fib. PMH: anoxic brain injury after Vfib cardiac arrest in 2022 with ICD placement , hypertension, migraines, LBP, asthma, anxiety, COVID and type 2 diabetes     Clinical Impressions PTA, pt lives alone, reports Independent with ADLs, intermittent B cane use for mobility and has daily aide assist for IADLs. Pt reports a hx of falls. Pt received after PT eval, able to mobilize in hallway using RW w/ overall Supervision. Pt requires no more than Supervision for ADLs as well. Encouraged RW use to maximize stability, also discussed shower chair use (unclear if pt has one or not at home). Will follow acutely but anticipate no OT needs upon DC as pt reports aide assist at home.      If plan is discharge home, recommend the following:   A little help with bathing/dressing/bathroom;Direct supervision/assist for medications management     Functional Status Assessment   Patient has had a recent decline in their functional status and demonstrates the ability to make significant improvements in function in a reasonable and predictable amount of time.     Equipment Recommendations   Other (comment) (RW; shower chair if does not already have one - need to confirm)     Recommendations for Other Services         Precautions/Restrictions   Precautions Precautions: Fall Restrictions Weight Bearing Restrictions Per Provider Order: No     Mobility Bed Mobility               General bed mobility comments: left EOB    Transfers Overall transfer level: Needs assistance Equipment used: Rolling walker (2 wheels) Transfers: Sit to/from Stand Sit to Stand: Supervision                  Balance Overall balance assessment: Mild deficits  observed, not formally tested                                         ADL either performed or assessed with clinical judgement   ADL Overall ADL's : Needs assistance/impaired Eating/Feeding: Independent   Grooming: Supervision/safety;Standing   Upper Body Bathing: Set up;Sitting   Lower Body Bathing: Supervison/ safety;Sitting/lateral leans;Sit to/from stand   Upper Body Dressing : Set up;Sitting   Lower Body Dressing: Supervision/safety;Sitting/lateral leans;Sit to/from stand   Toilet Transfer: Supervision/safety;Ambulation;Rolling walker (2 wheels)   Toileting- Clothing Manipulation and Hygiene: Supervision/safety;Sit to/from stand;Sitting/lateral lean       Functional mobility during ADLs: Supervision/safety;Rolling walker (2 wheels) General ADL Comments: received walking in hallway with PT with OT taking over for session. Pt stable when using RW, reported need for ADL completion but when offered to assist, declined d/t headache. Discussed falls at home, asking aide to assist with showers if concerned with falls and use of shower chair (pt reports to PT that she has one but reported to OT the opposite)     Vision Ability to See in Adequate Light: 0 Adequate Patient Visual Report: No change from baseline Vision Assessment?: No apparent visual deficits     Perception         Praxis         Pertinent Vitals/Pain  Pain Assessment Pain Assessment: Faces Faces Pain Scale: Hurts little more Pain Location: headache Pain Descriptors / Indicators: Headache Pain Intervention(s): Monitored during session, Limited activity within patient's tolerance     Extremity/Trunk Assessment Upper Extremity Assessment Upper Extremity Assessment: Overall WFL for tasks assessed;Right hand dominant   Lower Extremity Assessment Lower Extremity Assessment: Defer to PT evaluation   Cervical / Trunk Assessment Cervical / Trunk Assessment: Normal   Communication  Communication Communication: No apparent difficulties   Cognition Arousal: Alert Behavior During Therapy: Lability Cognition: No family/caregiver present to determine baseline, History of cognitive impairments             OT - Cognition Comments: hx of ABI, distractible, not receptive to education provided on solutions to reported issues. laughing appropriately at times                 Following commands: Impaired Following commands impaired: Only follows one step commands consistently     Cueing  General Comments   Cueing Techniques: Verbal cues;Gestural cues      Exercises     Shoulder Instructions      Home Living Family/patient expects to be discharged to:: Private residence Living Arrangements: Alone Available Help at Discharge: Family;Personal care attendant;Available PRN/intermittently Type of Home: House Home Access: Stairs to enter [employer] of Steps: 3-4 Entrance Stairs-Rails: Right;Left Home Layout: One level     Bathroom Shower/Tub: Chief Strategy Officer: Standard     Home Equipment: Other (comment) (canes vs hiking poles. pt reported to PT that she has a shower chair but told this OT that she does not have one)          Prior Functioning/Environment Prior Level of Function : History of Falls (last six months);Patient poor historian/Family not available             Mobility Comments: States she occasionally using bil canes but having falls. ADLs Comments: Bath/dress self, states mom takes her shopping. reports having aide assist daily for IADLs but does not help with ADLs    OT Problem List: Decreased activity tolerance;Impaired balance (sitting and/or standing);Decreased cognition;Decreased safety awareness;Decreased knowledge of use of DME or AE   OT Treatment/Interventions: Self-care/ADL training;Therapeutic exercise;Energy conservation;DME and/or AE instruction;Therapeutic activities;Patient/family  education;Balance training      OT Goals(Current goals can be found in the care plan section)   Acute Rehab OT Goals Patient Stated Goal: resolve headache, prevent falls OT Goal Formulation: With patient Time For Goal Achievement: 03/10/24 Potential to Achieve Goals: Good ADL Goals Pt Will Transfer to Toilet: with modified independence;ambulating Pt Will Perform Tub/Shower Transfer: Tub transfer;with set-up;ambulating Additional ADL Goal #1: Pt to demo ability to gather ADL/IADL items MOD I without LOB or safety concerns Additional ADL Goal #2: Pt to increase standing activity tolerance > 10 min during ADLs/mobility without need for seated rest break   OT Frequency:  Min 1X/week    Co-evaluation              AM-PAC OT 6 Clicks Daily Activity     Outcome Measure Help from another person eating meals?: None Help from another person taking care of personal grooming?: A Little Help from another person toileting, which includes using toliet, bedpan, or urinal?: A Little Help from another person bathing (including washing, rinsing, drying)?: A Little Help from another person to put on and taking off regular upper body clothing?: A Little Help from another person to put on and taking off regular lower body  clothing?: A Little 6 Click Score: 19   End of Session Equipment Utilized During Treatment: Rolling walker (2 wheels) Nurse Communication: Mobility status  Activity Tolerance: Patient tolerated treatment well Patient left: in bed;with call bell/phone within reach  OT Visit Diagnosis: Unsteadiness on feet (R26.81);Other symptoms and signs involving cognitive function                Time: 9171-9159 OT Time Calculation (min): 12 min Charges:  OT General Charges $OT Visit: 1 Visit OT Evaluation $OT Eval Low Complexity: 1 Low  Mliss NOVAK, OTR/L Acute Rehab Services Office: [phone]   Mliss Fish 02/25/2024, 10:20 AM

## 2024-02-25 NOTE — Progress Notes (Addendum)
 Patient Name: Courtney Davies Date of Encounter: 02/25/2024  Primary Cardiologist: Candyce Reek, MD Electrophysiologist: Azaliyah Kennard Gladis Norton, MD  Interval Summary   The patient reports she has had a headache all day.  She has not been eating as she feels the food is bad. She reports she felt some better after her mom brought her food.   At this time, the patient denies chest pain, shortness of breath, or any new concerns.  Vital Signs    Vitals:   02/25/24 0410 02/25/24 0803 02/25/24 1235 02/25/24 1531  BP: 128/69 109/80 100/72 128/61  Pulse: 60 84 69 66  Resp: 20 14 18 16   Temp: 97.7 F (36.5 C) 98.1 F (36.7 C) 97.9 F (36.6 C) 97.8 F (36.6 C)  TempSrc: Oral Oral Oral Oral  SpO2: 96% 98% 100% 96%  Weight:      Height:        Intake/Output Summary (Last 24 hours) at 02/25/2024 1609 Last data filed at 02/24/2024 1943 Gross per 24 hour  Intake 665.65 ml  Output --  Net 665.65 ml   Filed Weights   02/23/24 1515 02/23/24 1815  Weight: 82.1 kg 95.7 kg    Physical Exam    GEN- NAD, Alert and oriented  Lungs- Clear to ausculation bilaterally, normal work of breathing Cardiac- Regular rate and rhythm, no murmurs, rubs or gallops GI- soft, NT, ND, + BS Extremities- no clubbing or cyanosis. No edema  Telemetry    SR 60-80's (personally reviewed)  Hospital Course    Courtney Davies is a 48 y.o. 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 Prior to admit, she had 2 weeks of nausea, vomiting & epigastric discomfort. There were concerns initially for SVT (AF) and subsequent tachy induced MMVT with appropriate shock. Post shock, she returned to AF. She was started on amiodarone  infusion. She converted to NSR on 02/23/24 ~ 1900.   Assessment & Plan    MMVT  ICD Shock x2  LVEF 55-60%, no RWMA on ECHO 02/24/24 -amiodarone  400 mg BID x 14 days, then decrease to 200 mg  daily  -tele monitoring  -plan for f/u in EP Clinic in one month   Paroxysmal Atrial Fibrillation  -amiodarone  as above  -OAC for stroke prophylaxis   Secondary Hypercoagulable State  -continue 5mg  BID, dose reviewed and appropriate by age / wt   Headache  -pt encouraged to eat, stay hydrated  -PRN tylenol   -Janece Laidlaw add PRN ibuprofen  as well -ambulate in hall       For questions or updates, please contact [facility] Please consult [email or web address] for contact info under     Signed, Daphne Barrack, NP-C, AGACNP-BC  [facility] - Electrophysiology  02/25/2024, 4:10 PM    Lennette [name] Moats was seen by me today along with Daphne Barrack. I have personally performed an evaluation on this patient.  My findings are as follows: 48 y.o. female no further arrhythmia.  No acute complaints at this time other than headache and continued musculoskeletal chest pain.  Data: EKG(s) and pertinent labs, studies, etc were personally reviewed and interpreted by me:  Telemetry Otherwise, I agree with data as outlined by the advanced practice provider.  Exam performed by me: Gen: No acute distress Neck: No JVD Cardiac: Regular Lungs: Normal work Extremities: No edema  My Assessment and Plan:  1.  Monomorphic VT: Had S ICD shock.  This was likely instigated by rapid atrial fibrillation.  On Amio.  2.  Paroxysmal atrial fibrillation: Occurred prior to ICD shock and ventricular arrhythmias.  Continue amiodarone .  3.  Secondary hypercoagulable state: On Eliquis   4.  Headaches: Encouraged to eat and stay hydrated.  Has as needed Tylenol  and ibuprofen .  Likely discharge tomorrow.  5.  Class I obesity: Lifestyle modification encouraged  Signed,  Lorilei Horan Gladis Norton, MD  02/25/2024 4:25 PM

## 2024-02-25 NOTE — Progress Notes (Signed)
 Saw patient for discharge teaching and she states she is not able to go home because she has a headache.  She has had tylenol .  Unfortunately, has not eaten as she feels the food is terrible.  Her mother is bringing lunch for her.  Will reassess later today.    Additional 400 mg Ibuprofen  x1 now    Daphne Barrack, NP-C, AGACNP-BC [facility] - Electrophysiology  02/25/2024, 12:13 PM

## 2024-02-25 NOTE — Evaluation (Signed)
 Physical Therapy Evaluation Patient Details Name: Courtney Davies MRN: [id number] DOB: [birth year] Today's Date: 02/25/2024  History of Present Illness  Pt is a 48 y/o female presenting after ICD shock x 2 at home. Found to be in a fib. 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 Lives alone, mother takes her shopping, has an aide that comes out daily but feels she needs more assist with bathing than she is getting currently. Pt able to transfer and ambulate at a supervision level with light support from rolling walker. Has been using bil canes/trekking poles at home apparently. Feels more confident with RW which I agree provides adequate support. Educated on safety, use of RW, and follow-up recommendations. Pt currently with functional limitations due to the deficits listed below (see PT Problem List). Pt will benefit from acute skilled PT to increase their independence and safety with mobility to allow discharge.           If plan is discharge home, recommend the following: A little help with bathing/dressing/bathroom;Assistance with cooking/housework;Assist for transportation;Help with stairs or ramp for entrance   Can travel by private vehicle        Equipment Recommendations Rolling walker (2 wheels)  Recommendations for Other Services       Functional Status Assessment Patient has had a recent decline in their functional status and demonstrates the ability to make significant improvements in function in a reasonable and predictable amount of time.     Precautions / Restrictions Precautions Precautions: Fall Recall of Precautions/Restrictions: Impaired Restrictions Weight Bearing Restrictions Per Provider Order: No      Mobility  Bed Mobility Overal bed mobility: Modified Independent             General bed  mobility comments: extra time no assist    Transfers Overall transfer level: Needs assistance Equipment used: Rolling walker (2 wheels) Transfers: Sit to/from Stand Sit to Stand: Supervision           General transfer comment: supervision for safety, slow and effortful rise but steadies appropriately with cues for RW use.    Ambulation/Gait Ambulation/Gait assistance: Supervision Gait Distance (Feet): 65 Feet Assistive device: Rolling walker (2 wheels) Gait Pattern/deviations: Step-through pattern, Decreased stride length, Trunk flexed Gait velocity: dec Gait velocity interpretation: <1.31 ft/sec, indicative of household ambulator   General Gait Details: Intermittent cues for upright stance and proximity to RW for maximum support and safety as needed. No overt LOB or buckling noted. Shows good control of AD.  Stairs            Wheelchair Mobility     Tilt Bed    Modified Rankin (Stroke Patients Only)       Balance Overall balance assessment: Mild deficits observed, not formally tested                                           Pertinent Vitals/Pain Pain Assessment Pain Assessment: Faces Faces Pain Scale: Hurts little more Pain Location: headache Pain Descriptors / Indicators: Headache Pain Intervention(s): Monitored during session, Limited activity within patient's tolerance    Home Living Family/patient expects to be discharged to:: Private residence Living Arrangements: Alone Available Help at Discharge: Family;Personal care attendant;Available PRN/intermittently Type of Home: House Home  Access: Stairs to enter Entrance Stairs-Rails: Doctor, General Practice of Steps: 3-4   Home Layout: One level Home Equipment: Other (comment) (canes vs hiking poles. pt reported to PT that she has a shower chair but told this OT that she does not have one)      Prior Function Prior Level of Function : History of Falls (last six  months);Patient poor historian/Family not available             Mobility Comments: States she occasionally using bil canes but having falls. ADLs Comments: Bath/dress self, states mom takes her shopping. reports having aide assist daily for IADLs but does not help with ADLs     Extremity/Trunk Assessment   Upper Extremity Assessment Upper Extremity Assessment: Defer to OT evaluation    Lower Extremity Assessment Lower Extremity Assessment: Generalized weakness    Cervical / Trunk Assessment Cervical / Trunk Assessment: Normal  Communication   Communication Communication: No apparent difficulties    Cognition Arousal: Alert Behavior During Therapy: Lability   PT - Cognitive impairments: History of cognitive impairments, No family/caregiver present to determine baseline                       PT - Cognition Comments: unsure of year. Following commands: Impaired Following commands impaired: Only follows one step commands consistently     Cueing Cueing Techniques: Verbal cues, Gestural cues     General Comments General comments (skin integrity, edema, etc.): SpO2 96% on RA, HR 68, BP 109/80    Exercises     Assessment/Plan    PT Assessment Patient needs continued PT services  PT Problem List Decreased strength;Decreased balance;Decreased activity tolerance;Decreased mobility;Decreased cognition;Decreased knowledge of use of DME       PT Treatment Interventions DME instruction;Gait training;Stair training;Functional mobility training;Therapeutic exercise;Therapeutic activities;Balance training;Neuromuscular re-education;Cognitive remediation;Patient/family education    PT Goals (Current goals can be found in the Care Plan section)  Acute Rehab PT Goals Patient Stated Goal: Get more help at home with bathing PT Goal Formulation: With patient Time For Goal Achievement: 03/10/24 Potential to Achieve Goals: Good    Frequency Min 2X/week      Co-evaluation               AM-PAC PT 6 Clicks Mobility  Outcome Measure Help needed turning from your back to your side while in a flat bed without using bedrails?: None Help needed moving from lying on your back to sitting on the side of a flat bed without using bedrails?: None Help needed moving to and from a bed to a chair (including a wheelchair)?: A Little Help needed standing up from a chair using your arms (e.g., wheelchair or bedside chair)?: A Little Help needed to walk in hospital room?: A Little Help needed climbing 3-5 steps with a railing? : A Little 6 Click Score: 20    End of Session   Activity Tolerance: Patient tolerated treatment well Patient left: Other (comment) (Standing with OT in hallway for assessment)   PT Visit Diagnosis: Unsteadiness on feet (R26.81);Other abnormalities of gait and mobility (R26.89);Repeated falls (R29.6);Muscle weakness (generalized) (M62.81);History of falling (Z91.81);Difficulty in walking, not elsewhere classified (R26.2)    Time: 0810-0827 PT Time Calculation (min) (ACUTE ONLY): 17 min   Charges:   PT Evaluation $PT Eval Low Complexity: 1 Low   PT General Charges $$ ACUTE PT VISIT: 1 Visit         Courtney Davies, PT, DPT [facility]  Rehabilitation  Services Physical Therapist Office: [phone] Website: [email or web address]   Courtney Davies 02/25/2024, 11:21 AM

## 2024-02-26 ENCOUNTER — Ambulatory Visit (HOSPITAL_COMMUNITY): Admitting: Licensed Clinical Social Worker

## 2024-02-26 ENCOUNTER — Ambulatory Visit (INDEPENDENT_AMBULATORY_CARE_PROVIDER_SITE_OTHER)

## 2024-02-26 DIAGNOSIS — I480 Paroxysmal atrial fibrillation: Secondary | ICD-10-CM | POA: Diagnosis not present

## 2024-02-26 DIAGNOSIS — Z4502 Encounter for adjustment and management of automatic implantable cardiac defibrillator: Secondary | ICD-10-CM | POA: Diagnosis not present

## 2024-02-26 DIAGNOSIS — I4729 Other ventricular tachycardia: Secondary | ICD-10-CM | POA: Diagnosis not present

## 2024-02-26 MED ORDER — ORAL CARE MOUTH RINSE: 15.0000 mL | OROMUCOSAL | Status: DC | PRN

## 2024-02-26 MED ORDER — ACETAMINOPHEN 325 MG PO TABS: 650.0000 mg | ORAL_TABLET | Freq: Four times a day (QID) | ORAL | Status: AC | PRN

## 2024-02-26 NOTE — TOC Transition Note (Cosign Needed)
 Transition of Care [facility]) - Discharge Note   Patient Details  Name: Courtney Davies MRN: [id number] Date of Birth: [birth year]  [facility] CM/SW Contact:  Roxie KANDICE Stain, RN Phone Number: 02/26/2024, 11:42 AM   Clinical Narrative:    Patient stable for discharge  Patient agreeable to home health and DME. This RNCM offered choice for Home Health, ANETRA CZERWINSKI states she has no preference, RNCM made referral to [place] [facility], She is able to take referral.  Zack with [employer] notified of DME order and request to have the DME shipped to address on file.   Patient needs bedside commode due to being confined to one room  Final next level of care: Home w Home Health Services Barriers to Discharge: Barriers Resolved   Patient Goals and CMS Choice Patient states their goals for this hospitalization and ongoing recovery are:: return home CMS Medicare.gov Compare Post Acute Care list provided to:: Patient Choice offered to / list presented to : Patient      Discharge Placement                       Discharge Plan and Services Additional resources added to the After Visit Summary for                  DME Arranged: Bedside commode, Other see comment (shower chair) DME Agency: [employer] Date DME Agency Contacted: 02/26/24 Time DME Agency Contacted: 1141 Representative spoke with at DME Agency: Zack HH Arranged: PT HH Agency: [facility] Date [facility] Agency Contacted: 02/26/24 Time HH Agency Contacted: 1141 Representative spoke with at [facility] Agency: lynette  Social Drivers of Health (SDOH) Interventions SDOH Screenings   Food Insecurity: Food Insecurity Present (02/24/2024)  Housing: Low Risk  (02/24/2024)  Transportation Needs: No Transportation Needs (02/24/2024)  Utilities: Not At Risk (02/24/2024)  Depression (PHQ2-9): Low Risk  (01/08/2024)  Recent Concern: Depression (PHQ2-9) - High Risk (12/19/2023)  Financial Resource Strain: High Risk  (08/02/2023)  Social Connections: Unknown (07/04/2023)  Stress: Stress Concern Present (05/15/2023)  Tobacco Use: High Risk (02/24/2024)     Readmission Risk Interventions    02/26/2024   11:41 AM  Readmission Risk Prevention Plan  Transportation Screening Complete  PCP or Specialist Appt within 5-7 Days Complete  Home Care Screening Complete

## 2024-02-26 NOTE — Progress Notes (Signed)
 Physical Therapy Treatment Patient Details Name: Courtney Davies MRN: [id number] DOB: 1975-08-17 Today's Date: 02/26/2024   History of Present Illness Pt is a 48 y/o female presenting after ICD shock x 2 at home. Found to be in a fib. 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 Eager for [employer] home.     If plan is discharge home, recommend the following: Assist for transportation;Help with stairs or ramp for entrance   Can travel by private vehicle        Equipment Recommendations  Rolling walker (2 wheels)    Recommendations for Other Services       Precautions / Restrictions Precautions Precautions: Fall Recall of Precautions/Restrictions: Impaired Restrictions Weight Bearing Restrictions Per Provider Order: No     Mobility  Bed Mobility               General bed mobility comments: Pt sitting EOB    Transfers Overall transfer level: Needs assistance Equipment used: Rolling walker (2 wheels) Transfers: Sit to/from Stand Sit to Stand: Supervision           General transfer comment: Supervision for safety    Ambulation/Gait Ambulation/Gait assistance: Supervision Gait Distance (Feet): 200 Feet Assistive device: Rolling walker (2 wheels), None Gait Pattern/deviations: Step-through pattern, Decreased stride length, Trunk flexed Gait velocity: decr Gait velocity interpretation: 1.31 - 2.62 ft/sec, indicative of limited community ambulator   General Gait Details: Supervision for safety. Cues to stay closer to walker. Used rolling walker in hallway and no device in room   Stairs Stairs: Yes Stairs assistance: Contact guard assist   Number of Stairs: 3 General stair comments: Up/down portable step with hand held   Wheelchair Mobility     Tilt Bed    Modified Rankin (Stroke Patients Only)        Balance Overall balance assessment: Mild deficits observed, not formally tested                                          Communication Communication Communication: No apparent difficulties  Cognition Arousal: Alert Behavior During Therapy: WFL for tasks assessed/performed   PT - Cognitive impairments: History of cognitive impairments                           Following commands impaired: Only follows one step commands consistently    Cueing Cueing Techniques: Verbal cues, Gestural cues  Exercises      General Comments        Pertinent Vitals/Pain Pain Assessment Pain Assessment: No/denies pain    Home Living                          Prior Function            PT Goals (current goals can now be found in the care plan section) Progress towards PT goals: Progressing toward goals    Frequency           PT Plan      Co-evaluation              AM-PAC PT 6 Clicks Mobility   Outcome Measure  Help needed turning from your back to  your side while in a flat bed without using bedrails?: None Help needed moving from lying on your back to sitting on the side of a flat bed without using bedrails?: None Help needed moving to and from a bed to a chair (including a wheelchair)?: A Little Help needed standing up from a chair using your arms (e.g., wheelchair or bedside chair)?: A Little Help needed to walk in hospital room?: A Little Help needed climbing 3-5 steps with a railing? : A Little 6 Click Score: 20    End of Session   Activity Tolerance: Patient tolerated treatment well Patient left: in bed;with call bell/phone within reach   PT Visit Diagnosis: Unsteadiness on feet (R26.81);Repeated falls (R29.6);Muscle weakness (generalized) (M62.81)     Time: 8862-8848 PT Time Calculation (min) (ACUTE ONLY): 14 min  Charges:    $Gait Training: 8-22 mins PT General Charges $$ ACUTE PT VISIT: 1 Visit                      [facility] PT Acute Rehabilitation Services Office [phone]    [facility] ORN [facility] 02/26/2024, 12:03 PM

## 2024-02-26 NOTE — Telephone Encounter (Signed)
   Patient Name: Courtney Davies  DOB: 09-15-1975 MRN: [id number]  Primary Cardiologist: Candyce Reek, MD Electrophysiologist: Soyla Gladis Norton, MD   Chart reviewed as part of pre-operative protocol coverage. Patient actively admitted due to ventricular tachycardia with plan for outpatient EP follow-up 03/31/24, therefore not cleared for knee surgery at this time. Also reviewed with EP team (Brandi Ollis/Dr. Norton) - Brandi plans to review progress at outpatient appointment on 03/31/24 and she will provide preoperative recommendations at that time. Will route to surgeon so they are aware.  Still pending pharm review as routed below, but now procedure date will not be for at least another month plus.    Akeila Lana N Linell Shawn, PA-C 02/26/2024, 10:24 AM

## 2024-02-26 NOTE — Progress Notes (Signed)
   Virtual Visit via Telephone Note   Because of Courtney Davies's co-morbid illnesses, she is at least at moderate risk for complications without adequate follow up.  This format is felt to be most appropriate for this patient at this time.  The patient did not have access to video technology/had technical difficulties with video requiring transitioning to audio format only (telephone).  All issues noted in this document were discussed and addressed.  No physical exam could be performed with this format.  Please refer to the patient's chart for her consent to telehealth for [facility].  Evaluation Performed:  Preoperative cardiovascular risk assessment _____________   Date:  02/26/2024   Patient ID:  Courtney Davies, DOB 1976-01-12, MRN [id number] Patient Location:  Home Provider location:   Office  Primary Care Provider:  [name] BRAVO, NP  Primary Cardiologist:  [facility] Providers Cardiologist:  Candyce Reek, MD Electrophysiologist:  Will Gladis Norton, MD     Patient recently underwent hospital admission for ICD shock therefore tele visit is no longer appropriate to evaluate preoperative risk. She is scheduled for hospital follow up on 03/27/2024 with Charlies Arthur, PA-C. Pre-op  evaluation can be performed at that time if appropriate.   Barnie Hila, NP  02/26/2024, 8:10 AM

## 2024-02-27 ENCOUNTER — Ambulatory Visit (HOSPITAL_COMMUNITY): Admitting: Licensed Clinical Social Worker

## 2024-02-27 ENCOUNTER — Telehealth: Payer: Self-pay

## 2024-02-27 NOTE — Telephone Encounter (Signed)
 Patient with diagnosis of atrial fibrillation on Eliquis  for anticoagulation.    Procedure:  LEFT TOTAL KNEE   Date of Surgery:  Clearance TBD     CHA2DS2-VASc Score = 5   This indicates a 7.2% annual risk of stroke. The patient's score is based upon: CHF History: 0 HTN History: 1 Diabetes History: 1 Stroke History: 2 Vascular Disease History: 0 Age Score: 0 Gender Score: 1    CrCl 104 Platelet count 299  Patient has not had an Afib/aflutter ablation in the last 3 months, DCCV within the last 4 weeks or a watchman implanted in the last 45 days .  Per office protocol, patient can hold Eliquis  for 3 days prior to procedure.   Patient will not need bridging with Lovenox  (enoxaparin ) around procedure.  **This guidance is not considered finalized until pre-operative APP has relayed final recommendations.**

## 2024-02-27 NOTE — Transitions of Care (Post Inpatient/ED Visit) (Signed)
   02/27/2024  Name: Courtney Davies MRN: [id number] DOB: 19-Sep-1975  Today's TOC FU Call Status: Today's TOC FU Call Status:: Unsuccessful Call (1st Attempt) Unsuccessful Call (1st Attempt) Date: 02/27/24  Attempted to reach the patient regarding the most recent Inpatient/ED visit.  Follow Up Plan: Additional outreach attempts will be made to reach the patient to complete the Transitions of Care (Post Inpatient/ED visit) call.   Shona Prow RN, CCM [place]  VBCI-Population Health RN Care Manager [phone]

## 2024-02-28 ENCOUNTER — Other Ambulatory Visit: Payer: Self-pay | Admitting: Physical Medicine & Rehabilitation

## 2024-02-28 ENCOUNTER — Telehealth: Payer: Self-pay

## 2024-02-28 DIAGNOSIS — T82198A Other mechanical complication of other cardiac electronic device, initial encounter: Secondary | ICD-10-CM

## 2024-02-28 DIAGNOSIS — F321 Major depressive disorder, single episode, moderate: Secondary | ICD-10-CM

## 2024-02-28 DIAGNOSIS — F1290 Cannabis use, unspecified, uncomplicated: Secondary | ICD-10-CM

## 2024-02-28 DIAGNOSIS — R413 Other amnesia: Secondary | ICD-10-CM

## 2024-02-28 DIAGNOSIS — R29818 Other symptoms and signs involving the nervous system: Secondary | ICD-10-CM

## 2024-02-28 NOTE — Patient Instructions (Signed)
 Visit Information  Thank you for taking time to visit with me today. Please don't hesitate to contact me if I can be of assistance to you before our next scheduled telephone appointment.  Our next appointment is by telephone on 03/05/24 in the morning  Following is a copy of your care plan:   Goals Addressed             This Visit's Progress    VBCI Transitions of Care (TOC) Care Plan       Problems:  Recent Hospitalization for treatment of Admit/Discharge Date 11/9 - 11/12  Courtney Davies   Primary Diagnosis: Monomorphic VT s/p ICD Shock x2   previously placed ICD (implantable cardioverter-defibrillator) 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  SDOH: food and utility - BSW referral made - Patient with Managed Medicaid - BSW to refer back to [facility] - patient agreeable to all appt scheduled 03/02/24 2pm with S Cardenas  Goal:  Over the next 30 days, the patient will not experience hospital readmission  Interventions:  Transitions of Care: Doctor Visits  - discussed the importance of doctor visits Contacted provider for patient needs to change routine appt 03/05/24 to hospital follow up  Discussed importance of picking up meds at pharmacy today  Diabetes Interventions: Assessed patient's understanding of A1c goal: remain < 6.0 Provided education to patient about basic DM disease process Reviewed medications with patient and discussed importance of medication adherence Discussed plans with patient for ongoing care management follow up and provided patient with direct contact information for care management team Assessed social determinant of health barriers Lab Results  Component Value Date   HGBA1C 5.9 (H) 01/29/2024    Patient Self Care Activities:  Attend all scheduled provider appointments Call pharmacy for medication refills 3-7 days in advance of running out of medications Call provider office for new concerns or questions  Notify RN Care Manager of [facility] call  rescheduling needs Participate in Transition of Care Program/Attend TOC scheduled calls Take medications as prescribed    Plan:  Telephone follow up appointment with care management team member scheduled for:  03/06/24 in the morning The patient has been provided with contact information for the care management team and has been advised to call with any health related questions or concerns.         Patient verbalizes understanding of instructions and care plan provided today and agrees to view in MyChart. Active MyChart status and patient understanding of how to access instructions and care plan via MyChart confirmed with patient.     Telephone follow up appointment with care management team member scheduled for: 03/05/24  The patient has been provided with contact information for the care management team and has been advised to call with any health related questions or concerns.   Please call the care guide team at [phone] if you need to cancel or reschedule your appointment.   Please call the Suicide and Crisis Lifeline: 988 call 1-800-273-TALK (toll free, 24 hour hotline) call 911 if you are experiencing a Mental Health or Behavioral Health Crisis or need someone to talk to.  Shona Prow RN, CCM [place]  VBCI-Population Health RN Care Manager [phone]

## 2024-02-28 NOTE — Transitions of Care (Post Inpatient/ED Visit) (Signed)
 02/28/2024  Name: Courtney Davies MRN: [id number] DOB: 16-Nov-1975  Today's TOC FU Call Status: Today's TOC FU Call Status:: Successful TOC FU Call Completed TOC FU Call Complete Date: 02/28/24  Patient's Name and Date of Birth confirmed. DOB, Name  Transition Care Management Follow-up Telephone Call How have you been since you were released from the hospital?: Better Any questions or concerns?: No  Items Reviewed: Did you receive and understand the discharge instructions provided?: Yes Medications obtained,verified, and reconciled?: Yes (Medications Reviewed) Any new allergies since your discharge?: No Dietary orders reviewed?: Yes Type of Diet Ordered:: low sodium Do you have support at home?: Yes Name of Support/Comfort Primary Source: Patient states she has a cg 2 hours a day 7 days a week - 2 adult children live wither her and mother helps with medications  Medications Reviewed Today: Medications Reviewed Today     Reviewed by Lauro Shona LABOR, RN (Registered Nurse) on 02/28/24 at 1054  Med List Status: <None>   Medication Order Taking? Sig Documenting Provider Last Dose Status Informant  Accu-Chek Softclix Lancets lancets [id number] Yes Check blood sugars once daily Dx code E10.9 [name] BRAVO, NP  Active Self, Mother, Pharmacy Records  acetaminophen  (TYLENOL ) 325 MG tablet [id number] Yes Take 2 tablets (650 mg total) by mouth every 6 (six) hours as needed for headache or mild pain (pain score 1-3). Aniceto Daphne [name], NP  Active   apixaban  (ELIQUIS ) 5 MG TABS tablet [id number] Yes Take 1 tablet (5 mg total) by mouth 2 (two) times daily. Lesia Ozell [name], PA-C  Active Self, Mother, Pharmacy Records  BD PEN NEEDLE NANO 2ND GEN 32G X 4 MM MISC [id number] Yes 3 (three) times daily. as directed [provider]  Active Self, Mother, Pharmacy Records  Blood Glucose Monitoring Suppl (ACCU-CHEK GUIDE ME) w/Device KIT [id number] Yes 1 kit by Does not apply route as directed.  Check blood sugars once daily Dx code E11.65 [name] BRAVO, NP  Active Self, Mother, Pharmacy Records  carvedilol  (COREG ) 6.25 MG tablet [id number] Yes TAKE 1 TABLET(6.25 MG) BY MOUTH TWICE DAILY [name], [name], NP  Active Self, Mother, Pharmacy Records  cetirizine  (ZYRTEC ) 10 MG tablet [id number] Yes Take 1 tablet (10 mg total) by mouth daily. [name] BRAVO, NP  Active Self, Mother, Pharmacy Records           Med Note SANDIE, MARYLAND JINNY Repress Feb 23, 2024  4:17 PM) No refills  docusate sodium  (COLACE) 100 MG capsule [id number] Yes Take 1 capsule (100 mg total) by mouth daily as needed. Jule Ronal [name], PA-C  Active Self, Mother, Pharmacy Records  doxycycline  (VIBRA -TABS) 100 MG tablet [id number] Yes Take 1 tablet (100 mg total) by mouth 2 (two) times daily. To be taken after surgery Jule Ronal [name], PA-C  Active Self, Mother, Pharmacy Records  gabapentin  (NEURONTIN ) 100 MG capsule [id number] Yes Take 100 mg by mouth 2 (two) times daily. [provider]  Active Self, Mother, Pharmacy Records  hydrOXYzine  (ATARAX ) 10 MG tablet [id number] Yes TAKE 1 TABLET(10 MG) BY MOUTH AT BEDTIME [name] BRAVO, NP  Active Self, Mother, Pharmacy Records  lamoTRIgine  (LAMICTAL ) 100 MG tablet [id number] Yes Take 1 tab twice a day Babs Arthea [name], MD  Active Self, Mother, Pharmacy Records  linaclotide  (LINZESS ) 145 MCG CAPS capsule [id number] Yes Take 145 mcg by mouth daily as needed (constipation). [provider]  Active Self, Mother, Pharmacy Records  losartan  (COZAAR ) 25 MG tablet [id number] Yes TAKE  1 TABLET BY MOUTH DAILY [name], [name], NP  Active Self, Mother, Pharmacy Records  metformin (FORTAMET) 500 MG (OSM) 24 hr tablet [id number] Yes Take 500 mg by mouth in the morning and at bedtime. [provider]  Active Self, Mother, Pharmacy Records  methocarbamol  (ROBAXIN ) 750 MG tablet [id number] Yes Take 1 tablet (750 mg total) by mouth 3 (three) times daily as needed.  Patient taking differently:  Take 750 mg by mouth at bedtime.   Jule Ronal [name], PA-C  Active Self, Mother, Pharmacy Records  omeprazole (PRILOSEC) 40 MG capsule [id number] Yes Take 40 mg by mouth in the morning and at bedtime. [provider]  Active Self, Mother, Pharmacy Records  ondansetron  (ZOFRAN ) 4 MG tablet [id number] Yes Take 1 tablet (4 mg total) by mouth every 8 (eight) hours as needed for nausea or vomiting. Jule Ronal [name], PA-C  Active Self, Mother, Pharmacy Records  [facility], 2 MG/DOSE, 8 MG/3ML NELMA [id number] Yes Inject 2 mg into the skin once a week. [provider]  Active Self, Mother, Pharmacy Records  QUEtiapine  150 MG TABS [id number] Yes Take 150 mg by mouth at bedtime. Arfeen, Syed T, MD  Active Self, Mother, Pharmacy Records  rivastigmine  (EXELON ) 3 MG capsule [id number] Yes TAKE 1 CAPSULE(3 MG) BY MOUTH TWICE DAILY Babs Arthea [name], MD  Active Self, Mother, Pharmacy Records  rosuvastatin  (CRESTOR ) 20 MG tablet [id number] Yes Take 1.5 tablets (30 mg total) by mouth daily. [name] BRAVO, NP  Active Self, Mother, Pharmacy Records  sertraline  (ZOLOFT ) 100 MG tablet [id number] Yes Take 1 tablet (100 mg total) by mouth daily. Curry Leni [name], MD  Active Self, Mother, Pharmacy Records            Home Care and Equipment/Supplies: Were Home Health Services Ordered?: Yes Name of Home Health Agency:: [facility] Has Agency set up a time to come to your home?: Yes First Home Health Visit Date: 02/27/24 Any new equipment or medical supplies ordered?: Yes Name of Medical supply agency?: [facility] Were you able to get the equipment/medical supplies?: Yes Do you have any questions related to the use of the equipment/supplies?: No (Received a text that 3:1 is out for delivery)  Functional Questionnaire: Do you need assistance with bathing/showering or dressing?: Yes Do you need assistance with meal preparation?: Yes (her kids or her mom) Do you need assistance with eating?: No Do you have difficulty  maintaining continence: Yes (patient states she wears adult briefs) Do you need assistance with getting out of bed/getting out of a chair/moving?: No (moves slowly) Do you have difficulty managing or taking your medications?: Yes (patient's mother fills pill box weekely)  Follow up appointments reviewed: PCP Follow-up appointment confirmed?: Yes Date of PCP follow-up appointment?: 03/05/24 (conference call to PCP office to change routine appt to hospital follow up) Follow-up Provider: PCP, Amy [facility] Follow-up appointment confirmed?: Yes Date of Specialist follow-up appointment?: 03/09/24 Follow-Up Specialty Provider:: Timother Hayden Do you need transportation to your follow-up appointment?: No (Patient has services  via Medicaid) Do you understand care options if your condition(s) worsen?: Yes-patient verbalized understanding  SDOH Interventions Today    Flowsheet Row Most Recent Value  SDOH Interventions   Food Insecurity Interventions AMB Referral  Housing Interventions Intervention Not Indicated  Transportation Interventions Intervention Not Indicated  Utilities Interventions AMB Referral    Goals Addressed             This Visit's Progress    VBCI Transitions of Care (  TOC) Care Plan       Problems:  Recent Hospitalization for treatment of Admit/Discharge Date 11/9 - 11/12  [facility]   Primary Diagnosis: Monomorphic VT s/p ICD Shock x2   previously placed ICD (implantable cardioverter-defibrillator) 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  SDOH: food and utility - BSW referral made - Patient with Managed Medicaid - BSW to refer back to [facility] - patient agreeable to all appt scheduled 03/02/24 2pm with S Cardenas  Goal:  Over the next 30 days, the patient will not experience hospital readmission  Interventions:  Transitions of Care: Doctor Visits  - discussed the importance of doctor visits Contacted provider for patient needs to change  routine appt 03/05/24 to hospital follow up  Discussed importance of picking up meds at pharmacy today  Diabetes Interventions: Assessed patient's understanding of A1c goal: remain < 6.0 Provided education to patient about basic DM disease process Reviewed medications with patient and discussed importance of medication adherence Discussed plans with patient for ongoing care management follow up and provided patient with direct contact information for care management team Assessed social determinant of health barriers Lab Results  Component Value Date   HGBA1C 5.9 (H) 01/29/2024    Patient Self Care Activities:  Attend all scheduled provider appointments Call pharmacy for medication refills 3-7 days in advance of running out of medications Call provider office for new concerns or questions  Notify RN Care Manager of [facility] call rescheduling needs Participate in Transition of Care Program/Attend TOC scheduled calls Take medications as prescribed    Plan:  Telephone follow up appointment with care management team member scheduled for:  03/06/24 in the morning The patient has been provided with contact information for the care management team and has been advised to call with any health related questions or concerns.       Shona Prow RN, CCM [place]  VBCI-Population Health RN Care Manager [phone]

## 2024-02-29 ENCOUNTER — Emergency Department (HOSPITAL_COMMUNITY)

## 2024-02-29 ENCOUNTER — Inpatient Hospital Stay (HOSPITAL_COMMUNITY)
Admission: EM | Admit: 2024-02-29 | Discharge: 2024-03-05 | DRG: 309 | Disposition: A | Discharge status: Home Health | Attending: Student in an Organized Health Care Education/Training Program | Admitting: Student in an Organized Health Care Education/Training Program

## 2024-02-29 ENCOUNTER — Other Ambulatory Visit: Payer: Self-pay

## 2024-02-29 DIAGNOSIS — Z7901 Long term (current) use of anticoagulants: Secondary | ICD-10-CM | POA: Diagnosis not present

## 2024-02-29 DIAGNOSIS — R413 Other amnesia: Secondary | ICD-10-CM | POA: Diagnosis present

## 2024-02-29 DIAGNOSIS — Z95810 Presence of automatic (implantable) cardiac defibrillator: Secondary | ICD-10-CM

## 2024-02-29 DIAGNOSIS — G43909 Migraine, unspecified, not intractable, without status migrainosus: Secondary | ICD-10-CM | POA: Diagnosis present

## 2024-02-29 DIAGNOSIS — Z91040 Latex allergy status: Secondary | ICD-10-CM

## 2024-02-29 DIAGNOSIS — Z860101 Personal history of adenomatous and serrated colon polyps: Secondary | ICD-10-CM

## 2024-02-29 DIAGNOSIS — K219 Gastro-esophageal reflux disease without esophagitis: Secondary | ICD-10-CM | POA: Diagnosis present

## 2024-02-29 DIAGNOSIS — Z59868 Other specified financial insecurity: Secondary | ICD-10-CM

## 2024-02-29 DIAGNOSIS — Z8669 Personal history of other diseases of the nervous system and sense organs: Secondary | ICD-10-CM | POA: Diagnosis not present

## 2024-02-29 DIAGNOSIS — G8929 Other chronic pain: Secondary | ICD-10-CM | POA: Diagnosis present

## 2024-02-29 DIAGNOSIS — Z7984 Long term (current) use of oral hypoglycemic drugs: Secondary | ICD-10-CM

## 2024-02-29 DIAGNOSIS — E538 Deficiency of other specified B group vitamins: Secondary | ICD-10-CM | POA: Diagnosis present

## 2024-02-29 DIAGNOSIS — Z98891 History of uterine scar from previous surgery: Secondary | ICD-10-CM

## 2024-02-29 DIAGNOSIS — I462 Cardiac arrest due to underlying cardiac condition: Secondary | ICD-10-CM | POA: Diagnosis present

## 2024-02-29 DIAGNOSIS — G931 Anoxic brain damage, not elsewhere classified: Secondary | ICD-10-CM | POA: Diagnosis present

## 2024-02-29 DIAGNOSIS — K5900 Constipation, unspecified: Secondary | ICD-10-CM | POA: Diagnosis present

## 2024-02-29 DIAGNOSIS — Z8249 Family history of ischemic heart disease and other diseases of the circulatory system: Secondary | ICD-10-CM

## 2024-02-29 DIAGNOSIS — Z833 Family history of diabetes mellitus: Secondary | ICD-10-CM

## 2024-02-29 DIAGNOSIS — I5032 Chronic diastolic (congestive) heart failure: Secondary | ICD-10-CM | POA: Diagnosis present

## 2024-02-29 DIAGNOSIS — Z800 Family history of malignant neoplasm of digestive organs: Secondary | ICD-10-CM

## 2024-02-29 DIAGNOSIS — K3184 Gastroparesis: Secondary | ICD-10-CM | POA: Diagnosis present

## 2024-02-29 DIAGNOSIS — D6869 Other thrombophilia: Secondary | ICD-10-CM | POA: Diagnosis present

## 2024-02-29 DIAGNOSIS — F17210 Nicotine dependence, cigarettes, uncomplicated: Secondary | ICD-10-CM | POA: Diagnosis present

## 2024-02-29 DIAGNOSIS — J4489 Other specified chronic obstructive pulmonary disease: Secondary | ICD-10-CM | POA: Diagnosis present

## 2024-02-29 DIAGNOSIS — E119 Type 2 diabetes mellitus without complications: Secondary | ICD-10-CM

## 2024-02-29 DIAGNOSIS — F1290 Cannabis use, unspecified, uncomplicated: Secondary | ICD-10-CM | POA: Diagnosis present

## 2024-02-29 DIAGNOSIS — F1210 Cannabis abuse, uncomplicated: Secondary | ICD-10-CM | POA: Diagnosis present

## 2024-02-29 DIAGNOSIS — Z5948 Other specified lack of adequate food: Secondary | ICD-10-CM

## 2024-02-29 DIAGNOSIS — M25562 Pain in left knee: Secondary | ICD-10-CM | POA: Diagnosis present

## 2024-02-29 DIAGNOSIS — Z4502 Encounter for adjustment and management of automatic implantable cardiac defibrillator: Secondary | ICD-10-CM | POA: Diagnosis not present

## 2024-02-29 DIAGNOSIS — M5440 Lumbago with sciatica, unspecified side: Secondary | ICD-10-CM | POA: Diagnosis present

## 2024-02-29 DIAGNOSIS — Z48812 Encounter for surgical aftercare following surgery on the circulatory system: Secondary | ICD-10-CM | POA: Diagnosis not present

## 2024-02-29 DIAGNOSIS — Z86018 Personal history of other benign neoplasm: Secondary | ICD-10-CM

## 2024-02-29 DIAGNOSIS — I428 Other cardiomyopathies: Secondary | ICD-10-CM | POA: Diagnosis present

## 2024-02-29 DIAGNOSIS — I4901 Ventricular fibrillation: Secondary | ICD-10-CM | POA: Diagnosis present

## 2024-02-29 DIAGNOSIS — M170 Bilateral primary osteoarthritis of knee: Secondary | ICD-10-CM | POA: Diagnosis present

## 2024-02-29 DIAGNOSIS — K760 Fatty (change of) liver, not elsewhere classified: Secondary | ICD-10-CM | POA: Diagnosis present

## 2024-02-29 DIAGNOSIS — I472 Ventricular tachycardia, unspecified: Principal | ICD-10-CM | POA: Diagnosis present

## 2024-02-29 DIAGNOSIS — R112 Nausea with vomiting, unspecified: Secondary | ICD-10-CM | POA: Diagnosis not present

## 2024-02-29 DIAGNOSIS — K2970 Gastritis, unspecified, without bleeding: Secondary | ICD-10-CM | POA: Diagnosis present

## 2024-02-29 DIAGNOSIS — E1159 Type 2 diabetes mellitus with other circulatory complications: Secondary | ICD-10-CM | POA: Diagnosis not present

## 2024-02-29 DIAGNOSIS — Z9851 Tubal ligation status: Secondary | ICD-10-CM

## 2024-02-29 DIAGNOSIS — Z7985 Long-term (current) use of injectable non-insulin antidiabetic drugs: Secondary | ICD-10-CM

## 2024-02-29 DIAGNOSIS — I252 Old myocardial infarction: Secondary | ICD-10-CM

## 2024-02-29 DIAGNOSIS — I4891 Unspecified atrial fibrillation: Secondary | ICD-10-CM

## 2024-02-29 DIAGNOSIS — R0789 Other chest pain: Secondary | ICD-10-CM | POA: Diagnosis present

## 2024-02-29 DIAGNOSIS — D649 Anemia, unspecified: Secondary | ICD-10-CM | POA: Diagnosis not present

## 2024-02-29 DIAGNOSIS — I480 Paroxysmal atrial fibrillation: Secondary | ICD-10-CM | POA: Diagnosis present

## 2024-02-29 DIAGNOSIS — Z885 Allergy status to narcotic agent status: Secondary | ICD-10-CM

## 2024-02-29 DIAGNOSIS — Z8719 Personal history of other diseases of the digestive system: Secondary | ICD-10-CM | POA: Diagnosis not present

## 2024-02-29 DIAGNOSIS — Z8042 Family history of malignant neoplasm of prostate: Secondary | ICD-10-CM

## 2024-02-29 DIAGNOSIS — Z9889 Other specified postprocedural states: Secondary | ICD-10-CM

## 2024-02-29 DIAGNOSIS — I110 Hypertensive heart disease with heart failure: Secondary | ICD-10-CM | POA: Diagnosis present

## 2024-02-29 DIAGNOSIS — Z883 Allergy status to other anti-infective agents status: Secondary | ICD-10-CM

## 2024-02-29 DIAGNOSIS — E1143 Type 2 diabetes mellitus with diabetic autonomic (poly)neuropathy: Secondary | ICD-10-CM | POA: Diagnosis present

## 2024-02-29 DIAGNOSIS — Z5941 Food insecurity: Secondary | ICD-10-CM

## 2024-02-29 DIAGNOSIS — R5381 Other malaise: Secondary | ICD-10-CM | POA: Diagnosis present

## 2024-02-29 DIAGNOSIS — Z90710 Acquired absence of both cervix and uterus: Secondary | ICD-10-CM

## 2024-02-29 DIAGNOSIS — Z823 Family history of stroke: Secondary | ICD-10-CM

## 2024-02-29 DIAGNOSIS — Z79899 Other long term (current) drug therapy: Secondary | ICD-10-CM

## 2024-02-29 DIAGNOSIS — Z832 Family history of diseases of the blood and blood-forming organs and certain disorders involving the immune mechanism: Secondary | ICD-10-CM

## 2024-02-29 DIAGNOSIS — Z91048 Other nonmedicinal substance allergy status: Secondary | ICD-10-CM

## 2024-02-29 DIAGNOSIS — Z87892 Personal history of anaphylaxis: Secondary | ICD-10-CM

## 2024-02-29 DIAGNOSIS — F329 Major depressive disorder, single episode, unspecified: Secondary | ICD-10-CM | POA: Diagnosis present

## 2024-02-29 DIAGNOSIS — Z8674 Personal history of sudden cardiac arrest: Secondary | ICD-10-CM

## 2024-02-29 DIAGNOSIS — Z888 Allergy status to other drugs, medicaments and biological substances status: Secondary | ICD-10-CM

## 2024-02-29 LAB — COMPREHENSIVE METABOLIC PANEL WITH GFR
ALT: 17 U/L (ref 0–44)
AST: 23 U/L (ref 15–41)
Albumin: 3.8 g/dL (ref 3.5–5.0)
Alkaline Phosphatase: 96 U/L (ref 38–126)
Anion gap: 11 (ref 5–15)
BUN: 17 mg/dL (ref 6–20)
CO2: 18 mmol/L — ABNORMAL LOW (ref 22–32)
Calcium: 9.1 mg/dL (ref 8.9–10.3)
Chloride: 109 mmol/L (ref 98–111)
Creatinine, Ser: 0.96 mg/dL (ref 0.44–1.00)
GFR, Estimated: >60 mL/min (ref >60)
Glucose, Bld: 112 mg/dL — ABNORMAL HIGH (ref 70–99)
Potassium: 3.6 mmol/L (ref 3.5–5.1)
Sodium: 138 mmol/L (ref 135–145)
Total Bilirubin: 0.5 mg/dL (ref 0.0–1.2)
Total Protein: 7.5 g/dL (ref 6.5–8.1)

## 2024-02-29 LAB — CBC WITH DIFFERENTIAL/PLATELET
Abs Immature Granulocytes: 0.01 K/uL (ref 0.00–0.07)
Basophils Absolute: 0 K/uL (ref 0.0–0.1)
Basophils Relative: 1 %
Eosinophils Absolute: 0.2 K/uL (ref 0.0–0.5)
Eosinophils Relative: 3 %
HCT: 41.6 % (ref 36.0–46.0)
Hemoglobin: 13 g/dL (ref 12.0–15.0)
Immature Granulocytes: 0 %
Lymphocytes Relative: 39 %
Lymphs Abs: 2.1 K/uL (ref 0.7–4.0)
MCH: 22.7 pg — ABNORMAL LOW (ref 26.0–34.0)
MCHC: 31.3 g/dL (ref 30.0–36.0)
MCV: 72.7 fL — ABNORMAL LOW (ref 80.0–100.0)
Monocytes Absolute: 0.5 K/uL (ref 0.1–1.0)
Monocytes Relative: 9 %
Neutro Abs: 2.6 K/uL (ref 1.7–7.7)
Neutrophils Relative %: 48 %
Platelets: 272 K/uL (ref 150–400)
RBC: 5.72 MIL/uL — ABNORMAL HIGH (ref 3.87–5.11)
RDW: 15.9 % — ABNORMAL HIGH (ref 11.5–15.5)
WBC: 5.5 K/uL (ref 4.0–10.5)
nRBC: 0 % (ref 0.0–0.2)

## 2024-02-29 LAB — TROPONIN I (HIGH SENSITIVITY)
Troponin I (High Sensitivity): 4 ng/L (ref <18)
Troponin I (High Sensitivity): 6 ng/L (ref <18)

## 2024-02-29 LAB — LIPASE, BLOOD: Lipase: 29 U/L (ref 11–51)

## 2024-02-29 LAB — TSH: TSH: 2.067 u[IU]/mL (ref 0.350–4.500)

## 2024-02-29 LAB — MAGNESIUM: Magnesium: 1.6 mg/dL — ABNORMAL LOW (ref 1.7–2.4)

## 2024-02-29 MED ORDER — SERTRALINE HCL 100 MG PO TABS
100.0000 mg | ORAL_TABLET | Freq: Every day | ORAL | Status: DC
  Administered 2024-03-01 – 2024-03-05 (×5): 100 mg via ORAL
  Filled 2024-02-29 (×5): qty 1

## 2024-02-29 MED ORDER — LAMOTRIGINE 100 MG PO TABS
100.0000 mg | ORAL_TABLET | Freq: Two times a day (BID) | ORAL | Status: DC
  Administered 2024-02-29 – 2024-03-05 (×10): 100 mg via ORAL
  Filled 2024-02-29 (×10): qty 1

## 2024-02-29 MED ORDER — APIXABAN 5 MG PO TABS
5.0000 mg | ORAL_TABLET | Freq: Two times a day (BID) | ORAL | Status: DC
  Administered 2024-02-29 – 2024-03-05 (×10): 5 mg via ORAL
  Filled 2024-02-29 (×10): qty 1

## 2024-02-29 MED ORDER — MAGNESIUM SULFATE 2 GM/50ML IV SOLN
2.0000 g | Freq: Once | INTRAVENOUS | Status: AC
  Administered 2024-02-29: 2 g via INTRAVENOUS
  Filled 2024-02-29: qty 50

## 2024-02-29 MED ORDER — METFORMIN HCL ER 500 MG PO TB24
500.0000 mg | ORAL_TABLET | Freq: Two times a day (BID) | ORAL | Status: DC
  Administered 2024-03-01: 500 mg via ORAL
  Filled 2024-02-29 (×3): qty 1

## 2024-02-29 MED ORDER — POTASSIUM CHLORIDE CRYS ER 20 MEQ PO TBCR
40.0000 meq | EXTENDED_RELEASE_TABLET | Freq: Once | ORAL | Status: AC
  Administered 2024-02-29: 40 meq via ORAL
  Filled 2024-02-29: qty 2

## 2024-02-29 MED ORDER — LOSARTAN POTASSIUM 25 MG PO TABS
25.0000 mg | ORAL_TABLET | Freq: Every day | ORAL | Status: DC
  Administered 2024-03-01 – 2024-03-02 (×2): 25 mg via ORAL
  Filled 2024-02-29 (×2): qty 1

## 2024-02-29 MED ORDER — AMIODARONE LOAD VIA INFUSION
150.0000 mg | Freq: Once | INTRAVENOUS | Status: AC
  Administered 2024-02-29: 150 mg via INTRAVENOUS
  Filled 2024-02-29: qty 83.34

## 2024-02-29 MED ORDER — CARVEDILOL 3.125 MG PO TABS
6.2500 mg | ORAL_TABLET | Freq: Two times a day (BID) | ORAL | Status: DC
  Administered 2024-03-01: 6.25 mg via ORAL
  Filled 2024-02-29: qty 2

## 2024-02-29 MED ORDER — ONDANSETRON HCL 4 MG/2ML IJ SOLN: 4.0000 mg | Freq: Four times a day (QID) | INTRAMUSCULAR | Status: DC | PRN

## 2024-02-29 MED ORDER — PANTOPRAZOLE SODIUM 40 MG PO TBEC
40.0000 mg | DELAYED_RELEASE_TABLET | Freq: Every day | ORAL | Status: DC
  Administered 2024-03-01 – 2024-03-05 (×5): 40 mg via ORAL
  Filled 2024-02-29 (×5): qty 1

## 2024-02-29 MED ORDER — AMIODARONE HCL IN DEXTROSE 360-4.14 MG/200ML-% IV SOLN
60.0000 mg/h | INTRAVENOUS | Status: AC
  Administered 2024-02-29: 60 mg/h via INTRAVENOUS
  Filled 2024-02-29 (×2): qty 200

## 2024-02-29 MED ORDER — ROSUVASTATIN CALCIUM 5 MG PO TABS
30.0000 mg | ORAL_TABLET | Freq: Every day | ORAL | Status: DC
  Administered 2024-03-01 – 2024-03-05 (×5): 30 mg via ORAL
  Filled 2024-02-29 (×5): qty 2

## 2024-02-29 MED ORDER — AMIODARONE HCL IN DEXTROSE 360-4.14 MG/200ML-% IV SOLN
30.0000 mg/h | INTRAVENOUS | Status: DC
  Administered 2024-03-01 – 2024-03-03 (×5): 30 mg/h via INTRAVENOUS
  Filled 2024-02-29 (×4): qty 200

## 2024-02-29 MED ORDER — ACETAMINOPHEN 325 MG PO TABS
650.0000 mg | ORAL_TABLET | ORAL | Status: DC | PRN
  Administered 2024-03-01 (×2): 650 mg via ORAL
  Filled 2024-02-29 (×3): qty 2

## 2024-02-29 MED ORDER — GABAPENTIN 100 MG PO CAPS
100.0000 mg | ORAL_CAPSULE | Freq: Two times a day (BID) | ORAL | Status: DC
  Administered 2024-02-29 – 2024-03-05 (×10): 100 mg via ORAL
  Filled 2024-02-29 (×10): qty 1

## 2024-02-29 NOTE — H&P (Signed)
 Cardiology Admission History and Physical   Patient ID: Courtney Davies MRN: [id number]; DOB: [birth year]   Admission date: 02/29/2024  PCP:  [name] BRAVO, NP   [facility] Providers Cardiologist:  Candyce Reek, MD  Electrophysiologist:  Will Gladis Norton, MD      Chief Complaint:  ICD firing   Patient Profile: Courtney Davies is a 48 y.o. female with prior cardiac arrest in 2022 s/p ICD who is being seen 02/29/2024 for the evaluation of ICD firing.  History of Present Illness: Courtney Davies recently admitted with general malaise, nausea/vomiting, dizziness, and ICD firing for both AF in the 190s and monomorphic VT.  She was loaded on amiodarone  and discharged on 11/12 after converting to SR.  Echo showed preserved EF.  MRI from 2022 showed no abnormal delayed enhancement pattern  Returned today with ICD shocks x4, one witnessed by EMS and was noted to be in a wide complex tachycardia.  No preceding chest pain but has been sore since shocks from last week. ED ECG showed Afib.  ED labs K 3.6 and Mg 1.6, both ordered for repletion She reports inability to keep food down and vomiting after every meal which may be why her electrolytes are a little low.  Also c/o consistent abdominal pain tender to palpation IV amiodarone  resumed CXR clear Last TSH from 2024 was normal, not checked recently   Past Medical History:  Diagnosis Date   AICD (automatic cardioverter/defibrillator) present    [employer] ICD   Anemia 09/2015   Anoxic brain injury (HCC)    Arthritis    knees (04/23/2017)   Asthma    teens; went away; came back (04/23/2017)   B12 deficiency anemia 04/23/2017   Cardiac arrest (HCC)    Chronic bronchitis (HCC)    Chronic low back pain with sciatica    Chronic lower back pain    Diabetes mellitus without complication (HCC)    Diabetes type 2, controlled (HCC)    Elevated ferritin level    Fatty liver    GERD (gastroesophageal reflux disease)     GERD with stricture    Headache    1-2/wk (04/23/2017)   History of blood transfusion plenty   related to anemia (04/23/2017)   Hypertension    Inguinal hernia    Low back pain    Migraine    1-2/month (04/23/2017)   OA (osteoarthritis) of knee--left    Paroxysmal atrial fibrillation (HCC)    Sub-clinical -seen on device?, elevated CHADSVASC   Symptomatic anemia 04/23/2017   Vitamin B 12 deficiency    Past Surgical History:  Procedure Laterality Date   ABDOMINAL HYSTERECTOMY  YRS AGO   COMPLETE   CESAREAN SECTION  1998; 2002   ESOPHAGEAL DILATION  02/2020   by Dr Marvis   INGUINAL HERNIA REPAIR Bilateral 1980s    total of 4 surgeries (04/23/2017)   IR GASTROSTOMY TUBE MOD SED  12/13/2020   KNEE ARTHROSCOPY WITH MEDIAL MENISECTOMY Left 01/30/2018   Procedure: LEFT KNEE ARTHROSCOPY WITH PARTIAL LATERAL MENISCECTOMY;  Surgeon: Vernetta Lonni GRADE, MD;  Location: [facility];  Service: Orthopedics;  Laterality: Left;   KNEE CARTILAGE SURGERY Left    SUBQ ICD IMPLANT N/A 09/13/2021   Procedure: SUBQ ICD IMPLANT;  Surgeon: Norton Soyla Gladis, MD;  Location[facility] INVASIVE CV LAB;  Service: Cardiovascular;  Laterality: N/A;   TUBAL LIGATION  2002     Medications Prior to Admission: Prior to Admission medications   Medication Sig Start Date  End Date Taking? Authorizing Provider  Accu-Chek Softclix Lancets lancets Check blood sugars once daily Dx code E10.9 12/26/23   [name], [name], NP  acetaminophen  (TYLENOL ) 325 MG tablet Take 2 tablets (650 mg total) by mouth every 6 (six) hours as needed for headache or mild pain (pain score 1-3). 02/26/24   Aniceto Daphne [name], NP  apixaban  (ELIQUIS ) 5 MG TABS tablet Take 1 tablet (5 mg total) by mouth 2 (two) times daily. 02/20/24   Lesia Ozell [name], PA-C  BD PEN NEEDLE NANO 2ND GEN 32G X 4 MM MISC 3 (three) times daily. as directed 07/11/21   [provider]  Blood Glucose Monitoring Suppl (ACCU-CHEK GUIDE ME) w/Device KIT 1 kit by Does  not apply route as directed. Check blood sugars once daily Dx code E11.65 12/26/23   Gil No E, NP  carvedilol  (COREG ) 6.25 MG tablet TAKE 1 TABLET(6.25 MG) BY MOUTH TWICE DAILY 11/14/23   [name], [name], NP  cetirizine  (ZYRTEC ) 10 MG tablet Take 1 tablet (10 mg total) by mouth daily. 11/28/23   [name], [name], NP  docusate sodium  (COLACE) 100 MG capsule Take 1 capsule (100 mg total) by mouth daily as needed. 01/22/24 01/21/25  Jule Ronal [name], PA-C  doxycycline  (VIBRA -TABS) 100 MG tablet Take 1 tablet (100 mg total) by mouth 2 (two) times daily. To be taken after surgery 01/22/24   Jule Ronal [name], PA-C  gabapentin  (NEURONTIN ) 100 MG capsule Take 100 mg by mouth 2 (two) times daily.    [provider]  hydrOXYzine  (ATARAX ) 10 MG tablet TAKE 1 TABLET(10 MG) BY MOUTH AT BEDTIME 11/18/23   [name], [name], NP  lamoTRIgine  (LAMICTAL ) 100 MG tablet TAKE 1 TABLET BY MOUTH TWICE DAILY 02/28/24   Babs Arthea [name], MD  linaclotide  (LINZESS ) 145 MCG CAPS capsule Take 145 mcg by mouth daily as needed (constipation).    [provider]  losartan  (COZAAR ) 25 MG tablet TAKE 1 TABLET BY MOUTH DAILY 01/21/24   [name], [name], NP  metformin (FORTAMET) 500 MG (OSM) 24 hr tablet Take 500 mg by mouth in the morning and at bedtime.    [provider]  methocarbamol  (ROBAXIN ) 750 MG tablet Take 1 tablet (750 mg total) by mouth 3 (three) times daily as needed. Patient taking differently: Take 750 mg by mouth at bedtime. 01/22/24   Jule Ronal [name], PA-C  omeprazole (PRILOSEC) 40 MG capsule Take 40 mg by mouth in the morning and at bedtime.    [provider]  ondansetron  (ZOFRAN ) 4 MG tablet Take 1 tablet (4 mg total) by mouth every 8 (eight) hours as needed for nausea or vomiting. 01/22/24   Jule Ronal [name], PA-C  OZEMPIC, 2 MG/DOSE, 8 MG/3ML SOPN Inject 2 mg into the skin once a week. 02/19/24   [provider]  QUEtiapine  150 MG TABS Take 150 mg by mouth at bedtime. 01/09/24   Arfeen, Leni [name], MD  rivastigmine  (EXELON ) 3 MG capsule TAKE 1 CAPSULE(3 MG) BY MOUTH TWICE DAILY 08/09/23   Babs Arthea [name], MD  rosuvastatin  (CRESTOR ) 20 MG tablet Take 1.5 tablets (30 mg total) by mouth daily. 09/26/23   [name], [name], NP  sertraline  (ZOLOFT ) 100 MG tablet Take 1 tablet (100 mg total) by mouth daily. 01/09/24   Arfeen, Syed T, MD     Allergies:    Allergies  Allergen Reactions   Latex Hives   Other Other (See Comments), Anaphylaxis, Swelling and Hives    Hair  glue causes throat to close and hives glue Hair glue causes throat to close    Codeine  Nausea And Vomiting    Was on an empty stomach.   Nitrofurantoin Rash    Social History:   Social History   Socioeconomic History   Marital status: Single    Spouse name: Not on file   Number of children: 2   Years of education: 11th   Highest education level: Not on file  Occupational History   Occupation: scientist, product/process development in restaurant  Tobacco Use   Smoking status: Every Day    Types: Cigarettes   Smokeless tobacco: Never  Vaping Use   Vaping status: Never Used  Substance and Sexual Activity   Alcohol use: Not Currently    Comment: occ   Drug use: Yes    Types: Marijuana    Comment: daily   Sexual activity: Yes    Birth control/protection: Surgical  Other Topics Concern   Not on file  Social History Narrative   Lives at home with children   Right-handed.   Social Drivers of Corporate Investment Banker Strain: High Risk (08/02/2023)   Overall Financial Resource Strain (CARDIA)    Difficulty of Paying Living Expenses: Very hard  Food Insecurity: Food Insecurity Present (02/28/2024)   Hunger Vital Sign    Worried About Running Out of Food in the Last Year: Sometimes true    Ran Out of Food in the Last Year: Sometimes true  Transportation Needs: No Transportation Needs (02/28/2024)   PRAPARE - Administrator, Civil Service (Medical): No    Lack of Transportation (Non-Medical): No  Physical Activity: Not  on file  Stress: Stress Concern Present (05/15/2023)   [employer] of Occupational Health - Occupational Stress Questionnaire    Feeling of Stress : To some extent  Social Connections: Unknown (07/04/2023)   Social Connection and Isolation Panel    Frequency of Communication with Friends and Family: Never    Frequency of Social Gatherings with Friends and Family: Never    Attends Religious Services: 1 to 4 times per year    Active Member of [employer] or Organizations: No    Attends Banker Meetings: Never    Marital Status: Patient declined  Catering Manager Violence: Not At Risk (02/28/2024)   Humiliation, Afraid, Rape, and Kick questionnaire    Fear of Current or Ex-Partner: No    Emotionally Abused: No    Physically Abused: No    Sexually Abused: No     Family History:   The patient's family history includes Anemia in her paternal grandmother; Diabetes in her father, maternal grandmother, and mother; Hypertension in her mother; Prostate cancer in her maternal uncle; Stroke in her maternal grandmother and mother; Valvular heart disease in her paternal grandmother.    ROS:  Please see the history of present illness.  All other ROS reviewed and negative.     Physical Exam/Data: Vitals:   02/29/24 1711 02/29/24 1713 02/29/24 1715  BP: (!) 161/116  (!) 144/87  Pulse: 97  65  Resp: (!) 28  14  Temp: 97.7 F (36.5 C)    SpO2: 100%  100%  Weight:  88.5 kg   Height:  5' 6 (1.676 m)    No intake or output data in the 24 hours ending 02/29/24 1852    02/29/2024    5:13 PM 02/23/2024    6:15 PM 02/23/2024    3:15 PM  Last 3 Weights  Weight (lbs) 195 lb 210 lb 15.7 oz 181 lb  Weight (kg) 88.451 kg 95.7 kg 82.101 kg     Body mass index is 31.47 kg/m.  General:  Well nourished, well developed, in no acute distress HEENT: normal Neck: no JVD Vascular: No carotid bruits; Distal pulses 2+ bilaterally   Cardiac:  normal S1, S2; RRR; no murmur  Lungs:  clear to  auscultation bilaterally, no wheezing, rhonchi or rales  Abd: soft, tender in all 4 quadrants to palpation, bowel sounds heard  Ext: no edema Musculoskeletal:  No deformities, BUE and BLE strength normal and equal Skin: warm and dry  Neuro:  CNs 2-12 intact, no focal abnormalities noted Psych:  Normal affect    Laboratory Data: High Sensitivity Troponin:   Recent Labs  Lab 02/23/24 1534 02/23/24 1734 02/29/24 1716  TROPONINIHS 7 9 4       Chemistry Recent Labs  Lab 02/23/24 1534 02/23/24 1734 02/29/24 1716  NA 138  --  138  K 4.3  --  3.6  CL 110  --  109  CO2 14*  --  18*  GLUCOSE 82  --  112*  BUN 16  --  17  CREATININE 0.94  --  0.96  CALCIUM  9.2  --  9.1  MG  --  1.8 1.6*  GFRNONAA >60  --  >60  ANIONGAP 14  --  11    Recent Labs  Lab 02/29/24 1716  PROT 7.5  ALBUMIN 3.8  AST 23  ALT 17  ALKPHOS 96  BILITOT 0.5   Lipids No results for input(s): CHOL, TRIG, HDL, LABVLDL, LDLCALC, CHOLHDL in the last 168 hours. Hematology Recent Labs  Lab 02/23/24 1534 02/29/24 1716  WBC 5.7 5.5  RBC 5.85* 5.72*  HGB 13.1 13.0  HCT 42.7 41.6  MCV 73.0* 72.7*  MCH 22.4* 22.7*  MCHC 30.7 31.3  RDW 16.0* 15.9*  PLT 299 272   Thyroid  No results for input(s): TSH, FREET4 in the last 168 hours. BNPNo results for input(s): BNP, PROBNP in the last 168 hours.  DDimer No results for input(s): DDIMER in the last 168 hours.  Radiology/Studies:  No results found.   Assessment and Plan: Recurrent VT and Afib. For VT, unclear precipitant, doubt ischemia as etiology.  Will resume amiodarone  in IV form for tonight.  Electrolytes repleted.  Will check TSH as none checked in a few years.  Device interrogation and EP to consider further evaluation in the morning.  May be exacerbated by electrolyte derangement, and she has had recurrent vomiting.  I do see an abdominal CT ordered already for her, she had a RUQ ultrasound in July that was unremarkable. LFTs ok,  adding a lipase  From AF standpoint, continue home apixaban .  Also continue BB as HR still elevated.      Risk Assessment/Risk Scores:        Code Status: Full Code  Severity of Illness: The appropriate patient status for this patient is INPATIENT. Inpatient status is judged to be reasonable and necessary in order to provide the required intensity of service to ensure the patient's safety. The patient's presenting symptoms, physical exam findings, and initial radiographic and laboratory data in the context of their chronic comorbidities is felt to place them at high risk for further clinical deterioration. Furthermore, it is not anticipated that the patient will be medically stable for discharge from the hospital within 2 midnights of admission.   * I certify that at the point of admission  it is my clinical judgment that the patient will require inpatient hospital care spanning beyond 2 midnights from the point of admission due to high intensity of service, high risk for further deterioration and high frequency of surveillance required.*  For questions or updates, please contact [facility] Please consult [email or web address] for contact info under       Signed, Andee Flatten, MD  02/29/2024 6:52 PM

## 2024-02-29 NOTE — ED Triage Notes (Signed)
 Patient presents to the ER via EMS from home. Patient c/o chest pain x4 days. Patient reports that her Defib has shocked her a total of 4 times today. Per EMS patient has gone into V-tach when moving, and witnessed a shock and has been in A-fib since.

## 2024-02-29 NOTE — ED Provider Notes (Signed)
 [place] EMERGENCY DEPARTMENT AT [facility] Provider Note   CSN: [id number] Arrival date & time: 02/29/24  1701     Patient presents with: Dizziness   Courtney Davies is a 48 y.o. female with past medical history of cardiac arrest status post ICD presenting to the emergency department for ICD firing.  Patient reports today her ICD is fired 4 times.  Prior to that she denies chest pain however endorses generalized malaise and decreased appetite.  Patient was taking a shower when she felt her ICD discharge for the first time today.  And route with EMS, her patient was noticed to be in V. tach with the floor shock delivered.  On arrival to the ED, patient endorses significant chest pain following ICD discharges.  She denies shortness of breath, dizziness, or passing out.  {Add pertinent medical, surgical, social history, OB history to HPI:32947}  Dizziness      Prior to Admission medications   Medication Sig Start Date End Date Taking? Authorizing Provider  Accu-Chek Softclix Lancets lancets Check blood sugars once daily Dx code E10.9 12/26/23   [name], [name], NP  acetaminophen  (TYLENOL ) 325 MG tablet Take 2 tablets (650 mg total) by mouth every 6 (six) hours as needed for headache or mild pain (pain score 1-3). 02/26/24   Aniceto Daphne [name], NP  apixaban  (ELIQUIS ) 5 MG TABS tablet Take 1 tablet (5 mg total) by mouth 2 (two) times daily. 02/20/24   Lesia Ozell [name], PA-C  BD PEN NEEDLE NANO 2ND GEN 32G X 4 MM MISC 3 (three) times daily. as directed 07/11/21   [provider]  Blood Glucose Monitoring Suppl (ACCU-CHEK GUIDE ME) w/Device KIT 1 kit by Does not apply route as directed. Check blood sugars once daily Dx code E11.65 12/26/23   Gil No E, NP  carvedilol  (COREG ) 6.25 MG tablet TAKE 1 TABLET(6.25 MG) BY MOUTH TWICE DAILY 11/14/23   [name], [name], NP  cetirizine  (ZYRTEC ) 10 MG tablet Take 1 tablet (10 mg total) by mouth daily. 11/28/23   [name], [name], NP  docusate  sodium (COLACE) 100 MG capsule Take 1 capsule (100 mg total) by mouth daily as needed. 01/22/24 01/21/25  Jule Ronal [name], PA-C  doxycycline  (VIBRA -TABS) 100 MG tablet Take 1 tablet (100 mg total) by mouth 2 (two) times daily. To be taken after surgery 01/22/24   Jule Ronal [name], PA-C  gabapentin  (NEURONTIN ) 100 MG capsule Take 100 mg by mouth 2 (two) times daily.    [provider]  hydrOXYzine  (ATARAX ) 10 MG tablet TAKE 1 TABLET(10 MG) BY MOUTH AT BEDTIME 11/18/23   [name], [name], NP  lamoTRIgine  (LAMICTAL ) 100 MG tablet TAKE 1 TABLET BY MOUTH TWICE DAILY 02/28/24   Babs Arthea [name], MD  linaclotide  (LINZESS ) 145 MCG CAPS capsule Take 145 mcg by mouth daily as needed (constipation).    [provider]  losartan  (COZAAR ) 25 MG tablet TAKE 1 TABLET BY MOUTH DAILY 01/21/24   [name], [name], NP  metformin (FORTAMET) 500 MG (OSM) 24 hr tablet Take 500 mg by mouth in the morning and at bedtime.    [provider]  methocarbamol  (ROBAXIN ) 750 MG tablet Take 1 tablet (750 mg total) by mouth 3 (three) times daily as needed. Patient taking differently: Take 750 mg by mouth at bedtime. 01/22/24   Jule Ronal [name], PA-C  omeprazole (PRILOSEC) 40 MG capsule Take 40 mg by mouth in the morning and at bedtime.  [provider]  ondansetron  (ZOFRAN ) 4 MG tablet Take 1 tablet (4 mg total) by mouth every 8 (eight) hours as needed for nausea or vomiting. 01/22/24   Jule Ronal [name], PA-C  OZEMPIC, 2 MG/DOSE, 8 MG/3ML SOPN Inject 2 mg into the skin once a week. 02/19/24   [provider]  QUEtiapine  150 MG TABS Take 150 mg by mouth at bedtime. 01/09/24   Arfeen, Leni [name], MD  rivastigmine  (EXELON ) 3 MG capsule TAKE 1 CAPSULE(3 MG) BY MOUTH TWICE DAILY 08/09/23   Babs Arthea [name], MD  rosuvastatin  (CRESTOR ) 20 MG tablet Take 1.5 tablets (30 mg total) by mouth daily. 09/26/23   [name], [name], NP  sertraline  (ZOLOFT ) 100 MG tablet Take 1 tablet (100 mg total) by mouth daily. 01/09/24    Arfeen, Leni [name], MD    Allergies: Latex, Other, Codeine , and Nitrofurantoin    Review of Systems  Neurological:  Positive for dizziness.    Updated Vital Signs BP (!) 161/116   Pulse 97   Temp 97.7 F (36.5 C)   Resp (!) 28   LMP 12/03/2012   SpO2 100%   Physical Exam  (all labs ordered are listed, but only abnormal results are displayed) Labs Reviewed - No data to display  EKG: None  Radiology: No results found.  {Document cardiac monitor, telemetry assessment procedure when appropriate:32947} Procedures   Medications Ordered in the ED - No data to display    {Click here for ABCD2, HEART and other calculators REFRESH Note before signing:1}                              Medical Decision Making Amount and/or Complexity of Data Reviewed Labs: ordered. Radiology: ordered.  Risk Prescription drug management. Decision regarding hospitalization.   ***  {Document critical care time when appropriate  Document review of labs and clinical decision tools ie CHADS2VASC2, etc  Document your independent review of radiology images and any outside records  Document your discussion with family members, caretakers and with consultants  Document social determinants of health affecting pt's care  Document your decision making why or why not admission, treatments were needed:32947:::1}   Final diagnoses:  None    ED Discharge Orders     None

## 2024-02-29 NOTE — ED Provider Notes (Signed)
 Patient is a 48 year old female with significant cardiac history with a defibrillator in place who is being brought in by EMS today due to being shocked 4 times today.  Denied symptoms prior to the shock.  3 prior to EMS arriving but then EMS reports her last shock was when they were present.  Patient went into V. tach and was delivered a shock.  She was awake throughout this episode and she has been in atrial fibrillation since this time.  She recently was discharged after having similar symptoms.  Currently patient is complaining of chest pain from being shocked and is upset that she is here again with similar symptoms.   Doretha Folks, MD 03/05/24 [phone]

## 2024-03-01 ENCOUNTER — Inpatient Hospital Stay (HOSPITAL_COMMUNITY)

## 2024-03-01 ENCOUNTER — Encounter (HOSPITAL_COMMUNITY): Payer: Self-pay | Admitting: Cardiothoracic Surgery

## 2024-03-01 DIAGNOSIS — I472 Ventricular tachycardia, unspecified: Secondary | ICD-10-CM | POA: Diagnosis not present

## 2024-03-01 DIAGNOSIS — R112 Nausea with vomiting, unspecified: Secondary | ICD-10-CM | POA: Diagnosis present

## 2024-03-01 DIAGNOSIS — Z8669 Personal history of other diseases of the nervous system and sense organs: Secondary | ICD-10-CM

## 2024-03-01 DIAGNOSIS — E119 Type 2 diabetes mellitus without complications: Secondary | ICD-10-CM

## 2024-03-01 DIAGNOSIS — F1290 Cannabis use, unspecified, uncomplicated: Secondary | ICD-10-CM

## 2024-03-01 DIAGNOSIS — K5900 Constipation, unspecified: Secondary | ICD-10-CM | POA: Diagnosis not present

## 2024-03-01 DIAGNOSIS — I480 Paroxysmal atrial fibrillation: Secondary | ICD-10-CM | POA: Diagnosis not present

## 2024-03-01 DIAGNOSIS — Z4502 Encounter for adjustment and management of automatic implantable cardiac defibrillator: Secondary | ICD-10-CM | POA: Diagnosis not present

## 2024-03-01 DIAGNOSIS — Z8719 Personal history of other diseases of the digestive system: Secondary | ICD-10-CM

## 2024-03-01 MED ORDER — PROPRANOLOL HCL 20 MG PO TABS
20.0000 mg | ORAL_TABLET | Freq: Four times a day (QID) | ORAL | Status: DC
  Administered 2024-03-01 – 2024-03-02 (×5): 20 mg via ORAL
  Filled 2024-03-01 (×8): qty 1

## 2024-03-01 MED ORDER — SENNOSIDES-DOCUSATE SODIUM 8.6-50 MG PO TABS
1.0000 | ORAL_TABLET | Freq: Two times a day (BID) | ORAL | Status: DC
  Administered 2024-03-01 – 2024-03-05 (×7): 1 via ORAL
  Filled 2024-03-01 (×8): qty 1

## 2024-03-01 MED ORDER — SUCRALFATE 1 GM/10ML PO SUSP
1.0000 g | Freq: Three times a day (TID) | ORAL | Status: DC
  Administered 2024-03-01 (×2): 1 g via ORAL
  Filled 2024-03-01 (×3): qty 10

## 2024-03-01 MED ORDER — RIVASTIGMINE TARTRATE 1.5 MG PO CAPS
3.0000 mg | ORAL_CAPSULE | Freq: Two times a day (BID) | ORAL | Status: DC
  Administered 2024-03-01 – 2024-03-05 (×8): 3 mg via ORAL
  Filled 2024-03-01 (×9): qty 2

## 2024-03-01 MED ORDER — OXYCODONE-ACETAMINOPHEN 5-325 MG PO TABS
1.0000 | ORAL_TABLET | ORAL | Status: DC | PRN
  Administered 2024-03-01 – 2024-03-05 (×15): 1 via ORAL
  Filled 2024-03-01 (×16): qty 1

## 2024-03-01 MED ORDER — QUETIAPINE FUMARATE 100 MG PO TABS
100.0000 mg | ORAL_TABLET | Freq: Every day | ORAL | Status: DC
  Administered 2024-03-01 – 2024-03-04 (×4): 100 mg via ORAL
  Filled 2024-03-01 (×4): qty 1

## 2024-03-01 MED ORDER — MAGNESIUM SULFATE 2 GM/50ML IV SOLN
2.0000 g | Freq: Once | INTRAVENOUS | Status: AC
  Administered 2024-03-01: 2 g via INTRAVENOUS
  Filled 2024-03-01: qty 50

## 2024-03-01 NOTE — Consult Note (Signed)
 ELECTROPHYSIOLOGY CONSULT NOTE    Patient ID: Courtney Davies MRN: [id number], DOB/AGE: August 06, 1975 48 y.o.  Admit date: 02/29/2024 Date of Consult: 03/01/2024  Primary Physician: [name] BRAVO, NP Primary Cardiologist: NA Electrophysiologist: Dr. Inocencio  Patient Profile: Courtney Davies is a 48 y.o. female with a history of VF arrest in 11/2020 s/p [facility] S-ICD 08/2021, anoxic injury, NICM, HTN, DM II   who is being seen today for the evaluation of ICD shock at the request of Dr. Cesario.  HPI:  Courtney Davies is a 48 y.o. female with a history of cardiac arrest and a [employer] subcutaneous ICD, type 2 diabetes, GERD, subclinical atrial fibrillation.  She was discharged on November 12 after hospitalization for ICD shock that occurred after 2 weeks of nausea and recurrent emesis.  She was appropriately shocked, it appears, for VT triggered initially by atrial fibrillation with RVR.  She was started on amiodarone  for both atrial and ventricular arrhythmias.  She converted to normal sinus rhythm and was discharged on amiodarone .    Past Medical History:  Diagnosis Date   AICD (automatic cardioverter/defibrillator) present    [employer] ICD   Anemia 09/2015   Anoxic brain injury (HCC)    Arthritis    knees (04/23/2017)   Asthma    teens; went away; came back (04/23/2017)   B12 deficiency anemia 04/23/2017   Cardiac arrest (HCC)    Chronic bronchitis (HCC)    Chronic low back pain with sciatica    Chronic lower back pain    Diabetes mellitus without complication (HCC)    Diabetes type 2, controlled (HCC)    Elevated ferritin level    Fatty liver    GERD (gastroesophageal reflux disease)    GERD with stricture    Headache    1-2/wk (04/23/2017)   History of blood transfusion plenty   related to anemia (04/23/2017)   Hypertension    Inguinal hernia    Low back pain    Migraine    1-2/month (04/23/2017)   OA (osteoarthritis) of knee--left    Paroxysmal  atrial fibrillation (HCC)    Sub-clinical -seen on device?, elevated CHADSVASC   Symptomatic anemia 04/23/2017   Vitamin B 12 deficiency       Home medications (Not in a hospital admission)     Physical Exam: Vitals:   02/29/24 2207 03/01/24 0000 03/01/24 0533 03/01/24 0755  BP: (!) 164/98 (!) 163/94 (!) 144/85   Pulse: 67 65 69   Resp: (!) 21 (!) 23 12   Temp:    98.4 F (36.9 C)  TempSrc:    Oral  SpO2: 100% 100% 100%   Weight:      Height:        Gen: Appears comfortable, well-nourished CV: RRR, no dependent edema Pulm: breathing easily  PERTINENT STUDIES SUMMARIZED:  Echocardiogram:      03/05/2024: LVEF 55 to 60%.  Normal structure and function.  Heart Cath:    Imaging: Cardiac MRI March 14, 2021 --normal LV size, mild hypertrophy.  EF 53%.  Normal RV size and function.  No LGE.  Coronary CT October 2022: Coronary calcium  score of 0    EKG:   11/15 -atrial fibrillation, old anterior infarct (personally reviewed)  TELEMETRY:    Sinus rhythm (personally reviewed)  DEVICE HISTORY:  Subcutaneous ICD implanted Sep 13, 2021   ASSESSMENT & PLAN:  ICD shock Will obtain device interrogation Suspect recurrence of VT IV amiodarone  load Will switch from  carvedilol  to propranolol (no significant cardiomyopathy).  Would push propranolol for improved rate control for A-fib.  Atrial fibrillation ECG 02/29/16:08:26 Possibly organizing into flutter -- EMS run sheet, last ECG (02/28/21 4:23:55) Continue amiodarone  Continue Eliquis  for CHA2DS2-VASc score of 5  Electrolytes Replace Mg aggressively  Nausea, poor appetite DC ozempic Will consult IM Will need to look into lamictal  & neurontin  -- patient unsure why she takes these. ?headaches Lipase ok  Anoxic brain injury From initial VF arrest  CHF recovered EF EF was 40 to 45% at the time of her initial cardiac arrest No evidence of coronary disease     For questions or updates, please contact  [facility] Please consult [email or web address] for contact info under Cardiology/STEMI.  Signed, Eulas Furbish, MD 03/01/2024 8:34 AM

## 2024-03-01 NOTE — Consult Note (Addendum)
 Initial Consultation Note   Patient: Courtney Davies FMW:[id number] DOB: 16-Jun-1975 PCP: [name] BRAVO, NP DOA: 02/29/2024 DOS: the patient was seen and examined on 03/01/2024 Primary service: [name], MD  Referring physician: Dr. Inocencio, MD Reason for consult: Nausea and decreased appetite   Assessment and Plan:  ICD shock History of VT - Per cardiology  Paroxysmal atrial fibrillation CHA2DS2-VASc score =5 - Continued on Eliquis  and amiodarone   Nausea/vomiting Constipation Over the last 1-2 weeks patient had progressively had nausea and vomiting.  Emesis is nonbloody.  Denies having any significant abdominal pain.  LFTs including lipase within normal limits.  Patient reported last bowel movement approximately 3 to 4 days ago.  Her mother reports that she had been given stool softeners and Linzess  with bowel movement earlier in the week for which patient had had bowel movement and not had any reports of vomiting since.  Suspect that symptoms could be secondary to constipation/ileus.  On differential includes gastritis/reflux, medications (Ozempic, metformin, gabapentin ), gastroparesis with a history of diabetes, and /or cannabinoid hyperemesis. - Monitor intake and output - Check abdominal x-ray - Start bowel regimen Senokot twice daily - Continue supportive care recommended - Advised patient on the need to follow-up in the outpatient setting with gastroenterology   History of gastritis, gastric polyp, and colon polyp Patient with prior history of colonoscopy and EGD which noted 18 mm gastric polyp, gastritis, 5 mm polyp in the descending colon, and nonbleeding internal hemorrhoids.  Polyps were resected and pathology was noted to be benign. - Carafate added 3 times daily with meals if symptoms secondary to gastritis may help coat stomach in the acute setting while in the inpatient setting   - Would recommend continuing Protonix  at discharge  Controlled diabetes  mellitus, without long-term use of insulin  Patient appears to be relatively well-controlled.  Hemoglobin A1c 5.9.  Home medication regimen includes metformin and Ozempic.  Patient has been on these medications for quite some time which would make me think this is less likely to be a cause for nausea and vomiting. - Hold metformin  History of anoxic brain injury  Review of records note with severe agitation and behavioral disturbances previously.  As a result of anoxic brain injury she also reports having difficulty with her memory.  Mood disorder Major depressive disorder Medications including Zoloft , Lamictal , Seroquel  are prescribed by Dr. Curry Paterson psychiatry for patient's history of anoxic brain injury with mood disorder   - Would recommend continuation of these medications and have her follow-up with her psychiatrist in the outpatient setting to be weaned off if she so chooses  Memory loss Patient admits to having poor memory.  Rivastigmine  is on board for treatment of this. - Continue rivastigmine   Neuropathy Patient reported complaints of numbness and tingling which gabapentin  was prescribed. - Continue gabapentin , but this could likely be easily tapered off if patient chooses  Marijuana use disorder UDS positive for marijuana.  This could be a possible cause for cyclical vomiting. - Advised patient on need of cessation of marijuana use  TRH will continue to follow the patient.  HPI: Courtney Davies is a 48 y.o. female with past medical history of hypertension, paroxysmal atrial fibrillation, NICM, cardiac arrest s/p ICD, anoxic brain injury, and diabetes mellitus type group who presents for recurrent ICD shocks.  She has been experiencing recurrent shocks from her ICD, which began yesterday. The shocks occur every few seconds, with the first episode involving three shocks and the most recent  episode involving four shocks. The severity of the shocks prompted a call to 911, and  she has been hospitalized for this issue previously.  In addition to the ICD shocks, she is experiencing nausea. While she has been able to eat more this week without vomiting, she typically vomits after eating, especially when eating at her mother's house. This issue has been ongoing for approximately two weeks, with improvement noted this week. She has been taking medication to manage the nausea.  She has a history of gastritis and 18 mm semisessile polyp, which were removed approximately nine years ago. She had temporarily required a feeding tube after having her anoxic brain injury. She also reports a history of constipation, requiring stool softeners and Linzess , with her last bowel movement occurring three to four days ago.  She is on multiple medications, including Ozempic for A1c management, and reports frequent changes in her medication regimen. She is also taking Tylenol  for headaches and medication for nausea. She expresses frustration with the number of medications she is taking, which she estimates to be over twenty per day.  She plans on attempting to quit marijuana use, but reports that she was not using it on a regular basis.    Review of Systems: As mentioned in the history of present illness. All other systems reviewed and are negative. Past Medical History:  Diagnosis Date   AICD (automatic cardioverter/defibrillator) present    [employer] ICD   Anemia 09/2015   Anoxic brain injury (HCC)    Arthritis    knees (04/23/2017)   Asthma    teens; went away; came back (04/23/2017)   B12 deficiency anemia 04/23/2017   Cardiac arrest (HCC)    Chronic bronchitis (HCC)    Chronic low back pain with sciatica    Chronic lower back pain    Diabetes mellitus without complication (HCC)    Diabetes type 2, controlled (HCC)    Elevated ferritin level    Fatty liver    GERD (gastroesophageal reflux disease)    GERD with stricture    Headache    1-2/wk (04/23/2017)   History  of blood transfusion plenty   related to anemia (04/23/2017)   Hypertension    Inguinal hernia    Low back pain    Migraine    1-2/month (04/23/2017)   OA (osteoarthritis) of knee--left    Paroxysmal atrial fibrillation (HCC)    Sub-clinical -seen on device?, elevated CHADSVASC   Symptomatic anemia 04/23/2017   Vitamin B 12 deficiency    Past Surgical History:  Procedure Laterality Date   ABDOMINAL HYSTERECTOMY  YRS AGO   COMPLETE   CESAREAN SECTION  1998; 2002   ESOPHAGEAL DILATION  02/2020   by Dr Marvis   INGUINAL HERNIA REPAIR Bilateral 1980s    total of 4 surgeries (04/23/2017)   IR GASTROSTOMY TUBE MOD SED  12/13/2020   KNEE ARTHROSCOPY WITH MEDIAL MENISECTOMY Left 01/30/2018   Procedure: LEFT KNEE ARTHROSCOPY WITH PARTIAL LATERAL MENISCECTOMY;  Surgeon: Vernetta Lonni GRADE, MD;  Location: [facility];  Service: Orthopedics;  Laterality: Left;   KNEE CARTILAGE SURGERY Left    SUBQ ICD IMPLANT N/A 09/13/2021   Procedure: SUBQ ICD IMPLANT;  Surgeon: [name], MD;  Location: [facility] INVASIVE CV LAB;  Service: Cardiovascular;  Laterality: N/A;   TUBAL LIGATION  2002   Social History:  reports that she has been smoking cigarettes. She has never used smokeless tobacco. She reports that she does not currently use alcohol. She reports  current drug use. Drug: Marijuana.  Allergies  Allergen Reactions   Latex Hives   Other Other (See Comments), Anaphylaxis, Swelling and Hives    Hair glue causes throat to close and hives glue Hair glue causes throat to close    Codeine  Nausea And Vomiting    Was on an empty stomach.   Nitrofurantoin Rash    Family History  Problem Relation Age of Onset   Stroke Mother    Diabetes Mother    Diabetes Father    Stroke Maternal Grandmother    Diabetes Maternal Grandmother    Anemia Paternal Grandmother    Valvular heart disease Paternal Grandmother    Hypertension Mother    Prostate cancer Maternal Uncle        ? intestinal  also    Prior to Admission medications   Medication Sig Start Date End Date Taking? Authorizing Provider  acetaminophen  (TYLENOL ) 325 MG tablet Take 2 tablets (650 mg total) by mouth every 6 (six) hours as needed for headache or mild pain (pain score 1-3). 02/26/24  Yes Ollis, Brandi L, NP  apixaban  (ELIQUIS ) 5 MG TABS tablet Take 1 tablet (5 mg total) by mouth 2 (two) times daily. 02/20/24  Yes Lesia Ozell [name], PA-C  carvedilol  (COREG ) 6.25 MG tablet TAKE 1 TABLET(6.25 MG) BY MOUTH TWICE DAILY 11/14/23  Yes [name], [name], NP  cetirizine  (ZYRTEC ) 10 MG tablet Take 1 tablet (10 mg total) by mouth daily. 11/28/23  Yes [name], [name], NP  docusate sodium  (COLACE) 100 MG capsule Take 1 capsule (100 mg total) by mouth daily as needed. 01/22/24 01/21/25 Yes Jule Ronal [name], PA-C  doxycycline  (VIBRA -TABS) 100 MG tablet Take 1 tablet (100 mg total) by mouth 2 (two) times daily. To be taken after surgery 01/22/24  Yes Jule Ronal [name], PA-C  gabapentin  (NEURONTIN ) 100 MG capsule Take 100 mg by mouth 2 (two) times daily.   Yes [provider]  hydrOXYzine  (ATARAX ) 10 MG tablet TAKE 1 TABLET(10 MG) BY MOUTH AT BEDTIME 11/18/23  Yes [name], [name], NP  lamoTRIgine  (LAMICTAL ) 100 MG tablet TAKE 1 TABLET BY MOUTH TWICE DAILY 02/28/24  Yes Babs Arthea [name], MD  linaclotide  (LINZESS ) 145 MCG CAPS capsule Take 145 mcg by mouth daily as needed (constipation).   Yes [provider]  losartan  (COZAAR ) 25 MG tablet TAKE 1 TABLET BY MOUTH DAILY 01/21/24  Yes [name], [name], NP  metformin (FORTAMET) 500 MG (OSM) 24 hr tablet Take 500 mg by mouth in the morning and at bedtime.   Yes [provider]  methocarbamol  (ROBAXIN ) 750 MG tablet Take 1 tablet (750 mg total) by mouth 3 (three) times daily as needed. Patient taking differently: Take 750 mg by mouth at bedtime. 01/22/24  Yes Jule Ronal [name], PA-C  omeprazole (PRILOSEC) 40 MG capsule Take 40 mg by mouth in the morning and at bedtime.   Yes  [provider]  ondansetron  (ZOFRAN ) 4 MG tablet Take 1 tablet (4 mg total) by mouth every 8 (eight) hours as needed for nausea or vomiting. 01/22/24  Yes Stanbery, Mary L, PA-C  OZEMPIC, 2 MG/DOSE, 8 MG/3ML SOPN Inject 2 mg into the skin once a week. 02/19/24  Yes [provider]  QUEtiapine  (SEROQUEL ) 100 MG tablet Take 100 mg by mouth at bedtime. 02/29/24  Yes [provider]  rivastigmine  (EXELON ) 3 MG capsule TAKE 1 CAPSULE(3 MG) BY MOUTH TWICE DAILY 08/09/23  Yes Babs Arthea [name], MD  rosuvastatin  (CRESTOR ) 20 MG  tablet Take 1.5 tablets (30 mg total) by mouth daily. 09/26/23  Yes [name], [name], NP  sertraline  (ZOLOFT ) 100 MG tablet Take 1 tablet (100 mg total) by mouth daily. 01/09/24  Yes Arfeen, Leni [name], MD  Accu-Chek Softclix Lancets lancets Check blood sugars once daily Dx code E10.9 12/26/23   [name], [name], NP  BD PEN NEEDLE NANO 2ND GEN 32G X 4 MM MISC 3 (three) times daily. as directed 07/11/21   [provider]  Blood Glucose Monitoring Suppl (ACCU-CHEK GUIDE ME) w/Device KIT 1 kit by Does not apply route as directed. Check blood sugars once daily Dx code E11.65 12/26/23   [name] BRAVO, NP    Physical Exam: Vitals:   02/29/24 2207 03/01/24 0000 03/01/24 0533 03/01/24 0755  BP: (!) 164/98 (!) 163/94 (!) 144/85   Pulse: 67 65 69   Resp: (!) 21 (!) 23 12   Temp:    98.4 F (36.9 C)  TempSrc:    Oral  SpO2: 100% 100% 100%   Weight:      Height:         Constitutional: Young female who appears In no acute distress Eyes: PERRL, lids and conjunctivae normal ENMT: Mucous membranes are moist.  Normal dentition.  Neck: normal, supple  Respiratory: clear to auscultation bilaterally, no wheezing, no crackles. Normal respiratory effort. No accessory muscle use.  Cardiovascular: Regular rate and rhythm, no murmurs / rubs / gallops. No extremity edema.   Abdomen: no tenderness, no masses palpated.  Bowel sounds positive.  Healed scar from prior PEG tube.    Musculoskeletal: no clubbing / cyanosis. No joint deformity upper and lower extremities. Normal muscle tone.  Skin: no rashes, lesions, ulcers. No induration    Data Reviewed:    Reviewed labs, imaging, pertinent records as documented   Family Communication: Patient's mother updated over the phone Primary team communication:  Thank you very much for involving us  in the care of your patient.  Author: Maximino DELENA Sharps, MD 03/01/2024 10:03 AM  For on call review [email or web address].

## 2024-03-02 ENCOUNTER — Other Ambulatory Visit: Payer: Self-pay

## 2024-03-02 DIAGNOSIS — Z4502 Encounter for adjustment and management of automatic implantable cardiac defibrillator: Secondary | ICD-10-CM | POA: Diagnosis not present

## 2024-03-02 LAB — BASIC METABOLIC PANEL WITH GFR
Anion gap: 10 (ref 5–15)
BUN: 13 mg/dL (ref 6–20)
CO2: 23 mmol/L (ref 22–32)
Calcium: 8.9 mg/dL (ref 8.9–10.3)
Chloride: 103 mmol/L (ref 98–111)
Creatinine, Ser: 0.83 mg/dL (ref 0.44–1.00)
GFR, Estimated: >60 mL/min (ref >60)
Glucose, Bld: 84 mg/dL (ref 70–99)
Potassium: 4.1 mmol/L (ref 3.5–5.1)
Sodium: 136 mmol/L (ref 135–145)

## 2024-03-02 LAB — MAGNESIUM: Magnesium: 1.8 mg/dL (ref 1.7–2.4)

## 2024-03-02 MED ORDER — LINACLOTIDE 145 MCG PO CAPS
145.0000 ug | ORAL_CAPSULE | Freq: Every day | ORAL | Status: DC
  Administered 2024-03-02 – 2024-03-05 (×4): 145 ug via ORAL
  Filled 2024-03-02 (×4): qty 1

## 2024-03-02 MED ORDER — SUMATRIPTAN 20 MG/ACT NA SOLN
20.0000 mg | Freq: Once | NASAL | Status: AC
  Administered 2024-03-02: 20 mg via NASAL
  Filled 2024-03-02: qty 1

## 2024-03-02 MED ORDER — SUCRALFATE 1 G PO TABS
1.0000 g | ORAL_TABLET | Freq: Three times a day (TID) | ORAL | Status: DC
  Administered 2024-03-02 – 2024-03-05 (×11): 1 g via ORAL
  Filled 2024-03-02 (×12): qty 1

## 2024-03-02 NOTE — Progress Notes (Signed)
 Mobility Specialist Progress Note:    03/02/24 1356  Mobility  Activity Turned to back - supine (Ankle Pumps, Heel Slides, Leg lifts)  Level of Assistance Standby assist, set-up cues, supervision of patient - no hands on  Assistive Device None  Range of Motion/Exercises Active;Left leg;Right leg  Activity Response Tolerated well  Mobility Referral Yes  Mobility visit 1 Mobility  Mobility Specialist Start Time (ACUTE ONLY) 1356  Mobility Specialist Stop Time (ACUTE ONLY) 1409  Mobility Specialist Time Calculation (min) (ACUTE ONLY) 13 min   Received pt laying in bed irritated and refusing session initially. Once she vented her emotions, she agreed to bed mobility. Pt c/o migraine and hunger but otherwise doing ok. Asymptomatic w/ HR and issues. Pt able to perform movements decently. Left pt in bed w/ all needs met.   Venetia Keel Mobility Specialist Please Neurosurgeon or Rehab Office at [phone]

## 2024-03-02 NOTE — Progress Notes (Signed)
 PROGRESS NOTE    Courtney Davies  FMW:[id number] DOB: June 22, 1975 DOA: 02/29/2024 PCP: [name] BRAVO, NP  Outpatient Specialists:     Brief Narrative:  Patient is a 48 year old female past medical history significant for hypertension, paroxysmal atrial fibrillation, NICM, cardiac arrest s/p ICD, anoxic brain injury, and diabetes mellitus type group who was admitted following recurrent ICD shocks.  EP team is directing care.  TRH was consulted for nausea.  Nausea has resolved.  03/02/2024: Nausea has resolved.  Patient reports migraine-like headache.   Assessment & Plan:   Principal Problem:   ICD (implantable cardioverter-defibrillator) discharge Active Problems:   Cannabis use disorder   VT (ventricular tachycardia) (HCC)   Nausea and vomiting   Constipation   PAF (paroxysmal atrial fibrillation) (HCC)   History of gastritis   Controlled type 2 diabetes mellitus without complication, without long-term current use of insulin  (HCC)   History of anoxic brain injury   Nausea: - Resolved.  Headache: -Migraine-like headache. -Nasal Imitrex 20 Mg x 1 dose. -Continue to monitor closely.  Diabetes mellitus: - Continue to monitor and optimize.  Chronic medical problems: History of anoxic brain injury: Depression: Memory loss: Neuropathy: Marijuana use disorder:  ICD shocks/A-fib/VT: As per primary team.  DVT prophylaxis: Apixaban . Code Status: Full code. Family Communication:  Disposition Plan:    Subjective: Reports migraine-like headache  Objective: Vitals:   03/02/24 0800 03/02/24 1155 03/02/24 1655 03/02/24 1946  BP:  (!) 150/111  (!) 155/98  Pulse:  65 66   Resp: 16 16 18 20   Temp: 97.7 F (36.5 C) 97.6 F (36.4 C) 97.7 F (36.5 C) 97.8 F (36.6 C)  TempSrc: Oral Oral Oral Oral  SpO2:  100%  100%  Weight:      Height:        Intake/Output Summary (Last 24 hours) at 03/02/2024 1956 Last data filed at 03/02/2024 1900 Gross per 24 hour  Intake  913.5 ml  Output 700 ml  Net 213.5 ml   Filed Weights   02/29/24 1713  Weight: 88.5 kg    Examination:  General exam: Appears calm and comfortable  Respiratory system: Clear to auscultation. Respiratory effort normal. Cardiovascular system: S1 & S2 heard. Gastrointestinal system: Abdomen is soft and nontender  Central nervous system: Awake and alert.     Data Reviewed: I have personally reviewed following labs and imaging studies  CBC: Recent Labs  Lab 02/29/24 1716  WBC 5.5  NEUTROABS 2.6  HGB 13.0  HCT 41.6  MCV 72.7*  PLT 272   Basic Metabolic Panel: Recent Labs  Lab 02/29/24 1716 03/02/24 0810  NA 138 136  K 3.6 4.1  CL 109 103  CO2 18* 23  GLUCOSE 112* 84  BUN 17 13  CREATININE 0.96 0.83  CALCIUM  9.1 8.9  MG 1.6* 1.8   GFR: Estimated Creatinine Clearance: 93.9 mL/min (by C-G formula based on SCr of 0.83 mg/dL). Liver Function Tests: Recent Labs  Lab 02/29/24 1716  AST 23  ALT 17  ALKPHOS 96  BILITOT 0.5  PROT 7.5  ALBUMIN 3.8   Recent Labs  Lab 02/29/24 2208  LIPASE 29   No results for input(s): AMMONIA in the last 168 hours. Coagulation Profile: No results for input(s): INR, PROTIME in the last 168 hours. Cardiac Enzymes: No results for input(s): CKTOTAL, CKMB, CKMBINDEX, TROPONINI in the last 168 hours. BNP (last 3 results) No results for input(s): PROBNP in the last 8760 hours. HbA1C: No results for input(s): HGBA1C in the  last 72 hours. CBG: No results for input(s): GLUCAP in the last 168 hours. Lipid Profile: No results for input(s): CHOL, HDL, LDLCALC, TRIG, CHOLHDL, LDLDIRECT in the last 72 hours. Thyroid  Function Tests: Recent Labs    02/29/24 1716  TSH 2.067   Anemia Panel: No results for input(s): VITAMINB12, FOLATE, FERRITIN, TIBC, IRON, RETICCTPCT in the last 72 hours. Urine analysis:    Component Value Date/Time   COLORURINE YELLOW 09/24/2023 1605   APPEARANCEUR CLEAR  09/24/2023 1605   LABSPEC 1.020 09/24/2023 1605   PHURINE 7.0 09/24/2023 1605   GLUCOSEU >=500 (A) 09/24/2023 1605   HGBUR NEGATIVE 09/24/2023 1605   BILIRUBINUR NEGATIVE 09/24/2023 1605   BILIRUBINUR postive 07/12/2023 1623   KETONESUR NEGATIVE 09/24/2023 1605   PROTEINUR NEGATIVE 09/24/2023 1605   UROBILINOGEN 0.2 07/12/2023 1623   UROBILINOGEN 1.0 02/21/2014 1544   NITRITE NEGATIVE 09/24/2023 1605   LEUKOCYTESUR NEGATIVE 09/24/2023 1605   Sepsis Labs: @LABRCNTIP (procalcitonin:4,lacticidven:4)  ) Recent Results (from the past 240 hours)  MRSA Next Gen by PCR, Nasal     Status: None   Collection Time: 02/23/24  6:19 PM   Specimen: Nasal Mucosa; Nasal Swab  Result Value Ref Range Status   MRSA by PCR Next Gen NOT DETECTED NOT DETECTED Final    Comment: (NOTE) The GeneXpert MRSA Assay (FDA approved for NASAL specimens only), is one component of a comprehensive MRSA colonization surveillance program. It is not intended to diagnose MRSA infection nor to guide or monitor treatment for MRSA infections. Test performance is not FDA approved in patients less than 71 years old. Performed at [facility], [address], [place], [place] [address]          Radiology Studies: DG Abd Portable 1V Result Date: 03/01/2024 EXAM: 1 VIEW XRAY OF THE ABDOMEN 03/01/2024 02:25:00 PM COMPARISON: None available. CLINICAL HISTORY: Nausea and vomiting [id number]; [id number] Constipation [phone]. FINDINGS: BOWEL: Nonobstructive bowel gas pattern. Increased stool consistent with constipation. SOFT TISSUES: No opaque urinary calculi. BONES: No acute osseous abnormality. IMPRESSION: 1. Increased stool consistent with constipation. Electronically signed by: Fonda Field MD 03/01/2024 05:48 PM EST RP Workstation: [id number]        Scheduled Meds:  apixaban   5 mg Oral BID   gabapentin   100 mg Oral BID   lamoTRIgine   100 mg Oral BID   linaclotide   145 mcg Oral Daily   losartan   25 mg Oral Daily    pantoprazole   40 mg Oral Daily   propranolol  20 mg Oral QID   QUEtiapine   100 mg Oral QHS   rivastigmine   3 mg Oral BID   rosuvastatin   30 mg Oral Daily   senna-docusate  1 tablet Oral BID   sertraline   100 mg Oral Daily   sucralfate  1 g Oral TID WC & HS   SUMAtriptan  20 mg Nasal Once   Continuous Infusions:  amiodarone  30 mg/hr (03/02/24 1741)     LOS: 2 days    Time spent: 35 minutes    Leatrice Chapel, MD  [facility] Pager #: [phone] 7PM-7AM contact night coverage as above

## 2024-03-02 NOTE — Patient Outreach (Signed)
 Social Drivers of Health  Community Resource and Care Coordination Visit Note   03/02/2024  Name: TYRONDA VIZCARRONDO MRN: [id number] DOB:February 27, 1976  Situation: Referral received for [facility] needs assessment and assistance related to Financial Strain  Food Insecurity  Utilities. I obtained verbal consent from Patient.  Visit completed with Patient on the phone.   Background:      Assessment:   Goals Addressed             This Visit's Progress    BSW Goals       Current SDOH Barriers:  Limited access to food Utilities  Interventions: Patient interviewed and appropriate screenings performed Referred patient to community resources           Recommendation:   attend all scheduled provider appointments Reach out to resources provided  Follow Up Plan:   Patient has achieved all patient stated goals. [employer] will be closed. Patient has been provided contact information should new needs arise.   Orlean Fey, BSW [place]  [facility] Social Worker, [facility] [phone]

## 2024-03-02 NOTE — Patient Instructions (Signed)
 Visit Information  Thank you for taking time to visit with me today. Please don't hesitate to contact me if I can be of assistance to you before our next scheduled appointment.  Our next appointment is no further scheduled appointments.   Please call the care guide team at [phone] if you need to cancel or reschedule your appointment.   Following is a copy of your care plan:   Goals Addressed             This Visit's Progress    BSW Goals       Current SDOH Barriers:  Limited access to food Utilities  Interventions: Patient interviewed and appropriate screenings performed Referred patient to community resources           Please call the Suicide and Crisis Lifeline: 988 call the [place]  National Suicide Prevention Lifeline: 416-127-8983 or TTY: 438-804-9012 TTY (773)042-1499) to talk to a trained counselor call 1-800-273-TALK (toll free, 24 hour hotline) go to [facility] [address], [place] [phone]) call 911 if you are experiencing a Mental Health or Behavioral Health Crisis or need someone to talk to.  Patient verbalized understanding of Care plan and visit instructions communicated this visit  Orlean Fey, BSW [facility]  [facility] Social Worker, [facility] [phone]

## 2024-03-02 NOTE — Plan of Care (Signed)

## 2024-03-02 NOTE — Progress Notes (Addendum)
 Rounding Note   Patient Name: Courtney Davies Date of Encounter: 03/02/2024  [place] [facility] Cardiologist: Courtney Reek, MD   Subjective  no SOB A number of non-cardiac symptoms  Chest wall pain post shocks N/V, constipation, headaches, knee pain  Scheduled Meds:  apixaban   5 mg Oral BID   gabapentin   100 mg Oral BID   lamoTRIgine   100 mg Oral BID   losartan   25 mg Oral Daily   pantoprazole   40 mg Oral Daily   propranolol  20 mg Oral QID   QUEtiapine   100 mg Oral QHS   rivastigmine   3 mg Oral BID   rosuvastatin   30 mg Oral Daily   senna-docusate  1 tablet Oral BID   sertraline   100 mg Oral Daily   sucralfate  1 g Oral TID WC & HS   Continuous Infusions:  amiodarone  30 mg/hr (03/02/24 0127)   PRN Meds: acetaminophen , ondansetron  (ZOFRAN ) IV, oxyCODONE -acetaminophen    Vital Signs  Vitals:   03/01/24 2029 03/02/24 0000 03/02/24 0400 03/02/24 0800  BP: (!) 156/92 132/86 (!) 135/90   Pulse: 64 (!) 57 69   Resp: 16 16 15 16   Temp: 98.4 F (36.9 C) 97.7 F (36.5 C) 97.7 F (36.5 C) 97.7 F (36.5 C)  TempSrc: Oral Oral Oral Oral  SpO2: 100% 97% 98%   Weight:      Height:        Intake/Output Summary (Last 24 hours) at 03/02/2024 1130 Last data filed at 03/02/2024 0500 Gross per 24 hour  Intake 755.07 ml  Output --  Net 755.07 ml      02/29/2024    5:13 PM 02/23/2024    6:15 PM 02/23/2024    3:15 PM  Last 3 Weights  Weight (lbs) 195 lb 210 lb 15.7 oz 181 lb  Weight (kg) 88.451 kg 95.7 kg 82.101 kg      Telemetry  SR 60's-70's  - Personally Reviewed  ECG   No new EKGs - Personally Reviewed  Physical Exam  GEN: No acute distress.   Neck: No JVD Cardiac: RRR, no murmurs, rubs, or gallops.  Respiratory: Clear to auscultation bilaterally. GI: Soft, nontender, non-distended  MS: No edema; No deformity. Neuro:  Nonfocal  Psych: Normal affect   Labs High Sensitivity Troponin:   Recent Labs  Lab 02/23/24 1534 02/23/24 1734  02/29/24 1716 02/29/24 2208  TROPONINIHS 7 9 4 6      Chemistry Recent Labs  Lab 02/29/24 1716 03/02/24 0810  NA 138 136  K 3.6 4.1  CL 109 103  CO2 18* 23  GLUCOSE 112* 84  BUN 17 13  CREATININE 0.96 0.83  CALCIUM  9.1 8.9  MG 1.6* 1.8  PROT 7.5  --   ALBUMIN 3.8  --   AST 23  --   ALT 17  --   ALKPHOS 96  --   BILITOT 0.5  --   GFRNONAA >60 >60  ANIONGAP 11 10    Lipids No results for input(s): CHOL, TRIG, HDL, LABVLDL, LDLCALC, CHOLHDL in the last 168 hours.  Hematology Recent Labs  Lab 02/29/24 1716  WBC 5.5  RBC 5.72*  HGB 13.0  HCT 41.6  MCV 72.7*  MCH 22.7*  MCHC 31.3  RDW 15.9*  PLT 272   Thyroid   Recent Labs  Lab 02/29/24 1716  TSH 2.067    BNPNo results for input(s): BNP, PROBNP in the last 168 hours.  DDimer No results for input(s): DDIMER in the last 168 hours.  Radiology  DG Abd Portable 1V Result Date: 03/01/2024 EXAM: 1 VIEW XRAY OF THE ABDOMEN 03/01/2024 02:25:00 PM COMPARISON: None available. CLINICAL HISTORY: Nausea and vomiting [id number]; [id number] Constipation [phone]. FINDINGS: BOWEL: Nonobstructive bowel gas pattern. Increased stool consistent with constipation. SOFT TISSUES: No opaque urinary calculi. BONES: No acute osseous abnormality. IMPRESSION: 1. Increased stool consistent with constipation. Electronically signed by: Courtney Field MD 03/01/2024 05:48 PM EST RP Workstation: [id number]     Cardiac Studies  Echocardiogram:      03/05/2024: LVEF 55 to 60%.  Normal structure and function.   Heart Cath:    Imaging: Cardiac MRI March 14, 2021 --normal LV size, mild hypertrophy.  EF 53%.  Normal RV size and function.  No LGE.   Coronary CT October 2022: Coronary calcium  score of 0  Patient Profile   48 y.o. female w/PMHx of  C. Arrest > w/secondary prevention ICD Anoxic brain injury NICM HTN, DM  NOTED: was discharged on November 12 after hospitalization for ICD shock that occurred after 2 weeks of  nausea and recurrent emesis.  She was appropriately shocked, it appears, for VT triggered initially by atrial fibrillation with RVR.  She was started on amiodarone  for both atrial and ventricular arrhythmias.  She converted to normal sinus rhythm and was discharged on amiodarone .    Admitted with ICD shock as well as: Continue complaints of N/V/ constipation, and chest wall tenderness/pain  Assessment & Plan   ICD shocks VT ICD interrogation reviewed: 3 total shocks 2 appropriate 1 looks for AFib w/RVR  AFib and VT rates are very similar > I dont think we can program therapy zone any higher without missing her true VT Continue amiodarone  gtt today  NICM Volume stable Recovered LVEF  Paroxysmal Afib CHA2DS2Vasc is 3, on eliquis  Continue amiodarone  gtt today    Appreciate IM team assistance with her non-cardiac and chronic medical issues:  N/V Constipation Headaches for a couple weeks Chest wall discomfort Reproducible with palpation of her chest wall Hx of gastritis DM Mood disorder Neuropathy L knee pain    For questions or updates, please contact Courtney Davies Please consult [email or web address] for contact info under     { Signed, Courtney Macario Arthur, PA-C  03/02/2024, 11:30 AM    I have seen and examined this patient with Courtney Davies.  Agree with above, note added to reflect my findings.  Patient continues to have multiple noncardiac complaints.  She complains of chest wall pain post shocks as well as nausea, vomiting, constipation, headaches, knee pain.  She has had no further arrhythmias she remains in sinus rhythm.  GEN: No acute distress.   Neck: No JVD Cardiac: RRR, no murmurs, rubs, or gallops.  Respiratory: normal BS bases bilaterally. GI: Soft, nontender, non-distended  MS: No edema; No deformity. Neuro:  Nonfocal  Skin: warm and dry, device site well healed Psych: Normal affect    Ventricular tachycardia: Post ICD shock.  She is now on IV  amiodarone .  She has had no further ICD shocks.  Courtney Davies continue IV amiodarone  for now. Paroxysmal atrial fibrillation: On Eliquis .  Continue IV amiodarone  Nausea vomiting, constipation, headaches, chest wall pain, gastritis, diabetes, mood disorder, neuropathy, knee pain: Per hospitalist team  Courtney Davies M. Courtney Rion MD 03/02/2024 4:58 PM

## 2024-03-03 ENCOUNTER — Ambulatory Visit (HOSPITAL_COMMUNITY): Admitting: Licensed Clinical Social Worker

## 2024-03-03 ENCOUNTER — Telehealth: Payer: Self-pay | Admitting: Cardiology

## 2024-03-03 DIAGNOSIS — Z4502 Encounter for adjustment and management of automatic implantable cardiac defibrillator: Secondary | ICD-10-CM | POA: Diagnosis not present

## 2024-03-03 DIAGNOSIS — I480 Paroxysmal atrial fibrillation: Secondary | ICD-10-CM | POA: Diagnosis not present

## 2024-03-03 LAB — MISC LABCORP TEST (SEND OUT): Labcorp test code: 83935

## 2024-03-03 LAB — GLUCOSE, CAPILLARY
Glucose-Capillary: 103 mg/dL — ABNORMAL HIGH (ref 70–99)
Glucose-Capillary: 98 mg/dL (ref 70–99)

## 2024-03-03 LAB — MAGNESIUM: Magnesium: 1.8 mg/dL (ref 1.7–2.4)

## 2024-03-03 LAB — BASIC METABOLIC PANEL WITH GFR
Anion gap: 8 (ref 5–15)
BUN: 12 mg/dL (ref 6–20)
CO2: 22 mmol/L (ref 22–32)
Calcium: 8.8 mg/dL — ABNORMAL LOW (ref 8.9–10.3)
Chloride: 106 mmol/L (ref 98–111)
Creatinine, Ser: 0.86 mg/dL (ref 0.44–1.00)
GFR, Estimated: >60 mL/min (ref >60)
Glucose, Bld: 93 mg/dL (ref 70–99)
Potassium: 4.3 mmol/L (ref 3.5–5.1)
Sodium: 136 mmol/L (ref 135–145)

## 2024-03-03 LAB — BRAIN NATRIURETIC PEPTIDE: B Natriuretic Peptide: 20.4 pg/mL (ref 0.0–100.0)

## 2024-03-03 MED ORDER — LOSARTAN POTASSIUM 50 MG PO TABS
50.0000 mg | ORAL_TABLET | Freq: Every day | ORAL | Status: DC
  Administered 2024-03-03 – 2024-03-05 (×3): 50 mg via ORAL
  Filled 2024-03-03 (×3): qty 1

## 2024-03-03 MED ORDER — MAGNESIUM SULFATE 2 GM/50ML IV SOLN
2.0000 g | Freq: Once | INTRAVENOUS | Status: AC
  Administered 2024-03-03: 2 g via INTRAVENOUS
  Filled 2024-03-03: qty 50

## 2024-03-03 MED ORDER — PROPRANOLOL HCL 40 MG PO TABS
40.0000 mg | ORAL_TABLET | Freq: Two times a day (BID) | ORAL | Status: DC
  Administered 2024-03-03 – 2024-03-05 (×5): 40 mg via ORAL
  Filled 2024-03-03 (×7): qty 1

## 2024-03-03 MED ORDER — AMIODARONE HCL 200 MG PO TABS
400.0000 mg | ORAL_TABLET | Freq: Every day | ORAL | Status: DC
  Administered 2024-03-03 – 2024-03-05 (×3): 400 mg via ORAL
  Filled 2024-03-03 (×3): qty 2

## 2024-03-03 NOTE — Progress Notes (Signed)
 Mobility Specialist Progress Note:    03/03/24 1001  Mobility  Activity Dangled on edge of bed (Heel/Toe Raises, Leg Ext, Knee Lifts)  Level of Assistance Standby assist, set-up cues, supervision of patient - no hands on  Assistive Device None  Range of Motion/Exercises Left leg;Right leg;Active  Activity Response Tolerated well  Mobility Referral Yes  Mobility visit 1 Mobility  Mobility Specialist Start Time (ACUTE ONLY) 1000  Mobility Specialist Stop Time (ACUTE ONLY) 1025  Mobility Specialist Time Calculation (min) (ACUTE ONLY) 25 min   Received pt agreeable to session. C/o migraine, dizziness, black spot (notified RN). Pt able to sup>sit w/ no assist and able to perform exercises w/ no issues. Left pt on EOB w/ all needs met.   Venetia Keel Mobility Specialist Please Neurosurgeon or Rehab Office at [phone]

## 2024-03-03 NOTE — Telephone Encounter (Signed)
 [facility] [employer] from [place], [place] - patient would like to switch providers from Dr. Inocencio to Dr. Almetta.

## 2024-03-03 NOTE — Progress Notes (Signed)
 Mobility Specialist Progress Note:   03/03/24 1149  Mobility  Activity Ambulated with assistance  Level of Assistance Contact guard assist, steadying assist  Assistive Device Other (Comment) (HHA)  Distance Ambulated (ft) 15 ft  Range of Motion/Exercises Active  Activity Response Tolerated fair;RN notified (Migraine, Dizziness, LOB, Hypertension)  Mobility Referral Yes  Mobility visit 1 Mobility  Mobility Specialist Start Time (ACUTE ONLY) 1149  Mobility Specialist Stop Time (ACUTE ONLY) 1215  Mobility Specialist Time Calculation (min) (ACUTE ONLY) 26 min   Received pt sitting EOB requesting RN to ask MS for a session. Pt c/o symptoms listed above. Pt able to ambulate w/ CGA HHA. Some LOB's. Returned pt to EOB w/ all needs met. RN Notified.   Venetia Keel Mobility Specialist Please Neurosurgeon or Rehab Office at [phone]

## 2024-03-03 NOTE — Progress Notes (Signed)
 Rounding Note   Patient Name: Courtney Davies Date of Encounter: 03/03/2024  [facility] Cardiologist: Candyce Reek, MD   Subjective  no SOB A number of non-cardiac symptoms  Chest wall pain post shocks Headache  Patient requested change in EP MD Dr. Almetta has been bedside,lengthy visit this morning w/patient/sister  Scheduled Meds:  apixaban   5 mg Oral BID   gabapentin   100 mg Oral BID   lamoTRIgine   100 mg Oral BID   linaclotide   145 mcg Oral Daily   losartan   50 mg Oral Daily   pantoprazole   40 mg Oral Daily   propranolol  20 mg Oral QID   QUEtiapine   100 mg Oral QHS   rivastigmine   3 mg Oral BID   rosuvastatin   30 mg Oral Daily   senna-docusate  1 tablet Oral BID   sertraline   100 mg Oral Daily   sucralfate  1 g Oral TID WC & HS   Continuous Infusions:  amiodarone  30 mg/hr (03/03/24 0628)   PRN Meds: acetaminophen , ondansetron  (ZOFRAN ) IV, oxyCODONE -acetaminophen    Vital Signs  Vitals:   03/02/24 1655 03/02/24 1946 03/03/24 0000 03/03/24 0325  BP:  (!) 155/98 (!) 157/83 (!) 142/99  Pulse: 66   68  Resp: 18 20 16 18   Temp: 97.7 F (36.5 C) 97.8 F (36.6 C) 97.8 F (36.6 C) 98.2 F (36.8 C)  TempSrc: Oral Oral Oral Oral  SpO2:  100% 99% 98%  Weight:      Height:        Intake/Output Summary (Last 24 hours) at 03/03/2024 0813 Last data filed at 03/03/2024 9390 Gross per 24 hour  Intake 449.76 ml  Output 1500 ml  Net -1050.24 ml      02/29/2024    5:13 PM 02/23/2024    6:15 PM 02/23/2024    3:15 PM  Last 3 Weights  Weight (lbs) 195 lb 210 lb 15.7 oz 181 lb  Weight (kg) 88.451 kg 95.7 kg 82.101 kg      Telemetry  unchanged SR 60's-70's, no VT, rare PVCs - Personally Reviewed  ECG   No new EKGs - Personally Reviewed  Physical Exam  Seen/examined by Dr. Almetta Unchanged exam GEN: No acute distress.   Neck: No JVD Cardiac: RRR, no murmurs, rubs, or gallops.  Respiratory: Clear to auscultation bilaterally. GI:  Soft, nontender, non-distended  MS: No edema; No deformity. Neuro:  Nonfocal  Psych: Normal affect   Labs High Sensitivity Troponin:   Recent Labs  Lab 02/23/24 1534 02/23/24 1734 02/29/24 1716 02/29/24 2208  TROPONINIHS 7 9 4 6      Chemistry Recent Labs  Lab 02/29/24 1716 03/02/24 0810 03/03/24 0618  NA 138 136 136  K 3.6 4.1 4.3  CL 109 103 106  CO2 18* 23 22  GLUCOSE 112* 84 93  BUN 17 13 12   CREATININE 0.96 0.83 0.86  CALCIUM  9.1 8.9 8.8*  MG 1.6* 1.8 1.8  PROT 7.5  --   --   ALBUMIN 3.8  --   --   AST 23  --   --   ALT 17  --   --   ALKPHOS 96  --   --   BILITOT 0.5  --   --   GFRNONAA >60 >60 >60  ANIONGAP 11 10 8     Lipids No results for input(s): CHOL, TRIG, HDL, LABVLDL, LDLCALC, CHOLHDL in the last 168 hours.  Hematology Recent Labs  Lab 02/29/24 1716  WBC 5.5  RBC 5.72*  HGB 13.0  HCT 41.6  MCV 72.7*  MCH 22.7*  MCHC 31.3  RDW 15.9*  PLT 272   Thyroid   Recent Labs  Lab 02/29/24 1716  TSH 2.067    BNPNo results for input(s): BNP, PROBNP in the last 168 hours.  DDimer No results for input(s): DDIMER in the last 168 hours.   Radiology  DG Abd Portable 1V Result Date: 03/01/2024 EXAM: 1 VIEW XRAY OF THE ABDOMEN 03/01/2024 02:25:00 PM COMPARISON: None available. CLINICAL HISTORY: Nausea and vomiting [id number]; [id number] Constipation [phone]. FINDINGS: BOWEL: Nonobstructive bowel gas pattern. Increased stool consistent with constipation. SOFT TISSUES: No opaque urinary calculi. BONES: No acute osseous abnormality. IMPRESSION: 1. Increased stool consistent with constipation. Electronically signed by: Fonda Field MD 03/01/2024 05:48 PM EST RP Workstation: [id number]     Cardiac Studies  Echocardiogram:      03/05/2024: LVEF 55 to 60%.  Normal structure and function.   Heart Cath:    Imaging: Cardiac MRI March 14, 2021 --normal LV size, mild hypertrophy.  EF 53%.  Normal RV size and function.  No LGE.   Coronary CT  October 2022: Coronary calcium  score of 0  Patient Profile   48 y.o. female w/PMHx of  C. Arrest > w/secondary prevention ICD Anoxic brain injury NICM HTN, DM  NOTED: was discharged on November 12 after hospitalization for ICD shock that occurred after 2 weeks of nausea and recurrent emesis.  She was appropriately shocked, it appears, for VT triggered initially by atrial fibrillation with RVR.  She was started on amiodarone  for both atrial and ventricular arrhythmias.  She converted to normal sinus rhythm and was discharged on amiodarone .    Admitted with ICD shock as well as: Continue complaints of N/V/ constipation, and chest wall tenderness/pain  Assessment & Plan   ICD shocks VT ICD interrogation reviewed: 3 total shocks 2 appropriate 1 looks for AFib w/RVR  TODAY Amio gtt > 400mg  daily Change propranolol to 40mg  BID Increase losartan  to 50mg  AD for BP Replace mag  NICM Volume stable Recovered LVEF  Paroxysmal Afib CHA2DS2Vasc is 3, on eliquis  > PO amiodarone  today Plan for AFib ablation    Appreciate IM team assistance with her non-cardiac and chronic medical issues:  N/V Constipation Headaches for a couple weeks Chest wall discomfort Reproducible with palpation of her chest wall Hx of gastritis DM Mood disorder Neuropathy L knee pain    For questions or updates, please contact [facility] Please consult [email or web address] for contact info under      Signed, Charlies Macario Arthur, PA-C  03/03/2024, 8:13 AM

## 2024-03-03 NOTE — Plan of Care (Signed)

## 2024-03-03 NOTE — Progress Notes (Signed)
 PROGRESS NOTE    Courtney Davies  FMW:[id number] DOB: 23-Mar-1976 DOA: 02/29/2024 PCP: [name] BRAVO, NP  Outpatient Specialists:     Brief Narrative:  Patient is a 48 year old female past medical history significant for hypertension, paroxysmal atrial fibrillation, NICM, cardiac arrest s/p ICD, anoxic brain injury, and diabetes mellitus type group who was admitted following recurrent ICD shocks.  EP team is directing care.  TRH was consulted for nausea.  Nausea has resolved.  Assessment & Plan:   Principal Problem:   ICD (implantable cardioverter-defibrillator) discharge Active Problems:   VT (ventricular tachycardia) (HCC)   PAF (paroxysmal atrial fibrillation) (HCC)   Nausea and vomiting   Constipation   History of gastritis   Controlled type 2 diabetes mellitus without complication, without long-term current use of insulin  (HCC)   History of anoxic brain injury   Cannabis use disorder   Nausea: - Resolved.  Headache: -Migraine-like headache. - Received nasal Imitrex 20 Mg x 1 dose yesterday, headache resolved.  Diabetes mellitus: Recent hemoglobin A1c about a month ago was 5.9.  PTA on metformin which is on hold.  Blood sugars very well-controlled.  She is not needing SSI.  Chronic medical problems: History of anoxic brain injury: Depression: Memory loss: Neuropathy: Marijuana use disorder:  ICD shocks/A-fib/VT: As per primary team.  PS: Hospitalist were called for nausea only.  She is asymptomatic currently.  Hospitalist signing off.  Please let us  know if our services are needed again.  DVT prophylaxis: Apixaban . Code Status: Full code. Family Communication: None present Disposition Plan: Per primary/cardiology   Subjective: Patient seen and examined.  Although she says that she has intermittent nausea but she has not had any vomiting and she is consuming 100% of the meals.  She denied any headache.  Objective: Vitals:   03/02/24 1655 03/02/24 1946  03/03/24 0000 03/03/24 0325  BP:  (!) 155/98 (!) 157/83 (!) 142/99  Pulse: 66   68  Resp: 18 20 16 18   Temp: 97.7 F (36.5 C) 97.8 F (36.6 C) 97.8 F (36.6 C) 98.2 F (36.8 C)  TempSrc: Oral Oral Oral Oral  SpO2:  100% 99% 98%  Weight:      Height:        Intake/Output Summary (Last 24 hours) at 03/03/2024 0734 Last data filed at 03/03/2024 9390 Gross per 24 hour  Intake 449.76 ml  Output 1500 ml  Net -1050.24 ml   Filed Weights   02/29/24 1713  Weight: 88.5 kg    Examination:  General exam: Appears calm and comfortable  Respiratory system: Clear to auscultation. Respiratory effort normal. Cardiovascular system: S1 & S2 heard, RRR. No JVD, murmurs, rubs, gallops or clicks. No pedal edema. Gastrointestinal system: Abdomen is nondistended, soft and nontender. No organomegaly or masses felt. Normal bowel sounds heard. Central nervous system: Alert and oriented. No focal neurological deficits. Extremities: Symmetric 5 x 5 power. Skin: No rashes, lesions or ulcers.    Data Reviewed: I have personally reviewed following labs and imaging studies  CBC: Recent Labs  Lab 02/29/24 1716  WBC 5.5  NEUTROABS 2.6  HGB 13.0  HCT 41.6  MCV 72.7*  PLT 272   Basic Metabolic Panel: Recent Labs  Lab 02/29/24 1716 03/02/24 0810 03/03/24 0618  NA 138 136 136  K 3.6 4.1 4.3  CL 109 103 106  CO2 18* 23 22  GLUCOSE 112* 84 93  BUN 17 13 12   CREATININE 0.96 0.83 0.86  CALCIUM  9.1 8.9 8.8*  MG 1.6*  1.8 1.8   GFR: Estimated Creatinine Clearance: 90.6 mL/min (by C-G formula based on SCr of 0.86 mg/dL). Liver Function Tests: Recent Labs  Lab 02/29/24 1716  AST 23  ALT 17  ALKPHOS 96  BILITOT 0.5  PROT 7.5  ALBUMIN 3.8   Recent Labs  Lab 02/29/24 2208  LIPASE 29   No results for input(s): AMMONIA in the last 168 hours. Coagulation Profile: No results for input(s): INR, PROTIME in the last 168 hours. Cardiac Enzymes: No results for input(s): CKTOTAL,  CKMB, CKMBINDEX, TROPONINI in the last 168 hours. BNP (last 3 results) No results for input(s): PROBNP in the last 8760 hours. HbA1C: No results for input(s): HGBA1C in the last 72 hours. CBG: No results for input(s): GLUCAP in the last 168 hours. Lipid Profile: No results for input(s): CHOL, HDL, LDLCALC, TRIG, CHOLHDL, LDLDIRECT in the last 72 hours. Thyroid  Function Tests: Recent Labs    02/29/24 1716  TSH 2.067   Anemia Panel: No results for input(s): VITAMINB12, FOLATE, FERRITIN, TIBC, IRON, RETICCTPCT in the last 72 hours. Urine analysis:    Component Value Date/Time   COLORURINE YELLOW 09/24/2023 1605   APPEARANCEUR CLEAR 09/24/2023 1605   LABSPEC 1.020 09/24/2023 1605   PHURINE 7.0 09/24/2023 1605   GLUCOSEU >=500 (A) 09/24/2023 1605   HGBUR NEGATIVE 09/24/2023 1605   BILIRUBINUR NEGATIVE 09/24/2023 1605   BILIRUBINUR postive 07/12/2023 1623   KETONESUR NEGATIVE 09/24/2023 1605   PROTEINUR NEGATIVE 09/24/2023 1605   UROBILINOGEN 0.2 07/12/2023 1623   UROBILINOGEN 1.0 02/21/2014 1544   NITRITE NEGATIVE 09/24/2023 1605   LEUKOCYTESUR NEGATIVE 09/24/2023 1605   Sepsis Labs: @LABRCNTIP (procalcitonin:4,lacticidven:4)  ) Recent Results (from the past 240 hours)  MRSA Next Gen by PCR, Nasal     Status: None   Collection Time: 02/23/24  6:19 PM   Specimen: Nasal Mucosa; Nasal Swab  Result Value Ref Range Status   MRSA by PCR Next Gen NOT DETECTED NOT DETECTED Final    Comment: (NOTE) The GeneXpert MRSA Assay (FDA approved for NASAL specimens only), is one component of a comprehensive MRSA colonization surveillance program. It is not intended to diagnose MRSA infection nor to guide or monitor treatment for MRSA infections. Test performance is not FDA approved in patients less than 84 years old. Performed at [facility], [address], [place], [place] [address]          Radiology Studies: DG Abd Portable  1V Result Date: 03/01/2024 EXAM: 1 VIEW XRAY OF THE ABDOMEN 03/01/2024 02:25:00 PM COMPARISON: None available. CLINICAL HISTORY: Nausea and vomiting [id number]; [id number] Constipation [phone]. FINDINGS: BOWEL: Nonobstructive bowel gas pattern. Increased stool consistent with constipation. SOFT TISSUES: No opaque urinary calculi. BONES: No acute osseous abnormality. IMPRESSION: 1. Increased stool consistent with constipation. Electronically signed by: Fonda Field MD 03/01/2024 05:48 PM EST RP Workstation: [id number]        Scheduled Meds:  apixaban   5 mg Oral BID   gabapentin   100 mg Oral BID   lamoTRIgine   100 mg Oral BID   linaclotide   145 mcg Oral Daily   losartan   50 mg Oral Daily   pantoprazole   40 mg Oral Daily   propranolol  20 mg Oral QID   QUEtiapine   100 mg Oral QHS   rivastigmine   3 mg Oral BID   rosuvastatin   30 mg Oral Daily   senna-docusate  1 tablet Oral BID   sertraline   100 mg Oral Daily   sucralfate  1 g Oral TID WC &  HS   Continuous Infusions:  amiodarone  30 mg/hr (03/03/24 0628)     LOS: 3 days    Time spent: 35 minutes    Leatrice Chapel, MD  [facility] Pager #: [phone] 7PM-7AM contact night coverage as above

## 2024-03-04 DIAGNOSIS — Z4502 Encounter for adjustment and management of automatic implantable cardiac defibrillator: Secondary | ICD-10-CM | POA: Diagnosis not present

## 2024-03-04 DIAGNOSIS — I480 Paroxysmal atrial fibrillation: Secondary | ICD-10-CM | POA: Diagnosis not present

## 2024-03-04 LAB — BASIC METABOLIC PANEL WITH GFR
Anion gap: 11 (ref 5–15)
BUN: 19 mg/dL (ref 6–20)
CO2: 21 mmol/L — ABNORMAL LOW (ref 22–32)
Calcium: 9.5 mg/dL (ref 8.9–10.3)
Chloride: 104 mmol/L (ref 98–111)
Creatinine, Ser: 0.93 mg/dL (ref 0.44–1.00)
GFR, Estimated: >60 mL/min (ref >60)
Glucose, Bld: 88 mg/dL (ref 70–99)
Potassium: 4 mmol/L (ref 3.5–5.1)
Sodium: 136 mmol/L (ref 135–145)

## 2024-03-04 LAB — GLUCOSE, CAPILLARY
Glucose-Capillary: 79 mg/dL (ref 70–99)
Glucose-Capillary: 124 mg/dL — ABNORMAL HIGH (ref 70–99)
Glucose-Capillary: 93 mg/dL (ref 70–99)

## 2024-03-04 LAB — MAGNESIUM: Magnesium: 1.9 mg/dL (ref 1.7–2.4)

## 2024-03-04 NOTE — H&P (View-Only) (Signed)
 Patient Name: CARLE DARGAN Date of Encounter: 03/04/2024  Primary Cardiologist: Candyce Reek, MD Electrophysiologist: Will Gladis Norton, MD  Interval Summary   Patient frustrated this morning, reports headache and dizziness. She also describes not feeling well enough to ambulate, though appears to have done well with mobility specialist yesterday. Reviewed tentative plans for afib ablation but patient denies recollection of this conversation between her, her sister, and Dr. Almetta.   Vital Signs    Vitals:   03/03/24 1450 03/03/24 2032 03/03/24 2313 03/04/24 0755  BP: 124/76 (!) 145/79 (!) 140/76 119/75  Pulse: 60   64  Resp: 17 16 18 17   Temp: 97.7 F (36.5 C) 97.9 F (36.6 C) 98.2 F (36.8 C) 98.2 F (36.8 C)  TempSrc: Oral Oral Oral Oral  SpO2: 100% 100% 98% 100%  Weight:      Height:        Intake/Output Summary (Last 24 hours) at 03/04/2024 9071 Last data filed at 03/03/2024 1000 Gross per 24 hour  Intake --  Output 500 ml  Net -500 ml   Filed Weights   02/29/24 1713  Weight: 88.5 kg    Physical Exam    GEN- NAD, Alert and oriented  Lungs- Clear to ausculation bilaterally, normal work of breathing Cardiac- Regular rate and rhythm, no murmurs, rubs or gallops GI- soft, NT, ND, + BS Extremities- no clubbing or cyanosis. No edema  Telemetry    Sinus rhythm with no afib or ventricular ectopy (personally reviewed)  Hospital Course    [name] is a 48 y.o. female admitted for recurrent ICD shocks. Previously discharged on 11/12 after hospitalization for ICD shocks in the setting of nausea and recurrent emesis. Shocks at that time appeared appropriate for VT triggered by afib with RVR. Patient was started on Amiodarone  and discharged. Repeat interrogation this admission show 3 total shocks since discharge on 11/12: 2 appropriate for VT and 1 that appears to have been inappropriate for afib with RVR. Started on IV amiodarone  upon this  admission.   Assessment & Plan    VT s/p ICD therapy Paroxysmal Afib with RVR Secondary hypercoagulable state Patient transitioned to oral Amiodarone  yesterday, 400mg  daily. Telemetry this admission without recurrent arrhythmia. Per Dr. Almetta, given breakthrough afib despite appropriate Amiodarone  load, could consider ablation. Conversation about this held between patient, sister, and Dr. Almetta yesterday, patient unable to recall this today. Expresses frustration that this would not be completed this admission. Continue Amiodarone  400mg  daily. Although recent notes (including those from outside health system) indicate active Rx for Amiodarone , fill history does not appear consistent since 2024. Will ensure patient leaves with this upon discharge. Continue Propranolol  40mg  BID Continue Eliquis  5mg  BID. Patient today reports recent compliance but says that she went ~one month without it recently. Rx hx shows gap in fills from [place].  Will begin process to arrange expedited outpatient ablation, though this may be complicated by recent Eliquis  non-adherence.   Patient appears clinically stable for d/c but continues to express concerns regarding this, has ongoing non-cardiac complaints. Will follow up with Dr. Almetta re disposition after his first case.   HFrecEF NICM Patient remains euvolemic. Continue Losartan , Propranolol   Headache Resolved yesterday with Imitrex  20mg  but patient reports recurrent discomfort today. Will discuss repeat Imitrex  dose with Dr. Almetta vs re-engaging hospital med team.  DM type II On Metformin  PTA. Glucose relatively well controlled this admission. Plan to resume Metformin  at d/c.   Nausea Hx Gastritis Patient with several weeks  of nausea prior to this admission. Hospital medicine consulted to assist with management, started Carafate  and bowel regimen. Per their recs, will also continue Protonix  at d/c. Fortunately, nausea has not recurred since  day of admission.   Mood disorder Major depressive disorder Continue PTA meds including Zoloft , Lamictal , Seroquel .  Memory loss/anoxic brain injury Continue Rivastigmine .     For questions or updates, please contact [facility] Please consult [email or web address] for contact info under     Signed, Artist Pouch, PA-C  03/04/2024, 9:28 AM

## 2024-03-04 NOTE — Progress Notes (Addendum)
 Patient Name: Courtney Davies Date of Encounter: 03/04/2024  Primary Cardiologist: Candyce Reek, MD Electrophysiologist: Will Gladis Norton, MD  Interval Summary   Patient frustrated this morning, reports headache and dizziness. She also describes not feeling well enough to ambulate, though appears to have done well with mobility specialist yesterday. Reviewed tentative plans for afib ablation but patient denies recollection of this conversation between her, her sister, and Dr. Almetta.   Vital Signs    Vitals:   03/03/24 1450 03/03/24 2032 03/03/24 2313 03/04/24 0755  BP: 124/76 (!) 145/79 (!) 140/76 119/75  Pulse: 60   64  Resp: 17 16 18 17   Temp: 97.7 F (36.5 C) 97.9 F (36.6 C) 98.2 F (36.8 C) 98.2 F (36.8 C)  TempSrc: Oral Oral Oral Oral  SpO2: 100% 100% 98% 100%  Weight:      Height:        Intake/Output Summary (Last 24 hours) at 03/04/2024 9071 Last data filed at 03/03/2024 1000 Gross per 24 hour  Intake --  Output 500 ml  Net -500 ml   Filed Weights   02/29/24 1713  Weight: 88.5 kg    Physical Exam    GEN- NAD, Alert and oriented  Lungs- Clear to ausculation bilaterally, normal work of breathing Cardiac- Regular rate and rhythm, no murmurs, rubs or gallops GI- soft, NT, ND, + BS Extremities- no clubbing or cyanosis. No edema  Telemetry    Sinus rhythm with no afib or ventricular ectopy (personally reviewed)  Hospital Course    Courtney Davies is a 48 y.o. female admitted for recurrent ICD shocks. Previously discharged on 11/12 after hospitalization for ICD shocks in the setting of nausea and recurrent emesis. Shocks at that time appeared appropriate for VT triggered by afib with RVR. Patient was started on Amiodarone  and discharged. Repeat interrogation this admission show 3 total shocks since discharge on 11/12: 2 appropriate for VT and 1 that appears to have been inappropriate for afib with RVR. Started on IV amiodarone  upon this  admission.   Assessment & Plan    VT s/p ICD therapy Paroxysmal Afib with RVR Secondary hypercoagulable state Patient transitioned to oral Amiodarone  yesterday, 400mg  daily. Telemetry this admission without recurrent arrhythmia. Per Dr. Almetta, given breakthrough afib despite appropriate Amiodarone  load, could consider ablation. Conversation about this held between patient, sister, and Dr. Almetta yesterday, patient unable to recall this today. Expresses frustration that this would not be completed this admission. Continue Amiodarone  400mg  daily. Although recent notes (including those from outside health system) indicate active Rx for Amiodarone , fill history does not appear consistent since 2024. Will ensure patient leaves with this upon discharge. Continue Propranolol  40mg  BID Continue Eliquis  5mg  BID. Patient today reports recent compliance but says that she went ~one month without it recently. Rx hx shows gap in fills from [place].  Will begin process to arrange expedited outpatient ablation, though this may be complicated by recent Eliquis  non-adherence.   Patient appears clinically stable for d/c but continues to express concerns regarding this, has ongoing non-cardiac complaints. Will follow up with Dr. Almetta re disposition after his first case.   HFrecEF NICM Patient remains euvolemic. Continue Losartan , Propranolol   Headache Resolved yesterday with Imitrex  20mg  but patient reports recurrent discomfort today. Will discuss repeat Imitrex  dose with Dr. Almetta vs re-engaging hospital med team.  DM type II On Metformin  PTA. Glucose relatively well controlled this admission. Plan to resume Metformin  at d/c.   Nausea Hx Gastritis Patient with several weeks  of nausea prior to this admission. Hospital medicine consulted to assist with management, started Carafate  and bowel regimen. Per their recs, will also continue Protonix  at d/c. Fortunately, nausea has not recurred since  day of admission.   Mood disorder Major depressive disorder Continue PTA meds including Zoloft , Lamictal , Seroquel .  Memory loss/anoxic brain injury Continue Rivastigmine .     For questions or updates, please contact [facility] Please consult [email or web address] for contact info under     Signed, Artist Pouch, PA-C  03/04/2024, 9:28 AM

## 2024-03-04 NOTE — Discharge Summary (Signed)
 ELECTROPHYSIOLOGY DISCHARGE SUMMARY    Patient ID: Courtney Davies,  MRN: [id number], DOB/AGE: [birth year] 48 y.o.  Admit date: 02/29/2024 Discharge date: 03/05/2024  Primary Care Physician: [name] BRAVO, NP  Primary Cardiologist: Candyce Reek, MD  Electrophysiologist: Dr. Almetta   Primary Discharge Diagnosis:  Ventricular tachycardia  Secondary Discharge Diagnosis:  Paroxysmal atrial fibrillation with RVR HRrecEF  Procedures This Admission:  N/A      Brief HPI: Courtney Davies is a 48 y.o. female admitted for recurrent ICD shocks. Previously discharged on 11/12 after hospitalization for ICD shocks in the setting of nausea and recurrent emesis. Shocks at that time appeared appropriate for VT triggered by afib with RVR. Patient was started on Amiodarone  and discharged. Repeat interrogation this admission show 3 total shocks since discharge on 11/12: 2 appropriate for VT and 1 that appears to have been inappropriate for afib with RVR. Started on IV amiodarone  upon this admission.   Hospital Course:  The patient was admitted 02/29/24 with recurrent ICD shocks. Repeat interrogation this admission shows 3 total shocks since discharge on 11/12: 2 appropriate for VT and 1 that appears to have been inappropriate for afib with RVR. She was restarted on IV amiodarone . Though plans per prior discharge summary were to continue oral Amiodarone , not clear that patient was given Rx. Upon resuming IV Amiodarone  this admission, no recurrent afib or VT. Her Coreg  was switched to Propranolol. Telemetry remains quiet with NSR on oral Amiodarone  and BID Propranolol. Patient to discharge on Amiodarone  400mg  daily and will continue this dose through planned afib ablation. Will also continue Propranolol 40mg  BID in place of PTA Coreg . Emphasized compliance with twice daily Eliquis  5mg  given stroke risk as well as planned ablation. Her afib ablation will tentatively be scheduled for next Monday.  Arrival instructions given to patient with discharge paperwork today.   During this admission, patient also seen by general medicine team for recurrent nausea that has since resolved. Will continue PPI at d/c per medicine team recommendation. Patient with well controlled glucose this admission off Metformin. Given potential contribution to her recent GI upset, will have her hold indefinitely at discharge. She can discuss resuming with her PCP. Encouraged low carb diet with this change. Patient also with headache that was responsive to Imitrex. We have recommended close follow up with her PCP for this as well.   Patient will continue PTA meds including Zoloft , Lamictal , Seroquel , Rivastigmine . Her concerns about continuing to take these medications should be addressed by her PCP.    The patient was examined and considered to be stable for discharge. All medication changes were reviewed with patient at bedside. Dr. Almetta also communicated with patient's sister via telephone regarding medication and ablation plans.   Physical Exam: Vitals:   03/05/24 0000 03/05/24 0423 03/05/24 0854 03/05/24 1140  BP: 95/62 113/73 124/72 (!) 143/90  Pulse: 68 60 70 66  Resp: 20 18  20   Temp: 97.8 F (36.6 C) 98 F (36.7 C) 97.9 F (36.6 C) 98.3 F (36.8 C)  TempSrc: Oral Oral Oral Oral  SpO2: 100% 100% 100% 100%  Weight:      Height:        GEN- NAD. A&O x 3.  HEENT: Normocephalic, atraumatic Lungs- CTAB, Normal effort.  Heart- RRR, No M/G/R.  GI- Soft, NT, ND.  Extremities- No clubbing, cyanosis, or edema; Groin without hematoma   Discharge Medications:  Allergies as of 03/05/2024       Reactions   Latex  Hives   Other Other (See Comments), Anaphylaxis, Swelling, Hives   Hair glue causes throat to close and hives glue Hair glue causes throat to close   Codeine  Nausea And Vomiting   Was on an empty stomach.   Nitrofurantoin Rash        Medication List     PAUSE taking these  medications    Ozempic (2 MG/DOSE) 8 MG/3ML Sopn Wait to take this until: March 16, 2024 Generic drug: Semaglutide (2 MG/DOSE) Inject 2 mg into the skin once a week.       STOP taking these medications    carvedilol  6.25 MG tablet Commonly known as: COREG    metformin 500 MG (OSM) 24 hr tablet Commonly known as: FORTAMET       TAKE these medications    Accu-Chek Guide Me w/Device Kit 1 kit by Does not apply route as directed. Check blood sugars once daily Dx code E11.65   Accu-Chek Softclix Lancets lancets Check blood sugars once daily Dx code E10.9   acetaminophen  325 MG tablet Commonly known as: TYLENOL  Take 2 tablets (650 mg total) by mouth every 6 (six) hours as needed for headache or mild pain (pain score 1-3).   amiodarone  200 MG tablet Commonly known as: PACERONE  Take 2 tablets (400 mg total) by mouth daily.   apixaban  5 MG Tabs tablet Commonly known as: Eliquis  Take 1 tablet (5 mg total) by mouth 2 (two) times daily.   BD Pen Needle Nano 2nd Gen 32G X 4 MM Misc Generic drug: Insulin  Pen Needle 3 (three) times daily. as directed   cetirizine  10 MG tablet Commonly known as: ZYRTEC  Take 1 tablet (10 mg total) by mouth daily.   docusate sodium  100 MG capsule Commonly known as: Colace Take 1 capsule (100 mg total) by mouth daily as needed.   doxycycline  100 MG tablet Commonly known as: VIBRA -TABS Take 1 tablet (100 mg total) by mouth 2 (two) times daily. To be taken after surgery   gabapentin  100 MG capsule Commonly known as: NEURONTIN  Take 100 mg by mouth 2 (two) times daily.   hydrOXYzine  10 MG tablet Commonly known as: ATARAX  TAKE 1 TABLET(10 MG) BY MOUTH AT BEDTIME   lamoTRIgine  100 MG tablet Commonly known as: LAMICTAL  TAKE 1 TABLET BY MOUTH TWICE DAILY   Linzess  145 MCG Caps capsule Generic drug: linaclotide  Take 145 mcg by mouth daily as needed (constipation).   losartan  50 MG tablet Commonly known as: COZAAR  Take 1 tablet (50 mg  total) by mouth daily. What changed:  medication strength how much to take   methocarbamol  750 MG tablet Commonly known as: ROBAXIN  Take 1 tablet (750 mg total) by mouth 3 (three) times daily as needed. What changed: when to take this   omeprazole 40 MG capsule Commonly known as: PRILOSEC Take 40 mg by mouth in the morning and at bedtime.   ondansetron  4 MG tablet Commonly known as: Zofran  Take 1 tablet (4 mg total) by mouth every 8 (eight) hours as needed for nausea or vomiting.   propranolol 40 MG tablet Commonly known as: INDERAL Take 1 tablet (40 mg total) by mouth 2 (two) times daily.   QUEtiapine  100 MG tablet Commonly known as: SEROQUEL  Take 100 mg by mouth at bedtime.   rivastigmine  3 MG capsule Commonly known as: EXELON  TAKE 1 CAPSULE(3 MG) BY MOUTH TWICE DAILY   rosuvastatin  20 MG tablet Commonly known as: Crestor  Take 1.5 tablets (30 mg total) by mouth daily.  sertraline  100 MG tablet Commonly known as: ZOLOFT  Take 1 tablet (100 mg total) by mouth daily.        Disposition: Home with usual follow up as in AVS  Duration of Discharge Encounter:  APP time: 25 minutes  Signed, Artist Pouch, PA-C  03/05/2024 1:34 PM

## 2024-03-04 NOTE — Plan of Care (Signed)
  Problem: Education: Goal: Knowledge of General Education information will improve Description: Including pain rating scale, medication(s)/side effects and non-pharmacologic comfort measures Outcome: Progressing   Problem: Activity: Goal: Risk for activity intolerance will decrease Outcome: Progressing   Problem: Safety: Goal: Ability to remain free from injury will improve Outcome: Progressing   Problem: Skin Integrity: Goal: Risk for impaired skin integrity will decrease Outcome: Progressing   Problem: Activity: Goal: Ability to tolerate increased activity will improve Outcome: Progressing
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
## 2024-03-05 ENCOUNTER — Other Ambulatory Visit: Payer: Self-pay

## 2024-03-05 ENCOUNTER — Telehealth: Payer: Self-pay

## 2024-03-05 ENCOUNTER — Ambulatory Visit (INDEPENDENT_AMBULATORY_CARE_PROVIDER_SITE_OTHER): Admitting: Orthopedic Surgery

## 2024-03-05 ENCOUNTER — Encounter: Payer: Self-pay | Admitting: Orthopedic Surgery

## 2024-03-05 ENCOUNTER — Inpatient Hospital Stay: Payer: Self-pay | Admitting: Orthopedic Surgery

## 2024-03-05 ENCOUNTER — Other Ambulatory Visit (HOSPITAL_COMMUNITY): Payer: Self-pay

## 2024-03-05 VITALS — BP 118/64 | HR 63 | Temp 98.0°F | Ht 66.0 in | Wt 192.2 lb

## 2024-03-05 DIAGNOSIS — R112 Nausea with vomiting, unspecified: Secondary | ICD-10-CM | POA: Diagnosis not present

## 2024-03-05 DIAGNOSIS — K5901 Slow transit constipation: Secondary | ICD-10-CM

## 2024-03-05 DIAGNOSIS — I480 Paroxysmal atrial fibrillation: Secondary | ICD-10-CM

## 2024-03-05 DIAGNOSIS — E1159 Type 2 diabetes mellitus with other circulatory complications: Secondary | ICD-10-CM

## 2024-03-05 DIAGNOSIS — Z95810 Presence of automatic (implantable) cardiac defibrillator: Secondary | ICD-10-CM

## 2024-03-05 DIAGNOSIS — Z7985 Long-term (current) use of injectable non-insulin antidiabetic drugs: Secondary | ICD-10-CM

## 2024-03-05 DIAGNOSIS — D649 Anemia, unspecified: Secondary | ICD-10-CM | POA: Diagnosis not present

## 2024-03-05 DIAGNOSIS — Z7189 Other specified counseling: Secondary | ICD-10-CM

## 2024-03-05 DIAGNOSIS — G8929 Other chronic pain: Secondary | ICD-10-CM

## 2024-03-05 DIAGNOSIS — I428 Other cardiomyopathies: Secondary | ICD-10-CM | POA: Diagnosis not present

## 2024-03-05 DIAGNOSIS — J42 Unspecified chronic bronchitis: Secondary | ICD-10-CM

## 2024-03-05 DIAGNOSIS — I110 Hypertensive heart disease with heart failure: Secondary | ICD-10-CM

## 2024-03-05 DIAGNOSIS — I5020 Unspecified systolic (congestive) heart failure: Secondary | ICD-10-CM

## 2024-03-05 DIAGNOSIS — K219 Gastro-esophageal reflux disease without esophagitis: Secondary | ICD-10-CM

## 2024-03-05 DIAGNOSIS — Z48812 Encounter for surgical aftercare following surgery on the circulatory system: Secondary | ICD-10-CM | POA: Diagnosis not present

## 2024-03-05 DIAGNOSIS — E119 Type 2 diabetes mellitus without complications: Secondary | ICD-10-CM | POA: Diagnosis not present

## 2024-03-05 LAB — GLUCOSE, CAPILLARY
Glucose-Capillary: 77 mg/dL (ref 70–99)
Glucose-Capillary: 84 mg/dL (ref 70–99)
Glucose-Capillary: 91 mg/dL (ref 70–99)

## 2024-03-05 LAB — BASIC METABOLIC PANEL WITH GFR
Anion gap: 8 (ref 5–15)
BUN: 18 mg/dL (ref 6–20)
CO2: 25 mmol/L (ref 22–32)
Calcium: 9 mg/dL (ref 8.9–10.3)
Chloride: 104 mmol/L (ref 98–111)
Creatinine, Ser: 1.16 mg/dL — ABNORMAL HIGH (ref 0.44–1.00)
GFR, Estimated: 59 mL/min — ABNORMAL LOW (ref >60)
Glucose, Bld: 93 mg/dL (ref 70–99)
Potassium: 4 mmol/L (ref 3.5–5.1)
Sodium: 137 mmol/L (ref 135–145)

## 2024-03-05 MED ORDER — AMIODARONE HCL 200 MG PO TABS
400.0000 mg | ORAL_TABLET | Freq: Every day | ORAL | 1 refills | Status: DC
  Filled 2024-03-05 – 2024-03-30 (×2): qty 60, 30d supply, fill #0

## 2024-03-05 MED ORDER — LOSARTAN POTASSIUM 50 MG PO TABS
50.0000 mg | ORAL_TABLET | Freq: Every day | ORAL | 1 refills | Status: DC
  Filled 2024-03-05 – 2024-03-30 (×2): qty 30, 30d supply, fill #0

## 2024-03-05 MED ORDER — PROPRANOLOL HCL 40 MG PO TABS
40.0000 mg | ORAL_TABLET | Freq: Two times a day (BID) | ORAL | 1 refills | Status: DC
  Filled 2024-03-05 – 2024-03-30 (×2): qty 60, 30d supply, fill #0

## 2024-03-05 NOTE — Plan of Care (Signed)
  Problem: Education: Goal: Knowledge of General Education information will improve Description: Including pain rating scale, medication(s)/side effects and non-pharmacologic comfort measures Outcome: Progressing   Problem: Pain Managment: Goal: General experience of comfort will improve and/or be controlled Outcome: Progressing   Problem: Safety: Goal: Ability to remain free from injury will improve Outcome: Progressing   Problem: Skin Integrity: Goal: Risk for impaired skin integrity will decrease Outcome: Progressing   Problem: Activity: Goal: Ability to tolerate increased activity will improve Outcome: Progressing

## 2024-03-05 NOTE — Telephone Encounter (Signed)
 Attempted phone call to pt and left voicemail message (OK per EPIC) and advised arrival time for afib ablation has been changed to 730am.  Pt may call [phone] for any further questions.

## 2024-03-05 NOTE — TOC CM/SW Note (Addendum)
 Transition of Care [facility]) - Inpatient Brief Assessment   Patient Details  Name: Courtney Davies MRN: [id number] Date of Birth: 1975-06-11  Transition of Care [facility]) CM/SW Contact:    [name], RN Phone Number: 03/05/2024, 10:02 AM   Clinical Narrative: From home with children, has PCP and insurance on file, states has [facility] services in place  but not sure of the name,  she has an aide for 2hrs also.  NCM looked up and it is [facility], patient states she would like to continue with them they have only been out one time  since last admit.  NCM confirmed with Arna with [facility], will need HH orders.  Has shower chair and bsc at home.  States family member  (mom) will transport them home at [employer] and family is support system, states gets medications from [facility] in [place] on [address].  Pta self ambulatory.  [facility] states she is active with HHPT, HHOT will need orders.   Transition of Care Asessment: Insurance and Status: Insurance coverage has been reviewed Patient has primary care physician: Yes Home environment has been reviewed: home with children Prior level of function:: indep Prior/Current Home Services: Current home services (bsc, shower chair) Social Drivers of Health Review: SDOH reviewed no interventions necessary Readmission risk has been reviewed: Yes Transition of care needs: transition of care needs identified, TOC will continue to follow

## 2024-03-05 NOTE — TOC Transition Note (Signed)
 Transition of Care [facility]) - Discharge Note   Patient Details  Name: Courtney Davies MRN: [id number] Date of Birth: April 11, 1976  [facility]) CM/SW Contact:  [name], RN Phone Number: 03/05/2024, 11:43 AM   Clinical Narrative:    For dc today, transport is her mom who is at the bedside.     Final next level of care: Home w Home Health Services Barriers to Discharge: Continued Medical Work up   Patient Goals and CMS Choice Patient states their goals for this hospitalization and ongoing recovery are:: return home CMS Medicare.gov Compare Post Acute Care list provided to:: Patient Choice offered to / list presented to : Patient      Discharge Placement                       Discharge Plan and Services Additional resources added to the After Visit Summary for     Discharge Planning Services: CM Consult Post Acute Care Choice: Home Health          DME Arranged: N/A DME Agency: NA       HH Arranged: PT, OT HH Agency: [facility] Date HH Agency Contacted: 03/05/24 Time HH Agency Contacted: 1009 Representative spoke with at [facility] Agency: Arna  Social Drivers of Health (SDOH) Interventions SDOH Screenings   Food Insecurity: Food Insecurity Present (02/29/2024)  Housing: Low Risk  (02/29/2024)  Transportation Needs: No Transportation Needs (02/29/2024)  Utilities: At Risk (02/29/2024)  Depression (PHQ2-9): Low Risk  (01/08/2024)  Recent Concern: Depression (PHQ2-9) - High Risk (12/19/2023)  Financial Resource Strain: High Risk (08/02/2023)  Social Connections: Unknown (07/04/2023)  Stress: Stress Concern Present (05/15/2023)  Tobacco Use: High Risk (02/24/2024)     Readmission Risk Interventions    03/05/2024   10:00 AM 02/26/2024   11:41 AM  Readmission Risk Prevention Plan  Transportation Screening Complete Complete  PCP or Specialist Appt within 5-7 Days  Complete  Home Care Screening  Complete  Medication Review (RN Care  Manager) Complete   PCP or Specialist appointment within 3-5 days of discharge Complete   HRI or Home Care Consult Complete   Palliative Care Screening Not Applicable   Skilled Nursing Facility Not Applicable

## 2024-03-05 NOTE — Discharge Instructions (Signed)
 Electrophysiology/Ablation Procedure Instructions    Courtney Davies   You are scheduled for a Atrial Fibrillation Ablation on Monday, November 24 with Dr. Almetta.    [facility] - Main Entrance A   [address]  [place], [place] [address]    Arrival Time: 5:30 AM    Special note: Every effort is made to have your procedure done on time. Please understand that emergencies sometimes delay scheduled procedures.    _________________________________________________________________   Before the Procedure:     1) Labs: Labs have been completed   2) Please notify your provider if you are started on any new medications prior to your procedure.   3) If you take a blood thinner do not miss any doses prior to the day of your procedure.    4) For your safety, and to allow us  to monitor your vital signs accurately during the surgery/procedure we request that if you have artificial nails, gel coating, SNS etc. Please have those removed prior to your surgery/procedure.    5) {   __________________________________________________________________   Day of Procedure:     1) NO eating or drinking after midnight (NPO after 12:00am).      2) On the morning of your procedure Do NOT take any medication.       3) Free valet parking service is available. You will check in at ADMITTING. The support person will be asked to wait in the waiting room.  It is OK to have someone drop you off and come back when you are ready to be discharged.    4) Plan to go home the same day, you will only stay overnight if medically necessary.    5) You MUST have a responsible adult to drive you home.    6) An adult MUST be with you the first 24 hours after you arrive home.    7) Bring a current list of your medications, and the last time and date medication taken.    8) Bring ID and current insurance cards.    9) Please wear clothes that are easy to get on and off and wear slip-on shoes.      ____________________________________________________________________   After Procedure:   1) It is recommended that you do not drive for 4 days, lift over 5 lbs for 7 days and no work or strenuous activity for 7 days after your procedure.    2) Follow-up: To be scheduled      * If you have ANY questions, please call the office [phone] or send a MyChart message.

## 2024-03-05 NOTE — Progress Notes (Signed)
 Careteam: Patient Care Team: [name] BRAVO, NP as PCP - General (Adult Health Nurse Practitioner) [name], MD as PCP - Cardiology (Cardiology) [name], MD as PCP - Electrophysiology (Cardiology) Boneta, [name], PA-C (Family Medicine) Gordy Channing LABOR, RN as Registered Nurse  Seen by: [name], AGNP-C  PLACE OF SERVICE:  [facility]  Advanced Directive information    Allergies  Allergen Reactions   Latex Hives   Other Other (See Comments), Anaphylaxis, Swelling and Hives    Hair glue causes throat to close and hives glue Hair glue causes throat to close    Codeine  Nausea And Vomiting    Was on an empty stomach.   Nitrofurantoin Rash    Chief Complaint  Patient presents with   Hospitalization Follow-up    Hospital Follow up Defib alert-discharged      HPI: Patient is a 48 y.o. female seen today for acute visit due to diabetes management and low A1c.   Discussed the use of AI scribe software for clinical note transcription with the patient, who gave verbal consent to proceed.  History of Present Illness   Courtney Davies is a 48 year old female with prediabetes who presents for a follow-up after recent hospitalization. She is accompanied by her mother.  She was discharged from the hospital earlier this afternoon and advised to follow up on diabetes management. She was following [facility] endocrinology in the past but reports not being seen in months. She was taking Ozempic and metformin in the past for glucose control. Her recent A1c was noted to be 5.9, indicating prediabetes. Blood sugar levels in the hospital this morning were 91 and 84. Reports some low blood sugars last hospitalization which improved with orange juice. She reports that she has stopped taking Ozempic due to side effects but cannot tell when she stopped medication.   Hospitalized 11/15-11/20 due to recurrent ICD shocks. She was recently discharged 11/12 from  hospital due to ICD shocks associated with nausea and emesis. Shocks then triggered VT and atrial fib with RVR. She improved with amiodarone . Coreg  was switched to propranolol and losartan  was increased. She is scheduled for ablation 11/24.   She experiences nausea and vomiting after eating, which she attributed to Ozempic in the past, although she has stopped taking it. She reports throwing up after meals and has a lack of appetite and difficulty with bowel movements, for which she takes Linzess  and occasionally uses a stool softener. She has been advised to continue PPI. She was also discharged with zofran .    She mentions having had a seizure in the past related to a cardiac arrest and is on lamotrigine  for seizure prevention. She also takes rosuvastatin  for cholesterol management.  She reports issues with bowel incontinence, describing episodes of soiling without awareness. She is scheduled to see a gastroenterologist in January.     Patient brought medications today and medication list was updated.   She has a history of depression and is on sertraline  and Seroquel . She does not want to wean off at this time due to upcoming surgery.     Review of Systems:  Review of Systems  Constitutional: Negative.   HENT: Negative.    Respiratory:  Negative for cough and shortness of breath.   Cardiovascular:  Negative for chest pain and leg swelling.  Gastrointestinal:  Positive for constipation, diarrhea, heartburn, nausea and vomiting. Negative for abdominal pain, blood in stool and melena.  Genitourinary: Negative.  Musculoskeletal:  Positive for joint pain. Negative for falls.  Neurological: Negative.   Psychiatric/Behavioral:  Positive for depression. Negative for memory loss. The patient is not nervous/anxious.     Past Medical History:  Diagnosis Date   AICD (automatic cardioverter/defibrillator) present    [employer] ICD   Anemia 09/2015   Anoxic brain injury (HCC)     Arthritis    knees (04/23/2017)   Asthma    teens; went away; came back (04/23/2017)   B12 deficiency anemia 04/23/2017   Cardiac arrest (HCC)    Chronic bronchitis (HCC)    Chronic low back pain with sciatica    Chronic lower back pain    Diabetes mellitus without complication (HCC)    Diabetes type 2, controlled (HCC)    Elevated ferritin level    Fatty liver    GERD (gastroesophageal reflux disease)    GERD with stricture    Headache    1-2/wk (04/23/2017)   History of blood transfusion plenty   related to anemia (04/23/2017)   Hypertension    Inguinal hernia    Low back pain    Migraine    1-2/month (04/23/2017)   Nausea and vomiting 03/01/2024   OA (osteoarthritis) of knee--left    Paroxysmal atrial fibrillation (HCC)    Sub-clinical -seen on device?, elevated CHADSVASC   Symptomatic anemia 04/23/2017   Vitamin B 12 deficiency    Past Surgical History:  Procedure Laterality Date   ABDOMINAL HYSTERECTOMY  YRS AGO   COMPLETE   CESAREAN SECTION  1998; 2002   ESOPHAGEAL DILATION  02/2020   by Dr Marvis   INGUINAL HERNIA REPAIR Bilateral 1980s    total of 4 surgeries (04/23/2017)   IR GASTROSTOMY TUBE MOD SED  12/13/2020   KNEE ARTHROSCOPY WITH MEDIAL MENISECTOMY Left 01/30/2018   Procedure: LEFT KNEE ARTHROSCOPY WITH PARTIAL LATERAL MENISCECTOMY;  Surgeon: Vernetta Lonni GRADE, MD;  Location: [facility] ORS;  Service: Orthopedics;  Laterality: Left;   KNEE CARTILAGE SURGERY Left    SUBQ ICD IMPLANT N/A 09/13/2021   Procedure: SUBQ ICD IMPLANT;  Surgeon: [name], MD;  Location: [facility] INVASIVE CV LAB;  Service: Cardiovascular;  Laterality: N/A;   TUBAL LIGATION  2002   Social History:   reports that she has been smoking cigarettes. She has never used smokeless tobacco. She reports that she does not currently use alcohol. She reports current drug use. Drug: Marijuana.  Family History  Problem Relation Age of Onset   Stroke Mother    Diabetes Mother     Diabetes Father    Stroke Maternal Grandmother    Diabetes Maternal Grandmother    Anemia Paternal Grandmother    Valvular heart disease Paternal Grandmother    Hypertension Mother    Prostate cancer Maternal Uncle        ? intestinal also    Medications: Patient's Medications  New Prescriptions   No medications on file  Previous Medications   ACCU-CHEK SOFTCLIX LANCETS LANCETS    Check blood sugars once daily Dx code E10.9   ACETAMINOPHEN  (TYLENOL ) 325 MG TABLET    Take 2 tablets (650 mg total) by mouth every 6 (six) hours as needed for headache or mild pain (pain score 1-3).   AMIODARONE  (PACERONE ) 200 MG TABLET    Take 2 tablets (400 mg total) by mouth daily.   APIXABAN  (ELIQUIS ) 5 MG TABS TABLET    Take 1 tablet (5 mg total) by mouth 2 (two) times daily.   BD PEN  NEEDLE NANO 2ND GEN 32G X 4 MM MISC    3 (three) times daily. as directed   BLOOD GLUCOSE MONITORING SUPPL (ACCU-CHEK GUIDE ME) W/DEVICE KIT    1 kit by Does not apply route as directed. Check blood sugars once daily Dx code E11.65   CETIRIZINE  (ZYRTEC ) 10 MG TABLET    Take 1 tablet (10 mg total) by mouth daily.   DOCUSATE SODIUM  (COLACE) 100 MG CAPSULE    Take 1 capsule (100 mg total) by mouth daily as needed.   DOXYCYCLINE  (VIBRA -TABS) 100 MG TABLET    Take 1 tablet (100 mg total) by mouth 2 (two) times daily. To be taken after surgery   GABAPENTIN  (NEURONTIN ) 100 MG CAPSULE    Take 100 mg by mouth 2 (two) times daily.   HYDROXYZINE  (ATARAX ) 10 MG TABLET    TAKE 1 TABLET(10 MG) BY MOUTH AT BEDTIME   LAMOTRIGINE  (LAMICTAL ) 100 MG TABLET    TAKE 1 TABLET BY MOUTH TWICE DAILY   LINACLOTIDE  (LINZESS ) 145 MCG CAPS CAPSULE    Take 145 mcg by mouth daily as needed (constipation).   LOSARTAN  (COZAAR ) 50 MG TABLET    Take 1 tablet (50 mg total) by mouth daily.   METHOCARBAMOL  (ROBAXIN ) 750 MG TABLET    Take 1 tablet (750 mg total) by mouth 3 (three) times daily as needed.   OMEPRAZOLE (PRILOSEC) 40 MG CAPSULE    Take 40 mg by  mouth in the morning and at bedtime.   ONDANSETRON  (ZOFRAN ) 4 MG TABLET    Take 1 tablet (4 mg total) by mouth every 8 (eight) hours as needed for nausea or vomiting.   OZEMPIC, 2 MG/DOSE, 8 MG/3ML SOPN    Inject 2 mg into the skin once a week.   PROPRANOLOL  (INDERAL ) 40 MG TABLET    Take 1 tablet (40 mg total) by mouth 2 (two) times daily.   QUETIAPINE  (SEROQUEL ) 100 MG TABLET    Take 100 mg by mouth at bedtime.   RIVASTIGMINE  (EXELON ) 3 MG CAPSULE    TAKE 1 CAPSULE(3 MG) BY MOUTH TWICE DAILY   ROSUVASTATIN  (CRESTOR ) 20 MG TABLET    Take 1.5 tablets (30 mg total) by mouth daily.   SERTRALINE  (ZOLOFT ) 100 MG TABLET    Take 1 tablet (100 mg total) by mouth daily.  Modified Medications   No medications on file  Discontinued Medications   No medications on file    Physical Exam:  Vitals:   03/05/24 1426  BP: 118/64  Pulse: 63  Temp: 98 F (36.7 C)  TempSrc: Temporal  SpO2: 97%  Weight: 192 lb 3.2 oz (87.2 kg)  Height: 5' 6 (1.676 m)   Body mass index is 31.02 kg/m. Wt Readings from Last 3 Encounters:  03/05/24 192 lb 3.2 oz (87.2 kg)  02/29/24 195 lb (88.5 kg)  02/23/24 210 lb 15.7 oz (95.7 kg)    Physical Exam Vitals reviewed.  HENT:     Head: Normocephalic.  Eyes:     General:        Right eye: No discharge.        Left eye: No discharge.  Cardiovascular:     Rate and Rhythm: Normal rate and regular rhythm.     Pulses: Normal pulses.     Heart sounds: Normal heart sounds.  Pulmonary:     Effort: Pulmonary effort is normal. No respiratory distress.     Breath sounds: Normal breath sounds. No wheezing.  Musculoskeletal:  Cervical back: Neck supple.     Right lower leg: No edema.     Left lower leg: No edema.  Skin:    General: Skin is warm.  Neurological:     General: No focal deficit present.     Mental Status: She is alert and oriented to person, place, and time.     Gait: Gait normal.  Psychiatric:        Mood and Affect: Mood normal.     Labs  reviewed: Basic Metabolic Panel: Recent Labs    02/29/24 1716 03/02/24 0810 03/03/24 0618 03/04/24 0301 03/05/24 0243  NA 138 136 136 136 137  K 3.6 4.1 4.3 4.0 4.0  CL 109 103 106 104 104  CO2 18* 23 22 21* 25  GLUCOSE 112* 84 93 88 93  BUN 17 13 12 19 18   CREATININE 0.96 0.83 0.86 0.93 1.16*  CALCIUM  9.1 8.9 8.8* 9.5 9.0  MG 1.6* 1.8 1.8 1.9  --   TSH 2.067  --   --   --   --    Liver Function Tests: Recent Labs    09/27/23 2123 10/17/23 0907 10/28/23 1552 11/05/23 0925 02/29/24 1716  AST 123*   < > 51* 33 23  ALT 204*   < > 96* 55* 17  ALKPHOS 319*  --   --   --  96  BILITOT 0.5   < > 0.4 0.4 0.5  PROT 8.1   < > 7.4 6.8 7.5  ALBUMIN 4.5  --   --   --  3.8   < > = values in this interval not displayed.   Recent Labs    02/29/24 2208  LIPASE 29   No results for input(s): AMMONIA in the last 8760 hours. CBC: Recent Labs    09/27/23 2123 01/29/24 0930 02/23/24 1534 02/29/24 1716  WBC 8.1 5.0 5.7 5.5  NEUTROABS 4.0  --  2.8 2.6  HGB 13.8 12.0 13.1 13.0  HCT 44.1 40.1 42.7 41.6  MCV 70.7* 74.8* 73.0* 72.7*  PLT 230 275 299 272   Lipid Panel: Recent Labs    04/25/23 0853 10/17/23 0907 11/05/23 0925  CHOL 157 122 127  HDL 49* 53 49*  LDLCALC 92 55 63  TRIG 72 62 66  CHOLHDL 3.2 2.3 2.6   TSH: Recent Labs    02/29/24 1716  TSH 2.067   A1C: Lab Results  Component Value Date   HGBA1C 5.9 (H) 01/29/2024     Assessment/Plan 1. Type 2 diabetes mellitus with cardiac complication (HCC) (Primary) - recent A1c 5.9 - some hypoglycemia last hospitalization - discontinue Ozempic - followed by [facility] in past> not seen in months  - monitor sugars at home - discussed diet low in carbs and sugars  2. ICD (implantable cardioverter-defibrillator) in place - followed by Dr. Almetta  - scheduled 11/24 for ablation   3. PAF (paroxysmal atrial fibrillation) (HCC) - VT and Atrial fib with RVR last hospitalization - improved with  amiodarone  and propranolol - cont Eliquis  - 11/24 scheduled for ablation  4. Nausea and vomiting, unspecified vomiting type - ongoing - began with Ozempic - advised to continue PPI - discussed bland diet and eating small meals during day - avoid acidic foods  - 11/16 KUB noted constipation  - followed by [facility]> reports being scheduled soon   5. Medication care plan discussed with patient - medications reviewed - will stop Zyrtec , colace, hydroxyzine , methocarbamol  - we will consider weaning  Seroquel  and Zoloft  after future procedures   6. Slow transit constipation - cont linzess   - discussed starting Miralax  every other day   Total time: 45 minutes. Greater than 50% of total time spent doing patient education regarding health maintenance, T2DM, N/V, atrial fib/ICD, medication review and bland diet including symptom/medication management.    Next appt: Visit date not found  Verdia Bolt Gil BODILY  [facility] [phone]

## 2024-03-05 NOTE — TOC Initial Note (Signed)
 Transition of Care [facility]) - Initial/Assessment Note    Patient Details  Name: Courtney Davies MRN: [id number] Date of Birth: 31-Dec-1975  Transition of Care [facility]) CM/SW Contact:    Courtney Barnie Rama, RN Phone Number: 03/05/2024, 10:10 AM  Clinical Narrative:                 From home with children, has PCP and insurance on file, states has [facility] services in place  but not sure of the name,  she has an aide for 2 hrs also.  NCM looked up and it is [facility], patient states she would like to continue with them they have only been out one time  since last admit.  NCM confirmed with Courtney Davies with [facility], will need HH orders, Has shower chair and bsc at home.  States family member  (mom) will transport them home at [employer] and family is support system, states gets medications from [facility] in [place] on [address].  Pta self ambulatory.  [facility] states she is active with HHPT, HHOT will need orders.    Expected Discharge Plan: Home w Home Health Services Barriers to Discharge: Continued Medical Work up   Patient Goals and CMS Choice Patient states their goals for this hospitalization and ongoing recovery are:: return home CMS Medicare.gov Compare Post Acute Care list provided to:: Patient Choice offered to / list presented to : Patient      Expected Discharge Plan and Services   Discharge Planning Services: CM Consult Post Acute Care Choice: Home Health Living arrangements for the past 2 months: Single Family Home                 DME Arranged: N/A DME Agency: NA       HH Arranged: PT, OT HH Agency: [facility] Date HH Agency Contacted: 03/05/24 Time HH Agency Contacted: 1009 Representative spoke with at [facility] Agency: Courtney Davies  Prior Living Arrangements/Services Living arrangements for the past 2 months: Single Family Home Lives with:: Adult Children Patient language and need for interpreter reviewed:: Yes Do you feel safe going back to the place where you live?: Yes       Need for Family Participation in Patient Care: Yes (Comment) Care giver support system in place?: Yes (comment) Current home services: DME, Home PT, Home OT (bsc, shower chair, active with [facility]) Criminal Activity/Legal Involvement Pertinent to Current Situation/Hospitalization: No - Comment as needed  Activities of Daily Living   ADL Screening (condition at time of admission) Independently performs ADLs?: No Does the patient have a NEW difficulty with bathing/dressing/toileting/self-feeding that is expected to last >3 days?: Yes (Initiates electronic notice to provider for possible OT consult) Does the patient have a NEW difficulty with getting in/out of bed, walking, or climbing stairs that is expected to last >3 days?: Yes (Initiates electronic notice to provider for possible PT consult) Does the patient have a NEW difficulty with communication that is expected to last >3 days?: No Is the patient deaf or have difficulty hearing?: No Does the patient have difficulty seeing, even when wearing glasses/contacts?: No Does the patient have difficulty concentrating, remembering, or making decisions?: No  Permission Sought/Granted Permission sought to share information with : Case Manager Permission granted to share information with : Yes, Verbal Permission Granted     Permission granted to share info w AGENCY: HH        Emotional Assessment Appearance:: Appears stated age Attitude/Demeanor/Rapport: Engaged Affect (typically observed): Appropriate Orientation: : Oriented to Self,  Oriented to Place, Oriented to  Time, Oriented to Situation Alcohol / Substance Use: Not Applicable Psych Involvement: No (comment)  Admission diagnosis:  VT (ventricular tachycardia) (HCC) [I47.20] ICD (implantable cardioverter-defibrillator) discharge [Z45.02] Atrial fibrillation, unspecified type (HCC) [I48.91] Patient Active Problem List   Diagnosis Date Noted   Nausea and vomiting  03/01/2024   Constipation 03/01/2024   PAF (paroxysmal atrial fibrillation) (HCC) 03/01/2024   History of gastritis 03/01/2024   Controlled type 2 diabetes mellitus without complication, without long-term current use of insulin  (HCC) 03/01/2024   History of anoxic brain injury 03/01/2024   VT (ventricular tachycardia) (HCC) 02/29/2024   ICD (implantable cardioverter-defibrillator) discharge 02/23/2024   Sleep disorder 06/05/2023   Neurocognitive deficits 05/22/2023   Cannabis use disorder 05/22/2023   Stroke (HCC) 01/08/2023   Mild intermittent asthma without complication 02/09/2022   Vitamin B12 deficiency 02/09/2022   MDD (major depressive disorder), single episode, moderate (HCC) 11/08/2021   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   Controlled type 2 diabetes mellitus with hyperglycemia (HCC)    Essential hypertension    Anoxic brain injury (HCC) 01/02/2021   Abdominal wall cellulitis 12/20/2020   Dysphagia    Goals of care, counseling/discussion    Acute systolic heart failure (HCC)    Encephalopathy acute    Type 1 diabetes mellitus without complication 12/02/2020   Hypertension 12/02/2020   GERD (gastroesophageal reflux disease) 12/02/2020   Anemia 12/02/2020   Cardiac arrest (HCC) 12/01/2020   Unilateral primary osteoarthritis, left knee 12/01/2018   Lateral meniscus, posterior horn derangement, left 05/14/2018   Status post arthroscopy of left knee 05/14/2018   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/19/2017   Anemia 04/23/2017   Acute bronchitis 04/23/2017   Chronic back pain 10/03/2015   Symptomatic anemia 04/10/2014   Bilateral edema of lower extremity    Low back pain with left-sided sciatica    PCP:  Courtney Greig BRAVO, NP Pharmacy:   [facility] [phone] - [place], [place] - [address] [place]  [address] Phone: [phone] Fax: [phone]  Courtney Davies Transitions of Care  Pharmacy [address] [place] [address] Phone: [phone] Fax: [phone]     Social Drivers of Health (SDOH) Social History: SDOH Screenings   Food Insecurity: Food Insecurity Present (02/29/2024)  Housing: Low Risk  (02/29/2024)  Transportation Needs: No Transportation Needs (02/29/2024)  Utilities: At Risk (02/29/2024)  Depression (PHQ2-9): Low Risk  (01/08/2024)  Recent Concern: Depression (PHQ2-9) - High Risk (12/19/2023)  Financial Resource Strain: High Risk (08/02/2023)  Social Connections: Unknown (07/04/2023)  Stress: Stress Concern Present (05/15/2023)  Tobacco Use: High Risk (02/24/2024)   SDOH Interventions:     Readmission Risk Interventions    03/05/2024   10:00 AM 02/26/2024   11:41 AM  Readmission Risk Prevention Plan  Transportation Screening Complete Complete  PCP or Specialist Appt within 5-7 Days  Complete  Home Care Screening  Complete  Medication Review (RN Care Manager) Complete   PCP or Specialist appointment within 3-5 days of discharge Complete   HRI or Home Care Consult Complete   Palliative Care Screening Not Applicable   Skilled Nursing Facility Not Applicable

## 2024-03-06 ENCOUNTER — Telehealth: Payer: Self-pay

## 2024-03-06 ENCOUNTER — Encounter (HOSPITAL_COMMUNITY): Payer: Self-pay

## 2024-03-06 NOTE — Telephone Encounter (Signed)
 Attempted to reach patient to discuss upcoming procedure, no answer. Left detailed message with procedure date/time/instructions and return call back number for any additional questions. Will send updated letter to MyChart with new arrival time.

## 2024-03-06 NOTE — Transitions of Care (Post Inpatient/ED Visit) (Signed)
   03/06/2024  Name: GRAE LEATHERS MRN: [id number] DOB: 05-23-1975  Today's TOC FU Call Status: Today's TOC FU Call Status:: Successful TOC FU Call Completed TOC FU Call Complete Date: 03/06/24  Patient's Name and Date of Birth confirmed. Name, DOB  Transition Care Management Follow-up Telephone Call Date of Discharge: 03/05/24 Discharge Facility: [facility] Type of Discharge: Inpatient Admission Primary Inpatient Discharge Diagnosis:: ICD (implantable cardioverter-defibrillator) discharge How have you been since you were released from the hospital?: Better Any questions or concerns?: No  Items Reviewed: Did you receive and understand the discharge instructions provided?: Yes Medications obtained,verified, and reconciled?: Yes (Medications Reviewed) (verbally states that she has all her medications and is taking them as prescribed.)  Medications Reviewed Today: verbally states that she has all her medications and is taking them as prescribed. Medications Reviewed Today   Medications were not reviewed in this encounter       Placed call to patient who reports that she is doing well. Reports that she has already had her hospital follow up with PCP. Reports that she understands all her instructions and has her medications. Declines need for review of instructions again. Encouraged patient to call back if needed.   TOC and assessments not completed  per patient decline.  Alan Ee, RN, BSN, CEN [employer]- Transition of Care Team.  [facility] [phone]

## 2024-03-06 NOTE — Telephone Encounter (Signed)
 Received a return call from patient to discuss upcoming procedure.   CT: not required Labs: completed.   Any missed doses of blood thinner?  No Advised patient to continue taking Eliquis  (Apixaban ) twice daily without missing any doses.  Medication instructions:  On the morning of your procedure take ONLY Lamotrigine  with a small sip of water . Hold all other medications including Eliquis  (Apixaban ) or the procedure may be rescheduled. Nothing to eat or drink after midnight prior to your procedure.  Confirmed patient is scheduled for Atrial Fibrillation Ablation on Monday, November 24 with Dr. Almetta. Instructed patient to arrive at the Main Entrance A at [facility]: [address] [place], [place] [address] and check in at Admitting at 7:30 AM.   Plan to go home the same day, you will only stay overnight if medically necessary. You MUST have a responsible adult to drive you home and MUST be with you the first 24 hours after you arrive home or your procedure could be cancelled.  Informed patient a nurse will call a day before the procedure to confirm arrival time and ensure instructions are followed.  Patient verbalized understanding to all instructions provided and agreed to proceed with procedure.   Advised patient to contact RN Navigator at [phone], to inform of any new medications started after call or concerns prior to procedure.

## 2024-03-06 NOTE — Pre-Procedure Instructions (Signed)
 Instructed patient on the following items: Arrival time 0730 Nothing to eat or drink after midnight No meds AM of procedure Responsible person to drive you home and stay with you for 24 hrs  Have you missed any doses of anti-coagulant Eliquis - takes twice a day, hasn't missed any doses in last 4 weeks.  Don't take dose morning of procedure.  Ok to take Lamotrigine  -anti seizure medicine morning of procedure with sips of water .

## 2024-03-09 ENCOUNTER — Ambulatory Visit (HOSPITAL_COMMUNITY)
Admission: RE | Admit: 2024-03-09 | Discharge: 2024-03-09 | Disposition: A | Discharge status: Home / Self Care | Attending: Student in an Organized Health Care Education/Training Program | Admitting: Student in an Organized Health Care Education/Training Program

## 2024-03-09 ENCOUNTER — Ambulatory Visit (HOSPITAL_COMMUNITY)
Admission: RE | Disposition: A | Discharge status: Home / Self Care | Payer: Self-pay | Source: Home / Self Care | Attending: Student in an Organized Health Care Education/Training Program

## 2024-03-09 ENCOUNTER — Ambulatory Visit (HOSPITAL_COMMUNITY): Payer: Self-pay | Admitting: Vascular Surgery

## 2024-03-09 ENCOUNTER — Encounter (HOSPITAL_COMMUNITY): Payer: Self-pay | Admitting: Student in an Organized Health Care Education/Training Program

## 2024-03-09 ENCOUNTER — Other Ambulatory Visit: Payer: Self-pay

## 2024-03-09 ENCOUNTER — Encounter: Admitting: Psychology

## 2024-03-09 DIAGNOSIS — G931 Anoxic brain damage, not elsewhere classified: Secondary | ICD-10-CM | POA: Insufficient documentation

## 2024-03-09 DIAGNOSIS — R413 Other amnesia: Secondary | ICD-10-CM | POA: Insufficient documentation

## 2024-03-09 DIAGNOSIS — Z7901 Long term (current) use of anticoagulants: Secondary | ICD-10-CM | POA: Insufficient documentation

## 2024-03-09 DIAGNOSIS — Z79899 Other long term (current) drug therapy: Secondary | ICD-10-CM | POA: Insufficient documentation

## 2024-03-09 DIAGNOSIS — Z87891 Personal history of nicotine dependence: Secondary | ICD-10-CM | POA: Insufficient documentation

## 2024-03-09 DIAGNOSIS — I110 Hypertensive heart disease with heart failure: Secondary | ICD-10-CM | POA: Diagnosis not present

## 2024-03-09 DIAGNOSIS — I5032 Chronic diastolic (congestive) heart failure: Secondary | ICD-10-CM | POA: Insufficient documentation

## 2024-03-09 DIAGNOSIS — J45909 Unspecified asthma, uncomplicated: Secondary | ICD-10-CM

## 2024-03-09 DIAGNOSIS — I480 Paroxysmal atrial fibrillation: Secondary | ICD-10-CM

## 2024-03-09 DIAGNOSIS — I4819 Other persistent atrial fibrillation: Secondary | ICD-10-CM | POA: Insufficient documentation

## 2024-03-09 DIAGNOSIS — Z95810 Presence of automatic (implantable) cardiac defibrillator: Secondary | ICD-10-CM | POA: Insufficient documentation

## 2024-03-09 DIAGNOSIS — D6869 Other thrombophilia: Secondary | ICD-10-CM | POA: Insufficient documentation

## 2024-03-09 DIAGNOSIS — E119 Type 2 diabetes mellitus without complications: Secondary | ICD-10-CM | POA: Diagnosis not present

## 2024-03-09 DIAGNOSIS — K219 Gastro-esophageal reflux disease without esophagitis: Secondary | ICD-10-CM | POA: Insufficient documentation

## 2024-03-09 DIAGNOSIS — F329 Major depressive disorder, single episode, unspecified: Secondary | ICD-10-CM | POA: Diagnosis not present

## 2024-03-09 DIAGNOSIS — I10 Essential (primary) hypertension: Secondary | ICD-10-CM

## 2024-03-09 DIAGNOSIS — I428 Other cardiomyopathies: Secondary | ICD-10-CM | POA: Insufficient documentation

## 2024-03-09 DIAGNOSIS — F17210 Nicotine dependence, cigarettes, uncomplicated: Secondary | ICD-10-CM

## 2024-03-09 HISTORY — PX: ATRIAL FIBRILLATION ABLATION: EP1191

## 2024-03-09 LAB — GLUCOSE, CAPILLARY
Glucose-Capillary: 101 mg/dL — ABNORMAL HIGH (ref 70–99)
Glucose-Capillary: 93 mg/dL (ref 70–99)

## 2024-03-09 LAB — POCT ACTIVATED CLOTTING TIME: Activated Clotting Time: 406 s

## 2024-03-09 SURGERY — ATRIAL FIBRILLATION ABLATION
Anesthesia: General

## 2024-03-09 MED ORDER — PHENYLEPHRINE HCL-NACL 20-0.9 MG/250ML-% IV SOLN
INTRAVENOUS | Status: DC | PRN
  Administered 2024-03-09: 35 ug/min via INTRAVENOUS

## 2024-03-09 MED ORDER — ALBUTEROL SULFATE HFA 108 (90 BASE) MCG/ACT IN AERS
INHALATION_SPRAY | RESPIRATORY_TRACT | Status: DC | PRN
  Administered 2024-03-09: 10 via RESPIRATORY_TRACT

## 2024-03-09 MED ORDER — ACETAMINOPHEN 325 MG PO TABS
650.0000 mg | ORAL_TABLET | ORAL | Status: DC | PRN
  Administered 2024-03-09: 650 mg via ORAL
  Filled 2024-03-09: qty 2

## 2024-03-09 MED ORDER — HEPARIN (PORCINE) IN NACL 1000-0.9 UT/500ML-% IV SOLN
INTRAVENOUS | Status: DC | PRN
Start: 2024-03-09 — End: 2024-03-09
  Administered 2024-03-09 (×2): 500 mL

## 2024-03-09 MED ORDER — OXYCODONE HCL 5 MG/5ML PO SOLN: 5.0000 mg | Freq: Once | ORAL | Status: AC | PRN

## 2024-03-09 MED ORDER — SODIUM CHLORIDE 0.9 % IV SOLN: INTRAVENOUS | Status: DC

## 2024-03-09 MED ORDER — SODIUM CHLORIDE 0.9% FLUSH: 3.0000 mL | INTRAVENOUS | Status: DC | PRN

## 2024-03-09 MED ORDER — CEFAZOLIN SODIUM-DEXTROSE 2-4 GM/100ML-% IV SOLN
INTRAVENOUS | Status: AC
  Filled 2024-03-09: qty 100

## 2024-03-09 MED ORDER — FENTANYL CITRATE (PF) 250 MCG/5ML IJ SOLN
INTRAMUSCULAR | Status: DC | PRN
  Administered 2024-03-09: 100 ug via INTRAVENOUS

## 2024-03-09 MED ORDER — LIDOCAINE 2% (20 MG/ML) 5 ML SYRINGE
INTRAMUSCULAR | Status: DC | PRN
  Administered 2024-03-09: 60 mg via INTRAVENOUS

## 2024-03-09 MED ORDER — PHENYLEPHRINE 80 MCG/ML (10ML) SYRINGE FOR IV PUSH (FOR BLOOD PRESSURE SUPPORT)
PREFILLED_SYRINGE | INTRAVENOUS | Status: DC | PRN
  Administered 2024-03-09 (×3): 160 ug via INTRAVENOUS

## 2024-03-09 MED ORDER — PROTAMINE SULFATE 10 MG/ML IV SOLN
INTRAVENOUS | Status: DC | PRN
Start: 2024-03-09 — End: 2024-03-09
  Administered 2024-03-09: 50 mg via INTRAVENOUS

## 2024-03-09 MED ORDER — ROCURONIUM BROMIDE 10 MG/ML (PF) SYRINGE
PREFILLED_SYRINGE | INTRAVENOUS | Status: DC | PRN
  Administered 2024-03-09: 70 mg via INTRAVENOUS

## 2024-03-09 MED ORDER — ONDANSETRON HCL 4 MG/2ML IJ SOLN
4.0000 mg | Freq: Four times a day (QID) | INTRAMUSCULAR | Status: DC | PRN
Start: 2024-03-09 — End: 2024-03-09

## 2024-03-09 MED ORDER — OXYCODONE HCL 5 MG PO TABS
ORAL_TABLET | ORAL | Status: AC
  Filled 2024-03-09: qty 1

## 2024-03-09 MED ORDER — HEPARIN SODIUM (PORCINE) 1000 UNIT/ML IJ SOLN
INTRAMUSCULAR | Status: DC | PRN
Start: 2024-03-09 — End: 2024-03-09
  Administered 2024-03-09: 1000 [IU] via INTRAVENOUS

## 2024-03-09 MED ORDER — FENTANYL CITRATE (PF) 100 MCG/2ML IJ SOLN
INTRAMUSCULAR | Status: AC
  Filled 2024-03-09: qty 2

## 2024-03-09 MED ORDER — SUGAMMADEX SODIUM 200 MG/2ML IV SOLN
INTRAVENOUS | Status: DC | PRN
  Administered 2024-03-09: 200 mg via INTRAVENOUS

## 2024-03-09 MED ORDER — OXYCODONE HCL 5 MG PO TABS
5.0000 mg | ORAL_TABLET | Freq: Once | ORAL | Status: AC | PRN
  Administered 2024-03-09: 5 mg via ORAL

## 2024-03-09 MED ORDER — PROPOFOL 10 MG/ML IV BOLUS
INTRAVENOUS | Status: DC | PRN
Start: 2024-03-09 — End: 2024-03-09
  Administered 2024-03-09: 200 mg via INTRAVENOUS

## 2024-03-09 MED ORDER — ONDANSETRON HCL 4 MG/2ML IJ SOLN
INTRAMUSCULAR | Status: DC | PRN
  Administered 2024-03-09: 4 mg via INTRAVENOUS

## 2024-03-09 MED ORDER — HEPARIN SODIUM (PORCINE) 1000 UNIT/ML IJ SOLN
INTRAMUSCULAR | Status: DC | PRN
  Administered 2024-03-09: 18000 [IU] via INTRAVENOUS

## 2024-03-09 MED ORDER — CEFAZOLIN SODIUM-DEXTROSE 2-3 GM-%(50ML) IV SOLR
INTRAVENOUS | Status: DC | PRN
  Administered 2024-03-09: 2 g via INTRAVENOUS

## 2024-03-09 MED ORDER — APIXABAN 5 MG PO TABS
5.0000 mg | ORAL_TABLET | Freq: Two times a day (BID) | ORAL | Status: DC
  Administered 2024-03-09: 5 mg via ORAL
  Filled 2024-03-09: qty 1

## 2024-03-09 MED ORDER — SODIUM CHLORIDE 0.9% FLUSH: 3.0000 mL | Freq: Two times a day (BID) | INTRAVENOUS | Status: DC

## 2024-03-09 MED ORDER — SODIUM CHLORIDE 0.9 % IV SOLN
250.0000 mL | INTRAVENOUS | Status: DC | PRN
Start: 2024-03-09 — End: 2024-03-09

## 2024-03-09 SURGICAL SUPPLY — 17 items
BLANKET WARM UNDERBOD FULL ACC (MISCELLANEOUS) ×1 IMPLANT
CABLE VARIPULSE STERILE (CATHETERS) IMPLANT
CATH GE 8FR SOUNDSTAR (CATHETERS) IMPLANT
CATH OCTARAY 2.0 F 3-3-3-3-3 (CATHETERS) IMPLANT
CATH WEBSTER BI DIR CS D-F CRV (CATHETERS) IMPLANT
CATHETER VARIPULSE 8.5FR (CATHETERS) IMPLANT
CLOSURE PERCLOSE PROSTYLE (Vascular Products) IMPLANT
COVER SWIFTLINK CONNECTOR (BAG) ×1 IMPLANT
KIT VERSACROSS 8.5F 63 45D 180 (KITS) IMPLANT
PACK EP LF (CUSTOM PROCEDURE TRAY) ×1 IMPLANT
PAD DEFIB RADIO PHYSIO CONN (PAD) ×1 IMPLANT
PATCH CARTO3 (PAD) IMPLANT
SHEATH CARTO VIZIGO SM CVD (SHEATH) IMPLANT
SHEATH PINNACLE 8F 10CM (SHEATH) IMPLANT
SHEATH PINNACLE 9F 10CM (SHEATH) IMPLANT
SHEATH PROBE COVER 6X72 (BAG) IMPLANT
TUBING SMART ABLATE COOLFLOW (TUBING) IMPLANT

## 2024-03-09 NOTE — Interval H&P Note (Signed)
 History and Physical Interval Note:  03/09/2024 9:04 AM  Courtney Davies  has presented today for surgery, with the diagnosis of afib.  The various methods of treatment have been discussed with the patient and family. After consideration of risks, benefits and other options for treatment, the patient has consented to  Procedure(s): ATRIAL FIBRILLATION ABLATION (N/A) as a surgical intervention.  The patient's history has been reviewed, patient examined, no change in status, stable for surgery.  I have reviewed the patient's chart and labs.  Questions were answered to the patient's satisfaction.     Courtney Davies

## 2024-03-09 NOTE — Progress Notes (Signed)
 Up and walked and tolerated well; right groin stable, no bleeding or hematoma; Dr Almetta in and ok to d/c home; Dr Almetta notified of client c/o headache and no new orders noted

## 2024-03-09 NOTE — Anesthesia Procedure Notes (Signed)
 Procedure Name: Intubation Date/Time: 03/09/2024 9:44 AM  Performed by: Marva Lonni PARAS, CRNAPre-anesthesia Checklist: Patient identified, Emergency Drugs available, Suction available and Patient being monitored Patient Re-evaluated:Patient Re-evaluated prior to induction Oxygen Delivery Method: Circle System Utilized Preoxygenation: Pre-oxygenation with 100% oxygen Induction Type: IV induction Ventilation: Mask ventilation without difficulty Laryngoscope Size: Mac and 3 Grade View: Grade I Tube type: Oral Tube size: 7.0 mm Number of attempts: 1 Airway Equipment and Method: Stylet and Oral airway Placement Confirmation: ETT inserted through vocal cords under direct vision, positive ETCO2 and breath sounds checked- equal and bilateral Secured at: 21 cm Tube secured with: Tape Dental Injury: Teeth and Oropharynx as per pre-operative assessment

## 2024-03-09 NOTE — Discharge Instructions (Signed)

## 2024-03-09 NOTE — Anesthesia Preprocedure Evaluation (Addendum)
 Anesthesia Evaluation  Patient identified by MRN, date of birth, ID band Patient awake    Reviewed: Allergy & Precautions, H&P , NPO status , Patient's Chart, lab work & pertinent test results  Airway Mallampati: II  TM Distance: >3 FB Neck ROM: Full    Dental  (+) Dental Advisory Given   Pulmonary asthma , Current Smoker and Patient abstained from smoking.   Pulmonary exam normal breath sounds clear to auscultation       Cardiovascular hypertension, Pt. on medications Normal cardiovascular exam+ Cardiac Defibrillator  Rhythm:Regular Rate:Normal     Neuro/Psych  Headaches   Depression    CVA  negative psych ROS   GI/Hepatic Neg liver ROS,GERD  ,,  Endo/Other  negative endocrine ROSdiabetes, Type 2    Renal/GU negative Renal ROS  negative genitourinary   Musculoskeletal  (+) Arthritis , Osteoarthritis,    Abdominal  (+) + obese  Peds negative pediatric ROS (+)  Hematology negative hematology ROS (+)   Anesthesia Other Findings   Reproductive/Obstetrics negative OB ROS                              Anesthesia Physical Anesthesia Plan  ASA: 3  Anesthesia Plan: General   Post-op Pain Management:    Induction: Intravenous  PONV Risk Score and Plan: 2 and Ondansetron , Midazolam  and Treatment may vary due to age or medical condition  Airway Management Planned: Oral ETT  Additional Equipment:   Intra-op Plan:   Post-operative Plan: Extubation in OR  Informed Consent: I have reviewed the patients History and Physical, chart, labs and discussed the procedure including the risks, benefits and alternatives for the proposed anesthesia with the patient or authorized representative who has indicated his/her understanding and acceptance.     Dental advisory given  Plan Discussed with: CRNA  Anesthesia Plan Comments: (See PAT note written 01/31/2024 by Allison Zelenak, PA-C. Vfib  arrest 11/2020. No definite cause identified in work-up, so s/p Architect.  Her last cardiology visit was on 07/08/2023 with Hao Meng, PA for follow-up and preoperative evaluation. He wrote: Preoperative cardiac clearance for left total knee surgery Undergoing preoperative evaluation for left total knee surgery. Moderate risk due to cardiac history, but surgery is low risk. Cleared for surgery from cardiac perspective - previous coronary CT shows no significant CAD - Hold Eliquis  for three days prior to the procedure and restart as soon as possible afterward at the surgeon's discretion. - Use a large Tegaderm to tape a magnet over the ICD during the surgery to prevent interference from electrocautery. - Follow up with Dr. Inocencio in six months. )         Anesthesia Quick Evaluation

## 2024-03-09 NOTE — Anesthesia Postprocedure Evaluation (Signed)
 Anesthesia Post Note  Patient: Courtney Davies  Procedure(s) Performed: ATRIAL FIBRILLATION ABLATION     Patient location during evaluation: PACU Anesthesia Type: General Level of consciousness: awake and alert Pain management: pain level controlled Vital Signs Assessment: post-procedure vital signs reviewed and stable Respiratory status: spontaneous breathing, nonlabored ventilation and respiratory function stable Cardiovascular status: blood pressure returned to baseline and stable Postop Assessment: no apparent nausea or vomiting Anesthetic complications: no   No notable events documented.  Last Vitals:  Vitals:   03/09/24 1145 03/09/24 1150  BP: (!) 155/93 (!) 149/89  Pulse: 67 68  Resp: (!) 0 14  Temp:    SpO2: 100% 100%    Last Pain:  Vitals:   03/09/24 1143  TempSrc: Oral  PainSc:                  Butler Levander Pinal

## 2024-03-09 NOTE — Transfer of Care (Signed)
 Immediate Anesthesia Transfer of Care Note  Patient: Courtney Davies  Procedure(s) Performed: ATRIAL FIBRILLATION ABLATION  Patient Location: Cath Lab  Anesthesia Type:General  Level of Consciousness: awake, alert , oriented, and patient cooperative  Airway & Oxygen Therapy: Patient Spontanous Breathing  Post-op Assessment: Report given to RN and Post -op Vital signs reviewed and stable  Post vital signs: Reviewed and stable  Last Vitals:  Vitals Value Taken Time  BP 144/96 03/09/24 11:15  Temp    Pulse 73 03/09/24 11:17  Resp 15 03/09/24 11:16  SpO2 100 % 03/09/24 11:17  Vitals shown include unfiled device data.  Last Pain:  Vitals:   03/09/24 0746  TempSrc: Oral  PainSc:          Complications: No notable events documented.

## 2024-03-09 NOTE — Op Note (Addendum)
   Procedure:  Intracardiac catheter ablation AFIB including Transseptal Cath (CPT 93656)  Pre-Op  Diagnosis: persistent AF  Post-Op Diagnosis: same   Procedure Date:  03/09/24   Attending: Adina Primus, MD   Anesthesia: general anesthesia   Initial Intervals: NSR, PR 134, QRS 79, QT 389, RR 709  Procedure: The patient entered the EP lab in a fasting nonsedated state. The procedural time-out was achieved. BSCI S-ICD tachy therapies were disabled by industry. The patient was placed under general anesthesia by Anesthesiology. Once the patient was draped and prepped in normal fashion with multiple layers of Hibiclens  scrub in the bilateral groins, access to the veins was achieved under ultrasound guidance using the modified Seldinger technique. Following access both sites were pre closed with Perclose ProStyle suture mediated closure.    Access/sheath: - RFV: 8 Fr short sheath->8.5 Fr Sm curl Vizigo - RFV: 9 Fr short sheath   Catheters: - 8.5 Fr SoundStar ICE - Varipulse  PFA catheter - OctaRay 3-3-3-3-3 mapping catheter  - Webster CS D/F decapolar catheter  Transseptal Access Systemic heparinization was given to maintain ACT's >350. Transseptal puncture was performed with the VersaCross wire through the Vizigo sheath using ICE and fluoroscopy. The sheath was carefully aspirated and flushed.      Mapping: Next, a deflectable OctaRay was advanced into the left atrium through the Vizigo sheath.  A 3-dimensional electroanatomic map was constructed of the left atrium and pulmonary veins using the Carto mapping system.     Ablation: The OctaRay was removed from the Vizigo sheath and the Carto Varipulse PFA catheter was advanced to the LSPV os. The PFA catheter was advanced to each of the 4 pulmonary veins and at least 4 PFA applications were applied per vein. Additional lesions were applied across both carinas and the RPV septum. Following initial ablation, repeat mapping with the  Varipulse catheter confirmed first pass isolation in all 4 veins.   During mapping and prior to ablation of the RPVs she had a short run of what appeared to be atrial flutter or organized AF. It then degenerated into AF before returning to NSR. Burst pacing from CS down to 200 ms without any inducible flutter so empiric CTI ablation was not performed. Additionally anterior voltage was normal so empiric mitral ablation also not performed.    Heparinization was then reversed with protamine . Catheters and sheaths were pulled and hemostasis obtained with Perclose ProStyle sutures. No complications were evident. Final ICE assessment without pericardial effusion. BSCI S-ICD tachy therapies were re-enabled by industry. The patient was transported to post procedure holding.   Final intervals: NSR, AH 48, HV 62, PR 132, QRS 71, QT 474, RR 788   Summary: NSR on arrival   Successful transseptal puncture x 1 with ICE guidance  3-D mapping of the LA and PV's   Successful PV isolation ([facility]) using J&J Varipulse PFA    Recommendations: Bedrest x 2 hours  Anti-coagulation: Resume apixaban  5 mg bid at 1300  Anti-platelet: none  Anti-arrhythmic: resume OP amiodarone  400 mg bid, reduce to once daily in one week  Rate control: continue OP propranolol  40 mg bid EP f/u to be scheduled   Donnice DELENA Primus, MD [facility]  Cardiac Electrophysiology

## 2024-03-10 ENCOUNTER — Telehealth (HOSPITAL_COMMUNITY): Payer: Self-pay

## 2024-03-10 MED FILL — Cefazolin Sodium-Dextrose IV Solution 2 GM/100ML-4%: INTRAVENOUS | Qty: 100 | Status: AC

## 2024-03-10 MED FILL — Fentanyl Citrate Preservative Free (PF) Inj 100 MCG/2ML: INTRAMUSCULAR | Qty: 2 | Status: AC

## 2024-03-10 NOTE — Telephone Encounter (Signed)
 Spoke with patient to complete post procedure follow up call.  Patient reports no complications with groin sites.   Instructions reviewed with patient:  Remove large bandage at puncture site after 24 hours. It is normal to have bruising, tenderness, mild swelling, and a pea or marble sized lump/knot at the groin site which can take up to three months to resolve.  Get help right away if you notice sudden swelling at the puncture site.  Check your puncture site every day for signs of infection: fever, redness, swelling, pus drainage, warmth, foul odor or excessive pain. If this occurs, please call [phone], to speak with the RN Navigator. Get help right away if your puncture site is bleeding and the bleeding does not stop after applying firm pressure to the area.  You may continue to have skipped beats/ atrial fibrillation during the first several months after your procedure.  It is very important not to miss any doses of your blood thinner Eliquis .    You will follow up with the Afib clinic 4 weeks after your procedure and follow up with the APP 3 months after your procedure.  Activity restrictions reviewed.  Patient verbalized understanding to all instructions provided.

## 2024-03-13 ENCOUNTER — Other Ambulatory Visit: Payer: Self-pay

## 2024-03-13 ENCOUNTER — Encounter (HOSPITAL_BASED_OUTPATIENT_CLINIC_OR_DEPARTMENT_OTHER): Payer: Self-pay

## 2024-03-13 ENCOUNTER — Emergency Department (HOSPITAL_BASED_OUTPATIENT_CLINIC_OR_DEPARTMENT_OTHER)

## 2024-03-13 ENCOUNTER — Emergency Department (HOSPITAL_BASED_OUTPATIENT_CLINIC_OR_DEPARTMENT_OTHER)
Admission: EM | Admit: 2024-03-13 | Discharge: 2024-03-13 | Disposition: A | Discharge status: Home / Self Care | Attending: Emergency Medicine | Admitting: Emergency Medicine

## 2024-03-13 DIAGNOSIS — J029 Acute pharyngitis, unspecified: Secondary | ICD-10-CM | POA: Diagnosis not present

## 2024-03-13 DIAGNOSIS — R109 Unspecified abdominal pain: Secondary | ICD-10-CM | POA: Insufficient documentation

## 2024-03-13 DIAGNOSIS — Z79899 Other long term (current) drug therapy: Secondary | ICD-10-CM | POA: Diagnosis not present

## 2024-03-13 DIAGNOSIS — K219 Gastro-esophageal reflux disease without esophagitis: Secondary | ICD-10-CM | POA: Insufficient documentation

## 2024-03-13 DIAGNOSIS — Z7984 Long term (current) use of oral hypoglycemic drugs: Secondary | ICD-10-CM | POA: Insufficient documentation

## 2024-03-13 DIAGNOSIS — M791 Myalgia, unspecified site: Secondary | ICD-10-CM | POA: Insufficient documentation

## 2024-03-13 DIAGNOSIS — R059 Cough, unspecified: Secondary | ICD-10-CM | POA: Insufficient documentation

## 2024-03-13 DIAGNOSIS — Z7901 Long term (current) use of anticoagulants: Secondary | ICD-10-CM | POA: Insufficient documentation

## 2024-03-13 DIAGNOSIS — R519 Headache, unspecified: Secondary | ICD-10-CM | POA: Diagnosis not present

## 2024-03-13 DIAGNOSIS — E119 Type 2 diabetes mellitus without complications: Secondary | ICD-10-CM | POA: Insufficient documentation

## 2024-03-13 DIAGNOSIS — R112 Nausea with vomiting, unspecified: Secondary | ICD-10-CM | POA: Insufficient documentation

## 2024-03-13 DIAGNOSIS — R0981 Nasal congestion: Secondary | ICD-10-CM | POA: Insufficient documentation

## 2024-03-13 DIAGNOSIS — Z91040 Latex allergy status: Secondary | ICD-10-CM | POA: Insufficient documentation

## 2024-03-13 DIAGNOSIS — I10 Essential (primary) hypertension: Secondary | ICD-10-CM | POA: Insufficient documentation

## 2024-03-13 LAB — RESP PANEL BY RT-PCR (RSV, FLU A&B, COVID)  RVPGX2
Influenza A by PCR: NEGATIVE
Influenza B by PCR: NEGATIVE
Resp Syncytial Virus by PCR: NEGATIVE
SARS Coronavirus 2 by RT PCR: NEGATIVE

## 2024-03-13 LAB — COMPREHENSIVE METABOLIC PANEL WITH GFR
ALT: 21 U/L (ref 0–44)
AST: 19 U/L (ref 15–41)
Albumin: 4.4 g/dL (ref 3.5–5.0)
Alkaline Phosphatase: 126 U/L (ref 38–126)
Anion gap: 11 (ref 5–15)
BUN: 13 mg/dL (ref 6–20)
CO2: 25 mmol/L (ref 22–32)
Calcium: 9.3 mg/dL (ref 8.9–10.3)
Chloride: 106 mmol/L (ref 98–111)
Creatinine, Ser: 0.9 mg/dL (ref 0.44–1.00)
GFR, Estimated: >60 mL/min (ref >60)
Glucose, Bld: 92 mg/dL (ref 70–99)
Potassium: 4 mmol/L (ref 3.5–5.1)
Sodium: 141 mmol/L (ref 135–145)
Total Bilirubin: <0.2 mg/dL (ref 0.0–1.2)
Total Protein: 7.8 g/dL (ref 6.5–8.1)

## 2024-03-13 LAB — URINALYSIS, ROUTINE W REFLEX MICROSCOPIC
Bilirubin Urine: NEGATIVE
Glucose, UA: NEGATIVE mg/dL
Hgb urine dipstick: NEGATIVE
Ketones, ur: NEGATIVE mg/dL
Leukocytes,Ua: NEGATIVE
Nitrite: NEGATIVE
Protein, ur: NEGATIVE mg/dL
Specific Gravity, Urine: 1.02 (ref 1.005–1.030)
pH: 7 (ref 5.0–8.0)

## 2024-03-13 LAB — CBC
HCT: 40.9 % (ref 36.0–46.0)
Hemoglobin: 12.8 g/dL (ref 12.0–15.0)
MCH: 22.7 pg — ABNORMAL LOW (ref 26.0–34.0)
MCHC: 31.3 g/dL (ref 30.0–36.0)
MCV: 72.6 fL — ABNORMAL LOW (ref 80.0–100.0)
Platelets: 204 K/uL (ref 150–400)
RBC: 5.63 MIL/uL — ABNORMAL HIGH (ref 3.87–5.11)
RDW: 15.2 % (ref 11.5–15.5)
WBC: 5.5 K/uL (ref 4.0–10.5)
nRBC: 0 % (ref 0.0–0.2)

## 2024-03-13 LAB — LIPASE, BLOOD: Lipase: 45 U/L (ref 11–51)

## 2024-03-13 LAB — PROTIME-INR
INR: 1.1 (ref 0.8–1.2)
Prothrombin Time: 14.7 s (ref 11.4–15.2)

## 2024-03-13 MED ORDER — ALUM & MAG HYDROXIDE-SIMETH 200-200-20 MG/5ML PO SUSP
30.0000 mL | Freq: Once | ORAL | Status: AC
  Administered 2024-03-13: 30 mL via ORAL
  Filled 2024-03-13: qty 30

## 2024-03-13 MED ORDER — ONDANSETRON HCL 4 MG/2ML IJ SOLN
4.0000 mg | Freq: Once | INTRAMUSCULAR | Status: AC
  Administered 2024-03-13: 4 mg via INTRAVENOUS
  Filled 2024-03-13: qty 2

## 2024-03-13 MED ORDER — SODIUM CHLORIDE 0.9 % IV BOLUS
500.0000 mL | Freq: Once | INTRAVENOUS | Status: AC
  Administered 2024-03-13: 500 mL via INTRAVENOUS

## 2024-03-13 MED ORDER — ONDANSETRON HCL 4 MG PO TABS: 4.0000 mg | ORAL_TABLET | Freq: Four times a day (QID) | ORAL | 0 refills | Status: AC

## 2024-03-13 NOTE — ED Notes (Signed)
 Ambulated to bathroom without difficulty

## 2024-03-13 NOTE — ED Provider Notes (Signed)
 [name] Provider Note   CSN: [id number] Arrival date & time: 03/13/24  1335     Patient presents with: Emesis   Courtney Davies is a 48 y.o. female.   48 y.o female with a PMH of Afib s/p ablation on 03/09/2024, GERD, diabetes, hypertension anticoagulated on Eliquis  presents to the ED with cough, sore throat, body aches, headache.  Patient also endorses severe nausea and vomiting.  For the past week she has not been able to keep down.  She did have the ablation 4 days ago, states that she has been recovering well with suprapubic reports very sore.  She feels that she is congested, she has tried some over-the-counter medication with a milligram, ginger ale, honey, tea without any improvement in her symptoms.  She also reports getting a influenza vaccine, COVID-19 vaccine, pneumonia vaccine this past season.  She has not recorded any fevers but she reports feeling very unwell.  Her last bowel movement was yesterday and this was normal.  No fever, no chest pain, no shortness of breath.  The history is provided by the patient.  Emesis Associated symptoms: abdominal pain, cough and myalgias   Associated symptoms: no chills, no diarrhea, no fever and no sore throat        Prior to Admission medications   Medication Sig Start Date End Date Taking? Authorizing Provider  ondansetron  (ZOFRAN ) 4 MG tablet Take 1 tablet (4 mg total) by mouth every 6 (six) hours for 5 days. 03/13/24 03/18/24 Yes Malvin Morrish, PA-C  Accu-Chek Softclix Lancets lancets Check blood sugars once daily Dx code E10.9 12/26/23   [name], [name], NP  acetaminophen  (TYLENOL ) 325 MG tablet Take 2 tablets (650 mg total) by mouth every 6 (six) hours as needed for headache or mild pain (pain score 1-3). 02/26/24   Aniceto Daphne [name], NP  amiodarone  (PACERONE ) 200 MG tablet Take 2 tablets (400 mg total) by mouth daily. 03/05/24   Trudy Birmingham, PA-C  apixaban  (ELIQUIS ) 5 MG TABS tablet  Take 1 tablet (5 mg total) by mouth 2 (two) times daily. 02/20/24   Lesia Ozell [name], PA-C  BD PEN NEEDLE NANO 2ND GEN 32G X 4 MM MISC 3 (three) times daily. as directed 07/11/21   [provider]  Blood Glucose Monitoring Suppl (ACCU-CHEK GUIDE ME) w/Device KIT 1 kit by Does not apply route as directed. Check blood sugars once daily Dx code E11.65 12/26/23   [name] BRAVO, NP  gabapentin  (NEURONTIN ) 100 MG capsule Take 100 mg by mouth 2 (two) times daily.    [provider]  lamoTRIgine  (LAMICTAL ) 100 MG tablet TAKE 1 TABLET BY MOUTH TWICE DAILY 02/28/24   Babs Arthea [name], MD  linaclotide  (LINZESS ) 145 MCG CAPS capsule Take 145 mcg by mouth daily as needed (constipation).    [provider]  losartan  (COZAAR ) 50 MG tablet Take 1 tablet (50 mg total) by mouth daily. 03/05/24   Williams, Evan, PA-C  omeprazole (PRILOSEC) 40 MG capsule Take 40 mg by mouth in the morning and at bedtime.    [provider]  propranolol  (INDERAL ) 40 MG tablet Take 1 tablet (40 mg total) by mouth 2 (two) times daily. 03/05/24   Williams, Evan, PA-C  QUEtiapine  (SEROQUEL ) 100 MG tablet Take 100 mg by mouth at bedtime. 02/29/24   [provider]  rivastigmine  (EXELON ) 3 MG capsule TAKE 1 CAPSULE(3 MG) BY MOUTH TWICE DAILY 08/09/23   Babs Arthea [name], MD  rosuvastatin  (CRESTOR ) 20 MG tablet Take 1.5 tablets (30 mg total) by mouth daily. 09/26/23   [name], [name], NP  sertraline  (ZOLOFT ) 100 MG tablet Take 1 tablet (100 mg total) by mouth daily. 01/09/24   Arfeen, Leni [name], MD    Allergies: Latex, Other, Codeine , and Nitrofurantoin    Review of Systems  Constitutional:  Negative for chills and fever.  HENT:  Negative for sore throat.   Respiratory:  Positive for cough and wheezing. Negative for shortness of breath.   Cardiovascular:  Negative for chest pain.  Gastrointestinal:  Positive for abdominal pain, nausea and vomiting. Negative for blood in stool, constipation and  diarrhea.  Genitourinary:  Negative for flank pain.  Musculoskeletal:  Positive for myalgias.  All other systems reviewed and are negative.   Updated Vital Signs BP (!) 163/94   Pulse 66   Temp 98.3 F (36.8 C) (Oral)   Resp 16   LMP 12/03/2012   SpO2 100%   Physical Exam Vitals and nursing note reviewed.  Constitutional:      Appearance: Normal appearance.  HENT:     Head: Normocephalic and atraumatic.     Mouth/Throat:     Mouth: Mucous membranes are moist.     Comments: Unable to fully visualize her oropharynx Cardiovascular:     Rate and Rhythm: Normal rate.  Pulmonary:     Effort: Pulmonary effort is normal.     Breath sounds: No wheezing.     Comments: Mild wheezing located to the right upper lobe. Abdominal:     General: Abdomen is flat.     Palpations: Abdomen is soft.     Tenderness: There is no abdominal tenderness.  Musculoskeletal:     Cervical back: Normal range of motion and neck supple.     Right lower leg: No edema.     Left lower leg: No edema.  Skin:    General: Skin is warm and dry.  Neurological:     Mental Status: She is alert and oriented to person, place, and time.     (all labs ordered are listed, but only abnormal results are displayed) Labs Reviewed  CBC - Abnormal; Notable for the following components:      Result Value   RBC 5.63 (*)    MCV 72.6 (*)    MCH 22.7 (*)    All other components within normal limits  RESP PANEL BY RT-PCR (RSV, FLU A&B, COVID)  RVPGX2  LIPASE, BLOOD  COMPREHENSIVE METABOLIC PANEL WITH GFR  URINALYSIS, ROUTINE W REFLEX MICROSCOPIC  PROTIME-INR    EKG: EKG Interpretation Date/Time:  Friday March 13 2024 13:55:25 EST Ventricular Rate:  71 PR Interval:  160 QRS Duration:  77 QT Interval:  405 QTC Calculation: 441 R Axis:   82  Text Interpretation: Sinus rhythm Probable anteroseptal infarct, old Confirmed by Cottie Cough [phone]) on 03/13/2024 3:05:00 PM  Radiology: DG Chest Portable 1  View Result Date: 03/13/2024 CLINICAL DATA:  Cough EXAM: PORTABLE CHEST 1 VIEW COMPARISON:  Chest x-ray 02/29/2024 FINDINGS: Lead overlies the left chest, unchanged in position. The heart size and mediastinal contours are within normal limits. Both lungs are clear. The visualized skeletal structures are unremarkable. IMPRESSION: No active disease. Electronically Signed   By: Greig Pique M.D.   On: 03/13/2024 15:26     Procedures   Medications Ordered in the ED  sodium chloride  0.9 % bolus 500 mL (0 mLs Intravenous Stopped 03/13/24 1520)  ondansetron  (ZOFRAN ) injection 4 mg (4  mg Intravenous Given 03/13/24 1428)  alum & mag hydroxide-simeth (MAALOX/MYLANTA) 200-200-20 MG/5ML suspension 30 mL (30 mLs Oral Given 03/13/24 1604)                                    Medical Decision Making Amount and/or Complexity of Data Reviewed Labs: ordered. Radiology: ordered.  Risk OTC drugs. Prescription drug management.   This patient presents to the ED for concern of nausea and vomiting, this involves a number of treatment options, and is a complaint that carries with it a high risk of complications and morbidity.  The differential diagnosis includes viral illness, hyperemesis versus obstruction.    Co morbidities: Discussed in HPI   Brief History:  See HPI.   EMR reviewed including pt PMHx, past surgical history and past visits to ER.   See HPI for more details   Lab Tests:  I ordered and independently interpreted labs.  The pertinent results include:    I personally reviewed all laboratory work and imaging. Metabolic panel without any acute abnormality specifically kidney function within normal limits and no significant electrolyte abnormalities. CBC without leukocytosis or significant anemia.   Imaging Studies:  NAD. I personally reviewed all imaging studies and no acute abnormality found. I agree with radiology interpretation.  Cardiac Monitoring:  The patient was  maintained on a cardiac monitor.  I personally viewed and interpreted the cardiac monitored which showed an underlying rhythm of: NSR 71 EKG non-ischemic   Medicines ordered:  I ordered medication including Zofran , fluids, GI cocktail for symptomatic treatment Reevaluation of the patient after these medicines showed that the patient improved I have reviewed the patients home medicines and have made adjustments as needed  Reevaluation:  After the interventions noted above I re-evaluated patient and found that they have :resolved  Social Determinants of Health:  The patient's social determinants of health were a factor in the care of this patient    Problem List / ED Course:  Patient returned to the ED with a chief complaint of nausea, vomiting which has been ongoing for the past week, reports she has not been able to keep anything down.  Did have an ablation on 1124, reports she is recovering from this however she does feel that her voice is somewhat hoarse and there is some pain whenever she swallows.  She has been trying over-the-counter medication including hot tea along with honey without much improvement in symptoms.  Her evaluation is overall benign, her abdomen is soft nontender to palpation, no focal point of tenderness to either.  Clear to auscultation with any wheezing, rhonchi or rales. Today's visit showed a CBC with no leukocytosis, hemoglobin is within normal limits.  CMP with no electrolyte derangement despite her ongoing vomiting.  Her PT/INR are normal.  She continues to be anticoagulated on Eliquis .  UA without any infectious signs.  Respiratory panel is negative for influenza, COVID-19, RSV.  Patient was normal. Serial abdominal exams performed after receiving medication such as Zofran , fluids, GI cocktail with resolution in her symptoms.  Abdomen remains benign.  She is actually tolerating crackers while in the emergency department.  We discussed not obtaining imaging at  this time.  She does report is a recurrent complaint for her, therefore comfortable sending patient home at this time medically stable.  She will go home on a short prescription of Zofran  to help with symptomatic treatment.  She is hemodynamically stable  for discharge.   Dispostion:  After consideration of the diagnostic results and the patients response to treatment, I feel that the patent would benefit from close follow up with PCP.     Portions of this note were generated with Scientist, clinical (histocompatibility and immunogenetics). Dictation errors may occur despite best attempts at proofreading.   Final diagnoses:  Nausea and vomiting, unspecified vomiting type    ED Discharge Orders          Ordered    ondansetron  (ZOFRAN ) 4 MG tablet  Every 6 hours        03/13/24 1626               Nycole Kawahara, PA-C 03/13/24 1636    Patsey Lot, MD 03/14/24 1514

## 2024-03-13 NOTE — ED Notes (Signed)
 ED Provider at bedside.

## 2024-03-13 NOTE — ED Notes (Signed)
 EDP at bedside

## 2024-03-13 NOTE — Discharge Instructions (Addendum)
 You laboratory results are within normal limits, please continue to hydrate with plenty of fluids.  You were given a patient for Zofran , please take this medication as prescribed.  Follow-up with your primary care physician as needed.

## 2024-03-13 NOTE — ED Notes (Signed)
Pt given soda to drink.

## 2024-03-13 NOTE — ED Triage Notes (Signed)
 Pt reports multiple episodes of emesis, productive cough & headache X 4 days. States I can't keep anything down.   Had ablation for afib on 11/24

## 2024-03-18 ENCOUNTER — Encounter: Payer: Self-pay | Admitting: Physical Medicine & Rehabilitation

## 2024-03-18 ENCOUNTER — Encounter: Attending: Physical Medicine & Rehabilitation | Admitting: Physical Medicine & Rehabilitation

## 2024-03-18 DIAGNOSIS — R413 Other amnesia: Secondary | ICD-10-CM | POA: Insufficient documentation

## 2024-03-18 DIAGNOSIS — F321 Major depressive disorder, single episode, moderate: Secondary | ICD-10-CM | POA: Insufficient documentation

## 2024-03-18 DIAGNOSIS — R4586 Emotional lability: Secondary | ICD-10-CM | POA: Diagnosis present

## 2024-03-18 DIAGNOSIS — G479 Sleep disorder, unspecified: Secondary | ICD-10-CM | POA: Insufficient documentation

## 2024-03-18 DIAGNOSIS — G931 Anoxic brain damage, not elsewhere classified: Secondary | ICD-10-CM | POA: Diagnosis present

## 2024-03-18 MED ORDER — SERTRALINE HCL 100 MG PO TABS: 150.0000 mg | ORAL_TABLET | Freq: Every day | ORAL | 4 refills | Status: AC

## 2024-03-18 NOTE — Patient Instructions (Addendum)
 ALWAYS FEEL FREE TO CALL OUR OFFICE WITH ANY PROBLEMS OR QUESTIONS [phone])  **PLEASE NOTE** ALL MEDICATION REFILL REQUESTS (INCLUDING CONTROLLED SUBSTANCES) NEED TO BE MADE AT LEAST 7 DAYS PRIOR TO REFILL BEING DUE. ANY REFILL REQUESTS INSIDE THAT TIME FRAME MAY RESULT IN DELAYS IN RECEIVING YOUR PRESCRIPTION.     CONTINUE SEROQUEL  100MG  AT BEDTIME CONTINUE LAMICTAL  100MG  ONE TABLET TWICE DAILY   INCREASE ZOLOFT  TO 150MG  (1 AND 1/2 TABLETS) DAILY

## 2024-03-18 NOTE — Progress Notes (Signed)
 Subjective:    Patient ID: Courtney Davies, female    DOB: April 02, 1976, 48 y.o.   MRN: [id number]  HPI  [name] is here in follow up of her ABI. I last saw her 2 mos ago. We made adjustments to her med regimen. She has an ablation for afib since we last met.   She hasn't tried looking for any more work as she needs  to take care of herself'.    Her mood has been fairly stable. She still gets irritated around people, especially family. She knows to walk away from episodes. She tends to stay by herself a lot.      Pain Inventory Average Pain 10 Pain Right Now 10 My pain is constant, dull, stabbing, and aching  In the last 24 hours, has pain interfered with the following? General activity 10 Relation with others 10 Enjoyment of life 10 What TIME of day is your pain at its worst? morning , daytime, evening, and night Sleep (in general) Fair  Pain is worse with: walking, bending, sitting, and some activites Pain improves with:  nothing relieves the pain Relief from Meds: 0  Family History  Problem Relation Age of Onset   Stroke Mother    Diabetes Mother    Diabetes Father    Stroke Maternal Grandmother    Diabetes Maternal Grandmother    Anemia Paternal Grandmother    Valvular heart disease Paternal Grandmother    Hypertension Mother    Prostate cancer Maternal Uncle        ? intestinal also   Social History   Socioeconomic History   Marital status: Single    Spouse name: Not on file   Number of children: 2   Years of education: 11th   Highest education level: Not on file  Occupational History   Occupation: salad maker in restaurant  Tobacco Use   Smoking status: Every Day    Types: Cigarettes   Smokeless tobacco: Never  Vaping Use   Vaping status: Never Used  Substance and Sexual Activity   Alcohol use: Not Currently    Comment: occ   Drug use: Yes    Types: Marijuana    Comment: daily   Sexual activity: Yes    Birth control/protection: Surgical   Other Topics Concern   Not on file  Social History Narrative   Lives at home with children   Right-handed.   Social Drivers of Corporate Investment Banker Strain: High Risk (08/02/2023)   Overall Financial Resource Strain (CARDIA)    Difficulty of Paying Living Expenses: Very hard  Food Insecurity: Food Insecurity Present (02/29/2024)   Hunger Vital Sign    Worried About Running Out of Food in the Last Year: Sometimes true    Ran Out of Food in the Last Year: Sometimes true  Transportation Needs: No Transportation Needs (02/29/2024)   PRAPARE - Administrator, Civil Service (Medical): No    Lack of Transportation (Non-Medical): No  Physical Activity: Not on file  Stress: Stress Concern Present (05/15/2023)   [employer] of Occupational Health - Occupational Stress Questionnaire    Feeling of Stress : To some extent  Social Connections: Unknown (07/04/2023)   Social Connection and Isolation Panel    Frequency of Communication with Friends and Family: Never    Frequency of Social Gatherings with Friends and Family: Never    Attends Religious Services: 1 to 4 times per year    Active Member of Clubs or  Organizations: No    Attends Banker Meetings: Never    Marital Status: Patient declined   Past Surgical History:  Procedure Laterality Date   ABDOMINAL HYSTERECTOMY  YRS AGO   COMPLETE   ATRIAL FIBRILLATION ABLATION N/A 03/09/2024   Procedure: ATRIAL FIBRILLATION ABLATION;  Surgeon: Almetta Donnice LABOR, MD;  Location: [facility] INVASIVE CV LAB;  Service: Cardiovascular;  Laterality: N/A;   CESAREAN SECTION  1998; 2002   ESOPHAGEAL DILATION  02/2020   by Dr Marvis   INGUINAL HERNIA REPAIR Bilateral 1980s    total of 4 surgeries (04/23/2017)   IR GASTROSTOMY TUBE MOD SED  12/13/2020   KNEE ARTHROSCOPY WITH MEDIAL MENISECTOMY Left 01/30/2018   Procedure: LEFT KNEE ARTHROSCOPY WITH PARTIAL LATERAL MENISCECTOMY;  Surgeon: Vernetta Lonni GRADE, MD;   Location: [facility] OR[facility];  Service: Orthopedics;  Laterality: Left;   KNEE CARTILAGE SURGERY Left    SUBQ ICD IMPLANT N/A 09/13/2021   Procedure: SUBQ ICD IMPLANT;  Surgeon: [name], MD;  Location: [facility] INVASIVE CV LAB;  Service: Cardiovascular;  Laterality: N/A;   TUBAL LIGATION  2002   Past Surgical History:  Procedure Laterality Date   ABDOMINAL HYSTERECTOMY  YRS AGO   COMPLETE   ATRIAL FIBRILLATION ABLATION N/A 03/09/2024   Procedure: ATRIAL FIBRILLATION ABLATION;  Surgeon: Almetta Donnice LABOR, MD;  Location: [facility] INVASIVE CV LAB;  Service: Cardiovascular;  Laterality: N/A;   CESAREAN SECTION  1998; 2002   ESOPHAGEAL DILATION  02/2020   by Dr Marvis   INGUINAL HERNIA REPAIR Bilateral 1980s    total of 4 surgeries (04/23/2017)   IR GASTROSTOMY TUBE MOD SED  12/13/2020   KNEE ARTHROSCOPY WITH MEDIAL MENISECTOMY Left 01/30/2018   Procedure: LEFT KNEE ARTHROSCOPY WITH PARTIAL LATERAL MENISCECTOMY;  Surgeon: Vernetta Lonni GRADE, MD;  Location: [facility];  Service: Orthopedics;  Laterality: Left;   KNEE CARTILAGE SURGERY Left    SUBQ ICD IMPLANT N/A 09/13/2021   Procedure: SUBQ ICD IMPLANT;  Surgeon: [name], MD;  Location: [facility] INVASIVE CV LAB;  Service: Cardiovascular;  Laterality: N/A;   TUBAL LIGATION  2002   Past Medical History:  Diagnosis Date   AICD (automatic cardioverter/defibrillator) present    [employer] ICD   Anemia 09/2015   Anoxic brain injury (HCC)    Arthritis    knees (04/23/2017)   Asthma    teens; went away; came back (04/23/2017)   B12 deficiency anemia 04/23/2017   Cardiac arrest (HCC)    Chronic bronchitis (HCC)    Chronic low back pain with sciatica    Chronic lower back pain    Diabetes mellitus without complication (HCC)    Diabetes type 2, controlled (HCC)    Elevated ferritin level    Fatty liver    GERD (gastroesophageal reflux disease)    GERD with stricture    Headache    1-2/wk (04/23/2017)   History of blood transfusion  plenty   related to anemia (04/23/2017)   Hypertension    Inguinal hernia    Low back pain    Migraine    1-2/month (04/23/2017)   Nausea and vomiting 03/01/2024   OA (osteoarthritis) of knee--left    Paroxysmal atrial fibrillation (HCC)    Sub-clinical -seen on device?, elevated CHADSVASC   Symptomatic anemia 04/23/2017   Vitamin B 12 deficiency    BP 126/83 (BP Location: Right Arm, Patient Position: Sitting, Cuff Size: Normal)   Pulse 69   Ht 5' 6 (1.676 m)   Wt 201  lb 12.8 oz (91.5 kg)   LMP 12/03/2012   SpO2 98%   BMI 32.57 kg/m   Opioid Risk Score:   Fall Risk Score:  `1  Depression screen [facility] 2/9     03/05/2024    7:26 PM 01/08/2024    9:56 AM 12/19/2023   10:59 AM 12/04/2023    8:28 AM 10/02/2023    9:34 AM 08/01/2023    3:50 PM 07/04/2023    3:24 PM  Depression screen PHQ 2/9  Decreased Interest 0 0   1 1 1   Down, Depressed, Hopeless 0 0   1 1 1   PHQ - 2 Score 0 0   2 2 2   Altered sleeping      1 0  Tired, decreased energy      0 1  Change in appetite      0 0  Feeling bad or failure about yourself       0 0  Trouble concentrating      3 3  Moving slowly or fidgety/restless      0 3  Suicidal thoughts      0 0  PHQ-9 Score      6  9   Difficult doing work/chores       Not difficult at all     Information is confidential and restricted. Go to Review Flowsheets to unlock data.   Data saved with a previous flowsheet row definition     Review of Systems  Musculoskeletal:  Positive for arthralgias.       Bilateral knee pain  All other systems reviewed and are negative.      Objective:   Physical Exam  Constitutional: No distress . Vital signs reviewed. HEENT: NCAT, EOMI, oral membranes moist Neck: supple Cardiovascular: RRR without murmur. No JVD    Respiratory/Chest: CTA Bilaterally without wheezes or rales. Normal effort    GI/Abdomen: BS +, non-tender, non-distended Ext: no clubbing, cyanosis, or edema Psych: pleasant, sl impulsive  Skin:  intact Neuro: poor attention, less than a minute, impulsive, impaired short term memory to a certain extent. Normal language and speech. Cranial nerve exam unremarkable. MMT:   Motor 5/5 in all 4's. Sensory normal.  Musculoskeletal: antalgic LLE--KNEE stable   Assessment & Plan:  1.  Decline in ADL, mobility , and cognition secondary to anoxic brain injury -revisit VR at some point when she's ready physically -doing ok with driving 2. Left knee pain: TKA this Fall potentially             voltaren  gel prn.  4. Mood/sleep:               -maintin seroquel  at 100mg  at night for sleep/mood stability             --increase zoloft  to 150mg  daily   -church involvement encouraged -maintain lamictal  at 100mg  bid -psychiatry follow up-  -neuropsych appt is in January with Dr. Hayden 5. T2DM per primary?: 6. HTN: 11. SVT/NSVT: On coreg  and amiodarone  bid for rate control.               -s/p ICD/pacer, ablation              -continue per cardiology.    20  minutes of face to face patient care time were spent during this visit. All questions were encouraged and answered.  Follow up with me in 4 mos. I encouraged her to call me if any issues prior to her follow-up visit.

## 2024-03-19 ENCOUNTER — Ambulatory Visit (HOSPITAL_COMMUNITY): Admitting: Psychiatry

## 2024-03-19 ENCOUNTER — Encounter (HOSPITAL_COMMUNITY): Payer: Self-pay | Admitting: Psychiatry

## 2024-03-19 VITALS — BP 171/96 | HR 63 | Ht 66.0 in | Wt 191.0 lb

## 2024-03-19 DIAGNOSIS — F321 Major depressive disorder, single episode, moderate: Secondary | ICD-10-CM | POA: Diagnosis not present

## 2024-03-19 DIAGNOSIS — F1290 Cannabis use, unspecified, uncomplicated: Secondary | ICD-10-CM

## 2024-03-19 DIAGNOSIS — R413 Other amnesia: Secondary | ICD-10-CM | POA: Diagnosis not present

## 2024-03-19 DIAGNOSIS — R29818 Other symptoms and signs involving the nervous system: Secondary | ICD-10-CM

## 2024-03-19 DIAGNOSIS — R4586 Emotional lability: Secondary | ICD-10-CM

## 2024-03-19 DIAGNOSIS — R4189 Other symptoms and signs involving cognitive functions and awareness: Secondary | ICD-10-CM

## 2024-03-19 NOTE — Progress Notes (Signed)
 [facility] MD/PA/NP OP Progress Note  03/19/2024 11:28 AM Courtney Davies  MRN:  [id number]  Chief Complaint:  Chief Complaint  Patient presents with   Follow-up   HPI: Patient came today for her follow-up appointment.  She recently had cardiac procedure.  She was upset because she been to the emergency room few times and finally they were able to find the problem and had procedure.  She reported things are somewhat better as she decided not to talk to the family members.  She is sleeping on and off.  She still struggle with memory issues and do not remember very well about the medication dosage.  She mostly talked about her family issues.  Patient told family from her father side is difficult to get along.  She usually talks to her mother every day.  She lives with her 2 adult son who are very helpful and supportive.  Patient has not able to find a job.  We have recommended vocational rehab but patient has not heard.  She also complaining of leg pain.  She saw Dr. Brigida yesterday but do not remember why.  Patient believe he will order and schedule the knee surgery but she have not heard from him.  She believes taking all her medication as prescribed.  Her medicines are prescribed by her primary care and rehab doctor.  She still get emotional, labile, upset and angry when talk about her psychosocial and chronic health issues.  However she feel that she is not getting as aggressive or agitated and learn how to cope by walking away from the situation.  She also cut down her cannabis.  She denies any hallucination, paranoia or suicidal thoughts.  She admitted having trust issues on people.  She gets short temper and angry when she do not like something.  She reports sleep is better.  When asked about taking Seroquel , Zoloft  and Lamictal  she reply she is taking all her medications.  She denies any anhedonia, hopelessness, suicidal thoughts.   Visit Diagnosis:    ICD-10-CM   1. MDD (major depressive disorder),  single episode, moderate (HCC)  F32.1     2. Memory impairment  R41.3     3. Labile mood  R45.86     4. Cannabis use disorder  F12.90     5. Neurocognitive deficits  R29.818    R41.89         Past Psychiatric History: Reviewed  Past Medical History:  Past Medical History:  Diagnosis Date   AICD (automatic cardioverter/defibrillator) present    [employer] ICD   Anemia 09/2015   Anoxic brain injury (HCC)    Arthritis    knees (04/23/2017)   Asthma    teens; went away; came back (04/23/2017)   B12 deficiency anemia 04/23/2017   Cardiac arrest (HCC)    Chronic bronchitis (HCC)    Chronic low back pain with sciatica    Chronic lower back pain    Diabetes mellitus without complication (HCC)    Diabetes type 2, controlled (HCC)    Elevated ferritin level    Fatty liver    GERD (gastroesophageal reflux disease)    GERD with stricture    Headache    1-2/wk (04/23/2017)   History of blood transfusion plenty   related to anemia (04/23/2017)   Hypertension    Inguinal hernia    Low back pain    Migraine    1-2/month (04/23/2017)   Nausea and vomiting 03/01/2024   OA (osteoarthritis) of  knee--left    Paroxysmal atrial fibrillation (HCC)    Sub-clinical -seen on device?, elevated CHADSVASC   Symptomatic anemia 04/23/2017   Vitamin B 12 deficiency     Past Surgical History:  Procedure Laterality Date   ABDOMINAL HYSTERECTOMY  YRS AGO   COMPLETE   ATRIAL FIBRILLATION ABLATION N/A 03/09/2024   Procedure: ATRIAL FIBRILLATION ABLATION;  Surgeon: Almetta Donnice LABOR, MD;  Location: [facility] INVASIVE CV LAB;  Service: Cardiovascular;  Laterality: N/A;   CESAREAN SECTION  1998; 2002   ESOPHAGEAL DILATION  02/2020   by Dr Marvis   INGUINAL HERNIA REPAIR Bilateral 1980s    total of 4 surgeries (04/23/2017)   IR GASTROSTOMY TUBE MOD SED  12/13/2020   KNEE ARTHROSCOPY WITH MEDIAL MENISECTOMY Left 01/30/2018   Procedure: LEFT KNEE ARTHROSCOPY WITH PARTIAL LATERAL  MENISCECTOMY;  Surgeon: Vernetta Lonni GRADE, MD;  Location: [facility] OR[facility];  Service: Orthopedics;  Laterality: Left;   KNEE CARTILAGE SURGERY Left    SUBQ ICD IMPLANT N/A 09/13/2021   Procedure: SUBQ ICD IMPLANT;  Surgeon: [name], MD;  Location: [facility] INVASIVE CV LAB;  Service: Cardiovascular;  Laterality: N/A;   TUBAL LIGATION  2002    Family Psychiatric History: No history of psychiatric inpatient.  History of cutting wrist in 2022.  History of cardiac arrest resulting in neurocognitive impairment, impaired memory and emotional dysregulation.  As per EMR took Celexa , Depakote  in 2022 during hospitalization on the medical floor.  Saw once Dr Bronson and given Seroquel  and Zoloft .  Family History:  Family History  Problem Relation Age of Onset   Stroke Mother    Diabetes Mother    Diabetes Father    Stroke Maternal Grandmother    Diabetes Maternal Grandmother    Anemia Paternal Grandmother    Valvular heart disease Paternal Grandmother    Hypertension Mother    Prostate cancer Maternal Uncle        ? intestinal also    Social History:  Social History   Socioeconomic History   Marital status: Single    Spouse name: Not on file   Number of children: 2   Years of education: 11th   Highest education level: Not on file  Occupational History   Occupation: scientist, product/process development in restaurant  Tobacco Use   Smoking status: Every Day    Types: Cigarettes   Smokeless tobacco: Never  Vaping Use   Vaping status: Never Used  Substance and Sexual Activity   Alcohol use: Not Currently    Comment: occ   Drug use: Yes    Types: Marijuana    Comment: daily   Sexual activity: Yes    Birth control/protection: Surgical  Other Topics Concern   Not on file  Social History Narrative   Lives at home with children   Right-handed.   Social Drivers of Corporate Investment Banker Strain: High Risk (08/02/2023)   Overall Financial Resource Strain (CARDIA)    Difficulty of Paying Living  Expenses: Very hard  Food Insecurity: Food Insecurity Present (02/29/2024)   Hunger Vital Sign    Worried About Running Out of Food in the Last Year: Sometimes true    Ran Out of Food in the Last Year: Sometimes true  Transportation Needs: No Transportation Needs (02/29/2024)   PRAPARE - Administrator, Civil Service (Medical): No    Lack of Transportation (Non-Medical): No  Physical Activity: Not on file  Stress: Stress Concern Present (05/15/2023)   [employer] of Occupational Health -  Occupational Stress Questionnaire    Feeling of Stress : To some extent  Social Connections: Unknown (07/04/2023)   Social Connection and Isolation Panel    Frequency of Communication with Friends and Family: Never    Frequency of Social Gatherings with Friends and Family: Never    Attends Religious Services: 1 to 4 times per year    Active Member of [employer] or Organizations: No    Attends Banker Meetings: Never    Marital Status: Patient declined    Allergies:  Allergies  Allergen Reactions   Latex Hives   Other Other (See Comments), Anaphylaxis, Swelling and Hives    Hair glue causes throat to close and hives glue Hair glue causes throat to close    Codeine  Nausea And Vomiting    Was on an empty stomach.   Nitrofurantoin Rash    Metabolic Disorder Labs: Lab Results  Component Value Date   HGBA1C 5.9 (H) 01/29/2024   MPG 122.63 01/29/2024   MPG 157.07 01/09/2023   No results found for: PROLACTIN Lab Results  Component Value Date   CHOL 127 11/05/2023   TRIG 66 11/05/2023   HDL 49 (L) 11/05/2023   CHOLHDL 2.6 11/05/2023   VLDL 17 01/09/2023   LDLCALC 63 11/05/2023   LDLCALC 55 10/17/2023   Lab Results  Component Value Date   TSH 2.067 02/29/2024   TSH 0.44 12/06/2022    Therapeutic Level Labs: No results found for: LITHIUM Lab Results  Component Value Date   VALPROATE 51 01/19/2021   VALPROATE 20 (L) 01/05/2021   No results found  for: CBMZ  Current Medications: Current Outpatient Medications  Medication Sig Dispense Refill   Accu-Chek Softclix Lancets lancets Check blood sugars once daily Dx code E10.9 100 each 12   acetaminophen  (TYLENOL ) 325 MG tablet Take 2 tablets (650 mg total) by mouth every 6 (six) hours as needed for headache or mild pain (pain score 1-3).     amiodarone  (PACERONE ) 200 MG tablet Take 2 tablets (400 mg total) by mouth daily. 60 tablet 1   apixaban  (ELIQUIS ) 5 MG TABS tablet Take 1 tablet (5 mg total) by mouth 2 (two) times daily. 60 tablet 5   BD PEN NEEDLE NANO 2ND GEN 32G X 4 MM MISC 3 (three) times daily. as directed     Blood Glucose Monitoring Suppl (ACCU-CHEK GUIDE ME) w/Device KIT 1 kit by Does not apply route as directed. Check blood sugars once daily Dx code E11.65 1 kit 0   gabapentin  (NEURONTIN ) 100 MG capsule Take 100 mg by mouth 2 (two) times daily.     lamoTRIgine  (LAMICTAL ) 100 MG tablet TAKE 1 TABLET BY MOUTH TWICE DAILY 60 tablet 5   linaclotide  (LINZESS ) 145 MCG CAPS capsule Take 145 mcg by mouth daily as needed (constipation).     losartan  (COZAAR ) 50 MG tablet Take 1 tablet (50 mg total) by mouth daily. 30 tablet 1   omeprazole (PRILOSEC) 40 MG capsule Take 40 mg by mouth in the morning and at bedtime.     propranolol  (INDERAL ) 40 MG tablet Take 1 tablet (40 mg total) by mouth 2 (two) times daily. 60 tablet 1   QUEtiapine  (SEROQUEL ) 100 MG tablet Take 100 mg by mouth at bedtime.     rosuvastatin  (CRESTOR ) 20 MG tablet Take 1.5 tablets (30 mg total) by mouth daily. 135 tablet 3   sertraline  (ZOLOFT ) 100 MG tablet Take 1.5 tablets (150 mg total) by mouth daily. 45 tablet 4  No current facility-administered medications for this visit.     Musculoskeletal: Strength & Muscle Tone: within normal limits Gait & Station: normal Patient leans: N/A  Psychiatric Specialty Exam: Review of Systems  Musculoskeletal:        Left leg pain  Psychiatric/Behavioral:  Positive for  decreased concentration. The patient is nervous/anxious and is hyperactive.     Blood pressure (!) 171/96, pulse 63, height 5' 6 (1.676 m), weight 191 lb (86.6 kg), last menstrual period 12/03/2012.There is no height or weight on file to calculate BMI.  General Appearance: Casual  Eye Contact:  Fair  Speech:  fast  Volume:  Increased  Mood:  Angry, Dysphoric, and Irritable  Affect:  Labile  Thought Process:  Descriptions of Associations: Circumstantial  Orientation:  Full (Time, Place, and Person)  Thought Content: Rumination   Suicidal Thoughts:  No  Homicidal Thoughts:  No  Memory:  Immediate;   Fair Recent;   Fair Remote;   Fair  Judgement:  Fair  Insight:  Shallow  Psychomotor Activity:  Increased and Restlessness  Concentration:  Concentration: Fair and Attention Span: Fair  Recall:  [employer] of Knowledge: Fair  Language: Good  Akathisia:  No  Handed:  Right  AIMS (if indicated): not done  Assets:  Communication Skills Desire for Improvement Housing Transportation  ADL's:  Intact  Cognition: Impaired,  Mild  Sleep:  Fair   Screenings: GAD-7    Advertising Copywriter from 12/19/2023 in [facility] [place] Counselor from 12/04/2023 in [facility] at [facility] Patient Outreach Telephone from 08/01/2023 in [place] [facility]PARTMENT Counselor from 05/23/2023 in [facility]  Total GAD-7 Score 17 15 13 20    Mini-Mental    Flowsheet Row Office Visit from 03/01/2022 in [facility]  Total Score (max 30 points ) 10   PHQ2-9    Flowsheet Row Office Visit from 03/05/2024 in [facility] Office Visit from 01/08/2024 in [facility] Counselor from 12/19/2023 in [facility] at [facility] from 12/04/2023 in [facility] at [facility] Visit from 10/02/2023 in [facility] Medicine and Rehabilitation  PHQ-2 Total Score 0 0 6 6 2   PHQ-9 Total Score -- -- 21 14 --   Flowsheet Row ED from 03/13/2024 in [facility] Admission (Discharged) from 03/09/2024 in [facility] CARDIAC CATH LAB ED to Hosp-Admission (Discharged) from 02/23/2024 in [place] CV PROGRESSIVE CARE  C-SSRS RISK CATEGORY No Risk No Risk No Risk     Assessment and Plan: Patient is 48 year old female with history of anoxic brain injury, hypertension, memory problem, sleep apnea, cannabis use, major depressive disorder, mood lability and neurocognitive symptoms.  I review blood work results, collect information from emergency room visits, other providers and current medication.  She is on Seroquel  150 mg from her primary care Dr. Greig Cluster, Lamictal  100 mg twice a day and Zoloft  increased to 150 by Dr. Arlana.  I did recommend she did see 1 provider for psychotropic medication.  All these medications are recently filled.  I have offered therapy and patient had a 1 visit with [place] but she did not connect and liked.  I will forward my note to her provider.  Patient gets sensitive about her chronic health issues when asked.  She is not sure when she will get knee surgery and she is going to ask from her rehab doctor.  Will follow-up in 4 months.  Patient will decided if she like to keep 1 provider for psychotropic medication.  I encourage call back if she feels symptoms are worsening.  I also recommend to call back if she is interested in therapy.  anoxic brain injury, status postcardiac arrest Collaboration of Care: Collaboration of Care: Other provider involved in patient's care AEB notes are available in epic to review  Patient/Guardian was advised Release of Information must be obtained prior to any record release in order to collaborate their care with an  outside provider. Patient/Guardian was advised if they have not already done so to contact the registration department to sign all necessary forms in order for us  to release information regarding their care.   Consent: Patient/Guardian gives verbal consent for treatment and assignment of benefits for services provided during this visit. Patient/Guardian expressed understanding and agreed to proceed.    Leni ONEIDA Client, MD 03/19/2024, 11:28 AM

## 2024-03-20 ENCOUNTER — Other Ambulatory Visit (HOSPITAL_COMMUNITY): Payer: Self-pay | Admitting: Psychiatry

## 2024-03-20 ENCOUNTER — Telehealth: Payer: Self-pay

## 2024-03-20 DIAGNOSIS — F321 Major depressive disorder, single episode, moderate: Secondary | ICD-10-CM

## 2024-03-20 DIAGNOSIS — G479 Sleep disorder, unspecified: Secondary | ICD-10-CM

## 2024-03-20 DIAGNOSIS — R4586 Emotional lability: Secondary | ICD-10-CM

## 2024-03-20 DIAGNOSIS — G931 Anoxic brain damage, not elsewhere classified: Secondary | ICD-10-CM

## 2024-03-20 NOTE — Telephone Encounter (Signed)
 Sure we can submit for visco

## 2024-03-20 NOTE — Telephone Encounter (Signed)
 Patient hasn't been able to get TKA scheduled.  Is asking about possibly getting gel injections.  Please call.

## 2024-03-23 ENCOUNTER — Other Ambulatory Visit: Payer: Self-pay

## 2024-03-23 DIAGNOSIS — M1712 Unilateral primary osteoarthritis, left knee: Secondary | ICD-10-CM

## 2024-03-23 NOTE — Telephone Encounter (Signed)
 Referral placed for gel injection in left knee. Patient notified.

## 2024-03-24 ENCOUNTER — Ambulatory Visit: Admitting: Student

## 2024-03-27 ENCOUNTER — Ambulatory Visit: Admitting: Physician Assistant

## 2024-03-30 ENCOUNTER — Other Ambulatory Visit: Payer: Self-pay

## 2024-03-31 ENCOUNTER — Ambulatory Visit: Admitting: Pulmonary Disease

## 2024-03-31 ENCOUNTER — Other Ambulatory Visit (HOSPITAL_COMMUNITY): Payer: Self-pay

## 2024-04-01 ENCOUNTER — Ambulatory Visit: Admitting: Physical Medicine & Rehabilitation

## 2024-04-06 ENCOUNTER — Encounter (HOSPITAL_COMMUNITY): Payer: Self-pay | Admitting: Internal Medicine

## 2024-04-06 ENCOUNTER — Ambulatory Visit (HOSPITAL_COMMUNITY)
Admission: RE | Admit: 2024-04-06 | Discharge: 2024-04-06 | Disposition: A | Discharge status: Home / Self Care | Source: Ambulatory Visit | Attending: Internal Medicine | Admitting: Internal Medicine

## 2024-04-06 VITALS — BP 124/80 | HR 70 | Ht 66.0 in | Wt 201.4 lb

## 2024-04-06 DIAGNOSIS — Z5181 Encounter for therapeutic drug level monitoring: Secondary | ICD-10-CM | POA: Diagnosis not present

## 2024-04-06 DIAGNOSIS — I480 Paroxysmal atrial fibrillation: Secondary | ICD-10-CM

## 2024-04-06 DIAGNOSIS — D6869 Other thrombophilia: Secondary | ICD-10-CM | POA: Diagnosis not present

## 2024-04-06 DIAGNOSIS — Z79899 Other long term (current) drug therapy: Secondary | ICD-10-CM

## 2024-04-06 NOTE — Progress Notes (Addendum)
 "   Primary Care Physician: [name] BRAVO, NP Primary Cardiologist: Candyce Reek, MD Electrophysiologist: Will Gladis Norton, MD     Referring Physician: Dr. Almetta Lennette [name] Courtney Davies is a 48 y.o. female with a history of VF arrest (11/2020) s/p secondary to prevention BSCI S-ICD, paroxysmal A-fib with RVR, recurrent MM VT, NICM, CVA, anoxic brain injury, mood disorder, HTN, and T2DM who presents for consultation in the [facility].  Patient is on Eliquis  for stroke prevention.  On evaluation today, patient is currently in NSR. S/p Afib ablation on 03/09/2024 by Dr. Almetta. No episodes of Afib since ablation.  She is taking amiodarone  200 mg once daily.  No chest pain or SOB. Leg sites healed without issue. No missed doses of anticoagulant.  Today, she denies symptoms of orthopnea, PND, lower extremity edema, dizziness, presyncope, syncope, snoring, daytime somnolence, bleeding, or neurologic sequela. The patient is tolerating medications without difficulties and is otherwise without complaint today.    she has a BMI of Body mass index is 32.51 kg/m.SABRA Filed Weights   04/06/24 1512  Weight: 91.4 kg    Current Outpatient Medications  Medication Sig Dispense Refill   Accu-Chek Softclix Lancets lancets Check blood sugars once daily Dx code E10.9 100 each 12   acetaminophen  (TYLENOL ) 325 MG tablet Take 2 tablets (650 mg total) by mouth every 6 (six) hours as needed for headache or mild pain (pain score 1-3).     amiodarone  (PACERONE ) 200 MG tablet Take 2 tablets (400 mg total) by mouth daily. 60 tablet 1   apixaban  (ELIQUIS ) 5 MG TABS tablet Take 1 tablet (5 mg total) by mouth 2 (two) times daily. 60 tablet 5   BD PEN NEEDLE NANO 2ND GEN 32G X 4 MM MISC 3 (three) times daily. as directed     Blood Glucose Monitoring Suppl (ACCU-CHEK GUIDE ME) w/Device KIT 1 kit by Does not apply route as directed. Check blood sugars once daily Dx code E11.65 1 kit 0    gabapentin  (NEURONTIN ) 100 MG capsule Take 100 mg by mouth 2 (two) times daily.     lamoTRIgine  (LAMICTAL ) 100 MG tablet TAKE 1 TABLET BY MOUTH TWICE DAILY 60 tablet 5   linaclotide  (LINZESS ) 145 MCG CAPS capsule Take 145 mcg by mouth daily as needed (constipation).     losartan  (COZAAR ) 50 MG tablet Take 1 tablet (50 mg total) by mouth daily. 30 tablet 1   omeprazole (PRILOSEC) 40 MG capsule Take 40 mg by mouth in the morning and at bedtime.     propranolol  (INDERAL ) 40 MG tablet Take 1 tablet (40 mg total) by mouth 2 (two) times daily. 60 tablet 1   QUEtiapine  (SEROQUEL ) 100 MG tablet Take 100 mg by mouth at bedtime.     rosuvastatin  (CRESTOR ) 20 MG tablet Take 1.5 tablets (30 mg total) by mouth daily. 135 tablet 3   sertraline  (ZOLOFT ) 100 MG tablet Take 1.5 tablets (150 mg total) by mouth daily. 45 tablet 4   No current facility-administered medications for this encounter.    Atrial Fibrillation Management history:  Previous antiarrhythmic drugs: Amiodarone  Previous cardioversions: none Previous ablations: 03/09/2024 Anticoagulation history: Eliquis    ROS- All systems are reviewed and negative except as per the HPI above.  Physical Exam: BP 124/80   Pulse 70   Ht 5' 6 (1.676 m)   Wt 91.4 kg   LMP 12/03/2012   BMI 32.51 kg/m   GEN: Well nourished, well developed  in no acute distress NECK: No JVD; No carotid bruits CARDIAC: Regular rate and rhythm, no murmurs, rubs, gallops RESPIRATORY:  Clear to auscultation without rales, wheezing or rhonchi  ABDOMEN: Soft, non-tender, non-distended EXTREMITIES:  No edema; No deformity   EKG today demonstrates  EKG Interpretation Date/Time:  Monday April 06 2024 15:16:46 EST Ventricular Rate:  70 PR Interval:  154 QRS Duration:  82 QT Interval:  412 QTC Calculation: 444 R Axis:   68  Text Interpretation: Normal sinus rhythm Septal infarct , age undetermined Abnormal ECG When compared with ECG of 13-Mar-2024 13:55, PREVIOUS ECG  IS PRESENT Confirmed by Terra Pac ([id number]) on 04/06/2024 3:17:50 PM    Echo 02/24/24 demonstrated:   1. Left ventricular ejection fraction, by estimation, is 55 to 60%. The  left ventricle has normal function. The left ventricle has no regional  wall motion abnormalities. Left ventricular diastolic parameters were  normal.   2. Right ventricular systolic function is normal. The right ventricular  size is normal. Tricuspid regurgitation signal is inadequate for assessing  PA pressure.   3. The mitral valve is degenerative. Mild to moderate mitral valve  regurgitation. No evidence of mitral stenosis.   4. The aortic valve is tricuspid. Aortic valve regurgitation is not  visualized. Aortic valve sclerosis/calcification is present, without any  evidence of aortic stenosis.   5. The inferior vena cava is normal in size with greater than 50%  respiratory variability, suggesting right atrial pressure of 3 mmHg.   ASSESSMENT & PLAN CHA2DS2-VASc Score = 5  The patient's score is based upon: CHF History: 0 HTN History: 1 Diabetes History: 1 Stroke History: 2 Vascular Disease History: 0 Age Score: 0 Gender Score: 1       ASSESSMENT AND PLAN: Paroxysmal Atrial Fibrillation (ICD10:  I48.19) The patient's CHA2DS2-VASc score is 5, indicating a 7.2% annual risk of stroke.   S/p A-fib ablation on 03/05/2024 by Dr. Almetta.  Patient is currently in NSR.  We discussed what to expect in the recovery period following ablation.  Continue propranolol  40 mg twice daily.  High risk medication monitoring (ICD10: [id number]) Patient requires ongoing monitoring for anti-arrhythmic medication which has the potential to cause life threatening arrhythmias or AV block. Qtc stable. Continue amiodarone  200 mg daily.   Secondary Hypercoagulable State (ICD10:  D68.69) The patient is at significant risk for stroke/thromboembolism based upon her CHA2DS2-VASc Score of 5.  Continue Apixaban  (Eliquis ).   Continue Eliquis  5 mg twice daily without interruption in the recovery period.     Follow up with EP as scheduled.   Terra Pac, [facility]  Afib Clinic [address] [place], [place] [address] [phone]   "

## 2024-04-13 ENCOUNTER — Telehealth: Payer: Self-pay | Admitting: Cardiology

## 2024-04-13 NOTE — Telephone Encounter (Signed)
 Nobody in device called pt

## 2024-04-13 NOTE — Telephone Encounter (Signed)
 Does not look like a device nurse call. Sending to CMA to check.

## 2024-04-13 NOTE — Telephone Encounter (Signed)
 Pt states she received a call from the device clinic

## 2024-04-17 ENCOUNTER — Encounter

## 2024-04-23 ENCOUNTER — Encounter

## 2024-04-23 DIAGNOSIS — I480 Paroxysmal atrial fibrillation: Secondary | ICD-10-CM

## 2024-04-29 ENCOUNTER — Ambulatory Visit: Payer: Self-pay | Admitting: Cardiology

## 2024-04-29 LAB — CUP PACEART REMOTE DEVICE CHECK
Battery Remaining Percentage: 64 %
Battery Voltage: 64
Date Time Interrogation Session: 20260113143200
HighPow Impedance: 65 Ohm
Implantable Lead Connection Status: 753985
Implantable Lead Implant Date: 20230531
Implantable Lead Location: 753862
Implantable Lead Model: 3501
Implantable Lead Serial Number: 235497
Implantable Pulse Generator Implant Date: 20230531
Pulse Gen Serial Number: 177788

## 2024-04-29 NOTE — Progress Notes (Signed)
 Remote ICD Transmission

## 2024-05-04 ENCOUNTER — Encounter: Attending: Physical Medicine & Rehabilitation | Admitting: Psychology

## 2024-05-04 DIAGNOSIS — G931 Anoxic brain damage, not elsewhere classified: Secondary | ICD-10-CM | POA: Insufficient documentation

## 2024-05-04 DIAGNOSIS — F321 Major depressive disorder, single episode, moderate: Secondary | ICD-10-CM | POA: Insufficient documentation

## 2024-05-04 DIAGNOSIS — G479 Sleep disorder, unspecified: Secondary | ICD-10-CM | POA: Insufficient documentation

## 2024-05-04 DIAGNOSIS — R413 Other amnesia: Secondary | ICD-10-CM | POA: Insufficient documentation

## 2024-05-04 DIAGNOSIS — R4586 Emotional lability: Secondary | ICD-10-CM | POA: Insufficient documentation

## 2024-05-04 NOTE — Progress Notes (Incomplete)
 "  NEUROPSYCHOLOGICAL EVALUATION [place]. [facility]  Physical Medicine and Rehabilitation     Patient: Courtney Davies  DOB: Dec 13, 1975  Age: 49 y.o. Sex: female  Race/Ethnicity: Black or African American *** Years of Ed.: ***  Collateral Source: ***  Referring Provider: [name] BRAVO, NP Provider / Neuropsychologist: Evalene DOROTHA Riff, PsyD  Date of Service: @DOS @ Start: *** End: *** Location:  [place] Physical Medicine & Rehabilitation Department [address], [address], [place], [place] [address] Billing Code/Service: [phone] (1 Unit), [phone] (1 Unit) 1 hour and 15 minutes spent in face-to-face clinical interview and remaining 45 minutes was spent in record review, documentation, and testing protocol construction.   Individuals Present: The patient was seen by the provider, in-person, in the provider's outpatient office. The patient was ***  PATIENT CONSENT AND CONFIDENTIALITY The patient's understanding of the reason for referral was intact. Discussed limits of confidentiality including, but not limited to, posting of final evaluation report in the patient's electronic medical record for both the patient and for the referring provider and appropriate medical professionals. Patient was given the opportunity to have their questions answered. The neuropsychological evaluation process was discussed with the patient and they consented to proceed with the evaluation.  Consent for Evaluation and Treatment: Signed: Yes Explanation of Privacy Policies: Signed: Yes Discussion of Confidentiality Limits: Yes  REASON FOR REFERRAL & RECORD REVIEW The patient was referred for neuropsychological evaluation by ***   Upon interview, the patient ***  Feel slike doing a little better with memory. When cooking something. Doing a little bit better.   Live with her sons, ages 59 and 1.   ABI was 2022. No cognitive issues before then. Was in school  to get CDL License.   [place], raised by mother. Two older one younger.   Some behavioral issues in school. Dropped out 9th or 10th. Got HS diploma.   Got HS diploma before went into cardiac arrest. Got CDL permit and then heart attack.   Wants to work.   Not sure what her first job was. Drove for day care.   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   Worked research officer, trade union.   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 That was after injury.   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    Father is now in life more since cardiac arrest.   HISTORY OF PRESENTING CONCERNS:  Cognitive Symptom Onset & Course:     Current Cognitive Complaints:  Memory:  Patient: Short term memory dificulties.  Processing Speed:  Patient: Does things too fast.  Attention & Concentration: Patient: Difficulty. Jumping task to task.  Language:  Patient: Trouble with word finding. No problems understanding others.  Visual-Spatial:  Patient: Drives. Less reliance on GPS now relative to earlier in the injury.  Executive Functioning:  Patient: [place] to solve problem. Impulsivity. Trouble holding tongue when getting upset.   Motor/Sensory Complaints:  Sensory changes: Hearing good. Vision, needs glasses but isn't able to get replacements until two months from now. Not noticing changes sense of smell or taste.  Balance/coordination difficulties: Often has trouble with balance, but says it has improved. Needing to slow down walking is helpful.  Frequent instances of dizziness/vertigo: Not an issue.  Other motor difficulties: Coordination has changed, hard to control.   Emotional and Behavioral Functioning:  [facility] - psychiatry.   Was previously seeing Lynwood Maris for counseling.  Depression Symptomatology: SI, its getting a little better, but still just a little bit.   No toruble before the injury.  Not sure PTSD.   Sad frequently, less enjoyment, sleep  difficulties are somewhat better but rumination impacts things. Energy is okay.   Some questioning about mortality. No attempts since then.   When cope, I sit down on my couch. Staying away from people, and doesn't want to take it out on someone else.   Anxiety Symptomatology:  Worry a lot, feels tense frequently, trouble relaxing, think maybe one time after the hosptial.   Other Symptomatology: Seeing/hearing things not there? Thinking something hitting the wall or something. Hearing noise, something hitting the wall or something. Nothing like a voice. People aren't talking to the TV. I can hear the knocking all the time.   No clear indication of history of manic episode.   On meds from psychiatrist and [facility].   Sleep: Sometimes six ours per night. Some trouble falling and some trouble returning to sleep. Frequent nightmares, random content.  Appetite: Appetite went up a little bit since stopping smoking.  Caffeine: No significant amounts of soda.   Alcohol Use: No alcohol. Used to drink. Never daily drinking.  Tobacco Use: Smoked up until went into cardiac arrest.  Recreational Substance Use: Stopped smoking weed.    Academic/Vocational History: ***  Psychosocial: Marital Status: No   Children/Grandchildren: 2 boys.    Living Situation: Lives in her home with her two boys.    Daily Activities/Hobbies: Doesn't do much for fun. Watching TV? I don't understand it. Hard to track the new stuff when watching it.     SSI is primary source income.   Level of Functional Independence: The patient is *** with basic activities of daily living.  Finances: Sons pay the bills. Send son money for the phone and pay car insurance immediately as soon as get the money, because if doesn't will lose track.    Shopping / Meal Preparation: Buying things again by accident.    Household Maintenance / Chores: Little something  started on a bunch of things and never finish.  Tracking Appointments /  Future Obligations: Keep track of appoitnments by going through mychart.    Medication Management: Takes meds at 8am and 8pm. Mom helps organize the meds, she helps fix the meds.  Driving: Couple months ago.     Medical History/Record Review: Per records and patient report, History of traumatic brain injury/concussion: Not sure.    History of stroke: No stroke.    History of heart attack: *** History of cancer/chemotherapy: No.    History of seizure activity: No   Symptoms of chronic pain: Knee pain.    Experience of frequent headaches/migraines: Headaches frequently. Headaches every day.     Imaging/Lab Results: *** Past Medical History:  Diagnosis Date   AICD (automatic cardioverter/defibrillator) present    [employer] ICD   Anemia 09/2015   Anoxic brain injury (HCC)    Arthritis    knees (04/23/2017)   Asthma    teens; went away; came back (04/23/2017)   B12 deficiency anemia 04/23/2017   Cardiac arrest (HCC)    Chronic bronchitis (HCC)    Chronic low back pain with sciatica    Chronic lower back pain    Diabetes mellitus without complication (HCC)    Diabetes type 2, controlled (HCC)    Elevated ferritin level    Fatty liver    GERD (gastroesophageal reflux disease)    GERD with  stricture    Headache    1-2/wk (04/23/2017)   History of blood transfusion plenty   related to anemia (04/23/2017)   Hypertension    Inguinal hernia    Low back pain    Migraine    1-2/month (04/23/2017)   Nausea and vomiting 03/01/2024   OA (osteoarthritis) of knee--left    Paroxysmal atrial fibrillation (HCC)    Sub-clinical -seen on device?, elevated CHADSVASC   Symptomatic anemia 04/23/2017   Vitamin B 12 deficiency    Patient Active Problem List   Diagnosis Date Noted   Nausea and vomiting 03/01/2024   Constipation 03/01/2024   PAF (paroxysmal atrial fibrillation) (HCC) 03/01/2024   History of gastritis 03/01/2024   Controlled type 2 diabetes mellitus without  complication, without long-term current use of insulin  (HCC) 03/01/2024   History of anoxic brain injury 03/01/2024   VT (ventricular tachycardia) (HCC) 02/29/2024   ICD (implantable cardioverter-defibrillator) discharge 02/23/2024   Sleep disorder 06/05/2023   Neurocognitive deficits 05/22/2023   Cannabis use disorder 05/22/2023   Stroke (HCC) 01/08/2023   Mild intermittent asthma without complication 02/09/2022   Vitamin B12 deficiency 02/09/2022   MDD (major depressive disorder), single episode, moderate (HCC) 11/08/2021   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   Controlled type 2 diabetes mellitus with hyperglycemia (HCC)    Essential hypertension    Anoxic brain injury (HCC) 01/02/2021   Abdominal wall cellulitis 12/20/2020   Dysphagia    Goals of care, counseling/discussion    Acute systolic heart failure (HCC)    Encephalopathy acute    Type 1 diabetes mellitus without complication 12/02/2020   Hypertension 12/02/2020   GERD (gastroesophageal reflux disease) 12/02/2020   Anemia 12/02/2020   Cardiac arrest (HCC) 12/01/2020   Unilateral primary osteoarthritis, left knee 12/01/2018   Lateral meniscus, posterior horn derangement, left 05/14/2018   Status post arthroscopy of left knee 05/14/2018   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/19/2017   Anemia 04/23/2017   Acute bronchitis 04/23/2017   Chronic back pain 10/03/2015   Symptomatic anemia 04/10/2014   Bilateral edema of lower extremity    Low back pain with left-sided sciatica    Family Neurologic/Medical Hx:  Family History  Problem Relation Age of Onset   Stroke Mother    Diabetes Mother    Diabetes Father    Stroke Maternal Grandmother    Diabetes Maternal Grandmother    Anemia Paternal Grandmother    Valvular heart disease Paternal Grandmother    Hypertension Mother    Prostate cancer Maternal Uncle        ? intestinal also    Medications: ***      Not currently working...  doing a program to help her work...  Path to success.   Mental Status/Behavioral Observations: The patient was seen on an outpatient basis in the [facility] office for the clinical interview *** Sensorium/Arousal: ***   Orientation: ***   Appearance: ***   Behavior: ***   Speech/Language: ***   Motor: ***   Social Comportment: ***   Mood: ***   Affect: ***   Thought Process/Content: ***   Ability to Participate in Interview: ***   Insight: ***    SUMMARY / CLINICAL IMPRESSIONS ***  DISPOSITION / PLAN The patient has been set up for a formal neuropsychological assessment to objectively assess her cognitive functioning across domains to establish the patient's cognitive profile. This data, in conjunction with information obtained via clinical interview and medical record review, will help  clarify likely etiology and guide treatment recommendations. Once data collection and interpretation have been completed, the findings / diagnosis and recommendations will be reviewed and discussed with the patient during a feedback appointment with the neuropsychologist. Based on the collaborative dialogue with the patient during the feedback, recommendations may be adjusted / tailored as needed. A formal report will be produced and provided to the patient and the referring provider.   Diagnosis:    FULL REPORT TO FOLLOW   Evalene DOROTHA Riff, PsyD [facility]psych[facility] [address] [place], [place] [address] Main: [phone] Fax: [phone] [place] License # [id number]  This report was generated using voice recognition software. While this document has been carefully reviewed, transcription errors may be present. I apologize in advance for any inconvenience. Please contact me if further clarification is needed.  "

## 2024-05-08 ENCOUNTER — Other Ambulatory Visit: Payer: Self-pay | Admitting: Orthopedic Surgery

## 2024-05-08 ENCOUNTER — Other Ambulatory Visit (HOSPITAL_COMMUNITY): Payer: Self-pay

## 2024-05-09 ENCOUNTER — Other Ambulatory Visit (HOSPITAL_COMMUNITY): Payer: Self-pay

## 2024-05-12 ENCOUNTER — Encounter (HOSPITAL_COMMUNITY): Payer: Self-pay

## 2024-05-12 ENCOUNTER — Other Ambulatory Visit (HOSPITAL_COMMUNITY): Payer: Self-pay

## 2024-05-12 ENCOUNTER — Encounter

## 2024-05-14 ENCOUNTER — Encounter: Admitting: Psychology

## 2024-05-15 ENCOUNTER — Ambulatory Visit: Payer: Self-pay

## 2024-05-15 NOTE — Telephone Encounter (Signed)
 Appointment scheduled for 05/21/2024 by E2C2

## 2024-05-15 NOTE — Telephone Encounter (Signed)
 FYI Only or Action Required?: FYI only for provider: appointment scheduled on 2/5.  Patient was last seen in primary care on 03/05/2024 by [name] BRAVO, NP.  Called Nurse Triage reporting [place].  Symptoms began several months ago.  Interventions attempted: Nothing.  Symptoms are: stable.  Triage Disposition: See PCP Within 2 Weeks  Patient/caregiver understands and will follow disposition?: Yes       Reason for Triage: Patient states she has knots under her arms, in the back of her head, neck, and etc. Wants to see Amy [name].  Reason for Disposition  [1] Normal-sized node (i.e., < 1 cm, < 1/2 in) AND [2] present > 2 weeks AND [3] patient is worried about cancer  Answer Assessment - Initial Assessment Questions 1. LOCATION: Where is the swollen node located? Is the matching node on the other side of the body also swollen?      R arm (painful), R side of neck (non tender) back of my head   2. SIZE: How big is the node? (e.g., inches or centimeters; or compared to common objects such as pea, bean, marble, golf ball)      It's small I think   3. ONSET: When did the swelling start?      Approx a few months ago  4. NECK NODES: Is there a sore throat, runny nose or other symptoms of a cold?      Denies  5. GROIN OR ARMPIT NODES: Is there a sore, scratch, cut or painful red area on that arm or leg?      Has had these in the past, which were biopsied and found to be benign. Not at this time  6. FEVER: Do you have a fever? If Yes, ask: What is it, how was it measured, and when did it start?      Denies  7. CAUSE: What do you think is causing the swollen lymph nodes?     Pt unsure  8. OTHER SYMPTOMS: Do you have any other symptoms? (e.g., node is tender to touch, skin redness over node, weight changes)     Denies  Protocols used: Lymph Nodes - Swollen-A-AH

## 2024-05-19 ENCOUNTER — Encounter

## 2024-05-21 ENCOUNTER — Ambulatory Visit: Admitting: Orthopedic Surgery

## 2024-05-21 ENCOUNTER — Encounter: Payer: Self-pay | Admitting: Orthopedic Surgery

## 2024-05-21 VITALS — BP 148/82 | Temp 98.0°F | Ht 66.0 in | Wt 203.2 lb

## 2024-05-21 DIAGNOSIS — I10 Essential (primary) hypertension: Secondary | ICD-10-CM

## 2024-05-21 DIAGNOSIS — Z95810 Presence of automatic (implantable) cardiac defibrillator: Secondary | ICD-10-CM

## 2024-05-21 DIAGNOSIS — Z7985 Long-term (current) use of injectable non-insulin antidiabetic drugs: Secondary | ICD-10-CM

## 2024-05-21 DIAGNOSIS — G8929 Other chronic pain: Secondary | ICD-10-CM

## 2024-05-21 DIAGNOSIS — I480 Paroxysmal atrial fibrillation: Secondary | ICD-10-CM

## 2024-05-21 DIAGNOSIS — E1159 Type 2 diabetes mellitus with other circulatory complications: Secondary | ICD-10-CM

## 2024-05-21 DIAGNOSIS — L732 Hidradenitis suppurativa: Secondary | ICD-10-CM

## 2024-05-21 DIAGNOSIS — M25562 Pain in left knee: Secondary | ICD-10-CM

## 2024-05-21 MED ORDER — DOXYCYCLINE HYCLATE 100 MG PO TABS: 100.0000 mg | ORAL_TABLET | Freq: Two times a day (BID) | ORAL | 0 refills | Status: AC

## 2024-05-21 MED ORDER — AMIODARONE HCL 200 MG PO TABS: 400.0000 mg | ORAL_TABLET | Freq: Every day | ORAL | 2 refills | Status: AC

## 2024-05-21 MED ORDER — PROPRANOLOL HCL 40 MG PO TABS: 40.0000 mg | ORAL_TABLET | Freq: Two times a day (BID) | ORAL | 2 refills | Status: AC

## 2024-05-21 MED ORDER — LOSARTAN POTASSIUM 50 MG PO TABS: 50.0000 mg | ORAL_TABLET | Freq: Every day | ORAL | 2 refills | Status: AC

## 2024-05-21 NOTE — Progress Notes (Addendum)
 "   Careteam: Patient Care Team: Courtney Greig BRAVO, NP as PCP - General (Adult Health Nurse Practitioner) Courtney Candyce RAMAN, MD as PCP - Cardiology (Cardiology) Courtney Soyla Lunger, MD as PCP - Electrophysiology (Cardiology) Boneta, [name], PA-C (Family Medicine) Gordy Channing LABOR, RN as Registered Nurse  Seen by: Greig Courtney, AGNP-C  PLACE OF SERVICE:  [facility]  Advanced Directive information    Allergies[1]  Chief Complaint  Patient presents with   Acute Visit    Knots under her right underarms and behind neck and groin, afraid of it being cancerous     HPI: Patient is a 49 y.o. female seen today for acute visit due to painful nodules under axilla.   Discussed the use of AI scribe software for clinical note transcription with the patient, who gave verbal consent to proceed.  History of Present Illness    She has experienced swollen lymph nodes for a couple of weeks, with a new one on her right neck appearing a few days ago. The nodes are tender with touch, particularly the one on her neck.   Followed by endocrinology. She has diabetes and is currently on Ozempic, administering a 2.5 mg dose weekly. Her weight has remained stable at 203 lbs over the past year, though she notes a previous weight of 187 lbs. Heaviest weight was around 260 lbs. Her diet includes snacks like popcorn and graham crackers, and meals of baked chicken and vegetables such as green beans and cabbage.  No numbness, tingling, or open sores on her feet, though she mentions having an ingrown toenail that causes discomfort when wearing socks. She used to get her toenails done but is cautious due to her diabetes.   She has a history of chronic knee pain. She has cancelled upcoming knee replacement and plans to have gel injections instead. No recent fall or injury. She is not taking anything for pain.    She has a history of hypertension and is currently not taking her prescribed medications, including  losartan , propranolol  and amiodarone .   She has a defibrillator and mentions a previous heart surgery. She has experienced a massive heart attack in the past.     Review of Systems:  Review of Systems  Constitutional: Negative.   HENT: Negative.    Eyes: Negative.   Respiratory: Negative.    Cardiovascular: Negative.   Gastrointestinal: Negative.   Genitourinary: Negative.   Musculoskeletal: Negative.   Skin:        Painful nodules   Neurological: Negative.   Endo/Heme/Allergies: Negative.   Psychiatric/Behavioral:  Positive for depression.     Past Medical History:  Diagnosis Date   AICD (automatic cardioverter/defibrillator) present    [employer] ICD   Anemia 09/2015   Anoxic brain injury (HCC)    Arthritis    knees (04/23/2017)   Asthma    teens; went away; came back (04/23/2017)   B12 deficiency anemia 04/23/2017   Cardiac arrest (HCC)    Chronic bronchitis (HCC)    Chronic low back pain with sciatica    Chronic lower back pain    Diabetes mellitus without complication (HCC)    Diabetes type 2, controlled (HCC)    Elevated ferritin level    Fatty liver    GERD (gastroesophageal reflux disease)    GERD with stricture    Headache    1-2/wk (04/23/2017)   History of blood transfusion plenty   related to anemia (04/23/2017)   Hypertension    Inguinal hernia  Low back pain    Migraine    1-2/month (04/23/2017)   Nausea and vomiting 03/01/2024   OA (osteoarthritis) of knee--left    Paroxysmal atrial fibrillation (HCC)    Sub-clinical -seen on device?, elevated CHADSVASC   Symptomatic anemia 04/23/2017   Vitamin B 12 deficiency    Past Surgical History:  Procedure Laterality Date   ABDOMINAL HYSTERECTOMY  YRS AGO   COMPLETE   ATRIAL FIBRILLATION ABLATION N/A 03/09/2024   Procedure: ATRIAL FIBRILLATION ABLATION;  Surgeon: Almetta Donnice LABOR, MD;  Location: [facility] INVASIVE CV LAB;  Service: Cardiovascular;  Laterality: N/A;   CESAREAN SECTION  1998;  2002   ESOPHAGEAL DILATION  02/2020   by Dr Marvis   INGUINAL HERNIA REPAIR Bilateral 1980s    total of 4 surgeries (04/23/2017)   IR GASTROSTOMY TUBE MOD SED  12/13/2020   KNEE ARTHROSCOPY WITH MEDIAL MENISECTOMY Left 01/30/2018   Procedure: LEFT KNEE ARTHROSCOPY WITH PARTIAL LATERAL MENISCECTOMY;  Surgeon: Vernetta Lonni GRADE, MD;  Location: [facility] ORS;  Service: Orthopedics;  Laterality: Left;   KNEE CARTILAGE SURGERY Left    SUBQ ICD IMPLANT N/A 09/13/2021   Procedure: SUBQ ICD IMPLANT;  Surgeon: Courtney Soyla Lunger, MD;  Location: [facility] INVASIVE CV LAB;  Service: Cardiovascular;  Laterality: N/A;   TUBAL LIGATION  2002   Social History:   reports that she has quit smoking. Her smoking use included cigarettes. She has never used smokeless tobacco. She reports that she does not currently use alcohol. She reports current drug use. Drug: Marijuana.  Family History  Problem Relation Age of Onset   Stroke Mother    Diabetes Mother    Diabetes Father    Stroke Maternal Grandmother    Diabetes Maternal Grandmother    Anemia Paternal Grandmother    Valvular heart disease Paternal Grandmother    Hypertension Mother    Prostate cancer Maternal Uncle        ? intestinal also    Medications: Patient's Medications  New Prescriptions   DOXYCYCLINE  (VIBRA -TABS) 100 MG TABLET    Take 1 tablet (100 mg total) by mouth 2 (two) times daily for 14 days.  Previous Medications   ACCU-CHEK SOFTCLIX LANCETS LANCETS    Check blood sugars once daily Dx code E10.9   ACETAMINOPHEN  (TYLENOL ) 325 MG TABLET    Take 2 tablets (650 mg total) by mouth every 6 (six) hours as needed for headache or mild pain (pain score 1-3).   APIXABAN  (ELIQUIS ) 5 MG TABS TABLET    Take 1 tablet (5 mg total) by mouth 2 (two) times daily.   BD PEN NEEDLE NANO 2ND GEN 32G X 4 MM MISC    3 (three) times daily. as directed   BLOOD GLUCOSE MONITORING SUPPL (ACCU-CHEK GUIDE ME) W/DEVICE KIT    1 kit by Does not apply route as  directed. Check blood sugars once daily Dx code E11.65   GABAPENTIN  (NEURONTIN ) 100 MG CAPSULE    Take 100 mg by mouth 2 (two) times daily.   LAMOTRIGINE  (LAMICTAL ) 100 MG TABLET    TAKE 1 TABLET BY MOUTH TWICE DAILY   LINACLOTIDE  (LINZESS ) 145 MCG CAPS CAPSULE    Take 145 mcg by mouth daily as needed (constipation).   OMEPRAZOLE (PRILOSEC) 40 MG CAPSULE    Take 40 mg by mouth in the morning and at bedtime.   QUETIAPINE  (SEROQUEL ) 100 MG TABLET    Take 100 mg by mouth at bedtime.   ROSUVASTATIN  (CRESTOR ) 20 MG TABLET  Take 1.5 tablets (30 mg total) by mouth daily.   SEMAGLUTIDE (OZEMPIC, 2 MG/DOSE, [place])    Inject 2.5 mg into the skin once a week.   SERTRALINE  (ZOLOFT ) 100 MG TABLET    Take 1.5 tablets (150 mg total) by mouth daily.  Modified Medications   Modified Medication Previous Medication   AMIODARONE  (PACERONE ) 200 MG TABLET amiodarone  (PACERONE ) 200 MG tablet      Take 2 tablets (400 mg total) by mouth daily.    Take 2 tablets (400 mg total) by mouth daily.   LOSARTAN  (COZAAR ) 50 MG TABLET losartan  (COZAAR ) 50 MG tablet      Take 1 tablet (50 mg total) by mouth daily.    Take 1 tablet (50 mg total) by mouth daily.   PROPRANOLOL  (INDERAL ) 40 MG TABLET propranolol  (INDERAL ) 40 MG tablet      Take 1 tablet (40 mg total) by mouth 2 (two) times daily.    Take 1 tablet (40 mg total) by mouth 2 (two) times daily.  Discontinued Medications   No medications on file    Physical Exam:  Vitals:   05/21/24 1401 05/21/24 1402 05/21/24 1431  BP: (!) 172/100 (!) 164/100 (!) 148/82  Temp: 98 F (36.7 C)    SpO2: 99%    Weight: 203 lb 3.2 oz (92.2 kg)    Height: 5' 6 (1.676 m)     Body mass index is 32.8 kg/m. Wt Readings from Last 3 Encounters:  05/21/24 203 lb 3.2 oz (92.2 kg)  04/06/24 201 lb 6.4 oz (91.4 kg)  03/18/24 201 lb 12.8 oz (91.5 kg)    Physical Exam Vitals reviewed.  Constitutional:      General: She is not in acute distress. HENT:     Head: Normocephalic.  Eyes:      General:        Right eye: No discharge.        Left eye: No discharge.  Cardiovascular:     Rate and Rhythm: Normal rate and regular rhythm.     Pulses: Normal pulses.     Heart sounds: Normal heart sounds.  Pulmonary:     Effort: Pulmonary effort is normal.     Breath sounds: Normal breath sounds.  Abdominal:     General: Bowel sounds are normal.     Palpations: Abdomen is soft.  Musculoskeletal:     Cervical back: Neck supple.     Right lower leg: No edema.     Left lower leg: No edema.  Skin:    General: Skin is warm.     Capillary Refill: Capillary refill takes less than 2 seconds.     Comments: Small pea-sized nodules under axilla, tender to touch. Approx 2-3 cm nodule to right posterior neck, tender to touch, no skin breakdown or discoloration.   Neurological:     General: No focal deficit present.     Mental Status: She is alert and oriented to person, place, and time.  Psychiatric:        Mood and Affect: Mood normal.     Labs reviewed: Basic Metabolic Panel: Recent Labs    02/29/24 1716 03/02/24 0810 03/03/24 0618 03/04/24 0301 03/05/24 0243 03/13/24 1403  NA 138 136 136 136 137 141  K 3.6 4.1 4.3 4.0 4.0 4.0  CL 109 103 106 104 104 106  CO2 18* 23 22 21* 25 25  GLUCOSE 112* 84 93 88 93 92  BUN 17 13 12 19 18  13  CREATININE 0.96 0.83 0.86 0.93 1.16* 0.90  CALCIUM  9.1 8.9 8.8* 9.5 9.0 9.3  MG 1.6* 1.8 1.8 1.9  --   --   TSH 2.067  --   --   --   --   --    Liver Function Tests: Recent Labs    09/27/23 2123 10/17/23 0907 11/05/23 0925 02/29/24 1716 03/13/24 1403  AST 123*   < > 33 23 19  ALT 204*   < > 55* 17 21  ALKPHOS 319*  --   --  96 126  BILITOT 0.5   < > 0.4 0.5 <0.2  PROT 8.1   < > 6.8 7.5 7.8  ALBUMIN 4.5  --   --  3.8 4.4   < > = values in this interval not displayed.   Recent Labs    02/29/24 2208 03/13/24 1403  LIPASE 29 45   No results for input(s): AMMONIA in the last 8760 hours. CBC: Recent Labs    09/27/23 2123  01/29/24 0930 02/23/24 1534 02/29/24 1716 03/13/24 1403  WBC 8.1   < > 5.7 5.5 5.5  NEUTROABS 4.0  --  2.8 2.6  --   HGB 13.8   < > 13.1 13.0 12.8  HCT 44.1   < > 42.7 41.6 40.9  MCV 70.7*   < > 73.0* 72.7* 72.6*  PLT 230   < > 299 272 204   < > = values in this interval not displayed.   Lipid Panel: Recent Labs    10/17/23 0907 11/05/23 0925  CHOL 122 127  HDL 53 49*  LDLCALC 55 63  TRIG 62 66  CHOLHDL 2.3 2.6   TSH: Recent Labs    02/29/24 1716  TSH 2.067   A1C: Lab Results  Component Value Date   HGBA1C 5.9 (H) 01/29/2024     Assessment/Plan 1. PAF (paroxysmal atrial fibrillation) (HCC) (Primary) - TSH 2.067 02/29/2024 - followed by cardiology - s/p ablation 03/09/2024 by Dr. Almetta  - cont amiodarone  and propranolol  for rate control - cont Eliquis  for clot prevention  - amiodarone  (PACERONE ) 200 MG tablet; Take 2 tablets (400 mg total) by mouth daily.  Dispense: 90 tablet; Refill: 2 - propranolol  (INDERAL ) 40 MG tablet; Take 1 tablet (40 mg total) by mouth 2 (two) times daily.  Dispense: 180 tablet; Refill: 2  2. Essential hypertension - uncontrolled, goal < 130/80 due to past MI/CVA - ran out of propranolol  and losartan  past few days - cont low sodium diet  - losartan  (COZAAR ) 50 MG tablet; Take 1 tablet (50 mg total) by mouth daily.  Dispense: 90 tablet; Refill: 2  3. Hidradenitis suppurativa of multiple sites - suspect HD on exam  - will try round of doxycycline   - recommend weight loss efforts, low fat diet and avoidance of restrictive clothing  - doxycycline  (VIBRA -TABS) 100 MG tablet; Take 1 tablet (100 mg total) by mouth 2 (two) times daily for 14 days.  Dispense: 28 tablet; Refill: 0  4. Type 2 diabetes mellitus with cardiac complication (HCC) - followed by endocrinology - foot exam done - needs urine microalbumin next visit> unable to urinate today  - cont Ozempic   5. ICD (implantable cardioverter-defibrillator) in place  6. Chronic  pain of left knee - followed by Dr. Jerri - declined left knee arthroplasty - scheduled to have gel injection soon  - not on pain medication    Total time: 33 minutes. Greater than 50% of total time spent doing  patient education regarding T2DM, PAF, HTN, left knee pain and HD including symptom/medication management.     Next appt: 08/20/2024  Greig Cluster, ELNITA  [facility] [phone]      [1]  Allergies Allergen Reactions   Latex Hives   Other Other (See Comments), Anaphylaxis, Swelling and Hives    Hair glue causes throat to close and hives glue Hair glue causes throat to close    Codeine  Nausea And Vomiting    Was on an empty stomach.   Nitrofurantoin Rash   "

## 2024-05-21 NOTE — Patient Instructions (Addendum)
 You can get covid vaccine at local pharmacy  May try doxycycline  for painful nodules> this may not go away> please read information attached to this packet

## 2024-05-22 ENCOUNTER — Encounter: Admitting: Psychology

## 2024-05-25 ENCOUNTER — Encounter: Admitting: Psychology

## 2024-05-26 ENCOUNTER — Ambulatory Visit: Admitting: Orthopaedic Surgery

## 2024-05-28 ENCOUNTER — Encounter: Admitting: Psychology

## 2024-05-28 ENCOUNTER — Encounter

## 2024-06-02 ENCOUNTER — Ambulatory Visit: Admitting: Orthopaedic Surgery

## 2024-06-03 ENCOUNTER — Encounter: Admitting: Psychology

## 2024-06-09 ENCOUNTER — Encounter: Admitting: Psychology

## 2024-06-10 ENCOUNTER — Ambulatory Visit: Admitting: Student

## 2024-07-15 ENCOUNTER — Encounter: Admitting: Physical Medicine & Rehabilitation

## 2024-07-16 ENCOUNTER — Ambulatory Visit (HOSPITAL_COMMUNITY): Admitting: Psychiatry

## 2024-07-22 ENCOUNTER — Encounter: Admitting: Physical Medicine & Rehabilitation

## 2024-07-23 ENCOUNTER — Encounter

## 2024-08-18 ENCOUNTER — Ambulatory Visit: Admitting: Dermatology

## 2024-08-20 ENCOUNTER — Ambulatory Visit: Admitting: Orthopedic Surgery

## 2024-10-22 ENCOUNTER — Encounter

## 2025-01-21 ENCOUNTER — Encounter

## 2025-04-22 ENCOUNTER — Encounter

## 2025-07-22 ENCOUNTER — Encounter
